# Patient Record
Sex: Female | Born: 1960
Health system: Southern US, Community
[De-identification: ages and names within clinical notes are randomized; demographics above are authoritative.]

## PROBLEM LIST (undated history)

## (undated) DIAGNOSIS — R0600 Dyspnea, unspecified: Secondary | ICD-10-CM

## (undated) DIAGNOSIS — E213 Hyperparathyroidism, unspecified: Secondary | ICD-10-CM

## (undated) DIAGNOSIS — T8859XA Other complications of anesthesia, initial encounter: Secondary | ICD-10-CM

## (undated) DIAGNOSIS — G8929 Other chronic pain: Secondary | ICD-10-CM

## (undated) DIAGNOSIS — N186 End stage renal disease: Secondary | ICD-10-CM

## (undated) DIAGNOSIS — K5900 Constipation, unspecified: Secondary | ICD-10-CM

## (undated) DIAGNOSIS — R600 Localized edema: Secondary | ICD-10-CM

## (undated) DIAGNOSIS — F419 Anxiety disorder, unspecified: Secondary | ICD-10-CM

## (undated) DIAGNOSIS — J309 Allergic rhinitis, unspecified: Secondary | ICD-10-CM

## (undated) DIAGNOSIS — N189 Chronic kidney disease, unspecified: Secondary | ICD-10-CM

## (undated) DIAGNOSIS — E1121 Type 2 diabetes mellitus with diabetic nephropathy: Secondary | ICD-10-CM

## (undated) DIAGNOSIS — N63 Unspecified lump in unspecified breast: Secondary | ICD-10-CM

## (undated) DIAGNOSIS — E119 Type 2 diabetes mellitus without complications: Secondary | ICD-10-CM

## (undated) DIAGNOSIS — E785 Hyperlipidemia, unspecified: Secondary | ICD-10-CM

## (undated) DIAGNOSIS — D649 Anemia, unspecified: Secondary | ICD-10-CM

## (undated) DIAGNOSIS — M549 Dorsalgia, unspecified: Secondary | ICD-10-CM

## (undated) DIAGNOSIS — R112 Nausea with vomiting, unspecified: Secondary | ICD-10-CM

## (undated) DIAGNOSIS — I10 Essential (primary) hypertension: Secondary | ICD-10-CM

## (undated) DIAGNOSIS — Z9889 Other specified postprocedural states: Secondary | ICD-10-CM

## (undated) DIAGNOSIS — M2550 Pain in unspecified joint: Secondary | ICD-10-CM

## (undated) DIAGNOSIS — S82831A Other fracture of upper and lower end of right fibula, initial encounter for closed fracture: Secondary | ICD-10-CM

## (undated) DIAGNOSIS — K219 Gastro-esophageal reflux disease without esophagitis: Secondary | ICD-10-CM

## (undated) DIAGNOSIS — Z89512 Acquired absence of left leg below knee: Secondary | ICD-10-CM

## (undated) DIAGNOSIS — I82409 Acute embolism and thrombosis of unspecified deep veins of unspecified lower extremity: Secondary | ICD-10-CM

## (undated) DIAGNOSIS — M1990 Unspecified osteoarthritis, unspecified site: Secondary | ICD-10-CM

## (undated) DIAGNOSIS — T7840XA Allergy, unspecified, initial encounter: Secondary | ICD-10-CM

## (undated) DIAGNOSIS — R011 Cardiac murmur, unspecified: Secondary | ICD-10-CM

## (undated) DIAGNOSIS — M14672 Charcot's joint, left ankle and foot: Secondary | ICD-10-CM

## (undated) DIAGNOSIS — I890 Lymphedema, not elsewhere classified: Secondary | ICD-10-CM

## (undated) DIAGNOSIS — Z8489 Family history of other specified conditions: Secondary | ICD-10-CM

## (undated) DIAGNOSIS — R001 Bradycardia, unspecified: Secondary | ICD-10-CM

## (undated) DIAGNOSIS — Z992 Dependence on renal dialysis: Secondary | ICD-10-CM

## (undated) DIAGNOSIS — H548 Legal blindness, as defined in USA: Secondary | ICD-10-CM

## (undated) DIAGNOSIS — N183 Chronic kidney disease, stage 3 unspecified: Secondary | ICD-10-CM

## (undated) DIAGNOSIS — E669 Obesity, unspecified: Secondary | ICD-10-CM

## (undated) DIAGNOSIS — I509 Heart failure, unspecified: Secondary | ICD-10-CM

## (undated) DIAGNOSIS — B351 Tinea unguium: Secondary | ICD-10-CM

## (undated) HISTORY — PX: VAGINAL HYSTERECTOMY: SHX2639

## (undated) HISTORY — DX: Hyperlipidemia, unspecified: E78.5

## (undated) HISTORY — PX: ABDOMINAL HYSTERECTOMY: SHX81

## (undated) HISTORY — DX: Type 2 diabetes mellitus with diabetic nephropathy: E11.21

## (undated) HISTORY — PX: CATARACT EXTRACTION: SUR2

## (undated) HISTORY — DX: Bradycardia, unspecified: R00.1

## (undated) HISTORY — DX: Allergy, unspecified, initial encounter: T78.40XA

## (undated) HISTORY — DX: Tinea unguium: B35.1

## (undated) HISTORY — DX: Dyspnea, unspecified: R06.00

## (undated) HISTORY — DX: Unspecified osteoarthritis, unspecified site: M19.90

## (undated) HISTORY — DX: Anxiety disorder, unspecified: F41.9

## (undated) HISTORY — DX: Localized edema: R60.0

## (undated) HISTORY — DX: Heart failure, unspecified: I50.9

## (undated) HISTORY — DX: Constipation, unspecified: K59.00

## (undated) HISTORY — DX: Dorsalgia, unspecified: M54.9

## (undated) HISTORY — DX: Hyperparathyroidism, unspecified: E21.3

## (undated) HISTORY — DX: Anemia, unspecified: D64.9

## (undated) HISTORY — PX: EYE SURGERY: SHX253

## (undated) HISTORY — DX: Chronic kidney disease, unspecified: N18.9

## (undated) HISTORY — DX: Lymphedema, not elsewhere classified: I89.0

## (undated) HISTORY — DX: Pain in unspecified joint: M25.50

## (undated) HISTORY — DX: Obesity, unspecified: E66.9

## (undated) HISTORY — DX: Allergic rhinitis, unspecified: J30.9

## (undated) HISTORY — DX: Gastro-esophageal reflux disease without esophagitis: K21.9

---

## 2002-12-05 ENCOUNTER — Emergency Department (HOSPITAL_COMMUNITY): Admission: EM | Admit: 2002-12-05 | Discharge: 2002-12-05 | Payer: Self-pay

## 2003-09-05 ENCOUNTER — Other Ambulatory Visit: Payer: Self-pay

## 2004-08-04 ENCOUNTER — Ambulatory Visit: Payer: Self-pay | Admitting: Family Medicine

## 2004-08-05 ENCOUNTER — Ambulatory Visit: Payer: Self-pay | Admitting: Family Medicine

## 2005-09-22 ENCOUNTER — Ambulatory Visit: Payer: Self-pay | Admitting: Internal Medicine

## 2005-09-22 ENCOUNTER — Inpatient Hospital Stay (HOSPITAL_COMMUNITY): Admission: EM | Admit: 2005-09-22 | Discharge: 2005-09-24 | Payer: Self-pay | Admitting: Emergency Medicine

## 2005-11-04 HISTORY — PX: EYE SURGERY: SHX253

## 2006-05-31 ENCOUNTER — Other Ambulatory Visit: Payer: Self-pay

## 2006-05-31 ENCOUNTER — Inpatient Hospital Stay: Payer: Self-pay | Admitting: Internal Medicine

## 2006-06-11 ENCOUNTER — Ambulatory Visit: Payer: Self-pay | Admitting: Ophthalmology

## 2006-06-18 ENCOUNTER — Ambulatory Visit: Payer: Self-pay | Admitting: Ophthalmology

## 2006-08-06 ENCOUNTER — Ambulatory Visit: Payer: Self-pay | Admitting: Ophthalmology

## 2006-08-23 ENCOUNTER — Emergency Department: Payer: Self-pay | Admitting: Emergency Medicine

## 2006-08-23 ENCOUNTER — Other Ambulatory Visit: Payer: Self-pay

## 2006-09-18 ENCOUNTER — Other Ambulatory Visit: Payer: Self-pay

## 2006-09-18 ENCOUNTER — Emergency Department: Payer: Self-pay

## 2007-03-11 ENCOUNTER — Ambulatory Visit: Payer: Self-pay | Admitting: Family Medicine

## 2007-03-19 ENCOUNTER — Ambulatory Visit: Payer: Self-pay | Admitting: Family Medicine

## 2007-04-03 ENCOUNTER — Ambulatory Visit: Payer: Self-pay | Admitting: Family Medicine

## 2007-05-28 ENCOUNTER — Ambulatory Visit: Payer: Self-pay | Admitting: Family Medicine

## 2007-07-09 DIAGNOSIS — I509 Heart failure, unspecified: Secondary | ICD-10-CM

## 2007-07-09 HISTORY — DX: Heart failure, unspecified: I50.9

## 2007-09-30 ENCOUNTER — Inpatient Hospital Stay: Payer: Self-pay | Admitting: *Deleted

## 2007-10-08 ENCOUNTER — Ambulatory Visit: Payer: Self-pay | Admitting: Gastroenterology

## 2007-12-11 ENCOUNTER — Ambulatory Visit: Payer: Self-pay | Admitting: Family Medicine

## 2008-02-02 ENCOUNTER — Ambulatory Visit: Payer: Self-pay | Admitting: Family Medicine

## 2008-05-17 ENCOUNTER — Observation Stay: Payer: Self-pay | Admitting: Internal Medicine

## 2008-05-17 ENCOUNTER — Other Ambulatory Visit: Payer: Self-pay

## 2009-02-19 ENCOUNTER — Emergency Department: Payer: Self-pay | Admitting: Emergency Medicine

## 2009-04-12 ENCOUNTER — Ambulatory Visit: Payer: Self-pay | Admitting: Family Medicine

## 2009-04-13 ENCOUNTER — Ambulatory Visit: Payer: Self-pay | Admitting: Family Medicine

## 2009-05-17 ENCOUNTER — Emergency Department: Payer: Self-pay | Admitting: Emergency Medicine

## 2010-03-08 ENCOUNTER — Inpatient Hospital Stay: Payer: Self-pay | Admitting: *Deleted

## 2010-06-11 ENCOUNTER — Ambulatory Visit: Payer: Self-pay | Admitting: Family Medicine

## 2011-09-12 ENCOUNTER — Emergency Department: Payer: Self-pay | Admitting: *Deleted

## 2011-10-21 ENCOUNTER — Emergency Department: Payer: Self-pay | Admitting: *Deleted

## 2012-04-22 ENCOUNTER — Ambulatory Visit: Payer: Self-pay | Admitting: Family Medicine

## 2012-08-05 ENCOUNTER — Ambulatory Visit: Payer: Self-pay | Admitting: Family Medicine

## 2012-08-21 ENCOUNTER — Emergency Department: Payer: Self-pay | Admitting: Emergency Medicine

## 2012-08-21 LAB — COMPREHENSIVE METABOLIC PANEL
Albumin: 3.2 g/dL — ABNORMAL LOW (ref 3.4–5.0)
Alkaline Phosphatase: 122 U/L (ref 50–136)
Anion Gap: 12 (ref 7–16)
BUN: 21 mg/dL — ABNORMAL HIGH (ref 7–18)
Bilirubin,Total: 0.4 mg/dL (ref 0.2–1.0)
Calcium, Total: 8.4 mg/dL — ABNORMAL LOW (ref 8.5–10.1)
Chloride: 107 mmol/L (ref 98–107)
Co2: 21 mmol/L (ref 21–32)
Creatinine: 1.5 mg/dL — ABNORMAL HIGH (ref 0.60–1.30)
EGFR (African American): 46 — ABNORMAL LOW
EGFR (Non-African Amer.): 40 — ABNORMAL LOW
Glucose: 189 mg/dL — ABNORMAL HIGH (ref 65–99)
Osmolality: 287 (ref 275–301)
Potassium: 4 mmol/L (ref 3.5–5.1)
SGOT(AST): 25 U/L (ref 15–37)
SGPT (ALT): 18 U/L (ref 12–78)
Sodium: 140 mmol/L (ref 136–145)
Total Protein: 7.7 g/dL (ref 6.4–8.2)

## 2012-08-21 LAB — CBC
HCT: 34 % — ABNORMAL LOW (ref 35.0–47.0)
HGB: 11.3 g/dL — ABNORMAL LOW (ref 12.0–16.0)
MCH: 28 pg (ref 26.0–34.0)
MCHC: 33.3 g/dL (ref 32.0–36.0)
MCV: 84 fL (ref 80–100)
Platelet: 282 10*3/uL (ref 150–440)
RBC: 4.04 10*6/uL (ref 3.80–5.20)
RDW: 14.4 % (ref 11.5–14.5)
WBC: 8.4 10*3/uL (ref 3.6–11.0)

## 2012-08-21 LAB — CK TOTAL AND CKMB (NOT AT ARMC)
CK, Total: 145 U/L (ref 21–215)
CK-MB: 1 ng/mL (ref 0.5–3.6)

## 2012-08-21 LAB — TROPONIN I: Troponin-I: <0.02 ng/mL

## 2012-08-21 LAB — PRO B NATRIURETIC PEPTIDE: B-Type Natriuretic Peptide: 535 pg/mL — ABNORMAL HIGH (ref 0–125)

## 2012-11-04 HISTORY — PX: BREAST BIOPSY: SHX20

## 2012-11-27 ENCOUNTER — Ambulatory Visit: Payer: Self-pay | Admitting: Nephrology

## 2012-12-04 ENCOUNTER — Ambulatory Visit: Payer: Self-pay | Admitting: Internal Medicine

## 2012-12-04 LAB — HEPATIC FUNCTION PANEL A (ARMC)
Albumin: 3 g/dL — ABNORMAL LOW (ref 3.4–5.0)
Alkaline Phosphatase: 90 U/L (ref 50–136)
Bilirubin, Direct: <0.05 mg/dL (ref 0.00–0.20)
Bilirubin,Total: 0.3 mg/dL (ref 0.2–1.0)
SGOT(AST): 19 U/L (ref 15–37)
SGPT (ALT): 18 U/L (ref 12–78)
Total Protein: 7.2 g/dL (ref 6.4–8.2)

## 2012-12-04 LAB — RETICULOCYTES
Absolute Retic Count: 0.0575 10*6/uL (ref 0.023–0.096)
Reticulocyte: 1.63 % (ref 0.5–2.2)

## 2012-12-04 LAB — IRON AND TIBC
Iron Bind.Cap.(Total): 229 ug/dL — ABNORMAL LOW (ref 250–450)
Iron Saturation: 17 %
Iron: 40 ug/dL — ABNORMAL LOW (ref 50–170)
Unbound Iron-Bind.Cap.: 189 ug/dL

## 2012-12-04 LAB — CBC CANCER CENTER
Basophil #: 0.1 x10 3/mm (ref 0.0–0.1)
Basophil %: 0.8 %
Eosinophil #: 0.1 x10 3/mm (ref 0.0–0.7)
Eosinophil %: 1.6 %
HCT: 28.9 % — ABNORMAL LOW (ref 35.0–47.0)
HGB: 9.4 g/dL — ABNORMAL LOW (ref 12.0–16.0)
Lymphocyte #: 2.4 x10 3/mm (ref 1.0–3.6)
Lymphocyte %: 33.8 %
MCH: 26.8 pg (ref 26.0–34.0)
MCHC: 32.6 g/dL (ref 32.0–36.0)
MCV: 82 fL (ref 80–100)
Monocyte #: 0.4 x10 3/mm (ref 0.2–0.9)
Monocyte %: 6.3 %
Neutrophil #: 4 x10 3/mm (ref 1.4–6.5)
Neutrophil %: 57.5 %
Platelet: 224 x10 3/mm (ref 150–440)
RBC: 3.52 10*6/uL — ABNORMAL LOW (ref 3.80–5.20)
RDW: 14.1 % (ref 11.5–14.5)
WBC: 7 x10 3/mm (ref 3.6–11.0)

## 2012-12-04 LAB — CREATININE, SERUM
Creatinine: 1.61 mg/dL — ABNORMAL HIGH (ref 0.60–1.30)
EGFR (African American): 42 — ABNORMAL LOW
EGFR (Non-African Amer.): 37 — ABNORMAL LOW

## 2012-12-04 LAB — FERRITIN: Ferritin (ARMC): 104 ng/mL (ref 8–388)

## 2012-12-04 LAB — LACTATE DEHYDROGENASE: LDH: 223 U/L (ref 81–246)

## 2012-12-05 ENCOUNTER — Ambulatory Visit: Payer: Self-pay | Admitting: Internal Medicine

## 2012-12-05 ENCOUNTER — Ambulatory Visit: Payer: Self-pay

## 2012-12-07 LAB — PROT IMMUNOELECTROPHORES(ARMC)

## 2012-12-30 ENCOUNTER — Ambulatory Visit: Payer: Self-pay

## 2013-01-02 ENCOUNTER — Ambulatory Visit: Payer: Self-pay | Admitting: Internal Medicine

## 2013-01-13 LAB — CBC CANCER CENTER
Basophil #: 0.1 x10 3/mm (ref 0.0–0.1)
Basophil %: 1.2 %
Eosinophil #: 0.1 x10 3/mm (ref 0.0–0.7)
Eosinophil %: 1.6 %
HCT: 30.4 % — ABNORMAL LOW (ref 35.0–47.0)
HGB: 10 g/dL — ABNORMAL LOW (ref 12.0–16.0)
Lymphocyte #: 2.6 x10 3/mm (ref 1.0–3.6)
Lymphocyte %: 36.2 %
MCH: 27.1 pg (ref 26.0–34.0)
MCHC: 32.7 g/dL (ref 32.0–36.0)
MCV: 83 fL (ref 80–100)
Monocyte #: 0.5 x10 3/mm (ref 0.2–0.9)
Monocyte %: 6.8 %
Neutrophil #: 3.9 x10 3/mm (ref 1.4–6.5)
Neutrophil %: 54.2 %
Platelet: 203 x10 3/mm (ref 150–440)
RBC: 3.67 10*6/uL — ABNORMAL LOW (ref 3.80–5.20)
RDW: 14.6 % — ABNORMAL HIGH (ref 11.5–14.5)
WBC: 7.2 x10 3/mm (ref 3.6–11.0)

## 2013-01-27 DIAGNOSIS — D638 Anemia in other chronic diseases classified elsewhere: Secondary | ICD-10-CM | POA: Insufficient documentation

## 2013-02-02 ENCOUNTER — Ambulatory Visit: Payer: Self-pay | Admitting: Internal Medicine

## 2013-03-02 ENCOUNTER — Emergency Department: Payer: Self-pay | Admitting: Emergency Medicine

## 2013-03-04 ENCOUNTER — Ambulatory Visit: Payer: Self-pay | Admitting: Internal Medicine

## 2013-03-30 ENCOUNTER — Inpatient Hospital Stay: Payer: Self-pay | Admitting: Student

## 2013-03-30 LAB — CK TOTAL AND CKMB (NOT AT ARMC)
CK, Total: 225 U/L — ABNORMAL HIGH (ref 21–215)
CK, Total: 159 U/L (ref 21–215)
CK, Total: 150 U/L (ref 21–215)
CK-MB: 1 ng/mL (ref 0.5–3.6)
CK-MB: 1 ng/mL (ref 0.5–3.6)
CK-MB: 0.9 ng/mL (ref 0.5–3.6)

## 2013-03-30 LAB — TROPONIN I
Troponin-I: <0.02 ng/mL
Troponin-I: <0.02 ng/mL
Troponin-I: <0.02 ng/mL

## 2013-03-30 LAB — BASIC METABOLIC PANEL
Anion Gap: 5 — ABNORMAL LOW (ref 7–16)
BUN: 26 mg/dL — ABNORMAL HIGH (ref 7–18)
Calcium, Total: 8.6 mg/dL (ref 8.5–10.1)
Chloride: 109 mmol/L — ABNORMAL HIGH (ref 98–107)
Co2: 26 mmol/L (ref 21–32)
Creatinine: 1.63 mg/dL — ABNORMAL HIGH (ref 0.60–1.30)
EGFR (African American): 42 — ABNORMAL LOW
EGFR (Non-African Amer.): 36 — ABNORMAL LOW
Glucose: 249 mg/dL — ABNORMAL HIGH (ref 65–99)
Osmolality: 293 (ref 275–301)
Potassium: 3.5 mmol/L (ref 3.5–5.1)
Sodium: 140 mmol/L (ref 136–145)

## 2013-03-30 LAB — CBC
HCT: 29.9 % — ABNORMAL LOW (ref 35.0–47.0)
HGB: 9.8 g/dL — ABNORMAL LOW (ref 12.0–16.0)
MCH: 27.1 pg (ref 26.0–34.0)
MCHC: 32.7 g/dL (ref 32.0–36.0)
MCV: 83 fL (ref 80–100)
Platelet: 219 10*3/uL (ref 150–440)
RBC: 3.61 10*6/uL — ABNORMAL LOW (ref 3.80–5.20)
RDW: 14.5 % (ref 11.5–14.5)
WBC: 8.7 10*3/uL (ref 3.6–11.0)

## 2013-03-30 LAB — HEMOGLOBIN A1C: Hemoglobin A1C: 9 % — ABNORMAL HIGH (ref 4.2–6.3)

## 2013-03-30 LAB — PRO B NATRIURETIC PEPTIDE: B-Type Natriuretic Peptide: 742 pg/mL — ABNORMAL HIGH (ref 0–125)

## 2013-03-31 LAB — CBC WITH DIFFERENTIAL/PLATELET
Basophil #: 0 10*3/uL (ref 0.0–0.1)
Basophil %: 0.8 %
Eosinophil #: 0.1 10*3/uL (ref 0.0–0.7)
Eosinophil %: 1.8 %
HCT: 25.1 % — ABNORMAL LOW (ref 35.0–47.0)
HGB: 8.3 g/dL — ABNORMAL LOW (ref 12.0–16.0)
Lymphocyte #: 2.2 10*3/uL (ref 1.0–3.6)
Lymphocyte %: 35.2 %
MCH: 27.4 pg (ref 26.0–34.0)
MCHC: 33.1 g/dL (ref 32.0–36.0)
MCV: 83 fL (ref 80–100)
Monocyte #: 0.6 x10 3/mm (ref 0.2–0.9)
Monocyte %: 8.7 %
Neutrophil #: 3.4 10*3/uL (ref 1.4–6.5)
Neutrophil %: 53.5 %
Platelet: 189 10*3/uL (ref 150–440)
RBC: 3.02 10*6/uL — ABNORMAL LOW (ref 3.80–5.20)
RDW: 14.6 % — ABNORMAL HIGH (ref 11.5–14.5)
WBC: 6.3 10*3/uL (ref 3.6–11.0)

## 2013-03-31 LAB — BASIC METABOLIC PANEL
Anion Gap: 5 — ABNORMAL LOW (ref 7–16)
BUN: 24 mg/dL — ABNORMAL HIGH (ref 7–18)
Calcium, Total: 8.1 mg/dL — ABNORMAL LOW (ref 8.5–10.1)
Chloride: 110 mmol/L — ABNORMAL HIGH (ref 98–107)
Co2: 27 mmol/L (ref 21–32)
Creatinine: 1.5 mg/dL — ABNORMAL HIGH (ref 0.60–1.30)
EGFR (African American): 46 — ABNORMAL LOW
EGFR (Non-African Amer.): 40 — ABNORMAL LOW
Glucose: 157 mg/dL — ABNORMAL HIGH (ref 65–99)
Osmolality: 290 (ref 275–301)
Potassium: 3.7 mmol/L (ref 3.5–5.1)
Sodium: 142 mmol/L (ref 136–145)

## 2013-04-01 LAB — BASIC METABOLIC PANEL
Anion Gap: 4 — ABNORMAL LOW (ref 7–16)
BUN: 19 mg/dL — ABNORMAL HIGH (ref 7–18)
Calcium, Total: 8.9 mg/dL (ref 8.5–10.1)
Chloride: 108 mmol/L — ABNORMAL HIGH (ref 98–107)
Co2: 29 mmol/L (ref 21–32)
Creatinine: 1.49 mg/dL — ABNORMAL HIGH (ref 0.60–1.30)
EGFR (African American): 47 — ABNORMAL LOW
EGFR (Non-African Amer.): 40 — ABNORMAL LOW
Glucose: 152 mg/dL — ABNORMAL HIGH (ref 65–99)
Osmolality: 286 (ref 275–301)
Potassium: 3.7 mmol/L (ref 3.5–5.1)
Sodium: 141 mmol/L (ref 136–145)

## 2013-04-02 LAB — BASIC METABOLIC PANEL
Anion Gap: 3 — ABNORMAL LOW (ref 7–16)
BUN: 19 mg/dL — ABNORMAL HIGH (ref 7–18)
Calcium, Total: 8.6 mg/dL (ref 8.5–10.1)
Chloride: 109 mmol/L — ABNORMAL HIGH (ref 98–107)
Co2: 29 mmol/L (ref 21–32)
Creatinine: 1.53 mg/dL — ABNORMAL HIGH (ref 0.60–1.30)
EGFR (African American): 45 — ABNORMAL LOW
EGFR (Non-African Amer.): 39 — ABNORMAL LOW
Glucose: 157 mg/dL — ABNORMAL HIGH (ref 65–99)
Osmolality: 287 (ref 275–301)
Potassium: 3.8 mmol/L (ref 3.5–5.1)
Sodium: 141 mmol/L (ref 136–145)

## 2013-04-05 DIAGNOSIS — K219 Gastro-esophageal reflux disease without esophagitis: Secondary | ICD-10-CM | POA: Insufficient documentation

## 2013-05-03 ENCOUNTER — Ambulatory Visit: Payer: Self-pay | Admitting: General Surgery

## 2013-05-05 ENCOUNTER — Ambulatory Visit: Payer: Self-pay | Admitting: General Surgery

## 2013-05-14 DIAGNOSIS — R001 Bradycardia, unspecified: Secondary | ICD-10-CM | POA: Insufficient documentation

## 2013-06-17 ENCOUNTER — Encounter: Payer: Self-pay | Admitting: *Deleted

## 2013-08-12 ENCOUNTER — Emergency Department: Payer: Self-pay | Admitting: Emergency Medicine

## 2013-10-04 ENCOUNTER — Ambulatory Visit: Payer: Self-pay | Admitting: Family Medicine

## 2013-10-21 ENCOUNTER — Observation Stay: Payer: Self-pay | Admitting: Internal Medicine

## 2013-10-21 LAB — CK TOTAL AND CKMB (NOT AT ARMC)
CK, Total: 224 U/L — ABNORMAL HIGH (ref 21–215)
CK-MB: 1.9 ng/mL (ref 0.5–3.6)

## 2013-10-21 LAB — CBC
HCT: 32.8 % — ABNORMAL LOW (ref 35.0–47.0)
HGB: 10.2 g/dL — ABNORMAL LOW (ref 12.0–16.0)
MCH: 26.3 pg (ref 26.0–34.0)
MCHC: 31 g/dL — ABNORMAL LOW (ref 32.0–36.0)
MCV: 85 fL (ref 80–100)
Platelet: 225 10*3/uL (ref 150–440)
RBC: 3.87 10*6/uL (ref 3.80–5.20)
RDW: 14.2 % (ref 11.5–14.5)
WBC: 6.4 10*3/uL (ref 3.6–11.0)

## 2013-10-21 LAB — BASIC METABOLIC PANEL
Anion Gap: 6 — ABNORMAL LOW (ref 7–16)
Anion Gap: 5 — ABNORMAL LOW (ref 7–16)
BUN: 37 mg/dL — ABNORMAL HIGH (ref 7–18)
BUN: 37 mg/dL — ABNORMAL HIGH (ref 7–18)
Calcium, Total: 9 mg/dL (ref 8.5–10.1)
Calcium, Total: 8.9 mg/dL (ref 8.5–10.1)
Chloride: 106 mmol/L (ref 98–107)
Chloride: 108 mmol/L — ABNORMAL HIGH (ref 98–107)
Co2: 23 mmol/L (ref 21–32)
Co2: 24 mmol/L (ref 21–32)
Creatinine: 2.03 mg/dL — ABNORMAL HIGH (ref 0.60–1.30)
Creatinine: 1.96 mg/dL — ABNORMAL HIGH (ref 0.60–1.30)
EGFR (African American): 32 — ABNORMAL LOW
EGFR (African American): 33 — ABNORMAL LOW
EGFR (Non-African Amer.): 27 — ABNORMAL LOW
EGFR (Non-African Amer.): 29 — ABNORMAL LOW
Glucose: 214 mg/dL — ABNORMAL HIGH (ref 65–99)
Glucose: 156 mg/dL — ABNORMAL HIGH (ref 65–99)
Osmolality: 285 (ref 275–301)
Osmolality: 286 (ref 275–301)
Potassium: 5.3 mmol/L — ABNORMAL HIGH (ref 3.5–5.1)
Potassium: 4.9 mmol/L (ref 3.5–5.1)
Sodium: 135 mmol/L — ABNORMAL LOW (ref 136–145)
Sodium: 137 mmol/L (ref 136–145)

## 2013-10-21 LAB — TROPONIN I
Troponin-I: <0.02 ng/mL
Troponin-I: <0.02 ng/mL

## 2013-10-22 LAB — TROPONIN I: Troponin-I: <0.02 ng/mL

## 2013-10-22 LAB — BASIC METABOLIC PANEL
Anion Gap: 3 — ABNORMAL LOW (ref 7–16)
BUN: 36 mg/dL — ABNORMAL HIGH (ref 7–18)
Calcium, Total: 8.3 mg/dL — ABNORMAL LOW (ref 8.5–10.1)
Chloride: 111 mmol/L — ABNORMAL HIGH (ref 98–107)
Co2: 26 mmol/L (ref 21–32)
Creatinine: 1.9 mg/dL — ABNORMAL HIGH (ref 0.60–1.30)
EGFR (African American): 35 — ABNORMAL LOW
EGFR (Non-African Amer.): 30 — ABNORMAL LOW
Glucose: 134 mg/dL — ABNORMAL HIGH (ref 65–99)
Osmolality: 290 (ref 275–301)
Potassium: 4.8 mmol/L (ref 3.5–5.1)
Sodium: 140 mmol/L (ref 136–145)

## 2013-10-22 LAB — LIPID PANEL
Cholesterol: 144 mg/dL (ref 0–200)
HDL Cholesterol: 41 mg/dL (ref 40–60)
Ldl Cholesterol, Calc: 87 mg/dL (ref 0–100)
Triglycerides: 81 mg/dL (ref 0–200)
VLDL Cholesterol, Calc: 16 mg/dL (ref 5–40)

## 2013-10-22 LAB — CK TOTAL AND CKMB (NOT AT ARMC)
CK, Total: 180 U/L (ref 21–215)
CK, Total: 197 U/L (ref 21–215)
CK-MB: 1.5 ng/mL (ref 0.5–3.6)
CK-MB: 1.7 ng/mL (ref 0.5–3.6)

## 2013-11-04 ENCOUNTER — Ambulatory Visit: Payer: Self-pay | Admitting: Family Medicine

## 2013-11-05 DIAGNOSIS — F419 Anxiety disorder, unspecified: Secondary | ICD-10-CM | POA: Insufficient documentation

## 2013-11-19 ENCOUNTER — Observation Stay: Payer: Self-pay | Admitting: Internal Medicine

## 2013-11-19 LAB — PRO B NATRIURETIC PEPTIDE: B-Type Natriuretic Peptide: 185 pg/mL — ABNORMAL HIGH (ref 0–125)

## 2013-11-19 LAB — HEPATIC FUNCTION PANEL A (ARMC)
Albumin: 3.2 g/dL — ABNORMAL LOW (ref 3.4–5.0)
Alkaline Phosphatase: 95 U/L
Bilirubin, Direct: <0.1 mg/dL (ref 0.00–0.20)
Bilirubin,Total: 0.3 mg/dL (ref 0.2–1.0)
SGOT(AST): 19 U/L (ref 15–37)
SGPT (ALT): 20 U/L (ref 12–78)
Total Protein: 7.9 g/dL (ref 6.4–8.2)

## 2013-11-19 LAB — CBC
HCT: 29 % — ABNORMAL LOW (ref 35.0–47.0)
HGB: 9.5 g/dL — ABNORMAL LOW (ref 12.0–16.0)
MCH: 27.4 pg (ref 26.0–34.0)
MCHC: 32.7 g/dL (ref 32.0–36.0)
MCV: 84 fL (ref 80–100)
Platelet: 239 10*3/uL (ref 150–440)
RBC: 3.46 10*6/uL — ABNORMAL LOW (ref 3.80–5.20)
RDW: 14.2 % (ref 11.5–14.5)
WBC: 7.4 10*3/uL (ref 3.6–11.0)

## 2013-11-19 LAB — LIPASE, BLOOD: Lipase: 103 U/L (ref 73–393)

## 2013-11-19 LAB — BASIC METABOLIC PANEL
Anion Gap: 6 — ABNORMAL LOW (ref 7–16)
BUN: 28 mg/dL — ABNORMAL HIGH (ref 7–18)
Calcium, Total: 8.7 mg/dL (ref 8.5–10.1)
Chloride: 106 mmol/L (ref 98–107)
Co2: 24 mmol/L (ref 21–32)
Creatinine: 2.17 mg/dL — ABNORMAL HIGH (ref 0.60–1.30)
EGFR (African American): 29 — ABNORMAL LOW
EGFR (Non-African Amer.): 25 — ABNORMAL LOW
Glucose: 135 mg/dL — ABNORMAL HIGH (ref 65–99)
Osmolality: 279 (ref 275–301)
Potassium: 4.8 mmol/L (ref 3.5–5.1)
Sodium: 136 mmol/L (ref 136–145)

## 2013-11-19 LAB — TROPONIN I
Troponin-I: <0.02 ng/mL
Troponin-I: <0.02 ng/mL
Troponin-I: <0.02 ng/mL

## 2013-11-20 LAB — TSH: Thyroid Stimulating Horm: 1.42 u[IU]/mL

## 2013-11-20 LAB — BASIC METABOLIC PANEL
Anion Gap: 4 — ABNORMAL LOW (ref 7–16)
BUN: 32 mg/dL — ABNORMAL HIGH (ref 7–18)
Calcium, Total: 8.3 mg/dL — ABNORMAL LOW (ref 8.5–10.1)
Chloride: 106 mmol/L (ref 98–107)
Co2: 25 mmol/L (ref 21–32)
Creatinine: 2.22 mg/dL — ABNORMAL HIGH (ref 0.60–1.30)
EGFR (African American): 29 — ABNORMAL LOW
EGFR (Non-African Amer.): 25 — ABNORMAL LOW
Glucose: 138 mg/dL — ABNORMAL HIGH (ref 65–99)
Osmolality: 279 (ref 275–301)
Potassium: 4.8 mmol/L (ref 3.5–5.1)
Sodium: 135 mmol/L — ABNORMAL LOW (ref 136–145)

## 2013-11-20 LAB — HEMOGLOBIN A1C: Hemoglobin A1C: 7.7 % — ABNORMAL HIGH (ref 4.2–6.3)

## 2013-11-20 LAB — LIPID PANEL
Cholesterol: 157 mg/dL (ref 0–200)
HDL Cholesterol: 38 mg/dL — ABNORMAL LOW (ref 40–60)
Ldl Cholesterol, Calc: 96 mg/dL (ref 0–100)
Triglycerides: 114 mg/dL (ref 0–200)
VLDL Cholesterol, Calc: 23 mg/dL (ref 5–40)

## 2013-11-20 LAB — MAGNESIUM: Magnesium: 2 mg/dL

## 2013-11-21 LAB — BASIC METABOLIC PANEL
Anion Gap: 6 — ABNORMAL LOW (ref 7–16)
BUN: 34 mg/dL — ABNORMAL HIGH (ref 7–18)
Calcium, Total: 8.4 mg/dL — ABNORMAL LOW (ref 8.5–10.1)
Chloride: 107 mmol/L (ref 98–107)
Co2: 24 mmol/L (ref 21–32)
Creatinine: 2.15 mg/dL — ABNORMAL HIGH (ref 0.60–1.30)
EGFR (African American): 30 — ABNORMAL LOW
EGFR (Non-African Amer.): 26 — ABNORMAL LOW
Glucose: 95 mg/dL (ref 65–99)
Osmolality: 281 (ref 275–301)
Potassium: 4.6 mmol/L (ref 3.5–5.1)
Sodium: 137 mmol/L (ref 136–145)

## 2014-02-10 ENCOUNTER — Ambulatory Visit: Payer: Self-pay | Admitting: Family Medicine

## 2014-02-22 DIAGNOSIS — J309 Allergic rhinitis, unspecified: Secondary | ICD-10-CM | POA: Insufficient documentation

## 2014-02-22 DIAGNOSIS — B351 Tinea unguium: Secondary | ICD-10-CM | POA: Insufficient documentation

## 2014-02-24 DIAGNOSIS — E213 Hyperparathyroidism, unspecified: Secondary | ICD-10-CM | POA: Insufficient documentation

## 2014-04-15 ENCOUNTER — Emergency Department: Payer: Self-pay | Admitting: Emergency Medicine

## 2014-06-26 DIAGNOSIS — N179 Acute kidney failure, unspecified: Secondary | ICD-10-CM | POA: Insufficient documentation

## 2014-06-26 DIAGNOSIS — N183 Chronic kidney disease, stage 3 unspecified: Secondary | ICD-10-CM | POA: Insufficient documentation

## 2014-07-21 ENCOUNTER — Ambulatory Visit: Payer: Self-pay | Admitting: Internal Medicine

## 2014-08-04 ENCOUNTER — Ambulatory Visit: Payer: Self-pay | Admitting: Internal Medicine

## 2014-08-04 LAB — CBC CANCER CENTER
Basophil #: 0.1 x10 3/mm (ref 0.0–0.1)
Basophil %: 1.2 %
Eosinophil #: 0.4 x10 3/mm (ref 0.0–0.7)
Eosinophil %: 4.4 %
HCT: 33 % — ABNORMAL LOW (ref 35.0–47.0)
HGB: 10.3 g/dL — ABNORMAL LOW (ref 12.0–16.0)
Lymphocyte #: 2.1 x10 3/mm (ref 1.0–3.6)
Lymphocyte %: 25.7 %
MCH: 26.8 pg (ref 26.0–34.0)
MCHC: 31.4 g/dL — ABNORMAL LOW (ref 32.0–36.0)
MCV: 85 fL (ref 80–100)
Monocyte #: 0.5 x10 3/mm (ref 0.2–0.9)
Monocyte %: 6.2 %
Neutrophil #: 5.2 x10 3/mm (ref 1.4–6.5)
Neutrophil %: 62.5 %
Platelet: 221 x10 3/mm (ref 150–440)
RBC: 3.87 10*6/uL (ref 3.80–5.20)
RDW: 15.1 % — ABNORMAL HIGH (ref 11.5–14.5)
WBC: 8.2 x10 3/mm (ref 3.6–11.0)

## 2014-08-04 LAB — IRON AND TIBC
Iron Bind.Cap.(Total): 215 ug/dL — ABNORMAL LOW (ref 250–450)
Iron Saturation: 20 %
Iron: 42 ug/dL — ABNORMAL LOW (ref 50–170)
Unbound Iron-Bind.Cap.: 173 ug/dL

## 2014-08-04 LAB — CREATININE, SERUM
Creatinine: 1.91 mg/dL — ABNORMAL HIGH (ref 0.60–1.30)
EGFR (African American): 35 — ABNORMAL LOW
EGFR (Non-African Amer.): 29 — ABNORMAL LOW

## 2014-08-04 LAB — FERRITIN: Ferritin (ARMC): 176 ng/mL (ref 8–388)

## 2014-08-14 DIAGNOSIS — K529 Noninfective gastroenteritis and colitis, unspecified: Secondary | ICD-10-CM | POA: Insufficient documentation

## 2014-08-14 DIAGNOSIS — I10 Essential (primary) hypertension: Secondary | ICD-10-CM | POA: Insufficient documentation

## 2014-08-14 DIAGNOSIS — N3000 Acute cystitis without hematuria: Secondary | ICD-10-CM | POA: Insufficient documentation

## 2014-09-04 ENCOUNTER — Ambulatory Visit: Payer: Self-pay | Admitting: Internal Medicine

## 2014-09-09 LAB — CREATININE, SERUM
Creatinine: 1.8 mg/dL — ABNORMAL HIGH (ref 0.60–1.30)
EGFR (African American): 38 — ABNORMAL LOW
EGFR (Non-African Amer.): 31 — ABNORMAL LOW

## 2014-09-09 LAB — PHOSPHORUS: Phosphorus: 4.5 mg/dL (ref 2.5–4.9)

## 2014-09-09 LAB — CALCIUM: Calcium, Total: 8.8 mg/dL (ref 8.5–10.1)

## 2014-09-09 LAB — CBC CANCER CENTER
Basophil #: 0.1 x10 3/mm (ref 0.0–0.1)
Basophil %: 1 %
Eosinophil #: 0.4 x10 3/mm (ref 0.0–0.7)
Eosinophil %: 5.8 %
HCT: 29.6 % — ABNORMAL LOW (ref 35.0–47.0)
HGB: 9.4 g/dL — ABNORMAL LOW (ref 12.0–16.0)
Lymphocyte #: 2 x10 3/mm (ref 1.0–3.6)
Lymphocyte %: 30.6 %
MCH: 27.7 pg (ref 26.0–34.0)
MCHC: 31.9 g/dL — ABNORMAL LOW (ref 32.0–36.0)
MCV: 87 fL (ref 80–100)
Monocyte #: 0.5 x10 3/mm (ref 0.2–0.9)
Monocyte %: 7.2 %
Neutrophil #: 3.6 x10 3/mm (ref 1.4–6.5)
Neutrophil %: 55.4 %
Platelet: 210 x10 3/mm (ref 150–440)
RBC: 3.41 10*6/uL — ABNORMAL LOW (ref 3.80–5.20)
RDW: 15.1 % — ABNORMAL HIGH (ref 11.5–14.5)
WBC: 6.4 x10 3/mm (ref 3.6–11.0)

## 2014-10-04 ENCOUNTER — Ambulatory Visit: Payer: Self-pay | Admitting: Internal Medicine

## 2014-10-07 LAB — IRON AND TIBC
Iron Bind.Cap.(Total): 202 ug/dL — ABNORMAL LOW (ref 250–450)
Iron Saturation: 19 %
Iron: 39 ug/dL — ABNORMAL LOW (ref 50–170)
Unbound Iron-Bind.Cap.: 163 ug/dL

## 2014-10-07 LAB — CANCER CENTER HEMOGLOBIN: HGB: 9.3 g/dL — ABNORMAL LOW (ref 12.0–16.0)

## 2014-10-10 LAB — PROT IMMUNOELECTROPHORES(ARMC)

## 2014-10-10 LAB — KAPPA/LAMBDA FREE LIGHT CHAINS (ARMC)

## 2014-11-04 ENCOUNTER — Ambulatory Visit: Payer: Self-pay | Admitting: Internal Medicine

## 2014-12-13 ENCOUNTER — Ambulatory Visit: Payer: Self-pay | Admitting: Internal Medicine

## 2015-02-24 NOTE — H&P (Signed)
PATIENT NAME:  Rachel Holmes, Rachel Holmes MR#:  [id number] DATE OF BIRTH:  November 05, 1960  DATE OF ADMISSION:  10/21/2013  REFERRING PHYSICIAN: Dr. Beather Arbour.   PRIMARY CARE PHYSICIAN: Dr. Salome Holmes.   CHIEF COMPLAINT: Chest pain.   HISTORY OF PRESENT ILLNESS: This is a 54 year old African American female with history of insulin-dependent diabetes poorly controlled with associated retinopathy and neuropathy, diastolic congestive heart failure, hyperlipidemia, hypertension, presenting with 1 day duration of chest pain. Said that she awoke around 9 a.m. and had "eye floaters." This was followed by burning chest pain rated 6 to 7 out of 10 in intensity, retrosternal, nonradiating, no worsening or relieving factors which was constant since around 9 a.m. She had associated palpitations and dyspnea on exertion. She states with all of the above symptoms, she eventually took her blood pressure and noticed it to be 220s/110s. She then presented to the Emergency Department for further workup and evaluation. In the Emergency Department, her blood pressure has improved after the addition of nitro paste and currently, she is asymptomatic, resting comfortably in bed without further complaints.   REVIEW OF SYSTEMS:   CONSTITUTIONAL: Denies fever, fatigue, weakness, pain.  EYES: Blurred vision, double vision, eye pain.  EARS, NOSE, THROAT: Denies tinnitus, ear pain, hearing loss.  RESPIRATORY: Denies cough, wheeze, shortness of breath.  CARDIOVASCULAR: Positive for chest pain as described above. Denies any orthopnea, edema. Positive for palpitations as described above.  GASTROINTESTINAL: Denies nausea, vomiting, diarrhea, abdominal pain.  GENITOURINARY: Denies dysuria, hematuria.  ENDOCRINE: Denies nocturia or polyuria.  HEMATOLOGIC AND LYMPHATIC: Denies easy bruising or bleeding.  SKIN: Denies rash or lesion.  MUSCULOSKELETAL: Denies pain in neck, back, shoulder, knees, hips or arthritic symptoms.  NEUROLOGIC: Positive for  numbness in hands and feet bilaterally which is chronic in nature. Denies any paralysis.  PSYCHIATRIC: Denies any anxiety or depressive symptoms.   Otherwise, full review of systems performed by me is negative.   PAST MEDICAL HISTORY: Insulin-dependent type 2 diabetes complicated by retinopathy and neuropathy, history of DVT approximately 20 years ago, diastolic congestive heart failure, hyperlipidemia, hypertension, chronic kidney disease.   SOCIAL HISTORY: Denies alcohol, tobacco or drug usage.   FAMILY HISTORY: Positive for congestive heart failure.   ALLERGIES: CODEINE, LOVASTATIN, OXYCONTIN, PENICILLIN, PERCOCET, QUINAPRIL AND VICODIN.   HOME MEDICATIONS: Include Norvasc 10 mg p.o. daily, aspirin 325 mg p.o. daily, clonidine 0.3 mg 1 tablet b.i.d., ferrous sulfate 325 mg b.i.d., Lasix 20 mg p.o. daily, gabapentin 600 mg p.o. at bedtime, glipizide 10 mg 2 tablets in the morning and 1 tablet in the evening, hydralazine 100 mg p.o. b.i.d., Imdur 60 mg extended release p.o. daily, Januvia 50 mg p.o. daily, Levemir 36 units subcutaneous injection at bedtime, losartan 100 mg p.o. daily, Prilosec 40 mg p.o. b.i.d.    PHYSICAL EXAMINATION:  VITAL SIGNS: Temperature 97.7, heart rate 91, respirations 18, blood pressure on arrival 226/86, trending down to 185/78, saturating 97% on room air. Weight 136.1 kg, BMI 45.6.  GENERAL: Obese, African American female, currently in no acute distress.  HEAD: Normocephalic, atraumatic.  EYES: Pupils equal, round and reactive to light. Extraocular muscles intact. No scleral icterus.  MOUTH: Moist mucosal membranes. Dentition intact. No abscess noted.  EARS, NOSE, THROAT: Throat clear without exudates. No external lesions.  NECK: Supple. No thyromegaly. No nodules. No JVD.  PULMONARY: Clear to auscultation bilaterally. No wheezes, rubs or rhonchi. No use of accessory muscles. Good respiratory effort.  CHEST: Nontender to palpation.  CARDIOVASCULAR: S1, S2,  regular rate  and rhythm with a 3/6 systolic ejection murmur. Trace pedal edema. Pedal pulses 2+ bilaterally.  GASTROINTESTINAL: Soft, nontender, nondistended. No masses. Positive bowel sounds. No hepatosplenomegaly. Obese.  MUSCULOSKELETAL: No swelling, clubbing. Positive for edema as described above. Range of motion full in all extremities.  NEUROLOGIC: Cranial nerves II through XII intact. No gross focal neurological deficits. Sensation intact. Reflexes intact.  SKIN: No ulcerations, lesions, rashes, cyanosis. Skin warm, dry. Turgor is intact.  PSYCHIATRIC: Mood and affect are within normal limits. Awake, alert, oriented x 3. Insight and judgment intact.   LABORATORY DATA: Sodium 137, potassium 4.9, chloride 108, bicarb 24, BUN 37, creatinine 1.96, glucose 156. Apparent baseline creatinine is around 1.5. Troponin less than 0.02. This is x 2. CK 224, CK-MB 1.9. WBC 6.4, hemoglobin 10.2, platelets of 225. EKG performed revealing normal sinus rhythm, though minimal voltage criteria for LVH. CT head performed revealing no acute intracranial process. Chest x-ray performed revealing no acute cardiopulmonary process.   ASSESSMENT AND PLAN: A 54 year old African American female with history of diabetes poorly controlled, diastolic congestive heart failure, hypertension, hyperlipidemia. Presenting with 1 day duration of chest pain. Found to be markedly hypertensive.  1. Hypertensive urgency: Improved in the Emergency Department after the addition of nitro paste. Will add p.r.n. hydralazine 10 mg intravenous q.4 hours as needed for a systolic blood pressure greater than 99991111 or diastolic blood pressure greater than 100. In the meantime, continue her home doses of p.o. Norvasc, clonidine, hydralazine, losartan and Imdur. If her blood pressure remains elevated and she requires multiple doses of p.r.n. hydralazine intravenous, she will need adjustments of her home medications.  2. Type 2 diabetes, insulin dependent,  poorly controlled, complicated by retinopathy and neuropathy: Continue Levemir. Hold p.o. agents and add insulin sliding scale with q.6 hour Accu-Cheks.  3. Chest pain: Admit to observation on telemetry. Trend cardiac enzymes. This has already been negative x 2. Chest pain seems unlikely to be cardiovascular in nature.  4. Gastroesophageal reflux disease: Continue with pantoprazole.  5. Neuropathy: Continue with gabapentin.  6. Venous thromboembolism prophylaxis with heparin subcutaneous.   The patient is FULL CODE.   TIME SPENT: 45 minutes.   ____________________________ Aaron Mose. Hower, MD dkh:gb D: 10/21/2013 22:15:42 ET T: 10/21/2013 22:33:22 ET JOB#: [id number]  cc: Aaron Mose. Hower, MD, <Dictator> DAVID Woodfin Ganja MD ELECTRONICALLY SIGNED 10/23/2013 2:45

## 2015-02-24 NOTE — Consult Note (Signed)
PATIENT NAME:  Rachel Holmes, Rachel Holmes MR#:  [id number] DATE OF BIRTH:  1961/09/14  DATE OF CONSULTATION:  03/30/2013  CONSULTING PHYSICIAN:  Corey Skains, MD  REQUESTING PHYSICIAN:  Dr. Tressia Miners   REASON FOR CONSULTATION: Unstable angina, congestive heart failure, diabetes, bradycardia and anemia.   CHIEF COMPLAINT: "I'm short of breath."   HISTORY OF PRESENT ILLNESS:  This is a 54 year old female with a history of diastolic dysfunction, congestive heart failure, with chronic kidney disease and significant anemia, who has had substernal chest pain and pressure over the last several days, increasing in nature, consistent with [place] class IV anginal equivalent, with also lower extremity edema and pulmonary edema consistent with [place] Heart Association class IV congestive heart failure. An EKG has shown normal sinus rhythm with nonspecific ST and T-wave changes, and troponin is normal, without evidence of myocardial infarction. The patient has had some bradycardia, likely secondary to metoprolol/clonidine combination. She does have anemia, with a hemoglobin of 9.8, chronic kidney disease, with a creatinine of 1.6, most consistent with causing exacerbation of diastolic dysfunction heart failure. Diabetes and hyperlipidemia have been well- controlled. The remainder of review of systems negative for syncope, dizziness, nausea, diaphoresis, frequent urination, urination at night, muscle weakness, numbness, anxiety, depression, skin lesions, skin rashes, nausea, vomiting, diarrhea.   PAST MEDICAL HISTORY: 1.  Diastolic dysfunction congestive heart failure.  2.  Diabetes.  3.  Hypertension. 4.  Hyperlipidemia.  5.  Chronic kidney disease.  6.  Bradycardia. 7.  Anemia.   FAMILY HISTORY:  Father and sister had early onset of cardiovascular disease.   SOCIAL HISTORY:  She denies alcohol or tobacco use.   ALLERGIES AND MEDICATIONS:  As listed.   PHYSICAL EXAMINATION: VITAL SIGNS:  Blood  pressure is 146/68 bilaterally, heart rate 72 upright, reclining and regular.  GENERAL:  She is a well-appearing, elderly female in no acute distress.  HEAD, EYES, EARS, NOSE, THROAT:  No icterus, thyromegaly, ulcers, hemorrhage or xanthelasma.  CARDIOVASCULAR:  Regular rate and rhythm. Normal S1, S2. A 2/6 apical murmur, consistent with mitral regurgitation. PMI is diffuse. Carotid upstroke normal, without bruit. Jugular venous pressure is normal.  LUNGS:  Have bibasilar crackles and decreased breath sounds.  ABDOMEN: Soft, nontender, without hepatosplenomegaly or masses. Abdominal aorta is normal size, without bruit.  EXTREMITIES:  Show 2+ radial, femoral, dorsal pedal pulses, with 2+ lower extremity edema. No cyanosis, clubbing, ulcers.  NEUROLOGIC:  She is oriented to time, place and person, with normal mood and affect.   ASSESSMENT: This is a 54 year old female with hypertension, hyperlipidemia, diabetes, anemia, bradycardia, chronic kidney disease, diastolic dysfunction, congestive heart failure, without current evidence of myocardial infarction, needing further medication management and treatment options.   RECOMMENDATIONS: 1.  Intravenous Lasix for acute on chronic diastolic dysfunction congestive heart failure.   2.  Further treatment of anemia and chronic kidney disease, likely exacerbating acute on chronic congestive heart failure.   3.  Continue serial ECG and enzymes to assess for possible myocardial infarction.   4.  Echocardiogram for LV systolic dysfunction valvular heart disease, and reassessment of extensive diastolic dysfunction.  5.  Decrease dose of metoprolol, which is likely causing bradycardia, and continue clonidine due to concerns of significant hypertension.   6.  ACE inhibitor if able for chronic kidney disease, watching closely for worsening chronic kidney disease and renal protection from diabetes.   7.  Ambulation, and follow for any other significant symptoms,  but no further intervention, including no need for inpatient  stress test or cardiac catheterization due to normal troponin and no evidence of myocardial infarction.    ____________________________ Corey Skains, MD bjk:mr D: 03/30/2013 18:36:00 ET T: 03/30/2013 20:03:16 ET JOB#: [id number]  cc: Corey Skains, MD, <Dictator> Corey Skains MD ELECTRONICALLY SIGNED 04/06/2013 7:50

## 2015-02-24 NOTE — H&P (Signed)
PATIENT NAME:  Rachel Holmes, Rachel Holmes MR#:  [id number] DATE OF BIRTH:  1961-08-25  DATE OF ADMISSION:  03/30/2013  REFERRING PHYSICIAN: Connye Burkitt. Lovena Le, MD  PRIMARY CARE PHYSICIAN: [facility].   CHIEF COMPLAINT: Cough, shortness of breath, chest heaviness.   HISTORY OF PRESENT ILLNESS: This is a 54 year old female with significant past medical history of diabetes, neuropathy, hypertension, hyperlipidemia, congestive heart failure, who presents with complaints of cough. She reported the cough started this evening, but denies any productive sputum, any nasal discharge. Her chest x-ray did not show an infiltrate. The patient was afebrile, did not have any leukocytosis. As well, reports chest heaviness that has been going on for the last few hours, reports waxes and wanes. No relieving and no provoking factors, nonradiating. As well, she has some shortness of breath, and she reported that she had some wheezing at home. Denies any fever, chills, sweating, runny nose, any headache, dizziness, palpitations or diaphoresis. The patient's first cardiac enzymes were negative. Her EKG does show lateral lead Q wave, which appears to be old. The patient, on presentation, had significantly elevated blood pressure at [id number] systolic blood pressure. The patient received nitro paste, where her blood pressure improved with that, as well her chest heaviness much improved with that as well. The patient denies any such previous episodes of chest pain in the past. Reports she did not have a stress test for a few years. As per her medical record, last cardiac cath she had in 2003, which did not show any significant coronary artery disease. She denies seeing any cardiologist. The patient had mild lower extremity edema, which she reports it is chronic, where she is taking Lasix for that. As well, she reports she has history of congestive heart failure, but I do not have any recent echo on record. It is unclear what kind of heart failure,  if it is systolic or diastolic, but the patient's proBNP was elevated at 742. The patient's last BNP was 189. The patient is known to have history of chronic kidney disease with creatinine of 1.63 today; her baseline is 1.6. The patient was given 324 mg of aspirin in the ED. Hospitalist service was requested to admit the patient for further management and workup of her chest heaviness.  PAST MEDICAL HISTORY:  1. Diabetes mellitus for the last 25 years with retinopathy and neuropathy.  2. Hypertension.  3. Gastroesophageal reflux disease.  4. Anemia.  5. Depression.  6. Chronic kidney disease, with baseline creatinine of 1.6.  7. Cardiac cath in 2003 without significant coronary artery disease.  8. Hysterectomy and bilateral oophorectomy.  9. Status post tubal litigation.   HOME MEDICATIONS:  1. Aspirin 81 mg daily.  2. Losartan 100 mg oral daily.  3. Clonidine 0.3 mg oral 2 times a day.  4. Isosorbide mononitrate 60 mg oral daily.  5. Gabapentin 600 mg oral daily.  6. Lasix 20 mg oral daily.  7. Norvasc 10 mg oral daily.  8. Metoprolol 50 mg oral p.o. every 12 hours.  9. Pravastatin 20 mg oral at bedtime.  10. Levemir FlexPen 30 units subcutaneous at bedtime.  11. Omeprazole 40 mg oral 2 times a day.  12. Hydralazine 50 mg oral 2 times a day.   SOCIAL HISTORY: She denies any smoking, alcohol or drug use. She is on disability secondary to blindness from her diabetes.   FAMILY HISTORY: Dad had prostate cancer. Brother had prostate cancer. Sister had congestive heart failure.   REVIEW OF SYSTEMS:  CONSTITUTIONAL: The patient denies fever, chills, weakness, fatigue, weight gain, weight loss.  EYES: Denies blurry vision, double vision, inflammation out of her baseline. She is legally blind secondary to her diabetic retinopathy.  ENT: Denies tinnitus, ear pain, hearing loss, epistaxis or discharge.  RESPIRATORY: Complains of cough, dyspnea. Denies any painful respirations. Reports  history of COPD secondary to secondhand smoking, had some wheezing.  CARDIOVASCULAR: Has chest heaviness, lower extremity edema, orthopnea. Denies any palpitation, arrhythmia or syncope.  GASTROINTESTINAL: Denies nausea, vomiting, diarrhea, abdominal pain, hematemesis, melena, rectal bleed.  GENITOURINARY: Denies dysuria, hematuria, renal colic.  ENDOCRINE: Denies polyuria, polydipsia, heat or cold intolerance.  HEMATOLOGY: Denies any easy bruising or bleeding diathesis. Reports history of anemia.  INTEGUMENTARY: Denies acne, rash or skin lesions.  MUSCULOSKELETAL: Denies any gout, cramps, knee pain or shoulder pain.  NEUROLOGIC: Denies numbness, dysarthria, epilepsy, tremors, vertigo, CVA, seizures or memory loss.  PSYCHIATRIC: Has history of depression. Denies any substance or alcohol abuse or schizophrenia.   PHYSICAL EXAMINATION:  VITAL SIGNS: Temperature 97.8, pulse 60, respiratory rate 18, blood pressure 187/71, saturating 98% on oxygen.  GENERAL: Morbidly obese female, looks comfortable in bed, in no apparent distress.  HEENT: Head atraumatic, normocephalic. Pupils equally sluggishly reactive. Extraocular muscles intact. Pink conjunctivae. Anicteric sclerae. Moist oral mucosa. Wearing dentures.  NECK: Supple. No thyromegaly. No JVD. No carotid bruits.  CHEST: The patient had good air entry bilaterally. No wheezing, rales or rhonchi. No use of accessory muscles.  CARDIOVASCULAR: S1, S2 heard. No rubs, murmurs or gallops. Has +1 edema bilaterally in the lower extremities.  ABDOMEN: Obese, soft, nontender, nondistended. Bowel sounds are present. No hepatosplenomegaly.  PSYCHIATRIC: Appropriate affect. Awake, alert x3. Intact judgment and insight.  SKIN: Normal skin turgor. Warm and dry.  NEUROLOGIC: Cranial nerves grossly intact except the patient is legally blind. No focal motor or sensory deficits.   PERTINENT LABORATORY DATA: Glucose 249. BNP 742. BUN 26, creatinine 1.63, sodium 140,  potassium 3.5, chloride 109, CO2 26. Troponin less than 0.02. White blood cells 8.7, hemoglobin 9.8, hematocrit 29.9, platelets 219. EKG showing normal sinus rhythm with Q waves in lead aVL and lead I.   ASSESSMENT AND PLAN:  1. Chest pain. The patient is currently chest pain-free. Her chest pain resolved after receiving the nitro paste. The patient already received 324 mg of aspirin in ED. Given the patient's multiple risk factors and her chest pain resolved with nitro, she will be admitted to tele. Will continue to cycle her cardiac enzymes and will follow the trend. Given her Q waves in the lateral leads and her risk factors, will consult cardiology for further evaluation, if there is any more workup that is indicated at this point. The patient is already on aspirin, statin, beta blockers and losartan. The patient's malignant hypertension most likely was contributing to her chest pain as well.  2. Congestive heart failure. The patient complaining of shortness of breath. As well, has lower extremity edema with elevated BNP. There is no recent echo, so will check echocardiogram to see if she has any systolic or diastolic dysfunction and will be started on low-dose IV Lasix, where she will be diuresed gently given her chronic kidney disease.  3. Malignant hypertension. This is most likely contributing to her chest pain. Will resume the patient back on her home medication. As well, will add her nitro paste and p.r.n. IV hydralazine.  4. Diabetes mellitus, uncontrolled. Will check hemoglobin A1c. Will continue her on Levemir and will add her on  insulin sliding scale.  5. Gastroesophageal reflux disease. Continue with PPI.  6. Anemia. The patient reports she has history of anemia, has been followed with hematology as an outpatient. Will continue her on iron supplements.  7. Depression. Continue with home meds.  8. History of chronic kidney disease, appears to be at baseline. Will monitor closely, especially  as she will be started on IV diuresis.  9. Deep vein thrombosis prophylaxis. Subcutaneous heparin. 10. Gastrointestinal prophylaxis. On PPI.   CODE STATUS: Full code.   TOTAL TIME SPENT ON ADMISSION AND PATIENT CARE: 60 minutes.   ____________________________ Albertine Patricia, MD dse:OSi D: 03/30/2013 07:19:18 ET T: 03/30/2013 07:51:08 ET JOB#: [id number]  cc: Albertine Patricia, MD, <Dictator> Trysten Bernard Graciela Husbands MD ELECTRONICALLY SIGNED 03/31/2013 2:22

## 2015-02-24 NOTE — Discharge Summary (Signed)
PATIENT NAME:  Rachel Holmes, Rachel Holmes MR#:  [id number] DATE OF BIRTH:  [birth year]  DATE OF ADMISSION:  03/30/2013  DATE OF DISCHARGE:  04/02/2013  CHIEF COMPLAINT:  Cough, shortness of breath, chest heaviness.   CONSULTANTS:  Dr. Nehemiah Massed from cardiology.   DISCHARGE DIAGNOSES: 1.  Chronic diastolic congestive heart failure.  2.  Accelerated malignant hypertension.  3.  Chronic kidney disease.  4.  Bradycardia, improved after stopping metoprolol, likely medication induced.  5.  Obesity.  6.  Gastroesophageal reflux disease.  7.  History of stroke.  8.  Chronic disease, anemia.  9.  Hyperlipidemia.   DISCHARGE MEDICATIONS:   1.  Losartan 100 mg daily.  2.  Clonidine 0.3 mg 2 times a day.  3.  Lasix 20 mg daily.  4.  Gabapentin 600 mg at bedtime.  5.  Pravastatin 20 mg daily.  6.  Aspirin 81 mg daily.  7.  Amlodipine 10 mg daily.  8.  Women's vitamin 1 tab daily.  9.  Isosorbide mononitrate 60 mg daily.  10.  Levemir 30 units daily.  11.  Omeprazole 40 mg 2 times a day.  12.  Ferrous sulfate 325 mg 2 times a day.  13.  Hydralazine 100 mg every 12 hours.   DIET:  Low sodium, ADA diet.   ACTIVITY:  As tolerated.   FOLLOWUP:  Please follow with PCP and cardiologist within 1 to 2 weeks. Discuss with your doctor about getting a renal duplex to evaluate for renal artery stenosis, per cardiology's recommendation. The patient is full code.   TOTAL TIME SPENT:  Thirty-five minutes.   HISTORY OF PRESENT ILLNESS AND HOSPITAL COURSE:  For full details of history and physical, please see the dictation on May 27 by Dr. Waldron Labs, but briefly this is a 54 year old female with multiple comorbidities who presented with the above chief complaint. The patient  started to have a cough without productive sputum or nasal discharge. X-ray did not show any significant infiltrate and the patient was afebrile. Was admitted to the hospitalist service. She had significant elevation of blood pressure, about  [id number] systolic. Was admitted to the hospitalist service with nitro paste and blood pressure was brought down slowly. The patient did have episodes of chest pain and was ruled out for acute coronary syndrome. Cardiology was consulted. She did have elevated BNP and signs and symptoms of CHF, likely in the setting of malignant accelerated hypertension. The patient was diuresed and the blood pressure medications were advanced. The patient did have an episode of bradycardia which was sinus, likely medication induced as after cessation of metoprolol it improved. Her PPI was continued. Her creatinine did remain stable. Her symptoms have improved. Her CKD is stage 3. At this point, she will be discharged with outpatient follow-up.   TOTAL TIME SPENT:  Thirty-five minutes.   ____________________________ Vivien Presto, MD sa:nts D: 04/02/2013 18:41:15 ET T: 04/03/2013 10:29:48 ET JOB#: [id number]  cc: Vivien Presto, MD, <Dictator> Vivien Presto MD ELECTRONICALLY SIGNED 04/30/2013 20:56

## 2015-02-25 NOTE — H&P (Signed)
PATIENT NAME:  Rachel Holmes, Rachel Holmes MR#:  [id number] DATE OF BIRTH:  07-18-1961  DATE OF ADMISSION:  11/19/2013  PRIMARY CARE PHYSICIAN: Dr. Iona Beard.  REFERRING PHYSICIAN: Dr. Joni Fears.  CHIEF COMPLAINT: Chest pain, shortness of breath for 4 weeks.   HISTORY OF PRESENT ILLNESS: A 54 year old year-old [place] American female with a history of hypertension, diabetes, DVT, CHF, hyperlipidemia who presented to the ED with above chief complaint. The patient is alert, awake, oriented, in no acute distress. The patient said she has had chest pain and shortness of breath for the past 4 weeks, then worsening for the past 2 weeks. The patient's chest pain is in substernal area, burning sensation, no radiation. The patient also complains of headache, dizziness, and weakness, but the patient denies any orthopnea, nocturnal dyspnea, or leg edema. No weight gain but has lost weight, 7 pounds. The patient was noted to have a high blood pressure at 228/86. The patient denies any other symptoms.   PAST MEDICAL HISTORY: Hypertension, diabetes, CHF, CKD, hyperlipidemia, DVT 20 years ago complicated with retinopathy and neuropathy, left eye blind.   SOCIAL HISTORY: No smoking or drinking or illicit drugs.   FAMILY HISTORY: Positive for hypertension, diabetes, heart attack and stroke and cancer.   PAST SURGICAL HISTORY: Tubal ligation, hysterectomy, eye surgery on both eyes.  ALLERGIES: CODEINE, LOVASTATIN, OXYCONTIN, PENICILLIN, PERCOCET, QUINAPRIL, VICODIN.   HOME MEDICATIONS:  1.  Omeprazole 40 mg p.o. b.i.d. 2.  Losartan 100 mg p.o. daily.  3.  Levemir 36 units once a day at bedtime.  4.  Januvia 50 mg p.o. daily.  5.  Imdur 60 mg p.o. daily.  6.  Hydralazine 100 mg p.o. q.12 hours. 7.  Glipizide 10 mg p.o. b.i.d.  8.  Gabapentin 600 mg p.o. at bedtime.  9.  Lasix 20 mg p.o. daily. 10.  Ferrous sulfate 325 mg p.o. b.i.d.  11.  Clonidine 0.3 mg p.o. b.i.d.  12.  Aspirin 325 mg p.o. daily.  13.  Norvasc 10  mg p.o. daily.   REVIEW OF SYSTEMS:    CONSTITUTIONAL: The patient denies any fever, chills, but has headache, dizziness, weight loss, and generalized weakness.  EYES: No double vision, blurred vision.  EARS, NOSE, THROAT: No postnasal drip, slurred speech or dysphagia.  CARDIOVASCULAR: Positive for chest pain. No palpitations, orthopnea or nocturnal dyspnea. No leg edema.  PULMONARY: No cough, sputum, but has shortness of breath. No hemoptysis.  GASTROINTESTINAL: No abdominal pain, nausea, vomiting, diarrhea. No melena or bloody stool.  GENITOURINARY: No dysuria, hematuria or incontinence.  SKIN: No rash or jaundice.  NEUROLOGIC: No syncope, loss of consciousness or seizure.  HEMATOLOGIC: No easy bruising or bleeding.  ENDOCRINE: No polyuria, polydipsia, heat or cold intolerance.   PHYSICAL EXAMINATION: VITAL SIGNS: Temperature is 97.7, blood pressure 228/86, pulse 65, respirations 20, oxygen saturation 99% on room air.  GENERAL: The patient is alert, awake, oriented, in no acute distress.  HEENT: Pupils round, equal, reactive to light and accommodation.  NECK: Supple. No JVD or carotid bruits. No lymphadenopathy. No thyromegaly.  CARDIOVASCULAR: S1, S2, regular rate and rhythm. No murmurs or gallops.  PULMONARY: Bilateral air entry. No wheezing or rales. No use of accessory muscles to breathe.  ABDOMEN: Obese, soft. Bowel sounds present. No organomegaly. No distention or tenderness.  EXTREMITIES: No edema, clubbing or cyanosis. Left calf tenderness. Bilateral pedal pulses present.  NEUROLOGIC: A and O x 3. No focal deficit. Power 5/5. Sensation intact.   LABORATORY, DIAGNOSTIC AND RADIOLOGICAL DATA: Ultrasound of  left leg negative for DVT. Chest x-ray: No acute cardiopulmonary disease.   Troponin less than 0.02. WBC 7.4, hemoglobin 9.5, platelets 239. Glucose 135, BUN 28, creatinine 2.17. Electrolytes normal. BNP 185. Lipase 103.   EKG showed a normal sinus rhythm at 63 BPM.    IMPRESSIONS: 1.  Chest pain, possibly due to hypertension malignancy, but need to rule out acute coronary syndrome and coronary artery disease.  2.  Hypertension malignancy.  3.  Diabetes.  4.  Chronic kidney disease.  5.  Obesity.  6.  Anemia.   PLAN OF TREATMENT: 1.  The patient will be admitted to telemetry floor. We will give hydralazine IV p.r.n. and continue the patient's hypertension medication to control the blood pressure.  2.  For chest pain, we will get follow up troponin level and get a stress test tomorrow morning. In addition, we will give aspirin and continue the patient's blood pressure medication.  3.  For diabetes, we will start sliding scale. Continue Levemir. Check hemoglobin A1c, lipid panel, TSH. 4.  I discussed the patient's condition and the plan of treatment with the patient and the patient's sister. The patient wants full code.   TIME SPENT: About 55 minutes.    ____________________________ Demetrios Loll, MD qc:jcm D: 11/19/2013 12:08:23 ET T: 11/19/2013 12:33:35 ET JOB#: [id number]  cc: Demetrios Loll, MD, <Dictator> Demetrios Loll MD ELECTRONICALLY SIGNED 11/19/2013 15:19

## 2015-02-25 NOTE — Discharge Summary (Signed)
PATIENT NAME:  Rachel Holmes, Rachel Holmes MR#:  [id number] DATE OF BIRTH:  09-Nov-1960  DATE OF ADMISSION:  10/21/2013 DATE OF DISCHARGE:  10/22/2013  DISCHARGE DIAGNOSIS: 1.  Malignant hypertension. 2.  Hyperlipidemia. 3.  Diabetes.  CONDITION ON DISCHARGE: Stable.   CODE STATUS: FULL code.   DISCHARGE MEDICATIONS: 1.  Losartan 100 mg oral tablet once a day for high blood pressure.  2.  Clonidine 0.3 mg oral tablet 2 times a day. 3.  Gabapentin 600 mg oral once a day. 4.  Amlodipine 10 mg oral tablet once a day.  5.  Isosorbide mononitrate 60 mg oral tablet extended-release once a day.  6.  Hydralazine 100 mg oral tablet every 12 hours. 7.  Levemir 36 units subcutaneous once a day.  8.  Aspirin 325 mg once a day.  9.  Omeprazole 40 mg delayed-release 2 times a day.  10.  Ferrous sulfate 325 mg oral 2 times a day.  11.  Januvia 50 mg oral tablet once a day. 12.  Glipizide 10 mg oral tablet 2 times a day.  13.  Alprazolam 0.25 mg 2 times a day.  14.  Furosemide 20 mg once a day.   DISCHARGE DIET: Low-sodium, carbonate-controlled ADA diet. Regular consistency.   TIMEFRAME TO FOLLOW-UP: Within 1 to 2 weeks with PMD and ophthalmologic clinic for blurry vision.  HISTORY OF PRESENT ILLNESS: A 54 year old African American female with history of insulin-dependent diabetes poorly controlled with associated retinopathy and neuropathy, diastolic congestive heart failure, hyperlipidemia, and hypertension presenting with 1 day duration of chest pain. She states that she awoke around 9 a.m. Had eye floaters followed by burning in the chest, 6 to 7/10, associated with palpitations and dyspnea on exertion. Blood pressure was noticed to be 220/110 and came to the Emergency Room for further work-up. Blood pressure improved after nitro paste.   HOSPITAL COURSE AND STAY: 1.  Malignant hypertension. Blood pressure was under control and we resumed home medication. We used Xanax for anxiety control and advised to  lose some weight.  2.  Blurry vision and history of retinal hemorrhage. I spoke to ophthalmologist, Dr. George Ina, and he suggested the patient needs to follow in the clinic with them. We spoke to the patient and advise her about this.  3.  Type 2 diabetes, insulin-dependent, poorly controlled, complicated by retinopathy and neuropathy. We continued Levemir. 4.  Chest pain. Telemetry was negative. Cardiac enzymes were negative.  5.  Neuropathy. Continued Gabapentin.   DISPOSITION: Discharged home.   IMPORTANT LABORATORY RESULTS IN THE HOSPITAL: Troponin was less than 0.02. White cell count 6.4, hemoglobin 10.2. Creatinine 2.03. Potassium was 5.3 on presentation. creatinine came to 1.96 and later on 1.9.  TOTAL TIME SPENT ON THIS DISCHARGE: 40 minutes.  ____________________________ Ceasar Lund Anselm Jungling, MD vgv:sb D: 10/25/2013 15:13:56 ET T: 10/25/2013 17:12:07 ET JOB#: [id number]  cc: Ceasar Lund. Anselm Jungling, MD, <Dictator> Vaughan Basta MD ELECTRONICALLY SIGNED 11/07/2013 21:56

## 2015-02-25 NOTE — Discharge Summary (Signed)
PATIENT NAME:  Rachel Holmes, SURRIDGE MR#:  [id number] DATE OF BIRTH:  23-Dec-1960  DATE OF ADMISSION:  11/19/2013 DATE OF DISCHARGE:  11/21/2013  ADMISSION DIAGNOSES:  1.  Chest pain.  2.  Morbid obesity.   DISCHARGE DIAGNOSES:  1.  Chest pain.  2.  Chronic kidney disease.  3.  Morbid obesity.   CONSULTATIONS: None.  IMAGING: The patient underwent a Myoview stress test, which essentially was negative for any evidence of ischemia. Discharge sodium 137, potassium 4.6, chloride 107, bicarbonate 24, BUN 34, creatinine 2.15, glucose is 169. Troponins were negative. A 2D echocardiogram showed normal ejection fraction with mild to moderate TR.   HOSPITAL COURSE: A 54 year old female, who presented with chest pain. For further details, please refer to the H and P.  1.  Chest pain. The patient's chest pain was atypical in nature. However, she did undergo rule out for acute coronary syndrome. She was admitted to telemetry. Telemetry showed no evidence of acute arrhythmias. Her troponins were negative. She underwent a stress test. Due to obesity, it did take 2 days. The stress test was essentially negative.  2.  Hypertension. The patient was continued on her outpatient medications. Initially, some of her blood pressures medications like Losartan and Lasix were on hold due to her acute kidney injury, but we realize that this is probably at baseline.  3.  Diabetes. The patient was continued on her outpatient medications.  4.  Morbid obesity. Encouraged weight loss as tolerated.  5.  Acute kidney injury. Initially with Losartan and Lasix, but she has underlying kidney disease and her baseline here has been around 2, which has been really stable. She does follow up with Dr. Juleen China as an outpatient.   DISCHARGE MEDICATIONS:  1.  Losartan 100 mg daily.  2.  Clonidine 0.3 mg b.i.d.  3.  Norvasc 10 mg daily.  4.  Imdur 60 mg daily.  5.  Hydralazine 100 mg q.12 hours.  6.  Levemir 36 units at bedtime.  7.   Aspirin 325 mg daily.  8.  Omeprazole 40 mg b.i.d. 9.  Ferrous sulfate 325 mg b.i.d.  10. Januvia 50 mg daily.  11. Glipizide 10 mg b.i.d.  12. Lasix 20 mg daily.  13. Aspirin 81 mg daily.   DISCHARGE DIET: ADA, low-sodium diet.   DISCHARGE ACTIVITY: As tolerated.   DISCHARGE FOLLOWUP: The patient will follow up with Dr. Juleen China in 1 week.  DISCHARGE CONDITION: The patient is medically stable for discharge.   TIME SPENT: Approximately 35 minutes on this discharge.  ____________________________ Elga Santy P. Benjie Karvonen, MD spm:aw D: 11/21/2013 13:13:40 ET T: 11/22/2013 07:00:19 ET JOB#: [id number]  cc: Levii Hairfield P. Benjie Karvonen, MD, <Dictator> Donell Beers Sheral Pfahler MD ELECTRONICALLY SIGNED 11/24/2013 13:13

## 2015-05-29 ENCOUNTER — Emergency Department: Payer: Medicare Other

## 2015-05-29 ENCOUNTER — Encounter: Payer: Self-pay | Admitting: Emergency Medicine

## 2015-05-29 ENCOUNTER — Emergency Department
Admission: EM | Admit: 2015-05-29 | Discharge: 2015-05-29 | Disposition: A | Discharge status: Home / Self Care | Payer: Medicare Other | Attending: Emergency Medicine | Admitting: Emergency Medicine

## 2015-05-29 DIAGNOSIS — Y9389 Activity, other specified: Secondary | ICD-10-CM | POA: Insufficient documentation

## 2015-05-29 DIAGNOSIS — W1839XA Other fall on same level, initial encounter: Secondary | ICD-10-CM | POA: Insufficient documentation

## 2015-05-29 DIAGNOSIS — E119 Type 2 diabetes mellitus without complications: Secondary | ICD-10-CM | POA: Diagnosis not present

## 2015-05-29 DIAGNOSIS — Y998 Other external cause status: Secondary | ICD-10-CM | POA: Insufficient documentation

## 2015-05-29 DIAGNOSIS — S8011XA Contusion of right lower leg, initial encounter: Secondary | ICD-10-CM

## 2015-05-29 DIAGNOSIS — Y9289 Other specified places as the place of occurrence of the external cause: Secondary | ICD-10-CM | POA: Insufficient documentation

## 2015-05-29 DIAGNOSIS — I10 Essential (primary) hypertension: Secondary | ICD-10-CM | POA: Diagnosis not present

## 2015-05-29 DIAGNOSIS — S4991XA Unspecified injury of right shoulder and upper arm, initial encounter: Secondary | ICD-10-CM | POA: Diagnosis present

## 2015-05-29 DIAGNOSIS — S2002XA Contusion of left breast, initial encounter: Secondary | ICD-10-CM | POA: Insufficient documentation

## 2015-05-29 HISTORY — DX: Essential (primary) hypertension: I10

## 2015-05-29 HISTORY — DX: Type 2 diabetes mellitus without complications: E11.9

## 2015-05-29 MED ORDER — IBUPROFEN 800 MG PO TABS: 800.0000 mg | ORAL_TABLET | Freq: Three times a day (TID) | ORAL | Status: DC | PRN

## 2015-05-29 MED ORDER — TRAMADOL HCL 50 MG PO TABS: 50.0000 mg | ORAL_TABLET | Freq: Four times a day (QID) | ORAL | Status: DC | PRN

## 2015-05-29 NOTE — ED Notes (Signed)
Pt presents with right lower leg pain after falling last Thursday. Pt ambulated without any difficulty to triage room.

## 2015-05-29 NOTE — ED Provider Notes (Signed)
[facility] Emergency Department Provider Note  ____________________________________________  Time seen: Approximately 2:57 PM  I have reviewed the triage vital signs and the nursing notes.   HISTORY  Chief Complaint Leg Pain    HPI Rachel Holmes is a 54 y.o. female patient complaining of continued right leg pain secondary to a fall 4 days ago. Patient stated there is pain and swelling to the inferior patella of the right leg. Patient states she did not see a doctor initial injury is now having trouble with ambulation. Plan to the room the patient is eating box meal and appears in no acute distress. Patient is rating her pain as a 5/10 describe it as dull resting shot with ambulation. Patient stated no palliative measures taken for this complaint.   Past Medical History  Diagnosis Date  . Hypertension   . Diabetes mellitus without complication     There are no active problems to display for this patient.   Past Surgical History  Procedure Laterality Date  . Abdominal hysterectomy    . Eye surgery      Current Outpatient Rx  Name  Route  Sig  Dispense  Refill  . ibuprofen (ADVIL,MOTRIN) 800 MG tablet   Oral   Take 1 tablet (800 mg total) by mouth every 8 (eight) hours as needed for moderate pain.   15 tablet   0   . traMADol (ULTRAM) 50 MG tablet   Oral   Take 1 tablet (50 mg total) by mouth every 6 (six) hours as needed for moderate pain.   12 tablet   0     Allergies Review of patient's allergies indicates no known allergies.  No family history on file.  Social History History  Substance Use Topics  . Smoking status: Never Smoker   . Smokeless tobacco: Not on file  . Alcohol Use: No    Review of Systems Constitutional: No fever/chills Eyes: No visual changes. ENT: No sore throat. Cardiovascular: Denies chest pain. Respiratory: Denies shortness of breath. Gastrointestinal: No abdominal pain.  No nausea, no vomiting.  No  diarrhea.  No constipation. Genitourinary: Negative for dysuria. Musculoskeletal: Right lower leg pain. Skin: Negative for rash. Neurological: Negative for headaches, focal weakness or numbness. Endocrine:Hypertension and diabetes. Hematological/Lymphatic: 10-point ROS otherwise negative.  ____________________________________________   PHYSICAL EXAM:  VITAL SIGNS: ED Triage Vitals  Enc Vitals Group     BP 05/29/15 1311 135/65 mmHg     Pulse Rate 05/29/15 1311 67     Resp 05/29/15 1311 20     Temp 05/29/15 1311 98.6 F (37 C)     Temp Source 05/29/15 1311 Oral     SpO2 05/29/15 1311 95 %     Weight 05/29/15 1311 290 lb (131.543 kg)     Height 05/29/15 1311 5\' 8"  (1.727 m)     Head Cir --      Peak Flow --      Pain Score 05/29/15 1312 5     Pain Loc --      Pain Edu? --      Excl. in [place]? --     Constitutional: Alert and oriented. Well appearing and in no acute distress. Eyes: Conjunctivae are normal. PERRL. EOMI. Head: Atraumatic. Nose: No congestion/rhinnorhea. Mouth/Throat: Mucous membranes are moist.  Oropharynx non-erythematous. Neck: No stridor. No cervical spine tenderness to palpation. Hematological/Lymphatic/Immunilogical: No cervical lymphadenopathy. Cardiovascular: Normal rate, regular rhythm. Grossly normal heart sounds.  Good peripheral circulation. Respiratory: Normal respiratory effort.  No retractions. Lungs CTAB. Gastrointestinal: Soft and nontender. No distention. No abdominal bruits. No CVA tenderness. Musculoskeletal: No lower extremity tenderness nor edema.  No joint effusions. Neurologic:  Normal speech and language. No gross focal neurologic deficits are appreciated. No gait instability. Skin:  Skin is warm, dry and intact. No rash noted. Psychiatric: Mood and affect are normal. Speech and behavior are normal.  ____________________________________________   LABS (all labs ordered are listed, but only abnormal results are displayed)  Labs  Reviewed - No data to display ____________________________________________  EKG   ____________________________________________  RADIOLOGY No acute findings of the right tib-fib  ____________________________________________   PROCEDURES  Procedure(s) performed: None  Critical Care performed: No  ____________________________________________   INITIAL IMPRESSION / ASSESSMENT AND PLAN / ED COURSE  Pertinent labs & imaging results that were available during my care of the patient were reviewed by me and considered in my medical decision making (see chart for details).  Right lower leg contusion. Upon reassessment is a patient brought my attention to ecchymosis to the left breast which she states sustain also secondary to his fall. Discussed x-ray findings and home care. Patient will be discharged with ibuprofen and tramadol. Patient advised to follow-up with family doctor return by ER for condition worsens. ____________________________________________   FINAL CLINICAL IMPRESSION(S) / ED DIAGNOSES  Final diagnoses:  Contusion of right leg, initial encounter  Contusion of left breast, initial encounter      Sable Feil, PA-C 05/29/15 1619  Lavonia Drafts, MD 05/30/15 [phone]

## 2015-08-23 DIAGNOSIS — S82001A Unspecified fracture of right patella, initial encounter for closed fracture: Secondary | ICD-10-CM | POA: Insufficient documentation

## 2015-10-23 ENCOUNTER — Encounter: Payer: Self-pay | Admitting: Urgent Care

## 2015-10-23 ENCOUNTER — Emergency Department
Admission: EM | Admit: 2015-10-23 | Discharge: 2015-10-23 | Disposition: A | Discharge status: Home / Self Care | Payer: Medicare Other | Attending: Emergency Medicine | Admitting: Emergency Medicine

## 2015-10-23 ENCOUNTER — Emergency Department: Payer: Medicare Other

## 2015-10-23 DIAGNOSIS — S8992XA Unspecified injury of left lower leg, initial encounter: Secondary | ICD-10-CM | POA: Insufficient documentation

## 2015-10-23 DIAGNOSIS — E119 Type 2 diabetes mellitus without complications: Secondary | ICD-10-CM | POA: Diagnosis not present

## 2015-10-23 DIAGNOSIS — I10 Essential (primary) hypertension: Secondary | ICD-10-CM | POA: Insufficient documentation

## 2015-10-23 DIAGNOSIS — S29001A Unspecified injury of muscle and tendon of front wall of thorax, initial encounter: Secondary | ICD-10-CM | POA: Diagnosis present

## 2015-10-23 DIAGNOSIS — M7918 Myalgia, other site: Secondary | ICD-10-CM

## 2015-10-23 DIAGNOSIS — S8991XA Unspecified injury of right lower leg, initial encounter: Secondary | ICD-10-CM | POA: Insufficient documentation

## 2015-10-23 DIAGNOSIS — Y92481 Parking lot as the place of occurrence of the external cause: Secondary | ICD-10-CM | POA: Diagnosis not present

## 2015-10-23 DIAGNOSIS — Y998 Other external cause status: Secondary | ICD-10-CM | POA: Insufficient documentation

## 2015-10-23 DIAGNOSIS — Z79899 Other long term (current) drug therapy: Secondary | ICD-10-CM | POA: Insufficient documentation

## 2015-10-23 DIAGNOSIS — Y9389 Activity, other specified: Secondary | ICD-10-CM | POA: Insufficient documentation

## 2015-10-23 DIAGNOSIS — S20212A Contusion of left front wall of thorax, initial encounter: Secondary | ICD-10-CM | POA: Insufficient documentation

## 2015-10-23 HISTORY — DX: Legal blindness, as defined in USA: H54.8

## 2015-10-23 MED ORDER — CYCLOBENZAPRINE HCL 10 MG PO TABS: 10.0000 mg | ORAL_TABLET | Freq: Three times a day (TID) | ORAL | Status: DC | PRN

## 2015-10-23 MED ORDER — CYCLOBENZAPRINE HCL 10 MG PO TABS
10.0000 mg | ORAL_TABLET | Freq: Once | ORAL | Status: AC
  Administered 2015-10-23: 10 mg via ORAL
  Filled 2015-10-23: qty 1

## 2015-10-23 MED ORDER — TRAMADOL HCL 50 MG PO TABS
100.0000 mg | ORAL_TABLET | Freq: Once | ORAL | Status: DC
  Filled 2015-10-23: qty 2

## 2015-10-23 MED ORDER — MORPHINE SULFATE (PF) 4 MG/ML IV SOLN: 4.0000 mg | Freq: Once | INTRAVENOUS | Status: DC

## 2015-10-23 NOTE — Discharge Instructions (Signed)
Blunt Chest Trauma °Blunt chest trauma is an injury caused by a blow to the chest. These chest injuries can be very painful. Blunt chest trauma often results in bruised or broken (fractured) ribs. Most cases of bruised and fractured ribs from blunt chest traumas get better after 1 to 3 weeks of rest and pain medicine. Often, the soft tissue in the chest wall is also injured, causing pain and bruising. Internal organs, such as the heart and lungs, may also be injured. Blunt chest trauma can lead to serious medical problems. This injury requires immediate medical care. °CAUSES  °· Motor vehicle collisions. °· Falls. °· Physical violence. °· Sports injuries. °SYMPTOMS  °· Chest pain. The pain may be worse when you move or breathe deeply. °· Shortness of breath. °· Lightheadedness. °· Bruising. °· Tenderness. °· Swelling. °DIAGNOSIS  °Your caregiver will do a physical exam. X-rays may be taken to look for fractures. However, minor rib fractures may not show up on X-rays until a few days after the injury. If a more serious injury is suspected, further imaging tests may be done. This may include ultrasounds, computed tomography (CT) scans, or magnetic resonance imaging (MRI). °TREATMENT  °Treatment depends on the severity of your injury. Your caregiver may prescribe pain medicines and deep breathing exercises. °HOME CARE INSTRUCTIONS °· Limit your activities until you can move around without much pain. °· Do not do any strenuous work until your injury is healed. °· Put ice on the injured area. °¨ Put ice in a plastic bag. °¨ Place a towel between your skin and the bag. °¨ Leave the ice on for 15-20 minutes, 03-04 times a day. °· You may wear a rib belt as directed by your caregiver to reduce pain. °· Practice deep breathing as directed by your caregiver to keep your lungs clear. °· Only take over-the-counter or prescription medicines for pain, fever, or discomfort as directed by your caregiver. °SEEK IMMEDIATE MEDICAL  CARE IF:  °· You have increasing pain or shortness of breath. °· You cough up blood. °· You have nausea, vomiting, or abdominal pain. °· You have a fever. °· You feel dizzy, weak, or you faint. °MAKE SURE YOU: °· Understand these instructions. °· Will watch your condition. °· Will get help right away if you are not doing well or get worse. °  °This information is not intended to replace advice given to you by your health care provider. Make sure you discuss any questions you have with your health care provider. °  °Document Released: 11/28/2004 Document Revised: 11/11/2014 Document Reviewed: 04/19/2015 °Elsevier Interactive Patient Education ©2016 [employer] ° °
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
## 2015-11-24 ENCOUNTER — Other Ambulatory Visit: Payer: Self-pay | Admitting: Family Medicine

## 2015-11-24 DIAGNOSIS — N63 Unspecified lump in unspecified breast: Secondary | ICD-10-CM

## 2015-11-24 DIAGNOSIS — N644 Mastodynia: Secondary | ICD-10-CM

## 2015-12-06 ENCOUNTER — Other Ambulatory Visit: Payer: Medicare Other

## 2015-12-06 ENCOUNTER — Ambulatory Visit: Payer: Medicare Other

## 2015-12-27 ENCOUNTER — Ambulatory Visit
Admission: RE | Admit: 2015-12-27 | Discharge: 2015-12-27 | Disposition: A | Discharge status: Home / Self Care | Payer: Medicare Other | Source: Ambulatory Visit | Attending: Family Medicine | Admitting: Family Medicine

## 2015-12-27 ENCOUNTER — Other Ambulatory Visit: Payer: Self-pay | Admitting: Family Medicine

## 2015-12-27 DIAGNOSIS — N63 Unspecified lump in unspecified breast: Secondary | ICD-10-CM

## 2015-12-27 DIAGNOSIS — N644 Mastodynia: Secondary | ICD-10-CM

## 2015-12-27 HISTORY — DX: Unspecified lump in unspecified breast: N63.0

## 2016-02-02 DIAGNOSIS — R112 Nausea with vomiting, unspecified: Secondary | ICD-10-CM | POA: Insufficient documentation

## 2016-02-02 DIAGNOSIS — E871 Hypo-osmolality and hyponatremia: Secondary | ICD-10-CM | POA: Insufficient documentation

## 2016-09-06 DIAGNOSIS — I890 Lymphedema, not elsewhere classified: Secondary | ICD-10-CM | POA: Insufficient documentation

## 2016-09-13 DIAGNOSIS — I350 Nonrheumatic aortic (valve) stenosis: Secondary | ICD-10-CM

## 2016-09-13 DIAGNOSIS — I272 Pulmonary hypertension, unspecified: Secondary | ICD-10-CM

## 2016-09-13 HISTORY — DX: Pulmonary hypertension, unspecified: I27.20

## 2016-09-13 HISTORY — DX: Nonrheumatic aortic (valve) stenosis: I35.0

## 2016-11-26 DIAGNOSIS — G5792 Unspecified mononeuropathy of left lower limb: Secondary | ICD-10-CM | POA: Diagnosis not present

## 2016-11-26 DIAGNOSIS — G8929 Other chronic pain: Secondary | ICD-10-CM | POA: Diagnosis not present

## 2016-11-26 DIAGNOSIS — M25562 Pain in left knee: Secondary | ICD-10-CM | POA: Diagnosis not present

## 2016-11-26 DIAGNOSIS — M25561 Pain in right knee: Secondary | ICD-10-CM | POA: Diagnosis not present

## 2016-11-26 DIAGNOSIS — Z6841 Body Mass Index (BMI) 40.0 and over, adult: Secondary | ICD-10-CM | POA: Diagnosis not present

## 2016-12-06 DIAGNOSIS — E1122 Type 2 diabetes mellitus with diabetic chronic kidney disease: Secondary | ICD-10-CM | POA: Diagnosis not present

## 2016-12-06 DIAGNOSIS — N183 Chronic kidney disease, stage 3 (moderate): Secondary | ICD-10-CM | POA: Diagnosis not present

## 2016-12-06 DIAGNOSIS — E1165 Type 2 diabetes mellitus with hyperglycemia: Secondary | ICD-10-CM | POA: Diagnosis not present

## 2016-12-06 DIAGNOSIS — Z794 Long term (current) use of insulin: Secondary | ICD-10-CM | POA: Diagnosis not present

## 2017-01-16 DIAGNOSIS — M25561 Pain in right knee: Secondary | ICD-10-CM | POA: Diagnosis not present

## 2017-01-16 DIAGNOSIS — I10 Essential (primary) hypertension: Secondary | ICD-10-CM | POA: Diagnosis not present

## 2017-01-16 DIAGNOSIS — G5792 Unspecified mononeuropathy of left lower limb: Secondary | ICD-10-CM | POA: Diagnosis not present

## 2017-01-16 DIAGNOSIS — M1711 Unilateral primary osteoarthritis, right knee: Secondary | ICD-10-CM | POA: Diagnosis not present

## 2017-01-16 DIAGNOSIS — G8929 Other chronic pain: Secondary | ICD-10-CM | POA: Diagnosis not present

## 2017-01-16 DIAGNOSIS — N2581 Secondary hyperparathyroidism of renal origin: Secondary | ICD-10-CM | POA: Diagnosis not present

## 2017-01-16 DIAGNOSIS — I509 Heart failure, unspecified: Secondary | ICD-10-CM | POA: Diagnosis not present

## 2017-02-03 ENCOUNTER — Encounter (HOSPITAL_COMMUNITY): Payer: Self-pay | Admitting: Emergency Medicine

## 2017-02-03 ENCOUNTER — Emergency Department (HOSPITAL_COMMUNITY)
Admission: EM | Admit: 2017-02-03 | Discharge: 2017-02-03 | Disposition: A | Discharge status: Home / Self Care | Payer: Medicare Other | Attending: Emergency Medicine | Admitting: Emergency Medicine

## 2017-02-03 ENCOUNTER — Emergency Department (HOSPITAL_COMMUNITY): Payer: Medicare Other

## 2017-02-03 DIAGNOSIS — E119 Type 2 diabetes mellitus without complications: Secondary | ICD-10-CM | POA: Diagnosis not present

## 2017-02-03 DIAGNOSIS — Z79899 Other long term (current) drug therapy: Secondary | ICD-10-CM | POA: Insufficient documentation

## 2017-02-03 DIAGNOSIS — I10 Essential (primary) hypertension: Secondary | ICD-10-CM | POA: Diagnosis not present

## 2017-02-03 DIAGNOSIS — Z7982 Long term (current) use of aspirin: Secondary | ICD-10-CM | POA: Insufficient documentation

## 2017-02-03 DIAGNOSIS — R9431 Abnormal electrocardiogram [ECG] [EKG]: Secondary | ICD-10-CM | POA: Diagnosis not present

## 2017-02-03 DIAGNOSIS — Z794 Long term (current) use of insulin: Secondary | ICD-10-CM | POA: Diagnosis not present

## 2017-02-03 DIAGNOSIS — R42 Dizziness and giddiness: Secondary | ICD-10-CM | POA: Insufficient documentation

## 2017-02-03 DIAGNOSIS — R110 Nausea: Secondary | ICD-10-CM | POA: Diagnosis not present

## 2017-02-03 DIAGNOSIS — R404 Transient alteration of awareness: Secondary | ICD-10-CM | POA: Diagnosis not present

## 2017-02-03 LAB — URINALYSIS, ROUTINE W REFLEX MICROSCOPIC
Bilirubin Urine: NEGATIVE
Glucose, UA: NEGATIVE mg/dL
Ketones, ur: NEGATIVE mg/dL
Leukocytes, UA: NEGATIVE
Nitrite: NEGATIVE
Protein, ur: 30 mg/dL — AB
Specific Gravity, Urine: 1.009 (ref 1.005–1.030)
pH: 5 (ref 5.0–8.0)

## 2017-02-03 LAB — CBC WITH DIFFERENTIAL/PLATELET
Basophils Absolute: 0 10*3/uL (ref 0.0–0.1)
Basophils Relative: 0 %
Eosinophils Absolute: 0.2 10*3/uL (ref 0.0–0.7)
Eosinophils Relative: 2 %
HCT: 37 % (ref 36.0–46.0)
Hemoglobin: 12 g/dL (ref 12.0–15.0)
Lymphocytes Relative: 35 %
Lymphs Abs: 2.7 10*3/uL (ref 0.7–4.0)
MCH: 28.2 pg (ref 26.0–34.0)
MCHC: 32.4 g/dL (ref 30.0–36.0)
MCV: 86.9 fL (ref 78.0–100.0)
Monocytes Absolute: 0.4 10*3/uL (ref 0.1–1.0)
Monocytes Relative: 6 %
Neutro Abs: 4.5 10*3/uL (ref 1.7–7.7)
Neutrophils Relative %: 57 %
Platelets: 230 10*3/uL (ref 150–400)
RBC: 4.26 MIL/uL (ref 3.87–5.11)
RDW: 13 % (ref 11.5–15.5)
WBC: 7.8 10*3/uL (ref 4.0–10.5)

## 2017-02-03 LAB — COMPREHENSIVE METABOLIC PANEL
ALT: 21 U/L (ref 14–54)
AST: 26 U/L (ref 15–41)
Albumin: 4 g/dL (ref 3.5–5.0)
Alkaline Phosphatase: 143 U/L — ABNORMAL HIGH (ref 38–126)
Anion gap: 9 (ref 5–15)
BUN: 29 mg/dL — ABNORMAL HIGH (ref 6–20)
CO2: 26 mmol/L (ref 22–32)
Calcium: 9.1 mg/dL (ref 8.9–10.3)
Chloride: 105 mmol/L (ref 101–111)
Creatinine, Ser: 1.69 mg/dL — ABNORMAL HIGH (ref 0.44–1.00)
GFR calc Af Amer: 38 mL/min — ABNORMAL LOW (ref >60)
GFR calc non Af Amer: 33 mL/min — ABNORMAL LOW (ref >60)
Glucose, Bld: 86 mg/dL (ref 65–99)
Potassium: 3.8 mmol/L (ref 3.5–5.1)
Sodium: 140 mmol/L (ref 135–145)
Total Bilirubin: 0.7 mg/dL (ref 0.3–1.2)
Total Protein: 8.5 g/dL — ABNORMAL HIGH (ref 6.5–8.1)

## 2017-02-03 LAB — I-STAT TROPONIN, ED: Troponin i, poc: 0.01 ng/mL (ref 0.00–0.08)

## 2017-02-03 MED ORDER — MECLIZINE HCL 25 MG PO TABS: 25.0000 mg | ORAL_TABLET | Freq: Three times a day (TID) | ORAL | 0 refills | Status: DC | PRN

## 2017-02-03 MED ORDER — ONDANSETRON HCL 4 MG/2ML IJ SOLN
4.0000 mg | Freq: Once | INTRAMUSCULAR | Status: AC
  Administered 2017-02-03: 4 mg via INTRAVENOUS
  Filled 2017-02-03: qty 2

## 2017-02-03 MED ORDER — MECLIZINE HCL 25 MG PO TABS
25.0000 mg | ORAL_TABLET | Freq: Once | ORAL | Status: AC
  Administered 2017-02-03: 25 mg via ORAL
  Filled 2017-02-03: qty 1

## 2017-02-03 MED ORDER — SODIUM CHLORIDE 0.9 % IV BOLUS (SEPSIS)
1000.0000 mL | Freq: Once | INTRAVENOUS | Status: AC
  Administered 2017-02-03: 1000 mL via INTRAVENOUS

## 2017-02-03 MED ORDER — ONDANSETRON 4 MG PO TBDP: ORAL_TABLET | ORAL | 0 refills | Status: DC

## 2017-02-03 NOTE — ED Notes (Signed)
Discharge instructions, follow up care, and rx x2 reviewed with patient. Patient verbalized understanding. 

## 2017-02-03 NOTE — ED Notes (Signed)
Patient transported to radiology

## 2017-02-03 NOTE — ED Notes (Addendum)
NT attempted blood draw x2 without success. Main hospital phlebotomist made aware. States they will come to attempt blood draw. Patient states she does not want MRI completed. EDP made aware.

## 2017-02-03 NOTE — ED Notes (Signed)
Pt is not being code stroke per [facility], Dr Roderic Palau at bedside.

## 2017-02-03 NOTE — ED Notes (Signed)
Attempted blood draw x2 unsuccessful. Another staff member to attempt blood draw.

## 2017-02-03 NOTE — Discharge Instructions (Signed)
Follow-up with your doctor next week for recheck. Return if getting worse

## 2017-02-03 NOTE — ED Triage Notes (Signed)
Per EMS pt woke up with vertigo, dizziness, right side weakness; then c/o right side mouth tingling, right side face numb,and right side of right vision blurred onset at 0600. Blind in left eye at baseline. Initial blood pressure 207/89. No confusion or changes in speech.

## 2017-02-03 NOTE — ED Provider Notes (Signed)
[facility] DEPT Provider Note   CSN: [id number] Arrival date & time: 02/03/17  0902     History   Chief Complaint Chief Complaint  Patient presents with  . Dizziness    HPI Rachel Holmes is a 56 y.o. female.  Patient complains of dizziness that started last night and some numbness to the right side of her face no other problems   The history is provided by the patient. No language interpreter was used.  Dizziness  Quality:  Lightheadedness Severity:  Moderate Onset quality:  Sudden Timing:  Constant Progression:  Waxing and waning Chronicity:  New Context: bending over   Associated symptoms: no chest pain, no diarrhea and no headaches     Past Medical History:  Diagnosis Date  . Breast mass    Patient can no longer palpate specific masses but showed tech general area of concern  . Diabetes mellitus without complication ([place])   . Hypertension   . Legally blind in left eye, as defined in [place]     There are no active problems to display for this patient.   Past Surgical History:  Procedure Laterality Date  . ABDOMINAL HYSTERECTOMY    . BREAST BIOPSY Left 2014   FNA 12:00 position - Negative  . EYE SURGERY      OB History    No data available       Home Medications    Prior to Admission medications   Medication Sig Start Date End Date Taking? Authorizing Provider  albuterol (PROVENTIL HFA;VENTOLIN HFA) 108 (90 Base) MCG/ACT inhaler Inhale 1-2 puffs into the lungs every 6 (six) hours as needed for wheezing or shortness of breath.   Yes Historical Provider, MD  amLODipine (NORVASC) 10 MG tablet Take 10 mg by mouth daily.   Yes Historical Provider, MD  aspirin EC 325 MG tablet Take 325 mg by mouth daily.   Yes Historical Provider, MD  cloNIDine (CATAPRES) 0.3 MG tablet Take 0.3 mg by mouth 2 (two) times daily.   Yes Historical Provider, MD  cyclobenzaprine (FLEXERIL) 10 MG tablet Take 1 tablet (10 mg total) by mouth 3 (three) times daily as needed for  muscle spasms. 10/23/15  Yes Cari B Triplett, FNP  diclofenac sodium (VOLTAREN) 1 % GEL Apply 2 g topically 4 (four) times daily as needed (for pain).   Yes Historical Provider, MD  Dulaglutide (TRULICITY) 1.5 FT/7.3UK SOPN Inject 1.5 mg into the skin every Sunday.   Yes Historical Provider, MD  ezetimibe (ZETIA) 10 MG tablet Take 10 mg by mouth at bedtime.   Yes Historical Provider, MD  ferrous sulfate 325 (65 FE) MG tablet Take 325 mg by mouth 3 (three) times daily with meals.   Yes Historical Provider, MD  fluticasone (FLONASE) 50 MCG/ACT nasal spray Place 1-2 sprays into both nostrils daily as needed for rhinitis.   Yes Historical Provider, MD  furosemide (LASIX) 20 MG tablet Take 20 mg by mouth daily.   Yes Historical Provider, MD  gabapentin (NEURONTIN) 600 MG tablet Take 600 mg by mouth at bedtime.   Yes Historical Provider, MD  hydrOXYzine (ATARAX/VISTARIL) 25 MG tablet Take 25 mg by mouth 3 (three) times daily as needed for itching.   Yes Historical Provider, MD  insulin aspart (NOVOLOG FLEXPEN) 100 UNIT/ML FlexPen Inject 4-12 Units into the skin 3 (three) times daily with meals as needed for high blood sugar. Pt uses as needed per sliding scale.   Yes Historical Provider, MD  Insulin Detemir (LEVEMIR FLEXTOUCH)  100 UNIT/ML Pen Inject 45 Units into the skin at bedtime.   Yes Historical Provider, MD  isosorbide mononitrate (IMDUR) 120 MG 24 hr tablet Take 120 mg by mouth daily.   Yes Historical Provider, MD  meloxicam (MOBIC) 15 MG tablet Take 15 mg by mouth at bedtime.   Yes Historical Provider, MD  Multiple Vitamin (MULTIVITAMIN WITH MINERALS) TABS tablet Take 1 tablet by mouth daily.   Yes Historical Provider, MD  omeprazole (PRILOSEC) 40 MG capsule Take 40 mg by mouth 2 (two) times daily.   Yes Historical Provider, MD  ranitidine (ZANTAC) 300 MG tablet Take 300 mg by mouth at bedtime.   Yes Historical Provider, MD  vitamin B-12 (CYANOCOBALAMIN) 1000 MCG tablet Take 1,000 mcg by mouth  daily.   Yes Historical Provider, MD  meclizine (ANTIVERT) 25 MG tablet Take 1 tablet (25 mg total) by mouth 3 (three) times daily as needed for dizziness. 02/03/17   Milton Ferguson, MD  ondansetron (ZOFRAN ODT) 4 MG disintegrating tablet 4mg  ODT q4 hours prn nausea/vomit 02/03/17   Milton Ferguson, MD    Family History Family History  Problem Relation Age of Onset  . Breast cancer Sister 34    Social History Social History  Substance Use Topics  . Smoking status: Never Smoker  . Smokeless tobacco: Not on file  . Alcohol use No     Allergies   Codeine; Ibuprofen; Penicillins; Percocet [oxycodone-acetaminophen]; Tramadol; and Vicodin [hydrocodone-acetaminophen]   Review of Systems Review of Systems  Constitutional: Negative for appetite change and fatigue.  HENT: Negative for congestion, ear discharge and sinus pressure.   Eyes: Negative for discharge.  Respiratory: Negative for cough.   Cardiovascular: Negative for chest pain.  Gastrointestinal: Negative for abdominal pain and diarrhea.  Genitourinary: Negative for frequency and hematuria.  Musculoskeletal: Negative for back pain.  Skin: Negative for rash.  Neurological: Positive for dizziness. Negative for seizures and headaches.  Psychiatric/Behavioral: Negative for hallucinations.     Physical Exam Updated Vital Signs BP (!) 184/77   Pulse (!) 56   Temp 97.6 F (36.4 C)   Resp 18   SpO2 97%   Physical Exam  Constitutional: She is oriented to person, place, and time. She appears well-developed.  HENT:  Head: Normocephalic.  Eyes: Conjunctivae and EOM are normal. No scleral icterus.  Neck: Neck supple. No thyromegaly present.  Cardiovascular: Normal rate and regular rhythm.  Exam reveals no gallop and no friction rub.   No murmur heard. Pulmonary/Chest: No stridor. She has no wheezes. She has no rales. She exhibits no tenderness.  Abdominal: She exhibits no distension. There is no tenderness. There is no rebound.    Musculoskeletal: Normal range of motion. She exhibits no edema.  Lymphadenopathy:    She has no cervical adenopathy.  Neurological: She is oriented to person, place, and time. She exhibits normal muscle tone. Coordination normal.  Skin: No rash noted. No erythema.  Psychiatric: She has a normal mood and affect. Her behavior is normal.     ED Treatments / Results  Labs (all labs ordered are listed, but only abnormal results are displayed) Labs Reviewed  COMPREHENSIVE METABOLIC PANEL - Abnormal; Notable for the following:       Result Value   BUN 29 (*)    Creatinine, Ser 1.69 (*)    Total Protein 8.5 (*)    Alkaline Phosphatase 143 (*)    GFR calc non Af Amer 33 (*)    GFR calc Af Amer 38 (*)  All other components within normal limits  URINALYSIS, ROUTINE W REFLEX MICROSCOPIC - Abnormal; Notable for the following:    Color, Urine STRAW (*)    Hgb urine dipstick SMALL (*)    Protein, ur 30 (*)    Bacteria, UA RARE (*)    Squamous Epithelial / LPF 0-5 (*)    All other components within normal limits  CBC WITH DIFFERENTIAL/PLATELET  I-STAT TROPOININ, ED    EKG  EKG Interpretation  Date/Time:  Monday February 03 2017 09:21:35 EDT Ventricular Rate:  59 PR Interval:    QRS Duration: 93 QT Interval:  456 QTC Calculation: 452 R Axis:   33 Text Interpretation:  Sinus rhythm Probable LVH with secondary repol abnrm Confirmed by Tylee Yum  MD, Sheralee Qazi [phone]) on 02/03/2017 2:18:58 PM       Radiology Dg Chest 2 View  Result Date: 02/03/2017 CLINICAL DATA:  Dizziness EXAM: CHEST  2 VIEW COMPARISON:  10/23/2015 FINDINGS: The heart size and mediastinal contours are within normal limits. Both lungs are clear. The visualized skeletal structures are unremarkable. IMPRESSION: No active cardiopulmonary disease. Electronically Signed   By: Franchot Gallo M.D.   On: 02/03/2017 10:28   Ct Head Wo Contrast  Result Date: 02/03/2017 CLINICAL DATA:  Patient woke up with vertigo and dizziness along  with RIGHT-sided weakness. Elevated blood pressure. History of hypertension and diabetes. EXAM: CT HEAD WITHOUT CONTRAST TECHNIQUE: Contiguous axial images were obtained from the base of the skull through the vertex without intravenous contrast. COMPARISON:  CT head 10/21/2013. FINDINGS: Brain: No evidence for acute infarction, hemorrhage, mass lesion, hydrocephalus, or extra-axial fluid. Normal for age cerebral volume. Patchy areas of hypoattenuation of white matter are noted, suggesting chronic microvascular ischemic change. Vascular: No intracranial hyperdense vessel. Moderately advanced calcifications of the BILATERAL internal carotid artery cavernous segments and BILATERAL distal vertebral artery segments. Skull: Normal. Negative for fracture or focal lesion. Sinuses/Orbits: No acute finding. Other: None. Compared with priors, slight progression of small vessel disease is suspected. IMPRESSION: Mild small vessel disease.  Calcific intracranial atherosclerosis. No acute intracranial findings are evident. If further investigation desired, and no contraindications, MRI of the brain recommended for further evaluation. Electronically Signed   By: Staci Righter M.D.   On: 02/03/2017 10:37    Procedures Procedures (including critical care time)  Medications Ordered in ED Medications  ondansetron (ZOFRAN) injection 4 mg (4 mg Intravenous Given 02/03/17 1056)  sodium chloride 0.9 % bolus 1,000 mL (1,000 mLs Intravenous New Bag/Given 02/03/17 1056)  meclizine (ANTIVERT) tablet 25 mg (25 mg Oral Given 02/03/17 1249)     Initial Impression / Assessment and Plan / ED Course  I have reviewed the triage vital signs and the nursing notes.  Pertinent labs & imaging results that were available during my care of the patient were reviewed by me and considered in my medical decision making (see chart for details).     Labs unremarkable. Patient improved some with Antivert. Patient refused MRI scan. Doubt this is a  small CVA. Suspect viral problem. Patient given Antivert and Zofran and will follow-up with her PCP next week  Final Clinical Impressions(s) / ED Diagnoses   Final diagnoses:  Dizziness    New Prescriptions New Prescriptions   MECLIZINE (ANTIVERT) 25 MG TABLET    Take 1 tablet (25 mg total) by mouth 3 (three) times daily as needed for dizziness.   ONDANSETRON (ZOFRAN ODT) 4 MG DISINTEGRATING TABLET    4mg  ODT q4 hours prn nausea/vomit  Milton Ferguson, MD 02/03/17 [phone]

## 2017-02-11 DIAGNOSIS — M2041 Other hammer toe(s) (acquired), right foot: Secondary | ICD-10-CM | POA: Diagnosis not present

## 2017-02-11 DIAGNOSIS — L84 Corns and callosities: Secondary | ICD-10-CM | POA: Diagnosis not present

## 2017-02-11 DIAGNOSIS — B351 Tinea unguium: Secondary | ICD-10-CM | POA: Diagnosis not present

## 2017-02-11 DIAGNOSIS — I739 Peripheral vascular disease, unspecified: Secondary | ICD-10-CM | POA: Diagnosis not present

## 2017-02-11 DIAGNOSIS — M2042 Other hammer toe(s) (acquired), left foot: Secondary | ICD-10-CM | POA: Diagnosis not present

## 2017-02-11 DIAGNOSIS — M79671 Pain in right foot: Secondary | ICD-10-CM | POA: Diagnosis not present

## 2017-02-11 DIAGNOSIS — M79672 Pain in left foot: Secondary | ICD-10-CM | POA: Diagnosis not present

## 2017-02-11 DIAGNOSIS — E1142 Type 2 diabetes mellitus with diabetic polyneuropathy: Secondary | ICD-10-CM | POA: Diagnosis not present

## 2017-03-07 DIAGNOSIS — M2142 Flat foot [pes planus] (acquired), left foot: Secondary | ICD-10-CM | POA: Diagnosis not present

## 2017-03-07 DIAGNOSIS — M722 Plantar fascial fibromatosis: Secondary | ICD-10-CM | POA: Diagnosis not present

## 2017-03-07 DIAGNOSIS — M79672 Pain in left foot: Secondary | ICD-10-CM | POA: Diagnosis not present

## 2017-03-07 DIAGNOSIS — N183 Chronic kidney disease, stage 3 (moderate): Secondary | ICD-10-CM | POA: Diagnosis not present

## 2017-03-07 DIAGNOSIS — G5792 Unspecified mononeuropathy of left lower limb: Secondary | ICD-10-CM | POA: Diagnosis not present

## 2017-03-07 DIAGNOSIS — E1122 Type 2 diabetes mellitus with diabetic chronic kidney disease: Secondary | ICD-10-CM | POA: Diagnosis not present

## 2017-03-07 DIAGNOSIS — E11610 Type 2 diabetes mellitus with diabetic neuropathic arthropathy: Secondary | ICD-10-CM | POA: Diagnosis not present

## 2017-03-07 DIAGNOSIS — Z794 Long term (current) use of insulin: Secondary | ICD-10-CM | POA: Diagnosis not present

## 2017-03-07 DIAGNOSIS — I890 Lymphedema, not elsewhere classified: Secondary | ICD-10-CM | POA: Diagnosis not present

## 2017-03-07 DIAGNOSIS — M2141 Flat foot [pes planus] (acquired), right foot: Secondary | ICD-10-CM | POA: Diagnosis not present

## 2017-03-21 DIAGNOSIS — T148XXA Other injury of unspecified body region, initial encounter: Secondary | ICD-10-CM | POA: Diagnosis not present

## 2017-03-26 DIAGNOSIS — M79672 Pain in left foot: Secondary | ICD-10-CM | POA: Diagnosis not present

## 2017-03-26 DIAGNOSIS — E11610 Type 2 diabetes mellitus with diabetic neuropathic arthropathy: Secondary | ICD-10-CM | POA: Diagnosis not present

## 2017-03-26 DIAGNOSIS — M79671 Pain in right foot: Secondary | ICD-10-CM | POA: Diagnosis not present

## 2017-04-08 DIAGNOSIS — M170 Bilateral primary osteoarthritis of knee: Secondary | ICD-10-CM | POA: Diagnosis not present

## 2017-04-17 DIAGNOSIS — R112 Nausea with vomiting, unspecified: Secondary | ICD-10-CM | POA: Diagnosis not present

## 2017-04-18 DIAGNOSIS — Z794 Long term (current) use of insulin: Secondary | ICD-10-CM | POA: Diagnosis not present

## 2017-04-18 DIAGNOSIS — N183 Chronic kidney disease, stage 3 (moderate): Secondary | ICD-10-CM | POA: Diagnosis not present

## 2017-04-18 DIAGNOSIS — E11610 Type 2 diabetes mellitus with diabetic neuropathic arthropathy: Secondary | ICD-10-CM | POA: Diagnosis not present

## 2017-04-18 DIAGNOSIS — E1122 Type 2 diabetes mellitus with diabetic chronic kidney disease: Secondary | ICD-10-CM | POA: Diagnosis not present

## 2017-05-08 DIAGNOSIS — L84 Corns and callosities: Secondary | ICD-10-CM | POA: Diagnosis not present

## 2017-05-08 DIAGNOSIS — I739 Peripheral vascular disease, unspecified: Secondary | ICD-10-CM | POA: Diagnosis not present

## 2017-05-08 DIAGNOSIS — B351 Tinea unguium: Secondary | ICD-10-CM | POA: Diagnosis not present

## 2017-05-08 DIAGNOSIS — E1142 Type 2 diabetes mellitus with diabetic polyneuropathy: Secondary | ICD-10-CM | POA: Diagnosis not present

## 2017-05-23 DIAGNOSIS — R609 Edema, unspecified: Secondary | ICD-10-CM | POA: Diagnosis not present

## 2017-05-23 DIAGNOSIS — E1165 Type 2 diabetes mellitus with hyperglycemia: Secondary | ICD-10-CM | POA: Diagnosis not present

## 2017-05-23 DIAGNOSIS — N183 Chronic kidney disease, stage 3 (moderate): Secondary | ICD-10-CM | POA: Diagnosis not present

## 2017-05-23 DIAGNOSIS — M14672 Charcot's joint, left ankle and foot: Secondary | ICD-10-CM | POA: Diagnosis not present

## 2017-05-23 DIAGNOSIS — E1122 Type 2 diabetes mellitus with diabetic chronic kidney disease: Secondary | ICD-10-CM | POA: Diagnosis not present

## 2017-05-23 DIAGNOSIS — I10 Essential (primary) hypertension: Secondary | ICD-10-CM | POA: Diagnosis not present

## 2017-05-23 DIAGNOSIS — Z794 Long term (current) use of insulin: Secondary | ICD-10-CM | POA: Diagnosis not present

## 2017-05-23 DIAGNOSIS — E1142 Type 2 diabetes mellitus with diabetic polyneuropathy: Secondary | ICD-10-CM | POA: Diagnosis not present

## 2017-05-27 DIAGNOSIS — G609 Hereditary and idiopathic neuropathy, unspecified: Secondary | ICD-10-CM | POA: Diagnosis not present

## 2017-05-27 DIAGNOSIS — R600 Localized edema: Secondary | ICD-10-CM | POA: Diagnosis not present

## 2017-06-03 DIAGNOSIS — M545 Low back pain: Secondary | ICD-10-CM | POA: Diagnosis not present

## 2017-06-03 DIAGNOSIS — N183 Chronic kidney disease, stage 3 (moderate): Secondary | ICD-10-CM | POA: Diagnosis not present

## 2017-06-03 DIAGNOSIS — R350 Frequency of micturition: Secondary | ICD-10-CM | POA: Diagnosis not present

## 2017-06-03 DIAGNOSIS — M62830 Muscle spasm of back: Secondary | ICD-10-CM | POA: Diagnosis not present

## 2017-06-03 DIAGNOSIS — E1122 Type 2 diabetes mellitus with diabetic chronic kidney disease: Secondary | ICD-10-CM | POA: Diagnosis not present

## 2017-06-03 DIAGNOSIS — Z794 Long term (current) use of insulin: Secondary | ICD-10-CM | POA: Diagnosis not present

## 2017-06-19 DIAGNOSIS — R52 Pain, unspecified: Secondary | ICD-10-CM | POA: Diagnosis not present

## 2017-07-11 DIAGNOSIS — I10 Essential (primary) hypertension: Secondary | ICD-10-CM | POA: Diagnosis not present

## 2017-08-27 ENCOUNTER — Encounter: Payer: Self-pay | Admitting: Endocrinology

## 2017-08-27 ENCOUNTER — Ambulatory Visit (INDEPENDENT_AMBULATORY_CARE_PROVIDER_SITE_OTHER): Payer: Medicare Other | Admitting: Endocrinology

## 2017-08-27 DIAGNOSIS — M14672 Charcot's joint, left ankle and foot: Secondary | ICD-10-CM

## 2017-08-27 DIAGNOSIS — Z23 Encounter for immunization: Secondary | ICD-10-CM | POA: Diagnosis not present

## 2017-08-27 MED ORDER — INSULIN ASPART 100 UNIT/ML FLEXPEN: 8.0000 [IU] | PEN_INJECTOR | Freq: Three times a day (TID) | SUBCUTANEOUS | 11 refills | Status: DC | PRN

## 2017-08-27 NOTE — Progress Notes (Signed)
Subjective:    Patient ID: Rachel Holmes, female    DOB: [birth year], 56 y.o.   MRN: [id number]  HPI pt is referred by Dr Iona Beard, for diabetes.  Pt states DM was dx'ed in 1988, during a pregnancy, but it persisted after; he has moderate neuropathy of the lower extremities; she has associated renal failure, leg ulcer, and PDR; she has been on insulin since soon after dx; pt says her diet and exercise are not good; she has never had pancreatitis, pancreatic surgery, or DKA.  She has had 2 episodes of severe hypoglycemia (both many years ago).  She takes levemir, 45 units QHS, trulicity, and prn novolog (average of approx 15 total per day).  She has mild hypoglycemia approx once per week.  This happens fasting.  She wants to d/c trulicity Past Medical History:  Diagnosis Date  . Breast mass    Patient can no longer palpate specific masses but showed tech general area of concern  . Diabetes mellitus without complication ([place])   . Hypertension   . Legally blind in left eye, as defined in [place]     Past Surgical History:  Procedure Laterality Date  . ABDOMINAL HYSTERECTOMY    . BREAST BIOPSY Left 2014   FNA 12:00 position - Negative  . EYE SURGERY      Social History   Social History  . Marital status: Divorced    Spouse name: N/A  . Number of children: N/A  . Years of education: N/A   Occupational History  . Not on file.   Social History Main Topics  . Smoking status: Never Smoker  . Smokeless tobacco: Never Used  . Alcohol use No  . Drug use: Unknown  . Sexual activity: Not on file   Other Topics Concern  . Not on file   Social History Narrative  . No narrative on file    Current Outpatient Prescriptions on File Prior to Visit  Medication Sig Dispense Refill  . albuterol (PROVENTIL HFA;VENTOLIN HFA) 108 (90 Base) MCG/ACT inhaler Inhale 1-2 puffs into the lungs every 6 (six) hours as needed for wheezing or shortness of breath.    [name] amLODipine (NORVASC) 10 MG tablet  Take 10 mg by mouth daily.    [name] aspirin EC 325 MG tablet Take 325 mg by mouth daily.    . cloNIDine (CATAPRES) 0.3 MG tablet Take 0.3 mg by mouth 2 (two) times daily.    . cyclobenzaprine (FLEXERIL) 10 MG tablet Take 1 tablet (10 mg total) by mouth 3 (three) times daily as needed for muscle spasms. 30 tablet 0  . diclofenac sodium (VOLTAREN) 1 % GEL Apply 2 g topically 4 (four) times daily as needed (for pain).    [name] ezetimibe (ZETIA) 10 MG tablet Take 10 mg by mouth at bedtime.    . ferrous sulfate 325 (65 FE) MG tablet Take 325 mg by mouth 3 (three) times daily with meals.    . fluticasone (FLONASE) 50 MCG/ACT nasal spray Place 1-2 sprays into both nostrils daily as needed for rhinitis.    . furosemide (LASIX) 20 MG tablet Take 20 mg by mouth daily.    [name] gabapentin (NEURONTIN) 600 MG tablet Take 600 mg by mouth at bedtime.    . hydrOXYzine (ATARAX/VISTARIL) 25 MG tablet Take 25 mg by mouth 3 (three) times daily as needed for itching.    . Insulin Detemir (LEVEMIR FLEXTOUCH) 100 UNIT/ML Pen Inject 45 Units into the skin at bedtime.    [name]  isosorbide mononitrate (IMDUR) 120 MG 24 hr tablet Take 120 mg by mouth daily.    . meclizine (ANTIVERT) 25 MG tablet Take 1 tablet (25 mg total) by mouth 3 (three) times daily as needed for dizziness. 15 tablet 0  . meloxicam (MOBIC) 15 MG tablet Take 15 mg by mouth at bedtime.    . Multiple Vitamin (MULTIVITAMIN WITH MINERALS) TABS tablet Take 1 tablet by mouth daily.    [name] omeprazole (PRILOSEC) 40 MG capsule Take 40 mg by mouth 2 (two) times daily.    . ondansetron (ZOFRAN ODT) 4 MG disintegrating tablet 4mg  ODT q4 hours prn nausea/vomit 10 tablet 0  . ranitidine (ZANTAC) 300 MG tablet Take 300 mg by mouth at bedtime.    . vitamin B-12 (CYANOCOBALAMIN) 1000 MCG tablet Take 1,000 mcg by mouth daily.     No current facility-administered medications on file prior to visit.     Allergies  Allergen Reactions  . Codeine Nausea Only  . Ibuprofen Other (See  Comments)    Reaction:  Raises pts BP  . Penicillins Hives and Other (See Comments)    Has patient had a PCN reaction causing immediate rash, facial/tongue/throat swelling, SOB or lightheadedness with hypotension: No Has patient had a PCN reaction causing severe rash involving mucus membranes or skin necrosis: No Has patient had a PCN reaction that required hospitalization No Has patient had a PCN reaction occurring within the last 10 years: No If all of the above answers are "NO", then may proceed with Cephalosporin use.  [name] Percocet [Oxycodone-Acetaminophen] Nausea Only  . Tramadol Nausea Only  . Vicodin [Hydrocodone-Acetaminophen] Nausea Only    Family History  Problem Relation Age of Onset  . Breast cancer Sister 6  . Diabetes Sister   . Diabetes Mother     BP 124/78   Pulse 81   Wt 298 lb (135.2 kg)   SpO2 95%   BMI 45.31 kg/m    Review of Systems denies weight loss, headache, chest pain, sob, muscle cramps, vomiting, memory loss, depression, cold intolerance, and rhinorrhea.  She has severe visual loss, easy bruising, dry skin, leg cramps, nausea, and frequent urination.      Objective:   Physical Exam VS: see vs page GEN: no distress HEAD: head: no deformity eyes: no periorbital swelling, no proptosis.   external nose and ears are normal mouth: no lesion seen NECK: supple, thyroid is not enlarged CHEST WALL: no deformity LUNGS: clear to auscultation CV: reg rate and rhythm, no murmur ABD: abdomen is soft, nontender.  no hepatosplenomegaly.  not distended.  no hernia MUSCULOSKELETAL: muscle bulk and strength are grossly normal.  no obvious joint swelling.  gait is normal and steady EXTEMITIES: no deformity.  no ulcer on the feet.  feet are of normal color and temp.  1+ left leg edema (trace on the right).  There is bilateral onychomycosis of the toenails PULSES: dorsalis pedis intact bilat.  no carotid bruit NEURO:  cn 2-12 grossly intact.   readily moves all 4's.   sensation is intact to touch on the feet, but decreased from normal SKIN:  Normal texture and temperature.  No rash or suspicious lesion is visible.   NODES:  None palpable at the neck PSYCH: alert, well-oriented.  Does not appear anxious nor depressed.   A1c=7.5%  I have reviewed outside records, and summarized: Pt was noted to have elevated a1c, and referred here.  He was noted to have left charcot ankle/foot  Lab Results  Component Value Date   CREATININE 1.69 (H) 02/03/2017   BUN 29 (H) 02/03/2017   NA 140 02/03/2017   K 3.8 02/03/2017   CL 105 02/03/2017   CO2 26 02/03/2017       Assessment & Plan:  Insulin-requiring type 2 DM: she needs increased rx, if it can be done with a regimen that avoids or minimizes hypoglycemia. Renal failure: she may not need basal insulin.    Patient Instructions  good diet and exercise significantly improve the control of your diabetes.  please let me know if you wish to be referred to a dietician.  high blood sugar is very risky to your health.  you should see an eye doctor and dentist every year.  It is very important to get all recommended vaccinations.  Controlling your blood pressure and cholesterol drastically reduces the damage diabetes does to your body.  Those who smoke should quit.  Please discuss these with your doctor.  check your blood sugar twice a day.  vary the time of day when you check, between before the 3 meals, and at bedtime.  also check if you have symptoms of your blood sugar being too high or too low.  please keep a record of the readings and bring it to your next appointment here (or you can bring the meter itself).  You can write it on any piece of paper.  please call us sooner if your blood sugar goes below 70, or if you have a lot of readings over 200. For now, please: Stop taking the trulicity, and: Increase the novolog to 8 units 3 times a day (just before each meal, no matter what your blood sugar is), and: continue  the same levemir. Please call or message [name] next week, to tell us how the blood sugar is doing.  Please come back for a follow-up appointment in 1 month.

## 2017-08-27 NOTE — Patient Instructions (Signed)
good diet and exercise significantly improve the control of your diabetes.  please let me know if you wish to be referred to a dietician.  high blood sugar is very risky to your health.  you should see an eye doctor and dentist every year.  It is very important to get all recommended vaccinations.  Controlling your blood pressure and cholesterol drastically reduces the damage diabetes does to your body.  Those who smoke should quit.  Please discuss these with your doctor.  check your blood sugar twice a day.  vary the time of day when you check, between before the 3 meals, and at bedtime.  also check if you have symptoms of your blood sugar being too high or too low.  please keep a record of the readings and bring it to your next appointment here (or you can bring the meter itself).  You can write it on any piece of paper.  please call us sooner if your blood sugar goes below 70, or if you have a lot of readings over 200. For now, please: Stop taking the trulicity, and: Increase the novolog to 8 units 3 times a day (just before each meal, no matter what your blood sugar is), and: continue the same levemir. Please call or message [name] next week, to tell us how the blood sugar is doing.  Please come back for a follow-up appointment in 1 month.

## 2017-08-29 DIAGNOSIS — M14672 Charcot's joint, left ankle and foot: Secondary | ICD-10-CM | POA: Insufficient documentation

## 2017-09-11 DIAGNOSIS — S92243D Displaced fracture of medial cuneiform of unspecified foot, subsequent encounter for fracture with routine healing: Secondary | ICD-10-CM | POA: Diagnosis not present

## 2017-09-11 DIAGNOSIS — S92253D Displaced fracture of navicular [scaphoid] of unspecified foot, subsequent encounter for fracture with routine healing: Secondary | ICD-10-CM | POA: Diagnosis not present

## 2017-09-16 ENCOUNTER — Telehealth: Payer: Self-pay

## 2017-09-16 ENCOUNTER — Telehealth: Payer: Self-pay | Admitting: *Deleted

## 2017-09-16 MED ORDER — GLUCOSE BLOOD VI STRP: ORAL_STRIP | 12 refills | Status: DC

## 2017-09-16 NOTE — Telephone Encounter (Signed)
I tried to call patient to find out if he strips were just the one touch ultra because I didn't see a slim. I didn't receive an answer. So, I have sent those in to her pharmacy.

## 2017-09-16 NOTE — Telephone Encounter (Signed)
Patient called and states she needs a refill of her OneTouch Ultra slim test strip. [facility] is [facility] [address]. Please Advise. Thank you

## 2017-09-17 NOTE — Telephone Encounter (Signed)
Follow up  Pt verbalized pharmacy is needing the diagnosis and the dosage amount resent.  Please f/u

## 2017-09-17 NOTE — Telephone Encounter (Signed)
Patient needs prescription for test strips sent to pharmacy-([facility] [address]) with a diagnostic code. Prior prescription did not have diagnostic code so insurance company would not cover it. Also along with diagnostic code pharmacy needs to know how many times per day to use test strips

## 2017-09-18 ENCOUNTER — Other Ambulatory Visit: Payer: Self-pay

## 2017-09-18 DIAGNOSIS — S92255D Nondisplaced fracture of navicular [scaphoid] of left foot, subsequent encounter for fracture with routine healing: Secondary | ICD-10-CM | POA: Diagnosis not present

## 2017-09-18 DIAGNOSIS — M1460 Charcot's joint, unspecified site: Secondary | ICD-10-CM | POA: Diagnosis not present

## 2017-09-18 MED ORDER — GLUCOSE BLOOD VI STRP: ORAL_STRIP | 12 refills | Status: DC

## 2017-09-19 ENCOUNTER — Other Ambulatory Visit: Payer: Self-pay

## 2017-09-19 NOTE — Telephone Encounter (Signed)
Done

## 2017-09-22 ENCOUNTER — Ambulatory Visit (INDEPENDENT_AMBULATORY_CARE_PROVIDER_SITE_OTHER): Payer: Medicare Other | Admitting: Podiatry

## 2017-09-22 ENCOUNTER — Encounter: Payer: Self-pay | Admitting: Podiatry

## 2017-09-22 VITALS — BP 174/71 | HR 74

## 2017-09-22 DIAGNOSIS — E1142 Type 2 diabetes mellitus with diabetic polyneuropathy: Secondary | ICD-10-CM | POA: Diagnosis not present

## 2017-09-22 DIAGNOSIS — M79674 Pain in right toe(s): Secondary | ICD-10-CM | POA: Diagnosis not present

## 2017-09-22 DIAGNOSIS — B351 Tinea unguium: Secondary | ICD-10-CM

## 2017-09-22 DIAGNOSIS — M79675 Pain in left toe(s): Secondary | ICD-10-CM

## 2017-09-22 NOTE — Patient Instructions (Signed)

## 2017-09-22 NOTE — Progress Notes (Signed)
   Subjective:    Patient ID: Rachel Holmes, female    DOB: Apr 09, 1961, 56 y.o.   MRN: [id number]  HPI  This patient presents today requesting debridement of toenails which are thick and elongated and are comfortable and she's unable to trim the nails herself. She describes podiatric care in [place] for this service the last service in July 2018. She also relates a history of Charcot's foot left treated in [facility] and 2018 and was discharged from treatment per patient approximately 1 month after treatment was initiated. In the last month or 2 patient said that her left foot became swollen again and she is visiting a local podiatrist Dr. time who is managing her care for left Charcot foot. HC she's currently wearing a boot immobilization and treatment of the left Charcot's foot and has follow-up appointment with podiatrist  Patient diabetic 30 years and denies ulceration, claudication or amputation Denies smoking history  Review of Systems  All other systems reviewed and are negative.      Objective:   Physical Exam  Patient estimates weighted 296 pounds 5 foot 8 inches  Orientated 3  DP pulse 1/4 bilaterally PT pulses 1/4 bilaterally Reflex within normal lives bilaterally Peripheral edema left greater than right  Neurological: Sensation to 10 g monofilament wire intact 5/8 bilaterally Vibratory sensation nonreactive bilaterally Ankle reflexes reactive bilaterally  Dermatological: No skin lesions bilaterally Atrophic skin with absent hair growth bilaterally Absent second left toenail Remaining toenails 9 or elongated, brittle, deformed, discolored to tender direct palpation 6-10 There is no warmth in the lower extremities  Musculoskeletal: Pes planus bilaterally There may be slight plantar prominence left compared to right but is very minimal No deformities noted left foot or ankle       Assessment & Plan:   Assessment: Diabetic peripheral  neuropathy History of Charcot's foot left, under active treatment by [facility]. [facility] Deformed mycotic toenails 9  Plan: Debridement toenails 9 mechanically electrically without a bleeding  Reappoint at patient's request sore three-month intervals

## 2017-10-02 ENCOUNTER — Encounter: Payer: Self-pay | Admitting: Endocrinology

## 2017-10-02 ENCOUNTER — Ambulatory Visit (INDEPENDENT_AMBULATORY_CARE_PROVIDER_SITE_OTHER): Payer: Medicare Other | Admitting: Endocrinology

## 2017-10-02 DIAGNOSIS — G8929 Other chronic pain: Secondary | ICD-10-CM | POA: Diagnosis not present

## 2017-10-02 DIAGNOSIS — M1460 Charcot's joint, unspecified site: Secondary | ICD-10-CM | POA: Diagnosis not present

## 2017-10-02 DIAGNOSIS — M25561 Pain in right knee: Secondary | ICD-10-CM | POA: Diagnosis not present

## 2017-10-02 DIAGNOSIS — M25569 Pain in unspecified knee: Secondary | ICD-10-CM

## 2017-10-02 DIAGNOSIS — N183 Chronic kidney disease, stage 3 unspecified: Secondary | ICD-10-CM

## 2017-10-02 DIAGNOSIS — E1122 Type 2 diabetes mellitus with diabetic chronic kidney disease: Secondary | ICD-10-CM | POA: Diagnosis not present

## 2017-10-02 DIAGNOSIS — Z794 Long term (current) use of insulin: Secondary | ICD-10-CM

## 2017-10-02 DIAGNOSIS — E669 Obesity, unspecified: Secondary | ICD-10-CM | POA: Insufficient documentation

## 2017-10-02 DIAGNOSIS — S92255D Nondisplaced fracture of navicular [scaphoid] of left foot, subsequent encounter for fracture with routine healing: Secondary | ICD-10-CM | POA: Diagnosis not present

## 2017-10-02 MED ORDER — INSULIN DETEMIR 100 UNIT/ML FLEXPEN: 10.0000 [IU] | PEN_INJECTOR | Freq: Every day | SUBCUTANEOUS | 11 refills | Status: DC

## 2017-10-02 MED ORDER — INSULIN ASPART 100 UNIT/ML FLEXPEN: 10.0000 [IU] | PEN_INJECTOR | Freq: Three times a day (TID) | SUBCUTANEOUS | 11 refills | Status: DC | PRN

## 2017-10-02 NOTE — Patient Instructions (Addendum)
check your blood sugar twice a day.  vary the time of day when you check, between before the 3 meals, and at bedtime.  also check if you have symptoms of your blood sugar being too high or too low.  please keep a record of the readings and bring it to your next appointment here (or you can bring the meter itself).  You can write it on any piece of paper.  please call us sooner if your blood sugar goes below 70, or if you have a lot of readings over 200. For now, please: Increase the novolog to 10 units 3 times a day (just before each meal, no matter what your blood sugar is), and: continue the same levemir to 10 units at bedtime.  Please see the 2 specialists we discussed.  you will receive a phone call, about days and times for appointments.  Please come back for a follow-up appointment in 2 months.

## 2017-10-02 NOTE — Progress Notes (Signed)
Subjective:    Patient ID: Rachel Holmes, female    DOB: [birth year], 56 y.o.   MRN: [id number]  HPI Pt returns for f/u of diabetes mellitus: DM type: Insulin-requiring type 2 Dx'ed: 1988, during a pregnancy, but it persisted after Complications: polyneuropathy, renal failure, leg ulcer, and PDR Therapy: insulin since soon after dx GDM: never DKA: never Severe hypoglycemia: 2 episodes (both many years ago) Pancreatitis: never Pancreatic imaging:  Other: she takes multiple daily injections; she requested to d/c trulicity Interval history: he takes levemir, 22 units qhs, and novolog 6 units 3 times a day (just before each meal).  no cbg record, but states cbg's are mildly low approx twice per week.  This happens fasting.  She says cbg's are as high as the 200's later in the day.   Past Medical History:  Diagnosis Date  . Breast mass    Patient can no longer palpate specific masses but showed tech general area of concern  . Diabetes mellitus without complication ([place])   . Hypertension   . Legally blind in left eye, as defined in [place]     Past Surgical History:  Procedure Laterality Date  . ABDOMINAL HYSTERECTOMY    . BREAST BIOPSY Left 2014   FNA 12:00 position - Negative  . EYE SURGERY      Social History   Socioeconomic History  . Marital status: Divorced    Spouse name: Not on file  . Number of children: Not on file  . Years of education: Not on file  . Highest education level: Not on file  Social Needs  . Financial resource strain: Not on file  . Food insecurity - worry: Not on file  . Food insecurity - inability: Not on file  . Transportation needs - medical: Not on file  . Transportation needs - non-medical: Not on file  Occupational History  . Not on file  Tobacco Use  . Smoking status: Never Smoker  . Smokeless tobacco: Never Used  Substance and Sexual Activity  . Alcohol use: No  . Drug use: Not on file  . Sexual activity: Not on file  Other Topics  Concern  . Not on file  Social History Narrative  . Not on file    Current Outpatient Medications on File Prior to Visit  Medication Sig Dispense Refill  . albuterol (PROVENTIL HFA;VENTOLIN HFA) 108 (90 Base) MCG/ACT inhaler Inhale 1-2 puffs into the lungs every 6 (six) hours as needed for wheezing or shortness of breath.    [name] amLODipine (NORVASC) 10 MG tablet Take 10 mg by mouth daily.    [name] aspirin EC 325 MG tablet Take 325 mg by mouth daily.    . cloNIDine (CATAPRES) 0.3 MG tablet Take 0.3 mg by mouth 2 (two) times daily.    . cyclobenzaprine (FLEXERIL) 10 MG tablet Take 1 tablet (10 mg total) by mouth 3 (three) times daily as needed for muscle spasms. 30 tablet 0  . diclofenac sodium (VOLTAREN) 1 % GEL Apply 2 g topically 4 (four) times daily as needed (for pain).    [name] ezetimibe (ZETIA) 10 MG tablet Take 10 mg by mouth at bedtime.    . ferrous sulfate 325 (65 FE) MG tablet Take 325 mg by mouth 3 (three) times daily with meals.    . fluticasone (FLONASE) 50 MCG/ACT nasal spray Place 1-2 sprays into both nostrils daily as needed for rhinitis.    . furosemide (LASIX) 20 MG tablet Take 20 mg by  mouth daily.    [name] gabapentin (NEURONTIN) 600 MG tablet Take 600 mg by mouth at bedtime.    [name] glucose blood (ONE TOUCH ULTRA TEST) test strip Use as instructed daily DX-E11.9 100 each 12  . hydrOXYzine (ATARAX/VISTARIL) 25 MG tablet Take 25 mg by mouth 3 (three) times daily as needed for itching.    . isosorbide mononitrate (IMDUR) 120 MG 24 hr tablet Take 120 mg by mouth daily.    . meclizine (ANTIVERT) 25 MG tablet Take 1 tablet (25 mg total) by mouth 3 (three) times daily as needed for dizziness. 15 tablet 0  . meloxicam (MOBIC) 15 MG tablet Take 15 mg by mouth at bedtime.    . Multiple Vitamin (MULTIVITAMIN WITH MINERALS) TABS tablet Take 1 tablet by mouth daily.    [name] omeprazole (PRILOSEC) 40 MG capsule Take 40 mg by mouth 2 (two) times daily.    . ondansetron (ZOFRAN ODT) 4 MG disintegrating  tablet 4mg  ODT q4 hours prn nausea/vomit 10 tablet 0  . ranitidine (ZANTAC) 300 MG tablet Take 300 mg by mouth at bedtime.    . vitamin B-12 (CYANOCOBALAMIN) 1000 MCG tablet Take 1,000 mcg by mouth daily.     No current facility-administered medications on file prior to visit.     Allergies  Allergen Reactions  . Codeine Nausea Only  . Ibuprofen Other (See Comments)    Reaction:  Raises pts BP  . Penicillins Hives and Other (See Comments)    Has patient had a PCN reaction causing immediate rash, facial/tongue/throat swelling, SOB or lightheadedness with hypotension: No Has patient had a PCN reaction causing severe rash involving mucus membranes or skin necrosis: No Has patient had a PCN reaction that required hospitalization No Has patient had a PCN reaction occurring within the last 10 years: No If all of the above answers are "NO", then may proceed with Cephalosporin use.  [name] Percocet [Oxycodone-Acetaminophen] Nausea Only  . Tramadol Nausea Only  . Vicodin [Hydrocodone-Acetaminophen] Nausea Only    Family History  Problem Relation Age of Onset  . Breast cancer Sister 2  . Diabetes Sister   . Diabetes Mother     BP (!) 177/80 (BP Location: Right Wrist, Patient Position: Sitting, Cuff Size: Normal)   Pulse 62   Wt (!) 304 lb (137.9 kg)   SpO2 (!) 62%   BMI 46.22 kg/m    Review of Systems Denies LOC.  She requests to see orthopedic dr in [place], for chronic right knee pain.  Denies falls.      Objective:   Physical Exam VITAL SIGNS:  See vs page GENERAL: no distress Gait: steady, but she favors LLE.     A1c=7.3%  Lab Results  Component Value Date   CREATININE 1.69 (H) 02/03/2017   BUN 29 (H) 02/03/2017   NA 140 02/03/2017   K 3.8 02/03/2017   CL 105 02/03/2017   CO2 26 02/03/2017      Assessment & Plan:  Insulin-requiring type 2 DM: she needs increased rx Hypoglycemia: this limits aggressiveness of glycemic control Renal failure: she may not need  basal insulin  Patient Instructions  check your blood sugar twice a day.  vary the time of day when you check, between before the 3 meals, and at bedtime.  also check if you have symptoms of your blood sugar being too high or too low.  please keep a record of the readings and bring it to your next appointment here (or you can bring the  meter itself).  You can write it on any piece of paper.  please call us sooner if your blood sugar goes below 70, or if you have a lot of readings over 200. For now, please: Increase the novolog to 10 units 3 times a day (just before each meal, no matter what your blood sugar is), and: continue the same levemir to 10 units at bedtime.  Please see the 2 specialists we discussed.  you will receive a phone call, about days and times for appointments.  Please come back for a follow-up appointment in 2 months.

## 2017-10-03 DIAGNOSIS — E119 Type 2 diabetes mellitus without complications: Secondary | ICD-10-CM | POA: Insufficient documentation

## 2017-10-03 DIAGNOSIS — E1165 Type 2 diabetes mellitus with hyperglycemia: Secondary | ICD-10-CM | POA: Insufficient documentation

## 2017-10-03 DIAGNOSIS — IMO0002 Reserved for concepts with insufficient information to code with codable children: Secondary | ICD-10-CM | POA: Insufficient documentation

## 2017-10-03 DIAGNOSIS — E1142 Type 2 diabetes mellitus with diabetic polyneuropathy: Secondary | ICD-10-CM | POA: Insufficient documentation

## 2017-10-06 LAB — POCT GLYCOSYLATED HEMOGLOBIN (HGB A1C): Hemoglobin A1C: 7.3

## 2017-10-06 NOTE — Addendum Note (Signed)
Addended by: Dorna Leitz on: 10/06/2017 10:42 AM   Modules accepted: Orders

## 2017-10-07 DIAGNOSIS — R42 Dizziness and giddiness: Secondary | ICD-10-CM | POA: Diagnosis not present

## 2017-10-07 DIAGNOSIS — R600 Localized edema: Secondary | ICD-10-CM | POA: Diagnosis not present

## 2017-10-07 DIAGNOSIS — I509 Heart failure, unspecified: Secondary | ICD-10-CM | POA: Diagnosis not present

## 2017-10-09 ENCOUNTER — Ambulatory Visit: Payer: Medicare Other | Admitting: Sports Medicine

## 2017-10-16 ENCOUNTER — Ambulatory Visit: Payer: Medicare Other | Admitting: Sports Medicine

## 2017-10-17 ENCOUNTER — Encounter: Payer: Self-pay | Admitting: *Deleted

## 2017-10-20 ENCOUNTER — Encounter: Payer: Self-pay | Admitting: Endocrinology

## 2017-10-21 ENCOUNTER — Ambulatory Visit: Payer: Self-pay | Admitting: Cardiology

## 2017-10-21 ENCOUNTER — Other Ambulatory Visit: Payer: Self-pay

## 2017-10-21 MED ORDER — GLUCOSE BLOOD VI STRP: ORAL_STRIP | 12 refills | Status: DC

## 2017-10-30 DIAGNOSIS — S92255D Nondisplaced fracture of navicular [scaphoid] of left foot, subsequent encounter for fracture with routine healing: Secondary | ICD-10-CM | POA: Diagnosis not present

## 2017-10-30 DIAGNOSIS — M1460 Charcot's joint, unspecified site: Secondary | ICD-10-CM | POA: Diagnosis not present

## 2017-11-06 ENCOUNTER — Ambulatory Visit (INDEPENDENT_AMBULATORY_CARE_PROVIDER_SITE_OTHER): Payer: Medicare Other | Admitting: Sports Medicine

## 2017-11-06 ENCOUNTER — Encounter: Payer: Self-pay | Admitting: Sports Medicine

## 2017-11-06 VITALS — BP 175/61 | Ht 68.0 in | Wt 296.0 lb

## 2017-11-06 DIAGNOSIS — M1711 Unilateral primary osteoarthritis, right knee: Secondary | ICD-10-CM | POA: Diagnosis not present

## 2017-11-06 DIAGNOSIS — M25531 Pain in right wrist: Secondary | ICD-10-CM

## 2017-11-06 MED ORDER — METHYLPREDNISOLONE ACETATE 40 MG/ML IJ SUSP
40.0000 mg | Freq: Once | INTRAMUSCULAR | Status: AC
  Administered 2017-11-06: 40 mg via INTRA_ARTICULAR

## 2017-11-06 NOTE — Progress Notes (Signed)
   Subjective:    Patient ID: Rachel Holmes, female    DOB: [birth year], 57 y.o.   MRN: [id number]  HPI chief complaint: Right knee and right wrist pain  57 year old female comes in today with a couple of different complaints. Main complaint is right knee pain. She has a history of right knee DJD. She was most recently treated in [facility] She has recently moved to [place]. She has had cortisone injections as well as Visco supplementation. Visco supplementation was completed in June 2017. It only provided a few weeks of symptom relief. Cortisone injections have helped in the past. She denies any recent trauma. No swelling. Pain is primarily along the medial knee. She is being treated for a Charcot foot on the left which causes her to put a lot of pressure on her right leg. She denies any locking or catching. No numbness or tingling. No prior knee surgeries. She uses Voltaren gel which is minimally helpful. Meloxicam is very helpful but she has chronic kidney disease which limits her ability to take this medicine. She is also complaining of ulnar-sided right wrist pain. Pain has been present for couple of months. No trauma. No swelling. No numbness or tingling. No imaging.  Past medical history reviewed Medications reviewed Allergies reviewed    Review of Systems    as above Objective:   Physical Exam Well-developed, well-nourished. No acute distress. Awake alert and oriented 3. Vital signs reviewed.  Right wrist: Full range of motion. No effusion. No soft tissue swelling. She is tender to palpation along the ulnar aspect of the wrist along the course of the ECU tendon. Reproducible pain with resisted ulnar deviation. Negative Tinel's. Good grip strength. Good pulses. No atrophy.  Right knee: Range of motion 0-120. No effusion. 1+ boggy synovitis. Slight tenderness to palpation along the medial joint line but a negative McMurray's. Slight tenderness along the lateral joint line as  well. Knee is stable to ligamentous exam. Neurovascularly intact distally. Good strength. Walking with a slight limp.       Assessment & Plan:   Right wrist pain likely secondary to ECU tendinitis Right knee pain secondary to DJD  Right knee is injected with cortisone today. Patient tolerated this without difficulty. An anterior medial approach was utilized. Patient was cautioned about transient hyperglycemia. She was given a wrist wrap for her right wrist and she will wear it with activity. If pain persists either in her knee or her wrist I would start with getting x-rays. Follow-up as needed.  Consent obtained and verified. Time-out conducted. Noted no overlying erythema, induration, or other signs of local infection. Skin prepped in a sterile fashion. Topical analgesic spray: Ethyl chloride. Joint: right knee Needle: 22g 1.5 inch Completed without difficulty. Meds: 3cc 1% xylocaine, 1cc (40mg ) depomedrol  Advised to call if fevers/chills, erythema, induration, drainage, or persistent bleeding.

## 2017-11-11 ENCOUNTER — Telehealth: Payer: Self-pay | Admitting: Endocrinology

## 2017-11-11 NOTE — Telephone Encounter (Signed)
Please call patient at ph# [phone] re: test strips

## 2017-11-11 NOTE — Telephone Encounter (Signed)
I tried to return patient's call but she has a VM box that hasn't been set up, so I was unable to leave a message.

## 2017-11-14 DIAGNOSIS — M1460 Charcot's joint, unspecified site: Secondary | ICD-10-CM | POA: Diagnosis not present

## 2017-11-14 DIAGNOSIS — S92253D Displaced fracture of navicular [scaphoid] of unspecified foot, subsequent encounter for fracture with routine healing: Secondary | ICD-10-CM | POA: Diagnosis not present

## 2017-11-17 ENCOUNTER — Other Ambulatory Visit: Payer: Self-pay

## 2017-11-17 ENCOUNTER — Telehealth: Payer: Self-pay | Admitting: Endocrinology

## 2017-11-17 NOTE — Telephone Encounter (Signed)
Ok to increase to qid

## 2017-11-17 NOTE — Telephone Encounter (Signed)
Patient is wanting to know how many times to take her b/s, please advise

## 2017-11-17 NOTE — Telephone Encounter (Signed)
Patient wanted to know if she could have new prescription sent for test strips because she is constantly running out early. She would be testing 3-4 times daily instead of two. Ok to send in?

## 2017-11-18 ENCOUNTER — Other Ambulatory Visit: Payer: Self-pay

## 2017-11-18 MED ORDER — GLUCOSE BLOOD VI STRP: ORAL_STRIP | 12 refills | Status: DC

## 2017-11-18 NOTE — Telephone Encounter (Signed)
I have sent prescription to pharmacy.

## 2017-11-20 DIAGNOSIS — M1460 Charcot's joint, unspecified site: Secondary | ICD-10-CM | POA: Diagnosis not present

## 2017-12-02 ENCOUNTER — Other Ambulatory Visit: Payer: Self-pay

## 2017-12-05 ENCOUNTER — Ambulatory Visit (INDEPENDENT_AMBULATORY_CARE_PROVIDER_SITE_OTHER): Payer: Medicare Other | Admitting: Endocrinology

## 2017-12-05 VITALS — BP 172/94 | HR 76 | Ht 68.0 in | Wt 306.6 lb

## 2017-12-05 DIAGNOSIS — N183 Chronic kidney disease, stage 3 unspecified: Secondary | ICD-10-CM

## 2017-12-05 DIAGNOSIS — E1122 Type 2 diabetes mellitus with diabetic chronic kidney disease: Secondary | ICD-10-CM

## 2017-12-05 DIAGNOSIS — Z794 Long term (current) use of insulin: Secondary | ICD-10-CM | POA: Diagnosis not present

## 2017-12-05 LAB — POCT GLYCOSYLATED HEMOGLOBIN (HGB A1C): Hemoglobin A1C: 7.2

## 2017-12-05 MED ORDER — INSULIN ASPART 100 UNIT/ML FLEXPEN: PEN_INJECTOR | SUBCUTANEOUS | 11 refills | Status: DC

## 2017-12-05 NOTE — Patient Instructions (Addendum)
check your blood sugar twice a day.  vary the time of day when you check, between before the 3 meals, and at bedtime.  also check if you have symptoms of your blood sugar being too high or too low.  please keep a record of the readings and bring it to your next appointment here (or you can bring the meter itself).  You can write it on any piece of paper.  please call us sooner if your blood sugar goes below 70, or if you have a lot of readings over 200. For now, please: change the novolog to 3 times a day, 12-10-8 units, and: continue the same levemir: 10 units at bedtime.  Please come back for a follow-up appointment in 2 months.

## 2017-12-05 NOTE — Progress Notes (Signed)
Subjective:    Patient ID: Rachel Holmes, female    DOB: 05-Mar-1961, 57 y.o.   MRN: [id number]  HPI Pt returns for f/u of diabetes mellitus: DM type: Insulin-requiring type 2 Dx'ed: 1988, during a pregnancy, but it persisted after Complications: polyneuropathy, renal failure, leg ulcer, and PDR Therapy: insulin since soon after dx GDM: never DKA: never Severe hypoglycemia: 2 episodes (both many years ago). Pancreatitis: never Pancreatic imaging: normal on 2008 [name] Other: she takes multiple daily injections; she requested to d/c trulicity.  Interval history: she has mild hypoglycemia approx twice a month.  This happens at HS.  It is highest at lunch.   Past Medical History:  Diagnosis Date  . Allergic rhinitis   . Anemia   . Anxiety   . Bradycardia   . Breast mass    Patient can no longer palpate specific masses but showed tech general area of concern  . CHF (congestive heart failure) ([place])   . CKD (chronic kidney disease)    STAGE 3  . Diabetes mellitus without complication ([place])   . GERD (gastroesophageal reflux disease)   . Hyperparathyroidism ([place])   . Hypertension   . Legally blind in left eye, as defined in [place]   . Lymphedema   . Onychomycosis     Past Surgical History:  Procedure Laterality Date  . ABDOMINAL HYSTERECTOMY    . BREAST BIOPSY Left 2014   FNA 12:00 position - Negative  . EYE SURGERY      Social History   Socioeconomic History  . Marital status: Unknown    Spouse name: Not on file  . Number of children: Not on file  . Years of education: Not on file  . Highest education level: Not on file  Social Needs  . Financial resource strain: Not on file  . Food insecurity - worry: Not on file  . Food insecurity - inability: Not on file  . Transportation needs - medical: Not on file  . Transportation needs - non-medical: Not on file  Occupational History  . Not on file  Tobacco Use  . Smoking status: Never Smoker  Substance and Sexual Activity   . Alcohol use: No  . Drug use: Not on file  . Sexual activity: Not on file  Other Topics Concern  . Not on file  Social History Narrative   ** Merged History Encounter **        Current Outpatient Medications on File Prior to Visit  Medication Sig Dispense Refill  . albuterol (PROVENTIL HFA;VENTOLIN HFA) 108 (90 Base) MCG/ACT inhaler Inhale 1-2 puffs into the lungs every 6 (six) hours as needed for wheezing or shortness of breath.    [name] amLODipine (NORVASC) 10 MG tablet Take 10 mg by mouth daily.    [name] aspirin EC 325 MG tablet Take 325 mg by mouth daily.    . cloNIDine (CATAPRES) 0.3 MG tablet Take 0.3 mg by mouth 2 (two) times daily.    . cyclobenzaprine (FLEXERIL) 10 MG tablet Take 1 tablet (10 mg total) by mouth 3 (three) times daily as needed for muscle spasms. 30 tablet 0  . diclofenac sodium (VOLTAREN) 1 % GEL Apply 2 g topically 4 (four) times daily as needed (for pain).    [name] ezetimibe (ZETIA) 10 MG tablet Take 10 mg by mouth at bedtime.    . ferrous sulfate 325 (65 FE) MG tablet Take 325 mg by mouth 3 (three) times daily with meals.    . fluticasone (  FLONASE) 50 MCG/ACT nasal spray Place 1-2 sprays into both nostrils daily as needed for rhinitis.    . furosemide (LASIX) 20 MG tablet Take 20 mg by mouth daily.    [name] gabapentin (NEURONTIN) 600 MG tablet Take 600 mg by mouth at bedtime.    [name] glucose blood (ONE TOUCH ULTRA TEST) test strip Use to test blood sugar three daily DX-E11.9 150 each 12  . hydrOXYzine (ATARAX/VISTARIL) 25 MG tablet Take 25 mg by mouth 3 (three) times daily as needed for itching.    . Insulin Detemir (LEVEMIR FLEXTOUCH) 100 UNIT/ML Pen Inject 10 Units into the skin at bedtime. 15 mL 11  . isosorbide mononitrate (IMDUR) 120 MG 24 hr tablet Take 120 mg by mouth daily.    . meclizine (ANTIVERT) 25 MG tablet Take 1 tablet (25 mg total) by mouth 3 (three) times daily as needed for dizziness. 15 tablet 0  . meloxicam (MOBIC) 15 MG tablet Take 15 mg by mouth at  bedtime.    . Multiple Vitamin (MULTIVITAMIN WITH MINERALS) TABS tablet Take 1 tablet by mouth daily.    [name] omeprazole (PRILOSEC) 40 MG capsule Take 40 mg by mouth 2 (two) times daily.    . ondansetron (ZOFRAN ODT) 4 MG disintegrating tablet 4mg  ODT q4 hours prn nausea/vomit 10 tablet 0  . ranitidine (ZANTAC) 300 MG tablet Take 300 mg by mouth at bedtime.    . vitamin B-12 (CYANOCOBALAMIN) 1000 MCG tablet Take 1,000 mcg by mouth daily.     No current facility-administered medications on file prior to visit.     Allergies  Allergen Reactions  . Codeine Nausea Only  . Ibuprofen Other (See Comments)    Reaction:  Raises pts BP  . Penicillins Hives and Other (See Comments)    Has patient had a PCN reaction causing immediate rash, facial/tongue/throat swelling, SOB or lightheadedness with hypotension: No Has patient had a PCN reaction causing severe rash involving mucus membranes or skin necrosis: No Has patient had a PCN reaction that required hospitalization No Has patient had a PCN reaction occurring within the last 10 years: No If all of the above answers are "NO", then may proceed with Cephalosporin use.  [name] Percocet [Oxycodone-Acetaminophen] Nausea Only  . Tramadol Nausea Only  . Vicodin [Hydrocodone-Acetaminophen] Nausea Only    Family History  Problem Relation Age of Onset  . Breast cancer Sister 54  . Diabetes Sister   . Diabetes Mother     BP (!) 172/94 (BP Location: Right Arm, Patient Position: Sitting, Cuff Size: Large)   Pulse 76   Ht 5\' 8"  (1.727 m)   Wt (!) 306 lb 9.6 oz (139.1 kg)   SpO2 98%   BMI 46.62 kg/m    Review of Systems Denies LOC    Objective:   Physical Exam VITAL SIGNS:  See vs page GENERAL: no distress Pulses: foot pulses are intact bilaterally.   MSK: chronic deformity left foot/ankle is noted.  CV: 2+ bilat edema of the legs. Skin:  no ulcer on the feet or ankles.  normal color and temp on the feet and ankles Neuro: sensation is intact to  touch on the feet and ankles.      Lab Results  Component Value Date   HGBA1C 7.2 12/05/2017       Assessment & Plan:  Insulin-requiring type 2 DM. Renal failure: this the likely reason why she needs less basal insulin than mealtime. Hypoglycemia: based on the pattern of her cbg's, she needs  some adjustment in her therapy   Patient Instructions  check your blood sugar twice a day.  vary the time of day when you check, between before the 3 meals, and at bedtime.  also check if you have symptoms of your blood sugar being too high or too low.  please keep a record of the readings and bring it to your next appointment here (or you can bring the meter itself).  You can write it on any piece of paper.  please call us sooner if your blood sugar goes below 70, or if you have a lot of readings over 200. For now, please: change the novolog to 3 times a day, 12-10-8 units, and: continue the same levemir: 10 units at bedtime.  Please come back for a follow-up appointment in 2 months.

## 2017-12-12 DIAGNOSIS — I5032 Chronic diastolic (congestive) heart failure: Secondary | ICD-10-CM | POA: Diagnosis not present

## 2017-12-12 DIAGNOSIS — Z794 Long term (current) use of insulin: Secondary | ICD-10-CM | POA: Diagnosis not present

## 2017-12-12 DIAGNOSIS — M14672 Charcot's joint, left ankle and foot: Secondary | ICD-10-CM | POA: Diagnosis not present

## 2017-12-12 DIAGNOSIS — E11610 Type 2 diabetes mellitus with diabetic neuropathic arthropathy: Secondary | ICD-10-CM | POA: Diagnosis not present

## 2017-12-12 DIAGNOSIS — E782 Mixed hyperlipidemia: Secondary | ICD-10-CM | POA: Diagnosis not present

## 2017-12-12 DIAGNOSIS — N2581 Secondary hyperparathyroidism of renal origin: Secondary | ICD-10-CM | POA: Diagnosis not present

## 2017-12-12 DIAGNOSIS — E213 Hyperparathyroidism, unspecified: Secondary | ICD-10-CM | POA: Diagnosis not present

## 2017-12-12 DIAGNOSIS — E1122 Type 2 diabetes mellitus with diabetic chronic kidney disease: Secondary | ICD-10-CM | POA: Diagnosis not present

## 2017-12-12 DIAGNOSIS — N183 Chronic kidney disease, stage 3 (moderate): Secondary | ICD-10-CM | POA: Diagnosis not present

## 2017-12-22 ENCOUNTER — Ambulatory Visit: Payer: Medicare Other | Admitting: Podiatry

## 2017-12-23 ENCOUNTER — Ambulatory Visit: Payer: Medicare Other | Admitting: Podiatry

## 2017-12-30 ENCOUNTER — Ambulatory Visit: Payer: Medicare Other | Admitting: Sports Medicine

## 2017-12-31 ENCOUNTER — Ambulatory Visit
Admission: RE | Admit: 2017-12-31 | Discharge: 2017-12-31 | Disposition: A | Discharge status: Home / Self Care | Payer: Medicare Other | Source: Ambulatory Visit | Attending: Family Medicine | Admitting: Family Medicine

## 2017-12-31 ENCOUNTER — Telehealth: Payer: Self-pay | Admitting: Family Medicine

## 2017-12-31 ENCOUNTER — Ambulatory Visit (INDEPENDENT_AMBULATORY_CARE_PROVIDER_SITE_OTHER): Payer: Medicare Other | Admitting: Family Medicine

## 2017-12-31 ENCOUNTER — Encounter: Payer: Self-pay | Admitting: Family Medicine

## 2017-12-31 VITALS — BP 183/73 | HR 53 | Ht 68.0 in | Wt 296.0 lb

## 2017-12-31 DIAGNOSIS — M1711 Unilateral primary osteoarthritis, right knee: Secondary | ICD-10-CM

## 2017-12-31 DIAGNOSIS — M25561 Pain in right knee: Secondary | ICD-10-CM | POA: Diagnosis not present

## 2017-12-31 NOTE — Telephone Encounter (Signed)
Called and spoke with Rachel Holmes this afternoon in regards to her XR of her right knee. Unfortunately she does have severe, tricompartmental OA with bone-on-bone changes seen in the medial compartment of the right knee. I discussed I will have her referred to [facility] for viscosupplementation. Ultimately, she will need a knee replacement at some point in the future, however, her BMI is at 45 and currently precludes her from being a safe candidate for this. I discussed weight loss and offered referral to bariatric physician. She will think about the bariatric referral, but is agreeable to referral to [place].  Mort Sawyers, MD Primary Care Sports Medicine Fellow [facility]

## 2017-12-31 NOTE — Progress Notes (Signed)
Chief complaint: Right knee pain x 1 month, had another fall  History of present illness: Rachel Holmes is a 57 year old female who presents to sports medicine office today with chief complaint of right knee pain.  She reports that symptoms have been present for approximately 1 month.  She reports that she had another fall about 4 weeks ago.  She reports that she was walking around her house during the night, reports that the lights were off.  She reports that she stumbled on her feet and fell forward on her knees.  Since that time, she reports having pain in the infrapatellar aspect of her right knee, as well as on both the medial and lateral joint line.  She reports of painful popping, but no locking, catching, or symptoms of giving way.  She does have known history of right knee DJD.  She was seen here by Dr. Micheline Chapman on 11/06/17 for similar history.  She did have a fall before her evaluation with Dr. Micheline Chapman for knee pain.  She did have a cortisone injection to her right knee at that appointment.  She reports that she had improvement in symptoms for a week, but then had the fall a week later.  She does not report of any warmth, erythema, ecchymosis, or effusion.  She does not report of any fevers, chills, night sweats, or any unintentional weight loss.  She does not report of any numbness, tingling, or burning paresthesias.  She reports flexing her knee, going up, going down stairs, as well as prolonged standing are aggravating factors.  She is using meloxicam 15 mg daily, is also using Voltaren topical gel, both of which are not providing much in the way of relief of symptoms.  She has had history of getting Visco supplementation for her right knee, which she reports has helped.  She reports that this was done in [place].  She reports that occasionally pain wakes her up from sleep at nighttime.  She has not tried any formal physical therapy.  Review of systems:  As stated above   Interval past medical history,  surgical history, family history, and social history obtained and unchanged.  Her past medical history is notable for hypertension, type 2 diabetes, CHF, CKD, obesity, GERD, and hyperparathyroidism, surgical history notable for left breast biopsy, eye surgery, and abdominal hysterectomy, does not report of any current tobacco use, family history notable for breast cancer and type 2 diabetes.  Allergies and medications reviewed, are reflected in EMR.  Physical exam: Vital signs are reviewed and are documented in the chart Gen.: Alert, oriented, appears stated age, in no apparent distress, is obese HEENT: Moist oral mucosa Respiratory: Normal respirations, able to speak in full sentences Cardiac: Regular rate, distal pulses 2+, bilateral 1+ pitting edema up one third of the lower leg Integumentary: No rashes on visible skin:  Neurologic: Strength 5/5 both knee flexion and knee extension, sensation 2+ bilateral lower extremities Psych: Normal affect, mood is described as good Musculoskeletal: Inspection of right knee reveals no obvious deformity or muscle atrophy, no warmth, erythema, ecchymosis, or notable effusion noted, due to body habitus unable to appreciate any notable effusion, she is tender to palpation along both the medial and lateral joint line of the right knee, no tenderness of the quadriceps tendon or patellar tendon, no signs of ligamentous instability as Lachman, anterior drawer, valgus, varus stress testing negative, McMurray positive for pain, negative for crepitus, range of motion today is from 0 degrees to 110 degrees, pain limits  her from going further, has a slight antalgic gait favoring the right side  Assessment and plan: 1.  Acute on chronic right knee pain, suspect aggravation of pre-existing osteoarthritis 2.  Morbid obesity 3.  Hypertension, with elevated blood pressure here in the office today 4.  History of type 2 diabetes  Plan: Ultimately, discussed with Rachel Holmes today  that I suspect that her symptoms are consistent with aggravation of pre-existing osteoarthritis of her right knee.  What is concerning is that this is her second fall in the last 3 months.  She does not report of any balance issues or coordination difficulties. I do not see any evidence on exam today to be concerned for cerebellar dysfunction. I did discuss option of  formal physical therapy for gait training and mobility to minimize her risk for falling.  She does decline on this today.  Discussed if she changes her mind to call the office and we will have this set up for her.  Discussed it is too soon to repeat a cortisone injection.  I do feel that x-rays are needed today.  Will order for 4 views of her right knee to include AP, lateral, sunrise, and Rosenberg.  Will call her after x-ray results.  I discussed option of repeat Visco supplementation to be done by one of our local orthopedic offices if she is interested in doing this.  She reports that she will think about it.  In regards to pain medication, discussed due to her history of hypertension and blood pressure being elevated today holding off on meloxicam.  Discussed that she can continue taking topical Voltaren gel.  I also discussed her using capsaicin cream and Aspercreme.  Will plan to have her follow-up on as-needed basis otherwise.   Mort Sawyers, M.D. [facility]

## 2018-01-02 ENCOUNTER — Other Ambulatory Visit: Payer: Self-pay

## 2018-01-02 NOTE — Progress Notes (Signed)
Referred pt to Dr. Tamala Julian for visco supplementation injections of her right knee. His office will call her to schedule appt. Pt is aware.

## 2018-01-05 NOTE — Progress Notes (Addendum)
[facility] [address] [place], [place] [address] Phone: [phone] Subjective:      CC: Knee pain  ZCH:[id number]  Rachel Holmes is a 57 y.o. female coming in with complaint of knee pain.  Seems to be mostly right-sided.  Been going on for quite some time but seem to be worsening over the last 4-6 weeks.  Reports that he may benefit follow-up.  Having pain mostly in the anterior aspect of the knee.  Patient was having increasing instability.  Positive swelling.  Has failed all conservative therapy including steroid injections and formal physical therapy.  Patient has been approved for Monovisc.  Patient is here now for further evaluation as well.   Patient did have right knee x-rays done on December 31, 2017.  Found to have severe nearly bone-on-bone Oster arthritic changes mostly of the medial compartment but tricompartmental.  Moderate joint effusion.  Past Medical History:  Diagnosis Date  . Allergic rhinitis   . Anemia   . Anxiety   . Bradycardia   . Breast mass    Patient can no longer palpate specific masses but showed tech general area of concern  . CHF (congestive heart failure) ([place])   . CKD (chronic kidney disease)    STAGE 3  . Diabetes mellitus without complication ([place])   . GERD (gastroesophageal reflux disease)   . Hyperparathyroidism ([place])   . Hypertension   . Legally blind in left eye, as defined in [place]   . Lymphedema   . Onychomycosis    Past Surgical History:  Procedure Laterality Date  . ABDOMINAL HYSTERECTOMY    . BREAST BIOPSY Left 2014   FNA 12:00 position - Negative  . EYE SURGERY     Social History   Socioeconomic History  . Marital status: Unknown    Spouse name: Not on file  . Number of children: Not on file  . Years of education: Not on file  . Highest education level: Not on file  Occupational History  . Not on file  Social Needs  . Financial resource strain: Not on file  . Food insecurity:    Worry:  Not on file    Inability: Not on file  . Transportation needs:    Medical: Not on file    Non-medical: Not on file  Tobacco Use  . Smoking status: Never Smoker  . Smokeless tobacco: Never Used  Substance and Sexual Activity  . Alcohol use: No  . Drug use: Never  . Sexual activity: Not on file  Lifestyle  . Physical activity:    Days per week: Not on file    Minutes per session: Not on file  . Stress: Not on file  Relationships  . Social connections:    Talks on phone: Not on file    Gets together: Not on file    Attends religious service: Not on file    Active member of club or organization: Not on file    Attends meetings of clubs or organizations: Not on file    Relationship status: Not on file  Other Topics Concern  . Not on file  Social History Narrative   ** Merged History Encounter **       Allergies  Allergen Reactions  . Codeine Nausea Only  . Ibuprofen Other (See Comments)    Reaction:  Raises pts BP  . Penicillins Hives and Other (See Comments)    Has patient had a PCN reaction causing immediate  rash, facial/tongue/throat swelling, SOB or lightheadedness with hypotension: No Has patient had a PCN reaction causing severe rash involving mucus membranes or skin necrosis: No Has patient had a PCN reaction that required hospitalization No Has patient had a PCN reaction occurring within the last 10 years: No If all of the above answers are "NO", then may proceed with Cephalosporin use.  [name] Percocet [Oxycodone-Acetaminophen] Nausea Only  . Tramadol Nausea Only  . Vicodin [Hydrocodone-Acetaminophen] Nausea Only   Family History  Problem Relation Age of Onset  . Breast cancer Sister 72  . Diabetes Sister   . Diabetes Mother      Past medical history, social, surgical and family history all reviewed in electronic medical record.  No pertanent information unless stated regarding to the chief complaint.   Review of Systems:Review of systems updated and as accurate   No headache, visual changes, nausea, vomiting, diarrhea, constipation, dizziness, abdominal pain, skin rash, fevers, chills, night sweats, weight loss, swollen lymph nodes, body aches, joint swelling, muscle aches, chest pain, shortness of breath, mood changes.   Objective  Blood pressure 130/78, pulse (!) 56, height 5\' 8"  (1.727 m), weight (!) 309 lb (140.2 kg), SpO2 97 %.   General: No apparent distress alert and oriented x3 mood and affect normal, dressed appropriately.  HEENT: Pupils equal, extraocular movements intact  Respiratory: Patient's speak in full sentences and does not appear short of breath  Cardiovascular: No lower extremity edema, non tender, no erythema  Skin: Warm dry intact with no signs of infection or rash on extremities or on axial skeleton.  Abdomen: Soft nontender  Neuro: Cranial nerves II through XII are intact, neurovascularly intact in all extremities with 2+ DTRs and 2+ pulses.  Lymph: No lymphadenopathy of posterior or anterior cervical chain or axillae bilaterally.  Gait antalgic gait.  MSK:  Non tender with full range of motion and good stability and symmetric strength and tone of shoulders, elbows, wrist, hip, and ankles bilaterally.  Knee: Right valgus deformity noted. Large thigh to calf ratio.  Tender to palpation over medial and PF joint line.  ROM full in flexion and extension and lower leg rotation. instability with valgus force.  painful patellar compression. Patellar glide with moderate crepitus. Patellar and quadriceps tendons unremarkable. Hamstring and quadriceps strength is normal. Contralateral knee shows moderate arthritic changes as well.  After informed written and verbal consent, patient was seated on exam table. Right knee was prepped with alcohol swab and utilizing anterolateral approach, patient's right knee space was injected with 22 mg/1 mL of monovisc (sodium hyaluronate) in a prefilled syringe was injected easily into the knee  through a 22-gauge needle..Patient tolerated the procedure well without immediate complications.    Impression and Recommendations:     This case required medical decision making of moderate complexity.      Note: This dictation was prepared with Dragon dictation along with smaller phrase technology. Any transcriptional errors that result from this process are unintentional.

## 2018-01-06 ENCOUNTER — Ambulatory Visit (INDEPENDENT_AMBULATORY_CARE_PROVIDER_SITE_OTHER): Payer: Medicare Other | Admitting: Family Medicine

## 2018-01-06 ENCOUNTER — Encounter: Payer: Self-pay | Admitting: Family Medicine

## 2018-01-06 DIAGNOSIS — M1711 Unilateral primary osteoarthritis, right knee: Secondary | ICD-10-CM

## 2018-01-06 NOTE — Assessment & Plan Note (Signed)
Moderate arthritic changes.  Discussed icing regimen and home exercises.  Discussed topical anti-inflammatories.  Encourage weight loss.  Can repeat every 6 months if needed.  Follow-up again in 4 weeks

## 2018-01-06 NOTE — Patient Instructions (Signed)
Good to see you  Ice 20 minutes 2 times daily. Usually after activity and before bed. pennsaid pinkie amount topically 2 times daily as needed.  We tried monovisc.  See me again in 4 weeks

## 2018-01-09 ENCOUNTER — Encounter: Payer: Self-pay | Admitting: Podiatry

## 2018-01-09 ENCOUNTER — Ambulatory Visit (INDEPENDENT_AMBULATORY_CARE_PROVIDER_SITE_OTHER): Payer: Medicare Other | Admitting: Podiatry

## 2018-01-09 DIAGNOSIS — M2142 Flat foot [pes planus] (acquired), left foot: Secondary | ICD-10-CM

## 2018-01-09 DIAGNOSIS — M79675 Pain in left toe(s): Secondary | ICD-10-CM | POA: Diagnosis not present

## 2018-01-09 DIAGNOSIS — E1142 Type 2 diabetes mellitus with diabetic polyneuropathy: Secondary | ICD-10-CM

## 2018-01-09 DIAGNOSIS — M1460 Charcot's joint, unspecified site: Secondary | ICD-10-CM | POA: Diagnosis not present

## 2018-01-09 DIAGNOSIS — M79674 Pain in right toe(s): Secondary | ICD-10-CM | POA: Diagnosis not present

## 2018-01-09 DIAGNOSIS — B351 Tinea unguium: Secondary | ICD-10-CM | POA: Diagnosis not present

## 2018-01-09 DIAGNOSIS — M2141 Flat foot [pes planus] (acquired), right foot: Secondary | ICD-10-CM

## 2018-01-09 NOTE — Progress Notes (Signed)
Complaint:  Visit Type: Patient returns to my office for continued preventative foot care services. Complaint: Patient states" my nails have grown long and thick and become painful to walk and wear shoes" Patient has been diagnosed with DM with no foot complications. The patient presents for preventative foot care services. No changes to ROS  Podiatric Exam: Vascular: dorsalis pedis  are palpable bilateral. Posterior tibial pulses are absent  B/L due to swelling. Capillary return is immediate. Temperature gradient is WNL. Skin turgor WNL  Sensorium: Normal Semmes Weinstein monofilament test. Normal tactile sensation bilaterally. Nail Exam: Pt has thick disfigured discolored nails with subungual debris noted bilateral entire nail hallux through fifth toenails Ulcer Exam: There is no evidence of ulcer or pre-ulcerative changes or infection. Orthopedic Exam: Muscle tone and strength are WNL. No limitations in general ROM. No crepitus or effusions noted. Foot type and digits show no abnormalities. Bony prominences are unremarkable. Skin: No Porokeratosis. No infection or ulcers  Diagnosis:  Onychomycosis, , Pain in right toe, pain in left toes  Treatment & Plan Procedures and Treatment: Consent by patient was obtained for treatment procedures.   Debridement of mycotic and hypertrophic toenails, 1 through 5 bilateral and clearing of subungual debris. No ulceration, no infection noted.  Return Visit-Office Procedure: Patient instructed to return to the office for a follow up visit 3 months for continued evaluation and treatment.    Gardiner Barefoot DPM

## 2018-01-12 ENCOUNTER — Other Ambulatory Visit: Payer: Self-pay

## 2018-01-12 ENCOUNTER — Telehealth: Payer: Self-pay | Admitting: Endocrinology

## 2018-01-12 ENCOUNTER — Telehealth: Payer: Self-pay | Admitting: Family Medicine

## 2018-01-12 ENCOUNTER — Telehealth: Payer: Self-pay

## 2018-01-12 MED ORDER — GLUCOSE BLOOD VI STRP: ORAL_STRIP | 12 refills | Status: DC

## 2018-01-12 MED ORDER — GLUCOSE BLOOD VI STRP: ORAL_STRIP | 5 refills | Status: DC

## 2018-01-12 NOTE — Telephone Encounter (Signed)
Copied from [place] [id number]. Topic: Quick Communication - See Telephone Encounter >> Jan 12, 2018  2:25 PM Ivar Drape wrote: CRM for notification. See Telephone encounter for:  01/12/18. Patient was in on 01/06/18 to see Dr. Tamala Julian and was given a sample of Pensaid. She stated it worked ok, and would like a prescription for it.

## 2018-01-12 NOTE — Telephone Encounter (Signed)
This has been resolved

## 2018-01-12 NOTE — Telephone Encounter (Signed)
Patient is needing a new meter, she states that she can not afford the test strips that come with the One touch Ultra.    Please advise  [facility] [address], [place] - [address]

## 2018-01-12 NOTE — Telephone Encounter (Signed)
I LVM stating that patient could come by the office to get an accu-chek guide & I could send a prescription in for test strips. I asked that she call back if she wanted me to put a meter up the front desk for her.

## 2018-01-12 NOTE — Telephone Encounter (Signed)
Pt made aware insurance does not cover pennsaid. Samples left at front desk for pt to pickup.

## 2018-01-13 ENCOUNTER — Telehealth: Payer: Self-pay | Admitting: Endocrinology

## 2018-01-13 ENCOUNTER — Other Ambulatory Visit: Payer: Self-pay

## 2018-01-13 MED ORDER — GLUCOSE BLOOD VI STRP: ORAL_STRIP | 5 refills | Status: DC

## 2018-01-13 NOTE — Telephone Encounter (Signed)
I have fixed DX code &resent.

## 2018-01-13 NOTE — Telephone Encounter (Signed)
Pharmacy told patient that the wrong diagnosis number is on script for Test Strips-Pharmacy is [facility] on [address].Pharmacy told pt they sent a fax to get right diagnosis number for script-patient needs her test strips asap

## 2018-01-15 ENCOUNTER — Encounter (INDEPENDENT_AMBULATORY_CARE_PROVIDER_SITE_OTHER): Payer: Medicare Other | Admitting: Ophthalmology

## 2018-01-21 NOTE — Progress Notes (Signed)
[facility] Note  01/22/2018     CHIEF COMPLAINT Patient presents for Diabetic Retinal eval   HISTORY OF PRESENT ILLNESS: Rachel Holmes is a 57 y.o. female who presents to the clinic today for:   HPI    Patient referred by Dr. Celestia Khat for diabetic retinal evaluation. CBG was 73 this am. A1C was 7.1 2 months ago. Patient lost vision in OS in 2007 due to diabetes.  Patient states vision is good OD as long as she is wearing her glasses. Denies any recent changes in vision.    Last edited by Bernarda Caffey, MD on 01/22/2018 11:21 AM. (History)    Pt states she used to see Dr. Jeni Salles; Pt states she used to go to [facility], states "they did my retinopathy surgery"; Pt states he "took the lens out of my left eye and put a lens in my right eye" in 2009 or 2010; Pt states diabetes is stable, states last A1C was 7.1  Referring physician: Celestia Khat, [place], [place] [address]  HISTORICAL INFORMATION:   Selected notes from the MEDICAL RECORD NUMBER Referred by Dr. Hinton Dyer for DM exam LEE- 03.05.19 ([facility]) [BCVA OD: 20/30 OS: 20/40] Ocular Hx- aphakic OS, cataract OD PMH- DM (last A1C - 7.2), CKD, anxiety, anemia, bradycardia, CHF, HTN, elevated cholesterol, colitis, reflux, hyperparathyroidism    CURRENT MEDICATIONS: No current outpatient medications on file. (Ophthalmic Drugs)   No current facility-administered medications for this visit.  (Ophthalmic Drugs)   Current Outpatient Medications (Other)  Medication Sig  . albuterol (PROVENTIL HFA;VENTOLIN HFA) 108 (90 Base) MCG/ACT inhaler Inhale 1-2 puffs into the lungs every 6 (six) hours as needed for wheezing or shortness of breath.  [name] amLODipine (NORVASC) 10 MG tablet Take 10 mg by mouth daily.  [name] aspirin EC 325 MG tablet Take 325 mg by mouth daily.  . cloNIDine (CATAPRES) 0.3 MG tablet Take 0.3 mg by mouth 2 (two) times daily.  . cyclobenzaprine (FLEXERIL) 10  MG tablet Take 1 tablet (10 mg total) by mouth 3 (three) times daily as needed for muscle spasms.  . diclofenac sodium (VOLTAREN) 1 % GEL Apply 2 g topically 4 (four) times daily as needed (for pain).  [name] ezetimibe (ZETIA) 10 MG tablet Take 10 mg by mouth at bedtime.  . ferrous sulfate 325 (65 FE) MG tablet Take 325 mg by mouth 3 (three) times daily with meals.  . fluticasone (FLONASE) 50 MCG/ACT nasal spray Place 1-2 sprays into both nostrils daily as needed for rhinitis.  . furosemide (LASIX) 20 MG tablet Take 20 mg by mouth daily.  [name] gabapentin (NEURONTIN) 600 MG tablet Take 600 mg by mouth at bedtime.  [name] glucose blood (ACCU-CHEK GUIDE) test strip Used to check blood sugars 3x daily. Dx code E11.9  . hydrOXYzine (ATARAX/VISTARIL) 25 MG tablet Take 25 mg by mouth 3 (three) times daily as needed for itching.  . insulin aspart (NOVOLOG FLEXPEN) 100 UNIT/ML FlexPen 3 times a day (just before each meal), 12-10-8 units, and pen needles 4/day  . Insulin Detemir (LEVEMIR FLEXTOUCH) 100 UNIT/ML Pen Inject 10 Units into the skin at bedtime.  . isosorbide mononitrate (IMDUR) 120 MG 24 hr tablet Take 120 mg by mouth daily.  . meclizine (ANTIVERT) 25 MG tablet Take 1 tablet (25 mg total) by mouth 3 (three) times daily as needed for dizziness.  . meloxicam (MOBIC) 15 MG tablet Take 15 mg by mouth at bedtime.  [name]  Multiple Vitamin (MULTIVITAMIN WITH MINERALS) TABS tablet Take 1 tablet by mouth daily.  [name] omeprazole (PRILOSEC) 40 MG capsule Take 40 mg by mouth 2 (two) times daily.  . ondansetron (ZOFRAN ODT) 4 MG disintegrating tablet 4mg  ODT q4 hours prn nausea/vomit  . ranitidine (ZANTAC) 300 MG tablet Take 300 mg by mouth at bedtime.  . vitamin B-12 (CYANOCOBALAMIN) 1000 MCG tablet Take 1,000 mcg by mouth daily.   No current facility-administered medications for this visit.  (Other)      REVIEW OF SYSTEMS: ROS    Positive for: Gastrointestinal, Genitourinary, Musculoskeletal, HENT, Endocrine, Eyes    Negative for: Constitutional, Neurological, Skin, Cardiovascular, Respiratory, Psychiatric, Allergic/Imm, Heme/Lymph   Last edited by Roselee Nova D on 01/22/2018  9:10 AM. (History)       ALLERGIES Allergies  Allergen Reactions  . Codeine Nausea Only  . Ibuprofen Other (See Comments)    Reaction:  Raises pts BP  . Penicillins Hives and Other (See Comments)    Has patient had a PCN reaction causing immediate rash, facial/tongue/throat swelling, SOB or lightheadedness with hypotension: No Has patient had a PCN reaction causing severe rash involving mucus membranes or skin necrosis: No Has patient had a PCN reaction that required hospitalization No Has patient had a PCN reaction occurring within the last 10 years: No If all of the above answers are "NO", then may proceed with Cephalosporin use.  [name] Percocet [Oxycodone-Acetaminophen] Nausea Only  . Tramadol Nausea Only  . Vicodin [Hydrocodone-Acetaminophen] Nausea Only    PAST MEDICAL HISTORY Past Medical History:  Diagnosis Date  . Allergic rhinitis   . Anemia   . Anxiety   . Bradycardia   . Breast mass    Patient can no longer palpate specific masses but showed tech general area of concern  . CHF (congestive heart failure) ([place])   . CKD (chronic kidney disease)    STAGE 3  . Diabetes mellitus without complication ([place])   . GERD (gastroesophageal reflux disease)   . Hyperparathyroidism ([place])   . Hypertension   . Legally blind in left eye, as defined in [place]   . Lymphedema   . Onychomycosis    Past Surgical History:  Procedure Laterality Date  . ABDOMINAL HYSTERECTOMY    . BREAST BIOPSY Left 2014   FNA 12:00 position - Negative  . EYE SURGERY      FAMILY HISTORY Family History  Problem Relation Age of Onset  . Breast cancer Sister 52  . Diabetes Sister   . Diabetes Mother     SOCIAL HISTORY Social History   Tobacco Use  . Smoking status: Never Smoker  . Smokeless tobacco: Never Used  Substance Use Topics   . Alcohol use: No  . Drug use: Never         OPHTHALMIC EXAM:  Base Eye Exam    Visual Acuity (Snellen - Linear)      Right Left   Dist cc 20/30 HM   Dist ph cc NI NI   Correction:  Glasses       Tonometry (Tonopen, 9:30 AM)      Right Left   Pressure 13 13       Pupils      Dark Light Shape React APD   Right 5 4 Round Slow None   Left 3 3 Round Minimal +1       Visual Fields (Counting fingers)      Left Right   Restrictions Total superior temporal, inferior temporal, superior  nasal, inferior nasal deficiencies Partial outer superior temporal deficiency  Patient can see parts of hand OS but cannot tell how many fingers are up.       Extraocular Movement      Right Left    Full, Ortho Full, Ortho       Neuro/Psych    Oriented x3:  Yes   Mood/Affect:  Normal       Dilation    Both eyes:  1.0% Mydriacyl, 2.5% Phenylephrine @ 9:30 AM        Slit Lamp and Fundus Exam    Slit Lamp Exam      Right Left   Lids/Lashes Dermatochalasis - upper lid Dermatochalasis - upper lid   Conjunctiva/Sclera Mild Melanosis Mild Melanosis   Cornea Mild Arcus, Inferior 1+ Punctate epithelial erosions Arcus   Anterior Chamber Deep and quiet Deep and quiet   Iris Round and dilated, No NVI Round and moderately dilated to 74mm, No NVI   Lens 3+ Cortical cataract, 2-3+ Nuclear sclerosis aphakic, capsular phimosis   Vitreous Vitreous syneresis Vitreous syneresis       Fundus Exam      Right Left   Disc 1+ Pallor 3+ Pallor   C/D Ratio 0.7 0.8   Macula Blunted foveal reflex, Epiretinal membrane, Microaneurysms, pigment clumping, Retinal pigment epithelial mottling,  pigmented scarring and islands of pre-retinal fibrosis, pre-retinal hemorrhages   Vessels Severe Vascular attenuation, Copper wiring sclerotic   Periphery Attached, pre-retinal fibrosis superior to arcades, 360 PRP scars, scattered DBH Attached, 360 PRP, circumferential band of fibrosis IN to disc        Refraction     Wearing Rx      Sphere Cylinder Axis Add   Right -1.50 +0.75 023 +2.50   Left -0.50   +2.50   Type:  Bifocal       Manifest Refraction (Over)      Sphere Cylinder Axis Dist VA   Right -1.00 +0.50 008 20/30   Left +11.25   20/200-3          IMAGING AND PROCEDURES  Imaging and Procedures for 01/23/18  OCT, Retina - OU - Both Eyes       Right Eye Quality was good. Central Foveal Thickness: 222. Progression has no prior data. Findings include abnormal foveal contour, epiretinal membrane, no IRF, no SRF, inner retinal atrophy, outer retinal atrophy, macular pucker.   Left Eye Quality was good. Central Foveal Thickness: 295. Progression has no prior data. Findings include abnormal foveal contour, epiretinal membrane, macular pucker, outer retinal atrophy, no IRF, no SRF, pigment epithelial detachment, subretinal hyper-reflective material.   Notes *Images captured and stored on drive  Diagnosis / Impression:  OD: ERM with pucker, diffuse retinal atrophy, No DME OS: significant ORA with overlying diffuse atrophy and irregular inner retinal surface  Clinical management:  See below  Abbreviations: NFP - Normal foveal profile. CME - cystoid macular edema. PED - pigment epithelial detachment. IRF - intraretinal fluid. SRF - subretinal fluid. EZ - ellipsoid zone. ERM - epiretinal membrane. ORA - outer retinal atrophy. ORT - outer retinal tubulation. SRHM - subretinal hyper-reflective material                  ASSESSMENT/PLAN:    ICD-10-CM   1. Stable proliferative diabetic retinopathy of both eyes associated with type 2 diabetes mellitus ([place]) D32.2025   2. Retinal edema H35.81 OCT, Retina - OU - Both Eyes  3. Combined forms of age-related cataract of right  eye H25.811   4. Aphakia, left H27.02     1,2. Proliferative diabetic retinopathy w/o DME, OU (OD > OS) - The incidence, risk factors for progression, natural history and treatment options for diabetic  retinopathy were discussed with patient.   - The need for close monitoring of blood glucose, blood pressure, and serum lipids, avoiding cigarette or any type of tobacco, and the need for long term follow up was also discussed with patient. - exam shows  OD -- good prp laser in place; scattered fibrosis and atrophy; no obvious active  OS -- s/p PPV/PPL; good prp laser in place; severe hypoperfusion and scattered islands of fibrosis without significant traction - OCT without clinically significant diabetic macular edema, both eyes -- scattered atrophy and ERM OU - discussed findings and prognosis -- appears stable - f/u in 2-3 months -- will get an FA at that visit for complete retinal vascular eval  3. Combined form age-related cataract OD-  - The symptoms of cataract, surgical options, and treatments and risks were discussed with patient. - discussed diagnosis and progression - visually significant - refer to Dr. Shirleen Schirmer for expert cataract evaluation and treatment  4. Aphakia OS -  - phimosed capsule - monitor   Ophthalmic Meds Ordered this visit:  No orders of the defined types were placed in this encounter.      Return in about 3 months (around 04/24/2018) for F/U PDR OU.  There are no Patient Instructions on file for this visit.   Explained the diagnoses, plan, and follow up with the patient and they expressed understanding.  Patient expressed understanding of the importance of proper follow up care.   This document serves as a record of services personally performed by Gardiner Sleeper, MD, PhD. It was created on their behalf by Catha Brow, [place], a certified ophthalmic assistant. The creation of this record is the provider's dictation and/or activities during the visit.  Electronically signed by: Catha Brow, [place]  01/23/18 12:34 AM   Gardiner Sleeper, M.D., Ph.D. Diseases & Surgery of [facility] 01/23/18  I have  reviewed the above documentation for accuracy and completeness, and I agree with the above. Gardiner Sleeper, M.D., Ph.D. 01/23/18 12:34 AM     Abbreviations: M myopia (nearsighted); A astigmatism; H hyperopia (farsighted); P presbyopia; Mrx spectacle prescription;  CTL contact lenses; OD right eye; OS left eye; OU both eyes  XT exotropia; ET esotropia; PEK punctate epithelial keratitis; PEE punctate epithelial erosions; DES dry eye syndrome; MGD meibomian gland dysfunction; ATs artificial tears; PFAT's preservative free artificial tears; [place] nuclear sclerotic cataract; PSC posterior subcapsular cataract; ERM epi-retinal membrane; PVD posterior vitreous detachment; RD retinal detachment; DM diabetes mellitus; DR diabetic retinopathy; NPDR non-proliferative diabetic retinopathy; PDR proliferative diabetic retinopathy; CSME clinically significant macular edema; DME diabetic macular edema; dbh dot blot hemorrhages; CWS cotton wool spot; POAG primary open angle glaucoma; C/D cup-to-disc ratio; HVF humphrey visual field; GVF goldmann visual field; OCT optical coherence tomography; IOP intraocular pressure; BRVO Branch retinal vein occlusion; CRVO central retinal vein occlusion; CRAO central retinal artery occlusion; BRAO branch retinal artery occlusion; RT retinal tear; SB scleral buckle; PPV pars plana vitrectomy; VH Vitreous hemorrhage; PRP panretinal laser photocoagulation; IVK intravitreal kenalog; VMT vitreomacular traction; MH Macular hole;  NVD neovascularization of the disc; NVE neovascularization elsewhere; AREDS age related eye disease study; ARMD age related macular degeneration; POAG primary open angle glaucoma; EBMD epithelial/anterior basement membrane dystrophy; ACIOL  anterior chamber intraocular lens; IOL intraocular lens; PCIOL posterior chamber intraocular lens; Phaco/IOL phacoemulsification with intraocular lens placement; [place] photorefractive keratectomy; LASIK laser assisted in situ  keratomileusis; HTN hypertension; DM diabetes mellitus; COPD chronic obstructive pulmonary disease

## 2018-01-22 ENCOUNTER — Ambulatory Visit (INDEPENDENT_AMBULATORY_CARE_PROVIDER_SITE_OTHER): Payer: Medicare Other | Admitting: Ophthalmology

## 2018-01-22 ENCOUNTER — Encounter (INDEPENDENT_AMBULATORY_CARE_PROVIDER_SITE_OTHER): Payer: Self-pay | Admitting: Ophthalmology

## 2018-01-22 DIAGNOSIS — H3581 Retinal edema: Secondary | ICD-10-CM | POA: Diagnosis not present

## 2018-01-22 DIAGNOSIS — H25811 Combined forms of age-related cataract, right eye: Secondary | ICD-10-CM

## 2018-01-22 DIAGNOSIS — E113553 Type 2 diabetes mellitus with stable proliferative diabetic retinopathy, bilateral: Secondary | ICD-10-CM

## 2018-01-22 DIAGNOSIS — H2702 Aphakia, left eye: Secondary | ICD-10-CM | POA: Diagnosis not present

## 2018-02-03 ENCOUNTER — Ambulatory Visit: Payer: Medicare Other | Admitting: Family Medicine

## 2018-02-03 DIAGNOSIS — Z6841 Body Mass Index (BMI) 40.0 and over, adult: Secondary | ICD-10-CM | POA: Diagnosis not present

## 2018-02-03 DIAGNOSIS — Z1231 Encounter for screening mammogram for malignant neoplasm of breast: Secondary | ICD-10-CM | POA: Diagnosis not present

## 2018-02-03 DIAGNOSIS — Z760 Encounter for issue of repeat prescription: Secondary | ICD-10-CM | POA: Diagnosis not present

## 2018-02-03 DIAGNOSIS — I10 Essential (primary) hypertension: Secondary | ICD-10-CM | POA: Diagnosis not present

## 2018-02-03 DIAGNOSIS — T148XXA Other injury of unspecified body region, initial encounter: Secondary | ICD-10-CM | POA: Diagnosis not present

## 2018-02-03 NOTE — Progress Notes (Signed)
[name] [address], [place] [address] Phone: [phone] Subjective:    I'm seeing this patient by the request  of:    CC: Knee pain follow-up  [id number]  Rachel Holmes is a 57 y.o. female coming in with complaint of knee pain.  Patient was seen previously.  Patient had severe arthritis.  Failed all conservative therapy.  Tried Visco supplementation 1 month ago.  Feels like it did not help at all.  Having worsening pain at the moment.  Wants to attempt to lose weight but is finding it difficult because of the pain.  Has not been going to the gym regularly.  Doing vitamins occasionally and only doing the exercises occasionally.     Past Medical History:  Diagnosis Date  . Allergic rhinitis   . Anemia   . Anxiety   . Bradycardia   . Breast mass    Patient can no longer palpate specific masses but showed tech general area of concern  . CHF (congestive heart failure) ([place])   . CKD (chronic kidney disease)    STAGE 3  . Diabetes mellitus without complication ([place])   . GERD (gastroesophageal reflux disease)   . Hyperparathyroidism ([place])   . Hypertension   . Legally blind in left eye, as defined in [place]   . Lymphedema   . Onychomycosis    Past Surgical History:  Procedure Laterality Date  . ABDOMINAL HYSTERECTOMY    . BREAST BIOPSY Left 2014   FNA 12:00 position - Negative  . EYE SURGERY     Social History   Socioeconomic History  . Marital status: Unknown    Spouse name: Not on file  . Number of children: Not on file  . Years of education: Not on file  . Highest education level: Not on file  Occupational History  . Not on file  Social Needs  . Financial resource strain: Not on file  . Food insecurity:    Worry: Not on file    Inability: Not on file  . Transportation needs:    Medical: Not on file    Non-medical: Not on file  Tobacco Use  . Smoking status: Never Smoker  . Smokeless tobacco: Never Used  Substance  and Sexual Activity  . Alcohol use: No  . Drug use: Never  . Sexual activity: Not on file  Lifestyle  . Physical activity:    Days per week: Not on file    Minutes per session: Not on file  . Stress: Not on file  Relationships  . Social connections:    Talks on phone: Not on file    Gets together: Not on file    Attends religious service: Not on file    Active member of club or organization: Not on file    Attends meetings of clubs or organizations: Not on file    Relationship status: Not on file  Other Topics Concern  . Not on file  Social History Narrative   ** Merged History Encounter **       Allergies  Allergen Reactions  . Codeine Nausea Only  . Ibuprofen Other (See Comments)    Reaction:  Raises pts BP  . Penicillins Hives and Other (See Comments)    Has patient had a PCN reaction causing immediate rash, facial/tongue/throat swelling, SOB or lightheadedness with hypotension: No Has patient had a PCN reaction causing severe rash involving mucus membranes or skin necrosis: No Has patient had  a PCN reaction that required hospitalization No Has patient had a PCN reaction occurring within the last 10 years: No If all of the above answers are "NO", then may proceed with Cephalosporin use.  [name] Percocet [Oxycodone-Acetaminophen] Nausea Only  . Tramadol Nausea Only  . Vicodin [Hydrocodone-Acetaminophen] Nausea Only   Family History  Problem Relation Age of Onset  . Breast cancer Sister 53  . Diabetes Sister   . Diabetes Mother      Past medical history, social, surgical and family history all reviewed in electronic medical record.  No pertanent information unless stated regarding to the chief complaint.   Review of Systems:Review of systems updated and as accurate as of 02/04/18  No headache, visual changes, nausea, vomiting, diarrhea, constipation, dizziness, abdominal pain, skin rash, fevers, chills, night sweats, weight loss, swollen lymph nodes, body aches, joint  swelling, chest pain, shortness of breath, mood changes.  Positive muscle aches  Objective  Blood pressure (!) 160/90, pulse 69, height 5\' 8"  (1.727 m), weight (!) 310 lb (140.6 kg), SpO2 98 %. Systems examined below as of 02/04/18   General: No apparent distress alert and oriented x3 mood and affect normal, dressed appropriately.  HEENT: Pupils equal, extraocular movements intact  Respiratory: Patient's speak in full sentences and does not appear short of breath  Cardiovascular: Trace lower extremity edema, non tender, no erythema  Skin: Warm dry intact with no signs of infection or rash on extremities or on axial skeleton.  Abdomen: Soft nontender  Neuro: Cranial nerves II through XII are intact, neurovascularly intact in all extremities with 2+ DTRs and 2+ pulses.  Lymph: No lymphadenopathy of posterior or anterior cervical chain or axillae bilaterally.  Gait antalgic gait MSK:  Non tender with full range of motion and good stability and symmetric strength and tone of shoulders, elbows, wrist, hip and ankles bilaterally.  Knee: Right valgus deformity noted.  Abnormal thigh to calf ratio.  Tender to palpation over medial and PF joint line.  ROM full in flexion and extension and lower leg rotation. instability with valgus force.  painful patellar compression. Patellar glide with moderate crepitus. Patellar and quadriceps tendons unremarkable. Hamstring and quadriceps strength is normal. Contralateral knee shows arthritic changes as well  After informed written and verbal consent, patient was seated on exam table. Right knee was prepped with alcohol swab and utilizing anterolateral approach, patient's right knee space was injected with 4:1  marcaine 0.5%: Kenalog 40mg /dL. Patient tolerated the procedure well without immediate complications.    Impression and Recommendations:     This case required medical decision making of moderate complexity.      Note: This dictation was  prepared with Dragon dictation along with smaller phrase technology. Any transcriptional errors that result from this process are unintentional.

## 2018-02-04 ENCOUNTER — Ambulatory Visit (INDEPENDENT_AMBULATORY_CARE_PROVIDER_SITE_OTHER): Payer: Medicare Other | Admitting: Family Medicine

## 2018-02-04 ENCOUNTER — Encounter: Payer: Self-pay | Admitting: Family Medicine

## 2018-02-04 ENCOUNTER — Telehealth: Payer: Self-pay | Admitting: Endocrinology

## 2018-02-04 DIAGNOSIS — M1711 Unilateral primary osteoarthritis, right knee: Secondary | ICD-10-CM | POA: Diagnosis not present

## 2018-02-04 NOTE — Patient Instructions (Signed)
Good to see you  I am sorry that injectio did not help much  Tried another injection to buy time.  Keep working on the weight  Continue the vitamins Send message in 2 weeks and if not better may need to discuss with surgeon Otherwise can repeat injection every 3 months

## 2018-02-04 NOTE — Telephone Encounter (Signed)
Patient states Dr Loanne Drilling was suppose to be sending over a referral to the nutritionist for  Weight. And she has not heard anything and was wanting to follow up on this.  Please advise

## 2018-02-04 NOTE — Assessment & Plan Note (Signed)
Degenerative right knee.  Discussed with patient about icing regimen and home exercises.  Given another injection today.  We will see how patient responds.  Discussed otherwise we have failed all conservative therapy and patient would need to think about surgical intervention.  Patient is in agreement with the plan.  Otherwise can repeat injections every 3 months

## 2018-02-04 NOTE — Telephone Encounter (Signed)
Patient would like to know if there is anything else she can do to check her BS instead of sticking them, she states that her fingers stay numb from sticking them to check her blood sugar   Please advise

## 2018-02-05 MED ORDER — FREESTYLE LIBRE 14 DAY SENSOR MISC: 1.0000 | 3 refills | Status: DC

## 2018-02-05 MED ORDER — FREESTYLE LIBRE 14 DAY READER DEVI: 1.0000 | Freq: Once | 0 refills | Status: DC

## 2018-02-05 NOTE — Telephone Encounter (Signed)
I have sent a prescription to your pharmacy, for freestyle [place]. Please ask [facility] about referral

## 2018-02-05 NOTE — Telephone Encounter (Signed)
Pt called stating the meter that was called in, [facility] is telling her   Her insurance will not pay for Best number [phone] [facility] [address]  Pt would like a call back

## 2018-02-05 NOTE — Telephone Encounter (Signed)
Patient is unsure on how to insert sensor. Should she be referred to [facility] or nurse visit for me to show her  How to use?

## 2018-02-05 NOTE — Telephone Encounter (Signed)
I called patient & stated that we would have to do PA. I stated that pharmacy should sent over paperwork.

## 2018-02-06 ENCOUNTER — Telehealth: Payer: Self-pay | Admitting: Endocrinology

## 2018-02-06 DIAGNOSIS — M1460 Charcot's joint, unspecified site: Secondary | ICD-10-CM | POA: Diagnosis not present

## 2018-02-06 MED ORDER — GLUCOSE BLOOD VI STRP: 1.0000 | ORAL_STRIP | Freq: Four times a day (QID) | 5 refills | Status: DC

## 2018-02-06 NOTE — Telephone Encounter (Signed)
please call patient: In order to qualify for the freestyle libre, you would need to check cbg qid, and bring the log here to next ov

## 2018-02-06 NOTE — Telephone Encounter (Signed)
Please move up ov to next available.  We'll address then.

## 2018-02-10 NOTE — Telephone Encounter (Signed)
Left message on machine for patient to return our call 

## 2018-02-11 NOTE — Telephone Encounter (Signed)
I spoke with patient & have rescheduled for Friday 4/12.

## 2018-02-13 ENCOUNTER — Ambulatory Visit (INDEPENDENT_AMBULATORY_CARE_PROVIDER_SITE_OTHER): Payer: Medicare Other | Admitting: Endocrinology

## 2018-02-13 ENCOUNTER — Encounter: Payer: Self-pay | Admitting: Endocrinology

## 2018-02-13 VITALS — BP 132/74 | HR 52 | Wt 303.6 lb

## 2018-02-13 DIAGNOSIS — N183 Chronic kidney disease, stage 3 unspecified: Secondary | ICD-10-CM

## 2018-02-13 DIAGNOSIS — Z794 Long term (current) use of insulin: Secondary | ICD-10-CM | POA: Diagnosis not present

## 2018-02-13 DIAGNOSIS — E1122 Type 2 diabetes mellitus with diabetic chronic kidney disease: Secondary | ICD-10-CM | POA: Diagnosis not present

## 2018-02-13 LAB — POCT GLYCOSYLATED HEMOGLOBIN (HGB A1C): Hemoglobin A1C: 7.2

## 2018-02-13 MED ORDER — INSULIN DETEMIR 100 UNIT/ML FLEXPEN: 8.0000 [IU] | PEN_INJECTOR | Freq: Every day | SUBCUTANEOUS | 11 refills | Status: DC

## 2018-02-13 MED ORDER — INSULIN ASPART 100 UNIT/ML FLEXPEN: PEN_INJECTOR | SUBCUTANEOUS | 11 refills | Status: DC

## 2018-02-13 NOTE — Progress Notes (Signed)
Subjective:    Patient ID: Rondel Jumbo, female    DOB: [birth year], 57 y.o.   MRN: [id number]  HPI Pt returns for f/u of diabetes mellitus: DM type: Insulin-requiring type 2 Dx'ed: 1988, during a pregnancy, but it persisted after Complications: polyneuropathy, renal failure, leg ulcer, and PDR Therapy: insulin since soon after dx GDM: never DKA: never Severe hypoglycemia: 2 episodes (both many years ago).   Pancreatitis: never Pancreatic imaging: normal on 2008 [name] Other: she takes multiple daily injections; she requested to d/c trulicity.   Interval history: she has mild hypoglycemia approx twice a month.  This happens fasting.  It is highest in the afternoon.  pt states she feels well in general. Past Medical History:  Diagnosis Date  . Allergic rhinitis   . Anemia   . Anxiety   . Bradycardia   . Breast mass    Patient can no longer palpate specific masses but showed tech general area of concern  . CHF (congestive heart failure) ([place])   . CKD (chronic kidney disease)    STAGE 3  . Diabetes mellitus without complication ([place])   . GERD (gastroesophageal reflux disease)   . Hyperparathyroidism ([place])   . Hypertension   . Legally blind in left eye, as defined in [place]   . Lymphedema   . Onychomycosis     Past Surgical History:  Procedure Laterality Date  . ABDOMINAL HYSTERECTOMY    . BREAST BIOPSY Left 2014   FNA 12:00 position - Negative  . EYE SURGERY      Social History   Socioeconomic History  . Marital status: Unknown    Spouse name: Not on file  . Number of children: Not on file  . Years of education: Not on file  . Highest education level: Not on file  Occupational History  . Not on file  Social Needs  . Financial resource strain: Not on file  . Food insecurity:    Worry: Not on file    Inability: Not on file  . Transportation needs:    Medical: Not on file    Non-medical: Not on file  Tobacco Use  . Smoking status: Never Smoker  . Smokeless  tobacco: Never Used  Substance and Sexual Activity  . Alcohol use: No  . Drug use: Never  . Sexual activity: Not on file  Lifestyle  . Physical activity:    Days per week: Not on file    Minutes per session: Not on file  . Stress: Not on file  Relationships  . Social connections:    Talks on phone: Not on file    Gets together: Not on file    Attends religious service: Not on file    Active member of club or organization: Not on file    Attends meetings of clubs or organizations: Not on file    Relationship status: Not on file  . Intimate partner violence:    Fear of current or ex partner: Not on file    Emotionally abused: Not on file    Physically abused: Not on file    Forced sexual activity: Not on file  Other Topics Concern  . Not on file  Social History Narrative   ** Merged History Encounter **        Current Outpatient Medications on File Prior to Visit  Medication Sig Dispense Refill  . albuterol (PROVENTIL HFA;VENTOLIN HFA) 108 (90 Base) MCG/ACT inhaler Inhale 1-2 puffs into the lungs every 6 (six)  hours as needed for wheezing or shortness of breath.    [name] amLODipine (NORVASC) 10 MG tablet Take 10 mg by mouth daily.    [name] aspirin EC 325 MG tablet Take 325 mg by mouth daily.    . cloNIDine (CATAPRES) 0.3 MG tablet Take 0.3 mg by mouth 2 (two) times daily.    . Continuous Blood Gluc Sensor (FREESTYLE LIBRE 14 DAY SENSOR) MISC 1 Device by Does not apply route every 14 (fourteen) days. 6 each 3  . cyclobenzaprine (FLEXERIL) 10 MG tablet Take 1 tablet (10 mg total) by mouth 3 (three) times daily as needed for muscle spasms. 30 tablet 0  . diclofenac sodium (VOLTAREN) 1 % GEL Apply 2 g topically 4 (four) times daily as needed (for pain).    [name] ezetimibe (ZETIA) 10 MG tablet Take 10 mg by mouth at bedtime.    . ferrous sulfate 325 (65 FE) MG tablet Take 325 mg by mouth 3 (three) times daily with meals.    . fluticasone (FLONASE) 50 MCG/ACT nasal spray Place 1-2 sprays into  both nostrils daily as needed for rhinitis.    . furosemide (LASIX) 20 MG tablet Take 20 mg by mouth daily.    [name] gabapentin (NEURONTIN) 600 MG tablet Take 600 mg by mouth at bedtime.    [name] glucose blood (ACCU-CHEK GUIDE) test strip 1 each by Other route 4 (four) times daily. And lancets 4/day 120 each 5  . hydrOXYzine (ATARAX/VISTARIL) 25 MG tablet Take 25 mg by mouth 3 (three) times daily as needed for itching.    . isosorbide mononitrate (IMDUR) 120 MG 24 hr tablet Take 120 mg by mouth daily.    . meclizine (ANTIVERT) 25 MG tablet Take 1 tablet (25 mg total) by mouth 3 (three) times daily as needed for dizziness. 15 tablet 0  . meloxicam (MOBIC) 15 MG tablet Take 15 mg by mouth at bedtime.    . Multiple Vitamin (MULTIVITAMIN WITH MINERALS) TABS tablet Take 1 tablet by mouth daily.    [name] omeprazole (PRILOSEC) 40 MG capsule Take 40 mg by mouth 2 (two) times daily.    . ondansetron (ZOFRAN ODT) 4 MG disintegrating tablet 4mg  ODT q4 hours prn nausea/vomit 10 tablet 0  . ranitidine (ZANTAC) 300 MG tablet Take 300 mg by mouth at bedtime.    . vitamin B-12 (CYANOCOBALAMIN) 1000 MCG tablet Take 1,000 mcg by mouth daily.     No current facility-administered medications on file prior to visit.     Allergies  Allergen Reactions  . Codeine Nausea Only  . Ibuprofen Other (See Comments)    Reaction:  Raises pts BP  . Penicillins Hives and Other (See Comments)    Has patient had a PCN reaction causing immediate rash, facial/tongue/throat swelling, SOB or lightheadedness with hypotension: No Has patient had a PCN reaction causing severe rash involving mucus membranes or skin necrosis: No Has patient had a PCN reaction that required hospitalization No Has patient had a PCN reaction occurring within the last 10 years: No If all of the above answers are "NO", then may proceed with Cephalosporin use.  [name] Percocet [Oxycodone-Acetaminophen] Nausea Only  . Tramadol Nausea Only  . Vicodin  [Hydrocodone-Acetaminophen] Nausea Only    Family History  Problem Relation Age of Onset  . Breast cancer Sister 48  . Diabetes Sister   . Diabetes Mother     BP 132/74 (BP Location: Right Wrist, Patient Position: Sitting, Cuff Size: Normal)   Pulse (!) 52  Wt (!) 303 lb 9.6 oz (137.7 kg)   SpO2 95%   BMI 46.16 kg/m    Review of Systems Denies LOC    Objective:   Physical Exam VITAL SIGNS:  See vs page GENERAL: no distress Pulses: foot pulses are intact bilaterally.   MSK: chronic deformity left foot/ankle is noted.  CV: 2+ edema of the left leg, and 1+ on the right Skin:  no ulcer on the feet or ankles.  normal color and temp on the feet and ankles Neuro: sensation is intact to touch on the feet and ankles, but decreased from normal.    Lab Results  Component Value Date   CREATININE 1.69 (H) 02/03/2017   BUN 29 (H) 02/03/2017   NA 140 02/03/2017   K 3.8 02/03/2017   CL 105 02/03/2017   CO2 26 02/03/2017       Assessment & Plan:  Insulin-requiring type 2 DM, with renal failure. Based on the pattern of her cbg's, she needs some adjustment in her therapy.    Patient Instructions  check your blood sugar twice a day.  vary the time of day when you check, between before the 3 meals, and at bedtime.  also check if you have symptoms of your blood sugar being too high or too low.  please keep a record of the readings and bring it to your next appointment here (or you can bring the meter itself).  You can write it on any piece of paper.  please call us sooner if your blood sugar goes below 70, or if you have a lot of readings over 200. For now, please: change the novolog to 3 times a day, 12-11-8 units, and:  reduce the levemir to 8 units at bedtime.  We'll call to request authorization for the freestyle libre.   Please come back for a follow-up appointment in 3 months.     Bariatric Surgery You have so much to gain by losing weight.  You may have already tried every  diet and exercise plan imaginable.  And, you may have sought advice from your family physician, too.   Sometimes, in spite of such diligent efforts, you may not be able to achieve long-term results by yourself.  In cases of severe obesity, bariatric or weight loss surgery is a proven method of achieving long-term weight control.  Our Services Our bariatric surgery programs offer our patients new hope and long-term weight-loss solution.  Since introducing our services in 2003, we have conducted more than 2,400 successful procedures.  Our program is designated as a Programmer, multimedia by the Metabolic and Bariatric Surgery Accreditation and Quality Improvement Program (MBSAQIP), a IT trainer that sets rigorous patient safety and outcome standards.  Our program is also designated as a Ecologist by [employer].   Our exceptional weight-loss surgery team specializes in diagnosis, treatment, follow-up care, and ongoing support for our patients with severe weight loss challenges.  We currently offer laparoscopic sleeve gastrectomy, gastric bypass, and adjustable gastric band (LAP-BAND).    Attend our [place] Choosing to undergo a bariatric procedure is a big decision, and one that should not be taken lightly.  You now have two options in how you learn about weight-loss surgery - in person or online.  Our objective is to ensure you have all of the information that you need to evaluate the advantages and obligations of this life changing procedure.  Please note that you are not alone in this process,  and our experienced team is ready to assist and answer all of your questions.  There are several ways to register for a seminar (either on-line or in person): 1)  Call [phone] 2) Go on-line to [facility] and register for either type of seminar.  [email or web address]

## 2018-02-13 NOTE — Patient Instructions (Addendum)
check your blood sugar twice a day.  vary the time of day when you check, between before the 3 meals, and at bedtime.  also check if you have symptoms of your blood sugar being too high or too low.  please keep a record of the readings and bring it to your next appointment here (or you can bring the meter itself).  You can write it on any piece of paper.  please call us sooner if your blood sugar goes below 70, or if you have a lot of readings over 200. For now, please: change the novolog to 3 times a day, 12-11-8 units, and:  reduce the levemir to 8 units at bedtime.  We'll call to request authorization for the freestyle libre.   Please come back for a follow-up appointment in 3 months.     Bariatric Surgery You have so much to gain by losing weight.  You may have already tried every diet and exercise plan imaginable.  And, you may have sought advice from your family physician, too.   Sometimes, in spite of such diligent efforts, you may not be able to achieve long-term results by yourself.  In cases of severe obesity, bariatric or weight loss surgery is a proven method of achieving long-term weight control.  Our Services Our bariatric surgery programs offer our patients new hope and long-term weight-loss solution.  Since introducing our services in 2003, we have conducted more than 2,400 successful procedures.  Our program is designated as a Programmer, multimedia by the Metabolic and Bariatric Surgery Accreditation and Quality Improvement Program (MBSAQIP), a IT trainer that sets rigorous patient safety and outcome standards.  Our program is also designated as a Ecologist by [employer].   Our exceptional weight-loss surgery team specializes in diagnosis, treatment, follow-up care, and ongoing support for our patients with severe weight loss challenges.  We currently offer laparoscopic sleeve gastrectomy, gastric bypass, and adjustable gastric band (LAP-BAND).     Attend our [place] Choosing to undergo a bariatric procedure is a big decision, and one that should not be taken lightly.  You now have two options in how you learn about weight-loss surgery - in person or online.  Our objective is to ensure you have all of the information that you need to evaluate the advantages and obligations of this life changing procedure.  Please note that you are not alone in this process, and our experienced team is ready to assist and answer all of your questions.  There are several ways to register for a seminar (either on-line or in person): 1)  Call [phone] 2) Go on-line to [facility] and register for either type of seminar.  [email or web address]

## 2018-02-18 ENCOUNTER — Telehealth: Payer: Self-pay

## 2018-02-18 NOTE — Telephone Encounter (Signed)
I called patient & asked her to call back to give me number for her [place] insurance, so I can start PA for freestyle [place].

## 2018-02-19 ENCOUNTER — Encounter (INDEPENDENT_AMBULATORY_CARE_PROVIDER_SITE_OTHER): Payer: Medicare Other

## 2018-02-23 ENCOUNTER — Other Ambulatory Visit: Payer: Self-pay

## 2018-02-23 MED ORDER — FREESTYLE LIBRE 14 DAY SENSOR MISC: 1.0000 | 99 refills | Status: DC

## 2018-02-24 ENCOUNTER — Other Ambulatory Visit: Payer: Self-pay | Admitting: *Deleted

## 2018-02-24 MED ORDER — FREESTYLE LIBRE 14 DAY SENSOR MISC: 1.0000 | 11 refills | Status: DC

## 2018-02-25 DIAGNOSIS — H2702 Aphakia, left eye: Secondary | ICD-10-CM | POA: Diagnosis not present

## 2018-02-25 DIAGNOSIS — H35033 Hypertensive retinopathy, bilateral: Secondary | ICD-10-CM | POA: Diagnosis not present

## 2018-02-25 DIAGNOSIS — E113553 Type 2 diabetes mellitus with stable proliferative diabetic retinopathy, bilateral: Secondary | ICD-10-CM | POA: Diagnosis not present

## 2018-02-25 DIAGNOSIS — H25811 Combined forms of age-related cataract, right eye: Secondary | ICD-10-CM | POA: Diagnosis not present

## 2018-02-26 ENCOUNTER — Ambulatory Visit (INDEPENDENT_AMBULATORY_CARE_PROVIDER_SITE_OTHER): Payer: Medicare Other

## 2018-03-02 ENCOUNTER — Ambulatory Visit (INDEPENDENT_AMBULATORY_CARE_PROVIDER_SITE_OTHER): Payer: Medicare Other | Admitting: Family Medicine

## 2018-03-03 ENCOUNTER — Ambulatory Visit (INDEPENDENT_AMBULATORY_CARE_PROVIDER_SITE_OTHER): Payer: Medicare Other | Admitting: Family Medicine

## 2018-03-03 ENCOUNTER — Encounter (INDEPENDENT_AMBULATORY_CARE_PROVIDER_SITE_OTHER): Payer: Self-pay

## 2018-03-04 ENCOUNTER — Ambulatory Visit: Payer: Medicare Other | Admitting: Endocrinology

## 2018-03-11 ENCOUNTER — Ambulatory Visit (INDEPENDENT_AMBULATORY_CARE_PROVIDER_SITE_OTHER): Payer: Medicare HMO | Admitting: Family Medicine

## 2018-03-11 ENCOUNTER — Encounter (INDEPENDENT_AMBULATORY_CARE_PROVIDER_SITE_OTHER): Payer: Self-pay | Admitting: Family Medicine

## 2018-03-11 VITALS — BP 173/79 | HR 85 | Temp 97.9°F | Ht 66.0 in | Wt 301.0 lb

## 2018-03-11 DIAGNOSIS — Z794 Long term (current) use of insulin: Secondary | ICD-10-CM | POA: Diagnosis not present

## 2018-03-11 DIAGNOSIS — I509 Heart failure, unspecified: Secondary | ICD-10-CM

## 2018-03-11 DIAGNOSIS — E1140 Type 2 diabetes mellitus with diabetic neuropathy, unspecified: Secondary | ICD-10-CM

## 2018-03-11 DIAGNOSIS — R0602 Shortness of breath: Secondary | ICD-10-CM

## 2018-03-11 DIAGNOSIS — R5383 Other fatigue: Secondary | ICD-10-CM | POA: Diagnosis not present

## 2018-03-11 DIAGNOSIS — E559 Vitamin D deficiency, unspecified: Secondary | ICD-10-CM

## 2018-03-11 DIAGNOSIS — Z1331 Encounter for screening for depression: Secondary | ICD-10-CM | POA: Diagnosis not present

## 2018-03-11 DIAGNOSIS — Z6841 Body Mass Index (BMI) 40.0 and over, adult: Secondary | ICD-10-CM

## 2018-03-11 DIAGNOSIS — Z0289 Encounter for other administrative examinations: Secondary | ICD-10-CM

## 2018-03-11 DIAGNOSIS — I10 Essential (primary) hypertension: Secondary | ICD-10-CM | POA: Insufficient documentation

## 2018-03-12 LAB — CBC WITH DIFFERENTIAL
Basophils Absolute: 0 10*3/uL (ref 0.0–0.2)
Basos: 0 %
EOS (ABSOLUTE): 0.1 10*3/uL (ref 0.0–0.4)
Eos: 2 %
Hematocrit: 30.6 % — ABNORMAL LOW (ref 34.0–46.6)
Hemoglobin: 10 g/dL — ABNORMAL LOW (ref 11.1–15.9)
Immature Grans (Abs): 0 10*3/uL (ref 0.0–0.1)
Immature Granulocytes: 0 %
Lymphocytes Absolute: 2 10*3/uL (ref 0.7–3.1)
Lymphs: 29 %
MCH: 28.8 pg (ref 26.6–33.0)
MCHC: 32.7 g/dL (ref 31.5–35.7)
MCV: 88 fL (ref 79–97)
Monocytes Absolute: 0.6 10*3/uL (ref 0.1–0.9)
Monocytes: 9 %
Neutrophils Absolute: 4.1 10*3/uL (ref 1.4–7.0)
Neutrophils: 60 %
RBC: 3.47 x10E6/uL — ABNORMAL LOW (ref 3.77–5.28)
RDW: 14.4 % (ref 12.3–15.4)
WBC: 6.9 10*3/uL (ref 3.4–10.8)

## 2018-03-12 LAB — COMPREHENSIVE METABOLIC PANEL
ALT: 14 IU/L (ref 0–32)
AST: 17 IU/L (ref 0–40)
Albumin/Globulin Ratio: 1.2 (ref 1.2–2.2)
Albumin: 3.7 g/dL (ref 3.5–5.5)
Alkaline Phosphatase: 143 IU/L — ABNORMAL HIGH (ref 39–117)
BUN/Creatinine Ratio: 14 (ref 9–23)
BUN: 30 mg/dL — ABNORMAL HIGH (ref 6–24)
Bilirubin Total: 0.3 mg/dL (ref 0.0–1.2)
CO2: 22 mmol/L (ref 20–29)
Calcium: 8.7 mg/dL (ref 8.7–10.2)
Chloride: 107 mmol/L — ABNORMAL HIGH (ref 96–106)
Creatinine, Ser: 2.19 mg/dL — ABNORMAL HIGH (ref 0.57–1.00)
GFR calc Af Amer: 28 mL/min/{1.73_m2} — ABNORMAL LOW (ref >59)
GFR calc non Af Amer: 24 mL/min/{1.73_m2} — ABNORMAL LOW (ref >59)
Globulin, Total: 3 g/dL (ref 1.5–4.5)
Glucose: 124 mg/dL — ABNORMAL HIGH (ref 65–99)
Potassium: 4.3 mmol/L (ref 3.5–5.2)
Sodium: 143 mmol/L (ref 134–144)
Total Protein: 6.7 g/dL (ref 6.0–8.5)

## 2018-03-12 LAB — LIPID PANEL WITH LDL/HDL RATIO
Cholesterol, Total: 209 mg/dL — ABNORMAL HIGH (ref 100–199)
HDL: 67 mg/dL (ref >39)
LDL Calculated: 121 mg/dL — ABNORMAL HIGH (ref 0–99)
LDl/HDL Ratio: 1.8 ratio (ref 0.0–3.2)
Triglycerides: 107 mg/dL (ref 0–149)
VLDL Cholesterol Cal: 21 mg/dL (ref 5–40)

## 2018-03-12 LAB — MICROALBUMIN / CREATININE URINE RATIO
Creatinine, Urine: 63.7 mg/dL
Microalb/Creat Ratio: 3492.2 mg/g creat — ABNORMAL HIGH (ref 0.0–30.0)
Microalbumin, Urine: 2224.5 ug/mL

## 2018-03-12 LAB — TSH: TSH: 1.17 u[IU]/mL (ref 0.450–4.500)

## 2018-03-12 LAB — VITAMIN D 25 HYDROXY (VIT D DEFICIENCY, FRACTURES): Vit D, 25-Hydroxy: 21.4 ng/mL — ABNORMAL LOW (ref 30.0–100.0)

## 2018-03-12 LAB — T3: T3, Total: 104 ng/dL (ref 71–180)

## 2018-03-12 LAB — T4, FREE: Free T4: 1.1 ng/dL (ref 0.82–1.77)

## 2018-03-17 NOTE — Progress Notes (Signed)
Office: [phone]  /  Fax: [phone]   Dear Dr. Loanne Drilling,   Thank you for referring Rachel Holmes to our clinic. The following note includes my evaluation and treatment recommendations.  HPI:   Chief Complaint: OBESITY    Rachel Holmes has been referred by Renato Shin, MD for consultation regarding her obesity and obesity related comorbidities.    Rachel Holmes (MR# [id number]) is a 57 y.o. female who presents on 03/11/2018 for obesity evaluation and treatment. Current BMI is Body mass index is 48.58 kg/m.[name] Rachel Holmes has been struggling with her weight for many years and has been unsuccessful in either losing weight, maintaining weight loss, or reaching her healthy weight goal.     Rachel Holmes attended our information session and states she is currently in the action stage of change and ready to dedicate time achieving and maintaining a healthier weight. Rachel Holmes is interested in becoming our patient and working on intensive lifestyle modifications including (but not limited to) diet, exercise and weight loss.    Rachel Holmes states her desired weight loss is 131 lbs she has been heavy most of  her life she started gaining weight after having babies her heaviest weight ever was 311 lbs she has significant food cravings issues  she snacks frequently in the evenings she skips meals frequently she frequently makes poor food choices she struggles with emotional eating    Fatigue Rachel Holmes feels her energy is lower than it should be. This has worsened with weight gain and has not worsened recently. Akili admits to daytime somnolence and  admits to waking up still tired. Patient is at risk for obstructive sleep apnea. Rachel Holmes has a history of symptoms of daytime fatigue. Patient generally gets 7 hours of sleep per night, and states they generally have nightime awakenings. Snoring is present. Apneic episodes are not present. Epworth Sleepiness Score is 3.  Dyspnea on exertion Rachel Holmes notes increasing  shortness of breath with exercising and seems to be worsening over time with weight gain. She notes getting out of breath sooner with activity than she used to. This has not gotten worse recently. Rachel Holmes denies orthopnea.  Diabetes II Rachel Holmes has a diagnosis of diabetes type II. Rachel Holmes has nephropathy and neuropathy. No glucose log today. Recent A1c of 7.2. She denies any hypoglycemic episodes. She has been working on intensive lifestyle modifications including diet, exercise, and weight loss to help control her blood glucose levels.  Hypertension Rachel Holmes is a 57 y.o. female with hypertension. Rachel Holmes's blood pressure is elevated today, she states normally better controlled on medications. She denies chest pain or shortness of breath. She is working weight loss to help control her blood pressure with the goal of decreasing her risk of heart attack and stroke. Azra's blood pressure is not currently controlled.  Vitamin D Deficiency Dioselina has a diagnosis of vitamin D deficiency. She is not on Vit D currently, she notes fatigue and denies nausea, vomiting or muscle weakness.  Congestive Heart Failure Rachel Holmes has a history of congestive heart failure unknown type, on Lasix daily but not K+. EKG shows left ventricular hypertrophy, shortness of breath with activity but not at rest.  Depression Screen Rachel Holmes Food and Mood (modified PHQ-9) score was  Depression screen PHQ 2/9 03/11/2018  Decreased Interest 3  Down, Depressed, Hopeless 2  PHQ - 2 Score 5  Altered sleeping 2  Tired, decreased energy 2  Change in appetite 2  Feeling bad or failure about yourself  1  Trouble concentrating  0  Moving slowly or fidgety/restless 2  Suicidal thoughts 0  PHQ-9 Score 14  Difficult doing work/chores Somewhat difficult    ALLERGIES: Allergies  Allergen Reactions  . Codeine Nausea Only  . Ibuprofen Other (See Comments)    Reaction:  Raises pts BP  . Penicillins Hives and Other (See Comments)    Has  patient had a PCN reaction causing immediate rash, facial/tongue/throat swelling, SOB or lightheadedness with hypotension: No Has patient had a PCN reaction causing severe rash involving mucus membranes or skin necrosis: No Has patient had a PCN reaction that required hospitalization No Has patient had a PCN reaction occurring within the last 10 years: No If all of the above answers are "NO", then may proceed with Cephalosporin use.  [name] Percocet [Oxycodone-Acetaminophen] Nausea Only  . Tramadol Nausea Only  . Vicodin [Hydrocodone-Acetaminophen] Nausea Only    MEDICATIONS: Current Outpatient Medications on File Prior to Visit  Medication Sig Dispense Refill  . albuterol (PROVENTIL HFA;VENTOLIN HFA) 108 (90 Base) MCG/ACT inhaler Inhale 1-2 puffs into the lungs every 6 (six) hours as needed for wheezing or shortness of breath.    [name] amLODipine (NORVASC) 10 MG tablet Take 10 mg by mouth daily.    [name] aspirin EC 325 MG tablet Take 325 mg by mouth daily.    . cloNIDine (CATAPRES) 0.3 MG tablet Take 0.3 mg by mouth 2 (two) times daily.    . Continuous Blood Gluc Sensor (FREESTYLE LIBRE 14 DAY SENSOR) MISC 1 Device by Does not apply route every 14 (fourteen) days. 6 each 11  . ferrous sulfate 325 (65 FE) MG tablet Take 325 mg by mouth 3 (three) times daily with meals.    . furosemide (LASIX) 20 MG tablet Take 20 mg by mouth daily.    [name] gabapentin (NEURONTIN) 600 MG tablet Take 600 mg by mouth at bedtime.    [name] glucose blood (ACCU-CHEK GUIDE) test strip 1 each by Other route 4 (four) times daily. And lancets 4/day 120 each 5  . Insulin Detemir (LEVEMIR FLEXTOUCH) 100 UNIT/ML Pen Inject 8 Units into the skin at bedtime. 15 mL 11  . isosorbide mononitrate (IMDUR) 120 MG 24 hr tablet Take 120 mg by mouth daily.    . meloxicam (MOBIC) 15 MG tablet Take 15 mg by mouth at bedtime.    . Multiple Vitamin (MULTIVITAMIN WITH MINERALS) TABS tablet Take 1 tablet by mouth daily.    [name] omeprazole (PRILOSEC) 40 MG  capsule Take 40 mg by mouth 2 (two) times daily.    . ranitidine (ZANTAC) 300 MG tablet Take 300 mg by mouth at bedtime.     No current facility-administered medications on file prior to visit.     PAST MEDICAL HISTORY: Past Medical History:  Diagnosis Date  . Allergic rhinitis   . Anemia   . Anxiety   . Back pain   . Bradycardia   . Breast mass    Patient can no longer palpate specific masses but showed tech general area of concern  . CHF (congestive heart failure) ([place])   . CKD (chronic kidney disease)    STAGE 3  . Constipation   . Diabetes mellitus without complication ([place])   . Dyspnea   . GERD (gastroesophageal reflux disease)   . HLD (hyperlipidemia)   . Hyperparathyroidism ([place])   . Hypertension   . Joint pain   . Leg edema   . Legally blind in left eye, as defined in [place]   . Lymphedema   .  Onychomycosis     PAST SURGICAL HISTORY: Past Surgical History:  Procedure Laterality Date  . ABDOMINAL HYSTERECTOMY    . BREAST BIOPSY Left 2014   FNA 12:00 position - Negative  . EYE SURGERY      SOCIAL HISTORY: Social History   Tobacco Use  . Smoking status: Never Smoker  . Smokeless tobacco: Never Used  Substance Use Topics  . Alcohol use: No  . Drug use: Never    FAMILY HISTORY: Family History  Problem Relation Age of Onset  . Breast cancer Sister 68  . Diabetes Sister   . Diabetes Mother   . Hypertension Mother   . Hyperlipidemia Mother   . Eating disorder Mother   . Obesity Mother     ROS: Review of Systems  Constitutional: Positive for malaise/fatigue. Negative for weight loss.       + Trouble sleeping  Eyes:       + Wear glasses  Respiratory: Positive for shortness of breath (with exertion) and wheezing.   Cardiovascular: Negative for chest pain and orthopnea.       + Leg cramping  Gastrointestinal: Positive for constipation.  Musculoskeletal:       Negative muscle weakness + Muscle stiffness + Red or swollen joints  Neurological:  Positive for headaches.    PHYSICAL EXAM: Blood pressure (!) 173/79, pulse 85, temperature 97.9 F (36.6 C), temperature source Oral, height 5\' 6"  (1.676 m), weight (!) 301 lb (136.5 kg), SpO2 97 %. Body mass index is 48.58 kg/m. Physical Exam  Constitutional: She is oriented to person, place, and time. She appears well-developed and well-nourished.  HENT:  Head: Normocephalic and atraumatic.  Nose: Nose normal.  Eyes: EOM are normal. No scleral icterus.  Neck: Normal range of motion. Neck supple. No thyromegaly present.  Cardiovascular: Normal rate and regular rhythm.  Murmur (3/6 holosystolic murmur) heard. Pulmonary/Chest: Effort normal. No respiratory distress.  Abdominal: Soft. There is no tenderness.  + Obesity  Musculoskeletal:  Range of Motion normal in all 4 extremities 2+ edema noted in bilateral lower extremities  Neurological: She is alert and oriented to person, place, and time. Coordination normal.  Skin: Skin is warm and dry.  Psychiatric: She has a normal mood and affect. Her behavior is normal.  Vitals reviewed.   RECENT LABS AND TESTS: BMET    Component Value Date/Time   NA 143 03/11/2018 1002   NA 137 11/21/2013 0627   K 4.3 03/11/2018 1002   K 4.6 11/21/2013 0627   CL 107 (H) 03/11/2018 1002   CL 107 11/21/2013 0627   CO2 22 03/11/2018 1002   CO2 24 11/21/2013 0627   GLUCOSE 124 (H) 03/11/2018 1002   GLUCOSE 86 02/03/2017 1119   GLUCOSE 95 11/21/2013 0627   BUN 30 (H) 03/11/2018 1002   BUN 34 (H) 11/21/2013 0627   CREATININE 2.19 (H) 03/11/2018 1002   CREATININE 1.80 (H) 09/09/2014 1220   CALCIUM 8.7 03/11/2018 1002   CALCIUM 8.8 09/09/2014 1220   GFRNONAA 24 (L) 03/11/2018 1002   GFRNONAA 31 (L) 09/09/2014 1220   GFRNONAA 26 (L) 11/21/2013 0627   GFRAA 28 (L) 03/11/2018 1002   GFRAA 38 (L) 09/09/2014 1220   GFRAA 30 (L) 11/21/2013 0627   Lab Results  Component Value Date   HGBA1C 7.2 02/13/2018   No results found for: INSULIN CBC      Component Value Date/Time   WBC 6.9 03/11/2018 1002   WBC 7.8 02/03/2017 1119   RBC 3.47 (  L) 03/11/2018 1002   RBC 4.26 02/03/2017 1119   HGB 10.0 (L) 03/11/2018 1002   HCT 30.6 (L) 03/11/2018 1002   PLT 230 02/03/2017 1119   PLT 210 09/09/2014 1220   MCV 88 03/11/2018 1002   MCV 87 09/09/2014 1220   MCH 28.8 03/11/2018 1002   MCH 28.2 02/03/2017 1119   MCHC 32.7 03/11/2018 1002   MCHC 32.4 02/03/2017 1119   RDW 14.4 03/11/2018 1002   RDW 15.1 (H) 09/09/2014 1220   LYMPHSABS 2.0 03/11/2018 1002   LYMPHSABS 2.0 09/09/2014 1220   MONOABS 0.4 02/03/2017 1119   MONOABS 0.5 09/09/2014 1220   EOSABS 0.1 03/11/2018 1002   EOSABS 0.4 09/09/2014 1220   BASOSABS 0.0 03/11/2018 1002   BASOSABS 0.1 09/09/2014 1220   Iron/TIBC/Ferritin/ %Sat    Component Value Date/Time   IRON 39 (L) 10/07/2014 1527   TIBC 202 (L) 10/07/2014 1527   FERRITIN 176 08/04/2014 1527   IRONPCTSAT 19 10/07/2014 1527   Lipid Panel     Component Value Date/Time   CHOL 209 (H) 03/11/2018 1002   CHOL 157 11/20/2013 0954   TRIG 107 03/11/2018 1002   TRIG 114 11/20/2013 0954   HDL 67 03/11/2018 1002   HDL 38 (L) 11/20/2013 0954   VLDL 23 11/20/2013 0954   LDLCALC 121 (H) 03/11/2018 1002   LDLCALC 96 11/20/2013 0954   Hepatic Function Panel     Component Value Date/Time   PROT 6.7 03/11/2018 1002   PROT 7.9 11/19/2013 0934   ALBUMIN 3.7 03/11/2018 1002   ALBUMIN 3.2 (L) 11/19/2013 0934   AST 17 03/11/2018 1002   AST 19 11/19/2013 0934   ALT 14 03/11/2018 1002   ALT 20 11/19/2013 0934   ALKPHOS 143 (H) 03/11/2018 1002   ALKPHOS 95 11/19/2013 0934   BILITOT 0.3 03/11/2018 1002   BILITOT 0.3 11/19/2013 0934      Component Value Date/Time   TSH 1.170 03/11/2018 1002   TSH 1.42 11/20/2013 0954    ECG  shows NSR with a rate of 76 BPM INDIRECT CALORIMETER done today shows a VO2 of 208 and a REE of 1445.  Her calculated basal metabolic rate is 9924 thus her basal metabolic rate is worse than  expected.    ASSESSMENT AND PLAN: Other fatigue - Plan: EKG 12-Lead, CBC With Differential, T3, T4, free, TSH  Shortness of breath on exertion - Plan: CBC With Differential  Type 2 diabetes mellitus with diabetic neuropathy, with long-term current use of insulin ([place]) - Plan: Comprehensive metabolic panel, Microalbumin / creatinine urine ratio  Essential hypertension - Plan: Lipid Panel With LDL/HDL Ratio  Vitamin D deficiency - Plan: VITAMIN D 25 Hydroxy (Vit-D Deficiency, Fractures)  Other congestive heart failure (H[facility])  Depression screening  Class 3 severe obesity with serious comorbidity and body mass index (BMI) of 45.0 to 49.9 in adult, unspecified obesity type ([place])  PLAN:  Fatigue Rachel Holmes was informed that her fatigue may be related to obesity, depression or many other causes. Labs will be ordered, and in the meanwhile Blossie has agreed to work on diet, exercise and weight loss to help with fatigue. Proper sleep hygiene was discussed including the need for 7-8 hours of quality sleep each night. A sleep study was not ordered based on symptoms and Epworth score.  Dyspnea on exertion Rachel Holmes's shortness of breath appears to be obesity related and exercise induced. She has agreed to work on weight loss and gradually increase exercise to treat her exercise  induced shortness of breath. If Rachel Holmes follows our instructions and loses weight without improvement of her shortness of breath, we will plan to refer to pulmonology. We will monitor this condition regularly. Rachel Holmes agrees to this plan.  Diabetes II Rachel Holmes has been given extensive diabetes education by myself today including ideal fasting and post-prandial blood glucose readings, individual ideal Hgb A1c goals and hypoglycemia prevention. We discussed the importance of good blood sugar control to decrease the likelihood of diabetic complications such as nephropathy, neuropathy, limb loss, blindness, coronary artery disease, and death. We  discussed the importance of intensive lifestyle modification including diet, exercise and weight loss as the first line treatment for diabetes. Savon agrees to continue stop Novolog and continue Levemir, and start diet prescription. Girl agrees to follow up with our clinic in 2 weeks.  Hypertension We discussed sodium restriction, working on healthy weight loss, and a regular exercise program as the means to achieve improved blood pressure control. Rachel Holmes agreed with this plan and agreed to follow up as directed. We will continue to monitor her blood pressure as well as her progress with the above lifestyle modifications. We will check labs, she will continue her medications, start diet prescription and will watch for signs of hypotension as she continues her lifestyle modifications. Anslee agrees to follow up with our clinic in 2 weeks and we will recheck blood pressure at that time.  Vitamin D Deficiency Elena was informed that low vitamin D levels contributes to fatigue and are associated with obesity, breast, and colon cancer. She will follow up for routine testing of vitamin D, at least 2-3 times per year. She was informed of the risk of over-replacement of vitamin D and agrees to not increase her dose unless she discusses this with [name] first. We will check labs and Angelo agrees to follow up with our clinic in 2 weeks.  Congestive Heart Failure Meshawn will start diet prescription and continue Lasix. We will check BMP and Krystyna agrees to follow up with our clinic in 2 weeks.  Depression Screen Margie had a moderately positive depression screening. Depression is commonly associated with obesity and often results in emotional eating behaviors. We will monitor this closely and work on CBT to help improve the non-hunger eating patterns. Referral to Psychology may be required if no improvement is seen as she continues in our clinic.  Obesity Deloria is currently in the action stage of change and her goal is  to continue with weight loss efforts. I recommend Chauntel begin the structured treatment plan as follows:  She has agreed to follow the Category 2 plan Carra has been instructed to eventually work up to a goal of 150 minutes of combined cardio and strengthening exercise per week for weight loss and overall health benefits. We discussed the following Behavioral Modification Strategies today: increasing lean protein intake, decreasing simple carbohydrates , decrease eating out and work on meal planning and easy cooking plans   She was informed of the importance of frequent follow up visits to maximize her success with intensive lifestyle modifications for her multiple health conditions. She was informed we would discuss her lab results at her next visit unless there is a critical issue that needs to be addressed sooner. Alayne agreed to keep her next visit at the agreed upon time to discuss these results.    OBESITY BEHAVIORAL INTERVENTION VISIT  Today's visit was # 1 out of 22.  Starting weight: 301 lbs Starting date: 03/11/18 Today's weight : 301 lbs  Today's date: 03/11/2018 Total lbs lost to date: 0 (Patients must lose 7 lbs in the first 6 months to continue with counseling)   ASK: We discussed the diagnosis of obesity with Rondel Jumbo today and Kylah agreed to give [name] permission to discuss obesity behavioral modification therapy today.  ASSESS: Renate has the diagnosis of obesity and her BMI today is 48.61 Tyrone is in the action stage of change   ADVISE: Racine was educated on the multiple health risks of obesity as well as the benefit of weight loss to improve her health. She was advised of the need for long term treatment and the importance of lifestyle modifications.  AGREE: Multiple dietary modification options and treatment options were discussed and  Christinamarie agreed to the above obesity treatment plan.   I, Trixie Dredge, am acting as transcriptionist for Dennard Nip,  MD   I have reviewed the above documentation for accuracy and completeness, and I agree with the above. -Dennard Nip, MD

## 2018-03-18 ENCOUNTER — Other Ambulatory Visit: Payer: Self-pay

## 2018-03-18 ENCOUNTER — Telehealth: Payer: Self-pay

## 2018-03-18 ENCOUNTER — Telehealth: Payer: Self-pay | Admitting: Endocrinology

## 2018-03-18 MED ORDER — GLUCOSE BLOOD VI STRP: ORAL_STRIP | 12 refills | Status: DC

## 2018-03-18 NOTE — Telephone Encounter (Signed)
Patient called to let us know what type of meter she is now using. She was using what she called a One Touch Ultra Slim? She asked if we had the meter because she only had the strips. Her insurance no longer covered Accu-Chek. I stated that I could send her in verio strips & she could come to our office to get a meter. We had no One Touch Ultra meters.

## 2018-03-18 NOTE — Telephone Encounter (Signed)
error 

## 2018-03-24 ENCOUNTER — Telehealth: Payer: Self-pay | Admitting: Endocrinology

## 2018-03-24 NOTE — Telephone Encounter (Signed)
pts insurance will no longer cover accucheck it will cover one touch ultra please call in for a new kit please call this rx into [facility] on [address] [phone]

## 2018-03-24 NOTE — Telephone Encounter (Signed)
The pt states we were also awaiting the insurance phone number it is # [phone]

## 2018-03-25 ENCOUNTER — Ambulatory Visit (INDEPENDENT_AMBULATORY_CARE_PROVIDER_SITE_OTHER): Payer: Medicare HMO | Admitting: Family Medicine

## 2018-03-25 VITALS — BP 180/73 | HR 54 | Temp 97.7°F | Ht 66.0 in | Wt 296.0 lb

## 2018-03-25 DIAGNOSIS — Z6841 Body Mass Index (BMI) 40.0 and over, adult: Secondary | ICD-10-CM

## 2018-03-25 DIAGNOSIS — E559 Vitamin D deficiency, unspecified: Secondary | ICD-10-CM

## 2018-03-25 DIAGNOSIS — N184 Chronic kidney disease, stage 4 (severe): Secondary | ICD-10-CM | POA: Diagnosis not present

## 2018-03-25 DIAGNOSIS — I10 Essential (primary) hypertension: Secondary | ICD-10-CM

## 2018-03-25 MED ORDER — VITAMIN D (ERGOCALCIFEROL) 1.25 MG (50000 UNIT) PO CAPS: 50000.0000 [IU] | ORAL_CAPSULE | ORAL | 0 refills | Status: DC

## 2018-03-26 ENCOUNTER — Other Ambulatory Visit: Payer: Self-pay

## 2018-03-26 MED ORDER — GLUCOSE BLOOD VI STRP: ORAL_STRIP | 12 refills | Status: DC

## 2018-03-26 MED ORDER — ONETOUCH ULTRASOFT LANCETS MISC: 12 refills | Status: AC

## 2018-03-26 MED ORDER — ONETOUCH ULTRA 2 W/DEVICE KIT: 1.0000 | PACK | Freq: Every day | 0 refills | Status: DC

## 2018-03-26 NOTE — Telephone Encounter (Signed)
I called LVM with patient that I was sending in prescription for one touch ultra meter, lancets & strips.

## 2018-03-26 NOTE — Progress Notes (Signed)
Office: [phone]  /  Fax: [phone]   HPI:   Chief Complaint: OBESITY Rachel Holmes is here to discuss her progress with her obesity treatment plan. She is on the Category 2 plan and is following her eating plan approximately 75 % of the time. She states she is walking 4,000-5,000 steps 5 days per week. Rachel Holmes did well with weight loss on Category 2 plan. She struggled to eat all her dinner. Trying to walk some for exercise.  Her weight is 296 lb (134.3 kg) today and has had a weight loss of 5 pounds over a period of 2 weeks since her last visit. She has lost 5 lbs since starting treatment with [name].  Hypertension Rachel Holmes is a 57 y.o. female with hypertension. Rachel Holmes blood pressure is uncontrolled, on multiple medications with no improvement. She denies history of sleep apnea but no testing done. She is working weight loss to help control her blood pressure with the goal of decreasing her risk of heart attack and stroke. Rachel Holmes's blood pressure is not currently controlled.  Chronic Renal Impairment, Stage 4  Rachel Holmes's GFR below 30 now, worsening renal failure. She denies Nephrology consult yet, now has anemia of chronic disease. She is not on angiotensin converting enzyme or angiotensin II receptor blockers for unknown reason. Diabetes mellitus uncontrolled.  Vitamin D Deficiency Rachel Holmes has a new diagnosis of vitamin D deficiency. She is not on Vit D, she notes fatigue and denies nausea, vomiting or muscle weakness.  ALLERGIES: Allergies  Allergen Reactions  . Statins Shortness Of Breath    Wheezing, short of breath  . Codeine Nausea Only  . Ibuprofen Other (See Comments)    Reaction:  Raises pts BP  . Penicillins Hives and Other (See Comments)    Has patient had a PCN reaction causing immediate rash, facial/tongue/throat swelling, SOB or lightheadedness with hypotension: No Has patient had a PCN reaction causing severe rash involving mucus membranes or skin necrosis: No Has patient  had a PCN reaction that required hospitalization No Has patient had a PCN reaction occurring within the last 10 years: No If all of the above answers are "NO", then may proceed with Cephalosporin use.  [name] Percocet [Oxycodone-Acetaminophen] Nausea Only  . Tramadol Nausea Only  . Vicodin [Hydrocodone-Acetaminophen] Nausea Only    MEDICATIONS: Current Outpatient Medications on File Prior to Visit  Medication Sig Dispense Refill  . albuterol (PROVENTIL HFA;VENTOLIN HFA) 108 (90 Base) MCG/ACT inhaler Inhale 1-2 puffs into the lungs every 6 (six) hours as needed for wheezing or shortness of breath.    [name] amLODipine (NORVASC) 10 MG tablet Take 10 mg by mouth daily.    [name] aspirin EC 325 MG tablet Take 325 mg by mouth daily.    . cloNIDine (CATAPRES) 0.3 MG tablet Take 0.3 mg by mouth 2 (two) times daily.    . Continuous Blood Gluc Sensor (FREESTYLE LIBRE 14 DAY SENSOR) MISC 1 Device by Does not apply route every 14 (fourteen) days. 6 each 11  . ferrous sulfate 325 (65 FE) MG tablet Take 325 mg by mouth 3 (three) times daily with meals.    . furosemide (LASIX) 20 MG tablet Take 20 mg by mouth daily.    [name] gabapentin (NEURONTIN) 600 MG tablet Take 600 mg by mouth at bedtime.    [name] glucose blood (ONETOUCH VERIO) test strip Used to check blood sugars 4 times daily. DX code E11.22. 100 each 12  . Insulin Detemir (LEVEMIR FLEXTOUCH) 100 UNIT/ML Pen Inject 8  Units into the skin at bedtime. 15 mL 11  . isosorbide mononitrate (IMDUR) 120 MG 24 hr tablet Take 120 mg by mouth daily.    . meloxicam (MOBIC) 15 MG tablet Take 15 mg by mouth at bedtime.    . Multiple Vitamin (MULTIVITAMIN WITH MINERALS) TABS tablet Take 1 tablet by mouth daily.    [name] omeprazole (PRILOSEC) 40 MG capsule Take 40 mg by mouth 2 (two) times daily.    . ranitidine (ZANTAC) 300 MG tablet Take 300 mg by mouth at bedtime.    [name] spironolactone (ALDACTONE) 50 MG tablet Take 50 mg by mouth daily.     No current facility-administered medications  on file prior to visit.     PAST MEDICAL HISTORY: Past Medical History:  Diagnosis Date  . Allergic rhinitis   . Anemia   . Anxiety   . Back pain   . Bradycardia   . Breast mass    Patient can no longer palpate specific masses but showed tech general area of concern  . CHF (congestive heart failure) ([place])   . CKD (chronic kidney disease)    STAGE 3  . Constipation   . Diabetes mellitus without complication ([place])   . Dyspnea   . GERD (gastroesophageal reflux disease)   . HLD (hyperlipidemia)   . Hyperparathyroidism ([place])   . Hypertension   . Joint pain   . Leg edema   . Legally blind in left eye, as defined in [place]   . Lymphedema   . Onychomycosis     PAST SURGICAL HISTORY: Past Surgical History:  Procedure Laterality Date  . ABDOMINAL HYSTERECTOMY    . BREAST BIOPSY Left 2014   FNA 12:00 position - Negative  . EYE SURGERY      SOCIAL HISTORY: Social History   Tobacco Use  . Smoking status: Never Smoker  . Smokeless tobacco: Never Used  Substance Use Topics  . Alcohol use: No  . Drug use: Never    FAMILY HISTORY: Family History  Problem Relation Age of Onset  . Breast cancer Sister 41  . Diabetes Sister   . Diabetes Mother   . Hypertension Mother   . Hyperlipidemia Mother   . Eating disorder Mother   . Obesity Mother     ROS: Review of Systems  Constitutional: Positive for malaise/fatigue and weight loss.  Gastrointestinal: Negative for nausea and vomiting.  Musculoskeletal:       Negative muscle weakness    PHYSICAL EXAM: Blood pressure (!) 180/73, pulse (!) 54, temperature 97.7 F (36.5 C), temperature source Oral, height 5\' 6"  (1.676 m), weight 296 lb (134.3 kg), SpO2 99 %. Body mass index is 47.78 kg/m. Physical Exam  Constitutional: She is oriented to person, place, and time. She appears well-developed and well-nourished.  Cardiovascular: Normal rate.  Pulmonary/Chest: Effort normal.  Musculoskeletal: Normal range of motion.    Neurological: She is oriented to person, place, and time.  Skin: Skin is warm and dry.  Psychiatric: She has a normal mood and affect. Her behavior is normal.  Vitals reviewed.   RECENT LABS AND TESTS: BMET    Component Value Date/Time   NA 143 03/11/2018 1002   NA 137 11/21/2013 0627   K 4.3 03/11/2018 1002   K 4.6 11/21/2013 0627   CL 107 (H) 03/11/2018 1002   CL 107 11/21/2013 0627   CO2 22 03/11/2018 1002   CO2 24 11/21/2013 0627   GLUCOSE 124 (H) 03/11/2018 1002   GLUCOSE 86 02/03/2017  1119   GLUCOSE 95 11/21/2013 0627   BUN 30 (H) 03/11/2018 1002   BUN 34 (H) 11/21/2013 0627   CREATININE 2.19 (H) 03/11/2018 1002   CREATININE 1.80 (H) 09/09/2014 1220   CALCIUM 8.7 03/11/2018 1002   CALCIUM 8.8 09/09/2014 1220   GFRNONAA 24 (L) 03/11/2018 1002   GFRNONAA 31 (L) 09/09/2014 1220   GFRNONAA 26 (L) 11/21/2013 0627   GFRAA 28 (L) 03/11/2018 1002   GFRAA 38 (L) 09/09/2014 1220   GFRAA 30 (L) 11/21/2013 0627   Lab Results  Component Value Date   HGBA1C 7.2 02/13/2018   HGBA1C 7.2 12/05/2017   HGBA1C 7.3 10/02/2017   HGBA1C 7.7 (H) 11/20/2013   HGBA1C 9.0 (H) 03/30/2013   No results found for: INSULIN CBC    Component Value Date/Time   WBC 6.9 03/11/2018 1002   WBC 7.8 02/03/2017 1119   RBC 3.47 (L) 03/11/2018 1002   RBC 4.26 02/03/2017 1119   HGB 10.0 (L) 03/11/2018 1002   HCT 30.6 (L) 03/11/2018 1002   PLT 230 02/03/2017 1119   PLT 210 09/09/2014 1220   MCV 88 03/11/2018 1002   MCV 87 09/09/2014 1220   MCH 28.8 03/11/2018 1002   MCH 28.2 02/03/2017 1119   MCHC 32.7 03/11/2018 1002   MCHC 32.4 02/03/2017 1119   RDW 14.4 03/11/2018 1002   RDW 15.1 (H) 09/09/2014 1220   LYMPHSABS 2.0 03/11/2018 1002   LYMPHSABS 2.0 09/09/2014 1220   MONOABS 0.4 02/03/2017 1119   MONOABS 0.5 09/09/2014 1220   EOSABS 0.1 03/11/2018 1002   EOSABS 0.4 09/09/2014 1220   BASOSABS 0.0 03/11/2018 1002   BASOSABS 0.1 09/09/2014 1220   Iron/TIBC/Ferritin/ %Sat    Component  Value Date/Time   IRON 39 (L) 10/07/2014 1527   TIBC 202 (L) 10/07/2014 1527   FERRITIN 176 08/04/2014 1527   IRONPCTSAT 19 10/07/2014 1527   Lipid Panel     Component Value Date/Time   CHOL 209 (H) 03/11/2018 1002   CHOL 157 11/20/2013 0954   TRIG 107 03/11/2018 1002   TRIG 114 11/20/2013 0954   HDL 67 03/11/2018 1002   HDL 38 (L) 11/20/2013 0954   VLDL 23 11/20/2013 0954   LDLCALC 121 (H) 03/11/2018 1002   LDLCALC 96 11/20/2013 0954   Hepatic Function Panel     Component Value Date/Time   PROT 6.7 03/11/2018 1002   PROT 7.9 11/19/2013 0934   ALBUMIN 3.7 03/11/2018 1002   ALBUMIN 3.2 (L) 11/19/2013 0934   AST 17 03/11/2018 1002   AST 19 11/19/2013 0934   ALT 14 03/11/2018 1002   ALT 20 11/19/2013 0934   ALKPHOS 143 (H) 03/11/2018 1002   ALKPHOS 95 11/19/2013 0934   BILITOT 0.3 03/11/2018 1002   BILITOT 0.3 11/19/2013 0934      Component Value Date/Time   TSH 1.170 03/11/2018 1002   TSH 1.42 11/20/2013 0954  Results for KAMILLE, TOOMEY (MRN [id number]) as of 03/26/2018 11:29  Ref. Range 03/11/2018 10:02  Vitamin D, 25-Hydroxy Latest Ref Range: 30.0 - 100.0 ng/mL 21.4 (L)    ASSESSMENT AND PLAN: Essential hypertension - Plan: Ambulatory referral to Sleep Studies  Chronic renal impairment, stage 4 (severe) ([place]) - Plan: Ambulatory referral to Nephrology  Vitamin D deficiency - Plan: Vitamin D, Ergocalciferol, (DRISDOL) 50000 units CAPS capsule  Class 3 severe obesity with serious comorbidity and body mass index (BMI) of 45.0 to 49.9 in adult, unspecified obesity type ([place])  PLAN:  Hypertension We discussed sodium  restriction, working on healthy weight loss, and a regular exercise program as the means to achieve improved blood pressure control. Isra agreed with this plan and agreed to follow up as directed. We will continue to monitor her blood pressure as well as her progress with the above lifestyle modifications. She will watch for signs of hypotension as she  continues her lifestyle modifications. We will send referral to Dr. Brett Fairy at [facility] for sleep study. Rachel Holmes agrees to follow up with our clinic in 3 weeks.  Chronic Renal Impairment, Stage 4 We will send a referral to Nephrology for evaluation and treatment. She will work on diet, weight loss, glucose, and blood pressure control in the meantime. Flavia agrees to follow up with our clinic in 3 weeks.  Vitamin D Deficiency Rachel Holmes was informed that low vitamin D levels contributes to fatigue and are associated with obesity, breast, and colon cancer. Rachel Holmes agrees to start prescription Vit D @50 ,000 IU every week #4 with no refills. She will follow up for routine testing of vitamin D, at least 2-3 times per year. She was informed of the risk of over-replacement of vitamin D and agrees to not increase her dose unless she discusses this with [name] first. We will recheck labs in 3 months and Rachel Holmes agrees to follow up with our clinic in 3 weeks.  Obesity Rachel Holmes is currently in the action stage of change. As such, her goal is to continue with weight loss efforts She has agreed to follow the Category 2 plan Rachel Holmes has been instructed to work up to a goal of 150 minutes of combined cardio and strengthening exercise per week for weight loss and overall health benefits. We discussed the following Behavioral Modification Strategies today: increasing lean protein intake, decreasing simple carbohydrates, and increase H20 intake    Rachel Holmes has agreed to follow up with our clinic in 3 weeks. She was informed of the importance of frequent follow up visits to maximize her success with intensive lifestyle modifications for her multiple health conditions.   OBESITY BEHAVIORAL INTERVENTION VISIT  Today's visit was # 2 out of 22.  Starting weight: 301 lbs Starting date: 03/11/18 Today's weight : 296 lbs  Today's date: 03/25/2018 Total lbs lost to date: 5 (Patients must lose 7 lbs in the first 6 months to continue with  counseling)   ASK: We discussed the diagnosis of obesity with Rachel Holmes today and Rachel Holmes agreed to give [name] permission to discuss obesity behavioral modification therapy today.  ASSESS: Rachel Holmes has the diagnosis of obesity and her BMI today is 75.8 Rachel Holmes is in the action stage of change   ADVISE: Rachel Holmes was educated on the multiple health risks of obesity as well as the benefit of weight loss to improve her health. She was advised of the need for long term treatment and the importance of lifestyle modifications.  AGREE: Multiple dietary modification options and treatment options were discussed and  Rachel Holmes agreed to the above obesity treatment plan.  I, Rachel Holmes, am acting as transcriptionist for Dennard Nip, MD  I have reviewed the above documentation for accuracy and completeness, and I agree with the above. -Dennard Nip, MD

## 2018-03-27 ENCOUNTER — Encounter (INDEPENDENT_AMBULATORY_CARE_PROVIDER_SITE_OTHER): Payer: Medicare Other | Admitting: Ophthalmology

## 2018-03-31 ENCOUNTER — Ambulatory Visit (INDEPENDENT_AMBULATORY_CARE_PROVIDER_SITE_OTHER): Payer: Medicare HMO | Admitting: Family Medicine

## 2018-04-10 ENCOUNTER — Ambulatory Visit: Payer: Medicare HMO | Admitting: Podiatry

## 2018-04-15 ENCOUNTER — Other Ambulatory Visit (INDEPENDENT_AMBULATORY_CARE_PROVIDER_SITE_OTHER): Payer: Self-pay | Admitting: Family Medicine

## 2018-04-15 DIAGNOSIS — E559 Vitamin D deficiency, unspecified: Secondary | ICD-10-CM

## 2018-04-20 ENCOUNTER — Ambulatory Visit (INDEPENDENT_AMBULATORY_CARE_PROVIDER_SITE_OTHER): Payer: Medicare HMO | Admitting: Family Medicine

## 2018-04-20 VITALS — BP 171/68 | HR 49 | Temp 98.1°F | Ht 66.0 in | Wt 293.0 lb

## 2018-04-20 DIAGNOSIS — E559 Vitamin D deficiency, unspecified: Secondary | ICD-10-CM

## 2018-04-20 DIAGNOSIS — Z6841 Body Mass Index (BMI) 40.0 and over, adult: Secondary | ICD-10-CM

## 2018-04-20 MED ORDER — VITAMIN D (ERGOCALCIFEROL) 1.25 MG (50000 UNIT) PO CAPS: 50000.0000 [IU] | ORAL_CAPSULE | ORAL | 0 refills | Status: DC

## 2018-04-21 NOTE — Progress Notes (Signed)
 Office: [phone]  /  Fax: [phone]   HPI:   Chief Complaint: OBESITY Rachel Holmes is here to discuss her progress with her obesity treatment plan. She is on the Category 2 plan and is following her eating plan approximately 70 % of the time. She states she is walking 3,000 steps per day 5 times per week. Rachel Holmes continues to lose weight, but she is struggling to follow her plan closely. She especially struggled with dinner meals. Her weight is 293 lb (132.9 kg) today and has had a weight loss of 3 pounds over a period of 3 to 4 weeks since her last visit. She has lost 8 lbs since starting treatment with us.  Vitamin D deficiency Rachel Holmes has a diagnosis of vitamin D deficiency. [name] is stable on vit D, but she is not yet at goal. Rachel Holmes denies nausea, vomiting or muscle weakness.  ALLERGIES: Allergies  Allergen Reactions  . Statins Shortness Of Breath    Wheezing, short of breath  . Codeine Nausea Only  . Ibuprofen Other (See Comments)    Reaction:  Raises pts BP  . Penicillins Hives and Other (See Comments)    Has patient had a PCN reaction causing immediate rash, facial/tongue/throat swelling, SOB or lightheadedness with hypotension: No Has patient had a PCN reaction causing severe rash involving mucus membranes or skin necrosis: No Has patient had a PCN reaction that required hospitalization No Has patient had a PCN reaction occurring within the last 10 years: No If all of the above answers are "NO", then may proceed with Cephalosporin use.  . Percocet [Oxycodone-Acetaminophen] Nausea Only  . Tramadol Nausea Only  . Vicodin [Hydrocodone-Acetaminophen] Nausea Only    MEDICATIONS: Current Outpatient Medications on File Prior to Visit  Medication Sig Dispense Refill  . albuterol (PROVENTIL HFA;VENTOLIN HFA) 108 (90 Base) MCG/ACT inhaler Inhale 1-2 puffs into the lungs every 6 (six) hours as needed for wheezing or shortness of breath.    . amLODipine (NORVASC) 10 MG tablet Take 10  mg by mouth daily.    . aspirin EC 325 MG tablet Take 325 mg by mouth daily.    . Blood Glucose Monitoring Suppl (ONE TOUCH ULTRA 2) w/Device KIT 1 Device by Does not apply route daily. 1 each 0  . cloNIDine (CATAPRES) 0.3 MG tablet Take 0.3 mg by mouth 2 (two) times daily.    . Continuous Blood Gluc Sensor (FREESTYLE LIBRE 14 DAY SENSOR) MISC 1 Device by Does not apply route every 14 (fourteen) days. 6 each 11  . ferrous sulfate 325 (65 FE) MG tablet Take 325 mg by mouth 3 (three) times daily with meals.    . furosemide (LASIX) 20 MG tablet Take 20 mg by mouth daily.    . gabapentin (NEURONTIN) 600 MG tablet Take 600 mg by mouth at bedtime.    . glucose blood (ONE TOUCH ULTRA TEST) test strip Used to check blood sugars four times daily. 150 each 12  . Insulin Detemir (LEVEMIR FLEXTOUCH) 100 UNIT/ML Pen Inject 8 Units into the skin at bedtime. 15 mL 11  . isosorbide mononitrate (IMDUR) 120 MG 24 hr tablet Take 120 mg by mouth daily.    . Lancets (ONETOUCH ULTRASOFT) lancets Used to check blood sugars four times daily. 200 each 12  . meloxicam (MOBIC) 15 MG tablet Take 15 mg by mouth at bedtime.    . Multiple Vitamin (MULTIVITAMIN WITH MINERALS) TABS tablet Take 1 tablet by mouth daily.    . omeprazole (PRILOSEC) 40   MG capsule Take 40 mg by mouth 2 (two) times daily.    . ranitidine (ZANTAC) 300 MG tablet Take 300 mg by mouth at bedtime.    [name] spironolactone (ALDACTONE) 50 MG tablet Take 50 mg by mouth daily.     No current facility-administered medications on file prior to visit.     PAST MEDICAL HISTORY: Past Medical History:  Diagnosis Date  . Allergic rhinitis   . Anemia   . Anxiety   . Back pain   . Bradycardia   . Breast mass    Patient can no longer palpate specific masses but showed tech general area of concern  . CHF (congestive heart failure) ([place])   . CKD (chronic kidney disease)    STAGE 3  . Constipation   . Diabetes mellitus without complication ([place])   . Dyspnea   .  GERD (gastroesophageal reflux disease)   . HLD (hyperlipidemia)   . Hyperparathyroidism ([place])   . Hypertension   . Joint pain   . Leg edema   . Legally blind in left eye, as defined in [place]   . Lymphedema   . Onychomycosis     PAST SURGICAL HISTORY: Past Surgical History:  Procedure Laterality Date  . ABDOMINAL HYSTERECTOMY    . BREAST BIOPSY Left 2014   FNA 12:00 position - Negative  . EYE SURGERY      SOCIAL HISTORY: Social History   Tobacco Use  . Smoking status: Never Smoker  . Smokeless tobacco: Never Used  Substance Use Topics  . Alcohol use: No  . Drug use: Never    FAMILY HISTORY: Family History  Problem Relation Age of Onset  . Breast cancer Sister 40  . Diabetes Sister   . Diabetes Mother   . Hypertension Mother   . Hyperlipidemia Mother   . Eating disorder Mother   . Obesity Mother     ROS: Review of Systems  Constitutional: Positive for weight loss.  Gastrointestinal: Negative for nausea and vomiting.  Musculoskeletal:       Negative for muscle weakness    PHYSICAL EXAM: Blood pressure (!) 171/68, pulse (!) 49, temperature 98.1 F (36.7 C), temperature source Oral, height 5' 6" (1.676 m), weight 293 lb (132.9 kg), SpO2 98 %. Body mass index is 47.29 kg/m. Physical Exam  Constitutional: She is oriented to person, place, and time. She appears well-developed and well-nourished.  Cardiovascular: Normal rate.  Pulmonary/Chest: Effort normal.  Musculoskeletal: Normal range of motion.  Neurological: She is oriented to person, place, and time.  Skin: Skin is warm and dry.  Psychiatric: She has a normal mood and affect. Her behavior is normal.  Vitals reviewed.   RECENT LABS AND TESTS: BMET    Component Value Date/Time   NA 143 03/11/2018 1002   NA 137 11/21/2013 0627   K 4.3 03/11/2018 1002   K 4.6 11/21/2013 0627   CL 107 (H) 03/11/2018 1002   CL 107 11/21/2013 0627   CO2 22 03/11/2018 1002   CO2 24 11/21/2013 0627   GLUCOSE 124 (H)  03/11/2018 1002   GLUCOSE 86 02/03/2017 1119   GLUCOSE 95 11/21/2013 0627   BUN 30 (H) 03/11/2018 1002   BUN 34 (H) 11/21/2013 0627   CREATININE 2.19 (H) 03/11/2018 1002   CREATININE 1.80 (H) 09/09/2014 1220   CALCIUM 8.7 03/11/2018 1002   CALCIUM 8.8 09/09/2014 1220   GFRNONAA 24 (L) 03/11/2018 1002   GFRNONAA 31 (L) 09/09/2014 1220   GFRNONAA 26 (L) 11/21/2013  0627   GFRAA 28 (L) 03/11/2018 1002   GFRAA 38 (L) 09/09/2014 1220   GFRAA 30 (L) 11/21/2013 0627   Lab Results  Component Value Date   HGBA1C 7.2 02/13/2018   HGBA1C 7.2 12/05/2017   HGBA1C 7.3 10/02/2017   HGBA1C 7.7 (H) 11/20/2013   HGBA1C 9.0 (H) 03/30/2013   No results found for: INSULIN CBC    Component Value Date/Time   WBC 6.9 03/11/2018 1002   WBC 7.8 02/03/2017 1119   RBC 3.47 (L) 03/11/2018 1002   RBC 4.26 02/03/2017 1119   HGB 10.0 (L) 03/11/2018 1002   HCT 30.6 (L) 03/11/2018 1002   PLT 230 02/03/2017 1119   PLT 210 09/09/2014 1220   MCV 88 03/11/2018 1002   MCV 87 09/09/2014 1220   MCH 28.8 03/11/2018 1002   MCH 28.2 02/03/2017 1119   MCHC 32.7 03/11/2018 1002   MCHC 32.4 02/03/2017 1119   RDW 14.4 03/11/2018 1002   RDW 15.1 (H) 09/09/2014 1220   LYMPHSABS 2.0 03/11/2018 1002   LYMPHSABS 2.0 09/09/2014 1220   MONOABS 0.4 02/03/2017 1119   MONOABS 0.5 09/09/2014 1220   EOSABS 0.1 03/11/2018 1002   EOSABS 0.4 09/09/2014 1220   BASOSABS 0.0 03/11/2018 1002   BASOSABS 0.1 09/09/2014 1220   Iron/TIBC/Ferritin/ %Sat    Component Value Date/Time   IRON 39 (L) 10/07/2014 1527   TIBC 202 (L) 10/07/2014 1527   FERRITIN 176 08/04/2014 1527   IRONPCTSAT 19 10/07/2014 1527   Lipid Panel     Component Value Date/Time   CHOL 209 (H) 03/11/2018 1002   CHOL 157 11/20/2013 0954   TRIG 107 03/11/2018 1002   TRIG 114 11/20/2013 0954   HDL 67 03/11/2018 1002   HDL 38 (L) 11/20/2013 0954   VLDL 23 11/20/2013 0954   LDLCALC 121 (H) 03/11/2018 1002   LDLCALC 96 11/20/2013 0954   Hepatic Function  Panel     Component Value Date/Time   PROT 6.7 03/11/2018 1002   PROT 7.9 11/19/2013 0934   ALBUMIN 3.7 03/11/2018 1002   ALBUMIN 3.2 (L) 11/19/2013 0934   AST 17 03/11/2018 1002   AST 19 11/19/2013 0934   ALT 14 03/11/2018 1002   ALT 20 11/19/2013 0934   ALKPHOS 143 (H) 03/11/2018 1002   ALKPHOS 95 11/19/2013 0934   BILITOT 0.3 03/11/2018 1002   BILITOT 0.3 11/19/2013 0934      Component Value Date/Time   TSH 1.170 03/11/2018 1002   TSH 1.42 11/20/2013 0954   Results for Desire, Annalea J (MRN [id number]) as of 04/21/2018 09:28  Ref. Range 03/11/2018 10:02  Vitamin D, 25-Hydroxy Latest Ref Range: 30.0 - 100.0 ng/mL 21.4 (L)   ASSESSMENT AND PLAN: Vitamin D deficiency - Plan: Vitamin D, Ergocalciferol, (DRISDOL) 50000 units CAPS capsule  Class 3 severe obesity with serious comorbidity and body mass index (BMI) of 45.0 to 49.9 in adult, unspecified obesity type ([facility])  PLAN:  Vitamin D Deficiency Suellyn was informed that low vitamin D levels contributes to fatigue and are associated with obesity, breast, and colon cancer. She agrees to continue to take prescription Vit D @50,000 IU every week #4 with no refills and will follow up for routine testing of vitamin D, at least 2-3 times per year. She was informed of the risk of over-replacement of vitamin D and agrees to not increase her dose unless she discusses this with us first. Jahmia agrees to follow up as directed.  Obesity Dejha is currently in the   action stage of change. As such, her goal is to continue with weight loss efforts She has agreed to keep a food journal with 350 to 500 calories and 35+ grams of protein at supper daily and follow the Category 2 plan Karlie has been instructed to work up to a goal of 150 minutes of combined cardio and strengthening exercise per week for weight loss and overall health benefits. We discussed the following Behavioral Modification Strategies today: no skipping meals, increase H2O intake,  increasing lean protein intake, decreasing simple carbohydrates  and work on meal planning and easy cooking plans  Lequisha has agreed to follow up with our clinic in 2 to 3 weeks. She was informed of the importance of frequent follow up visits to maximize her success with intensive lifestyle modifications for her multiple health conditions.   OBESITY BEHAVIORAL INTERVENTION VISIT  Today's visit was # 3 out of 22.  Starting weight: 301 lbs Starting date: 03/11/18 Today's weight : 293 lbs  Today's date: 04/20/2018 Total lbs lost to date: 8 (Patients must lose 7 lbs in the first 6 months to continue with counseling)   ASK: We discussed the diagnosis of obesity with Rondel Jumbo today and Arabel agreed to give [name] permission to discuss obesity behavioral modification therapy today.  ASSESS: Alcie has the diagnosis of obesity and her BMI today is 47.31 Monda is in the action stage of change   ADVISE: Tajanae was educated on the multiple health risks of obesity as well as the benefit of weight loss to improve her health. She was advised of the need for long term treatment and the importance of lifestyle modifications.  AGREE: Multiple dietary modification options and treatment options were discussed and  Carter agreed to the above obesity treatment plan.  I, Doreene Nest, am acting as transcriptionist for Dennard Nip, MD  I have reviewed the above documentation for accuracy and completeness, and I agree with the above. -Dennard Nip, MD

## 2018-05-14 ENCOUNTER — Ambulatory Visit (INDEPENDENT_AMBULATORY_CARE_PROVIDER_SITE_OTHER): Payer: Medicare HMO | Admitting: Family Medicine

## 2018-05-14 VITALS — BP 182/69 | HR 53 | Temp 97.6°F | Ht 66.0 in | Wt 288.0 lb

## 2018-05-14 DIAGNOSIS — E559 Vitamin D deficiency, unspecified: Secondary | ICD-10-CM | POA: Diagnosis not present

## 2018-05-14 DIAGNOSIS — I10 Essential (primary) hypertension: Secondary | ICD-10-CM

## 2018-05-14 DIAGNOSIS — Z6841 Body Mass Index (BMI) 40.0 and over, adult: Secondary | ICD-10-CM | POA: Diagnosis not present

## 2018-05-14 DIAGNOSIS — Z9189 Other specified personal risk factors, not elsewhere classified: Secondary | ICD-10-CM

## 2018-05-14 MED ORDER — VITAMIN D (ERGOCALCIFEROL) 1.25 MG (50000 UNIT) PO CAPS: 50000.0000 [IU] | ORAL_CAPSULE | ORAL | 0 refills | Status: DC

## 2018-05-18 NOTE — Progress Notes (Signed)
Office: [phone]  /  Fax: [phone]   HPI:   Chief Complaint: OBESITY Rachel Holmes is here to discuss her progress with her obesity treatment plan. She is on the  keep a food journal with 350 to 500 calories and 35+ grams of protein at supper daily and the Category 2 plan and is following her eating plan approximately 25 % of the time. She states she is exercising 0 minutes 0 times per week. Rachel Holmes continues to do well with weight loss, but she hasn't been eating all her food, and she increased snacking while planning for her son's weeding. Her vegetables and protein have decreased, but she is ready to get back on track. Her weight is 288 lb (130.6 kg) today and has had a weight loss of 5 pounds over a period of 3 weeks since her last visit. She has lost 13 lbs since starting treatment with [name].  Vitamin D deficiency Rachel Holmes has a diagnosis of vitamin D deficiency. Rachel Holmes is stable on vit D, but she is not yet at goal. She denies nausea, vomiting or muscle weakness.  Hypertension Rachel Holmes is a 57 y.o. female with hypertension. [place] states she was started on another blood pressure medication by her PCP, but her blood pressure is still very uncontrolled. She has a sleep consult scheduled for next month. Rachel Holmes denies chest pain or shortness of breath on exertion. She is working weight loss to help control her blood pressure with the goal of decreasing her risk of heart attack and stroke. Rachel Holmes blood pressure is currently controlled.  At risk for cardiovascular disease Rachel Holmes is at a higher than average risk for cardiovascular disease due to obesity and hypertension. She currently denies any chest pain.  ALLERGIES: Allergies  Allergen Reactions  . Statins Shortness Of Breath    Wheezing, short of breath  . Codeine Nausea Only  . Ibuprofen Other (See Comments)    Reaction:  Raises pts BP  . Penicillins Hives and Other (See Comments)    Has patient had a PCN reaction causing  immediate rash, facial/tongue/throat swelling, SOB or lightheadedness with hypotension: No Has patient had a PCN reaction causing severe rash involving mucus membranes or skin necrosis: No Has patient had a PCN reaction that required hospitalization No Has patient had a PCN reaction occurring within the last 10 years: No If all of the above answers are "NO", then may proceed with Cephalosporin use.  [name] Percocet [Oxycodone-Acetaminophen] Nausea Only  . Tramadol Nausea Only  . Vicodin [Hydrocodone-Acetaminophen] Nausea Only    MEDICATIONS: Current Outpatient Medications on File Prior to Visit  Medication Sig Dispense Refill  . albuterol (PROVENTIL HFA;VENTOLIN HFA) 108 (90 Base) MCG/ACT inhaler Inhale 1-2 puffs into the lungs every 6 (six) hours as needed for wheezing or shortness of breath.    [name] amLODipine (NORVASC) 10 MG tablet Take 10 mg by mouth daily.    [name] aspirin EC 325 MG tablet Take 325 mg by mouth daily.    . Blood Glucose Monitoring Suppl (ONE TOUCH ULTRA 2) w/Device KIT 1 Device by Does not apply route daily. 1 each 0  . cloNIDine (CATAPRES) 0.3 MG tablet Take 0.3 mg by mouth 2 (two) times daily.    . Continuous Blood Gluc Sensor (FREESTYLE LIBRE 14 DAY SENSOR) MISC 1 Device by Does not apply route every 14 (fourteen) days. 6 each 11  . ferrous sulfate 325 (65 FE) MG tablet Take 325 mg by mouth 3 (three) times daily with  meals.    . furosemide (LASIX) 20 MG tablet Take 20 mg by mouth daily.    [name] gabapentin (NEURONTIN) 600 MG tablet Take 600 mg by mouth at bedtime.    [name] glucose blood (ONE TOUCH ULTRA TEST) test strip Used to check blood sugars four times daily. 150 each 12  . Insulin Detemir (LEVEMIR FLEXTOUCH) 100 UNIT/ML Pen Inject 8 Units into the skin at bedtime. 15 mL 11  . isosorbide mononitrate (IMDUR) 120 MG 24 hr tablet Take 120 mg by mouth daily.    . Lancets (ONETOUCH ULTRASOFT) lancets Used to check blood sugars four times daily. 200 each 12  . meloxicam (MOBIC) 15 MG  tablet Take 15 mg by mouth at bedtime.    . Multiple Vitamin (MULTIVITAMIN WITH MINERALS) TABS tablet Take 1 tablet by mouth daily.    [name] omeprazole (PRILOSEC) 40 MG capsule Take 40 mg by mouth 2 (two) times daily.    . ranitidine (ZANTAC) 300 MG tablet Take 300 mg by mouth at bedtime.    [name] spironolactone (ALDACTONE) 50 MG tablet Take 50 mg by mouth daily.     No current facility-administered medications on file prior to visit.     PAST MEDICAL HISTORY: Past Medical History:  Diagnosis Date  . Allergic rhinitis   . Anemia   . Anxiety   . Back pain   . Bradycardia   . Breast mass    Patient can no longer palpate specific masses but showed tech general area of concern  . CHF (congestive heart failure) ([place])   . CKD (chronic kidney disease)    STAGE 3  . Constipation   . Diabetes mellitus without complication ([place])   . Dyspnea   . GERD (gastroesophageal reflux disease)   . HLD (hyperlipidemia)   . Hyperparathyroidism ([place])   . Hypertension   . Joint pain   . Leg edema   . Legally blind in left eye, as defined in [place]   . Lymphedema   . Onychomycosis     PAST SURGICAL HISTORY: Past Surgical History:  Procedure Laterality Date  . ABDOMINAL HYSTERECTOMY    . BREAST BIOPSY Left 2014   FNA 12:00 position - Negative  . EYE SURGERY      SOCIAL HISTORY: Social History   Tobacco Use  . Smoking status: Never Smoker  . Smokeless tobacco: Never Used  Substance Use Topics  . Alcohol use: No  . Drug use: Never    FAMILY HISTORY: Family History  Problem Relation Age of Onset  . Breast cancer Sister 56  . Diabetes Sister   . Diabetes Mother   . Hypertension Mother   . Hyperlipidemia Mother   . Eating disorder Mother   . Obesity Mother     ROS: Review of Systems  Constitutional: Positive for weight loss.  Respiratory: Negative for shortness of breath (on exertion).   Cardiovascular: Negative for chest pain.  Gastrointestinal: Negative for nausea and vomiting.    Musculoskeletal:       Negative for muscle weakness    PHYSICAL EXAM: Blood pressure (!) 182/69, pulse (!) 53, temperature 97.6 F (36.4 C), temperature source Oral, height _0  (1.676 m), weight 288 lb (130.6 kg), SpO2 99 %. Body mass index is 46.48 kg/m. Physical Exam  Constitutional: She is oriented to person, place, and time. She appears well-developed and well-nourished.  Cardiovascular: Normal rate.  Pulmonary/Chest: Effort normal.  Musculoskeletal: Normal range of motion.  Neurological: She is oriented to person, place, and  time.  Skin: Skin is warm and dry.  Psychiatric: She has a normal mood and affect. Her behavior is normal.  Vitals reviewed.   RECENT LABS AND TESTS: BMET    Component Value Date/Time   NA 143 03/11/2018 1002   NA 137 11/21/2013 0627   K 4.3 03/11/2018 1002   K 4.6 11/21/2013 0627   CL 107 (H) 03/11/2018 1002   CL 107 11/21/2013 0627   CO2 22 03/11/2018 1002   CO2 24 11/21/2013 0627   GLUCOSE 124 (H) 03/11/2018 1002   GLUCOSE 86 02/03/2017 1119   GLUCOSE 95 11/21/2013 0627   BUN 30 (H) 03/11/2018 1002   BUN 34 (H) 11/21/2013 0627   CREATININE 2.19 (H) 03/11/2018 1002   CREATININE 1.80 (H) 09/09/2014 1220   CALCIUM 8.7 03/11/2018 1002   CALCIUM 8.8 09/09/2014 1220   GFRNONAA 24 (L) 03/11/2018 1002   GFRNONAA 31 (L) 09/09/2014 1220   GFRNONAA 26 (L) 11/21/2013 0627   GFRAA 28 (L) 03/11/2018 1002   GFRAA 38 (L) 09/09/2014 1220   GFRAA 30 (L) 11/21/2013 0627   Lab Results  Component Value Date   HGBA1C 7.2 02/13/2018   HGBA1C 7.2 12/05/2017   HGBA1C 7.3 10/02/2017   HGBA1C 7.7 (H) 11/20/2013   HGBA1C 9.0 (H) 03/30/2013   No results found for: INSULIN CBC    Component Value Date/Time   WBC 6.9 03/11/2018 1002   WBC 7.8 02/03/2017 1119   RBC 3.47 (L) 03/11/2018 1002   RBC 4.26 02/03/2017 1119   HGB 10.0 (L) 03/11/2018 1002   HCT 30.6 (L) 03/11/2018 1002   PLT 230 02/03/2017 1119   PLT 210 09/09/2014 1220   MCV 88 03/11/2018  1002   MCV 87 09/09/2014 1220   MCH 28.8 03/11/2018 1002   MCH 28.2 02/03/2017 1119   MCHC 32.7 03/11/2018 1002   MCHC 32.4 02/03/2017 1119   RDW 14.4 03/11/2018 1002   RDW 15.1 (H) 09/09/2014 1220   LYMPHSABS 2.0 03/11/2018 1002   LYMPHSABS 2.0 09/09/2014 1220   MONOABS 0.4 02/03/2017 1119   MONOABS 0.5 09/09/2014 1220   EOSABS 0.1 03/11/2018 1002   EOSABS 0.4 09/09/2014 1220   BASOSABS 0.0 03/11/2018 1002   BASOSABS 0.1 09/09/2014 1220   Iron/TIBC/Ferritin/ %Sat    Component Value Date/Time   IRON 39 (L) 10/07/2014 1527   TIBC 202 (L) 10/07/2014 1527   FERRITIN 176 08/04/2014 1527   IRONPCTSAT 19 10/07/2014 1527   Lipid Panel     Component Value Date/Time   CHOL 209 (H) 03/11/2018 1002   CHOL 157 11/20/2013 0954   TRIG 107 03/11/2018 1002   TRIG 114 11/20/2013 0954   HDL 67 03/11/2018 1002   HDL 38 (L) 11/20/2013 0954   VLDL 23 11/20/2013 0954   LDLCALC 121 (H) 03/11/2018 1002   LDLCALC 96 11/20/2013 0954   Hepatic Function Panel     Component Value Date/Time   PROT 6.7 03/11/2018 1002   PROT 7.9 11/19/2013 0934   ALBUMIN 3.7 03/11/2018 1002   ALBUMIN 3.2 (L) 11/19/2013 0934   AST 17 03/11/2018 1002   AST 19 11/19/2013 0934   ALT 14 03/11/2018 1002   ALT 20 11/19/2013 0934   ALKPHOS 143 (H) 03/11/2018 1002   ALKPHOS 95 11/19/2013 0934   BILITOT 0.3 03/11/2018 1002   BILITOT 0.3 11/19/2013 0934      Component Value Date/Time   TSH 1.170 03/11/2018 1002   TSH 1.42 11/20/2013 0954   Results for Rachel Holmes,  Rachel Holmes (MRN [id number]) as of 05/18/2018 08:14  Ref. Range 03/11/2018 10:02  Vitamin D, 25-Hydroxy Latest Ref Range: 30.0 - 100.0 ng/mL 21.4 (L)   ASSESSMENT AND PLAN: Vitamin D deficiency - Plan: Vitamin D, Ergocalciferol, (DRISDOL) 50000 units CAPS capsule  Essential hypertension  At risk for heart disease  Class 3 severe obesity with serious comorbidity and body mass index (BMI) of 45.0 to 49.9 in adult, unspecified obesity type  ([place])  PLAN:  Vitamin D Deficiency Rachel Holmes was informed that low vitamin D levels contributes to fatigue and are associated with obesity, breast, and colon cancer. She agrees to continue to take prescription Vit D _0 ,000 IU every week #4 with no refills. We will recheck labs and she will follow up for routine testing of vitamin D, at least 2-3 times per year. She was informed of the risk of over-replacement of vitamin D and agrees to not increase her dose unless she discusses this with [name] first. Rachel Holmes agrees to follow up as directed.  Hypertension We discussed sodium restriction, working on healthy weight loss, and a regular exercise program as the means to achieve improved blood pressure control. She was strongly encouraged to keep her appointment for sleep study and was informed, that her uncontrolled hypertension put her at high risk of MI or CVA. Rachel Holmes agreed with this plan and agreed to follow up as directed. We will continue to monitor her blood pressure as well as her progress with the above lifestyle modifications. She will continue her medications as prescribed and will watch for signs of hypotension as she continues her lifestyle modifications.  Cardiovascular risk counseling Rachel Holmes was given extended (15 minutes) coronary artery disease prevention counseling today. She is 57 y.o. female and has risk factors for heart disease including obesity and hypertension. We discussed intensive lifestyle modifications today with an emphasis on specific weight loss instructions and strategies. Pt was also informed of the importance of increasing exercise and decreasing saturated fats to help prevent heart disease.  Obesity Rachel Holmes is currently in the action stage of change. As such, her goal is to continue with weight loss efforts She has agreed to keep a food journal with 350 to 500 calories and 35 grams of protein at supper daily and follow the Category 2 plan Rachel Holmes has been instructed to work up to a  goal of 150 minutes of combined cardio and strengthening exercise per week for weight loss and overall health benefits. Exercise bands given and patient shown how to do upper body exercise with them. We discussed the following Behavioral Modification Strategies today: keep a strict food journal, increasing lean protein intake, decreasing simple carbohydrates  and work on meal planning and easy cooking plans  Rachel Holmes has agreed to follow up with our clinic in 3 weeks. She was informed of the importance of frequent follow up visits to maximize her success with intensive lifestyle modifications for her multiple health conditions.   OBESITY BEHAVIORAL INTERVENTION VISIT  Today's visit was # 4 out of 22.  Starting weight: 301 lbs Starting date: 03/11/18 Today's weight : 288 lbs Today's date: 05/14/2018 Total lbs lost to date: 13 (Patients must lose 7 lbs in the first 6 months to continue with counseling)   ASK: We discussed the diagnosis of obesity with Rachel Holmes today and Rachel Holmes agreed to give [name] permission to discuss obesity behavioral modification therapy today.  ASSESS: Rachel Holmes has the diagnosis of obesity and her BMI today is 46.51 Rachel Holmes is in the action stage  of change   ADVISE: Elner was educated on the multiple health risks of obesity as well as the benefit of weight loss to improve her health. She was advised of the need for long term treatment and the importance of lifestyle modifications.  AGREE: Multiple dietary modification options and treatment options were discussed and  Rachel Holmes agreed to the above obesity treatment plan.  I, Doreene Nest, am acting as transcriptionist for Dennard Nip, MD-  I have reviewed the above documentation for accuracy and completeness, and I agree with the above. -Dennard Nip, MD

## 2018-05-19 ENCOUNTER — Ambulatory Visit (INDEPENDENT_AMBULATORY_CARE_PROVIDER_SITE_OTHER): Payer: Medicare HMO | Admitting: Endocrinology

## 2018-05-19 ENCOUNTER — Encounter: Payer: Self-pay | Admitting: Endocrinology

## 2018-05-19 VITALS — Ht 66.0 in

## 2018-05-19 DIAGNOSIS — N183 Chronic kidney disease, stage 3 unspecified: Secondary | ICD-10-CM

## 2018-05-19 DIAGNOSIS — E1122 Type 2 diabetes mellitus with diabetic chronic kidney disease: Secondary | ICD-10-CM

## 2018-05-19 DIAGNOSIS — Z794 Long term (current) use of insulin: Secondary | ICD-10-CM | POA: Diagnosis not present

## 2018-05-19 LAB — POCT GLYCOSYLATED HEMOGLOBIN (HGB A1C): Hemoglobin A1C: 8.5 % — AB (ref 4.0–5.6)

## 2018-05-19 MED ORDER — SITAGLIPTIN PHOSPHATE 50 MG PO TABS: 50.0000 mg | ORAL_TABLET | Freq: Every day | ORAL | 11 refills | Status: DC

## 2018-05-19 NOTE — Patient Instructions (Addendum)
check your blood sugar twice a day.  vary the time of day when you check, between before the 3 meals, and at bedtime.  also check if you have symptoms of your blood sugar being too high or too low.  please keep a record of the readings and bring it to your next appointment here (or you can bring the meter itself).  You can write it on any piece of paper.  please call us sooner if your blood sugar goes below 70, or if you have a lot of readings over 200. I have sent a prescription to your pharmacy, to change the insulin to [place].  There are other pills we can add if necessary Please come back for a follow-up appointment in 2 months.

## 2018-05-19 NOTE — Progress Notes (Signed)
Subjective:    Patient ID: Rachel Holmes, female    DOB: Dec 19, 1960, 57 y.o.   MRN: [id number]  HPI Pt returns for f/u of diabetes mellitus: DM type: Insulin-requiring type 2 Dx'ed: 1988, during a pregnancy, but it persisted after Complications: polyneuropathy, renal failure, leg ulcer, and PDR.   Therapy: insulin since soon after dx GDM: never DKA: never Severe hypoglycemia: 2 episodes (both many years ago).   Pancreatitis: never Pancreatic imaging: normal on 2008 [name] Other: she takes multiple daily injections; she requested to d/c trulicity.   Interval history: Weight loss clinic stopped multiple daily injections, and continues QHS levemir.  no cbg record, but states cbg's varies from 99-295.  It is in general higher as the day goes on.  pt states she feels well in general. Past Medical History:  Diagnosis Date  . Allergic rhinitis   . Anemia   . Anxiety   . Back pain   . Bradycardia   . Breast mass    Patient can no longer palpate specific masses but showed tech general area of concern  . CHF (congestive heart failure) ([place])   . CKD (chronic kidney disease)    STAGE 3  . Constipation   . Diabetes mellitus without complication ([place])   . Dyspnea   . GERD (gastroesophageal reflux disease)   . HLD (hyperlipidemia)   . Hyperparathyroidism ([place])   . Hypertension   . Joint pain   . Leg edema   . Legally blind in left eye, as defined in [place]   . Lymphedema   . Onychomycosis     Past Surgical History:  Procedure Laterality Date  . ABDOMINAL HYSTERECTOMY    . BREAST BIOPSY Left 2014   FNA 12:00 position - Negative  . EYE SURGERY      Social History   Socioeconomic History  . Marital status: Divorced    Spouse name: Not on file  . Number of children: Not on file  . Years of education: Not on file  . Highest education level: Not on file  Occupational History  . Occupation: Glass blower/designer  Social Needs  . Financial resource strain: Not on file  . Food  insecurity:    Worry: Not on file    Inability: Not on file  . Transportation needs:    Medical: Not on file    Non-medical: Not on file  Tobacco Use  . Smoking status: Never Smoker  . Smokeless tobacco: Never Used  Substance and Sexual Activity  . Alcohol use: No  . Drug use: Never  . Sexual activity: Not on file  Lifestyle  . Physical activity:    Days per week: Not on file    Minutes per session: Not on file  . Stress: Not on file  Relationships  . Social connections:    Talks on phone: Not on file    Gets together: Not on file    Attends religious service: Not on file    Active member of club or organization: Not on file    Attends meetings of clubs or organizations: Not on file    Relationship status: Not on file  . Intimate partner violence:    Fear of current or ex partner: Not on file    Emotionally abused: Not on file    Physically abused: Not on file    Forced sexual activity: Not on file  Other Topics Concern  . Not on file  Social History Narrative   ** Merged  History Encounter **        Current Outpatient Medications on File Prior to Visit  Medication Sig Dispense Refill  . albuterol (PROVENTIL HFA;VENTOLIN HFA) 108 (90 Base) MCG/ACT inhaler Inhale 1-2 puffs into the lungs every 6 (six) hours as needed for wheezing or shortness of breath.    [name] amLODipine (NORVASC) 10 MG tablet Take 10 mg by mouth daily.    [name] aspirin EC 325 MG tablet Take 325 mg by mouth daily.    . Blood Glucose Monitoring Suppl (ONE TOUCH ULTRA 2) w/Device KIT 1 Device by Does not apply route daily. 1 each 0  . cloNIDine (CATAPRES) 0.3 MG tablet Take 0.3 mg by mouth 2 (two) times daily.    . Continuous Blood Gluc Sensor (FREESTYLE LIBRE 14 DAY SENSOR) MISC 1 Device by Does not apply route every 14 (fourteen) days. 6 each 11  . ferrous sulfate 325 (65 FE) MG tablet Take 325 mg by mouth 3 (three) times daily with meals.    . furosemide (LASIX) 20 MG tablet Take 20 mg by mouth daily.    [name]  gabapentin (NEURONTIN) 600 MG tablet Take 600 mg by mouth at bedtime.    [name] glucose blood (ONE TOUCH ULTRA TEST) test strip Used to check blood sugars four times daily. 150 each 12  . isosorbide mononitrate (IMDUR) 120 MG 24 hr tablet Take 120 mg by mouth daily.    . Lancets (ONETOUCH ULTRASOFT) lancets Used to check blood sugars four times daily. 200 each 12  . meloxicam (MOBIC) 15 MG tablet Take 15 mg by mouth at bedtime.    . Multiple Vitamin (MULTIVITAMIN WITH MINERALS) TABS tablet Take 1 tablet by mouth daily.    [name] omeprazole (PRILOSEC) 40 MG capsule Take 40 mg by mouth 2 (two) times daily.    . ranitidine (ZANTAC) 300 MG tablet Take 300 mg by mouth at bedtime.    [name] spironolactone (ALDACTONE) 50 MG tablet Take 50 mg by mouth daily.    . Vitamin D, Ergocalciferol, (DRISDOL) 50000 units CAPS capsule Take 1 capsule (50,000 Units total) by mouth every 7 (seven) days. 4 capsule 0   No current facility-administered medications on file prior to visit.     Allergies  Allergen Reactions  . Statins Shortness Of Breath    Wheezing, short of breath  . Codeine Nausea Only  . Ibuprofen Other (See Comments)    Reaction:  Raises pts BP  . Penicillins Hives and Other (See Comments)    Has patient had a PCN reaction causing immediate rash, facial/tongue/throat swelling, SOB or lightheadedness with hypotension: No Has patient had a PCN reaction causing severe rash involving mucus membranes or skin necrosis: No Has patient had a PCN reaction that required hospitalization No Has patient had a PCN reaction occurring within the last 10 years: No If all of the above answers are "NO", then may proceed with Cephalosporin use.  [name] Percocet [Oxycodone-Acetaminophen] Nausea Only  . Tramadol Nausea Only  . Vicodin [Hydrocodone-Acetaminophen] Nausea Only    Family History  Problem Relation Age of Onset  . Breast cancer Sister 92  . Diabetes Sister   . Diabetes Mother   . Hypertension Mother   .  Hyperlipidemia Mother   . Eating disorder Mother   . Obesity Mother     Ht _0  (1.676 m)   BMI 46.48 kg/m    Review of Systems Denies LOC.  She has lost 9 lbs since last ov.  Objective:   Physical Exam VITAL SIGNS:  See vs page GENERAL: no distress Pulses: foot pulses are intact bilaterally.   MSK: chronic deformity left foot/ankle is noted.  CV: 2+ edema of the left leg, and 1+ on the right Skin:  no ulcer on the feet or ankles.  normal color and temp on the feet and ankles.   Neuro: sensation is intact to touch on the feet and ankles, but decreased from normal.    A1c=8.5%  Lab Results  Component Value Date   CREATININE 2.19 (H) 03/11/2018   BUN 30 (H) 03/11/2018   NA 143 03/11/2018   K 4.3 03/11/2018   CL 107 (H) 03/11/2018   CO2 22 03/11/2018      Assessment & Plan:  Type 2 DM: she can prob be managed off insulin, especially if she doe not regain weight.   Edema: this limits rx options Renal insuff: she needs reduced dosage of januvia.   Patient Instructions  check your blood sugar twice a day.  vary the time of day when you check, between before the 3 meals, and at bedtime.  also check if you have symptoms of your blood sugar being too high or too low.  please keep a record of the readings and bring it to your next appointment here (or you can bring the meter itself).  You can write it on any piece of paper.  please call us sooner if your blood sugar goes below 70, or if you have a lot of readings over 200. I have sent a prescription to your pharmacy, to change the insulin to [place].  There are other pills we can add if necessary Please come back for a follow-up appointment in 2 months.

## 2018-06-04 ENCOUNTER — Ambulatory Visit (INDEPENDENT_AMBULATORY_CARE_PROVIDER_SITE_OTHER): Payer: Medicare HMO | Admitting: Family Medicine

## 2018-06-04 VITALS — BP 195/66 | HR 65 | Temp 97.7°F | Ht 66.0 in | Wt 291.0 lb

## 2018-06-04 DIAGNOSIS — Z6841 Body Mass Index (BMI) 40.0 and over, adult: Secondary | ICD-10-CM

## 2018-06-04 DIAGNOSIS — E559 Vitamin D deficiency, unspecified: Secondary | ICD-10-CM

## 2018-06-04 DIAGNOSIS — N184 Chronic kidney disease, stage 4 (severe): Secondary | ICD-10-CM | POA: Diagnosis not present

## 2018-06-04 MED ORDER — VITAMIN D (ERGOCALCIFEROL) 1.25 MG (50000 UNIT) PO CAPS: 50000.0000 [IU] | ORAL_CAPSULE | ORAL | 0 refills | Status: DC

## 2018-06-05 ENCOUNTER — Encounter: Payer: Self-pay | Admitting: Podiatry

## 2018-06-05 ENCOUNTER — Ambulatory Visit: Payer: Medicare HMO | Admitting: Podiatry

## 2018-06-05 ENCOUNTER — Ambulatory Visit (INDEPENDENT_AMBULATORY_CARE_PROVIDER_SITE_OTHER): Payer: Medicare HMO | Admitting: Family Medicine

## 2018-06-05 ENCOUNTER — Other Ambulatory Visit: Payer: Medicare Other

## 2018-06-05 ENCOUNTER — Telehealth: Payer: Self-pay | Admitting: Endocrinology

## 2018-06-05 ENCOUNTER — Encounter: Payer: Self-pay | Admitting: Family Medicine

## 2018-06-05 DIAGNOSIS — M79675 Pain in left toe(s): Secondary | ICD-10-CM

## 2018-06-05 DIAGNOSIS — B351 Tinea unguium: Secondary | ICD-10-CM

## 2018-06-05 DIAGNOSIS — M2141 Flat foot [pes planus] (acquired), right foot: Secondary | ICD-10-CM

## 2018-06-05 DIAGNOSIS — M79674 Pain in right toe(s): Secondary | ICD-10-CM | POA: Diagnosis not present

## 2018-06-05 DIAGNOSIS — E1142 Type 2 diabetes mellitus with diabetic polyneuropathy: Secondary | ICD-10-CM

## 2018-06-05 DIAGNOSIS — M2142 Flat foot [pes planus] (acquired), left foot: Secondary | ICD-10-CM

## 2018-06-05 DIAGNOSIS — M1711 Unilateral primary osteoarthritis, right knee: Secondary | ICD-10-CM

## 2018-06-05 NOTE — Progress Notes (Addendum)
[facility] [address] [place], [place] [address] Phone: [phone] Subjective:        CC: Right knee  SAY:[id number]  Rachel Holmes is a 57 y.o. female coming in with complaint of right knee pain. States that the last injection helped. Has been losing weight which has helped her knee pain.  Patient last injection was 4 months ago.  Started having worsening pain again.  Started having increasing swelling.  Rates the severity pain is 7 out of 10     Past Medical History:  Diagnosis Date  . Allergic rhinitis   . Anemia   . Anxiety   . Back pain   . Bradycardia   . Breast mass    Patient can no longer palpate specific masses but showed tech general area of concern  . CHF (congestive heart failure) ([place])   . CKD (chronic kidney disease)    STAGE 3  . Constipation   . Diabetes mellitus without complication ([place])   . Dyspnea   . GERD (gastroesophageal reflux disease)   . HLD (hyperlipidemia)   . Hyperparathyroidism ([place])   . Hypertension   . Joint pain   . Leg edema   . Legally blind in left eye, as defined in [place]   . Lymphedema   . Onychomycosis    Past Surgical History:  Procedure Laterality Date  . ABDOMINAL HYSTERECTOMY    . BREAST BIOPSY Left 2014   FNA 12:00 position - Negative  . EYE SURGERY     Social History   Socioeconomic History  . Marital status: Divorced    Spouse name: Not on file  . Number of children: Not on file  . Years of education: Not on file  . Highest education level: Not on file  Occupational History  . Occupation: Glass blower/designer  Social Needs  . Financial resource strain: Not on file  . Food insecurity:    Worry: Not on file    Inability: Not on file  . Transportation needs:    Medical: Not on file    Non-medical: Not on file  Tobacco Use  . Smoking status: Never Smoker  . Smokeless tobacco: Never Used  Substance and Sexual Activity  . Alcohol use: No  . Drug use: Never  . Sexual  activity: Not on file  Lifestyle  . Physical activity:    Days per week: Not on file    Minutes per session: Not on file  . Stress: Not on file  Relationships  . Social connections:    Talks on phone: Not on file    Gets together: Not on file    Attends religious service: Not on file    Active member of club or organization: Not on file    Attends meetings of clubs or organizations: Not on file    Relationship status: Not on file  Other Topics Concern  . Not on file  Social History Narrative   ** Merged History Encounter **       Allergies  Allergen Reactions  . Statins Shortness Of Breath    Wheezing, short of breath  . Codeine Nausea Only  . Ibuprofen Other (See Comments)    Reaction:  Raises pts BP  . Penicillins Hives and Other (See Comments)    Has patient had a PCN reaction causing immediate rash, facial/tongue/throat swelling, SOB or lightheadedness with hypotension: No Has patient had a PCN reaction causing severe rash involving mucus membranes or skin  necrosis: No Has patient had a PCN reaction that required hospitalization No Has patient had a PCN reaction occurring within the last 10 years: No If all of the above answers are "NO", then may proceed with Cephalosporin use.  [name] Percocet [Oxycodone-Acetaminophen] Nausea Only  . Tramadol Nausea Only  . Vicodin [Hydrocodone-Acetaminophen] Nausea Only   Family History  Problem Relation Age of Onset  . Breast cancer Sister 42  . Diabetes Sister   . Diabetes Mother   . Hypertension Mother   . Hyperlipidemia Mother   . Eating disorder Mother   . Obesity Mother      Past medical history, social, surgical and family history all reviewed in electronic medical record.  No pertanent information unless stated regarding to the chief complaint.   Review of Systems:Review of systems updated and as accurate as of 06/05/18  No headache, visual changes, nausea, vomiting, diarrhea, constipation, dizziness, abdominal pain, skin  rash, fevers, chills, night sweats, weight loss, swollen lymph nodes, body aches, joint swelling, chest pain, shortness of breath, mood changes.  Positive muscle aches, joint swelling  Objective  Blood pressure 136/84, pulse 65, height 5\' 6"  (1.676 m), weight 291 lb (132 kg), SpO2 96 %. Systems examined below as of 06/05/18   General: No apparent distress alert and oriented x3 mood and affect normal, dressed appropriately.  HEENT: Pupils equal, extraocular movements intact  Respiratory: Patient's speak in full sentences and does not appear short of breath  Cardiovascular: No lower extremity edema, non tender, no erythema  Skin: Warm dry intact with no signs of infection or rash on extremities or on axial skeleton.  Abdomen: Soft nontender [place] obese Neuro: Cranial nerves II through XII are intact, neurovascularly intact in all extremities with 2+ DTRs and 2+ pulses.  Lymph: No lymphadenopathy of posterior or anterior cervical chain or axillae bilaterally.  Gait antalgic MSK:  Non tender with full range of motion and good stability and symmetric strength and tone of shoulders, elbows, wrist, hip, and ankles bilaterally.  Knee: Right valgus deformity noted. Large thigh to calf ratio.  Tender to palpation over medial and PF joint line.  ROM full in flexion and extension and lower leg rotation. instability with valgus force.  painful patellar compression. Patellar glide with moderate crepitus. Patellar and quadriceps tendons unremarkable. Hamstring and quadriceps strength is normal. Contralateral knee shows mild arthritic changes  After informed written and verbal consent, patient was seated on exam table. Right knee was prepped with alcohol swab and utilizing anterolateral approach, patient's right knee space was injected with 4:1  marcaine 0.5%: Kenalog 40mg /dL. Patient tolerated the procedure well without immediate complications.    Impression and Recommendations:     This case  required medical decision making of moderate complexity.      Note: This dictation was prepared with Dragon dictation along with smaller phrase technology. Any transcriptional errors that result from this process are unintentional.

## 2018-06-05 NOTE — Patient Instructions (Signed)
God to see you  Been 4 months which is good.  Can repeat the injection every 3 months if you would like.  If this does not work we can consider other injections as well  See me again in 4 weeks

## 2018-06-05 NOTE — Addendum Note (Signed)
Addended by: Aviva Signs M on: 06/05/2018 03:17 PM   Modules accepted: Orders

## 2018-06-05 NOTE — Telephone Encounter (Signed)
Please advise 

## 2018-06-05 NOTE — Telephone Encounter (Signed)
Pt is aware.  

## 2018-06-05 NOTE — Telephone Encounter (Signed)
Patient called re: Rachel Holmes is not working. When she wakes up in the morning her sugars are over 300. Please call patient at ph# [phone] to advise.

## 2018-06-05 NOTE — Progress Notes (Signed)
Complaint:  Visit Type: Patient returns to my office for continued preventative foot care services. Complaint: Patient states" my nails have grown long and thick and become painful to walk and wear shoes" Patient has been diagnosed with DM with no foot complications. The patient presents for preventative foot care services. No changes to ROS  Podiatric Exam: Vascular: dorsalis pedis  are palpable bilateral. Posterior tibial pulses are absent  B/L due to swelling. Capillary return is immediate. Temperature gradient is WNL. Skin turgor WNL  Sensorium: Diminished  Semmes Weinstein monofilament test. Normal tactile sensation bilaterally. Nail Exam: Pt has thick disfigured discolored nails with subungual debris noted bilateral entire nail hallux through fifth toenails Ulcer Exam: There is no evidence of ulcer or pre-ulcerative changes or infection. Orthopedic Exam: Muscle tone and strength are WNL. No limitations in general ROM. No crepitus or effusions noted. Foot type and digits show no abnormalities. Bony prominences are unremarkable. Skin: No Porokeratosis. No infection or ulcers  Diagnosis:  Onychomycosis, , Pain in right toe, pain in left toes  Treatment & Plan Procedures and Treatment: Consent by patient was obtained for treatment procedures.   Debridement of mycotic and hypertrophic toenails, 1 through 5 bilateral and clearing of subungual debris. No ulceration, no infection noted.  Return Visit-Office Procedure: Patient instructed to return to the office for a follow up visit 3 months for continued evaluation and treatment.    Gardiner Barefoot DPM

## 2018-06-05 NOTE — Telephone Encounter (Signed)
Please continue the same januvia Please resume novolog at 5 units 3 times a day (just before each meal). Please call or message [name] next week, to tell us how the blood sugar is doing I'll see you next time.

## 2018-06-05 NOTE — Assessment & Plan Note (Signed)
Patient given injection and tolerated the procedure well.  We discussed icing regimen and home exercises.  Discussed which activities of doing which wants to avoid.  Patient will follow-up with me in 10 weeks if continuing to want the steroid otherwise consider Visco supplementation

## 2018-06-06 ENCOUNTER — Other Ambulatory Visit: Payer: Self-pay | Admitting: Family Medicine

## 2018-06-06 MED ORDER — DOXYCYCLINE HYCLATE 100 MG PO TABS: 100.0000 mg | ORAL_TABLET | Freq: Two times a day (BID) | ORAL | 0 refills | Status: AC

## 2018-06-06 NOTE — Progress Notes (Signed)
Sent in doxy to cover for small elevation in WBC in knee aspiration

## 2018-06-08 ENCOUNTER — Telehealth: Payer: Self-pay | Admitting: Endocrinology

## 2018-06-08 LAB — SYNOVIAL CELL COUNT + DIFF, W/ CRYSTALS
Basophils, %: 0 %
Eosinophils-Synovial: 0 % (ref 0–2)
Lymphocytes-Synovial Fld: 25 % (ref 0–74)
Monocyte/Macrophage: 58 % (ref 0–69)
Neutrophil, Synovial: 1 % (ref 0–24)
Synoviocytes, %: 16 % — ABNORMAL HIGH (ref 0–15)
WBC, Synovial: 6140 cells/uL — ABNORMAL HIGH (ref <150)

## 2018-06-08 LAB — PROTEIN, SYNOVIAL FLUID: PROTEIN, TOTAL, SYNOVIAL FLUID: 3.1 g/dL — ABNORMAL HIGH (ref 1.0–3.0)

## 2018-06-08 NOTE — Progress Notes (Signed)
Office: [phone]  /  Fax: [phone]   HPI:   Chief Complaint: OBESITY Rachel Holmes is here to discuss her progress with her obesity treatment plan. She is on the keep a food journal with 350 to 500 calories and 35 grams of protein  and follow the Category 2 plan and is following her eating plan approximately 75 to 80 % of the time. She states she is walking and weights 30 minutes 2 to 3 times per week. Rachel Holmes hasn't been following her plan as closely over the last 2 to 3 weeks and has been struggling with meal planning and prepping. Her weight is 291 lb (132 kg) today and has had a weight gain of 3 pounds over a period of 3 weeks since her last visit. She has lost 10 lbs since starting treatment with [name].  Chronic Renal Insufficiency Rachel Holmes states her PCP referred her to nephrology, but she hasn't been scheduled yet.  Diabetes II Rachel Holmes has a diagnosis of diabetes type II. She saw her endocrinologist recently and he discontinued her insulin and kept her on Januvia. She states her glucose reading have increased to the 300's now. [name] denies any hypoglycemic episodes. Last A1c was at 8.5 She has been working on intensive lifestyle modifications including diet, exercise, and weight loss to help control her blood glucose levels.  Vitamin D deficiency Rachel Holmes has a diagnosis of vitamin D deficiency. Rachel Holmes is on prescription vit D and her last level was not at goal. She denies nausea, vomiting or muscle weakness.  ALLERGIES: Allergies  Allergen Reactions  . Statins Shortness Of Breath    Wheezing, short of breath  . Codeine Nausea Only  . Ibuprofen Other (See Comments)    Reaction:  Raises pts BP  . Penicillins Hives and Other (See Comments)    Has patient had a PCN reaction causing immediate rash, facial/tongue/throat swelling, SOB or lightheadedness with hypotension: No Has patient had a PCN reaction causing severe rash involving mucus membranes or skin necrosis: No Has patient had a PCN  reaction that required hospitalization No Has patient had a PCN reaction occurring within the last 10 years: No If all of the above answers are "NO", then may proceed with Cephalosporin use.  Rachel Holmes Percocet [Oxycodone-Acetaminophen] Nausea Only  . Tramadol Nausea Only  . Vicodin [Hydrocodone-Acetaminophen] Nausea Only    MEDICATIONS: Current Outpatient Medications on File Prior to Visit  Medication Sig Dispense Refill  . albuterol (PROVENTIL HFA;VENTOLIN HFA) 108 (90 Base) MCG/ACT inhaler Inhale 1-2 puffs into the lungs every 6 (six) hours as needed for wheezing or shortness of breath.    Rachel Holmes amLODipine (NORVASC) 10 MG tablet Take 10 mg by mouth daily.    Rachel Holmes aspirin EC 325 MG tablet Take 325 mg by mouth daily.    . Blood Glucose Monitoring Suppl (ONE TOUCH ULTRA 2) w/Device KIT 1 Device by Does not apply route daily. 1 each 0  . cloNIDine (CATAPRES) 0.3 MG tablet Take 0.3 mg by mouth 2 (two) times daily.    . Continuous Blood Gluc Sensor (FREESTYLE LIBRE 14 DAY SENSOR) MISC 1 Device by Does not apply route every 14 (fourteen) days. 6 each 11  . ferrous sulfate 325 (65 FE) MG tablet Take 325 mg by mouth 3 (three) times daily with meals.    . furosemide (LASIX) 20 MG tablet Take 20 mg by mouth daily.    Rachel Holmes gabapentin (NEURONTIN) 600 MG tablet Take 600 mg by mouth at bedtime.    Rachel Holmes glucose  blood (ONE TOUCH ULTRA TEST) test strip Used to check blood sugars four times daily. 150 each 12  . isosorbide mononitrate (IMDUR) 120 MG 24 hr tablet Take 120 mg by mouth daily.    . Lancets (ONETOUCH ULTRASOFT) lancets Used to check blood sugars four times daily. 200 each 12  . meloxicam (MOBIC) 15 MG tablet Take 15 mg by mouth at bedtime.    . Multiple Vitamin (MULTIVITAMIN WITH MINERALS) TABS tablet Take 1 tablet by mouth daily.    Rachel Holmes omeprazole (PRILOSEC) 40 MG capsule Take 40 mg by mouth 2 (two) times daily.    . ranitidine (ZANTAC) 300 MG tablet Take 300 mg by mouth at bedtime.    . sitaGLIPtin (JANUVIA) 50 MG  tablet Take 1 tablet (50 mg total) by mouth daily. 30 tablet 11  . spironolactone (ALDACTONE) 50 MG tablet Take 50 mg by mouth daily.     No current facility-administered medications on file prior to visit.     PAST MEDICAL HISTORY: Past Medical History:  Diagnosis Date  . Allergic rhinitis   . Anemia   . Anxiety   . Back pain   . Bradycardia   . Breast mass    Patient can no longer palpate specific masses but showed tech general area of concern  . CHF (congestive heart failure) ([place])   . CKD (chronic kidney disease)    STAGE 3  . Constipation   . Diabetes mellitus without complication ([place])   . Dyspnea   . GERD (gastroesophageal reflux disease)   . HLD (hyperlipidemia)   . Hyperparathyroidism ([place])   . Hypertension   . Joint pain   . Leg edema   . Legally blind in left eye, as defined in [place]   . Lymphedema   . Onychomycosis     PAST SURGICAL HISTORY: Past Surgical History:  Procedure Laterality Date  . ABDOMINAL HYSTERECTOMY    . BREAST BIOPSY Left 2014   FNA 12:00 position - Negative  . EYE SURGERY      SOCIAL HISTORY: Social History   Tobacco Use  . Smoking status: Never Smoker  . Smokeless tobacco: Never Used  Substance Use Topics  . Alcohol use: No  . Drug use: Never    FAMILY HISTORY: Family History  Problem Relation Age of Onset  . Breast cancer Sister 42  . Diabetes Sister   . Diabetes Mother   . Hypertension Mother   . Hyperlipidemia Mother   . Eating disorder Mother   . Obesity Mother     ROS: Review of Systems  Constitutional: Negative for weight loss.  Gastrointestinal: Negative for nausea and vomiting.  Musculoskeletal:       Negative for muscle weakness  Endo/Heme/Allergies:       Positive for hyperglycemia Negative for hypoglycemia    PHYSICAL EXAM: Blood pressure (!) 195/66, pulse 65, temperature 97.7 F (36.5 C), temperature source Oral, height 5' 6"  (1.676 m), weight 291 lb (132 kg), SpO2 99 %. Body mass index is 46.97  kg/m. Physical Exam  Constitutional: She is oriented to person, place, and time. She appears well-developed and well-nourished.  Cardiovascular: Normal rate.  Pulmonary/Chest: Effort normal.  Musculoskeletal: Normal range of motion.  Neurological: She is oriented to person, place, and time.  Skin: Skin is warm and dry.  Psychiatric: She has a normal mood and affect. Her behavior is normal.  Vitals reviewed.   RECENT LABS AND TESTS: BMET    Component Value Date/Time   NA 143 03/11/2018  1002   NA 137 11/21/2013 0627   K 4.3 03/11/2018 1002   K 4.6 11/21/2013 0627   CL 107 (H) 03/11/2018 1002   CL 107 11/21/2013 0627   CO2 22 03/11/2018 1002   CO2 24 11/21/2013 0627   GLUCOSE 124 (H) 03/11/2018 1002   GLUCOSE 86 02/03/2017 1119   GLUCOSE 95 11/21/2013 0627   BUN 30 (H) 03/11/2018 1002   BUN 34 (H) 11/21/2013 0627   CREATININE 2.19 (H) 03/11/2018 1002   CREATININE 1.80 (H) 09/09/2014 1220   CALCIUM 8.7 03/11/2018 1002   CALCIUM 8.8 09/09/2014 1220   GFRNONAA 24 (L) 03/11/2018 1002   GFRNONAA 31 (L) 09/09/2014 1220   GFRNONAA 26 (L) 11/21/2013 0627   GFRAA 28 (L) 03/11/2018 1002   GFRAA 38 (L) 09/09/2014 1220   GFRAA 30 (L) 11/21/2013 0627   Lab Results  Component Value Date   HGBA1C 8.5 (A) 05/19/2018   HGBA1C 7.2 02/13/2018   HGBA1C 7.2 12/05/2017   HGBA1C 7.3 10/02/2017   HGBA1C 7.7 (H) 11/20/2013   No results found for: INSULIN CBC    Component Value Date/Time   WBC 6.9 03/11/2018 1002   WBC 7.8 02/03/2017 1119   RBC 3.47 (L) 03/11/2018 1002   RBC 4.26 02/03/2017 1119   HGB 10.0 (L) 03/11/2018 1002   HCT 30.6 (L) 03/11/2018 1002   PLT 230 02/03/2017 1119   PLT 210 09/09/2014 1220   MCV 88 03/11/2018 1002   MCV 87 09/09/2014 1220   MCH 28.8 03/11/2018 1002   MCH 28.2 02/03/2017 1119   MCHC 32.7 03/11/2018 1002   MCHC 32.4 02/03/2017 1119   RDW 14.4 03/11/2018 1002   RDW 15.1 (H) 09/09/2014 1220   LYMPHSABS 2.0 03/11/2018 1002   LYMPHSABS 2.0  09/09/2014 1220   MONOABS 0.4 02/03/2017 1119   MONOABS 0.5 09/09/2014 1220   EOSABS 0.1 03/11/2018 1002   EOSABS 0.4 09/09/2014 1220   BASOSABS 0.0 03/11/2018 1002   BASOSABS 0.1 09/09/2014 1220   Iron/TIBC/Ferritin/ %Sat    Component Value Date/Time   IRON 39 (L) 10/07/2014 1527   TIBC 202 (L) 10/07/2014 1527   FERRITIN 176 08/04/2014 1527   IRONPCTSAT 19 10/07/2014 1527   Lipid Panel     Component Value Date/Time   CHOL 209 (H) 03/11/2018 1002   CHOL 157 11/20/2013 0954   TRIG 107 03/11/2018 1002   TRIG 114 11/20/2013 0954   HDL 67 03/11/2018 1002   HDL 38 (L) 11/20/2013 0954   VLDL 23 11/20/2013 0954   LDLCALC 121 (H) 03/11/2018 1002   LDLCALC 96 11/20/2013 0954   Hepatic Function Panel     Component Value Date/Time   PROT 6.7 03/11/2018 1002   PROT 7.9 11/19/2013 0934   ALBUMIN 3.7 03/11/2018 1002   ALBUMIN 3.2 (L) 11/19/2013 0934   AST 17 03/11/2018 1002   AST 19 11/19/2013 0934   ALT 14 03/11/2018 1002   ALT 20 11/19/2013 0934   ALKPHOS 143 (H) 03/11/2018 1002   ALKPHOS 95 11/19/2013 0934   BILITOT 0.3 03/11/2018 1002   BILITOT 0.3 11/19/2013 0934      Component Value Date/Time   TSH 1.170 03/11/2018 1002   TSH 1.42 11/20/2013 0954   Results for ORELLA, CUSHMAN (MRN [id number]) as of 06/08/2018 10:15  Ref. Range 03/11/2018 10:02  Vitamin D, 25-Hydroxy Latest Ref Range: 30.0 - 100.0 ng/mL 21.4 (L)   ASSESSMENT AND PLAN: Chronic renal impairment, stage 4 (severe) (HCC) - Plan: Ambulatory referral  to Nephrology  Vitamin D deficiency - Plan: Vitamin D, Ergocalciferol, (DRISDOL) 50000 units CAPS capsule  Class 3 severe obesity with serious comorbidity and body mass index (BMI) of 45.0 to 49.9 in adult, unspecified obesity type [facility])  PLAN:  Chronic Renal Insufficiency We will refer to nephrology and Latreece will follow up at the agreed upon time.  Diabetes II Anniah has been given extensive diabetes education by myself today including ideal fasting and  post-prandial blood glucose readings, individual ideal Hgb A1c goals and hypoglycemia prevention. We discussed the importance of good blood sugar control to decrease the likelihood of diabetic complications such as nephropathy, neuropathy, limb loss, blindness, coronary artery disease, and death. We discussed the importance of intensive lifestyle modification including diet, exercise and weight loss as the first line treatment for diabetes. Denica agrees to contact her endocrinologist tomorrow to see if he wants to add back insulin and she will work on diet in the meantime. Mafalda will follow up at the agreed upon time.  Vitamin D Deficiency Coralie was informed that low vitamin D levels contributes to fatigue and are associated with obesity, breast, and colon cancer. She agrees to continue to take prescription Vit D @50 ,000 IU every week #4 with no refills and will follow up for routine testing of vitamin D, at least 2-3 times per year. She was informed of the risk of over-replacement of vitamin D and agrees to not increase her dose unless she discusses this with [name] first. We will recheck labs in 3 weeks and Takita agrees to follow up at the agreed upon time.  Obesity Oliva is currently in the action stage of change. As such, her goal is to continue with weight loss efforts She has agreed to follow the Category 2 plan Trezure has been instructed to work up to a goal of 150 minutes of combined cardio and strengthening exercise per week for weight loss and overall health benefits. We discussed the following Behavioral Modification Strategies today: increase H2O intake, increasing vegetables and decreasing sodium intake  Cynara has agreed to follow up with our clinic in 3 weeks fasting. She was informed of the importance of frequent follow up visits to maximize her success with intensive lifestyle modifications for her multiple health conditions.   OBESITY BEHAVIORAL INTERVENTION VISIT  Today's visit was # 4  out of 22.  Starting weight: 301 lbs Starting date: 03/11/18 Today's weight : 291 lbs  Today's date: 06/04/2018 Total lbs lost to date: 10    ASK: We discussed the diagnosis of obesity with Rondel Jumbo today and Elizet agreed to give [name] permission to discuss obesity behavioral modification therapy today.  ASSESS: Novia has the diagnosis of obesity and her BMI today is 46.99 Akshara is in the action stage of change   ADVISE: Dannya was educated on the multiple health risks of obesity as well as the benefit of weight loss to improve her health. She was advised of the need for long term treatment and the importance of lifestyle modifications.  AGREE: Multiple dietary modification options and treatment options were discussed and  Elyssia agreed to the above obesity treatment plan.  I, Doreene Nest, am acting as transcriptionist for Dennard Nip, MD  I have reviewed the above documentation for accuracy and completeness, and I agree with the above. -Dennard Nip, MD

## 2018-06-08 NOTE — Telephone Encounter (Signed)
D/c januvia. Please continue the same insulin Please call or message [name] in a few days, to tell us how the blood sugar is doing

## 2018-06-08 NOTE — Telephone Encounter (Signed)
Patient stated that she would d/c januvia &let us know ina few days how CBG's are doing.

## 2018-06-08 NOTE — Telephone Encounter (Signed)
Patient cannot take Januvia and insulin together. Makes her very sick. Patient quit taking Januvia. Please call patient at ph# [phone] to advise.

## 2018-06-16 ENCOUNTER — Encounter: Payer: Self-pay | Admitting: Neurology

## 2018-06-16 ENCOUNTER — Ambulatory Visit: Payer: Medicare HMO | Admitting: Neurology

## 2018-06-16 VITALS — BP 178/72 | HR 59 | Ht 66.0 in | Wt 293.0 lb

## 2018-06-16 DIAGNOSIS — I129 Hypertensive chronic kidney disease with stage 1 through stage 4 chronic kidney disease, or unspecified chronic kidney disease: Secondary | ICD-10-CM

## 2018-06-16 DIAGNOSIS — E08319 Diabetes mellitus due to underlying condition with unspecified diabetic retinopathy without macular edema: Secondary | ICD-10-CM

## 2018-06-16 DIAGNOSIS — E662 Morbid (severe) obesity with alveolar hypoventilation: Secondary | ICD-10-CM

## 2018-06-16 DIAGNOSIS — Z6841 Body Mass Index (BMI) 40.0 and over, adult: Secondary | ICD-10-CM

## 2018-06-16 DIAGNOSIS — R0602 Shortness of breath: Secondary | ICD-10-CM | POA: Diagnosis not present

## 2018-06-16 DIAGNOSIS — IMO0002 Reserved for concepts with insufficient information to code with codable children: Secondary | ICD-10-CM

## 2018-06-16 DIAGNOSIS — E0865 Diabetes mellitus due to underlying condition with hyperglycemia: Secondary | ICD-10-CM

## 2018-06-16 DIAGNOSIS — G4719 Other hypersomnia: Secondary | ICD-10-CM

## 2018-06-16 DIAGNOSIS — N184 Chronic kidney disease, stage 4 (severe): Secondary | ICD-10-CM

## 2018-06-16 DIAGNOSIS — E66813 Obesity, class 3: Secondary | ICD-10-CM

## 2018-06-16 NOTE — Progress Notes (Signed)
SLEEP MEDICINE CLINIC   Provider:  Larey Seat, [place] D  Primary Care Physician:  Sharyne Peach, MD   Referring Provider: Dennard Nip, MD    Chief Complaint  Patient presents with  . New Patient (Initial Visit)    pt alone, rm 11 pt states Rachel BeasleyMD at the weight management center wanted the patient to be evaluate by sleep study due to the pt having elevated bp .denies waking up gasping for air and states unknown to her if she snores or has apneic spells.     HPI:  Rachel Holmes is a 57 y.o. afro-American , right handed  female patient , seen here as in a referral by Dr. Leafy Ro.   I have the pleasure of meeting Rachel Holmes today, a 57 year old female patient of Dr. Redgie Grayer at the medical weight management center.  The patient has followed Dr. Leafy Ro since March she has lost between 5 and 7 pounds since initially starting with the program.  She is following the category to plan.  She is advised to reduce carbohydrate intake, increase water intake and lean protein.  Her starting weight on Mar 11, 2018 was 301 pounds, and Dr. Leafy Ro has noted over the repeat visits with the patient that her blood pressure is frequently elevated.  This in addition to several other internal medicine conditions such as CHF, decreased renal clearance, the patient has reached stage of CKD 4.  She has uncontrolled hypertension and at this time is not on kidney protective HTN medicine.   She is also diagnosed with vitamin D deficiency, with iron deficiency anemia of chronic disease. She has low iron levels, normal ferritin levels, and her BMI is just under 50.   I reviewed the patient's labs I will level was 39 considered low total iron binding capacity was 202 also low ferritin was 176, her overall cholesterol was just mildly elevated at 209, her good cholesterol HDL was 67, albumin level 3.7, her hemoglobin was 10, hematocrit was 30.6, and red blood cell count 3.47 all these are low values for  a patient with chronic kidney disease these indicate chronic disease anemia.  Her BUN most recently was 13, her creatinine 2.19.  HbA1c has been 7.2 in April has been at this level since November 2018.  She is using insulin.  Interestingly she is still on Mobic meloxicam which may not be the best choice for her kidney function, neither for her hypertension.  She is taking Lasix, gabapentin, clonidine , Catapres, amlodipine, albuterol inhaler,  insulin Detimir  she also has a prescription for indoor and multivitamins with minerals probably meant to supplement iron.     Chief complaint according to patient : presents with  Very high BP-  She reports being sleepy all day, nocturia 3-4 times, but not in daytime. She takes lasix in AM. She is unsure if she snores or has apnea, wakes up frequently.   Sleep habits are as follows: She eats dinner before 7 Pm and mostly stays at home watching TV, sometimes sleeping while doing so. She lives alone. She goes to bed between 9 and 11 Pm, and usually falls right asleep, but stays only asleep for 2 hours before she needs to urinate, and this pattern continues for another 2-3 times.  The bedroom is neither quiet nor dark, she sleeps with her TV on, all night. She sleeps on multiple pillows, cannot breath on a flat surface. She sleeps on her side.  She dreams. She rises  at 5 AM- and averages 5 hours of sleep most nights. She feels unrested and unrestored, has cramping in both legs.  No headaches, no dizziness, no palpitations.    Sleep medical history and family sleep history: 1 of her sisters carries a diagnosis of sleep apnea and uses a CPAP machine.  The patient has never been evaluated for the presence of sleep apnea before. 2 sisters with renal disease, Due to her declining kidney function she supposed to be referred to [facility].    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  Sometimes she said sometimes she stands or has to walk, but most of her work is sedentary. Her sisters live near by. Her son lives in [place], age 72.  The patient is a non-smoker, she does not drink alcohol, she drinks caffeine in form of soda, not coffee.  Sodas 2 a day now, mountain dew.  She cut down to the current amount from her baseline but she drank 6 a day.    Review of Systems: Out of a complete 14 system review, the patient complains of only the following symptoms, and all other reviewed systems are negative.  Leg cramps in the morning,not before bedtime, high BP , nocturia.    Epworth score  11/ 24 -  , Fatigue severity score 24   , depression score n/a    Social History   Socioeconomic History  . Marital status: Divorced    Spouse name: Not on file  . Number of children: Not on file  . Years of education: Not on file  . Highest education level: Not on file  Occupational History  . Occupation: Glass blower/designer  Social Needs  . Financial resource strain: Not on file  . Food insecurity:    Worry: Not on file    Inability: Not on file  . Transportation needs:    Medical: Not on file    Non-medical: Not on file  Tobacco Use  . Smoking status: Never Smoker  . Smokeless tobacco: Never Used  Substance and Sexual Activity  . Alcohol use: No  . Drug use: Never  . Sexual activity: Not on file  Lifestyle  . Physical activity:    Days per week: Not on file    Minutes per session: Not on file  . Stress: Not on file  Relationships  . Social connections:    Talks on phone: Not on file    Gets together: Not on file    Attends religious service: Not on file    Active member of club or organization: Not on file    Attends meetings of clubs or organizations: Not on file    Relationship status: Not on file  . Intimate partner violence:    Fear of current or ex partner: Not on file    Emotionally abused: Not on file    Physically abused: Not on file    Forced  sexual activity: Not on file  Other Topics Concern  . Not on file  Social History Narrative   ** Merged History Encounter **        Family History  Problem Relation Age of Onset  . Breast cancer Sister 74  . Diabetes Sister   . Diabetes Mother   . Hypertension Mother   . Hyperlipidemia Mother   . Eating disorder Mother   . Obesity Mother     Past  Medical History:  Diagnosis Date  . Allergic rhinitis   . Anemia   . Anxiety   . Back pain   . Bradycardia   . Breast mass    Patient can no longer palpate specific masses but showed tech general area of concern  . CHF (congestive heart failure) ([place])   . CKD (chronic kidney disease)    STAGE 3  . Constipation   . Diabetes mellitus without complication ([place])   . Dyspnea   . GERD (gastroesophageal reflux disease)   . HLD (hyperlipidemia)   . Hyperparathyroidism ([place])   . Hypertension   . Joint pain   . Leg edema   . Legally blind in left eye, as defined in [place]   . Lymphedema   . Onychomycosis     Past Surgical History:  Procedure Laterality Date  . ABDOMINAL HYSTERECTOMY    . BREAST BIOPSY Left 2014   FNA 12:00 position - Negative  . EYE SURGERY      Current Outpatient Medications  Medication Sig Dispense Refill  . albuterol (PROVENTIL HFA;VENTOLIN HFA) 108 (90 Base) MCG/ACT inhaler Inhale 1-2 puffs into the lungs every 6 (six) hours as needed for wheezing or shortness of breath.    [name] amLODipine (NORVASC) 10 MG tablet Take 10 mg by mouth daily.    [name] aspirin EC 325 MG tablet Take 325 mg by mouth daily.    . Blood Glucose Monitoring Suppl (ONE TOUCH ULTRA 2) w/Device KIT 1 Device by Does not apply route daily. 1 each 0  . cloNIDine (CATAPRES) 0.3 MG tablet Take 0.3 mg by mouth 2 (two) times daily.    . Continuous Blood Gluc Sensor (FREESTYLE LIBRE 14 DAY SENSOR) MISC 1 Device by Does not apply route every 14 (fourteen) days. 6 each 11  . doxycycline (VIBRA-TABS) 100 MG tablet Take 1 tablet (100 mg total) by mouth 2  (two) times daily for 10 days. 20 tablet 0  . ferrous sulfate 325 (65 FE) MG tablet Take 325 mg by mouth 3 (three) times daily with meals.    . furosemide (LASIX) 20 MG tablet Take 20 mg by mouth daily.    [name] gabapentin (NEURONTIN) 600 MG tablet Take 600 mg by mouth at bedtime.    [name] glucose blood (ONE TOUCH ULTRA TEST) test strip Used to check blood sugars four times daily. 150 each 12  . Insulin Lispro (HUMALOG KWIKPEN [place]) Inject 5 Units into the skin 3 (three) times daily with meals.    . isosorbide mononitrate (IMDUR) 120 MG 24 hr tablet Take 120 mg by mouth daily.    . Lancets (ONETOUCH ULTRASOFT) lancets Used to check blood sugars four times daily. 200 each 12  . meloxicam (MOBIC) 15 MG tablet Take 15 mg by mouth at bedtime.    . Multiple Vitamin (MULTIVITAMIN WITH MINERALS) TABS tablet Take 1 tablet by mouth daily.    [name] omeprazole (PRILOSEC) 40 MG capsule Take 40 mg by mouth 2 (two) times daily.    . ranitidine (ZANTAC) 300 MG tablet Take 300 mg by mouth at bedtime.    [name] spironolactone (ALDACTONE) 50 MG tablet Take 50 mg by mouth daily.    . Vitamin D, Ergocalciferol, (DRISDOL) 50000 units CAPS capsule Take 1 capsule (50,000 Units total) by mouth every 7 (seven) days. 4 capsule 0   No current facility-administered medications for this visit.     Allergies as of 06/16/2018 - Review Complete 06/16/2018  Allergen Reaction Noted  . Statins Shortness  Of Breath 03/25/2018  . Codeine Nausea Only 02/03/2017  . Ibuprofen Other (See Comments) 05/29/2015  . Penicillins Hives and Other (See Comments) 02/03/2017  . Percocet [oxycodone-acetaminophen] Nausea Only 02/03/2017  . Tramadol Nausea Only 02/03/2017  . Vicodin [hydrocodone-acetaminophen] Nausea Only 02/03/2017    Vitals: BP (!) 178/72   Pulse (!) 59   Ht 5' 6"  (1.676 m)   Wt 293 lb (132.9 kg)   BMI 47.29 kg/m  Last Weight:  Wt Readings from Last 1 Encounters:  06/16/18 293 lb (132.9 kg)   ZOX:WRUE mass index is 47.29 kg/m.      Last Height:   Ht Readings from Last 1 Encounters:  06/16/18 5' 6"  (1.676 m)    Physical exam:  General: The patient is awake, alert and appears not in acute distress. The patient is well groomed. Head: Normocephalic, atraumatic. Neck is supple. Mallampati  4 ,  neck circumference:15 " . Nasal airflow patent , Retrognathia is seen. patient wears dentures upper and lower.  Cardiovascular:  Regular rate and rhythm, without  murmurs or carotid bruit, and without distended neck veins. Respiratory: Lungs are clear to auscultation. Skin:  Without evidence of edema, or rash Trunk: BMI is 48. The patient's posture is hunched.    Neurologic exam : The patient is awake and alert, oriented to place and time.   Speech is fluent, Mood and affect are appropriate.  Cranial nerves: Pupils are equal and briskly reactive to light. Funduscopic exam with retinopathy  In the left eye - detachment , laser scars.  evidence of pallor or edema.  Extraocular movements  in vertical and horizontal planes intact and without nystagmus. Visual fields by finger perimetry are intact. Hearing to finger rub intact.  Facial sensation intact to fine touch. Facial motor strength is symmetric and tongue and uvula move midline. Shoulder shrug was symmetrical.   Motor exam:   Normal tone, muscle bulk and symmetric strength in all extremities.  Sensory:  Fine touch, pinprick and vibration were decreased in both feet, numbness with pin and needles.Proprioception tested in the upper extremities was normal. Coordination: Rapid alternating movements in the fingers/hands was normal. Finger-to-nose maneuver  normal without evidence of ataxia, dysmetria or tremor. Gait and station: Patient walks without assistive device and is able unassisted to climb up to the exam table. Strength within normal limits.  Stance is stable and normal.  Deep tendon reflexes: in the upper and lower extremities are symmetrically attenuated -       Assessment:  After physical and neurologic examination, review of laboratory studies,  Personal review of imaging studies, reports of other /same  Imaging studies, results of polysomnography and / or neurophysiology testing and pre-existing records as far as provided in visit., my assessment is   1) Mrs. Vonruden presents with morbid obesity but has made efforts to comply with medical weight management, she only started the program this spring, her last visit had documented a total weight loss of 5 pounds.  Besides her body mass index between 48 and 49, she has other significant risk factors for obstructive sleep apnea that would explain why she feels unrested and unrestored in the morning.  These include anemia, in her case related to chronic kidney disease stage IV, her nephropathy also can leave her more fatigued, iron deficiency, there is a possibility of obesity hypoventilation being present.  She carries a diagnosis of congestive heart failure from age 74, 48 years ago.  Her diabetes is not well controlled on insulin, her  blood pressure is on multiple medications and get she presents today with a systolic blood pressure over 160 mmHg.  Plan We will evaluate the patient for the presence of obstructive sleep apnea which if left untreated can contribute to poor control of diabetes and hypertension, and may explain her fragmented sleep as well.  She reports frequent nocturia in spite of taking her diuretic medication in the morning. Obstructive sleep apnea may contribute to this as well.  She has to drink a lot of fluid in form of water, and may have to reduce her water intake for the last 2 to 3 hours before she goes to bed.  My goal is to obtain a split-night polysomnography at [facility] but my goal is also to see if the patient has hypercapnia or hypoxemia.  I will asked specifically for this patient not to be scheduled in a night for the capnography machine is not present or already used by a  patient.  She has orthopnea and sleeps better reclined or in a seated position.  The sleep lab may provide a wedge and pillows extra for her.  The patient was advised of the nature of the diagnosed disorder , the treatment options and the  risks for general health and wellness arising from not treating the condition.   I spent more than 55 minutes of face to face time with the patient.  Greater than 50% of time was spent in counseling and coordination of care. We have discussed the diagnosis and differential and I answered the patient's questions.    Plan:  Treatment plan and additional workup :   Follow up with MD after Sleep study.    Larey Seat, MD 5/36/1443, 1:54 PM  Certified in Neurology by [employer] Certified in [place] by [facility] [address], [place], [place] [address]

## 2018-06-25 ENCOUNTER — Ambulatory Visit (INDEPENDENT_AMBULATORY_CARE_PROVIDER_SITE_OTHER): Payer: Medicare HMO | Admitting: Physician Assistant

## 2018-06-25 VITALS — BP 166/81 | HR 64 | Temp 97.7°F | Ht 66.0 in | Wt 289.0 lb

## 2018-06-25 DIAGNOSIS — Z9189 Other specified personal risk factors, not elsewhere classified: Secondary | ICD-10-CM | POA: Diagnosis not present

## 2018-06-25 DIAGNOSIS — E559 Vitamin D deficiency, unspecified: Secondary | ICD-10-CM | POA: Diagnosis not present

## 2018-06-25 DIAGNOSIS — E7849 Other hyperlipidemia: Secondary | ICD-10-CM | POA: Diagnosis not present

## 2018-06-25 DIAGNOSIS — D6489 Other specified anemias: Secondary | ICD-10-CM | POA: Diagnosis not present

## 2018-06-25 DIAGNOSIS — Z6841 Body Mass Index (BMI) 40.0 and over, adult: Secondary | ICD-10-CM

## 2018-06-25 MED ORDER — VITAMIN D (ERGOCALCIFEROL) 1.25 MG (50000 UNIT) PO CAPS: 50000.0000 [IU] | ORAL_CAPSULE | ORAL | 0 refills | Status: DC

## 2018-06-25 NOTE — Progress Notes (Signed)
 Office: [phone]  /  Fax: [phone]   HPI:   Chief Complaint: OBESITY Jackson is here to discuss her progress with her obesity treatment plan. She is on the Category 2 plan and is following her eating plan approximately 80 % of the time. She states she is walking for 30 minutes 7 times per week. Makeila did well with weight loss. She reports boredom with Category 2 plan. She would like to start journaling.  Her weight is 289 lb (131.1 kg) today and has had a weight loss of 2 pounds over a period of 3 weeks since her last visit. She has lost 12 lbs since starting treatment with us.  Vitamin D Deficiency Janai has a diagnosis of vitamin D deficiency. She is on prescription Vit D and denies nausea, vomiting or muscle weakness.  At risk for osteopenia and osteoporosis Machell is at higher risk of osteopenia and osteoporosis due to vitamin D deficiency.   Anemia Chelcy has a diagnosis of anemia. Last hemoglobin not at goal. She is with fatigue and intermittent shortness of breath. She is on iron supplementation.   Hyperlipidemia Mirjana has hyperlipidemia and has been trying to improve her cholesterol levels with intensive lifestyle modification including a low saturated fat diet, exercise and weight loss. Last level not at goal. She reports that her primary care physician is supposed to prescribe medication and she is waiting to hear from them. She denies any chest pain, claudication or myalgias.  ALLERGIES: Allergies  Allergen Reactions  . Statins Shortness Of Breath    Wheezing, short of breath  . Codeine Nausea Only  . Ibuprofen Other (See Comments)    Reaction:  Raises pts BP  . Penicillins Hives and Other (See Comments)    Has patient had a PCN reaction causing immediate rash, facial/tongue/throat swelling, SOB or lightheadedness with hypotension: No Has patient had a PCN reaction causing severe rash involving mucus membranes or skin necrosis: No Has patient had a PCN reaction  that required hospitalization No Has patient had a PCN reaction occurring within the last 10 years: No If all of the above answers are "NO", then may proceed with Cephalosporin use.  . Percocet [Oxycodone-Acetaminophen] Nausea Only  . Tramadol Nausea Only  . Vicodin [Hydrocodone-Acetaminophen] Nausea Only    MEDICATIONS: Current Outpatient Medications on File Prior to Visit  Medication Sig Dispense Refill  . albuterol (PROVENTIL HFA;VENTOLIN HFA) 108 (90 Base) MCG/ACT inhaler Inhale 1-2 puffs into the lungs every 6 (six) hours as needed for wheezing or shortness of breath.    . amLODipine (NORVASC) 10 MG tablet Take 10 mg by mouth daily.    . aspirin EC 325 MG tablet Take 325 mg by mouth daily.    . Blood Glucose Monitoring Suppl (ONE TOUCH ULTRA 2) w/Device KIT 1 Device by Does not apply route daily. 1 each 0  . cloNIDine (CATAPRES) 0.3 MG tablet Take 0.3 mg by mouth 2 (two) times daily.    . Continuous Blood Gluc Sensor (FREESTYLE LIBRE 14 DAY SENSOR) MISC 1 Device by Does not apply route every 14 (fourteen) days. 6 each 11  . ferrous sulfate 325 (65 FE) MG tablet Take 325 mg by mouth 3 (three) times daily with meals.    . furosemide (LASIX) 20 MG tablet Take 20 mg by mouth daily.    . gabapentin (NEURONTIN) 600 MG tablet Take 600 mg by mouth at bedtime.    . glucose blood (ONE TOUCH ULTRA TEST) test strip Used to check   blood sugars four times daily. 150 each 12  . Insulin Lispro (HUMALOG KWIKPEN [place]) Inject 5 Units into the skin 3 (three) times daily with meals.    . isosorbide mononitrate (IMDUR) 120 MG 24 hr tablet Take 120 mg by mouth daily.    . Lancets (ONETOUCH ULTRASOFT) lancets Used to check blood sugars four times daily. 200 each 12  . meloxicam (MOBIC) 15 MG tablet Take 15 mg by mouth at bedtime.    . Multiple Vitamin (MULTIVITAMIN WITH MINERALS) TABS tablet Take 1 tablet by mouth daily.    [name] omeprazole (PRILOSEC) 40 MG capsule Take 40 mg by mouth 2 (two) times daily.    .  ranitidine (ZANTAC) 300 MG tablet Take 300 mg by mouth at bedtime.    [name] spironolactone (ALDACTONE) 50 MG tablet Take 50 mg by mouth daily.     No current facility-administered medications on file prior to visit.     PAST MEDICAL HISTORY: Past Medical History:  Diagnosis Date  . Allergic rhinitis   . Anemia   . Anxiety   . Back pain   . Bradycardia   . Breast mass    Patient can no longer palpate specific masses but showed tech general area of concern  . CHF (congestive heart failure) ([place])   . CKD (chronic kidney disease)    STAGE 3  . Constipation   . Diabetes mellitus without complication ([place])   . Dyspnea   . GERD (gastroesophageal reflux disease)   . HLD (hyperlipidemia)   . Hyperparathyroidism ([place])   . Hypertension   . Joint pain   . Leg edema   . Legally blind in left eye, as defined in [place]   . Lymphedema   . Onychomycosis     PAST SURGICAL HISTORY: Past Surgical History:  Procedure Laterality Date  . ABDOMINAL HYSTERECTOMY    . BREAST BIOPSY Left 2014   FNA 12:00 position - Negative  . EYE SURGERY      SOCIAL HISTORY: Social History   Tobacco Use  . Smoking status: Never Smoker  . Smokeless tobacco: Never Used  Substance Use Topics  . Alcohol use: No  . Drug use: Never    FAMILY HISTORY: Family History  Problem Relation Age of Onset  . Breast cancer Sister 75  . Diabetes Sister   . Diabetes Mother   . Hypertension Mother   . Hyperlipidemia Mother   . Eating disorder Mother   . Obesity Mother     ROS: Review of Systems  Constitutional: Positive for malaise/fatigue and weight loss.  Respiratory: Positive for shortness of breath.   Cardiovascular: Negative for chest pain and claudication.  Gastrointestinal: Negative for nausea and vomiting.  Musculoskeletal: Negative for myalgias.       Negative muscle weakness    PHYSICAL EXAM: Blood pressure (!) 166/81, pulse 64, temperature 97.7 F (36.5 C), temperature source Oral, height 5' 6"  (1.676 m), weight 289 lb (131.1 kg), SpO2 100 %. Body mass index is 46.65 kg/m. Physical Exam  Constitutional: She is oriented to person, place, and time. She appears well-developed and well-nourished.  Cardiovascular: Normal rate.  Pulmonary/Chest: Effort normal.  Musculoskeletal: Normal range of motion.  Neurological: She is oriented to person, place, and time.  Skin: Skin is warm and dry.  Psychiatric: She has a normal mood and affect. Her behavior is normal.  Vitals reviewed.   RECENT LABS AND TESTS: BMET    Component Value Date/Time   NA 143 03/11/2018 1002  NA 137 11/21/2013 0627   K 4.3 03/11/2018 1002   K 4.6 11/21/2013 0627   CL 107 (H) 03/11/2018 1002   CL 107 11/21/2013 0627   CO2 22 03/11/2018 1002   CO2 24 11/21/2013 0627   GLUCOSE 124 (H) 03/11/2018 1002   GLUCOSE 86 02/03/2017 1119   GLUCOSE 95 11/21/2013 0627   BUN 30 (H) 03/11/2018 1002   BUN 34 (H) 11/21/2013 0627   CREATININE 2.19 (H) 03/11/2018 1002   CREATININE 1.80 (H) 09/09/2014 1220   CALCIUM 8.7 03/11/2018 1002   CALCIUM 8.8 09/09/2014 1220   GFRNONAA 24 (L) 03/11/2018 1002   GFRNONAA 31 (L) 09/09/2014 1220   GFRNONAA 26 (L) 11/21/2013 0627   GFRAA 28 (L) 03/11/2018 1002   GFRAA 38 (L) 09/09/2014 1220   GFRAA 30 (L) 11/21/2013 0627   Lab Results  Component Value Date   HGBA1C 8.5 (A) 05/19/2018   HGBA1C 7.2 02/13/2018   HGBA1C 7.2 12/05/2017   HGBA1C 7.3 10/02/2017   HGBA1C 7.7 (H) 11/20/2013   No results found for: INSULIN CBC    Component Value Date/Time   WBC 6.9 03/11/2018 1002   WBC 7.8 02/03/2017 1119   RBC 3.47 (L) 03/11/2018 1002   RBC 4.26 02/03/2017 1119   HGB 10.0 (L) 03/11/2018 1002   HCT 30.6 (L) 03/11/2018 1002   PLT 230 02/03/2017 1119   PLT 210 09/09/2014 1220   MCV 88 03/11/2018 1002   MCV 87 09/09/2014 1220   MCH 28.8 03/11/2018 1002   MCH 28.2 02/03/2017 1119   MCHC 32.7 03/11/2018 1002   MCHC 32.4 02/03/2017 1119   RDW 14.4 03/11/2018 1002   RDW 15.1  (H) 09/09/2014 1220   LYMPHSABS 2.0 03/11/2018 1002   LYMPHSABS 2.0 09/09/2014 1220   MONOABS 0.4 02/03/2017 1119   MONOABS 0.5 09/09/2014 1220   EOSABS 0.1 03/11/2018 1002   EOSABS 0.4 09/09/2014 1220   BASOSABS 0.0 03/11/2018 1002   BASOSABS 0.1 09/09/2014 1220   Iron/TIBC/Ferritin/ %Sat    Component Value Date/Time   IRON 39 (L) 10/07/2014 1527   TIBC 202 (L) 10/07/2014 1527   FERRITIN 176 08/04/2014 1527   IRONPCTSAT 19 10/07/2014 1527   Lipid Panel     Component Value Date/Time   CHOL 209 (H) 03/11/2018 1002   CHOL 157 11/20/2013 0954   TRIG 107 03/11/2018 1002   TRIG 114 11/20/2013 0954   HDL 67 03/11/2018 1002   HDL 38 (L) 11/20/2013 0954   VLDL 23 11/20/2013 0954   LDLCALC 121 (H) 03/11/2018 1002   LDLCALC 96 11/20/2013 0954   Hepatic Function Panel     Component Value Date/Time   PROT 6.7 03/11/2018 1002   PROT 7.9 11/19/2013 0934   ALBUMIN 3.7 03/11/2018 1002   ALBUMIN 3.2 (L) 11/19/2013 0934   AST 17 03/11/2018 1002   AST 19 11/19/2013 0934   ALT 14 03/11/2018 1002   ALT 20 11/19/2013 0934   ALKPHOS 143 (H) 03/11/2018 1002   ALKPHOS 95 11/19/2013 0934   BILITOT 0.3 03/11/2018 1002   BILITOT 0.3 11/19/2013 0934      Component Value Date/Time   TSH 1.170 03/11/2018 1002   TSH 1.42 11/20/2013 0954  Results for Santillanes, Arieona J (MRN [id number]) as of 06/25/2018 11:37  Ref. Range 03/11/2018 10:02  Vitamin D, 25-Hydroxy Latest Ref Range: 30.0 - 100.0 ng/mL 21.4 (L)    ASSESSMENT AND PLAN: Vitamin D deficiency - Plan: Comprehensive metabolic panel, VITAMIN D 25 Hydroxy (Vit-D Deficiency,   Fractures), Vitamin D, Ergocalciferol, (DRISDOL) 50000 units CAPS capsule  Anemia due to other cause, not classified - Plan: CBC With Differential  Other hyperlipidemia - Plan: Lipid Panel With LDL/HDL Ratio  At risk for osteoporosis  Class 3 severe obesity with serious comorbidity and body mass index (BMI) of 45.0 to 49.9 in adult, unspecified obesity type  (HCC)  PLAN:  Vitamin D Deficiency Cierra was informed that low vitamin D levels contributes to fatigue and are associated with obesity, breast, and colon cancer. Laycie agrees to continue taking prescription Vit D @50,000 IU every week #4 and we will refill for 1 month. She will follow up for routine testing of vitamin D, at least 2-3 times per year. She was informed of the risk of over-replacement of vitamin D and agrees to not increase her dose unless she discusses this with us first. We will check labs and Baneen agrees to follow up with our clinic in 4 weeks.  At risk for osteopenia and osteoporosis Joyous is at risk for osteopenia and osteoporsis due to her vitamin D deficiency. She was encouraged to take her vitamin D and follow her higher calcium diet and increase strengthening exercise to help strengthen her bones and decrease her risk of osteopenia and osteoporosis.  Anemia The diagnosis of Iron deficiency anemia was discussed with [place] and was explained in detail. She was given suggestions of iron rich foods and and iron supplement was not prescribed. Aprill agrees to continue with ferrous sulfate and we will check labs. Shaniya agrees to follow up with our clinic in 4 weeks.  Hyperlipidemia Nishi was informed of the American Heart Association Guidelines emphasizing intensive lifestyle modifications as the first line treatment for hyperlipidemia. We discussed many lifestyle modifications today in depth, and Gaelle will continue to work on decreasing saturated fats such as fatty red meat, butter and many fried foods. She will also increase vegetables and lean protein in her diet and continue to work on diet, exercise, and weight loss efforts. We will check labs and Scarlettrose agrees to follow up with our clinic in 4 weeks.  Obesity Mckynleigh is currently in the action stage of change. As such, her goal is to continue with weight loss efforts She has agreed to change to keep a food journal with 1100-1200  calories and 80 grams of protein daily Rudean has been instructed to work up to a goal of 150 minutes of combined cardio and strengthening exercise per week for weight loss and overall health benefits. We discussed the following Behavioral Modification Strategies today: work on meal planning and easy cooking plans and ways to avoid boredom eating   Alanya has agreed to follow up with our clinic in 4 weeks. She was informed of the importance of frequent follow up visits to maximize her success with intensive lifestyle modifications for her multiple health conditions.   OBESITY BEHAVIORAL INTERVENTION VISIT  Today's visit was # 6.  Starting weight: 301 lbs Starting date: 03/11/18 Today's weight : 289 lbs  Today's date: 06/25/2018 Total lbs lost to date: 12    ASK: We discussed the diagnosis of obesity with Ivanell J Deluna today and Clancy agreed to give us permission to discuss obesity behavioral modification therapy today.  ASSESS: Kanani has the diagnosis of obesity and her BMI today is 46.67 Kamie is in the action stage of change   ADVISE: Annette was educated on the multiple health risks of obesity as well as the benefit of weight loss to improve her health.   She was advised of the need for long term treatment and the importance of lifestyle modifications.  AGREE: Multiple dietary modification options and treatment options were discussed and  Teneisha agreed to the above obesity treatment plan.  Wilhemena Durie, am acting as transcriptionist for Abby Potash, PA-C I, Abby Potash, PA-C have reviewed above note and agree with its content

## 2018-06-26 LAB — COMPREHENSIVE METABOLIC PANEL
ALT: 10 IU/L (ref 0–32)
AST: 13 IU/L (ref 0–40)
Albumin/Globulin Ratio: 1.2 (ref 1.2–2.2)
Albumin: 3.5 g/dL (ref 3.5–5.5)
Alkaline Phosphatase: 124 IU/L — ABNORMAL HIGH (ref 39–117)
BUN/Creatinine Ratio: 17 (ref 9–23)
BUN: 43 mg/dL — ABNORMAL HIGH (ref 6–24)
Bilirubin Total: 0.3 mg/dL (ref 0.0–1.2)
CO2: 21 mmol/L (ref 20–29)
Calcium: 9 mg/dL (ref 8.7–10.2)
Chloride: 103 mmol/L (ref 96–106)
Creatinine, Ser: 2.59 mg/dL — ABNORMAL HIGH (ref 0.57–1.00)
GFR calc Af Amer: 23 mL/min/{1.73_m2} — ABNORMAL LOW (ref >59)
GFR calc non Af Amer: 20 mL/min/{1.73_m2} — ABNORMAL LOW (ref >59)
Globulin, Total: 2.9 g/dL (ref 1.5–4.5)
Glucose: 248 mg/dL — ABNORMAL HIGH (ref 65–99)
Potassium: 5.6 mmol/L — ABNORMAL HIGH (ref 3.5–5.2)
Sodium: 139 mmol/L (ref 134–144)
Total Protein: 6.4 g/dL (ref 6.0–8.5)

## 2018-06-26 LAB — CBC WITH DIFFERENTIAL
Basophils Absolute: 0.1 10*3/uL (ref 0.0–0.2)
Basos: 1 %
EOS (ABSOLUTE): 0.2 10*3/uL (ref 0.0–0.4)
Eos: 3 %
Hematocrit: 30.3 % — ABNORMAL LOW (ref 34.0–46.6)
Hemoglobin: 9.7 g/dL — ABNORMAL LOW (ref 11.1–15.9)
Immature Grans (Abs): 0 10*3/uL (ref 0.0–0.1)
Immature Granulocytes: 0 %
Lymphocytes Absolute: 1.9 10*3/uL (ref 0.7–3.1)
Lymphs: 31 %
MCH: 28 pg (ref 26.6–33.0)
MCHC: 32 g/dL (ref 31.5–35.7)
MCV: 87 fL (ref 79–97)
Monocytes Absolute: 0.4 10*3/uL (ref 0.1–0.9)
Monocytes: 6 %
Neutrophils Absolute: 3.6 10*3/uL (ref 1.4–7.0)
Neutrophils: 59 %
RBC: 3.47 x10E6/uL — ABNORMAL LOW (ref 3.77–5.28)
RDW: 14 % (ref 12.3–15.4)
WBC: 6.1 10*3/uL (ref 3.4–10.8)

## 2018-06-26 LAB — LIPID PANEL WITH LDL/HDL RATIO
Cholesterol, Total: 191 mg/dL (ref 100–199)
HDL: 47 mg/dL (ref >39)
LDL Calculated: 123 mg/dL — ABNORMAL HIGH (ref 0–99)
LDl/HDL Ratio: 2.6 ratio (ref 0.0–3.2)
Triglycerides: 104 mg/dL (ref 0–149)
VLDL Cholesterol Cal: 21 mg/dL (ref 5–40)

## 2018-06-26 LAB — VITAMIN D 25 HYDROXY (VIT D DEFICIENCY, FRACTURES): Vit D, 25-Hydroxy: 28.5 ng/mL — ABNORMAL LOW (ref 30.0–100.0)

## 2018-07-01 ENCOUNTER — Ambulatory Visit (INDEPENDENT_AMBULATORY_CARE_PROVIDER_SITE_OTHER): Payer: Medicare HMO | Admitting: Podiatry

## 2018-07-01 ENCOUNTER — Ambulatory Visit (INDEPENDENT_AMBULATORY_CARE_PROVIDER_SITE_OTHER): Payer: Medicare HMO

## 2018-07-01 ENCOUNTER — Telehealth: Payer: Self-pay | Admitting: *Deleted

## 2018-07-01 ENCOUNTER — Encounter: Payer: Self-pay | Admitting: Podiatry

## 2018-07-01 ENCOUNTER — Other Ambulatory Visit: Payer: Self-pay | Admitting: Podiatry

## 2018-07-01 VITALS — Ht 66.0 in | Wt 291.0 lb

## 2018-07-01 DIAGNOSIS — M779 Enthesopathy, unspecified: Secondary | ICD-10-CM

## 2018-07-01 DIAGNOSIS — M79672 Pain in left foot: Secondary | ICD-10-CM

## 2018-07-01 DIAGNOSIS — M14672 Charcot's joint, left ankle and foot: Secondary | ICD-10-CM

## 2018-07-01 DIAGNOSIS — M7752 Other enthesopathy of left foot: Secondary | ICD-10-CM | POA: Diagnosis not present

## 2018-07-01 MED ORDER — TRIAMCINOLONE ACETONIDE 10 MG/ML IJ SUSP
10.0000 mg | Freq: Once | INTRAMUSCULAR | Status: AC
  Administered 2018-07-01: 10 mg

## 2018-07-01 NOTE — Telephone Encounter (Signed)
Copied from [place] [phone]. Topic: General - Other >> Jul 01, 2018  3:16 PM Rachel Holmes wrote:  Pt said her knee is hurting worse after getting the injection on 06/05/18 and she is asking if she need to come earlier than her appt she has scheduled in Nov 2019

## 2018-07-02 NOTE — Telephone Encounter (Signed)
likely flare.  See how she is feeling tomorrow and have her call again  Ice a lot

## 2018-07-02 NOTE — Telephone Encounter (Signed)
Spoke to pt, scheduled her 9.4.19.

## 2018-07-03 NOTE — Progress Notes (Signed)
Subjective:   Patient ID: Rachel Holmes, female   DOB: 57 y.o.   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  Patient's sugar is under recently good control with last A1c 8.5 and patient does not smoke and likes to be active and has had chest chronic pain with this   Review of Systems  All other systems reviewed and are negative.       Objective:  Physical Exam  Constitutional: She appears well-developed and well-nourished.  Cardiovascular: Intact distal pulses.  Pulmonary/Chest: Effort normal.  Musculoskeletal: Normal range of motion.  Neurological: She is alert.  Skin: Skin is warm.  Nursing note and vitals reviewed.   Neurovascular status was found to be adequate with patient found to have exquisite discomfort in the sinus tarsi left severe depression of the arch with pain in the medial ankle secondary to the collapse of the arch with patient noted to have diminished range of motion of the subtalar midtarsal joint left     Assessment:  Charcot structure left with inflammation and pain of the sinus tarsi and probable consolidation as the area is not that significantly warm current     Plan:  I reviewed this condition at great length and I do think the best chance this patient has currently is with some form of AFO bracing and I am referring her to ped orthotist for this.  I did inject the sinus tarsi 3 mg Kenalog 5 Milgram Xylocaine discussed this case with Dr. Carman Ching and we are in agreement that some form of medial column fusion eventually could be of benefit to her but to try bracing first  X-ray indicates that there is significant collapse of medial longitudinal arch left with multiple signs of Charcot foot structure with the right showing  adequate

## 2018-07-07 ENCOUNTER — Ambulatory Visit (INDEPENDENT_AMBULATORY_CARE_PROVIDER_SITE_OTHER): Payer: Medicare HMO | Admitting: Neurology

## 2018-07-07 DIAGNOSIS — Z6841 Body Mass Index (BMI) 40.0 and over, adult: Secondary | ICD-10-CM

## 2018-07-07 DIAGNOSIS — R0602 Shortness of breath: Secondary | ICD-10-CM

## 2018-07-07 DIAGNOSIS — G4710 Hypersomnia, unspecified: Secondary | ICD-10-CM | POA: Diagnosis not present

## 2018-07-07 DIAGNOSIS — G4719 Other hypersomnia: Secondary | ICD-10-CM

## 2018-07-07 DIAGNOSIS — E662 Morbid (severe) obesity with alveolar hypoventilation: Secondary | ICD-10-CM

## 2018-07-07 DIAGNOSIS — IMO0002 Reserved for concepts with insufficient information to code with codable children: Secondary | ICD-10-CM

## 2018-07-07 DIAGNOSIS — E0865 Diabetes mellitus due to underlying condition with hyperglycemia: Secondary | ICD-10-CM

## 2018-07-07 DIAGNOSIS — N184 Chronic kidney disease, stage 4 (severe): Secondary | ICD-10-CM

## 2018-07-07 DIAGNOSIS — I129 Hypertensive chronic kidney disease with stage 1 through stage 4 chronic kidney disease, or unspecified chronic kidney disease: Secondary | ICD-10-CM

## 2018-07-07 DIAGNOSIS — E08319 Diabetes mellitus due to underlying condition with unspecified diabetic retinopathy without macular edema: Secondary | ICD-10-CM

## 2018-07-08 ENCOUNTER — Ambulatory Visit: Payer: Medicare HMO | Admitting: Family Medicine

## 2018-07-08 ENCOUNTER — Encounter: Payer: Self-pay | Admitting: Family Medicine

## 2018-07-08 ENCOUNTER — Ambulatory Visit: Payer: Medicare HMO | Admitting: Orthotics

## 2018-07-08 DIAGNOSIS — M1711 Unilateral primary osteoarthritis, right knee: Secondary | ICD-10-CM

## 2018-07-08 DIAGNOSIS — M14672 Charcot's joint, left ankle and foot: Secondary | ICD-10-CM

## 2018-07-08 DIAGNOSIS — M79672 Pain in left foot: Secondary | ICD-10-CM

## 2018-07-08 NOTE — Progress Notes (Signed)
[name] [address], [place] [address] Phone: [phone] Subjective:   Fontaine No, am serving as a scribe for Dr. Hulan Saas.     CC: Right knee pain follow-up  SFK:[id number]  Rachel Holmes is a 57 y.o. female coming in with complaint of right knee pain. Pain over the patella and on medial aspect of knee. Denies falling. Painful when walking both dull and sharp sensations. Has been using icy hot.  Known arthritic changes.  Patient is having increasing discomfort and pain overall.  Increasing instability as well.  Feels like it can give out on her from time to time. Failed all other conservative therapy.    Past Medical History:  Diagnosis Date  . Allergic rhinitis   . Anemia   . Anxiety   . Back pain   . Bradycardia   . Breast mass    Patient can no longer palpate specific masses but showed tech general area of concern  . CHF (congestive heart failure) ([place])   . CKD (chronic kidney disease)    STAGE 3  . Constipation   . Diabetes mellitus without complication ([place])   . Dyspnea   . GERD (gastroesophageal reflux disease)   . HLD (hyperlipidemia)   . Hyperparathyroidism ([place])   . Hypertension   . Joint pain   . Leg edema   . Legally blind in left eye, as defined in [place]   . Lymphedema   . Onychomycosis    Past Surgical History:  Procedure Laterality Date  . ABDOMINAL HYSTERECTOMY    . BREAST BIOPSY Left 2014   FNA 12:00 position - Negative  . EYE SURGERY     Social History   Socioeconomic History  . Marital status: Divorced    Spouse name: Not on file  . Number of children: Not on file  . Years of education: Not on file  . Highest education level: Not on file  Occupational History  . Occupation: Glass blower/designer  Social Needs  . Financial resource strain: Not on file  . Food insecurity:    Worry: Not on file    Inability: Not on file  . Transportation needs:    Medical: Not on file    Non-medical: Not  on file  Tobacco Use  . Smoking status: Never Smoker  . Smokeless tobacco: Never Used  Substance and Sexual Activity  . Alcohol use: No  . Drug use: Never  . Sexual activity: Not on file  Lifestyle  . Physical activity:    Days per week: Not on file    Minutes per session: Not on file  . Stress: Not on file  Relationships  . Social connections:    Talks on phone: Not on file    Gets together: Not on file    Attends religious service: Not on file    Active member of club or organization: Not on file    Attends meetings of clubs or organizations: Not on file    Relationship status: Not on file  Other Topics Concern  . Not on file  Social History Narrative   ** Merged History Encounter **       Allergies  Allergen Reactions  . Statins Shortness Of Breath    Wheezing, short of breath  . Codeine Nausea Only  . Ibuprofen Other (See Comments)    Reaction:  Raises pts BP  . Penicillins Hives and Other (See Comments)    Has patient had  a PCN reaction causing immediate rash, facial/tongue/throat swelling, SOB or lightheadedness with hypotension: No Has patient had a PCN reaction causing severe rash involving mucus membranes or skin necrosis: No Has patient had a PCN reaction that required hospitalization No Has patient had a PCN reaction occurring within the last 10 years: No If all of the above answers are "NO", then may proceed with Cephalosporin use.  [name] Percocet [Oxycodone-Acetaminophen] Nausea Only  . Tramadol Nausea Only  . Vicodin [Hydrocodone-Acetaminophen] Nausea Only   Family History  Problem Relation Age of Onset  . Breast cancer Sister 69  . Diabetes Sister   . Diabetes Mother   . Hypertension Mother   . Hyperlipidemia Mother   . Eating disorder Mother   . Obesity Mother     Current Outpatient Medications (Endocrine & Metabolic):  [name]  Insulin Lispro (HUMALOG KWIKPEN [place]), Inject 5 Units into the skin 3 (three) times daily with meals.  Current Outpatient  Medications (Cardiovascular):  .  amLODipine (NORVASC) 10 MG tablet, Take 10 mg by mouth daily. .  cloNIDine (CATAPRES) 0.3 MG tablet, Take 0.3 mg by mouth 2 (two) times daily. .  furosemide (LASIX) 20 MG tablet, Take 20 mg by mouth daily. .  isosorbide mononitrate (IMDUR) 120 MG 24 hr tablet, Take 120 mg by mouth daily. [name]  spironolactone (ALDACTONE) 50 MG tablet, Take 50 mg by mouth daily.  Current Outpatient Medications (Respiratory):  .  albuterol (PROVENTIL HFA;VENTOLIN HFA) 108 (90 Base) MCG/ACT inhaler, Inhale 1-2 puffs into the lungs every 6 (six) hours as needed for wheezing or shortness of breath.  Current Outpatient Medications (Analgesics):  .  aspirin EC 325 MG tablet, Take 325 mg by mouth daily. .  meloxicam (MOBIC) 15 MG tablet, Take 15 mg by mouth at bedtime.  Current Outpatient Medications (Hematological):  .  ferrous sulfate 325 (65 FE) MG tablet, Take 325 mg by mouth 3 (three) times daily with meals.  Current Outpatient Medications (Other):  .  Blood Glucose Monitoring Suppl (ONE TOUCH ULTRA 2) w/Device KIT, 1 Device by Does not apply route daily. .  Continuous Blood Gluc Sensor (FREESTYLE LIBRE 14 DAY SENSOR) MISC, 1 Device by Does not apply route every 14 (fourteen) days. [name]  gabapentin (NEURONTIN) 600 MG tablet, Take 600 mg by mouth at bedtime. [name]  glucose blood (ONE TOUCH ULTRA TEST) test strip, Used to check blood sugars four times daily. .  Lancets (ONETOUCH ULTRASOFT) lancets, Used to check blood sugars four times daily. .  Multiple Vitamin (MULTIVITAMIN WITH MINERALS) TABS tablet, Take 1 tablet by mouth daily. [name]  omeprazole (PRILOSEC) 40 MG capsule, Take 40 mg by mouth 2 (two) times daily. .  ranitidine (ZANTAC) 300 MG tablet, Take 300 mg by mouth at bedtime. .  Vitamin D, Ergocalciferol, (DRISDOL) 50000 units CAPS capsule, Take 1 capsule (50,000 Units total) by mouth every 7 (seven) days.    Past medical history, social, surgical and family history all reviewed  in electronic medical record.  No pertanent information unless stated regarding to the chief complaint.   Review of Systems:  No headache, visual changes, nausea, vomiting, diarrhea, constipation, dizziness, abdominal pain, skin rash, fevers, chills, night sweats, weight loss, swollen lymph nodes, body aches, joint swelling,chest pain, shortness of breath, mood changes.  Positive muscle aches  Objective  Blood pressure 118/72, pulse 70, height 5' 6" (1.676 m), weight 298 lb (135.2 kg), SpO2 98 %.    General: No apparent distress alert and oriented x3 mood and affect  normal, dressed appropriately.  HEENT: Pupils equal, extraocular movements intact  Respiratory: Patient's speak in full sentences and does not appear short of breath  Cardiovascular: No lower extremity edema, non tender, no erythema  Skin: Warm dry intact with no signs of infection or rash on extremities or on axial skeleton.  Abdomen: Soft nontender  Neuro: Cranial nerves II through XII are intact, neurovascularly intact in all extremities with 2+ DTRs and 2+ pulses.  Lymph: No lymphadenopathy of posterior or anterior cervical chain or axillae bilaterally.  Gait antalgic gait MSK:  tender with full range of motion and good stability and symmetric strength and tone of shoulders, elbows, wrist, hip, and ankles bilaterally.  Knee: Right valgus deformity noted. Large thigh to calf ratio.  Tender to palpation over medial and PF joint line.  ROM full in flexion and extension and lower leg rotation. instability with valgus force.  painful patellar compression. Patellar glide with moderate crepitus. Patellar and quadriceps tendons unremarkable. Hamstring and quadriceps strength is normal. Contralateral knee shows degenerative changes with minimal discomfort and mild instability but not as severe as the right side   After informed written and verbal consent, patient was seated on exam table. Right knee was prepped with alcohol swab  and utilizing anterolateral approach, patient's right knee space was injected with 22 mg/mL  of Norvasc (sodium hyaluronate) in a prefilled syringe was injected easily into the knee through a 22-gauge needle..Patient tolerated the procedure well without immediate complications. Impression and Recommendations:     This case required medical decision making of moderate complexity. The above documentation has been reviewed and is accurate and complete Lyndal Pulley, DO       Note: This dictation was prepared with Dragon dictation along with smaller phrase technology. Any transcriptional errors that result from this process are unintentional.

## 2018-07-08 NOTE — Assessment & Plan Note (Signed)
Monovisc given today.  Tolerated the procedure well.  Did have prior approval from insurance company.  Discussed icing regimen and home exercises.  Discussed the possibility of repeating injections again.  Follow-up again with me 4 to 6 weeks

## 2018-07-08 NOTE — Patient Instructions (Signed)
Good to see you  Did the monovisc today  Ice is your friend Will take about a month for the knee injection to work well  They will call you on the brace See me again in 4-6 weeks

## 2018-07-15 DIAGNOSIS — G4719 Other hypersomnia: Secondary | ICD-10-CM | POA: Insufficient documentation

## 2018-07-15 DIAGNOSIS — N185 Chronic kidney disease, stage 5: Secondary | ICD-10-CM | POA: Insufficient documentation

## 2018-07-15 DIAGNOSIS — I129 Hypertensive chronic kidney disease with stage 1 through stage 4 chronic kidney disease, or unspecified chronic kidney disease: Secondary | ICD-10-CM | POA: Insufficient documentation

## 2018-07-15 DIAGNOSIS — N184 Chronic kidney disease, stage 4 (severe): Secondary | ICD-10-CM | POA: Insufficient documentation

## 2018-07-15 DIAGNOSIS — N186 End stage renal disease: Secondary | ICD-10-CM | POA: Insufficient documentation

## 2018-07-15 DIAGNOSIS — Z6841 Body Mass Index (BMI) 40.0 and over, adult: Secondary | ICD-10-CM

## 2018-07-15 NOTE — Procedures (Signed)
PATIENT'S NAME:  Rachel Holmes, Rachel Holmes DOB:      30-Aug-1961      MR#:    [id number]     DATE OF RECORDING: 07/07/2018 REFERRING M.D.:  Dennard Nip, MD Study Performed:   Baseline Polysomnogram HISTORY:  I have the pleasure of meeting Rachel Holmes today, a 57 year old female patient of Dr. [facility]gie [facility]. She has been referred to evaluate her for possible OSA as this could be a hindrance is achieving weight loss and HTN control. This in addition to several other conditions such as a History of CHF, CKD 4.  She is also diagnosed with vitamin D deficiency, with iron deficiency anemia of chronic disease, and described orthopnea and frequent nocturia.   The patient endorsed the Epworth Sleepiness Scale at 11/24 points.   The patient's weight 293 pounds with a height of 66 (inches), resulting in a BMI of 47.1 kg/m2. The patient's neck circumference measured 15 inches.  CURRENT MEDICATIONS: Proventil, Norvasc, Aspirin, Catapres, Vibra-tabs, Ferrous sulfate, Lasix, Neurontin, Humalog, ImDur, Mobic, Multivitamin, Prilosec, Zantac, Aldactone, Drisdol.   PROCEDURE:  This is a multichannel digital polysomnogram utilizing the Somnostar 11.2 system.  Electrodes and sensors were applied and monitored per AASM Specifications.   EEG, EOG, Chin and Limb EMG, were sampled at 200 Hz.  ECG, Snore and Nasal Pressure, Thermal Airflow, Respiratory Effort, CPAP Flow and Pressure, Oximetry was sampled at 50 Hz. Digital video and audio were recorded.      BASELINE STUDY: Lights Out was at 22:20 and Lights On at 05:01.  Total recording time (TRT) was 401.5 minutes, with a total sleep time (TST) of 368 minutes. The patient's sleep latency was 0 minutes.  REM latency was 137.5 minutes.  The sleep efficiency was 91.7 %.     SLEEP ARCHITECTURE: WASO (Wake after sleep onset) was 33 minutes.  There were 6 minutes in Stage N1, 176.5 minutes Stage N2, 141 minutes Stage N3 and 44.5 minutes in Stage  REM.  The percentage of Stage N1 was 1.6%, Stage N2 was 48.%, Stage N3 was 38.3% and Stage R (REM sleep) was 12.1%.  RESPIRATORY ANALYSIS:  There were a total of 26 respiratory events:  6 obstructive apneas, 0 central apneas and 0 mixed apneas with a total of 6 apneas and an apnea index (AI) of 1. /hour. There were 20 hypopneas with a hypopnea index of 3.3 /hour. The patient also had 0 respiratory event related arousals (RERAs).The total APNEA/HYPOPNEA INDEX (AHI) was 4.2 /hour and the total RESPIRATORY DISTURBANCE INDEX was 4.2 /hour. 18 events occurred in REM sleep and 10 events in NREM. The REM AHI was 24.3 /hour, versus a non-REM AHI of 1.5. The patient spent 63 minutes of total sleep time in the supine position and 305 minutes in non-supine. The supine AHI was 0.0/h versus a non-supine AHI of 5.1.  OXYGEN SATURATION & C02:  The Wake baseline 02 saturation was 0%, with the lowest being 84%. Time spent below 89% saturation equaled 11 minutes.  Average End Tidal CO2 during sleep was 45 torr- Patient's peak CO2 retention level in NREM sleep was 57 torr and peak C02 in REM sleep was 53 torr. Time above 50 torr was 2 minutes.   AROUSALS/ PERIODIC LIMB MOVEMENTS:   The patient had a total of 83 Periodic Limb Movements.  The Periodic Limb Movement (PLM) index was 13.5 and the PLM Arousal index was 0.7/hour. The arousals were noted as: 23 were spontaneous, 4 were associated  with PLMs, and 1 was associated with respiratory events. Audio and video analysis did not show any abnormal or unusual movements, behaviors, phonations or vocalizations. The patient had nocturia twice.  She snored. PSG EEG was normal.  EKG was in keeping with normal sinus rhythm (NSR).  *Post-study, the patient indicated that sleep was the same as usual.    IMPRESSION:  1. Clinically irrelevant degree of Obstructive Sleep Apnea (OSA) with AHI below 5/h, but accentuated in REM sleep -REM AHI was 24/h. 2. Brief periods of hypoxemia  and hypercapnia associated with REM sleep. 3. Frequent Periodic Limb Movements (PLM) without significant arousals. 4. Primary Snoring  RECOMMENDATIONS:  1. This mild degree of sleep apnea would not qualify the patient for CPAP use, but intermittent hypoxemia and hypercapnia in REM sleep are indicators for hypoventilation, and will most likely improve with further weight loss. 2. Sleeping with the head of bed elevated or while using a wedge will ease breathing function. 3. Snoring can be treated by a dental device should the patient find this symptom especially bothersome.  4. If PLMs do correlate with restless legs symptoms, will need to check on iron, TIBC and ferritin levels.   I certify that I have reviewed the entire raw data recording prior to the issuance of this report in accordance with the Standards of Accreditation of the American Academy of Sleep Medicine (AASM)    Larey Seat, MD      September 11th 2019   Diplomat, American Board of Psychiatry and Neurology  Diplomat, Tax adviser of Sleep Medicine Market researcher, [employer] [facility] at [employer]

## 2018-07-16 ENCOUNTER — Telehealth: Payer: Self-pay | Admitting: Neurology

## 2018-07-16 NOTE — Telephone Encounter (Signed)
-----   Message from Larey Seat, MD sent at 07/15/2018  8:37 AM EDT ----- Patient's study  was not "split" into diagnostic and CPAP therapeutic part as AHI was too low in this study. See above :IMPRESSION:  1. Clinically irrelevant degree of Obstructive Sleep Apnea (OSA)  with AHI below 5/h, but accentuated in REM sleep -REM AHI was  24/h. 2. Brief periods of hypoxemia and hypercapnia associated with REM  sleep. 3. Frequent Periodic Limb Movements (PLM) without significant  arousals. 4. Primary Snoring  RECOMMENDATIONS:  1. This mild degree of sleep apnea would not qualify the patient  for CPAP use, but intermittent hypoxemia and hypercapnia in REM  sleep are indicators for hypoventilation, and will most likely  improve with further weight loss. 2. Sleeping with the head of bed elevated or while using a wedge  will ease breathing function. 3. Snoring can be treated by a dental device should the patient  find this symptom especially bothersome.  4. If PLMs do correlate with restless legs symptoms, will need to  check on iron, TIBC and ferritin levels.   Please make a follow up appointment with [facility]- either MD or NP.

## 2018-07-16 NOTE — Telephone Encounter (Signed)
Patient returned call and was able to review the sleep study with her. Informed her that the sleep study didn't show any significant sleep apnea. Instructed the patient that we noticed restlessness in her extremities. I questioned if this was bothersome for her and the patient stated that not at this time. Advised that if she would like for [name] to complete a work up for that and it becomes bothersome we could. Pt verbalized understanding. At this time she declined and states she would call if needed anything. Pt had no questions at this time but was encouraged to call back if questions arise.

## 2018-07-16 NOTE — Telephone Encounter (Signed)
Called patient to discuss sleep study results. No answer at this time. LVM for the patient to call back.   

## 2018-07-21 ENCOUNTER — Encounter: Payer: Self-pay | Admitting: Endocrinology

## 2018-07-21 ENCOUNTER — Ambulatory Visit (INDEPENDENT_AMBULATORY_CARE_PROVIDER_SITE_OTHER): Payer: Medicare HMO | Admitting: Endocrinology

## 2018-07-21 VITALS — BP 138/72 | HR 70 | Ht 66.0 in | Wt 292.2 lb

## 2018-07-21 DIAGNOSIS — N183 Chronic kidney disease, stage 3 unspecified: Secondary | ICD-10-CM

## 2018-07-21 DIAGNOSIS — Z794 Long term (current) use of insulin: Secondary | ICD-10-CM | POA: Diagnosis not present

## 2018-07-21 DIAGNOSIS — E1122 Type 2 diabetes mellitus with diabetic chronic kidney disease: Secondary | ICD-10-CM

## 2018-07-21 LAB — POCT GLYCOSYLATED HEMOGLOBIN (HGB A1C): Hemoglobin A1C: 10.1 % — AB (ref 4.0–5.6)

## 2018-07-21 MED ORDER — INSULIN LISPRO 100 UNIT/ML (KWIKPEN): 15.0000 [IU] | PEN_INJECTOR | Freq: Three times a day (TID) | SUBCUTANEOUS | 11 refills | Status: DC

## 2018-07-21 NOTE — Patient Instructions (Addendum)
check your blood sugar twice a day.  vary the time of day when you check, between before the 3 meals, and at bedtime.  also check if you have symptoms of your blood sugar being too high or too low.  please keep a record of the readings and bring it to your next appointment here (or you can bring the meter itself).  You can write it on any piece of paper.  please call us sooner if your blood sugar goes below 70, or if you have a lot of readings over 200. I have sent a prescription to your pharmacy, to increase the humalog to 15 units 3 times a day (just before each meal) Please come back for a follow-up appointment in 1 month.

## 2018-07-21 NOTE — Progress Notes (Signed)
Subjective:    Patient ID: Rachel Holmes, female    DOB: 1960-11-26, 57 y.o.   MRN: [id number]  HPI Pt returns for f/u of diabetes mellitus: DM type: Insulin-requiring type 2 Dx'ed: 1988, during a pregnancy, but it persisted after Complications: polyneuropathy, renal failure, leg ulcer, and PDR.   Therapy: insulin since soon after dx GDM: never DKA: never Severe hypoglycemia: 2 episodes (both many years ago).   Pancreatitis: never Pancreatic imaging: normal on 2008 [name] Other: she takes multiple daily injections; she requested to d/c trulicity.   Interval history: She stopped [place], due to nausea.  no cbg record, but states cbg's are in the 200's.  There is no trend throughout the day.   Past Medical History:  Diagnosis Date  . Allergic rhinitis   . Anemia   . Anxiety   . Back pain   . Bradycardia   . Breast mass    Patient can no longer palpate specific masses but showed tech general area of concern  . CHF (congestive heart failure) ([place])   . CKD (chronic kidney disease)    STAGE 3  . Constipation   . Diabetes mellitus without complication ([place])   . Dyspnea   . GERD (gastroesophageal reflux disease)   . HLD (hyperlipidemia)   . Hyperparathyroidism ([place])   . Hypertension   . Joint pain   . Leg edema   . Legally blind in left eye, as defined in [place]   . Lymphedema   . Onychomycosis     Past Surgical History:  Procedure Laterality Date  . ABDOMINAL HYSTERECTOMY    . BREAST BIOPSY Left 2014   FNA 12:00 position - Negative  . EYE SURGERY      Social History   Socioeconomic History  . Marital status: Divorced    Spouse name: Not on file  . Number of children: Not on file  . Years of education: Not on file  . Highest education level: Not on file  Occupational History  . Occupation: Glass blower/designer  Social Needs  . Financial resource strain: Not on file  . Food insecurity:    Worry: Not on file    Inability: Not on file  . Transportation needs:   Medical: Not on file    Non-medical: Not on file  Tobacco Use  . Smoking status: Never Smoker  . Smokeless tobacco: Never Used  Substance and Sexual Activity  . Alcohol use: No  . Drug use: Never  . Sexual activity: Not on file  Lifestyle  . Physical activity:    Days per week: Not on file    Minutes per session: Not on file  . Stress: Not on file  Relationships  . Social connections:    Talks on phone: Not on file    Gets together: Not on file    Attends religious service: Not on file    Active member of club or organization: Not on file    Attends meetings of clubs or organizations: Not on file    Relationship status: Not on file  . Intimate partner violence:    Fear of current or ex partner: Not on file    Emotionally abused: Not on file    Physically abused: Not on file    Forced sexual activity: Not on file  Other Topics Concern  . Not on file  Social History Narrative   ** Merged History Encounter **        Current Outpatient Medications on File  Prior to Visit  Medication Sig Dispense Refill  . albuterol (PROVENTIL HFA;VENTOLIN HFA) 108 (90 Base) MCG/ACT inhaler Inhale 1-2 puffs into the lungs every 6 (six) hours as needed for wheezing or shortness of breath.    [name] amLODipine (NORVASC) 10 MG tablet Take 10 mg by mouth daily.    [name] aspirin EC 325 MG tablet Take 325 mg by mouth daily.    . Blood Glucose Monitoring Suppl (ONE TOUCH ULTRA 2) w/Device KIT 1 Device by Does not apply route daily. 1 each 0  . cloNIDine (CATAPRES) 0.3 MG tablet Take 0.3 mg by mouth 2 (two) times daily.    . Continuous Blood Gluc Sensor (FREESTYLE LIBRE 14 DAY SENSOR) MISC 1 Device by Does not apply route every 14 (fourteen) days. 6 each 11  . ferrous sulfate 325 (65 FE) MG tablet Take 325 mg by mouth 3 (three) times daily with meals.    . furosemide (LASIX) 20 MG tablet Take 20 mg by mouth daily.    [name] gabapentin (NEURONTIN) 600 MG tablet Take 600 mg by mouth at bedtime.    [name] glucose blood (ONE  TOUCH ULTRA TEST) test strip Used to check blood sugars four times daily. 150 each 12  . isosorbide mononitrate (IMDUR) 120 MG 24 hr tablet Take 120 mg by mouth daily.    . Lancets (ONETOUCH ULTRASOFT) lancets Used to check blood sugars four times daily. 200 each 12  . meloxicam (MOBIC) 15 MG tablet Take 15 mg by mouth at bedtime.    . Multiple Vitamin (MULTIVITAMIN WITH MINERALS) TABS tablet Take 1 tablet by mouth daily.    [name] omeprazole (PRILOSEC) 40 MG capsule Take 40 mg by mouth 2 (two) times daily.    . ranitidine (ZANTAC) 300 MG tablet Take 300 mg by mouth at bedtime.    [name] spironolactone (ALDACTONE) 50 MG tablet Take 50 mg by mouth daily.     No current facility-administered medications on file prior to visit.     Allergies  Allergen Reactions  . Statins Shortness Of Breath    Wheezing, short of breath  . Codeine Nausea Only  . Ibuprofen Other (See Comments)    Reaction:  Raises pts BP  . Penicillins Hives and Other (See Comments)    Has patient had a PCN reaction causing immediate rash, facial/tongue/throat swelling, SOB or lightheadedness with hypotension: No Has patient had a PCN reaction causing severe rash involving mucus membranes or skin necrosis: No Has patient had a PCN reaction that required hospitalization No Has patient had a PCN reaction occurring within the last 10 years: No If all of the above answers are "NO", then may proceed with Cephalosporin use.  [name] Percocet [Oxycodone-Acetaminophen] Nausea Only  . Tramadol Nausea Only  . Vicodin [Hydrocodone-Acetaminophen] Nausea Only    Family History  Problem Relation Age of Onset  . Breast cancer Sister 51  . Diabetes Sister   . Diabetes Mother   . Hypertension Mother   . Hyperlipidemia Mother   . Eating disorder Mother   . Obesity Mother     BP 138/72 (BP Location: Left Arm, Patient Position: Sitting)   Pulse 70   Ht 5' 6"  (1.676 m)   Wt 292 lb 3.2 oz (132.5 kg)   SpO2 96%   BMI 47.16 kg/m   Review of  Systems She denies hypoglycemia.  She has regained weight.      Objective:   Physical Exam VITAL SIGNS:  See vs page GENERAL: no distress Pulses:  foot pulses are intact bilaterally.   MSK: chronic deformity left foot/ankle is noted.  CV: 2+ edema of the left leg, and 1+ on the right Skin:  no ulcer on the feet or ankles.  normal color and temp on the feet and ankles.   Neuro: sensation is intact to touch on the feet and ankles, but decreased from normal.    Lab Results  Component Value Date   CREATININE 2.59 (H) 06/25/2018   BUN 43 (H) 06/25/2018   NA 139 06/25/2018   K 5.6 (H) 06/25/2018   CL 103 06/25/2018   CO2 21 06/25/2018   A1c=10.1%    Assessment & Plan:  Insulin-requiring type 2 DM, with PDR: worse.   Obesity: worse Renal failure: in this setting, he does not need basal insulin.    Patient Instructions  check your blood sugar twice a day.  vary the time of day when you check, between before the 3 meals, and at bedtime.  also check if you have symptoms of your blood sugar being too high or too low.  please keep a record of the readings and bring it to your next appointment here (or you can bring the meter itself).  You can write it on any piece of paper.  please call us sooner if your blood sugar goes below 70, or if you have a lot of readings over 200. I have sent a prescription to your pharmacy, to increase the humalog to 15 units 3 times a day (just before each meal) Please come back for a follow-up appointment in 1 month.

## 2018-07-22 ENCOUNTER — Other Ambulatory Visit: Payer: Self-pay

## 2018-07-22 MED ORDER — INSULIN ASPART 100 UNIT/ML FLEXPEN: 15.0000 [IU] | PEN_INJECTOR | Freq: Three times a day (TID) | SUBCUTANEOUS | 11 refills | Status: DC

## 2018-07-23 ENCOUNTER — Ambulatory Visit (INDEPENDENT_AMBULATORY_CARE_PROVIDER_SITE_OTHER): Payer: Medicare HMO | Admitting: Family Medicine

## 2018-07-23 VITALS — BP 170/95 | HR 61 | Temp 98.0°F | Ht 66.0 in | Wt 286.0 lb

## 2018-07-23 DIAGNOSIS — Z794 Long term (current) use of insulin: Secondary | ICD-10-CM

## 2018-07-23 DIAGNOSIS — E119 Type 2 diabetes mellitus without complications: Secondary | ICD-10-CM | POA: Diagnosis not present

## 2018-07-23 DIAGNOSIS — E66813 Obesity, class 3: Secondary | ICD-10-CM

## 2018-07-23 DIAGNOSIS — Z6841 Body Mass Index (BMI) 40.0 and over, adult: Secondary | ICD-10-CM | POA: Diagnosis not present

## 2018-07-23 DIAGNOSIS — E559 Vitamin D deficiency, unspecified: Secondary | ICD-10-CM

## 2018-07-23 MED ORDER — VITAMIN D (ERGOCALCIFEROL) 1.25 MG (50000 UNIT) PO CAPS: 50000.0000 [IU] | ORAL_CAPSULE | ORAL | 0 refills | Status: DC

## 2018-07-25 ENCOUNTER — Encounter (HOSPITAL_COMMUNITY): Payer: Self-pay

## 2018-07-25 ENCOUNTER — Other Ambulatory Visit: Payer: Self-pay

## 2018-07-25 ENCOUNTER — Emergency Department (HOSPITAL_BASED_OUTPATIENT_CLINIC_OR_DEPARTMENT_OTHER): Payer: Medicare HMO

## 2018-07-25 ENCOUNTER — Emergency Department (HOSPITAL_COMMUNITY)
Admission: EM | Admit: 2018-07-25 | Discharge: 2018-07-25 | Disposition: A | Discharge status: Home / Self Care | Payer: Medicare HMO | Attending: Emergency Medicine | Admitting: Emergency Medicine

## 2018-07-25 DIAGNOSIS — N183 Chronic kidney disease, stage 3 (moderate): Secondary | ICD-10-CM | POA: Diagnosis not present

## 2018-07-25 DIAGNOSIS — Z79899 Other long term (current) drug therapy: Secondary | ICD-10-CM | POA: Insufficient documentation

## 2018-07-25 DIAGNOSIS — I130 Hypertensive heart and chronic kidney disease with heart failure and stage 1 through stage 4 chronic kidney disease, or unspecified chronic kidney disease: Secondary | ICD-10-CM | POA: Diagnosis not present

## 2018-07-25 DIAGNOSIS — R600 Localized edema: Secondary | ICD-10-CM | POA: Diagnosis not present

## 2018-07-25 DIAGNOSIS — E785 Hyperlipidemia, unspecified: Secondary | ICD-10-CM | POA: Diagnosis not present

## 2018-07-25 DIAGNOSIS — I509 Heart failure, unspecified: Secondary | ICD-10-CM | POA: Insufficient documentation

## 2018-07-25 DIAGNOSIS — R609 Edema, unspecified: Secondary | ICD-10-CM

## 2018-07-25 DIAGNOSIS — Z794 Long term (current) use of insulin: Secondary | ICD-10-CM | POA: Diagnosis not present

## 2018-07-25 DIAGNOSIS — R2242 Localized swelling, mass and lump, left lower limb: Secondary | ICD-10-CM | POA: Diagnosis present

## 2018-07-25 DIAGNOSIS — Z7982 Long term (current) use of aspirin: Secondary | ICD-10-CM | POA: Insufficient documentation

## 2018-07-25 DIAGNOSIS — E1122 Type 2 diabetes mellitus with diabetic chronic kidney disease: Secondary | ICD-10-CM | POA: Diagnosis not present

## 2018-07-25 MED ORDER — HYDROCODONE-ACETAMINOPHEN 5-325 MG PO TABS
1.0000 | ORAL_TABLET | Freq: Once | ORAL | Status: AC
  Administered 2018-07-25: 1 via ORAL
  Filled 2018-07-25: qty 1

## 2018-07-25 MED ORDER — ONDANSETRON 4 MG PO TBDP
4.0000 mg | ORAL_TABLET | Freq: Once | ORAL | Status: AC
  Administered 2018-07-25: 4 mg via ORAL
  Filled 2018-07-25: qty 1

## 2018-07-25 NOTE — ED Triage Notes (Signed)
Pt reports L leg swelling that started today. She reports that she has had the swelling before, but this time is very painful. She denies N/V/D. Endorses a hx of diabetes. A&Ox4.

## 2018-07-25 NOTE — Discharge Instructions (Addendum)
Call your doctor and advised them of this issue and see if they want to do any changes with your medications.  Return here as needed.

## 2018-07-25 NOTE — ED Provider Notes (Signed)
[facility] DEPT Provider Note   CSN: [id number] Arrival date & time: 07/25/18  1537     History   Chief Complaint Chief Complaint  Patient presents with  . Leg Swelling    L    HPI Rachel Holmes is a 57 y.o. female.  HPI Patient presents to the emergency department with left leg swelling that has been ongoing for quite a while but she states seems worse today.  Patient states that she has had swelling in this leg for quite a while.  She states that nothing seems to make the condition better but certain movements palpation make the pain worse.  Patient states she will get a tingling sensation and also pain at other times.  Patient states that does not have any other symptoms at this time.  The patient denies chest pain, shortness of breath, headache,blurred vision, neck pain, fever, cough, weakness, numbness, dizziness, anorexia, edema, abdominal pain, nausea, vomiting, diarrhea, rash, back pain, dysuria, hematemesis, bloody stool, near syncope, or syncope. Past Medical History:  Diagnosis Date  . Allergic rhinitis   . Anemia   . Anxiety   . Back pain   . Bradycardia   . Breast mass    Patient can no longer palpate specific masses but showed tech general area of concern  . CHF (congestive heart failure) ([place])   . CKD (chronic kidney disease)    STAGE 3  . Constipation   . Diabetes mellitus without complication ([place])   . Dyspnea   . GERD (gastroesophageal reflux disease)   . HLD (hyperlipidemia)   . Hyperparathyroidism ([place])   . Hypertension   . Joint pain   . Leg edema   . Legally blind in left eye, as defined in [place]   . Lymphedema   . Onychomycosis     Patient Active Problem List   Diagnosis Date Noted  . Malignant hypertension (arteriolar nephrosclerosis), stage 1-4 or unspecified chronic kidney disease 07/15/2018  . CKD (chronic kidney disease) stage 4, GFR 15-29 ml/min (HCC) 07/15/2018  . Morbid obesity with BMI of 45.0-49.9,  adult ([place]) 07/15/2018  . Excessive daytime sleepiness 07/15/2018  . Other fatigue 03/11/2018  . Shortness of breath on exertion 03/11/2018  . Essential hypertension 03/11/2018  . Vitamin D deficiency 03/11/2018  . Congestive heart failure ([place]) 03/11/2018  . Degenerative arthritis of right knee 01/06/2018  . Diabetes ([place]) 10/03/2017  . Knee pain 10/02/2017  . Obesity 10/02/2017  . Charcot ankle, left 08/29/2017    Past Surgical History:  Procedure Laterality Date  . ABDOMINAL HYSTERECTOMY    . BREAST BIOPSY Left 2014   FNA 12:00 position - Negative  . EYE SURGERY       OB History    Gravida  2   Para      Term      Preterm      AB      Living  2     SAB      TAB      Ectopic      Multiple      Live Births               Home Medications    Prior to Admission medications   Medication Sig Start Date End Date Taking? Authorizing Provider  albuterol (PROVENTIL HFA;VENTOLIN HFA) 108 (90 Base) MCG/ACT inhaler Inhale 1-2 puffs into the lungs every 6 (six) hours as needed for wheezing or shortness of breath.    [provider]  amLODipine (NORVASC) 10 MG tablet Take 10 mg by mouth daily.    [provider]  aspirin EC 325 MG tablet Take 325 mg by mouth daily.    [provider]  Blood Glucose Monitoring Suppl (ONE TOUCH ULTRA 2) w/Device KIT 1 Device by Does not apply route daily. 03/26/18   Renato Shin, MD  cloNIDine (CATAPRES) 0.3 MG tablet Take 0.3 mg by mouth 2 (two) times daily.    [provider]  Continuous Blood Gluc Sensor (FREESTYLE LIBRE 14 DAY SENSOR) MISC 1 Device by Does not apply route every 14 (fourteen) days. 02/24/18   Renato Shin, MD  ferrous sulfate 325 (65 FE) MG tablet Take 325 mg by mouth 3 (three) times daily with meals.    [provider]  furosemide (LASIX) 20 MG tablet Take 20 mg by mouth daily.    [provider]  gabapentin (NEURONTIN) 600 MG tablet Take 600 mg by mouth at  bedtime.    [provider]  glucose blood (ONE TOUCH ULTRA TEST) test strip Used to check blood sugars four times daily. 03/26/18   Renato Shin, MD  insulin aspart (NOVOLOG FLEXPEN) 100 UNIT/ML FlexPen Inject 15 Units into the skin 3 (three) times daily with meals. 07/22/18   Renato Shin, MD  isosorbide mononitrate (IMDUR) 120 MG 24 hr tablet Take 120 mg by mouth daily.    [provider]  Lancets [facility] ULTRASOFT) lancets Used to check blood sugars four times daily. 03/26/18   Renato Shin, MD  meloxicam (MOBIC) 15 MG tablet Take 15 mg by mouth at bedtime.    [provider]  Multiple Vitamin (MULTIVITAMIN WITH MINERALS) TABS tablet Take 1 tablet by mouth daily.    [provider]  omeprazole (PRILOSEC) 40 MG capsule Take 40 mg by mouth 2 (two) times daily.    [provider]  ranitidine (ZANTAC) 300 MG tablet Take 300 mg by mouth at bedtime.    [provider]  spironolactone (ALDACTONE) 50 MG tablet Take 50 mg by mouth daily.    [provider]  Vitamin D, Ergocalciferol, (DRISDOL) 50000 units CAPS capsule Take 1 capsule (50,000 Units total) by mouth every 7 (seven) days. 07/23/18   Abby Potash, PA-C    Family History Family History  Problem Relation Age of Onset  . Breast cancer Sister 28  . Diabetes Sister   . Diabetes Mother   . Hypertension Mother   . Hyperlipidemia Mother   . Eating disorder Mother   . Obesity Mother     Social History Social History   Tobacco Use  . Smoking status: Never Smoker  . Smokeless tobacco: Never Used  Substance Use Topics  . Alcohol use: No  . Drug use: Never     Allergies   Statins; Codeine; Ibuprofen; Penicillins; Percocet [oxycodone-acetaminophen]; Tramadol; and Vicodin [hydrocodone-acetaminophen]   Review of Systems Review of Systems All other systems negative except as documented in the HPI. All pertinent positives and negatives as reviewed in the  HPI.  Physical Exam Updated Vital Signs BP (!) 158/62 (BP Location: Right Arm)   Pulse 77   Temp 98 F (36.7 C) (Oral)   Resp 16   SpO2 98%   Physical Exam  Constitutional: She is oriented to person, place, and time. She appears well-developed and well-nourished. No distress.  HENT:  Head: Normocephalic and atraumatic.  Eyes: Pupils are equal, round, and reactive to light.  Cardiovascular: Normal rate and regular rhythm. Exam  reveals no gallop and no friction rub.  No murmur heard. Pulmonary/Chest: Effort normal and breath sounds normal. No respiratory distress.  Musculoskeletal: She exhibits edema.       Legs: Neurological: She is alert and oriented to person, place, and time. She exhibits normal muscle tone. Coordination normal.  Skin: Skin is warm and dry.  Psychiatric: She has a normal mood and affect.  Nursing note and vitals reviewed.    ED Treatments / Results  Labs (all labs ordered are listed, but only abnormal results are displayed) Labs Reviewed - No data to display  EKG None  Radiology No results found.  Procedures Procedures (including critical care time)  Medications Ordered in ED Medications - No data to display   Initial Impression / Assessment and Plan / ED Course  I have reviewed the triage vital signs and the nursing notes.  Pertinent labs & imaging results that were available during my care of the patient were reviewed by me and considered in my medical decision making (see chart for details).     Patient will be evaluated with a DVT study.  The patient is otherwise stable and does not have any abnormalities noted on exam her vital signs.  Patient is advised of the plan and all questions were answered.  The patient does not have a DVT noted on her vascular study.  Patient will be given pain control for home and I will have her follow-up with her doctor along with elevating the extremity as much as possible.  Patient does not have any other  symptoms at this time therefore do not feel she needs any further work-up.  Patient is not short of breath and in no acute distress on examination.  Patient agrees the plan and all questions were answered.  Final Clinical Impressions(s) / ED Diagnoses   Final diagnoses:  None    ED Discharge Orders    None       Dalia Heading, PA-C 07/25/18 1808    Fredia Sorrow, MD 07/25/18 [phone]

## 2018-07-25 NOTE — Progress Notes (Signed)
Left lower extremity venous duplex has been completed. Negative for DVT. Results were given to Dr. Rogene Houston.  07/25/18 4:59 PM Carlos Levering RVT

## 2018-07-27 ENCOUNTER — Telehealth: Payer: Self-pay | Admitting: Endocrinology

## 2018-07-27 MED ORDER — FREESTYLE LIBRE 14 DAY SENSOR MISC: 1.0000 | 3 refills | Status: DC

## 2018-07-27 MED ORDER — FREESTYLE LIBRE 14 DAY READER DEVI: 1.0000 | Freq: Once | 0 refills | Status: DC

## 2018-07-27 NOTE — Telephone Encounter (Signed)
Ok, I have sent a prescription to your pharmacy 

## 2018-07-27 NOTE — Telephone Encounter (Signed)
Please advise on below  

## 2018-07-27 NOTE — Progress Notes (Signed)
Office: [phone]  /  Fax: [phone]   HPI:   Chief Complaint: OBESITY Rachel Holmes is here to discuss her progress with her obesity treatment plan. She was advised to keep a food journal of 1100 to 1200 calories and 80 grams of protein. She struggled with this plan and has been "using lean cuisine meals for lunch and dinner" approximately 50 % of the time. She states she is exercising 0 minutes 0 times per week. Brendan was advised to journal at her last visit, but she struggled with her app and internet service.  Her weight is 286 lb (129.7 kg) today and has lost 3 lbs in 4 weeks since her last visit. She has lost 15 lbs since starting treatment with [name].  Diabetes II Rachel Holmes has a diagnosis of diabetes type II. Rachel Holmes states that she does not check sugars at home. Her A1c was increased at 10.1 after stopping all of her medicines, but she is now back on meds.   Vitamin D deficiency Rachel Holmes has a diagnosis of vitamin D deficiency. Rachel Holmes's vitamin D level is not yet at goal on the vitamin D prescription. She still notes feeling fatigue.  ALLERGIES: Allergies  Allergen Reactions  . Statins Shortness Of Breath    Wheezing, short of breath  . Codeine Nausea Only  . Ibuprofen Other (See Comments)    Reaction:  Raises pts BP  . Penicillins Hives and Other (See Comments)    Has patient had a PCN reaction causing immediate rash, facial/tongue/throat swelling, SOB or lightheadedness with hypotension: No Has patient had a PCN reaction causing severe rash involving mucus membranes or skin necrosis: No Has patient had a PCN reaction that required hospitalization No Has patient had a PCN reaction occurring within the last 10 years: No If all of the above answers are "NO", then may proceed with Cephalosporin use.  Rachel Holmes Kitchen Percocet [Oxycodone-Acetaminophen] Nausea Only  . Tramadol Nausea Only  . Vicodin [Hydrocodone-Acetaminophen] Nausea Only    MEDICATIONS: Current Outpatient Medications on File Prior to  Visit  Medication Sig Dispense Refill  . albuterol (PROVENTIL HFA;VENTOLIN HFA) 108 (90 Base) MCG/ACT inhaler Inhale 1-2 puffs into the lungs every 6 (six) hours as needed for wheezing or shortness of breath.    Rachel Holmes Kitchen amLODipine (NORVASC) 10 MG tablet Take 10 mg by mouth daily.    Rachel Holmes Kitchen aspirin EC 325 MG tablet Take 325 mg by mouth daily.    . Blood Glucose Monitoring Suppl (ONE TOUCH ULTRA 2) w/Device KIT 1 Device by Does not apply route daily. 1 each 0  . cloNIDine (CATAPRES) 0.3 MG tablet Take 0.3 mg by mouth 2 (two) times daily.    . Continuous Blood Gluc Sensor (FREESTYLE LIBRE 14 DAY SENSOR) MISC 1 Device by Does not apply route every 14 (fourteen) days. 6 each 11  . ferrous sulfate 325 (65 FE) MG tablet Take 325 mg by mouth 3 (three) times daily with meals.    . furosemide (LASIX) 20 MG tablet Take 20 mg by mouth daily.    Rachel Holmes Kitchen gabapentin (NEURONTIN) 600 MG tablet Take 600 mg by mouth at bedtime.    Rachel Holmes Kitchen glucose blood (ONE TOUCH ULTRA TEST) test strip Used to check blood sugars four times daily. 150 each 12  . insulin aspart (NOVOLOG FLEXPEN) 100 UNIT/ML FlexPen Inject 15 Units into the skin 3 (three) times daily with meals. 15 mL 11  . isosorbide mononitrate (IMDUR) 120 MG 24 hr tablet Take 120 mg by mouth daily.    Rachel Holmes Kitchen  Lancets (ONETOUCH ULTRASOFT) lancets Used to check blood sugars four times daily. 200 each 12  . meloxicam (MOBIC) 15 MG tablet Take 15 mg by mouth at bedtime.    . Multiple Vitamin (MULTIVITAMIN WITH MINERALS) TABS tablet Take 1 tablet by mouth daily.    Rachel Holmes Kitchen omeprazole (PRILOSEC) 40 MG capsule Take 40 mg by mouth 2 (two) times daily.    . ranitidine (ZANTAC) 300 MG tablet Take 300 mg by mouth at bedtime.    Rachel Holmes Kitchen spironolactone (ALDACTONE) 50 MG tablet Take 50 mg by mouth daily.     No current facility-administered medications on file prior to visit.     PAST MEDICAL HISTORY: Past Medical History:  Diagnosis Date  . Allergic rhinitis   . Anemia   . Anxiety   . Back pain   .  Bradycardia   . Breast mass    Patient can no longer palpate specific masses but showed tech general area of concern  . CHF (congestive heart failure) ([place])   . CKD (chronic kidney disease)    STAGE 3  . Constipation   . Diabetes mellitus without complication ([place])   . Dyspnea   . GERD (gastroesophageal reflux disease)   . HLD (hyperlipidemia)   . Hyperparathyroidism ([place])   . Hypertension   . Joint pain   . Leg edema   . Legally blind in left eye, as defined in [place]   . Lymphedema   . Onychomycosis     PAST SURGICAL HISTORY: Past Surgical History:  Procedure Laterality Date  . ABDOMINAL HYSTERECTOMY    . BREAST BIOPSY Left 2014   FNA 12:00 position - Negative  . EYE SURGERY      SOCIAL HISTORY: Social History   Tobacco Use  . Smoking status: Never Smoker  . Smokeless tobacco: Never Used  Substance Use Topics  . Alcohol use: No  . Drug use: Never    FAMILY HISTORY: Family History  Problem Relation Age of Onset  . Breast cancer Sister 23  . Diabetes Sister   . Diabetes Mother   . Hypertension Mother   . Hyperlipidemia Mother   . Eating disorder Mother   . Obesity Mother     ROS: Review of Systems  Constitutional: Positive for malaise/fatigue and weight loss.    PHYSICAL EXAM: Blood pressure (!) 170/95, pulse 61, temperature 98 F (36.7 C), temperature source Oral, height 5' 6"  (1.676 m), weight 286 lb (129.7 kg), SpO2 98 %. Body mass index is 46.16 kg/m. Physical Exam  Constitutional: She is oriented to person, place, and time. She appears well-developed and well-nourished.  Cardiovascular: Normal rate.  Pulmonary/Chest: Effort normal.  Musculoskeletal: Normal range of motion.  Neurological: She is oriented to person, place, and time.  Skin: Skin is warm and dry.  Psychiatric: She has a normal mood and affect. Her behavior is normal.  Vitals reviewed.   RECENT LABS AND TESTS: BMET    Component Value Date/Time   NA 139 06/25/2018 1050   NA  137 11/21/2013 0627   K 5.6 (H) 06/25/2018 1050   K 4.6 11/21/2013 0627   CL 103 06/25/2018 1050   CL 107 11/21/2013 0627   CO2 21 06/25/2018 1050   CO2 24 11/21/2013 0627   GLUCOSE 248 (H) 06/25/2018 1050   GLUCOSE 86 02/03/2017 1119   GLUCOSE 95 11/21/2013 0627   BUN 43 (H) 06/25/2018 1050   BUN 34 (H) 11/21/2013 0627   CREATININE 2.59 (H) 06/25/2018 1050   CREATININE 1.80 (  H) 09/09/2014 1220   CALCIUM 9.0 06/25/2018 1050   CALCIUM 8.8 09/09/2014 1220   GFRNONAA 20 (L) 06/25/2018 1050   GFRNONAA 31 (L) 09/09/2014 1220   GFRNONAA 26 (L) 11/21/2013 0627   GFRAA 23 (L) 06/25/2018 1050   GFRAA 38 (L) 09/09/2014 1220   GFRAA 30 (L) 11/21/2013 0627   Lab Results  Component Value Date   HGBA1C 10.1 (A) 07/21/2018   HGBA1C 8.5 (A) 05/19/2018   HGBA1C 7.2 02/13/2018   HGBA1C 7.2 12/05/2017   HGBA1C 7.3 10/02/2017   No results found for: INSULIN CBC    Component Value Date/Time   WBC 6.1 06/25/2018 1050   WBC 7.8 02/03/2017 1119   RBC 3.47 (L) 06/25/2018 1050   RBC 4.26 02/03/2017 1119   HGB 9.7 (L) 06/25/2018 1050   HCT 30.3 (L) 06/25/2018 1050   PLT 230 02/03/2017 1119   PLT 210 09/09/2014 1220   MCV 87 06/25/2018 1050   MCV 87 09/09/2014 1220   MCH 28.0 06/25/2018 1050   MCH 28.2 02/03/2017 1119   MCHC 32.0 06/25/2018 1050   MCHC 32.4 02/03/2017 1119   RDW 14.0 06/25/2018 1050   RDW 15.1 (H) 09/09/2014 1220   LYMPHSABS 1.9 06/25/2018 1050   LYMPHSABS 2.0 09/09/2014 1220   MONOABS 0.4 02/03/2017 1119   MONOABS 0.5 09/09/2014 1220   EOSABS 0.2 06/25/2018 1050   EOSABS 0.4 09/09/2014 1220   BASOSABS 0.1 06/25/2018 1050   BASOSABS 0.1 09/09/2014 1220   Iron/TIBC/Ferritin/ %Sat    Component Value Date/Time   IRON 39 (L) 10/07/2014 1527   TIBC 202 (L) 10/07/2014 1527   FERRITIN 176 08/04/2014 1527   IRONPCTSAT 19 10/07/2014 1527   Lipid Panel     Component Value Date/Time   CHOL 191 06/25/2018 1050   CHOL 157 11/20/2013 0954   TRIG 104 06/25/2018 1050    TRIG 114 11/20/2013 0954   HDL 47 06/25/2018 1050   HDL 38 (L) 11/20/2013 0954   VLDL 23 11/20/2013 0954   LDLCALC 123 (H) 06/25/2018 1050   LDLCALC 96 11/20/2013 0954   Hepatic Function Panel     Component Value Date/Time   PROT 6.4 06/25/2018 1050   PROT 7.9 11/19/2013 0934   ALBUMIN 3.5 06/25/2018 1050   ALBUMIN 3.2 (L) 11/19/2013 0934   AST 13 06/25/2018 1050   AST 19 11/19/2013 0934   ALT 10 06/25/2018 1050   ALT 20 11/19/2013 0934   ALKPHOS 124 (H) 06/25/2018 1050   ALKPHOS 95 11/19/2013 0934   BILITOT 0.3 06/25/2018 1050   BILITOT 0.3 11/19/2013 0934      Component Value Date/Time   TSH 1.170 03/11/2018 1002   TSH 1.42 11/20/2013 0954   Results for MEGHNA, HAGMANN (MRN [id number]) as of 07/27/2018 15:37  Ref. Range 06/25/2018 10:50  Vitamin D, 25-Hydroxy Latest Ref Range: 30.0 - 100.0 ng/mL 28.5 (L)     ASSESSMENT AND PLAN: Type 2 diabetes mellitus without complication, with long-term current use of insulin (HCC)  Vitamin D deficiency - Plan: Vitamin D, Ergocalciferol, (DRISDOL) 50000 units CAPS capsule  Class 3 severe obesity with serious comorbidity and body mass index (BMI) of 45.0 to 49.9 in adult, unspecified obesity type (Rachel Holmes)  PLAN:  Diabetes II Rachel Holmes has been given extensive diabetes education by myself today including ideal fasting and post-prandial blood glucose readings, individual ideal Hgb A1c goals and hypoglycemia prevention. We discussed the importance of good blood sugar control to decrease the likelihood of diabetic complications such  as nephropathy, neuropathy, limb loss, blindness, coronary artery disease, and death. We discussed the importance of intensive lifestyle modification including diet, exercise and weight loss as the first line treatment for diabetes. Rachel Holmes agrees to check her blood sugars bid and check with her endocrinologist to restart continuous glucose monitoring. She agrees to continue her diabetes medications and diet and will  follow up at the agreed upon time in 4 weeks.  Vitamin D Deficiency Rachel Holmes was informed that low vitamin D levels contributes to fatigue and are associated with obesity, breast, and colon cancer. She agrees to continue to take prescription Vit D @50 ,000 IU every week #4 with no refills and will follow up for routine testing of vitamin D, at least 2-3 times per year. She was informed of the risk of over-replacement of vitamin D and agrees to not increase her dose unless she discusses this with [name] first. We will recheck labs in 2 months and Rachel Holmes agrees to follow up in 2 to 3 weeks.  Obesity Rachel Holmes is currently in the action stage of change. As such, her goal is to continue with weight loss efforts. She has agreed to keep a food journal with 1100 to 1200 calories and 75+ grams of protein. She was shown how to journal on paper and she has agreed to try this plan. Rachel Holmes has been instructed to work up to a goal of 150 minutes of combined cardio and strengthening exercise per week for weight loss and overall health benefits. We discussed the following Behavioral Modification Strategies today: increasing lean protein intake and decreasing simple carbohydrates.   Rachel Holmes has agreed to follow up with our clinic in 2 to 3 weeks. She was informed of the importance of frequent follow up visits to maximize her success with intensive lifestyle modifications for her multiple health conditions.   OBESITY BEHAVIORAL INTERVENTION VISIT  Today's visit was # 7  Starting weight: 301 lbs Starting date: 03/11/18 Today's weight : Weight: 286 lb (129.7 kg)  Today's date: 07/23/2018 Total lbs lost to date: 15 At least 15 minutes were spent on discussing the following behavioral intervention visit.   ASK: We discussed the diagnosis of obesity with Rachel Holmes today and Rachel Holmes agreed to give [name] permission to discuss obesity behavioral modification therapy today.  ASSESS: Rachel Holmes has the diagnosis of obesity and her  BMI today is 46.18. Rachel Holmes is in the action stage of change.   ADVISE: Rachel Holmes was educated on the multiple health risks of obesity as well as the benefit of weight loss to improve her health. She was advised of the need for long term treatment and the importance of lifestyle modifications to improve her current health and to decrease her risk of future health problems.  AGREE: Multiple dietary modification options and treatment options were discussed and Rachel Holmes agreed to follow the recommendations documented in the above note.  ARRANGE: Rachel Holmes was educated on the importance of frequent visits to treat obesity as outlined per CMS and USPSTF guidelines and agreed to schedule her next follow up appointment today.  I, Marcille Blanco, am acting as transcriptionist for Starlyn Skeans, MD  I have reviewed the above documentation for accuracy and completeness, and I agree with the above. -Dennard Nip, MD

## 2018-07-27 NOTE — Telephone Encounter (Signed)
Ok, do you want to change to a qd insulin?

## 2018-07-27 NOTE — Telephone Encounter (Signed)
Please advise on this part of the message :  Pt states when she takes insulin aspart (NOVOLOG FLEXPEN) 100 UNIT/ML FlexPen it makes her body cramp legs,hands and both side of neck. It was raised from 5 to 15 units.

## 2018-07-27 NOTE — Telephone Encounter (Signed)
Pt states when she takes insulin aspart (NOVOLOG FLEXPEN) 100 UNIT/ML FlexPen it makes her body cramp legs,hands and both side of neck. It was raised from 5 to 15 units. Pt would like the devise that goes in arm to check sugar? Instead os sticking her fingers which have been getting numb.    Pharmacy is [facility] [address], [place] - [address]  Call pt @ [phone]. Thank you!

## 2018-07-28 NOTE — Telephone Encounter (Signed)
OK, how about if you resume the trulicity, and take humalog 10 units 3 times a day (just before each meal)?  OK with you?

## 2018-07-28 NOTE — Telephone Encounter (Signed)
Pt stated that she wanted to go to a once a week injection like the trulicity she was on and an equalivant to [employer]

## 2018-07-30 NOTE — Telephone Encounter (Signed)
Pt stated that this is fine but wanted to know whether the rx for a Elenor Legato had been sent

## 2018-07-30 NOTE — Telephone Encounter (Signed)
Please disregard last message. 

## 2018-08-03 ENCOUNTER — Encounter: Payer: Self-pay | Admitting: Podiatry

## 2018-08-03 ENCOUNTER — Telehealth: Payer: Self-pay | Admitting: Podiatry

## 2018-08-03 ENCOUNTER — Ambulatory Visit: Payer: Medicare HMO | Admitting: Podiatry

## 2018-08-03 DIAGNOSIS — M7752 Other enthesopathy of left foot: Secondary | ICD-10-CM

## 2018-08-03 DIAGNOSIS — M14672 Charcot's joint, left ankle and foot: Secondary | ICD-10-CM

## 2018-08-03 DIAGNOSIS — M779 Enthesopathy, unspecified: Secondary | ICD-10-CM

## 2018-08-03 MED ORDER — TRIAMCINOLONE ACETONIDE 10 MG/ML IJ SUSP
10.0000 mg | Freq: Once | INTRAMUSCULAR | Status: AC
  Administered 2018-08-03: 10 mg

## 2018-08-03 NOTE — Telephone Encounter (Signed)
I asked pt if this was a call before she was seen and she said yes she saw Dr. Paulla Dolly at 4:00pm and he gave her a shot and a sock and she feels much better.

## 2018-08-03 NOTE — Progress Notes (Signed)
Subjective:   Patient ID: Rachel Holmes, female   DOB: 57 y.o.   MRN: [id number]   HPI Patient states she started developed a lot of pain in the outside of the left ankle again was doing pretty well prior and states her foot is also been swelling with the ankle swelling   ROS      Objective:  Physical Exam  Neurovascular status intact with significant discomfort occurring in the sinus tarsi left into the lateral ankle gutter with patient any severe arthritis of the joint and probable Charcot foot along with edema with negative Homans sign noted     Assessment:  Sinus tarsitis left with with tendinitis of the lateral ankle gutter and edema in the ankle     Plan:  Injected the sinus tarsi into the lateral ankle gutter 4 mg Xylocaine Marcaine Kenalog dexamethasone mix and dispensed a ankle compression stocking to help with swelling.  Reappoint to recheck and she is waiting to get her brace and understand eventually she is going to need extensive reconstructive surgery

## 2018-08-03 NOTE — Telephone Encounter (Signed)
I was calling to see if Dr. Paulla Dolly could give me something for my foot/legs. My foot is worse and I don't know if I have an infection in it or what. Please call me back at [phone]. Thank you.

## 2018-08-05 ENCOUNTER — Telehealth: Payer: Self-pay

## 2018-08-05 NOTE — Telephone Encounter (Signed)
Patient called today requesting a new prescription for trulicity be sent to [facility] on [address] because she was told in last visit to take it but was not given a prescription- patient also stated that her pharmacy sent over a PA request for freestyle libre and have not heard form our office

## 2018-08-06 ENCOUNTER — Other Ambulatory Visit: Payer: Self-pay

## 2018-08-06 MED ORDER — DULAGLUTIDE 0.75 MG/0.5ML ~~LOC~~ SOAJ: 1.0000 "pen " | SUBCUTANEOUS | 1 refills | Status: DC

## 2018-08-06 NOTE — Telephone Encounter (Signed)
Please advise on this prior authorization below

## 2018-08-10 ENCOUNTER — Ambulatory Visit (INDEPENDENT_AMBULATORY_CARE_PROVIDER_SITE_OTHER): Payer: Medicare HMO | Admitting: Family Medicine

## 2018-08-10 VITALS — BP 125/73 | HR 70 | Temp 98.3°F | Ht 66.0 in | Wt 292.0 lb

## 2018-08-10 DIAGNOSIS — E559 Vitamin D deficiency, unspecified: Secondary | ICD-10-CM

## 2018-08-10 DIAGNOSIS — Z6841 Body Mass Index (BMI) 40.0 and over, adult: Secondary | ICD-10-CM | POA: Diagnosis not present

## 2018-08-10 DIAGNOSIS — R809 Proteinuria, unspecified: Secondary | ICD-10-CM

## 2018-08-10 DIAGNOSIS — E1129 Type 2 diabetes mellitus with other diabetic kidney complication: Secondary | ICD-10-CM | POA: Diagnosis not present

## 2018-08-10 MED ORDER — VITAMIN D (ERGOCALCIFEROL) 1.25 MG (50000 UNIT) PO CAPS: 50000.0000 [IU] | ORAL_CAPSULE | ORAL | 0 refills | Status: DC

## 2018-08-11 NOTE — Progress Notes (Signed)
.dmii Office: [phone]  /  Fax: [phone]   HPI:   Chief Complaint: OBESITY Rachel Holmes is here to discuss her progress with her obesity treatment plan. She is on the keep a food journal with 1100-1200 calories and 75+ grams of protein daily and is following her eating plan approximately 0 % of the time. She states she is exercising 0 minutes 0 times per week. Rachel Holmes has not been journaling. She is eating sweets and keeping sweets in the house.  Her weight is 292 lb (132.5 kg) today and has gained 6 pounds since her last visit. She has lost 9 lbs since starting treatment with [name].  Diabetes II Rachel Holmes has a diagnosis of diabetes type II. Vonetta states fasting BGs range between 69 and 190, and 2 hour post prandial range between 123 and 210. She is on Trulicity and insulin, and her blood sugars have improved since starting Trulicity. She sees her Endocrinologist on 08/21/18. She denies any hypoglycemic episodes. Last A1c was 10.1 on 07/21/18. She has been working on intensive lifestyle modifications including diet, exercise, and weight loss to help control her blood glucose levels.  Vitamin D Deficiency Rachel Holmes has a diagnosis of vitamin D deficiency. She is currently taking prescription Vit D and denies nausea, vomiting or muscle weakness.  ALLERGIES: Allergies  Allergen Reactions  . Statins Shortness Of Breath    Wheezing, short of breath  . Codeine Nausea Only  . Ibuprofen Other (See Comments)    Reaction:  Raises pts BP  . Penicillins Hives and Other (See Comments)    Has patient had a PCN reaction causing immediate rash, facial/tongue/throat swelling, SOB or lightheadedness with hypotension: No Has patient had a PCN reaction causing severe rash involving mucus membranes or skin necrosis: No Has patient had a PCN reaction that required hospitalization No Has patient had a PCN reaction occurring within the last 10 years: No If all of the above answers are "NO", then may proceed with  Cephalosporin use.  [name] Percocet [Oxycodone-Acetaminophen] Nausea Only  . Tramadol Nausea Only  . Vicodin [Hydrocodone-Acetaminophen] Nausea Only    MEDICATIONS: Current Outpatient Medications on File Prior to Visit  Medication Sig Dispense Refill  . albuterol (PROVENTIL HFA;VENTOLIN HFA) 108 (90 Base) MCG/ACT inhaler Inhale 1-2 puffs into the lungs every 6 (six) hours as needed for wheezing or shortness of breath.    [name] amLODipine (NORVASC) 10 MG tablet Take 10 mg by mouth daily.    [name] aspirin EC 325 MG tablet Take 325 mg by mouth daily.    . Blood Glucose Monitoring Suppl (ONE TOUCH ULTRA 2) w/Device KIT 1 Device by Does not apply route daily. 1 each 0  . cloNIDine (CATAPRES) 0.3 MG tablet Take 0.3 mg by mouth 2 (two) times daily.    . Continuous Blood Gluc Sensor (FREESTYLE LIBRE 14 DAY SENSOR) MISC 1 Device by Does not apply route every 14 (fourteen) days. 6 each 3  . Dulaglutide (TRULICITY) 5.36 UY/4.0HK SOPN Inject 1 pen into the skin once a week. 4 pen 1  . ferrous sulfate 325 (65 FE) MG tablet Take 325 mg by mouth 3 (three) times daily with meals.    . furosemide (LASIX) 20 MG tablet Take 20 mg by mouth daily.    [name] gabapentin (NEURONTIN) 600 MG tablet Take 600 mg by mouth at bedtime.    [name] glucose blood (ONE TOUCH ULTRA TEST) test strip Used to check blood sugars four times daily. 150 each 12  . insulin aspart (NOVOLOG  FLEXPEN) 100 UNIT/ML FlexPen Inject 15 Units into the skin 3 (three) times daily with meals. 15 mL 11  . isosorbide mononitrate (IMDUR) 120 MG 24 hr tablet Take 120 mg by mouth daily.    . Lancets (ONETOUCH ULTRASOFT) lancets Used to check blood sugars four times daily. 200 each 12  . meloxicam (MOBIC) 15 MG tablet Take 15 mg by mouth at bedtime.    . Multiple Vitamin (MULTIVITAMIN WITH MINERALS) TABS tablet Take 1 tablet by mouth daily.    [name] omeprazole (PRILOSEC) 40 MG capsule Take 40 mg by mouth 2 (two) times daily.    . ranitidine (ZANTAC) 300 MG tablet Take 300 mg by  mouth at bedtime.    [name] spironolactone (ALDACTONE) 50 MG tablet Take 50 mg by mouth daily.     No current facility-administered medications on file prior to visit.     PAST MEDICAL HISTORY: Past Medical History:  Diagnosis Date  . Allergic rhinitis   . Anemia   . Anxiety   . Back pain   . Bradycardia   . Breast mass    Patient can no longer palpate specific masses but showed tech general area of concern  . CHF (congestive heart failure) ([place])   . CKD (chronic kidney disease)    STAGE 3  . Constipation   . Diabetes mellitus without complication ([place])   . Dyspnea   . GERD (gastroesophageal reflux disease)   . HLD (hyperlipidemia)   . Hyperparathyroidism ([place])   . Hypertension   . Joint pain   . Leg edema   . Legally blind in left eye, as defined in [place]   . Lymphedema   . Onychomycosis     PAST SURGICAL HISTORY: Past Surgical History:  Procedure Laterality Date  . ABDOMINAL HYSTERECTOMY    . BREAST BIOPSY Left 2014   FNA 12:00 position - Negative  . EYE SURGERY      SOCIAL HISTORY: Social History   Tobacco Use  . Smoking status: Never Smoker  . Smokeless tobacco: Never Used  Substance Use Topics  . Alcohol use: No  . Drug use: Never    FAMILY HISTORY: Family History  Problem Relation Age of Onset  . Breast cancer Sister 29  . Diabetes Sister   . Diabetes Mother   . Hypertension Mother   . Hyperlipidemia Mother   . Eating disorder Mother   . Obesity Mother     ROS: Review of Systems  Constitutional: Negative for weight loss.  Gastrointestinal: Negative for nausea and vomiting.  Musculoskeletal:       Negative muscle weakness  Endo/Heme/Allergies:       Negative hypoglycemia    PHYSICAL EXAM: Blood pressure 125/73, pulse 70, temperature 98.3 F (36.8 C), temperature source Oral, height 5' 6"  (1.676 m), weight 292 lb (132.5 kg), SpO2 99 %. Body mass index is 47.13 kg/m. Physical Exam  Constitutional: She is oriented to person, place, and time.  She appears well-developed and well-nourished.  Cardiovascular: Normal rate.  Pulmonary/Chest: Effort normal.  Musculoskeletal: Normal range of motion.  Neurological: She is oriented to person, place, and time.  Skin: Skin is warm and dry.  Psychiatric: She has a normal mood and affect. Her behavior is normal.  Vitals reviewed.   RECENT LABS AND TESTS: BMET    Component Value Date/Time   NA 139 06/25/2018 1050   NA 137 11/21/2013 0627   K 5.6 (H) 06/25/2018 1050   K 4.6 11/21/2013 0627   CL 103  06/25/2018 1050   CL 107 11/21/2013 0627   CO2 21 06/25/2018 1050   CO2 24 11/21/2013 0627   GLUCOSE 248 (H) 06/25/2018 1050   GLUCOSE 86 02/03/2017 1119   GLUCOSE 95 11/21/2013 0627   BUN 43 (H) 06/25/2018 1050   BUN 34 (H) 11/21/2013 0627   CREATININE 2.59 (H) 06/25/2018 1050   CREATININE 1.80 (H) 09/09/2014 1220   CALCIUM 9.0 06/25/2018 1050   CALCIUM 8.8 09/09/2014 1220   GFRNONAA 20 (L) 06/25/2018 1050   GFRNONAA 31 (L) 09/09/2014 1220   GFRNONAA 26 (L) 11/21/2013 0627   GFRAA 23 (L) 06/25/2018 1050   GFRAA 38 (L) 09/09/2014 1220   GFRAA 30 (L) 11/21/2013 0627   Lab Results  Component Value Date   HGBA1C 10.1 (A) 07/21/2018   HGBA1C 8.5 (A) 05/19/2018   HGBA1C 7.2 02/13/2018   HGBA1C 7.2 12/05/2017   HGBA1C 7.3 10/02/2017   No results found for: INSULIN CBC    Component Value Date/Time   WBC 6.1 06/25/2018 1050   WBC 7.8 02/03/2017 1119   RBC 3.47 (L) 06/25/2018 1050   RBC 4.26 02/03/2017 1119   HGB 9.7 (L) 06/25/2018 1050   HCT 30.3 (L) 06/25/2018 1050   PLT 230 02/03/2017 1119   PLT 210 09/09/2014 1220   MCV 87 06/25/2018 1050   MCV 87 09/09/2014 1220   MCH 28.0 06/25/2018 1050   MCH 28.2 02/03/2017 1119   MCHC 32.0 06/25/2018 1050   MCHC 32.4 02/03/2017 1119   RDW 14.0 06/25/2018 1050   RDW 15.1 (H) 09/09/2014 1220   LYMPHSABS 1.9 06/25/2018 1050   LYMPHSABS 2.0 09/09/2014 1220   MONOABS 0.4 02/03/2017 1119   MONOABS 0.5 09/09/2014 1220   EOSABS 0.2  06/25/2018 1050   EOSABS 0.4 09/09/2014 1220   BASOSABS 0.1 06/25/2018 1050   BASOSABS 0.1 09/09/2014 1220   Iron/TIBC/Ferritin/ %Sat    Component Value Date/Time   IRON 39 (L) 10/07/2014 1527   TIBC 202 (L) 10/07/2014 1527   FERRITIN 176 08/04/2014 1527   IRONPCTSAT 19 10/07/2014 1527   Lipid Panel     Component Value Date/Time   CHOL 191 06/25/2018 1050   CHOL 157 11/20/2013 0954   TRIG 104 06/25/2018 1050   TRIG 114 11/20/2013 0954   HDL 47 06/25/2018 1050   HDL 38 (L) 11/20/2013 0954   VLDL 23 11/20/2013 0954   LDLCALC 123 (H) 06/25/2018 1050   LDLCALC 96 11/20/2013 0954   Hepatic Function Panel     Component Value Date/Time   PROT 6.4 06/25/2018 1050   PROT 7.9 11/19/2013 0934   ALBUMIN 3.5 06/25/2018 1050   ALBUMIN 3.2 (L) 11/19/2013 0934   AST 13 06/25/2018 1050   AST 19 11/19/2013 0934   ALT 10 06/25/2018 1050   ALT 20 11/19/2013 0934   ALKPHOS 124 (H) 06/25/2018 1050   ALKPHOS 95 11/19/2013 0934   BILITOT 0.3 06/25/2018 1050   BILITOT 0.3 11/19/2013 0934      Component Value Date/Time   TSH 1.170 03/11/2018 1002   TSH 1.42 11/20/2013 0954  Results for MEGGAN, DHALIWAL (MRN [id number]) as of 08/11/2018 17:36  Ref. Range 06/25/2018 10:50  Vitamin D, 25-Hydroxy Latest Ref Range: 30.0 - 100.0 ng/mL 28.5 (L)    ASSESSMENT AND PLAN: Type 2 diabetes mellitus with microalbuminuria, without long-term current use of insulin (HCC)  Vitamin D deficiency - Plan: Vitamin D, Ergocalciferol, (DRISDOL) 50000 units CAPS capsule  Class 3 severe obesity with serious comorbidity and  body mass index (BMI) of 45.0 to 49.9 in adult, unspecified obesity type ([place])  PLAN:  Diabetes II Antanasia has been given extensive diabetes education by myself today including ideal fasting and post-prandial blood glucose readings, individual ideal Hgb A1c goals and hypoglycemia prevention. We discussed the importance of good blood sugar control to decrease the likelihood of diabetic  complications such as nephropathy, neuropathy, limb loss, blindness, coronary artery disease, and death. We discussed the importance of intensive lifestyle modification including diet, exercise and weight loss as the first line treatment for diabetes. Annalina agrees to continue Trulicity and insulin, and continue diet and she is to follow up with her Endocrinologist. She was encourage to eat 1 protein bedtime snack to prevent hypoglycemia. Yittel agrees to follo up with our clinic in 3 weeks.  Vitamin D Deficiency Neisha was informed that low vitamin D levels contributes to fatigue and are associated with obesity, breast, and colon cancer. Camary agrees to continue taking prescription Vit D @50 ,000 IU every week #4 and we will refill for 1 month. She will follow up for routine testing of vitamin D, at least 2-3 times per year. She was informed of the risk of over-replacement of vitamin D and agrees to not increase her dose unless she discusses this with [name] first. Zehava agrees to follow up with our clinic in 3 weeks.  Obesity Chlora is currently in the action stage of change. As such, her goal is to continue with weight loss efforts She has agreed to keep a food journal with 1200 calories and 80 grams of protein daily Ellicia has been instructed to work up to a goal of 150 minutes of combined cardio and strengthening exercise per week for weight loss and overall health benefits. We discussed the following Behavioral Modification Strategies today: increasing lean protein intake, decreasing simple carbohydrates, increase H20 intake, no skipping meals, keeping healthy foods in the home, better snacking choices, avoiding temptations, planning for success, and keep a strict food journal I helped her put the MyFitnessPal app on her phone and demonstrated how to use it. We discussed eliminating junk food from the house.  Aalijah has agreed to follow up with our clinic in 3 weeks. She was informed of the importance of  frequent follow up visits to maximize her success with intensive lifestyle modifications for her multiple health conditions.   OBESITY BEHAVIORAL INTERVENTION VISIT  Today's visit was # 8   Starting weight: 301 lbs Starting date: 03/11/18 Today's weight : 292 lbs  Today's date: 08/10/2018 Total lbs lost to date: 9 At least 15 minutes were spent on discussing the following behavioral intervention visit.   ASK: We discussed the diagnosis of obesity with Rondel Jumbo today and Aamina agreed to give [name] permission to discuss obesity behavioral modification therapy today.  ASSESS: Floetta has the diagnosis of obesity and her BMI today is 47.15 Adella is in the action stage of change   ADVISE: Aubrii was educated on the multiple health risks of obesity as well as the benefit of weight loss to improve her health. She was advised of the need for long term treatment and the importance of lifestyle modifications to improve her current health and to decrease her risk of future health problems.  AGREE: Multiple dietary modification options and treatment options were discussed and  Adie agreed to follow the recommendations documented in the above note.  ARRANGE: Asusena was educated on the importance of frequent visits to treat obesity as outlined per CMS and  USPSTF guidelines and agreed to schedule her next follow up appointment today.  I, Trixie Dredge, am acting as transcriptionist for Dennard Nip, MD  I have reviewed the above documentation for accuracy and completeness, and I agree with the above. -Dennard Nip, MD

## 2018-08-18 ENCOUNTER — Encounter (INDEPENDENT_AMBULATORY_CARE_PROVIDER_SITE_OTHER): Payer: Self-pay

## 2018-08-21 ENCOUNTER — Encounter: Payer: Self-pay | Admitting: Endocrinology

## 2018-08-21 ENCOUNTER — Ambulatory Visit: Payer: Medicare HMO | Admitting: Endocrinology

## 2018-08-21 ENCOUNTER — Ambulatory Visit (INDEPENDENT_AMBULATORY_CARE_PROVIDER_SITE_OTHER): Payer: Medicare HMO | Admitting: Endocrinology

## 2018-08-21 VITALS — BP 132/70 | HR 81 | Ht 66.0 in | Wt 299.8 lb

## 2018-08-21 DIAGNOSIS — Z23 Encounter for immunization: Secondary | ICD-10-CM

## 2018-08-21 DIAGNOSIS — E1129 Type 2 diabetes mellitus with other diabetic kidney complication: Secondary | ICD-10-CM | POA: Diagnosis not present

## 2018-08-21 DIAGNOSIS — R809 Proteinuria, unspecified: Secondary | ICD-10-CM | POA: Diagnosis not present

## 2018-08-21 MED ORDER — INSULIN ASPART 100 UNIT/ML FLEXPEN: 5.0000 [IU] | PEN_INJECTOR | Freq: Three times a day (TID) | SUBCUTANEOUS | 11 refills | Status: DC

## 2018-08-21 MED ORDER — DULAGLUTIDE 1.5 MG/0.5ML ~~LOC~~ SOAJ: 1.5000 mg | SUBCUTANEOUS | 11 refills | Status: DC

## 2018-08-21 NOTE — Patient Instructions (Addendum)
check your blood sugar twice a day.  vary the time of day when you check, between before the 3 meals, and at bedtime.  also check if you have symptoms of your blood sugar being too high or too low.  please keep a record of the readings and bring it to your next appointment here (or you can bring the meter itself).  You can write it on any piece of paper.  please call us sooner if your blood sugar goes below 70, or if you have a lot of readings over 200. I have sent a prescription to your pharmacy, to double the trulicity, and: decrease the humalog to 5 units 3 times a day (just before each meal).   Please stop taking the meloxican, as it is not good for your kidneys.   Please come back for a follow-up appointment in 2-3 months.

## 2018-08-21 NOTE — Progress Notes (Signed)
Subjective:    Patient ID: Rachel Holmes, female    DOB: 1960/12/12, 57 y.o.   MRN: [id number]  HPI Pt returns for f/u of diabetes mellitus: DM type: Insulin-requiring type 2 Dx'ed: 1988, during a pregnancy, but it persisted after Complications: polyneuropathy, renal failure, leg ulcer, and PDR.   Therapy: insulin since soon after dx, and trulicity GDM: never DKA: never Severe hypoglycemia: 2 episodes (both many years ago).   Pancreatitis: never Pancreatic imaging: normal on 2008 [name].   Other: she takes multiple daily injections. Interval history: no cbg record, but states cbg's vary from 59-180.  It is lowest after breakfast.  She takes humalog, 10 units 3 times a day (just before each meal), and trulicity.   Past Medical History:  Diagnosis Date  . Allergic rhinitis   . Anemia   . Anxiety   . Back pain   . Bradycardia   . Breast mass    Patient can no longer palpate specific masses but showed tech general area of concern  . CHF (congestive heart failure) ([place])   . CKD (chronic kidney disease)    STAGE 3  . Constipation   . Diabetes mellitus without complication ([place])   . Dyspnea   . GERD (gastroesophageal reflux disease)   . HLD (hyperlipidemia)   . Hyperparathyroidism ([place])   . Hypertension   . Joint pain   . Leg edema   . Legally blind in left eye, as defined in [place]   . Lymphedema   . Onychomycosis     Past Surgical History:  Procedure Laterality Date  . ABDOMINAL HYSTERECTOMY    . BREAST BIOPSY Left 2014   FNA 12:00 position - Negative  . EYE SURGERY      Social History   Socioeconomic History  . Marital status: Divorced    Spouse name: Not on file  . Number of children: Not on file  . Years of education: Not on file  . Highest education level: Not on file  Occupational History  . Occupation: Glass blower/designer  Social Needs  . Financial resource strain: Not on file  . Food insecurity:    Worry: Not on file    Inability: Not on file  .  Transportation needs:    Medical: Not on file    Non-medical: Not on file  Tobacco Use  . Smoking status: Never Smoker  . Smokeless tobacco: Never Used  Substance and Sexual Activity  . Alcohol use: No  . Drug use: Never  . Sexual activity: Not on file  Lifestyle  . Physical activity:    Days per week: Not on file    Minutes per session: Not on file  . Stress: Not on file  Relationships  . Social connections:    Talks on phone: Not on file    Gets together: Not on file    Attends religious service: Not on file    Active member of club or organization: Not on file    Attends meetings of clubs or organizations: Not on file    Relationship status: Not on file  . Intimate partner violence:    Fear of current or ex partner: Not on file    Emotionally abused: Not on file    Physically abused: Not on file    Forced sexual activity: Not on file  Other Topics Concern  . Not on file  Social History Narrative   ** Merged History Encounter **  Current Outpatient Medications on File Prior to Visit  Medication Sig Dispense Refill  . albuterol (PROVENTIL HFA;VENTOLIN HFA) 108 (90 Base) MCG/ACT inhaler Inhale 1-2 puffs into the lungs every 6 (six) hours as needed for wheezing or shortness of breath.    [name] amLODipine (NORVASC) 10 MG tablet Take 10 mg by mouth daily.    [name] aspirin EC 325 MG tablet Take 325 mg by mouth daily.    . Blood Glucose Monitoring Suppl (ONE TOUCH ULTRA 2) w/Device KIT 1 Device by Does not apply route daily. 1 each 0  . cloNIDine (CATAPRES) 0.3 MG tablet Take 0.3 mg by mouth 2 (two) times daily.    . Continuous Blood Gluc Sensor (FREESTYLE LIBRE 14 DAY SENSOR) MISC 1 Device by Does not apply route every 14 (fourteen) days. 6 each 3  . ferrous sulfate 325 (65 FE) MG tablet Take 325 mg by mouth 3 (three) times daily with meals.    . furosemide (LASIX) 20 MG tablet Take 20 mg by mouth daily.    [name] gabapentin (NEURONTIN) 600 MG tablet Take 600 mg by mouth at bedtime.     [name] glucose blood (ONE TOUCH ULTRA TEST) test strip Used to check blood sugars four times daily. 150 each 12  . isosorbide mononitrate (IMDUR) 120 MG 24 hr tablet Take 120 mg by mouth daily.    . Lancets (ONETOUCH ULTRASOFT) lancets Used to check blood sugars four times daily. 200 each 12  . Multiple Vitamin (MULTIVITAMIN WITH MINERALS) TABS tablet Take 1 tablet by mouth daily.    [name] omeprazole (PRILOSEC) 40 MG capsule Take 40 mg by mouth 2 (two) times daily.    . ranitidine (ZANTAC) 300 MG tablet Take 300 mg by mouth at bedtime.    [name] spironolactone (ALDACTONE) 50 MG tablet Take 50 mg by mouth daily.    . Vitamin D, Ergocalciferol, (DRISDOL) 50000 units CAPS capsule Take 1 capsule (50,000 Units total) by mouth every 7 (seven) days. 4 capsule 0   No current facility-administered medications on file prior to visit.     Allergies  Allergen Reactions  . Statins Shortness Of Breath    Wheezing, short of breath  . Codeine Nausea Only  . Ibuprofen Other (See Comments)    Reaction:  Raises pts BP  . Penicillins Hives and Other (See Comments)    Has patient had a PCN reaction causing immediate rash, facial/tongue/throat swelling, SOB or lightheadedness with hypotension: No Has patient had a PCN reaction causing severe rash involving mucus membranes or skin necrosis: No Has patient had a PCN reaction that required hospitalization No Has patient had a PCN reaction occurring within the last 10 years: No If all of the above answers are "NO", then may proceed with Cephalosporin use.  [name] Percocet [Oxycodone-Acetaminophen] Nausea Only  . Tramadol Nausea Only  . Vicodin [Hydrocodone-Acetaminophen] Nausea Only    Family History  Problem Relation Age of Onset  . Breast cancer Sister 50  . Diabetes Sister   . Diabetes Mother   . Hypertension Mother   . Hyperlipidemia Mother   . Eating disorder Mother   . Obesity Mother     BP 132/70 (BP Location: Left Arm, Patient Position: Sitting, Cuff Size:  Large)   Pulse 81   Ht 5' 6"  (1.676 m)   Wt 299 lb 12.8 oz (136 kg) Comment: with walking boot. Pt requested not to remove  SpO2 95%   BMI 48.39 kg/m    Review of Systems Denies LOC.  Objective:   Physical Exam VITAL SIGNS:  See vs page GENERAL: no distress Pulses: right foot pulse is intact.   MSK: no deformity of the right foot CV: 1+ on the right leg Skin:  no ulcer on the foot and ankle.  normal color and temp on the right foot and ankle.   Neuro: sensation is intact to touch on the right foot and ankle, but decreased from normal.  Ext: Left foot is in a boot.    Lab Results  Component Value Date   CREATININE 2.59 (H) 06/25/2018   BUN 43 (H) 06/25/2018   NA 139 06/25/2018   K 5.6 (H) 06/25/2018   CL 103 06/25/2018   CO2 21 06/25/2018      Assessment & Plan:  Insulin-requiring type 2 DM, with PDR: glycemic control is limited by variable cbg's Hypoglycemia: she needs to decrease insulin.  Renal failure: she should avoid NSAID.   Patient Instructions  check your blood sugar twice a day.  vary the time of day when you check, between before the 3 meals, and at bedtime.  also check if you have symptoms of your blood sugar being too high or too low.  please keep a record of the readings and bring it to your next appointment here (or you can bring the meter itself).  You can write it on any piece of paper.  please call us sooner if your blood sugar goes below 70, or if you have a lot of readings over 200. I have sent a prescription to your pharmacy, to double the trulicity, and: decrease the humalog to 5 units 3 times a day (just before each meal).   Please stop taking the meloxican, as it is not good for your kidneys.   Please come back for a follow-up appointment in 2-3 months.

## 2018-08-26 NOTE — Progress Notes (Signed)
[facility] [place], [name] [address] Phone: [phone] Subjective:   Fontaine No, am serving as a scribe for Dr. Hulan Saas.  I'm seeing this patient by the request  of:    CC: Right knee follow-up  AXK:[id number]  Rachel Holmes is a 57 y.o. female coming in with complaint of right knee pain. Patient had monovisc injection last visit. Increasing pain for past 2 weeks.  Patient does have severe arthritic changes.  Visco supplementation given 2 months ago.  Pain is severe again.  Increasing instability.  Considering the possibility of replacement in the near future.    Had to discontinue anti-inflammatory secondary to kidney disease and peripheral edema  Past Medical History:  Diagnosis Date  . Allergic rhinitis   . Anemia   . Anxiety   . Back pain   . Bradycardia   . Breast mass    Patient can no longer palpate specific masses but showed tech general area of concern  . CHF (congestive heart failure) ([place])   . CKD (chronic kidney disease)    STAGE 3  . Constipation   . Diabetes mellitus without complication ([name])   . Dyspnea   . GERD (gastroesophageal reflux disease)   . HLD (hyperlipidemia)   . Hyperparathyroidism ([place])   . Hypertension   . Joint pain   . Leg edema   . Legally blind in left eye, as defined in [place]   . Lymphedema   . Onychomycosis    Past Surgical History:  Procedure Laterality Date  . ABDOMINAL HYSTERECTOMY    . BREAST BIOPSY Left 2014   FNA 12:00 position - Negative  . EYE SURGERY     Social History   Socioeconomic History  . Marital status: Divorced    Spouse name: Not on file  . Number of children: Not on file  . Years of education: Not on file  . Highest education level: Not on file  Occupational History  . Occupation: Glass blower/designer  Social Needs  . Financial resource strain: Not on file  . Food insecurity:    Worry: Not on file    Inability: Not on file  . Transportation  needs:    Medical: Not on file    Non-medical: Not on file  Tobacco Use  . Smoking status: Never Smoker  . Smokeless tobacco: Never Used  Substance and Sexual Activity  . Alcohol use: No  . Drug use: Never  . Sexual activity: Not on file  Lifestyle  . Physical activity:    Days per week: Not on file    Minutes per session: Not on file  . Stress: Not on file  Relationships  . Social connections:    Talks on phone: Not on file    Gets together: Not on file    Attends religious service: Not on file    Active member of club or organization: Not on file    Attends meetings of clubs or organizations: Not on file    Relationship status: Not on file  Other Topics Concern  . Not on file  Social History Narrative   ** Merged History Encounter **       Allergies  Allergen Reactions  . Statins Shortness Of Breath    Wheezing, short of breath  . Codeine Nausea Only  . Ibuprofen Other (See Comments)    Reaction:  Raises pts BP  . Penicillins Hives and Other (See Comments)  Has patient had a PCN reaction causing immediate rash, facial/tongue/throat swelling, SOB or lightheadedness with hypotension: No Has patient had a PCN reaction causing severe rash involving mucus membranes or skin necrosis: No Has patient had a PCN reaction that required hospitalization No Has patient had a PCN reaction occurring within the last 10 years: No If all of the above answers are "NO", then may proceed with Cephalosporin use.  [name] Percocet [Oxycodone-Acetaminophen] Nausea Only  . Tramadol Nausea Only  . Vicodin [Hydrocodone-Acetaminophen] Nausea Only   Family History  Problem Relation Age of Onset  . Breast cancer Sister 38  . Diabetes Sister   . Diabetes Mother   . Hypertension Mother   . Hyperlipidemia Mother   . Eating disorder Mother   . Obesity Mother     Current Outpatient Medications (Endocrine & Metabolic):  [name]  Dulaglutide (TRULICITY) 1.5 WU/9.8JX SOPN, Inject 1.5 mg into the skin once  a week. .  insulin aspart (NOVOLOG FLEXPEN) 100 UNIT/ML FlexPen, Inject 5 Units into the skin 3 (three) times daily with meals.  Current Outpatient Medications (Cardiovascular):  .  amLODipine (NORVASC) 10 MG tablet, Take 10 mg by mouth daily. .  cloNIDine (CATAPRES) 0.3 MG tablet, Take 0.3 mg by mouth 2 (two) times daily. .  furosemide (LASIX) 20 MG tablet, Take 20 mg by mouth daily. .  isosorbide mononitrate (IMDUR) 120 MG 24 hr tablet, Take 120 mg by mouth daily. [name]  spironolactone (ALDACTONE) 50 MG tablet, Take 50 mg by mouth daily.  Current Outpatient Medications (Respiratory):  .  albuterol (PROVENTIL HFA;VENTOLIN HFA) 108 (90 Base) MCG/ACT inhaler, Inhale 1-2 puffs into the lungs every 6 (six) hours as needed for wheezing or shortness of breath.  Current Outpatient Medications (Analgesics):  .  aspirin EC 325 MG tablet, Take 325 mg by mouth daily.  Current Outpatient Medications (Hematological):  .  ferrous sulfate 325 (65 FE) MG tablet, Take 325 mg by mouth 3 (three) times daily with meals.  Current Outpatient Medications (Other):  .  Blood Glucose Monitoring Suppl (ONE TOUCH ULTRA 2) w/Device KIT, 1 Device by Does not apply route daily. .  Continuous Blood Gluc Sensor (FREESTYLE LIBRE 14 DAY SENSOR) MISC, 1 Device by Does not apply route every 14 (fourteen) days. [name]  gabapentin (NEURONTIN) 600 MG tablet, Take 600 mg by mouth at bedtime. [name]  glucose blood (ONE TOUCH ULTRA TEST) test strip, Used to check blood sugars four times daily. .  Lancets (ONETOUCH ULTRASOFT) lancets, Used to check blood sugars four times daily. .  Multiple Vitamin (MULTIVITAMIN WITH MINERALS) TABS tablet, Take 1 tablet by mouth daily. [name]  omeprazole (PRILOSEC) 40 MG capsule, Take 40 mg by mouth 2 (two) times daily. .  ranitidine (ZANTAC) 300 MG tablet, Take 300 mg by mouth at bedtime. .  Vitamin D, Ergocalciferol, (DRISDOL) 50000 units CAPS capsule, Take 1 capsule (50,000 Units total) by mouth every 7 (seven)  days.    Past medical history, social, surgical and family history all reviewed in electronic medical record.  No pertanent information unless stated regarding to the chief complaint.   Review of Systems:  No headache, visual changes, nausea, vomiting, diarrhea, constipation, dizziness, abdominal pain, skin rash, fevers, chills, night sweats, weight loss, swollen lymph nodes,   chest pain, shortness of breath, mood changes.  Positive muscle aches, joint swelling, body aches  Objective  Blood pressure 138/88, pulse 77, height '5\' 6"'$  (1.676 m), weight 298 lb (135.2 kg), SpO2 98 %.    General:  No apparent distress alert and oriented x3 mood and affect normal, dressed appropriately.  HEENT: Pupils equal, extraocular movements intact  Respiratory: Patient's speak in full sentences and does not appear short of breath  Cardiovascular: 2+ lower extremity edema, non tender, no erythema  Skin: Warm dry intact with no signs of infection or rash on extremities or on axial skeleton.  Abdomen: Soft nontender morbidly obese Neuro: Cranial nerves II through XII are intact, neurovascularly intact in all extremities with 2+ DTRs and 2+ pulses.  Lymph: No lymphadenopathy of posterior or anterior cervical chain or axillae bilaterally.  Gait antalgic MSK:  tender with full range of motion and good stability and symmetric strength and tone of shoulders, elbows, wrist, hip, and ankles bilaterally.  Knee: Right valgus deformity noted. Large thigh to calf ratio.  Tender to palpation over medial and PF joint line.  ROM full in flexion and extension and lower leg rotation. instability with valgus force.  painful patellar compression. Patellar glide with moderate crepitus. Patellar and quadriceps tendons unremarkable. Hamstring and quadriceps strength is normal. Contralateral knee shows arthritic changes as well but tender and minimal instability  After informed written and verbal consent, patient was seated on  exam table. Right knee was prepped with alcohol swab and utilizing anterolateral approach, patient's right knee space was injected with 4:1  marcaine 0.5%: Kenalog 38m/dL. Patient tolerated the procedure well without immediate complications.      Impression and Recommendations:     The above documentation has been reviewed and is accurate and complete ZLyndal Pulley DO       Note: This dictation was prepared with Dragon dictation along with smaller phrase technology. Any transcriptional errors that result from this process are unintentional.

## 2018-08-27 ENCOUNTER — Encounter: Payer: Self-pay | Admitting: Family Medicine

## 2018-08-27 ENCOUNTER — Encounter: Payer: Self-pay | Admitting: Orthotics

## 2018-08-27 ENCOUNTER — Ambulatory Visit (INDEPENDENT_AMBULATORY_CARE_PROVIDER_SITE_OTHER): Payer: Medicare HMO | Admitting: Family Medicine

## 2018-08-27 ENCOUNTER — Ambulatory Visit (INDEPENDENT_AMBULATORY_CARE_PROVIDER_SITE_OTHER): Payer: Medicare HMO | Admitting: Orthotics

## 2018-08-27 DIAGNOSIS — M14672 Charcot's joint, left ankle and foot: Secondary | ICD-10-CM | POA: Diagnosis not present

## 2018-08-27 DIAGNOSIS — M1711 Unilateral primary osteoarthritis, right knee: Secondary | ICD-10-CM

## 2018-08-27 DIAGNOSIS — M2141 Flat foot [pes planus] (acquired), right foot: Secondary | ICD-10-CM

## 2018-08-27 DIAGNOSIS — M779 Enthesopathy, unspecified: Secondary | ICD-10-CM

## 2018-08-27 DIAGNOSIS — M2142 Flat foot [pes planus] (acquired), left foot: Secondary | ICD-10-CM

## 2018-08-27 DIAGNOSIS — E1142 Type 2 diabetes mellitus with diabetic polyneuropathy: Secondary | ICD-10-CM

## 2018-08-27 NOTE — Assessment & Plan Note (Signed)
Patient given injection today.  Tolerated the procedure well.  We discussed with patient at this time I do feel that surgical intervention may be necessary.  With patient's body habitus he will be a high risk patient.  Patient encouraged to potentially lose weight and we did discuss possible referral for health management.  Patient will increase activity slowly otherwise.  Follow-up again 12 weeks

## 2018-08-27 NOTE — Patient Instructions (Signed)
Good to see you  Ice is your friend pennsaid pinkie amount topically 2 times daily as needed.  Injected the knee again today  Dr. Rush Farmer at [facility] would do a good knee for you  See me again in 2 months

## 2018-08-28 NOTE — Progress Notes (Signed)
Patient came in today to pick up standard Afo brace.  Patient was evaluated for fit and function.   The brace fit very well and there were any complaints of the way it felt once donned.  The brace offered ankle stability in both saggital and coroneal planes.  Patient advised to always wear proper fitting shoes with brace. 

## 2018-08-31 ENCOUNTER — Ambulatory Visit (INDEPENDENT_AMBULATORY_CARE_PROVIDER_SITE_OTHER): Payer: Medicare HMO | Admitting: Family Medicine

## 2018-08-31 VITALS — BP 153/76 | HR 85 | Temp 98.1°F | Ht 66.0 in | Wt 281.0 lb

## 2018-08-31 DIAGNOSIS — E1129 Type 2 diabetes mellitus with other diabetic kidney complication: Secondary | ICD-10-CM

## 2018-08-31 DIAGNOSIS — N184 Chronic kidney disease, stage 4 (severe): Secondary | ICD-10-CM

## 2018-08-31 DIAGNOSIS — Z794 Long term (current) use of insulin: Secondary | ICD-10-CM

## 2018-08-31 DIAGNOSIS — I10 Essential (primary) hypertension: Secondary | ICD-10-CM

## 2018-08-31 DIAGNOSIS — Z6841 Body Mass Index (BMI) 40.0 and over, adult: Secondary | ICD-10-CM

## 2018-09-02 ENCOUNTER — Telehealth: Payer: Self-pay | Admitting: Orthotics

## 2018-09-02 NOTE — Progress Notes (Signed)
Office: [phone]  /  Fax: [phone]   HPI:   Chief Complaint: OBESITY Rachel Holmes is here to discuss her progress with her obesity treatment plan. She is just cutting back and watching what she eats at this time. She states she is exercising 0 minutes 0 times per week.Rachel Holmes was advised to keep a food journal with calories and protein goals but she states she just can't do that. Instead she tried to [facility] and has done well with weight loss.   Her weight is 281 lb (127.5 kg) today and has had a weight loss of 11 pounds over a period of 3 weeks since her last visit. She has lost 20 lbs since starting treatment with [name].  Hypertension Rachel Holmes is a 57 y.o. female with hypertension.  Rachel Holmes denies chest pain or shortness of breath on exertion. She is working weight loss to help control her blood pressure with the goal of decreasing her risk of heart attack and stroke. Rachel Holmes blood pressure is not currently controlled on Amlodipine, Clonidine, Lasix and Spironolactone and Isosorbide Dinitrate. She states her foot pain is worse and this is contributing to her elevated blood pressure.   Diabetes II Rachel Holmes has a diagnosis of diabetes type II. Rachel Holmes states BGs range between 130 and 150 and denies any hypoglycemic episodes. Her post prandial range from 200-250. Her Trulicity was increased to 1.0 last visit.  She is not takingvarious herbal and vitamin products,  Last A1c was Hemoglobin A1C Latest Ref Rng & Units 07/21/2018 05/19/2018 02/13/2018 12/05/2017 10/02/2017  HGBA1C 4.0 - 5.6 % 10.1(A) 8.5(A) 7.2 7.2 7.3  Some recent data might be hidden    She has been working on intensive lifestyle modifications including diet, exercise, and weight loss to help control her blood glucose levels.  CRI (Stage 4) Patient was referred to Nephro for GFR @ 20 and is working on Diabetes Mellitus control. She states she was not contacted and has not seen them yet. She is still urinating regularly.     ALLERGIES: Allergies  Allergen Reactions  . Statins Shortness Of Breath    Wheezing, short of breath  . Codeine Nausea Only  . Ibuprofen Other (See Comments)    Reaction:  Raises pts BP  . Penicillins Hives and Other (See Comments)    Has patient had a PCN reaction causing immediate rash, facial/tongue/throat swelling, SOB or lightheadedness with hypotension: No Has patient had a PCN reaction causing severe rash involving mucus membranes or skin necrosis: No Has patient had a PCN reaction that required hospitalization No Has patient had a PCN reaction occurring within the last 10 years: No If all of the above answers are "NO", then may proceed with Cephalosporin use.  [name] Percocet [Oxycodone-Acetaminophen] Nausea Only  . Tramadol Nausea Only  . Vicodin [Hydrocodone-Acetaminophen] Nausea Only    MEDICATIONS: Current Outpatient Medications on File Prior to Visit  Medication Sig Dispense Refill  . albuterol (PROVENTIL HFA;VENTOLIN HFA) 108 (90 Base) MCG/ACT inhaler Inhale 1-2 puffs into the lungs every 6 (six) hours as needed for wheezing or shortness of breath.    [name] amLODipine (NORVASC) 10 MG tablet Take 10 mg by mouth daily.    [name] aspirin EC 325 MG tablet Take 325 mg by mouth daily.    . Blood Glucose Monitoring Suppl (ONE TOUCH ULTRA 2) w/Device KIT 1 Device by Does not apply route daily. 1 each 0  . cloNIDine (CATAPRES) 0.3 MG tablet Take 0.3 mg by mouth 2 (two)  times daily.    . Continuous Blood Gluc Sensor (FREESTYLE LIBRE 14 DAY SENSOR) MISC 1 Device by Does not apply route every 14 (fourteen) days. 6 each 3  . Dulaglutide (TRULICITY) 1.5 ZS/0.1UX SOPN Inject 1.5 mg into the skin once a week. 4 pen 11  . ferrous sulfate 325 (65 FE) MG tablet Take 325 mg by mouth 3 (three) times daily with meals.    . furosemide (LASIX) 20 MG tablet Take 20 mg by mouth daily.    [name] gabapentin (NEURONTIN) 600 MG tablet Take 600 mg by mouth at bedtime.    [name] glucose blood (ONE TOUCH ULTRA TEST) test  strip Used to check blood sugars four times daily. 150 each 12  . insulin aspart (NOVOLOG FLEXPEN) 100 UNIT/ML FlexPen Inject 5 Units into the skin 3 (three) times daily with meals. 5 pen 11  . isosorbide mononitrate (IMDUR) 120 MG 24 hr tablet Take 120 mg by mouth daily.    . Lancets (ONETOUCH ULTRASOFT) lancets Used to check blood sugars four times daily. 200 each 12  . Multiple Vitamin (MULTIVITAMIN WITH MINERALS) TABS tablet Take 1 tablet by mouth daily.    [name] omeprazole (PRILOSEC) 40 MG capsule Take 40 mg by mouth 2 (two) times daily.    . ranitidine (ZANTAC) 300 MG tablet Take 300 mg by mouth at bedtime.    [name] spironolactone (ALDACTONE) 50 MG tablet Take 50 mg by mouth daily.    . Vitamin D, Ergocalciferol, (DRISDOL) 50000 units CAPS capsule Take 1 capsule (50,000 Units total) by mouth every 7 (seven) days. 4 capsule 0   No current facility-administered medications on file prior to visit.     PAST MEDICAL HISTORY: Past Medical History:  Diagnosis Date  . Allergic rhinitis   . Anemia   . Anxiety   . Back pain   . Bradycardia   . Breast mass    Patient can no longer palpate specific masses but showed tech general area of concern  . CHF (congestive heart failure) ([place])   . CKD (chronic kidney disease)    STAGE 3  . Constipation   . Diabetes mellitus without complication ([place])   . Dyspnea   . GERD (gastroesophageal reflux disease)   . HLD (hyperlipidemia)   . Hyperparathyroidism ([place])   . Hypertension   . Joint pain   . Leg edema   . Legally blind in left eye, as defined in [place]   . Lymphedema   . Onychomycosis     PAST SURGICAL HISTORY: Past Surgical History:  Procedure Laterality Date  . ABDOMINAL HYSTERECTOMY    . BREAST BIOPSY Left 2014   FNA 12:00 position - Negative  . EYE SURGERY      SOCIAL HISTORY: Social History   Tobacco Use  . Smoking status: Never Smoker  . Smokeless tobacco: Never Used  Substance Use Topics  . Alcohol use: No  . Drug use: Never     FAMILY HISTORY: Family History  Problem Relation Age of Onset  . Breast cancer Sister 13  . Diabetes Sister   . Diabetes Mother   . Hypertension Mother   . Hyperlipidemia Mother   . Eating disorder Mother   . Obesity Mother     ROS: Review of Systems  Constitutional: Positive for weight loss.  All other systems reviewed and are negative.   PHYSICAL EXAM: Blood pressure (!) 153/76, pulse 85, temperature 98.1 F (36.7 C), temperature source Oral, height _0  (1.676 m), weight 281 lb (  127.5 kg), SpO2 100 %. Body mass index is 45.35 kg/m. Physical Exam  Constitutional: She is oriented to person, place, and time. She appears well-developed and well-nourished.  HENT:  Head: Normocephalic.  Eyes: Pupils are equal, round, and reactive to light.  Neck: Normal range of motion.  Pulmonary/Chest: Effort normal.  Neurological: She is alert and oriented to person, place, and time.  Skin: Skin is warm and dry.  Psychiatric: She has a normal mood and affect. Her behavior is normal.  Vitals reviewed.   RECENT LABS AND TESTS: BMET    Component Value Date/Time   NA 139 06/25/2018 1050   NA 137 11/21/2013 0627   K 5.6 (H) 06/25/2018 1050   K 4.6 11/21/2013 0627   CL 103 06/25/2018 1050   CL 107 11/21/2013 0627   CO2 21 06/25/2018 1050   CO2 24 11/21/2013 0627   GLUCOSE 248 (H) 06/25/2018 1050   GLUCOSE 86 02/03/2017 1119   GLUCOSE 95 11/21/2013 0627   BUN 43 (H) 06/25/2018 1050   BUN 34 (H) 11/21/2013 0627   CREATININE 2.59 (H) 06/25/2018 1050   CREATININE 1.80 (H) 09/09/2014 1220   CALCIUM 9.0 06/25/2018 1050   CALCIUM 8.8 09/09/2014 1220   GFRNONAA 20 (L) 06/25/2018 1050   GFRNONAA 31 (L) 09/09/2014 1220   GFRNONAA 26 (L) 11/21/2013 0627   GFRAA 23 (L) 06/25/2018 1050   GFRAA 38 (L) 09/09/2014 1220   GFRAA 30 (L) 11/21/2013 0627   Lab Results  Component Value Date   HGBA1C 10.1 (A) 07/21/2018   HGBA1C 8.5 (A) 05/19/2018   HGBA1C 7.2 02/13/2018   HGBA1C 7.2  12/05/2017   HGBA1C 7.3 10/02/2017   No results found for: INSULIN CBC    Component Value Date/Time   WBC 6.1 06/25/2018 1050   WBC 7.8 02/03/2017 1119   RBC 3.47 (L) 06/25/2018 1050   RBC 4.26 02/03/2017 1119   HGB 9.7 (L) 06/25/2018 1050   HCT 30.3 (L) 06/25/2018 1050   PLT 230 02/03/2017 1119   PLT 210 09/09/2014 1220   MCV 87 06/25/2018 1050   MCV 87 09/09/2014 1220   MCH 28.0 06/25/2018 1050   MCH 28.2 02/03/2017 1119   MCHC 32.0 06/25/2018 1050   MCHC 32.4 02/03/2017 1119   RDW 14.0 06/25/2018 1050   RDW 15.1 (H) 09/09/2014 1220   LYMPHSABS 1.9 06/25/2018 1050   LYMPHSABS 2.0 09/09/2014 1220   MONOABS 0.4 02/03/2017 1119   MONOABS 0.5 09/09/2014 1220   EOSABS 0.2 06/25/2018 1050   EOSABS 0.4 09/09/2014 1220   BASOSABS 0.1 06/25/2018 1050   BASOSABS 0.1 09/09/2014 1220   Iron/TIBC/Ferritin/ %Sat    Component Value Date/Time   IRON 39 (L) 10/07/2014 1527   TIBC 202 (L) 10/07/2014 1527   FERRITIN 176 08/04/2014 1527   IRONPCTSAT 19 10/07/2014 1527   Lipid Panel     Component Value Date/Time   CHOL 191 06/25/2018 1050   CHOL 157 11/20/2013 0954   TRIG 104 06/25/2018 1050   TRIG 114 11/20/2013 0954   HDL 47 06/25/2018 1050   HDL 38 (L) 11/20/2013 0954   VLDL 23 11/20/2013 0954   LDLCALC 123 (H) 06/25/2018 1050   LDLCALC 96 11/20/2013 0954   Hepatic Function Panel     Component Value Date/Time   PROT 6.4 06/25/2018 1050   PROT 7.9 11/19/2013 0934   ALBUMIN 3.5 06/25/2018 1050   ALBUMIN 3.2 (L) 11/19/2013 0934   AST 13 06/25/2018 1050   AST 19 11/19/2013  0934   ALT 10 06/25/2018 1050   ALT 20 11/19/2013 0934   ALKPHOS 124 (H) 06/25/2018 1050   ALKPHOS 95 11/19/2013 0934   BILITOT 0.3 06/25/2018 1050   BILITOT 0.3 11/19/2013 0934      Component Value Date/Time   TSH 1.170 03/11/2018 1002   TSH 1.42 11/20/2013 0954    ASSESSMENT AND PLAN: Essential hypertension  Type 2 diabetes mellitus with other diabetic kidney complication, with long-term  current use of insulin (HCC)  Chronic renal impairment, stage 4 (severe) (HCC)  Class 3 severe obesity with serious comorbidity and body mass index (BMI) of 45.0 to 49.9 in adult, unspecified obesity type ([place])  PLAN: Hypertension We discussed sodium restriction, working on healthy weight loss, and a regular exercise program as the means to achieve improved blood pressure control. Rachel Holmes agreed with this plan and agreed to follow up as directed. We will continue to monitor her blood pressure as well as her progress with the above lifestyle modifications. She will continue her medications as prescribed and will watch for signs of hypotension as she continues her lifestyle modifications. Patient will control her blood pressure with weight loss effort and will recheck it in three weeks.   Diabetes II Rachel Holmes has been given extensive diabetes education by myself today including ideal fasting and post-prandial blood glucose readings, individual ideal HgA1c goals  and hypoglycemia prevention. We discussed the importance of good blood sugar control to decrease the likelihood of diabetic complications such as nephropathy, neuropathy, limb loss, blindness, coronary artery disease, and death. We discussed the importance of intensive lifestyle modification including diet, exercise and weight loss as the first line treatment for diabetes. Rachel Holmes agrees to continue her diabetes medications and will follow up at the agreed upon time. Patient asked again to bring in her blood sugar log, continue her medications and decrease simple carbohydrates.    CRI (stage 4) Will re-refer to Nerphology and continue to work on weight loss as well as blood pressure control.   Obesity Rachel Holmes is currently in the action stage of change. As such, her goal is to continue with weight loss efforts She has agreed to follow the [facility] plan.  Rachel Holmes has been instructed to work up to a goal of 150 minutes of combined cardio and strengthening  exercise per week for weight loss and overall health benefits. We discussed the following Behavioral Modification Stratagies today: increasing lean protein intake, increasing vegetables and decreasing sodium intake, and no skipping meals.    Rachel Holmes has agreed to follow up with our clinic in 3 weeks. She was informed of the importance of frequent follow up visits to maximize her success with intensive lifestyle modifications for her multiple health conditions.  I spent > than 50% of the 25 minute visit on counseling as documented in the note.   OBESITY BEHAVIORAL INTERVENTION VISIT  Today's visit was # 9   Starting weight: 301 lbs Starting date: 03/11/18 Today's weight : Weight: 281 lb (127.5 kg)  Today's date: 08/31/18 Total lbs lost to date: 20    ASK: We discussed the diagnosis of obesity with Rachel Holmes today and Rachel Holmes agreed to give [name] permission to discuss obesity behavioral modification therapy today.  ASSESS: Rachel Holmes has the diagnosis of obesity and her BMI today is 45.4 Rachel Holmes is in the action stage of change   ADVISE: Rachel Holmes was educated on the multiple health risks of obesity as well as the benefit of weight loss to improve her health. She was  advised of the need for long term treatment and the importance of lifestyle modifications to improve her current health and to decrease her risk of future health problems.  AGREE: Multiple dietary modification options and treatment options were discussed and  Rachel Holmes agreed to follow the recommendations documented in the above note.  ARRANGE: Rachel Holmes was educated on the importance of frequent visits to treat obesity as outlined per CMS and USPSTF guidelines and agreed to schedule her next follow up appointment today.  I, April Moore, am acting as Location manager for Dr Dennard Nip.   I have reviewed the above documentation for accuracy and completeness, and I agree with the above. -Dennard Nip, MD

## 2018-09-02 NOTE — Telephone Encounter (Signed)
Patient called and I returned call concerning her brace.   She said she had significant swelling since getting brace and it did not fit well.  She was wondering if wearing compression hose would help.  Told her I thought that should help control the swelling, but if she wanted to come in and I evaluate I would be more than willing to do so.  She said she would make an appointmenr.

## 2018-09-04 ENCOUNTER — Ambulatory Visit: Payer: Medicare HMO | Admitting: Family Medicine

## 2018-09-04 ENCOUNTER — Encounter: Payer: Self-pay | Admitting: Podiatry

## 2018-09-04 ENCOUNTER — Ambulatory Visit (INDEPENDENT_AMBULATORY_CARE_PROVIDER_SITE_OTHER): Payer: Medicare HMO | Admitting: Podiatry

## 2018-09-04 DIAGNOSIS — B351 Tinea unguium: Secondary | ICD-10-CM

## 2018-09-04 DIAGNOSIS — E1142 Type 2 diabetes mellitus with diabetic polyneuropathy: Secondary | ICD-10-CM

## 2018-09-04 DIAGNOSIS — M79674 Pain in right toe(s): Secondary | ICD-10-CM

## 2018-09-04 DIAGNOSIS — M79675 Pain in left toe(s): Secondary | ICD-10-CM | POA: Diagnosis not present

## 2018-09-04 NOTE — Progress Notes (Signed)
Complaint:  Visit Type: Patient returns to my office for continued preventative foot care services. Complaint: Patient states" my nails have grown long and thick and become painful to walk and wear shoes" Patient has been diagnosed with DM with no foot complications. The patient presents for preventative foot care services. No changes to ROS  Podiatric Exam: Vascular: dorsalis pedis  are palpable bilateral. Posterior tibial pulses are absent  B/L due to swelling. Capillary return is immediate. Temperature gradient is WNL. Skin turgor WNL  Sensorium: Diminished  Semmes Weinstein monofilament test. Normal tactile sensation bilaterally. Nail Exam: Pt has thick disfigured discolored nails with subungual debris noted bilateral entire nail hallux through fifth toenails Ulcer Exam: There is no evidence of ulcer or pre-ulcerative changes or infection. Orthopedic Exam: Muscle tone and strength are WNL. No limitations in general ROM. No crepitus or effusions noted. Foot type and digits show no abnormalities. Bony prominences are unremarkable. Skin: No Porokeratosis. No infection or ulcers  Diagnosis:  Onychomycosis, , Pain in right toe, pain in left toes  Treatment & Plan Procedures and Treatment: Consent by patient was obtained for treatment procedures.   Debridement of mycotic and hypertrophic toenails, 1 through 5 bilateral and clearing of subungual debris. No ulceration, no infection noted. Patient has two distinct healing hematomas on her lesser toes  B/L.  Patient to see Liliane Channel on 11/11. Return Visit-Office Procedure: Patient instructed to return to the office for a follow up visit 3 months for continued evaluation and treatment.    Gardiner Barefoot DPM

## 2018-09-07 ENCOUNTER — Telehealth: Payer: Self-pay | Admitting: Podiatry

## 2018-09-07 ENCOUNTER — Encounter: Payer: Self-pay | Admitting: *Deleted

## 2018-09-07 NOTE — Telephone Encounter (Signed)
Pt states her foot and ankle are swollen and painful and she can not walk or work, and can't go to work today. I offered pt an appt to discuss this with Dr. Paulla Dolly and she states she has an appt on Wednesday. Dr. Paulla Dolly states pt may be out of work until reevaluated at Wednesday's appt.

## 2018-09-07 NOTE — Telephone Encounter (Signed)
I'm having a hard time walking on my foot. Also, I need to talk to the nurse about a note about being out of work. My number is [phone].

## 2018-09-09 ENCOUNTER — Encounter: Payer: Self-pay | Admitting: Podiatry

## 2018-09-09 ENCOUNTER — Ambulatory Visit: Payer: Medicare HMO | Admitting: Podiatry

## 2018-09-09 ENCOUNTER — Other Ambulatory Visit: Payer: Self-pay | Admitting: Podiatry

## 2018-09-09 ENCOUNTER — Ambulatory Visit (INDEPENDENT_AMBULATORY_CARE_PROVIDER_SITE_OTHER): Payer: Medicare HMO

## 2018-09-09 DIAGNOSIS — M79672 Pain in left foot: Secondary | ICD-10-CM

## 2018-09-09 DIAGNOSIS — M14672 Charcot's joint, left ankle and foot: Secondary | ICD-10-CM

## 2018-09-11 NOTE — Progress Notes (Signed)
Subjective:   Patient ID: Rondel Jumbo, female   DOB: 57 y.o.   MRN: [id number]   HPI Patient presents with severe foot deformity left with active Charcot that was doing well with Milinda Cave walker but the patient unfortunately had an injury when she walked without it and felt a pop in her foot   ROS      Objective:  Physical Exam  Neurovascular status intact with significant swelling of the left foot that appears to be related to the stress that is on her foot secondary to Charcot foot structure.  There is no breakage of skin or indications of cellulitic event and no increased redness     Assessment:  Charcot foot that is very active left with probable further trauma secondary to not wearing her crow walker     Plan:  H&P condition reviewed and at this point reviewed x-ray and did discuss this is can require surgical intervention and we will refer her to [facility] for this.  I am ordering CT scan to better understand the deformity and that decision will be made at that time.  She will wear her boot and will reduce her activity and will be seen back when we get results of CT scan  X-ray indicates that there is severe midfoot breakdown left with what appears to be a talus that has fractured

## 2018-09-14 ENCOUNTER — Ambulatory Visit: Payer: Medicare HMO | Admitting: Orthotics

## 2018-09-14 DIAGNOSIS — M79674 Pain in right toe(s): Secondary | ICD-10-CM

## 2018-09-14 DIAGNOSIS — M79672 Pain in left foot: Secondary | ICD-10-CM

## 2018-09-14 DIAGNOSIS — B351 Tinea unguium: Secondary | ICD-10-CM

## 2018-09-14 DIAGNOSIS — M79675 Pain in left toe(s): Secondary | ICD-10-CM

## 2018-09-14 DIAGNOSIS — E1142 Type 2 diabetes mellitus with diabetic polyneuropathy: Secondary | ICD-10-CM

## 2018-09-14 NOTE — Progress Notes (Signed)
Patient has a lateral blister/ulcer  inferior to lateral malleous due to a lateral ankle laxity/instability.  Dr. Paulla Dolly is referring there to [facility] for evaluation/possible surgery.   adapations where made to az brace to offer support and offload area of concern.  Also a lateral wedge was added.

## 2018-09-16 ENCOUNTER — Ambulatory Visit
Admission: RE | Admit: 2018-09-16 | Discharge: 2018-09-16 | Disposition: A | Discharge status: Home / Self Care | Payer: Medicare HMO | Source: Ambulatory Visit | Attending: Podiatry | Admitting: Podiatry

## 2018-09-16 DIAGNOSIS — M79672 Pain in left foot: Secondary | ICD-10-CM

## 2018-09-16 DIAGNOSIS — M14672 Charcot's joint, left ankle and foot: Secondary | ICD-10-CM

## 2018-09-17 NOTE — Progress Notes (Signed)
This patient needs to see Dr. Karolee Ohs friend at [facility] for surgical consult. I sent him a note to help facilitate

## 2018-09-18 NOTE — Progress Notes (Signed)
Patient presents today for evaluation/casting for AFO brace (L).   Patient has hx of the following conditions: Gait instability,  Ankle instabilty,  Gait analysis done and patient displays abnormality of gait in both sagittial and frontal planes, and could benefit in aggressive ankle support.  Patient chose [place] brace w/ lace/speed laces.  

## 2018-09-21 ENCOUNTER — Ambulatory Visit (INDEPENDENT_AMBULATORY_CARE_PROVIDER_SITE_OTHER): Payer: Medicare HMO | Admitting: Family Medicine

## 2018-09-21 VITALS — BP 169/72 | HR 74 | Temp 97.6°F | Ht 66.0 in | Wt 280.0 lb

## 2018-09-21 DIAGNOSIS — Z794 Long term (current) use of insulin: Secondary | ICD-10-CM | POA: Diagnosis not present

## 2018-09-21 DIAGNOSIS — E119 Type 2 diabetes mellitus without complications: Secondary | ICD-10-CM

## 2018-09-21 DIAGNOSIS — E559 Vitamin D deficiency, unspecified: Secondary | ICD-10-CM | POA: Diagnosis not present

## 2018-09-21 DIAGNOSIS — Z6841 Body Mass Index (BMI) 40.0 and over, adult: Secondary | ICD-10-CM

## 2018-09-21 MED ORDER — VITAMIN D (ERGOCALCIFEROL) 1.25 MG (50000 UNIT) PO CAPS: 50000.0000 [IU] | ORAL_CAPSULE | ORAL | 0 refills | Status: DC

## 2018-09-23 ENCOUNTER — Telehealth: Payer: Self-pay | Admitting: *Deleted

## 2018-09-23 ENCOUNTER — Telehealth: Payer: Self-pay | Admitting: Podiatry

## 2018-09-23 DIAGNOSIS — M79672 Pain in left foot: Secondary | ICD-10-CM

## 2018-09-23 DIAGNOSIS — M14672 Charcot's joint, left ankle and foot: Secondary | ICD-10-CM

## 2018-09-23 DIAGNOSIS — E1142 Type 2 diabetes mellitus with diabetic polyneuropathy: Secondary | ICD-10-CM

## 2018-09-23 NOTE — Telephone Encounter (Signed)
Faxed referral, clinicals and demographics to [facility] - Dr. Mali Blazek.

## 2018-09-23 NOTE — Telephone Encounter (Signed)
Referral to [facility] made in Patient Calls message.

## 2018-09-23 NOTE — Telephone Encounter (Signed)
I informed pt of Dr. Mellody Drown review of results and orders to Dr. Loletta Specter at [facility]. I told pt is would send the referral to [facility] agency and that if she did not have a skilled nursing diagnosis/order her insurance may not cover the [facility] aide. Pt states understanding.

## 2018-09-23 NOTE — Telephone Encounter (Signed)
-----   Message from Evelina Bucy, DPM sent at 09/22/2018  2:38 PM EST ----- Can you refer to Dr. Loletta Specter at [facility]?  ----- Message ----- From: Andres Ege, RN Sent: 09/22/2018   1:51 PM EST To: Evelina Bucy, DPM  Dr. March Rummage, please advise. Marcy Siren ----- Message ----- From: Wallene Huh, DPM Sent: 09/17/2018   9:34 AM EST To: Andres Ege, RN  This patient needs to see Dr. Karolee Ohs friend at [facility] for surgical consult. I sent him a note to help facilitate

## 2018-09-23 NOTE — Telephone Encounter (Signed)
-----   Message from Evelina Bucy, DPM sent at 09/22/2018  2:38 PM EST ----- Can you refer to Dr. Loletta Specter at [facility]?  ----- Message ----- From: Andres Ege, RN Sent: 09/22/2018   1:51 PM EST To: Evelina Bucy, DPM  Dr. March Rummage, please advise. Marcy Siren ----- Message ----- From: Wallene Huh, DPM Sent: 09/17/2018   9:34 AM EST To: Andres Ege, RN  This patient needs to see Dr. Karolee Ohs friend at [facility] for surgical consult. I sent him a note to help facilitate

## 2018-09-23 NOTE — Progress Notes (Signed)
Office: [phone]  /  Fax: [phone]   HPI:   Chief Complaint: OBESITY Rachel Holmes is here to discuss her progress with her obesity treatment plan. Rachel Holmes is on the portion control better and make smarter food choices, such as increase vegetables and decrease simple carbohydrates and is following her eating plan approximately 80 % of the time. Rachel Holmes states Rachel Holmes is exercising 0 minutes 0 times per week. Rachel Holmes has mostly been portion controlling and making smarter food choices. Rachel Holmes is not journaling but Rachel Holmes is trying ro be mindful and eat healthier. Rachel Holmes is unable to ambulate well due to charcot joint and multiple foot fractures, for which Rachel Holmes is seeing orthopedic. Her weight is 280 lb (127 kg) today and has had a weight loss of 1 pound over a period of 3 weeks since her last visit. Rachel Holmes has lost 21 lbs since starting treatment with [name].  Vitamin D Deficiency Rachel Holmes has a diagnosis of vitamin D deficiency. Rachel Holmes is stable on prescription Vit D, but level is not yet at goal. Rachel Holmes denies nausea, vomiting or muscle weakness.  Diabetes II Rachel Holmes has a diagnosis of diabetes type II. Rachel Holmes did not bring BGs log today. Rachel Holmes states BGs ranges between 69 and 310, but mostly under 200's fasting. Last A1c was 10.1. Rachel Holmes has been working on intensive lifestyle modifications including diet, exercise, and weight loss to help control her blood glucose levels.  ALLERGIES: Allergies  Allergen Reactions  . Statins Shortness Of Breath    Wheezing, short of breath  . Codeine Nausea Only  . Ibuprofen Other (See Comments)    Reaction:  Raises pts BP  . Penicillins Hives and Other (See Comments)    Has patient had a PCN reaction causing immediate rash, facial/tongue/throat swelling, SOB or lightheadedness with hypotension: No Has patient had a PCN reaction causing severe rash involving mucus membranes or skin necrosis: No Has patient had a PCN reaction that required hospitalization No Has patient had a PCN reaction occurring  within the last 10 years: No If all of the above answers are "NO", then may proceed with Cephalosporin use.  [name] Percocet [Oxycodone-Acetaminophen] Nausea Only  . Tramadol Nausea Only  . Vicodin [Hydrocodone-Acetaminophen] Nausea Only    MEDICATIONS: Current Outpatient Medications on File Prior to Visit  Medication Sig Dispense Refill  . albuterol (PROVENTIL HFA;VENTOLIN HFA) 108 (90 Base) MCG/ACT inhaler Inhale 1-2 puffs into the lungs every 6 (six) hours as needed for wheezing or shortness of breath.    [name] amLODipine (NORVASC) 10 MG tablet Take 10 mg by mouth daily.    [name] aspirin EC 325 MG tablet Take 325 mg by mouth daily.    . Blood Glucose Monitoring Suppl (ONE TOUCH ULTRA 2) w/Device KIT 1 Device by Does not apply route daily. 1 each 0  . cloNIDine (CATAPRES) 0.3 MG tablet Take 0.3 mg by mouth 2 (two) times daily.    . Continuous Blood Gluc Sensor (FREESTYLE LIBRE 14 DAY SENSOR) MISC 1 Device by Does not apply route every 14 (fourteen) days. 6 each 3  . Dulaglutide (TRULICITY) 1.5 XI/3.3AS SOPN Inject 1.5 mg into the skin once a week. 4 pen 11  . DULoxetine (CYMBALTA) 30 MG capsule     . ferrous sulfate 325 (65 FE) MG tablet Take 325 mg by mouth 3 (three) times daily with meals.    . furosemide (LASIX) 20 MG tablet Take 20 mg by mouth daily.    [name] gabapentin (NEURONTIN) 600 MG tablet Take 600 mg  by mouth at bedtime.    [name] glucose blood (ONE TOUCH ULTRA TEST) test strip Used to check blood sugars four times daily. 150 each 12  . insulin aspart (NOVOLOG FLEXPEN) 100 UNIT/ML FlexPen Inject 5 Units into the skin 3 (three) times daily with meals. 5 pen 11  . isosorbide mononitrate (IMDUR) 120 MG 24 hr tablet Take 120 mg by mouth daily.    . Lancets (ONETOUCH ULTRASOFT) lancets Used to check blood sugars four times daily. 200 each 12  . Multiple Vitamin (MULTIVITAMIN WITH MINERALS) TABS tablet Take 1 tablet by mouth daily.    [name] omeprazole (PRILOSEC) 40 MG capsule Take 40 mg by mouth 2 (two) times  daily.    . ranitidine (ZANTAC) 300 MG tablet Take 300 mg by mouth at bedtime.    [name] spironolactone (ALDACTONE) 50 MG tablet Take 50 mg by mouth daily.     No current facility-administered medications on file prior to visit.     PAST MEDICAL HISTORY: Past Medical History:  Diagnosis Date  . Allergic rhinitis   . Anemia   . Anxiety   . Back pain   . Bradycardia   . Breast mass    Patient can no longer palpate specific masses but showed tech general area of concern  . CHF (congestive heart failure) ([place])   . CKD (chronic kidney disease)    STAGE 3  . Constipation   . Diabetes mellitus without complication ([place])   . Dyspnea   . GERD (gastroesophageal reflux disease)   . HLD (hyperlipidemia)   . Hyperparathyroidism ([place])   . Hypertension   . Joint pain   . Leg edema   . Legally blind in left eye, as defined in [place]   . Lymphedema   . Onychomycosis     PAST SURGICAL HISTORY: Past Surgical History:  Procedure Laterality Date  . ABDOMINAL HYSTERECTOMY    . BREAST BIOPSY Left 2014   FNA 12:00 position - Negative  . EYE SURGERY      SOCIAL HISTORY: Social History   Tobacco Use  . Smoking status: Never Smoker  . Smokeless tobacco: Never Used  Substance Use Topics  . Alcohol use: No  . Drug use: Never    FAMILY HISTORY: Family History  Problem Relation Age of Onset  . Breast cancer Sister 83  . Diabetes Sister   . Diabetes Mother   . Hypertension Mother   . Hyperlipidemia Mother   . Eating disorder Mother   . Obesity Mother     ROS: Review of Systems  Constitutional: Positive for weight loss.  Gastrointestinal: Negative for nausea and vomiting.  Musculoskeletal:       Negative muscle weakness    PHYSICAL EXAM: Blood pressure (!) 169/72, pulse 74, temperature 97.6 F (36.4 C), temperature source Oral, height 5' 6"  (1.676 m), weight 280 lb (127 kg), SpO2 100 %. Body mass index is 45.19 kg/m. Physical Exam  Constitutional: Rachel Holmes is oriented to person,  place, and time. Rachel Holmes appears well-developed and well-nourished.  Cardiovascular: Normal rate.  Pulmonary/Chest: Effort normal.  Musculoskeletal: Normal range of motion.  Neurological: Rachel Holmes is oriented to person, place, and time.  Skin: Skin is warm and dry.  Psychiatric: Rachel Holmes has a normal mood and affect. Her behavior is normal.  Vitals reviewed.   RECENT LABS AND TESTS: BMET    Component Value Date/Time   NA 139 06/25/2018 1050   NA 137 11/21/2013 0627   K 5.6 (H) 06/25/2018 1050   K  4.6 11/21/2013 0627   CL 103 06/25/2018 1050   CL 107 11/21/2013 0627   CO2 21 06/25/2018 1050   CO2 24 11/21/2013 0627   GLUCOSE 248 (H) 06/25/2018 1050   GLUCOSE 86 02/03/2017 1119   GLUCOSE 95 11/21/2013 0627   BUN 43 (H) 06/25/2018 1050   BUN 34 (H) 11/21/2013 0627   CREATININE 2.59 (H) 06/25/2018 1050   CREATININE 1.80 (H) 09/09/2014 1220   CALCIUM 9.0 06/25/2018 1050   CALCIUM 8.8 09/09/2014 1220   GFRNONAA 20 (L) 06/25/2018 1050   GFRNONAA 31 (L) 09/09/2014 1220   GFRNONAA 26 (L) 11/21/2013 0627   GFRAA 23 (L) 06/25/2018 1050   GFRAA 38 (L) 09/09/2014 1220   GFRAA 30 (L) 11/21/2013 0627   Lab Results  Component Value Date   HGBA1C 10.1 (A) 07/21/2018   HGBA1C 8.5 (A) 05/19/2018   HGBA1C 7.2 02/13/2018   HGBA1C 7.2 12/05/2017   HGBA1C 7.3 10/02/2017   No results found for: INSULIN CBC    Component Value Date/Time   WBC 6.1 06/25/2018 1050   WBC 7.8 02/03/2017 1119   RBC 3.47 (L) 06/25/2018 1050   RBC 4.26 02/03/2017 1119   HGB 9.7 (L) 06/25/2018 1050   HCT 30.3 (L) 06/25/2018 1050   PLT 230 02/03/2017 1119   PLT 210 09/09/2014 1220   MCV 87 06/25/2018 1050   MCV 87 09/09/2014 1220   MCH 28.0 06/25/2018 1050   MCH 28.2 02/03/2017 1119   MCHC 32.0 06/25/2018 1050   MCHC 32.4 02/03/2017 1119   RDW 14.0 06/25/2018 1050   RDW 15.1 (H) 09/09/2014 1220   LYMPHSABS 1.9 06/25/2018 1050   LYMPHSABS 2.0 09/09/2014 1220   MONOABS 0.4 02/03/2017 1119   MONOABS 0.5 09/09/2014  1220   EOSABS 0.2 06/25/2018 1050   EOSABS 0.4 09/09/2014 1220   BASOSABS 0.1 06/25/2018 1050   BASOSABS 0.1 09/09/2014 1220   Iron/TIBC/Ferritin/ %Sat    Component Value Date/Time   IRON 39 (L) 10/07/2014 1527   TIBC 202 (L) 10/07/2014 1527   FERRITIN 176 08/04/2014 1527   IRONPCTSAT 19 10/07/2014 1527   Lipid Panel     Component Value Date/Time   CHOL 191 06/25/2018 1050   CHOL 157 11/20/2013 0954   TRIG 104 06/25/2018 1050   TRIG 114 11/20/2013 0954   HDL 47 06/25/2018 1050   HDL 38 (L) 11/20/2013 0954   VLDL 23 11/20/2013 0954   LDLCALC 123 (H) 06/25/2018 1050   LDLCALC 96 11/20/2013 0954   Hepatic Function Panel     Component Value Date/Time   PROT 6.4 06/25/2018 1050   PROT 7.9 11/19/2013 0934   ALBUMIN 3.5 06/25/2018 1050   ALBUMIN 3.2 (L) 11/19/2013 0934   AST 13 06/25/2018 1050   AST 19 11/19/2013 0934   ALT 10 06/25/2018 1050   ALT 20 11/19/2013 0934   ALKPHOS 124 (H) 06/25/2018 1050   ALKPHOS 95 11/19/2013 0934   BILITOT 0.3 06/25/2018 1050   BILITOT 0.3 11/19/2013 0934      Component Value Date/Time   TSH 1.170 03/11/2018 1002   TSH 1.42 11/20/2013 0954  Results for REIGHLYN, ELMES (MRN [id number]) as of 09/23/2018 12:18  Ref. Range 06/25/2018 10:50  Vitamin D, 25-Hydroxy Latest Ref Range: 30.0 - 100.0 ng/mL 28.5 (L)    ASSESSMENT AND PLAN: Vitamin D deficiency - Plan: Vitamin D, Ergocalciferol, (DRISDOL) 1.25 MG (50000 UT) CAPS capsule  Type 2 diabetes mellitus without complication, with long-term current use of insulin (HCC)  Class 3 severe obesity with serious comorbidity and body mass index (BMI) of 45.0 to 49.9 in adult, unspecified obesity type ([place])  PLAN:  Vitamin D Deficiency Samarrah was informed that low vitamin D levels contributes to fatigue and are associated with obesity, breast, and colon cancer. Rachel Holmes agrees to continue taking prescription Vit D @50 ,000 IU every week #4 and we will refill for 1 month. Rachel Holmes will follow up for  routine testing of vitamin D, at least 2-3 times per year. Rachel Holmes was informed of the risk of over-replacement of vitamin D and agrees to not increase her dose unless Rachel Holmes discusses this with [name] first. Rachel Holmes agrees to follow up with our clinic in 3 to 4 weeks.  Diabetes II Rachel Holmes has been given extensive diabetes education by myself today including ideal fasting and post-prandial blood glucose readings, individual ideal Hgb A1c goals and hypoglycemia prevention. We discussed the importance of good blood sugar control to decrease the likelihood of diabetic complications such as nephropathy, neuropathy, limb loss, blindness, coronary artery disease, and death. We discussed the importance of intensive lifestyle modification including diet, exercise and weight loss as the first line treatment for diabetes. Rachel Holmes agrees to continue her diabetes medications, diet, and weight loss, and Rachel Holmes is to have A1c rechecked next month. Rachel Holmes agrees to follow up with our clinic in 3 to 4 weeks.  Obesity Rachel Holmes is currently in the action stage of change. As such, her goal is to continue with weight loss efforts Rachel Holmes has agreed to portion control better and make smarter food choices, such as increase vegetables and decrease simple carbohydrates  Rachel Holmes has been instructed to work up to a goal of 150 minutes of combined cardio and strengthening exercise per week for weight loss and overall health benefits. We discussed the following Behavioral Modification Strategies today: increasing lean protein intake, decreasing simple carbohydrates, work on meal planning and easy cooking plans, keeping healthy foods in the home, and holiday eating strategies    Rachel Holmes has agreed to follow up with our clinic in 3 to 4 weeks. Rachel Holmes was informed of the importance of frequent follow up visits to maximize her success with intensive lifestyle modifications for her multiple health conditions.   OBESITY BEHAVIORAL INTERVENTION VISIT  Today's visit  was # 10   Starting weight: 301 lbs Starting date: 03/11/18 Today's weight : 280 lbs Today's date: 09/21/2018 Total lbs lost to date: 21 At least 15 minutes were spent on discussing the following behavioral intervention visit.   ASK: We discussed the diagnosis of obesity with Rachel Holmes today and Rachel Holmes agreed to give [name] permission to discuss obesity behavioral modification therapy today.  ASSESS: Rachel Holmes has the diagnosis of obesity and her BMI today is 45.21 Rachel Holmes is in the action stage of change   ADVISE: Rachel Holmes was educated on the multiple health risks of obesity as well as the benefit of weight loss to improve her health. Rachel Holmes was advised of the need for long term treatment and the importance of lifestyle modifications to improve her current health and to decrease her risk of future health problems.  AGREE: Multiple dietary modification options and treatment options were discussed and  Rachel Holmes agreed to follow the recommendations documented in the above note.  ARRANGE: Rachel Holmes was educated on the importance of frequent visits to treat obesity as outlined per CMS and USPSTF guidelines and agreed to schedule her next follow up appointment today.  Wilhemena Durie, am acting as transcriptionist for Dennard Nip, MD  I have reviewed the above documentation for accuracy and completeness, and I agree with the above. -Dennard Nip, MD

## 2018-09-23 NOTE — Telephone Encounter (Signed)
Pt called stating she is experiencing swelling on left  foot and is supposed to stay off of her foot per Dr. Paulla Dolly. Pt is requesting an order be sent in for home care to help her. Also, Pt went for CT scan of ankle and is still waiting on results. Please give her a call

## 2018-09-23 NOTE — Telephone Encounter (Signed)
Required form, clinicals and demographics with orders for Skilled Nursing Diabetic teaching weekly and HHC aide 3 x week for assistance with ADL faxed to [facility]. Okayed by Dr. Paulla Dolly.

## 2018-09-23 NOTE — Telephone Encounter (Signed)
Patient said she needs a order put in for a personal aid, she is blind in one eye and cant get around with that foot. "I spoke with my Caseworker, yesterday and she said, all you need to do Is put in for a personal aide."

## 2018-09-23 NOTE — Telephone Encounter (Signed)
Results called to pt through Result Note message.

## 2018-09-24 ENCOUNTER — Telehealth: Payer: Self-pay | Admitting: Podiatry

## 2018-09-24 NOTE — Telephone Encounter (Signed)
Rhys Martini- [facility] home [facility] calling to inform Marcy Siren. They received our referral for pt, unfortunately pt has [facility] and that is out of network for them. [facility] is going to reach out to another company to see if they will except her insurance, will call back later today with update.

## 2018-09-25 ENCOUNTER — Ambulatory Visit (INDEPENDENT_AMBULATORY_CARE_PROVIDER_SITE_OTHER): Payer: Medicare HMO | Admitting: Endocrinology

## 2018-09-25 ENCOUNTER — Encounter: Payer: Self-pay | Admitting: Endocrinology

## 2018-09-25 VITALS — BP 120/64 | HR 74 | Ht 66.0 in | Wt 298.0 lb

## 2018-09-25 DIAGNOSIS — R809 Proteinuria, unspecified: Secondary | ICD-10-CM

## 2018-09-25 DIAGNOSIS — E1129 Type 2 diabetes mellitus with other diabetic kidney complication: Secondary | ICD-10-CM

## 2018-09-25 LAB — POCT GLYCOSYLATED HEMOGLOBIN (HGB A1C): Hemoglobin A1C: 8.5 % — AB (ref 4.0–5.6)

## 2018-09-25 MED ORDER — DAPAGLIFLOZIN PROPANEDIOL 5 MG PO TABS: 5.0000 mg | ORAL_TABLET | Freq: Every day | ORAL | 11 refills | Status: DC

## 2018-09-25 NOTE — Patient Instructions (Addendum)
check your blood sugar twice a day.  vary the time of day when you check, between before the 3 meals, and at bedtime.  also check if you have symptoms of your blood sugar being too high or too low.  please keep a record of the readings and bring it to your next appointment here (or you can bring the meter itself).  You can write it on any piece of paper.  please call us sooner if your blood sugar goes below 70, or if you have a lot of readings over 200. I have sent a prescription to your pharmacy, to add "[place]."   Please stop taking the furosemide. Please continue the same other diabetes medications. Please call if the leg swelling gets worse.   Please come back for a follow-up appointment in 2 months.

## 2018-09-25 NOTE — Progress Notes (Signed)
Subjective:    Patient ID: Rachel Holmes, female    DOB: 1961/05/05, 57 y.o.   MRN: [id number]  HPI Pt returns for f/u of diabetes mellitus: DM type: Insulin-requiring type 2 Dx'ed: 1988, during a pregnancy, but it persisted after Complications: polyneuropathy, renal failure, leg ulcer, Charcot foot, and PDR.   Therapy: insulin since soon after dx, and trulicity GDM: never DKA: never Severe hypoglycemia: 2 episodes (both many years ago).   Pancreatitis: never Pancreatic imaging: normal on 2008 [name].   Other: she takes multiple daily injections. Interval history: no cbg record, but states cbg's vary from 59-180.  It is lowest after breakfast.  She takes humalog, 10 units 3 times a day (just before each meal), and trulicity.   Past Medical History:  Diagnosis Date  . Allergic rhinitis   . Anemia   . Anxiety   . Back pain   . Bradycardia   . Breast mass    Patient can no longer palpate specific masses but showed tech general area of concern  . CHF (congestive heart failure) ([place])   . CKD (chronic kidney disease)    STAGE 3  . Constipation   . Diabetes mellitus without complication ([place])   . Dyspnea   . GERD (gastroesophageal reflux disease)   . HLD (hyperlipidemia)   . Hyperparathyroidism ([place])   . Hypertension   . Joint pain   . Leg edema   . Legally blind in left eye, as defined in [place]   . Lymphedema   . Onychomycosis     Past Surgical History:  Procedure Laterality Date  . ABDOMINAL HYSTERECTOMY    . BREAST BIOPSY Left 2014   FNA 12:00 position - Negative  . EYE SURGERY      Social History   Socioeconomic History  . Marital status: Divorced    Spouse name: Not on file  . Number of children: Not on file  . Years of education: Not on file  . Highest education level: Not on file  Occupational History  . Occupation: Glass blower/designer  Social Needs  . Financial resource strain: Not on file  . Food insecurity:    Worry: Not on file    Inability: Not on  file  . Transportation needs:    Medical: Not on file    Non-medical: Not on file  Tobacco Use  . Smoking status: Never Smoker  . Smokeless tobacco: Never Used  Substance and Sexual Activity  . Alcohol use: No  . Drug use: Never  . Sexual activity: Not on file  Lifestyle  . Physical activity:    Days per week: Not on file    Minutes per session: Not on file  . Stress: Not on file  Relationships  . Social connections:    Talks on phone: Not on file    Gets together: Not on file    Attends religious service: Not on file    Active member of club or organization: Not on file    Attends meetings of clubs or organizations: Not on file    Relationship status: Not on file  . Intimate partner violence:    Fear of current or ex partner: Not on file    Emotionally abused: Not on file    Physically abused: Not on file    Forced sexual activity: Not on file  Other Topics Concern  . Not on file  Social History Narrative   ** Merged History Encounter **  Current Outpatient Medications on File Prior to Visit  Medication Sig Dispense Refill  . albuterol (PROVENTIL HFA;VENTOLIN HFA) 108 (90 Base) MCG/ACT inhaler Inhale 1-2 puffs into the lungs every 6 (six) hours as needed for wheezing or shortness of breath.    [name] amLODipine (NORVASC) 10 MG tablet Take 10 mg by mouth daily.    [name] aspirin EC 325 MG tablet Take 325 mg by mouth daily.    . Blood Glucose Monitoring Suppl (ONE TOUCH ULTRA 2) w/Device KIT 1 Device by Does not apply route daily. 1 each 0  . cloNIDine (CATAPRES) 0.3 MG tablet Take 0.3 mg by mouth 2 (two) times daily.    . Continuous Blood Gluc Sensor (FREESTYLE LIBRE 14 DAY SENSOR) MISC 1 Device by Does not apply route every 14 (fourteen) days. 6 each 3  . Dulaglutide (TRULICITY) 1.5 TM/1.9QQ SOPN Inject 1.5 mg into the skin once a week. 4 pen 11  . DULoxetine (CYMBALTA) 30 MG capsule     . ferrous sulfate 325 (65 FE) MG tablet Take 325 mg by mouth 3 (three) times daily with  meals.    . gabapentin (NEURONTIN) 600 MG tablet Take 600 mg by mouth at bedtime.    [name] glucose blood (ONE TOUCH ULTRA TEST) test strip Used to check blood sugars four times daily. 150 each 12  . insulin aspart (NOVOLOG FLEXPEN) 100 UNIT/ML FlexPen Inject 5 Units into the skin 3 (three) times daily with meals. 5 pen 11  . isosorbide mononitrate (IMDUR) 120 MG 24 hr tablet Take 120 mg by mouth daily.    . Lancets (ONETOUCH ULTRASOFT) lancets Used to check blood sugars four times daily. 200 each 12  . Multiple Vitamin (MULTIVITAMIN WITH MINERALS) TABS tablet Take 1 tablet by mouth daily.    [name] omeprazole (PRILOSEC) 40 MG capsule Take 40 mg by mouth 2 (two) times daily.    . ranitidine (ZANTAC) 300 MG tablet Take 300 mg by mouth at bedtime.    [name] spironolactone (ALDACTONE) 50 MG tablet Take 50 mg by mouth daily.    . Vitamin D, Ergocalciferol, (DRISDOL) 1.25 MG (50000 UT) CAPS capsule Take 1 capsule (50,000 Units total) by mouth every 7 (seven) days. 4 capsule 0   No current facility-administered medications on file prior to visit.     Allergies  Allergen Reactions  . Statins Shortness Of Breath    Wheezing, short of breath  . Codeine Nausea Only  . Ibuprofen Other (See Comments)    Reaction:  Raises pts BP  . Penicillins Hives and Other (See Comments)    Has patient had a PCN reaction causing immediate rash, facial/tongue/throat swelling, SOB or lightheadedness with hypotension: No Has patient had a PCN reaction causing severe rash involving mucus membranes or skin necrosis: No Has patient had a PCN reaction that required hospitalization No Has patient had a PCN reaction occurring within the last 10 years: No If all of the above answers are "NO", then may proceed with Cephalosporin use.  [name] Percocet [Oxycodone-Acetaminophen] Nausea Only  . Tramadol Nausea Only  . Vicodin [Hydrocodone-Acetaminophen] Nausea Only    Family History  Problem Relation Age of Onset  . Breast cancer Sister 3    . Diabetes Sister   . Diabetes Mother   . Hypertension Mother   . Hyperlipidemia Mother   . Eating disorder Mother   . Obesity Mother     BP 120/64 (BP Location: Right Arm, Patient Position: Sitting, Cuff Size: Large)   Pulse 74  Ht 5' 6"  (1.676 m)   Wt 298 lb (135.2 kg)   SpO2 98%   BMI 48.10 kg/m    Review of Systems Denies LOC    Objective:   Physical Exam VITAL SIGNS:  See vs page GENERAL: no distress Pulses: right foot pulse is intact.   MSK: no deformity of the right foot CV: 1+ edema on the right leg Skin:  no ulcer on the right foot and ankle.  normal color and temp on the right foot and ankle.   Neuro: sensation is intact to touch on the right foot and ankle, but decreased from normal.  Ext: Left foot is in a boot.  There is onychomycosis of the right foot toenails.     Lab Results  Component Value Date   HGBA1C 8.5 (A) 09/25/2018   Lab Results  Component Value Date   CREATININE 2.59 (H) 06/25/2018   BUN 43 (H) 06/25/2018   NA 139 06/25/2018   K 5.6 (H) 06/25/2018   CL 103 06/25/2018   CO2 21 06/25/2018      Assessment & Plan:  Insulin-requiring type 2 DM, with renal failure: she needs increased rx.  Edema: with the addition of farxiga, she'll need to d/c lasix. Hypoglycemia: pt is advised to follow this, especially with the addition of another oral med   Patient Instructions  check your blood sugar twice a day.  vary the time of day when you check, between before the 3 meals, and at bedtime.  also check if you have symptoms of your blood sugar being too high or too low.  please keep a record of the readings and bring it to your next appointment here (or you can bring the meter itself).  You can write it on any piece of paper.  please call us sooner if your blood sugar goes below 70, or if you have a lot of readings over 200. I have sent a prescription to your pharmacy, to add "Wilder [place]ade."   Please stop taking the furosemide. Please continue the same  other diabetes medications. Please call if the leg swelling gets worse.   Please come back for a follow-up appointment in 2 months.

## 2018-10-06 ENCOUNTER — Telehealth: Payer: Self-pay | Admitting: Podiatry

## 2018-10-06 NOTE — Telephone Encounter (Signed)
Pt had CT scan at [facility], calling to follow up to see if we have received results.

## 2018-10-07 ENCOUNTER — Telehealth: Payer: Self-pay | Admitting: Podiatry

## 2018-10-07 ENCOUNTER — Other Ambulatory Visit (HOSPITAL_COMMUNITY): Payer: Self-pay

## 2018-10-07 NOTE — Telephone Encounter (Signed)
Referred to [facility] to have CT reviewed. [facility] states they have not received the referral from [name]. Requested we fax over the referral per patient.  Fax#  [phone]

## 2018-10-07 NOTE — Telephone Encounter (Signed)
I informed pt of Dr. Mellody Drown statement pt was referred to [facility]. I informed pt [facility] - Dr. Kathie Dike [phone].

## 2018-10-07 NOTE — Telephone Encounter (Signed)
I've not seen the results. She would have to forward to [name]. I'm assuming she has appointment at [facility]

## 2018-10-07 NOTE — Telephone Encounter (Signed)
refaxed copy of referral made 09/23/2018 with copy of CT and note emphasizing the referral of 09/23/2018.

## 2018-10-08 ENCOUNTER — Ambulatory Visit (HOSPITAL_COMMUNITY)
Admission: RE | Admit: 2018-10-08 | Discharge: 2018-10-08 | Disposition: A | Discharge status: Home / Self Care | Payer: Medicare HMO | Source: Ambulatory Visit | Attending: Nephrology | Admitting: Nephrology

## 2018-10-08 DIAGNOSIS — N189 Chronic kidney disease, unspecified: Secondary | ICD-10-CM | POA: Diagnosis not present

## 2018-10-08 DIAGNOSIS — D631 Anemia in chronic kidney disease: Secondary | ICD-10-CM | POA: Insufficient documentation

## 2018-10-08 MED ORDER — SODIUM CHLORIDE 0.9 % IV SOLN
510.0000 mg | INTRAVENOUS | Status: DC
  Administered 2018-10-08: 510 mg via INTRAVENOUS
  Filled 2018-10-08: qty 510

## 2018-10-08 NOTE — Telephone Encounter (Signed)
I reviewed Referral paperwork from 09/23/2018 and have 2 confirmed successful faxes to [phone]. I refaxed 3rd time to [phone].

## 2018-10-13 ENCOUNTER — Ambulatory Visit (INDEPENDENT_AMBULATORY_CARE_PROVIDER_SITE_OTHER): Payer: Medicare HMO | Admitting: Family Medicine

## 2018-10-13 ENCOUNTER — Encounter (INDEPENDENT_AMBULATORY_CARE_PROVIDER_SITE_OTHER): Payer: Self-pay

## 2018-10-13 ENCOUNTER — Telehealth: Payer: Self-pay | Admitting: Podiatry

## 2018-10-13 NOTE — Telephone Encounter (Signed)
Pt states she needs to know who is taking care of her wound care. Pt states she saw Dr. Kathie Dike yesterday and he is having her come back in Monday for a wound cast and he wrote her for a wheelchair. I told pt to get the name of the [facility] that has been coming out and give that to Dr. Izora Ribas office, we had referred to him and he would be taking care of the wound and orthopedics.

## 2018-10-13 NOTE — Telephone Encounter (Signed)
Pt called wanting to speak with Nurse regarding home health care. Pt is curious what they are supposed to be doing for wound care. Please give pt a call back.

## 2018-10-15 ENCOUNTER — Ambulatory Visit (HOSPITAL_COMMUNITY)
Admission: RE | Admit: 2018-10-15 | Discharge: 2018-10-15 | Disposition: A | Discharge status: Home / Self Care | Payer: Medicare HMO | Source: Ambulatory Visit | Attending: Nephrology | Admitting: Nephrology

## 2018-10-15 DIAGNOSIS — N189 Chronic kidney disease, unspecified: Secondary | ICD-10-CM | POA: Insufficient documentation

## 2018-10-15 DIAGNOSIS — D631 Anemia in chronic kidney disease: Secondary | ICD-10-CM | POA: Insufficient documentation

## 2018-10-15 MED ORDER — SODIUM CHLORIDE 0.9 % IV SOLN
510.0000 mg | INTRAVENOUS | Status: DC
  Administered 2018-10-15: 510 mg via INTRAVENOUS
  Filled 2018-10-15: qty 17

## 2018-11-04 NOTE — Progress Notes (Signed)
[facility] [address], [place] [address] Phone: [phone] Subjective:   Fontaine No, am serving as a scribe for Dr. Hulan Saas.     CC: Right knee pain  BJY:[id number]  [name] is a 58 y.o. female coming in with complaint of right knee pain. Patient did have injection last visit that helped alleviate her pain. Has pain with walking. Is using a walker and boot today.  Known severe arthritic changes in the knee.    Past Medical History:  Diagnosis Date  . Allergic rhinitis   . Anemia   . Anxiety   . Back pain   . Bradycardia   . Breast mass    Patient can no longer palpate specific masses but showed tech general area of concern  . CHF (congestive heart failure) ([place])   . CKD (chronic kidney disease)    STAGE 3  . Constipation   . Diabetes mellitus without complication ([place])   . Dyspnea   . GERD (gastroesophageal reflux disease)   . HLD (hyperlipidemia)   . Hyperparathyroidism ([place])   . Hypertension   . Joint pain   . Leg edema   . Legally blind in left eye, as defined in [place]   . Lymphedema   . Onychomycosis    Past Surgical History:  Procedure Laterality Date  . ABDOMINAL HYSTERECTOMY    . BREAST BIOPSY Left 2014   FNA 12:00 position - Negative  . EYE SURGERY     Social History   Socioeconomic History  . Marital status: Divorced    Spouse name: Not on file  . Number of children: Not on file  . Years of education: Not on file  . Highest education level: Not on file  Occupational History  . Occupation: Glass blower/designer  Social Needs  . Financial resource strain: Not on file  . Food insecurity:    Worry: Not on file    Inability: Not on file  . Transportation needs:    Medical: Not on file    Non-medical: Not on file  Tobacco Use  . Smoking status: Never Smoker  . Smokeless tobacco: Never Used  Substance and Sexual Activity  . Alcohol use: No  . Drug use: Never  . Sexual activity: Not on file   Lifestyle  . Physical activity:    Days per week: Not on file    Minutes per session: Not on file  . Stress: Not on file  Relationships  . Social connections:    Talks on phone: Not on file    Gets together: Not on file    Attends religious service: Not on file    Active member of club or organization: Not on file    Attends meetings of clubs or organizations: Not on file    Relationship status: Not on file  Other Topics Concern  . Not on file  Social History Narrative   ** Merged History Encounter **       Allergies  Allergen Reactions  . Statins Shortness Of Breath    Wheezing, short of breath  . Codeine Nausea Only  . Ibuprofen Other (See Comments)    Reaction:  Raises pts BP  . Penicillins Hives and Other (See Comments)    Has patient had a PCN reaction causing immediate rash, facial/tongue/throat swelling, SOB or lightheadedness with hypotension: No Has patient had a PCN reaction causing severe rash involving mucus membranes or skin necrosis: No Has patient  had a PCN reaction that required hospitalization No Has patient had a PCN reaction occurring within the last 10 years: No If all of the above answers are "NO", then may proceed with Cephalosporin use.  [name] Percocet [Oxycodone-Acetaminophen] Nausea Only  . Tramadol Nausea Only  . Vicodin [Hydrocodone-Acetaminophen] Nausea Only   Family History  Problem Relation Age of Onset  . Breast cancer Sister 57  . Diabetes Sister   . Diabetes Mother   . Hypertension Mother   . Hyperlipidemia Mother   . Eating disorder Mother   . Obesity Mother     Current Outpatient Medications (Endocrine & Metabolic):  .  dapagliflozin propanediol (FARXIGA) 5 MG TABS tablet, Take 5 mg by mouth daily. .  Dulaglutide (TRULICITY) 1.5 LD/3.5TS SOPN, Inject 1.5 mg into the skin once a week. .  insulin aspart (NOVOLOG FLEXPEN) 100 UNIT/ML FlexPen, Inject 5 Units into the skin 3 (three) times daily with meals.  Current Outpatient Medications  (Cardiovascular):  .  amLODipine (NORVASC) 10 MG tablet, Take 10 mg by mouth daily. .  cloNIDine (CATAPRES) 0.3 MG tablet, Take 0.3 mg by mouth 2 (two) times daily. .  isosorbide mononitrate (IMDUR) 120 MG 24 hr tablet, Take 120 mg by mouth daily. [name]  spironolactone (ALDACTONE) 50 MG tablet, Take 50 mg by mouth daily.  Current Outpatient Medications (Respiratory):  .  albuterol (PROVENTIL HFA;VENTOLIN HFA) 108 (90 Base) MCG/ACT inhaler, Inhale 1-2 puffs into the lungs every 6 (six) hours as needed for wheezing or shortness of breath.  Current Outpatient Medications (Analgesics):  .  aspirin EC 325 MG tablet, Take 325 mg by mouth daily.  Current Outpatient Medications (Hematological):  .  ferrous sulfate 325 (65 FE) MG tablet, Take 325 mg by mouth 3 (three) times daily with meals.  Current Outpatient Medications (Other):  .  Blood Glucose Monitoring Suppl (ONE TOUCH ULTRA 2) w/Device KIT, 1 Device by Does not apply route daily. .  Continuous Blood Gluc Sensor (FREESTYLE LIBRE 14 DAY SENSOR) MISC, 1 Device by Does not apply route every 14 (fourteen) days. .  DULoxetine (CYMBALTA) 30 MG capsule,  .  gabapentin (NEURONTIN) 600 MG tablet, Take 600 mg by mouth at bedtime. [name]  glucose blood (ONE TOUCH ULTRA TEST) test strip, Used to check blood sugars four times daily. .  Lancets (ONETOUCH ULTRASOFT) lancets, Used to check blood sugars four times daily. .  Multiple Vitamin (MULTIVITAMIN WITH MINERALS) TABS tablet, Take 1 tablet by mouth daily. [name]  omeprazole (PRILOSEC) 40 MG capsule, Take 40 mg by mouth 2 (two) times daily. .  ranitidine (ZANTAC) 300 MG tablet, Take 300 mg by mouth at bedtime. .  Vitamin D, Ergocalciferol, (DRISDOL) 1.25 MG (50000 UT) CAPS capsule, Take 1 capsule (50,000 Units total) by mouth every 7 (seven) days.    Past medical history, social, surgical and family history all reviewed in electronic medical record.  No pertanent information unless stated regarding to the chief  complaint.   Review of Systems:  No headache, visual changes, nausea, vomiting, diarrhea, constipation, dizziness, abdominal pain, skin rash, fevers, chills, night sweats, weight loss, swollen lymph nodes, b chest pain, shortness of breath, mood changes.  Other muscle aches, body aches, joint swelling  Objective  Blood pressure 134/70, pulse 82, height 5' 6"  (1.676 m), SpO2 96 %.    General: No apparent distress alert and oriented x3 mood and affect normal, dressed appropriately.  Antalgic gait walking with the aid of a walker Trace swelling of the lower extremity  on the right side.  Left side is in a cast at the moment.  With a cam walker on.  Knee: Right valgus deformity noted. Large thigh to calf ratio.  Tender to palpation over medial and PF joint line.  ROM full in flexion and extension and lower leg rotation. instability with valgus force.  painful patellar compression. Patellar glide with moderate crepitus. Patellar and quadriceps tendons unremarkable. Hamstring and quadriceps strength is normal. Contralateral knee shows arthritic changes but unable to do full evaluation with patient being once again in the cam walker  After informed written and verbal consent, patient was seated on exam table. Right knee was prepped with alcohol swab and utilizing anterolateral approach, patient's right knee space was injected with 4:1  marcaine 0.5%: Kenalog 67m/dL. Patient tolerated the procedure well without immediate complications.    Impression and Recommendations:     The above documentation has been reviewed and is accurate and complete ZLyndal Pulley DO       Note: This dictation was prepared with Dragon dictation along with smaller phrase technology. Any transcriptional errors that result from this process are unintentional.

## 2018-11-05 ENCOUNTER — Encounter: Payer: Self-pay | Admitting: Family Medicine

## 2018-11-05 ENCOUNTER — Ambulatory Visit: Payer: Medicare HMO | Admitting: Family Medicine

## 2018-11-05 DIAGNOSIS — M1711 Unilateral primary osteoarthritis, right knee: Secondary | ICD-10-CM | POA: Diagnosis not present

## 2018-11-05 NOTE — Assessment & Plan Note (Signed)
Repeat injection given today.  Tolerated the procedure well.  Discussed icing regimen and home exercise.  Discussed which activities to do which wants to avoid.  Increase activity slowly over the course the next several days.  Discussed with patient the other comorbidities are contributing to it.  Patient is likely not a surgical candidate until patient's wound heals on the contralateral side.  Patient will be difficult to have a replacement.  Has failed all conservative therapy and can repeat steroid injections every 10 weeks at this moment.

## 2018-11-05 NOTE — Patient Instructions (Addendum)
Good to see you  Rachel Holmes is your friend Stay active when you can  Injected the knee again  Can only do it every 10 weeks Otherwise surgery may be better Otherwise see me again in 10 weeks

## 2018-11-09 DIAGNOSIS — E08622 Diabetes mellitus due to underlying condition with other skin ulcer: Secondary | ICD-10-CM | POA: Insufficient documentation

## 2018-11-24 ENCOUNTER — Ambulatory Visit: Payer: Medicare HMO | Admitting: Family Medicine

## 2018-11-25 ENCOUNTER — Encounter: Payer: Self-pay | Admitting: Endocrinology

## 2018-11-25 ENCOUNTER — Ambulatory Visit (INDEPENDENT_AMBULATORY_CARE_PROVIDER_SITE_OTHER): Payer: Medicare HMO | Admitting: Endocrinology

## 2018-11-25 VITALS — BP 152/80 | HR 81 | Ht 66.0 in | Wt 305.2 lb

## 2018-11-25 DIAGNOSIS — E1129 Type 2 diabetes mellitus with other diabetic kidney complication: Secondary | ICD-10-CM

## 2018-11-25 DIAGNOSIS — R809 Proteinuria, unspecified: Secondary | ICD-10-CM

## 2018-11-25 LAB — POCT GLYCOSYLATED HEMOGLOBIN (HGB A1C): Hemoglobin A1C: 7.6 % — AB (ref 4.0–5.6)

## 2018-11-25 MED ORDER — GLUCOSE BLOOD VI STRP: 1.0000 | ORAL_STRIP | Freq: Two times a day (BID) | 5 refills | Status: DC

## 2018-11-25 MED ORDER — REPAGLINIDE 0.5 MG PO TABS: 0.5000 mg | ORAL_TABLET | Freq: Three times a day (TID) | ORAL | 11 refills | Status: DC

## 2018-11-25 NOTE — Progress Notes (Signed)
Subjective:    Patient ID: Rachel Holmes, female    DOB: Aug 24, 1961, 58 y.o.   MRN: [id number]  HPI Pt returns for f/u of diabetes mellitus: DM type: Insulin-requiring type 2 Dx'ed: 1988, during a pregnancy, but it persisted after Complications: polyneuropathy, renal failure, leg ulcer, Charcot foot, and PDR.   Therapy: insulin since soon after dx, farxiga, and trulicity DKA: never Severe hypoglycemia: 2 episodes (both many years ago).   Pancreatitis: never Pancreatic imaging: normal on 2008 [name].   Other: she takes multiple daily injections; she may be manageable off insulin.   Interval history: no cbg record, but states cbg's vary from 69-353.  It is lowest after breakfast.  She takes humalog, 10 units 3 times a day (just before each meal), and trulicity.   Past Medical History:  Diagnosis Date  . Allergic rhinitis   . Anemia   . Anxiety   . Back pain   . Bradycardia   . Breast mass    Patient can no longer palpate specific masses but showed tech general area of concern  . CHF (congestive heart failure) ([place])   . CKD (chronic kidney disease)    STAGE 3  . Constipation   . Diabetes mellitus without complication ([place])   . Dyspnea   . GERD (gastroesophageal reflux disease)   . HLD (hyperlipidemia)   . Hyperparathyroidism ([place])   . Hypertension   . Joint pain   . Leg edema   . Legally blind in left eye, as defined in [place]   . Lymphedema   . Onychomycosis     Past Surgical History:  Procedure Laterality Date  . ABDOMINAL HYSTERECTOMY    . BREAST BIOPSY Left 2014   FNA 12:00 position - Negative  . EYE SURGERY      Social History   Socioeconomic History  . Marital status: Divorced    Spouse name: Not on file  . Number of children: Not on file  . Years of education: Not on file  . Highest education level: Not on file  Occupational History  . Occupation: Glass blower/designer  Social Needs  . Financial resource strain: Not on file  . Food insecurity:    Worry: Not  on file    Inability: Not on file  . Transportation needs:    Medical: Not on file    Non-medical: Not on file  Tobacco Use  . Smoking status: Never Smoker  . Smokeless tobacco: Never Used  Substance and Sexual Activity  . Alcohol use: No  . Drug use: Never  . Sexual activity: Not on file  Lifestyle  . Physical activity:    Days per week: Not on file    Minutes per session: Not on file  . Stress: Not on file  Relationships  . Social connections:    Talks on phone: Not on file    Gets together: Not on file    Attends religious service: Not on file    Active member of club or organization: Not on file    Attends meetings of clubs or organizations: Not on file    Relationship status: Not on file  . Intimate partner violence:    Fear of current or ex partner: Not on file    Emotionally abused: Not on file    Physically abused: Not on file    Forced sexual activity: Not on file  Other Topics Concern  . Not on file  Social History Narrative   ** Merged History  Encounter **        Current Outpatient Medications on File Prior to Visit  Medication Sig Dispense Refill  . albuterol (PROVENTIL HFA;VENTOLIN HFA) 108 (90 Base) MCG/ACT inhaler Inhale 1-2 puffs into the lungs every 6 (six) hours as needed for wheezing or shortness of breath.    [name] amLODipine (NORVASC) 10 MG tablet Take 10 mg by mouth daily.    [name] aspirin EC 325 MG tablet Take 325 mg by mouth daily.    . Blood Glucose Monitoring Suppl (ONE TOUCH ULTRA 2) w/Device KIT 1 Device by Does not apply route daily. 1 each 0  . cloNIDine (CATAPRES) 0.3 MG tablet Take 0.3 mg by mouth 2 (two) times daily.    . dapagliflozin propanediol (FARXIGA) 5 MG TABS tablet Take 5 mg by mouth daily. 30 tablet 11  . Dulaglutide (TRULICITY) 1.5 PP/5.0DT SOPN Inject 1.5 mg into the skin once a week. 4 pen 11  . DULoxetine (CYMBALTA) 30 MG capsule     . ferrous sulfate 325 (65 FE) MG tablet Take 325 mg by mouth 3 (three) times daily with meals.      . gabapentin (NEURONTIN) 600 MG tablet Take 600 mg by mouth at bedtime.    . isosorbide mononitrate (IMDUR) 120 MG 24 hr tablet Take 120 mg by mouth daily.    . Lancets (ONETOUCH ULTRASOFT) lancets Used to check blood sugars four times daily. 200 each 12  . Multiple Vitamin (MULTIVITAMIN WITH MINERALS) TABS tablet Take 1 tablet by mouth daily.    [name] omeprazole (PRILOSEC) 40 MG capsule Take 40 mg by mouth 2 (two) times daily.    . ranitidine (ZANTAC) 300 MG tablet Take 300 mg by mouth at bedtime.    [name] spironolactone (ALDACTONE) 50 MG tablet Take 50 mg by mouth daily.    . Vitamin D, Ergocalciferol, (DRISDOL) 1.25 MG (50000 UT) CAPS capsule Take 1 capsule (50,000 Units total) by mouth every 7 (seven) days. 4 capsule 0   No current facility-administered medications on file prior to visit.     Allergies  Allergen Reactions  . Statins Shortness Of Breath    Wheezing, short of breath  . Codeine Nausea Only  . Ibuprofen Other (See Comments)    Reaction:  Raises pts BP  . Penicillins Hives and Other (See Comments)    Has patient had a PCN reaction causing immediate rash, facial/tongue/throat swelling, SOB or lightheadedness with hypotension: No Has patient had a PCN reaction causing severe rash involving mucus membranes or skin necrosis: No Has patient had a PCN reaction that required hospitalization No Has patient had a PCN reaction occurring within the last 10 years: No If all of the above answers are "NO", then may proceed with Cephalosporin use.  [name] Percocet [Oxycodone-Acetaminophen] Nausea Only  . Tramadol Nausea Only  . Vicodin [Hydrocodone-Acetaminophen] Nausea Only    Family History  Problem Relation Age of Onset  . Breast cancer Sister 25  . Diabetes Sister   . Diabetes Mother   . Hypertension Mother   . Hyperlipidemia Mother   . Eating disorder Mother   . Obesity Mother     BP (!) 152/80 (BP Location: Left Arm, Patient Position: Sitting, Cuff Size: Large)   Pulse 81   Ht  _0  (1.676 m)   Wt (!) 305 lb 3.2 oz (138.4 kg) Comment: wearing walking boot with bandages  SpO2 98%   BMI 49.26 kg/m    Review of Systems She denies LOC  Objective:   Physical Exam VITAL SIGNS:  See vs page GENERAL: no distress Pulses: right dorsalis pedis is intact  MSK: no deformity of the right foot CV: trace right leg edema Skin:  no ulcer on the feet.  normal color and temp on the right foot. Neuro: sensation is intact to touch on the right foot Ext: left foot is bandaged   Lab Results  Component Value Date   HGBA1C 7.6 (A) 11/25/2018   Lab Results  Component Value Date   CREATININE 2.59 (H) 06/25/2018   BUN 43 (H) 06/25/2018   NA 139 06/25/2018   K 5.6 (H) 06/25/2018   CL 103 06/25/2018   CO2 21 06/25/2018       Assessment & Plan:  Type 2 DM, with PDR: she can d/c insulin Renal failure: this limits rx options HTN: is noted today Noncompliance with cbg's  Patient Instructions  Your blood pressure is high today.  Please see your primary care provider soon, to have it rechecked. Here is a new meter.  I have sent a prescription to your pharmacy, for strips. I have sent a prescription to your pharmacy, to change the insulin to "repaglinide." Please continue the same other diabetes medications check your blood sugar twice a day.  vary the time of day when you check, between before the 3 meals, and at bedtime.  also check if you have symptoms of your blood sugar being too high or too low.  please keep a record of the readings and bring it to your next appointment here (or you can bring the meter itself).  You can write it on any piece of paper.  please call us sooner if your blood sugar goes below 70, or if you have a lot of readings over 200.  Please come back for a follow-up appointment in 2 months.

## 2018-11-25 NOTE — Patient Instructions (Addendum)
Your blood pressure is high today.  Please see your primary care provider soon, to have it rechecked. Here is a new meter.  I have sent a prescription to your pharmacy, for strips. I have sent a prescription to your pharmacy, to change the insulin to "repaglinide." Please continue the same other diabetes medications check your blood sugar twice a day.  vary the time of day when you check, between before the 3 meals, and at bedtime.  also check if you have symptoms of your blood sugar being too high or too low.  please keep a record of the readings and bring it to your next appointment here (or you can bring the meter itself).  You can write it on any piece of paper.  please call us sooner if your blood sugar goes below 70, or if you have a lot of readings over 200.  Please come back for a follow-up appointment in 2 months.

## 2018-11-30 ENCOUNTER — Telehealth: Payer: Self-pay | Admitting: Endocrinology

## 2018-11-30 MED ORDER — REPAGLINIDE 1 MG PO TABS: 1.0000 mg | ORAL_TABLET | Freq: Three times a day (TID) | ORAL | 11 refills | Status: DC

## 2018-11-30 NOTE — Telephone Encounter (Signed)
1. Are you taking any type of steroids? No  2. Are you sick or becoming sick? No  3. Is this the first elevated blood sugar reading? No  4. Have you tried to correct it with insulin? Yes, only once  5. Have you been taking/taken today all of the prescribed medications? Yes, taking Farxiga 1 tablet in the AM, Pioglitazone 1 tablet before each meal, no longer taking 5 units Novolog   Date Time Reading Notes       11/30/18 AM 229 NO INSULIN ADJUSTMENT  11/29/18 PM 258 NO INSULIN ADJUSTMENT  11/29/18 NOON 249 NO INSULIN ADJUSTMENT  11/29/18 AM 198 NO INSULIN ADJUSTMENT  UNABLE TO RECALL 11/28/18                      Please advise

## 2018-11-30 NOTE — Telephone Encounter (Signed)
OK, I have sent a prescription to your pharmacy, to double the repaglinide. I'll see you next time.

## 2018-11-30 NOTE — Telephone Encounter (Signed)
Called pt and made her aware. Verbalized acceptance and understanding. 

## 2018-11-30 NOTE — Telephone Encounter (Signed)
Patient stated she needing to give an update on her blood sugars,

## 2018-12-02 ENCOUNTER — Ambulatory Visit: Payer: Medicare HMO | Admitting: Family Medicine

## 2018-12-02 ENCOUNTER — Ambulatory Visit: Payer: Self-pay

## 2018-12-02 ENCOUNTER — Encounter: Payer: Self-pay | Admitting: Family Medicine

## 2018-12-02 ENCOUNTER — Ambulatory Visit: Payer: Medicare HMO | Admitting: Podiatry

## 2018-12-02 ENCOUNTER — Ambulatory Visit (INDEPENDENT_AMBULATORY_CARE_PROVIDER_SITE_OTHER)
Admission: RE | Admit: 2018-12-02 | Discharge: 2018-12-02 | Disposition: A | Discharge status: Home / Self Care | Payer: Medicare HMO | Source: Ambulatory Visit | Attending: Family Medicine | Admitting: Family Medicine

## 2018-12-02 VITALS — BP 132/80 | HR 83 | Ht 66.0 in | Wt 306.0 lb

## 2018-12-02 DIAGNOSIS — M25561 Pain in right knee: Secondary | ICD-10-CM

## 2018-12-02 DIAGNOSIS — S82001A Unspecified fracture of right patella, initial encounter for closed fracture: Secondary | ICD-10-CM | POA: Diagnosis not present

## 2018-12-02 DIAGNOSIS — M1711 Unilateral primary osteoarthritis, right knee: Secondary | ICD-10-CM | POA: Diagnosis not present

## 2018-12-02 NOTE — Assessment & Plan Note (Signed)
Worsening pain after fall.  Discussed with patient again about the possibility of viscosupplementation.  Had responded decently to [place] previously.  We will see if we get prior approval.  We discussed x-rays suggested an acute fall but patient did not have any significant changes this is a more pain over the patella itself that is likely a contusion.  Discussed over-the-counter medications.  Follow-up again in 4 to 6 weeks

## 2018-12-02 NOTE — Patient Instructions (Signed)
Good to see you  Xray downstairs Ice still every 4 hours for 20 minutesd Injected knee today only because we do not have many choices See me again in 4 weeks

## 2018-12-02 NOTE — Assessment & Plan Note (Signed)
History of fracture around 4 years ago.  X-ray is pending

## 2018-12-02 NOTE — Progress Notes (Signed)
[facility] [address], [place] [address] Phone: [phone] Subjective:   Rachel Holmes, am serving as a scribe for Dr. Hulan Saas.  CC: Right knee pain  QPR:[id number]  Rachel Holmes is a 58 y.o. female coming in with complaint of right knee pain. She fell 2 weeks ago on her knee. Was having an improvement in pain following her pain. Is now having pain from fall over patella. Pain with walking. Has tried icing a few times.      Past Medical History:  Diagnosis Date  . Allergic rhinitis   . Anemia   . Anxiety   . Back pain   . Bradycardia   . Breast mass    Patient can Holmes longer palpate specific masses but showed tech general area of concern  . CHF (congestive heart failure) ([place])   . CKD (chronic kidney disease)    STAGE 3  . Constipation   . Diabetes mellitus without complication ([place])   . Dyspnea   . GERD (gastroesophageal reflux disease)   . HLD (hyperlipidemia)   . Hyperparathyroidism ([place])   . Hypertension   . Joint pain   . Leg edema   . Legally blind in left eye, as defined in [place]   . Lymphedema   . Onychomycosis    Past Surgical History:  Procedure Laterality Date  . ABDOMINAL HYSTERECTOMY    . BREAST BIOPSY Left 2014   FNA 12:00 position - Negative  . EYE SURGERY     Social History   Socioeconomic History  . Marital status: Divorced    Spouse name: Not on file  . Number of children: Not on file  . Years of education: Not on file  . Highest education level: Not on file  Occupational History  . Occupation: Glass blower/designer  Social Needs  . Financial resource strain: Not on file  . Food insecurity:    Worry: Not on file    Inability: Not on file  . Transportation needs:    Medical: Not on file    Non-medical: Not on file  Tobacco Use  . Smoking status: Never Smoker  . Smokeless tobacco: Never Used  Substance and Sexual Activity  . Alcohol use: Holmes  . Drug use: Never  . Sexual activity: Not on  file  Lifestyle  . Physical activity:    Days per week: Not on file    Minutes per session: Not on file  . Stress: Not on file  Relationships  . Social connections:    Talks on phone: Not on file    Gets together: Not on file    Attends religious service: Not on file    Active member of club or organization: Not on file    Attends meetings of clubs or organizations: Not on file    Relationship status: Not on file  Other Topics Concern  . Not on file  Social History Narrative   ** Merged History Encounter **       Allergies  Allergen Reactions  . Statins Shortness Of Breath    Wheezing, short of breath  . Codeine Nausea Only  . Ibuprofen Other (See Comments)    Reaction:  Raises pts BP  . Penicillins Hives and Other (See Comments)    Has patient had a PCN reaction causing immediate rash, facial/tongue/throat swelling, SOB or lightheadedness with hypotension: Holmes Has patient had a PCN reaction causing severe rash involving mucus membranes or skin necrosis:  Holmes Has patient had a PCN reaction that required hospitalization Holmes Has patient had a PCN reaction occurring within the last 10 years: Holmes If all of the above answers are "Holmes", then may proceed with Cephalosporin use.  [name] Percocet [Oxycodone-Acetaminophen] Nausea Only  . Tramadol Nausea Only  . Vicodin [Hydrocodone-Acetaminophen] Nausea Only   Family History  Problem Relation Age of Onset  . Breast cancer Sister 47  . Diabetes Sister   . Diabetes Mother   . Hypertension Mother   . Hyperlipidemia Mother   . Eating disorder Mother   . Obesity Mother     Current Outpatient Medications (Endocrine & Metabolic):  .  dapagliflozin propanediol (FARXIGA) 5 MG TABS tablet, Take 5 mg by mouth daily. .  Dulaglutide (TRULICITY) 1.5 PF/7.9KW SOPN, Inject 1.5 mg into the skin once a week. .  repaglinide (PRANDIN) 1 MG tablet, Take 1 tablet (1 mg total) by mouth 3 (three) times daily before meals.  Current Outpatient Medications  (Cardiovascular):  .  amLODipine (NORVASC) 10 MG tablet, Take 10 mg by mouth daily. .  cloNIDine (CATAPRES) 0.3 MG tablet, Take 0.3 mg by mouth 2 (two) times daily. .  isosorbide mononitrate (IMDUR) 120 MG 24 hr tablet, Take 120 mg by mouth daily. [name]  spironolactone (ALDACTONE) 50 MG tablet, Take 50 mg by mouth daily.  Current Outpatient Medications (Respiratory):  .  albuterol (PROVENTIL HFA;VENTOLIN HFA) 108 (90 Base) MCG/ACT inhaler, Inhale 1-2 puffs into the lungs every 6 (six) hours as needed for wheezing or shortness of breath.  Current Outpatient Medications (Analgesics):  .  aspirin EC 325 MG tablet, Take 325 mg by mouth daily.  Current Outpatient Medications (Hematological):  .  ferrous sulfate 325 (65 FE) MG tablet, Take 325 mg by mouth 3 (three) times daily with meals.  Current Outpatient Medications (Other):  .  Blood Glucose Monitoring Suppl (ONE TOUCH ULTRA 2) w/Device KIT, 1 Device by Does not apply route daily. .  DULoxetine (CYMBALTA) 30 MG capsule,  .  gabapentin (NEURONTIN) 600 MG tablet, Take 600 mg by mouth at bedtime. [name]  glucose blood (ONETOUCH VERIO) test strip, 1 each by Other route 2 (two) times daily. And lancets 1/day .  Lancets (ONETOUCH ULTRASOFT) lancets, Used to check blood sugars four times daily. .  Multiple Vitamin (MULTIVITAMIN WITH MINERALS) TABS tablet, Take 1 tablet by mouth daily. [name]  omeprazole (PRILOSEC) 40 MG capsule, Take 40 mg by mouth 2 (two) times daily. .  ranitidine (ZANTAC) 300 MG tablet, Take 300 mg by mouth at bedtime. .  Vitamin D, Ergocalciferol, (DRISDOL) 1.25 MG (50000 UT) CAPS capsule, Take 1 capsule (50,000 Units total) by mouth every 7 (seven) days.    Past medical history, social, surgical and family history all reviewed in electronic medical record.  Holmes pertanent information unless stated regarding to the chief complaint.   Review of Systems:  Holmes headache, visual changes, nausea, vomiting, diarrhea, constipation, dizziness,  abdominal pain, skin rash, fevers, chills, night sweats, weight loss, swollen lymph nodes, chest pain, shortness of breath, mood changes.  Positive muscle aches, body aches, joint swelling  Objective  Blood pressure 132/80, pulse 83, height 5' 6"  (1.676 m), weight (!) 306 lb (138.8 kg), SpO2 96 %.    General: Holmes apparent distress alert and oriented x3 mood and affect normal, dressed appropriately.  HEENT: Pupils equal, extraocular movements intact  Respiratory: Patient's speak in full sentences and does not appear short of breath  Cardiovascular: Significant 3+ pitting edema of the  lower extremities bilaterally, non tender, Holmes erythema  Abdomen: Soft nontender  Neuro: Cranial nerves II through XII are intact, neurovascularly intact in all extremities with 2+ DTRs and 2+ pulses.  Lymph: Holmes lymphadenopathy of posterior or anterior cervical chain or axillae bilaterally.  Gait antalgic MSK:  tender with limited range of motion and good stability and symmetric strength and tone of shoulders, elbows, wrist, hip, and ankles bilaterally.  Knee: Right valgus deformity noted. Large thigh to calf ratio.  Tender to palpation over medial and PF joint line.  ROM full in flexion and extension and lower leg rotation. instability with valgus force.  painful patellar compression.  More pain than usual Patellar glide with moderate crepitus. Patellar and quadriceps tendons unremarkable. Hamstring and quadriceps strength is normal. Contralateral knee shows ptotic changes as well.  After informed written and verbal consent, patient was seated on exam table. Right knee was prepped with alcohol swab and utilizing anterolateral approach, patient's right knee space was injected with 4:1  marcaine 0.5%: Kenalog 56m/dL. Patient tolerated the procedure well without immediate complications.   Impression and Recommendations:     The above documentation has been reviewed and is accurate and complete Rachel Pulley  DO       Note: This dictation was prepared with Dragon dictation along with smaller phrase technology. Any transcriptional errors that result from this process are unintentional.

## 2018-12-05 DIAGNOSIS — A419 Sepsis, unspecified organism: Secondary | ICD-10-CM

## 2018-12-05 HISTORY — DX: Sepsis, unspecified organism: A41.9

## 2018-12-23 ENCOUNTER — Emergency Department (HOSPITAL_COMMUNITY)
Admission: EM | Admit: 2018-12-23 | Discharge: 2018-12-23 | Disposition: A | Discharge status: Home / Self Care | Payer: Medicare HMO | Source: Home / Self Care | Attending: Emergency Medicine | Admitting: Emergency Medicine

## 2018-12-23 ENCOUNTER — Other Ambulatory Visit: Payer: Self-pay

## 2018-12-23 ENCOUNTER — Encounter (HOSPITAL_COMMUNITY): Payer: Self-pay | Admitting: Emergency Medicine

## 2018-12-23 ENCOUNTER — Emergency Department (HOSPITAL_COMMUNITY): Payer: Medicare HMO

## 2018-12-23 DIAGNOSIS — Z79899 Other long term (current) drug therapy: Secondary | ICD-10-CM

## 2018-12-23 DIAGNOSIS — J111 Influenza due to unidentified influenza virus with other respiratory manifestations: Secondary | ICD-10-CM | POA: Insufficient documentation

## 2018-12-23 DIAGNOSIS — E1122 Type 2 diabetes mellitus with diabetic chronic kidney disease: Secondary | ICD-10-CM | POA: Insufficient documentation

## 2018-12-23 DIAGNOSIS — I130 Hypertensive heart and chronic kidney disease with heart failure and stage 1 through stage 4 chronic kidney disease, or unspecified chronic kidney disease: Secondary | ICD-10-CM | POA: Insufficient documentation

## 2018-12-23 DIAGNOSIS — R7881 Bacteremia: Secondary | ICD-10-CM | POA: Diagnosis not present

## 2018-12-23 DIAGNOSIS — N184 Chronic kidney disease, stage 4 (severe): Secondary | ICD-10-CM | POA: Insufficient documentation

## 2018-12-23 DIAGNOSIS — A408 Other streptococcal sepsis: Secondary | ICD-10-CM | POA: Diagnosis not present

## 2018-12-23 DIAGNOSIS — Z7982 Long term (current) use of aspirin: Secondary | ICD-10-CM

## 2018-12-23 DIAGNOSIS — I509 Heart failure, unspecified: Secondary | ICD-10-CM

## 2018-12-23 DIAGNOSIS — R69 Illness, unspecified: Principal | ICD-10-CM

## 2018-12-23 LAB — COMPREHENSIVE METABOLIC PANEL
ALT: 39 U/L (ref 0–44)
AST: 69 U/L — ABNORMAL HIGH (ref 15–41)
Albumin: 3.9 g/dL (ref 3.5–5.0)
Alkaline Phosphatase: 116 U/L (ref 38–126)
Anion gap: 11 (ref 5–15)
BUN: 36 mg/dL — ABNORMAL HIGH (ref 6–20)
CO2: 21 mmol/L — ABNORMAL LOW (ref 22–32)
Calcium: 8.9 mg/dL (ref 8.9–10.3)
Chloride: 106 mmol/L (ref 98–111)
Creatinine, Ser: 2.75 mg/dL — ABNORMAL HIGH (ref 0.44–1.00)
GFR calc Af Amer: 21 mL/min — ABNORMAL LOW (ref >60)
GFR calc non Af Amer: 18 mL/min — ABNORMAL LOW (ref >60)
Glucose, Bld: 159 mg/dL — ABNORMAL HIGH (ref 70–99)
Potassium: 5 mmol/L (ref 3.5–5.1)
Sodium: 138 mmol/L (ref 135–145)
Total Bilirubin: 0.8 mg/dL (ref 0.3–1.2)
Total Protein: 8 g/dL (ref 6.5–8.1)

## 2018-12-23 LAB — CBC WITH DIFFERENTIAL/PLATELET
Abs Immature Granulocytes: 0.05 10*3/uL (ref 0.00–0.07)
Basophils Absolute: 0 10*3/uL (ref 0.0–0.1)
Basophils Relative: 0 %
Eosinophils Absolute: 0.1 10*3/uL (ref 0.0–0.5)
Eosinophils Relative: 1 %
HCT: 35.8 % — ABNORMAL LOW (ref 36.0–46.0)
Hemoglobin: 11.1 g/dL — ABNORMAL LOW (ref 12.0–15.0)
Immature Granulocytes: 1 %
Lymphocytes Relative: 7 %
Lymphs Abs: 0.8 10*3/uL (ref 0.7–4.0)
MCH: 29.1 pg (ref 26.0–34.0)
MCHC: 31 g/dL (ref 30.0–36.0)
MCV: 93.7 fL (ref 80.0–100.0)
Monocytes Absolute: 0.2 10*3/uL (ref 0.1–1.0)
Monocytes Relative: 2 %
Neutro Abs: 9.7 10*3/uL — ABNORMAL HIGH (ref 1.7–7.7)
Neutrophils Relative %: 89 %
Platelets: 223 10*3/uL (ref 150–400)
RBC: 3.82 MIL/uL — ABNORMAL LOW (ref 3.87–5.11)
RDW: 13.1 % (ref 11.5–15.5)
WBC: 10.8 10*3/uL — ABNORMAL HIGH (ref 4.0–10.5)
nRBC: 0 % (ref 0.0–0.2)

## 2018-12-23 LAB — URINALYSIS, ROUTINE W REFLEX MICROSCOPIC
Bilirubin Urine: NEGATIVE
Glucose, UA: NEGATIVE mg/dL
Hgb urine dipstick: NEGATIVE
Ketones, ur: NEGATIVE mg/dL
Nitrite: NEGATIVE
Protein, ur: 100 mg/dL — AB
Specific Gravity, Urine: 1.012 (ref 1.005–1.030)
pH: 6 (ref 5.0–8.0)

## 2018-12-23 LAB — INFLUENZA PANEL BY PCR (TYPE A & B)
Influenza A By PCR: NEGATIVE
Influenza B By PCR: NEGATIVE

## 2018-12-23 LAB — I-STAT BETA HCG BLOOD, ED (MC, WL, AP ONLY): I-stat hCG, quantitative: 5 m[IU]/mL — ABNORMAL HIGH (ref <5)

## 2018-12-23 LAB — GROUP A STREP BY PCR: Group A Strep by PCR: NOT DETECTED

## 2018-12-23 LAB — LACTIC ACID, PLASMA: Lactic Acid, Venous: 2.1 mmol/L (ref 0.5–1.9)

## 2018-12-23 MED ORDER — SODIUM CHLORIDE 0.9% FLUSH: 3.0000 mL | Freq: Once | INTRAVENOUS | Status: DC

## 2018-12-23 MED ORDER — ACETAMINOPHEN 325 MG PO TABS
650.0000 mg | ORAL_TABLET | Freq: Once | ORAL | Status: AC
  Administered 2018-12-23: 650 mg via ORAL
  Filled 2018-12-23: qty 2

## 2018-12-23 NOTE — ED Notes (Signed)
Patient verbalizes understanding of discharge instructions. Opportunity for questioning and answers were provided. Armband removed by staff, pt discharged from ED. Wheeled out to lobby  

## 2018-12-23 NOTE — ED Triage Notes (Signed)
Pt with fever, body aches and chills since yesterday. She reports that she sees the wound clinic for her left foot. Pt a/o at triage. NAD. Labs with set of cultures completed in triage.

## 2018-12-23 NOTE — ED Notes (Signed)
Pt states "I can have tylenol just not tramadol."

## 2018-12-23 NOTE — ED Provider Notes (Signed)
[facility] EMERGENCY DEPARTMENT Provider Note   CSN: [id number] Arrival date & time: 12/23/18  1941    History   Chief Complaint Chief Complaint  Patient presents with  . Fever  . Generalized Body Aches    HPI Rachel Holmes is a 58 y.o. female.     Patient states that has had fever, chills and body aches since yesterday. Tmax at home 102F. She has no SOB, congestion, rhinorrhea. She has had sick contacts. She does endorse some back pain but no chest pain or palpitations. She was at wound care yesterday for her chronic L ankle ulcer which she was told that was healing well. She has noticed that her urine is darker and stronger smell but no dysuria or urgency/frequency. Got seasonal influenza vaccination this year.      Past Medical History:  Diagnosis Date  . Allergic rhinitis   . Anemia   . Anxiety   . Back pain   . Bradycardia   . Breast mass    Patient can no longer palpate specific masses but showed tech general area of concern  . CHF (congestive heart failure) ([place])   . CKD (chronic kidney disease)    STAGE 3  . Constipation   . Diabetes mellitus without complication ([place])   . Dyspnea   . GERD (gastroesophageal reflux disease)   . HLD (hyperlipidemia)   . Hyperparathyroidism ([place])   . Hypertension   . Joint pain   . Leg edema   . Legally blind in left eye, as defined in [place]   . Lymphedema   . Onychomycosis     Patient Active Problem List   Diagnosis Date Noted  . Malignant hypertension (arteriolar nephrosclerosis), stage 1-4 or unspecified chronic kidney disease 07/15/2018  . CKD (chronic kidney disease) stage 4, GFR 15-29 ml/min (HCC) 07/15/2018  . Morbid obesity with BMI of 45.0-49.9, adult ([place]) 07/15/2018  . Excessive daytime sleepiness 07/15/2018  . Other fatigue 03/11/2018  . Shortness of breath on exertion 03/11/2018  . Essential hypertension 03/11/2018  . Vitamin D deficiency 03/11/2018  . Congestive heart failure ([place])  03/11/2018  . Degenerative arthritis of right knee 01/06/2018  . Diabetes ([place]) 10/03/2017  . Knee pain 10/02/2017  . Obesity 10/02/2017  . Charcot ankle, left 08/29/2017  . Lymphedema 09/06/2016  . Hyponatremia with extracellular fluid depletion 02/02/2016  . Nausea with vomiting, unspecified 02/02/2016  . Closed nondisplaced fracture of right patella 08/23/2015  . Acute cystitis without hematuria 08/14/2014  . Colitis 08/14/2014  . HTN (hypertension), malignant 08/14/2014  . Acute renal failure superimposed on stage 3 chronic kidney disease ([place]) 06/26/2014  . Hyperparathyroidism, unspecified ([place]) 02/24/2014  . Allergic rhinitis 02/22/2014  . Onychomycosis 02/22/2014  . Anxiety 11/05/2013  . Bradycardia 05/14/2013  . Gastro-esophageal reflux disease without esophagitis 04/05/2013  . Anemia in other chronic diseases classified elsewhere 01/27/2013    Past Surgical History:  Procedure Laterality Date  . ABDOMINAL HYSTERECTOMY    . BREAST BIOPSY Left 2014   FNA 12:00 position - Negative  . EYE SURGERY       OB History    Gravida  2   Para      Term      Preterm      AB      Living  2     SAB      TAB      Ectopic      Multiple      Live Births  Home Medications    Prior to Admission medications   Medication Sig Start Date End Date Taking? Authorizing Provider  albuterol (PROVENTIL HFA;VENTOLIN HFA) 108 (90 Base) MCG/ACT inhaler Inhale 1-2 puffs into the lungs every 6 (six) hours as needed for wheezing or shortness of breath.    [provider]  amLODipine (NORVASC) 10 MG tablet Take 10 mg by mouth daily.    [provider]  aspirin EC 325 MG tablet Take 325 mg by mouth daily.    [provider]  Blood Glucose Monitoring Suppl (ONE TOUCH ULTRA 2) w/Device KIT 1 Device by Does not apply route daily. 03/26/18   Renato Shin, MD  cloNIDine (CATAPRES) 0.3 MG tablet Take 0.3 mg by mouth 2 (two) times daily.     [provider]  dapagliflozin propanediol (FARXIGA) 5 MG TABS tablet Take 5 mg by mouth daily. 09/25/18   Renato Shin, MD  Dulaglutide (TRULICITY) 1.5 TD/3.2KG SOPN Inject 1.5 mg into the skin once a week. 08/21/18   Renato Shin, MD  DULoxetine (CYMBALTA) 30 MG capsule  08/28/18   [provider]  ferrous sulfate 325 (65 FE) MG tablet Take 325 mg by mouth 3 (three) times daily with meals.    [provider]  gabapentin (NEURONTIN) 600 MG tablet Take 600 mg by mouth at bedtime.    [provider]  glucose blood (ONETOUCH VERIO) test strip 1 each by Other route 2 (two) times daily. And lancets 1/day 11/25/18   Renato Shin, MD  isosorbide mononitrate (IMDUR) 120 MG 24 hr tablet Take 120 mg by mouth daily.    [provider]  Lancets [facility] ULTRASOFT) lancets Used to check blood sugars four times daily. 03/26/18   Renato Shin, MD  Multiple Vitamin (MULTIVITAMIN WITH MINERALS) TABS tablet Take 1 tablet by mouth daily.    [provider]  omeprazole (PRILOSEC) 40 MG capsule Take 40 mg by mouth 2 (two) times daily.    [provider]  ranitidine (ZANTAC) 300 MG tablet Take 300 mg by mouth at bedtime.    [provider]  repaglinide (PRANDIN) 1 MG tablet Take 1 tablet (1 mg total) by mouth 3 (three) times daily before meals. 11/30/18   Renato Shin, MD  spironolactone (ALDACTONE) 50 MG tablet Take 50 mg by mouth daily.    [provider]  Vitamin D, Ergocalciferol, (DRISDOL) 1.25 MG (50000 UT) CAPS capsule Take 1 capsule (50,000 Units total) by mouth every 7 (seven) days. 09/21/18   Starlyn Skeans, MD    Family History Family History  Problem Relation Age of Onset  . Breast cancer Sister 45  . Diabetes Sister   . Diabetes Mother   . Hypertension Mother   . Hyperlipidemia Mother   . Eating disorder Mother   . Obesity Mother     Social History Social History   Tobacco Use  . Smoking status: Never  Smoker  . Smokeless tobacco: Never Used  Substance Use Topics  . Alcohol use: No  . Drug use: Never     Allergies   Statins; Codeine; Ibuprofen; Penicillins; Percocet [oxycodone-acetaminophen]; Tramadol; and Vicodin [hydrocodone-acetaminophen]   Review of Systems Review of Systems  Constitutional: Positive for chills and fever.  HENT: Negative for congestion, rhinorrhea and sore throat.   Respiratory: Negative for shortness of breath.   Cardiovascular: Negative for chest pain.  Gastrointestinal: Negative for abdominal pain, diarrhea, nausea and vomiting.  Genitourinary: Negative for dysuria, frequency, hematuria and urgency.  Musculoskeletal: Positive  for arthralgias.  Skin: Positive for wound (chronic ). Negative for rash.     Physical Exam Updated Vital Signs BP (!) 152/61   Pulse 95   Temp (!) 102.1 F (38.9 C) (Oral)   Resp 18   SpO2 100%   Physical Exam Constitutional:      General: She is not in acute distress.    Appearance: Normal appearance. She is not toxic-appearing or diaphoretic.  HENT:     Head: Normocephalic and atraumatic.     Right Ear: Tympanic membrane, ear canal and external ear normal.     Left Ear: Tympanic membrane, ear canal and external ear normal.     Nose: Nose normal. No congestion or rhinorrhea.     Mouth/Throat:     Mouth: Mucous membranes are moist.     Pharynx: Oropharynx is clear. No oropharyngeal exudate or posterior oropharyngeal erythema.  Eyes:     Conjunctiva/sclera: Conjunctivae normal.  Neck:     Musculoskeletal: Normal range of motion and neck supple. No neck rigidity.  Cardiovascular:     Rate and Rhythm: Normal rate and regular rhythm.     Heart sounds: No murmur.  Pulmonary:     Effort: Pulmonary effort is normal.     Breath sounds: Normal breath sounds. No wheezing or rhonchi.  Abdominal:     General: Abdomen is flat. Bowel sounds are normal.     Tenderness: There is no abdominal tenderness. There is no guarding or  rebound.  Musculoskeletal:     Comments: L foot with cast in place. No erythema above cast. 1+ pitting edema bilaterally  Lymphadenopathy:     Cervical: No cervical adenopathy.  Skin:    General: Skin is warm and dry.  Neurological:     General: No focal deficit present.     Mental Status: She is alert and oriented to person, place, and time.       ED Treatments / Results  Labs (all labs ordered are listed, but only abnormal results are displayed) Labs Reviewed  LACTIC ACID, PLASMA - Abnormal; Notable for the following components:      Result Value   Lactic Acid, Venous 2.1 (*)    All other components within normal limits  COMPREHENSIVE METABOLIC PANEL - Abnormal; Notable for the following components:   CO2 21 (*)    Glucose, Bld 159 (*)    BUN 36 (*)    Creatinine, Ser 2.75 (*)    AST 69 (*)    GFR calc non Af Amer 18 (*)    GFR calc Af Amer 21 (*)    All other components within normal limits  CBC WITH DIFFERENTIAL/PLATELET - Abnormal; Notable for the following components:   WBC 10.8 (*)    RBC 3.82 (*)    Hemoglobin 11.1 (*)    HCT 35.8 (*)    Neutro Abs 9.7 (*)    All other components within normal limits  URINALYSIS, ROUTINE W REFLEX MICROSCOPIC - Abnormal; Notable for the following components:   Protein, ur 100 (*)    Leukocytes,Ua TRACE (*)    Bacteria, UA RARE (*)    All other components within normal limits  I-STAT BETA HCG BLOOD, ED (MC, WL, AP ONLY) - Abnormal; Notable for the following components:   I-stat hCG, quantitative 5.0 (*)    All other components within normal limits  GROUP A STREP BY PCR  CULTURE, BLOOD (SINGLE)  LACTIC ACID, PLASMA  INFLUENZA PANEL BY PCR (TYPE A & B)  EKG None  Radiology Dg Chest 2 View  Result Date: 12/23/2018 CLINICAL DATA:  Fever and chills EXAM: CHEST - 2 VIEW COMPARISON:  February 03, 2017 FINDINGS: The lungs are clear. The heart size and pulmonary vascularity are normal. No adenopathy. There is degenerative change  in the lower thoracic spine. IMPRESSION: No edema or consolidation. Electronically Signed   By: Lowella Grip III M.D.   On: 12/23/2018 10:14    Procedures Procedures (including critical care time)  Medications Ordered in ED Medications  sodium chloride flush (NS) 0.9 % injection 3 mL (has no administration in time range)  acetaminophen (TYLENOL) tablet 650 mg (650 mg Oral Given 12/23/18 0935)     Initial Impression / Assessment and Plan / ED Course  I have reviewed the triage vital signs and the nursing notes.  Pertinent labs & imaging results that were available during my care of the patient were reviewed by me and considered in my medical decision making (see chart for details).     Patient here with fever, chills and body aches for the past 2 days most c/w flu-like illness. Flu swab is still pending for confirmation. CXR clear. Able to take po well. UA without nitrites or bacteria and patient does not have dysuria. Offered tamiflu but patient declined so will not await flu result. Recommended supportive care at home with rest, good po hydration, OTC tylenol prn. Given return precautions.  Final Clinical Impressions(s) / ED Diagnoses   Final diagnoses:  Influenza-like illness    ED Discharge Orders    None       Bufford Lope, DO 12/23/18 [place], DO 12/23/18 1224

## 2018-12-23 NOTE — Discharge Instructions (Signed)
Get plenty of rest, lots of warm liquids

## 2018-12-23 NOTE — ED Notes (Signed)
Patient transported to X-ray 

## 2018-12-24 ENCOUNTER — Telehealth (HOSPITAL_BASED_OUTPATIENT_CLINIC_OR_DEPARTMENT_OTHER): Payer: Self-pay | Admitting: Emergency Medicine

## 2018-12-24 ENCOUNTER — Inpatient Hospital Stay (HOSPITAL_COMMUNITY)
Admission: EM | Admit: 2018-12-24 | Discharge: 2018-12-31 | DRG: 871 | Disposition: A | Discharge status: Home Health | Payer: Medicare HMO | Attending: Internal Medicine | Admitting: Internal Medicine

## 2018-12-24 ENCOUNTER — Inpatient Hospital Stay (HOSPITAL_COMMUNITY): Payer: Medicare HMO

## 2018-12-24 ENCOUNTER — Encounter (HOSPITAL_COMMUNITY): Payer: Self-pay | Admitting: Emergency Medicine

## 2018-12-24 DIAGNOSIS — E11621 Type 2 diabetes mellitus with foot ulcer: Secondary | ICD-10-CM | POA: Diagnosis present

## 2018-12-24 DIAGNOSIS — E1122 Type 2 diabetes mellitus with diabetic chronic kidney disease: Secondary | ICD-10-CM | POA: Diagnosis present

## 2018-12-24 DIAGNOSIS — E785 Hyperlipidemia, unspecified: Secondary | ICD-10-CM | POA: Diagnosis present

## 2018-12-24 DIAGNOSIS — A408 Other streptococcal sepsis: Principal | ICD-10-CM | POA: Diagnosis present

## 2018-12-24 DIAGNOSIS — J9601 Acute respiratory failure with hypoxia: Secondary | ICD-10-CM | POA: Diagnosis not present

## 2018-12-24 DIAGNOSIS — R6520 Severe sepsis without septic shock: Secondary | ICD-10-CM | POA: Diagnosis not present

## 2018-12-24 DIAGNOSIS — IMO0002 Reserved for concepts with insufficient information to code with codable children: Secondary | ICD-10-CM

## 2018-12-24 DIAGNOSIS — Z888 Allergy status to other drugs, medicaments and biological substances status: Secondary | ICD-10-CM

## 2018-12-24 DIAGNOSIS — F419 Anxiety disorder, unspecified: Secondary | ICD-10-CM | POA: Diagnosis present

## 2018-12-24 DIAGNOSIS — L03116 Cellulitis of left lower limb: Secondary | ICD-10-CM

## 2018-12-24 DIAGNOSIS — Z6841 Body Mass Index (BMI) 40.0 and over, adult: Secondary | ICD-10-CM

## 2018-12-24 DIAGNOSIS — I130 Hypertensive heart and chronic kidney disease with heart failure and stage 1 through stage 4 chronic kidney disease, or unspecified chronic kidney disease: Secondary | ICD-10-CM | POA: Diagnosis present

## 2018-12-24 DIAGNOSIS — N183 Chronic kidney disease, stage 3 (moderate): Secondary | ICD-10-CM | POA: Diagnosis not present

## 2018-12-24 DIAGNOSIS — I5033 Acute on chronic diastolic (congestive) heart failure: Secondary | ICD-10-CM | POA: Diagnosis present

## 2018-12-24 DIAGNOSIS — L97329 Non-pressure chronic ulcer of left ankle with unspecified severity: Secondary | ICD-10-CM | POA: Diagnosis not present

## 2018-12-24 DIAGNOSIS — E11628 Type 2 diabetes mellitus with other skin complications: Secondary | ICD-10-CM | POA: Diagnosis not present

## 2018-12-24 DIAGNOSIS — E11622 Type 2 diabetes mellitus with other skin ulcer: Secondary | ICD-10-CM | POA: Diagnosis not present

## 2018-12-24 DIAGNOSIS — I339 Acute and subacute endocarditis, unspecified: Secondary | ICD-10-CM | POA: Diagnosis not present

## 2018-12-24 DIAGNOSIS — B955 Unspecified streptococcus as the cause of diseases classified elsewhere: Secondary | ICD-10-CM | POA: Diagnosis not present

## 2018-12-24 DIAGNOSIS — Z833 Family history of diabetes mellitus: Secondary | ICD-10-CM

## 2018-12-24 DIAGNOSIS — E11610 Type 2 diabetes mellitus with diabetic neuropathic arthropathy: Secondary | ICD-10-CM | POA: Diagnosis present

## 2018-12-24 DIAGNOSIS — H548 Legal blindness, as defined in USA: Secondary | ICD-10-CM | POA: Diagnosis present

## 2018-12-24 DIAGNOSIS — E1142 Type 2 diabetes mellitus with diabetic polyneuropathy: Secondary | ICD-10-CM | POA: Diagnosis present

## 2018-12-24 DIAGNOSIS — I350 Nonrheumatic aortic (valve) stenosis: Secondary | ICD-10-CM | POA: Diagnosis not present

## 2018-12-24 DIAGNOSIS — L97229 Non-pressure chronic ulcer of left calf with unspecified severity: Secondary | ICD-10-CM | POA: Diagnosis present

## 2018-12-24 DIAGNOSIS — I509 Heart failure, unspecified: Secondary | ICD-10-CM | POA: Diagnosis not present

## 2018-12-24 DIAGNOSIS — M722 Plantar fascial fibromatosis: Secondary | ICD-10-CM | POA: Diagnosis present

## 2018-12-24 DIAGNOSIS — J690 Pneumonitis due to inhalation of food and vomit: Secondary | ICD-10-CM | POA: Diagnosis not present

## 2018-12-24 DIAGNOSIS — M14672 Charcot's joint, left ankle and foot: Secondary | ICD-10-CM | POA: Diagnosis not present

## 2018-12-24 DIAGNOSIS — E1169 Type 2 diabetes mellitus with other specified complication: Secondary | ICD-10-CM | POA: Diagnosis present

## 2018-12-24 DIAGNOSIS — N179 Acute kidney failure, unspecified: Secondary | ICD-10-CM | POA: Diagnosis present

## 2018-12-24 DIAGNOSIS — E213 Hyperparathyroidism, unspecified: Secondary | ICD-10-CM | POA: Diagnosis present

## 2018-12-24 DIAGNOSIS — E1165 Type 2 diabetes mellitus with hyperglycemia: Secondary | ICD-10-CM | POA: Diagnosis present

## 2018-12-24 DIAGNOSIS — N186 End stage renal disease: Secondary | ICD-10-CM | POA: Diagnosis present

## 2018-12-24 DIAGNOSIS — N184 Chronic kidney disease, stage 4 (severe): Secondary | ICD-10-CM | POA: Diagnosis present

## 2018-12-24 DIAGNOSIS — R0602 Shortness of breath: Secondary | ICD-10-CM

## 2018-12-24 DIAGNOSIS — K219 Gastro-esophageal reflux disease without esophagitis: Secondary | ICD-10-CM | POA: Diagnosis present

## 2018-12-24 DIAGNOSIS — Z8249 Family history of ischemic heart disease and other diseases of the circulatory system: Secondary | ICD-10-CM

## 2018-12-24 DIAGNOSIS — R7881 Bacteremia: Secondary | ICD-10-CM

## 2018-12-24 DIAGNOSIS — L03119 Cellulitis of unspecified part of limb: Secondary | ICD-10-CM | POA: Diagnosis not present

## 2018-12-24 DIAGNOSIS — Z886 Allergy status to analgesic agent status: Secondary | ICD-10-CM | POA: Diagnosis not present

## 2018-12-24 DIAGNOSIS — M7989 Other specified soft tissue disorders: Secondary | ICD-10-CM | POA: Diagnosis not present

## 2018-12-24 DIAGNOSIS — B954 Other streptococcus as the cause of diseases classified elsewhere: Secondary | ICD-10-CM | POA: Diagnosis not present

## 2018-12-24 DIAGNOSIS — A491 Streptococcal infection, unspecified site: Secondary | ICD-10-CM | POA: Diagnosis present

## 2018-12-24 DIAGNOSIS — N185 Chronic kidney disease, stage 5: Secondary | ICD-10-CM | POA: Diagnosis present

## 2018-12-24 DIAGNOSIS — Z7982 Long term (current) use of aspirin: Secondary | ICD-10-CM

## 2018-12-24 DIAGNOSIS — I10 Essential (primary) hypertension: Secondary | ICD-10-CM | POA: Diagnosis not present

## 2018-12-24 DIAGNOSIS — I358 Other nonrheumatic aortic valve disorders: Secondary | ICD-10-CM

## 2018-12-24 DIAGNOSIS — I38 Endocarditis, valve unspecified: Secondary | ICD-10-CM

## 2018-12-24 DIAGNOSIS — Z794 Long term (current) use of insulin: Secondary | ICD-10-CM

## 2018-12-24 DIAGNOSIS — I340 Nonrheumatic mitral (valve) insufficiency: Secondary | ICD-10-CM | POA: Diagnosis not present

## 2018-12-24 DIAGNOSIS — E119 Type 2 diabetes mellitus without complications: Secondary | ICD-10-CM

## 2018-12-24 DIAGNOSIS — Z880 Allergy status to penicillin: Secondary | ICD-10-CM | POA: Diagnosis not present

## 2018-12-24 DIAGNOSIS — Z79899 Other long term (current) drug therapy: Secondary | ICD-10-CM

## 2018-12-24 DIAGNOSIS — Z885 Allergy status to narcotic agent status: Secondary | ICD-10-CM

## 2018-12-24 LAB — COMPREHENSIVE METABOLIC PANEL
ALT: 133 U/L — ABNORMAL HIGH (ref 0–44)
AST: 145 U/L — ABNORMAL HIGH (ref 15–41)
Albumin: 2.6 g/dL — ABNORMAL LOW (ref 3.5–5.0)
Alkaline Phosphatase: 99 U/L (ref 38–126)
Anion gap: 12 (ref 5–15)
BUN: 43 mg/dL — ABNORMAL HIGH (ref 6–20)
CO2: 18 mmol/L — ABNORMAL LOW (ref 22–32)
Calcium: 7.8 mg/dL — ABNORMAL LOW (ref 8.9–10.3)
Chloride: 101 mmol/L (ref 98–111)
Creatinine, Ser: 3.43 mg/dL — ABNORMAL HIGH (ref 0.44–1.00)
GFR calc Af Amer: 16 mL/min — ABNORMAL LOW (ref >60)
GFR calc non Af Amer: 14 mL/min — ABNORMAL LOW (ref >60)
Glucose, Bld: 314 mg/dL — ABNORMAL HIGH (ref 70–99)
Potassium: 5.7 mmol/L — ABNORMAL HIGH (ref 3.5–5.1)
Sodium: 131 mmol/L — ABNORMAL LOW (ref 135–145)
Total Bilirubin: 0.8 mg/dL (ref 0.3–1.2)
Total Protein: 6.1 g/dL — ABNORMAL LOW (ref 6.5–8.1)

## 2018-12-24 LAB — CBC
HCT: 30.9 % — ABNORMAL LOW (ref 36.0–46.0)
Hemoglobin: 9.3 g/dL — ABNORMAL LOW (ref 12.0–15.0)
MCH: 28.5 pg (ref 26.0–34.0)
MCHC: 30.1 g/dL (ref 30.0–36.0)
MCV: 94.8 fL (ref 80.0–100.0)
Platelets: 153 10*3/uL (ref 150–400)
RBC: 3.26 MIL/uL — ABNORMAL LOW (ref 3.87–5.11)
RDW: 13.2 % (ref 11.5–15.5)
WBC: 18 10*3/uL — ABNORMAL HIGH (ref 4.0–10.5)
nRBC: 0 % (ref 0.0–0.2)

## 2018-12-24 LAB — BLOOD CULTURE ID PANEL (REFLEXED)

## 2018-12-24 LAB — GLUCOSE, CAPILLARY
Glucose-Capillary: 368 mg/dL — ABNORMAL HIGH (ref 70–99)
Glucose-Capillary: 211 mg/dL — ABNORMAL HIGH (ref 70–99)
Glucose-Capillary: 222 mg/dL — ABNORMAL HIGH (ref 70–99)

## 2018-12-24 LAB — SEDIMENTATION RATE: Sed Rate: 65 mm/hr — ABNORMAL HIGH (ref 0–22)

## 2018-12-24 LAB — C-REACTIVE PROTEIN: CRP: 20.6 mg/dL — ABNORMAL HIGH (ref <1.0)

## 2018-12-24 MED ORDER — SODIUM CHLORIDE 0.9 % IV SOLN
2.0000 g | INTRAVENOUS | Status: DC
  Administered 2018-12-24 – 2018-12-26 (×3): 2 g via INTRAVENOUS
  Filled 2018-12-24 (×5): qty 20

## 2018-12-24 MED ORDER — ACETAMINOPHEN 325 MG PO TABS
650.0000 mg | ORAL_TABLET | ORAL | Status: AC | PRN
  Administered 2018-12-25 (×3): 650 mg via ORAL
  Filled 2018-12-24 (×3): qty 2

## 2018-12-24 MED ORDER — SODIUM CHLORIDE 0.9 % IV BOLUS
500.0000 mL | Freq: Once | INTRAVENOUS | Status: AC
  Administered 2018-12-25: 500 mL via INTRAVENOUS

## 2018-12-24 MED ORDER — ACETAMINOPHEN 325 MG PO TABS
650.0000 mg | ORAL_TABLET | Freq: Four times a day (QID) | ORAL | Status: DC | PRN
  Administered 2018-12-24: 650 mg via ORAL
  Filled 2018-12-24: qty 2

## 2018-12-24 MED ORDER — ASPIRIN EC 325 MG PO TBEC
325.0000 mg | DELAYED_RELEASE_TABLET | Freq: Every day | ORAL | Status: DC
  Administered 2018-12-25 – 2018-12-31 (×7): 325 mg via ORAL
  Filled 2018-12-24 (×7): qty 1

## 2018-12-24 MED ORDER — GABAPENTIN 600 MG PO TABS
600.0000 mg | ORAL_TABLET | Freq: Every day | ORAL | Status: DC
  Administered 2018-12-24 – 2018-12-30 (×7): 600 mg via ORAL
  Filled 2018-12-24 (×7): qty 1

## 2018-12-24 MED ORDER — INSULIN ASPART 100 UNIT/ML ~~LOC~~ SOLN
0.0000 [IU] | Freq: Every day | SUBCUTANEOUS | Status: DC
  Administered 2018-12-24: 2 [IU] via SUBCUTANEOUS

## 2018-12-24 MED ORDER — HEPARIN SODIUM (PORCINE) 5000 UNIT/ML IJ SOLN
5000.0000 [IU] | Freq: Three times a day (TID) | INTRAMUSCULAR | Status: DC
  Administered 2018-12-24 – 2018-12-29 (×14): 5000 [IU] via SUBCUTANEOUS
  Filled 2018-12-24 (×14): qty 1

## 2018-12-24 MED ORDER — SODIUM CHLORIDE 0.9 % IV SOLN: 1.0000 g | INTRAVENOUS | Status: DC

## 2018-12-24 MED ORDER — REPAGLINIDE 1 MG PO TABS
1.0000 mg | ORAL_TABLET | Freq: Three times a day (TID) | ORAL | Status: DC
  Administered 2018-12-25 – 2018-12-31 (×17): 1 mg via ORAL
  Filled 2018-12-24 (×21): qty 1

## 2018-12-24 MED ORDER — PANTOPRAZOLE SODIUM 40 MG PO TBEC
40.0000 mg | DELAYED_RELEASE_TABLET | Freq: Every day | ORAL | Status: DC
  Administered 2018-12-24 – 2018-12-31 (×8): 40 mg via ORAL
  Filled 2018-12-24 (×8): qty 1

## 2018-12-24 MED ORDER — DULAGLUTIDE 1.5 MG/0.5ML ~~LOC~~ SOAJ: 1.5000 mg | SUBCUTANEOUS | Status: DC

## 2018-12-24 MED ORDER — DOCUSATE SODIUM 100 MG PO CAPS
200.0000 mg | ORAL_CAPSULE | Freq: Once | ORAL | Status: AC
  Administered 2018-12-24: 200 mg via ORAL
  Filled 2018-12-24: qty 2

## 2018-12-24 MED ORDER — SODIUM CHLORIDE 0.9 % IV SOLN
INTRAVENOUS | Status: DC
  Administered 2018-12-25 – 2018-12-26 (×2): via INTRAVENOUS

## 2018-12-24 MED ORDER — AMLODIPINE BESYLATE 10 MG PO TABS
10.0000 mg | ORAL_TABLET | Freq: Every day | ORAL | Status: DC
  Administered 2018-12-25 – 2018-12-31 (×7): 10 mg via ORAL
  Filled 2018-12-24 (×7): qty 1

## 2018-12-24 MED ORDER — ISOSORBIDE MONONITRATE ER 60 MG PO TB24
120.0000 mg | ORAL_TABLET | Freq: Every day | ORAL | Status: DC
  Administered 2018-12-25 – 2018-12-31 (×7): 120 mg via ORAL
  Filled 2018-12-24 (×7): qty 2

## 2018-12-24 MED ORDER — ALBUTEROL SULFATE (2.5 MG/3ML) 0.083% IN NEBU
2.5000 mg | INHALATION_SOLUTION | Freq: Four times a day (QID) | RESPIRATORY_TRACT | Status: DC | PRN
  Administered 2018-12-25 – 2018-12-28 (×3): 2.5 mg via RESPIRATORY_TRACT
  Filled 2018-12-24 (×2): qty 3

## 2018-12-24 MED ORDER — DOCUSATE SODIUM 100 MG PO CAPS
100.0000 mg | ORAL_CAPSULE | Freq: Two times a day (BID) | ORAL | Status: DC
  Administered 2018-12-25 – 2018-12-31 (×13): 100 mg via ORAL
  Filled 2018-12-24 (×13): qty 1

## 2018-12-24 MED ORDER — CLONIDINE HCL 0.2 MG PO TABS
0.3000 mg | ORAL_TABLET | Freq: Two times a day (BID) | ORAL | Status: DC
  Administered 2018-12-24 – 2018-12-31 (×14): 0.3 mg via ORAL
  Filled 2018-12-24 (×14): qty 1

## 2018-12-24 MED ORDER — TRAMADOL HCL 50 MG PO TABS
50.0000 mg | ORAL_TABLET | Freq: Four times a day (QID) | ORAL | Status: DC | PRN
  Administered 2018-12-24 – 2018-12-25 (×2): 50 mg via ORAL
  Filled 2018-12-24 (×3): qty 1

## 2018-12-24 MED ORDER — INSULIN ASPART 100 UNIT/ML ~~LOC~~ SOLN
0.0000 [IU] | Freq: Three times a day (TID) | SUBCUTANEOUS | Status: DC
  Administered 2018-12-24: 9 [IU] via SUBCUTANEOUS

## 2018-12-24 MED ORDER — ALBUTEROL SULFATE HFA 108 (90 BASE) MCG/ACT IN AERS: 1.0000 | INHALATION_SPRAY | Freq: Four times a day (QID) | RESPIRATORY_TRACT | Status: DC | PRN

## 2018-12-24 MED ORDER — SODIUM CHLORIDE 0.9 % IV SOLN
1.0000 g | Freq: Once | INTRAVENOUS | Status: AC
  Administered 2018-12-24: 1 g via INTRAVENOUS
  Filled 2018-12-24: qty 1

## 2018-12-24 MED ORDER — DULOXETINE HCL 30 MG PO CPEP
30.0000 mg | ORAL_CAPSULE | Freq: Every day | ORAL | Status: DC
  Administered 2018-12-25 – 2018-12-31 (×7): 30 mg via ORAL
  Filled 2018-12-24 (×7): qty 1

## 2018-12-24 MED ORDER — VANCOMYCIN HCL 10 G IV SOLR
2500.0000 mg | Freq: Once | INTRAVENOUS | Status: AC
  Administered 2018-12-24: 2500 mg via INTRAVENOUS
  Filled 2018-12-24: qty 2500

## 2018-12-24 NOTE — Progress Notes (Signed)
Orthopedic Tech Progress Note Patient Details:  Rachel Holmes December 13, 1960 [id number]  Casting Type of Cast: Short leg cast Cast Intervention: Removal  Post Interventions Patient Tolerated: Well Instructions Provided: Care of device     Karolee Stamps 12/24/2018, 1:58 PM

## 2018-12-24 NOTE — H&P (Signed)
History and Physical    Rachel Holmes GXQ:[id number] DOB: May 16, 1961 DOA: 12/24/2018  PCP: Sharyne Peach, MD  Patient coming from: home   I have personally briefly reviewed patient's old medical records available.   Chief Complaint: High fever and leg swelling.  Flulike symptoms.  HPI: Rachel Holmes is a 58 y.o. female with medical history significant of Type 2 diabetes on Trulicity, hypertension, peripheral neuropathy, Charcot joint and chronic left lateral malleoli pressure ulcer presented to the hospital with flulike symptoms and fever for 1 day.  According to the patient, she had left lateral malleoli wound for last 3 months and follows up with a podiatrist at [facility] and she has been using a walking cast.  She developed the wound with a boot and is following up at wound care center.  Yesterday morning, she started having flulike symptoms, generalized body ache, lethargy and headache.  She had temperature of 102.  So she came to the emergency room.  She had no shortness of breath or rhinorrhea.  No sick contacts.  She was evaluated in the ER, negative flu test.  Blood cultures were drawn.  Her leukocyte count was normal.  Patient was recommended supportive care and sent home.  She has still been feeling slightly weaker and had some body ache but since then she has felt better.  Her blood cultures grew Streptococcus, final identification pending and she was called back to the ER.  On examination of her leg wound there is obvious cellulitis and inflammation. ED Course: Hemodynamically stable.  WBC count 18,000.  Creatinine 3.43, slightly above the baseline.  Given vancomycin and cefepime in the ER.  Review of Systems: As per HPI otherwise 10 point review of systems negative.  Her urine and bowel habits are normal.  No focal weakness.  Past Medical History:  Diagnosis Date  . Allergic rhinitis   . Anemia   . Anxiety   . Back pain   . Bradycardia   . Breast mass     Patient can no longer palpate specific masses but showed tech general area of concern  . CHF (congestive heart failure) ([place])   . CKD (chronic kidney disease)    STAGE 3  . Constipation   . Diabetes mellitus without complication ([place])   . Dyspnea   . GERD (gastroesophageal reflux disease)   . HLD (hyperlipidemia)   . Hyperparathyroidism ([place])   . Hypertension   . Joint pain   . Leg edema   . Legally blind in left eye, as defined in [place]   . Lymphedema   . Onychomycosis     Past Surgical History:  Procedure Laterality Date  . ABDOMINAL HYSTERECTOMY    . BREAST BIOPSY Left 2014   FNA 12:00 position - Negative  . EYE SURGERY       reports that she has never smoked. She has never used smokeless tobacco. She reports that she does not drink alcohol or use drugs.  Allergies  Allergen Reactions  . Statins Shortness Of Breath    Wheezing, short of breath  . Codeine Nausea Only  . Ibuprofen Other (See Comments)    Reaction:  Raises pts BP  . Penicillins Hives and Other (See Comments)    Has patient had a PCN reaction causing immediate rash, facial/tongue/throat swelling, SOB or lightheadedness with hypotension: No Has patient had a PCN reaction causing severe rash involving mucus membranes or skin necrosis: No Has patient had a PCN reaction that required  hospitalization No Has patient had a PCN reaction occurring within the last 10 years: No If all of the above answers are "NO", then may proceed with Cephalosporin use.  [name] Percocet [Oxycodone-Acetaminophen] Nausea Only  . Tramadol Nausea Only    Can take if she has eaten  . Vicodin [Hydrocodone-Acetaminophen] Nausea Only    Family History  Problem Relation Age of Onset  . Breast cancer Sister 25  . Diabetes Sister   . Diabetes Mother   . Hypertension Mother   . Hyperlipidemia Mother   . Eating disorder Mother   . Obesity Mother      Prior to Admission medications   Medication Sig Start Date End Date Taking? Authorizing  Provider  albuterol (PROVENTIL HFA;VENTOLIN HFA) 108 (90 Base) MCG/ACT inhaler Inhale 1-2 puffs into the lungs every 6 (six) hours as needed for wheezing or shortness of breath.    [provider]  amLODipine (NORVASC) 10 MG tablet Take 10 mg by mouth daily.    [provider]  aspirin EC 325 MG tablet Take 325 mg by mouth daily.    [provider]  Blood Glucose Monitoring Suppl (ONE TOUCH ULTRA 2) w/Device KIT 1 Device by Does not apply route daily. 03/26/18   Renato Shin, MD  cloNIDine (CATAPRES) 0.3 MG tablet Take 0.3 mg by mouth 2 (two) times daily.    [provider]  dapagliflozin propanediol (FARXIGA) 5 MG TABS tablet Take 5 mg by mouth daily. Patient not taking: Reported on 12/23/2018 09/25/18   Renato Shin, MD  Dulaglutide (TRULICITY) 1.5 AV/6.9VX SOPN Inject 1.5 mg into the skin once a week. Patient taking differently: Inject 1.5 mg into the skin once a week. sunday 08/21/18   Renato Shin, MD  DULoxetine (CYMBALTA) 30 MG capsule Take 30 mg by mouth daily.  08/28/18   [provider]  gabapentin (NEURONTIN) 600 MG tablet Take 600 mg by mouth at bedtime.    [provider]  glucose blood (ONETOUCH VERIO) test strip 1 each by Other route 2 (two) times daily. And lancets 1/day 11/25/18   Renato Shin, MD  isosorbide mononitrate (IMDUR) 120 MG 24 hr tablet Take 120 mg by mouth daily.    [provider]  Lancets [facility] ULTRASOFT) lancets Used to check blood sugars four times daily. 03/26/18   Renato Shin, MD  Multiple Vitamin (MULTIVITAMIN WITH MINERALS) TABS tablet Take 1 tablet by mouth daily.    [provider]  omeprazole (PRILOSEC) 40 MG capsule Take 40 mg by mouth 2 (two) times daily.    [provider]  repaglinide (PRANDIN) 1 MG tablet Take 1 tablet (1 mg total) by mouth 3 (three) times daily before meals. 11/30/18   Renato Shin, MD  spironolactone (ALDACTONE) 50 MG tablet Take 50 mg by mouth  daily.    [provider]  traMADol (ULTRAM) 50 MG tablet Take 50 mg by mouth every 6 (six) hours as needed for moderate pain or severe pain (Pain). Take with food    [provider]  Vitamin D, Ergocalciferol, (DRISDOL) 1.25 MG (50000 UT) CAPS capsule Take 1 capsule (50,000 Units total) by mouth every 7 (seven) days. Patient not taking: Reported on 12/23/2018 09/21/18   Dennard Nip D, MD    Physical Exam: Vitals:   12/24/18 1159 12/24/18 1500  BP: 129/71   Pulse: 90   Resp: 18   Temp: 98.7 F (37.1 C)   SpO2: 98%   Weight:  (!) 138.8 kg  Height:  5' 6"  (1.676 m)    Constitutional: NAD, calm, comfortable Vitals:   12/24/18 1159 12/24/18 1500  BP: 129/71   Pulse: 90   Resp: 18   Temp: 98.7 F (37.1 C)   SpO2: 98%   Weight:  (!) 138.8 kg  Height:  5' 6"  (1.676 m)   Eyes: PERRL, lids and conjunctivae normal ENMT: Mucous membranes are moist. Posterior pharynx clear of any exudate or lesions.Normal dentition.  Neck: normal, supple, no masses, no thyromegaly Respiratory: clear to auscultation bilaterally, no wheezing, no crackles. Normal respiratory effort. No accessory muscle use.  Cardiovascular: Regular rate and rhythm, no murmurs / rubs / gallops. No extremity edema. 2+ pedal pulses. No carotid bruits.  Abdomen: no tenderness, no masses palpated. No hepatosplenomegaly. Bowel sounds positive.  Obese. Musculoskeletal: no clubbing / cyanosis. No joint deformity upper and lower extremities. Good ROM, no contractures. Normal muscle tone.  Skin: no rashes, lesions, ulcers. No induration Neurologic: CN 2-12 grossly intact. Sensation intact, DTR normal. Strength 5/5 in all 4.  Psychiatric: Normal judgment and insight. Alert and oriented x 3. Normal mood.  Pictures in the chart.      Labs on Admission: I have personally reviewed following labs and imaging studies  CBC: Recent Labs  Lab 12/23/18 0943 12/24/18 1253  WBC 10.8* 18.0*  NEUTROABS 9.7*  --    HGB 11.1* 9.3*  HCT 35.8* 30.9*  MCV 93.7 94.8  PLT 223 174   Basic Metabolic Panel: Recent Labs  Lab 12/23/18 0943 12/24/18 1253  NA 138 131*  K 5.0 5.7*  CL 106 101  CO2 21* 18*  GLUCOSE 159* 314*  BUN 36* 43*  CREATININE 2.75* 3.43*  CALCIUM 8.9 7.8*   GFR: Estimated Creatinine Clearance: 26 mL/min (A) (by C-G formula based on SCr of 3.43 mg/dL (H)). Liver Function Tests: Recent Labs  Lab 12/23/18 0943 12/24/18 1253  AST 69* 145*  ALT 39 133*  ALKPHOS 116 99  BILITOT 0.8 0.8  PROT 8.0 6.1*  ALBUMIN 3.9 2.6*   No results for input(s): LIPASE, AMYLASE in the last 168 hours. No results for input(s): AMMONIA in the last 168 hours. Coagulation Profile: No results for input(s): INR, PROTIME in the last 168 hours. Cardiac Enzymes: No results for input(s): CKTOTAL, CKMB, CKMBINDEX, TROPONINI in the last 168 hours. BNP (last 3 results) No results for input(s): PROBNP in the last 8760 hours. HbA1C: No results for input(s): HGBA1C in the last 72 hours. CBG: No results for input(s): GLUCAP in the last 168 hours. Lipid Profile: No results for input(s): CHOL, HDL, LDLCALC, TRIG, CHOLHDL, LDLDIRECT in the last 72 hours. Thyroid Function Tests: No results for input(s): TSH, T4TOTAL, FREET4, T3FREE, THYROIDAB in the last 72 hours. Anemia Panel: No results for input(s): VITAMINB12, FOLATE, FERRITIN, TIBC, IRON, RETICCTPCT in the last 72 hours. Urine analysis:    Component Value Date/Time   COLORURINE YELLOW 12/23/2018 1145   APPEARANCEUR CLEAR 12/23/2018 1145   LABSPEC 1.012 12/23/2018 1145   PHURINE 6.0 12/23/2018 1145   GLUCOSEU NEGATIVE 12/23/2018 1145   HGBUR NEGATIVE 12/23/2018 1145   BILIRUBINUR NEGATIVE 12/23/2018 1145   KETONESUR NEGATIVE 12/23/2018 1145   PROTEINUR 100 (A) 12/23/2018 1145   NITRITE NEGATIVE 12/23/2018 1145   LEUKOCYTESUR TRACE (A) 12/23/2018 1145    Radiological Exams on Admission: Dg Chest 2 View  Result Date: 12/23/2018 CLINICAL  DATA:  Fever and chills EXAM: CHEST - 2 VIEW COMPARISON:  February 03, 2017 FINDINGS: The lungs are clear. The heart  size and pulmonary vascularity are normal. No adenopathy. There is degenerative change in the lower thoracic spine. IMPRESSION: No edema or consolidation. Electronically Signed   By: Lowella Grip III M.D.   On: 12/23/2018 10:14    Assessment/Plan Principal Problem:   Bacteremia due to Streptococcus Active Problems:   Charcot ankle, left   Diabetes (HCC)   Essential hypertension   CKD (chronic kidney disease) stage 4, GFR 15-29 ml/min (HCC)   Acute renal failure superimposed on stage 3 chronic kidney disease (HCC)   Cellulitis in diabetic foot (HCC)   Streptococcus infection     1.  Streptococcus bacteremia/leukocytosis/spreading inflammation and cellulitis of the left leg: Agree with admission given severity of symptoms.  Patient was given vancomycin and cefepime in the ER.  Will discontinue vancomycin.  Continue cefepime pending final recommendations. Streptococcus species was identified as negative for enterococcus. Repeat blood cultures today. 2D echocardiogram. We will consult  with infectious disease for further antibiotic management.  2.  Left leg pressure injury with cellulitis: No evidence of localized abscess.  This time not needing any surgical drainage.  Will consult wound care, padding and use of walking boot.  3.  Type 2 diabetes on insulin: Patient is on Trulicity.  She will continue this.  Will keep on sliding scale insulin.  4.  Acute kidney injury on chronic kidney disease stage IV: Aggravated due to above.  Will hydrate and monitor levels.  5.  Essential hypertension: Resume home medications.  Fairly controlled.    DVT prophylaxis: Subcu heparin. Code Status: Full code. Family Communication: Husband at bedside. Disposition Plan: Home with home infusion therapy. Consults called: Infectious disease. Admission status: Inpatient.   Barb Merino  MD [facility] Pager [phone]  If 7PM-7AM, please contact night-coverage [email or web address] Password [email or web address]  12/24/2018, 3:30 PM

## 2018-12-24 NOTE — Telephone Encounter (Signed)
Received call from micro lab with positive blood culture results. Consulted with Dr. Florina Ou. Advised to follow up with pt and if she isn't feeling better to follow up in ED. If pt is feeling better follow up with PMD. Will have day shift charge nurse call pt in the morning.

## 2018-12-24 NOTE — ED Triage Notes (Signed)
Pt returns to ER because she was called by RN to get medications re-evaluated based on labs. No new symptoms today.   RN Note: Received call from micro lab with positive blood culture results. Consulted with Dr. Florina Ou. Advised to follow up with pt and if she isn't feeling better to follow up in ED

## 2018-12-24 NOTE — ED Provider Notes (Signed)
[facility] EMERGENCY DEPARTMENT Provider Note   CSN: [id number] Arrival date & time: 12/24/18  1149    History   Chief Complaint Chief Complaint  Patient presents with  . Medication Refill    Re-eval     HPI KRISY DIX is a 58 y.o. female.     HPI Patient is a 58 year old female who presents the emergency department after being called by the emergency department secondary to a positive blood culture.  She is growing strep out of her blood based on a blood culture obtained 2 days ago.  2 days ago she was in the ER with a temp of 102 without significant complaints.  She was flu negative at that time was complaining of fever.  No clear etiology was found and she was discharged home.  She does have a chronic wound of her left lower extremity which is under a cast.  Cast has been removed here in the emergency department and she has new redness of her left lower extremity consistent with cellulitis.  Patient reports it is painful to the touch and red hot.  Continuing to have fever and feeling poor at home.  Symptoms are mild to moderate in severity  Past Medical History:  Diagnosis Date  . Allergic rhinitis   . Anemia   . Anxiety   . Back pain   . Bradycardia   . Breast mass    Patient can no longer palpate specific masses but showed tech general area of concern  . CHF (congestive heart failure) ([place])   . CKD (chronic kidney disease)    STAGE 3  . Constipation   . Diabetes mellitus without complication ([place])   . Dyspnea   . GERD (gastroesophageal reflux disease)   . HLD (hyperlipidemia)   . Hyperparathyroidism ([place])   . Hypertension   . Joint pain   . Leg edema   . Legally blind in left eye, as defined in [place]   . Lymphedema   . Onychomycosis     Patient Active Problem List   Diagnosis Date Noted  . Malignant hypertension (arteriolar nephrosclerosis), stage 1-4 or unspecified chronic kidney disease 07/15/2018  . CKD (chronic kidney disease) stage  4, GFR 15-29 ml/min (HCC) 07/15/2018  . Morbid obesity with BMI of 45.0-49.9, adult ([place]) 07/15/2018  . Excessive daytime sleepiness 07/15/2018  . Other fatigue 03/11/2018  . Shortness of breath on exertion 03/11/2018  . Essential hypertension 03/11/2018  . Vitamin D deficiency 03/11/2018  . Congestive heart failure ([place]) 03/11/2018  . Degenerative arthritis of right knee 01/06/2018  . Diabetes ([place]) 10/03/2017  . Knee pain 10/02/2017  . Obesity 10/02/2017  . Charcot ankle, left 08/29/2017  . Lymphedema 09/06/2016  . Hyponatremia with extracellular fluid depletion 02/02/2016  . Nausea with vomiting, unspecified 02/02/2016  . Closed nondisplaced fracture of right patella 08/23/2015  . Acute cystitis without hematuria 08/14/2014  . Colitis 08/14/2014  . HTN (hypertension), malignant 08/14/2014  . Acute renal failure superimposed on stage 3 chronic kidney disease ([place]) 06/26/2014  . Hyperparathyroidism, unspecified ([place]) 02/24/2014  . Allergic rhinitis 02/22/2014  . Onychomycosis 02/22/2014  . Anxiety 11/05/2013  . Bradycardia 05/14/2013  . Gastro-esophageal reflux disease without esophagitis 04/05/2013  . Anemia in other chronic diseases classified elsewhere 01/27/2013    Past Surgical History:  Procedure Laterality Date  . ABDOMINAL HYSTERECTOMY    . BREAST BIOPSY Left 2014   FNA 12:00 position - Negative  . EYE SURGERY  OB History    Gravida  2   Para      Term      Preterm      AB      Living  2     SAB      TAB      Ectopic      Multiple      Live Births               Home Medications    Prior to Admission medications   Medication Sig Start Date End Date Taking? Authorizing Provider  albuterol (PROVENTIL HFA;VENTOLIN HFA) 108 (90 Base) MCG/ACT inhaler Inhale 1-2 puffs into the lungs every 6 (six) hours as needed for wheezing or shortness of breath.    [provider]  amLODipine (NORVASC) 10 MG tablet Take 10 mg by mouth daily.     [provider]  aspirin EC 325 MG tablet Take 325 mg by mouth daily.    [provider]  Blood Glucose Monitoring Suppl (ONE TOUCH ULTRA 2) w/Device KIT 1 Device by Does not apply route daily. 03/26/18   Renato Shin, MD  cloNIDine (CATAPRES) 0.3 MG tablet Take 0.3 mg by mouth 2 (two) times daily.    [provider]  dapagliflozin propanediol (FARXIGA) 5 MG TABS tablet Take 5 mg by mouth daily. Patient not taking: Reported on 12/23/2018 09/25/18   Renato Shin, MD  Dulaglutide (TRULICITY) 1.5 DE/0.8XK SOPN Inject 1.5 mg into the skin once a week. Patient taking differently: Inject 1.5 mg into the skin once a week. sunday 08/21/18   Renato Shin, MD  DULoxetine (CYMBALTA) 30 MG capsule Take 30 mg by mouth daily.  08/28/18   [provider]  gabapentin (NEURONTIN) 600 MG tablet Take 600 mg by mouth at bedtime.    [provider]  glucose blood (ONETOUCH VERIO) test strip 1 each by Other route 2 (two) times daily. And lancets 1/day 11/25/18   Renato Shin, MD  isosorbide mononitrate (IMDUR) 120 MG 24 hr tablet Take 120 mg by mouth daily.    [provider]  Lancets [facility] ULTRASOFT) lancets Used to check blood sugars four times daily. 03/26/18   Renato Shin, MD  Multiple Vitamin (MULTIVITAMIN WITH MINERALS) TABS tablet Take 1 tablet by mouth daily.    [provider]  omeprazole (PRILOSEC) 40 MG capsule Take 40 mg by mouth 2 (two) times daily.    [provider]  repaglinide (PRANDIN) 1 MG tablet Take 1 tablet (1 mg total) by mouth 3 (three) times daily before meals. 11/30/18   Renato Shin, MD  spironolactone (ALDACTONE) 50 MG tablet Take 50 mg by mouth daily.    [provider]  traMADol (ULTRAM) 50 MG tablet Take 50 mg by mouth every 6 (six) hours as needed for moderate pain or severe pain (Pain). Take with food    [provider]  Vitamin D, Ergocalciferol, (DRISDOL) 1.25 MG (50000 UT) CAPS capsule  Take 1 capsule (50,000 Units total) by mouth every 7 (seven) days. Patient not taking: Reported on 12/23/2018 09/21/18   Starlyn Skeans, MD    Family History Family History  Problem Relation Age of Onset  . Breast cancer Sister 55  . Diabetes Sister   . Diabetes Mother   . Hypertension Mother   . Hyperlipidemia Mother   . Eating disorder Mother   . Obesity Mother     Social History Social History   Tobacco Use  . Smoking  status: Never Smoker  . Smokeless tobacco: Never Used  Substance Use Topics  . Alcohol use: No  . Drug use: Never     Allergies   Statins; Codeine; Ibuprofen; Penicillins; Percocet [oxycodone-acetaminophen]; Tramadol; and Vicodin [hydrocodone-acetaminophen]   Review of Systems Review of Systems  All other systems reviewed and are negative.    Physical Exam Updated Vital Signs BP 129/71   Pulse 90   Temp 98.7 F (37.1 C)   Resp 18   SpO2 98%   Physical Exam Vitals signs and nursing note reviewed.  Constitutional:      General: She is not in acute distress.    Appearance: She is well-developed.  HENT:     Head: Normocephalic and atraumatic.  Neck:     Musculoskeletal: Normal range of motion.  Cardiovascular:     Rate and Rhythm: Normal rate and regular rhythm.     Heart sounds: Normal heart sounds.  Pulmonary:     Effort: Pulmonary effort is normal.     Breath sounds: Normal breath sounds.  Abdominal:     General: There is no distension.     Palpations: Abdomen is soft.     Tenderness: There is no abdominal tenderness.  Musculoskeletal: Normal range of motion.     Comments: Cellulitis left lower extremity from the left foot to the left mid tibia with associated warmth and erythema.  Healing wound over left lateral malleolus without purulent drainage  Skin:    General: Skin is warm and dry.  Neurological:     Mental Status: She is alert and oriented to person, place, and time.  Psychiatric:        Judgment: Judgment normal.       ED Treatments / Results  Labs (all labs ordered are listed, but only abnormal results are displayed) Labs Reviewed  CBC - Abnormal; Notable for the following components:      Result Value   WBC 18.0 (*)    RBC 3.26 (*)    Hemoglobin 9.3 (*)    HCT 30.9 (*)    All other components within normal limits  CULTURE, BLOOD (ROUTINE X 2)  CULTURE, BLOOD (ROUTINE X 2)  URINE CULTURE  COMPREHENSIVE METABOLIC PANEL  SEDIMENTATION RATE  C-REACTIVE PROTEIN  URINALYSIS, ROUTINE W REFLEX MICROSCOPIC    EKG None  Radiology Dg Chest 2 View  Result Date: 12/23/2018 CLINICAL DATA:  Fever and chills EXAM: CHEST - 2 VIEW COMPARISON:  February 03, 2017 FINDINGS: The lungs are clear. The heart size and pulmonary vascularity are normal. No adenopathy. There is degenerative change in the lower thoracic spine. IMPRESSION: No edema or consolidation. Electronically Signed   By: Lowella Grip III M.D.   On: 12/23/2018 10:14    Procedures Procedures (including critical care time)  Medications Ordered in ED Medications  ceFEPIme (MAXIPIME) 1 g in sodium chloride 0.9 % 100 mL IVPB (1 g Intravenous New Bag/Given 12/24/18 1358)  vancomycin (VANCOCIN) 2,500 mg in sodium chloride 0.9 % 500 mL IVPB (has no administration in time range)     Initial Impression / Assessment and Plan / ED Course  I have reviewed the triage vital signs and the nursing notes.  Pertinent labs & imaging results that were available during my care of the patient were reviewed by me and considered in my medical decision making (see chart for details).        Bacteremia and cellulitis of the left lower extremity.  Fever to 102 2 days ago.  Vanco and cefepime given here in the emergency department.  Repeat blood cultures.  Stable vital signs.  Admit to the hospital  Final Clinical Impressions(s) / ED Diagnoses   Final diagnoses:  Bacteremia    ED Discharge Orders    None       Jola Schmidt, MD 12/24/18 1423

## 2018-12-24 NOTE — Progress Notes (Signed)
Pharmacy Antibiotic Note  Rachel Holmes is a 58 y.o. female admitted on 12/24/2018 with bacteremia. Pharmacy has been consulted for cefepime dosing. Pt with Tmax 102.1 and WBC is elevated at 18. Scr is increasing to 3.43.   Plan: Cefepime 1g IV Q24H F/u renal fxn, C&S, clinical status   Height: 5\' 6"  (167.6 cm) Weight: (!) 306 lb (138.8 kg) IBW/kg (Calculated) : 59.3  Temp (24hrs), Avg:98.7 F (37.1 C), Min:98.7 F (37.1 C), Max:98.7 F (37.1 C)  Recent Labs  Lab 12/23/18 0943 12/24/18 1253  WBC 10.8* 18.0*  CREATININE 2.75* 3.43*  LATICACIDVEN 2.1*  --     Estimated Creatinine Clearance: 26 mL/min (A) (by C-G formula based on SCr of 3.43 mg/dL (H)).    Allergies  Allergen Reactions  . Statins Shortness Of Breath    Wheezing, short of breath  . Codeine Nausea Only  . Ibuprofen Other (See Comments)    Reaction:  Raises pts BP  . Penicillins Hives and Other (See Comments)    Has patient had a PCN reaction causing immediate rash, facial/tongue/throat swelling, SOB or lightheadedness with hypotension: No Has patient had a PCN reaction causing severe rash involving mucus membranes or skin necrosis: No Has patient had a PCN reaction that required hospitalization No Has patient had a PCN reaction occurring within the last 10 years: No If all of the above answers are "NO", then may proceed with Cephalosporin use.  [name] Percocet [Oxycodone-Acetaminophen] Nausea Only  . Tramadol Nausea Only    Can take if she has eaten  . Vicodin [Hydrocodone-Acetaminophen] Nausea Only    Antimicrobials this admission: Vanc 2/20>> Cefepime 2/20>>  Dose adjustments this admission: N/A  Microbiology results: BCID 2/19 - strep  Thank you for allowing pharmacy to be a part of this patient's care.  Erynn Vaca, Rande Lawman 12/24/2018 3:28 PM

## 2018-12-24 NOTE — Progress Notes (Signed)
Pharmacy Antibiotic Note  Rachel Holmes is a 58 y.o. female admitted on 12/24/2018, called to return to ED with postive blood cultures. SCr 2.75 yesterday. Cefepime x1 in ED.   Plan: -Vancomycin 2500 mg IV x1 -F/u SCr or VR + order maintenance doses as appropriate     Temp (24hrs), Avg:98.7 F (37.1 C), Min:98.7 F (37.1 C), Max:98.7 F (37.1 C)  Recent Labs  Lab 12/23/18 0943  WBC 10.8*  CREATININE 2.75*  LATICACIDVEN 2.1*    CrCl cannot be calculated (Unknown ideal weight.).    Allergies  Allergen Reactions  . Statins Shortness Of Breath    Wheezing, short of breath  . Codeine Nausea Only  . Ibuprofen Other (See Comments)    Reaction:  Raises pts BP  . Penicillins Hives and Other (See Comments)    Has patient had a PCN reaction causing immediate rash, facial/tongue/throat swelling, SOB or lightheadedness with hypotension: No Has patient had a PCN reaction causing severe rash involving mucus membranes or skin necrosis: No Has patient had a PCN reaction that required hospitalization No Has patient had a PCN reaction occurring within the last 10 years: No If all of the above answers are "NO", then may proceed with Cephalosporin use.  [name] Percocet [Oxycodone-Acetaminophen] Nausea Only  . Tramadol Nausea Only    Can take if she has eaten  . Vicodin [Hydrocodone-Acetaminophen] Nausea Only     Harvel Quale 12/24/2018 1:08 PM

## 2018-12-25 ENCOUNTER — Inpatient Hospital Stay (HOSPITAL_COMMUNITY): Payer: Medicare HMO

## 2018-12-25 ENCOUNTER — Other Ambulatory Visit (HOSPITAL_COMMUNITY): Payer: Medicare HMO

## 2018-12-25 DIAGNOSIS — M7989 Other specified soft tissue disorders: Secondary | ICD-10-CM

## 2018-12-25 DIAGNOSIS — Z885 Allergy status to narcotic agent status: Secondary | ICD-10-CM

## 2018-12-25 DIAGNOSIS — L03116 Cellulitis of left lower limb: Secondary | ICD-10-CM

## 2018-12-25 DIAGNOSIS — N179 Acute kidney failure, unspecified: Secondary | ICD-10-CM

## 2018-12-25 DIAGNOSIS — E1122 Type 2 diabetes mellitus with diabetic chronic kidney disease: Secondary | ICD-10-CM

## 2018-12-25 DIAGNOSIS — I509 Heart failure, unspecified: Secondary | ICD-10-CM

## 2018-12-25 DIAGNOSIS — R6520 Severe sepsis without septic shock: Secondary | ICD-10-CM

## 2018-12-25 DIAGNOSIS — E11610 Type 2 diabetes mellitus with diabetic neuropathic arthropathy: Secondary | ICD-10-CM

## 2018-12-25 DIAGNOSIS — N183 Chronic kidney disease, stage 3 (moderate): Secondary | ICD-10-CM

## 2018-12-25 DIAGNOSIS — I358 Other nonrheumatic aortic valve disorders: Secondary | ICD-10-CM

## 2018-12-25 DIAGNOSIS — R7881 Bacteremia: Secondary | ICD-10-CM

## 2018-12-25 DIAGNOSIS — A408 Other streptococcal sepsis: Principal | ICD-10-CM

## 2018-12-25 DIAGNOSIS — Z888 Allergy status to other drugs, medicaments and biological substances status: Secondary | ICD-10-CM

## 2018-12-25 DIAGNOSIS — N184 Chronic kidney disease, stage 4 (severe): Secondary | ICD-10-CM

## 2018-12-25 DIAGNOSIS — L97329 Non-pressure chronic ulcer of left ankle with unspecified severity: Secondary | ICD-10-CM

## 2018-12-25 DIAGNOSIS — B955 Unspecified streptococcus as the cause of diseases classified elsewhere: Secondary | ICD-10-CM

## 2018-12-25 DIAGNOSIS — B954 Other streptococcus as the cause of diseases classified elsewhere: Secondary | ICD-10-CM

## 2018-12-25 DIAGNOSIS — E11622 Type 2 diabetes mellitus with other skin ulcer: Secondary | ICD-10-CM

## 2018-12-25 DIAGNOSIS — Z880 Allergy status to penicillin: Secondary | ICD-10-CM

## 2018-12-25 DIAGNOSIS — Z886 Allergy status to analgesic agent status: Secondary | ICD-10-CM

## 2018-12-25 HISTORY — DX: Other nonrheumatic aortic valve disorders: I35.8

## 2018-12-25 LAB — BASIC METABOLIC PANEL
Anion gap: 10 (ref 5–15)
BUN: 41 mg/dL — ABNORMAL HIGH (ref 6–20)
CO2: 19 mmol/L — ABNORMAL LOW (ref 22–32)
Calcium: 7.5 mg/dL — ABNORMAL LOW (ref 8.9–10.3)
Chloride: 103 mmol/L (ref 98–111)
Creatinine, Ser: 3.28 mg/dL — ABNORMAL HIGH (ref 0.44–1.00)
GFR calc Af Amer: 17 mL/min — ABNORMAL LOW (ref >60)
GFR calc non Af Amer: 15 mL/min — ABNORMAL LOW (ref >60)
Glucose, Bld: 146 mg/dL — ABNORMAL HIGH (ref 70–99)
Potassium: 4.5 mmol/L (ref 3.5–5.1)
Sodium: 132 mmol/L — ABNORMAL LOW (ref 135–145)

## 2018-12-25 LAB — CBC
HCT: 27 % — ABNORMAL LOW (ref 36.0–46.0)
Hemoglobin: 8.3 g/dL — ABNORMAL LOW (ref 12.0–15.0)
MCH: 28.4 pg (ref 26.0–34.0)
MCHC: 30.7 g/dL (ref 30.0–36.0)
MCV: 92.5 fL (ref 80.0–100.0)
Platelets: 146 10*3/uL — ABNORMAL LOW (ref 150–400)
RBC: 2.92 MIL/uL — ABNORMAL LOW (ref 3.87–5.11)
RDW: 13.2 % (ref 11.5–15.5)
WBC: 15.5 10*3/uL — ABNORMAL HIGH (ref 4.0–10.5)
nRBC: 0 % (ref 0.0–0.2)

## 2018-12-25 LAB — GLUCOSE, CAPILLARY
Glucose-Capillary: 110 mg/dL — ABNORMAL HIGH (ref 70–99)
Glucose-Capillary: 126 mg/dL — ABNORMAL HIGH (ref 70–99)
Glucose-Capillary: 142 mg/dL — ABNORMAL HIGH (ref 70–99)
Glucose-Capillary: 122 mg/dL — ABNORMAL HIGH (ref 70–99)

## 2018-12-25 LAB — ECHOCARDIOGRAM COMPLETE
Height: 66 in
Weight: 4896 oz

## 2018-12-25 LAB — HIV ANTIBODY (ROUTINE TESTING W REFLEX): HIV Screen 4th Generation wRfx: NONREACTIVE

## 2018-12-25 MED ORDER — INSULIN ASPART 100 UNIT/ML ~~LOC~~ SOLN
0.0000 [IU] | Freq: Three times a day (TID) | SUBCUTANEOUS | Status: DC
  Administered 2018-12-25 – 2018-12-26 (×3): 2 [IU] via SUBCUTANEOUS
  Administered 2018-12-26 – 2018-12-27 (×5): 3 [IU] via SUBCUTANEOUS
  Administered 2018-12-28: 5 [IU] via SUBCUTANEOUS
  Administered 2018-12-28: 3 [IU] via SUBCUTANEOUS
  Administered 2018-12-29: 5 [IU] via SUBCUTANEOUS
  Administered 2018-12-29: 3 [IU] via SUBCUTANEOUS
  Administered 2018-12-30: 1 [IU] via SUBCUTANEOUS
  Administered 2018-12-30: 2 [IU] via SUBCUTANEOUS
  Administered 2018-12-30: 5 [IU] via SUBCUTANEOUS
  Administered 2018-12-31: 3 [IU] via SUBCUTANEOUS

## 2018-12-25 MED ORDER — INSULIN ASPART 100 UNIT/ML ~~LOC~~ SOLN
0.0000 [IU] | Freq: Every day | SUBCUTANEOUS | Status: DC
  Administered 2018-12-26: 2 [IU] via SUBCUTANEOUS

## 2018-12-25 MED ORDER — DULAGLUTIDE 1.5 MG/0.5ML ~~LOC~~ SOAJ
1.5000 mg | SUBCUTANEOUS | Status: DC
  Administered 2018-12-27: 1.5 mg via SUBCUTANEOUS
  Filled 2018-12-25: qty 1.5

## 2018-12-25 MED ORDER — ACETAMINOPHEN 325 MG PO TABS
325.0000 mg | ORAL_TABLET | Freq: Once | ORAL | Status: AC
  Administered 2018-12-25: 325 mg via ORAL
  Filled 2018-12-25: qty 1

## 2018-12-25 NOTE — Progress Notes (Signed)
    [facility] has been requested to perform a transesophageal echocardiogram on Rachel Holmes for bacteremia.  After careful review of history and examination, the risks and benefits of transesophageal echocardiogram have been explained to the patient and her husband including risks of esophageal damage, perforation (1:10,000 risk), bleeding, pharyngeal hematoma as well as other potential complications associated with conscious sedation including aspiration, arrhythmia, respiratory failure and death. Alternatives to treatment were discussed, questions were answered. Patient is willing to proceed.   Procedure is scheduled for 12/28/2018 at 12:15 with Dr. Golden Hurter.  Daune Perch, NP  12/25/2018 3:04 PM

## 2018-12-25 NOTE — Consult Note (Addendum)
[name] Nurse wound consult note Reason for Consult: Consult requested for left outer ankle.  Pt has a chronic full thickness wound and states she is treated as an outpatient by a podiatrist and they have been using Collagen dressings weekly and a contact cast prior to admission.  Left ankle and leg developed increased edema and erythremia this week; left calf is warm to the touch and swollen from ankle to below knee.  MRI results indicate: "Severe Charcot arthropathy of the hindfoot and midfoot, with dislocation of the talus; destruction of much of the talus; resorption/destruction of the middle and lateral cuneiforms; extensive erosion of the anterior calcaneus and portions of the cuboid; extensive erosions along the bases of the metatarsals; interval fragmentation of the medial malleolus; and concomitant ligamentous disruptions. 2. Diffuse subcutaneous edema in the ankle may be reactive or due to cellulitis. 3. Flexor and extensor tendinopathy is noted above. 4. Achilles tendinopathy distally. 5. Plantar fasciitis.   Please refer to ortho service for further plan of care for these complex medical conditions which are beyond the scope of practice for [facility] nurses.  Wound type: Right outer ankle with full thickness chronic wound; 3X3cm location surounded by dry darker-colored callous, raised above skin level.  Inner wound in the center is approx .3X.3X.2cm, moist and dark red, small amt tan drainage, no odor or fluctuance.  Dressing procedure/placement/frequency: Discussed with patient that [facility] does not carry Collagen dressings and we will substitute Aquacel and change weekly, as was the previous plan of care.  Contact cast is contraindicated while pt has cellulitis to the left leg.  She can resume follow-up with her podiatrist after discharge. Pt verbalized understanding. Please re-consult if further assistance is needed.  Thank-you,  Rachel Girt MSN, [place], [place], [place],  [place]

## 2018-12-25 NOTE — Progress Notes (Signed)
PROGRESS NOTE        PATIENT DETAILS Name: Rachel Holmes Age: 58 y.o. Sex: female Date of Birth: July 28, 1961 Admit Date: 12/24/2018 Admitting Physician Barb Merino, MD [id number], Rubbie Battiest, MD  Brief Narrative: Patient is a 58 y.o. female-2, hypertension, peripheral neuropathy, Charcot joint involving left  foot presenting with 3-4-day history of fever, myalgias, left leg swelling-evaluated in the ED on 2/19, blood cultures drawn and subsequently sent home.  Brought back to the ED after blood cultures were positive for Streptococcus.  See below for further details.  Subjective: Left leg continues to be tender, erythematous.  No chest pain or shortness of breath.  Assessment/Plan: Sepsis secondary to group G streptococcal bacteremia and left leg soft tissue infection: Sepsis pathophysiology slowly improving-patient still persistently febrile. Leukocytosis slowly downtrending as well.  Repeat blood cultures on 2/20- so far.  Since continues to have significant swelling of the left lower extremity (although compartments soft-no crepitus)-ED has ordered a MRI to evaluate soft tissues better.  Spoke with Dr. Lucianne Lei Dam-ID-recommendations are to continue Rocephin-have consulted cardiology to see if we can obtain a TEE next week.  Although  swelling of the left leg is likely secondary to infection-given prior history of VTE-we will obtain a Doppler study as well  AKI on CKD stage IV: AKI likely hemodynamic mediated in setting of sepsis, creatinine slowly downtrending-follow periodically.  Chronic diastolic heart failure: Volume status is stable-watch closely while patient is on IV fluids.  Hypertension: Blood pressure stable-continue amlodipine, clonidine and Imdur.  DM-2: CBGs on the higher side-change SSI to moderate scale, cautiously continue with Prandin and Trulicity.  We will continue to adjust over the next few days.  Left leg ulceration: Present prior to  admission-continue wound care per wound care team.  History of Charcot arthropathy  DVT Prophylaxis: Prophylactic Heparin   Code Status: Full code   Family Communication: Spouse at bedside  Disposition Plan: Remain inpatient  Antimicrobial agents: Anti-infectives (From admission, onward)   Start     Dose/Rate Route Frequency Ordered Stop   12/25/18 1400  ceFEPIme (MAXIPIME) 1 g in sodium chloride 0.9 % 100 mL IVPB  Status:  Discontinued     1 g 200 mL/hr over 30 Minutes Intravenous Every 24 hours 12/24/18 1529 12/24/18 1551   12/24/18 1600  cefTRIAXone (ROCEPHIN) 2 g in sodium chloride 0.9 % 100 mL IVPB     2 g 200 mL/hr over 30 Minutes Intravenous Every 24 hours 12/24/18 1551     12/24/18 1400  ceFEPIme (MAXIPIME) 1 g in sodium chloride 0.9 % 100 mL IVPB     1 g 200 mL/hr over 30 Minutes Intravenous  Once 12/24/18 1254 12/24/18 1613   12/24/18 1400  vancomycin (VANCOCIN) 2,500 mg in sodium chloride 0.9 % 500 mL IVPB     2,500 mg 250 mL/hr over 120 Minutes Intravenous  Once 12/24/18 1311 12/24/18 2007      Procedures: None  CONSULTS:  ID  Time spent: 25- minutes-Greater than 50% of this time was spent in counseling, explanation of diagnosis, planning of further management, and coordination of care.  MEDICATIONS: Scheduled Meds: . amLODipine  10 mg Oral Daily  . aspirin EC  325 mg Oral Daily  . cloNIDine  0.3 mg Oral BID  . docusate sodium  100 mg Oral BID  . Dulaglutide  1.5 mg Subcutaneous  Weekly  . DULoxetine  30 mg Oral Daily  . gabapentin  600 mg Oral QHS  . heparin  5,000 Units Subcutaneous Q8H  . insulin aspart  0-5 Units Subcutaneous QHS  . insulin aspart  0-9 Units Subcutaneous TID WC  . isosorbide mononitrate  120 mg Oral Daily  . pantoprazole  40 mg Oral Daily  . repaglinide  1 mg Oral TID AC   Continuous Infusions: . sodium chloride 125 mL/hr at 12/24/18 1700  . cefTRIAXone (ROCEPHIN)  IV 2 g (12/24/18 2208)   PRN Meds:.acetaminophen,  albuterol, traMADol   PHYSICAL EXAM: Vital signs: Vitals:   12/24/18 2145 12/25/18 0038 12/25/18 0232 12/25/18 0520  BP: (!) 182/74 (!) 191/77 (!) 149/58 (!) 164/75  Pulse: 99 (!) 102 90 89  Resp:   20 17  Temp: (!) 102.9 F (39.4 C) (!) 102.7 F (39.3 C) 99.6 F (37.6 C) 100.2 F (37.9 C)  TempSrc: Oral Oral Oral Oral  SpO2: 100%  96% 97%  Weight:      Height:       Filed Weights   12/24/18 1500  Weight: (!) 138.8 kg   Body mass index is 49.39 kg/m.   General appearance :Awake, alert, not in any distress. Speech Clear. Not toxic Looking HEENT: Atraumatic and Normocephalic Neck: supple, no JVD. No cervical lymphadenopathy. No thyromegaly Resp:Good air entry bilaterally, no added sounds  CVS: S1 S2 regular, no murmurs.  GI: Bowel sounds present, Non tender and not distended with no gaurding, rigidity or rebound.No organomegaly Extremities: B/L Lower Ext shows no edema, both legs are warm to touch Neurology:  speech clear,Non focal, sensation is grossly intact. Psychiatric: Normal judgment and insight. Alert and oriented x 3. Normal mood. Musculoskeletal:No digital cyanosis Skin:No Rash, warm and dry Wounds:N/A  I have personally reviewed following labs and imaging studies  LABORATORY DATA: CBC: Recent Labs  Lab 12/23/18 0943 12/24/18 1253 12/25/18 0342  WBC 10.8* 18.0* 15.5*  NEUTROABS 9.7*  --   --   HGB 11.1* 9.3* 8.3*  HCT 35.8* 30.9* 27.0*  MCV 93.7 94.8 92.5  PLT 223 153 146*    Basic Metabolic Panel: Recent Labs  Lab 12/23/18 0943 12/24/18 1253 12/25/18 0342  NA 138 131* 132*  K 5.0 5.7* 4.5  CL 106 101 103  CO2 21* 18* 19*  GLUCOSE 159* 314* 146*  BUN 36* 43* 41*  CREATININE 2.75* 3.43* 3.28*  CALCIUM 8.9 7.8* 7.5*    GFR: Estimated Creatinine Clearance: 27.2 mL/min (A) (by C-G formula based on SCr of 3.28 mg/dL (H)).  Liver Function Tests: Recent Labs  Lab 12/23/18 0943 12/24/18 1253  AST 69* 145*  ALT 39 133*  ALKPHOS 116 99   BILITOT 0.8 0.8  PROT 8.0 6.1*  ALBUMIN 3.9 2.6*   No results for input(s): LIPASE, AMYLASE in the last 168 hours. No results for input(s): AMMONIA in the last 168 hours.  Coagulation Profile: No results for input(s): INR, PROTIME in the last 168 hours.  Cardiac Enzymes: No results for input(s): CKTOTAL, CKMB, CKMBINDEX, TROPONINI in the last 168 hours.  BNP (last 3 results) No results for input(s): PROBNP in the last 8760 hours.  HbA1C: No results for input(s): HGBA1C in the last 72 hours.  CBG: Recent Labs  Lab 12/24/18 1658 12/24/18 2105 12/24/18 2144 12/25/18 0733  GLUCAP 368* 211* 222* 110*    Lipid Profile: No results for input(s): CHOL, HDL, LDLCALC, TRIG, CHOLHDL, LDLDIRECT in the last 72 hours.  Thyroid Function  Tests: No results for input(s): TSH, T4TOTAL, FREET4, T3FREE, THYROIDAB in the last 72 hours.  Anemia Panel: No results for input(s): VITAMINB12, FOLATE, FERRITIN, TIBC, IRON, RETICCTPCT in the last 72 hours.  Urine analysis:    Component Value Date/Time   COLORURINE YELLOW 12/23/2018 1145   APPEARANCEUR CLEAR 12/23/2018 1145   LABSPEC 1.012 12/23/2018 1145   PHURINE 6.0 12/23/2018 1145   GLUCOSEU NEGATIVE 12/23/2018 1145   HGBUR NEGATIVE 12/23/2018 1145   BILIRUBINUR NEGATIVE 12/23/2018 1145   KETONESUR NEGATIVE 12/23/2018 1145   PROTEINUR 100 (A) 12/23/2018 1145   NITRITE NEGATIVE 12/23/2018 1145   LEUKOCYTESUR TRACE (A) 12/23/2018 1145    Sepsis Labs: Lactic Acid, Venous    Component Value Date/Time   LATICACIDVEN 2.1 ([place]) 12/23/2018 0943    MICROBIOLOGY: Recent Results (from the past 240 hour(s))  Group A Strep by PCR     Status: None   Collection Time: 12/23/18  9:38 AM  Result Value Ref Range Status   Group A Strep by PCR NOT DETECTED NOT DETECTED Final    Comment: Performed at [facility], [place] [address], [place], [place] [address]  Culture, blood (single)     Status: Abnormal (Preliminary result)   Collection Time:  12/23/18  9:40 AM  Result Value Ref Range Status   Specimen Description BLOOD LEFT HAND  Final   Special Requests   Final    BOTTLES DRAWN AEROBIC AND ANAEROBIC Blood Culture adequate volume   Culture  Setup Time   Final    AEROBIC BOTTLE ONLY GRAM POSITIVE COCCI Organism ID to follow CRITICAL RESULT CALLED TO, READ BACK BY AND VERIFIED WITH: K.NEIL,RN AT 4403 ON 12/24/18 BY G.[email or web address] Performed at [facility], [place] [address] [place], [place] [address]    Culture STREPTOCOCCUS GROUP G (A)  Final   Report Status PENDING  Incomplete  Blood Culture ID Panel (Reflexed)     Status: Abnormal   Collection Time: 12/23/18  9:40 AM  Result Value Ref Range Status   Enterococcus species NOT DETECTED NOT DETECTED Final   Listeria monocytogenes NOT DETECTED NOT DETECTED Final   Staphylococcus species NOT DETECTED NOT DETECTED Final   Staphylococcus aureus (BCID) NOT DETECTED NOT DETECTED Final   Streptococcus species DETECTED (A) NOT DETECTED Final    Comment: Not Enterococcus species, Streptococcus agalactiae, Streptococcus pyogenes, or Streptococcus pneumoniae. CRITICAL RESULT CALLED TO, READ BACK BY AND VERIFIED WITH: K.NEIL,RN AT 0247 ON 12/24/18 BY G.MCADOO    Streptococcus agalactiae NOT DETECTED NOT DETECTED Final   Streptococcus pneumoniae NOT DETECTED NOT DETECTED Final   Streptococcus pyogenes NOT DETECTED NOT DETECTED Final   Acinetobacter baumannii NOT DETECTED NOT DETECTED Final   Enterobacteriaceae species NOT DETECTED NOT DETECTED Final   Enterobacter cloacae complex NOT DETECTED NOT DETECTED Final   Escherichia coli NOT DETECTED NOT DETECTED Final   Klebsiella oxytoca NOT DETECTED NOT DETECTED Final   Klebsiella pneumoniae NOT DETECTED NOT DETECTED Final   Proteus species NOT DETECTED NOT DETECTED Final   Serratia marcescens NOT DETECTED NOT DETECTED Final   Haemophilus influenzae NOT DETECTED NOT DETECTED Final   Neisseria meningitidis NOT DETECTED NOT DETECTED Final    Pseudomonas aeruginosa NOT DETECTED NOT DETECTED Final   Candida albicans NOT DETECTED NOT DETECTED Final   Candida glabrata NOT DETECTED NOT DETECTED Final   Candida krusei NOT DETECTED NOT DETECTED Final   Candida parapsilosis NOT DETECTED NOT DETECTED Final   Candida tropicalis NOT DETECTED NOT DETECTED Final  Comment: Performed at [facility], [place] [address] [place], [place] [address]  Blood culture (routine x 2)     Status: None (Preliminary result)   Collection Time: 12/24/18 12:53 PM  Result Value Ref Range Status   Specimen Description BLOOD RIGHT HAND  Final   Special Requests   Final    BOTTLES DRAWN AEROBIC AND ANAEROBIC Blood Culture results may not be optimal due to an inadequate volume of blood received in culture bottles Performed at [facility] [address], [place], [place] [address]    Culture NO GROWTH < 24 HOURS  Final   Report Status PENDING  Incomplete  Blood culture (routine x 2)     Status: None (Preliminary result)   Collection Time: 12/24/18 12:58 PM  Result Value Ref Range Status   Specimen Description BLOOD LEFT HAND  Final   Special Requests   Final    BOTTLES DRAWN AEROBIC AND ANAEROBIC Blood Culture adequate volume Performed at [facility], [place] [address], [place], [place] [address]    Culture NO GROWTH < 24 HOURS  Final   Report Status PENDING  Incomplete    RADIOLOGY STUDIES/RESULTS: Dg Chest 2 View  Result Date: 12/23/2018 CLINICAL DATA:  Fever and chills EXAM: CHEST - 2 VIEW COMPARISON:  February 03, 2017 FINDINGS: The lungs are clear. The heart size and pulmonary vascularity are normal. No adenopathy. There is degenerative change in the lower thoracic spine. IMPRESSION: No edema or consolidation. Electronically Signed   By: Lowella Grip III M.D.   On: 12/23/2018 10:14   Dg Knee 1-2 Views Right  Result Date: 12/02/2018 CLINICAL DATA:  RIGHT knee pain and swelling for 2 weeks post fall EXAM: RIGHT KNEE - 1-2 VIEW  COMPARISON:  12/31/2017 FINDINGS: Osseous demineralization. Tricompartmental osteoarthritic changes with joint space narrowing and spur formation. Mild subchondral cyst formation at medial compartment, which demonstrates the greatest degree of narrowing. No acute fracture, dislocation, or bone destruction. No knee joint effusion. Scattered atherosclerotic calcifications. IMPRESSION: Tricompartmental osteoarthritic changes of the RIGHT knee greatest at medial compartment. Electronically Signed   By: Lavonia Dana M.D.   On: 12/02/2018 16:18   Mr Ankle Left Wo Contrast  Result Date: 12/25/2018 CLINICAL DATA:  Cellulitis with swelling bacteremia of the foot. EXAM: MRI OF THE LEFT ANKLE WITHOUT CONTRAST TECHNIQUE: Multiplanar, multisequence MR imaging of the ankle was performed. No intravenous contrast was administered. COMPARISON:  CT scan 09/16/2018 FINDINGS: TENDONS Peroneal: Thickened common peroneus tendon sheath posterior to the lateral malleolus. Moderate peroneus longus tendinopathy. Posteromedial: Prominent distal tibialis posterior tendinopathy. Ill definition the flexor digitorum longus in the vicinity of the knot of Henry suggesting partial tearing. Anterior: Tendinopathy of the tibialis anterior and extensor hallucis longus. Achilles: Fusiform thickening and accentuated signal in the distal Achilles tendon compatible with mild tendinopathy. Plantar Fascia: Abnormal thickening of the medial band of the plantar fascia with adjacent edema with plantar fasciitis. LIGAMENTS Lateral: The talus is dislocated, and thus the talofibular ligaments are torn. The inferior tibiofibular ligaments are indistinct. The calcaneofibular ligament is indistinct. There is some marrow edema in the calcaneus near the expected attachment site of the calcaneofibular ligament. Medial: The medial malleolus is thought to have fragmented. Amorphous soft tissue in the expected location of the tibiospring and tibionavicular portions of  the deltoid ligament. Obviously the deep tibiotalar portion of the deltoid ligament is completely torn. CARTILAGE Ankle Joint and subtalar joint: Part of the talus, possibly constituting a portion  of the talar dome, is dislocated about 4.5 cm anteriorly from its expected location, and demonstrates regions of low T2 signal possibly from osteonecrosis. Most of the talus S collapsed or necrosis, and in the expected location of the talus there is a 4.3 by 3.0 by 3.3 cm debris-filled cavity. The tibia partially pseudo articulates with the calcaneus, and there is resorption of much of the upper calcaneus as well. Bones: Continuing the list of osseous abnormalities in this case, there is scalloping and thinning of the anterior calcaneus full long within eroded cuboid bone, as well as fragmentation and collapse of the middle and lateral cuneiform spur with severe irregularity, spurring, and erosions along the Lisfranc joint at the second through fifth digits. A bony fragment from the middle cuneiform may have fused in with the medial cuneiform. I favor that most of the findings seen in this case are neuropathic due to Charcot hindfoot and midfoot. If the patient has an occult draining sinus tract in the may be an indicator of a component of infection. Other: Extensive subcutaneous edema along the foot which may be reactive or due to cellulitis. IMPRESSION: 1. Severe Charcot arthropathy of the hindfoot and midfoot, with dislocation of the talus; destruction of much of the talus; resorption/destruction of the middle and lateral cuneiforms; extensive erosion of the anterior calcaneus and portions of the cuboid; extensive erosions along the bases of the metatarsals; interval fragmentation of the medial malleolus; and concomitant ligamentous disruptions. 2. Diffuse subcutaneous edema in the ankle may be reactive or due to cellulitis. 3. Flexor and extensor tendinopathy is noted above. 4. Achilles tendinopathy distally. 5.  Plantar fasciitis. Electronically Signed   By: Van Clines M.D.   On: 12/25/2018 08:27   [name] Limited Joint Space Structures Low Right  Result Date: 12/18/2018 No images saved     LOS: 1 day   Oren Binet, MD  [facility]  If 7PM-7AM, please contact night-coverage  Please page via [email or web address]  Go to [email or web address] and use [place]'s universal password to access. If you do not have the password, please contact the hospital operator.  Locate the [facility] provider you are looking for under [facility] and page to a number that you can be directly reached. If you still have difficulty reaching the provider, please page the [facility] (Director on Call) for the Hospitalists listed on amion for assistance.  12/25/2018, 10:26 AM

## 2018-12-25 NOTE — Consult Note (Signed)
Date of Admission:  12/24/2018          Reason for Consult: Group G streptococcal bacteremia and painful left leg    Referring Provider: Dr. Sloan Leiter   Assessment:  1. Group G streptococcal bacteremia likely from  2. Ulcer overlying medial aspect of her left ankle where she has severe Charcot arthropathy with 3. Severe pain and tenderness throughout her left calf worrisome for pyomyositis 4. Diabetes mellitus 5. Tonic kidney disease 6. CHF  Plan:  1. Continue ceftriaxone for now pending Sensis on her group A strep 2. Obtain MRI tib/fibula 3. Would obtain 2 D echo, organism can cause endocarditis, will consider need for TEE 4. Screen for HCV   Principal Problem:   Bacteremia due to Streptococcus Active Problems:   Charcot ankle, left   Diabetes (HCC)   Essential hypertension   CKD (chronic kidney disease) stage 4, GFR 15-29 ml/min (HCC)   Acute renal failure superimposed on stage 3 chronic kidney disease (HCC)   Cellulitis in diabetic foot (HCC)   Streptococcus infection   Scheduled Meds: . amLODipine  10 mg Oral Daily  . aspirin EC  325 mg Oral Daily  . cloNIDine  0.3 mg Oral BID  . docusate sodium  100 mg Oral BID  . Dulaglutide  1.5 mg Subcutaneous Weekly  . DULoxetine  30 mg Oral Daily  . gabapentin  600 mg Oral QHS  . heparin  5,000 Units Subcutaneous Q8H  . insulin aspart  0-5 Units Subcutaneous QHS  . insulin aspart  0-9 Units Subcutaneous TID WC  . isosorbide mononitrate  120 mg Oral Daily  . pantoprazole  40 mg Oral Daily  . repaglinide  1 mg Oral TID AC   Continuous Infusions: . sodium chloride 125 mL/hr at 12/24/18 1700  . cefTRIAXone (ROCEPHIN)  IV 2 g (12/24/18 2208)   PRN Meds:.acetaminophen, albuterol, traMADol  HPI: Rachel Holmes is a 58 y.o. female kidney disease, congestive heart failure left Charcot arthropathy who goes to dietary wrist at [facility] who is been applying a cast.  Currently she developed a wound over her lateral  malleolus on the left side and began seeing wound care as well.  She was then using a walking cast.  The day prior to admission she developed fevers myalgias body aches and headache and lethargy and came to the ER where influenza PCR was negative other labs were done including a blood culture which has subsequently turned positive for group G Streptococcus.  She was brought back to the ER and in the ER found to have swelling and exquisite tenderness of her left calf.  She has been started on vancomycin and cefepime in the ER.  This is subsequently narrowed to ceftriaxone.  Her MRI of her ankle is unrevealing for osteomyelitis but her left calf is exquisitely tender to palpation on exam.  I am concerned that she may have pyomyositis in this leg.  We need an MRI done today and she very well may need the help of an orthopedic surgeon.  Obtain a 2D echocardiogram and consider transesophageal echocardiogram early in the week given organism is associated with infectious endocarditis at fairly high rate.  Dr. Johnnye Sima will check in on her tomorrow.   Review of Systems: Review of Systems  Constitutional: Positive for chills, diaphoresis, fever and weight loss.  HENT: Negative for congestion and sore throat.   Eyes: Negative for blurred vision and photophobia.  Respiratory: Negative for cough, shortness of breath  and wheezing.   Cardiovascular: Negative for chest pain, palpitations and leg swelling.  Gastrointestinal: Positive for abdominal pain. Negative for blood in stool, constipation, diarrhea, heartburn, melena, nausea and vomiting.  Genitourinary: Negative for dysuria, flank pain and hematuria.  Musculoskeletal: Positive for joint pain and myalgias. Negative for back pain and falls.  Skin: Negative for itching and rash.  Neurological: Positive for dizziness and headaches. Negative for focal weakness, loss of consciousness and weakness.  Endo/Heme/Allergies: Does not bruise/bleed easily.   Psychiatric/Behavioral: Negative for depression and suicidal ideas. The patient does not have insomnia.     Past Medical History:  Diagnosis Date  . Allergic rhinitis   . Anemia   . Anxiety   . Back pain   . Bradycardia   . Breast mass    Patient can no longer palpate specific masses but showed tech general area of concern  . CHF (congestive heart failure) ([place])   . CKD (chronic kidney disease)    STAGE 3  . Constipation   . Diabetes mellitus without complication ([place])   . Dyspnea   . GERD (gastroesophageal reflux disease)   . HLD (hyperlipidemia)   . Hyperparathyroidism ([place])   . Hypertension   . Joint pain   . Leg edema   . Legally blind in left eye, as defined in [place]   . Lymphedema   . Onychomycosis     Social History   Tobacco Use  . Smoking status: Never Smoker  . Smokeless tobacco: Never Used  Substance Use Topics  . Alcohol use: No  . Drug use: Never    Family History  Problem Relation Age of Onset  . Breast cancer Sister 81  . Diabetes Sister   . Diabetes Mother   . Hypertension Mother   . Hyperlipidemia Mother   . Eating disorder Mother   . Obesity Mother    Allergies  Allergen Reactions  . Statins Shortness Of Breath    Wheezing, short of breath  . Codeine Nausea Only  . Ibuprofen Other (See Comments)    Reaction:  Raises pts BP  . Penicillins Hives and Other (See Comments)    Has patient had a PCN reaction causing immediate rash, facial/tongue/throat swelling, SOB or lightheadedness with hypotension: No Has patient had a PCN reaction causing severe rash involving mucus membranes or skin necrosis: No Has patient had a PCN reaction that required hospitalization No Has patient had a PCN reaction occurring within the last 10 years: No If all of the above answers are "NO", then may proceed with Cephalosporin use.  [name] Percocet [Oxycodone-Acetaminophen] Nausea Only  . Tramadol Nausea Only    Can take if she has eaten  . Vicodin  [Hydrocodone-Acetaminophen] Nausea Only    OBJECTIVE: Blood pressure (!) 164/75, pulse 89, temperature 100.2 F (37.9 C), temperature source Oral, resp. rate 17, height 5\' 6"  (1.676 m), weight (!) 138.8 kg, SpO2 97 %.  Physical Exam Constitutional:      General: She is not in acute distress.    Appearance: Normal appearance. She is well-developed. She is not ill-appearing or diaphoretic.  HENT:     Head: Normocephalic and atraumatic.     Right Ear: Hearing and external ear normal.     Left Ear: Hearing and external ear normal.     Nose: No nasal deformity or rhinorrhea.     Mouth/Throat:     Mouth: Mucous membranes are moist.     Pharynx: No oropharyngeal exudate or posterior oropharyngeal erythema.  Eyes:  General: No scleral icterus.    Conjunctiva/sclera: Conjunctivae normal.     Right eye: Right conjunctiva is not injected.     Left eye: Left conjunctiva is not injected.  Neck:     Musculoskeletal: Normal range of motion and neck supple.     Vascular: No JVD.  Cardiovascular:     Rate and Rhythm: Normal rate and regular rhythm.     Heart sounds: Normal heart sounds, S1 normal and S2 normal. No murmur. No friction rub. No gallop.   Pulmonary:     Effort: Pulmonary effort is normal. No respiratory distress.     Breath sounds: Normal breath sounds. No wheezing or rhonchi.  Abdominal:     General: Abdomen is flat. Bowel sounds are normal. There is no distension.     Palpations: Abdomen is soft. There is no mass.     Tenderness: There is no abdominal tenderness.     Hernia: No hernia is present.  Musculoskeletal: Normal range of motion.        General: Swelling present.     Right shoulder: Normal.     Left shoulder: Normal.     Right hip: Normal.     Left hip: Normal.     Right knee: Normal.     Left knee: Normal.  Lymphadenopathy:     Head:     Right side of head: No submandibular, preauricular or posterior auricular adenopathy.     Left side of head: No  submandibular, preauricular or posterior auricular adenopathy.     Cervical: No cervical adenopathy.     Right cervical: No superficial or deep cervical adenopathy.    Left cervical: No superficial or deep cervical adenopathy.  Skin:    General: Skin is warm and dry.     Coloration: Skin is not pale.     Findings: No abrasion, bruising, ecchymosis, erythema, lesion or rash.     Nails: There is no clubbing.   Neurological:     General: No focal deficit present.     Mental Status: She is alert and oriented to person, place, and time.     Sensory: No sensory deficit.     Coordination: Coordination normal.  Psychiatric:        Attention and Perception: She is attentive.        Mood and Affect: Mood normal.        Speech: Speech normal.        Behavior: Behavior normal. Behavior is cooperative.        Thought Content: Thought content normal.        Judgment: Judgment normal.    Left leg is grossly tender to palpation around the calf pictured 12/25/2018:      Wound about lateral malleolus 12/25/2018:    Lab Results Lab Results  Component Value Date   WBC 15.5 (H) 12/25/2018   HGB 8.3 (L) 12/25/2018   HCT 27.0 (L) 12/25/2018   MCV 92.5 12/25/2018   PLT 146 (L) 12/25/2018    Lab Results  Component Value Date   CREATININE 3.28 (H) 12/25/2018   BUN 41 (H) 12/25/2018   NA 132 (L) 12/25/2018   K 4.5 12/25/2018   CL 103 12/25/2018   CO2 19 (L) 12/25/2018    Lab Results  Component Value Date   ALT 133 (H) 12/24/2018   AST 145 (H) 12/24/2018   ALKPHOS 99 12/24/2018   BILITOT 0.8 12/24/2018     Microbiology: Recent Results (from the past 240 hour(s))  Group A Strep by PCR     Status: None   Collection Time: 12/23/18  9:38 AM  Result Value Ref Range Status   Group A Strep by PCR NOT DETECTED NOT DETECTED Final    Comment: Performed at [facility], [address], [place], [place] [address]  Culture, blood (single)     Status: Abnormal (Preliminary result)    Collection Time: 12/23/18  9:40 AM  Result Value Ref Range Status   Specimen Description BLOOD LEFT HAND  Final   Special Requests   Final    BOTTLES DRAWN AEROBIC AND ANAEROBIC Blood Culture adequate volume   Culture  Setup Time   Final    AEROBIC BOTTLE ONLY GRAM POSITIVE COCCI Organism ID to follow CRITICAL RESULT CALLED TO, READ BACK BY AND VERIFIED WITH: K.NEIL,RN AT 6387 ON 12/24/18 BY [email or web address] Performed at [facility], [place] [address], [place], [place] [address]    Culture STREPTOCOCCUS GROUP G (A)  Final   Report Status PENDING  Incomplete  Blood Culture ID Panel (Reflexed)     Status: Abnormal   Collection Time: 12/23/18  9:40 AM  Result Value Ref Range Status   Enterococcus species NOT DETECTED NOT DETECTED Final   Listeria monocytogenes NOT DETECTED NOT DETECTED Final   Staphylococcus species NOT DETECTED NOT DETECTED Final   Staphylococcus aureus (BCID) NOT DETECTED NOT DETECTED Final   Streptococcus species DETECTED (A) NOT DETECTED Final    Comment: Not Enterococcus species, Streptococcus agalactiae, Streptococcus pyogenes, or Streptococcus pneumoniae. CRITICAL RESULT CALLED TO, READ BACK BY AND VERIFIED WITH: K.NEIL,RN AT 0247 ON 12/24/18 BY [email or web address]    Streptococcus agalactiae NOT DETECTED NOT DETECTED Final   Streptococcus pneumoniae NOT DETECTED NOT DETECTED Final   Streptococcus pyogenes NOT DETECTED NOT DETECTED Final   Acinetobacter baumannii NOT DETECTED NOT DETECTED Final   Enterobacteriaceae species NOT DETECTED NOT DETECTED Final   Enterobacter cloacae complex NOT DETECTED NOT DETECTED Final   Escherichia coli NOT DETECTED NOT DETECTED Final   Klebsiella oxytoca NOT DETECTED NOT DETECTED Final   Klebsiella pneumoniae NOT DETECTED NOT DETECTED Final   Proteus species NOT DETECTED NOT DETECTED Final   Serratia marcescens NOT DETECTED NOT DETECTED Final   Haemophilus influenzae NOT DETECTED NOT DETECTED Final   Neisseria meningitidis NOT DETECTED NOT  DETECTED Final   Pseudomonas aeruginosa NOT DETECTED NOT DETECTED Final   Candida albicans NOT DETECTED NOT DETECTED Final   Candida glabrata NOT DETECTED NOT DETECTED Final   Candida krusei NOT DETECTED NOT DETECTED Final   Candida parapsilosis NOT DETECTED NOT DETECTED Final   Candida tropicalis NOT DETECTED NOT DETECTED Final    Comment: Performed at [facility], [place]. [address], [place], [place] [address]  Blood culture (routine x 2)     Status: None (Preliminary result)   Collection Time: 12/24/18 12:53 PM  Result Value Ref Range Status   Specimen Description BLOOD RIGHT HAND  Final   Special Requests   Final    BOTTLES DRAWN AEROBIC AND ANAEROBIC Blood Culture results may not be optimal due to an inadequate volume of blood received in culture bottles Performed at [place] [address] [place], [place] [address]    Culture NO GROWTH < 24 HOURS  Final   Report Status PENDING  Incomplete  Blood culture (routine x 2)     Status: None (Preliminary result)   Collection Time: 12/24/18 12:58 PM  Result Value Ref Range Status  Specimen Description BLOOD LEFT HAND  Final   Special Requests   Final    BOTTLES DRAWN AEROBIC AND ANAEROBIC Blood Culture adequate volume Performed at [facility], [place] [address], [place], [place] [address]    Culture NO GROWTH < 24 HOURS  Final   Report Status PENDING  Incomplete    Alcide Evener, [facility] [phone] pager  12/25/2018, 10:22 AM

## 2018-12-25 NOTE — Progress Notes (Signed)
2D Echocardiogram has been performed.  Rachel Holmes 12/25/2018, 2:16 PM

## 2018-12-26 ENCOUNTER — Inpatient Hospital Stay (HOSPITAL_COMMUNITY): Payer: Medicare HMO

## 2018-12-26 DIAGNOSIS — I10 Essential (primary) hypertension: Secondary | ICD-10-CM

## 2018-12-26 LAB — CBC
HCT: 28.3 % — ABNORMAL LOW (ref 36.0–46.0)
Hemoglobin: 8.8 g/dL — ABNORMAL LOW (ref 12.0–15.0)
MCH: 28.9 pg (ref 26.0–34.0)
MCHC: 31.1 g/dL (ref 30.0–36.0)
MCV: 93.1 fL (ref 80.0–100.0)
Platelets: 149 10*3/uL — ABNORMAL LOW (ref 150–400)
RBC: 3.04 MIL/uL — ABNORMAL LOW (ref 3.87–5.11)
RDW: 13.4 % (ref 11.5–15.5)
WBC: 11 10*3/uL — ABNORMAL HIGH (ref 4.0–10.5)
nRBC: 0.2 % (ref 0.0–0.2)

## 2018-12-26 LAB — GLUCOSE, CAPILLARY
Glucose-Capillary: 131 mg/dL — ABNORMAL HIGH (ref 70–99)
Glucose-Capillary: 135 mg/dL — ABNORMAL HIGH (ref 70–99)
Glucose-Capillary: 171 mg/dL — ABNORMAL HIGH (ref 70–99)
Glucose-Capillary: 182 mg/dL — ABNORMAL HIGH (ref 70–99)
Glucose-Capillary: 205 mg/dL — ABNORMAL HIGH (ref 70–99)

## 2018-12-26 LAB — BASIC METABOLIC PANEL
Anion gap: 11 (ref 5–15)
BUN: 37 mg/dL — ABNORMAL HIGH (ref 6–20)
CO2: 20 mmol/L — ABNORMAL LOW (ref 22–32)
Calcium: 8.2 mg/dL — ABNORMAL LOW (ref 8.9–10.3)
Chloride: 104 mmol/L (ref 98–111)
Creatinine, Ser: 3.24 mg/dL — ABNORMAL HIGH (ref 0.44–1.00)
GFR calc Af Amer: 17 mL/min — ABNORMAL LOW (ref >60)
GFR calc non Af Amer: 15 mL/min — ABNORMAL LOW (ref >60)
Glucose, Bld: 137 mg/dL — ABNORMAL HIGH (ref 70–99)
Potassium: 4.6 mmol/L (ref 3.5–5.1)
Sodium: 135 mmol/L (ref 135–145)

## 2018-12-26 LAB — URINE CULTURE: Culture: NO GROWTH

## 2018-12-26 LAB — SEDIMENTATION RATE: Sed Rate: 102 mm/hr — ABNORMAL HIGH (ref 0–22)

## 2018-12-26 LAB — C-REACTIVE PROTEIN: CRP: 29.7 mg/dL — ABNORMAL HIGH (ref <1.0)

## 2018-12-26 MED ORDER — ACETAMINOPHEN 325 MG PO TABS
650.0000 mg | ORAL_TABLET | Freq: Four times a day (QID) | ORAL | Status: DC | PRN
  Administered 2018-12-26 – 2018-12-30 (×7): 650 mg via ORAL
  Filled 2018-12-26 (×7): qty 2

## 2018-12-26 NOTE — Progress Notes (Addendum)
PROGRESS NOTE        PATIENT DETAILS Name: Rachel Holmes Age: 58 y.o. Sex: female Date of Birth: 12-14-1960 Admit Date: 12/24/2018 Admitting Physician Barb Merino, MD JAS:[id number], Rubbie Battiest, MD  Brief Narrative: Patient is a 58 y.o. female-2, hypertension, peripheral neuropathy, Charcot joint involving left  foot presenting with 3-4-day history of fever, myalgias, left leg swelling-evaluated in the ED on 2/19, blood cultures drawn and subsequently sent home.  Brought back to the ED after blood cultures were positive for Streptococcus.  See below for further details.  Subjective: Fever curve better but still with low-grade fever this morning.  Left leg swelling erythema has improved.  Complains of feeling feverish this morning when I was in the room.  Assessment/Plan: Sepsis secondary to group G streptococcal bacteremia and left leg soft tissue infection: Sepsis physiology is slowly improving, fever curve better but still with low-grade fever this morning.  Left leg with decreased swelling and erythema today, compartments remain soft.  MRI of the left leg without any obvious abscess.  Leukocytosis slowly improving.  Repeat blood cultures on 2/20- so far.  TTE results pending, scheduled for TEE on 2/24.  Remains on IV Rocephin-we will await further recommendations from ID.  AKI on CKD stage IV: Dmitriy mediated-slowly downtrending-continue to avoid nephrotoxic agents.  Continue to follow electrolytes closely.  Check renal ultrasound to make sure no hydronephrosis.  Chronic diastolic heart failure: Volume status is stable-watch closely while patient is on IV fluids.    Hypertension: Blood pressure to be stable, continue manidipine, clonidine and Imdur.    DM-2: CBG stable-continue moderate scale SSI, Prandin and Trulicity.   Left leg ulceration: Present prior to admission-continue wound care per wound care team.  History of Charcot arthropathy  DVT  Prophylaxis: Prophylactic Heparin   Code Status: Full code   Family Communication: None at bedside  Disposition Plan: Remain inpatient-we will remain inpatient until TEE can be completed so we can determine duration of IV antimicrobial therapy.  Antimicrobial agents: Anti-infectives (From admission, onward)   Start     Dose/Rate Route Frequency Ordered Stop   12/25/18 1400  ceFEPIme (MAXIPIME) 1 g in sodium chloride 0.9 % 100 mL IVPB  Status:  Discontinued     1 g 200 mL/hr over 30 Minutes Intravenous Every 24 hours 12/24/18 1529 12/24/18 1551   12/24/18 1600  cefTRIAXone (ROCEPHIN) 2 g in sodium chloride 0.9 % 100 mL IVPB     2 g 200 mL/hr over 30 Minutes Intravenous Every 24 hours 12/24/18 1551     12/24/18 1400  ceFEPIme (MAXIPIME) 1 g in sodium chloride 0.9 % 100 mL IVPB     1 g 200 mL/hr over 30 Minutes Intravenous  Once 12/24/18 1254 12/24/18 1613   12/24/18 1400  vancomycin (VANCOCIN) 2,500 mg in sodium chloride 0.9 % 500 mL IVPB     2,500 mg 250 mL/hr over 120 Minutes Intravenous  Once 12/24/18 1311 12/24/18 2007      Procedures: None  CONSULTS:  ID  Time spent: 25- minutes-Greater than 50% of this time was spent in counseling, explanation of diagnosis, planning of further management, and coordination of care.  MEDICATIONS: Scheduled Meds: . amLODipine  10 mg Oral Daily  . aspirin EC  325 mg Oral Daily  . cloNIDine  0.3 mg Oral BID  . docusate sodium  100  mg Oral BID  . [START ON 12/27/2018] Dulaglutide  1.5 mg Subcutaneous Q Sun  . DULoxetine  30 mg Oral Daily  . gabapentin  600 mg Oral QHS  . heparin  5,000 Units Subcutaneous Q8H  . insulin aspart  0-15 Units Subcutaneous TID WC  . insulin aspart  0-5 Units Subcutaneous QHS  . isosorbide mononitrate  120 mg Oral Daily  . pantoprazole  40 mg Oral Daily  . repaglinide  1 mg Oral TID AC   Continuous Infusions: . sodium chloride 75 mL/hr at 12/26/18 0604  . cefTRIAXone (ROCEPHIN)  IV 2 g (12/25/18 2143)    PRN Meds:.acetaminophen, albuterol, traMADol   PHYSICAL EXAM: Vital signs: Vitals:   12/25/18 1138 12/25/18 2135 12/26/18 0533 12/26/18 0537  BP: (!) 143/68 (!) 158/68 (!) 167/73 (!) 148/57  Pulse: 81 82 98 97  Resp: 18 18 15    Temp: 98.3 F (36.8 C) 98.4 F (36.9 C) (!) 100.5 F (38.1 C)   TempSrc: Oral Oral    SpO2: 97% 98% 95% 95%  Weight:      Height:       Filed Weights   12/24/18 1500  Weight: (!) 138.8 kg   Body mass index is 49.39 kg/m.   General appearance:Awake, alert, not in any distress.  Eyes:no scleral icterus. HEENT: Atraumatic and Normocephalic Neck: supple, no JVD. Resp:Good air entry bilaterally,no rales or rhonchi CVS: S1 S2 regular, no murmurs.  GI: Bowel sounds present, Non tender and not distended with no gaurding, rigidity or rebound. Extremities: Left leg with diffuse swelling but compartments are soft-erythema and swelling have reduced compared to yesterday. Neurology:  Non focal Psychiatric: Normal judgment and insight. Normal mood. Musculoskeletal:No digital cyanosis Skin:No Rash, warm and dry Wounds:N/A  I have personally reviewed following labs and imaging studies  LABORATORY DATA: CBC: Recent Labs  Lab 12/23/18 0943 12/24/18 1253 12/25/18 0342 12/26/18 0446  WBC 10.8* 18.0* 15.5* 11.0*  NEUTROABS 9.7*  --   --   --   HGB 11.1* 9.3* 8.3* 8.8*  HCT 35.8* 30.9* 27.0* 28.3*  MCV 93.7 94.8 92.5 93.1  PLT 223 153 146* 149*    Basic Metabolic Panel: Recent Labs  Lab 12/23/18 0943 12/24/18 1253 12/25/18 0342 12/26/18 0446  NA 138 131* 132* 135  K 5.0 5.7* 4.5 4.6  CL 106 101 103 104  CO2 21* 18* 19* 20*  GLUCOSE 159* 314* 146* 137*  BUN 36* 43* 41* 37*  CREATININE 2.75* 3.43* 3.28* 3.24*  CALCIUM 8.9 7.8* 7.5* 8.2*    GFR: Estimated Creatinine Clearance: 27.6 mL/min (A) (by C-G formula based on SCr of 3.24 mg/dL (H)).  Liver Function Tests: Recent Labs  Lab 12/23/18 0943 12/24/18 1253  AST 69* 145*  ALT 39  133*  ALKPHOS 116 99  BILITOT 0.8 0.8  PROT 8.0 6.1*  ALBUMIN 3.9 2.6*   No results for input(s): LIPASE, AMYLASE in the last 168 hours. No results for input(s): AMMONIA in the last 168 hours.  Coagulation Profile: No results for input(s): INR, PROTIME in the last 168 hours.  Cardiac Enzymes: No results for input(s): CKTOTAL, CKMB, CKMBINDEX, TROPONINI in the last 168 hours.  BNP (last 3 results) No results for input(s): PROBNP in the last 8760 hours.  HbA1C: No results for input(s): HGBA1C in the last 72 hours.  CBG: Recent Labs  Lab 12/25/18 1138 12/25/18 1706 12/25/18 2122 12/26/18 0714 12/26/18 0757  GLUCAP 126* 142* 122* 131* 135*    Lipid Profile: No  results for input(s): CHOL, HDL, LDLCALC, TRIG, CHOLHDL, LDLDIRECT in the last 72 hours.  Thyroid Function Tests: No results for input(s): TSH, T4TOTAL, FREET4, T3FREE, THYROIDAB in the last 72 hours.  Anemia Panel: No results for input(s): VITAMINB12, FOLATE, FERRITIN, TIBC, IRON, RETICCTPCT in the last 72 hours.  Urine analysis:    Component Value Date/Time   COLORURINE YELLOW 12/23/2018 1145   APPEARANCEUR CLEAR 12/23/2018 1145   LABSPEC 1.012 12/23/2018 1145   PHURINE 6.0 12/23/2018 1145   GLUCOSEU NEGATIVE 12/23/2018 1145   HGBUR NEGATIVE 12/23/2018 1145   BILIRUBINUR NEGATIVE 12/23/2018 1145   KETONESUR NEGATIVE 12/23/2018 1145   PROTEINUR 100 (A) 12/23/2018 1145   NITRITE NEGATIVE 12/23/2018 1145   LEUKOCYTESUR TRACE (A) 12/23/2018 1145    Sepsis Labs: Lactic Acid, Venous    Component Value Date/Time   LATICACIDVEN 2.1 ([place]) 12/23/2018 0943    MICROBIOLOGY: Recent Results (from the past 240 hour(s))  Group A Strep by PCR     Status: None   Collection Time: 12/23/18  9:38 AM  Result Value Ref Range Status   Group A Strep by PCR NOT DETECTED NOT DETECTED Final    Comment: Performed at [facility], [place] [address], [place], [place] [address]  Culture, blood (single)     Status:  Abnormal (Preliminary result)   Collection Time: 12/23/18  9:40 AM  Result Value Ref Range Status   Specimen Description BLOOD LEFT HAND  Final   Special Requests   Final    BOTTLES DRAWN AEROBIC AND ANAEROBIC Blood Culture adequate volume   Culture  Setup Time   Final    AEROBIC BOTTLE ONLY GRAM POSITIVE COCCI Organism ID to follow CRITICAL RESULT CALLED TO, READ BACK BY AND VERIFIED WITH: K.NEIL,RN AT 0102 ON 12/24/18 BY G.[email or web address] Performed at [facility], [place] [address], [place], [place] [address]    Culture STREPTOCOCCUS GROUP G REPEATING SENSITIVITY  (A)  Final   Report Status PENDING  Incomplete  Blood Culture ID Panel (Reflexed)     Status: Abnormal   Collection Time: 12/23/18  9:40 AM  Result Value Ref Range Status   Enterococcus species NOT DETECTED NOT DETECTED Final   Listeria monocytogenes NOT DETECTED NOT DETECTED Final   Staphylococcus species NOT DETECTED NOT DETECTED Final   Staphylococcus aureus (BCID) NOT DETECTED NOT DETECTED Final   Streptococcus species DETECTED (A) NOT DETECTED Final    Comment: Not Enterococcus species, Streptococcus agalactiae, Streptococcus pyogenes, or Streptococcus pneumoniae. CRITICAL RESULT CALLED TO, READ BACK BY AND VERIFIED WITH: K.NEIL,RN AT 0247 ON 12/24/18 BY G.MCADOO    Streptococcus agalactiae NOT DETECTED NOT DETECTED Final   Streptococcus pneumoniae NOT DETECTED NOT DETECTED Final   Streptococcus pyogenes NOT DETECTED NOT DETECTED Final   Acinetobacter baumannii NOT DETECTED NOT DETECTED Final   Enterobacteriaceae species NOT DETECTED NOT DETECTED Final   Enterobacter cloacae complex NOT DETECTED NOT DETECTED Final   Escherichia coli NOT DETECTED NOT DETECTED Final   Klebsiella oxytoca NOT DETECTED NOT DETECTED Final   Klebsiella pneumoniae NOT DETECTED NOT DETECTED Final   Proteus species NOT DETECTED NOT DETECTED Final   Serratia marcescens NOT DETECTED NOT DETECTED Final   Haemophilus influenzae NOT DETECTED NOT  DETECTED Final   Neisseria meningitidis NOT DETECTED NOT DETECTED Final   Pseudomonas aeruginosa NOT DETECTED NOT DETECTED Final   Candida albicans NOT DETECTED NOT DETECTED Final   Candida glabrata NOT DETECTED NOT DETECTED Final   Candida krusei NOT DETECTED NOT DETECTED Final  Candida parapsilosis NOT DETECTED NOT DETECTED Final   Candida tropicalis NOT DETECTED NOT DETECTED Final    Comment: Performed at [facility], [place] [address], [place], [place] [address]  Blood culture (routine x 2)     Status: None (Preliminary result)   Collection Time: 12/24/18 12:53 PM  Result Value Ref Range Status   Specimen Description BLOOD RIGHT HAND  Final   Special Requests   Final    BOTTLES DRAWN AEROBIC AND ANAEROBIC Blood Culture results may not be optimal due to an inadequate volume of blood received in culture bottles Performed at [facility] [address] [place], [place] [address]    Culture NO GROWTH 1 DAY  Final   Report Status PENDING  Incomplete  Blood culture (routine x 2)     Status: None (Preliminary result)   Collection Time: 12/24/18 12:58 PM  Result Value Ref Range Status   Specimen Description BLOOD LEFT HAND  Final   Special Requests   Final    BOTTLES DRAWN AEROBIC AND ANAEROBIC Blood Culture adequate volume Performed at [facility], [place] [address], [place], [place] [address]    Culture NO GROWTH 1 DAY  Final   Report Status PENDING  Incomplete    RADIOLOGY STUDIES/RESULTS: Dg Chest 2 View  Result Date: 12/23/2018 CLINICAL DATA:  Fever and chills EXAM: CHEST - 2 VIEW COMPARISON:  February 03, 2017 FINDINGS: The lungs are clear. The heart size and pulmonary vascularity are normal. No adenopathy. There is degenerative change in the lower thoracic spine. IMPRESSION: No edema or consolidation. Electronically Signed   By: Lowella Grip III M.D.   On: 12/23/2018 10:14   Dg Knee 1-2 Views Right  Result Date: 12/02/2018 CLINICAL DATA:  RIGHT knee pain and  swelling for 2 weeks post fall EXAM: RIGHT KNEE - 1-2 VIEW COMPARISON:  12/31/2017 FINDINGS: Osseous demineralization. Tricompartmental osteoarthritic changes with joint space narrowing and spur formation. Mild subchondral cyst formation at medial compartment, which demonstrates the greatest degree of narrowing. No acute fracture, dislocation, or bone destruction. No knee joint effusion. Scattered atherosclerotic calcifications. IMPRESSION: Tricompartmental osteoarthritic changes of the RIGHT knee greatest at medial compartment. Electronically Signed   By: Lavonia Dana M.D.   On: 12/02/2018 16:18   Mr Tibia Fibula Left Wo Contrast  Result Date: 12/25/2018 CLINICAL DATA:  Diabetic patient with left lower leg pain and swelling. Chronic wound over the lateral malleolus of the left ankle. EXAM: MRI OF LOWER LEFT EXTREMITY WITHOUT CONTRAST TECHNIQUE: Multiplanar, multisequence MR imaging of the left lower leg was performed. No intravenous contrast was administered. COMPARISON:  None. FINDINGS: Bones/Joint/Cartilage Marrow edema in the distal 5 cm of the tibia and fibula is identified as seen on the patient's ankle MRI yesterday. Marrow signal is otherwise normal. Ligaments Negative. Muscles and Tendons There is mild fatty atrophy of intrinsic musculature of the lower leg. Intermediate increased T2 signal in all lower leg musculature is compatible with diabetic myopathy/denervation. A small amount of fluid is seen interposed between the soleus and medial gastrocnemius. No intramuscular fluid collection. No muscle tear or strain. Soft tissues Intense subcutaneous edema about the lower legs bilaterally appears worse on the left. IMPRESSION: Marrow edema in the distal 5 cm of the tibia and fibula as seen on MRI yesterday could be secondary to stress change or osteomyelitis. Marrow signal is otherwise normal. Intense subcutaneous edema in the lower legs bilaterally is worse on the left and compatible with dependent change  and/or cellulitis. Small volume of fluid interposed between the medial head of gastrocnemius and soleus could be due to dependent change or infection. Intermediate increased T2 signal in all lower leg musculature is most consistent with diabetic myopathy and denervation. No intramuscular fluid collection. Electronically Signed   By: Inge Rise M.D.   On: 12/25/2018 12:03   Mr Ankle Left Wo Contrast  Result Date: 12/25/2018 CLINICAL DATA:  Cellulitis with swelling bacteremia of the foot. EXAM: MRI OF THE LEFT ANKLE WITHOUT CONTRAST TECHNIQUE: Multiplanar, multisequence MR imaging of the ankle was performed. No intravenous contrast was administered. COMPARISON:  CT scan 09/16/2018 FINDINGS: TENDONS Peroneal: Thickened common peroneus tendon sheath posterior to the lateral malleolus. Moderate peroneus longus tendinopathy. Posteromedial: Prominent distal tibialis posterior tendinopathy. Ill definition the flexor digitorum longus in the vicinity of the knot of Henry suggesting partial tearing. Anterior: Tendinopathy of the tibialis anterior and extensor hallucis longus. Achilles: Fusiform thickening and accentuated signal in the distal Achilles tendon compatible with mild tendinopathy. Plantar Fascia: Abnormal thickening of the medial band of the plantar fascia with adjacent edema with plantar fasciitis. LIGAMENTS Lateral: The talus is dislocated, and thus the talofibular ligaments are torn. The inferior tibiofibular ligaments are indistinct. The calcaneofibular ligament is indistinct. There is some marrow edema in the calcaneus near the expected attachment site of the calcaneofibular ligament. Medial: The medial malleolus is thought to have fragmented. Amorphous soft tissue in the expected location of the tibiospring and tibionavicular portions of the deltoid ligament. Obviously the deep tibiotalar portion of the deltoid ligament is completely torn. CARTILAGE Ankle Joint and subtalar joint: Part of the talus,  possibly constituting a portion of the talar dome, is dislocated about 4.5 cm anteriorly from its expected location, and demonstrates regions of low T2 signal possibly from osteonecrosis. Most of the talus S collapsed or necrosis, and in the expected location of the talus there is a 4.3 by 3.0 by 3.3 cm debris-filled cavity. The tibia partially pseudo articulates with the calcaneus, and there is resorption of much of the upper calcaneus as well. Bones: Continuing the list of osseous abnormalities in this case, there is scalloping and thinning of the anterior calcaneus full long within eroded cuboid bone, as well as fragmentation and collapse of the middle and lateral cuneiform spur with severe irregularity, spurring, and erosions along the Lisfranc joint at the second through fifth digits. A bony fragment from the middle cuneiform may have fused in with the medial cuneiform. I favor that most of the findings seen in this case are neuropathic due to Charcot hindfoot and midfoot. If the patient has an occult draining sinus tract in the may be an indicator of a component of infection. Other: Extensive subcutaneous edema along the foot which may be reactive or due to cellulitis. IMPRESSION: 1. Severe Charcot arthropathy of the hindfoot and midfoot, with dislocation of the talus; destruction of much of the talus; resorption/destruction of the middle and lateral cuneiforms; extensive erosion of the anterior calcaneus and portions of the cuboid; extensive erosions along the bases of the metatarsals; interval fragmentation of the medial malleolus; and concomitant ligamentous disruptions. 2. Diffuse subcutaneous edema in the ankle may be reactive or due to cellulitis. 3. Flexor and extensor tendinopathy is noted above. 4. Achilles tendinopathy distally. 5. Plantar fasciitis. Electronically Signed   By: Van Clines M.D.   On: 12/25/2018 08:27   [name] Limited Joint Space Structures Low Right  Result Date: 12/18/2018 No  images saved   Isabel [name] Lower  Extremity Venous (dvt)  Result Date: 12/25/2018  Lower Venous Study Indications: Swelling.  Limitations: Body habitus. Performing Technologist: Antonieta Pert RDMS, RVT  Examination Guidelines: A complete evaluation includes B-mode imaging, spectral Doppler, color Doppler, and power Doppler as needed of all accessible portions of each vessel. Bilateral testing is considered an integral part of a complete examination. Limited examinations for reoccurring indications may be performed as noted.  Right Venous Findings: +---+---------------+---------+-----------+----------+-------+    CompressibilityPhasicitySpontaneityPropertiesSummary +---+---------------+---------+-----------+----------+-------+ CFVFull           Yes      Yes                          +---+---------------+---------+-----------+----------+-------+  Left Venous Findings: +---------+---------------+---------+-----------+----------+--------------+          CompressibilityPhasicitySpontaneityPropertiesSummary        +---------+---------------+---------+-----------+----------+--------------+ CFV      Full           Yes      Yes                                 +---------+---------------+---------+-----------+----------+--------------+ SFJ      Full                                                        +---------+---------------+---------+-----------+----------+--------------+ FV Prox  Full                                                        +---------+---------------+---------+-----------+----------+--------------+ FV Mid   Full                                                        +---------+---------------+---------+-----------+----------+--------------+ FV DistalFull                                                        +---------+---------------+---------+-----------+----------+--------------+ PFV      Full                                                         +---------+---------------+---------+-----------+----------+--------------+ POP      Full           Yes      Yes                                 +---------+---------------+---------+-----------+----------+--------------+ PTV  Not visualized +---------+---------------+---------+-----------+----------+--------------+ PERO                                                  Not visualized +---------+---------------+---------+-----------+----------+--------------+ GSV      Full                                                        +---------+---------------+---------+-----------+----------+--------------+    Summary: Right: No evidence of common femoral vein obstruction. Left: There is no evidence of deep vein thrombosis in the lower extremity. However, portions of this examination were limited- see technologist comments above. No cystic structure found in the popliteal fossa. Prominent lymph nodes noted, largest measuring 3.2cm  *See table(s) above for measurements and observations. Electronically signed by Deitra Mayo MD on 12/25/2018 at 2:35:28 PM.    Final      LOS: 2 days   Oren Binet, MD  [facility]  If 7PM-7AM, please contact night-coverage  Please page via [email or web address]  Go to [email or web address] and use [facility] universal password to access. If you do not have the password, please contact the hospital operator.  Locate the [facility] provider you are looking for under [facility] and page to a number that you can be directly reached. If you still have difficulty reaching the provider, please page the [facility] (Director on Call) for the Hospitalists listed on amion for assistance.  12/26/2018, 11:04 AM

## 2018-12-26 NOTE — Progress Notes (Signed)
INFECTIOUS DISEASE PROGRESS NOTE  ID: Rachel Holmes is a 58 y.o. female with  Principal Problem:   Bacteremia due to Streptococcus Active Problems:   Charcot ankle, left   Diabetes (HCC)   Essential hypertension   CKD (chronic kidney disease) stage 4, GFR 15-29 ml/min (HCC)   Acute renal failure superimposed on stage 3 chronic kidney disease (HCC)   Cellulitis in diabetic foot (HCC)   Streptococcus infection  Subjective: No complaints  Abtx:  Anti-infectives (From admission, onward)   Start     Dose/Rate Route Frequency Ordered Stop   12/25/18 1400  ceFEPIme (MAXIPIME) 1 g in sodium chloride 0.9 % 100 mL IVPB  Status:  Discontinued     1 g 200 mL/hr over 30 Minutes Intravenous Every 24 hours 12/24/18 1529 12/24/18 1551   12/24/18 1600  cefTRIAXone (ROCEPHIN) 2 g in sodium chloride 0.9 % 100 mL IVPB     2 g 200 mL/hr over 30 Minutes Intravenous Every 24 hours 12/24/18 1551     12/24/18 1400  ceFEPIme (MAXIPIME) 1 g in sodium chloride 0.9 % 100 mL IVPB     1 g 200 mL/hr over 30 Minutes Intravenous  Once 12/24/18 1254 12/24/18 1613   12/24/18 1400  vancomycin (VANCOCIN) 2,500 mg in sodium chloride 0.9 % 500 mL IVPB     2,500 mg 250 mL/hr over 120 Minutes Intravenous  Once 12/24/18 1311 12/24/18 2007      Medications:  Scheduled: . amLODipine  10 mg Oral Daily  . aspirin EC  325 mg Oral Daily  . cloNIDine  0.3 mg Oral BID  . docusate sodium  100 mg Oral BID  . [START ON 12/27/2018] Dulaglutide  1.5 mg Subcutaneous Q Sun  . DULoxetine  30 mg Oral Daily  . gabapentin  600 mg Oral QHS  . heparin  5,000 Units Subcutaneous Q8H  . insulin aspart  0-15 Units Subcutaneous TID WC  . insulin aspart  0-5 Units Subcutaneous QHS  . isosorbide mononitrate  120 mg Oral Daily  . pantoprazole  40 mg Oral Daily  . repaglinide  1 mg Oral TID AC    Objective: Vital signs in last 24 hours: Temp:  [98.4 F (36.9 C)-100.5 F (38.1 C)] 100.5 F (38.1 C) (02/22 0533) Pulse Rate:   [82-98] 97 (02/22 0537) Resp:  [15-18] 15 (02/22 0533) BP: (148-167)/(57-73) 148/57 (02/22 0537) SpO2:  [95 %-98 %] 95 % (02/22 0537)   General appearance: alert, cooperative and no distress Resp: clear to auscultation bilaterally Cardio: regular rate and rhythm GI: normal findings: bowel sounds normal and soft, non-tender Extremities: extremities normal, atraumatic, no cyanosis or edema and LLE wrapped, swollen, erythematous, mild tenderness  Lab Results Recent Labs    12/25/18 0342 12/26/18 0446  WBC 15.5* 11.0*  HGB 8.3* 8.8*  HCT 27.0* 28.3*  NA 132* 135  K 4.5 4.6  CL 103 104  CO2 19* 20*  BUN 41* 37*  CREATININE 3.28* 3.24*   Liver Panel Recent Labs    12/24/18 1253  PROT 6.1*  ALBUMIN 2.6*  AST 145*  ALT 133*  ALKPHOS 99  BILITOT 0.8   Sedimentation Rate Recent Labs    12/26/18 0446  ESRSEDRATE 102*   C-Reactive Protein Recent Labs    12/24/18 1253 12/26/18 0446  CRP 20.6* 29.7*    Microbiology: Recent Results (from the past 240 hour(s))  Group A Strep by PCR     Status: None   Collection Time: 12/23/18  9:38 AM  Result Value Ref Range Status   Group A Strep by PCR NOT DETECTED NOT DETECTED Final    Comment: Performed at [facility], [place] [address], [place], [place] [address]  Culture, blood (single)     Status: Abnormal (Preliminary result)   Collection Time: 12/23/18  9:40 AM  Result Value Ref Range Status   Specimen Description BLOOD LEFT HAND  Final   Special Requests   Final    BOTTLES DRAWN AEROBIC AND ANAEROBIC Blood Culture adequate volume   Culture  Setup Time   Final    AEROBIC BOTTLE ONLY GRAM POSITIVE COCCI Organism ID to follow CRITICAL RESULT CALLED TO, READ BACK BY AND VERIFIED WITH: K.NEIL,RN AT 6503 ON 12/24/18 BY G.[email or web address] Performed at [facility], [place] [address], [place], [place] [address]    Culture STREPTOCOCCUS GROUP G REPEATING SENSITIVITY  (A)  Final   Report Status PENDING  Incomplete  Blood Culture  ID Panel (Reflexed)     Status: Abnormal   Collection Time: 12/23/18  9:40 AM  Result Value Ref Range Status   Enterococcus species NOT DETECTED NOT DETECTED Final   Listeria monocytogenes NOT DETECTED NOT DETECTED Final   Staphylococcus species NOT DETECTED NOT DETECTED Final   Staphylococcus aureus (BCID) NOT DETECTED NOT DETECTED Final   Streptococcus species DETECTED (A) NOT DETECTED Final    Comment: Not Enterococcus species, Streptococcus agalactiae, Streptococcus pyogenes, or Streptococcus pneumoniae. CRITICAL RESULT CALLED TO, READ BACK BY AND VERIFIED WITH: K.NEIL,RN AT 0247 ON 12/24/18 BY G.MCADOO    Streptococcus agalactiae NOT DETECTED NOT DETECTED Final   Streptococcus pneumoniae NOT DETECTED NOT DETECTED Final   Streptococcus pyogenes NOT DETECTED NOT DETECTED Final   Acinetobacter baumannii NOT DETECTED NOT DETECTED Final   Enterobacteriaceae species NOT DETECTED NOT DETECTED Final   Enterobacter cloacae complex NOT DETECTED NOT DETECTED Final   Escherichia coli NOT DETECTED NOT DETECTED Final   Klebsiella oxytoca NOT DETECTED NOT DETECTED Final   Klebsiella pneumoniae NOT DETECTED NOT DETECTED Final   Proteus species NOT DETECTED NOT DETECTED Final   Serratia marcescens NOT DETECTED NOT DETECTED Final   Haemophilus influenzae NOT DETECTED NOT DETECTED Final   Neisseria meningitidis NOT DETECTED NOT DETECTED Final   Pseudomonas aeruginosa NOT DETECTED NOT DETECTED Final   Candida albicans NOT DETECTED NOT DETECTED Final   Candida glabrata NOT DETECTED NOT DETECTED Final   Candida krusei NOT DETECTED NOT DETECTED Final   Candida parapsilosis NOT DETECTED NOT DETECTED Final   Candida tropicalis NOT DETECTED NOT DETECTED Final    Comment: Performed at [facility], [place]. [address], [place], [place] [address]  Blood culture (routine x 2)     Status: None (Preliminary result)   Collection Time: 12/24/18 12:53 PM  Result Value Ref Range Status   Specimen Description  BLOOD RIGHT HAND  Final   Special Requests   Final    BOTTLES DRAWN AEROBIC AND ANAEROBIC Blood Culture results may not be optimal due to an inadequate volume of blood received in culture bottles Performed at [place] [address] [place] [address], [place], [place] [address]    Culture NO GROWTH 1 DAY  Final   Report Status PENDING  Incomplete  Blood culture (routine x 2)     Status: None (Preliminary result)   Collection Time: 12/24/18 12:58 PM  Result Value Ref Range Status   Specimen Description BLOOD LEFT HAND  Final   Special Requests   Final  BOTTLES DRAWN AEROBIC AND ANAEROBIC Blood Culture adequate volume Performed at [facility], [place] [address] [place], [place] [address]    Culture NO GROWTH 1 DAY  Final   Report Status PENDING  Incomplete  Urine Culture     Status: None   Collection Time: 12/25/18 11:57 AM  Result Value Ref Range Status   Specimen Description URINE, RANDOM  Final   Special Requests NONE  Final   Culture   Final    NO GROWTH Performed at [facility], [place] [address] [place], [place] [address]    Report Status 12/26/2018 FINAL  Final    Studies/Results: Mr Tibia Fibula Left Wo Contrast  Result Date: 12/25/2018 CLINICAL DATA:  Diabetic patient with left lower leg pain and swelling. Chronic wound over the lateral malleolus of the left ankle. EXAM: MRI OF LOWER LEFT EXTREMITY WITHOUT CONTRAST TECHNIQUE: Multiplanar, multisequence MR imaging of the left lower leg was performed. No intravenous contrast was administered. COMPARISON:  None. FINDINGS: Bones/Joint/Cartilage Marrow edema in the distal 5 cm of the tibia and fibula is identified as seen on the patient's ankle MRI yesterday. Marrow signal is otherwise normal. Ligaments Negative. Muscles and Tendons There is mild fatty atrophy of intrinsic musculature of the lower leg. Intermediate increased T2 signal in all lower leg musculature is compatible with diabetic myopathy/denervation. A small amount  of fluid is seen interposed between the soleus and medial gastrocnemius. No intramuscular fluid collection. No muscle tear or strain. Soft tissues Intense subcutaneous edema about the lower legs bilaterally appears worse on the left. IMPRESSION: Marrow edema in the distal 5 cm of the tibia and fibula as seen on MRI yesterday could be secondary to stress change or osteomyelitis. Marrow signal is otherwise normal. Intense subcutaneous edema in the lower legs bilaterally is worse on the left and compatible with dependent change and/or cellulitis. Small volume of fluid interposed between the medial head of gastrocnemius and soleus could be due to dependent change or infection. Intermediate increased T2 signal in all lower leg musculature is most consistent with diabetic myopathy and denervation. No intramuscular fluid collection. Electronically Signed   By: Inge Rise M.D.   On: 12/25/2018 12:03   Mr Ankle Left Wo Contrast  Result Date: 12/25/2018 CLINICAL DATA:  Cellulitis with swelling bacteremia of the foot. EXAM: MRI OF THE LEFT ANKLE WITHOUT CONTRAST TECHNIQUE: Multiplanar, multisequence MR imaging of the ankle was performed. No intravenous contrast was administered. COMPARISON:  CT scan 09/16/2018 FINDINGS: TENDONS Peroneal: Thickened common peroneus tendon sheath posterior to the lateral malleolus. Moderate peroneus longus tendinopathy. Posteromedial: Prominent distal tibialis posterior tendinopathy. Ill definition the flexor digitorum longus in the vicinity of the knot of Henry suggesting partial tearing. Anterior: Tendinopathy of the tibialis anterior and extensor hallucis longus. Achilles: Fusiform thickening and accentuated signal in the distal Achilles tendon compatible with mild tendinopathy. Plantar Fascia: Abnormal thickening of the medial band of the plantar fascia with adjacent edema with plantar fasciitis. LIGAMENTS Lateral: The talus is dislocated, and thus the talofibular ligaments are  torn. The inferior tibiofibular ligaments are indistinct. The calcaneofibular ligament is indistinct. There is some marrow edema in the calcaneus near the expected attachment site of the calcaneofibular ligament. Medial: The medial malleolus is thought to have fragmented. Amorphous soft tissue in the expected location of the tibiospring and tibionavicular portions of the deltoid ligament. Obviously the deep tibiotalar portion of the deltoid ligament is completely torn. CARTILAGE Ankle Joint and subtalar joint: Part of the  talus, possibly constituting a portion of the talar dome, is dislocated about 4.5 cm anteriorly from its expected location, and demonstrates regions of low T2 signal possibly from osteonecrosis. Most of the talus S collapsed or necrosis, and in the expected location of the talus there is a 4.3 by 3.0 by 3.3 cm debris-filled cavity. The tibia partially pseudo articulates with the calcaneus, and there is resorption of much of the upper calcaneus as well. Bones: Continuing the list of osseous abnormalities in this case, there is scalloping and thinning of the anterior calcaneus full long within eroded cuboid bone, as well as fragmentation and collapse of the middle and lateral cuneiform spur with severe irregularity, spurring, and erosions along the Lisfranc joint at the second through fifth digits. A bony fragment from the middle cuneiform may have fused in with the medial cuneiform. I favor that most of the findings seen in this case are neuropathic due to Charcot hindfoot and midfoot. If the patient has an occult draining sinus tract in the may be an indicator of a component of infection. Other: Extensive subcutaneous edema along the foot which may be reactive or due to cellulitis. IMPRESSION: 1. Severe Charcot arthropathy of the hindfoot and midfoot, with dislocation of the talus; destruction of much of the talus; resorption/destruction of the middle and lateral cuneiforms; extensive erosion of  the anterior calcaneus and portions of the cuboid; extensive erosions along the bases of the metatarsals; interval fragmentation of the medial malleolus; and concomitant ligamentous disruptions. 2. Diffuse subcutaneous edema in the ankle may be reactive or due to cellulitis. 3. Flexor and extensor tendinopathy is noted above. 4. Achilles tendinopathy distally. 5. Plantar fasciitis. Electronically Signed   By: Van Clines M.D.   On: 12/25/2018 08:27   Vas [name] Lower Extremity Venous (dvt)  Result Date: 12/25/2018  Lower Venous Study Indications: Swelling.  Limitations: Body habitus. Performing Technologist: Antonieta Pert RDMS, RVT  Examination Guidelines: A complete evaluation includes B-mode imaging, spectral Doppler, color Doppler, and power Doppler as needed of all accessible portions of each vessel. Bilateral testing is considered an integral part of a complete examination. Limited examinations for reoccurring indications may be performed as noted.  Right Venous Findings: +---+---------------+---------+-----------+----------+-------+    CompressibilityPhasicitySpontaneityPropertiesSummary +---+---------------+---------+-----------+----------+-------+ CFVFull           Yes      Yes                          +---+---------------+---------+-----------+----------+-------+  Left Venous Findings: +---------+---------------+---------+-----------+----------+--------------+          CompressibilityPhasicitySpontaneityPropertiesSummary        +---------+---------------+---------+-----------+----------+--------------+ CFV      Full           Yes      Yes                                 +---------+---------------+---------+-----------+----------+--------------+ SFJ      Full                                                        +---------+---------------+---------+-----------+----------+--------------+ FV Prox  Full                                                         +---------+---------------+---------+-----------+----------+--------------+  FV Mid   Full                                                        +---------+---------------+---------+-----------+----------+--------------+ FV DistalFull                                                        +---------+---------------+---------+-----------+----------+--------------+ PFV      Full                                                        +---------+---------------+---------+-----------+----------+--------------+ POP      Full           Yes      Yes                                 +---------+---------------+---------+-----------+----------+--------------+ PTV                                                   Not visualized +---------+---------------+---------+-----------+----------+--------------+ PERO                                                  Not visualized +---------+---------------+---------+-----------+----------+--------------+ GSV      Full                                                        +---------+---------------+---------+-----------+----------+--------------+    Summary: Right: No evidence of common femoral vein obstruction. Left: There is no evidence of deep vein thrombosis in the lower extremity. However, portions of this examination were limited- see technologist comments above. No cystic structure found in the popliteal fossa. Prominent lymph nodes noted, largest measuring 3.2cm  *See table(s) above for measurements and observations. Electronically signed by Deitra Mayo MD on 12/25/2018 at 2:35:28 PM.    Final      Assessment/Plan: Group G strep bacteremia L ankle ulcer DM2 CKD4 CHF Osteomyelitis vs Stress L tib-fib MRI  Total days of antibiotics: 2 ceftriaxone  Repeat BCx 2-20 are ngtd sensi on 2-19 are pending.  This strep is nearly uniformly sensitive to PEN, continue ceftriaxone Await TTE read , TEE pending  (2-24) WOC  Await TEE read prior to making definitive rec         Bobby Rumpf MD, FACP Infectious Diseases (pager) [phone] [email or web address] 12/26/2018, 1:52 PM  LOS: 2 days

## 2018-12-27 LAB — CBC
HCT: 26.7 % — ABNORMAL LOW (ref 36.0–46.0)
Hemoglobin: 8.2 g/dL — ABNORMAL LOW (ref 12.0–15.0)
MCH: 28.6 pg (ref 26.0–34.0)
MCHC: 30.7 g/dL (ref 30.0–36.0)
MCV: 93 fL (ref 80.0–100.0)
Platelets: 153 10*3/uL (ref 150–400)
RBC: 2.87 MIL/uL — ABNORMAL LOW (ref 3.87–5.11)
RDW: 13.4 % (ref 11.5–15.5)
WBC: 11.4 10*3/uL — ABNORMAL HIGH (ref 4.0–10.5)
nRBC: 0 % (ref 0.0–0.2)

## 2018-12-27 LAB — BASIC METABOLIC PANEL
Anion gap: 12 (ref 5–15)
BUN: 37 mg/dL — ABNORMAL HIGH (ref 6–20)
CO2: 17 mmol/L — ABNORMAL LOW (ref 22–32)
Calcium: 8.1 mg/dL — ABNORMAL LOW (ref 8.9–10.3)
Chloride: 104 mmol/L (ref 98–111)
Creatinine, Ser: 3.03 mg/dL — ABNORMAL HIGH (ref 0.44–1.00)
GFR calc Af Amer: 19 mL/min — ABNORMAL LOW (ref >60)
GFR calc non Af Amer: 16 mL/min — ABNORMAL LOW (ref >60)
Glucose, Bld: 213 mg/dL — ABNORMAL HIGH (ref 70–99)
Potassium: 4.6 mmol/L (ref 3.5–5.1)
Sodium: 133 mmol/L — ABNORMAL LOW (ref 135–145)

## 2018-12-27 LAB — CULTURE, BLOOD (SINGLE): Special Requests: ADEQUATE

## 2018-12-27 LAB — GLUCOSE, CAPILLARY
Glucose-Capillary: 160 mg/dL — ABNORMAL HIGH (ref 70–99)
Glucose-Capillary: 177 mg/dL — ABNORMAL HIGH (ref 70–99)
Glucose-Capillary: 181 mg/dL — ABNORMAL HIGH (ref 70–99)
Glucose-Capillary: 199 mg/dL — ABNORMAL HIGH (ref 70–99)

## 2018-12-27 MED ORDER — SODIUM BICARBONATE 650 MG PO TABS
650.0000 mg | ORAL_TABLET | Freq: Two times a day (BID) | ORAL | Status: DC
  Administered 2018-12-27 – 2018-12-31 (×8): 650 mg via ORAL
  Filled 2018-12-27 (×9): qty 1

## 2018-12-27 NOTE — Progress Notes (Signed)
PROGRESS NOTE        PATIENT DETAILS Name: Rachel Holmes Age: 58 y.o. Sex: female Date of Birth: Aug 17, 1961 Admit Date: 12/24/2018 Admitting Physician Barb Merino, MD [id number], Rubbie Battiest, MD  Brief Narrative: Patient is a 59 y.o. female-2, hypertension, peripheral neuropathy, Charcot joint involving left  foot presenting with 3-4-day history of fever, myalgias, left leg swelling-evaluated in the ED on 2/19, blood cultures drawn and subsequently sent home.  Brought back to the ED after blood cultures were positive for Streptococcus.  See below for further details.  Subjective: No chest pain or SOB. Left leg with mild erythema-still mildly tender and swollen  Assessment/Plan: Sepsis secondary to group G streptococcal bacteremia with possible aortic valve endocarditis and left leg soft tissue infection: Sepsis physiology has improved, leukocytosis has almost resolved-mild low-grade fever this morning.  Left leg still appears to be inflamed-erythematous and swollen-mildly tender-but compartments remain soft.  Lower extremity Doppler negative for DVT (limited study), MRI without any obvious abscess.  Continue IV Rocephin, tentatively scheduled for TEE on 2/24-but possible small aortic valve vegetation seen on transthoracic echocardiogram.  Repeat blood cultures on 2/20 are negative so far.  AKI on CKD stage IV: AKI hemodynamically mediated-creatinine slowly downtrending-still not yet at baseline.  Volume status is stable-off all IV fluids at this point.  Continue to follow electrolytes.  Chronic diastolic heart failure: Volume status is stable-follow weights/intake/output-we will asked nursing staff to see if we can get weights daily.  Hypertension: Blood pressure on the higher side-fluctuating-for now continue with amlodipine, clonidine, Imdur-and follow   DM-2: CBGs stable-continue moderate scale SSI, Prandin and Trulicity.  Follow and optimize.    Left leg  ulceration: Present prior to admission-continue wound care per wound care team.  History of Charcot arthropathy  DVT Prophylaxis: Prophylactic Heparin   Code Status: Full code   Family Communication: None at bedside  Disposition Plan: Remain inpatient  Antimicrobial agents: Anti-infectives (From admission, onward)   Start     Dose/Rate Route Frequency Ordered Stop   12/25/18 1400  ceFEPIme (MAXIPIME) 1 g in sodium chloride 0.9 % 100 mL IVPB  Status:  Discontinued     1 g 200 mL/hr over 30 Minutes Intravenous Every 24 hours 12/24/18 1529 12/24/18 1551   12/24/18 1600  cefTRIAXone (ROCEPHIN) 2 g in sodium chloride 0.9 % 100 mL IVPB     2 g 200 mL/hr over 30 Minutes Intravenous Every 24 hours 12/24/18 1551     12/24/18 1400  ceFEPIme (MAXIPIME) 1 g in sodium chloride 0.9 % 100 mL IVPB     1 g 200 mL/hr over 30 Minutes Intravenous  Once 12/24/18 1254 12/24/18 1613   12/24/18 1400  vancomycin (VANCOCIN) 2,500 mg in sodium chloride 0.9 % 500 mL IVPB     2,500 mg 250 mL/hr over 120 Minutes Intravenous  Once 12/24/18 1311 12/24/18 2007      Procedures: None  CONSULTS:  ID  Time spent: 25- minutes-Greater than 50% of this time was spent in counseling, explanation of diagnosis, planning of further management, and coordination of care.  MEDICATIONS: Scheduled Meds: . amLODipine  10 mg Oral Daily  . aspirin EC  325 mg Oral Daily  . cloNIDine  0.3 mg Oral BID  . docusate sodium  100 mg Oral BID  . Dulaglutide  1.5 mg Subcutaneous Q Sun  .  DULoxetine  30 mg Oral Daily  . gabapentin  600 mg Oral QHS  . heparin  5,000 Units Subcutaneous Q8H  . insulin aspart  0-15 Units Subcutaneous TID WC  . insulin aspart  0-5 Units Subcutaneous QHS  . isosorbide mononitrate  120 mg Oral Daily  . pantoprazole  40 mg Oral Daily  . repaglinide  1 mg Oral TID AC  . sodium bicarbonate  650 mg Oral BID   Continuous Infusions: . cefTRIAXone (ROCEPHIN)  IV 2 g (12/26/18 2141)   PRN  Meds:.acetaminophen, albuterol, traMADol   PHYSICAL EXAM: Vital signs: Vitals:   12/26/18 1404 12/26/18 1752 12/26/18 2148 12/27/18 0451  BP: (!) 167/82 (!) 157/74 (!) 186/78 (!) 152/75  Pulse: 88 99 (!) 117 79  Resp: (!) 22  19 18   Temp: 98.5 F (36.9 C)  (!) 100.4 F (38 C) 98.7 F (37.1 C)  TempSrc: Oral  Oral Oral  SpO2: 94% 95% 93% 90%  Weight:      Height:       Filed Weights   12/24/18 1500  Weight: (!) 138.8 kg   Body mass index is 49.39 kg/m.   General appearance:Awake, alert, not in any distress.  Eyes:no scleral icterus. HEENT: Atraumatic and Normocephalic Neck: supple, no JVD. Resp:Good air entry bilaterally,no rales or rhonchi CVS: S1 S2 regular GI: Bowel sounds present, Non tender and not distended with no gaurding, rigidity or rebound. Extremities: Left leg continues to be swollen, erythematous and mildly tender.  Compartments are soft. Neurology:  Non focal Musculoskeletal:No digital cyanosis Skin:No Rash, warm and dry Wounds:N/A  I have personally reviewed following labs and imaging studies  LABORATORY DATA: CBC: Recent Labs  Lab 12/23/18 0943 12/24/18 1253 12/25/18 0342 12/26/18 0446 12/27/18 0512  WBC 10.8* 18.0* 15.5* 11.0* 11.4*  NEUTROABS 9.7*  --   --   --   --   HGB 11.1* 9.3* 8.3* 8.8* 8.2*  HCT 35.8* 30.9* 27.0* 28.3* 26.7*  MCV 93.7 94.8 92.5 93.1 93.0  PLT 223 153 146* 149* 836    Basic Metabolic Panel: Recent Labs  Lab 12/23/18 0943 12/24/18 1253 12/25/18 0342 12/26/18 0446 12/27/18 0512  NA 138 131* 132* 135 133*  K 5.0 5.7* 4.5 4.6 4.6  CL 106 101 103 104 104  CO2 21* 18* 19* 20* 17*  GLUCOSE 159* 314* 146* 137* 213*  BUN 36* 43* 41* 37* 37*  CREATININE 2.75* 3.43* 3.28* 3.24* 3.03*  CALCIUM 8.9 7.8* 7.5* 8.2* 8.1*    GFR: Estimated Creatinine Clearance: 29.5 mL/min (A) (by C-G formula based on SCr of 3.03 mg/dL (H)).  Liver Function Tests: Recent Labs  Lab 12/23/18 0943 12/24/18 1253  AST 69* 145*   ALT 39 133*  ALKPHOS 116 99  BILITOT 0.8 0.8  PROT 8.0 6.1*  ALBUMIN 3.9 2.6*   No results for input(s): LIPASE, AMYLASE in the last 168 hours. No results for input(s): AMMONIA in the last 168 hours.  Coagulation Profile: No results for input(s): INR, PROTIME in the last 168 hours.  Cardiac Enzymes: No results for input(s): CKTOTAL, CKMB, CKMBINDEX, TROPONINI in the last 168 hours.  BNP (last 3 results) No results for input(s): PROBNP in the last 8760 hours.  HbA1C: No results for input(s): HGBA1C in the last 72 hours.  CBG: Recent Labs  Lab 12/26/18 0757 12/26/18 1156 12/26/18 1712 12/26/18 2144 12/27/18 0819  GLUCAP 135* 171* 182* 205* 160*    Lipid Profile: No results for input(s): CHOL, HDL, LDLCALC, TRIG,  CHOLHDL, LDLDIRECT in the last 72 hours.  Thyroid Function Tests: No results for input(s): TSH, T4TOTAL, FREET4, T3FREE, THYROIDAB in the last 72 hours.  Anemia Panel: No results for input(s): VITAMINB12, FOLATE, FERRITIN, TIBC, IRON, RETICCTPCT in the last 72 hours.  Urine analysis:    Component Value Date/Time   COLORURINE YELLOW 12/23/2018 1145   APPEARANCEUR CLEAR 12/23/2018 1145   LABSPEC 1.012 12/23/2018 1145   PHURINE 6.0 12/23/2018 1145   GLUCOSEU NEGATIVE 12/23/2018 1145   HGBUR NEGATIVE 12/23/2018 1145   BILIRUBINUR NEGATIVE 12/23/2018 1145   KETONESUR NEGATIVE 12/23/2018 1145   PROTEINUR 100 (A) 12/23/2018 1145   NITRITE NEGATIVE 12/23/2018 1145   LEUKOCYTESUR TRACE (A) 12/23/2018 1145    Sepsis Labs: Lactic Acid, Venous    Component Value Date/Time   LATICACIDVEN 2.1 ([place]) 12/23/2018 0943    MICROBIOLOGY: Recent Results (from the past 240 hour(s))  Group A Strep by PCR     Status: None   Collection Time: 12/23/18  9:38 AM  Result Value Ref Range Status   Group A Strep by PCR NOT DETECTED NOT DETECTED Final    Comment: Performed at [facility], [place] [address], [place], [place] [address]  Culture, blood (single)      Status: Abnormal   Collection Time: 12/23/18  9:40 AM  Result Value Ref Range Status   Specimen Description BLOOD LEFT HAND  Final   Special Requests   Final    BOTTLES DRAWN AEROBIC AND ANAEROBIC Blood Culture adequate volume   Culture  Setup Time   Final    AEROBIC BOTTLE ONLY GRAM POSITIVE COCCI Organism ID to follow CRITICAL RESULT CALLED TO, READ BACK BY AND VERIFIED WITH: K.NEIL,RN AT 2426 ON 12/24/18 BY G.MCADOO Performed at [facility], [place] [address], [place], [place] [address]    Culture STREPTOCOCCUS GROUP G (A)  Final   Report Status 12/27/2018 FINAL  Final   Organism ID, Bacteria STREPTOCOCCUS GROUP G  Final      Susceptibility   Streptococcus group g - MIC*    CLINDAMYCIN <=0.25 SENSITIVE Sensitive     AMPICILLIN <=0.25 SENSITIVE Sensitive     ERYTHROMYCIN <=0.12 SENSITIVE Sensitive     VANCOMYCIN 0.5 SENSITIVE Sensitive     CEFTRIAXONE <=0.12 SENSITIVE Sensitive     LEVOFLOXACIN 0.5 SENSITIVE Sensitive     * STREPTOCOCCUS GROUP G  Blood Culture ID Panel (Reflexed)     Status: Abnormal   Collection Time: 12/23/18  9:40 AM  Result Value Ref Range Status   Enterococcus species NOT DETECTED NOT DETECTED Final   Listeria monocytogenes NOT DETECTED NOT DETECTED Final   Staphylococcus species NOT DETECTED NOT DETECTED Final   Staphylococcus aureus (BCID) NOT DETECTED NOT DETECTED Final   Streptococcus species DETECTED (A) NOT DETECTED Final    Comment: Not Enterococcus species, Streptococcus agalactiae, Streptococcus pyogenes, or Streptococcus pneumoniae. CRITICAL RESULT CALLED TO, READ BACK BY AND VERIFIED WITH: K.NEIL,RN AT 0247 ON 12/24/18 BY G.MCADOO    Streptococcus agalactiae NOT DETECTED NOT DETECTED Final   Streptococcus pneumoniae NOT DETECTED NOT DETECTED Final   Streptococcus pyogenes NOT DETECTED NOT DETECTED Final   Acinetobacter baumannii NOT DETECTED NOT DETECTED Final   Enterobacteriaceae species NOT DETECTED NOT DETECTED Final   Enterobacter  cloacae complex NOT DETECTED NOT DETECTED Final   Escherichia coli NOT DETECTED NOT DETECTED Final   Klebsiella oxytoca NOT DETECTED NOT DETECTED Final   Klebsiella pneumoniae NOT DETECTED NOT DETECTED Final   Proteus species NOT  DETECTED NOT DETECTED Final   Serratia marcescens NOT DETECTED NOT DETECTED Final   Haemophilus influenzae NOT DETECTED NOT DETECTED Final   Neisseria meningitidis NOT DETECTED NOT DETECTED Final   Pseudomonas aeruginosa NOT DETECTED NOT DETECTED Final   Candida albicans NOT DETECTED NOT DETECTED Final   Candida glabrata NOT DETECTED NOT DETECTED Final   Candida krusei NOT DETECTED NOT DETECTED Final   Candida parapsilosis NOT DETECTED NOT DETECTED Final   Candida tropicalis NOT DETECTED NOT DETECTED Final    Comment: Performed at [facility], [place] [address] [place], [place] [address]  Blood culture (routine x 2)     Status: None (Preliminary result)   Collection Time: 12/24/18 12:53 PM  Result Value Ref Range Status   Specimen Description BLOOD RIGHT HAND  Final   Special Requests   Final    BOTTLES DRAWN AEROBIC AND ANAEROBIC Blood Culture results may not be optimal due to an inadequate volume of blood received in culture bottles Performed at [facility] [address] [place], [place] [address]    Culture NO GROWTH 3 DAYS  Final   Report Status PENDING  Incomplete  Blood culture (routine x 2)     Status: None (Preliminary result)   Collection Time: 12/24/18 12:58 PM  Result Value Ref Range Status   Specimen Description BLOOD LEFT HAND  Final   Special Requests   Final    BOTTLES DRAWN AEROBIC AND ANAEROBIC Blood Culture adequate volume Performed at [facility], [place] [address] [place], [place] [address]    Culture NO GROWTH 3 DAYS  Final   Report Status PENDING  Incomplete  Urine Culture     Status: None   Collection Time: 12/25/18 11:57 AM  Result Value Ref Range Status   Specimen Description URINE, RANDOM  Final   Special  Requests NONE  Final   Culture   Final    NO GROWTH Performed at [facility], [place] [address], [place],  [address]    Report Status 12/26/2018 FINAL  Final    RADIOLOGY STUDIES/RESULTS: Dg Chest 2 View  Result Date: 12/23/2018 CLINICAL DATA:  Fever and chills EXAM: CHEST - 2 VIEW COMPARISON:  February 03, 2017 FINDINGS: The lungs are clear. The heart size and pulmonary vascularity are normal. No adenopathy. There is degenerative change in the lower thoracic spine. IMPRESSION: No edema or consolidation. Electronically Signed   By: Lowella Grip III M.D.   On: 12/23/2018 10:14   Dg Knee 1-2 Views Right  Result Date: 12/02/2018 CLINICAL DATA:  RIGHT knee pain and swelling for 2 weeks post fall EXAM: RIGHT KNEE - 1-2 VIEW COMPARISON:  12/31/2017 FINDINGS: Osseous demineralization. Tricompartmental osteoarthritic changes with joint space narrowing and spur formation. Mild subchondral cyst formation at medial compartment, which demonstrates the greatest degree of narrowing. No acute fracture, dislocation, or bone destruction. No knee joint effusion. Scattered atherosclerotic calcifications. IMPRESSION: Tricompartmental osteoarthritic changes of the RIGHT knee greatest at medial compartment. Electronically Signed   By: Lavonia Dana M.D.   On: 12/02/2018 16:18   US Renal  Result Date: 12/26/2018 CLINICAL DATA:  Acute kidney injury. EXAM: RENAL / URINARY TRACT ULTRASOUND COMPLETE COMPARISON:  None. FINDINGS: Right Kidney: Renal measurements: 10.1 x 4.0 x 5.6 cm = volume: 120 mL. Increased echogenicity in the right kidney without hydronephrosis. There are at least 2 small hypoechoic cysts in the right kidney, largest measuring 1.4 cm. No suspicious renal lesion. Cortical thinning. Left  Kidney: Renal measurements: 11.1 x 6.5 x 5.4 cm = volume: 203 mL. Normal echogenicity. Negative for hydronephrosis. Hypoechoic cyst in the lower pole measuring up to 2.2 cm. Cortical thinning. Bladder: Appears  normal for degree of bladder distention. IMPRESSION: 1. Negative for hydronephrosis. 2. Cortical thinning in both kidneys. Increased echogenicity in the right kidney. Findings may represent chronic medical renal disease. 3. Bilateral renal cysts. Electronically Signed   By: Markus Daft M.D.   On: 12/26/2018 14:36   Mr Tibia Fibula Left Wo Contrast  Result Date: 12/25/2018 CLINICAL DATA:  Diabetic patient with left lower leg pain and swelling. Chronic wound over the lateral malleolus of the left ankle. EXAM: MRI OF LOWER LEFT EXTREMITY WITHOUT CONTRAST TECHNIQUE: Multiplanar, multisequence MR imaging of the left lower leg was performed. No intravenous contrast was administered. COMPARISON:  None. FINDINGS: Bones/Joint/Cartilage Marrow edema in the distal 5 cm of the tibia and fibula is identified as seen on the patient's ankle MRI yesterday. Marrow signal is otherwise normal. Ligaments Negative. Muscles and Tendons There is mild fatty atrophy of intrinsic musculature of the lower leg. Intermediate increased T2 signal in all lower leg musculature is compatible with diabetic myopathy/denervation. A small amount of fluid is seen interposed between the soleus and medial gastrocnemius. No intramuscular fluid collection. No muscle tear or strain. Soft tissues Intense subcutaneous edema about the lower legs bilaterally appears worse on the left. IMPRESSION: Marrow edema in the distal 5 cm of the tibia and fibula as seen on MRI yesterday could be secondary to stress change or osteomyelitis. Marrow signal is otherwise normal. Intense subcutaneous edema in the lower legs bilaterally is worse on the left and compatible with dependent change and/or cellulitis. Small volume of fluid interposed between the medial head of gastrocnemius and soleus could be due to dependent change or infection. Intermediate increased T2 signal in all lower leg musculature is most consistent with diabetic myopathy and denervation. No intramuscular  fluid collection. Electronically Signed   By: Inge Rise M.D.   On: 12/25/2018 12:03   Mr Ankle Left Wo Contrast  Result Date: 12/25/2018 CLINICAL DATA:  Cellulitis with swelling bacteremia of the foot. EXAM: MRI OF THE LEFT ANKLE WITHOUT CONTRAST TECHNIQUE: Multiplanar, multisequence MR imaging of the ankle was performed. No intravenous contrast was administered. COMPARISON:  CT scan 09/16/2018 FINDINGS: TENDONS Peroneal: Thickened common peroneus tendon sheath posterior to the lateral malleolus. Moderate peroneus longus tendinopathy. Posteromedial: Prominent distal tibialis posterior tendinopathy. Ill definition the flexor digitorum longus in the vicinity of the knot of Henry suggesting partial tearing. Anterior: Tendinopathy of the tibialis anterior and extensor hallucis longus. Achilles: Fusiform thickening and accentuated signal in the distal Achilles tendon compatible with mild tendinopathy. Plantar Fascia: Abnormal thickening of the medial band of the plantar fascia with adjacent edema with plantar fasciitis. LIGAMENTS Lateral: The talus is dislocated, and thus the talofibular ligaments are torn. The inferior tibiofibular ligaments are indistinct. The calcaneofibular ligament is indistinct. There is some marrow edema in the calcaneus near the expected attachment site of the calcaneofibular ligament. Medial: The medial malleolus is thought to have fragmented. Amorphous soft tissue in the expected location of the tibiospring and tibionavicular portions of the deltoid ligament. Obviously the deep tibiotalar portion of the deltoid ligament is completely torn. CARTILAGE Ankle Joint and subtalar joint: Part of the talus, possibly constituting a portion of the talar dome, is dislocated about 4.5 cm anteriorly from its expected location, and demonstrates regions of low T2 signal possibly from osteonecrosis. Most  of the talus S collapsed or necrosis, and in the expected location of the talus there is a 4.3  by 3.0 by 3.3 cm debris-filled cavity. The tibia partially pseudo articulates with the calcaneus, and there is resorption of much of the upper calcaneus as well. Bones: Continuing the list of osseous abnormalities in this case, there is scalloping and thinning of the anterior calcaneus full long within eroded cuboid bone, as well as fragmentation and collapse of the middle and lateral cuneiform spur with severe irregularity, spurring, and erosions along the Lisfranc joint at the second through fifth digits. A bony fragment from the middle cuneiform may have fused in with the medial cuneiform. I favor that most of the findings seen in this case are neuropathic due to Charcot hindfoot and midfoot. If the patient has an occult draining sinus tract in the may be an indicator of a component of infection. Other: Extensive subcutaneous edema along the foot which may be reactive or due to cellulitis. IMPRESSION: 1. Severe Charcot arthropathy of the hindfoot and midfoot, with dislocation of the talus; destruction of much of the talus; resorption/destruction of the middle and lateral cuneiforms; extensive erosion of the anterior calcaneus and portions of the cuboid; extensive erosions along the bases of the metatarsals; interval fragmentation of the medial malleolus; and concomitant ligamentous disruptions. 2. Diffuse subcutaneous edema in the ankle may be reactive or due to cellulitis. 3. Flexor and extensor tendinopathy is noted above. 4. Achilles tendinopathy distally. 5. Plantar fasciitis. Electronically Signed   By: Van Clines M.D.   On: 12/25/2018 08:27   [name] Limited Joint Space Structures Low Right  Result Date: 12/18/2018 No images saved   Vas [name] Lower Extremity Venous (dvt)  Result Date: 12/25/2018  Lower Venous Study Indications: Swelling.  Limitations: Body habitus. Performing Technologist: Antonieta Pert RDMS, RVT  Examination Guidelines: A complete evaluation includes B-mode imaging, spectral  Doppler, color Doppler, and power Doppler as needed of all accessible portions of each vessel. Bilateral testing is considered an integral part of a complete examination. Limited examinations for reoccurring indications may be performed as noted.  Right Venous Findings: +---+---------------+---------+-----------+----------+-------+    CompressibilityPhasicitySpontaneityPropertiesSummary +---+---------------+---------+-----------+----------+-------+ CFVFull           Yes      Yes                          +---+---------------+---------+-----------+----------+-------+  Left Venous Findings: +---------+---------------+---------+-----------+----------+--------------+          CompressibilityPhasicitySpontaneityPropertiesSummary        +---------+---------------+---------+-----------+----------+--------------+ CFV      Full           Yes      Yes                                 +---------+---------------+---------+-----------+----------+--------------+ SFJ      Full                                                        +---------+---------------+---------+-----------+----------+--------------+ FV Prox  Full                                                        +---------+---------------+---------+-----------+----------+--------------+  FV Mid   Full                                                        +---------+---------------+---------+-----------+----------+--------------+ FV DistalFull                                                        +---------+---------------+---------+-----------+----------+--------------+ PFV      Full                                                        +---------+---------------+---------+-----------+----------+--------------+ POP      Full           Yes      Yes                                 +---------+---------------+---------+-----------+----------+--------------+ PTV                                                    Not visualized +---------+---------------+---------+-----------+----------+--------------+ PERO                                                  Not visualized +---------+---------------+---------+-----------+----------+--------------+ GSV      Full                                                        +---------+---------------+---------+-----------+----------+--------------+    Summary: Right: No evidence of common femoral vein obstruction. Left: There is no evidence of deep vein thrombosis in the lower extremity. However, portions of this examination were limited- see technologist comments above. No cystic structure found in the popliteal fossa. Prominent lymph nodes noted, largest measuring 3.2cm  *See table(s) above for measurements and observations. Electronically signed by Deitra Mayo MD on 12/25/2018 at 2:35:28 PM.    Final      LOS: 3 days   Oren Binet, MD  [facility]  If 7PM-7AM, please contact night-coverage  Please page via [email or web address]  Go to [email or web address] and use [facility]'s universal password to access. If you do not have the password, please contact the hospital operator.  Locate the [facility] provider you are looking for under [facility] and page to a number that you can be directly reached. If you still have difficulty reaching the provider, please page the [facility] (Director on Call) for the Hospitalists listed on amion for assistance.  12/27/2018, 12:24 PM

## 2018-12-27 NOTE — Evaluation (Signed)
Physical Therapy Evaluation Patient Details Name: Rachel Holmes MRN: [id number] DOB: 1960/11/28 Today's Date: 12/27/2018   History of Present Illness    58 y.o. female-2, hypertension, peripheral neuropathy, Charcot joint involving left  foot presenting with 3-4-day history of fever, myalgias, left leg swelling-evaluated in the ED on 2/19, blood cultures drawn and subsequently sent home.  Brought back to the ED after blood cultures were positive for Streptococcus.  MRI Findings: 1. Severe Charcot arthropathy of the hindfoot and midfoot, with dislocation of the talus; destruction of much of the talus; resorption/destruction of the middle and lateral cuneiforms; extensive erosion of the anterior calcaneus and portions of the cuboid; extensive erosions along the bases of the metatarsals; interval fragmentation of the medial malleolus; and concomitant ligamentous disruptions. 2. Diffuse subcutaneous edema in the ankle may be reactive or due to cellulitis. 3. Flexor and extensor tendinopathy is noted above. 4. Achilles tendinopathy distally. 5. Plantar fasciitis.    Clinical Impression  Limited evaluation completed due to instability of left foot/ankle.  Pt reports she is independent with transfers, uses WC in home at baseline, and when LLE is casted she is able to fully weight bear.  However, until cast can be replaced, pt will need to remain strictly NWB due to Charcot foot and severity of condition; pt understands and is able to independently direct assistance as such.  PT will focus efforts in acute setting to assure mobility with LLE NWB until medical course is determined.  Recommend nursing assist with OOB keeping LLE NWB and consult RNCM for HHPT as d/c nears.      Follow Up Recommendations Home health PT    Equipment Recommendations  None recommended by PT    Recommendations for Other Services       Precautions / Restrictions Precautions Precautions: Fall Precaution Comments: up with  assist, use RW for transfers Required Braces or Orthoses: (TBD after medical c/s completed) Restrictions Weight Bearing Restrictions: Yes LLE Weight Bearing: Non weight bearing Other Position/Activity Restrictions: KEEP LEFT LE NWB until foot recasted. Per pt, able to WBAT with cast in place, but cannot recast until wound addressed and/or plan for medical intervention determined.  UNTIL THEN, KEEP LEFT LE NWB       Mobility  Bed Mobility Overal bed mobility: Modified Independent             General bed mobility comments: able to transition to EOB unassisted but cannot leave left limb in dependent position so did not remain  Transfers                 General transfer comment: pt declined OOB this am, waiting on bath. discussed extensively risks of WB on non-casted limb, pt able to independently direct care and demonstrated use of RW, which pt is familiar with ; agreed to OOB after bath  Ambulation/Gait             General Gait Details: pt generally nonambulatory at baseline, uses WC for mobility, but is able to [facility] for transfers when LLE is casted to protrect foot/ankle  Stairs            Wheelchair Mobility    Modified Rankin (Stroke Patients Only)       Balance  Pertinent Vitals/Pain Pain Assessment: No/denies pain    Home Living Family/patient expects to be discharged to:: Private residence Living Arrangements: Spouse/significant other(ex-husband) Available Help at Discharge: Family;Available 24 hours/day Type of Home: House Home Access: Level entry     Home Layout: One level Home Equipment: Walker - 2 wheels;Wheelchair - manual Additional Comments: pt reports no limitations at home, accesses all areas with WC, and ex-spouse there to assist    Prior Function Level of Independence: Independent with assistive device(s)         Comments: tranfers to w/c independently, able to  fully WB LLE when casted (foot rolls if uncasted, CANNOT WB UNCASTED); has RW, rollator ordered.      Hand Dominance        Extremity/Trunk Assessment   Upper Extremity Assessment Upper Extremity Assessment: Defer to OT evaluation    Lower Extremity Assessment Lower Extremity Assessment: LLE deficits/detail(RLE reports OA in right knee) LLE Deficits / Details: Charcot foot, see MRI results for extent of degeneration; requires cast for support to WB and ambulate/locomote;       Communication   Communication: No difficulties  Cognition Arousal/Alertness: Awake/alert Behavior During Therapy: WFL for tasks assessed/performed Overall Cognitive Status: Within Functional Limits for tasks assessed                                        General Comments General comments (skin integrity, edema, etc.): noted edema/redness in LLE which is improved based on chart and pt report.  wound not visualized due to dressing in place    Exercises     Assessment/Plan    PT Assessment Patient needs continued PT services  PT Problem List Obesity;Decreased mobility;Decreased strength;Decreased range of motion;Decreased knowledge of use of DME       PT Treatment Interventions DME instruction;Gait training;Functional mobility training;Therapeutic activities;Therapeutic exercise;Neuromuscular re-education;Patient/family education    PT Goals (Current goals can be found in the Care Plan section)  Acute Rehab PT Goals Patient Stated Goal: eventually to replace right knee due to OA PT Goal Formulation: With patient Time For Goal Achievement: 01/10/19 Potential to Achieve Goals: Good    Frequency Min 3X/week   Barriers to discharge        Co-evaluation               AM-PAC PT "6 Clicks" Mobility  Outcome Measure Help needed turning from your back to your side while in a flat bed without using bedrails?: None Help needed moving from lying on your back to sitting on the  side of a flat bed without using bedrails?: None Help needed moving to and from a bed to a chair (including a wheelchair)?: A Little Help needed standing up from a chair using your arms (e.g., wheelchair or bedside chair)?: A Little Help needed to walk in hospital room?: A Little Help needed climbing 3-5 steps with a railing? : A Lot 6 Click Score: 19    End of Session   Activity Tolerance: Patient tolerated treatment well Patient left: in bed;with call bell/phone within reach Nurse Communication: Mobility status;Weight bearing status PT Visit Diagnosis: Difficulty in walking, not elsewhere classified (R26.2)    Time: 1010-1025 PT Time Calculation (min) (ACUTE ONLY): 15 min   Charges:   PT Evaluation $PT Eval Low Complexity: 1 Low          Kearney Hard, PT, DPT, MS Board Certified Geriatric Clinical  Specialist  Kearney Hard [facility] 12/27/2018, 10:35 AM

## 2018-12-28 ENCOUNTER — Encounter (HOSPITAL_COMMUNITY): Payer: Self-pay

## 2018-12-28 ENCOUNTER — Inpatient Hospital Stay (HOSPITAL_COMMUNITY): Payer: Medicare HMO | Admitting: Anesthesiology

## 2018-12-28 ENCOUNTER — Inpatient Hospital Stay (HOSPITAL_COMMUNITY): Payer: Medicare HMO

## 2018-12-28 ENCOUNTER — Telehealth: Payer: Self-pay | Admitting: Emergency Medicine

## 2018-12-28 ENCOUNTER — Encounter (HOSPITAL_COMMUNITY)
Admission: EM | Disposition: A | Discharge status: Home Health | Payer: Self-pay | Source: Home / Self Care | Attending: Internal Medicine

## 2018-12-28 DIAGNOSIS — R7881 Bacteremia: Secondary | ICD-10-CM

## 2018-12-28 DIAGNOSIS — I358 Other nonrheumatic aortic valve disorders: Secondary | ICD-10-CM

## 2018-12-28 DIAGNOSIS — I340 Nonrheumatic mitral (valve) insufficiency: Secondary | ICD-10-CM

## 2018-12-28 HISTORY — PX: TEE WITHOUT CARDIOVERSION: SHX5443

## 2018-12-28 LAB — HEPATITIS B SURFACE ANTIGEN: Hepatitis B Surface Ag: NEGATIVE

## 2018-12-28 LAB — COMPREHENSIVE METABOLIC PANEL
ALT: 56 U/L — ABNORMAL HIGH (ref 0–44)
AST: 42 U/L — ABNORMAL HIGH (ref 15–41)
Albumin: 2.3 g/dL — ABNORMAL LOW (ref 3.5–5.0)
Alkaline Phosphatase: 176 U/L — ABNORMAL HIGH (ref 38–126)
Anion gap: 11 (ref 5–15)
BUN: 36 mg/dL — ABNORMAL HIGH (ref 6–20)
CO2: 18 mmol/L — ABNORMAL LOW (ref 22–32)
Calcium: 8.5 mg/dL — ABNORMAL LOW (ref 8.9–10.3)
Chloride: 103 mmol/L (ref 98–111)
Creatinine, Ser: 3.08 mg/dL — ABNORMAL HIGH (ref 0.44–1.00)
GFR calc Af Amer: 19 mL/min — ABNORMAL LOW (ref >60)
GFR calc non Af Amer: 16 mL/min — ABNORMAL LOW (ref >60)
Glucose, Bld: 220 mg/dL — ABNORMAL HIGH (ref 70–99)
Potassium: 4.9 mmol/L (ref 3.5–5.1)
Sodium: 132 mmol/L — ABNORMAL LOW (ref 135–145)
Total Bilirubin: 1.1 mg/dL (ref 0.3–1.2)
Total Protein: 6.7 g/dL (ref 6.5–8.1)

## 2018-12-28 LAB — GLUCOSE, CAPILLARY
Glucose-Capillary: 189 mg/dL — ABNORMAL HIGH (ref 70–99)
Glucose-Capillary: 175 mg/dL — ABNORMAL HIGH (ref 70–99)
Glucose-Capillary: 239 mg/dL — ABNORMAL HIGH (ref 70–99)
Glucose-Capillary: 166 mg/dL — ABNORMAL HIGH (ref 70–99)

## 2018-12-28 LAB — HEPATITIS C ANTIBODY (REFLEX): HCV Ab: 0.2 s/co ratio (ref 0.0–0.9)

## 2018-12-28 LAB — BRAIN NATRIURETIC PEPTIDE: B Natriuretic Peptide: 601.9 pg/mL — ABNORMAL HIGH (ref 0.0–100.0)

## 2018-12-28 LAB — HCV COMMENT:

## 2018-12-28 SURGERY — ECHOCARDIOGRAM, TRANSESOPHAGEAL
Anesthesia: General

## 2018-12-28 MED ORDER — SODIUM CHLORIDE 0.9 % IV SOLN
INTRAVENOUS | Status: DC
  Administered 2018-12-28: 10:00:00 via INTRAVENOUS

## 2018-12-28 MED ORDER — FUROSEMIDE 10 MG/ML IJ SOLN
INTRAMUSCULAR | Status: AC
  Filled 2018-12-28: qty 8

## 2018-12-28 MED ORDER — CEFAZOLIN SODIUM-DEXTROSE 2-4 GM/100ML-% IV SOLN
2.0000 g | Freq: Two times a day (BID) | INTRAVENOUS | Status: DC
  Administered 2018-12-28 – 2018-12-31 (×7): 2 g via INTRAVENOUS
  Filled 2018-12-28 (×7): qty 100

## 2018-12-28 MED ORDER — BUTAMBEN-TETRACAINE-BENZOCAINE 2-2-14 % EX AERO
INHALATION_SPRAY | CUTANEOUS | Status: DC | PRN
  Administered 2018-12-28: 2 via TOPICAL

## 2018-12-28 MED ORDER — SUCCINYLCHOLINE CHLORIDE 20 MG/ML IJ SOLN
INTRAMUSCULAR | Status: DC | PRN
  Administered 2018-12-28: 80 mg via INTRAVENOUS

## 2018-12-28 MED ORDER — ALBUTEROL SULFATE (2.5 MG/3ML) 0.083% IN NEBU
INHALATION_SOLUTION | RESPIRATORY_TRACT | Status: AC
  Filled 2018-12-28: qty 3

## 2018-12-28 MED ORDER — FUROSEMIDE 10 MG/ML IJ SOLN
80.0000 mg | Freq: Once | INTRAMUSCULAR | Status: AC
  Administered 2018-12-28: 80 mg via INTRAVENOUS

## 2018-12-28 MED ORDER — PROPOFOL 500 MG/50ML IV EMUL
INTRAVENOUS | Status: DC | PRN
  Administered 2018-12-28: 100 ug/kg/min via INTRAVENOUS

## 2018-12-28 MED ORDER — SODIUM CHLORIDE 0.9 % IV SOLN
INTRAVENOUS | Status: DC
  Administered 2018-12-28: 09:00:00 via INTRAVENOUS

## 2018-12-28 MED ORDER — ONDANSETRON HCL 4 MG/2ML IJ SOLN
INTRAMUSCULAR | Status: DC | PRN
  Administered 2018-12-28: 4 mg via INTRAVENOUS

## 2018-12-28 MED ORDER — PROPOFOL 10 MG/ML IV BOLUS
INTRAVENOUS | Status: DC | PRN
  Administered 2018-12-28: 60 mg via INTRAVENOUS

## 2018-12-28 MED ORDER — FUROSEMIDE 10 MG/ML IJ SOLN
60.0000 mg | Freq: Two times a day (BID) | INTRAMUSCULAR | Status: DC
  Administered 2018-12-28 – 2018-12-31 (×6): 60 mg via INTRAVENOUS
  Filled 2018-12-28 (×6): qty 6

## 2018-12-28 MED ORDER — LIDOCAINE HCL (CARDIAC) PF 100 MG/5ML IV SOSY
PREFILLED_SYRINGE | INTRAVENOUS | Status: DC | PRN
  Administered 2018-12-28: 60 mg via INTRAVENOUS

## 2018-12-28 NOTE — Progress Notes (Signed)
[place] arrival to recovery room, pt oxygen level in 70's. CRNA at bedside and Rachel Postin MD called and made aware. Pt. Sat up in bed and placed on non-rebreather mask with O2 levels coming up to 90's. Moser advised to give albuterol neb. Pt sitting up A&O receiving albuterol treatment at this time.

## 2018-12-28 NOTE — Anesthesia Preprocedure Evaluation (Addendum)
Anesthesia Evaluation  Patient identified by MRN, date of birth, ID band Patient awake    Reviewed: Allergy & Precautions, NPO status , Patient's Chart, lab work & pertinent test results  History of Anesthesia Complications Negative for: history of anesthetic complications  Airway Mallampati: II  TM Distance: >3 FB Neck ROM: Full    Dental  (+) Dental Advisory Given   Pulmonary shortness of breath,    breath sounds clear to auscultation       Cardiovascular hypertension, +CHF   Rhythm:Regular + Systolic murmurs    Neuro/Psych PSYCHIATRIC DISORDERS Anxiety    GI/Hepatic GERD  Medicated and Controlled,  Endo/Other  diabetesMorbid obesity  Renal/GU CRFRenal disease     Musculoskeletal  (+) Arthritis ,   Abdominal   Peds  Hematology  (+) anemia ,   Anesthesia Other Findings   Reproductive/Obstetrics                            Anesthesia Physical Anesthesia Plan  ASA: III  Anesthesia Plan: MAC   Post-op Pain Management:    Induction: Intravenous  PONV Risk Score and Plan: 2 and Treatment may vary due to age or medical condition and Propofol infusion  Airway Management Planned: Nasal Cannula  Additional Equipment: None  Intra-op Plan:   Post-operative Plan:   Informed Consent: I have reviewed the patients History and Physical, chart, labs and discussed the procedure including the risks, benefits and alternatives for the proposed anesthesia with the patient or authorized representative who has indicated his/her understanding and acceptance.     Dental advisory given  Plan Discussed with: CRNA and Surgeon  Anesthesia Plan Comments:         Anesthesia Quick Evaluation

## 2018-12-28 NOTE — Progress Notes (Signed)
[name] will follow pt with ID team to support home infusion pharmacy needs at DC if home IV ABX are needed.  If patient discharges after hours, please call [phone].   Rachel Holmes 12/28/2018, 6:58 AM

## 2018-12-28 NOTE — Progress Notes (Signed)
After receiving breathing treatment, pt O2 levels at 84% on [place]. Pt placed on simple mask with O2 levels coming up to low 90s.[name]

## 2018-12-28 NOTE — Progress Notes (Addendum)
PT Cancellation Note  Patient Details Name: Rachel Holmes MRN: [id number] DOB: 1961/08/30   Cancelled Treatment:    Reason Eval/Treat Not Completed: (P) Medical issues which prohibited therapy(Pt post TEE where she required intubation and extubation, now on BIPAP.  Not appropriate for PT today.  Will f/u per POC.  )   [name] Eli Hose 12/28/2018, 4:43 PM Shakim Faith Stann Mainland, PTA Acute Rehabilitation Services Pager [phone] Office [phone]

## 2018-12-28 NOTE — Anesthesia Postprocedure Evaluation (Signed)
Anesthesia Post Note  Patient: Rachel Holmes  Procedure(s) Performed: TRANSESOPHAGEAL ECHOCARDIOGRAM (TEE) (N/A ) BUBBLE STUDY     Patient location during evaluation: Endoscopy Anesthesia Type: General Level of consciousness: awake and alert Pain management: pain level controlled Vital Signs Assessment: post-procedure vital signs reviewed and stable Respiratory status: spontaneous breathing and patient connected to face mask oxygen Cardiovascular status: stable Postop Assessment: no apparent nausea or vomiting Anesthetic complications: no    Last Vitals:  Vitals:   12/28/18 1437 12/28/18 1743  BP: (!) 151/75 139/62  Pulse:    Resp:  20  Temp:  36.8 C  SpO2:  93%    Last Pain:  Vitals:   12/28/18 1743  TempSrc: Oral  PainSc:                  Akaash Vandewater

## 2018-12-28 NOTE — Interval H&P Note (Signed)
History and Physical Interval Note:  12/28/2018 9:21 AM  Rachel Holmes  has presented today for surgery, with the diagnosis of BACTEREMIA  The various methods of treatment have been discussed with the patient and family. After consideration of risks, benefits and other options for treatment, the patient has consented to  Procedure(s): TRANSESOPHAGEAL ECHOCARDIOGRAM (TEE) (N/A) as a surgical intervention .  The patient's history has been reviewed, patient examined, no change in status, stable for surgery.  I have reviewed the patient's chart and labs.  Questions were answered to the patient's satisfaction.     Fransico Him

## 2018-12-28 NOTE — CV Procedure (Signed)
    PROCEDURE NOTE:  Procedure:  Transesophageal echocardiogram Operator:  Fransico Him, MD Indications:  bacteremia Complications: None  During this procedure the patient is administered a total of Propofol 120 mg to achieve and 60mg  Lidocaine.  Patient was intubated per anesthesia due to hypoxemia,   The patient's heart rate, blood pressure, and oxygen saturation are monitored continuously during the procedure by anesthesia.   Results: Normal LV size and function Normal RV size and function Normal RA Normal LA and LA appendage with no evidence of thrombus Normal TV with mild TR Normal PV with trivial PR Normal MV with mild MR Trileaflet AV with a mobile shaggy density that appear to eminate off the Aortic valve annulus in the vicinity of the Right coronary cusp on the aortic side consistent with vegetation.There is trivial AR.  Normal interatrial septum with no evidence of shunt by colorflow dopper and agitated saline contrast Normal thoracic and ascending aorta.  Impression: Shaggy mobile density on the lateral aortic side of the right coronary cusp at the aortic annulus consistent with vegetation.  There is trivial AR.   The patient tolerated the procedure well and was transferred back to their room in stable condition.  Signed: Fransico Him, MD [facility]

## 2018-12-28 NOTE — Anesthesia Procedure Notes (Signed)
Procedure Name: Intubation Date/Time: 12/28/2018 10:45 AM Performed by: Kyung Rudd, CRNA Pre-anesthesia Checklist: Patient identified, Emergency Drugs available, Suction available, Patient being monitored and Timeout performed Patient Re-evaluated:Patient Re-evaluated prior to induction Oxygen Delivery Method: Circle system utilized Preoxygenation: Pre-oxygenation with 100% oxygen Induction Type: IV induction and Cricoid Pressure applied Laryngoscope Size: Mac and 4 Grade View: Grade I Tube type: Oral Tube size: 7.0 mm Number of attempts: 1 Airway Equipment and Method: Stylet Placement Confirmation: ETT inserted through vocal cords under direct vision,  positive ETCO2 and breath sounds checked- equal and bilateral Secured at: 21 cm Tube secured with: Tape Dental Injury: Teeth and Oropharynx as per pre-operative assessment

## 2018-12-28 NOTE — Progress Notes (Signed)
Late entry  Upon extubation in the PACU post TEE-patient developed hypoxic resp failure.  Suspicion for CHF-placed on BiPAP-given IV Lasix and transferred back to the floor  O/e-comfortable on Bipap Has bibasilar rales  Imp: Probable decompensated diastolic CHF ?Aspiration  Plan: Continue Bipap-wean when able If worsens-then change Abx to cover Aspiration

## 2018-12-28 NOTE — Progress Notes (Addendum)
  Echocardiogram Echocardiogram Transesophageal has been performed.  Rachel Holmes 12/28/2018, 11:21 AM

## 2018-12-28 NOTE — Progress Notes (Signed)
[facility] for Infectious Disease  Date of Admission:  12/24/2018     Total days of antibiotics 4 Ceftriaxone          Patient ID: Rachel Holmes is a 58 y.o. female with  Principal Problem:   Bacteremia due to Streptococcus Active Problems:   Charcot ankle, left   Diabetes ([place])   Essential hypertension   CKD (chronic kidney disease) stage 4, GFR 15-29 ml/min (HCC)   Acute renal failure superimposed on stage 3 chronic kidney disease (HCC)   Cellulitis in diabetic foot (HCC)   Streptococcus infection   Aortic valve endocarditis   . amLODipine  10 mg Oral Daily  . aspirin EC  325 mg Oral Daily  . cloNIDine  0.3 mg Oral BID  . docusate sodium  100 mg Oral BID  . Dulaglutide  1.5 mg Subcutaneous Q Sun  . DULoxetine  30 mg Oral Daily  . gabapentin  600 mg Oral QHS  . heparin  5,000 Units Subcutaneous Q8H  . insulin aspart  0-15 Units Subcutaneous TID WC  . insulin aspart  0-5 Units Subcutaneous QHS  . isosorbide mononitrate  120 mg Oral Daily  . pantoprazole  40 mg Oral Daily  . repaglinide  1 mg Oral TID AC  . sodium bicarbonate  650 mg Oral BID    SUBJECTIVE: Just back from endoscopy from TEE. Having some trouble with wheezing/dyspnea and shortness of breath. Her husband is in the room - aware of positive TEE results. Report improvement to the erythema/swelling of the LLE/ankle.   Review of Systems: Review of Systems  All other systems reviewed and are negative.   Allergies  Allergen Reactions  . Statins Shortness Of Breath    Wheezing, short of breath  . Codeine Nausea Only  . Ibuprofen Other (See Comments)    Reaction:  Raises pts BP  . Penicillins Hives and Other (See Comments)    Has patient had a PCN reaction causing immediate rash, facial/tongue/throat swelling, SOB or lightheadedness with hypotension: No Has patient had a PCN reaction causing severe rash involving mucus membranes or skin necrosis: No Has patient had a PCN reaction that  required hospitalization No Has patient had a PCN reaction occurring within the last 10 years: No If all of the above answers are "NO", then may proceed with Cephalosporin use.  [name] Percocet [Oxycodone-Acetaminophen] Nausea Only  . Tramadol Nausea Only    Can take if she has eaten  . Vicodin [Hydrocodone-Acetaminophen] Nausea Only    OBJECTIVE: Vitals:   12/28/18 1140 12/28/18 1155 12/28/18 1206 12/28/18 1235  BP: (!) 183/63 (!) 154/59 (!) 154/63   Pulse: (!) 102     Resp: (!) 29  (!) 25 (!) 22  Temp:      TempSrc:      SpO2: 93%  92% 95%  Weight:      Height:       Body mass index is 50.87 kg/m.  Physical Exam Constitutional:      Appearance: She is obese. She is not diaphoretic.  Cardiovascular:     Rate and Rhythm: Regular rhythm. Tachycardia present.     Heart sounds: No murmur.  Pulmonary:     Breath sounds: Wheezing and rhonchi present.     Comments: Sitting upright in bed with FM for oxygen. Able to converse without difficulty.  R < L breath sounds.  Musculoskeletal:        General: Swelling present.  Comments: RLE with pitting edema still; erythema extending above ankle dressings with warmth.   Skin:    General: Skin is warm and dry.     Capillary Refill: Capillary refill takes less than 2 seconds.  Neurological:     General: No focal deficit present.     Mental Status: She is alert.  Psychiatric:        Mood and Affect: Mood normal.     Lab Results Lab Results  Component Value Date   WBC 11.4 (H) 12/27/2018   HGB 8.2 (L) 12/27/2018   HCT 26.7 (L) 12/27/2018   MCV 93.0 12/27/2018   PLT 153 12/27/2018    Lab Results  Component Value Date   CREATININE 3.08 (H) 12/28/2018   BUN 36 (H) 12/28/2018   NA 132 (L) 12/28/2018   K 4.9 12/28/2018   CL 103 12/28/2018   CO2 18 (L) 12/28/2018    Lab Results  Component Value Date   ALT 56 (H) 12/28/2018   AST 42 (H) 12/28/2018   ALKPHOS 176 (H) 12/28/2018   BILITOT 1.1 12/28/2018      Microbiology: Recent Results (from the past 240 hour(s))  Group A Strep by PCR     Status: None   Collection Time: 12/23/18  9:38 AM  Result Value Ref Range Status   Group A Strep by PCR NOT DETECTED NOT DETECTED Final    Comment: Performed at [facility], [address] [place], [place] [address]  Culture, blood (single)     Status: Abnormal   Collection Time: 12/23/18  9:40 AM  Result Value Ref Range Status   Specimen Description BLOOD LEFT HAND  Final   Special Requests   Final    BOTTLES DRAWN AEROBIC AND ANAEROBIC Blood Culture adequate volume   Culture  Setup Time   Final    AEROBIC BOTTLE ONLY GRAM POSITIVE COCCI Organism ID to follow CRITICAL RESULT CALLED TO, READ BACK BY AND VERIFIED WITH: K.NEIL,RN AT 1025 ON 12/24/18 BY G.[email or web address] Performed at [facility], [place] [address], [place], [place] [address]    Culture STREPTOCOCCUS GROUP G (A)  Final   Report Status 12/27/2018 FINAL  Final   Organism ID, Bacteria STREPTOCOCCUS GROUP G  Final      Susceptibility   Streptococcus group g - MIC*    CLINDAMYCIN <=0.25 SENSITIVE Sensitive     AMPICILLIN <=0.25 SENSITIVE Sensitive     ERYTHROMYCIN <=0.12 SENSITIVE Sensitive     VANCOMYCIN 0.5 SENSITIVE Sensitive     CEFTRIAXONE <=0.12 SENSITIVE Sensitive     LEVOFLOXACIN 0.5 SENSITIVE Sensitive     * STREPTOCOCCUS GROUP G  Blood Culture ID Panel (Reflexed)     Status: Abnormal   Collection Time: 12/23/18  9:40 AM  Result Value Ref Range Status   Enterococcus species NOT DETECTED NOT DETECTED Final   Listeria monocytogenes NOT DETECTED NOT DETECTED Final   Staphylococcus species NOT DETECTED NOT DETECTED Final   Staphylococcus aureus (BCID) NOT DETECTED NOT DETECTED Final   Streptococcus species DETECTED (A) NOT DETECTED Final    Comment: Not Enterococcus species, Streptococcus agalactiae, Streptococcus pyogenes, or Streptococcus pneumoniae. CRITICAL RESULT CALLED TO, READ BACK BY AND VERIFIED WITH: K.NEIL,RN AT  0247 ON 12/24/18 BY G.MCADOO    Streptococcus agalactiae NOT DETECTED NOT DETECTED Final   Streptococcus pneumoniae NOT DETECTED NOT DETECTED Final   Streptococcus pyogenes NOT DETECTED NOT DETECTED Final   Acinetobacter baumannii NOT DETECTED NOT DETECTED Final   Enterobacteriaceae species  NOT DETECTED NOT DETECTED Final   Enterobacter cloacae complex NOT DETECTED NOT DETECTED Final   Escherichia coli NOT DETECTED NOT DETECTED Final   Klebsiella oxytoca NOT DETECTED NOT DETECTED Final   Klebsiella pneumoniae NOT DETECTED NOT DETECTED Final   Proteus species NOT DETECTED NOT DETECTED Final   Serratia marcescens NOT DETECTED NOT DETECTED Final   Haemophilus influenzae NOT DETECTED NOT DETECTED Final   Neisseria meningitidis NOT DETECTED NOT DETECTED Final   Pseudomonas aeruginosa NOT DETECTED NOT DETECTED Final   Candida albicans NOT DETECTED NOT DETECTED Final   Candida glabrata NOT DETECTED NOT DETECTED Final   Candida krusei NOT DETECTED NOT DETECTED Final   Candida parapsilosis NOT DETECTED NOT DETECTED Final   Candida tropicalis NOT DETECTED NOT DETECTED Final    Comment: Performed at [facility], [place] [address] [place], [place] [address]  Blood culture (routine x 2)     Status: None (Preliminary result)   Collection Time: 12/24/18 12:53 PM  Result Value Ref Range Status   Specimen Description BLOOD RIGHT HAND  Final   Special Requests   Final    BOTTLES DRAWN AEROBIC AND ANAEROBIC Blood Culture results may not be optimal due to an inadequate volume of blood received in culture bottles Performed at [facility] [address], [place], [place] [address]    Culture NO GROWTH 3 DAYS  Final   Report Status PENDING  Incomplete  Blood culture (routine x 2)     Status: None (Preliminary result)   Collection Time: 12/24/18 12:58 PM  Result Value Ref Range Status   Specimen Description BLOOD LEFT HAND  Final   Special Requests   Final    BOTTLES DRAWN AEROBIC AND  ANAEROBIC Blood Culture adequate volume Performed at [facility], [place] [address], [place], [place] [address]    Culture NO GROWTH 3 DAYS  Final   Report Status PENDING  Incomplete  Urine Culture     Status: None   Collection Time: 12/25/18 11:57 AM  Result Value Ref Range Status   Specimen Description URINE, RANDOM  Final   Special Requests NONE  Final   Culture   Final    NO GROWTH Performed at [facility], [place] [address], [place], [place] [address]    Report Status 12/26/2018 FINAL  Final   ASSESSMENT: LAPORSCHA LINEHAN is a 58 y.o. diabetic female with chronic foot ulcer of the left lateral ankle, Charcot arthropathy admitted with Group G streptococcal bacteremia. She has now been found to have likely endocarditis involving the aortic valve - would recommend formal TCTS evaluation to ensure no need for valve surgery.   Diabetes, A1C >9%  PLAN: 1. Group G streptococcal bacteremia = TEE c/w small AoV vegetation, likely primary source of infection was chronic ulcer on the left leg. Repeated blood cultures negative 2/20. OK to place long-term vascular access now - likely needs tunneled catheter with CrCl < 40 mL/min. Will change ceftriaxone to PCN infusion.   2. AV Endocarditis, native valve = Would recommend TCTS evaluation; will need prolonged 6 week course of therapy.   3. SOB/Wheezing = she has had trouble with hypoxia/sob since her TEE; lasix, bipap and cxr per primary managing team.   4. Medication monitoring = creatinine stable. Monitor CBC while on PCN therapy   Janene Madeira, MSN, NP-C [facility] [place] Pager: [phone]  12/28/2018  1:59 PM

## 2018-12-28 NOTE — Transfer of Care (Signed)
Immediate Anesthesia Transfer of Care Note  Patient: Rachel Holmes  Procedure(s) Performed: TRANSESOPHAGEAL ECHOCARDIOGRAM (TEE) (N/A ) BUBBLE STUDY  Patient Location: Endoscopy Unit  Anesthesia Type:General  Level of Consciousness: awake, alert  and oriented  Airway & Oxygen Therapy: Patient Spontanous Breathing and Patient connected to face mask oxygen  Post-op Assessment: Report given to RN  Post vital signs: Reviewed and stable  Last Vitals:  Vitals Value Taken Time  BP 176/101 12/28/2018 11:11 AM  Temp    Pulse 105 12/28/2018 11:15 AM  Resp 27 12/28/2018 11:15 AM  SpO2 99 % 12/28/2018 11:15 AM  Vitals shown include unvalidated device data.  Last Pain:  Vitals:   12/28/18 1111  TempSrc:   PainSc: 0-No pain      Patients Stated Pain Goal: 2 (54/62/70 3500)  Complications: No apparent anesthesia complications

## 2018-12-28 NOTE — Telephone Encounter (Signed)
Post ED Visit - Positive Culture Follow-up: Successful Patient Follow-Up  Culture assessed and recommendations reviewed by:  []  Elenor Quinones, Pharm.D. []  Heide Guile, Pharm.D., BCPS AQ-ID []  Parks Neptune, Pharm.D., BCPS []  Alycia Rossetti, Pharm.D., BCPS []  [place], [place].D., BCPS, AAHIVP []  Legrand Como, Pharm.D., BCPS, AAHIVP []  Salome Arnt, PharmD, BCPS []  Johnnette Gourd, PharmD, BCPS []  Hughes Better, PharmD, BCPS []  Leeroy Cha, PharmD  Elicia Lamp PharmD Positive blood culture  []  Patient discharged without antimicrobial prescription and treatment is now indicated []  Organism is resistant to prescribed ED discharge antimicrobial [x]  Patient with positive blood cultures  Currently inpatient @ [facility]  Hazle Nordmann 12/28/2018, 10:11 AM

## 2018-12-28 NOTE — Progress Notes (Signed)
Call received from Dr. Sloan Leiter, advised of patient respiratory status. Orders placed by Dr. Sloan Leiter for Lasix 80mg  IV now, STAT CXR, and patient may be placed on bipap on her return to the floor. Will give lasix and obtain Chest xray in endo recovery and then transport patient back to [facility]22. Dr. Doretha Imus will see patient when she returns to 5[facility]. Will call for him to see her sooner if any change. Call placed to Dr. Ermalene Postin, advised of plan he is agreeable.

## 2018-12-28 NOTE — Progress Notes (Signed)
PROGRESS NOTE        PATIENT DETAILS Name: Rachel Holmes Age: 58 y.o. Sex: female Date of Birth: [birth year] Admit Date: 12/24/2018 Admitting Physician Barb Merino, MD [id number], Rubbie Battiest, MD  Brief Narrative: Patient is a 57 y.o. female-2, hypertension, peripheral neuropathy, Charcot joint involving left  foot presenting with 3-4-day history of fever, myalgias, left leg swelling-evaluated in the ED on 2/19, blood cultures drawn and subsequently sent home.  Brought back to the ED after blood cultures were positive for Streptococcus.  See below for further details.  Subjective: Lying comfortably in bed-complains that she thinks she may have gained some fluid.  Left leg swelling/erythema has improved-claims that her left leg is always more chronically swollen than her right-she thinks that her left leg is very close to usual baseline this morning.  Assessment/Plan: Sepsis secondary to group G streptococcal bacteremia with possible aortic valve endocarditis and left leg soft tissue infection: Sepsis physiology has improved, leukocytosis has almost resolved, continues to have intermittent low-grade fever.  Left leg has markedly improved-Per patient is now close to usual baseline-very minimal tenderness and erythema this morning.  Lower extremity Doppler was negative, MRI without any obvious abscess.  TTE with possible aortic valve vegetation.  Awaiting TEE today.  Thankfully repeat blood cultures on 2/20 are negative.  Will await further recommendations from infectious disease.   AKI on CKD stage IV: AKI hemodynamically mediated-creatinine slowly downtrending-but seems to have plateaued around the 3.0 range.  Appears to be slightly volume overloaded today-we will give 1 dose of IV Lasix.  Follow electrolytes.    Acute on chronic diastolic heart failure: Some mild edema in her lower extremities, few bibasilar rales-we will give 1 dose of IV Lasix today.  No longer on IV  fluids.  Hypertension: Blood pressure fluctuating-on the higher side-currently out of the unit for TEE-follow for now-continue amlodipine, clonidine and Imdur.  If blood pressure continues to be on the higher side-we will start adjusting medications-we will get 1 dose of IV Lasix today.  Follow.  DM-2: CBGs stable-continue moderate scale SSI, Prandin and Trulicity.  Follow and optimize.    Left leg ulceration: Present prior to admission-continue wound care per wound care team.  History of Charcot arthropathy  DVT Prophylaxis: Prophylactic Heparin   Code Status: Full code   Family Communication: None at bedside  Disposition Plan: Remain inpatient  Antimicrobial agents: Anti-infectives (From admission, onward)   Start     Dose/Rate Route Frequency Ordered Stop   12/25/18 1400  ceFEPIme (MAXIPIME) 1 g in sodium chloride 0.9 % 100 mL IVPB  Status:  Discontinued     1 g 200 mL/hr over 30 Minutes Intravenous Every 24 hours 12/24/18 1529 12/24/18 1551   12/24/18 1600  [MAR Hold]  cefTRIAXone (ROCEPHIN) 2 g in sodium chloride 0.9 % 100 mL IVPB     (MAR Hold since Mon 12/28/2018 at 0900. Reason: Transfer to a Procedural area.)   2 g 200 mL/hr over 30 Minutes Intravenous Every 24 hours 12/24/18 1551     12/24/18 1400  ceFEPIme (MAXIPIME) 1 g in sodium chloride 0.9 % 100 mL IVPB     1 g 200 mL/hr over 30 Minutes Intravenous  Once 12/24/18 1254 12/24/18 1613   12/24/18 1400  vancomycin (VANCOCIN) 2,500 mg in sodium chloride 0.9 % 500 mL IVPB  2,500 mg 250 mL/hr over 120 Minutes Intravenous  Once 12/24/18 1311 12/24/18 2007      Procedures: None  CONSULTS:  ID  Time spent: 25- minutes-Greater than 50% of this time was spent in counseling, explanation of diagnosis, planning of further management, and coordination of care.  MEDICATIONS: Scheduled Meds: . [MAR Hold] amLODipine  10 mg Oral Daily  . [MAR Hold] aspirin EC  325 mg Oral Daily  . [MAR Hold] cloNIDine  0.3 mg Oral BID   . [MAR Hold] docusate sodium  100 mg Oral BID  . [MAR Hold] Dulaglutide  1.5 mg Subcutaneous Q Sun  . [MAR Hold] DULoxetine  30 mg Oral Daily  . [MAR Hold] gabapentin  600 mg Oral QHS  . [MAR Hold] heparin  5,000 Units Subcutaneous Q8H  . [MAR Hold] insulin aspart  0-15 Units Subcutaneous TID WC  . [MAR Hold] insulin aspart  0-5 Units Subcutaneous QHS  . [MAR Hold] isosorbide mononitrate  120 mg Oral Daily  . [MAR Hold] pantoprazole  40 mg Oral Daily  . [MAR Hold] repaglinide  1 mg Oral TID AC  . [MAR Hold] sodium bicarbonate  650 mg Oral BID   Continuous Infusions: . [START ON 12/29/2018] sodium chloride    . sodium chloride 20 mL/hr at 12/28/18 0928  . [MAR Hold] cefTRIAXone (ROCEPHIN)  IV 2 g (12/26/18 2141)   PRN Meds:.[MAR Hold] acetaminophen, [MAR Hold] albuterol, [MAR Hold] traMADol   PHYSICAL EXAM: Vital signs: Vitals:   12/27/18 2043 12/28/18 0517 12/28/18 0630 12/28/18 0909  BP: (!) 168/53 (!) 153/57  (!) 182/67  Pulse: 95 (!) 101  92  Resp:  16  16  Temp: 99.4 F (37.4 C) (!) 100.8 F (38.2 C) 99.8 F (37.7 C) 99.1 F (37.3 C)  TempSrc: Oral Axillary Oral Oral  SpO2:    93%  Weight:      Height:       Filed Weights   12/24/18 1500 12/27/18 1920  Weight: (!) 138.8 kg (!) 143 kg   Body mass index is 50.87 kg/m.   General appearance:Awake, alert, not in any distress.  Eyes:no scleral icterus. HEENT: Atraumatic and Normocephalic Neck: supple, no JVD. Resp:Good air entry bilaterally, bibasilar rales CVS: S1 S2 regular GI: Bowel sounds present, Non tender and not distended with no gaurding, rigidity or rebound. Extremities: Left leg with mild erythema-very minimal tenderness-compartments still are very soft.  Left leg is still swollen compared to the right. Neurology:  Non focal Psychiatric: Normal judgment and insight. Normal mood. Musculoskeletal:No digital cyanosis Skin:No Rash, warm and dry Wounds:N/A  I have personally reviewed following labs and  imaging studies  LABORATORY DATA: CBC: Recent Labs  Lab 12/23/18 0943 12/24/18 1253 12/25/18 0342 12/26/18 0446 12/27/18 0512  WBC 10.8* 18.0* 15.5* 11.0* 11.4*  NEUTROABS 9.7*  --   --   --   --   HGB 11.1* 9.3* 8.3* 8.8* 8.2*  HCT 35.8* 30.9* 27.0* 28.3* 26.7*  MCV 93.7 94.8 92.5 93.1 93.0  PLT 223 153 146* 149* 790    Basic Metabolic Panel: Recent Labs  Lab 12/24/18 1253 12/25/18 0342 12/26/18 0446 12/27/18 0512 12/28/18 0512  NA 131* 132* 135 133* 132*  K 5.7* 4.5 4.6 4.6 4.9  CL 101 103 104 104 103  CO2 18* 19* 20* 17* 18*  GLUCOSE 314* 146* 137* 213* 220*  BUN 43* 41* 37* 37* 36*  CREATININE 3.43* 3.28* 3.24* 3.03* 3.08*  CALCIUM 7.8* 7.5* 8.2* 8.1* 8.5*  GFR: Estimated Creatinine Clearance: 29.5 mL/min (A) (by C-G formula based on SCr of 3.08 mg/dL (H)).  Liver Function Tests: Recent Labs  Lab 12/23/18 0943 12/24/18 1253 12/28/18 0512  AST 69* 145* 42*  ALT 39 133* 56*  ALKPHOS 116 99 176*  BILITOT 0.8 0.8 1.1  PROT 8.0 6.1* 6.7  ALBUMIN 3.9 2.6* 2.3*   No results for input(s): LIPASE, AMYLASE in the last 168 hours. No results for input(s): AMMONIA in the last 168 hours.  Coagulation Profile: No results for input(s): INR, PROTIME in the last 168 hours.  Cardiac Enzymes: No results for input(s): CKTOTAL, CKMB, CKMBINDEX, TROPONINI in the last 168 hours.  BNP (last 3 results) No results for input(s): PROBNP in the last 8760 hours.  HbA1C: No results for input(s): HGBA1C in the last 72 hours.  CBG: Recent Labs  Lab 12/27/18 0819 12/27/18 1240 12/27/18 1659 12/27/18 2040 12/28/18 0749  GLUCAP 160* 177* 181* 199* 189*    Lipid Profile: No results for input(s): CHOL, HDL, LDLCALC, TRIG, CHOLHDL, LDLDIRECT in the last 72 hours.  Thyroid Function Tests: No results for input(s): TSH, T4TOTAL, FREET4, T3FREE, THYROIDAB in the last 72 hours.  Anemia Panel: No results for input(s): VITAMINB12, FOLATE, FERRITIN, TIBC, IRON, RETICCTPCT  in the last 72 hours.  Urine analysis:    Component Value Date/Time   COLORURINE YELLOW 12/23/2018 1145   APPEARANCEUR CLEAR 12/23/2018 1145   LABSPEC 1.012 12/23/2018 1145   PHURINE 6.0 12/23/2018 1145   GLUCOSEU NEGATIVE 12/23/2018 1145   HGBUR NEGATIVE 12/23/2018 1145   BILIRUBINUR NEGATIVE 12/23/2018 1145   KETONESUR NEGATIVE 12/23/2018 1145   PROTEINUR 100 (A) 12/23/2018 1145   NITRITE NEGATIVE 12/23/2018 1145   LEUKOCYTESUR TRACE (A) 12/23/2018 1145    Sepsis Labs: Lactic Acid, Venous    Component Value Date/Time   LATICACIDVEN 2.1 ([place]) 12/23/2018 0943    MICROBIOLOGY: Recent Results (from the past 240 hour(s))  Group A Strep by PCR     Status: None   Collection Time: 12/23/18  9:38 AM  Result Value Ref Range Status   Group A Strep by PCR NOT DETECTED NOT DETECTED Final    Comment: Performed at [facility], [place] [address], [place], [place] [address]  Culture, blood (single)     Status: Abnormal   Collection Time: 12/23/18  9:40 AM  Result Value Ref Range Status   Specimen Description BLOOD LEFT HAND  Final   Special Requests   Final    BOTTLES DRAWN AEROBIC AND ANAEROBIC Blood Culture adequate volume   Culture  Setup Time   Final    AEROBIC BOTTLE ONLY GRAM POSITIVE COCCI Organism ID to follow CRITICAL RESULT CALLED TO, READ BACK BY AND VERIFIED WITH: K.NEIL,RN AT 1884 ON 12/24/18 BY G.MCADOO Performed at [facility], [place] [address] [place], [place] [address]    Culture STREPTOCOCCUS GROUP G (A)  Final   Report Status 12/27/2018 FINAL  Final   Organism ID, Bacteria STREPTOCOCCUS GROUP G  Final      Susceptibility   Streptococcus group g - MIC*    CLINDAMYCIN <=0.25 SENSITIVE Sensitive     AMPICILLIN <=0.25 SENSITIVE Sensitive     ERYTHROMYCIN <=0.12 SENSITIVE Sensitive     VANCOMYCIN 0.5 SENSITIVE Sensitive     CEFTRIAXONE <=0.12 SENSITIVE Sensitive     LEVOFLOXACIN 0.5 SENSITIVE Sensitive     * STREPTOCOCCUS GROUP G  Blood Culture ID Panel  (Reflexed)     Status: Abnormal  Collection Time: 12/23/18  9:40 AM  Result Value Ref Range Status   Enterococcus species NOT DETECTED NOT DETECTED Final   Listeria monocytogenes NOT DETECTED NOT DETECTED Final   Staphylococcus species NOT DETECTED NOT DETECTED Final   Staphylococcus aureus (BCID) NOT DETECTED NOT DETECTED Final   Streptococcus species DETECTED (A) NOT DETECTED Final    Comment: Not Enterococcus species, Streptococcus agalactiae, Streptococcus pyogenes, or Streptococcus pneumoniae. CRITICAL RESULT CALLED TO, READ BACK BY AND VERIFIED WITH: K.NEIL,RN AT 0247 ON 12/24/18 BY G.MCADOO    Streptococcus agalactiae NOT DETECTED NOT DETECTED Final   Streptococcus pneumoniae NOT DETECTED NOT DETECTED Final   Streptococcus pyogenes NOT DETECTED NOT DETECTED Final   Acinetobacter baumannii NOT DETECTED NOT DETECTED Final   Enterobacteriaceae species NOT DETECTED NOT DETECTED Final   Enterobacter cloacae complex NOT DETECTED NOT DETECTED Final   Escherichia coli NOT DETECTED NOT DETECTED Final   Klebsiella oxytoca NOT DETECTED NOT DETECTED Final   Klebsiella pneumoniae NOT DETECTED NOT DETECTED Final   Proteus species NOT DETECTED NOT DETECTED Final   Serratia marcescens NOT DETECTED NOT DETECTED Final   Haemophilus influenzae NOT DETECTED NOT DETECTED Final   Neisseria meningitidis NOT DETECTED NOT DETECTED Final   Pseudomonas aeruginosa NOT DETECTED NOT DETECTED Final   Candida albicans NOT DETECTED NOT DETECTED Final   Candida glabrata NOT DETECTED NOT DETECTED Final   Candida krusei NOT DETECTED NOT DETECTED Final   Candida parapsilosis NOT DETECTED NOT DETECTED Final   Candida tropicalis NOT DETECTED NOT DETECTED Final    Comment: Performed at [facility], [place]. [address], [place], [place] [address]  Blood culture (routine x 2)     Status: None (Preliminary result)   Collection Time: 12/24/18 12:53 PM  Result Value Ref Range Status   Specimen Description BLOOD RIGHT  HAND  Final   Special Requests   Final    BOTTLES DRAWN AEROBIC AND ANAEROBIC Blood Culture results may not be optimal due to an inadequate volume of blood received in culture bottles Performed at [place] [address], [place], [place] [address]    Culture NO GROWTH 3 DAYS  Final   Report Status PENDING  Incomplete  Blood culture (routine x 2)     Status: None (Preliminary result)   Collection Time: 12/24/18 12:58 PM  Result Value Ref Range Status   Specimen Description BLOOD LEFT HAND  Final   Special Requests   Final    BOTTLES DRAWN AEROBIC AND ANAEROBIC Blood Culture adequate volume Performed at [facility], [place] [address], [place], [place] [address]    Culture NO GROWTH 3 DAYS  Final   Report Status PENDING  Incomplete  Urine Culture     Status: None   Collection Time: 12/25/18 11:57 AM  Result Value Ref Range Status   Specimen Description URINE, RANDOM  Final   Special Requests NONE  Final   Culture   Final    NO GROWTH Performed at [facility], [place] [address], [place], [place] [address]    Report Status 12/26/2018 FINAL  Final    RADIOLOGY STUDIES/RESULTS: Dg Chest 2 View  Result Date: 12/23/2018 CLINICAL DATA:  Fever and chills EXAM: CHEST - 2 VIEW COMPARISON:  February 03, 2017 FINDINGS: The lungs are clear. The heart size and pulmonary vascularity are normal. No adenopathy. There is degenerative change in the lower thoracic spine. IMPRESSION: No edema or consolidation. Electronically Signed   By: Lowella Grip  III M.D.   On: 12/23/2018 10:14   Dg Knee 1-2 Views Right  Result Date: 12/02/2018 CLINICAL DATA:  RIGHT knee pain and swelling for 2 weeks post fall EXAM: RIGHT KNEE - 1-2 VIEW COMPARISON:  12/31/2017 FINDINGS: Osseous demineralization. Tricompartmental osteoarthritic changes with joint space narrowing and spur formation. Mild subchondral cyst formation at medial compartment, which demonstrates the greatest degree of narrowing. No acute  fracture, dislocation, or bone destruction. No knee joint effusion. Scattered atherosclerotic calcifications. IMPRESSION: Tricompartmental osteoarthritic changes of the RIGHT knee greatest at medial compartment. Electronically Signed   By: Lavonia Dana M.D.   On: 12/02/2018 16:18   US Renal  Result Date: 12/26/2018 CLINICAL DATA:  Acute kidney injury. EXAM: RENAL / URINARY TRACT ULTRASOUND COMPLETE COMPARISON:  None. FINDINGS: Right Kidney: Renal measurements: 10.1 x 4.0 x 5.6 cm = volume: 120 mL. Increased echogenicity in the right kidney without hydronephrosis. There are at least 2 small hypoechoic cysts in the right kidney, largest measuring 1.4 cm. No suspicious renal lesion. Cortical thinning. Left Kidney: Renal measurements: 11.1 x 6.5 x 5.4 cm = volume: 203 mL. Normal echogenicity. Negative for hydronephrosis. Hypoechoic cyst in the lower pole measuring up to 2.2 cm. Cortical thinning. Bladder: Appears normal for degree of bladder distention. IMPRESSION: 1. Negative for hydronephrosis. 2. Cortical thinning in both kidneys. Increased echogenicity in the right kidney. Findings may represent chronic medical renal disease. 3. Bilateral renal cysts. Electronically Signed   By: Markus Daft M.D.   On: 12/26/2018 14:36   Mr Tibia Fibula Left Wo Contrast  Result Date: 12/25/2018 CLINICAL DATA:  Diabetic patient with left lower leg pain and swelling. Chronic wound over the lateral malleolus of the left ankle. EXAM: MRI OF LOWER LEFT EXTREMITY WITHOUT CONTRAST TECHNIQUE: Multiplanar, multisequence MR imaging of the left lower leg was performed. No intravenous contrast was administered. COMPARISON:  None. FINDINGS: Bones/Joint/Cartilage Marrow edema in the distal 5 cm of the tibia and fibula is identified as seen on the patient's ankle MRI yesterday. Marrow signal is otherwise normal. Ligaments Negative. Muscles and Tendons There is mild fatty atrophy of intrinsic musculature of the lower leg. Intermediate  increased T2 signal in all lower leg musculature is compatible with diabetic myopathy/denervation. A small amount of fluid is seen interposed between the soleus and medial gastrocnemius. No intramuscular fluid collection. No muscle tear or strain. Soft tissues Intense subcutaneous edema about the lower legs bilaterally appears worse on the left. IMPRESSION: Marrow edema in the distal 5 cm of the tibia and fibula as seen on MRI yesterday could be secondary to stress change or osteomyelitis. Marrow signal is otherwise normal. Intense subcutaneous edema in the lower legs bilaterally is worse on the left and compatible with dependent change and/or cellulitis. Small volume of fluid interposed between the medial head of gastrocnemius and soleus could be due to dependent change or infection. Intermediate increased T2 signal in all lower leg musculature is most consistent with diabetic myopathy and denervation. No intramuscular fluid collection. Electronically Signed   By: Inge Rise M.D.   On: 12/25/2018 12:03   Mr Ankle Left Wo Contrast  Result Date: 12/25/2018 CLINICAL DATA:  Cellulitis with swelling bacteremia of the foot. EXAM: MRI OF THE LEFT ANKLE WITHOUT CONTRAST TECHNIQUE: Multiplanar, multisequence MR imaging of the ankle was performed. No intravenous contrast was administered. COMPARISON:  CT scan 09/16/2018 FINDINGS: TENDONS Peroneal: Thickened common peroneus tendon sheath posterior to the lateral malleolus. Moderate peroneus longus tendinopathy. Posteromedial: Prominent distal tibialis posterior tendinopathy. Ill  definition the flexor digitorum longus in the vicinity of the knot of Henry suggesting partial tearing. Anterior: Tendinopathy of the tibialis anterior and extensor hallucis longus. Achilles: Fusiform thickening and accentuated signal in the distal Achilles tendon compatible with mild tendinopathy. Plantar Fascia: Abnormal thickening of the medial band of the plantar fascia with adjacent  edema with plantar fasciitis. LIGAMENTS Lateral: The talus is dislocated, and thus the talofibular ligaments are torn. The inferior tibiofibular ligaments are indistinct. The calcaneofibular ligament is indistinct. There is some marrow edema in the calcaneus near the expected attachment site of the calcaneofibular ligament. Medial: The medial malleolus is thought to have fragmented. Amorphous soft tissue in the expected location of the tibiospring and tibionavicular portions of the deltoid ligament. Obviously the deep tibiotalar portion of the deltoid ligament is completely torn. CARTILAGE Ankle Joint and subtalar joint: Part of the talus, possibly constituting a portion of the talar dome, is dislocated about 4.5 cm anteriorly from its expected location, and demonstrates regions of low T2 signal possibly from osteonecrosis. Most of the talus S collapsed or necrosis, and in the expected location of the talus there is a 4.3 by 3.0 by 3.3 cm debris-filled cavity. The tibia partially pseudo articulates with the calcaneus, and there is resorption of much of the upper calcaneus as well. Bones: Continuing the list of osseous abnormalities in this case, there is scalloping and thinning of the anterior calcaneus full long within eroded cuboid bone, as well as fragmentation and collapse of the middle and lateral cuneiform spur with severe irregularity, spurring, and erosions along the Lisfranc joint at the second through fifth digits. A bony fragment from the middle cuneiform may have fused in with the medial cuneiform. I favor that most of the findings seen in this case are neuropathic due to Charcot hindfoot and midfoot. If the patient has an occult draining sinus tract in the may be an indicator of a component of infection. Other: Extensive subcutaneous edema along the foot which may be reactive or due to cellulitis. IMPRESSION: 1. Severe Charcot arthropathy of the hindfoot and midfoot, with dislocation of the talus;  destruction of much of the talus; resorption/destruction of the middle and lateral cuneiforms; extensive erosion of the anterior calcaneus and portions of the cuboid; extensive erosions along the bases of the metatarsals; interval fragmentation of the medial malleolus; and concomitant ligamentous disruptions. 2. Diffuse subcutaneous edema in the ankle may be reactive or due to cellulitis. 3. Flexor and extensor tendinopathy is noted above. 4. Achilles tendinopathy distally. 5. Plantar fasciitis. Electronically Signed   By: Van Clines M.D.   On: 12/25/2018 08:27   [name] Limited Joint Space Structures Low Right  Result Date: 12/18/2018 No images saved   Vas [name] Lower Extremity Venous (dvt)  Result Date: 12/25/2018  Lower Venous Study Indications: Swelling.  Limitations: Body habitus. Performing Technologist: Antonieta Pert RDMS, RVT  Examination Guidelines: A complete evaluation includes B-mode imaging, spectral Doppler, color Doppler, and power Doppler as needed of all accessible portions of each vessel. Bilateral testing is considered an integral part of a complete examination. Limited examinations for reoccurring indications may be performed as noted.  Right Venous Findings: +---+---------------+---------+-----------+----------+-------+    CompressibilityPhasicitySpontaneityPropertiesSummary +---+---------------+---------+-----------+----------+-------+ CFVFull           Yes      Yes                          +---+---------------+---------+-----------+----------+-------+  Left Venous Findings: +---------+---------------+---------+-----------+----------+--------------+  CompressibilityPhasicitySpontaneityPropertiesSummary        +---------+---------------+---------+-----------+----------+--------------+ CFV      Full           Yes      Yes                                 +---------+---------------+---------+-----------+----------+--------------+ SFJ      Full                                                         +---------+---------------+---------+-----------+----------+--------------+ FV Prox  Full                                                        +---------+---------------+---------+-----------+----------+--------------+ FV Mid   Full                                                        +---------+---------------+---------+-----------+----------+--------------+ FV DistalFull                                                        +---------+---------------+---------+-----------+----------+--------------+ PFV      Full                                                        +---------+---------------+---------+-----------+----------+--------------+ POP      Full           Yes      Yes                                 +---------+---------------+---------+-----------+----------+--------------+ PTV                                                   Not visualized +---------+---------------+---------+-----------+----------+--------------+ PERO                                                  Not visualized +---------+---------------+---------+-----------+----------+--------------+ GSV      Full                                                        +---------+---------------+---------+-----------+----------+--------------+  Summary: Right: No evidence of common femoral vein obstruction. Left: There is no evidence of deep vein thrombosis in the lower extremity. However, portions of this examination were limited- see technologist comments above. No cystic structure found in the popliteal fossa. Prominent lymph nodes noted, largest measuring 3.2cm  *See table(s) above for measurements and observations. Electronically signed by Deitra Mayo MD on 12/25/2018 at 2:35:28 PM.    Final      LOS: 4 days   Oren Binet, MD  [facility]  If 7PM-7AM, please contact night-coverage  Please page via  [email or web address]  Go to [email or web address] and use 's universal password to access. If you do not have the password, please contact the hospital operator.  Locate the [facility] provider you are looking for under [facility] and page to a number that you can be directly reached. If you still have difficulty reaching the provider, please page the [facility] (Director on Call) for the Hospitalists listed on amion for assistance.  12/28/2018, 11:05 AM

## 2018-12-28 NOTE — Progress Notes (Signed)
Call placed to Dr. Ermalene Postin, advised unable to transition to nasal cannula, advised lung sounds unchanged after albuterol treatment. Per Dr. Ermalene Postin may need to consider bipap and transfer to stepdown. Paged Ghimire from hospital team to discuss. Awaiting call back from Dr. Sloan Leiter at this time. Sats 93% on simple mask 10L, RR 32.

## 2018-12-29 ENCOUNTER — Inpatient Hospital Stay (HOSPITAL_COMMUNITY): Payer: Medicare HMO

## 2018-12-29 ENCOUNTER — Encounter (HOSPITAL_COMMUNITY): Payer: Self-pay | Admitting: Interventional Radiology

## 2018-12-29 DIAGNOSIS — I339 Acute and subacute endocarditis, unspecified: Secondary | ICD-10-CM

## 2018-12-29 DIAGNOSIS — A491 Streptococcal infection, unspecified site: Secondary | ICD-10-CM

## 2018-12-29 DIAGNOSIS — E11628 Type 2 diabetes mellitus with other skin complications: Secondary | ICD-10-CM

## 2018-12-29 DIAGNOSIS — I350 Nonrheumatic aortic (valve) stenosis: Secondary | ICD-10-CM

## 2018-12-29 DIAGNOSIS — N184 Chronic kidney disease, stage 4 (severe): Secondary | ICD-10-CM

## 2018-12-29 DIAGNOSIS — L03119 Cellulitis of unspecified part of limb: Secondary | ICD-10-CM

## 2018-12-29 HISTORY — PX: IR FLUORO GUIDE CV LINE RIGHT: IMG2283

## 2018-12-29 HISTORY — PX: IR US GUIDE VASC ACCESS RIGHT: IMG2390

## 2018-12-29 LAB — BASIC METABOLIC PANEL
Anion gap: 13 (ref 5–15)
BUN: 41 mg/dL — ABNORMAL HIGH (ref 6–20)
CO2: 20 mmol/L — ABNORMAL LOW (ref 22–32)
Calcium: 8.5 mg/dL — ABNORMAL LOW (ref 8.9–10.3)
Chloride: 100 mmol/L (ref 98–111)
Creatinine, Ser: 3.06 mg/dL — ABNORMAL HIGH (ref 0.44–1.00)
GFR calc Af Amer: 19 mL/min — ABNORMAL LOW (ref >60)
GFR calc non Af Amer: 16 mL/min — ABNORMAL LOW (ref >60)
Glucose, Bld: 186 mg/dL — ABNORMAL HIGH (ref 70–99)
Potassium: 4.4 mmol/L (ref 3.5–5.1)
Sodium: 133 mmol/L — ABNORMAL LOW (ref 135–145)

## 2018-12-29 LAB — CULTURE, BLOOD (ROUTINE X 2)
Culture: NO GROWTH
Culture: NO GROWTH
Special Requests: ADEQUATE

## 2018-12-29 LAB — GLUCOSE, CAPILLARY
Glucose-Capillary: 185 mg/dL — ABNORMAL HIGH (ref 70–99)
Glucose-Capillary: 202 mg/dL — ABNORMAL HIGH (ref 70–99)
Glucose-Capillary: 115 mg/dL — ABNORMAL HIGH (ref 70–99)
Glucose-Capillary: 197 mg/dL — ABNORMAL HIGH (ref 70–99)

## 2018-12-29 LAB — CBC
HCT: 24.7 % — ABNORMAL LOW (ref 36.0–46.0)
Hemoglobin: 7.8 g/dL — ABNORMAL LOW (ref 12.0–15.0)
MCH: 29.7 pg (ref 26.0–34.0)
MCHC: 31.6 g/dL (ref 30.0–36.0)
MCV: 93.9 fL (ref 80.0–100.0)
Platelets: 190 10*3/uL (ref 150–400)
RBC: 2.63 MIL/uL — ABNORMAL LOW (ref 3.87–5.11)
RDW: 13.5 % (ref 11.5–15.5)
WBC: 10.1 10*3/uL (ref 4.0–10.5)
nRBC: 0 % (ref 0.0–0.2)

## 2018-12-29 MED ORDER — METRONIDAZOLE IN NACL 5-0.79 MG/ML-% IV SOLN: 500.0000 mg | Freq: Three times a day (TID) | INTRAVENOUS | Status: DC

## 2018-12-29 MED ORDER — LIDOCAINE HCL 1 % IJ SOLN
INTRAMUSCULAR | Status: DC | PRN
  Administered 2018-12-29: 10 mL

## 2018-12-29 MED ORDER — METRONIDAZOLE 500 MG PO TABS
500.0000 mg | ORAL_TABLET | Freq: Three times a day (TID) | ORAL | Status: DC
  Administered 2018-12-29 – 2018-12-31 (×5): 500 mg via ORAL
  Filled 2018-12-29 (×6): qty 1

## 2018-12-29 MED ORDER — LIDOCAINE HCL 1 % IJ SOLN
INTRAMUSCULAR | Status: AC
  Filled 2018-12-29: qty 20

## 2018-12-29 MED ORDER — HEPARIN SODIUM (PORCINE) 5000 UNIT/ML IJ SOLN
5000.0000 [IU] | Freq: Three times a day (TID) | INTRAMUSCULAR | Status: DC
  Administered 2018-12-30 – 2018-12-31 (×4): 5000 [IU] via SUBCUTANEOUS
  Filled 2018-12-29 (×3): qty 1

## 2018-12-29 NOTE — Consult Note (Addendum)
[address]       [place] [address]             [phone]        Shakisha J Fulwider [place] Medical Record #[id number] Date of Birth: Aug 22, 1961  Referring: No ref. provider found Primary Care: Sharyne Peach, MD Primary Cardiologist:No primary care provider on file.  Chief Complaint:   Aortic valve endocarditis, group G streptococcal bacteremia   History of Present Illness:    The patient is a 58 year old female with a history of a Charcot joint and chronic lateral malleolus pressure ulcer who presented to the hospital with flulike symptoms and fever for approximately 1 day.  She is known to have type 2 diabetes as well as hypertension and peripheral neuropathy.  She is been treated at [facility] by podiatry for approximately 3 months for this wound.  On 2/19 2020 she developed generalized body aches with associated lethargy and headache.  She also noted a temperature of 102 degrees.  She presented to the emergency room for further evaluation and treatment.  Her flu test was negative.  Her white blood cell count was in the normal range.  She was recommended supportive care and sent home.  Her symptoms did worsen over time with increasing weakness and body aches and blood cultures were done found to have grown Streptococcus.  She was called back to the emergency room for further evaluation including admission.  She was found to have cellulitis associated with her leg wound at that time.  Repeat white blood cell count was elevated at 18,000 and creatinine was also noted to be elevated at 3.43.  Is slightly above her baseline reportedly.  She was started on vancomycin and cefepime in the ER.  Infectious disease consultation has been obtained and antibiotics were changed to ceftriaxone pending sensitivities on her group A strep.  The also ordered an MRI of the tibia and fibula as well as 2D echocardiogram.  Echocardiogram has revealed a finding on the aortic valve  concerning for possible endocarditis.  Are asked to see the patient in cardiothoracic surgical consultation.  Patient did have worsening respiratory failure post TEE on 225 requiring BiPAP support and this was felt to be consistent with decompensated diastolic heart failure and aspiration pneumonia.  This was also associated with volume overload but she is responding to IV Lasix and is overall improving and her respiratory failure.    Current Activity/ Functional Status: Significant debilitation due to left ankle Charcot joint as well as morbid obesity   Zubrod Score: At the time of surgery this patient's most appropriate activity status/level should be described as: _0     0    Normal activity, no symptoms _1     1    Restricted in physical strenuous activity but ambulatory, able to do out light work _2     2    Ambulatory and capable of self care, unable to do work activities, up and about                 more than 50%  Of the time                            _3     3    Only limited self care, in bed greater than 50% of waking hours _4     4    Completely disabled, no self care, confined to  bed or chair _0     5    Moribund  Past Medical History:  Diagnosis Date  . Allergic rhinitis   . Anemia   . Anxiety   . Back pain   . Bradycardia   . Breast mass    Patient can no longer palpate specific masses but showed tech general area of concern  . CHF (congestive heart failure) ([place])   . CKD (chronic kidney disease)    STAGE 3  . Constipation   . Diabetes mellitus without complication ([place])   . Dyspnea   . GERD (gastroesophageal reflux disease)   . HLD (hyperlipidemia)   . Hyperparathyroidism ([place])   . Hypertension   . Joint pain   . Leg edema   . Legally blind in left eye, as defined in [place]   . Lymphedema   . Onychomycosis     Past Surgical History:  Procedure Laterality Date  . ABDOMINAL HYSTERECTOMY    . BREAST BIOPSY Left 2014   FNA 12:00 position - Negative  . EYE SURGERY     . TEE WITHOUT CARDIOVERSION N/A 12/28/2018   Procedure: TRANSESOPHAGEAL ECHOCARDIOGRAM (TEE);  Surgeon: Sueanne Margarita, MD;  Location: [facility]COPY;  Service: Cardiovascular;  Laterality: N/A;    Social History   Tobacco Use  Smoking Status Never Smoker  Smokeless Tobacco Never Used    Social History   Substance and Sexual Activity  Alcohol Use No     Allergies  Allergen Reactions  . Statins Shortness Of Breath    Wheezing, short of breath  . Codeine Nausea Only  . Ibuprofen Other (See Comments)    Reaction:  Raises pts BP  . Penicillins Hives and Other (See Comments)    Has patient had a PCN reaction causing immediate rash, facial/tongue/throat swelling, SOB or lightheadedness with hypotension: No Has patient had a PCN reaction causing severe rash involving mucus membranes or skin necrosis: No Has patient had a PCN reaction that required hospitalization No Has patient had a PCN reaction occurring within the last 10 years: No If all of the above answers are "NO", then may proceed with Cephalosporin use.  [name] Percocet [Oxycodone-Acetaminophen] Nausea Only  . Tramadol Nausea Only    Can take if she has eaten  . Vicodin [Hydrocodone-Acetaminophen] Nausea Only    Current Facility-Administered Medications  Medication Dose Route Frequency Provider Last Rate Last Dose  . acetaminophen (TYLENOL) tablet 650 mg  650 mg Oral Q6H PRN Lovey Newcomer T, NP   650 mg at 12/28/18 0527  . albuterol (PROVENTIL) (2.5 MG/3ML) 0.083% nebulizer solution 2.5 mg  2.5 mg Nebulization Q6H PRN Barb Merino, MD   2.5 mg at 12/28/18 1124  . amLODipine (NORVASC) tablet 10 mg  10 mg Oral Daily Barb Merino, MD   10 mg at 12/29/18 0917  . aspirin EC tablet 325 mg  325 mg Oral Daily Barb Merino, MD   325 mg at 12/29/18 0917  . ceFAZolin (ANCEF) IVPB 2g/100 mL premix  2 g Intravenous Q12H Carlyle Basques, MD 200 mL/hr at 12/29/18 0931 2 g at 12/29/18 0931  . cloNIDine (CATAPRES) tablet 0.3 mg  0.3 mg  Oral BID Barb Merino, MD   0.3 mg at 12/29/18 0916  . docusate sodium (COLACE) capsule 100 mg  100 mg Oral BID Schorr, Rhetta Mura, NP   100 mg at 12/29/18 1607  . Dulaglutide SOPN 1.5 mg  1.5 mg Subcutaneous Q Sun Ghimire, Henreitta Leber, MD   1.5 mg at 12/27/18  1503  . DULoxetine (CYMBALTA) DR capsule 30 mg  30 mg Oral Daily Barb Merino, MD   30 mg at 12/29/18 0919  . furosemide (LASIX) injection 60 mg  60 mg Intravenous BID Jonetta Osgood, MD   60 mg at 12/29/18 0803  . gabapentin (NEURONTIN) tablet 600 mg  600 mg Oral QHS Barb Merino, MD   600 mg at 12/28/18 2231  . [START ON 12/30/2018] heparin injection 5,000 Units  5,000 Units Subcutaneous Q8H Monia Sabal, PA-C      . insulin aspart (novoLOG) injection 0-15 Units  0-15 Units Subcutaneous TID WC Jonetta Osgood, MD   5 Units at 12/29/18 1212  . insulin aspart (novoLOG) injection 0-5 Units  0-5 Units Subcutaneous QHS Jonetta Osgood, MD   2 Units at 12/26/18 2149  . isosorbide mononitrate (IMDUR) 24 hr tablet 120 mg  120 mg Oral Daily Barb Merino, MD   120 mg at 12/29/18 0918  . metroNIDAZOLE (FLAGYL) tablet 500 mg  500 mg Oral Q8H Susa Raring, [place]      . pantoprazole (PROTONIX) EC tablet 40 mg  40 mg Oral Daily Barb Merino, MD   40 mg at 12/29/18 0917  . repaglinide (PRANDIN) tablet 1 mg  1 mg Oral TID Pilar Grammes, MD   1 mg at 12/29/18 1212  . sodium bicarbonate tablet 650 mg  650 mg Oral BID Jonetta Osgood, MD   650 mg at 12/29/18 1610  . traMADol (ULTRAM) tablet 50 mg  50 mg Oral Q6H PRN Barb Merino, MD   50 mg at 12/25/18 2150    Medications Prior to Admission  Medication Sig Dispense Refill Last Dose  . albuterol (PROVENTIL HFA;VENTOLIN HFA) 108 (90 Base) MCG/ACT inhaler Inhale 1-2 puffs into the lungs every 6 (six) hours as needed for wheezing or shortness of breath.   12/21/2018 at prn  . amLODipine (NORVASC) 10 MG tablet Take 10 mg by mouth daily.   12/23/2018 at Unknown time  . aspirin EC 325  MG tablet Take 325 mg by mouth daily.   12/23/2018 at Unknown time  . Blood Glucose Monitoring Suppl (ONE TOUCH ULTRA 2) w/Device KIT 1 Device by Does not apply route daily. 1 each 0 Taking  . cloNIDine (CATAPRES) 0.3 MG tablet Take 0.3 mg by mouth 2 (two) times daily.   12/23/2018 at Unknown time  . dapagliflozin propanediol (FARXIGA) 5 MG TABS tablet Take 5 mg by mouth daily. (Patient not taking: Reported on 12/23/2018) 30 tablet 11 Not Taking at Unknown time  . Dulaglutide (TRULICITY) 1.5 RU/0.4VW SOPN Inject 1.5 mg into the skin once a week. (Patient taking differently: Inject 1.5 mg into the skin once a week. sunday) 4 pen 11 12/20/2018  . DULoxetine (CYMBALTA) 30 MG capsule Take 30 mg by mouth daily.    12/23/2018 at Unknown time  . gabapentin (NEURONTIN) 600 MG tablet Take 600 mg by mouth at bedtime.   12/22/2018 at Unknown time  . glucose blood (ONETOUCH VERIO) test strip 1 each by Other route 2 (two) times daily. And lancets 1/day 100 each 5 Taking  . isosorbide mononitrate (IMDUR) 120 MG 24 hr tablet Take 120 mg by mouth daily.   12/23/2018 at Unknown time  . Lancets (ONETOUCH ULTRASOFT) lancets Used to check blood sugars four times daily. 200 each 12 Taking  . Multiple Vitamin (MULTIVITAMIN WITH MINERALS) TABS tablet Take 1 tablet by mouth daily.   12/23/2018 at Unknown time  . omeprazole (PRILOSEC)  40 MG capsule Take 40 mg by mouth 2 (two) times daily.   12/23/2018 at Unknown time  . repaglinide (PRANDIN) 1 MG tablet Take 1 tablet (1 mg total) by mouth 3 (three) times daily before meals. 90 tablet 11 12/23/2018 at Unknown time  . spironolactone (ALDACTONE) 50 MG tablet Take 50 mg by mouth daily.   Past Month at Unknown time  . traMADol (ULTRAM) 50 MG tablet Take 50 mg by mouth every 6 (six) hours as needed for moderate pain or severe pain (Pain). Take with food   12/23/2018 at Unknown time  . Vitamin D, Ergocalciferol, (DRISDOL) 1.25 MG (50000 UT) CAPS capsule Take 1 capsule (50,000 Units total) by  mouth every 7 (seven) days. (Patient not taking: Reported on 12/23/2018) 4 capsule 0 Not Taking at Unknown time    Family History  Problem Relation Age of Onset  . Breast cancer Sister 86  . Diabetes Sister   . Diabetes Mother   . Hypertension Mother   . Hyperlipidemia Mother   . Eating disorder Mother   . Obesity Mother      Review of Systems:   Review of Systems  Constitutional: Positive for chills, diaphoresis, fever and malaise/fatigue. Negative for weight loss.  HENT: Positive for ear pain. Negative for congestion, ear discharge, hearing loss, nosebleeds, sinus pain, sore throat and tinnitus.   Eyes: Positive for pain. Negative for blurred vision, double vision, photophobia, discharge and redness.       Left eye blindness  Respiratory: Positive for hemoptysis, shortness of breath and wheezing. Negative for cough, sputum production and stridor.        Some hemoptysis after TEE  Cardiovascular: Positive for chest pain, palpitations, orthopnea and leg swelling. Negative for claudication and PND.  Gastrointestinal: Positive for abdominal pain, blood in stool, constipation, heartburn, nausea and vomiting. Negative for diarrhea and melena.  Genitourinary: Positive for frequency and urgency. Negative for dysuria, flank pain and hematuria.  Musculoskeletal: Positive for joint pain and myalgias. Negative for back pain, falls and neck pain.  Skin: Negative for itching and rash.       Left ankle cellulitis  Neurological: Positive for dizziness, tingling, sensory change and headaches. Negative for tremors, speech change, focal weakness, seizures, loss of consciousness and weakness.  Endo/Heme/Allergies: Negative for environmental allergies. Bruises/bleeds easily.  Psychiatric/Behavioral: Positive for memory loss. Negative for depression, hallucinations, substance abuse and suicidal ideas. The patient has insomnia. The patient is not nervous/anxious.       Physical Exam: BP (!) 137/96 (BP  Location: Left Arm)   Pulse 92   Temp 98.4 F (36.9 C) (Oral)   Resp 20   Ht _0  (1.676 m)   Wt (!) 136.9 kg   SpO2 96%   BMI 48.71 kg/m    Physical Exam  Constitutional: She is oriented to person, place, and time. She appears well-developed and well-nourished. No distress.  Morbidly obese   HENT:  Head: Normocephalic and atraumatic.  Mouth/Throat: Oropharynx is clear and moist. No oropharyngeal exudate.  Full dentures   Eyes: Conjunctivae and EOM are normal. Right eye exhibits no discharge. Left eye exhibits no discharge. No scleral icterus.  Pupils poorly responsive to loght + left eye blindness  Neck: No JVD present. No tracheal deviation present. No thyromegaly present.  Cardiovascular: Normal rate and regular rhythm. Exam reveals no gallop and no friction rub.  Murmur heard. 2/6 systolic murmur Absent pedal pulses   Pulmonary/Chest: Effort normal and breath sounds normal. No stridor. No  respiratory distress. She has no wheezes. She has no rales. She exhibits no tenderness.  Abdominal: She exhibits no distension and no mass. There is no abdominal tenderness. There is no rebound and no guarding.  Obese   Genitourinary:    Genitourinary Comments: Not examined   Musculoskeletal:        General: Edema present.     Comments: left ankle charcot , dressing in place  Lymphadenopathy:    She has no cervical adenopathy.  Neurological: She is alert and oriented to person, place, and time.  Gait not tested  Skin: Skin is warm and dry. No rash noted. She is not diaphoretic. No erythema. No pallor.  Psychiatric: She has a normal mood and affect. Her behavior is normal. Judgment and thought content normal.    Diagnostic Studies & Laboratory data:     Recent Radiology Findings:   Dg Chest Port 1 View  Result Date: 12/29/2018 CLINICAL DATA:  Shortness of breath. EXAM: PORTABLE CHEST 1 VIEW COMPARISON:  12/28/2018 and 12/23/2018 FINDINGS: Again noted are bilateral airspace  densities. Most confluent airspace disease is located in the mid and lower right lung. Heart size appears to be enlarged and stable. Negative for a pneumothorax. Mild blunting at the costophrenic angles and difficult to exclude small pleural effusions. IMPRESSION: Minimal change in the bilateral airspace disease, right side greater than left. Differential diagnosis includes pulmonary edema and/or pneumonia. Question tiny pleural effusions. Electronically Signed   By: Markus Daft M.D.   On: 12/29/2018 09:24   Dg Chest Port 1v Same Day  Result Date: 12/28/2018 CLINICAL DATA:  Shortness of breath after transesophageal echo EXAM: PORTABLE CHEST 1 VIEW COMPARISON:  December 23, 2018 FINDINGS: There is new infiltrate in the right base. There is mild new opacity in left perihilar region and left base. No pneumothorax. No change in the cardiomediastinal silhouette. No other acute abnormalities. IMPRESSION: Bilateral pulmonary infiltrates worrisome for aspiration or pneumonia. Recommend clinical correlation and follow-up to resolution. Electronically Signed   By: Dorise Bullion III M.D   On: 12/28/2018 11:57     I have independently reviewed the above radiologic studies and discussed with the patient   Recent Lab Findings: Lab Results  Component Value Date   WBC 10.1 12/29/2018   HGB 7.8 (L) 12/29/2018   HCT 24.7 (L) 12/29/2018   PLT 190 12/29/2018   GLUCOSE 186 (H) 12/29/2018   CHOL 191 06/25/2018   TRIG 104 06/25/2018   HDL 47 06/25/2018   LDLCALC 123 (H) 06/25/2018   ALT 56 (H) 12/28/2018   AST 42 (H) 12/28/2018   NA 133 (L) 12/29/2018   K 4.4 12/29/2018   CL 100 12/29/2018   CREATININE 3.06 (H) 12/29/2018   BUN 41 (H) 12/29/2018   CO2 20 (L) 12/29/2018   TSH 1.170 03/11/2018   HGBA1C 7.6 (A) 11/25/2018   Recent Results (from the past 720 hour(s))  Group A Strep by PCR     Status: None   Collection Time: 12/23/18  9:38 AM  Result Value Ref Range Status   Group A Strep by PCR NOT  DETECTED NOT DETECTED Final    Comment: Performed at [facility], [address], [place], [place] [address]  Culture, blood (single)     Status: Abnormal   Collection Time: 12/23/18  9:40 AM  Result Value Ref Range Status   Specimen Description BLOOD LEFT HAND  Final   Special Requests   Final    BOTTLES DRAWN AEROBIC  AND ANAEROBIC Blood Culture adequate volume   Culture  Setup Time   Final    AEROBIC BOTTLE ONLY GRAM POSITIVE COCCI Organism ID to follow CRITICAL RESULT CALLED TO, READ BACK BY AND VERIFIED WITH: K.NEIL,RN AT 8937 ON 12/24/18 BY G.[email or web address] Performed at [facility], [place] [address], [place], [place] [address]    Culture STREPTOCOCCUS GROUP G (A)  Final   Report Status 12/27/2018 FINAL  Final   Organism ID, Bacteria STREPTOCOCCUS GROUP G  Final      Susceptibility   Streptococcus group g - MIC*    CLINDAMYCIN <=0.25 SENSITIVE Sensitive     AMPICILLIN <=0.25 SENSITIVE Sensitive     ERYTHROMYCIN <=0.12 SENSITIVE Sensitive     VANCOMYCIN 0.5 SENSITIVE Sensitive     CEFTRIAXONE <=0.12 SENSITIVE Sensitive     LEVOFLOXACIN 0.5 SENSITIVE Sensitive     * STREPTOCOCCUS GROUP G  Blood Culture ID Panel (Reflexed)     Status: Abnormal   Collection Time: 12/23/18  9:40 AM  Result Value Ref Range Status   Enterococcus species NOT DETECTED NOT DETECTED Final   Listeria monocytogenes NOT DETECTED NOT DETECTED Final   Staphylococcus species NOT DETECTED NOT DETECTED Final   Staphylococcus aureus (BCID) NOT DETECTED NOT DETECTED Final   Streptococcus species DETECTED (A) NOT DETECTED Final    Comment: Not Enterococcus species, Streptococcus agalactiae, Streptococcus pyogenes, or Streptococcus pneumoniae. CRITICAL RESULT CALLED TO, READ BACK BY AND VERIFIED WITH: K.NEIL,RN AT 0247 ON 12/24/18 BY G.MCADOO    Streptococcus agalactiae NOT DETECTED NOT DETECTED Final   Streptococcus pneumoniae NOT DETECTED NOT DETECTED Final   Streptococcus pyogenes NOT DETECTED NOT DETECTED  Final   Acinetobacter baumannii NOT DETECTED NOT DETECTED Final   Enterobacteriaceae species NOT DETECTED NOT DETECTED Final   Enterobacter cloacae complex NOT DETECTED NOT DETECTED Final   Escherichia coli NOT DETECTED NOT DETECTED Final   Klebsiella oxytoca NOT DETECTED NOT DETECTED Final   Klebsiella pneumoniae NOT DETECTED NOT DETECTED Final   Proteus species NOT DETECTED NOT DETECTED Final   Serratia marcescens NOT DETECTED NOT DETECTED Final   Haemophilus influenzae NOT DETECTED NOT DETECTED Final   Neisseria meningitidis NOT DETECTED NOT DETECTED Final   Pseudomonas aeruginosa NOT DETECTED NOT DETECTED Final   Candida albicans NOT DETECTED NOT DETECTED Final   Candida glabrata NOT DETECTED NOT DETECTED Final   Candida krusei NOT DETECTED NOT DETECTED Final   Candida parapsilosis NOT DETECTED NOT DETECTED Final   Candida tropicalis NOT DETECTED NOT DETECTED Final    Comment: Performed at [facility], [place]. [address] [place], [place] [address]  Blood culture (routine x 2)     Status: None (Preliminary result)   Collection Time: 12/24/18 12:53 PM  Result Value Ref Range Status   Specimen Description BLOOD RIGHT HAND  Final   Special Requests   Final    BOTTLES DRAWN AEROBIC AND ANAEROBIC Blood Culture results may not be optimal due to an inadequate volume of blood received in culture bottles   Culture   Final    NO GROWTH 4 DAYS Performed at [facility], [place] [address] [place], [place] [address]    Report Status PENDING  Incomplete  Blood culture (routine x 2)     Status: None (Preliminary result)   Collection Time: 12/24/18 12:58 PM  Result Value Ref Range Status   Specimen Description BLOOD LEFT HAND  Final   Special Requests   Final    BOTTLES DRAWN AEROBIC  AND ANAEROBIC Blood Culture adequate volume   Culture   Final    NO GROWTH 4 DAYS Performed at [place] [address], [place], [place] [address]    Report Status PENDING  Incomplete  Urine  Culture     Status: None   Collection Time: 12/25/18 11:57 AM  Result Value Ref Range Status   Specimen Description URINE, RANDOM  Final   Special Requests NONE  Final   Culture   Final    NO GROWTH Performed at [facility], [place] [address], [place], [place] [address]    Report Status 12/26/2018 FINAL  Final   Results: TEE Normal LV size and function Normal RV size and function Normal RA Normal LA and LA appendage with no evidence of thrombus Normal TV with mild TR Normal PV with trivial PR Normal MV with mild MR Trileaflet AV with a mobile shaggy density that appear to eminate off the Aortic valve annulus in the vicinity of the Right coronary cusp on the aortic side consistent with vegetation.There is trivial AR.  Normal interatrial septum with no evidence of shunt by colorflow dopper and agitated saline contrast Normal thoracic and ascending aorta.  Impression: Shaggy mobile density on the lateral aortic side of the right coronary cusp at the aortic annulus consistent with vegetation.  There is trivial AR.   The patient tolerated the procedure well and was transferred back to their room in stable condition.  Signed: Fransico Him, MD [facility]  Assessment / Plan: Aortic valve endocarditis Sepsis from group G streptococcal bacteremia Acute on chronic kidney disease stage IV Acute on chronic diastolic failure Hypertension Type 2 diabetes mellitus with severe insulin resistance, metabolic syndrome Left ankle ulceration/Charcot joint      Due to her risk factors it is also highly likely that she has coronary artery disease which would probably require further evaluation prior to proceeding with surgery.  She certainly would be considered high risk for any potential intervention.    Generally accepted indications for surgical treatment of endocarditis:  Valve abnormalities or regurgitation resulting in congestive heart failure Microorganisms that are not  controlled by antimicrobial therapy (fungal) Endocarditis leading to valve dehiscence, perforation, rupture or fistula or large perivalvular abscess, recurrent emboli Persistent vegetation or fever/bacteremia despite optimal treatment vegetations  that are mobile and larger then>10 mm in diameter on the mitral valve vegetations that are increasing in size despite antimicrobial therapy Mitral "kissing" vegetation  I  spent 55 minutes counseling the patient face to face.   John Giovanni, PA-C 12/29/2018 12:26 PM  Patient history reviewed , patient seen and examined. No indication for cardiac surgery I have seen and examined Rondel Jumbo and agree with the above assessment  and plan.  Grace Isaac MD [place] [phone] Office [phone] 12/29/2018 6:49 PM

## 2018-12-29 NOTE — Progress Notes (Signed)
   Patient Status: [facility] - In-pt  Assessment and Plan: Patient in need of venous access.   Tunneled central catheter placement 6 week antibiotics    ______________________________________________________________________   History of Present Illness: Rachel Holmes is a 58 y.o. female   Left leg;foot infection +BC; endocarditis Need 6 week antibiotic  Allergies and medications reviewed.   Review of Systems: A 12 point ROS discussed and pertinent positives are indicated in the HPI above.  All other systems are negative.   Vital Signs: BP (!) 137/96 (BP Location: Left Arm)   Pulse 92   Temp 98.4 F (36.9 C) (Oral)   Resp 20   Ht 5\' 6"  (1.676 m)   Wt (!) 301 lb 13 oz (136.9 kg)   SpO2 96%   BMI 48.71 kg/m   Physical Exam Vitals signs reviewed.  Cardiovascular:     Rate and Rhythm: Normal rate and regular rhythm.  Pulmonary:     Effort: Pulmonary effort is normal.     Breath sounds: Normal breath sounds.  Skin:    General: Skin is warm.  Neurological:     Mental Status: She is alert and oriented to person, place, and time.  Psychiatric:        Mood and Affect: Mood normal.        Behavior: Behavior normal.        Thought Content: Thought content normal.        Judgment: Judgment normal.      Imaging reviewed.   Labs:  COAGS: No results for input(s): INR, APTT in the last 8760 hours.  BMP: Recent Labs    12/26/18 0446 12/27/18 0512 12/28/18 0512 12/29/18 0514  NA 135 133* 132* 133*  K 4.6 4.6 4.9 4.4  CL 104 104 103 100  CO2 20* 17* 18* 20*  GLUCOSE 137* 213* 220* 186*  BUN 37* 37* 36* 41*  CALCIUM 8.2* 8.1* 8.5* 8.5*  CREATININE 3.24* 3.03* 3.08* 3.06*  GFRNONAA 15* 16* 16* 16*  GFRAA 17* 19* 19* 19*    Left leg infection; foot infection; surgery +BC Endocarditis Cr 3.06; CKD Needs 6 weeks antibiotics Scheduled for tunneled central catheter placement Pt is aware of procedure benefits and risks including but not limited  to Infection; bleeding; vessel damage Agreeable to proceed Consent signed in chart   Electronically Signed: Lavonia Drafts, PA-C 12/29/2018, 11:20 AM   I spent a total of 15 minutes in face to face in clinical consultation, greater than 50% of which was counseling/coordinating care for venous access.

## 2018-12-29 NOTE — Progress Notes (Addendum)
[facility]  Date of Admission:  12/24/2018     Total days of antibiotics 5 Ceftriaxone          Patient ID: Rachel Holmes is a 58 y.o. female with  Principal Problem:   Bacteremia due to Streptococcus Active Problems:   Charcot ankle, left   Diabetes ([place])   Essential hypertension   CKD (chronic kidney disease) stage 4, GFR 15-29 ml/min (HCC)   Acute renal failure superimposed on stage 3 chronic kidney disease (HCC)   Cellulitis in diabetic foot (HCC)   Streptococcus infection   Aortic valve endocarditis   . amLODipine  10 mg Oral Daily  . aspirin EC  325 mg Oral Daily  . cloNIDine  0.3 mg Oral BID  . docusate sodium  100 mg Oral BID  . Dulaglutide  1.5 mg Subcutaneous Q Sun  . DULoxetine  30 mg Oral Daily  . furosemide  60 mg Intravenous BID  . gabapentin  600 mg Oral QHS  . [START ON 12/30/2018] heparin  5,000 Units Subcutaneous Q8H  . insulin aspart  0-15 Units Subcutaneous TID WC  . insulin aspart  0-5 Units Subcutaneous QHS  . isosorbide mononitrate  120 mg Oral Daily  . lidocaine      . metroNIDAZOLE  500 mg Oral Q8H  . pantoprazole  40 mg Oral Daily  . repaglinide  1 mg Oral TID AC  . sodium bicarbonate  650 mg Oral BID    SUBJECTIVE: Up in chair today. Breathing better after diuretics and now on 4LPM [place]. She reports continued improvement of the LE with regards to pain/swelling and erythema.   Review of Systems: Review of Systems  All other systems reviewed and are negative.   Allergies  Allergen Reactions  . Statins Shortness Of Breath    Wheezing, short of breath  . Codeine Nausea Only  . Ibuprofen Other (See Comments)    Reaction:  Raises pts BP  . Penicillins Hives and Other (See Comments)    Has patient had a PCN reaction causing immediate rash, facial/tongue/throat swelling, SOB or lightheadedness with hypotension: No Has patient had a PCN reaction causing severe rash involving mucus membranes or skin  necrosis: No Has patient had a PCN reaction that required hospitalization No Has patient had a PCN reaction occurring within the last 10 years: No If all of the above answers are "NO", then may proceed with Cephalosporin use.  [name] Percocet [Oxycodone-Acetaminophen] Nausea Only  . Tramadol Nausea Only    Can take if she has eaten  . Vicodin [Hydrocodone-Acetaminophen] Nausea Only    OBJECTIVE: Vitals:   12/29/18 0007 12/29/18 0503 12/29/18 0713 12/29/18 0759  BP:  (!) 168/81  (!) 137/96  Pulse: 92 91  92  Resp: 18 (!) 21  20  Temp:  99.6 F (37.6 C)  98.4 F (36.9 C)  TempSrc:  Oral  Oral  SpO2: 92% 93%  96%  Weight:   (!) 136.9 kg   Height:       Body mass index is 48.71 kg/m.  Physical Exam Constitutional:      Appearance: She is obese. She is not diaphoretic.     Comments: Seated comfortably in recliner.    Cardiovascular:     Rate and Rhythm: Normal rate and regular rhythm.     Heart sounds: Murmur (2-6/3 systolic) present.  Pulmonary:     Breath sounds: No wheezing or rhonchi.  Comments: R < L breath sounds.  Musculoskeletal:        General: Swelling present.     Comments: RLE with pitting edema still; erythema extending above ankle dressings with warmth.   Skin:    General: Skin is warm and dry.     Capillary Refill: Capillary refill takes less than 2 seconds.  Neurological:     General: No focal deficit present.     Mental Status: She is alert.  Psychiatric:        Mood and Affect: Mood normal.     Lab Results Lab Results  Component Value Date   WBC 10.1 12/29/2018   HGB 7.8 (L) 12/29/2018   HCT 24.7 (L) 12/29/2018   MCV 93.9 12/29/2018   PLT 190 12/29/2018    Lab Results  Component Value Date   CREATININE 3.06 (H) 12/29/2018   BUN 41 (H) 12/29/2018   NA 133 (L) 12/29/2018   K 4.4 12/29/2018   CL 100 12/29/2018   CO2 20 (L) 12/29/2018    Lab Results  Component Value Date   ALT 56 (H) 12/28/2018   AST 42 (H) 12/28/2018   ALKPHOS 176 (H)  12/28/2018   BILITOT 1.1 12/28/2018     Microbiology: Recent Results (from the past 240 hour(s))  Group A Strep by PCR     Status: None   Collection Time: 12/23/18  9:38 AM  Result Value Ref Range Status   Group A Strep by PCR NOT DETECTED NOT DETECTED Final    Comment: Performed at [facility], [address] [place], [place] [address]  Culture, blood (single)     Status: Abnormal   Collection Time: 12/23/18  9:40 AM  Result Value Ref Range Status   Specimen Description BLOOD LEFT HAND  Final   Special Requests   Final    BOTTLES DRAWN AEROBIC AND ANAEROBIC Blood Culture adequate volume   Culture  Setup Time   Final    AEROBIC BOTTLE ONLY GRAM POSITIVE COCCI Organism ID to follow CRITICAL RESULT CALLED TO, READ BACK BY AND VERIFIED WITH: K.NEIL,RN AT 7096 ON 12/24/18 BY G.[email or web address] Performed at [facility], [place] [address], [place], [place] [address]    Culture STREPTOCOCCUS GROUP G (A)  Final   Report Status 12/27/2018 FINAL  Final   Organism ID, Bacteria STREPTOCOCCUS GROUP G  Final      Susceptibility   Streptococcus group g - MIC*    CLINDAMYCIN <=0.25 SENSITIVE Sensitive     AMPICILLIN <=0.25 SENSITIVE Sensitive     ERYTHROMYCIN <=0.12 SENSITIVE Sensitive     VANCOMYCIN 0.5 SENSITIVE Sensitive     CEFTRIAXONE <=0.12 SENSITIVE Sensitive     LEVOFLOXACIN 0.5 SENSITIVE Sensitive     * STREPTOCOCCUS GROUP G  Blood Culture ID Panel (Reflexed)     Status: Abnormal   Collection Time: 12/23/18  9:40 AM  Result Value Ref Range Status   Enterococcus species NOT DETECTED NOT DETECTED Final   Listeria monocytogenes NOT DETECTED NOT DETECTED Final   Staphylococcus species NOT DETECTED NOT DETECTED Final   Staphylococcus aureus (BCID) NOT DETECTED NOT DETECTED Final   Streptococcus species DETECTED (A) NOT DETECTED Final    Comment: Not Enterococcus species, Streptococcus agalactiae, Streptococcus pyogenes, or Streptococcus pneumoniae. CRITICAL RESULT CALLED TO, READ  BACK BY AND VERIFIED WITH: K.NEIL,RN AT 0247 ON 12/24/18 BY G.MCADOO    Streptococcus agalactiae NOT DETECTED NOT DETECTED Final   Streptococcus pneumoniae NOT DETECTED NOT DETECTED Final  Streptococcus pyogenes NOT DETECTED NOT DETECTED Final   Acinetobacter baumannii NOT DETECTED NOT DETECTED Final   Enterobacteriaceae species NOT DETECTED NOT DETECTED Final   Enterobacter cloacae complex NOT DETECTED NOT DETECTED Final   Escherichia coli NOT DETECTED NOT DETECTED Final   Klebsiella oxytoca NOT DETECTED NOT DETECTED Final   Klebsiella pneumoniae NOT DETECTED NOT DETECTED Final   Proteus species NOT DETECTED NOT DETECTED Final   Serratia marcescens NOT DETECTED NOT DETECTED Final   Haemophilus influenzae NOT DETECTED NOT DETECTED Final   Neisseria meningitidis NOT DETECTED NOT DETECTED Final   Pseudomonas aeruginosa NOT DETECTED NOT DETECTED Final   Candida albicans NOT DETECTED NOT DETECTED Final   Candida glabrata NOT DETECTED NOT DETECTED Final   Candida krusei NOT DETECTED NOT DETECTED Final   Candida parapsilosis NOT DETECTED NOT DETECTED Final   Candida tropicalis NOT DETECTED NOT DETECTED Final    Comment: Performed at [facility], [place] [address], [place], [place] [address]  Blood culture (routine x 2)     Status: None   Collection Time: 12/24/18 12:53 PM  Result Value Ref Range Status   Specimen Description BLOOD RIGHT HAND  Final   Special Requests   Final    BOTTLES DRAWN AEROBIC AND ANAEROBIC Blood Culture results may not be optimal due to an inadequate volume of blood received in culture bottles   Culture   Final    NO GROWTH 5 DAYS Performed at [facility], [place] [address] [place], [place] [address]    Report Status 12/29/2018 FINAL  Final  Blood culture (routine x 2)     Status: None   Collection Time: 12/24/18 12:58 PM  Result Value Ref Range Status   Specimen Description BLOOD LEFT HAND  Final   Special Requests   Final    BOTTLES DRAWN AEROBIC  AND ANAEROBIC Blood Culture adequate volume   Culture   Final    NO GROWTH 5 DAYS Performed at [facility], [place] [address], [place],  [address]    Report Status 12/29/2018 FINAL  Final  Urine Culture     Status: None   Collection Time: 12/25/18 11:57 AM  Result Value Ref Range Status   Specimen Description URINE, RANDOM  Final   Special Requests NONE  Final   Culture   Final    NO GROWTH Performed at [facility], [place] [address], [place],  [address]    Report Status 12/26/2018 FINAL  Final   ASSESSMENT: HENSLEE LOTTMAN is a 58 y.o. diabetic female with chronic foot ulcer of the left lateral ankle, Charcot arthropathy admitted with Group G streptococcal bacteremia. She has now been found to have likely endocarditis involving the aortic valve - would recommend formal TCTS evaluation to ensure no need for valve surgery.   Diabetes, A1C >9%  PLAN: 1. Group G streptococcal bacteremia = TEE c/w small AoV vegetation, likely primary source of infection was chronic ulcer on the left leg. Repeated blood cultures negative 2/20. OK to place long-term vascular access now - likely needs tunneled catheter with CrCl < 40 mL/min. Continue Cefazolin.   2. AV Endocarditis, native valve = she has a distinct systolic murmur on exam today that was not as evident yesterday with previous lung noise. CT surgery has seen her and recommend continuing with antibiotics. Will plan to re-eval with TTE at the end of therapy and ensure she follows with cardiology. 4-6 weeks of cefazolin.   3.  SOB/Wheezing = acute [facility] following TEE +/- PNA contributing. Improved today following 3L diuresis with lasix, bipap however persistent opacities concerning for pneumonia/pneumonitis. Follow clinical improvement.   4. Medication monitoring = creatinine stable. OPAT as described below.   OPAT ORDERS:  Diagnosis: Group G strep endocarditis  Culture Result: Group G  Strep  Allergies  Allergen Reactions  . Statins Shortness Of Breath    Wheezing, short of breath  . Codeine Nausea Only  . Ibuprofen Other (See Comments)    Reaction:  Raises pts BP  . Penicillins Hives and Other (See Comments)    Has patient had a PCN reaction causing immediate rash, facial/tongue/throat swelling, SOB or lightheadedness with hypotension: No Has patient had a PCN reaction causing severe rash involving mucus membranes or skin necrosis: No Has patient had a PCN reaction that required hospitalization No Has patient had a PCN reaction occurring within the last 10 years: No If all of the above answers are "NO", then may proceed with Cephalosporin use.  [name] Percocet [Oxycodone-Acetaminophen] Nausea Only  . Tramadol Nausea Only    Can take if she has eaten  . Vicodin [Hydrocodone-Acetaminophen] Nausea Only    Discharge antibiotics: Cefazolin 2 gm IV q8h  Duration: 4-6 weeks  End Date: 02/11/2019  [facility] Care and Maintenance Per Protocol _x_ Please pull PIC at completion of IV antibiotics __ Please leave PIC in place until doctor has seen patient or been notified  Labs weekly while on IV antibiotics: _x_ CBC with differential _x_ BMP __ BMP TWICE WEEKLY** __ CMP __ CRP __ ESR __ Vancomycin trough  Fax weekly labs to [phone]  Clinic Follow Up Appt: 3 weeks - March 16th @ 8:45 am with Dr. Glori Bickers, MSN, NP-C [facility] [place] Pager: [phone]  12/29/2018  2:30 PM

## 2018-12-29 NOTE — Progress Notes (Signed)
Endocarditis Evaluation   Infective endocarditis (IE) is associated with a high rate of complications and mortality.  Specific aspects of clinical management are critical to optimizing the outcome of patients with IE. Therefore, the Pharmacy Residency Program at [facility] has initiated an evidence-based consult aimed at improving the management of IE at [facility].     Comments  Consult to ID Outpatient follow-up appointment to be made prior to discharge.  Utilization of ID recommended antibiotic therapy   Surgery Evaluation Cardiothoracic surgery consult if the following indications are present:  - Heart failure associated to valve dysfunction - Endocarditis complicated by heart block or aortic abscess - Left-sided endocarditis caused by S. aureus, fungal, or multi-drug resistant organisms - Left-sided endocarditis with severe valve dysfunction - Large vegetation (>1 cm) on aortic or mitral valve - Persistence of infection 5-7 days after initiation of appropriate antibiotic therapy - Recurrent emboli and persistent vegetations despite appropriate antibiotic therapy - Any history with left-sided embolization with persistent vegetations on TEE  Consult electrophysiologist if presence of implanted cardiac device (pacemaker, ICD)   Consult to cardiology if left ventricular ejection fraction is <40% Evaluation for appropriate heart failure medications upon discharge: - ACE/ARB - Diuretic - Beta blocker  Outpatient follow-up appointment to be made prior to discharge.   Based on the pharmacist's evaluation of this patient, the following are recommended to complete a thorough evaluation and care plan to reflect guideline-recommended therapy.  1. ID follow-up appointment to be made prior to discharge  Vertis Kelch, PharmD PGY1 Pharmacy Resident Phone [phone] 12/29/2018       9:10 AM

## 2018-12-29 NOTE — Progress Notes (Signed)
PROGRESS NOTE        PATIENT DETAILS Name: Rachel Holmes Age: 58 y.o. Sex: female Date of Birth: Jul 12, 1961 Admit Date: 12/24/2018 Admitting Physician Barb Merino, MD [facility]:[id number], Rubbie Battiest, MD  Brief Narrative: Patient is a 58 y.o. female-2, hypertension, peripheral neuropathy, Charcot joint involving left  foot presenting with 3-4-day history of fever, myalgias, left leg swelling-evaluated in the ED on 2/19, blood cultures drawn and subsequently sent home.  Brought back to the ED after blood cultures were positive for group G Streptococcus.  Further evaluation revealed aortic valve endocarditis.  Hospital course complicated by worsening respiratory failure post TEE on 2/25 requiring BiPAP support-felt to have a combination of decompensated diastolic heart failure and aspiration pneumonia.  See below for further details.  Subjective: Liberated off the BiPAP on 2/24-still requiring approximately 7 L of oxygen via high flow O2 to maintain O2 saturations above 90%.  Continues to cough-chest x-ray continues to show bibasilar infiltrates.  Assessment/Plan: Sepsis secondary to group G streptococcal bacteremia withaortic valve endocarditis and left leg soft tissue infection: Sepsis pathophysiology has improved, no further leukocytosis.  Afebrile overnight.  Left leg has markedly improved-very minimal swelling present (some swelling present at baseline per patient), erythema has resolved.  Lower extremity Doppler negative for DVT, MRI without any obvious abscess.  Both TTE and TEE confirmed aortic valve endocarditis.  Will consult IR for midline catheter (history of CKD)-ID recommending 6 weeks of cefazolin.  Will consult CT surgery as recommended by ID.  Acute hypoxic respiratory failure likely secondary to aspiration pneumonia and decompensated diastolic heart failure: Developed worsening shortness of breath post extubated after TEE on 2/24-has bibasilar  infiltrates-initially thought to have decompensated diastolic heart failure, and started on IV diuretics.  She is more than 3.8 L negative overnight-but continues to require significant amount of oxygen to maintain O2 saturation-suspect that respiratory failure is a combination of both decompensated heart failure that was probably provoked by aspiration pneumonia.  She is already on Ancef-we will add Flagyl.  We will ask nursing staff to slowly titrate FiO2 when able-repeat pediatric chest x-ray.  AKI on CKD stage IV: AKI hemodynamically mediated-creatinine slowly downtrending-but seems to have plateaued around the 3.0 range.  Due to hypervolemia-has been started on IV Lasix.  Continue to follow electrolytes closely.    Acute on chronic diastolic heart failure: Some improvement in volume status,-6 L so far.  Continues to have lower extremity edema and bibasilar rales-continue IV Lasix for another day-follow electrolytes, daily weights, intake/output.  Hypertension: Better controlled this morning-continue Lasix, amlodipine, clonidine and Imdur.    DM-2: CBGs stable-continue moderate scale SSI, Prandin and Trulicity.  Follow and optimize.    Left leg ulceration: Present prior to admission-continue wound care per wound care team.  History of Charcot arthropathy: Patient interested in establishing care in [place] used to previously followed at [facility] consulted Dr. Diana Eves will evaluate tomorrow.  DVT Prophylaxis: Prophylactic Heparin   Code Status: Full code   Family Communication: None at bedside-had discussed with spouse yesterday  Disposition Plan: Remain inpatient-require several days of hospitalization  Antimicrobial agents: Anti-infectives (From admission, onward)   Start     Dose/Rate Route Frequency Ordered Stop   12/29/18 1030  metroNIDAZOLE (FLAGYL) IVPB 500 mg     500 mg 100 mL/hr over 60 Minutes Intravenous Every 8 hours 12/29/18 1021  12/28/18 1300   ceFAZolin (ANCEF) IVPB 2g/100 mL premix     2 g 200 mL/hr over 30 Minutes Intravenous Every 12 hours 12/28/18 1225     12/25/18 1400  ceFEPIme (MAXIPIME) 1 g in sodium chloride 0.9 % 100 mL IVPB  Status:  Discontinued     1 g 200 mL/hr over 30 Minutes Intravenous Every 24 hours 12/24/18 1529 12/24/18 1551   12/24/18 1600  cefTRIAXone (ROCEPHIN) 2 g in sodium chloride 0.9 % 100 mL IVPB  Status:  Discontinued     2 g 200 mL/hr over 30 Minutes Intravenous Every 24 hours 12/24/18 1551 12/28/18 1225   12/24/18 1400  ceFEPIme (MAXIPIME) 1 g in sodium chloride 0.9 % 100 mL IVPB     1 g 200 mL/hr over 30 Minutes Intravenous  Once 12/24/18 1254 12/24/18 1613   12/24/18 1400  vancomycin (VANCOCIN) 2,500 mg in sodium chloride 0.9 % 500 mL IVPB     2,500 mg 250 mL/hr over 120 Minutes Intravenous  Once 12/24/18 1311 12/24/18 2007      Procedures: None  CONSULTS:  ID  Time spent: 35 minutes-Greater than 50% of this time was spent in counseling, explanation of diagnosis, planning of further management, and coordination of care.  MEDICATIONS: Scheduled Meds: . amLODipine  10 mg Oral Daily  . aspirin EC  325 mg Oral Daily  . cloNIDine  0.3 mg Oral BID  . docusate sodium  100 mg Oral BID  . Dulaglutide  1.5 mg Subcutaneous Q Sun  . DULoxetine  30 mg Oral Daily  . furosemide  60 mg Intravenous BID  . gabapentin  600 mg Oral QHS  . heparin  5,000 Units Subcutaneous Q8H  . insulin aspart  0-15 Units Subcutaneous TID WC  . insulin aspart  0-5 Units Subcutaneous QHS  . isosorbide mononitrate  120 mg Oral Daily  . pantoprazole  40 mg Oral Daily  . repaglinide  1 mg Oral TID AC  . sodium bicarbonate  650 mg Oral BID   Continuous Infusions: .  ceFAZolin (ANCEF) IV 2 g (12/29/18 0931)  . metronidazole     PRN Meds:.acetaminophen, albuterol, traMADol   PHYSICAL EXAM: Vital signs: Vitals:   12/29/18 0007 12/29/18 0503 12/29/18 0713 12/29/18 0759  BP:  (!) 168/81  (!) 137/96  Pulse: 92 91   92  Resp: 18 (!) 21  20  Temp:  99.6 F (37.6 C)  98.4 F (36.9 C)  TempSrc:  Oral  Oral  SpO2: 92% 93%  96%  Weight:   (!) 136.9 kg   Height:       Filed Weights   12/24/18 1500 12/27/18 1920 12/29/18 0713  Weight: (!) 138.8 kg (!) 143 kg (!) 136.9 kg   Body mass index is 48.71 kg/m.   General appearance:Awake, alert, not in any distress.  Eyes:no scleral icterus. HEENT: Atraumatic and Normocephalic Neck: supple, no JVD. Resp:Good air entry bilaterally, bibasilar rales CVS: S1 S2 regular, no murmurs.  GI: Bowel sounds present, Non tender and not distended with no gaurding, rigidity or rebound. Extremities: B/L Lower Ext shows + edema, both legs are warm to touch Neurology:  Non focal Psychiatric: Normal judgment and insight. Normal mood. Musculoskeletal:No digital cyanosis Skin:No Rash, warm and dry Wounds:N/A  I have personally reviewed following labs and imaging studies  LABORATORY DATA: CBC: Recent Labs  Lab 12/23/18 0943 12/24/18 1253 12/25/18 0342 12/26/18 0446 12/27/18 0512 12/29/18 0514  WBC 10.8* 18.0* 15.5* 11.0* 11.4* 10.1  NEUTROABS 9.7*  --   --   --   --   --   HGB 11.1* 9.3* 8.3* 8.8* 8.2* 7.8*  HCT 35.8* 30.9* 27.0* 28.3* 26.7* 24.7*  MCV 93.7 94.8 92.5 93.1 93.0 93.9  PLT 223 153 146* 149* 153 956    Basic Metabolic Panel: Recent Labs  Lab 12/25/18 0342 12/26/18 0446 12/27/18 0512 12/28/18 0512 12/29/18 0514  NA 132* 135 133* 132* 133*  K 4.5 4.6 4.6 4.9 4.4  CL 103 104 104 103 100  CO2 19* 20* 17* 18* 20*  GLUCOSE 146* 137* 213* 220* 186*  BUN 41* 37* 37* 36* 41*  CREATININE 3.28* 3.24* 3.03* 3.08* 3.06*  CALCIUM 7.5* 8.2* 8.1* 8.5* 8.5*    GFR: Estimated Creatinine Clearance: 28.9 mL/min (A) (by C-G formula based on SCr of 3.06 mg/dL (H)).  Liver Function Tests: Recent Labs  Lab 12/23/18 0943 12/24/18 1253 12/28/18 0512  AST 69* 145* 42*  ALT 39 133* 56*  ALKPHOS 116 99 176*  BILITOT 0.8 0.8 1.1  PROT 8.0 6.1* 6.7   ALBUMIN 3.9 2.6* 2.3*   No results for input(s): LIPASE, AMYLASE in the last 168 hours. No results for input(s): AMMONIA in the last 168 hours.  Coagulation Profile: No results for input(s): INR, PROTIME in the last 168 hours.  Cardiac Enzymes: No results for input(s): CKTOTAL, CKMB, CKMBINDEX, TROPONINI in the last 168 hours.  BNP (last 3 results) No results for input(s): PROBNP in the last 8760 hours.  HbA1C: No results for input(s): HGBA1C in the last 72 hours.  CBG: Recent Labs  Lab 12/28/18 0749 12/28/18 1221 12/28/18 1737 12/28/18 2223 12/29/18 0746  GLUCAP 189* 175* 239* 166* 185*    Lipid Profile: No results for input(s): CHOL, HDL, LDLCALC, TRIG, CHOLHDL, LDLDIRECT in the last 72 hours.  Thyroid Function Tests: No results for input(s): TSH, T4TOTAL, FREET4, T3FREE, THYROIDAB in the last 72 hours.  Anemia Panel: No results for input(s): VITAMINB12, FOLATE, FERRITIN, TIBC, IRON, RETICCTPCT in the last 72 hours.  Urine analysis:    Component Value Date/Time   COLORURINE YELLOW 12/23/2018 1145   APPEARANCEUR CLEAR 12/23/2018 1145   LABSPEC 1.012 12/23/2018 1145   PHURINE 6.0 12/23/2018 1145   GLUCOSEU NEGATIVE 12/23/2018 1145   HGBUR NEGATIVE 12/23/2018 1145   BILIRUBINUR NEGATIVE 12/23/2018 1145   KETONESUR NEGATIVE 12/23/2018 1145   PROTEINUR 100 (A) 12/23/2018 1145   NITRITE NEGATIVE 12/23/2018 1145   LEUKOCYTESUR TRACE (A) 12/23/2018 1145    Sepsis Labs: Lactic Acid, Venous    Component Value Date/Time   LATICACIDVEN 2.1 ([place]) 12/23/2018 0943    MICROBIOLOGY: Recent Results (from the past 240 hour(s))  Group A Strep by PCR     Status: None   Collection Time: 12/23/18  9:38 AM  Result Value Ref Range Status   Group A Strep by PCR NOT DETECTED NOT DETECTED Final    Comment: Performed at [facility], [place] [address] [place], [place] [address]  Culture, blood (single)     Status: Abnormal   Collection Time: 12/23/18  9:40 AM  Result  Value Ref Range Status   Specimen Description BLOOD LEFT HAND  Final   Special Requests   Final    BOTTLES DRAWN AEROBIC AND ANAEROBIC Blood Culture adequate volume   Culture  Setup Time   Final    AEROBIC BOTTLE ONLY GRAM POSITIVE COCCI Organism ID to follow CRITICAL RESULT CALLED TO, READ BACK BY AND VERIFIED WITH: K.NEIL,RN AT  0247 ON 12/24/18 BY G.MCADOO Performed at [facility], [place] [address], [place], [place] [address]    Culture STREPTOCOCCUS GROUP G (A)  Final   Report Status 12/27/2018 FINAL  Final   Organism ID, Bacteria STREPTOCOCCUS GROUP G  Final      Susceptibility   Streptococcus group g - MIC*    CLINDAMYCIN <=0.25 SENSITIVE Sensitive     AMPICILLIN <=0.25 SENSITIVE Sensitive     ERYTHROMYCIN <=0.12 SENSITIVE Sensitive     VANCOMYCIN 0.5 SENSITIVE Sensitive     CEFTRIAXONE <=0.12 SENSITIVE Sensitive     LEVOFLOXACIN 0.5 SENSITIVE Sensitive     * STREPTOCOCCUS GROUP G  Blood Culture ID Panel (Reflexed)     Status: Abnormal   Collection Time: 12/23/18  9:40 AM  Result Value Ref Range Status   Enterococcus species NOT DETECTED NOT DETECTED Final   Listeria monocytogenes NOT DETECTED NOT DETECTED Final   Staphylococcus species NOT DETECTED NOT DETECTED Final   Staphylococcus aureus (BCID) NOT DETECTED NOT DETECTED Final   Streptococcus species DETECTED (A) NOT DETECTED Final    Comment: Not Enterococcus species, Streptococcus agalactiae, Streptococcus pyogenes, or Streptococcus pneumoniae. CRITICAL RESULT CALLED TO, READ BACK BY AND VERIFIED WITH: K.NEIL,RN AT 0247 ON 12/24/18 BY G.MCADOO    Streptococcus agalactiae NOT DETECTED NOT DETECTED Final   Streptococcus pneumoniae NOT DETECTED NOT DETECTED Final   Streptococcus pyogenes NOT DETECTED NOT DETECTED Final   Acinetobacter baumannii NOT DETECTED NOT DETECTED Final   Enterobacteriaceae species NOT DETECTED NOT DETECTED Final   Enterobacter cloacae complex NOT DETECTED NOT DETECTED Final   Escherichia coli  NOT DETECTED NOT DETECTED Final   Klebsiella oxytoca NOT DETECTED NOT DETECTED Final   Klebsiella pneumoniae NOT DETECTED NOT DETECTED Final   Proteus species NOT DETECTED NOT DETECTED Final   Serratia marcescens NOT DETECTED NOT DETECTED Final   Haemophilus influenzae NOT DETECTED NOT DETECTED Final   Neisseria meningitidis NOT DETECTED NOT DETECTED Final   Pseudomonas aeruginosa NOT DETECTED NOT DETECTED Final   Candida albicans NOT DETECTED NOT DETECTED Final   Candida glabrata NOT DETECTED NOT DETECTED Final   Candida krusei NOT DETECTED NOT DETECTED Final   Candida parapsilosis NOT DETECTED NOT DETECTED Final   Candida tropicalis NOT DETECTED NOT DETECTED Final    Comment: Performed at [facility], [place]. [address] [place], [address] [address]  Blood culture (routine x 2)     Status: None (Preliminary result)   Collection Time: 12/24/18 12:53 PM  Result Value Ref Range Status   Specimen Description BLOOD RIGHT HAND  Final   Special Requests   Final    BOTTLES DRAWN AEROBIC AND ANAEROBIC Blood Culture results may not be optimal due to an inadequate volume of blood received in culture bottles   Culture   Final    NO GROWTH 4 DAYS Performed at [facility], [place] [address] [place], [place] [address]    Report Status PENDING  Incomplete  Blood culture (routine x 2)     Status: None (Preliminary result)   Collection Time: 12/24/18 12:58 PM  Result Value Ref Range Status   Specimen Description BLOOD LEFT HAND  Final   Special Requests   Final    BOTTLES DRAWN AEROBIC AND ANAEROBIC Blood Culture adequate volume   Culture   Final    NO GROWTH 4 DAYS Performed at [facility], [place] [address] [place], [place] [address]    Report Status PENDING  Incomplete  Urine Culture     Status: None   Collection Time: 12/25/18 11:57 AM  Result Value Ref Range Status   Specimen Description URINE, RANDOM  Final   Special Requests NONE  Final   Culture   Final    NO  GROWTH Performed at [facility], [address], [place], [place] [address]    Report Status 12/26/2018 FINAL  Final    RADIOLOGY STUDIES/RESULTS: Dg Chest 2 View  Result Date: 12/23/2018 CLINICAL DATA:  Fever and chills EXAM: CHEST - 2 VIEW COMPARISON:  February 03, 2017 FINDINGS: The lungs are clear. The heart size and pulmonary vascularity are normal. No adenopathy. There is degenerative change in the lower thoracic spine. IMPRESSION: No edema or consolidation. Electronically Signed   By: Lowella Grip III M.D.   On: 12/23/2018 10:14   Dg Knee 1-2 Views Right  Result Date: 12/02/2018 CLINICAL DATA:  RIGHT knee pain and swelling for 2 weeks post fall EXAM: RIGHT KNEE - 1-2 VIEW COMPARISON:  12/31/2017 FINDINGS: Osseous demineralization. Tricompartmental osteoarthritic changes with joint space narrowing and spur formation. Mild subchondral cyst formation at medial compartment, which demonstrates the greatest degree of narrowing. No acute fracture, dislocation, or bone destruction. No knee joint effusion. Scattered atherosclerotic calcifications. IMPRESSION: Tricompartmental osteoarthritic changes of the RIGHT knee greatest at medial compartment. Electronically Signed   By: Lavonia Dana M.D.   On: 12/02/2018 16:18   US Renal  Result Date: 12/26/2018 CLINICAL DATA:  Acute kidney injury. EXAM: RENAL / URINARY TRACT ULTRASOUND COMPLETE COMPARISON:  None. FINDINGS: Right Kidney: Renal measurements: 10.1 x 4.0 x 5.6 cm = volume: 120 mL. Increased echogenicity in the right kidney without hydronephrosis. There are at least 2 small hypoechoic cysts in the right kidney, largest measuring 1.4 cm. No suspicious renal lesion. Cortical thinning. Left Kidney: Renal measurements: 11.1 x 6.5 x 5.4 cm = volume: 203 mL. Normal echogenicity. Negative for hydronephrosis. Hypoechoic cyst in the lower pole measuring up to 2.2 cm. Cortical thinning. Bladder: Appears normal for degree of bladder distention.  IMPRESSION: 1. Negative for hydronephrosis. 2. Cortical thinning in both kidneys. Increased echogenicity in the right kidney. Findings may represent chronic medical renal disease. 3. Bilateral renal cysts. Electronically Signed   By: Markus Daft M.D.   On: 12/26/2018 14:36   Mr Tibia Fibula Left Wo Contrast  Result Date: 12/25/2018 CLINICAL DATA:  Diabetic patient with left lower leg pain and swelling. Chronic wound over the lateral malleolus of the left ankle. EXAM: MRI OF LOWER LEFT EXTREMITY WITHOUT CONTRAST TECHNIQUE: Multiplanar, multisequence MR imaging of the left lower leg was performed. No intravenous contrast was administered. COMPARISON:  None. FINDINGS: Bones/Joint/Cartilage Marrow edema in the distal 5 cm of the tibia and fibula is identified as seen on the patient's ankle MRI yesterday. Marrow signal is otherwise normal. Ligaments Negative. Muscles and Tendons There is mild fatty atrophy of intrinsic musculature of the lower leg. Intermediate increased T2 signal in all lower leg musculature is compatible with diabetic myopathy/denervation. A small amount of fluid is seen interposed between the soleus and medial gastrocnemius. No intramuscular fluid collection. No muscle tear or strain. Soft tissues Intense subcutaneous edema about the lower legs bilaterally appears worse on the left. IMPRESSION: Marrow edema in the distal 5 cm of the tibia and fibula as seen on MRI yesterday could be secondary to stress change or osteomyelitis. Marrow signal is otherwise normal. Intense subcutaneous edema in the lower legs bilaterally is worse on the left and  compatible with dependent change and/or cellulitis. Small volume of fluid interposed between the medial head of gastrocnemius and soleus could be due to dependent change or infection. Intermediate increased T2 signal in all lower leg musculature is most consistent with diabetic myopathy and denervation. No intramuscular fluid collection. Electronically Signed    By: Inge Rise M.D.   On: 12/25/2018 12:03   Mr Ankle Left Wo Contrast  Result Date: 12/25/2018 CLINICAL DATA:  Cellulitis with swelling bacteremia of the foot. EXAM: MRI OF THE LEFT ANKLE WITHOUT CONTRAST TECHNIQUE: Multiplanar, multisequence MR imaging of the ankle was performed. No intravenous contrast was administered. COMPARISON:  CT scan 09/16/2018 FINDINGS: TENDONS Peroneal: Thickened common peroneus tendon sheath posterior to the lateral malleolus. Moderate peroneus longus tendinopathy. Posteromedial: Prominent distal tibialis posterior tendinopathy. Ill definition the flexor digitorum longus in the vicinity of the knot of Henry suggesting partial tearing. Anterior: Tendinopathy of the tibialis anterior and extensor hallucis longus. Achilles: Fusiform thickening and accentuated signal in the distal Achilles tendon compatible with mild tendinopathy. Plantar Fascia: Abnormal thickening of the medial band of the plantar fascia with adjacent edema with plantar fasciitis. LIGAMENTS Lateral: The talus is dislocated, and thus the talofibular ligaments are torn. The inferior tibiofibular ligaments are indistinct. The calcaneofibular ligament is indistinct. There is some marrow edema in the calcaneus near the expected attachment site of the calcaneofibular ligament. Medial: The medial malleolus is thought to have fragmented. Amorphous soft tissue in the expected location of the tibiospring and tibionavicular portions of the deltoid ligament. Obviously the deep tibiotalar portion of the deltoid ligament is completely torn. CARTILAGE Ankle Joint and subtalar joint: Part of the talus, possibly constituting a portion of the talar dome, is dislocated about 4.5 cm anteriorly from its expected location, and demonstrates regions of low T2 signal possibly from osteonecrosis. Most of the talus S collapsed or necrosis, and in the expected location of the talus there is a 4.3 by 3.0 by 3.3 cm debris-filled cavity. The  tibia partially pseudo articulates with the calcaneus, and there is resorption of much of the upper calcaneus as well. Bones: Continuing the list of osseous abnormalities in this case, there is scalloping and thinning of the anterior calcaneus full long within eroded cuboid bone, as well as fragmentation and collapse of the middle and lateral cuneiform spur with severe irregularity, spurring, and erosions along the Lisfranc joint at the second through fifth digits. A bony fragment from the middle cuneiform may have fused in with the medial cuneiform. I favor that most of the findings seen in this case are neuropathic due to Charcot hindfoot and midfoot. If the patient has an occult draining sinus tract in the may be an indicator of a component of infection. Other: Extensive subcutaneous edema along the foot which may be reactive or due to cellulitis. IMPRESSION: 1. Severe Charcot arthropathy of the hindfoot and midfoot, with dislocation of the talus; destruction of much of the talus; resorption/destruction of the middle and lateral cuneiforms; extensive erosion of the anterior calcaneus and portions of the cuboid; extensive erosions along the bases of the metatarsals; interval fragmentation of the medial malleolus; and concomitant ligamentous disruptions. 2. Diffuse subcutaneous edema in the ankle may be reactive or due to cellulitis. 3. Flexor and extensor tendinopathy is noted above. 4. Achilles tendinopathy distally. 5. Plantar fasciitis. Electronically Signed   By: Van Clines M.D.   On: 12/25/2018 08:27   [name] Limited Joint Space Structures Low Right  Result Date: 12/18/2018 No images saved  Dg Chest Port 1 View  Result Date: 12/29/2018 CLINICAL DATA:  Shortness of breath. EXAM: PORTABLE CHEST 1 VIEW COMPARISON:  12/28/2018 and 12/23/2018 FINDINGS: Again noted are bilateral airspace densities. Most confluent airspace disease is located in the mid and lower right lung. Heart size appears to be  enlarged and stable. Negative for a pneumothorax. Mild blunting at the costophrenic angles and difficult to exclude small pleural effusions. IMPRESSION: Minimal change in the bilateral airspace disease, right side greater than left. Differential diagnosis includes pulmonary edema and/or pneumonia. Question tiny pleural effusions. Electronically Signed   By: Markus Daft M.D.   On: 12/29/2018 09:24   Dg Chest Port 1v Same Day  Result Date: 12/28/2018 CLINICAL DATA:  Shortness of breath after transesophageal echo EXAM: PORTABLE CHEST 1 VIEW COMPARISON:  December 23, 2018 FINDINGS: There is new infiltrate in the right base. There is mild new opacity in left perihilar region and left base. No pneumothorax. No change in the cardiomediastinal silhouette. No other acute abnormalities. IMPRESSION: Bilateral pulmonary infiltrates worrisome for aspiration or pneumonia. Recommend clinical correlation and follow-up to resolution. Electronically Signed   By: Dorise Bullion III M.D   On: 12/28/2018 11:57   Vas [name] Lower Extremity Venous (dvt)  Result Date: 12/25/2018  Lower Venous Study Indications: Swelling.  Limitations: Body habitus. Performing Technologist: Antonieta Pert RDMS, RVT  Examination Guidelines: A complete evaluation includes B-mode imaging, spectral Doppler, color Doppler, and power Doppler as needed of all accessible portions of each vessel. Bilateral testing is considered an integral part of a complete examination. Limited examinations for reoccurring indications may be performed as noted.  Right Venous Findings: +---+---------------+---------+-----------+----------+-------+    CompressibilityPhasicitySpontaneityPropertiesSummary +---+---------------+---------+-----------+----------+-------+ CFVFull           Yes      Yes                          +---+---------------+---------+-----------+----------+-------+  Left Venous Findings:  +---------+---------------+---------+-----------+----------+--------------+          CompressibilityPhasicitySpontaneityPropertiesSummary        +---------+---------------+---------+-----------+----------+--------------+ CFV      Full           Yes      Yes                                 +---------+---------------+---------+-----------+----------+--------------+ SFJ      Full                                                        +---------+---------------+---------+-----------+----------+--------------+ FV Prox  Full                                                        +---------+---------------+---------+-----------+----------+--------------+ FV Mid   Full                                                        +---------+---------------+---------+-----------+----------+--------------+ FV DistalFull                                                        +---------+---------------+---------+-----------+----------+--------------+  PFV      Full                                                        +---------+---------------+---------+-----------+----------+--------------+ POP      Full           Yes      Yes                                 +---------+---------------+---------+-----------+----------+--------------+ PTV                                                   Not visualized +---------+---------------+---------+-----------+----------+--------------+ PERO                                                  Not visualized +---------+---------------+---------+-----------+----------+--------------+ GSV      Full                                                        +---------+---------------+---------+-----------+----------+--------------+    Summary: Right: No evidence of common femoral vein obstruction. Left: There is no evidence of deep vein thrombosis in the lower extremity. However, portions of this examination were limited- see  technologist comments above. No cystic structure found in the popliteal fossa. Prominent lymph nodes noted, largest measuring 3.2cm  *See table(s) above for measurements and observations. Electronically signed by Deitra Mayo MD on 12/25/2018 at 2:35:28 PM.    Final      LOS: 5 days   Oren Binet, MD  [facility]  If 7PM-7AM, please contact night-coverage  Please page via [email or web address]  Go to [email or web address] and use [place]'s universal password to access. If you do not have the password, please contact the hospital operator.  Locate the [facility] provider you are looking for under [facility] and page to a number that you can be directly reached. If you still have difficulty reaching the provider, please page the [facility] (Director on Call) for the Hospitalists listed on amion for assistance.  12/29/2018, 10:27 AM

## 2018-12-29 NOTE — Progress Notes (Signed)
Patient does not wish to wear NIV at this time, states she would like to stay with cannula. No distress noted.

## 2018-12-29 NOTE — Progress Notes (Signed)
Physical Therapy Treatment Patient Details Name: Rachel Holmes MRN: [id number] DOB: 08-07-1961 Today's Date: 12/29/2018    History of Present Illness Patient is a 58 y.o. female-2, hypertension, peripheral neuropathy, Charcot joint involving left  foot presenting with 3-4-day history of fever, myalgias, left leg swelling-evaluated in the ED on 2/19, blood cultures drawn and positive for strep. Pt underwent TEE on 2/24 and went into respiratory failure when extubated, placed on Bipap. Pt with possible aspiration PNA.     PT Comments    Pt worked on sitting balance EOB and then transferred bed to chair with RW and min A keeping wt off LLE. Pt reports that she will need to be able to put some wt on LLE in a safe way to d/c home and be independent. Dr Sharol Given to see pt tomorrow and will hopefully be able to recommend what type of support will be best for LLE. Pt unable to ambulate and maintain LLE NWB due to inability to hop. PT will continue to follow.    Follow Up Recommendations  Home health PT     Equipment Recommendations  None recommended by PT    Recommendations for Other Services       Precautions / Restrictions Precautions Precautions: Fall Precaution Comments: up with assist, use RW for transfers Required Braces or Orthoses: (TBD after medical c/s completed) Restrictions Weight Bearing Restrictions: Yes LLE Weight Bearing: Non weight bearing Other Position/Activity Restrictions: per Dr Sloan Leiter, pt will be able to bear wt in CAM boot with wound dressed but Sharol Given to see 2/26 so can get clarification then as well    Mobility  Bed Mobility Overal bed mobility: Modified Independent             General bed mobility comments: able to transition to EOB unassisted  Transfers Overall transfer level: Needs assistance Equipment used: Rolling walker (2 wheeled) Transfers: Sit to/from [facility] Sit to Stand: Min assist Stand pivot transfers: Min assist        General transfer comment: min A for power up to RW, min A to steady with pivot to chair. Pt unable to hop but could lift heel and slide R foot to recliner. L foot sitting on floor but no significant wt.   Ambulation/Gait             General Gait Details: unable   Stairs             Wheelchair Mobility    Modified Rankin (Stroke Patients Only)       Balance Overall balance assessment: Needs assistance Sitting-balance support: No upper extremity supported Sitting balance-Leahy Scale: Normal Sitting balance - Comments: able to sit and bathe self EOB and lean to elbow right and left with no LOB   Standing balance support: Bilateral upper extremity supported Standing balance-Leahy Scale: Poor Standing balance comment: needs B UE support to maintain standing                            Cognition Arousal/Alertness: Awake/alert Behavior During Therapy: WFL for tasks assessed/performed Overall Cognitive Status: Within Functional Limits for tasks assessed                                        Exercises      General Comments General comments (skin integrity, edema, etc.): Pt bathed with O2 off  for a few minutes and felt sleepy, when O2 checked sats were 67%. 7L O2 replaced and sats returned to 90's with pt also feeling better      Pertinent Vitals/Pain Pain Assessment: No/denies pain    Home Living                      Prior Function            PT Goals (current goals can now be found in the care plan section) Acute Rehab PT Goals Patient Stated Goal: eventually to replace right knee due to OA PT Goal Formulation: With patient Time For Goal Achievement: 01/10/19 Potential to Achieve Goals: Good Progress towards PT goals: Progressing toward goals    Frequency    Min 3X/week      PT Plan Current plan remains appropriate    Co-evaluation              AM-PAC PT "6 Clicks" Mobility   Outcome Measure   Help needed turning from your back to your side while in a flat bed without using bedrails?: None Help needed moving from lying on your back to sitting on the side of a flat bed without using bedrails?: None Help needed moving to and from a bed to a chair (including a wheelchair)?: A Little Help needed standing up from a chair using your arms (e.g., wheelchair or bedside chair)?: A Little Help needed to walk in hospital room?: Total Help needed climbing 3-5 steps with a railing? : Total 6 Click Score: 16    End of Session Equipment Utilized During Treatment: Gait belt Activity Tolerance: Patient tolerated treatment well Patient left: with call bell/phone within reach;in chair Nurse Communication: Mobility status(SpO2 on RA) PT Visit Diagnosis: Difficulty in walking, not elsewhere classified (R26.2)     Time: 5035-4656 PT Time Calculation (min) (ACUTE ONLY): 33 min  Charges:  $Therapeutic Activity: 8-22 mins $Neuromuscular Re-education: 8-22 mins                     Rachel Holmes, [place]  Pager [phone] Office [place] 12/29/2018, 11:53 AM

## 2018-12-29 NOTE — Progress Notes (Deleted)
[name] [place], [place] [address] Phone: [phone] Subjective:    I'm seeing this patient by the request  of:    CC: Right knee pain follow-up  [id number]  Rachel Holmes is a 58 y.o. female coming in with complaint of ***  Onset-  Location Duration-  Character- Aggravating factors- Reliving factors-  Therapies tried-  Severity-   Since we have seen patient patient did have the flu and then bacteremia.  Past Medical History:  Diagnosis Date  . Allergic rhinitis   . Anemia   . Anxiety   . Back pain   . Bradycardia   . Breast mass    Patient can no longer palpate specific masses but showed tech general area of concern  . CHF (congestive heart failure) ([place])   . CKD (chronic kidney disease)    STAGE 3  . Constipation   . Diabetes mellitus without complication ([place])   . Dyspnea   . GERD (gastroesophageal reflux disease)   . HLD (hyperlipidemia)   . Hyperparathyroidism ([place])   . Hypertension   . Joint pain   . Leg edema   . Legally blind in left eye, as defined in [place]   . Lymphedema   . Onychomycosis    Past Surgical History:  Procedure Laterality Date  . ABDOMINAL HYSTERECTOMY    . BREAST BIOPSY Left 2014   FNA 12:00 position - Negative  . EYE SURGERY    . IR FLUORO GUIDE CV LINE RIGHT  12/29/2018  . IR US GUIDE VASC ACCESS RIGHT  12/29/2018  . TEE WITHOUT CARDIOVERSION N/A 12/28/2018   Procedure: TRANSESOPHAGEAL ECHOCARDIOGRAM (TEE);  Surgeon: Sueanne Margarita, MD;  Location[facility] ENDOSCOPY;  Service: Cardiovascular;  Laterality: N/A;   Social History   Socioeconomic History  . Marital status: Divorced    Spouse name: Not on file  . Number of children: Not on file  . Years of education: Not on file  . Highest education level: Not on file  Occupational History  . Occupation: Glass blower/designer  Social Needs  . Financial resource strain: Not on file  . Food insecurity:    Worry: Not on file   Inability: Not on file  . Transportation needs:    Medical: Not on file    Non-medical: Not on file  Tobacco Use  . Smoking status: Never Smoker  . Smokeless tobacco: Never Used  Substance and Sexual Activity  . Alcohol use: No  . Drug use: Never  . Sexual activity: Not on file  Lifestyle  . Physical activity:    Days per week: Not on file    Minutes per session: Not on file  . Stress: Not on file  Relationships  . Social connections:    Talks on phone: Not on file    Gets together: Not on file    Attends religious service: Not on file    Active member of club or organization: Not on file    Attends meetings of clubs or organizations: Not on file    Relationship status: Not on file  Other Topics Concern  . Not on file  Social History Narrative   ** Merged History Encounter **       Allergies  Allergen Reactions  . Statins Shortness Of Breath    Wheezing, short of breath  . Codeine Nausea Only  . Ibuprofen Other (See Comments)    Reaction:  Raises pts BP  . Penicillins Hives  and Other (See Comments)    Has patient had a PCN reaction causing immediate rash, facial/tongue/throat swelling, SOB or lightheadedness with hypotension: No Has patient had a PCN reaction causing severe rash involving mucus membranes or skin necrosis: No Has patient had a PCN reaction that required hospitalization No Has patient had a PCN reaction occurring within the last 10 years: No If all of the above answers are "NO", then may proceed with Cephalosporin use.  [name] Percocet [Oxycodone-Acetaminophen] Nausea Only  . Tramadol Nausea Only    Can take if she has eaten  . Vicodin [Hydrocodone-Acetaminophen] Nausea Only   Family History  Problem Relation Age of Onset  . Breast cancer Sister 50  . Diabetes Sister   . Diabetes Mother   . Hypertension Mother   . Hyperlipidemia Mother   . Eating disorder Mother   . Obesity Mother       Facility-Administered Medications Ordered in Other Visits  (Endocrine & Metabolic):  [name]  Dulaglutide SOPN 1.5 mg .  insulin aspart (novoLOG) injection 0-15 Units .  insulin aspart (novoLOG) injection 0-5 Units .  repaglinide (PRANDIN) tablet 1 mg    Facility-Administered Medications Ordered in Other Visits (Cardiovascular):  .  amLODipine (NORVASC) tablet 10 mg .  cloNIDine (CATAPRES) tablet 0.3 mg .  furosemide (LASIX) injection 60 mg .  isosorbide mononitrate (IMDUR) 24 hr tablet 120 mg    Facility-Administered Medications Ordered in Other Visits (Respiratory):  .  albuterol (PROVENTIL) (2.5 MG/3ML) 0.083% nebulizer solution 2.5 mg    Facility-Administered Medications Ordered in Other Visits (Analgesics):  .  acetaminophen (TYLENOL) tablet 650 mg .  aspirin EC tablet 325 mg .  lidocaine (XYLOCAINE) 1 % (with pres) injection .  lidocaine (XYLOCAINE) 1 % (with pres) injection .  lidocaine (XYLOCAINE) 1 % (with pres) injection .  traMADol (ULTRAM) tablet 50 mg    Facility-Administered Medications Ordered in Other Visits (Hematological):  [name]  [START ON 12/30/2018] heparin injection 5,000 Units    Facility-Administered Medications Ordered in Other Visits (Other):  [name]  ceFAZolin (ANCEF) IVPB 2g/100 mL premix .  docusate sodium (COLACE) capsule 100 mg .  DULoxetine (CYMBALTA) DR capsule 30 mg .  gabapentin (NEURONTIN) tablet 600 mg .  metroNIDAZOLE (FLAGYL) tablet 500 mg .  pantoprazole (PROTONIX) EC tablet 40 mg .  sodium bicarbonate tablet 650 mg No current facility-administered medications for this visit.  No current outpatient medications on file.    Past medical history, social, surgical and family history all reviewed in electronic medical record.  No pertanent information unless stated regarding to the chief complaint.   Review of Systems:  No headache, visual changes, nausea, vomiting, diarrhea, constipation, dizziness, abdominal pain, skin rash, fevers, chills, night sweats, weight loss, swollen lymph nodes, body aches,  joint swelling, muscle aches, chest pain, shortness of breath, mood changes.   Objective  There were no vitals taken for this visit. Systems examined below as of    General: No apparent distress alert and oriented x3 mood and affect normal, dressed appropriately.  HEENT: Pupils equal, extraocular movements intact  Respiratory: Patient's speak in full sentences and does not appear short of breath  Cardiovascular: No lower extremity edema, non tender, no erythema  Skin: Warm dry intact with no signs of infection or rash on extremities or on axial skeleton.  Abdomen: Soft nontender  Neuro: Cranial nerves II through XII are intact, neurovascularly intact in all extremities with 2+ DTRs and 2+ pulses.  Lymph: No lymphadenopathy of posterior or  anterior cervical chain or axillae bilaterally.  Gait normal with good balance and coordination.  MSK:  Non tender with full range of motion and good stability and symmetric strength and tone of shoulders, elbows, wrist, hip, knee and ankles bilaterally.     Impression and Recommendations:     This case required medical decision making of moderate complexity. The above documentation has been reviewed and is accurate and complete Lyndal Pulley, DO       Note: This dictation was prepared with Dragon dictation along with smaller phrase technology. Any transcriptional errors that result from this process are unintentional.

## 2018-12-30 ENCOUNTER — Inpatient Hospital Stay (HOSPITAL_COMMUNITY): Payer: Medicare HMO

## 2018-12-30 ENCOUNTER — Ambulatory Visit: Payer: Medicare HMO | Admitting: Family Medicine

## 2018-12-30 DIAGNOSIS — E1165 Type 2 diabetes mellitus with hyperglycemia: Secondary | ICD-10-CM

## 2018-12-30 DIAGNOSIS — E1142 Type 2 diabetes mellitus with diabetic polyneuropathy: Secondary | ICD-10-CM

## 2018-12-30 LAB — CBC
HCT: 25.8 % — ABNORMAL LOW (ref 36.0–46.0)
Hemoglobin: 7.8 g/dL — ABNORMAL LOW (ref 12.0–15.0)
MCH: 28.1 pg (ref 26.0–34.0)
MCHC: 30.2 g/dL (ref 30.0–36.0)
MCV: 92.8 fL (ref 80.0–100.0)
Platelets: 248 10*3/uL (ref 150–400)
RBC: 2.78 MIL/uL — ABNORMAL LOW (ref 3.87–5.11)
RDW: 13.5 % (ref 11.5–15.5)
WBC: 9.9 10*3/uL (ref 4.0–10.5)
nRBC: 0 % (ref 0.0–0.2)

## 2018-12-30 LAB — GLUCOSE, CAPILLARY
Glucose-Capillary: 146 mg/dL — ABNORMAL HIGH (ref 70–99)
Glucose-Capillary: 220 mg/dL — ABNORMAL HIGH (ref 70–99)
Glucose-Capillary: 121 mg/dL — ABNORMAL HIGH (ref 70–99)
Glucose-Capillary: 177 mg/dL — ABNORMAL HIGH (ref 70–99)

## 2018-12-30 LAB — BASIC METABOLIC PANEL
Anion gap: 12 (ref 5–15)
BUN: 42 mg/dL — ABNORMAL HIGH (ref 6–20)
CO2: 23 mmol/L (ref 22–32)
Calcium: 8.5 mg/dL — ABNORMAL LOW (ref 8.9–10.3)
Chloride: 99 mmol/L (ref 98–111)
Creatinine, Ser: 2.9 mg/dL — ABNORMAL HIGH (ref 0.44–1.00)
GFR calc Af Amer: 20 mL/min — ABNORMAL LOW (ref >60)
GFR calc non Af Amer: 17 mL/min — ABNORMAL LOW (ref >60)
Glucose, Bld: 173 mg/dL — ABNORMAL HIGH (ref 70–99)
Potassium: 3.8 mmol/L (ref 3.5–5.1)
Sodium: 134 mmol/L — ABNORMAL LOW (ref 135–145)

## 2018-12-30 NOTE — Progress Notes (Signed)
PHARMACY CONSULT NOTE FOR:  OUTPATIENT  PARENTERAL ANTIBIOTIC THERAPY (OPAT)  Indication: Endocarditis Regimen: Cefazolin 2g IV q12 x 6wks End date: 02/11/19  IV antibiotic discharge orders are pended. To discharging provider:  please sign these orders via discharge navigator,  Select New Orders & click on the button choice - Manage This Unsigned Work.    Onnie Boer, PharmD, BCIDP, AAHIVP, CPP Infectious Disease Pharmacist 12/30/2018 10:19 AM

## 2018-12-30 NOTE — Progress Notes (Signed)
Pt not wearing NIV wanting to remain on nasal cannula

## 2018-12-30 NOTE — Progress Notes (Signed)
PROGRESS NOTE        PATIENT DETAILS Name: Rachel Holmes Age: 58 y.o. Sex: female Date of Birth: 1960-12-02 Admit Date: 12/24/2018 Admitting Physician Barb Merino, MD JSH:[id number], Rubbie Battiest, MD  Brief Narrative: Patient is a 58 y.o. female-2, hypertension, peripheral neuropathy, Charcot joint involving left  foot presenting with 3-4-day history of fever, myalgias, left leg swelling-evaluated in the ED on 2/19, blood cultures drawn and subsequently sent home.  Brought back to the ED after blood cultures were positive for group G Streptococcus.  Further evaluation revealed aortic valve endocarditis.  Hospital course complicated by worsening respiratory failure post TEE on 2/25 requiring BiPAP support-felt to have a combination of decompensated diastolic heart failure and aspiration pneumonia.  See below for further details.  Subjective: Awake alert feels much better.  FiO2 decreased to 2 L via nasal cannula this morning by me-O2 saturation stable.  Assessment/Plan: Sepsis secondary to group G streptococcal bacteremia withaortic valve endocarditis and left leg soft tissue infection: Sepsis pathophysiology has resolved, left leg has markedly improved-very minimal swelling (present at baseline).  She is now afebrile.  Lower extremity Dopplers negative for DVT, MRI without any obvious abscess.    Both TTE and TEE confirmed aortic valve endocarditis.  Will consult IR for midline catheter (history of CKD)-ID recommending 6 weeks of cefazolin and stop date of 02/11/2019.  CT surgery consulted-no indication for surgery.  Acute hypoxic respiratory failure likely secondary to aspiration pneumonia and decompensated diastolic heart failure: Improved with IV Lasix and Ancef/Flagyl.  Was on 5 L of FiO2 this morning-has been decreased to 2 L.  Have asked nursing staff to see if patient can be titrated to room air.  Volume status is markedly improved-renal function also improving, -8.7 L  so far.  Plans are to continue IV Lasix, IV Flagyl/Ancef-and reassess tomorrow.  Follow weights, intake/output and electrolytes.  Hypertension: Treating-remains on the higher side of things-continue Lasix, amlodipine, clonidine and Imdur.  Follow and optimize  DM-2: CBGs stable-continue moderate scale SSI, Prandin and Trulicity.  Follow and optimize   Left leg ulceration: Present prior to admission-continue wound care per wound care team.  History of Charcot arthropathy: Patient interested in establishing care in [place] used to previously followed at [facility] consulted Dr. Eber Hong formal evaluation.  DVT Prophylaxis: Prophylactic Heparin   Code Status: Full code   Family Communication: None at bedside  Disposition Plan: Remain inpatient  Antimicrobial agents: Anti-infectives (From admission, onward)   Start     Dose/Rate Route Frequency Ordered Stop   12/29/18 1400  metroNIDAZOLE (FLAGYL) tablet 500 mg     500 mg Oral Every 8 hours 12/29/18 1141     12/29/18 1100  metroNIDAZOLE (FLAGYL) IVPB 500 mg  Status:  Discontinued     500 mg 100 mL/hr over 60 Minutes Intravenous Every 8 hours 12/29/18 1021 12/29/18 1141   12/28/18 1300  ceFAZolin (ANCEF) IVPB 2g/100 mL premix     2 g 200 mL/hr over 30 Minutes Intravenous Every 12 hours 12/28/18 1225     12/25/18 1400  ceFEPIme (MAXIPIME) 1 g in sodium chloride 0.9 % 100 mL IVPB  Status:  Discontinued     1 g 200 mL/hr over 30 Minutes Intravenous Every 24 hours 12/24/18 1529 12/24/18 1551   12/24/18 1600  cefTRIAXone (ROCEPHIN) 2 g in sodium chloride 0.9 % 100 mL IVPB  Status:  Discontinued     2 g 200 mL/hr over 30 Minutes Intravenous Every 24 hours 12/24/18 1551 12/28/18 1225   12/24/18 1400  ceFEPIme (MAXIPIME) 1 g in sodium chloride 0.9 % 100 mL IVPB     1 g 200 mL/hr over 30 Minutes Intravenous  Once 12/24/18 1254 12/24/18 1613   12/24/18 1400  vancomycin (VANCOCIN) 2,500 mg in sodium chloride 0.9 % 500 mL  IVPB     2,500 mg 250 mL/hr over 120 Minutes Intravenous  Once 12/24/18 1311 12/24/18 2007      Procedures: None  CONSULTS:  ID  Time spent: 25 minutes-Greater than 50% of this time was spent in counseling, explanation of diagnosis, planning of further management, and coordination of care.  MEDICATIONS: Scheduled Meds: . amLODipine  10 mg Oral Daily  . aspirin EC  325 mg Oral Daily  . cloNIDine  0.3 mg Oral BID  . docusate sodium  100 mg Oral BID  . Dulaglutide  1.5 mg Subcutaneous Q Sun  . DULoxetine  30 mg Oral Daily  . furosemide  60 mg Intravenous BID  . gabapentin  600 mg Oral QHS  . heparin  5,000 Units Subcutaneous Q8H  . insulin aspart  0-15 Units Subcutaneous TID WC  . insulin aspart  0-5 Units Subcutaneous QHS  . isosorbide mononitrate  120 mg Oral Daily  . metroNIDAZOLE  500 mg Oral Q8H  . pantoprazole  40 mg Oral Daily  . repaglinide  1 mg Oral TID AC  . sodium bicarbonate  650 mg Oral BID   Continuous Infusions: .  ceFAZolin (ANCEF) IV 2 g (12/29/18 2226)   PRN Meds:.acetaminophen, albuterol, lidocaine, lidocaine, traMADol   PHYSICAL EXAM: Vital signs: Vitals:   12/29/18 1505 12/29/18 2133 12/30/18 0500 12/30/18 0557  BP: (!) 152/70 (!) 166/69  (!) 179/73  Pulse: 76 86  75  Resp: 19 18  18   Temp: 97.9 F (36.6 C) 98.1 F (36.7 C)  (!) 97.5 F (36.4 C)  TempSrc:  Oral    SpO2: 94% 95%  100%  Weight:   (!) 137.2 kg   Height:       Filed Weights   12/27/18 1920 12/29/18 0713 12/30/18 0500  Weight: (!) 143 kg (!) 136.9 kg (!) 137.2 kg   Body mass index is 48.82 kg/m.   General appearance:Awake, alert, not in any distress.  Eyes:no scleral icterus. HEENT: Atraumatic and Normocephalic Neck: supple, no JVD. Resp:Good air entry bilaterally, few rales CVS: S1 S2 regular GI: Bowel sounds present, Non tender and not distended with no gaurding, rigidity or rebound. Extremities: B/L Lower Ext shows no edema, both legs are warm to touch Neurology:   Non focal Musculoskeletal:No digital cyanosis Skin:No Rash, warm and dry Wounds:N/A  I have personally reviewed following labs and imaging studies  LABORATORY DATA: CBC: Recent Labs  Lab 12/23/18 0943  12/25/18 0342 12/26/18 0446 12/27/18 0512 12/29/18 0514 12/30/18 0351  WBC 10.8*   < > 15.5* 11.0* 11.4* 10.1 9.9  NEUTROABS 9.7*  --   --   --   --   --   --   HGB 11.1*   < > 8.3* 8.8* 8.2* 7.8* 7.8*  HCT 35.8*   < > 27.0* 28.3* 26.7* 24.7* 25.8*  MCV 93.7   < > 92.5 93.1 93.0 93.9 92.8  PLT 223   < > 146* 149* 153 190 248   < > = values in this interval not displayed.  Basic Metabolic Panel: Recent Labs  Lab 12/26/18 0446 12/27/18 0512 12/28/18 0512 12/29/18 0514 12/30/18 0351  NA 135 133* 132* 133* 134*  K 4.6 4.6 4.9 4.4 3.8  CL 104 104 103 100 99  CO2 20* 17* 18* 20* 23  GLUCOSE 137* 213* 220* 186* 173*  BUN 37* 37* 36* 41* 42*  CREATININE 3.24* 3.03* 3.08* 3.06* 2.90*  CALCIUM 8.2* 8.1* 8.5* 8.5* 8.5*    GFR: Estimated Creatinine Clearance: 30.6 mL/min (A) (by C-G formula based on SCr of 2.9 mg/dL (H)).  Liver Function Tests: Recent Labs  Lab 12/23/18 0943 12/24/18 1253 12/28/18 0512  AST 69* 145* 42*  ALT 39 133* 56*  ALKPHOS 116 99 176*  BILITOT 0.8 0.8 1.1  PROT 8.0 6.1* 6.7  ALBUMIN 3.9 2.6* 2.3*   No results for input(s): LIPASE, AMYLASE in the last 168 hours. No results for input(s): AMMONIA in the last 168 hours.  Coagulation Profile: No results for input(s): INR, PROTIME in the last 168 hours.  Cardiac Enzymes: No results for input(s): CKTOTAL, CKMB, CKMBINDEX, TROPONINI in the last 168 hours.  BNP (last 3 results) No results for input(s): PROBNP in the last 8760 hours.  HbA1C: No results for input(s): HGBA1C in the last 72 hours.  CBG: Recent Labs  Lab 12/29/18 0746 12/29/18 1201 12/29/18 1557 12/29/18 2154 12/30/18 0757  GLUCAP 185* 202* 115* 197* 146*    Lipid Profile: No results for input(s): CHOL, HDL,  LDLCALC, TRIG, CHOLHDL, LDLDIRECT in the last 72 hours.  Thyroid Function Tests: No results for input(s): TSH, T4TOTAL, FREET4, T3FREE, THYROIDAB in the last 72 hours.  Anemia Panel: No results for input(s): VITAMINB12, FOLATE, FERRITIN, TIBC, IRON, RETICCTPCT in the last 72 hours.  Urine analysis:    Component Value Date/Time   COLORURINE YELLOW 12/23/2018 1145   APPEARANCEUR CLEAR 12/23/2018 1145   LABSPEC 1.012 12/23/2018 1145   PHURINE 6.0 12/23/2018 1145   GLUCOSEU NEGATIVE 12/23/2018 1145   HGBUR NEGATIVE 12/23/2018 1145   BILIRUBINUR NEGATIVE 12/23/2018 1145   KETONESUR NEGATIVE 12/23/2018 1145   PROTEINUR 100 (A) 12/23/2018 1145   NITRITE NEGATIVE 12/23/2018 1145   LEUKOCYTESUR TRACE (A) 12/23/2018 1145    Sepsis Labs: Lactic Acid, Venous    Component Value Date/Time   LATICACIDVEN 2.1 ([place]) 12/23/2018 0943    MICROBIOLOGY: Recent Results (from the past 240 hour(s))  Group A Strep by PCR     Status: None   Collection Time: 12/23/18  9:38 AM  Result Value Ref Range Status   Group A Strep by PCR NOT DETECTED NOT DETECTED Final    Comment: Performed at [facility], [place] [address], [place], [place] [address]  Culture, blood (single)     Status: Abnormal   Collection Time: 12/23/18  9:40 AM  Result Value Ref Range Status   Specimen Description BLOOD LEFT HAND  Final   Special Requests   Final    BOTTLES DRAWN AEROBIC AND ANAEROBIC Blood Culture adequate volume   Culture  Setup Time   Final    AEROBIC BOTTLE ONLY GRAM POSITIVE COCCI Organism ID to follow CRITICAL RESULT CALLED TO, READ BACK BY AND VERIFIED WITH: K.NEIL,RN AT 2774 ON 12/24/18 BY G.[email or web address] Performed at [facility], [place] [address], [place], [place] [address]    Culture STREPTOCOCCUS GROUP G (A)  Final   Report Status 12/27/2018 FINAL  Final   Organism ID, Bacteria STREPTOCOCCUS GROUP G  Final      Susceptibility  Streptococcus group g - MIC*    CLINDAMYCIN <=0.25 SENSITIVE Sensitive      AMPICILLIN <=0.25 SENSITIVE Sensitive     ERYTHROMYCIN <=0.12 SENSITIVE Sensitive     VANCOMYCIN 0.5 SENSITIVE Sensitive     CEFTRIAXONE <=0.12 SENSITIVE Sensitive     LEVOFLOXACIN 0.5 SENSITIVE Sensitive     * STREPTOCOCCUS GROUP G  Blood Culture ID Panel (Reflexed)     Status: Abnormal   Collection Time: 12/23/18  9:40 AM  Result Value Ref Range Status   Enterococcus species NOT DETECTED NOT DETECTED Final   Listeria monocytogenes NOT DETECTED NOT DETECTED Final   Staphylococcus species NOT DETECTED NOT DETECTED Final   Staphylococcus aureus (BCID) NOT DETECTED NOT DETECTED Final   Streptococcus species DETECTED (A) NOT DETECTED Final    Comment: Not Enterococcus species, Streptococcus agalactiae, Streptococcus pyogenes, or Streptococcus pneumoniae. CRITICAL RESULT CALLED TO, READ BACK BY AND VERIFIED WITH: K.NEIL,RN AT 0247 ON 12/24/18 BY G.MCADOO    Streptococcus agalactiae NOT DETECTED NOT DETECTED Final   Streptococcus pneumoniae NOT DETECTED NOT DETECTED Final   Streptococcus pyogenes NOT DETECTED NOT DETECTED Final   Acinetobacter baumannii NOT DETECTED NOT DETECTED Final   Enterobacteriaceae species NOT DETECTED NOT DETECTED Final   Enterobacter cloacae complex NOT DETECTED NOT DETECTED Final   Escherichia coli NOT DETECTED NOT DETECTED Final   Klebsiella oxytoca NOT DETECTED NOT DETECTED Final   Klebsiella pneumoniae NOT DETECTED NOT DETECTED Final   Proteus species NOT DETECTED NOT DETECTED Final   Serratia marcescens NOT DETECTED NOT DETECTED Final   Haemophilus influenzae NOT DETECTED NOT DETECTED Final   Neisseria meningitidis NOT DETECTED NOT DETECTED Final   Pseudomonas aeruginosa NOT DETECTED NOT DETECTED Final   Candida albicans NOT DETECTED NOT DETECTED Final   Candida glabrata NOT DETECTED NOT DETECTED Final   Candida krusei NOT DETECTED NOT DETECTED Final   Candida parapsilosis NOT DETECTED NOT DETECTED Final   Candida tropicalis NOT DETECTED NOT DETECTED  Final    Comment: Performed at [facility], [place]. [address] [place], [place] [address]  Blood culture (routine x 2)     Status: None   Collection Time: 12/24/18 12:53 PM  Result Value Ref Range Status   Specimen Description BLOOD RIGHT HAND  Final   Special Requests   Final    BOTTLES DRAWN AEROBIC AND ANAEROBIC Blood Culture results may not be optimal due to an inadequate volume of blood received in culture bottles   Culture   Final    NO GROWTH 5 DAYS Performed at [facility], [place] [address] [place], [place] [address]    Report Status 12/29/2018 FINAL  Final  Blood culture (routine x 2)     Status: None   Collection Time: 12/24/18 12:58 PM  Result Value Ref Range Status   Specimen Description BLOOD LEFT HAND  Final   Special Requests   Final    BOTTLES DRAWN AEROBIC AND ANAEROBIC Blood Culture adequate volume   Culture   Final    NO GROWTH 5 DAYS Performed at [facility], [place] [address], [place], [place] [address]    Report Status 12/29/2018 FINAL  Final  Urine Culture     Status: None   Collection Time: 12/25/18 11:57 AM  Result Value Ref Range Status   Specimen Description URINE, RANDOM  Final   Special Requests NONE  Final   Culture   Final    NO GROWTH Performed at [facility], [address] [place], [place] [address]    Report Status 12/26/2018 FINAL  Final    RADIOLOGY STUDIES/RESULTS: Dg Chest 2 View  Result Date: 12/23/2018 CLINICAL DATA:  Fever and chills EXAM: CHEST - 2 VIEW COMPARISON:  February 03, 2017 FINDINGS: The lungs are clear. The heart size and pulmonary vascularity are normal. No adenopathy. There is degenerative change in the lower thoracic spine. IMPRESSION: No edema or consolidation. Electronically Signed   By: Lowella Grip III M.D.   On: 12/23/2018 10:14   Dg Knee 1-2 Views Right  Result Date: 12/02/2018 CLINICAL DATA:  RIGHT knee pain and swelling for 2 weeks post fall EXAM: RIGHT KNEE - 1-2 VIEW COMPARISON:   12/31/2017 FINDINGS: Osseous demineralization. Tricompartmental osteoarthritic changes with joint space narrowing and spur formation. Mild subchondral cyst formation at medial compartment, which demonstrates the greatest degree of narrowing. No acute fracture, dislocation, or bone destruction. No knee joint effusion. Scattered atherosclerotic calcifications. IMPRESSION: Tricompartmental osteoarthritic changes of the RIGHT knee greatest at medial compartment. Electronically Signed   By: Lavonia Dana M.D.   On: 12/02/2018 16:18   US Renal  Result Date: 12/26/2018 CLINICAL DATA:  Acute kidney injury. EXAM: RENAL / URINARY TRACT ULTRASOUND COMPLETE COMPARISON:  None. FINDINGS: Right Kidney: Renal measurements: 10.1 x 4.0 x 5.6 cm = volume: 120 mL. Increased echogenicity in the right kidney without hydronephrosis. There are at least 2 small hypoechoic cysts in the right kidney, largest measuring 1.4 cm. No suspicious renal lesion. Cortical thinning. Left Kidney: Renal measurements: 11.1 x 6.5 x 5.4 cm = volume: 203 mL. Normal echogenicity. Negative for hydronephrosis. Hypoechoic cyst in the lower pole measuring up to 2.2 cm. Cortical thinning. Bladder: Appears normal for degree of bladder distention. IMPRESSION: 1. Negative for hydronephrosis. 2. Cortical thinning in both kidneys. Increased echogenicity in the right kidney. Findings may represent chronic medical renal disease. 3. Bilateral renal cysts. Electronically Signed   By: Markus Daft M.D.   On: 12/26/2018 14:36   Mr Tibia Fibula Left Wo Contrast  Result Date: 12/25/2018 CLINICAL DATA:  Diabetic patient with left lower leg pain and swelling. Chronic wound over the lateral malleolus of the left ankle. EXAM: MRI OF LOWER LEFT EXTREMITY WITHOUT CONTRAST TECHNIQUE: Multiplanar, multisequence MR imaging of the left lower leg was performed. No intravenous contrast was administered. COMPARISON:  None. FINDINGS: Bones/Joint/Cartilage Marrow edema in the distal 5 cm  of the tibia and fibula is identified as seen on the patient's ankle MRI yesterday. Marrow signal is otherwise normal. Ligaments Negative. Muscles and Tendons There is mild fatty atrophy of intrinsic musculature of the lower leg. Intermediate increased T2 signal in all lower leg musculature is compatible with diabetic myopathy/denervation. A small amount of fluid is seen interposed between the soleus and medial gastrocnemius. No intramuscular fluid collection. No muscle tear or strain. Soft tissues Intense subcutaneous edema about the lower legs bilaterally appears worse on the left. IMPRESSION: Marrow edema in the distal 5 cm of the tibia and fibula as seen on MRI yesterday could be secondary to stress change or osteomyelitis. Marrow signal is otherwise normal. Intense subcutaneous edema in the lower legs bilaterally is worse on the left and compatible with dependent change and/or cellulitis. Small volume of fluid interposed between the medial head of gastrocnemius and soleus could be due to dependent change or infection. Intermediate increased T2 signal in all lower leg musculature is most consistent with diabetic myopathy and denervation. No intramuscular fluid collection. Electronically Signed   By: Marcello Moores  Dalessio M.D.   On: 12/25/2018 12:03   Mr Ankle Left Wo Contrast  Result Date: 12/25/2018 CLINICAL DATA:  Cellulitis with swelling bacteremia of the foot. EXAM: MRI OF THE LEFT ANKLE WITHOUT CONTRAST TECHNIQUE: Multiplanar, multisequence MR imaging of the ankle was performed. No intravenous contrast was administered. COMPARISON:  CT scan 09/16/2018 FINDINGS: TENDONS Peroneal: Thickened common peroneus tendon sheath posterior to the lateral malleolus. Moderate peroneus longus tendinopathy. Posteromedial: Prominent distal tibialis posterior tendinopathy. Ill definition the flexor digitorum longus in the vicinity of the knot of Henry suggesting partial tearing. Anterior: Tendinopathy of the tibialis anterior  and extensor hallucis longus. Achilles: Fusiform thickening and accentuated signal in the distal Achilles tendon compatible with mild tendinopathy. Plantar Fascia: Abnormal thickening of the medial band of the plantar fascia with adjacent edema with plantar fasciitis. LIGAMENTS Lateral: The talus is dislocated, and thus the talofibular ligaments are torn. The inferior tibiofibular ligaments are indistinct. The calcaneofibular ligament is indistinct. There is some marrow edema in the calcaneus near the expected attachment site of the calcaneofibular ligament. Medial: The medial malleolus is thought to have fragmented. Amorphous soft tissue in the expected location of the tibiospring and tibionavicular portions of the deltoid ligament. Obviously the deep tibiotalar portion of the deltoid ligament is completely torn. CARTILAGE Ankle Joint and subtalar joint: Part of the talus, possibly constituting a portion of the talar dome, is dislocated about 4.5 cm anteriorly from its expected location, and demonstrates regions of low T2 signal possibly from osteonecrosis. Most of the talus S collapsed or necrosis, and in the expected location of the talus there is a 4.3 by 3.0 by 3.3 cm debris-filled cavity. The tibia partially pseudo articulates with the calcaneus, and there is resorption of much of the upper calcaneus as well. Bones: Continuing the list of osseous abnormalities in this case, there is scalloping and thinning of the anterior calcaneus full long within eroded cuboid bone, as well as fragmentation and collapse of the middle and lateral cuneiform spur with severe irregularity, spurring, and erosions along the Lisfranc joint at the second through fifth digits. A bony fragment from the middle cuneiform may have fused in with the medial cuneiform. I favor that most of the findings seen in this case are neuropathic due to Charcot hindfoot and midfoot. If the patient has an occult draining sinus tract in the may be an  indicator of a component of infection. Other: Extensive subcutaneous edema along the foot which may be reactive or due to cellulitis. IMPRESSION: 1. Severe Charcot arthropathy of the hindfoot and midfoot, with dislocation of the talus; destruction of much of the talus; resorption/destruction of the middle and lateral cuneiforms; extensive erosion of the anterior calcaneus and portions of the cuboid; extensive erosions along the bases of the metatarsals; interval fragmentation of the medial malleolus; and concomitant ligamentous disruptions. 2. Diffuse subcutaneous edema in the ankle may be reactive or due to cellulitis. 3. Flexor and extensor tendinopathy is noted above. 4. Achilles tendinopathy distally. 5. Plantar fasciitis. Electronically Signed   By: Van Clines M.D.   On: 12/25/2018 08:27   [name] Limited Joint Space Structures Low Right  Result Date: 12/18/2018 No images saved   Ir Fluoro Guide Cv Line Right  Result Date: 12/29/2018 CLINICAL DATA:  Left foot infection, endocarditis and need for long-term IV antibiotic therapy. Tunneled jugular central line placement requested due to chronic kidney disease and relative contraindication to placement of a PICC line in the arms. EXAM: TUNNELED CENTRAL VENOUS CATHETER PLACEMENT WITH  ULTRASOUND AND FLUOROSCOPIC GUIDANCE ANESTHESIA/SEDATION: None MEDICATIONS: None FLUOROSCOPY TIME:  12 seconds.  2.8 mGy. PROCEDURE: The procedure, risks, benefits, and alternatives were explained to the patient. Questions regarding the procedure were encouraged and answered. The patient understands and consents to the procedure. A timeout was performed prior to initiating the procedure. Ultrasound was used to confirm patency of the right internal jugular vein. The right neck and chest were prepped with chlorhexidine in a sterile fashion, and a sterile drape was applied covering the operative field. Maximum barrier sterile technique with sterile gowns and gloves were used for  the procedure. Local anesthesia was provided with 1% lidocaine. After creating a small venotomy incision, a 21 gauge needle was advanced into the right internal jugular vein under direct, real-time ultrasound guidance. Ultrasound image documentation was performed. After securing guidewire access, a 6 French peel-away sheath was placed. A wire was kinked to measure appropriate catheter length. A 6 French dual-lumen tunneled power line was chosen for placement. This was tunneled in a retrograde fashion from the chest wall to the venotomy incision. The catheter was cut to 20 cm based on guidewire measurement. The catheter was then placed through the peel-away sheath and the sheath removed. Final catheter positioning was confirmed and documented with a fluoroscopic spot image. The catheter was aspirated and flushed with saline. The venotomy incision was closed with subcutaneous 4-0 Monocryl. Dermabond was applied to the incision. The catheter exit site was secured with 0-Prolene retention sutures. COMPLICATIONS: None.  No pneumothorax. FINDINGS: After catheter placement, the tip lies at the SVC/RA junction. The catheter aspirates normally and is ready for immediate use. IMPRESSION: Placement of tunneled central venous catheter via the right internal jugular vein. The catheter tip lies at the SVC/RA junction. The catheter is ready for immediate use. Electronically Signed   By: Aletta Edouard M.D.   On: 12/29/2018 15:08   Ir US Guide Vasc Access Right  Result Date: 12/29/2018 CLINICAL DATA:  Left foot infection, endocarditis and need for long-term IV antibiotic therapy. Tunneled jugular central line placement requested due to chronic kidney disease and relative contraindication to placement of a PICC line in the arms. EXAM: TUNNELED CENTRAL VENOUS CATHETER PLACEMENT WITH ULTRASOUND AND FLUOROSCOPIC GUIDANCE ANESTHESIA/SEDATION: None MEDICATIONS: None FLUOROSCOPY TIME:  12 seconds.  2.8 mGy. PROCEDURE: The procedure,  risks, benefits, and alternatives were explained to the patient. Questions regarding the procedure were encouraged and answered. The patient understands and consents to the procedure. A timeout was performed prior to initiating the procedure. Ultrasound was used to confirm patency of the right internal jugular vein. The right neck and chest were prepped with chlorhexidine in a sterile fashion, and a sterile drape was applied covering the operative field. Maximum barrier sterile technique with sterile gowns and gloves were used for the procedure. Local anesthesia was provided with 1% lidocaine. After creating a small venotomy incision, a 21 gauge needle was advanced into the right internal jugular vein under direct, real-time ultrasound guidance. Ultrasound image documentation was performed. After securing guidewire access, a 6 French peel-away sheath was placed. A wire was kinked to measure appropriate catheter length. A 6 French dual-lumen tunneled power line was chosen for placement. This was tunneled in a retrograde fashion from the chest wall to the venotomy incision. The catheter was cut to 20 cm based on guidewire measurement. The catheter was then placed through the peel-away sheath and the sheath removed. Final catheter positioning was confirmed and documented with a fluoroscopic spot image. The catheter was  aspirated and flushed with saline. The venotomy incision was closed with subcutaneous 4-0 Monocryl. Dermabond was applied to the incision. The catheter exit site was secured with 0-Prolene retention sutures. COMPLICATIONS: None.  No pneumothorax. FINDINGS: After catheter placement, the tip lies at the SVC/RA junction. The catheter aspirates normally and is ready for immediate use. IMPRESSION: Placement of tunneled central venous catheter via the right internal jugular vein. The catheter tip lies at the SVC/RA junction. The catheter is ready for immediate use. Electronically Signed   By: Aletta Edouard  M.D.   On: 12/29/2018 15:08   Dg Chest Port 1 View  Result Date: 12/29/2018 CLINICAL DATA:  Shortness of breath. EXAM: PORTABLE CHEST 1 VIEW COMPARISON:  12/28/2018 and 12/23/2018 FINDINGS: Again noted are bilateral airspace densities. Most confluent airspace disease is located in the mid and lower right lung. Heart size appears to be enlarged and stable. Negative for a pneumothorax. Mild blunting at the costophrenic angles and difficult to exclude small pleural effusions. IMPRESSION: Minimal change in the bilateral airspace disease, right side greater than left. Differential diagnosis includes pulmonary edema and/or pneumonia. Question tiny pleural effusions. Electronically Signed   By: Markus Daft M.D.   On: 12/29/2018 09:24   Dg Chest Port 1v Same Day  Result Date: 12/28/2018 CLINICAL DATA:  Shortness of breath after transesophageal echo EXAM: PORTABLE CHEST 1 VIEW COMPARISON:  December 23, 2018 FINDINGS: There is new infiltrate in the right base. There is mild new opacity in left perihilar region and left base. No pneumothorax. No change in the cardiomediastinal silhouette. No other acute abnormalities. IMPRESSION: Bilateral pulmonary infiltrates worrisome for aspiration or pneumonia. Recommend clinical correlation and follow-up to resolution. Electronically Signed   By: Dorise Bullion III M.D   On: 12/28/2018 11:57   Vas [name] Lower Extremity Venous (dvt)  Result Date: 12/25/2018  Lower Venous Study Indications: Swelling.  Limitations: Body habitus. Performing Technologist: Antonieta Pert RDMS, RVT  Examination Guidelines: A complete evaluation includes B-mode imaging, spectral Doppler, color Doppler, and power Doppler as needed of all accessible portions of each vessel. Bilateral testing is considered an integral part of a complete examination. Limited examinations for reoccurring indications may be performed as noted.  Right Venous Findings:  +---+---------------+---------+-----------+----------+-------+    CompressibilityPhasicitySpontaneityPropertiesSummary +---+---------------+---------+-----------+----------+-------+ CFVFull           Yes      Yes                          +---+---------------+---------+-----------+----------+-------+  Left Venous Findings: +---------+---------------+---------+-----------+----------+--------------+          CompressibilityPhasicitySpontaneityPropertiesSummary        +---------+---------------+---------+-----------+----------+--------------+ CFV      Full           Yes      Yes                                 +---------+---------------+---------+-----------+----------+--------------+ SFJ      Full                                                        +---------+---------------+---------+-----------+----------+--------------+ FV Prox  Full                                                        +---------+---------------+---------+-----------+----------+--------------+  FV Mid   Full                                                        +---------+---------------+---------+-----------+----------+--------------+ FV DistalFull                                                        +---------+---------------+---------+-----------+----------+--------------+ PFV      Full                                                        +---------+---------------+---------+-----------+----------+--------------+ POP      Full           Yes      Yes                                 +---------+---------------+---------+-----------+----------+--------------+ PTV                                                   Not visualized +---------+---------------+---------+-----------+----------+--------------+ PERO                                                  Not visualized +---------+---------------+---------+-----------+----------+--------------+ GSV      Full                                                         +---------+---------------+---------+-----------+----------+--------------+    Summary: Right: No evidence of common femoral vein obstruction. Left: There is no evidence of deep vein thrombosis in the lower extremity. However, portions of this examination were limited- see technologist comments above. No cystic structure found in the popliteal fossa. Prominent lymph nodes noted, largest measuring 3.2cm  *See table(s) above for measurements and observations. Electronically signed by Deitra Mayo MD on 12/25/2018 at 2:35:28 PM.    Final      LOS: 6 days   Oren Binet, MD  [facility]  If 7PM-7AM, please contact night-coverage  Please page via [email or web address]  Go to [email or web address] and use [facility]'s universal password to access. If you do not have the password, please contact the hospital operator.  Locate the [facility] provider you are looking for under [facility] and page to a number that you can be directly reached. If you still have difficulty reaching the provider, please page the [facility] (Director on Call) for the Hospitalists listed on amion for assistance.  12/30/2018, 9:24 AM

## 2018-12-30 NOTE — Plan of Care (Signed)
Patient is making progress toward discharge, no acute distress noted.

## 2018-12-30 NOTE — Consult Note (Signed)
ORTHOPAEDIC CONSULTATION  REQUESTING PHYSICIAN: Jonetta Osgood, MD  Chief Complaint: Ulceration left fibula with Charcot collapse.  HPI: Rachel Holmes is a 58 y.o. female who presents with diabetic insensate neuropathy and Charcot collapse with complete destruction of the talus.  Patient has complete supination and varus of the foot and has an ulcer over the fibula with a central weightbearing on the fibula.  Patient states she was initially seen by podiatry and has been undergoing wound care dressing changes for the fibular ulcer.  She states that she has been getting dressing changes and she has a boot for ambulation.  Past Medical History:  Diagnosis Date  . Allergic rhinitis   . Anemia   . Anxiety   . Back pain   . Bradycardia   . Breast mass    Patient can no longer palpate specific masses but showed tech general area of concern  . CHF (congestive heart failure) ([place])   . CKD (chronic kidney disease)    STAGE 3  . Constipation   . Diabetes mellitus without complication ([place])   . Dyspnea   . GERD (gastroesophageal reflux disease)   . HLD (hyperlipidemia)   . Hyperparathyroidism ([place])   . Hypertension   . Joint pain   . Leg edema   . Legally blind in left eye, as defined in [place]   . Lymphedema   . Onychomycosis    Past Surgical History:  Procedure Laterality Date  . ABDOMINAL HYSTERECTOMY    . BREAST BIOPSY Left 2014   FNA 12:00 position - Negative  . EYE SURGERY    . IR FLUORO GUIDE CV LINE RIGHT  12/29/2018  . IR US GUIDE VASC ACCESS RIGHT  12/29/2018  . TEE WITHOUT CARDIOVERSION N/A 12/28/2018   Procedure: TRANSESOPHAGEAL ECHOCARDIOGRAM (TEE);  Surgeon: Sueanne Margarita, MD;  [facility] ENDOSCOPY;  Service: Cardiovascular;  Laterality: N/A;   Social History   Socioeconomic History  . Marital status: Divorced    Spouse name: Not on file  . Number of children: Not on file  . Years of education: Not on file  . Highest education level: Not on file   Occupational History  . Occupation: Glass blower/designer  Social Needs  . Financial resource strain: Not on file  . Food insecurity:    Worry: Not on file    Inability: Not on file  . Transportation needs:    Medical: Not on file    Non-medical: Not on file  Tobacco Use  . Smoking status: Never Smoker  . Smokeless tobacco: Never Used  Substance and Sexual Activity  . Alcohol use: No  . Drug use: Never  . Sexual activity: Not on file  Lifestyle  . Physical activity:    Days per week: Not on file    Minutes per session: Not on file  . Stress: Not on file  Relationships  . Social connections:    Talks on phone: Not on file    Gets together: Not on file    Attends religious service: Not on file    Active member of club or organization: Not on file    Attends meetings of clubs or organizations: Not on file    Relationship status: Not on file  Other Topics Concern  . Not on file  Social History Narrative   ** Merged History Encounter **       Family History  Problem Relation Age of Onset  . Breast cancer Sister 81  .  Diabetes Sister   . Diabetes Mother   . Hypertension Mother   . Hyperlipidemia Mother   . Eating disorder Mother   . Obesity Mother    - negative except otherwise stated in the family history section Allergies  Allergen Reactions  . Statins Shortness Of Breath    Wheezing, short of breath  . Codeine Nausea Only  . Ibuprofen Other (See Comments)    Reaction:  Raises pts BP  . Penicillins Hives and Other (See Comments)    Has patient had a PCN reaction causing immediate rash, facial/tongue/throat swelling, SOB or lightheadedness with hypotension: No Has patient had a PCN reaction causing severe rash involving mucus membranes or skin necrosis: No Has patient had a PCN reaction that required hospitalization No Has patient had a PCN reaction occurring within the last 10 years: No If all of the above answers are "NO", then may proceed with Cephalosporin use.   [name] Percocet [Oxycodone-Acetaminophen] Nausea Only  . Tramadol Nausea Only    Can take if she has eaten  . Vicodin [Hydrocodone-Acetaminophen] Nausea Only   Prior to Admission medications   Medication Sig Start Date End Date Taking? Authorizing Provider  albuterol (PROVENTIL HFA;VENTOLIN HFA) 108 (90 Base) MCG/ACT inhaler Inhale 1-2 puffs into the lungs every 6 (six) hours as needed for wheezing or shortness of breath.    [provider]  amLODipine (NORVASC) 10 MG tablet Take 10 mg by mouth daily.    [provider]  aspirin EC 325 MG tablet Take 325 mg by mouth daily.    [provider]  Blood Glucose Monitoring Suppl (ONE TOUCH ULTRA 2) w/Device KIT 1 Device by Does not apply route daily. 03/26/18   Renato Shin, MD  cloNIDine (CATAPRES) 0.3 MG tablet Take 0.3 mg by mouth 2 (two) times daily.    [provider]  dapagliflozin propanediol (FARXIGA) 5 MG TABS tablet Take 5 mg by mouth daily. Patient not taking: Reported on 12/23/2018 09/25/18   Renato Shin, MD  Dulaglutide (TRULICITY) 1.5 RF/7.5OI SOPN Inject 1.5 mg into the skin once a week. Patient taking differently: Inject 1.5 mg into the skin once a week. sunday 08/21/18   Renato Shin, MD  DULoxetine (CYMBALTA) 30 MG capsule Take 30 mg by mouth daily.  08/28/18   [provider]  gabapentin (NEURONTIN) 600 MG tablet Take 600 mg by mouth at bedtime.    [provider]  glucose blood (ONETOUCH VERIO) test strip 1 each by Other route 2 (two) times daily. And lancets 1/day 11/25/18   Renato Shin, MD  isosorbide mononitrate (IMDUR) 120 MG 24 hr tablet Take 120 mg by mouth daily.    [provider]  Lancets [facility] ULTRASOFT) lancets Used to check blood sugars four times daily. 03/26/18   Renato Shin, MD  Multiple Vitamin (MULTIVITAMIN WITH MINERALS) TABS tablet Take 1 tablet by mouth daily.    [provider]  omeprazole (PRILOSEC) 40 MG capsule Take 40 mg by mouth  2 (two) times daily.    [provider]  repaglinide (PRANDIN) 1 MG tablet Take 1 tablet (1 mg total) by mouth 3 (three) times daily before meals. 11/30/18   Renato Shin, MD  spironolactone (ALDACTONE) 50 MG tablet Take 50 mg by mouth daily.    [provider]  traMADol (ULTRAM) 50 MG tablet Take 50 mg by mouth every 6 (six) hours as needed for moderate pain or severe pain (Pain). Take with food    [provider]  Vitamin D, Ergocalciferol, (DRISDOL) 1.25 MG (50000 UT) CAPS capsule Take 1 capsule (50,000 Units total) by mouth every 7 (seven) days. Patient not taking: Reported on 12/23/2018 09/21/18   Starlyn Skeans, MD   Ir Cyndy Freeze Guide Cv Line Right  Result Date: 12/29/2018 CLINICAL DATA:  Left foot infection, endocarditis and need for long-term IV antibiotic therapy. Tunneled jugular central line placement requested due to chronic kidney disease and relative contraindication to placement of a PICC line in the arms. EXAM: TUNNELED CENTRAL VENOUS CATHETER PLACEMENT WITH ULTRASOUND AND FLUOROSCOPIC GUIDANCE ANESTHESIA/SEDATION: None MEDICATIONS: None FLUOROSCOPY TIME:  12 seconds.  2.8 mGy. PROCEDURE: The procedure, risks, benefits, and alternatives were explained to the patient. Questions regarding the procedure were encouraged and answered. The patient understands and consents to the procedure. A timeout was performed prior to initiating the procedure. Ultrasound was used to confirm patency of the right internal jugular vein. The right neck and chest were prepped with chlorhexidine in a sterile fashion, and a sterile drape was applied covering the operative field. Maximum barrier sterile technique with sterile gowns and gloves were used for the procedure. Local anesthesia was provided with 1% lidocaine. After creating a small venotomy incision, a 21 gauge needle was advanced into the right internal jugular vein under direct, real-time ultrasound guidance. Ultrasound image  documentation was performed. After securing guidewire access, a 6 French peel-away sheath was placed. A wire was kinked to measure appropriate catheter length. A 6 French dual-lumen tunneled power line was chosen for placement. This was tunneled in a retrograde fashion from the chest wall to the venotomy incision. The catheter was cut to 20 cm based on guidewire measurement. The catheter was then placed through the peel-away sheath and the sheath removed. Final catheter positioning was confirmed and documented with a fluoroscopic spot image. The catheter was aspirated and flushed with saline. The venotomy incision was closed with subcutaneous 4-0 Monocryl. Dermabond was applied to the incision. The catheter exit site was secured with 0-Prolene retention sutures. COMPLICATIONS: None.  No pneumothorax. FINDINGS: After catheter placement, the tip lies at the SVC/RA junction. The catheter aspirates normally and is ready for immediate use. IMPRESSION: Placement of tunneled central venous catheter via the right internal jugular vein. The catheter tip lies at the SVC/RA junction. The catheter is ready for immediate use. Electronically Signed   By: Aletta Edouard M.D.   On: 12/29/2018 15:08   Ir US Guide Vasc Access Right  Result Date: 12/29/2018 CLINICAL DATA:  Left foot infection, endocarditis and need for long-term IV antibiotic therapy. Tunneled jugular central line placement requested due to chronic kidney disease and relative contraindication to placement of a PICC line in the arms. EXAM: TUNNELED CENTRAL VENOUS CATHETER PLACEMENT WITH ULTRASOUND AND FLUOROSCOPIC GUIDANCE ANESTHESIA/SEDATION: None MEDICATIONS: None FLUOROSCOPY TIME:  12 seconds.  2.8 mGy. PROCEDURE: The procedure, risks, benefits, and alternatives were explained to the patient. Questions regarding the procedure were encouraged and answered. The patient understands and consents to the procedure. A timeout was performed prior to initiating the  procedure. Ultrasound was used to confirm patency of the right internal jugular vein. The right neck and chest were prepped with chlorhexidine in a sterile fashion, and a sterile drape was applied covering the operative field. Maximum barrier sterile technique with sterile gowns and gloves were used for the procedure. Local anesthesia was provided with 1% lidocaine. After creating a small venotomy incision, a 21 gauge needle was advanced into the right internal jugular vein under direct, real-time ultrasound  guidance. Ultrasound image documentation was performed. After securing guidewire access, a 6 French peel-away sheath was placed. A wire was kinked to measure appropriate catheter length. A 6 French dual-lumen tunneled power line was chosen for placement. This was tunneled in a retrograde fashion from the chest wall to the venotomy incision. The catheter was cut to 20 cm based on guidewire measurement. The catheter was then placed through the peel-away sheath and the sheath removed. Final catheter positioning was confirmed and documented with a fluoroscopic spot image. The catheter was aspirated and flushed with saline. The venotomy incision was closed with subcutaneous 4-0 Monocryl. Dermabond was applied to the incision. The catheter exit site was secured with 0-Prolene retention sutures. COMPLICATIONS: None.  No pneumothorax. FINDINGS: After catheter placement, the tip lies at the SVC/RA junction. The catheter aspirates normally and is ready for immediate use. IMPRESSION: Placement of tunneled central venous catheter via the right internal jugular vein. The catheter tip lies at the SVC/RA junction. The catheter is ready for immediate use. Electronically Signed   By: Aletta Edouard M.D.   On: 12/29/2018 15:08   Dg Chest Port 1 View  Result Date: 12/30/2018 CLINICAL DATA:  Shortness of breath and bacteremia EXAM: PORTABLE CHEST 1 VIEW COMPARISON:  12/29/2018 FINDINGS: Lordotic technique is demonstrated.  Right IJ central venous catheter is present with tip over the SVC. Lungs are hypoinflated with patchy airspace opacification over the mid to lower lungs right worse than left suggesting multifocal pneumonia. No effusion. Mild stable cardiomegaly. Remainder of the exam is unchanged. IMPRESSION: Stable patchy bilateral multifocal airspace process right worse than left likely multifocal pneumonia. Mild stable cardiomegaly. Electronically Signed   By: Marin Olp M.D.   On: 12/30/2018 11:22   Dg Chest Port 1 View  Result Date: 12/29/2018 CLINICAL DATA:  Shortness of breath. EXAM: PORTABLE CHEST 1 VIEW COMPARISON:  12/28/2018 and 12/23/2018 FINDINGS: Again noted are bilateral airspace densities. Most confluent airspace disease is located in the mid and lower right lung. Heart size appears to be enlarged and stable. Negative for a pneumothorax. Mild blunting at the costophrenic angles and difficult to exclude small pleural effusions. IMPRESSION: Minimal change in the bilateral airspace disease, right side greater than left. Differential diagnosis includes pulmonary edema and/or pneumonia. Question tiny pleural effusions. Electronically Signed   By: Markus Daft M.D.   On: 12/29/2018 09:24   - pertinent xrays, CT, MRI studies were reviewed and independently interpreted  Positive ROS: All other systems have been reviewed and were otherwise negative with the exception of those mentioned in the HPI and as above.  Physical Exam: General: Alert, no acute distress Psychiatric: Patient is competent for consent with normal mood and affect Lymphatic: No axillary or cervical lymphadenopathy Cardiovascular: No pedal edema Respiratory: No cyanosis, no use of accessory musculature GI: No organomegaly, abdomen is soft and non-tender    Images:  @ENCIMAGES @  Labs:  Lab Results  Component Value Date   HGBA1C 7.6 (A) 11/25/2018   HGBA1C 8.5 (A) 09/25/2018   HGBA1C 10.1 (A) 07/21/2018   ESRSEDRATE 102 (H)  12/26/2018   ESRSEDRATE 65 (H) 12/24/2018   CRP 29.7 (H) 12/26/2018   CRP 20.6 (H) 12/24/2018   REPTSTATUS 12/26/2018 FINAL 12/25/2018   CULT  12/25/2018    NO GROWTH Performed at [facility], [place] [address] [place],  [address]    LABORGA STREPTOCOCCUS GROUP G 12/23/2018    Lab Results  Component Value Date   ALBUMIN 2.3 (L) 12/28/2018  ALBUMIN 2.6 (L) 12/24/2018   ALBUMIN 3.9 12/23/2018    Neurologic: Patient does not have protective sensation bilateral lower extremities.   MUSCULOSKELETAL:   Skin: Examination there is no redness no cellulitis there is superficial epithelialization over the ulcer over the tip of the fibula.  I cannot palpate a dorsalis pedis pulse due to the swelling but patient does have  good warmth in the left foot.  The ankle is completely unstable with the ankle in supination and varus with essentially weightbearing on the fibula.  Review of the MRI scan shows complete destruction of the talus with fluid within the ankle joint most consistent with the Charcot arthropathy.  Patient has severe protein caloric malnutrition and improving hemoglobin A1c which has ranged from 10 to currently 7.6.  Assessment: Assessment: Diabetic insensate neuropathy with severe protein caloric malnutrition with Charcot collapse of the left ankle essentially weightbearing on the fibula.  Plan: Plan: Discussed patient should wear the boot and minimize her weightbearing on the left foot.  Discussed that with weightbearing she will completely eroded through the skin to develop osteomyelitis of the fibula and will not have any foot salvage options available.  Discussed that she has 2 options 1 to proceed with open reduction internal fixation for fusion of the tibia to the calcaneus versus a transtibial amputation.  Patient states she would like to try to proceed with foot salvage.  I will reevaluate the patient when she has been discharged from the hospital and evaluate  for surgical stabilization and foot salvage.  Patient will need to improve her nutritional status and glucose control status in order to proceed with surgery.  Thank you for the consult and the opportunity to see Rachel Holmes, Rachel Holmes [phone] 5:03 PM

## 2018-12-30 NOTE — Care Management Important Message (Signed)
Important Message  Patient Details  Name: Rachel Holmes MRN: [id number] Date of Birth: Dec 27, 1960   Medicare Important Message Given:  Yes    [name] 12/30/2018, 3:58 PM

## 2018-12-30 NOTE — Care Management Note (Addendum)
Case Management Note  Patient Details  Name: Rachel Holmes MRN: [id number] Date of Birth: 1961-07-06  Subjective/Objective:    Sepsis secondary to group G streptococcal bacteremia with aortic valve endocarditis and left leg soft tissue infection.  Hx of hypertension, peripheral neuropathy. From home with husband. [name] (Sister) [name] (Sister)        [phone]               [name] (Spouse)        [phone]       PCP:Dr. Salome Holmes  Action/Plan: Transition to home with home health [facility]. Pt requiring LT IVABX therapy.  Pt has transportation to home. Expected Discharge Date:   12/31/2018            Expected Discharge Plan:  [place]  In-House Referral:  NA  Discharge planning Services  CM Consult  Post Acute Care Choice:    Choice offered to:  Patient  DME Arranged:  IV pump/equipment(IV ABX THERAPY) DME Agency:  [place]:  RN,PT [place] Agency:  [place]  Status of Service:  COMPLETED  If discussed at [place] of Stay Meetings, dates discussed:    Additional Comments:  Sharin Mons, RN 12/30/2018, 1:43 PM

## 2018-12-31 ENCOUNTER — Encounter (HOSPITAL_COMMUNITY): Payer: Self-pay | Admitting: General Practice

## 2018-12-31 DIAGNOSIS — M14672 Charcot's joint, left ankle and foot: Secondary | ICD-10-CM

## 2018-12-31 DIAGNOSIS — I358 Other nonrheumatic aortic valve disorders: Secondary | ICD-10-CM

## 2018-12-31 LAB — BASIC METABOLIC PANEL
Anion gap: 13 (ref 5–15)
BUN: 38 mg/dL — ABNORMAL HIGH (ref 6–20)
CO2: 27 mmol/L (ref 22–32)
Calcium: 8.6 mg/dL — ABNORMAL LOW (ref 8.9–10.3)
Chloride: 95 mmol/L — ABNORMAL LOW (ref 98–111)
Creatinine, Ser: 2.69 mg/dL — ABNORMAL HIGH (ref 0.44–1.00)
GFR calc Af Amer: 22 mL/min — ABNORMAL LOW (ref >60)
GFR calc non Af Amer: 19 mL/min — ABNORMAL LOW (ref >60)
Glucose, Bld: 186 mg/dL — ABNORMAL HIGH (ref 70–99)
Potassium: 3.6 mmol/L (ref 3.5–5.1)
Sodium: 135 mmol/L (ref 135–145)

## 2018-12-31 LAB — CBC
HCT: 26.2 % — ABNORMAL LOW (ref 36.0–46.0)
Hemoglobin: 8.3 g/dL — ABNORMAL LOW (ref 12.0–15.0)
MCH: 28.8 pg (ref 26.0–34.0)
MCHC: 31.7 g/dL (ref 30.0–36.0)
MCV: 91 fL (ref 80.0–100.0)
Platelets: 306 10*3/uL (ref 150–400)
RBC: 2.88 MIL/uL — ABNORMAL LOW (ref 3.87–5.11)
RDW: 12.9 % (ref 11.5–15.5)
WBC: 10.6 10*3/uL — ABNORMAL HIGH (ref 4.0–10.5)
nRBC: 0 % (ref 0.0–0.2)

## 2018-12-31 LAB — GLUCOSE, CAPILLARY: Glucose-Capillary: 155 mg/dL — ABNORMAL HIGH (ref 70–99)

## 2018-12-31 MED ORDER — METRONIDAZOLE 500 MG PO TABS: 500.0000 mg | ORAL_TABLET | Freq: Three times a day (TID) | ORAL | 0 refills | Status: DC

## 2018-12-31 MED ORDER — TORSEMIDE 20 MG PO TABS: 40.0000 mg | ORAL_TABLET | Freq: Every day | ORAL | 0 refills | Status: DC

## 2018-12-31 MED ORDER — CEFAZOLIN IV (FOR PTA / DISCHARGE USE ONLY): 2.0000 g | Freq: Two times a day (BID) | INTRAVENOUS | 0 refills | Status: DC

## 2018-12-31 MED ORDER — SODIUM BICARBONATE 650 MG PO TABS: 650.0000 mg | ORAL_TABLET | Freq: Two times a day (BID) | ORAL | 0 refills | Status: DC

## 2018-12-31 NOTE — Progress Notes (Signed)
Pt BIPAP is PRN visited patients room patient is in no distress at this time.  Spoke with RN if any changes throughout the night we will reassess.

## 2018-12-31 NOTE — Progress Notes (Signed)
Nsg Discharge Note  Admit Date:  12/24/2018 Discharge date: 12/31/2018   Rachel Holmes to be D/C'd Home per MD order.  AVS completed.  Copy for chart, and copy for patient signed, and dated. Patient/caregiver able to verbalize understanding.  Discharge Medication: Allergies as of 12/31/2018      Reactions   Statins Shortness Of Breath   Wheezing, short of breath   Codeine Nausea Only   Ibuprofen Other (See Comments)   Reaction:  Raises pts BP   Penicillins Hives, Other (See Comments)   Has patient had a PCN reaction causing immediate rash, facial/tongue/throat swelling, SOB or lightheadedness with hypotension: No Has patient had a PCN reaction causing severe rash involving mucus membranes or skin necrosis: No Has patient had a PCN reaction that required hospitalization No Has patient had a PCN reaction occurring within the last 10 years: No If all of the above answers are "NO", then may proceed with Cephalosporin use.   Percocet [oxycodone-acetaminophen] Nausea Only   Tramadol Nausea Only   Can take if she has eaten   Vicodin [hydrocodone-acetaminophen] Nausea Only      Medication List    STOP taking these medications   spironolactone 50 MG tablet Commonly known as:  ALDACTONE     TAKE these medications   albuterol 108 (90 Base) MCG/ACT inhaler Commonly known as:  PROVENTIL HFA;VENTOLIN HFA Inhale 1-2 puffs into the lungs every 6 (six) hours as needed for wheezing or shortness of breath.   amLODipine 10 MG tablet Commonly known as:  NORVASC Take 10 mg by mouth daily.   aspirin EC 325 MG tablet Take 325 mg by mouth daily.   ceFAZolin  IVPB Commonly known as:  ANCEF Inject 2 g into the vein every 12 (twelve) hours. Indication:  Endocarditis Last Day of Therapy:  02/11/19 Labs - Once weekly:  CBC/D and BMP Labs - Every other week:  ESR and CRP   cloNIDine 0.3 MG tablet Commonly known as:  CATAPRES Take 0.3 mg by mouth 2 (two) times daily.   dapagliflozin  propanediol 5 MG Tabs tablet Commonly known as:  FARXIGA Take 5 mg by mouth daily.   Dulaglutide 1.5 MG/0.5ML Sopn Commonly known as:  TRULICITY Inject 1.5 mg into the skin once a week. What changed:  additional instructions   DULoxetine 30 MG capsule Commonly known as:  CYMBALTA Take 30 mg by mouth daily.   gabapentin 600 MG tablet Commonly known as:  NEURONTIN Take 600 mg by mouth at bedtime.   glucose blood test strip Commonly known as:  ONETOUCH VERIO 1 each by Other route 2 (two) times daily. And lancets 1/day   isosorbide mononitrate 120 MG 24 hr tablet Commonly known as:  IMDUR Take 120 mg by mouth daily.   metroNIDAZOLE 500 MG tablet Commonly known as:  FLAGYL Take 1 tablet (500 mg total) by mouth every 8 (eight) hours.   multivitamin with minerals Tabs tablet Take 1 tablet by mouth daily.   omeprazole 40 MG capsule Commonly known as:  PRILOSEC Take 40 mg by mouth 2 (two) times daily.   ONE TOUCH ULTRA 2 w/Device Kit 1 Device by Does not apply route daily.   onetouch ultrasoft lancets Used to check blood sugars four times daily.   repaglinide 1 MG tablet Commonly known as:  PRANDIN Take 1 tablet (1 mg total) by mouth 3 (three) times daily before meals.   sodium bicarbonate 650 MG tablet Take 1 tablet (650 mg total) by mouth 2 (  two) times daily.   torsemide 20 MG tablet Commonly known as:  DEMADEX Take 2 tablets (40 mg total) by mouth daily.   traMADol 50 MG tablet Commonly known as:  ULTRAM Take 50 mg by mouth every 6 (six) hours as needed for moderate pain or severe pain (Pain). Take with food   Vitamin D (Ergocalciferol) 1.25 MG (50000 UT) Caps capsule Commonly known as:  DRISDOL Take 1 capsule (50,000 Units total) by mouth every 7 (seven) days.            Home Infusion Instuctions  (From admission, onward)         Start     Ordered   12/31/18 0000  Home infusion instructions [facility] May follow [place] Dosing Protocol;  May administer Cathflo as needed to maintain patency of vascular access device.; Flushing of vascular access device: per [facility] Protocol: 0.9% NaCl pre/post medica...    Question Answer Comment  Instructions May follow [place] Dosing Protocol   Instructions May administer Cathflo as needed to maintain patency of vascular access device.   Instructions Flushing of vascular access device: per [facility] Protocol: 0.9% NaCl pre/post medication administration and prn patency; Heparin 100 u/ml, 62m for implanted ports and Heparin 10u/ml, 516mfor all other central venous catheters.   Instructions May follow [facility] Anaphylaxis Protocol for First Dose Administration in the home: 0.9% NaCl at 25-50 ml/hr to maintain IV access for protocol meds. Epinephrine 0.3 ml IV/IM PRN and Benadryl 25-50 IV/IM PRN s/s of anaphylaxis.   Instructions [facility] Infusion Coordinator (RN) to assist per patient IV care needs in the home PRN.      12/31/18 0940          Discharge Assessment:  Skin clean, dry and intact without evidence of skin break down, no evidence of skin tears noted. IV catheter discontinued intact. Site without signs and symptoms of complications - no redness or edema noted at insertion site, patient denies c/o pain - only slight tenderness at site.  Dressing with slight pressure applied.  D/c Instructions-Education: Discharge instructions given to patient/family with verbalized understanding. D/c education completed with patient/family including follow up instructions, medication list, d/c activities limitations if indicated, with other d/c instructions as indicated by MD - patient able to verbalize understanding, all questions fully answered. Patient instructed to return to ED, call 911, or call MD for any changes in condition.  Patient escorted via [facility] D/[facility] home via private auto. Patient given number for [facility] to call with questions about home med administration.   MaTresa Endo RN 12/31/2018 11:31 AM

## 2018-12-31 NOTE — Progress Notes (Signed)
[facility] Infusion Coordinator has provided in hospital teaching with husband regarding IV ABX administration for independence at home. He did very well with teaching. [facility] prepared for DC today if pt is ready after her 10 AM IV ABX dose.  [facility] will provide [facility] services for pt.   If patient discharges after hours, please call [phone].   Larry Sierras 12/31/2018, 7:49 AM

## 2018-12-31 NOTE — Progress Notes (Signed)
Physical Therapy Treatment Patient Details Name: Rachel Holmes MRN: [id number] DOB: 22-Mar-1961 Today's Date: 12/31/2018    History of Present Illness Patient is a 58 y.o. female-2, hypertension, peripheral neuropathy, Charcot joint involving left  foot presenting with 3-4-day history of fever, myalgias, left leg swelling-evaluated in the ED on 2/19, blood cultures drawn and positive for strep. Pt underwent TEE on 2/24 and went into respiratory failure when extubated, placed on Bipap. Pt with possible aspiration PNA.     PT Comments    Pt presents in recliner chair with family member present. Pt initially declined therapy, but when the benefits were explained she agreed to seated therex before discharge. Pt Cam Boot was poorly fit to her LLE, and upon reapplying the pt stated it felt much better. Pt was educated on NWB precautions. Pt tolerated therex well and was educated on benefit of therex for edema control in BLE. Pt plan to discharge to HHPT is appropriate at this time. Pt/family confirm she has a wheelchair to use for navigating the home. Pt follow up needs deferred to [facility]. Upon exit pt in transfer wheelchair and with nurse going over discharge paperwork to leave the hospital.    Follow Up Recommendations  Home health PT     Equipment Recommendations  None recommended by PT    Recommendations for Other Services       Precautions / Restrictions Precautions Precautions: Fall Precaution Comments: up with assist, use RW for transfers Restrictions Weight Bearing Restrictions: Yes LLE Weight Bearing: Non weight bearing Other Position/Activity Restrictions: per Dr Sloan Leiter, pt will be able to bear wt in CAM boot with wound dressed but Sharol Given to see 2/26 so can get clarification then as well    Mobility  Bed Mobility Overal bed mobility: Modified Independent             General bed mobility comments: able to transition to EOB unassisted  Transfers Overall transfer  level: Needs assistance Equipment used: Rolling walker (2 wheeled) Transfers: Sit to/from [facility] Sit to Stand: Min guard Stand pivot transfers: Min guard       General transfer comment: Pt transfered to transport chair for discharge. Pt unable to hop but able to put minimal weight through LLE for purpose of the transfer.  Ambulation/Gait             General Gait Details: unable   Stairs             Wheelchair Mobility    Modified Rankin (Stroke Patients Only)       Balance Overall balance assessment: Needs assistance Sitting-balance support: No upper extremity supported Sitting balance-Leahy Scale: Normal     Standing balance support: Bilateral upper extremity supported Standing balance-Leahy Scale: Poor Standing balance comment: needs B UE support to maintain standing                            Cognition Arousal/Alertness: Awake/alert Behavior During Therapy: WFL for tasks assessed/performed Overall Cognitive Status: Within Functional Limits for tasks assessed                                        Exercises General Exercises - Lower Extremity Quad Sets: AROM;10 reps;Supine;Both Long Arc Quad: AROM;10 reps;Seated;Both Heel Slides: AROM;10 reps;Supine;Both Hip ABduction/ADduction: AROM;10 reps;Supine;Seated;Both Straight Leg Raises: AROM;Supine;Both;10 reps    General Comments  Pertinent Vitals/Pain Pain Assessment: No/denies pain    Home Living                      Prior Function            PT Goals (current goals can now be found in the care plan section) Acute Rehab PT Goals Patient Stated Goal: to go home PT Goal Formulation: With patient/family Potential to Achieve Goals: Good    Frequency    Min 3X/week      PT Plan Current plan remains appropriate    Co-evaluation              AM-PAC PT "6 Clicks" Mobility   Outcome Measure  Help needed turning  from your back to your side while in a flat bed without using bedrails?: None Help needed moving from lying on your back to sitting on the side of a flat bed without using bedrails?: None Help needed moving to and from a bed to a chair (including a wheelchair)?: A Little Help needed standing up from a chair using your arms (e.g., wheelchair or bedside chair)?: A Little Help needed to walk in hospital room?: Total Help needed climbing 3-5 steps with a railing? : Total 6 Click Score: 16    End of Session   Activity Tolerance: Patient tolerated treatment well Patient left: with family/visitor present(in transport chair with nurse going over discharge paperwork) Nurse Communication: Mobility status PT Visit Diagnosis: Difficulty in walking, not elsewhere classified (R26.2)     Time: 1771-1657 PT Time Calculation (min) (ACUTE ONLY): 22 min  Charges:  $Therapeutic Activity: 8-22 mins                     Maryelizabeth Kaufmann, SPTA   Maryelizabeth Kaufmann 12/31/2018, 12:18 PM

## 2018-12-31 NOTE — Discharge Summary (Addendum)
PATIENT DETAILS Name: Rachel Holmes Age: 58 y.o. Sex: female Date of Birth: 1961-04-27 MRN: [id number]. Admitting Physician: Barb Merino, MD IFO:YDXAJO, Rubbie Battiest, MD  Admit Date: 12/24/2018 Discharge date: 12/31/2018  Recommendations for Outpatient Follow-up:  1. Follow up with PCP in 1-2 weeks 2. Please obtain BMP/CBC in one week 3. ID recommending 6 weeks of cefazolin and stop date of 02/11/2019 4. Please ensure follow up orthopedics, nephrology  Admitted From:  Home  Disposition: Home with home health services   Home Health: Yes  Equipment/Devices: 2/25>>TUNNELED [place]  Discharge Condition: Stable  CODE STATUS: FULL CODE  Diet recommendation:  Heart Healthy / Carb Modified   Brief Summary: See H&P, Labs, Consult and Test reports for all details in brief,Patient is a 58 y.o. female-2, hypertension, peripheral neuropathy, Charcot joint involving left  foot presenting with 3-4-day history of fever, myalgias, left leg swelling-evaluated in the ED on 2/19, blood cultures drawn and subsequently sent home.  Brought back to the ED after blood cultures were positive for group G Streptococcus.  Further evaluation revealed aortic valve endocarditis.  Hospital course complicated by worsening respiratory failure post TEE on 2/25 requiring BiPAP support-felt to have a combination of decompensated diastolic heart failure and aspiration pneumonia.  See below for further details.  Brief Hospital Course: Sepsis secondary to group G streptococcal bacteremia withaortic valve endocarditis and left leg soft tissue infection: Sepsis pathophysiology has resolved, left leg has markedly improved-very minimal swelling (present at baseline).  She is now afebrile.  Lower extremity Dopplers negative for DVT, MRI without any obvious abscess.    Both TTE and TEE confirmed aortic valve endocarditis.  Will consult IR for midline  catheter (history of CKD)-ID recommending 6 weeks of cefazolin and stop date of 02/11/2019.  CT surgery consulted-no indication for surgery.  Acute hypoxic respiratory failure likely secondary to aspiration pneumonia and decompensated diastolic heart failure: Occurred after extubation post TEE on  2/24, required BiPAP briefly.  Started on IV Lasix, Flagyl added-continued on Ancef.  Slowly titrated off oxygen by the day of discharge.  Volume status is markedly improved, she is back to her usual dry weight.  Will transition to [facility] on discharge.  She will be continued on Flagyl for a few more days.   Hypertension:  Fluctuating-we will transition to [facility] on discharge, continue amlodipine, clonidine and Imdur.  Follow and optimize in the outpatient setting.   DM-2: CBGs stable-continue  Prandin and Trulicity.  Follow and optimize in the outpatient setting  Left leg ulceration: Present prior to admission-continue Aquacel dressing and change weekly  History of Charcot arthropathy: Patient interested in establishing care in [place] used to previously followed at [facility] consulted Dr. Eber Hong formal evaluation.  Procedures/Studies: 2/25>>TUNNELED CENTRAL VENOUS CATHETER PLACEMENT  2/24>>TEE  Discharge Diagnoses:  Principal Problem:   Bacteremia due to Streptococcus Active Problems:   Charcot ankle, left   Uncontrolled type 2 diabetes mellitus with polyneuropathy (HCC)   Essential hypertension   CKD (chronic kidney disease) stage 4, GFR 15-29 ml/min (HCC)   Acute renal failure superimposed on stage 3 chronic kidney disease (HCC)   Cellulitis in diabetic foot (HCC)   Streptococcus infection   Aortic valve endocarditis   Discharge Instructions:  Activity:  As tolerated with Full fall precautions use walker/cane & assistance as needed-wear the boot and minimize her weightbearing on the left foot   Discharge Instructions    Diet - low sodium heart healthy  Complete by:  As directed    Discharge instructions   Complete by:  As directed    Follow with Primary MD  Sharyne Peach, MD in 1 week  Follow at ID clinic in next 1-2 weeks  Follow with your nephrologist in next 1-2 weeks  Please get a complete blood count and chemistry panel checked by your Primary MD at your next visit, and again as instructed by your Primary MD.  Get Medicines reviewed and adjusted: Please take all your medications with you for your next visit with your Primary MD  Laboratory/radiological data: Please request your Primary MD to go over all hospital tests and procedure/radiological results at the follow up, please ask your Primary MD to get all Hospital records sent to his/her office.  In some cases, they will be blood work, cultures and biopsy results pending at the time of your discharge. Please request that your primary care M.D. follows up on these results.  Also Note the following: If you experience worsening of your admission symptoms, develop shortness of breath, life threatening emergency, suicidal or homicidal thoughts you must seek medical attention immediately by calling 911 or calling your MD immediately  if symptoms less severe.  You must read complete instructions/literature along with all the possible adverse reactions/side effects for all the Medicines you take and that have been prescribed to you. Take any new Medicines after you have completely understood and accpet all the possible adverse reactions/side effects.   Do not drive when taking Pain medications or sleeping medications (Benzodaizepines)  Do not take more than prescribed Pain, Sleep and Anxiety Medications. It is not advisable to combine anxiety,sleep and pain medications without talking with your primary care practitioner  Special Instructions: If you have smoked or chewed Tobacco  in the last 2 yrs please stop smoking, stop any regular Alcohol  and or any Recreational drug  use.  Wear Seat belts while driving.  Please note: You were cared for by a hospitalist during your hospital stay. Once you are discharged, your primary care physician will handle any further medical issues. Please note that NO REFILLS for any discharge medications will be authorized once you are discharged, as it is imperative that you return to your primary care physician (or establish a relationship with a primary care physician if you do not have one) for your post hospital discharge needs so that they can reassess your need for medications and monitor your lab values.   Home infusion instructions [facility] May follow [place] Dosing Protocol; May administer Cathflo as needed to maintain patency of vascular access device.; Flushing of vascular access device: per [facility] Protocol: 0.9% NaCl pre/post medica...   Complete by:  As directed    Instructions:  May follow [place] Dosing Protocol   Instructions:  May administer Cathflo as needed to maintain patency of vascular access device.   Instructions:  Flushing of vascular access device: per [facility] Protocol: 0.9% NaCl pre/post medication administration and prn patency; Heparin 100 u/ml, 24m for implanted ports and Heparin 10u/ml, 550mfor all other central venous catheters.   Instructions:  May follow [facility] Anaphylaxis Protocol for First Dose Administration in the home: 0.9% NaCl at 25-50 ml/hr to maintain IV access for protocol meds. Epinephrine 0.3 ml IV/IM PRN and Benadryl 25-50 IV/IM PRN s/s of anaphylaxis.   Instructions:  [facility] Coordinator (RN) to assist per patient IV care needs in the home PRN.   Increase activity slowly  Complete by:  As directed    wear the boot and minimize her weightbearing on the left foot     Allergies as of 12/31/2018      Reactions   Statins Shortness Of Breath   Wheezing, short of breath   Codeine Nausea Only   Ibuprofen Other (See Comments)   Reaction:  Raises pts BP    Penicillins Hives, Other (See Comments)   Has patient had a PCN reaction causing immediate rash, facial/tongue/throat swelling, SOB or lightheadedness with hypotension: No Has patient had a PCN reaction causing severe rash involving mucus membranes or skin necrosis: No Has patient had a PCN reaction that required hospitalization No Has patient had a PCN reaction occurring within the last 10 years: No If all of the above answers are "NO", then may proceed with Cephalosporin use.   Percocet [oxycodone-acetaminophen] Nausea Only   Tramadol Nausea Only   Can take if she has eaten   Vicodin [hydrocodone-acetaminophen] Nausea Only      Medication List    STOP taking these medications   spironolactone 50 MG tablet Commonly known as:  ALDACTONE     TAKE these medications   albuterol 108 (90 Base) MCG/ACT inhaler Commonly known as:  PROVENTIL HFA;VENTOLIN HFA Inhale 1-2 puffs into the lungs every 6 (six) hours as needed for wheezing or shortness of breath.   amLODipine 10 MG tablet Commonly known as:  NORVASC Take 10 mg by mouth daily.   aspirin EC 325 MG tablet Take 325 mg by mouth daily.   ceFAZolin  IVPB Commonly known as:  ANCEF Inject 2 g into the vein every 12 (twelve) hours. Indication:  Endocarditis Last Day of Therapy:  02/11/19 Labs - Once weekly:  CBC/D and BMP Labs - Every other week:  ESR and CRP   cloNIDine 0.3 MG tablet Commonly known as:  CATAPRES Take 0.3 mg by mouth 2 (two) times daily.   dapagliflozin propanediol 5 MG Tabs tablet Commonly known as:  FARXIGA Take 5 mg by mouth daily.   Dulaglutide 1.5 MG/0.5ML Sopn Commonly known as:  TRULICITY Inject 1.5 mg into the skin once a week. What changed:  additional instructions   DULoxetine 30 MG capsule Commonly known as:  CYMBALTA Take 30 mg by mouth daily.   gabapentin 600 MG tablet Commonly known as:  NEURONTIN Take 600 mg by mouth at bedtime.   glucose blood test strip Commonly known as:  ONETOUCH  VERIO 1 each by Other route 2 (two) times daily. And lancets 1/day   isosorbide mononitrate 120 MG 24 hr tablet Commonly known as:  IMDUR Take 120 mg by mouth daily.   metroNIDAZOLE 500 MG tablet Commonly known as:  FLAGYL Take 1 tablet (500 mg total) by mouth every 8 (eight) hours.   multivitamin with minerals Tabs tablet Take 1 tablet by mouth daily.   omeprazole 40 MG capsule Commonly known as:  PRILOSEC Take 40 mg by mouth 2 (two) times daily.   ONE TOUCH ULTRA 2 w/Device Kit 1 Device by Does not apply route daily.   onetouch ultrasoft lancets Used to check blood sugars four times daily.   repaglinide 1 MG tablet Commonly known as:  PRANDIN Take 1 tablet (1 mg total) by mouth 3 (three) times daily before meals.   sodium bicarbonate 650 MG tablet Take 1 tablet (650 mg total) by mouth 2 (two) times daily.   torsemide 20 MG tablet Commonly known as:  DEMADEX Take 2 tablets (40 mg total) by  mouth daily.   traMADol 50 MG tablet Commonly known as:  ULTRAM Take 50 mg by mouth every 6 (six) hours as needed for moderate pain or severe pain (Pain). Take with food   Vitamin D (Ergocalciferol) 1.25 MG (50000 UT) Caps capsule Commonly known as:  DRISDOL Take 1 capsule (50,000 Units total) by mouth every 7 (seven) days.            Home Infusion Instuctions  (From admission, onward)         Start     Ordered   12/31/18 0000  Home infusion instructions [facility] May follow [place] Dosing Protocol; May administer Cathflo as needed to maintain patency of vascular access device.; Flushing of vascular access device: per [facility] Protocol: 0.9% NaCl pre/post medica...    Question Answer Comment  Instructions May follow [place] Dosing Protocol   Instructions May administer Cathflo as needed to maintain patency of vascular access device.   Instructions Flushing of vascular access device: per [facility] Protocol: 0.9% NaCl pre/post medication administration and prn  patency; Heparin 100 u/ml, 72m for implanted ports and Heparin 10u/ml, 581mfor all other central venous catheters.   Instructions May follow [facility] Anaphylaxis Protocol for First Dose Administration in the home: 0.9% NaCl at 25-50 ml/hr to maintain IV access for protocol meds. Epinephrine 0.3 ml IV/IM PRN and Benadryl 25-50 IV/IM PRN s/s of anaphylaxis.   Instructions [facility] Infusion Coordinator (RN) to assist per patient IV care needs in the home PRN.      12/31/18 0940         Follow-up Information    GeSharyne PeachMD. Schedule an appointment as soon as possible for a visit today.   Specialty:  Family Medicine Contact information: [phone]      DuNewt MinionMD Follow up in 1 week(s).   Specialty:  Orthopedic Surgery Contact information: [phone]        SnCarlyle BasquesMD Follow up on 01/18/2019.   Specialty:  Infectious Diseases Why:  appt at 8:45 am Contact information: [address] [place] [phone]        GoCorliss ParishMD. Schedule an appointment as soon as possible for a visit in 1 week(s).   Specialty:  Nephrology Contact information: [phone]        Allergies  Allergen Reactions  . Statins Shortness Of Breath    Wheezing, short of breath  . Codeine Nausea Only  . Ibuprofen Other (See Comments)    Reaction:  Raises pts BP  . Penicillins Hives and Other (See Comments)    Has patient had a PCN reaction causing immediate rash, facial/tongue/throat swelling, SOB or lightheadedness with hypotension: No Has patient had a PCN reaction causing severe rash involving mucus membranes or skin necrosis: No Has patient had a PCN reaction that required hospitalization No Has patient had a PCN reaction occurring within the last 10 years: No If all of the above answers are  "NO", then may proceed with Cephalosporin use.  . Marland Kitchenercocet [Oxycodone-Acetaminophen] Nausea Only  . Tramadol Nausea Only    Can take if she has eaten  . Vicodin [Hydrocodone-Acetaminophen] Nausea Only    Consultations:   ID   Other Procedures/Studies: Dg Chest 2 View  Result Date: 12/23/2018 CLINICAL DATA:  Fever and chills EXAM: CHEST -  2 VIEW COMPARISON:  February 03, 2017 FINDINGS: The lungs are clear. The heart size and pulmonary vascularity are normal. No adenopathy. There is degenerative change in the lower thoracic spine. IMPRESSION: No edema or consolidation. Electronically Signed   By: Lowella Grip III M.D.   On: 12/23/2018 10:14   Dg Knee 1-2 Views Right  Result Date: 12/02/2018 CLINICAL DATA:  RIGHT knee pain and swelling for 2 weeks post fall EXAM: RIGHT KNEE - 1-2 VIEW COMPARISON:  12/31/2017 FINDINGS: Osseous demineralization. Tricompartmental osteoarthritic changes with joint space narrowing and spur formation. Mild subchondral cyst formation at medial compartment, which demonstrates the greatest degree of narrowing. No acute fracture, dislocation, or bone destruction. No knee joint effusion. Scattered atherosclerotic calcifications. IMPRESSION: Tricompartmental osteoarthritic changes of the RIGHT knee greatest at medial compartment. Electronically Signed   By: Lavonia Dana M.D.   On: 12/02/2018 16:18   US Renal  Result Date: 12/26/2018 CLINICAL DATA:  Acute kidney injury. EXAM: RENAL / URINARY TRACT ULTRASOUND COMPLETE COMPARISON:  None. FINDINGS: Right Kidney: Renal measurements: 10.1 x 4.0 x 5.6 cm = volume: 120 mL. Increased echogenicity in the right kidney without hydronephrosis. There are at least 2 small hypoechoic cysts in the right kidney, largest measuring 1.4 cm. No suspicious renal lesion. Cortical thinning. Left Kidney: Renal measurements: 11.1 x 6.5 x 5.4 cm = volume: 203 mL. Normal echogenicity. Negative for hydronephrosis. Hypoechoic cyst in the lower pole  measuring up to 2.2 cm. Cortical thinning. Bladder: Appears normal for degree of bladder distention. IMPRESSION: 1. Negative for hydronephrosis. 2. Cortical thinning in both kidneys. Increased echogenicity in the right kidney. Findings may represent chronic medical renal disease. 3. Bilateral renal cysts. Electronically Signed   By: Markus Daft M.D.   On: 12/26/2018 14:36   Mr Tibia Fibula Left Wo Contrast  Result Date: 12/25/2018 CLINICAL DATA:  Diabetic patient with left lower leg pain and swelling. Chronic wound over the lateral malleolus of the left ankle. EXAM: MRI OF LOWER LEFT EXTREMITY WITHOUT CONTRAST TECHNIQUE: Multiplanar, multisequence MR imaging of the left lower leg was performed. No intravenous contrast was administered. COMPARISON:  None. FINDINGS: Bones/Joint/Cartilage Marrow edema in the distal 5 cm of the tibia and fibula is identified as seen on the patient's ankle MRI yesterday. Marrow signal is otherwise normal. Ligaments Negative. Muscles and Tendons There is mild fatty atrophy of intrinsic musculature of the lower leg. Intermediate increased T2 signal in all lower leg musculature is compatible with diabetic myopathy/denervation. A small amount of fluid is seen interposed between the soleus and medial gastrocnemius. No intramuscular fluid collection. No muscle tear or strain. Soft tissues Intense subcutaneous edema about the lower legs bilaterally appears worse on the left. IMPRESSION: Marrow edema in the distal 5 cm of the tibia and fibula as seen on MRI yesterday could be secondary to stress change or osteomyelitis. Marrow signal is otherwise normal. Intense subcutaneous edema in the lower legs bilaterally is worse on the left and compatible with dependent change and/or cellulitis. Small volume of fluid interposed between the medial head of gastrocnemius and soleus could be due to dependent change or infection. Intermediate increased T2 signal in all lower leg musculature is most  consistent with diabetic myopathy and denervation. No intramuscular fluid collection. Electronically Signed   By: Inge Rise M.D.   On: 12/25/2018 12:03   Mr Ankle Left Wo Contrast  Result Date: 12/25/2018 CLINICAL DATA:  Cellulitis with swelling bacteremia of the foot. EXAM: MRI OF THE LEFT ANKLE WITHOUT CONTRAST  TECHNIQUE: Multiplanar, multisequence MR imaging of the ankle was performed. No intravenous contrast was administered. COMPARISON:  CT scan 09/16/2018 FINDINGS: TENDONS Peroneal: Thickened common peroneus tendon sheath posterior to the lateral malleolus. Moderate peroneus longus tendinopathy. Posteromedial: Prominent distal tibialis posterior tendinopathy. Ill definition the flexor digitorum longus in the vicinity of the knot of Henry suggesting partial tearing. Anterior: Tendinopathy of the tibialis anterior and extensor hallucis longus. Achilles: Fusiform thickening and accentuated signal in the distal Achilles tendon compatible with mild tendinopathy. Plantar Fascia: Abnormal thickening of the medial band of the plantar fascia with adjacent edema with plantar fasciitis. LIGAMENTS Lateral: The talus is dislocated, and thus the talofibular ligaments are torn. The inferior tibiofibular ligaments are indistinct. The calcaneofibular ligament is indistinct. There is some marrow edema in the calcaneus near the expected attachment site of the calcaneofibular ligament. Medial: The medial malleolus is thought to have fragmented. Amorphous soft tissue in the expected location of the tibiospring and tibionavicular portions of the deltoid ligament. Obviously the deep tibiotalar portion of the deltoid ligament is completely torn. CARTILAGE Ankle Joint and subtalar joint: Part of the talus, possibly constituting a portion of the talar dome, is dislocated about 4.5 cm anteriorly from its expected location, and demonstrates regions of low T2 signal possibly from osteonecrosis. Most of the talus S collapsed or  necrosis, and in the expected location of the talus there is a 4.3 by 3.0 by 3.3 cm debris-filled cavity. The tibia partially pseudo articulates with the calcaneus, and there is resorption of much of the upper calcaneus as well. Bones: Continuing the list of osseous abnormalities in this case, there is scalloping and thinning of the anterior calcaneus full long within eroded cuboid bone, as well as fragmentation and collapse of the middle and lateral cuneiform spur with severe irregularity, spurring, and erosions along the Lisfranc joint at the second through fifth digits. A bony fragment from the middle cuneiform may have fused in with the medial cuneiform. I favor that most of the findings seen in this case are neuropathic due to Charcot hindfoot and midfoot. If the patient has an occult draining sinus tract in the may be an indicator of a component of infection. Other: Extensive subcutaneous edema along the foot which may be reactive or due to cellulitis. IMPRESSION: 1. Severe Charcot arthropathy of the hindfoot and midfoot, with dislocation of the talus; destruction of much of the talus; resorption/destruction of the middle and lateral cuneiforms; extensive erosion of the anterior calcaneus and portions of the cuboid; extensive erosions along the bases of the metatarsals; interval fragmentation of the medial malleolus; and concomitant ligamentous disruptions. 2. Diffuse subcutaneous edema in the ankle may be reactive or due to cellulitis. 3. Flexor and extensor tendinopathy is noted above. 4. Achilles tendinopathy distally. 5. Plantar fasciitis. Electronically Signed   By: Van Clines M.D.   On: 12/25/2018 08:27   [name] Limited Joint Space Structures Low Right  Result Date: 12/18/2018 No images saved   Ir Fluoro Guide Cv Line Right  Result Date: 12/29/2018 CLINICAL DATA:  Left foot infection, endocarditis and need for long-term IV antibiotic therapy. Tunneled jugular central line placement requested  due to chronic kidney disease and relative contraindication to placement of a PICC line in the arms. EXAM: TUNNELED CENTRAL VENOUS CATHETER PLACEMENT WITH ULTRASOUND AND FLUOROSCOPIC GUIDANCE ANESTHESIA/SEDATION: None MEDICATIONS: None FLUOROSCOPY TIME:  12 seconds.  2.8 mGy. PROCEDURE: The procedure, risks, benefits, and alternatives were explained to the patient. Questions regarding the procedure were encouraged and answered.  The patient understands and consents to the procedure. A timeout was performed prior to initiating the procedure. Ultrasound was used to confirm patency of the right internal jugular vein. The right neck and chest were prepped with chlorhexidine in a sterile fashion, and a sterile drape was applied covering the operative field. Maximum barrier sterile technique with sterile gowns and gloves were used for the procedure. Local anesthesia was provided with 1% lidocaine. After creating a small venotomy incision, a 21 gauge needle was advanced into the right internal jugular vein under direct, real-time ultrasound guidance. Ultrasound image documentation was performed. After securing guidewire access, a 6 French peel-away sheath was placed. A wire was kinked to measure appropriate catheter length. A 6 French dual-lumen tunneled power line was chosen for placement. This was tunneled in a retrograde fashion from the chest wall to the venotomy incision. The catheter was cut to 20 cm based on guidewire measurement. The catheter was then placed through the peel-away sheath and the sheath removed. Final catheter positioning was confirmed and documented with a fluoroscopic spot image. The catheter was aspirated and flushed with saline. The venotomy incision was closed with subcutaneous 4-0 Monocryl. Dermabond was applied to the incision. The catheter exit site was secured with 0-Prolene retention sutures. COMPLICATIONS: None.  No pneumothorax. FINDINGS: After catheter placement, the tip lies at the  SVC/RA junction. The catheter aspirates normally and is ready for immediate use. IMPRESSION: Placement of tunneled central venous catheter via the right internal jugular vein. The catheter tip lies at the SVC/RA junction. The catheter is ready for immediate use. Electronically Signed   By: Aletta Edouard M.D.   On: 12/29/2018 15:08   Ir US Guide Vasc Access Right  Result Date: 12/29/2018 CLINICAL DATA:  Left foot infection, endocarditis and need for long-term IV antibiotic therapy. Tunneled jugular central line placement requested due to chronic kidney disease and relative contraindication to placement of a PICC line in the arms. EXAM: TUNNELED CENTRAL VENOUS CATHETER PLACEMENT WITH ULTRASOUND AND FLUOROSCOPIC GUIDANCE ANESTHESIA/SEDATION: None MEDICATIONS: None FLUOROSCOPY TIME:  12 seconds.  2.8 mGy. PROCEDURE: The procedure, risks, benefits, and alternatives were explained to the patient. Questions regarding the procedure were encouraged and answered. The patient understands and consents to the procedure. A timeout was performed prior to initiating the procedure. Ultrasound was used to confirm patency of the right internal jugular vein. The right neck and chest were prepped with chlorhexidine in a sterile fashion, and a sterile drape was applied covering the operative field. Maximum barrier sterile technique with sterile gowns and gloves were used for the procedure. Local anesthesia was provided with 1% lidocaine. After creating a small venotomy incision, a 21 gauge needle was advanced into the right internal jugular vein under direct, real-time ultrasound guidance. Ultrasound image documentation was performed. After securing guidewire access, a 6 French peel-away sheath was placed. A wire was kinked to measure appropriate catheter length. A 6 French dual-lumen tunneled power line was chosen for placement. This was tunneled in a retrograde fashion from the chest wall to the venotomy incision. The catheter  was cut to 20 cm based on guidewire measurement. The catheter was then placed through the peel-away sheath and the sheath removed. Final catheter positioning was confirmed and documented with a fluoroscopic spot image. The catheter was aspirated and flushed with saline. The venotomy incision was closed with subcutaneous 4-0 Monocryl. Dermabond was applied to the incision. The catheter exit site was secured with 0-Prolene retention sutures. COMPLICATIONS: None.  No pneumothorax. FINDINGS:  After catheter placement, the tip lies at the SVC/RA junction. The catheter aspirates normally and is ready for immediate use. IMPRESSION: Placement of tunneled central venous catheter via the right internal jugular vein. The catheter tip lies at the SVC/RA junction. The catheter is ready for immediate use. Electronically Signed   By: Aletta Edouard M.D.   On: 12/29/2018 15:08   Dg Chest Port 1 View  Result Date: 12/30/2018 CLINICAL DATA:  Shortness of breath and bacteremia EXAM: PORTABLE CHEST 1 VIEW COMPARISON:  12/29/2018 FINDINGS: Lordotic technique is demonstrated. Right IJ central venous catheter is present with tip over the SVC. Lungs are hypoinflated with patchy airspace opacification over the mid to lower lungs right worse than left suggesting multifocal pneumonia. No effusion. Mild stable cardiomegaly. Remainder of the exam is unchanged. IMPRESSION: Stable patchy bilateral multifocal airspace process right worse than left likely multifocal pneumonia. Mild stable cardiomegaly. Electronically Signed   By: Marin Olp M.D.   On: 12/30/2018 11:22   Dg Chest Port 1 View  Result Date: 12/29/2018 CLINICAL DATA:  Shortness of breath. EXAM: PORTABLE CHEST 1 VIEW COMPARISON:  12/28/2018 and 12/23/2018 FINDINGS: Again noted are bilateral airspace densities. Most confluent airspace disease is located in the mid and lower right lung. Heart size appears to be enlarged and stable. Negative for a pneumothorax. Mild blunting at  the costophrenic angles and difficult to exclude small pleural effusions. IMPRESSION: Minimal change in the bilateral airspace disease, right side greater than left. Differential diagnosis includes pulmonary edema and/or pneumonia. Question tiny pleural effusions. Electronically Signed   By: Markus Daft M.D.   On: 12/29/2018 09:24   Dg Chest Port 1v Same Day  Result Date: 12/28/2018 CLINICAL DATA:  Shortness of breath after transesophageal echo EXAM: PORTABLE CHEST 1 VIEW COMPARISON:  December 23, 2018 FINDINGS: There is new infiltrate in the right base. There is mild new opacity in left perihilar region and left base. No pneumothorax. No change in the cardiomediastinal silhouette. No other acute abnormalities. IMPRESSION: Bilateral pulmonary infiltrates worrisome for aspiration or pneumonia. Recommend clinical correlation and follow-up to resolution. Electronically Signed   By: Dorise Bullion III M.D   On: 12/28/2018 11:57   Vas [name] Lower Extremity Venous (dvt)  Result Date: 12/25/2018  Lower Venous Study Indications: Swelling.  Limitations: Body habitus. Performing Technologist: Antonieta Pert RDMS, RVT  Examination Guidelines: A complete evaluation includes B-mode imaging, spectral Doppler, color Doppler, and power Doppler as needed of all accessible portions of each vessel. Bilateral testing is considered an integral part of a complete examination. Limited examinations for reoccurring indications may be performed as noted.  Right Venous Findings: +---+---------------+---------+-----------+----------+-------+    CompressibilityPhasicitySpontaneityPropertiesSummary +---+---------------+---------+-----------+----------+-------+ CFVFull           Yes      Yes                          +---+---------------+---------+-----------+----------+-------+  Left Venous Findings: +---------+---------------+---------+-----------+----------+--------------+           CompressibilityPhasicitySpontaneityPropertiesSummary        +---------+---------------+---------+-----------+----------+--------------+ CFV      Full           Yes      Yes                                 +---------+---------------+---------+-----------+----------+--------------+ SFJ      Full                                                        +---------+---------------+---------+-----------+----------+--------------+  FV Prox  Full                                                        +---------+---------------+---------+-----------+----------+--------------+ FV Mid   Full                                                        +---------+---------------+---------+-----------+----------+--------------+ FV DistalFull                                                        +---------+---------------+---------+-----------+----------+--------------+ PFV      Full                                                        +---------+---------------+---------+-----------+----------+--------------+ POP      Full           Yes      Yes                                 +---------+---------------+---------+-----------+----------+--------------+ PTV                                                   Not visualized +---------+---------------+---------+-----------+----------+--------------+ PERO                                                  Not visualized +---------+---------------+---------+-----------+----------+--------------+ GSV      Full                                                        +---------+---------------+---------+-----------+----------+--------------+    Summary: Right: No evidence of common femoral vein obstruction. Left: There is no evidence of deep vein thrombosis in the lower extremity. However, portions of this examination were limited- see technologist comments above. No cystic structure found in the popliteal fossa. Prominent  lymph nodes noted, largest measuring 3.2cm  *See table(s) above for measurements and observations. Electronically signed by Deitra Mayo MD on 12/25/2018 at 2:35:28 PM.    Final      TODAY-DAY OF DISCHARGE:  Subjective:   Rachel Holmes today has no headache,no chest abdominal pain,no new weakness tingling or numbness, feels much better wants to go home today.   Objective:   Blood pressure (!) 168/69, pulse 84, temperature 99.3 F (37.4 C), temperature source Oral, resp. rate 18,  height _0  (1.676 m), weight 132.5 kg, SpO2 94 %.  Intake/Output Summary (Last 24 hours) at 12/31/2018 1043 Last data filed at 12/30/2018 2249 Gross per 24 hour  Intake -  Output 1551 ml  Net -1551 ml   Filed Weights   12/30/18 0500 12/30/18 2232 12/31/18 0707  Weight: (!) 137.2 kg 132.3 kg 132.5 kg    Exam: Awake Alert, Oriented *3, No new F.N deficits, Normal affect [place].AT,PERRAL Supple Neck,No JVD, No cervical lymphadenopathy appriciated.  Symmetrical Chest wall movement, Good air movement bilaterally, CTAB RRR,No Gallops,Rubs or new Murmurs, No Parasternal Heave +ve B.Sounds, Abd Soft, Non tender, No organomegaly appriciated, No rebound -guarding or rigidity. No Cyanosis, Clubbing or edema, No new Rash or bruise   PERTINENT RADIOLOGIC STUDIES: Dg Chest 2 View  Result Date: 12/23/2018 CLINICAL DATA:  Fever and chills EXAM: CHEST - 2 VIEW COMPARISON:  February 03, 2017 FINDINGS: The lungs are clear. The heart size and pulmonary vascularity are normal. No adenopathy. There is degenerative change in the lower thoracic spine. IMPRESSION: No edema or consolidation. Electronically Signed   By: Lowella Grip III M.D.   On: 12/23/2018 10:14   Dg Knee 1-2 Views Right  Result Date: 12/02/2018 CLINICAL DATA:  RIGHT knee pain and swelling for 2 weeks post fall EXAM: RIGHT KNEE - 1-2 VIEW COMPARISON:  12/31/2017 FINDINGS: Osseous demineralization. Tricompartmental osteoarthritic changes with joint  space narrowing and spur formation. Mild subchondral cyst formation at medial compartment, which demonstrates the greatest degree of narrowing. No acute fracture, dislocation, or bone destruction. No knee joint effusion. Scattered atherosclerotic calcifications. IMPRESSION: Tricompartmental osteoarthritic changes of the RIGHT knee greatest at medial compartment. Electronically Signed   By: Lavonia Dana M.D.   On: 12/02/2018 16:18   US Renal  Result Date: 12/26/2018 CLINICAL DATA:  Acute kidney injury. EXAM: RENAL / URINARY TRACT ULTRASOUND COMPLETE COMPARISON:  None. FINDINGS: Right Kidney: Renal measurements: 10.1 x 4.0 x 5.6 cm = volume: 120 mL. Increased echogenicity in the right kidney without hydronephrosis. There are at least 2 small hypoechoic cysts in the right kidney, largest measuring 1.4 cm. No suspicious renal lesion. Cortical thinning. Left Kidney: Renal measurements: 11.1 x 6.5 x 5.4 cm = volume: 203 mL. Normal echogenicity. Negative for hydronephrosis. Hypoechoic cyst in the lower pole measuring up to 2.2 cm. Cortical thinning. Bladder: Appears normal for degree of bladder distention. IMPRESSION: 1. Negative for hydronephrosis. 2. Cortical thinning in both kidneys. Increased echogenicity in the right kidney. Findings may represent chronic medical renal disease. 3. Bilateral renal cysts. Electronically Signed   By: Markus Daft M.D.   On: 12/26/2018 14:36   Mr Tibia Fibula Left Wo Contrast  Result Date: 12/25/2018 CLINICAL DATA:  Diabetic patient with left lower leg pain and swelling. Chronic wound over the lateral malleolus of the left ankle. EXAM: MRI OF LOWER LEFT EXTREMITY WITHOUT CONTRAST TECHNIQUE: Multiplanar, multisequence MR imaging of the left lower leg was performed. No intravenous contrast was administered. COMPARISON:  None. FINDINGS: Bones/Joint/Cartilage Marrow edema in the distal 5 cm of the tibia and fibula is identified as seen on the patient's ankle MRI yesterday. Marrow signal  is otherwise normal. Ligaments Negative. Muscles and Tendons There is mild fatty atrophy of intrinsic musculature of the lower leg. Intermediate increased T2 signal in all lower leg musculature is compatible with diabetic myopathy/denervation. A small amount of fluid is seen interposed between the soleus and medial gastrocnemius. No intramuscular fluid collection. No muscle tear or strain. Soft tissues Intense  subcutaneous edema about the lower legs bilaterally appears worse on the left. IMPRESSION: Marrow edema in the distal 5 cm of the tibia and fibula as seen on MRI yesterday could be secondary to stress change or osteomyelitis. Marrow signal is otherwise normal. Intense subcutaneous edema in the lower legs bilaterally is worse on the left and compatible with dependent change and/or cellulitis. Small volume of fluid interposed between the medial head of gastrocnemius and soleus could be due to dependent change or infection. Intermediate increased T2 signal in all lower leg musculature is most consistent with diabetic myopathy and denervation. No intramuscular fluid collection. Electronically Signed   By: Inge Rise M.D.   On: 12/25/2018 12:03   Mr Ankle Left Wo Contrast  Result Date: 12/25/2018 CLINICAL DATA:  Cellulitis with swelling bacteremia of the foot. EXAM: MRI OF THE LEFT ANKLE WITHOUT CONTRAST TECHNIQUE: Multiplanar, multisequence MR imaging of the ankle was performed. No intravenous contrast was administered. COMPARISON:  CT scan 09/16/2018 FINDINGS: TENDONS Peroneal: Thickened common peroneus tendon sheath posterior to the lateral malleolus. Moderate peroneus longus tendinopathy. Posteromedial: Prominent distal tibialis posterior tendinopathy. Ill definition the flexor digitorum longus in the vicinity of the knot of Henry suggesting partial tearing. Anterior: Tendinopathy of the tibialis anterior and extensor hallucis longus. Achilles: Fusiform thickening and accentuated signal in the distal  Achilles tendon compatible with mild tendinopathy. Plantar Fascia: Abnormal thickening of the medial band of the plantar fascia with adjacent edema with plantar fasciitis. LIGAMENTS Lateral: The talus is dislocated, and thus the talofibular ligaments are torn. The inferior tibiofibular ligaments are indistinct. The calcaneofibular ligament is indistinct. There is some marrow edema in the calcaneus near the expected attachment site of the calcaneofibular ligament. Medial: The medial malleolus is thought to have fragmented. Amorphous soft tissue in the expected location of the tibiospring and tibionavicular portions of the deltoid ligament. Obviously the deep tibiotalar portion of the deltoid ligament is completely torn. CARTILAGE Ankle Joint and subtalar joint: Part of the talus, possibly constituting a portion of the talar dome, is dislocated about 4.5 cm anteriorly from its expected location, and demonstrates regions of low T2 signal possibly from osteonecrosis. Most of the talus S collapsed or necrosis, and in the expected location of the talus there is a 4.3 by 3.0 by 3.3 cm debris-filled cavity. The tibia partially pseudo articulates with the calcaneus, and there is resorption of much of the upper calcaneus as well. Bones: Continuing the list of osseous abnormalities in this case, there is scalloping and thinning of the anterior calcaneus full long within eroded cuboid bone, as well as fragmentation and collapse of the middle and lateral cuneiform spur with severe irregularity, spurring, and erosions along the Lisfranc joint at the second through fifth digits. A bony fragment from the middle cuneiform may have fused in with the medial cuneiform. I favor that most of the findings seen in this case are neuropathic due to Charcot hindfoot and midfoot. If the patient has an occult draining sinus tract in the may be an indicator of a component of infection. Other: Extensive subcutaneous edema along the foot which may  be reactive or due to cellulitis. IMPRESSION: 1. Severe Charcot arthropathy of the hindfoot and midfoot, with dislocation of the talus; destruction of much of the talus; resorption/destruction of the middle and lateral cuneiforms; extensive erosion of the anterior calcaneus and portions of the cuboid; extensive erosions along the bases of the metatarsals; interval fragmentation of the medial malleolus; and concomitant ligamentous disruptions. 2. Diffuse subcutaneous  edema in the ankle may be reactive or due to cellulitis. 3. Flexor and extensor tendinopathy is noted above. 4. Achilles tendinopathy distally. 5. Plantar fasciitis. Electronically Signed   By: Van Clines M.D.   On: 12/25/2018 08:27   [name] Limited Joint Space Structures Low Right  Result Date: 12/18/2018 No images saved   Ir Fluoro Guide Cv Line Right  Result Date: 12/29/2018 CLINICAL DATA:  Left foot infection, endocarditis and need for long-term IV antibiotic therapy. Tunneled jugular central line placement requested due to chronic kidney disease and relative contraindication to placement of a PICC line in the arms. EXAM: TUNNELED CENTRAL VENOUS CATHETER PLACEMENT WITH ULTRASOUND AND FLUOROSCOPIC GUIDANCE ANESTHESIA/SEDATION: None MEDICATIONS: None FLUOROSCOPY TIME:  12 seconds.  2.8 mGy. PROCEDURE: The procedure, risks, benefits, and alternatives were explained to the patient. Questions regarding the procedure were encouraged and answered. The patient understands and consents to the procedure. A timeout was performed prior to initiating the procedure. Ultrasound was used to confirm patency of the right internal jugular vein. The right neck and chest were prepped with chlorhexidine in a sterile fashion, and a sterile drape was applied covering the operative field. Maximum barrier sterile technique with sterile gowns and gloves were used for the procedure. Local anesthesia was provided with 1% lidocaine. After creating a small venotomy  incision, a 21 gauge needle was advanced into the right internal jugular vein under direct, real-time ultrasound guidance. Ultrasound image documentation was performed. After securing guidewire access, a 6 French peel-away sheath was placed. A wire was kinked to measure appropriate catheter length. A 6 French dual-lumen tunneled power line was chosen for placement. This was tunneled in a retrograde fashion from the chest wall to the venotomy incision. The catheter was cut to 20 cm based on guidewire measurement. The catheter was then placed through the peel-away sheath and the sheath removed. Final catheter positioning was confirmed and documented with a fluoroscopic spot image. The catheter was aspirated and flushed with saline. The venotomy incision was closed with subcutaneous 4-0 Monocryl. Dermabond was applied to the incision. The catheter exit site was secured with 0-Prolene retention sutures. COMPLICATIONS: None.  No pneumothorax. FINDINGS: After catheter placement, the tip lies at the SVC/RA junction. The catheter aspirates normally and is ready for immediate use. IMPRESSION: Placement of tunneled central venous catheter via the right internal jugular vein. The catheter tip lies at the SVC/RA junction. The catheter is ready for immediate use. Electronically Signed   By: Aletta Edouard M.D.   On: 12/29/2018 15:08   Ir US Guide Vasc Access Right  Result Date: 12/29/2018 CLINICAL DATA:  Left foot infection, endocarditis and need for long-term IV antibiotic therapy. Tunneled jugular central line placement requested due to chronic kidney disease and relative contraindication to placement of a PICC line in the arms. EXAM: TUNNELED CENTRAL VENOUS CATHETER PLACEMENT WITH ULTRASOUND AND FLUOROSCOPIC GUIDANCE ANESTHESIA/SEDATION: None MEDICATIONS: None FLUOROSCOPY TIME:  12 seconds.  2.8 mGy. PROCEDURE: The procedure, risks, benefits, and alternatives were explained to the patient. Questions regarding the  procedure were encouraged and answered. The patient understands and consents to the procedure. A timeout was performed prior to initiating the procedure. Ultrasound was used to confirm patency of the right internal jugular vein. The right neck and chest were prepped with chlorhexidine in a sterile fashion, and a sterile drape was applied covering the operative field. Maximum barrier sterile technique with sterile gowns and gloves were used for the procedure. Local anesthesia was provided with 1% lidocaine. After  creating a small venotomy incision, a 21 gauge needle was advanced into the right internal jugular vein under direct, real-time ultrasound guidance. Ultrasound image documentation was performed. After securing guidewire access, a 6 French peel-away sheath was placed. A wire was kinked to measure appropriate catheter length. A 6 French dual-lumen tunneled power line was chosen for placement. This was tunneled in a retrograde fashion from the chest wall to the venotomy incision. The catheter was cut to 20 cm based on guidewire measurement. The catheter was then placed through the peel-away sheath and the sheath removed. Final catheter positioning was confirmed and documented with a fluoroscopic spot image. The catheter was aspirated and flushed with saline. The venotomy incision was closed with subcutaneous 4-0 Monocryl. Dermabond was applied to the incision. The catheter exit site was secured with 0-Prolene retention sutures. COMPLICATIONS: None.  No pneumothorax. FINDINGS: After catheter placement, the tip lies at the SVC/RA junction. The catheter aspirates normally and is ready for immediate use. IMPRESSION: Placement of tunneled central venous catheter via the right internal jugular vein. The catheter tip lies at the SVC/RA junction. The catheter is ready for immediate use. Electronically Signed   By: Aletta Edouard M.D.   On: 12/29/2018 15:08   Dg Chest Port 1 View  Result Date: 12/30/2018 CLINICAL  DATA:  Shortness of breath and bacteremia EXAM: PORTABLE CHEST 1 VIEW COMPARISON:  12/29/2018 FINDINGS: Lordotic technique is demonstrated. Right IJ central venous catheter is present with tip over the SVC. Lungs are hypoinflated with patchy airspace opacification over the mid to lower lungs right worse than left suggesting multifocal pneumonia. No effusion. Mild stable cardiomegaly. Remainder of the exam is unchanged. IMPRESSION: Stable patchy bilateral multifocal airspace process right worse than left likely multifocal pneumonia. Mild stable cardiomegaly. Electronically Signed   By: Marin Olp M.D.   On: 12/30/2018 11:22   Dg Chest Port 1 View  Result Date: 12/29/2018 CLINICAL DATA:  Shortness of breath. EXAM: PORTABLE CHEST 1 VIEW COMPARISON:  12/28/2018 and 12/23/2018 FINDINGS: Again noted are bilateral airspace densities. Most confluent airspace disease is located in the mid and lower right lung. Heart size appears to be enlarged and stable. Negative for a pneumothorax. Mild blunting at the costophrenic angles and difficult to exclude small pleural effusions. IMPRESSION: Minimal change in the bilateral airspace disease, right side greater than left. Differential diagnosis includes pulmonary edema and/or pneumonia. Question tiny pleural effusions. Electronically Signed   By: Markus Daft M.D.   On: 12/29/2018 09:24   Dg Chest Port 1v Same Day  Result Date: 12/28/2018 CLINICAL DATA:  Shortness of breath after transesophageal echo EXAM: PORTABLE CHEST 1 VIEW COMPARISON:  December 23, 2018 FINDINGS: There is new infiltrate in the right base. There is mild new opacity in left perihilar region and left base. No pneumothorax. No change in the cardiomediastinal silhouette. No other acute abnormalities. IMPRESSION: Bilateral pulmonary infiltrates worrisome for aspiration or pneumonia. Recommend clinical correlation and follow-up to resolution. Electronically Signed   By: Dorise Bullion III M.D   On: 12/28/2018  11:57   Vas [name] Lower Extremity Venous (dvt)  Result Date: 12/25/2018  Lower Venous Study Indications: Swelling.  Limitations: Body habitus. Performing Technologist: Antonieta Pert RDMS, RVT  Examination Guidelines: A complete evaluation includes B-mode imaging, spectral Doppler, color Doppler, and power Doppler as needed of all accessible portions of each vessel. Bilateral testing is considered an integral part of a complete examination. Limited examinations for reoccurring indications may be performed as noted.  Right Venous  Findings: +---+---------------+---------+-----------+----------+-------+    CompressibilityPhasicitySpontaneityPropertiesSummary +---+---------------+---------+-----------+----------+-------+ CFVFull           Yes      Yes                          +---+---------------+---------+-----------+----------+-------+  Left Venous Findings: +---------+---------------+---------+-----------+----------+--------------+          CompressibilityPhasicitySpontaneityPropertiesSummary        +---------+---------------+---------+-----------+----------+--------------+ CFV      Full           Yes      Yes                                 +---------+---------------+---------+-----------+----------+--------------+ SFJ      Full                                                        +---------+---------------+---------+-----------+----------+--------------+ FV Prox  Full                                                        +---------+---------------+---------+-----------+----------+--------------+ FV Mid   Full                                                        +---------+---------------+---------+-----------+----------+--------------+ FV DistalFull                                                        +---------+---------------+---------+-----------+----------+--------------+ PFV      Full                                                         +---------+---------------+---------+-----------+----------+--------------+ POP      Full           Yes      Yes                                 +---------+---------------+---------+-----------+----------+--------------+ PTV                                                   Not visualized +---------+---------------+---------+-----------+----------+--------------+ PERO                                                  Not visualized +---------+---------------+---------+-----------+----------+--------------+  GSV      Full                                                        +---------+---------------+---------+-----------+----------+--------------+    Summary: Right: No evidence of common femoral vein obstruction. Left: There is no evidence of deep vein thrombosis in the lower extremity. However, portions of this examination were limited- see technologist comments above. No cystic structure found in the popliteal fossa. Prominent lymph nodes noted, largest measuring 3.2cm  *See table(s) above for measurements and observations. Electronically signed by Deitra Mayo MD on 12/25/2018 at 2:35:28 PM.    Final      PERTINENT LAB RESULTS: CBC: Recent Labs    12/30/18 0351 12/31/18 0532  WBC 9.9 10.6*  HGB 7.8* 8.3*  HCT 25.8* 26.2*  PLT 248 306   CMET CMP     Component Value Date/Time   NA 135 12/31/2018 0532   NA 139 06/25/2018 1050   NA 137 11/21/2013 0627   K 3.6 12/31/2018 0532   K 4.6 11/21/2013 0627   CL 95 (L) 12/31/2018 0532   CL 107 11/21/2013 0627   CO2 27 12/31/2018 0532   CO2 24 11/21/2013 0627   GLUCOSE 186 (H) 12/31/2018 0532   GLUCOSE 95 11/21/2013 0627   BUN 38 (H) 12/31/2018 0532   BUN 43 (H) 06/25/2018 1050   BUN 34 (H) 11/21/2013 0627   CREATININE 2.69 (H) 12/31/2018 0532   CREATININE 1.80 (H) 09/09/2014 1220   CALCIUM 8.6 (L) 12/31/2018 0532   CALCIUM 8.8 09/09/2014 1220   PROT 6.7 12/28/2018 0512   PROT 6.4 06/25/2018 1050   PROT  7.9 11/19/2013 0934   ALBUMIN 2.3 (L) 12/28/2018 0512   ALBUMIN 3.5 06/25/2018 1050   ALBUMIN 3.2 (L) 11/19/2013 0934   AST 42 (H) 12/28/2018 0512   AST 19 11/19/2013 0934   ALT 56 (H) 12/28/2018 0512   ALT 20 11/19/2013 0934   ALKPHOS 176 (H) 12/28/2018 0512   ALKPHOS 95 11/19/2013 0934   BILITOT 1.1 12/28/2018 0512   BILITOT 0.3 06/25/2018 1050   BILITOT 0.3 11/19/2013 0934   GFRNONAA 19 (L) 12/31/2018 0532   GFRNONAA 31 (L) 09/09/2014 1220   GFRNONAA 26 (L) 11/21/2013 0627   GFRAA 22 (L) 12/31/2018 0532   GFRAA 38 (L) 09/09/2014 1220   GFRAA 30 (L) 11/21/2013 0627    GFR Estimated Creatinine Clearance: 32.3 mL/min (A) (by C-G formula based on SCr of 2.69 mg/dL (H)). No results for input(s): LIPASE, AMYLASE in the last 72 hours. No results for input(s): CKTOTAL, CKMB, CKMBINDEX, TROPONINI in the last 72 hours. Invalid input(s): POCBNP No results for input(s): DDIMER in the last 72 hours. No results for input(s): HGBA1C in the last 72 hours. No results for input(s): CHOL, HDL, LDLCALC, TRIG, CHOLHDL, LDLDIRECT in the last 72 hours. No results for input(s): TSH, T4TOTAL, T3FREE, THYROIDAB in the last 72 hours.  Invalid input(s): FREET3 No results for input(s): VITAMINB12, FOLATE, FERRITIN, TIBC, IRON, RETICCTPCT in the last 72 hours. Coags: No results for input(s): INR in the last 72 hours.  Invalid input(s): PT Microbiology: Recent Results (from the past 240 hour(s))  Group A Strep by PCR     Status: None   Collection Time: 12/23/18  9:38 AM  Result Value Ref Range Status  Group A Strep by PCR NOT DETECTED NOT DETECTED Final    Comment: Performed at [facility], [place] [address], [place], [place] [address]  Culture, blood (single)     Status: Abnormal   Collection Time: 12/23/18  9:40 AM  Result Value Ref Range Status   Specimen Description BLOOD LEFT HAND  Final   Special Requests   Final    BOTTLES DRAWN AEROBIC AND ANAEROBIC Blood Culture adequate volume    Culture  Setup Time   Final    AEROBIC BOTTLE ONLY GRAM POSITIVE COCCI Organism ID to follow CRITICAL RESULT CALLED TO, READ BACK BY AND VERIFIED WITH: K.NEIL,RN AT 6270 ON 12/24/18 BY G.[email or web address] Performed at [facility], [place] [address] [place], [place] [address]    Culture STREPTOCOCCUS GROUP G (A)  Final   Report Status 12/27/2018 FINAL  Final   Organism ID, Bacteria STREPTOCOCCUS GROUP G  Final      Susceptibility   Streptococcus group g - MIC*    CLINDAMYCIN <=0.25 SENSITIVE Sensitive     AMPICILLIN <=0.25 SENSITIVE Sensitive     ERYTHROMYCIN <=0.12 SENSITIVE Sensitive     VANCOMYCIN 0.5 SENSITIVE Sensitive     CEFTRIAXONE <=0.12 SENSITIVE Sensitive     LEVOFLOXACIN 0.5 SENSITIVE Sensitive     * STREPTOCOCCUS GROUP G  Blood Culture ID Panel (Reflexed)     Status: Abnormal   Collection Time: 12/23/18  9:40 AM  Result Value Ref Range Status   Enterococcus species NOT DETECTED NOT DETECTED Final   Listeria monocytogenes NOT DETECTED NOT DETECTED Final   Staphylococcus species NOT DETECTED NOT DETECTED Final   Staphylococcus aureus (BCID) NOT DETECTED NOT DETECTED Final   Streptococcus species DETECTED (A) NOT DETECTED Final    Comment: Not Enterococcus species, Streptococcus agalactiae, Streptococcus pyogenes, or Streptococcus pneumoniae. CRITICAL RESULT CALLED TO, READ BACK BY AND VERIFIED WITH: K.NEIL,RN AT 0247 ON 12/24/18 BY G.MCADOO    Streptococcus agalactiae NOT DETECTED NOT DETECTED Final   Streptococcus pneumoniae NOT DETECTED NOT DETECTED Final   Streptococcus pyogenes NOT DETECTED NOT DETECTED Final   Acinetobacter baumannii NOT DETECTED NOT DETECTED Final   Enterobacteriaceae species NOT DETECTED NOT DETECTED Final   Enterobacter cloacae complex NOT DETECTED NOT DETECTED Final   Escherichia coli NOT DETECTED NOT DETECTED Final   Klebsiella oxytoca NOT DETECTED NOT DETECTED Final   Klebsiella pneumoniae NOT DETECTED NOT DETECTED Final   Proteus species NOT  DETECTED NOT DETECTED Final   Serratia marcescens NOT DETECTED NOT DETECTED Final   Haemophilus influenzae NOT DETECTED NOT DETECTED Final   Neisseria meningitidis NOT DETECTED NOT DETECTED Final   Pseudomonas aeruginosa NOT DETECTED NOT DETECTED Final   Candida albicans NOT DETECTED NOT DETECTED Final   Candida glabrata NOT DETECTED NOT DETECTED Final   Candida krusei NOT DETECTED NOT DETECTED Final   Candida parapsilosis NOT DETECTED NOT DETECTED Final   Candida tropicalis NOT DETECTED NOT DETECTED Final    Comment: Performed at [facility], [place]. [address], [place], [place] [address]  Blood culture (routine x 2)     Status: None   Collection Time: 12/24/18 12:53 PM  Result Value Ref Range Status   Specimen Description BLOOD RIGHT HAND  Final   Special Requests   Final    BOTTLES DRAWN AEROBIC AND ANAEROBIC Blood Culture results may not be optimal due to an inadequate volume of blood received in culture bottles   Culture   Final    NO GROWTH  5 DAYS Performed at [facility], [place] [address] [place], [place] [address]    Report Status 12/29/2018 FINAL  Final  Blood culture (routine x 2)     Status: None   Collection Time: 12/24/18 12:58 PM  Result Value Ref Range Status   Specimen Description BLOOD LEFT HAND  Final   Special Requests   Final    BOTTLES DRAWN AEROBIC AND ANAEROBIC Blood Culture adequate volume   Culture   Final    NO GROWTH 5 DAYS Performed at [facility], [place] [address] [place], [place] [address]    Report Status 12/29/2018 FINAL  Final  Urine Culture     Status: None   Collection Time: 12/25/18 11:57 AM  Result Value Ref Range Status   Specimen Description URINE, RANDOM  Final   Special Requests NONE  Final   Culture   Final    NO GROWTH Performed at [facility], [place] [address], [place], [place] [address]    Report Status 12/26/2018 FINAL  Final    FURTHER DISCHARGE INSTRUCTIONS:  Get Medicines reviewed and adjusted: Please  take all your medications with you for your next visit with your Primary MD  Laboratory/radiological data: Please request your Primary MD to go over all hospital tests and procedure/radiological results at the follow up, please ask your Primary MD to get all Hospital records sent to his/her office.  In some cases, they will be blood work, cultures and biopsy results pending at the time of your discharge. Please request that your primary care M.D. goes through all the records of your hospital data and follows up on these results.  Also Note the following: If you experience worsening of your admission symptoms, develop shortness of breath, life threatening emergency, suicidal or homicidal thoughts you must seek medical attention immediately by calling 911 or calling your MD immediately  if symptoms less severe.  You must read complete instructions/literature along with all the possible adverse reactions/side effects for all the Medicines you take and that have been prescribed to you. Take any new Medicines after you have completely understood and accpet all the possible adverse reactions/side effects.   Do not drive when taking Pain medications or sleeping medications (Benzodaizepines)  Do not take more than prescribed Pain, Sleep and Anxiety Medications. It is not advisable to combine anxiety,sleep and pain medications without talking with your primary care practitioner  Special Instructions: If you have smoked or chewed Tobacco  in the last 2 yrs please stop smoking, stop any regular Alcohol  and or any Recreational drug use.  Wear Seat belts while driving.  Please note: You were cared for by a hospitalist during your hospital stay. Once you are discharged, your primary care physician will handle any further medical issues. Please note that NO REFILLS for any discharge medications will be authorized once you are discharged, as it is imperative that you return to your primary care physician (or  establish a relationship with a primary care physician if you do not have one) for your post hospital discharge needs so that they can reassess your need for medications and monitor your lab values.  Total Time spent coordinating discharge including counseling, education and face to face time equals 35 minutes.  SignedOren Binet 12/31/2018 10:43 AM

## 2019-01-13 NOTE — Progress Notes (Signed)
[name] [address] [address], [place] [address] Phone: [phone] Subjective:   I Rachel Holmes am serving as a Education administrator for Dr. Hulan Saas.   CC: Right knee pain  BMW:[id number]    01/14/2019 Rachel Holmes is a 58 y.o. female coming in with complaint of right knee pain. Knee has been giving out causing her to depend on her left side.  Patient is being treated for a wound on the left leg.  Patient is in a wheelchair at the moment.  States that the knee on the right knee gives her more Trouble.  Having to put more weight when she is transferring.  Has noticed some mild increase in swelling.  Patient is a diabetic but takes insulin and blood sugars have been somewhat controlled.  Recently hospitalized and was found to have a endocarditis PICC line in place.  On antibiotics.      Past Medical History:  Diagnosis Date  . Allergic rhinitis   . Anemia   . Anxiety   . Back pain   . Bradycardia   . Breast mass    Patient can no longer palpate specific masses but showed tech general area of concern  . CHF (congestive heart failure) ([place])   . CKD (chronic kidney disease)    STAGE 3  . Constipation   . Diabetes mellitus without complication ([place])   . Dyspnea   . GERD (gastroesophageal reflux disease)   . HLD (hyperlipidemia)   . Hyperparathyroidism ([place])   . Hypertension   . Joint pain   . Leg edema   . Legally blind in left eye, as defined in [place]   . Lymphedema   . Onychomycosis    Past Surgical History:  Procedure Laterality Date  . ABDOMINAL HYSTERECTOMY    . BREAST BIOPSY Left 2014   FNA 12:00 position - Negative  . EYE SURGERY    . IR FLUORO GUIDE CV LINE RIGHT  12/29/2018  . IR US GUIDE VASC ACCESS RIGHT  12/29/2018  . TEE WITHOUT CARDIOVERSION N/A 12/28/2018   Procedure: TRANSESOPHAGEAL ECHOCARDIOGRAM (TEE);  Surgeon: Sueanne Margarita, MD;  Location: [facility] ENDOSCOPY;  Service: Cardiovascular;  Laterality: N/A;   Social History    Socioeconomic History  . Marital status: Divorced    Spouse name: Not on file  . Number of children: Not on file  . Years of education: Not on file  . Highest education level: Not on file  Occupational History  . Occupation: Glass blower/designer  Social Needs  . Financial resource strain: Not on file  . Food insecurity:    Worry: Not on file    Inability: Not on file  . Transportation needs:    Medical: Not on file    Non-medical: Not on file  Tobacco Use  . Smoking status: Never Smoker  . Smokeless tobacco: Never Used  Substance and Sexual Activity  . Alcohol use: No  . Drug use: Never  . Sexual activity: Not on file  Lifestyle  . Physical activity:    Days per week: Not on file    Minutes per session: Not on file  . Stress: Not on file  Relationships  . Social connections:    Talks on phone: Not on file    Gets together: Not on file    Attends religious service: Not on file    Active member of club or organization: Not on file    Attends meetings of clubs or organizations:  Not on file    Relationship status: Not on file  Other Topics Concern  . Not on file  Social History Narrative   ** Merged History Encounter **       Allergies  Allergen Reactions  . Statins Shortness Of Breath    Wheezing, short of breath  . Codeine Nausea Only  . Ibuprofen Other (See Comments)    Reaction:  Raises pts BP  . Penicillins Hives and Other (See Comments)    Has patient had a PCN reaction causing immediate rash, facial/tongue/throat swelling, SOB or lightheadedness with hypotension: No Has patient had a PCN reaction causing severe rash involving mucus membranes or skin necrosis: No Has patient had a PCN reaction that required hospitalization No Has patient had a PCN reaction occurring within the last 10 years: No If all of the above answers are "NO", then may proceed with Cephalosporin use.  [name] Percocet [Oxycodone-Acetaminophen] Nausea Only  . Tramadol Nausea Only    Can take if  she has eaten  . Vicodin [Hydrocodone-Acetaminophen] Nausea Only   Family History  Problem Relation Age of Onset  . Breast cancer Sister 43  . Diabetes Sister   . Diabetes Mother   . Hypertension Mother   . Hyperlipidemia Mother   . Eating disorder Mother   . Obesity Mother     Current Outpatient Medications (Endocrine & Metabolic):  .  dapagliflozin propanediol (FARXIGA) 5 MG TABS tablet, Take 5 mg by mouth daily. .  Dulaglutide (TRULICITY) 1.5 CB/7.6EG SOPN, Inject 1.5 mg into the skin once a week. (Patient taking differently: Inject 1.5 mg into the skin once a week. sunday) .  repaglinide (PRANDIN) 1 MG tablet, Take 1 tablet (1 mg total) by mouth 3 (three) times daily before meals.  Current Outpatient Medications (Cardiovascular):  .  amLODipine (NORVASC) 10 MG tablet, Take 10 mg by mouth daily. .  cloNIDine (CATAPRES) 0.3 MG tablet, Take 0.3 mg by mouth 2 (two) times daily. .  isosorbide mononitrate (IMDUR) 120 MG 24 hr tablet, Take 120 mg by mouth daily. [name]  torsemide (DEMADEX) 20 MG tablet, Take 2 tablets (40 mg total) by mouth daily.  Current Outpatient Medications (Respiratory):  .  albuterol (PROVENTIL HFA;VENTOLIN HFA) 108 (90 Base) MCG/ACT inhaler, Inhale 1-2 puffs into the lungs every 6 (six) hours as needed for wheezing or shortness of breath.  Current Outpatient Medications (Analgesics):  .  aspirin EC 325 MG tablet, Take 325 mg by mouth daily. .  traMADol (ULTRAM) 50 MG tablet, Take 50 mg by mouth every 6 (six) hours as needed for moderate pain or severe pain (Pain). Take with food   Current Outpatient Medications (Other):  .  Blood Glucose Monitoring Suppl (ONE TOUCH ULTRA 2) w/Device KIT, 1 Device by Does not apply route daily. [name]  ceFAZolin (ANCEF) IVPB, Inject 2 g into the vein every 12 (twelve) hours. Indication:  Endocarditis Last Day of Therapy:  02/11/19 Labs - Once weekly:  CBC/D and BMP Labs - Every other week:  ESR and CRP .  DULoxetine (CYMBALTA) 30 MG  capsule, Take 30 mg by mouth daily.  [name]  gabapentin (NEURONTIN) 600 MG tablet, Take 600 mg by mouth at bedtime. [name]  glucose blood (ONETOUCH VERIO) test strip, 1 each by Other route 2 (two) times daily. And lancets 1/day .  Lancets (ONETOUCH ULTRASOFT) lancets, Used to check blood sugars four times daily. .  metroNIDAZOLE (FLAGYL) 500 MG tablet, Take 1 tablet (500 mg total) by mouth every 8 (  eight) hours. .  Multiple Vitamin (MULTIVITAMIN WITH MINERALS) TABS tablet, Take 1 tablet by mouth daily. [name]  omeprazole (PRILOSEC) 40 MG capsule, Take 40 mg by mouth 2 (two) times daily. .  sodium bicarbonate 650 MG tablet, Take 1 tablet (650 mg total) by mouth 2 (two) times daily. .  Vitamin D, Ergocalciferol, (DRISDOL) 1.25 MG (50000 UT) CAPS capsule, Take 1 capsule (50,000 Units total) by mouth every 7 (seven) days.    Past medical history, social, surgical and family history all reviewed in electronic medical record.  No pertanent information unless stated regarding to the chief complaint.   Review of Systems:  No headache, visual changes, nausea, vomiting, diarrhea, constipation, dizziness, abdominal pain, skin rash, fevers, chills, night sweats, weight loss, swollen lymph nodes, body aches, joint swelling, muscle aches, chest pain, shortness of breath, mood changes.   Objective  Blood pressure 110/80, pulse 73, height _0  (1.676 m), weight 296 lb (134.3 kg), SpO2 95 %.  General: No apparent distress alert and oriented x3 mood and affect normal, dressed appropriately.   MSK: Right knee Knee: valgus deformity noted. Large thigh to calf ratio.  Tender to palpation over medial and PF joint line.  ROM full in flexion and extension and lower leg rotation. instability with valgus force.  painful patellar compression. Patellar glide with moderate crepitus. Patellar and quadriceps tendons unremarkable. Hamstring and quadriceps strength is normal.  After informed written and verbal consent, patient was  seated on exam table. Right knee was prepped with alcohol swab and utilizing anterolateral approach, patient's right knee space was injected with 4:1  marcaine 0.5%: Kenalog 55m/dL. Patient tolerated the procedure well without immediate complications.   Impression and Recommendations:    . The above documentation has been reviewed and is accurate and complete ZLyndal Pulley DO       Note: This dictation was prepared with Dragon dictation along with smaller phrase technology. Any transcriptional errors that result from this process are unintentional.

## 2019-01-14 ENCOUNTER — Ambulatory Visit (INDEPENDENT_AMBULATORY_CARE_PROVIDER_SITE_OTHER): Payer: Medicare HMO | Admitting: Family Medicine

## 2019-01-14 ENCOUNTER — Other Ambulatory Visit: Payer: Self-pay

## 2019-01-14 ENCOUNTER — Encounter: Payer: Self-pay | Admitting: Family Medicine

## 2019-01-14 DIAGNOSIS — M1711 Unilateral primary osteoarthritis, right knee: Secondary | ICD-10-CM

## 2019-01-14 NOTE — Patient Instructions (Signed)
Good to see you  Ice is your friend You know the drill  See me again in 10 weeks!

## 2019-01-14 NOTE — Assessment & Plan Note (Signed)
Discussed with patient in great length about icing regimen and home exercise, which activities to do which wants to avoid.  We discussed with patient's knee symptoms are illnesses and we will do the injection the patient needs to monitor the blood sugars.

## 2019-01-18 ENCOUNTER — Inpatient Hospital Stay: Payer: Medicare HMO | Admitting: Infectious Diseases

## 2019-01-20 ENCOUNTER — Ambulatory Visit: Payer: Medicare HMO | Admitting: Family

## 2019-01-20 ENCOUNTER — Other Ambulatory Visit: Payer: Self-pay

## 2019-01-20 ENCOUNTER — Encounter: Payer: Self-pay | Admitting: Family

## 2019-01-20 ENCOUNTER — Inpatient Hospital Stay: Payer: Medicare HMO | Admitting: Family

## 2019-01-20 VITALS — BP 160/78 | HR 66 | Temp 98.1°F

## 2019-01-20 DIAGNOSIS — A491 Streptococcal infection, unspecified site: Secondary | ICD-10-CM

## 2019-01-20 DIAGNOSIS — I358 Other nonrheumatic aortic valve disorders: Secondary | ICD-10-CM

## 2019-01-20 NOTE — Assessment & Plan Note (Signed)
Rachel Holmes continues to receive treatment for group B streptococcus bacteremia with likely source being her open wound of the left lower leg.  She reports the wound is now closed and continues to be seen by wound care.  Continue current dose of cefazolin.  Discussed importance of controlling her blood sugars/diabetes to reduce risk of progression of vascular disease as well as reduce risk of complicated healing.  Continue to monitor.

## 2019-01-20 NOTE — Patient Instructions (Signed)
Nice to see you!  We will continue your Ancef through 02/11/19 as planned.  Plan for follow up with Janene Madeira in 3 weeks at the end of treatment.

## 2019-01-20 NOTE — Assessment & Plan Note (Signed)
Rachel Holmes continues to be treated for group G streptococcus aortic valve endocarditis with cefazolin.  She is approximately halfway through her treatment and doing well with the medications.  Inflammatory markers appear improved.  Continue current dose of cefazolin with end date of 02/11/2019 with plan to repeat TTE and possible referral to cardiovascular thoracic surgery for further evaluation and treatment as indicated.  Follow-up in 3 weeks or sooner if needed.

## 2019-01-20 NOTE — Progress Notes (Signed)
Subjective:    Patient ID: Rachel Holmes, female    DOB: 25-Apr-1961, 58 y.o.   MRN: [id number]  Chief Complaint  Patient presents with  . Endocarditis  . Bacteremia    HPI:  Rachel Holmes is a 58 y.o. female who presents today for initial office visit follow hospitalization for endocarditis and bacteremia.   [name] was recently admitted to [facility] with a wound which started out as a blister approximately 2 to 3 months prior to presentation on her left lower ankle.  Previously being treated by wound care as well.  Prior to admission developed fevers, myalgias, body aches, and headache with lethargy.  Blood cultures were found to be positive for group G streptococcus.  Initially started on broad-spectrum vancomycin and cefepime and narrowed to ceftriaxone.  MRI of the ankle with no evidence of osteomyelitis with concern for pyomyositis.  Repeat blood cultures were negative.  Transesophageal echocardiogram consistent with aortic valve vegetation with a likely source of infection being the ulcer on her left leg.  A long-term vascular access line was placed as her creatinine clearance was low secondary to CKD stage IV.  Cardiovascular thoracic surgery recommended treatment with antibiotics and follow-up after completion of therapy.  She was placed on 6 weeks of cefazolin with end date of 02/11/2019.  All hospital records, labs, and imaging reviewed in detail.  [name] has been receiving her cefazolin with no adverse side effects or missed doses.  Her central line dressing is clean and dry and without evidence of infection.  Denies fevers, chills, or sweats.  Wound on lower left leg has closed per patient.  She continues to be seen by wound care.   Allergies  Allergen Reactions  . Statins Shortness Of Breath    Wheezing, short of breath  . Codeine Nausea Only  . Ibuprofen Other (See Comments)    Reaction:  Raises pts BP  . Penicillins Hives and Other (See Comments)    Has patient had a PCN reaction causing immediate rash, facial/tongue/throat swelling, SOB or lightheadedness with hypotension: No Has patient had a PCN reaction causing severe rash involving mucus membranes or skin necrosis: No Has patient had a PCN reaction that required hospitalization No Has patient had a PCN reaction occurring within the last 10 years: No If all of the above answers are "NO", then may proceed with Cephalosporin use.  [name] Percocet [Oxycodone-Acetaminophen] Nausea Only  . Tramadol Nausea Only    Can take if she has eaten  . Vicodin [Hydrocodone-Acetaminophen] Nausea Only      Outpatient Medications Prior to Visit  Medication Sig Dispense Refill  . albuterol (PROVENTIL HFA;VENTOLIN HFA) 108 (90 Base) MCG/ACT inhaler Inhale 1-2 puffs into the lungs every 6 (six) hours as needed for wheezing or shortness of breath.    [name] amLODipine (NORVASC) 10 MG tablet Take 10 mg by mouth daily.    [name] aspirin EC 325 MG tablet Take 325 mg by mouth daily.    . Blood Glucose Monitoring Suppl (ONE TOUCH ULTRA 2) w/Device KIT 1 Device by Does not apply route daily. 1 each 0  . ceFAZolin (ANCEF) IVPB Inject 2 g into the vein every 12 (twelve) hours. Indication:  Endocarditis Last Day of Therapy:  02/11/19 Labs - Once weekly:  CBC/D and BMP Labs - Every other week:  ESR and CRP 84 Units 0  . cloNIDine (CATAPRES) 0.3 MG tablet Take 0.3 mg by mouth 2 (two) times daily.    [name]  dapagliflozin propanediol (FARXIGA) 5 MG TABS tablet Take 5 mg by mouth daily. 30 tablet 11  . Dulaglutide (TRULICITY) 1.5 TF/5.7DU SOPN Inject 1.5 mg into the skin once a week. (Patient taking differently: Inject 1.5 mg into the skin once a week. sunday) 4 pen 11  . DULoxetine (CYMBALTA) 30 MG capsule Take 30 mg by mouth daily.     [name] gabapentin (NEURONTIN) 600 MG tablet Take 600 mg by mouth at bedtime.    [name] glucose blood (ONETOUCH VERIO) test strip 1 each by Other route 2 (two) times daily. And lancets 1/day 100 each 5  .  isosorbide mononitrate (IMDUR) 120 MG 24 hr tablet Take 120 mg by mouth daily.    . Lancets (ONETOUCH ULTRASOFT) lancets Used to check blood sugars four times daily. 200 each 12  . metroNIDAZOLE (FLAGYL) 500 MG tablet Take 1 tablet (500 mg total) by mouth every 8 (eight) hours. 8 tablet 0  . Multiple Vitamin (MULTIVITAMIN WITH MINERALS) TABS tablet Take 1 tablet by mouth daily.    [name] omeprazole (PRILOSEC) 40 MG capsule Take 40 mg by mouth 2 (two) times daily.    . repaglinide (PRANDIN) 1 MG tablet Take 1 tablet (1 mg total) by mouth 3 (three) times daily before meals. 90 tablet 11  . sodium bicarbonate 650 MG tablet Take 1 tablet (650 mg total) by mouth 2 (two) times daily. 60 tablet 0  . torsemide (DEMADEX) 20 MG tablet Take 2 tablets (40 mg total) by mouth daily. 60 tablet 0  . traMADol (ULTRAM) 50 MG tablet Take 50 mg by mouth every 6 (six) hours as needed for moderate pain or severe pain (Pain). Take with food    . Vitamin D, Ergocalciferol, (DRISDOL) 1.25 MG (50000 UT) CAPS capsule Take 1 capsule (50,000 Units total) by mouth every 7 (seven) days. 4 capsule 0   No facility-administered medications prior to visit.      Past Medical History:  Diagnosis Date  . Allergic rhinitis   . Anemia   . Anxiety   . Back pain   . Bradycardia   . Breast mass    Patient can no longer palpate specific masses but showed tech general area of concern  . CHF (congestive heart failure) ([place])   . CKD (chronic kidney disease)    STAGE 3  . Constipation   . Diabetes mellitus without complication ([place])   . Dyspnea   . GERD (gastroesophageal reflux disease)   . HLD (hyperlipidemia)   . Hyperparathyroidism ([place])   . Hypertension   . Joint pain   . Leg edema   . Legally blind in left eye, as defined in [place]   . Lymphedema   . Onychomycosis       Past Surgical History:  Procedure Laterality Date  . ABDOMINAL HYSTERECTOMY    . BREAST BIOPSY Left 2014   FNA 12:00 position - Negative  . EYE SURGERY     . IR FLUORO GUIDE CV LINE RIGHT  12/29/2018  . IR US GUIDE VASC ACCESS RIGHT  12/29/2018  . TEE WITHOUT CARDIOVERSION N/A 12/28/2018   Procedure: TRANSESOPHAGEAL ECHOCARDIOGRAM (TEE);  Surgeon: Sueanne Margarita, MD;  [facility]: [facility] ENDOSCOPY;  Service: Cardiovascular;  Laterality: N/A;      Family History  Problem Relation Age of Onset  . Breast cancer Sister 26  . Diabetes Sister   . Diabetes Mother   . Hypertension Mother   . Hyperlipidemia Mother   . Eating disorder Mother   .  Obesity Mother       Social History   Socioeconomic History  . Marital status: Divorced    Spouse name: Not on file  . Number of children: Not on file  . Years of education: Not on file  . Highest education level: Not on file  Occupational History  . Occupation: Glass blower/designer  Social Needs  . Financial resource strain: Not on file  . Food insecurity:    Worry: Not on file    Inability: Not on file  . Transportation needs:    Medical: Not on file    Non-medical: Not on file  Tobacco Use  . Smoking status: Never Smoker  . Smokeless tobacco: Never Used  Substance and Sexual Activity  . Alcohol use: No  . Drug use: Never  . Sexual activity: Not on file  Lifestyle  . Physical activity:    Days per week: Not on file    Minutes per session: Not on file  . Stress: Not on file  Relationships  . Social connections:    Talks on phone: Not on file    Gets together: Not on file    Attends religious service: Not on file    Active member of club or organization: Not on file    Attends meetings of clubs or organizations: Not on file    Relationship status: Not on file  . Intimate partner violence:    Fear of current or ex partner: Not on file    Emotionally abused: Not on file    Physically abused: Not on file    Forced sexual activity: Not on file  Other Topics Concern  . Not on file  Social History Narrative   ** Merged History Encounter **          Review of Systems   Constitutional: Negative for chills, fatigue and fever.  Eyes:       Denies changes in vision  Respiratory: Negative for chest tightness and shortness of breath.   Cardiovascular: Negative for chest pain, palpitations and leg swelling.  Endocrine: Negative for polydipsia, polyphagia and polyuria.  Neurological: Negative for numbness.       Objective:    BP (!) 160/78   Pulse 66   Temp 98.1 F (36.7 C)  Nursing note and vital signs reviewed.  Physical Exam Constitutional:      General: She is not in acute distress.    Appearance: She is well-developed.     Comments: Seated in the wheelchair; pleasant.  Cardiovascular:     Rate and Rhythm: Normal rate and regular rhythm.     Heart sounds: Murmur present.     Comments: Central line on right chest with dressing that is clean and dry and site appears free of infection. Pulmonary:     Effort: Pulmonary effort is normal.     Breath sounds: Normal breath sounds.  Musculoskeletal:     Comments: Left foot in a cast.  Skin:    General: Skin is warm and dry.  Neurological:     Mental Status: She is alert and oriented to person, place, and time.         Assessment & Plan:   Problem List Items Addressed This Visit      Cardiovascular and Mediastinum   Aortic valve endocarditis    Ms. Rocchio continues to be treated for group G streptococcus aortic valve endocarditis with cefazolin.  She is approximately halfway through her treatment and doing well with the medications.  Inflammatory markers appear improved.  Continue current dose of cefazolin with end date of 02/11/2019 with plan to repeat TTE and possible referral to cardiovascular thoracic surgery for further evaluation and treatment as indicated.  Follow-up in 3 weeks or sooner if needed.        Other   Streptococcus infection - Primary    Ms. Milius continues to receive treatment for group B streptococcus bacteremia with likely source being her open wound of the left  lower leg.  She reports the wound is now closed and continues to be seen by wound care.  Continue current dose of cefazolin.  Discussed importance of controlling her blood sugars/diabetes to reduce risk of progression of vascular disease as well as reduce risk of complicated healing.  Continue to monitor.          I am having Rachel Holmes maintain her amLODipine, aspirin EC, cloNIDine, gabapentin, multivitamin with minerals, isosorbide mononitrate, omeprazole, albuterol, ONE TOUCH ULTRA 2, onetouch ultrasoft, Dulaglutide, DULoxetine, Vitamin D (Ergocalciferol), dapagliflozin propanediol, glucose blood, repaglinide, traMADol, ceFAZolin, metroNIDAZOLE, sodium bicarbonate, and torsemide.   Follow-up: Return in about 3 weeks (around 02/10/2019), or if symptoms worsen or fail to improve.    Terri Piedra, MSN, FNP-C Nurse Practitioner [facility] Office phone: [phone] Pager: [place] number: [phone]

## 2019-01-21 DIAGNOSIS — E1142 Type 2 diabetes mellitus with diabetic polyneuropathy: Secondary | ICD-10-CM | POA: Insufficient documentation

## 2019-01-21 DIAGNOSIS — M21962 Unspecified acquired deformity of left lower leg: Secondary | ICD-10-CM | POA: Insufficient documentation

## 2019-01-22 LAB — HEMOGLOBIN A1C: Hemoglobin A1C: 8.4

## 2019-01-26 ENCOUNTER — Other Ambulatory Visit: Payer: Self-pay

## 2019-01-27 ENCOUNTER — Encounter: Payer: Self-pay | Admitting: Endocrinology

## 2019-01-27 ENCOUNTER — Ambulatory Visit (INDEPENDENT_AMBULATORY_CARE_PROVIDER_SITE_OTHER): Payer: Medicare HMO | Admitting: Endocrinology

## 2019-01-27 ENCOUNTER — Other Ambulatory Visit: Payer: Self-pay

## 2019-01-27 VITALS — BP 122/78 | HR 66 | Temp 97.6°F

## 2019-01-27 DIAGNOSIS — E1165 Type 2 diabetes mellitus with hyperglycemia: Secondary | ICD-10-CM

## 2019-01-27 DIAGNOSIS — E1142 Type 2 diabetes mellitus with diabetic polyneuropathy: Secondary | ICD-10-CM

## 2019-01-27 DIAGNOSIS — IMO0002 Reserved for concepts with insufficient information to code with codable children: Secondary | ICD-10-CM

## 2019-01-27 MED ORDER — INSULIN ASPART 100 UNIT/ML FLEXPEN: 12.0000 [IU] | PEN_INJECTOR | Freq: Three times a day (TID) | SUBCUTANEOUS | 11 refills | Status: DC

## 2019-01-27 NOTE — Patient Instructions (Addendum)
I have sent a prescription to your pharmacy, to change the repaglinide back to Novolog, 12 units 3 times a day (just before each meal), and:  Please continue the same Trulicity.   check your blood sugar twice a day.  vary the time of day when you check, between before the 3 meals, and at bedtime.  also check if you have symptoms of your blood sugar being too high or too low.  please keep a record of the readings and bring it to your next appointment here (or you can bring the meter itself).  You can write it on any piece of paper.  please call us sooner if your blood sugar goes below 70, or if you have a lot of readings over 200.  Please call or message [name] next week, to tell us how the blood sugar is doing.   Please come back for a follow-up appointment in 3 weeks.

## 2019-01-27 NOTE — Progress Notes (Signed)
Subjective:    Patient ID: Rachel Holmes, female    DOB: 1961/01/23, 58 y.o.   MRN: [id number]  HPI Pt returns for f/u of diabetes mellitus: DM type: Insulin-requiring type 2 Dx'ed: 1988, during a pregnancy, but it persisted after Complications: polyneuropathy, renal failure, leg ulcer, Charcot foot, and PDR.   Therapy:  trulicity and 2 oral meds DKA: never Severe hypoglycemia: 2 episodes (both many years ago).   Pancreatitis: never Pancreatic imaging: normal on 2008 [name].   Other: she took insulin 2008-2020; renal failure limits rx options;  Interval history: nephrol d/c'ed farxiga and aldactone.  She was started on iv-abx, for ankle ulcer and endocarditis.  She had a steroid injection into the right knee, 2 weeks ago.  She still takes trulicity and repaglinide.  no cbg record, but states cbg's vary from 96-300.  There is no trend throughout the day.  Past Medical History:  Diagnosis Date  . Allergic rhinitis   . Anemia   . Anxiety   . Back pain   . Bradycardia   . Breast mass    Patient can no longer palpate specific masses but showed tech general area of concern  . CHF (congestive heart failure) ([place])   . CKD (chronic kidney disease)    STAGE 3  . Constipation   . Diabetes mellitus without complication ([place])   . Dyspnea   . GERD (gastroesophageal reflux disease)   . HLD (hyperlipidemia)   . Hyperparathyroidism ([place])   . Hypertension   . Joint pain   . Leg edema   . Legally blind in left eye, as defined in [place]   . Lymphedema   . Onychomycosis     Past Surgical History:  Procedure Laterality Date  . ABDOMINAL HYSTERECTOMY    . BREAST BIOPSY Left 2014   FNA 12:00 position - Negative  . EYE SURGERY    . IR FLUORO GUIDE CV LINE RIGHT  12/29/2018  . IR US GUIDE VASC ACCESS RIGHT  12/29/2018  . TEE WITHOUT CARDIOVERSION N/A 12/28/2018   Procedure: TRANSESOPHAGEAL ECHOCARDIOGRAM (TEE);  Surgeon: Sueanne Margarita, MD;  Location: [facility] ENDOSCOPY;  Service: Cardiovascular;   Laterality: N/A;    Social History   Socioeconomic History  . Marital status: Divorced    Spouse name: Not on file  . Number of children: Not on file  . Years of education: Not on file  . Highest education level: Not on file  Occupational History  . Occupation: Glass blower/designer  Social Needs  . Financial resource strain: Not on file  . Food insecurity:    Worry: Not on file    Inability: Not on file  . Transportation needs:    Medical: Not on file    Non-medical: Not on file  Tobacco Use  . Smoking status: Never Smoker  . Smokeless tobacco: Never Used  Substance and Sexual Activity  . Alcohol use: No  . Drug use: Never  . Sexual activity: Not on file  Lifestyle  . Physical activity:    Days per week: Not on file    Minutes per session: Not on file  . Stress: Not on file  Relationships  . Social connections:    Talks on phone: Not on file    Gets together: Not on file    Attends religious service: Not on file    Active member of club or organization: Not on file    Attends meetings of clubs or organizations: Not on file  Relationship status: Not on file  . Intimate partner violence:    Fear of current or ex partner: Not on file    Emotionally abused: Not on file    Physically abused: Not on file    Forced sexual activity: Not on file  Other Topics Concern  . Not on file  Social History Narrative   ** Merged History Encounter **        Current Outpatient Medications on File Prior to Visit  Medication Sig Dispense Refill  . albuterol (PROVENTIL HFA;VENTOLIN HFA) 108 (90 Base) MCG/ACT inhaler Inhale 1-2 puffs into the lungs every 6 (six) hours as needed for wheezing or shortness of breath.    [name] amLODipine (NORVASC) 10 MG tablet Take 10 mg by mouth daily.    [name] aspirin EC 325 MG tablet Take 325 mg by mouth daily.    . Blood Glucose Monitoring Suppl (ONE TOUCH ULTRA 2) w/Device KIT 1 Device by Does not apply route daily. 1 each 0  . ceFAZolin (ANCEF) IVPB Inject  2 g into the vein every 12 (twelve) hours. Indication:  Endocarditis Last Day of Therapy:  02/11/19 Labs - Once weekly:  CBC/D and BMP Labs - Every other week:  ESR and CRP 84 Units 0  . cloNIDine (CATAPRES) 0.3 MG tablet Take 0.3 mg by mouth 2 (two) times daily.    . Dulaglutide (TRULICITY) 1.5 LK/5.6YB SOPN Inject 1.5 mg into the skin once a week. (Patient taking differently: Inject 1.5 mg into the skin once a week. sunday) 4 pen 11  . DULoxetine (CYMBALTA) 30 MG capsule Take 30 mg by mouth daily.     [name] gabapentin (NEURONTIN) 600 MG tablet Take 600 mg by mouth at bedtime.    [name] glucose blood (ONETOUCH VERIO) test strip 1 each by Other route 2 (two) times daily. And lancets 1/day 100 each 5  . isosorbide mononitrate (IMDUR) 120 MG 24 hr tablet Take 120 mg by mouth daily.    . Lancets (ONETOUCH ULTRASOFT) lancets Used to check blood sugars four times daily. 200 each 12  . Multiple Vitamin (MULTIVITAMIN WITH MINERALS) TABS tablet Take 1 tablet by mouth daily.    [name] omeprazole (PRILOSEC) 40 MG capsule Take 40 mg by mouth 2 (two) times daily.    . sodium bicarbonate 650 MG tablet Take 1 tablet (650 mg total) by mouth 2 (two) times daily. 60 tablet 0  . torsemide (DEMADEX) 20 MG tablet Take 2 tablets (40 mg total) by mouth daily. 60 tablet 0  . traMADol (ULTRAM) 50 MG tablet Take 50 mg by mouth every 6 (six) hours as needed for moderate pain or severe pain (Pain). Take with food    . Vitamin D, Ergocalciferol, (DRISDOL) 1.25 MG (50000 UT) CAPS capsule Take 1 capsule (50,000 Units total) by mouth every 7 (seven) days. 4 capsule 0   No current facility-administered medications on file prior to visit.     Allergies  Allergen Reactions  . Statins Shortness Of Breath    Wheezing, short of breath  . Codeine Nausea Only  . Ibuprofen Other (See Comments)    Reaction:  Raises pts BP  . Penicillins Hives and Other (See Comments)    Has patient had a PCN reaction causing immediate rash,  facial/tongue/throat swelling, SOB or lightheadedness with hypotension: No Has patient had a PCN reaction causing severe rash involving mucus membranes or skin necrosis: No Has patient had a PCN reaction that required hospitalization No Has patient had a PCN  reaction occurring within the last 10 years: No If all of the above answers are "NO", then may proceed with Cephalosporin use.  [name] Percocet [Oxycodone-Acetaminophen] Nausea Only  . Tramadol Nausea Only    Can take if she has eaten  . Vicodin [Hydrocodone-Acetaminophen] Nausea Only    Family History  Problem Relation Age of Onset  . Breast cancer Sister 62  . Diabetes Sister   . Diabetes Mother   . Hypertension Mother   . Hyperlipidemia Mother   . Eating disorder Mother   . Obesity Mother     BP 122/78   Pulse 66   Temp 97.6 F (36.4 C)   SpO2 98%   Review of Systems She denies hypoglycemia.      Objective:   Physical Exam VITAL SIGNS:  See vs page GENERAL: no distress.  In wheelchair Pulses: right dorsalis pedis is intact  MSK: no deformity of the right foot CV: 2+ right leg edema Skin:  no ulcer on the feet.  normal color and temp on the right foot. Neuro: sensation is intact to touch on the right foot.   Ext: left foot is bandaged.     Lab Results  Component Value Date   CREATININE 2.69 (H) 12/31/2018   BUN 38 (H) 12/31/2018   NA 135 12/31/2018   K 3.6 12/31/2018   CL 95 (L) 12/31/2018   CO2 27 12/31/2018   Lab Results  Component Value Date   HGBA1C 8.4 01/22/2019       Assessment & Plan:  Insulin-requiring type 2 DM, with PDR: worse Endocarditis: he may be manageable off insulin, but the first priority is to clear infection Renal failure: in this context, he prob does not need basal insulin Knee pain: steroids are affecting glycemic control  Patient Instructions  I have sent a prescription to your pharmacy, to change the repaglinide back to Novolog, 12 units 3 times a day (just before each meal),  and:  Please continue the same Trulicity.   check your blood sugar twice a day.  vary the time of day when you check, between before the 3 meals, and at bedtime.  also check if you have symptoms of your blood sugar being too high or too low.  please keep a record of the readings and bring it to your next appointment here (or you can bring the meter itself).  You can write it on any piece of paper.  please call us sooner if your blood sugar goes below 70, or if you have a lot of readings over 200.  Please call or message [name] next week, to tell us how the blood sugar is doing.   Please come back for a follow-up appointment in 3 weeks.

## 2019-01-29 ENCOUNTER — Telehealth: Payer: Self-pay | Admitting: *Deleted

## 2019-01-29 NOTE — Telephone Encounter (Signed)
Marya Amsler notified Jeani Hawking at [facility] of the change.

## 2019-01-29 NOTE — Telephone Encounter (Signed)
Rachel Holmes from [facility] called to report that the patient labs from 01/18/19 were elevated  01-18-19 Labs Creat 3.41  BUN 56   Patient is taking Cefazolin 2 grams q12h and her end date is 02/11/19.  Advised will let the provider know and we will give a call back once he responds.

## 2019-01-29 NOTE — Telephone Encounter (Signed)
After consulting with ID pharmacy team recommend decreasing dose of Ancef to 1 gram q 12 to accommodate for change in renal function. Thanks.

## 2019-02-01 ENCOUNTER — Encounter: Payer: Self-pay | Admitting: Infectious Diseases

## 2019-02-08 ENCOUNTER — Encounter: Payer: Self-pay | Admitting: Infectious Diseases

## 2019-02-09 ENCOUNTER — Telehealth: Payer: Self-pay | Admitting: Infectious Diseases

## 2019-02-09 NOTE — Telephone Encounter (Signed)
COVID-19 Pre-Screening Questions: ° °Do you currently have a fever (>100 °F), chills or unexplained body aches? No  ° °Are you currently experiencing new cough, shortness of breath, sore throat, runny nose? No  °•  °Have you recently travelled outside the state of [place] in the last 14 days? No  °•  °Have you been in contact with someone that is currently pending confirmation of Covid19 testing or has been confirmed to have the Covid19 virus?  No  °

## 2019-02-10 ENCOUNTER — Encounter: Payer: Self-pay | Admitting: Infectious Diseases

## 2019-02-10 ENCOUNTER — Ambulatory Visit: Payer: Medicare HMO | Admitting: Infectious Diseases

## 2019-02-10 VITALS — BP 132/83 | HR 80 | Temp 98.0°F

## 2019-02-10 DIAGNOSIS — A491 Streptococcal infection, unspecified site: Secondary | ICD-10-CM | POA: Diagnosis not present

## 2019-02-10 DIAGNOSIS — Z95828 Presence of other vascular implants and grafts: Secondary | ICD-10-CM

## 2019-02-10 DIAGNOSIS — R7881 Bacteremia: Secondary | ICD-10-CM

## 2019-02-10 DIAGNOSIS — M14672 Charcot's joint, left ankle and foot: Secondary | ICD-10-CM

## 2019-02-10 DIAGNOSIS — N184 Chronic kidney disease, stage 4 (severe): Secondary | ICD-10-CM | POA: Diagnosis not present

## 2019-02-10 DIAGNOSIS — B955 Unspecified streptococcus as the cause of diseases classified elsewhere: Secondary | ICD-10-CM

## 2019-02-10 DIAGNOSIS — I358 Other nonrheumatic aortic valve disorders: Secondary | ICD-10-CM | POA: Diagnosis not present

## 2019-02-10 NOTE — Progress Notes (Signed)
LPN called IR and spoke with Anderson Malta to schedule tunneled picc removal. Anderson Malta states they are holding off elective picc removal due to covid-19 pandemic. She provided a number for Dr. Jarvis Newcomer at [phone] so S. Dixon, NP could speak to him.  Eugenia Mcalpine, LPN

## 2019-02-10 NOTE — Assessment & Plan Note (Signed)
We will discuss with IR about the possibility of removing her vascular access device.  I am hopeful that we can get this out as soon as possible considering we have completed her treatment.  With her advanced kidney disease, recurrent cellulitis and ulcers, endocarditis I would really prefer to get this line out of her as soon as possible.

## 2019-02-10 NOTE — Assessment & Plan Note (Signed)
She does not have any signs of recurrent or relapsing bacteremia presently.

## 2019-02-10 NOTE — Patient Instructions (Signed)
Your foot looks excellent.  We will complete your antibiotics tomorrow on the ninth and arrange PICC line removal with the radiology team.  We will be in touch with this appointment.  Have placed referral for you to follow-up with cardiology and nephrology.  After we stop antibiotics and like to arrange a telephone visit in about 3 weeks so we can make certain that you continue to feel well.

## 2019-02-10 NOTE — Progress Notes (Signed)
Subjective:    Patient ID: Rachel Holmes, female    DOB: 08/26/1961, 58 y.o.   MRN: [id number]  Patient Active Problem List   Diagnosis Date Noted  . S/P PICC central line placement 02/10/2019  . Aortic valve endocarditis   . Bacteremia due to Streptococcus 12/24/2018  . Malignant hypertension (arteriolar nephrosclerosis), stage 1-4 or unspecified chronic kidney disease 07/15/2018  . CKD (chronic kidney disease) stage 4, GFR 15-29 ml/min (HCC) 07/15/2018  . Morbid obesity with BMI of 45.0-49.9, adult ([place]) 07/15/2018  . Excessive daytime sleepiness 07/15/2018  . Other fatigue 03/11/2018  . Shortness of breath on exertion 03/11/2018  . Essential hypertension 03/11/2018  . Vitamin D deficiency 03/11/2018  . Congestive heart failure ([place]) 03/11/2018  . Degenerative arthritis of right knee 01/06/2018  . Uncontrolled type 2 diabetes mellitus with polyneuropathy ([place]) 10/03/2017  . Knee pain 10/02/2017  . Obesity 10/02/2017  . Charcot ankle, left 08/29/2017  . Lymphedema 09/06/2016  . Hyponatremia with extracellular fluid depletion 02/02/2016  . Nausea with vomiting, unspecified 02/02/2016  . Closed nondisplaced fracture of right patella 08/23/2015  . Acute cystitis without hematuria 08/14/2014  . Colitis 08/14/2014  . HTN (hypertension), malignant 08/14/2014  . Acute renal failure superimposed on stage 3 chronic kidney disease ([place]) 06/26/2014  . Hyperparathyroidism, unspecified ([place]) 02/24/2014  . Allergic rhinitis 02/22/2014  . Onychomycosis 02/22/2014  . Anxiety 11/05/2013  . Bradycardia 05/14/2013  . Gastro-esophageal reflux disease without esophagitis 04/05/2013  . Anemia in other chronic diseases classified elsewhere 01/27/2013   CC:  Follow-up Streptococcus endocarditis.  No concerns today.  Reports that her left ankle ulcer has healed over.  She is ready to get her PICC line out.   HPI:  Rachel Holmes is a 58 y.o. female with multiple medical problems.   Rachel Holmes was admitted to [facility] 2 months ago and found to be bacteremic with group G Streptococcus.  MRI of the ankle revealed no evidence of osteomyelitis however there was concern for pyomyositis.  TEE was obtained also with mention of aortic valve vegetation.  She has had chronic left lower extremity ulceration present which is likely the source of her original infection.  She has had well-established CKD 3, however prior to discharge from the hospital her creatinine baseline has elevated to > 3.  She had a tunneled vascular access line placed to right chest discharged on 6 weeks of cefazolin with an end date February 11, 2019  She has not missed any doses of her antibiotic.  She tells me that her ankle wound has healed up completely.  She is very excited to see what it looks like today.  Her swelling is markedly improved.  She tells me that her kidneys are still causing trouble and asked me if I had the urine results from Dr. Iona Beard.  She has had no trouble with her antibiotics and no side effects.  She denies any chest pain, palpitations, orthopnea.  PICC line is without pain, drainage or erythema and is well maintained by [facility] Team. No swelling or altered sensation in affected distal extremity.   ROS 12 point review of systems reviewed and otherwise negative   Allergies  Allergen Reactions  . Statins Shortness Of Breath    Wheezing, short of breath  . Codeine Nausea Only  . Ibuprofen Other (See Comments)    Reaction:  Raises pts BP  . Penicillins Hives and Other (See Comments)    Has patient had a  PCN reaction causing immediate rash, facial/tongue/throat swelling, SOB or lightheadedness with hypotension: No Has patient had a PCN reaction causing severe rash involving mucus membranes or skin necrosis: No Has patient had a PCN reaction that required hospitalization No Has patient had a PCN reaction occurring within the last 10 years: No If all of the above answers are  "NO", then may proceed with Cephalosporin use.  [name] Percocet [Oxycodone-Acetaminophen] Nausea Only  . Tramadol Nausea Only    Can take if she has eaten  . Vicodin [Hydrocodone-Acetaminophen] Nausea Only      Outpatient Medications Prior to Visit  Medication Sig Dispense Refill  . albuterol (PROVENTIL HFA;VENTOLIN HFA) 108 (90 Base) MCG/ACT inhaler Inhale 1-2 puffs into the lungs every 6 (six) hours as needed for wheezing or shortness of breath.    [name] amLODipine (NORVASC) 10 MG tablet Take 10 mg by mouth daily.    [name] aspirin EC 325 MG tablet Take 325 mg by mouth daily.    . Blood Glucose Monitoring Suppl (ONE TOUCH ULTRA 2) w/Device KIT 1 Device by Does not apply route daily. 1 each 0  . ceFAZolin (ANCEF) IVPB Inject 2 g into the vein every 12 (twelve) hours. Indication:  Endocarditis Last Day of Therapy:  02/11/19 Labs - Once weekly:  CBC/D and BMP Labs - Every other week:  ESR and CRP 84 Units 0  . cloNIDine (CATAPRES) 0.3 MG tablet Take 0.3 mg by mouth 2 (two) times daily.    . Dulaglutide (TRULICITY) 1.5 JA/2.5KN SOPN Inject 1.5 mg into the skin once a week. (Patient taking differently: Inject 1.5 mg into the skin once a week. sunday) 4 pen 11  . DULoxetine (CYMBALTA) 30 MG capsule Take 30 mg by mouth daily.     [name] gabapentin (NEURONTIN) 600 MG tablet Take 600 mg by mouth at bedtime.    [name] glucose blood (ONETOUCH VERIO) test strip 1 each by Other route 2 (two) times daily. And lancets 1/day 100 each 5  . insulin aspart (NOVOLOG FLEXPEN) 100 UNIT/ML FlexPen Inject 12 Units into the skin 3 (three) times daily with meals. And pen needles 3/day 15 mL 11  . isosorbide mononitrate (IMDUR) 120 MG 24 hr tablet Take 120 mg by mouth daily.    . Lancets (ONETOUCH ULTRASOFT) lancets Used to check blood sugars four times daily. 200 each 12  . Multiple Vitamin (MULTIVITAMIN WITH MINERALS) TABS tablet Take 1 tablet by mouth daily.    [name] omeprazole (PRILOSEC) 40 MG capsule Take 40 mg by mouth 2 (two) times  daily.    . sodium bicarbonate 650 MG tablet Take 1 tablet (650 mg total) by mouth 2 (two) times daily. 60 tablet 0  . torsemide (DEMADEX) 20 MG tablet Take 2 tablets (40 mg total) by mouth daily. 60 tablet 0  . traMADol (ULTRAM) 50 MG tablet Take 50 mg by mouth every 6 (six) hours as needed for moderate pain or severe pain (Pain). Take with food    . Vitamin D, Ergocalciferol, (DRISDOL) 1.25 MG (50000 UT) CAPS capsule Take 1 capsule (50,000 Units total) by mouth every 7 (seven) days. 4 capsule 0   No facility-administered medications prior to visit.      Past Medical History:  Diagnosis Date  . Allergic rhinitis   . Anemia   . Anxiety   . Back pain   . Bradycardia   . Breast mass    Patient can no longer palpate specific masses but showed tech general area of concern  .  CHF (congestive heart failure) ([place])   . CKD (chronic kidney disease)    STAGE 3  . Constipation   . Diabetes mellitus without complication ([place])   . Dyspnea   . GERD (gastroesophageal reflux disease)   . HLD (hyperlipidemia)   . Hyperparathyroidism ([place])   . Hypertension   . Joint pain   . Leg edema   . Legally blind in left eye, as defined in [place]   . Lymphedema   . Onychomycosis       Past Surgical History:  Procedure Laterality Date  . ABDOMINAL HYSTERECTOMY    . BREAST BIOPSY Left 2014   FNA 12:00 position - Negative  . EYE SURGERY    . IR FLUORO GUIDE CV LINE RIGHT  12/29/2018  . IR US GUIDE VASC ACCESS RIGHT  12/29/2018  . TEE WITHOUT CARDIOVERSION N/A 12/28/2018   Procedure: TRANSESOPHAGEAL ECHOCARDIOGRAM (TEE);  Surgeon: Sueanne Margarita, MD;  Location: [facility] ENDOSCOPY;  Service: Cardiovascular;  Laterality: N/A;      Family History  Problem Relation Age of Onset  . Breast cancer Sister 61  . Diabetes Sister   . Diabetes Mother   . Hypertension Mother   . Hyperlipidemia Mother   . Eating disorder Mother   . Obesity Mother       Social History   Socioeconomic History  . Marital  status: Divorced    Spouse name: Not on file  . Number of children: Not on file  . Years of education: Not on file  . Highest education level: Not on file  Occupational History  . Occupation: Glass blower/designer  Social Needs  . Financial resource strain: Not on file  . Food insecurity:    Worry: Not on file    Inability: Not on file  . Transportation needs:    Medical: Not on file    Non-medical: Not on file  Tobacco Use  . Smoking status: Never Smoker  . Smokeless tobacco: Never Used  Substance and Sexual Activity  . Alcohol use: No  . Drug use: Never  . Sexual activity: Not on file  Lifestyle  . Physical activity:    Days per week: Not on file    Minutes per session: Not on file  . Stress: Not on file  Relationships  . Social connections:    Talks on phone: Not on file    Gets together: Not on file    Attends religious service: Not on file    Active member of club or organization: Not on file    Attends meetings of clubs or organizations: Not on file    Relationship status: Not on file  . Intimate partner violence:    Fear of current or ex partner: Not on file    Emotionally abused: Not on file    Physically abused: Not on file    Forced sexual activity: Not on file  Other Topics Concern  . Not on file  Social History Narrative   ** Merged History Encounter **        Objective:    BP 132/83   Pulse 80   Temp 98 F (36.7 C)  Nursing note and vital signs reviewed.  Physical Exam Constitutional:      General: She is not in acute distress.    Appearance: She is well-developed.     Comments: She is seated comfortably in her wheelchair today.  No distress.  Well-appearing.  Cardiovascular:     Rate and Rhythm: Normal rate  and regular rhythm.     Heart sounds: No murmur.     Comments: Central line on right chest with dressing that is clean and dry and site appears free of infection. Pulmonary:     Effort: Pulmonary effort is normal.     Breath sounds: Normal  breath sounds.  Abdominal:     General: Bowel sounds are normal.     Palpations: Abdomen is soft.  Musculoskeletal:     Comments: We removed her left foot orthopedic boot today.  The left lateral malleolus ulcer has completely epithelialized.  Her legs are significantly improved.  No dry skin or breakdown noted.  She does have Charcot arthropathy of the left ankle.  Skin:    General: Skin is warm and dry.  Neurological:     Mental Status: She is alert and oriented to person, place, and time.          Assessment & Plan:   Problem List Items Addressed This Visit      Unprioritized   Charcot ankle, left    Her chronic ulcer has completely epithelialized and healed over.  There is no tenderness or periwound erythema/fluctuance or signs of infection in the left lower extremity.      Bacteremia due to Streptococcus    She does not have any signs of recurrent or relapsing bacteremia presently.      Aortic valve endocarditis    She has completed 6 weeks as of tomorrow with cefazolin for streptococcal native valve endocarditis involving the aortic valve.  She has done very well with her treatment.  I do not appreciate much of a murmur on exam today.  Likely unable to get follow-up TTE as planned due to current disruption of scheduled procedures from pandemic.  I will refer her back to cardiology to assist with further management and monitoring. We will arrange a follow-up telephone visit in 3 weeks to see how she is doing off of antibiotics.      Relevant Orders   Ambulatory referral to Cardiology   S/P PICC central line placement    We will discuss with IR about the possibility of removing her vascular access device.  I am hopeful that we can get this out as soon as possible considering we have completed her treatment.  With her advanced kidney disease, recurrent cellulitis and ulcers, endocarditis I would really prefer to get this line out of her as soon as possible.      RESOLVED:  Streptococcus infection - Primary    Other Visit Diagnoses    Stage 4 chronic kidney disease ([place])       Relevant Orders   Ambulatory referral to Nephrology     Return in about 3 weeks (around 03/03/2019) for follow up . Phone visit  Janene Madeira, MSN, NP-C [facility] for Infectious Disease [email or web address] Pager: [phone] Office: [phone] [place]: [phone]

## 2019-02-10 NOTE — Assessment & Plan Note (Addendum)
Her baseline creatinines over the last several weeks have remained > 3 up to 3.9.  Her inflammatory markers have been variable.  Her ESR has shown signs of improvement but CRP has elevated.  I wonder if this is in part due to recent systemic streptococcal infection.  Will refer to nephrology in [place] area for management per her request.  

## 2019-02-10 NOTE — Assessment & Plan Note (Signed)
She has completed 6 weeks as of tomorrow with cefazolin for streptococcal native valve endocarditis involving the aortic valve.  She has done very well with her treatment.  I do not appreciate much of a murmur on exam today.  Likely unable to get follow-up TTE as planned due to current disruption of scheduled procedures from pandemic.  I will refer her back to cardiology to assist with further management and monitoring. We will arrange a follow-up telephone visit in 3 weeks to see how she is doing off of antibiotics.

## 2019-02-10 NOTE — Assessment & Plan Note (Signed)
Her chronic ulcer has completely epithelialized and healed over.  There is no tenderness or periwound erythema/fluctuance or signs of infection in the left lower extremity.

## 2019-02-15 ENCOUNTER — Other Ambulatory Visit: Payer: Self-pay | Admitting: Infectious Diseases

## 2019-02-15 ENCOUNTER — Telehealth: Payer: Self-pay | Admitting: Behavioral Health

## 2019-02-15 DIAGNOSIS — Z95828 Presence of other vascular implants and grafts: Secondary | ICD-10-CM

## 2019-02-15 NOTE — Telephone Encounter (Signed)
I haven't been able to connect with IR. Multiple calls to the number listed and no answer. Will continue to try. Please have home health continue to maintain the line until we can get this resolved.

## 2019-02-15 NOTE — Telephone Encounter (Signed)
Spoke with IR team - will you please fax my last note to [facility] @ [phone] with request to have doctor review to remove line.   As far as what to tell Rachel Holmes we are looking at a possible 6 more weeks given the current COVID-19 climate before we can have IR remove. I am hopeful that their team will re-consider and get this out earlier.   Thank you.

## 2019-02-15 NOTE — Telephone Encounter (Signed)
Stephanie's Last office note faxed to [facility] in IR for IR team to review.  Patient notified as well and verbalized understanding.  Patient states she continues to to flush the line as well. Pricilla Riffle RN

## 2019-02-15 NOTE — Telephone Encounter (Signed)
Patient called to inquire about tunneled PIC removal.  Per notes LPN called to try to schedule and they stated Dr. On call had to be called to see about scheduling.  Per Note Colletta Maryland to call Dr. On Call in IR.  Will call patient back once Colletta Maryland gives an update. Pricilla Riffle RN

## 2019-02-17 ENCOUNTER — Ambulatory Visit (INDEPENDENT_AMBULATORY_CARE_PROVIDER_SITE_OTHER): Payer: Medicare HMO | Admitting: Family Medicine

## 2019-02-17 ENCOUNTER — Encounter: Payer: Self-pay | Admitting: Family Medicine

## 2019-02-17 DIAGNOSIS — M1711 Unilateral primary osteoarthritis, right knee: Secondary | ICD-10-CM | POA: Diagnosis not present

## 2019-02-17 NOTE — Assessment & Plan Note (Signed)
Discussed with patient that this is difficult.  Patient unfortunately is not a surgical candidate secondary to other comorbidities but would likely need a knee replacement.  We did discuss how custom brace secondary to patient's thigh to calf ratio and instability of the knee would be the most beneficial.  Patient though declined this and wants to know what we have in the office.  We discussed that patient has had viscosupplementation previously and will consider this as well and see if patient can have prior approval.  If we do get this done in the near future will consider having patient in the office otherwise with the current outbreak I would like to delay 2 to 3 weeks.  We discussed with patient about other things she can do at home such as taking Tylenol on a more regular basis.  Patient will follow-up with [name] depending on approval and how patient is doing and healing from her ulcer as well.

## 2019-02-17 NOTE — Progress Notes (Signed)
[name] [address] [place], [place] [address] Phone: [phone] Subjective:    Virtual Visit via Video Note  I connected with Rondel Jumbo on 02/17/19 at  1:00 PM EDT by a video enabled telemedicine application and verified that I am speaking with the correct person using two identifiers.   I discussed the limitations of evaluation and management by telemedicine and the availability of in person appointments. The patient expressed understanding and agreed to proceed.  Patient was in her residence and that was in the office setting.  Only 2 people on the call.  Difficulty with the visual platform did most of it area    I discussed the assessment and treatment plan with the patient. The patient was provided an opportunity to ask questions and all were answered. The patient agreed with the plan and demonstrated an understanding of the instructions.   The patient was advised to call back or seek an in-person evaluation if the symptoms worsen or if the condition fails to improve as anticipated.  I provided 60mnutes of -face-to-face time during this encounter.   ZLyndal Pulley DO    CC:  Knee pain   HQMG:[id number] CJAZIRA MALONEYis a 58y.o. female coming in with complaint of knee pain.  Patient has been seen previously with severe arthritic changes of the knee.  Patient does have instability and intermittent swelling.  Last injections were greater than a month ago.  Patient does have uncontrolled diabetes and has had difficulty with a nonhealing foot ulcer.  Patient is currently on a PICC line secondary to a strep infection that did cause an endocarditis.  Patient states is having increasing instability of the knee.  Steroid injection did help with the pain initially but now pain is about a 6 out of 10.  Not as severe as when she was seen a month ago but continues to have discomfort but is more concerned with the instability.  Patient had been  offered a custom brace previously which she declined.      Past Medical History:  Diagnosis Date  . Allergic rhinitis   . Anemia   . Anxiety   . Back pain   . Bradycardia   . Breast mass    Patient can no longer palpate specific masses but showed tech general area of concern  . CHF (congestive heart failure) ([place]   . CKD (chronic kidney disease)    STAGE 3  . Constipation   . Diabetes mellitus without complication ([place]   . Dyspnea   . GERD (gastroesophageal reflux disease)   . HLD (hyperlipidemia)   . Hyperparathyroidism ([place]   . Hypertension   . Joint pain   . Leg edema   . Legally [place] in left [place], [place]  . Lymphedema   . Onychomycosis    Past Surgical History:  Procedure Laterality Date  . ABDOMINAL HYSTERECTOMY    . BREAST BIOPSY Left 2014   FNA 12:00 position - Negative  . EYE SURGERY    . IR FLUORO GUIDE CV LINE RIGHT  12/29/2018  . IR [place] VASC ACCESS RIGHT  12/29/2018  . TEE WITHOUT CARDIOVERSION N/A 12/28/2018   Procedure: TRANSESOPHAGEAL ECHOCARDIOGRAM (TEE);  Surgeon: TSueanne Margarita MD;  [facility];  Service: Cardiovascular;  Laterality: N/A;   Social History   Socioeconomic History  . Marital status: Divorced    Spouse name: Not on file  . Number  of children: Not on file  . Years of education: Not on file  . Highest education level: Not on file  Occupational History  . Occupation: Glass blower/designer  Social Needs  . Financial resource strain: Not on file  . Food insecurity:    Worry: Not on file    Inability: Not on file  . Transportation needs:    Medical: Not on file    Non-medical: Not on file  Tobacco Use  . Smoking status: Never Smoker  . Smokeless tobacco: Never Used  Substance and Sexual Activity  . Alcohol use: No  . Drug use: Never  . Sexual activity: Not on file  Lifestyle  . Physical activity:    Days per week: Not on file    Minutes per session: Not on file  . Stress: Not on file  Relationships   . Social connections:    Talks on phone: Not on file    Gets together: Not on file    Attends religious service: Not on file    Active member of club or organization: Not on file    Attends meetings of clubs or organizations: Not on file    Relationship status: Not on file  Other Topics Concern  . Not on file  Social History Narrative   ** Merged History Encounter **       Allergies  Allergen Reactions  . Statins Shortness Of Breath    Wheezing, short of breath  . Codeine Nausea Only  . Ibuprofen Other (See Comments)    Reaction:  Raises pts BP  . Penicillins Hives and Other (See Comments)    Has patient had a PCN reaction causing immediate rash, facial/tongue/throat swelling, SOB or lightheadedness with hypotension: No Has patient had a PCN reaction causing severe rash involving mucus membranes or skin necrosis: No Has patient had a PCN reaction that required hospitalization No Has patient had a PCN reaction occurring within the last 10 years: No If all of the above answers are "NO", then may proceed with Cephalosporin use.  [name] Percocet [Oxycodone-Acetaminophen] Nausea Only  . Tramadol Nausea Only    Can take if she has eaten  . Vicodin [Hydrocodone-Acetaminophen] Nausea Only   Family History  Problem Relation Age of Onset  . Breast cancer Sister 76  . Diabetes Sister   . Diabetes Mother   . Hypertension Mother   . Hyperlipidemia Mother   . Eating disorder Mother   . Obesity Mother     Current Outpatient Medications (Endocrine & Metabolic):  [name]  Dulaglutide (TRULICITY) 1.5 ZT/2.4PY SOPN, Inject 1.5 mg into the skin once a week. (Patient taking differently: Inject 1.5 mg into the skin once a week. sunday) .  insulin aspart (NOVOLOG FLEXPEN) 100 UNIT/ML FlexPen, Inject 12 Units into the skin 3 (three) times daily with meals. And pen needles 3/day  Current Outpatient Medications (Cardiovascular):  .  amLODipine (NORVASC) 10 MG tablet, Take 10 mg by mouth daily. .   cloNIDine (CATAPRES) 0.3 MG tablet, Take 0.3 mg by mouth 2 (two) times daily. .  isosorbide mononitrate (IMDUR) 120 MG 24 hr tablet, Take 120 mg by mouth daily. [name]  torsemide (DEMADEX) 20 MG tablet, Take 2 tablets (40 mg total) by mouth daily.  Current Outpatient Medications (Respiratory):  .  albuterol (PROVENTIL HFA;VENTOLIN HFA) 108 (90 Base) MCG/ACT inhaler, Inhale 1-2 puffs into the lungs every 6 (six) hours as needed for wheezing or shortness of breath.  Current Outpatient Medications (Analgesics):  .  aspirin  EC 325 MG tablet, Take 325 mg by mouth daily. .  traMADol (ULTRAM) 50 MG tablet, Take 50 mg by mouth every 6 (six) hours as needed for moderate pain or severe pain (Pain). Take with food   Current Outpatient Medications (Other):  .  Blood Glucose Monitoring Suppl (ONE TOUCH ULTRA 2) w/Device KIT, 1 Device by Does not apply route daily. [name]  ceFAZolin (ANCEF) IVPB, Inject 2 g into the vein every 12 (twelve) hours. Indication:  Endocarditis Last Day of Therapy:  02/11/19 Labs - Once weekly:  CBC/D and BMP Labs - Every other week:  ESR and CRP .  DULoxetine (CYMBALTA) 30 MG capsule, Take 30 mg by mouth daily.  [name]  gabapentin (NEURONTIN) 600 MG tablet, Take 600 mg by mouth at bedtime. [name]  glucose blood (ONETOUCH VERIO) test strip, 1 each by Other route 2 (two) times daily. And lancets 1/day .  Lancets (ONETOUCH ULTRASOFT) lancets, Used to check blood sugars four times daily. .  Multiple Vitamin (MULTIVITAMIN WITH MINERALS) TABS tablet, Take 1 tablet by mouth daily. [name]  omeprazole (PRILOSEC) 40 MG capsule, Take 40 mg by mouth 2 (two) times daily. .  sodium bicarbonate 650 MG tablet, Take 1 tablet (650 mg total) by mouth 2 (two) times daily. .  Vitamin D, Ergocalciferol, (DRISDOL) 1.25 MG (50000 UT) CAPS capsule, Take 1 capsule (50,000 Units total) by mouth every 7 (seven) days.    Past medical history, social, surgical and family history all reviewed in electronic medical record.  No  pertanent information unless stated regarding to the chief complaint.   Review of Systems:  No headache, visual changes, nausea, vomiting, diarrhea, constipation, dizziness, abdominal pain, skin rash, fevers, chills, night sweats, weight loss, swollen lymph nodes,  chest pain, shortness of breath, mood changes.  Positive joint swelling, muscle aches, lower extremity swelling, body aches  Objective     General: No apparent distress alert and oriented x3 mood and affect normal, dressed appropriately.      Impression and Recommendations:     This case required medical decision making of moderate complexity. The above documentation has been reviewed and is accurate and complete Lyndal Pulley, DO       Note: This dictation was prepared with Dragon dictation along with smaller phrase technology. Any transcriptional errors that result from this process are unintentional.

## 2019-02-18 ENCOUNTER — Encounter: Payer: Self-pay | Admitting: Endocrinology

## 2019-02-18 ENCOUNTER — Other Ambulatory Visit: Payer: Self-pay | Admitting: Radiology

## 2019-02-18 ENCOUNTER — Ambulatory Visit: Payer: Medicare HMO | Admitting: Sports Medicine

## 2019-02-19 ENCOUNTER — Other Ambulatory Visit: Payer: Self-pay

## 2019-02-19 ENCOUNTER — Ambulatory Visit (HOSPITAL_COMMUNITY)
Admission: RE | Admit: 2019-02-19 | Discharge: 2019-02-19 | Disposition: A | Discharge status: Home / Self Care | Payer: Medicare HMO | Source: Ambulatory Visit | Attending: Infectious Diseases | Admitting: Infectious Diseases

## 2019-02-19 ENCOUNTER — Other Ambulatory Visit: Payer: Self-pay | Admitting: Infectious Diseases

## 2019-02-19 ENCOUNTER — Ambulatory Visit (INDEPENDENT_AMBULATORY_CARE_PROVIDER_SITE_OTHER): Payer: Medicare HMO | Admitting: Endocrinology

## 2019-02-19 ENCOUNTER — Encounter (HOSPITAL_COMMUNITY): Payer: Self-pay | Admitting: Student

## 2019-02-19 DIAGNOSIS — Z95828 Presence of other vascular implants and grafts: Secondary | ICD-10-CM

## 2019-02-19 DIAGNOSIS — E1165 Type 2 diabetes mellitus with hyperglycemia: Secondary | ICD-10-CM

## 2019-02-19 DIAGNOSIS — E1142 Type 2 diabetes mellitus with diabetic polyneuropathy: Secondary | ICD-10-CM

## 2019-02-19 DIAGNOSIS — Z452 Encounter for adjustment and management of vascular access device: Secondary | ICD-10-CM | POA: Insufficient documentation

## 2019-02-19 DIAGNOSIS — IMO0002 Reserved for concepts with insufficient information to code with codable children: Secondary | ICD-10-CM

## 2019-02-19 HISTORY — PX: IR REMOVAL TUN CV CATH W/O FL: IMG2289

## 2019-02-19 MED ORDER — INSULIN ASPART 100 UNIT/ML FLEXPEN: PEN_INJECTOR | SUBCUTANEOUS | 11 refills | Status: DC

## 2019-02-19 NOTE — Patient Instructions (Addendum)
I have sent a prescription to your pharmacy, to change the Novolog to 3 times a day (just before each meal), 08-18-11 units, and:  Please continue the same Trulicity. These instructions are confirmed by readback.     check your blood sugar twice a day.  vary the time of day when you check, between before the 3 meals, and at bedtime.  also check if you have symptoms of your blood sugar being too high or too low.  please keep a record of the readings and bring it to your next appointment here (or you can bring the meter itself).  You can write it on any piece of paper.  please call us sooner if your blood sugar goes below 70, or if you have a lot of readings over 200.  Please call or message [name] next week, to tell us how the blood sugar is doing.   Please come back for a follow-up appointment in 4-6 weeks.

## 2019-02-19 NOTE — Progress Notes (Addendum)
Subjective:    Patient ID: Rachel Holmes, female    DOB: 1961-05-23, 58 y.o.   MRN: [id number]  HPI  telehealth visit today via doxy video visit.  Alternatives to telehealth are presented to this patient, and the patient agrees to the telehealth visit. Pt is advised of the cost of the visit, and agrees to this, also.   Patient is at home, and I am at the office.   Pt returns for f/u of diabetes mellitus: DM type: Insulin-requiring type 2 Dx'ed: 1988, during a pregnancy, but it persisted after Complications: polyneuropathy, renal failure, leg ulcer, Charcot foot, and PDR.   Therapy: insulin since 0932, and trulicity DKA: never Severe hypoglycemia: 2 episodes (both many years ago).   Pancreatitis: never Pancreatic imaging: normal on 2008 [name].   Other: a trial off insulin in erly 2020 was unsuccessful; renal failure limits rx options; she does not need basal insulin.   Interval history: pt states cbg's vary from 59-320.  It is lowest after breakfast, and highest in the afternoon.  She says cbg averages 168.   Past Medical History:  Diagnosis Date  . Allergic rhinitis   . Anemia   . Anxiety   . Back pain   . Bradycardia   . Breast mass    Patient can no longer palpate specific masses but showed tech general area of concern  . CHF (congestive heart failure) ([place])   . CKD (chronic kidney disease)    STAGE 3  . Constipation   . Diabetes mellitus without complication ([place])   . Dyspnea   . GERD (gastroesophageal reflux disease)   . HLD (hyperlipidemia)   . Hyperparathyroidism ([place])   . Hypertension   . Joint pain   . Leg edema   . Legally blind in left eye, as defined in [place]   . Lymphedema   . Onychomycosis     Past Surgical History:  Procedure Laterality Date  . ABDOMINAL HYSTERECTOMY    . BREAST BIOPSY Left 2014   FNA 12:00 position - Negative  . EYE SURGERY    . IR FLUORO GUIDE CV LINE RIGHT  12/29/2018  . IR REMOVAL TUN CV CATH W/O FL  02/19/2019  . IR US GUIDE VASC  ACCESS RIGHT  12/29/2018  . TEE WITHOUT CARDIOVERSION N/A 12/28/2018   Procedure: TRANSESOPHAGEAL ECHOCARDIOGRAM (TEE);  Surgeon: Sueanne Margarita, MD;  Location: [facility];  Service: Cardiovascular;  Laterality: N/A;    Social History   Socioeconomic History  . Marital status: Divorced    Spouse name: Not on file  . Number of children: Not on file  . Years of education: Not on file  . Highest education level: Not on file  Occupational History  . Occupation: Glass blower/designer  Social Needs  . Financial resource strain: Not on file  . Food insecurity:    Worry: Not on file    Inability: Not on file  . Transportation needs:    Medical: Not on file    Non-medical: Not on file  Tobacco Use  . Smoking status: Never Smoker  . Smokeless tobacco: Never Used  Substance and Sexual Activity  . Alcohol use: No  . Drug use: Never  . Sexual activity: Not on file  Lifestyle  . Physical activity:    Days per week: Not on file    Minutes per session: Not on file  . Stress: Not on file  Relationships  . Social connections:    Talks on phone: Not  on file    Gets together: Not on file    Attends religious service: Not on file    Active member of club or organization: Not on file    Attends meetings of clubs or organizations: Not on file    Relationship status: Not on file  . Intimate partner violence:    Fear of current or ex partner: Not on file    Emotionally abused: Not on file    Physically abused: Not on file    Forced sexual activity: Not on file  Other Topics Concern  . Not on file  Social History Narrative   ** Merged History Encounter **        Current Outpatient Medications on File Prior to Visit  Medication Sig Dispense Refill  . albuterol (PROVENTIL HFA;VENTOLIN HFA) 108 (90 Base) MCG/ACT inhaler Inhale 1-2 puffs into the lungs every 6 (six) hours as needed for wheezing or shortness of breath.    [name] amLODipine (NORVASC) 10 MG tablet Take 10 mg by mouth daily.    [name]  aspirin EC 325 MG tablet Take 325 mg by mouth daily.    . Blood Glucose Monitoring Suppl (ONE TOUCH ULTRA 2) w/Device KIT 1 Device by Does not apply route daily. 1 each 0  . ceFAZolin (ANCEF) IVPB Inject 2 g into the vein every 12 (twelve) hours. Indication:  Endocarditis Last Day of Therapy:  02/11/19 Labs - Once weekly:  CBC/D and BMP Labs - Every other week:  ESR and CRP 84 Units 0  . cloNIDine (CATAPRES) 0.3 MG tablet Take 0.3 mg by mouth 2 (two) times daily.    . Dulaglutide (TRULICITY) 1.5 [place]/7.0WC SOPN Inject 1.5 mg into the skin once a week. (Patient taking differently: Inject 1.5 mg into the skin once a week. sunday) 4 pen 11  . DULoxetine (CYMBALTA) 30 MG capsule Take 30 mg by mouth daily.     [name] gabapentin (NEURONTIN) 600 MG tablet Take 600 mg by mouth at bedtime.    [name] glucose blood (ONETOUCH VERIO) test strip 1 each by Other route 2 (two) times daily. And lancets 1/day 100 each 5  . isosorbide mononitrate (IMDUR) 120 MG 24 hr tablet Take 120 mg by mouth daily.    . Lancets (ONETOUCH ULTRASOFT) lancets Used to check blood sugars four times daily. 200 each 12  . Multiple Vitamin (MULTIVITAMIN WITH MINERALS) TABS tablet Take 1 tablet by mouth daily.    [name] omeprazole (PRILOSEC) 40 MG capsule Take 40 mg by mouth 2 (two) times daily.    . sodium bicarbonate 650 MG tablet Take 1 tablet (650 mg total) by mouth 2 (two) times daily. 60 tablet 0  . torsemide (DEMADEX) 20 MG tablet Take 2 tablets (40 mg total) by mouth daily. 60 tablet 0  . traMADol (ULTRAM) 50 MG tablet Take 50 mg by mouth every 6 (six) hours as needed for moderate pain or severe pain (Pain). Take with food    . Vitamin D, Ergocalciferol, (DRISDOL) 1.25 MG (50000 UT) CAPS capsule Take 1 capsule (50,000 Units total) by mouth every 7 (seven) days. 4 capsule 0   No current facility-administered medications on file prior to visit.     Allergies  Allergen Reactions  . Statins Shortness Of Breath    Wheezing, short of breath  .  Codeine Nausea Only  . Ibuprofen Other (See Comments)    Reaction:  Raises pts BP  . Penicillins Hives and Other (See Comments)    Has patient  had a PCN reaction causing immediate rash, facial/tongue/throat swelling, SOB or lightheadedness with hypotension: No Has patient had a PCN reaction causing severe rash involving mucus membranes or skin necrosis: No Has patient had a PCN reaction that required hospitalization No Has patient had a PCN reaction occurring within the last 10 years: No If all of the above answers are "NO", then may proceed with Cephalosporin use.  [name] Percocet [Oxycodone-Acetaminophen] Nausea Only  . Tramadol Nausea Only    Can take if she has eaten  . Vicodin [Hydrocodone-Acetaminophen] Nausea Only    Family History  Problem Relation Age of Onset  . Breast cancer Sister 61  . Diabetes Sister   . Diabetes Mother   . Hypertension Mother   . Hyperlipidemia Mother   . Eating disorder Mother   . Obesity Mother     There were no vitals taken for this visit.   Review of Systems She denies LOC.     Objective:   Physical Exam    Lab Results  Component Value Date   HGBA1C 8.4 01/22/2019       Assessment & Plan:  Insulin-requiring type 2 DM, with PDR: Based on the pattern of her cbg's, she needs some adjustment in her therapy Renal failure: in this setting, she does not need basal insulin Hypoglycemia: This limits aggressiveness of glycemic control   Patient Instructions  I have sent a prescription to your pharmacy, to change the Novolog to 3 times a day (just before each meal), 08-18-11 units, and:  Please continue the same Trulicity. These instructions are confirmed by readback.     check your blood sugar twice a day.  vary the time of day when you check, between before the 3 meals, and at bedtime.  also check if you have symptoms of your blood sugar being too high or too low.  please keep a record of the readings and bring it to your next appointment  here (or you can bring the meter itself).  You can write it on any piece of paper.  please call us sooner if your blood sugar goes below 70, or if you have a lot of readings over 200.  Please call or message [name] next week, to tell us how the blood sugar is doing.   Please come back for a follow-up appointment in 4-6 weeks.

## 2019-03-01 ENCOUNTER — Telehealth: Payer: Self-pay | Admitting: Cardiovascular Disease

## 2019-03-01 NOTE — Telephone Encounter (Signed)
Virtual Visit Pre-Appointment Phone Call  "(Name), I am calling you today to discuss your upcoming appointment. We are currently trying to limit exposure to the virus that causes COVID-19 by seeing patients at home rather than in the office."  1. "What is the BEST phone number to call the day of the visit?" - include this in appointment notes  2. Do you have or have access to (through a family member/friend) a smartphone with video capability that we can use for your visit?" a. If yes - list this number in appt notes as cell (if different from BEST phone #) and list the appointment type as a VIDEO visit in appointment notes b. If no - list the appointment type as a PHONE visit in appointment notes  3. Confirm consent - "In the setting of the current Covid19 crisis, you are scheduled for a (phone or video) visit with your provider on (date) at (time).  Just as we do with many in-office visits, in order for you to participate in this visit, we must obtain consent.  If you'd like, I can send this to your mychart (if signed up) or email for you to review.  Otherwise, I can obtain your verbal consent now.  All virtual visits are billed to your insurance company just like a normal visit would be.  By agreeing to a virtual visit, we'd like you to understand that the technology does not allow for your provider to perform an examination, and thus may limit your provider's ability to fully assess your condition. If your provider identifies any concerns that need to be evaluated in person, we will make arrangements to do so.  Finally, though the technology is pretty good, we cannot assure that it will always work on either your or our end, and in the setting of a video visit, we may have to convert it to a phone-only visit.  In either situation, we cannot ensure that we have a secure connection.  Are you willing to proceed?" STAFF: Did the patient verbally acknowledge consent to telehealth visit? Document  YES/NO here: yes  4. Advise patient to be prepared - "Two hours prior to your appointment, go ahead and check your blood pressure, pulse, oxygen saturation, and your weight (if you have the equipment to check those) and write them all down. When your visit starts, your provider will ask you for this information. If you have an Apple Watch or Kardia device, please plan to have heart rate information ready on the day of your appointment. Please have a pen and paper handy nearby the day of the visit as well."  5. Give patient instructions for MyChart download to smartphone OR Doximity/Doxy.me as below if video visit (depending on what platform provider is using)  6. Inform patient they will receive a phone call 15 minutes prior to their appointment time (may be from unknown caller ID) so they should be prepared to answer    TELEPHONE CALL NOTE  Rachel Holmes has been deemed a candidate for a follow-up tele-health visit to limit community exposure during the Covid-19 pandemic. I spoke with the patient via phone to ensure availability of phone/video source, confirm preferred email & phone number, and discuss instructions and expectations.  I reminded Rachel Holmes to be prepared with any vital sign and/or heart rhythm information that could potentially be obtained via home monitoring, at the time of her visit. I reminded Rachel Holmes to expect a phone call prior to  her visit.  Clarisse Gouge 03/01/2019 2:47 PM   INSTRUCTIONS FOR DOWNLOADING THE MYCHART APP TO SMARTPHONE  - The patient must first make sure to have activated MyChart and know their login information - If Apple, go to [employer] and type in MyChart in the search bar and download the app. If Android, ask patient to go to [employer] and type in [place] in the search bar and download the app. The app is free but as with any other app downloads, their phone may require them to verify saved payment information or  Apple/Android password.  - The patient will need to then log into the app with their MyChart username and password, and select [facility] as their healthcare provider to link the account. When it is time for your visit, go to the MyChart app, find appointments, and click Begin Video Visit. Be sure to Select Allow for your device to access the Microphone and Camera for your visit. You will then be connected, and your provider will be with you shortly.  **If they have any issues connecting, or need assistance please contact MyChart service desk [phone])**  **If using a computer, in order to ensure the best quality for their visit they will need to use either of the following Internet Browsers: [employer], or Google Chrome**  IF USING DOXIMITY or DOXY.ME - The patient will receive a link just prior to their visit by text.     FULL LENGTH CONSENT FOR TELE-HEALTH VISIT   I hereby voluntarily request, consent and authorize [facility] [facility] and its employed or contracted physicians, physician assistants, nurse practitioners or other licensed health care professionals (the Practitioner), to provide me with telemedicine health care services (the Services") as deemed necessary by the treating Practitioner. I acknowledge and consent to receive the Services by the Practitioner via telemedicine. I understand that the telemedicine visit will involve communicating with the Practitioner through live audiovisual communication technology and the disclosure of certain medical information by electronic transmission. I acknowledge that I have been given the opportunity to request an in-person assessment or other available alternative prior to the telemedicine visit and am voluntarily participating in the telemedicine visit.  I understand that I have the right to withhold or withdraw my consent to the use of telemedicine in the course of my care at any time, without affecting my right to future care  or treatment, and that the Practitioner or I may terminate the telemedicine visit at any time. I understand that I have the right to inspect all information obtained and/or recorded in the course of the telemedicine visit and may receive copies of available information for a reasonable fee.  I understand that some of the potential risks of receiving the Services via telemedicine include:   Delay or interruption in medical evaluation due to technological equipment failure or disruption;  Information transmitted may not be sufficient (e.g. poor resolution of images) to allow for appropriate medical decision making by the Practitioner; and/or   In rare instances, security protocols could fail, causing a breach of personal health information.  Furthermore, I acknowledge that it is my responsibility to provide information about my medical history, conditions and care that is complete and accurate to the best of my ability. I acknowledge that Practitioner's advice, recommendations, and/or decision may be based on factors not within their control, such as incomplete or inaccurate data provided by me or distortions of diagnostic images or specimens that may result from electronic transmissions. I  understand that the practice of medicine is not an exact science and that Practitioner makes no warranties or guarantees regarding treatment outcomes. I acknowledge that I will receive a copy of this consent concurrently upon execution via email to the email address I last provided but may also request a printed copy by calling the office of [facility].    I understand that my insurance will be billed for this visit.   I have read or had this consent read to me.  I understand the contents of this consent, which adequately explains the benefits and risks of the Services being provided via telemedicine.   I have been provided ample opportunity to ask questions regarding this consent and the Services and have had  my questions answered to my satisfaction.  I give my informed consent for the services to be provided through the use of telemedicine in my medical care  By participating in this telemedicine visit I agree to the above.

## 2019-03-03 NOTE — Progress Notes (Signed)
Virtual Visit via Video Note   This visit type was conducted due to national recommendations for restrictions regarding the COVID-19 Pandemic (e.g. social distancing) in an effort to limit this patient's exposure and mitigate transmission in our community.  Due to her co-morbid illnesses, this patient is at least at moderate risk for complications without adequate follow up.  This format is felt to be most appropriate for this patient at this time.  All issues noted in this document were discussed and addressed.  A limited physical exam was performed with this format.  Please refer to the patient's chart for her consent to telehealth for [facility].   I connected with  Rachel Holmes on 03/04/19 by a video enabled telemedicine application and verified that I am speaking with the correct person using two identifiers. I discussed the limitations of evaluation and management by telemedicine. The patient expressed understanding and agreed to proceed.   Evaluation Performed:  Follow-up visit  Date:  03/04/2019   ID:  Rachel, Holmes [id number], MRN [id number]  Patient Location:  [address] [place] [address]   Provider location:   [facility], [place] office  PCP:  Sharyne Peach, MD  Cardiologist:  Arvid Right [facility]   Chief Complaint:  SOB, endocarditis  New Patient  History of Present Illness:    Rachel Holmes is a 58 y.o. female who presents via audio/video conferencing for a telehealth visit today.   The patient does not symptoms concerning for COVID-19 infection (fever, chills, cough, or new SHORTNESS OF BREATH).   Patient has a past medical history of Hypertension;  Anemia of chronic disease;  Obesity, unspecified;  CHF (congestive heart failure) (CMS-HCC);  GERD (gastroesophageal reflux disease);  Bradycardia;  Anxiety,  Hyperparathyroidism (CMS-HCC); Increased PTH level;  CKD (chronic kidney disease) stage 3, GFR 30-59 ml/min;    Type 2 diabetes mellitus with stage 3 chronic kidney disease (CMS-HCC);   Lymphedema   Lives with exhusband  Hx of edema and chest pain. Evaluated by cardiiology at [facility] 2017  diagnosed with congestive heart failure about 12 years ago at [facility].  In hospital 12/2018 Sepsis secondary to group G streptococcal bacteremia withaortic valve endocarditis and left leg soft tissue infection: Hospital records reviewed with the patient in detail Both TTE and TEE confirmed aortic valve endocarditis. Acute hypoxic respiratory failure likely secondary to aspiration pneumonia and decompensated diastolic heart failure: Occurred after extubation post TEE on 2/24, required BiPAP briefly. --done with ABX  CR 3.41 in 01/25/2019 On torsemide 40 daily No SOB, no leg swelling Can't walk, has charcot foot. Has a boot  Followed by nephrology Prior to recent hospitalization was only taking Lasix once a day, was discharged on torsemide 40 daily Reports that she does not drink very much, does not make much urine  Hemoglobin A1c 8.4   [facility] records reviewed from 2017 as detailed below stress test done Prior cardiac catheterization.  echo in our system  2008 which showed normal LV systolic function.    long-standing hypertension, has been on diuretics since her diagnosis of heart failure.   chronic edema in both lower extremities, left usually worse than right.   some swelling in her hands.  furosemide  20-40 mg daily    chronic class II exertional dyspnea   episode of cht discomfort . She was sitting in her bed and had an episode of right sided upper chest discomfort which  lasted only a few seconds.    hemoglobin A1c of 10.2%. LDL was 111, HDL 39, triglycerides 124.  She has been intolerant of a number of medications    Prior CV studies:   The following studies were reviewed today:  Echo 2017 NORMAL LEFT VENTRICULAR SYSTOLIC FUNCTION  WITH MILD LVH NORMAL  RIGHT VENTRICULAR SYSTOLIC FUNCTION MILD VALVULAR REGURGITATION (See above) NO PERICARDIAL EFFUSION GRADE 2 DIASTOLIC DYSFUNCTION MILD AORTIC STENOSIS MILDLY DILATED LEFT ATRIUM MILD PULMONARY HTN WITH ESTIMATED RVSP = 45 MMHG   Echo 12/2018 . Small aortic valve mass, cannot exclude endocarditis vegetation. Measures approximately 0.75 x 1 cm. Demonstrates features of independent motion and is primarily seen on the ventricular aspect of the aortic valve. Best visualized in parasternal long  axis and short axis views. No significant aortic valve stenosis or regurgitation.  2. The left ventricle has normal systolic function, with an ejection fraction of 55-60%. The cavity size was normal. Left ventricular diastolic Doppler parameters are consistent with impaired relaxation.  3. The right ventricle has normal systolic function. The cavity was normal. There is no increase in right ventricular wall thickness. Right ventricular systolic pressure is mildly elevated with an estimated pressure of 35.5 mmHg.  4. The mitral valve is normal in structure.  5. The tricuspid valve is normal in structure.  6. The pulmonic valve was normal in structure.  7. The aortic valve is normal in structure.  8. Cannot exclude small PFO with left to right shunt by color flow Doppler.  9. The inferior vena cava was dilated in size with <50% respiratory variability.   Past Medical History:  Diagnosis Date  . Allergic rhinitis   . Anemia   . Anxiety   . Back pain   . Bradycardia   . Breast mass    Patient can no longer palpate specific masses but showed tech general area of concern  . CHF (congestive heart failure) ([place])   . CKD (chronic kidney disease)    STAGE 3  . Constipation   . Diabetes mellitus without complication ([place])   . Dyspnea   . GERD (gastroesophageal reflux disease)   . HLD (hyperlipidemia)   . Hyperparathyroidism ([place])   . Hypertension   . Joint pain   . Leg edema   . Legally blind in left  eye, as defined in [place]   . Lymphedema   . Onychomycosis    Past Surgical History:  Procedure Laterality Date  . ABDOMINAL HYSTERECTOMY    . BREAST BIOPSY Left 2014   FNA 12:00 position - Negative  . EYE SURGERY    . IR FLUORO GUIDE CV LINE RIGHT  12/29/2018  . IR REMOVAL TUN CV CATH W/O FL  02/19/2019  . IR US GUIDE VASC ACCESS RIGHT  12/29/2018  . TEE WITHOUT CARDIOVERSION N/A 12/28/2018   Procedure: TRANSESOPHAGEAL ECHOCARDIOGRAM (TEE);  Surgeon: Sueanne Margarita, MD;  Location: [facility] ENDOSCOPY;  Service: Cardiovascular;  Laterality: N/A;     Current Meds  Medication Sig  . albuterol (PROVENTIL HFA;VENTOLIN HFA) 108 (90 Base) MCG/ACT inhaler Inhale 1-2 puffs into the lungs every 6 (six) hours as needed for wheezing or shortness of breath.  [name] amLODipine (NORVASC) 10 MG tablet Take 10 mg by mouth daily.  [name] aspirin EC 325 MG tablet Take 325 mg by mouth daily.  . Blood Glucose Monitoring Suppl (ONE TOUCH ULTRA 2) w/Device KIT 1 Device by Does not apply route daily.  [name] ceFAZolin (ANCEF) IVPB Inject 2 g into the  vein every 12 (twelve) hours. Indication:  Endocarditis Last Day of Therapy:  02/11/19 Labs - Once weekly:  CBC/D and BMP Labs - Every other week:  ESR and CRP  . cloNIDine (CATAPRES) 0.3 MG tablet Take 0.3 mg by mouth 2 (two) times daily.  . Dulaglutide (TRULICITY) 1.5 BT/5.9RC SOPN Inject 1.5 mg into the skin once a week. (Patient taking differently: Inject 1.5 mg into the skin once a week. sunday)  . DULoxetine (CYMBALTA) 30 MG capsule Take 30 mg by mouth daily.   [name] gabapentin (NEURONTIN) 600 MG tablet Take 600 mg by mouth at bedtime.  [name] glucose blood (ONETOUCH VERIO) test strip 1 each by Other route 2 (two) times daily. And lancets 1/day  . insulin aspart (NOVOLOG FLEXPEN) 100 UNIT/ML FlexPen 3 times a day (just before each meal) 08-18-11 units, and pen needles 3/day  . isosorbide mononitrate (IMDUR) 120 MG 24 hr tablet Take 120 mg by mouth daily.  . Lancets (ONETOUCH ULTRASOFT)  lancets Used to check blood sugars four times daily.  . Multiple Vitamin (MULTIVITAMIN WITH MINERALS) TABS tablet Take 1 tablet by mouth daily.  [name] omeprazole (PRILOSEC) 40 MG capsule Take 40 mg by mouth 2 (two) times daily.  . sodium bicarbonate 650 MG tablet Take 1 tablet (650 mg total) by mouth 2 (two) times daily.  [name] torsemide (DEMADEX) 20 MG tablet Take 2 tablets (40 mg total) by mouth daily.  . traMADol (ULTRAM) 50 MG tablet Take 50 mg by mouth every 6 (six) hours as needed for moderate pain or severe pain (Pain). Take with food  . Vitamin D, Ergocalciferol, (DRISDOL) 1.25 MG (50000 UT) CAPS capsule Take 1 capsule (50,000 Units total) by mouth every 7 (seven) days.     Allergies:   Statins; Codeine; Ibuprofen; Penicillins; Percocet [oxycodone-acetaminophen]; Tramadol; and Vicodin [hydrocodone-acetaminophen]   Social History   Tobacco Use  . Smoking status: Never Smoker  . Smokeless tobacco: Never Used  Substance Use Topics  . Alcohol use: No  . Drug use: Never     Current Outpatient Medications on File Prior to Visit  Medication Sig Dispense Refill  . albuterol (PROVENTIL HFA;VENTOLIN HFA) 108 (90 Base) MCG/ACT inhaler Inhale 1-2 puffs into the lungs every 6 (six) hours as needed for wheezing or shortness of breath.    [name] amLODipine (NORVASC) 10 MG tablet Take 10 mg by mouth daily.    [name] aspirin EC 325 MG tablet Take 325 mg by mouth daily.    . Blood Glucose Monitoring Suppl (ONE TOUCH ULTRA 2) w/Device KIT 1 Device by Does not apply route daily. 1 each 0  . ceFAZolin (ANCEF) IVPB Inject 2 g into the vein every 12 (twelve) hours. Indication:  Endocarditis Last Day of Therapy:  02/11/19 Labs - Once weekly:  CBC/D and BMP Labs - Every other week:  ESR and CRP 84 Units 0  . cloNIDine (CATAPRES) 0.3 MG tablet Take 0.3 mg by mouth 2 (two) times daily.    . Dulaglutide (TRULICITY) 1.5 BU/3.8GT SOPN Inject 1.5 mg into the skin once a week. (Patient taking differently: Inject 1.5 mg into the  skin once a week. sunday) 4 pen 11  . DULoxetine (CYMBALTA) 30 MG capsule Take 30 mg by mouth daily.     [name] gabapentin (NEURONTIN) 600 MG tablet Take 600 mg by mouth at bedtime.    [name] glucose blood (ONETOUCH VERIO) test strip 1 each by Other route 2 (two) times daily. And lancets 1/day 100 each 5  .  insulin aspart (NOVOLOG FLEXPEN) 100 UNIT/ML FlexPen 3 times a day (just before each meal) 08-18-11 units, and pen needles 3/day 15 mL 11  . isosorbide mononitrate (IMDUR) 120 MG 24 hr tablet Take 120 mg by mouth daily.    . Lancets (ONETOUCH ULTRASOFT) lancets Used to check blood sugars four times daily. 200 each 12  . Multiple Vitamin (MULTIVITAMIN WITH MINERALS) TABS tablet Take 1 tablet by mouth daily.    [name] omeprazole (PRILOSEC) 40 MG capsule Take 40 mg by mouth 2 (two) times daily.    . sodium bicarbonate 650 MG tablet Take 1 tablet (650 mg total) by mouth 2 (two) times daily. 60 tablet 0  . torsemide (DEMADEX) 20 MG tablet Take 2 tablets (40 mg total) by mouth daily. 60 tablet 0  . traMADol (ULTRAM) 50 MG tablet Take 50 mg by mouth every 6 (six) hours as needed for moderate pain or severe pain (Pain). Take with food    . Vitamin D, Ergocalciferol, (DRISDOL) 1.25 MG (50000 UT) CAPS capsule Take 1 capsule (50,000 Units total) by mouth every 7 (seven) days. 4 capsule 0   No current facility-administered medications on file prior to visit.      Family Hx: The patient's family history includes Breast cancer (age of onset: 98) in her sister; Diabetes in her mother and sister; Eating disorder in her mother; Hyperlipidemia in her mother; Hypertension in her mother; Obesity in her mother.  ROS:   Please see the history of present illness.    Review of Systems  Constitutional: Negative.   Respiratory: Negative.   Cardiovascular: Negative.   Gastrointestinal: Negative.   Musculoskeletal: Positive for joint pain.  Neurological: Negative.   Psychiatric/Behavioral: Negative.   All other systems  reviewed and are negative.     Labs/Other Tests and Data Reviewed:    Recent Labs: 03/11/2018: TSH 1.170 12/28/2018: ALT 56; B Natriuretic Peptide 601.9 12/31/2018: BUN 38; Creatinine, Ser 2.69; Hemoglobin 8.3; Platelets 306; Potassium 3.6; Sodium 135   Recent Lipid Panel Lab Results  Component Value Date/Time   CHOL 191 06/25/2018 10:50 AM   CHOL 157 11/20/2013 09:54 AM   TRIG 104 06/25/2018 10:50 AM   TRIG 114 11/20/2013 09:54 AM   HDL 47 06/25/2018 10:50 AM   HDL 38 (L) 11/20/2013 09:54 AM   LDLCALC 123 (H) 06/25/2018 10:50 AM   LDLCALC 96 11/20/2013 09:54 AM    Wt Readings from Last 3 Encounters:  01/14/19 296 lb (134.3 kg)  12/31/18 292 lb 1.8 oz (132.5 kg)  12/02/18 (!) 306 lb (138.8 kg)     Exam:    Vital Signs: Vital signs may also be detailed in the HPI There were no vitals taken for this visit.  Wt Readings from Last 3 Encounters:  01/14/19 296 lb (134.3 kg)  12/31/18 292 lb 1.8 oz (132.5 kg)  12/02/18 (!) 306 lb (138.8 kg)   Temp Readings from Last 3 Encounters:  02/10/19 98 F (36.7 C)  01/27/19 97.6 F (36.4 C)  01/20/19 98.1 F (36.7 C)   BP Readings from Last 3 Encounters:  02/10/19 132/83  01/27/19 122/78  01/20/19 (!) 160/78   Pulse Readings from Last 3 Encounters:  02/10/19 80  01/27/19 66  01/20/19 66    BP 120/80 Pulse : 60 Resp: 63  Well nourished, well developed female in no acute distress. Constitutional:  oriented to person, place, and time. No distress.  Head: Normocephalic and atraumatic.  Eyes:  no discharge. No scleral icterus.  Neck: Normal range of motion. Neck supple.  Pulmonary/Chest: No audible wheezing, no distress, appears comfortable Musculoskeletal: Normal range of motion.  no  tenderness or deformity.  Neurological:   Coordination normal. Full exam not performed Skin:  No rash Psychiatric:  normal mood and affect. behavior is normal. Thought content normal.    ASSESSMENT & PLAN:    Chronic diastolic CHF  (congestive heart failure) (HCC) Appears euvolemic if not prerenal Elevated BUN and creatinine Was started on torsemide 40 following recent hospitalization prior to that was only on Lasix 20-40 daily Does not make much urine, denies ankle swelling fluid retention abdominal swelling shortness of breath PND orthopnea Suggested she could try alternating torsemide 20 with torsemide 40 Might be able to get down to torsemide 20 daily with extra torsemide for any ankle swelling as she does have low fluid intake daily Unable to stand for daily weights  Essential hypertension Blood pressure is well controlled on today's visit. No changes made to the medications.  Leg edema Denies any ankle swelling Details as above  Chest pain of uncertain etiology No recent chest pain symptoms, no further ischemic work-up  Endocarditis Details above, long hospitalization, slow recovery since February 2020 Completed 6 weeks antibiotics Denies chills fever Reiger's   COVID-19 Education: The signs and symptoms of COVID-19 were discussed with the patient and how to seek care for testing (follow up with PCP or arrange E-visit).  The importance of social distancing was discussed today.  Patient Risk:   After full review of this patients clinical status, I feel that they are at least moderate risk at this time.  Time:   Today, I have spent 60 minutes with the patient with telehealth technology discussing the cardiac and medical problems/diagnoses detailed above   10 min spent reviewing the chart prior to patient visit today Long discussion today concerning endocarditis, treatment, monitoring, discussed renal failure, management Discussed management of heart failure, fluid, salt intake, fluid restriction, daily weights, management of diuretics   Medication Adjustments/Labs and Tests Ordered: Current medicines are reviewed at length with the patient today.  Concerns regarding medicines are outlined above.    Tests Ordered: No tests ordered   Medication Changes: No changes made   Disposition: Follow-up in 6 months   Signed, Ida Rogue, MD  03/04/2019 9:49 AM    [place] Office [address], [place], [place] [address]

## 2019-03-04 ENCOUNTER — Encounter: Payer: Self-pay | Admitting: Infectious Diseases

## 2019-03-04 ENCOUNTER — Ambulatory Visit (INDEPENDENT_AMBULATORY_CARE_PROVIDER_SITE_OTHER): Payer: Medicare HMO | Admitting: Infectious Diseases

## 2019-03-04 ENCOUNTER — Telehealth (INDEPENDENT_AMBULATORY_CARE_PROVIDER_SITE_OTHER): Payer: Medicare HMO | Admitting: Cardiovascular Disease

## 2019-03-04 ENCOUNTER — Other Ambulatory Visit: Payer: Self-pay

## 2019-03-04 DIAGNOSIS — R0789 Other chest pain: Secondary | ICD-10-CM

## 2019-03-04 DIAGNOSIS — N184 Chronic kidney disease, stage 4 (severe): Secondary | ICD-10-CM

## 2019-03-04 DIAGNOSIS — I5032 Chronic diastolic (congestive) heart failure: Secondary | ICD-10-CM

## 2019-03-04 DIAGNOSIS — I330 Acute and subacute infective endocarditis: Secondary | ICD-10-CM | POA: Diagnosis not present

## 2019-03-04 DIAGNOSIS — B955 Unspecified streptococcus as the cause of diseases classified elsewhere: Secondary | ICD-10-CM | POA: Diagnosis not present

## 2019-03-04 DIAGNOSIS — R600 Localized edema: Secondary | ICD-10-CM

## 2019-03-04 DIAGNOSIS — I10 Essential (primary) hypertension: Secondary | ICD-10-CM

## 2019-03-04 DIAGNOSIS — I358 Other nonrheumatic aortic valve disorders: Secondary | ICD-10-CM

## 2019-03-04 DIAGNOSIS — R7881 Bacteremia: Secondary | ICD-10-CM | POA: Diagnosis not present

## 2019-03-04 DIAGNOSIS — R079 Chest pain, unspecified: Secondary | ICD-10-CM

## 2019-03-04 MED ORDER — TORSEMIDE 20 MG PO TABS: 20.0000 mg | ORAL_TABLET | Freq: Every day | ORAL | Status: DC

## 2019-03-04 NOTE — Patient Instructions (Signed)
It was very nice to talk to you on the phone today.  I am glad to hear that you are staying well and keeping healthy.  I am confident that we have cured you from your infection based on her discussion today and the trend from your lab work while you are on antibiotics.  We treated you for streptococcal endocarditis (heart valve infection).  Things to look out for would be fevers, chills, ongoing fatigue otherwise unexplained, weight loss.  While these can be indicative of other things early on since stopping your antibiotics I would worry about looking for this infection again.   Please continue to follow with your urology and nephrology team as well as your primary care provider.  No further antibiotics are indicated at this time.  Follow-up as needed with infectious disease team. Please continue to be well, stay away from folks that are sick, wash her hands frequently, do not touch her face or mouth and continue to take your medications.

## 2019-03-04 NOTE — Patient Instructions (Addendum)
Medication Instructions:  Please decrease the torsemide down to 20 mg once a day with extra torsemide 20 mg in the morning as needed for ankle, SOB  If you need a refill on your cardiac medications before your next appointment, please call your pharmacy.    Lab work: No new labs needed   If you have labs (blood work) drawn today and your tests are completely normal, you will receive your results only by: [name] MyChart Message (if you have MyChart) OR . A paper copy in the mail If you have any lab test that is abnormal or we need to change your treatment, we will call you to review the results.   Testing/Procedures: No new testing needed   Follow-Up: At [facility], you and your health needs are our priority.  As part of our continuing mission to provide you with exceptional heart care, we have created designated Provider Care Teams.  These Care Teams include your primary Cardiologist (physician) and Advanced Practice Providers (APPs -  Physician Assistants and Nurse Practitioners) who all work together to provide you with the care you need, when you need it.  . You will need a follow up appointment in 6 months .   Please call our office 2 months in advance to schedule this appointment.    . Providers on your designated Care Team:   . Murray Hodgkins, NP . Christell Faith, PA-C . Marrianne Mood, PA-C  Any Other Special Instructions Will Be Listed Below (If Applicable).  For educational health videos Log in to : [email or web address] Or : [email or web address], password : [email or web address]

## 2019-03-04 NOTE — Assessment & Plan Note (Signed)
She has been off antibiotics for over a month now.  She sounds to be doing very well since stopping and has had no signs of recurrent infection since completion of her 6-week course of ceftriaxone.  Line has been removed.  She has follow-up with cardiology.  I believe her infection has been eradicated with adequate treatment.  Would benefit from repeat echo in the next 3 months to reevaluate aortic valve lesion and function.  She can follow-up with infectious disease as needed.

## 2019-03-04 NOTE — Assessment & Plan Note (Signed)
Discussion with Rachel Holmes reveals no concerning signs for relapsing bacteremia.  This issue has been resolved treating her underlying cellulitis and aortic valve endocarditis for prolonged period of time.

## 2019-03-04 NOTE — Progress Notes (Signed)
Name: Rachel Holmes  YQM:[id number]   DOB: 11/06/1960   PCP: Sharyne Peach, MD   Virtual Visit via Telephone Note  I connected with Rondel Jumbo on 03/04/19 at  2:45 PM EDT by telephone and verified that I am speaking with the correct person using two identifiers.   I discussed the limitations, risks, security and privacy concerns of performing an evaluation and management service by telephone and the availability of in person appointments. I also discussed with the patient that there may be a patient responsible charge related to this service. The patient expressed understanding and agreed to proceed.   Chief Complaint  Patient presents with  . Follow-up    endocarditis. no complaints.      History of Present Illness: Rachel Holmes is a 58 y.o.   Had an appointment with her nephrologist and her kidneys are getting worse. Mentioned that she will likely need dialysis in the future.  Feels that this recent streptococcal infection caused this to acutely worsen.  Had an appointment with her heart doctor - changed her Lasix to torsemide to help get the diuresis she needs.  She denies any shortness of breath.  Does have intermittent leg swelling that comes and goes.  She reports normal amount of urine production, maybe a little bit more since switching to torsemide  She is in the wheelchair at home most of the time as she is afraid of falling and hitting her foot. She reports this continues to be closed up and well healed. She is awaiting an orthopedic/fracture boot to help protect.   Has noticed that she has had some decreased appetite.  Clothes are fitting a little bit looser.  She does not formally weigh herself as she is still in the wheelchair at home and cannot stand for long periods of time.  Medical/surgical/social/family history have been updated during today's visit.     Observations/Objective: Ailah sounds to be in good spirits over the phone. Frustrated going  back and forth between doctors.  I don't appreciate any shortness of breath.    Assessment and Plan: Problem List Items Addressed This Visit      Unprioritized   Aortic valve endocarditis    She has been off antibiotics for over a month now.  She sounds to be doing very well since stopping and has had no signs of recurrent infection since completion of her 6-week course of ceftriaxone.  Line has been removed.  She has follow-up with cardiology.  I believe her infection has been eradicated with adequate treatment.  Would benefit from repeat echo in the next 3 months to reevaluate aortic valve lesion and function.  She can follow-up with infectious disease as needed.      Bacteremia due to Streptococcus    Discussion with Ms. Stall reveals no concerning signs for relapsing bacteremia.  This issue has been resolved treating her underlying cellulitis and aortic valve endocarditis for prolonged period of time.      Stage 4 chronic kidney disease ([place])    I do think that recent streptococcal bacteremia, burden of infection and underlying pre-existing kidney disease has all taken its toll on her kidneys.  It sounds like unfortunately 1 day she will require dialysis.          Follow Up Instructions: Continue to follow with cardiology, nephrology, podiatry, primary care team. She can follow-up with infectious disease as needed for now.   I discussed the assessment and treatment plan with the  patient. The patient was provided an opportunity to ask questions and all were answered. The patient agreed with the plan and demonstrated an understanding of the instructions.   The patient was advised to call back or seek an in-person evaluation if the symptoms worsen or if the condition fails to improve as anticipated.  I provided 12 minutes of non-face-to-face time during this encounter.   Janene Madeira, MSN, NP-C [facility] [email or web address] Pager: [phone] Office: [place]: [phone]

## 2019-03-04 NOTE — Assessment & Plan Note (Signed)
I do think that recent streptococcal bacteremia, burden of infection and underlying pre-existing kidney disease has all taken its toll on her kidneys.  It sounds like unfortunately 1 day she will require dialysis.

## 2019-03-09 ENCOUNTER — Telehealth: Payer: Self-pay | Admitting: Emergency Medicine

## 2019-03-09 NOTE — Telephone Encounter (Signed)
Copied from [place] [phone]. Topic: Quick Communication - See Telephone Encounter >> Mar 09, 2019 12:29 PM Vernona Rieger wrote: CRM for notification. See Telephone encounter for: 03/09/19.  Patient said Dr Tamala Julian was going to follow up with her about a gel shot for her knee. Please Advise [phone]

## 2019-03-15 ENCOUNTER — Other Ambulatory Visit: Payer: Self-pay

## 2019-03-15 ENCOUNTER — Telehealth: Payer: Self-pay | Admitting: Endocrinology

## 2019-03-15 MED ORDER — INSULIN PEN NEEDLE 32G X 6 MM MISC: 1.0000 | Freq: Three times a day (TID) | 3 refills | Status: DC

## 2019-03-15 MED ORDER — INSULIN ASPART 100 UNIT/ML FLEXPEN: PEN_INJECTOR | SUBCUTANEOUS | 11 refills | Status: DC

## 2019-03-15 NOTE — Telephone Encounter (Signed)
E-Prescribing Status: Receipt confirmed by pharmacy (03/15/2019 10:49 AM EDT)

## 2019-03-15 NOTE — Telephone Encounter (Signed)
MEDICATION: insulin aspart (NOVOLOG FLEXPEN) 100 UNIT/ML FlexPen  PHARMACY:  [facility] ([facility] [place] - [address]  IS THIS A 90 DAY SUPPLY :   IS PATIENT OUT OF MEDICATION:   IF NOT; HOW MUCH IS LEFT:   LAST APPOINTMENT DATE: @4 /17/2020  NEXT APPOINTMENT DATE:@5 /22/2020  DO WE HAVE YOUR PERMISSION TO LEAVE A DETAILED MESSAGE:  OTHER COMMENTS:    **Let patient know to contact pharmacy at the end of the day to make sure medication is ready. **  ** Please notify patient to allow 48-72 hours to process**  **Encourage patient to contact the pharmacy for refills or they can request refills through [facility]**

## 2019-03-15 NOTE — Telephone Encounter (Signed)
Patient is calling back. Please advise  

## 2019-03-15 NOTE — Telephone Encounter (Signed)
Spoke to pt. Insurance approved monovisc injection. Pt scheduled 03/17/19 w/ Dr. Tamala Julian.

## 2019-03-17 ENCOUNTER — Other Ambulatory Visit: Payer: Self-pay

## 2019-03-17 ENCOUNTER — Ambulatory Visit (INDEPENDENT_AMBULATORY_CARE_PROVIDER_SITE_OTHER): Payer: Medicare HMO | Admitting: Family Medicine

## 2019-03-17 ENCOUNTER — Encounter: Payer: Self-pay | Admitting: Family Medicine

## 2019-03-17 ENCOUNTER — Other Ambulatory Visit: Payer: Self-pay | Admitting: Endocrinology

## 2019-03-17 DIAGNOSIS — M1711 Unilateral primary osteoarthritis, right knee: Secondary | ICD-10-CM | POA: Diagnosis not present

## 2019-03-17 NOTE — Patient Instructions (Signed)
1 

## 2019-03-17 NOTE — Assessment & Plan Note (Addendum)
Viscosupplementation given again today.  At this point I am concerned that patient would not be a candidate for any type of replacement.  Encourage patient to get a custom brace.  I think she will need help with the stability.  Patient is likely looking at a amputation below the knee on the contralateral side.  Understands how this can increasing discomfort.  Patient can have viscosupplementation every 6 months.  Had lengthy consider the possibility of him limiting the amount of steroid injections.  Spent  25 minutes with patient face-to-face and had greater than 50% of counseling including as described above in assessment and plan.  We discussed that unfortunately surgical intervention could be a possibility but due to patient's young age as well as other comorbidities this would be something we would want to avoid.  Patient is going to see how the viscosupplementation helps.  We also discussed again at great length of different home exercises, importance of weight loss, and medications.

## 2019-03-17 NOTE — Progress Notes (Signed)
[name] [address] [place], [place] [address] Phone: [phone] Subjective:   Fontaine No, am serving as a scribe for Dr. Hulan Saas.   CC: Right knee pain  OVA:[id number]  Rachel Holmes is a 58 y.o. female coming in with complaint of right knee pain.  Severe arthritic changes of the knee noted.  Contralateral side patient is to get an amputation for a chronic infection.  Patient will need to get her blood sugar under control.  We were discussing the possibility of a knee replacement but this becomes much more difficult.  Failed all conservative therapy.  Has been approved for Visco supplementation.       Past Medical History:  Diagnosis Date  . Allergic rhinitis   . Anemia   . Anxiety   . Back pain   . Bradycardia   . Breast mass    Patient can no longer palpate specific masses but showed tech general area of concern  . CHF (congestive heart failure) ([place])   . CKD (chronic kidney disease)    STAGE 3  . Constipation   . Diabetes mellitus without complication ([place])   . Dyspnea   . GERD (gastroesophageal reflux disease)   . HLD (hyperlipidemia)   . Hyperparathyroidism ([place])   . Hypertension   . Joint pain   . Leg edema   . Legally blind in left eye, as defined in [place]   . Lymphedema   . Onychomycosis    Past Surgical History:  Procedure Laterality Date  . ABDOMINAL HYSTERECTOMY    . BREAST BIOPSY Left 2014   FNA 12:00 position - Negative  . EYE SURGERY    . IR FLUORO GUIDE CV LINE RIGHT  12/29/2018  . IR REMOVAL TUN CV CATH W/O FL  02/19/2019  . IR US GUIDE VASC ACCESS RIGHT  12/29/2018  . TEE WITHOUT CARDIOVERSION N/A 12/28/2018   Procedure: TRANSESOPHAGEAL ECHOCARDIOGRAM (TEE);  Surgeon: Sueanne Margarita, MD;  Location: [facility] ENDOSCOPY;  Service: Cardiovascular;  Laterality: N/A;   Social History   Socioeconomic History  . Marital status: Divorced    Spouse name: Not on file  . Number of children: Not on file  . Years  of education: Not on file  . Highest education level: Not on file  Occupational History  . Occupation: Glass blower/designer  Social Needs  . Financial resource strain: Not on file  . Food insecurity:    Worry: Not on file    Inability: Not on file  . Transportation needs:    Medical: Not on file    Non-medical: Not on file  Tobacco Use  . Smoking status: Never Smoker  . Smokeless tobacco: Never Used  Substance and Sexual Activity  . Alcohol use: No  . Drug use: Never  . Sexual activity: Not on file  Lifestyle  . Physical activity:    Days per week: Not on file    Minutes per session: Not on file  . Stress: Not on file  Relationships  . Social connections:    Talks on phone: Not on file    Gets together: Not on file    Attends religious service: Not on file    Active member of club or organization: Not on file    Attends meetings of clubs or organizations: Not on file    Relationship status: Not on file  Other Topics Concern  . Not on file  Social History Narrative   ** Merged  History Encounter **       Allergies  Allergen Reactions  . Statins Shortness Of Breath    Wheezing, short of breath  . Codeine Nausea Only  . Ibuprofen Other (See Comments)    Reaction:  Raises pts BP  . Penicillins Hives and Other (See Comments)    Has patient had a PCN reaction causing immediate rash, facial/tongue/throat swelling, SOB or lightheadedness with hypotension: No Has patient had a PCN reaction causing severe rash involving mucus membranes or skin necrosis: No Has patient had a PCN reaction that required hospitalization No Has patient had a PCN reaction occurring within the last 10 years: No If all of the above answers are "NO", then may proceed with Cephalosporin use.  [name] Percocet [Oxycodone-Acetaminophen] Nausea Only  . Tramadol Nausea Only    Can take if she has eaten  . Vicodin [Hydrocodone-Acetaminophen] Nausea Only   Family History  Problem Relation Age of Onset  . Breast  cancer Sister 66  . Diabetes Sister   . Diabetes Mother   . Hypertension Mother   . Hyperlipidemia Mother   . Eating disorder Mother   . Obesity Mother     Current Outpatient Medications (Endocrine & Metabolic):  [name]  Dulaglutide (TRULICITY) 1.5 LK/5.6YB SOPN, Inject 1.5 mg into the skin once a week. (Patient taking differently: Inject 1.5 mg into the skin once a week. sunday) .  insulin aspart (NOVOLOG FLEXPEN) 100 UNIT/ML FlexPen, 3 times a day (just before each meal) 08-18-11 units.  Current Outpatient Medications (Cardiovascular):  .  amLODipine (NORVASC) 10 MG tablet, Take 10 mg by mouth daily. .  cloNIDine (CATAPRES) 0.3 MG tablet, Take 0.3 mg by mouth 2 (two) times daily. .  isosorbide mononitrate (IMDUR) 120 MG 24 hr tablet, Take 120 mg by mouth daily. [name]  torsemide (DEMADEX) 20 MG tablet, Take 1 tablet (20 mg total) by mouth daily. Take an extra 88m tablet daily prn in the morning for swelling or sob.  Current Outpatient Medications (Respiratory):  .  albuterol (PROVENTIL HFA;VENTOLIN HFA) 108 (90 Base) MCG/ACT inhaler, Inhale 1-2 puffs into the lungs every 6 (six) hours as needed for wheezing or shortness of breath.  Current Outpatient Medications (Analgesics):  .  aspirin EC 325 MG tablet, Take 325 mg by mouth daily. .  traMADol (ULTRAM) 50 MG tablet, Take 50 mg by mouth every 6 (six) hours as needed for moderate pain or severe pain (Pain). Take with food   Current Outpatient Medications (Other):  .  Blood Glucose Monitoring Suppl (ONE TOUCH ULTRA 2) w/Device KIT, 1 Device by Does not apply route daily. .[name] ceFAZolin (ANCEF) IVPB, Inject 2 g into the vein every 12 (twelve) hours. Indication:  Endocarditis Last Day of Therapy:  02/11/19 Labs - Once weekly:  CBC/D and BMP Labs - Every other week:  ESR and CRP .  DULoxetine (CYMBALTA) 30 MG capsule, Take 30 mg by mouth daily.  .[name] gabapentin (NEURONTIN) 600 MG tablet, Take 600 mg by mouth at bedtime. .  Insulin Pen Needle (BD PEN  NEEDLE MICRO U/F) 32G X 6 MM MISC, 1 each by Does not apply route 3 (three) times daily. .  Lancets (ONETOUCH ULTRASOFT) lancets, Used to check blood sugars four times daily. .  Multiple Vitamin (MULTIVITAMIN WITH MINERALS) TABS tablet, Take 1 tablet by mouth daily. .[name] omeprazole (PRILOSEC) 40 MG capsule, Take 40 mg by mouth 2 (two) times daily. .  ONE TOUCH ULTRA TEST test strip, USE 1 STRIP  TO CHECK GLUCOSE THREE TIMES DAILY .  sodium bicarbonate 650 MG tablet, Take 1 tablet (650 mg total) by mouth 2 (two) times daily. .  Vitamin D, Ergocalciferol, (DRISDOL) 1.25 MG (50000 UT) CAPS capsule, Take 1 capsule (50,000 Units total) by mouth every 7 (seven) days.    Past medical history, social, surgical and family history all reviewed in electronic medical record.  No pertanent information unless stated regarding to the chief complaint.   Review of Systems:  No headache, visual changes, nausea, vomiting, diarrhea, constipation, dizziness, abdominal pain, skin rash, fevers, chills, night sweats, weight loss, swollen lymph nodes,  chest pain, shortness of breath, mood changes.  Positive muscle aches, joint swelling, body aches, lower extremity swelling  Objective  Blood pressure 136/84, pulse 65, height _0  (1.676 m), weight 296 lb (134.3 kg), SpO2 98 %.     General: No apparent distress alert and oriented x3 mood and affect normal, dressed appropriately.  HEENT: Pupils equal, extraocular movements intact  Respiratory: Patient's speak in full sentences and does not appear short of breath wearing a mask  Skin: Warm dry intact with no signs of infection or rash on extremities or on axial skeleton.  Abdomen: Soft nontender  Neuro: Cranial nerves II through XII are intact, neurovascularly intact in all extremities with 2+ DTRs and 2+ pulses.  Lymph: No lymphadenopathy of posterior or anterior cervical chain or axillae bilaterally.  Gait sitting in a wheelchair MSK:  Knee: Right valgus  deformity noted. Large thigh to calf ratio.  Tender to palpation over medial and PF joint line.  ROM full in flexion and extension and lower leg rotation. instability with valgus force.  painful patellar compression. Patellar glide with moderate crepitus. Patellar and quadriceps tendons unremarkable. Hamstring and quadriceps strength is normal. Contralateral knee shows arthritic changes but patient is in a long cam walker and did not do further evaluation  After informed written and verbal consent, patient was seated on exam table. Right knee was prepped with alcohol swab and utilizing anterolateral approach, patient's right knee space was injected with22 mg/1 mL of Monovisc (sodium hyaluronate) in a prefilled syringe was injected easily into the knee through a 22-gauge needle..Patient tolerated the procedure well without immediate complications.    Impression and Recommendations:     This case required medical decision making of moderate complexity. The above documentation has been reviewed and is accurate and complete Lyndal Pulley, DO       Note: This dictation was prepared with Dragon dictation along with smaller phrase technology. Any transcriptional errors that result from this process are unintentional.

## 2019-03-22 ENCOUNTER — Telehealth: Payer: Self-pay | Admitting: Endocrinology

## 2019-03-22 ENCOUNTER — Other Ambulatory Visit: Payer: Self-pay

## 2019-03-22 DIAGNOSIS — E1165 Type 2 diabetes mellitus with hyperglycemia: Secondary | ICD-10-CM

## 2019-03-22 DIAGNOSIS — IMO0002 Reserved for concepts with insufficient information to code with codable children: Secondary | ICD-10-CM

## 2019-03-22 DIAGNOSIS — E1142 Type 2 diabetes mellitus with diabetic polyneuropathy: Secondary | ICD-10-CM

## 2019-03-22 MED ORDER — GLUCOSE BLOOD VI STRP: 1.0000 | ORAL_STRIP | Freq: Two times a day (BID) | 3 refills | Status: DC

## 2019-03-22 NOTE — Telephone Encounter (Signed)
glucose blood (ONETOUCH VERIO) test strip 100 each 3 03/22/2019    Sig - Route: 1 each by Other route 2 (two) times a day. Use to monitor glucose levels BID; E11.42 - Other   Sent to pharmacy as: glucose blood (ONETOUCH VERIO) test strip   E-Prescribing Status: Receipt confirmed by pharmacy (03/22/2019 11:11 AM EDT)

## 2019-03-22 NOTE — Telephone Encounter (Signed)
[facility] ph# [phone] called re: requests new RX for OneTouch Verio Test Strips

## 2019-03-25 ENCOUNTER — Ambulatory Visit: Payer: Medicare HMO | Admitting: Family Medicine

## 2019-03-25 ENCOUNTER — Encounter: Payer: Self-pay | Admitting: Endocrinology

## 2019-03-26 ENCOUNTER — Ambulatory Visit (INDEPENDENT_AMBULATORY_CARE_PROVIDER_SITE_OTHER): Payer: Medicare HMO | Admitting: Endocrinology

## 2019-03-26 ENCOUNTER — Other Ambulatory Visit: Payer: Self-pay

## 2019-03-26 DIAGNOSIS — E1142 Type 2 diabetes mellitus with diabetic polyneuropathy: Secondary | ICD-10-CM

## 2019-03-26 DIAGNOSIS — E1165 Type 2 diabetes mellitus with hyperglycemia: Secondary | ICD-10-CM | POA: Diagnosis not present

## 2019-03-26 DIAGNOSIS — IMO0002 Reserved for concepts with insufficient information to code with codable children: Secondary | ICD-10-CM

## 2019-03-26 MED ORDER — INSULIN ASPART 100 UNIT/ML FLEXPEN: PEN_INJECTOR | SUBCUTANEOUS | 11 refills | Status: DC

## 2019-03-26 NOTE — Progress Notes (Signed)
Subjective:    Patient ID: Rachel Holmes, female    DOB: Mar 24, 1961, 58 y.o.   MRN: [id number]  HPI telehealth visit today via phone x 7 minutes Alternatives to telehealth are presented to this patient, and the patient agrees to the telehealth visit. Pt is advised of the cost of the visit, and agrees to this, also.   Patient is at home, and I am at the office.   Pt returns for f/u of diabetes mellitus: DM type: Insulin-requiring type 2 Dx'ed: 1988, during a pregnancy, but it persisted after Complications: polyneuropathy, renal failure, leg ulcer, Charcot foot, and PDR.   Therapy: insulin since 1093, and trulicity DKA: never Severe hypoglycemia: 2 episodes (both many years ago).   Pancreatitis: never Pancreatic imaging: normal on 2008 [name].   Other: a trial off insulin in early 2020 was unsuccessful; renal failure limits rx options; she does not need basal insulin.   Interval history: pt states cbg's vary from 70-304.  It is lowest after breakfast, and highest in the afternoon.  She says she never misses the insulin.    Past Medical History:  Diagnosis Date  . Allergic rhinitis   . Anemia   . Anxiety   . Back pain   . Bradycardia   . Breast mass    Patient can no longer palpate specific masses but showed tech general area of concern  . CHF (congestive heart failure) ([place])   . CKD (chronic kidney disease)    STAGE 3  . Constipation   . Diabetes mellitus [place] complication ([place])   . Dyspnea   . GERD (gastroesophageal reflux disease)   . HLD (hyperlipidemia)   . Hyperparathyroidism ([place])   . Hypertension   . Joint pain   . Leg edema   . Legally blind in left eye, as defined in [place]   . Lymphedema   . Onychomycosis     Past Surgical History:  Procedure Laterality Date  . ABDOMINAL HYSTERECTOMY    . BREAST BIOPSY Left 2014   FNA 12:00 position - Negative  . EYE SURGERY    . IR FLUORO GUIDE CV LINE RIGHT  12/29/2018  . IR REMOVAL TUN CV CATH W/O FL  02/19/2019  . IR  US GUIDE VASC ACCESS RIGHT  12/29/2018  . TEE WITHOUT CARDIOVERSION N/A 12/28/2018   Procedure: TRANSESOPHAGEAL ECHOCARDIOGRAM (TEE);  Surgeon: Sueanne Margarita, MD;  Location: [facility] ENDOSCOPY;  Service: Cardiovascular;  Laterality: N/A;    Social History   Socioeconomic History  . Marital status: Divorced    Spouse name: Not on file  . Number of children: Not on file  . Years of education: Not on file  . Highest education level: Not on file  Occupational History  . Occupation: Glass blower/designer  Social Needs  . Financial resource strain: Not on file  . Food insecurity:    Worry: Not on file    Inability: Not on file  . Transportation needs:    Medical: Not on file    Non-medical: Not on file  Tobacco Use  . Smoking status: Never Smoker  . Smokeless tobacco: Never Used  Substance and Sexual Activity  . Alcohol use: No  . Drug use: Never  . Sexual activity: Not on file  Lifestyle  . Physical activity:    Days per week: Not on file    Minutes per session: Not on file  . Stress: Not on file  Relationships  . Social connections:    Talks on  phone: Not on file    Gets together: Not on file    Attends religious service: Not on file    Active member of club or organization: Not on file    Attends meetings of clubs or organizations: Not on file    Relationship status: Not on file  . Intimate partner violence:    Fear of current or ex partner: Not on file    Emotionally abused: Not on file    Physically abused: Not on file    Forced sexual activity: Not on file  Other Topics Concern  . Not on file  Social History Narrative   ** Merged History Encounter **        Current Outpatient Medications on File Prior to Visit  Medication Sig Dispense Refill  . albuterol (PROVENTIL HFA;VENTOLIN HFA) 108 (90 Base) MCG/ACT inhaler Inhale 1-2 puffs into the lungs every 6 (six) hours as needed for wheezing or shortness of breath.    [name] amLODipine (NORVASC) 10 MG tablet Take 10 mg by mouth  daily.    [name] aspirin EC 325 MG tablet Take 325 mg by mouth daily.    . Blood Glucose Monitoring Suppl (ONE TOUCH ULTRA 2) w/Device KIT 1 Device by Does not apply route daily. 1 each 0  . ceFAZolin (ANCEF) IVPB Inject 2 g into the vein every 12 (twelve) hours. Indication:  Endocarditis Last Day of Therapy:  02/11/19 Labs - Once weekly:  CBC/D and BMP Labs - Every other week:  ESR and CRP 84 Units 0  . cloNIDine (CATAPRES) 0.3 MG tablet Take 0.3 mg by mouth 2 (two) times daily.    . Dulaglutide (TRULICITY) 1.5 BH/4.1PF SOPN Inject 1.5 mg into the skin once a week. (Patient taking differently: Inject 1.5 mg into the skin once a week. sunday) 4 pen 11  . DULoxetine (CYMBALTA) 30 MG capsule Take 30 mg by mouth daily.     [name] gabapentin (NEURONTIN) 600 MG tablet Take 600 mg by mouth at bedtime.    [name] glucose blood (ONETOUCH VERIO) test strip 1 each by Other route 2 (two) times a day. Use to monitor glucose levels BID; E11.42 100 each 3  . Insulin Pen Needle (BD PEN NEEDLE MICRO U/F) 32G X 6 MM MISC 1 each by Does not apply route 3 (three) times daily. 100 each 3  . isosorbide mononitrate (IMDUR) 120 MG 24 hr tablet Take 120 mg by mouth daily.    . Lancets (ONETOUCH ULTRASOFT) lancets Used to check blood sugars four times daily. 200 each 12  . Multiple Vitamin (MULTIVITAMIN WITH MINERALS) TABS tablet Take 1 tablet by mouth daily.    [name] omeprazole (PRILOSEC) 40 MG capsule Take 40 mg by mouth 2 (two) times daily.    . sodium bicarbonate 650 MG tablet Take 1 tablet (650 mg total) by mouth 2 (two) times daily. 60 tablet 0  . torsemide (DEMADEX) 20 MG tablet Take 1 tablet (20 mg total) by mouth daily. Take an extra 25m tablet daily prn in the morning for swelling or sob.    . traMADol (ULTRAM) 50 MG tablet Take 50 mg by mouth every 6 (six) hours as needed for moderate pain or severe pain (Pain). Take with food    . Vitamin D, Ergocalciferol, (DRISDOL) 1.25 MG (50000 UT) CAPS capsule Take 1 capsule (50,000 Units  total) by mouth every 7 (seven) days. 4 capsule 0   No current facility-administered medications on file prior to visit.  Allergies  Allergen Reactions  . Statins Shortness Of Breath    Wheezing, short of breath  . Codeine Nausea Only  . Ibuprofen Other (See Comments)    Reaction:  Raises pts BP  . Penicillins Hives and Other (See Comments)    Has patient had a PCN reaction causing immediate rash, facial/tongue/throat swelling, SOB or lightheadedness with hypotension: No Has patient had a PCN reaction causing severe rash involving mucus membranes or skin necrosis: No Has patient had a PCN reaction that required hospitalization No Has patient had a PCN reaction occurring within the last 10 years: No If all of the above answers are "NO", then may proceed with Cephalosporin use.  [name] Percocet [Oxycodone-Acetaminophen] Nausea Only  . Tramadol Nausea Only    Can take if she has eaten  . Vicodin [Hydrocodone-Acetaminophen] Nausea Only    Family History  Problem Relation Age of Onset  . Breast cancer Sister 50  . Diabetes Sister   . Diabetes Mother   . Hypertension Mother   . Hyperlipidemia Mother   . Eating disorder Mother   . Obesity Mother      Review of Systems She denies hypoglycemia    Objective:   Physical Exam      Assessment & Plan:  Insulin-requiring type 2 DM, with renal failure: we discussed.  she declines a1c now.  Based on the pattern of her cbg's, she needs some adjustment in her therapy.    Patient Instructions  Please change the Novolog to 3 times a day (just before each meal), 06-20-11 units, and:  Please continue the same Trulicity. These instructions are confirmed by readback.     check your blood sugar twice a day.  vary the time of day when you check, between before the 3 meals, and at bedtime.  also check if you have symptoms of your blood sugar being too high or too low.  please keep a record of the readings and bring it to your next appointment  here (or you can bring the meter itself).  You can write it on any piece of paper.  please call us sooner if your blood sugar goes below 70, or if you have a lot of readings over 200.  Please call or message [name] next week, to tell us how the blood sugar is doing.   Please come back for a follow-up appointment in 4-6 weeks.

## 2019-03-26 NOTE — Patient Instructions (Signed)
Please change the Novolog to 3 times a day (just before each meal), 06-20-11 units, and:  Please continue the same Trulicity. These instructions are confirmed by readback.     check your blood sugar twice a day.  vary the time of day when you check, between before the 3 meals, and at bedtime.  also check if you have symptoms of your blood sugar being too high or too low.  please keep a record of the readings and bring it to your next appointment here (or you can bring the meter itself).  You can write it on any piece of paper.  please call us sooner if your blood sugar goes below 70, or if you have a lot of readings over 200.  Please call or message [name] next week, to tell us how the blood sugar is doing.   Please come back for a follow-up appointment in 4-6 weeks.

## 2019-04-01 ENCOUNTER — Other Ambulatory Visit: Payer: Self-pay | Admitting: Family Medicine

## 2019-04-01 DIAGNOSIS — Z1231 Encounter for screening mammogram for malignant neoplasm of breast: Secondary | ICD-10-CM

## 2019-04-12 ENCOUNTER — Ambulatory Visit (INDEPENDENT_AMBULATORY_CARE_PROVIDER_SITE_OTHER): Payer: Medicare HMO | Admitting: Vascular Surgery

## 2019-04-12 ENCOUNTER — Encounter (INDEPENDENT_AMBULATORY_CARE_PROVIDER_SITE_OTHER): Payer: Self-pay | Admitting: Vascular Surgery

## 2019-04-12 ENCOUNTER — Other Ambulatory Visit: Payer: Self-pay

## 2019-04-12 VITALS — BP 145/83 | HR 64 | Resp 14 | Ht 68.0 in | Wt 290.0 lb

## 2019-04-12 DIAGNOSIS — K219 Gastro-esophageal reflux disease without esophagitis: Secondary | ICD-10-CM

## 2019-04-12 DIAGNOSIS — I509 Heart failure, unspecified: Secondary | ICD-10-CM | POA: Diagnosis not present

## 2019-04-12 DIAGNOSIS — M14672 Charcot's joint, left ankle and foot: Secondary | ICD-10-CM

## 2019-04-12 DIAGNOSIS — L97324 Non-pressure chronic ulcer of left ankle with necrosis of bone: Secondary | ICD-10-CM

## 2019-04-12 DIAGNOSIS — Z79899 Other long term (current) drug therapy: Secondary | ICD-10-CM

## 2019-04-12 DIAGNOSIS — I10 Essential (primary) hypertension: Secondary | ICD-10-CM | POA: Diagnosis not present

## 2019-04-13 ENCOUNTER — Encounter (INDEPENDENT_AMBULATORY_CARE_PROVIDER_SITE_OTHER): Payer: Self-pay | Admitting: Nurse Practitioner

## 2019-04-13 ENCOUNTER — Ambulatory Visit (INDEPENDENT_AMBULATORY_CARE_PROVIDER_SITE_OTHER): Payer: Medicare HMO | Admitting: Nurse Practitioner

## 2019-04-13 ENCOUNTER — Other Ambulatory Visit (INDEPENDENT_AMBULATORY_CARE_PROVIDER_SITE_OTHER): Payer: Self-pay | Admitting: Vascular Surgery

## 2019-04-13 ENCOUNTER — Other Ambulatory Visit: Payer: Self-pay

## 2019-04-13 ENCOUNTER — Ambulatory Visit (INDEPENDENT_AMBULATORY_CARE_PROVIDER_SITE_OTHER): Payer: Medicare HMO

## 2019-04-13 VITALS — BP 157/72 | HR 65 | Resp 12 | Ht 67.0 in | Wt 290.0 lb

## 2019-04-13 DIAGNOSIS — L97324 Non-pressure chronic ulcer of left ankle with necrosis of bone: Secondary | ICD-10-CM

## 2019-04-13 DIAGNOSIS — M1711 Unilateral primary osteoarthritis, right knee: Secondary | ICD-10-CM

## 2019-04-13 DIAGNOSIS — I739 Peripheral vascular disease, unspecified: Secondary | ICD-10-CM

## 2019-04-13 DIAGNOSIS — M79606 Pain in leg, unspecified: Secondary | ICD-10-CM | POA: Diagnosis not present

## 2019-04-13 DIAGNOSIS — I10 Essential (primary) hypertension: Secondary | ICD-10-CM | POA: Diagnosis not present

## 2019-04-13 DIAGNOSIS — Z79899 Other long term (current) drug therapy: Secondary | ICD-10-CM

## 2019-04-13 DIAGNOSIS — K219 Gastro-esophageal reflux disease without esophagitis: Secondary | ICD-10-CM

## 2019-04-13 DIAGNOSIS — Z791 Long term (current) use of non-steroidal anti-inflammatories (NSAID): Secondary | ICD-10-CM

## 2019-04-14 ENCOUNTER — Encounter (INDEPENDENT_AMBULATORY_CARE_PROVIDER_SITE_OTHER): Payer: Self-pay | Admitting: Vascular Surgery

## 2019-04-14 DIAGNOSIS — L97329 Non-pressure chronic ulcer of left ankle with unspecified severity: Secondary | ICD-10-CM | POA: Insufficient documentation

## 2019-04-14 NOTE — Progress Notes (Signed)
MRN : [id number]  Rachel Holmes is a 58 y.o. (1960/11/30) female who presents with chief complaint of  Chief Complaint  Patient presents with  . New Patient (Initial Visit)  .  History of Present Illness:   Patient is seen for evaluation of leg pain and swelling associated with new onset ulceration. The patient first noticed the swelling remotely. The swelling is associated with pain and discoloration. The pain and swelling worsens with prolonged dependency and improves with elevation. The pain is unrelated to activity.  The patient notes that in the morning the legs are better but the leg symptoms worsened throughout the course of the day. The patient has also noted a progressive worsening of the discoloration in the ankle and shin area.   The patient notes that an ulcer has developed acutely without specific trauma and since it occurred it has been very slow to heal.  There is a moderate amount of drainage associated with the open area.  The wound is also very painful.  The patient denies claudication symptoms or rest pain symptoms.  The patient denies DJD and LS spine disease.  The patient has not had any past angiography, interventions or vascular surgery.  Elevation makes the leg symptoms better, dependency makes them much worse. The patient denies any recent changes in medications.  The patient has not been wearing graduated compression.  The patient denies a history of DVT or PE. There is no prior history of phlebitis. There is no history of primary lymphedema.  No history of malignancies. No history of trauma or groin or pelvic surgery. There is no history of radiation treatment to the groin or pelvis       Current Meds  Medication Sig  . albuterol (PROVENTIL HFA;VENTOLIN HFA) 108 (90 Base) MCG/ACT inhaler Inhale 1-2 puffs into the lungs every 6 (six) hours as needed for wheezing or shortness of breath.  [name] amLODipine (NORVASC) 10 MG tablet Take 10 mg by mouth daily.   [name] aspirin EC 325 MG tablet Take 325 mg by mouth daily.  [name] azelastine (ASTELIN) 0.1 % nasal spray Place into both nostrils 2 (two) times daily. Use in each nostril as directed  . clobetasol cream (TEMOVATE) 5.46 % Apply 1 application topically 2 (two) times daily.  . cloNIDine (CATAPRES) 0.3 MG tablet Take 0.3 mg by mouth 2 (two) times daily.  . diclofenac sodium (VOLTAREN) 1 % GEL Apply topically 4 (four) times daily.  . Dulaglutide (TRULICITY) 1.5 EV/0.3JK SOPN Inject 1.5 mg into the skin once a week. (Patient taking differently: Inject 1.5 mg into the skin once a week. sunday)  . DULoxetine (CYMBALTA) 30 MG capsule Take 30 mg by mouth daily.   . famotidine (PEPCID) 40 MG tablet Take 40 mg by mouth 2 (two) times daily.  . ferrous sulfate 325 (65 FE) MG tablet Take 325 mg by mouth 3 (three) times daily with meals.  . fluticasone (FLONASE) 50 MCG/ACT nasal spray Place into both nostrils daily.  [name] gabapentin (NEURONTIN) 600 MG tablet Take 600 mg by mouth at bedtime.  . insulin aspart (NOVOLOG FLEXPEN) 100 UNIT/ML FlexPen 3 times a day (just before each meal) 06-20-11 units.  . isosorbide mononitrate (IMDUR) 120 MG 24 hr tablet Take 120 mg by mouth daily.  . Multiple Vitamin (MULTIVITAMIN WITH MINERALS) TABS tablet Take 1 tablet by mouth daily.  [name] omeprazole (PRILOSEC) 40 MG capsule Take 40 mg by mouth 2 (two) times daily.  . ondansetron (ZOFRAN) 4 MG tablet  Take 4 mg by mouth every 8 (eight) hours as needed for nausea or vomiting.  . sodium bicarbonate 650 MG tablet Take 1 tablet (650 mg total) by mouth 2 (two) times daily.  [name] torsemide (DEMADEX) 20 MG tablet Take 1 tablet (20 mg total) by mouth daily. Take an extra 20mg  tablet daily prn in the morning for swelling or sob.  . traMADol (ULTRAM) 50 MG tablet Take 50 mg by mouth every 6 (six) hours as needed for moderate pain or severe pain (Pain). Take with food  . vitamin B-12 (CYANOCOBALAMIN) 1000 MCG tablet Take 1,000 mcg by mouth daily.    Past  Medical History:  Diagnosis Date  . Allergic rhinitis   . Anemia   . Anxiety   . Arthritis   . Back pain   . Bradycardia   . Breast mass    Patient can no longer palpate specific masses but showed tech general area of concern  . CHF (congestive heart failure) ([place])   . CKD (chronic kidney disease)    STAGE 3  . Constipation   . Diabetes mellitus without complication ([place])   . Diabetic nephropathy ([place])   . Dyspnea   . GERD (gastroesophageal reflux disease)   . HLD (hyperlipidemia)   . Hyperparathyroidism ([place])   . Hypertension   . Joint pain   . Leg edema   . Legally blind in left eye, as defined in [place]   . Lymphedema   . Obesity   . Onychomycosis     Past Surgical History:  Procedure Laterality Date  . ABDOMINAL HYSTERECTOMY    . BREAST BIOPSY Left 2014   FNA 12:00 position - Negative  . EYE SURGERY    . IR FLUORO GUIDE CV LINE RIGHT  12/29/2018  . IR REMOVAL TUN CV CATH W/O FL  02/19/2019  . IR US GUIDE VASC ACCESS RIGHT  12/29/2018  . TEE WITHOUT CARDIOVERSION N/A 12/28/2018   Procedure: TRANSESOPHAGEAL ECHOCARDIOGRAM (TEE);  Surgeon: Sueanne Margarita, MD;  Location[facility] ENDOSCOPY;  Service: Cardiovascular;  Laterality: N/A;  . VAGINAL HYSTERECTOMY      Social History Social History   Tobacco Use  . Smoking status: Never Smoker  . Smokeless tobacco: Never Used  Substance Use Topics  . Alcohol use: No  . Drug use: Never    Family History Family History  Problem Relation Age of Onset  . Breast cancer Sister 40  . Diabetes Sister   . Diabetes Mother   . Hypertension Mother   . Hyperlipidemia Mother   . Eating disorder Mother   . Obesity Mother   No family history of bleeding/clotting disorders, porphyria or autoimmune disease   Allergies  Allergen Reactions  . Statins Shortness Of Breath    Wheezing, short of breath  . Codeine Nausea Only  . Ibuprofen Other (See Comments)    Reaction:  Raises pts BP  . Penicillins Hives and Other (See Comments)     Has patient had a PCN reaction causing immediate rash, facial/tongue/throat swelling, SOB or lightheadedness with hypotension: No Has patient had a PCN reaction causing severe rash involving mucus membranes or skin necrosis: No Has patient had a PCN reaction that required hospitalization No Has patient had a PCN reaction occurring within the last 10 years: No If all of the above answers are "NO", then may proceed with Cephalosporin use.  [name] Percocet [Oxycodone-Acetaminophen] Nausea Only  . Tramadol Nausea Only    Can take if she has eaten  .  Vicodin [Hydrocodone-Acetaminophen] Nausea Only     REVIEW OF SYSTEMS (Negative unless checked)  Constitutional: [] Weight loss  [] Fever  [] Chills Cardiac: [] Chest pain   [] Chest pressure   [] Palpitations   [] Shortness of breath when laying flat   [] Shortness of breath with exertion. Vascular:  [] Pain in legs with walking   [] Pain in legs at rest  [] History of DVT   [] Phlebitis   [x] Swelling in legs   [] Varicose veins   [x] Non-healing ulcers Pulmonary:   [] Uses home oxygen   [] Productive cough   [] Hemoptysis   [] Wheeze  [] COPD   [] Asthma Neurologic:  [] Dizziness   [] Seizures   [] History of stroke   [] History of TIA  [] Aphasia   [] Vissual changes   [] Weakness or numbness in arm   [] Weakness or numbness in leg Musculoskeletal:   [x] Joint swelling   [x] Joint pain   [] Low back pain Hematologic:  [] Easy bruising  [] Easy bleeding   [] Hypercoagulable state   [] Anemic Gastrointestinal:  [] Diarrhea   [] Vomiting  [x] Gastroesophageal reflux/heartburn   [] Difficulty swallowing. Genitourinary:  [] Chronic kidney disease   [] Difficult urination  [] Frequent urination   [] Blood in urine Skin:  [] Rashes   [x] Ulcers  Psychological:  [] History of anxiety   []  History of major depression.  Physical Examination  Vitals:   04/12/19 1420  BP: (!) 145/83  Pulse: 64  Resp: 14  Weight: 290 lb (131.5 kg)  Height: 5\' 8"  (1.727 m)   Body mass index is 44.09 kg/m. Gen:  WD/WN, NAD Head: [place]/AT, No temporalis wasting.  Ear/Nose/Throat: Hearing grossly intact, nares w/o erythema or drainage, poor dentition Eyes: PER, EOMI, sclera nonicteric.  Neck: Supple, no masses.  No bruit or JVD.  Pulmonary:  Good air movement, clear to auscultation bilaterally, no use of accessory muscles.  Cardiac: RRR, normal S1, S2, no Murmurs. Vascular: Charcot foot deformity bilateral ankles much more severe on the left.  Over the area of the lateral malleolus there is a 2 to 3 cm in diameter circular wound with bone palpable.  There is no odor there is no drainage does not appear infected at this time. Vessel Right Left  Radial Palpable Palpable  PT Not Palpable Not Palpable  DP Not Palpable Not Palpable  Gastrointestinal: soft, non-distended. No guarding/no peritoneal signs.  Musculoskeletal: M/S 5/5 throughout.  No deformity or atrophy.  Neurologic: CN 2-12 intact. Pain and light touch intact in extremities.  Symmetrical.  Speech is fluent. Motor exam as listed above. Psychiatric: Judgment intact, Mood & affect appropriate for pt's clinical situation. Dermatologic: venous rashes left leg ankle ulcer noted.  No changes consistent with cellulitis. Lymph : No Cervical lymphadenopathy, no lichenification or skin changes of chronic lymphedema.  CBC Lab Results  Component Value Date   WBC 10.6 (H) 12/31/2018   HGB 8.3 (L) 12/31/2018   HCT 26.2 (L) 12/31/2018   MCV 91.0 12/31/2018   PLT 306 12/31/2018    BMET    Component Value Date/Time   NA 135 12/31/2018 0532   NA 139 06/25/2018 1050   NA 137 11/21/2013 0627   K 3.6 12/31/2018 0532   K 4.6 11/21/2013 0627   CL 95 (L) 12/31/2018 0532   CL 107 11/21/2013 0627   CO2 27 12/31/2018 0532   CO2 24 11/21/2013 0627   GLUCOSE 186 (H) 12/31/2018 0532   GLUCOSE 95 11/21/2013 0627   BUN 38 (H) 12/31/2018 0532   BUN 43 (H) 06/25/2018 1050   BUN 34 (H) 11/21/2013 0627   CREATININE 2.69 (  H) 12/31/2018 0532   CREATININE 1.80 (H)  09/09/2014 1220   CALCIUM 8.6 (L) 12/31/2018 0532   CALCIUM 8.8 09/09/2014 1220   GFRNONAA 19 (L) 12/31/2018 0532   GFRNONAA 31 (L) 09/09/2014 1220   GFRNONAA 26 (L) 11/21/2013 0627   GFRAA 22 (L) 12/31/2018 0532   GFRAA 38 (L) 09/09/2014 1220   GFRAA 30 (L) 11/21/2013 0627   CrCl cannot be calculated (Patient's most recent lab result is older than the maximum 21 days allowed.).  COAG No results found for: INR, PROTIME  Radiology No results found.   Assessment/Plan 1. Charcot's joint of left foot The patient has a situation which is not likely to heal properly in spite of surgical fixation.  Because of her profound Charcot foot deformity in association with this large ulcer I concur with Dr. Elvina Mattes and have offered her left below-knee amputation.  Because I cannot feel pedal pulses prior to scheduling the surgery I will check an ABI.  Clinically her foot is warm and I suspect the inability to palpate pulses secondary to the deformity and not to peripheral arterial disease.  I we have also asked permission to contact Mr. Staci Righter who is the prosthetists that I work with and he will make contact with her regarding her future prosthetic.   A total of 65 minutes was spent with this patient and greater than 50% was spent in counseling and coordination of care with the patient.  Discussion included the treatment options for vascular disease including indications for surgery and intervention.  Also discussed is the appropriate timing of treatment.  In addition medical therapy was discussed.  - VAS [name] ABI WITH/WO TBI; Future  2. Skin ulcer of left ankle with necrosis of bone ([place]) See #1 - VAS [name] ABI WITH/WO TBI; Future  3. Essential hypertension Continue antihypertensive medications as already ordered, these medications have been reviewed and there are no changes at this time.   4. Other congestive heart failure (HCC) Continue cardiac and antihypertensive medications as already ordered  and reviewed, no changes at this time.  Continue statin as ordered and reviewed, no changes at this time  Nitrates PRN for chest pain   5. Gastro-esophageal reflux disease without esophagitis Continue PPI as already ordered, this medication has been reviewed and there are no changes at this time.  Avoidence of caffeine and alcohol  Moderate elevation of the head of the bed     [name], MD  04/14/2019 11:33 AM

## 2019-04-16 ENCOUNTER — Telehealth: Payer: Self-pay | Admitting: Cardiovascular Disease

## 2019-04-16 ENCOUNTER — Encounter (INDEPENDENT_AMBULATORY_CARE_PROVIDER_SITE_OTHER): Payer: Self-pay | Admitting: Nurse Practitioner

## 2019-04-16 NOTE — Telephone Encounter (Signed)
Called and spoke with patient about needing appointment here in our office for cardiac clearance and scheduled her to come in on Monday. Confirmed date, time, location, and importance of coming early so that she can get to [name] from [facility]. She verbalized understanding of our conversation, agreement with plan, and had no further questions at this time.       COVID-19 Pre-Screening Questions:  . In the past 7 to 10 days have you had a cough,  shortness of breath, headache, congestion, fever (100 or greater) body aches, chills, sore throat, or sudden loss of taste or sense of smell? No . Have you been around anyone with known Covid 19. No . Have you been around anyone who is awaiting Covid 19 test results in the past 7 to 10 days? No . Have you been around anyone who has been exposed to Covid 19, or has mentioned symptoms of Covid 19 within the past 7 to 10 days? No

## 2019-04-16 NOTE — Progress Notes (Signed)
SUBJECTIVE:  Patient ID: Rachel Holmes, female    DOB: 1960/11/19, 58 y.o.   MRN: [id number] Chief Complaint  Patient presents with  . Follow-up    HPI  Rachel Holmes is a 58 y.o. female presents today for noninvasive studies to evaluate possibility of peripheral artery disease prior to amputation.  The patient has a history of Charcot foot which is now resulted in osteomyelitis of her left lower extremity.  She currently has exposed bone on her left lower extremity.  Currently, the plan is to proceed with left below-knee amputation, pending the results of these noninvasive studies.  Currently the patient denies any fever, chills, nausea, vomiting or diarrhea.  Today the patient has an ABI of 0.53 on the right and 0.96 on the left.  The patient has biphasic tibial artery waveforms on her left lower extremity with monophasic waveforms on her right.  Past Medical History:  Diagnosis Date  . Allergic rhinitis   . Anemia   . Anxiety   . Arthritis   . Back pain   . Bradycardia   . Breast mass    Patient can no longer palpate specific masses but showed tech general area of concern  . CHF (congestive heart failure) ([place])   . CKD (chronic kidney disease)    STAGE 3  . Constipation   . Diabetes mellitus without complication ([place])   . Diabetic nephropathy ([place])   . Dyspnea   . GERD (gastroesophageal reflux disease)   . HLD (hyperlipidemia)   . Hyperparathyroidism ([place])   . Hypertension   . Joint pain   . Leg edema   . Legally blind in left eye, as defined in [place]   . Lymphedema   . Obesity   . Onychomycosis     Past Surgical History:  Procedure Laterality Date  . ABDOMINAL HYSTERECTOMY    . BREAST BIOPSY Left 2014   FNA 12:00 position - Negative  . EYE SURGERY    . IR FLUORO GUIDE CV LINE RIGHT  12/29/2018  . IR REMOVAL TUN CV CATH W/O FL  02/19/2019  . IR US GUIDE VASC ACCESS RIGHT  12/29/2018  . TEE WITHOUT CARDIOVERSION N/A 12/28/2018   Procedure: TRANSESOPHAGEAL  ECHOCARDIOGRAM (TEE);  Surgeon: Sueanne Margarita, MD;  Location[facility] ENDOSCOPY;  Service: Cardiovascular;  Laterality: N/A;  . VAGINAL HYSTERECTOMY      Social History   Socioeconomic History  . Marital status: Divorced    Spouse name: Not on file  . Number of children: Not on file  . Years of education: Not on file  . Highest education level: Not on file  Occupational History  . Occupation: Glass blower/designer  Social Needs  . Financial resource strain: Not on file  . Food insecurity    Worry: Not on file    Inability: Not on file  . Transportation needs    Medical: Not on file    Non-medical: Not on file  Tobacco Use  . Smoking status: Never Smoker  . Smokeless tobacco: Never Used  Substance and Sexual Activity  . Alcohol use: No  . Drug use: Never  . Sexual activity: Not on file  Lifestyle  . Physical activity    Days per week: Not on file    Minutes per session: Not on file  . Stress: Not on file  Relationships  . Social Herbalist on phone: Not on file    Gets together: Not on file  Attends religious service: Not on file    Active member of club or organization: Not on file    Attends meetings of clubs or organizations: Not on file    Relationship status: Not on file  . Intimate partner violence    Fear of current or ex partner: Not on file    Emotionally abused: Not on file    Physically abused: Not on file    Forced sexual activity: Not on file  Other Topics Concern  . Not on file  Social History Narrative   ** Merged History Encounter **        Family History  Problem Relation Age of Onset  . Breast cancer Sister 87  . Diabetes Sister   . Diabetes Mother   . Hypertension Mother   . Hyperlipidemia Mother   . Eating disorder Mother   . Obesity Mother     Allergies  Allergen Reactions  . Statins Shortness Of Breath    Wheezing, short of breath  . Codeine Nausea Only  . Ibuprofen Other (See Comments)    Reaction:  Raises pts BP  .  Penicillins Hives and Other (See Comments)    Has patient had a PCN reaction causing immediate rash, facial/tongue/throat swelling, SOB or lightheadedness with hypotension: No Has patient had a PCN reaction causing severe rash involving mucus membranes or skin necrosis: No Has patient had a PCN reaction that required hospitalization No Has patient had a PCN reaction occurring within the last 10 years: No If all of the above answers are "NO", then may proceed with Cephalosporin use.  [name] Percocet [Oxycodone-Acetaminophen] Nausea Only  . Tramadol Nausea Only    Can take if she has eaten  . Vicodin [Hydrocodone-Acetaminophen] Nausea Only     Review of Systems   Review of Systems: Negative Unless Checked Constitutional: [] Weight loss  [] Fever  [] Chills Cardiac: [] Chest pain   []  Atrial Fibrillation  [] Palpitations   [] Shortness of breath when laying flat   [] Shortness of breath with exertion. [] Shortness of breath at rest Vascular:  [] Pain in legs with walking   [] Pain in legs with standing [] Pain in legs when laying flat   [x] Claudication    [] Pain in feet when laying flat    [] History of DVT   [] Phlebitis   [x] Swelling in legs   [] Varicose veins   [] Non-healing ulcers Pulmonary:   [] Uses home oxygen   [] Productive cough   [] Hemoptysis   [] Wheeze  [] COPD   [] Asthma Neurologic:  [] Dizziness   [] Seizures  [] Blackouts [] History of stroke   [] History of TIA  [] Aphasia   [] Temporary Blindness   [] Weakness or numbness in arm   [] Weakness or numbness in leg Musculoskeletal:   [] Joint swelling   [] Joint pain   [] Low back pain  []  History of Knee Replacement [] Arthritis [] back Surgeries  []  Spinal Stenosis    Hematologic:  [] Easy bruising  [] Easy bleeding   [] Hypercoagulable state   [] Anemic Gastrointestinal:  [] Diarrhea   [] Vomiting  [x] Gastroesophageal reflux/heartburn   [] Difficulty swallowing. [] Abdominal pain Genitourinary:  [x] Chronic kidney disease   [] Difficult urination  [] Anuric   [] Blood in  urine [] Frequent urination  [] Burning with urination   [] Hematuria Skin:  [] Rashes   [x] Ulcers [x] Wounds Psychological:  [] History of anxiety   []  History of major depression  []  Memory Difficulties      OBJECTIVE:   Physical Exam  BP (!) 157/72 (BP Location: Right Arm, Patient Position: Sitting, Cuff Size: Large)   Pulse 65  Resp 12   Ht 5' 7"  (1.702 m)   Wt 290 lb (131.5 kg)   BMI 45.42 kg/m   Gen: WD/WN, NAD Head: [place]/AT, No temporalis wasting.  Ear/Nose/Throat: Hearing grossly intact, nares w/o erythema or drainage Eyes: PER, EOMI, sclera nonicteric.  Neck: Supple, no masses.  No JVD.  Pulmonary:  Good air movement, no use of accessory muscles.  Cardiac: RRR Vascular:  Ulceration of left ankle with protruding bone Vessel Right Left  Radial Palpable Palpable  Dorsalis Pedis Not Palpable Not Palpable  Posterior Tibial Not Palpable Not Palpable   Gastrointestinal: soft, non-distended. No guarding/no peritoneal signs.  Musculoskeletal: M/S 5/5 throughout.  No deformity or atrophy.  Neurologic: Pain and light touch intact in extremities.  Symmetrical.  Speech is fluent. Motor exam as listed above. Psychiatric: Judgment intact, Mood & affect appropriate for pt's clinical situation. Dermatologic:  No changes consistent with cellulitis. Lymph : No Cervical lymphadenopathy, no lichenification or skin changes of chronic lymphedema.       ASSESSMENT AND PLAN:  1. Skin ulcer of left ankle with necrosis of bone ([place]) Based upon the patient's noninvasive studies, she has adequate blood flow in order to handle a left below-knee amputation due to Charcot's foot and osteomyelitis of her ankle.  The patient has an extensive cardiac history as well as with the additional finding of peripheral artery disease we will obtain cardiac clearance prior to proceeding with amputation.  The patient has recently seen Dr. Rockey Situ.  Risk and benefits were reviewed the patient.  Indications for the  procedure were reviewed.  All questions were answered, the patient agrees to proceed.    2. Essential hypertension Continue antihypertensive medications as already ordered, these medications have been reviewed and there are no changes at this time.   3. PAD (peripheral artery disease) ([place]) Recommend:  The patient has experienced increased symptoms and is now describing lifestyle limiting claudication and mild rest pain.   Given the severity of the patient's right lower extremity symptoms the patient should undergo angiography and intervention.  Risk and benefits were reviewed the patient.  Indications for the procedure were reviewed.  All questions were answered, the patient agrees to proceed.   The patient should continue walking and begin a more formal exercise program.  The patient should continue antiplatelet therapy and aggressive treatment of the lipid abnormalities  The patient will follow up with me after the angiogram.   Due to the more pressing nature of the osteomyelitis of her left lower extremity, we will proceed with amputation first and plan for angiogram at a different date and time.  4. Primary osteoarthritis of right knee Continue NSAID medications as already ordered, these medications have been reviewed and there are no changes at this time.  Continued activity and therapy was stressed.   5. Gastro-esophageal reflux disease without esophagitis Continue PPI as already ordered, this medication has been reviewed and there are no changes at this time.  Avoidence of caffeine and alcohol  Moderate elevation of the head of the bed    Current Outpatient Medications on File Prior to Visit  Medication Sig Dispense Refill  . albuterol (PROVENTIL HFA;VENTOLIN HFA) 108 (90 Base) MCG/ACT inhaler Inhale 1-2 puffs into the lungs every 6 (six) hours as needed for wheezing or shortness of breath.    [name] amLODipine (NORVASC) 10 MG tablet Take 10 mg by mouth daily.    [name] aspirin  EC 325 MG tablet Take 325 mg by mouth daily.    [name]  azelastine (ASTELIN) 0.1 % nasal spray Place into both nostrils 2 (two) times daily. Use in each nostril as directed    . Blood Glucose Monitoring Suppl (ONE TOUCH ULTRA 2) w/Device KIT 1 Device by Does not apply route daily. 1 each 0  . clobetasol cream (TEMOVATE) 5.63 % Apply 1 application topically 2 (two) times daily.    . cloNIDine (CATAPRES) 0.3 MG tablet Take 0.3 mg by mouth 2 (two) times daily.    . diclofenac sodium (VOLTAREN) 1 % GEL Apply topically 4 (four) times daily.    . Dulaglutide (TRULICITY) 1.5 SL/3.7DS SOPN Inject 1.5 mg into the skin once a week. (Patient taking differently: Inject 1.5 mg into the skin once a week. sunday) 4 pen 11  . DULoxetine (CYMBALTA) 30 MG capsule Take 30 mg by mouth daily.     . famotidine (PEPCID) 40 MG tablet Take 40 mg by mouth 2 (two) times daily.    . ferrous sulfate 325 (65 FE) MG tablet Take 325 mg by mouth 3 (three) times daily with meals.    . fluticasone (FLONASE) 50 MCG/ACT nasal spray Place into both nostrils daily.    [name] gabapentin (NEURONTIN) 600 MG tablet Take 600 mg by mouth at bedtime.    [name] glucose blood (ONETOUCH VERIO) test strip 1 each by Other route 2 (two) times a day. Use to monitor glucose levels BID; E11.42 100 each 3  . insulin aspart (NOVOLOG FLEXPEN) 100 UNIT/ML FlexPen 3 times a day (just before each meal) 06-20-11 units. 15 mL 11  . Insulin Pen Needle (BD PEN NEEDLE MICRO U/F) 32G X 6 MM MISC 1 each by Does not apply route 3 (three) times daily. 100 each 3  . isosorbide mononitrate (IMDUR) 120 MG 24 hr tablet Take 120 mg by mouth daily.    . Lancets (ONETOUCH ULTRASOFT) lancets Used to check blood sugars four times daily. 200 each 12  . Multiple Vitamin (MULTIVITAMIN WITH MINERALS) TABS tablet Take 1 tablet by mouth daily.    [name] omeprazole (PRILOSEC) 40 MG capsule Take 40 mg by mouth 2 (two) times daily.    . ondansetron (ZOFRAN) 4 MG tablet Take 4 mg by mouth every 8 (eight)  hours as needed for nausea or vomiting.    . sodium bicarbonate 650 MG tablet Take 1 tablet (650 mg total) by mouth 2 (two) times daily. 60 tablet 0  . torsemide (DEMADEX) 20 MG tablet Take 1 tablet (20 mg total) by mouth daily. Take an extra 61m tablet daily prn in the morning for swelling or sob.    . traMADol (ULTRAM) 50 MG tablet Take 50 mg by mouth every 6 (six) hours as needed for moderate pain or severe pain (Pain). Take with food    . vitamin B-12 (CYANOCOBALAMIN) 1000 MCG tablet Take 1,000 mcg by mouth daily.    . Vitamin D, Ergocalciferol, (DRISDOL) 1.25 MG (50000 UT) CAPS capsule Take 1 capsule (50,000 Units total) by mouth every 7 (seven) days. 4 capsule 0  . ceFAZolin (ANCEF) IVPB Inject 2 g into the vein every 12 (twelve) hours. Indication:  Endocarditis Last Day of Therapy:  02/11/19 Labs - Once weekly:  CBC/D and BMP Labs - Every other week:  ESR and CRP (Patient not taking: Reported on 04/13/2019) 84 Units 0   No current facility-administered medications on file prior to visit.     There are no Patient Instructions on file for this visit. No follow-ups on file.   FKris Hartmann  NP  This note was completed with Sales executive.  Any errors are purely unintentional.

## 2019-04-18 NOTE — Progress Notes (Signed)
Virtual Visit via Video Note   This visit type was conducted due to national recommendations for restrictions regarding the COVID-19 Pandemic (e.g. social distancing) in an effort to limit this patient's exposure and mitigate transmission in our community.  Due to her co-morbid illnesses, this patient is at least at moderate risk for complications without adequate follow up.  This format is felt to be most appropriate for this patient at this time.  All issues noted in this document were discussed and addressed.  A limited physical exam was performed with this format.  Please refer to the patient's chart for her consent to telehealth for [facility].   I connected with  KRISTYANA NOTTE on 04/18/19 by a video enabled telemedicine application and verified that I am speaking with the correct person using two identifiers. I discussed the limitations of evaluation and management by telemedicine. The patient expressed understanding and agreed to proceed.   Evaluation Performed:  Follow-up visit  Date:  04/18/2019   ID:  KENNETTE CUTHRELL, DOB 02-10-1961, MRN [id number]  Patient Location:  [address] [place] [address]   Provider location:   [facility], [place] office  PCP:  Sharyne Peach, MD  Cardiologist:  [facility]   Chief Complaint:  SOB, endocarditis   History of Present Illness:    EMERIE VANDERKOLK is a 58 y.o. female who presents via audio/video conferencing for a telehealth visit today.   The patient does not symptoms concerning for COVID-19 infection (fever, chills, cough, or new SHORTNESS OF BREATH).   Patient has a past medical history of Hypertension;  Anemia of chronic disease;  Obesity, unspecified;  CHF (congestive heart failure) (CMS-HCC);  GERD (gastroesophageal reflux disease);  Bradycardia;  Anxiety,  Hyperparathyroidism (CMS-HCC); Increased PTH level;  CKD (chronic kidney disease) stage 3, GFR 30-59 ml/min;   Type 2  diabetes mellitus with stage 3 chronic kidney disease (CMS-HCC);   Lymphedema   Charcot's joint of left foot  profound Charcot foot deformity in association with this large ulcer Skin ulcer of left ankle with necrosis of bone ([place]) Who presents for preoperative evaluation before vascular procedure, below the knee amputation on the left  In follow-up today we had a long discussion concerning her pending surgery Chronic lower extremity edema Denies shortness of breath or chest pain on torsemide 20 BID Followed by nephrology in [place]  On the schedule for left below-knee amputation.   She is not wanting a prolonged recovery from rods being placed in her leg for reconstructive surgery Has a friend who had similar situation and surgery and eventually required amputation Friend had severe pain in the leg and recovery from rods and she would like to avoid this discomfort She is currently walking with a boot in place  Lives with exhusband  Hx of edema and chest pain. Evaluated by cardiology at [facility] 2017 She has had previous stress testing several years ago, remote catheterization that by report showed no significant coronary disease  diagnosed with congestive heart failure about 12 years ago at [facility].  In hospital 12/2018 Sepsis secondary to group G streptococcal bacteremia with aortic valve endocarditis and left leg soft tissue infection: Hospital records reviewed with the patient in detail Both TTE and TEE confirmed aortic valve endocarditis. Acute hypoxic respiratory failure likely secondary to aspiration pneumonia and decompensated diastolic heart failure: Occurred after extubation post TEE on 2/24, required BiPAP briefly. --done with ABX  CR  3.41 in 01/25/2019  Followed by nephrology Prior to recent hospitalization was only taking Lasix once a day, was discharged on torsemide 40 daily Reports that she does not drink very much, does not make much urine   Hemoglobin A1c 8.4  [facility] records reviewed from 2017 as detailed below stress test done Prior cardiac catheterization.    long-standing hypertension, has been on diuretics since her diagnosis of heart failure.   chronic edema in both lower extremities, left usually worse than right.   Prior CV studies:   The following studies were reviewed today:  Echo 2017 NORMAL LEFT VENTRICULAR SYSTOLIC FUNCTION  WITH MILD LVH NORMAL RIGHT VENTRICULAR SYSTOLIC FUNCTION MILD VALVULAR REGURGITATION (See above) NO PERICARDIAL EFFUSION GRADE 2 DIASTOLIC DYSFUNCTION MILD AORTIC STENOSIS MILDLY DILATED LEFT ATRIUM MILD PULMONARY HTN WITH ESTIMATED RVSP = 45 MMHG   Echo 12/2018 . Small aortic valve mass, cannot exclude endocarditis vegetation. Measures approximately 0.75 x 1 cm. Demonstrates features of independent motion and is primarily seen on the ventricular aspect of the aortic valve. Best visualized in parasternal long  axis and short axis views. No significant aortic valve stenosis or regurgitation.  2. The left ventricle has normal systolic function, with an ejection fraction of 55-60%. The cavity size was normal. Left ventricular diastolic Doppler parameters are consistent with impaired relaxation.  3. The right ventricle has normal systolic function. The cavity was normal. There is no increase in right ventricular wall thickness. Right ventricular systolic pressure is mildly elevated with an estimated pressure of 35.5 mmHg.  4. The mitral valve is normal in structure.  5. The tricuspid valve is normal in structure.  6. The pulmonic valve was normal in structure.  7. The aortic valve is normal in structure.  8. Cannot exclude small PFO with left to right shunt by color flow Doppler.  9. The inferior vena cava was dilated in size with <50% respiratory variability.   Past Medical History:  Diagnosis Date  . Allergic rhinitis   . Anemia   . Anxiety   . Arthritis   . Back pain   .  Bradycardia   . Breast mass    Patient can no longer palpate specific masses but showed tech general area of concern  . CHF (congestive heart failure) ([place])   . CKD (chronic kidney disease)    STAGE 3  . Constipation   . Diabetes mellitus without complication ([place])   . Diabetic nephropathy ([place])   . Dyspnea   . GERD (gastroesophageal reflux disease)   . HLD (hyperlipidemia)   . Hyperparathyroidism ([place])   . Hypertension   . Joint pain   . Leg edema   . Legally blind in left eye, as defined in [place]   . Lymphedema   . Obesity   . Onychomycosis    Past Surgical History:  Procedure Laterality Date  . ABDOMINAL HYSTERECTOMY    . BREAST BIOPSY Left 2014   FNA 12:00 position - Negative  . EYE SURGERY    . IR FLUORO GUIDE CV LINE RIGHT  12/29/2018  . IR REMOVAL TUN CV CATH W/O FL  02/19/2019  . IR US GUIDE VASC ACCESS RIGHT  12/29/2018  . TEE WITHOUT CARDIOVERSION N/A 12/28/2018   Procedure: TRANSESOPHAGEAL ECHOCARDIOGRAM (TEE);  Surgeon: Sueanne Margarita, MD;  Location: [facility] ENDOSCOPY;  Service: Cardiovascular;  Laterality: N/A;  . VAGINAL HYSTERECTOMY       No outpatient medications have been marked as taking for the 04/19/19 encounter (Appointment) with Rockey Situ,  Kathlene November, MD.     Allergies:   Statins, Codeine, Ibuprofen, Penicillins, Percocet [oxycodone-acetaminophen], Tramadol, and Vicodin [hydrocodone-acetaminophen]   Social History   Tobacco Use  . Smoking status: Never Smoker  . Smokeless tobacco: Never Used  Substance Use Topics  . Alcohol use: No  . Drug use: Never     Current Outpatient Medications on File Prior to Visit  Medication Sig Dispense Refill  . albuterol (PROVENTIL HFA;VENTOLIN HFA) 108 (90 Base) MCG/ACT inhaler Inhale 1-2 puffs into the lungs every 6 (six) hours as needed for wheezing or shortness of breath.    [name] amLODipine (NORVASC) 10 MG tablet Take 10 mg by mouth daily.    [name] aspirin EC 325 MG tablet Take 325 mg by mouth daily.    [name] azelastine (ASTELIN)  0.1 % nasal spray Place into both nostrils 2 (two) times daily. Use in each nostril as directed    . Blood Glucose Monitoring Suppl (ONE TOUCH ULTRA 2) w/Device KIT 1 Device by Does not apply route daily. 1 each 0  . ceFAZolin (ANCEF) IVPB Inject 2 g into the vein every 12 (twelve) hours. Indication:  Endocarditis Last Day of Therapy:  02/11/19 Labs - Once weekly:  CBC/D and BMP Labs - Every other week:  ESR and CRP (Patient not taking: Reported on 04/13/2019) 84 Units 0  . clobetasol cream (TEMOVATE) 3.43 % Apply 1 application topically 2 (two) times daily.    . cloNIDine (CATAPRES) 0.3 MG tablet Take 0.3 mg by mouth 2 (two) times daily.    . diclofenac sodium (VOLTAREN) 1 % GEL Apply topically 4 (four) times daily.    . Dulaglutide (TRULICITY) 1.5 HW/8.6HU SOPN Inject 1.5 mg into the skin once a week. (Patient taking differently: Inject 1.5 mg into the skin once a week. sunday) 4 pen 11  . DULoxetine (CYMBALTA) 30 MG capsule Take 30 mg by mouth daily.     . famotidine (PEPCID) 40 MG tablet Take 40 mg by mouth 2 (two) times daily.    . ferrous sulfate 325 (65 FE) MG tablet Take 325 mg by mouth 3 (three) times daily with meals.    . fluticasone (FLONASE) 50 MCG/ACT nasal spray Place into both nostrils daily.    [name] gabapentin (NEURONTIN) 600 MG tablet Take 600 mg by mouth at bedtime.    [name] glucose blood (ONETOUCH VERIO) test strip 1 each by Other route 2 (two) times a day. Use to monitor glucose levels BID; E11.42 100 each 3  . insulin aspart (NOVOLOG FLEXPEN) 100 UNIT/ML FlexPen 3 times a day (just before each meal) 06-20-11 units. 15 mL 11  . Insulin Pen Needle (BD PEN NEEDLE MICRO U/F) 32G X 6 MM MISC 1 each by Does not apply route 3 (three) times daily. 100 each 3  . isosorbide mononitrate (IMDUR) 120 MG 24 hr tablet Take 120 mg by mouth daily.    . Lancets (ONETOUCH ULTRASOFT) lancets Used to check blood sugars four times daily. 200 each 12  . Multiple Vitamin (MULTIVITAMIN WITH MINERALS) TABS  tablet Take 1 tablet by mouth daily.    [name] omeprazole (PRILOSEC) 40 MG capsule Take 40 mg by mouth 2 (two) times daily.    . ondansetron (ZOFRAN) 4 MG tablet Take 4 mg by mouth every 8 (eight) hours as needed for nausea or vomiting.    . sodium bicarbonate 650 MG tablet Take 1 tablet (650 mg total) by mouth 2 (two) times daily. 60 tablet 0  . torsemide (DEMADEX)  20 MG tablet Take 1 tablet (20 mg total) by mouth daily. Take an extra 52m tablet daily prn in the morning for swelling or sob.    . traMADol (ULTRAM) 50 MG tablet Take 50 mg by mouth every 6 (six) hours as needed for moderate pain or severe pain (Pain). Take with food    . vitamin B-12 (CYANOCOBALAMIN) 1000 MCG tablet Take 1,000 mcg by mouth daily.    . Vitamin D, Ergocalciferol, (DRISDOL) 1.25 MG (50000 UT) CAPS capsule Take 1 capsule (50,000 Units total) by mouth every 7 (seven) days. 4 capsule 0   No current facility-administered medications on file prior to visit.      Family Hx: The patient's family history includes Breast cancer (age of onset: 547 in her sister; Diabetes in her mother and sister; Eating disorder in her mother; Hyperlipidemia in her mother; Hypertension in her mother; Obesity in her mother.  ROS:   Please see the history of present illness.    Review of Systems  Constitutional: Negative.   Respiratory: Negative.   Cardiovascular: Positive for leg swelling.  Gastrointestinal: Negative.   Musculoskeletal: Positive for joint pain.  Neurological: Negative.   Psychiatric/Behavioral: Negative.   All other systems reviewed and are negative.     Labs/Other Tests and Data Reviewed:    Recent Labs: 12/28/2018: ALT 56; B Natriuretic Peptide 601.9 12/31/2018: BUN 38; Creatinine, Ser 2.69; Hemoglobin 8.3; Platelets 306; Potassium 3.6; Sodium 135   Recent Lipid Panel Lab Results  Component Value Date/Time   CHOL 191 06/25/2018 10:50 AM   CHOL 157 11/20/2013 09:54 AM   TRIG 104 06/25/2018 10:50 AM   TRIG 114  11/20/2013 09:54 AM   HDL 47 06/25/2018 10:50 AM   HDL 38 (L) 11/20/2013 09:54 AM   LDLCALC 123 (H) 06/25/2018 10:50 AM   LDLCALC 96 11/20/2013 09:54 AM    Wt Readings from Last 3 Encounters:  04/13/19 290 lb (131.5 kg)  04/12/19 290 lb (131.5 kg)  03/17/19 296 lb (134.3 kg)     Exam:    Vital Signs: Vital signs may also be detailed in the HPI There were no vitals taken for this visit.  Wt Readings from Last 3 Encounters:  04/13/19 290 lb (131.5 kg)  04/12/19 290 lb (131.5 kg)  03/17/19 296 lb (134.3 kg)   Temp Readings from Last 3 Encounters:  02/10/19 98 F (36.7 C)  01/27/19 97.6 F (36.4 C)  01/20/19 98.1 F (36.7 C)   BP Readings from Last 3 Encounters:  04/13/19 (!) 157/72  04/12/19 (!) 145/83  03/17/19 136/84   Pulse Readings from Last 3 Encounters:  04/13/19 65  04/12/19 64  03/17/19 65    BP 120/80 Pulse : 60 Resp: 18 Well nourished, well developed female in no acute distress. Constitutional:  oriented to person, place, and time. No distress.  Head: Normocephalic and atraumatic.  Eyes:  no discharge. No scleral icterus.  Neck: Normal range of motion. Neck supple.  Pulmonary/Chest: No audible wheezing, no distress, appears comfortable Musculoskeletal: Normal range of motion.  no  tenderness or deformity.  Neurological:   Coordination normal. Full exam not performed Skin:  No rash Psychiatric:  normal mood and affect. behavior is normal. Thought content normal.    ASSESSMENT & PLAN:    Preop cardiovascular evaluation Acceptable risk for surgery, no further testing needed at this time EKG will be scanned in the system Long discussion concerning the surgery, she feels she is making the right decision for  her  Chronic diastolic CHF (congestive heart failure) (HCC) Appears to be euvolemic on torsemide 40 daily For her surgery recommended we try to minimize IV fluids  Essential hypertension Blood pressure is well controlled on today's visit. No  changes made to the medications.  Leg edema Chronic lymphedema  Chest pain of uncertain etiology No recent chest pain symptoms, no further ischemic work-up  Endocarditis February 2020 Completed 6 weeks antibiotics Denies chills fever rigors   COVID-19 Education: The signs and symptoms of COVID-19 were discussed with the patient and how to seek care for testing (follow up with PCP or arrange E-visit).  The importance of social distancing was discussed today.  Patient Risk:   After full review of this patients clinical status, I feel that they are at least moderate risk at this time.  Time:    Total encounter time more than 25 minutes  Greater than 50% was spent in counseling and coordination of care with the patient    Medication Adjustments/Labs and Tests Ordered: Current medicines are reviewed at length with the patient today.  Concerns regarding medicines are outlined above.   Tests Ordered: No tests ordered   Medication Changes: No changes made   Disposition: Follow-up in 6 months   Signed, Ida Rogue, MD  04/18/2019 2:14 PM    [place] Office [address] [place] [address], [place], [place] [address]

## 2019-04-19 ENCOUNTER — Ambulatory Visit (INDEPENDENT_AMBULATORY_CARE_PROVIDER_SITE_OTHER): Payer: Medicare HMO | Admitting: Cardiovascular Disease

## 2019-04-19 ENCOUNTER — Other Ambulatory Visit: Payer: Self-pay

## 2019-04-19 ENCOUNTER — Ambulatory Visit: Payer: Medicare HMO | Admitting: Podiatry

## 2019-04-19 VITALS — BP 132/64 | HR 60 | Ht 68.0 in | Wt 290.0 lb

## 2019-04-19 DIAGNOSIS — R079 Chest pain, unspecified: Secondary | ICD-10-CM | POA: Insufficient documentation

## 2019-04-19 DIAGNOSIS — R600 Localized edema: Secondary | ICD-10-CM | POA: Diagnosis not present

## 2019-04-19 DIAGNOSIS — I10 Essential (primary) hypertension: Secondary | ICD-10-CM

## 2019-04-19 DIAGNOSIS — I5032 Chronic diastolic (congestive) heart failure: Secondary | ICD-10-CM

## 2019-04-19 DIAGNOSIS — R0789 Other chest pain: Secondary | ICD-10-CM

## 2019-04-19 DIAGNOSIS — I330 Acute and subacute infective endocarditis: Secondary | ICD-10-CM

## 2019-04-19 MED ORDER — ISOSORBIDE MONONITRATE ER 60 MG PO TB24: 60.0000 mg | ORAL_TABLET | Freq: Two times a day (BID) | ORAL | 3 refills | Status: DC

## 2019-04-19 MED ORDER — ISOSORBIDE MONONITRATE ER 30 MG PO TB24: 30.0000 mg | ORAL_TABLET | Freq: Every day | ORAL | 3 refills | Status: DC

## 2019-04-19 NOTE — Patient Instructions (Addendum)
Can we call [facility] to get imdur dosing?   Medication Instructions:  On torsemide 20 BID Changed Isosorbide mononitrate to 60 mg twice a day and 30 mg twice a day for total of 90 mg twice a day  If you need a refill on your cardiac medications before your next appointment, please call your pharmacy.    Lab work: No new labs needed   If you have labs (blood work) drawn today and your tests are completely normal, you will receive your results only by: [name] MyChart Message (if you have MyChart) OR . A paper copy in the mail If you have any lab test that is abnormal or we need to change your treatment, we will call you to review the results.   Testing/Procedures: No new testing needed   Follow-Up: At [facility], you and your health needs are our priority.  As part of our continuing mission to provide you with exceptional heart care, we have created designated Provider Care Teams.  These Care Teams include your primary Cardiologist (physician) and Advanced Practice Providers (APPs -  Physician Assistants and Nurse Practitioners) who all work together to provide you with the care you need, when you need it.  . You will need a follow up appointment 6 months .   Please call our office 2 months in advance to schedule this appointment.    . Providers on your designated Care Team:   . Murray Hodgkins, NP . Christell Faith, PA-C . Marrianne Mood, PA-C  Any Other Special Instructions Will Be Listed Below (If Applicable).  For educational health videos Log in to : [email or web address] Or : [email or web address], password : [email or web address]

## 2019-04-20 ENCOUNTER — Ambulatory Visit: Payer: Medicare HMO | Admitting: Cardiovascular Disease

## 2019-04-20 ENCOUNTER — Telehealth: Payer: Self-pay | Admitting: Cardiovascular Disease

## 2019-04-20 MED ORDER — ISOSORBIDE MONONITRATE ER 30 MG PO TB24: 30.0000 mg | ORAL_TABLET | Freq: Two times a day (BID) | ORAL | 3 refills | Status: DC

## 2019-04-20 NOTE — Telephone Encounter (Signed)
Please call to verify mg for Isosorbide.

## 2019-04-20 NOTE — Telephone Encounter (Signed)
Patient had office visit with Dr Rockey Situ yesterday: "Medication Instructions:  On torsemide 20 BID Changed Isosorbide mononitrate to 60 mg twice a day and 30 mg twice a day for total of 90 mg twice a day"  Pharmacy wanted to clarify isosorbide mononitrate 30 mg prescription. Rx was sent yesterday day as once a day with 180 tablets. It should be 30 mg two times a day. She took verbal to update prescription at [facility] for two times a day. Med list in Epic updated as well.

## 2019-04-21 ENCOUNTER — Ambulatory Visit: Payer: Medicare HMO | Admitting: Podiatry

## 2019-04-23 ENCOUNTER — Encounter (INDEPENDENT_AMBULATORY_CARE_PROVIDER_SITE_OTHER): Payer: Self-pay

## 2019-04-26 ENCOUNTER — Other Ambulatory Visit (INDEPENDENT_AMBULATORY_CARE_PROVIDER_SITE_OTHER): Payer: Self-pay | Admitting: Nurse Practitioner

## 2019-04-30 ENCOUNTER — Other Ambulatory Visit: Payer: Self-pay

## 2019-04-30 ENCOUNTER — Encounter
Admission: RE | Admit: 2019-04-30 | Discharge: 2019-04-30 | Disposition: A | Discharge status: Home / Self Care | Payer: Medicare HMO | Source: Ambulatory Visit | Attending: Vascular Surgery | Admitting: Vascular Surgery

## 2019-04-30 DIAGNOSIS — Z1159 Encounter for screening for other viral diseases: Secondary | ICD-10-CM | POA: Diagnosis not present

## 2019-04-30 DIAGNOSIS — Z01818 Encounter for other preprocedural examination: Secondary | ICD-10-CM | POA: Diagnosis not present

## 2019-04-30 HISTORY — DX: Cardiac murmur, unspecified: R01.1

## 2019-04-30 LAB — CBC WITH DIFFERENTIAL/PLATELET
Abs Immature Granulocytes: 0.04 10*3/uL (ref 0.00–0.07)
Basophils Absolute: 0.1 10*3/uL (ref 0.0–0.1)
Basophils Relative: 1 %
Eosinophils Absolute: 0.3 10*3/uL (ref 0.0–0.5)
Eosinophils Relative: 4 %
HCT: 30.9 % — ABNORMAL LOW (ref 36.0–46.0)
Hemoglobin: 9.8 g/dL — ABNORMAL LOW (ref 12.0–15.0)
Immature Granulocytes: 1 %
Lymphocytes Relative: 35 %
Lymphs Abs: 2.7 10*3/uL (ref 0.7–4.0)
MCH: 28.6 pg (ref 26.0–34.0)
MCHC: 31.7 g/dL (ref 30.0–36.0)
MCV: 90.1 fL (ref 80.0–100.0)
Monocytes Absolute: 0.6 10*3/uL (ref 0.1–1.0)
Monocytes Relative: 7 %
Neutro Abs: 3.9 10*3/uL (ref 1.7–7.7)
Neutrophils Relative %: 52 %
Platelets: 206 10*3/uL (ref 150–400)
RBC: 3.43 MIL/uL — ABNORMAL LOW (ref 3.87–5.11)
RDW: 12.6 % (ref 11.5–15.5)
WBC: 7.5 10*3/uL (ref 4.0–10.5)
nRBC: 0 % (ref 0.0–0.2)

## 2019-04-30 LAB — BASIC METABOLIC PANEL
Anion gap: 11 (ref 5–15)
BUN: 44 mg/dL — ABNORMAL HIGH (ref 6–20)
CO2: 26 mmol/L (ref 22–32)
Calcium: 8.7 mg/dL — ABNORMAL LOW (ref 8.9–10.3)
Chloride: 101 mmol/L (ref 98–111)
Creatinine, Ser: 2.95 mg/dL — ABNORMAL HIGH (ref 0.44–1.00)
GFR calc Af Amer: 19 mL/min — ABNORMAL LOW (ref >60)
GFR calc non Af Amer: 17 mL/min — ABNORMAL LOW (ref >60)
Glucose, Bld: 176 mg/dL — ABNORMAL HIGH (ref 70–99)
Potassium: 4.6 mmol/L (ref 3.5–5.1)
Sodium: 138 mmol/L (ref 135–145)

## 2019-04-30 LAB — APTT: aPTT: 31 seconds (ref 24–36)

## 2019-04-30 LAB — PROTIME-INR
INR: 1.1 (ref 0.8–1.2)
Prothrombin Time: 14.4 seconds (ref 11.4–15.2)

## 2019-04-30 NOTE — Pre-Procedure Instructions (Signed)
Patient very sleepy during interview.  States she took pain medication this morning.

## 2019-04-30 NOTE — Patient Instructions (Signed)
INSTRUCTIONS FOR SURGERY     Your surgery is scheduled for: Wednesday, May 05, 2019       To find out your arrival time for the day of surgery,          please call [phone] between 1 pm and 3 pm on :  Tuesday, May 04, 2019     When you arrive for surgery, report to the [facility].       Do NOT stop on the first floor to register.    REMEMBER: Instructions that are not followed completely may result in serious medical risk,  up to and including death, or upon the discretion of your surgeon and anesthesiologist,            your surgery may need to be rescheduled.  __X__ 1. Do not eat food after midnight the night before your procedure.                    No gum, candy, lozenger, tic tacs, tums or hard candies.                  ABSOLUTELY NOTHING SOLID IN YOUR MOUTH AFTER MIDNIGHT                    You may drink unlimited clear liquids up to 2 hours before you are scheduled to arrive for surgery.                   Do not drink anything within those 2 hours unless you need to take medicine, then take the                   smallest amount you need.  Clear liquids include:  water, apple juice without pulp,                   any flavor Gatorade, Black coffee, black tea.  Sugar may be added but no dairy/ honey /lemon.                        Broth and jello is not considered a clear liquid.  __x__  2. On the morning of surgery, please brush your teeth with toothpaste and water. You may rinse with                  mouthwash if you wish but DO NOT SWALLOW TOOTHPASTE OR MOUTHWASH  __X___3. NO alcohol for 24 hours before or after surgery.  __x___ 4.  Do NOT smoke or use e-cigarettes for 24 HOURS PRIOR TO SURGERY.                      DO NOT Use any chewable tobacco products for at least 6 hours prior to surgery.  __x___ 5. If you start any new medication after this appointment and prior to surgery, please            Bring it with you on the day of surgery.  ___x__ 6. Notify your doctor if there is any change in your medical condition, such as fever,  infection, vomitting,                   Diarrhea or any open sores.  __x___ 7.  USE the CHG WIPES as instructed, the night before surgery and the day of surgery.                   Once you have washed with this soap, do NOT use any of the following: Powders, perfumes                    or lotions. Please do not wear make up, hairpins, clips or nail polish. You may wear deodorant.                   Men may shave their face and neck.  Women need to shave 48 hours prior to surgery.                   DO NOT wear ANY jewelry on the day of surgery. If there are rings that are too tight to                    remove easily, please address this prior to the surgery day. Piercings need to be removed.                                                                     NO METAL ON YOUR BODY.                    Do NOT bring any valuables.  If you came to Pre-Admit testing then you will not need license,                     insurance card or credit card.  If you will be staying overnight, please either leave your things in                     the car or have your family be responsible for these items.                     [facility] IS NOT RESPONSIBLE FOR BELONGINGS OR VALUABLES.  ___X__ 8. DO NOT wear contact lenses on surgery day.  You may not have dentures,                     Hearing aides, contacts or glasses in the operating room. These items can be                    Placed in the Recovery Room to receive immediately after surgery.  __x___ 9. IF YOU ARE SCHEDULED TO GO HOME ON THE SAME DAY, YOU MUST                   Have someone to drive you home and to stay with you  for the first 24 hours.                    Have an arrangement prior to arriving on surgery day.  ___x__ 10. Take the following medications on the morning of surgery with a  sip of water:                               1.PRILOSEC                     2.AMLODIPINE                     3.CLONIDINE                     4.CYMBALTA                     5.FLONASE                     6.IMDUR                     7.ALBUTEROL INHALER  _____ 11.  Follow any instructions provided to you by your surgeon.                        Such as enema, clear liquid bowel prep  __x___ 13. STOP Anti-inflammatories as of: TODAY                      This includes IBUPROFEN / MOTRIN / ADVIL / ALEVE/ NAPROXYN                    YOU MAY TAKE TYLENOL ANY TIME PRIOR TO SURGERY.  __X___ 14.  Stop supplements until after surgery.                     This includes: GLUCOSAMINE                 You may continue taking Vitamin B12 / Vitamin D3 but do not take on the morning of surgery.  _____ 15. Bring your CPAP machine into preop with you on the morning of surgery.  ___X___17.  Continue to take the following medications but do not take on the morning of surgery:                     TORSEMIDE, CLOBETASOL CREAM, VITAMIN B12 , MULTIVITS, GABAPENTIN , ASPIRIN  ___X___18. If staying overnight, please have appropriate shoes to wear to be able to walk around the unit.                   Wear clean and comfortable clothing to the hospital.  __X____19.Take half of evening dose of insulin on the night before surgery.  DO NOT TAKE ANY INSULIN                   ON THE MORNING OF SURGERY. You may take Trulicity on Sunday.  BRING PHONE [place] DR. SCHNIER TO CONTACT. [place] PHONE [place]. DO CONTINUE TO TAKE ASPIRIN BUT NOT ON MORNING OF SURGERY

## 2019-05-01 LAB — NOVEL CORONAVIRUS, NAA (HOSP ORDER, SEND-OUT TO REF LAB; TAT 18-24 HRS): SARS-CoV-2, NAA: NOT DETECTED

## 2019-05-04 MED ORDER — CLINDAMYCIN PHOSPHATE 300 MG/50ML IV SOLN
300.0000 mg | INTRAVENOUS | Status: AC
  Administered 2019-05-05: 300 mg via INTRAVENOUS

## 2019-05-05 ENCOUNTER — Inpatient Hospital Stay
Admission: RE | Admit: 2019-05-05 | Discharge: 2019-05-12 | DRG: 617 | Disposition: A | Discharge status: Skilled Nursing Facility | Payer: Medicare HMO | Attending: Vascular Surgery | Admitting: Vascular Surgery

## 2019-05-05 ENCOUNTER — Inpatient Hospital Stay: Payer: Medicare HMO | Admitting: Anesthesiology

## 2019-05-05 ENCOUNTER — Encounter
Admission: RE | Disposition: A | Discharge status: Skilled Nursing Facility | Payer: Self-pay | Source: Home / Self Care | Attending: Vascular Surgery

## 2019-05-05 ENCOUNTER — Encounter: Payer: Self-pay | Admitting: Anesthesiology

## 2019-05-05 ENCOUNTER — Other Ambulatory Visit: Payer: Self-pay

## 2019-05-05 DIAGNOSIS — I5032 Chronic diastolic (congestive) heart failure: Secondary | ICD-10-CM | POA: Diagnosis not present

## 2019-05-05 DIAGNOSIS — Z885 Allergy status to narcotic agent status: Secondary | ICD-10-CM | POA: Diagnosis not present

## 2019-05-05 DIAGNOSIS — I130 Hypertensive heart and chronic kidney disease with heart failure and stage 1 through stage 4 chronic kidney disease, or unspecified chronic kidney disease: Secondary | ICD-10-CM | POA: Diagnosis not present

## 2019-05-05 DIAGNOSIS — E669 Obesity, unspecified: Secondary | ICD-10-CM | POA: Diagnosis present

## 2019-05-05 DIAGNOSIS — Z880 Allergy status to penicillin: Secondary | ICD-10-CM

## 2019-05-05 DIAGNOSIS — F419 Anxiety disorder, unspecified: Secondary | ICD-10-CM | POA: Diagnosis not present

## 2019-05-05 DIAGNOSIS — Z1159 Encounter for screening for other viral diseases: Secondary | ICD-10-CM

## 2019-05-05 DIAGNOSIS — Z4781 Encounter for orthopedic aftercare following surgical amputation: Secondary | ICD-10-CM | POA: Diagnosis not present

## 2019-05-05 DIAGNOSIS — D62 Acute posthemorrhagic anemia: Secondary | ICD-10-CM | POA: Diagnosis not present

## 2019-05-05 DIAGNOSIS — E11610 Type 2 diabetes mellitus with diabetic neuropathic arthropathy: Secondary | ICD-10-CM | POA: Diagnosis present

## 2019-05-05 DIAGNOSIS — Z90710 Acquired absence of both cervix and uterus: Secondary | ICD-10-CM

## 2019-05-05 DIAGNOSIS — E213 Hyperparathyroidism, unspecified: Secondary | ICD-10-CM | POA: Diagnosis present

## 2019-05-05 DIAGNOSIS — L97324 Non-pressure chronic ulcer of left ankle with necrosis of bone: Secondary | ICD-10-CM | POA: Diagnosis present

## 2019-05-05 DIAGNOSIS — E1152 Type 2 diabetes mellitus with diabetic peripheral angiopathy with gangrene: Secondary | ICD-10-CM | POA: Diagnosis present

## 2019-05-05 DIAGNOSIS — E559 Vitamin D deficiency, unspecified: Secondary | ICD-10-CM | POA: Diagnosis not present

## 2019-05-05 DIAGNOSIS — M869 Osteomyelitis, unspecified: Secondary | ICD-10-CM | POA: Diagnosis present

## 2019-05-05 DIAGNOSIS — H548 Legal blindness, as defined in USA: Secondary | ICD-10-CM | POA: Diagnosis present

## 2019-05-05 DIAGNOSIS — E785 Hyperlipidemia, unspecified: Secondary | ICD-10-CM | POA: Diagnosis not present

## 2019-05-05 DIAGNOSIS — Z23 Encounter for immunization: Secondary | ICD-10-CM | POA: Diagnosis present

## 2019-05-05 DIAGNOSIS — I96 Gangrene, not elsewhere classified: Secondary | ICD-10-CM

## 2019-05-05 DIAGNOSIS — K219 Gastro-esophageal reflux disease without esophagitis: Secondary | ICD-10-CM | POA: Diagnosis not present

## 2019-05-05 DIAGNOSIS — N183 Chronic kidney disease, stage 3 (moderate): Secondary | ICD-10-CM | POA: Diagnosis not present

## 2019-05-05 DIAGNOSIS — Z89512 Acquired absence of left leg below knee: Secondary | ICD-10-CM | POA: Diagnosis not present

## 2019-05-05 DIAGNOSIS — N189 Chronic kidney disease, unspecified: Secondary | ICD-10-CM | POA: Diagnosis present

## 2019-05-05 DIAGNOSIS — Z888 Allergy status to other drugs, medicaments and biological substances status: Secondary | ICD-10-CM

## 2019-05-05 DIAGNOSIS — E1122 Type 2 diabetes mellitus with diabetic chronic kidney disease: Secondary | ICD-10-CM | POA: Diagnosis not present

## 2019-05-05 DIAGNOSIS — Z803 Family history of malignant neoplasm of breast: Secondary | ICD-10-CM

## 2019-05-05 DIAGNOSIS — Z8249 Family history of ischemic heart disease and other diseases of the circulatory system: Secondary | ICD-10-CM

## 2019-05-05 DIAGNOSIS — I509 Heart failure, unspecified: Secondary | ICD-10-CM | POA: Diagnosis present

## 2019-05-05 DIAGNOSIS — M86672 Other chronic osteomyelitis, left ankle and foot: Secondary | ICD-10-CM | POA: Diagnosis present

## 2019-05-05 DIAGNOSIS — E11622 Type 2 diabetes mellitus with other skin ulcer: Secondary | ICD-10-CM | POA: Diagnosis present

## 2019-05-05 DIAGNOSIS — J309 Allergic rhinitis, unspecified: Secondary | ICD-10-CM | POA: Diagnosis not present

## 2019-05-05 DIAGNOSIS — E1169 Type 2 diabetes mellitus with other specified complication: Secondary | ICD-10-CM | POA: Diagnosis present

## 2019-05-05 DIAGNOSIS — E1121 Type 2 diabetes mellitus with diabetic nephropathy: Secondary | ICD-10-CM | POA: Diagnosis not present

## 2019-05-05 DIAGNOSIS — I739 Peripheral vascular disease, unspecified: Secondary | ICD-10-CM | POA: Diagnosis not present

## 2019-05-05 DIAGNOSIS — D638 Anemia in other chronic diseases classified elsewhere: Secondary | ICD-10-CM | POA: Diagnosis not present

## 2019-05-05 DIAGNOSIS — Z833 Family history of diabetes mellitus: Secondary | ICD-10-CM

## 2019-05-05 DIAGNOSIS — E1142 Type 2 diabetes mellitus with diabetic polyneuropathy: Secondary | ICD-10-CM | POA: Diagnosis not present

## 2019-05-05 DIAGNOSIS — M1711 Unilateral primary osteoarthritis, right knee: Secondary | ICD-10-CM | POA: Diagnosis not present

## 2019-05-05 HISTORY — PX: AMPUTATION: SHX166

## 2019-05-05 LAB — CBC
HCT: 31.6 % — ABNORMAL LOW (ref 36.0–46.0)
Hemoglobin: 10.2 g/dL — ABNORMAL LOW (ref 12.0–15.0)
MCH: 29.1 pg (ref 26.0–34.0)
MCHC: 32.3 g/dL (ref 30.0–36.0)
MCV: 90.3 fL (ref 80.0–100.0)
Platelets: 186 10*3/uL (ref 150–400)
RBC: 3.5 MIL/uL — ABNORMAL LOW (ref 3.87–5.11)
RDW: 12.8 % (ref 11.5–15.5)
WBC: 10.2 10*3/uL (ref 4.0–10.5)
nRBC: 0 % (ref 0.0–0.2)

## 2019-05-05 LAB — GLUCOSE, CAPILLARY
Glucose-Capillary: 190 mg/dL — ABNORMAL HIGH (ref 70–99)
Glucose-Capillary: 196 mg/dL — ABNORMAL HIGH (ref 70–99)
Glucose-Capillary: 348 mg/dL — ABNORMAL HIGH (ref 70–99)
Glucose-Capillary: 274 mg/dL — ABNORMAL HIGH (ref 70–99)

## 2019-05-05 LAB — ABO/RH: ABO/RH(D): A POS

## 2019-05-05 LAB — CREATININE, SERUM
Creatinine, Ser: 3.08 mg/dL — ABNORMAL HIGH (ref 0.44–1.00)
GFR calc Af Amer: 18 mL/min — ABNORMAL LOW (ref >60)
GFR calc non Af Amer: 16 mL/min — ABNORMAL LOW (ref >60)

## 2019-05-05 LAB — PREPARE RBC (CROSSMATCH)

## 2019-05-05 SURGERY — AMPUTATION BELOW KNEE
Anesthesia: General | Laterality: Left

## 2019-05-05 MED ORDER — ENOXAPARIN SODIUM 40 MG/0.4ML ~~LOC~~ SOLN: 40.0000 mg | SUBCUTANEOUS | Status: DC

## 2019-05-05 MED ORDER — FENTANYL CITRATE (PF) 100 MCG/2ML IJ SOLN
50.0000 ug | Freq: Once | INTRAMUSCULAR | Status: AC
  Administered 2019-05-05: 50 ug via INTRAVENOUS

## 2019-05-05 MED ORDER — PNEUMOCOCCAL VAC POLYVALENT 25 MCG/0.5ML IJ INJ
0.5000 mL | INJECTION | INTRAMUSCULAR | Status: AC
  Administered 2019-05-06: 0.5 mL via INTRAMUSCULAR
  Filled 2019-05-05: qty 0.5

## 2019-05-05 MED ORDER — ACETAMINOPHEN 650 MG RE SUPP: 650.0000 mg | Freq: Four times a day (QID) | RECTAL | Status: DC | PRN

## 2019-05-05 MED ORDER — ACETAMINOPHEN 325 MG PO TABS
650.0000 mg | ORAL_TABLET | Freq: Four times a day (QID) | ORAL | Status: DC | PRN
  Administered 2019-05-05 – 2019-05-12 (×5): 650 mg via ORAL
  Filled 2019-05-05 (×4): qty 2

## 2019-05-05 MED ORDER — AMLODIPINE BESYLATE 10 MG PO TABS
10.0000 mg | ORAL_TABLET | Freq: Every day | ORAL | Status: DC
  Administered 2019-05-06 – 2019-05-12 (×7): 10 mg via ORAL
  Filled 2019-05-05 (×7): qty 1

## 2019-05-05 MED ORDER — ALBUTEROL SULFATE (2.5 MG/3ML) 0.083% IN NEBU
2.5000 mg | INHALATION_SOLUTION | Freq: Four times a day (QID) | RESPIRATORY_TRACT | Status: DC | PRN
  Administered 2019-05-08: 2.5 mg via RESPIRATORY_TRACT
  Filled 2019-05-05: qty 3

## 2019-05-05 MED ORDER — MIDAZOLAM HCL 2 MG/2ML IJ SOLN
INTRAMUSCULAR | Status: DC | PRN
  Administered 2019-05-05: 2 mg via INTRAVENOUS

## 2019-05-05 MED ORDER — OXYCODONE HCL 5 MG/5ML PO SOLN: 5.0000 mg | Freq: Once | ORAL | Status: DC | PRN

## 2019-05-05 MED ORDER — ROCURONIUM BROMIDE 100 MG/10ML IV SOLN
INTRAVENOUS | Status: DC | PRN
  Administered 2019-05-05: 20 mg via INTRAVENOUS
  Administered 2019-05-05: 50 mg via INTRAVENOUS

## 2019-05-05 MED ORDER — SUGAMMADEX SODIUM 200 MG/2ML IV SOLN
INTRAVENOUS | Status: DC | PRN
  Administered 2019-05-05: 200 mg via INTRAVENOUS

## 2019-05-05 MED ORDER — FENTANYL CITRATE (PF) 100 MCG/2ML IJ SOLN
INTRAMUSCULAR | Status: AC
  Administered 2019-05-05: 25 ug via INTRAVENOUS
  Filled 2019-05-05: qty 2

## 2019-05-05 MED ORDER — ALBUTEROL SULFATE HFA 108 (90 BASE) MCG/ACT IN AERS: 1.0000 | INHALATION_SPRAY | Freq: Four times a day (QID) | RESPIRATORY_TRACT | Status: DC | PRN

## 2019-05-05 MED ORDER — GLUCOSAMINE-CHONDROITIN 500-400 MG PO TABS: 1.0000 | ORAL_TABLET | Freq: Two times a day (BID) | ORAL | Status: DC

## 2019-05-05 MED ORDER — PROPOFOL 10 MG/ML IV BOLUS
INTRAVENOUS | Status: DC | PRN
  Administered 2019-05-05: 170 mg via INTRAVENOUS

## 2019-05-05 MED ORDER — FLUTICASONE PROPIONATE 50 MCG/ACT NA SUSP
1.0000 | Freq: Every day | NASAL | Status: DC
  Administered 2019-05-08 – 2019-05-12 (×4): 1 via NASAL
  Filled 2019-05-05: qty 16

## 2019-05-05 MED ORDER — PHENYLEPHRINE HCL (PRESSORS) 10 MG/ML IV SOLN
INTRAVENOUS | Status: DC | PRN
  Administered 2019-05-05: 200 ug via INTRAVENOUS
  Administered 2019-05-05: 300 ug via INTRAVENOUS
  Administered 2019-05-05 (×2): 200 ug via INTRAVENOUS
  Administered 2019-05-05: 100 ug via INTRAVENOUS
  Administered 2019-05-05: 200 ug via INTRAVENOUS
  Administered 2019-05-05: 100 ug via INTRAVENOUS
  Administered 2019-05-05: 200 ug via INTRAVENOUS

## 2019-05-05 MED ORDER — BUPIVACAINE LIPOSOME 1.3 % IJ SUSP
INTRAMUSCULAR | Status: AC
  Filled 2019-05-05: qty 20

## 2019-05-05 MED ORDER — ADULT MULTIVITAMIN W/MINERALS CH
1.0000 | ORAL_TABLET | Freq: Every day | ORAL | Status: DC
  Administered 2019-05-06 – 2019-05-12 (×7): 1 via ORAL
  Filled 2019-05-05 (×7): qty 1

## 2019-05-05 MED ORDER — LIDOCAINE HCL (CARDIAC) PF 100 MG/5ML IV SOSY
PREFILLED_SYRINGE | INTRAVENOUS | Status: DC | PRN
  Administered 2019-05-05: 100 mg via INTRAVENOUS

## 2019-05-05 MED ORDER — BUPIVACAINE LIPOSOME 1.3 % IJ SUSP
INTRAMUSCULAR | Status: DC | PRN
  Administered 2019-05-05: 50 mL

## 2019-05-05 MED ORDER — SCOPOLAMINE 1 MG/3DAYS TD PT72
MEDICATED_PATCH | TRANSDERMAL | Status: AC
  Filled 2019-05-05: qty 1

## 2019-05-05 MED ORDER — TORSEMIDE 20 MG PO TABS
20.0000 mg | ORAL_TABLET | Freq: Two times a day (BID) | ORAL | Status: DC
  Administered 2019-05-05 – 2019-05-12 (×14): 20 mg via ORAL
  Filled 2019-05-05 (×15): qty 1

## 2019-05-05 MED ORDER — ACETAMINOPHEN 10 MG/ML IV SOLN
INTRAVENOUS | Status: AC
  Filled 2019-05-05: qty 100

## 2019-05-05 MED ORDER — ASPIRIN EC 81 MG PO TBEC
81.0000 mg | DELAYED_RELEASE_TABLET | Freq: Every day | ORAL | Status: DC
  Administered 2019-05-05 – 2019-05-12 (×8): 81 mg via ORAL
  Filled 2019-05-05 (×8): qty 1

## 2019-05-05 MED ORDER — ONDANSETRON HCL 4 MG/2ML IJ SOLN
INTRAMUSCULAR | Status: DC | PRN
  Administered 2019-05-05: 4 mg via INTRAVENOUS

## 2019-05-05 MED ORDER — FENTANYL CITRATE (PF) 100 MCG/2ML IJ SOLN
25.0000 ug | INTRAMUSCULAR | Status: AC | PRN
  Administered 2019-05-05 (×6): 25 ug via INTRAVENOUS

## 2019-05-05 MED ORDER — OXYCODONE HCL 5 MG PO TABS: 5.0000 mg | ORAL_TABLET | Freq: Once | ORAL | Status: DC | PRN

## 2019-05-05 MED ORDER — CLONIDINE HCL 0.1 MG PO TABS
0.3000 mg | ORAL_TABLET | Freq: Two times a day (BID) | ORAL | Status: DC
  Administered 2019-05-05 – 2019-05-12 (×13): 0.3 mg via ORAL
  Filled 2019-05-05 (×13): qty 3

## 2019-05-05 MED ORDER — SCOPOLAMINE 1 MG/3DAYS TD PT72
MEDICATED_PATCH | TRANSDERMAL | Status: DC | PRN
  Administered 2019-05-05: 1 via TRANSDERMAL

## 2019-05-05 MED ORDER — FENTANYL CITRATE (PF) 250 MCG/5ML IJ SOLN
INTRAMUSCULAR | Status: AC
  Filled 2019-05-05: qty 5

## 2019-05-05 MED ORDER — HYDROMORPHONE HCL 1 MG/ML IJ SOLN
1.0000 mg | INTRAMUSCULAR | Status: DC | PRN
  Administered 2019-05-06: 1 mg via INTRAVENOUS
  Filled 2019-05-05: qty 1

## 2019-05-05 MED ORDER — DULOXETINE HCL 30 MG PO CPEP
30.0000 mg | ORAL_CAPSULE | Freq: Every day | ORAL | Status: DC
  Administered 2019-05-06 – 2019-05-12 (×7): 30 mg via ORAL
  Filled 2019-05-05 (×7): qty 1

## 2019-05-05 MED ORDER — ONDANSETRON HCL 4 MG PO TABS: 4.0000 mg | ORAL_TABLET | Freq: Three times a day (TID) | ORAL | Status: DC | PRN

## 2019-05-05 MED ORDER — POLYETHYLENE GLYCOL 3350 17 G PO PACK
17.0000 g | PACK | Freq: Every day | ORAL | Status: DC | PRN
  Administered 2019-05-08 – 2019-05-10 (×2): 17 g via ORAL
  Filled 2019-05-05 (×2): qty 1

## 2019-05-05 MED ORDER — INSULIN ASPART 100 UNIT/ML ~~LOC~~ SOLN
0.0000 [IU] | Freq: Three times a day (TID) | SUBCUTANEOUS | Status: DC
  Administered 2019-05-05: 11 [IU] via SUBCUTANEOUS
  Administered 2019-05-06: 8 [IU] via SUBCUTANEOUS
  Filled 2019-05-05 (×2): qty 1

## 2019-05-05 MED ORDER — LACTATED RINGERS IV SOLN
INTRAVENOUS | Status: DC | PRN
  Administered 2019-05-05: 09:00:00 via INTRAVENOUS

## 2019-05-05 MED ORDER — CLINDAMYCIN PHOSPHATE 300 MG/50ML IV SOLN
INTRAVENOUS | Status: AC
  Filled 2019-05-05: qty 50

## 2019-05-05 MED ORDER — SODIUM CHLORIDE 0.9 % IV SOLN
INTRAVENOUS | Status: DC
  Administered 2019-05-05: 08:00:00 via INTRAVENOUS

## 2019-05-05 MED ORDER — HYDROCODONE-ACETAMINOPHEN 5-325 MG PO TABS
1.0000 | ORAL_TABLET | ORAL | Status: DC | PRN
  Administered 2019-05-05: 1 via ORAL
  Administered 2019-05-05: 2 via ORAL
  Administered 2019-05-05 – 2019-05-06 (×3): 1 via ORAL
  Administered 2019-05-06 – 2019-05-07 (×5): 2 via ORAL
  Filled 2019-05-05: qty 1
  Filled 2019-05-05 (×3): qty 2
  Filled 2019-05-05: qty 1
  Filled 2019-05-05: qty 2
  Filled 2019-05-05: qty 1
  Filled 2019-05-05 (×4): qty 2

## 2019-05-05 MED ORDER — ONDANSETRON HCL 4 MG PO TABS: 4.0000 mg | ORAL_TABLET | Freq: Four times a day (QID) | ORAL | Status: DC | PRN

## 2019-05-05 MED ORDER — FENTANYL CITRATE (PF) 100 MCG/2ML IJ SOLN
INTRAMUSCULAR | Status: DC | PRN
  Administered 2019-05-05: 100 ug via INTRAVENOUS
  Administered 2019-05-05: 50 ug via INTRAVENOUS

## 2019-05-05 MED ORDER — SODIUM BICARBONATE 650 MG PO TABS
650.0000 mg | ORAL_TABLET | Freq: Two times a day (BID) | ORAL | Status: DC
  Administered 2019-05-05 – 2019-05-12 (×14): 650 mg via ORAL
  Filled 2019-05-05 (×14): qty 1

## 2019-05-05 MED ORDER — DOCUSATE SODIUM 100 MG PO CAPS
100.0000 mg | ORAL_CAPSULE | Freq: Two times a day (BID) | ORAL | Status: DC
  Administered 2019-05-05 – 2019-05-12 (×14): 100 mg via ORAL
  Filled 2019-05-05 (×14): qty 1

## 2019-05-05 MED ORDER — DEXAMETHASONE SODIUM PHOSPHATE 10 MG/ML IJ SOLN
INTRAMUSCULAR | Status: DC | PRN
  Administered 2019-05-05: 5 mg via INTRAVENOUS

## 2019-05-05 MED ORDER — ONDANSETRON HCL 4 MG/2ML IJ SOLN: 4.0000 mg | Freq: Four times a day (QID) | INTRAMUSCULAR | Status: DC | PRN

## 2019-05-05 MED ORDER — CHLORHEXIDINE GLUCONATE CLOTH 2 % EX PADS: 6.0000 | MEDICATED_PAD | Freq: Once | CUTANEOUS | Status: DC

## 2019-05-05 MED ORDER — MIDAZOLAM HCL 2 MG/2ML IJ SOLN
INTRAMUSCULAR | Status: AC
  Filled 2019-05-05: qty 2

## 2019-05-05 MED ORDER — BUPIVACAINE HCL (PF) 0.5 % IJ SOLN
INTRAMUSCULAR | Status: AC
  Filled 2019-05-05: qty 30

## 2019-05-05 MED ORDER — GABAPENTIN 600 MG PO TABS
600.0000 mg | ORAL_TABLET | Freq: Every day | ORAL | Status: DC
  Administered 2019-05-05 – 2019-05-11 (×7): 600 mg via ORAL
  Filled 2019-05-05 (×7): qty 1

## 2019-05-05 MED ORDER — SODIUM CHLORIDE 0.9 % IV SOLN: INTRAVENOUS | Status: DC

## 2019-05-05 MED ORDER — PANTOPRAZOLE SODIUM 40 MG PO TBEC
40.0000 mg | DELAYED_RELEASE_TABLET | Freq: Every day | ORAL | Status: DC
  Administered 2019-05-05 – 2019-05-12 (×8): 40 mg via ORAL
  Filled 2019-05-05 (×8): qty 1

## 2019-05-05 MED ORDER — ACETAMINOPHEN 10 MG/ML IV SOLN
INTRAVENOUS | Status: DC | PRN
  Administered 2019-05-05: 1000 mg via INTRAVENOUS

## 2019-05-05 MED ORDER — EPHEDRINE SULFATE 50 MG/ML IJ SOLN
INTRAMUSCULAR | Status: DC | PRN
  Administered 2019-05-05: 10 mg via INTRAVENOUS

## 2019-05-05 MED ORDER — ISOSORBIDE MONONITRATE ER 30 MG PO TB24
30.0000 mg | ORAL_TABLET | Freq: Two times a day (BID) | ORAL | Status: DC
  Administered 2019-05-05 – 2019-05-12 (×14): 30 mg via ORAL
  Filled 2019-05-05 (×14): qty 1

## 2019-05-05 MED ORDER — CLINDAMYCIN PHOSPHATE 300 MG/50ML IV SOLN
300.0000 mg | Freq: Three times a day (TID) | INTRAVENOUS | Status: AC
  Administered 2019-05-05 – 2019-05-06 (×3): 300 mg via INTRAVENOUS
  Filled 2019-05-05 (×4): qty 50

## 2019-05-05 MED ORDER — ISOSORBIDE MONONITRATE ER 60 MG PO TB24
60.0000 mg | ORAL_TABLET | Freq: Two times a day (BID) | ORAL | Status: DC
  Administered 2019-05-05 – 2019-05-12 (×14): 60 mg via ORAL
  Filled 2019-05-05 (×16): qty 1

## 2019-05-05 MED ORDER — VITAMIN B-12 1000 MCG PO TABS
1000.0000 ug | ORAL_TABLET | Freq: Every day | ORAL | Status: DC
  Administered 2019-05-05 – 2019-05-12 (×8): 1000 ug via ORAL
  Filled 2019-05-05 (×8): qty 1

## 2019-05-05 SURGICAL SUPPLY — 55 items
APL PRP STRL LF DISP 70% ISPRP (MISCELLANEOUS) ×1
BAG COUNTER SPONGE EZ (MISCELLANEOUS) ×1 IMPLANT
BANDAGE ELASTIC 4 LF NS (GAUZE/BANDAGES/DRESSINGS) ×2 IMPLANT
BLADE SAGITTAL WIDE XTHICK NO (BLADE) ×2 IMPLANT
BLADE SURG SZ10 CARB STEEL (BLADE) ×2 IMPLANT
BNDG COHESIVE 4X5 TAN STRL (GAUZE/BANDAGES/DRESSINGS) ×2 IMPLANT
BNDG GAUZE 4.5X4.1 6PLY STRL (MISCELLANEOUS) ×2 IMPLANT
BRUSH SCRUB EZ  4% CHG (MISCELLANEOUS) ×1
BRUSH SCRUB EZ 4% CHG (MISCELLANEOUS) ×1 IMPLANT
CANISTER SUCT 1200ML W/VALVE (MISCELLANEOUS) ×2 IMPLANT
CANISTER WOUND CARE 500ML ATS (WOUND CARE) IMPLANT
CHLORAPREP W/TINT 26 (MISCELLANEOUS) ×2 IMPLANT
COVER WAND RF STERILE (DRAPES) ×2 IMPLANT
DERMABOND ADVANCED (GAUZE/BANDAGES/DRESSINGS)
DERMABOND ADVANCED .7 DNX12 (GAUZE/BANDAGES/DRESSINGS) ×2 IMPLANT
DRAPE INCISE IOBAN 66X45 STRL (DRAPES) ×2 IMPLANT
DRSG GAUZE FLUFF 36X18 (GAUZE/BANDAGES/DRESSINGS) ×1 IMPLANT
DRSG TEGADERM 8X12 (GAUZE/BANDAGES/DRESSINGS) ×1 IMPLANT
DRSG VAC ATS MED SENSATRAC (GAUZE/BANDAGES/DRESSINGS) IMPLANT
ELECT CAUTERY BLADE 6.4 (BLADE) ×2 IMPLANT
ELECT REM PT RETURN 9FT ADLT (ELECTROSURGICAL) ×2
ELECTRODE REM PT RTRN 9FT ADLT (ELECTROSURGICAL) ×1 IMPLANT
GAUZE XEROFORM 1X8 LF (GAUZE/BANDAGES/DRESSINGS) ×2 IMPLANT
GLOVE BIO SURGEON STRL SZ7 (GLOVE) ×2 IMPLANT
GLOVE INDICATOR 7.5 STRL GRN (GLOVE) ×2 IMPLANT
GLOVE SURG SYN 8.0 (GLOVE) ×2 IMPLANT
GLOVE SURG SYN 8.0 PF PI (GLOVE) ×1 IMPLANT
GOWN STRL REUS W/ TWL LRG LVL3 (GOWN DISPOSABLE) ×2 IMPLANT
GOWN STRL REUS W/ TWL XL LVL3 (GOWN DISPOSABLE) ×1 IMPLANT
GOWN STRL REUS W/TWL LRG LVL3 (GOWN DISPOSABLE) ×2
GOWN STRL REUS W/TWL XL LVL3 (GOWN DISPOSABLE) ×1
HANDLE YANKAUER SUCT BULB TIP (MISCELLANEOUS) ×2 IMPLANT
KIT TURNOVER KIT A (KITS) ×2 IMPLANT
LABEL OR SOLS (LABEL) ×2 IMPLANT
NS IRRIG 500ML POUR BTL (IV SOLUTION) ×2 IMPLANT
PACK EXTREMITY ARMC (MISCELLANEOUS) ×2 IMPLANT
PAD ABD DERMACEA PRESS 5X9 (GAUZE/BANDAGES/DRESSINGS) ×2 IMPLANT
PAD PREP 24X41 OB/GYN DISP (PERSONAL CARE ITEMS) ×1 IMPLANT
SPONGE LAP 18X18 RF (DISPOSABLE) ×4 IMPLANT
STAPLER SKIN PROX 35W (STAPLE) IMPLANT
STOCKINETTE M/LG 89821 (MISCELLANEOUS) ×2 IMPLANT
SUT MNCRL 4-0 (SUTURE) ×2
SUT MNCRL 4-0 27XMFL (SUTURE) ×1
SUT SILK 2 0 (SUTURE) ×1
SUT SILK 2-0 18XBRD TIE 12 (SUTURE) ×1 IMPLANT
SUT SILK 3 0 (SUTURE) ×1
SUT SILK 3-0 18XBRD TIE 12 (SUTURE) ×1 IMPLANT
SUT VIC AB 0 CT1 36 (SUTURE) ×12 IMPLANT
SUT VIC AB 3-0 SH 27 (SUTURE) ×6
SUT VIC AB 3-0 SH 27X BRD (SUTURE) ×2 IMPLANT
SUT VICRYL PLUS ABS 0 54 (SUTURE) ×2 IMPLANT
SUTURE MNCRL 4-0 27XMF (SUTURE) ×1 IMPLANT
SYR 20CC LL (SYRINGE) ×4 IMPLANT
TAPE UMBIL 1/8X18 RADIOPA (MISCELLANEOUS) ×2 IMPLANT
TOWEL OR 17X26 4PK STRL BLUE (TOWEL DISPOSABLE) ×3 IMPLANT

## 2019-05-05 NOTE — Anesthesia Procedure Notes (Signed)
Procedure Name: Intubation Date/Time: 05/05/2019 8:42 AM Performed by: Justus Memory, CRNA Pre-anesthesia Checklist: Patient identified, Patient being monitored, Timeout performed, Emergency Drugs available and Suction available Patient Re-evaluated:Patient Re-evaluated prior to induction Oxygen Delivery Method: Circle system utilized Preoxygenation: Pre-oxygenation with 100% oxygen Induction Type: IV induction Ventilation: Mask ventilation without difficulty Laryngoscope Size: Mac and 3 Grade View: Grade I Tube type: Oral Tube size: 7.0 mm Number of attempts: 1 Airway Equipment and Method: Stylet Placement Confirmation: ETT inserted through vocal cords under direct vision,  positive ETCO2 and breath sounds checked- equal and bilateral Secured at: 21 cm Tube secured with: Tape Dental Injury: Teeth and Oropharynx as per pre-operative assessment  Difficulty Due To: Difficulty was anticipated and Difficult Airway- due to large tongue Future Recommendations: Recommend- induction with short-acting agent, and alternative techniques readily available

## 2019-05-05 NOTE — Anesthesia Postprocedure Evaluation (Signed)
Anesthesia Post Note  Patient: Rachel Holmes  Procedure(s) Performed: AMPUTATION BELOW KNEE (Left )  Patient location during evaluation: PACU Anesthesia Type: General Level of consciousness: awake and alert Pain management: pain level controlled Vital Signs Assessment: post-procedure vital signs reviewed and stable Respiratory status: spontaneous breathing, nonlabored ventilation and respiratory function stable Cardiovascular status: blood pressure returned to baseline and stable Postop Assessment: no apparent nausea or vomiting Anesthetic complications: no     Last Vitals:  Vitals:   05/05/19 1252 05/05/19 1322  BP: (!) 149/71 (!) 155/70  Pulse: 75 73  Resp: 16 16  Temp: 36.7 C 36.5 C  SpO2: 100% 96%    Last Pain:  Vitals:   05/05/19 1322  TempSrc: Oral  PainSc:                  Durenda Hurt

## 2019-05-05 NOTE — H&P (Signed)
[facility] History & Physical Update  The patient was interviewed and re-examined.  The patient's previous History and Physical has been reviewed and is unchanged.  There is no change in the plan of care. We plan to proceed with the scheduled procedure.  [name], MD  05/05/2019, 7:18 AM

## 2019-05-05 NOTE — Anesthesia Post-op Follow-up Note (Signed)
Anesthesia QCDR form completed.        

## 2019-05-05 NOTE — Op Note (Signed)
OPERATIVE NOTE   PROCEDURE: Left below-the-knee amputation  PRE-OPERATIVE DIAGNOSIS: Left foot gangrene  POST-OPERATIVE DIAGNOSIS: same as above  SURGEON: Katha Cabal, MD  ASSISTANT(S): Algernon Huxley, MD  ANESTHESIA: general  ESTIMATED BLOOD LOSS: 600 cc  FINDING(S): Muscle bellies pink and reactive  SPECIMEN(S):  Left below-the-knee amputation  INDICATIONS:   Rachel Holmes is a 58 y.o. female who presents with left leg gangrene.  The patient is scheduled for a left below-the-knee amputation.  I discussed in depth with the patient the risks, benefits, and alternatives to this procedure.  The patient is aware that the risk of this operation included but are not limited to:  bleeding, infection, myocardial infarction, stroke, death, failure to heal amputation wound, and possible need for more proximal amputation.  The patient is aware of the risks and agrees proceed forward with the procedure.  DESCRIPTION:  After full informed written consent was obtained from the patient, the patient was brought back to the operating room, and placed supine upon the operating table.  Prior to induction, the patient received IV antibiotics.  The patient was then prepped and draped in the standard fashion for a below-the-knee amputation.  After obtaining adequate anesthesia, the patient was prepped and draped in the standard fashion for a left below-the-knee amputation.    A first assistant is required in order to allow for a safe and more efficient operation.  Duties include retraction of tissues to allow for optimal exposure, assisting with suture ligation of vessels as well as maintaining a clear field of view with suction as needed.  Further duties include assisting with patient positioning during the surgery as well as wound closure.  I believe that this procedure requires a first assistant in order for it to be performed at a level in keeping with the high standards of this  institution.  I marked out the anterior incision two finger breadths below the tibial tuberosity and then the marked out a posterior flap that was one third of the circumference of the calf in length.   I made the incisions for these flaps, and then dissected through the subcutaneous tissue, fascia, and muscle anteriorly.  I elevated  the periosteal tissue superiorly so that the tibia was about 3-4 cm shorter than the anterior skin flap.  I then transected the tibia with a power saw and then took a wedge off the tibia anteriorly with the power saw.  Then I smoothed out the rough edges.  In a similar fashion, I cut back the fibula about two centimeters higher than the level of the tibia with a bone cutter.  I put a bone hook into the distal tibia and then used a large amputation knife to sharply develop a tissue plane through the muscle along the fibula.  In such fashion, the posterior flap was developed.  At this point, the specimen was passed off the field as the below-the-knee amputation.  At this point, I clamped all visibly bleeding arteries and veins using a combination of suture ligation with Silk suture and electrocautery.  Bleeding continued to be controlled with electrocautery and suture ligature.  The stump was washed off with sterile normal saline and no further active bleeding was noted.  I reapproximated the anterior and posterior fascia  with interrupted stitches of 0 Vicryl.  This was completed along the entire length of anterior and posterior fascia until there were no more loose space in the fascial line. I then placed a layer of 2-0  Vicryl sutures in the subcutaneous tissue. The skin was then  reapproximated with 4-0 Monocryl subcuticular.  The stump was washed off and dried.  The incision was dressed with Xeroform and  then fluffs were applied.  Kerlix was wrapped around the leg and then gently an ACE wrap was applied.    COMPLICATIONS: none  CONDITION: stable   Rachel Holmes  05/05/2019, 11:04 AM    This note was created with Dragon Medical transcription system. Any errors in dictation are purely unintentional.

## 2019-05-05 NOTE — Transfer of Care (Signed)
Immediate Anesthesia Transfer of Care Note  Patient: Rachel Holmes  Procedure(s) Performed: AMPUTATION BELOW KNEE (Left )  Patient Location: PACU  Anesthesia Type:General  Level of Consciousness: sedated  Airway & Oxygen Therapy: Patient Spontanous Breathing  Post-op Assessment: Report given to RN and Post -op Vital signs reviewed and stable  Post vital signs: Reviewed and stable  Last Vitals:  Vitals Value Taken Time  BP 126/55 05/05/19 1058  Temp 36.4 C 05/05/19 1058  Pulse 72 05/05/19 1102  Resp 19 05/05/19 1102  SpO2 99 % 05/05/19 1102  Vitals shown include unvalidated device data.  Last Pain:  Vitals:   05/05/19 0718  TempSrc: Temporal  PainSc: 0-No pain         Complications: No apparent anesthesia complications

## 2019-05-05 NOTE — Anesthesia Preprocedure Evaluation (Addendum)
Anesthesia Evaluation  Patient identified by MRN, date of birth, ID band Patient awake    Reviewed: Allergy & Precautions, H&P , NPO status , Patient's Chart, lab work & pertinent test results  Airway Mallampati: II  TM Distance: >3 FB     Dental  (+) Edentulous Upper, Edentulous Lower   Pulmonary shortness of breath,           Cardiovascular hypertension, +CHF (preserved EF on TEE Feb 2020)       Neuro/Psych PSYCHIATRIC DISORDERS Anxiety polyneuropathy    GI/Hepatic Neg liver ROS, GERD  ,  Endo/Other  diabetesMorbid obesity (BMI 47)  Renal/GU CRFRenal disease     Musculoskeletal   Abdominal   Peds  Hematology  (+) Blood dyscrasia, anemia ,   Anesthesia Other Findings Past Medical History: No date: Allergic rhinitis No date: Anemia No date: Anxiety No date: Arthritis No date: Back pain No date: Bradycardia No date: Breast mass     Comment:  Patient can no longer palpate specific masses but showed              tech general area of concern No date: CHF (congestive heart failure) (HCC) No date: CKD (chronic kidney disease)     Comment:  STAGE 3 No date: Constipation No date: Diabetes mellitus without complication (HCC) No date: Diabetic nephropathy (HCC) No date: Dyspnea No date: GERD (gastroesophageal reflux disease) No date: Heart murmur No date: HLD (hyperlipidemia) No date: Hyperparathyroidism ([place]) No date: Hypertension No date: Joint pain No date: Leg edema No date: Legally blind in left eye, as defined in [place] No date: Lymphedema No date: Obesity No date: Onychomycosis  Past Surgical History: No date: ABDOMINAL HYSTERECTOMY 2014: BREAST BIOPSY; Left     Comment:  FNA 12:00 position - Negative 2007: EYE SURGERY; Left     Comment:  removed a lens, no lens implanted 12/29/2018: IR FLUORO GUIDE CV LINE RIGHT 02/19/2019: IR REMOVAL TUN CV CATH W/O FL 12/29/2018: IR US GUIDE VASC ACCESS  RIGHT 12/28/2018: TEE WITHOUT CARDIOVERSION; N/A     Comment:  Procedure: TRANSESOPHAGEAL ECHOCARDIOGRAM (TEE);                Surgeon: Sueanne Margarita, MD;  Location: [facility] ENDOSCOPY;                Service: Cardiovascular;  Laterality: N/A; No date: VAGINAL HYSTERECTOMY     Comment:  abdominal hyst, not a vaginal hyst     Reproductive/Obstetrics negative OB ROS                           Anesthesia Physical Anesthesia Plan  ASA: III  Anesthesia Plan: General ETT   Post-op Pain Management:    Induction:   PONV Risk Score and Plan: Ondansetron, Dexamethasone, Midazolam and Treatment may vary due to age or medical condition  Airway Management Planned:   Additional Equipment:   Intra-op Plan:   Post-operative Plan:   Informed Consent: I have reviewed the patients History and Physical, chart, labs and discussed the procedure including the risks, benefits and alternatives for the proposed anesthesia with the patient or authorized representative who has indicated his/her understanding and acceptance.     Dental Advisory Given  Plan Discussed with: Anesthesiologist and CRNA  Anesthesia Plan Comments:         Anesthesia Quick Evaluation

## 2019-05-06 ENCOUNTER — Ambulatory Visit: Payer: Medicare HMO | Admitting: Endocrinology

## 2019-05-06 DIAGNOSIS — Z89512 Acquired absence of left leg below knee: Secondary | ICD-10-CM

## 2019-05-06 LAB — MAGNESIUM: Magnesium: 1.9 mg/dL (ref 1.7–2.4)

## 2019-05-06 LAB — CBC
HCT: 24.7 % — ABNORMAL LOW (ref 36.0–46.0)
Hemoglobin: 8.1 g/dL — ABNORMAL LOW (ref 12.0–15.0)
MCH: 29.6 pg (ref 26.0–34.0)
MCHC: 32.8 g/dL (ref 30.0–36.0)
MCV: 90.1 fL (ref 80.0–100.0)
Platelets: 174 10*3/uL (ref 150–400)
RBC: 2.74 MIL/uL — ABNORMAL LOW (ref 3.87–5.11)
RDW: 13 % (ref 11.5–15.5)
WBC: 11 10*3/uL — ABNORMAL HIGH (ref 4.0–10.5)
nRBC: 0 % (ref 0.0–0.2)

## 2019-05-06 LAB — BASIC METABOLIC PANEL
Anion gap: 10 (ref 5–15)
BUN: 61 mg/dL — ABNORMAL HIGH (ref 6–20)
CO2: 23 mmol/L (ref 22–32)
Calcium: 7.8 mg/dL — ABNORMAL LOW (ref 8.9–10.3)
Chloride: 103 mmol/L (ref 98–111)
Creatinine, Ser: 4.22 mg/dL — ABNORMAL HIGH (ref 0.44–1.00)
GFR calc Af Amer: 13 mL/min — ABNORMAL LOW (ref >60)
GFR calc non Af Amer: 11 mL/min — ABNORMAL LOW (ref >60)
Glucose, Bld: 238 mg/dL — ABNORMAL HIGH (ref 70–99)
Potassium: 5 mmol/L (ref 3.5–5.1)
Sodium: 136 mmol/L (ref 135–145)

## 2019-05-06 LAB — GLUCOSE, CAPILLARY
Glucose-Capillary: 263 mg/dL — ABNORMAL HIGH (ref 70–99)
Glucose-Capillary: 269 mg/dL — ABNORMAL HIGH (ref 70–99)
Glucose-Capillary: 208 mg/dL — ABNORMAL HIGH (ref 70–99)
Glucose-Capillary: 155 mg/dL — ABNORMAL HIGH (ref 70–99)

## 2019-05-06 LAB — HEMOGLOBIN A1C
Hgb A1c MFr Bld: 7.3 % — ABNORMAL HIGH (ref 4.8–5.6)
Mean Plasma Glucose: 162.81 mg/dL

## 2019-05-06 MED ORDER — INSULIN DETEMIR 100 UNIT/ML ~~LOC~~ SOLN
9.0000 [IU] | Freq: Every day | SUBCUTANEOUS | Status: DC
  Administered 2019-05-06 – 2019-05-10 (×3): 9 [IU] via SUBCUTANEOUS
  Filled 2019-05-06 (×6): qty 0.09

## 2019-05-06 MED ORDER — SODIUM CHLORIDE 0.9 % IV SOLN
INTRAVENOUS | Status: AC
  Administered 2019-05-06: 11:00:00 via INTRAVENOUS

## 2019-05-06 MED ORDER — INSULIN ASPART 100 UNIT/ML ~~LOC~~ SOLN: 10.0000 [IU] | SUBCUTANEOUS | Status: DC

## 2019-05-06 MED ORDER — INSULIN ASPART 100 UNIT/ML ~~LOC~~ SOLN
0.0000 [IU] | Freq: Three times a day (TID) | SUBCUTANEOUS | Status: DC
  Administered 2019-05-06: 11 [IU] via SUBCUTANEOUS
  Filled 2019-05-06: qty 1

## 2019-05-06 MED ORDER — ENSURE MAX PROTEIN PO LIQD
11.0000 [oz_av] | Freq: Two times a day (BID) | ORAL | Status: DC
  Administered 2019-05-06 – 2019-05-11 (×11): 11 [oz_av] via ORAL
  Filled 2019-05-06: qty 330

## 2019-05-06 MED ORDER — INSULIN ASPART 100 UNIT/ML ~~LOC~~ SOLN
0.0000 [IU] | Freq: Every day | SUBCUTANEOUS | Status: DC
  Administered 2019-05-07 – 2019-05-08 (×2): 2 [IU] via SUBCUTANEOUS
  Administered 2019-05-09: 3 [IU] via SUBCUTANEOUS
  Filled 2019-05-06 (×3): qty 1

## 2019-05-06 MED ORDER — VITAMIN C 500 MG PO TABS
250.0000 mg | ORAL_TABLET | Freq: Two times a day (BID) | ORAL | Status: DC
  Administered 2019-05-06 – 2019-05-09 (×7): 250 mg via ORAL
  Administered 2019-05-10: 500 mg via ORAL
  Administered 2019-05-10 – 2019-05-12 (×4): 250 mg via ORAL
  Filled 2019-05-06 (×12): qty 1

## 2019-05-06 MED ORDER — INSULIN ASPART 100 UNIT/ML ~~LOC~~ SOLN
3.0000 [IU] | Freq: Three times a day (TID) | SUBCUTANEOUS | Status: DC
  Administered 2019-05-06 – 2019-05-11 (×15): 3 [IU] via SUBCUTANEOUS
  Filled 2019-05-06 (×14): qty 1

## 2019-05-06 MED ORDER — INSULIN ASPART 100 UNIT/ML ~~LOC~~ SOLN
0.0000 [IU] | Freq: Three times a day (TID) | SUBCUTANEOUS | Status: DC
  Administered 2019-05-06 – 2019-05-07 (×3): 3 [IU] via SUBCUTANEOUS
  Administered 2019-05-07: 5 [IU] via SUBCUTANEOUS
  Administered 2019-05-08 – 2019-05-09 (×5): 3 [IU] via SUBCUTANEOUS
  Administered 2019-05-09 – 2019-05-10 (×2): 2 [IU] via SUBCUTANEOUS
  Administered 2019-05-10: 1 [IU] via SUBCUTANEOUS
  Administered 2019-05-10 – 2019-05-11 (×2): 3 [IU] via SUBCUTANEOUS
  Administered 2019-05-11: 2 [IU] via SUBCUTANEOUS
  Administered 2019-05-11: 1 [IU] via SUBCUTANEOUS
  Administered 2019-05-12: 3 [IU] via SUBCUTANEOUS
  Administered 2019-05-12: 1 [IU] via SUBCUTANEOUS
  Filled 2019-05-06 (×18): qty 1

## 2019-05-06 MED ORDER — INSULIN ASPART 100 UNIT/ML ~~LOC~~ SOLN: 0.0000 [IU] | Freq: Every day | SUBCUTANEOUS | Status: DC

## 2019-05-06 MED ORDER — DULAGLUTIDE 1.5 MG/0.5ML ~~LOC~~ SOAJ
1.5000 mg | SUBCUTANEOUS | Status: DC
  Administered 2019-05-09: 1.5 mg via SUBCUTANEOUS
  Filled 2019-05-06: qty 1

## 2019-05-06 NOTE — TOC Initial Note (Addendum)
Transition of Care [facility]) - Initial/Assessment Note    Patient Details  Name: Rachel Holmes MRN: [id number] Date of Birth: January 14, 1961  Transition of Care [facility]) CM/SW Contact:    Beverly Sessions, RN Phone Number: 05/06/2019, 3:00 PM  Clinical Narrative:                 POD 1 BKA  Patient states that she lives at home with her ex husband.  Ex husband is primary support system and provides transportation to all appointments.  Patient states that her son, niece, sister live locally for support.   Patient has been open with [facility] in the past for home IV antibitoics.   PCP [facility].  Patient denies issues obtaining medication.  Patient states that she has a manual WC, cane, and BSC in the home.   PT has assessed patient and recommending CIR.   Patient states "I don't want to go there, I want to go to [facility]".  RNCM explained the differences and benefits of considering CIR in her situation.  Patient still declines CIR at this time and wishes to purse SNF (specifically [facility])  PASSR initiated.  Bed searh initiated.  Magda Paganini at [facility] that patient is requesting their facility.   Expected Discharge Plan: Skilled Nursing Facility Barriers to Discharge: Continued Medical Work up   Patient Goals and CMS Choice        Expected Discharge Plan and Services Expected Discharge Plan: [place]   Discharge Planning Services: CM Consult   Living arrangements for the past 2 months: [place]                                      Prior Living Arrangements/Services Living arrangements for the past 2 months: [place] Lives with:: Other (Comment)(ex husband)   Do you feel safe going back to the place where you live?: Yes      Need for Family Participation in Patient Care: Yes (Comment)     Criminal Activity/Legal Involvement Pertinent to Current  Situation/Hospitalization: No - Comment as needed  Activities of Daily Living Home Assistive Devices/Equipment: Cane (specify quad or straight), Blood pressure cuff, CBG Meter, Dentures (specify type), Bedside commode/3-in-1, Wheelchair ADL Screening (condition at time of admission) Patient's cognitive ability adequate to safely complete daily activities?: Yes Is the patient deaf or have difficulty hearing?: No Does the patient have difficulty seeing, even when wearing glasses/contacts?: Yes Does the patient have difficulty concentrating, remembering, or making decisions?: No Patient able to express need for assistance with ADLs?: Yes Does the patient have difficulty dressing or bathing?: No Independently performs ADLs?: Yes (appropriate for developmental age) Does the patient have difficulty walking or climbing stairs?: Yes Weakness of Legs: Both Weakness of Arms/Hands: Both  Permission Sought/Granted                  Emotional Assessment Appearance:: Appears stated age Attitude/Demeanor/Rapport: Gracious Affect (typically observed): Accepting Orientation: : Oriented to Self, Oriented to Place, Oriented to  Time, Oriented to Situation   Psych Involvement: No (comment)  Admission diagnosis:  CHARCOT OF ANKLE OSTEOMYELITIS Patient Active Problem List   Diagnosis Date Noted  . Chronic osteomyelitis of ankle and foot, left ([place]) 05/05/2019  . Chronic diastolic CHF (congestive heart failure) ([place]) 04/19/2019  . Leg edema 04/19/2019  . Acute bacterial endocarditis 04/19/2019  .  Chest pain of uncertain etiology 66/44/0347  . Ulcer of left ankle ([place]) 04/14/2019  . Aortic valve endocarditis   . Bacteremia due to Streptococcus 12/24/2018  . Malignant hypertension (arteriolar nephrosclerosis), stage 1-4 or unspecified chronic kidney disease 07/15/2018  . Stage 4 chronic kidney disease ([place]) 07/15/2018  . Morbid obesity with BMI of 45.0-49.9, adult ([place]) 07/15/2018  . Excessive  daytime sleepiness 07/15/2018  . Other fatigue 03/11/2018  . Essential hypertension 03/11/2018  . Vitamin D deficiency 03/11/2018  . Congestive heart failure ([place]) 03/11/2018  . Degenerative arthritis of right knee 01/06/2018  . Uncontrolled type 2 diabetes mellitus with polyneuropathy ([place]) 10/03/2017  . Knee pain 10/02/2017  . Obesity 10/02/2017  . Charcot's joint of left foot 08/29/2017  . Lymphedema 09/06/2016  . Hyponatremia with extracellular fluid depletion 02/02/2016  . Nausea with vomiting, unspecified 02/02/2016  . Closed nondisplaced fracture of right patella 08/23/2015  . Acute cystitis without hematuria 08/14/2014  . Colitis 08/14/2014  . HTN (hypertension), malignant 08/14/2014  . Acute renal failure superimposed on stage 3 chronic kidney disease ([place]) 06/26/2014  . Hyperparathyroidism, unspecified ([place]) 02/24/2014  . Allergic rhinitis 02/22/2014  . Onychomycosis 02/22/2014  . Anxiety 11/05/2013  . Bradycardia 05/14/2013  . Gastro-esophageal reflux disease without esophagitis 04/05/2013  . Anemia in other chronic diseases classified elsewhere 01/27/2013   PCP:  Sharyne Peach, MD Pharmacy:   [facility] [place] [facility] [address] [place])  [address] Phone: [phone] Fax: [phone]     Social Determinants of Health (SDOH) Interventions    Readmission Risk Interventions Readmission Risk Prevention Plan 05/06/2019  Transportation Screening Complete  Medication Review (RN Care Manager) Complete  Some recent data might be hidden

## 2019-05-06 NOTE — Progress Notes (Signed)
Initial Nutrition Assessment  DOCUMENTATION CODES:   Morbid obesity  INTERVENTION:   Ensure Max protein supplement BID, each supplement provides 150kcal and 30g of protein.  MVI daily   Vitamin C 250mg  po BID   Diabetes diet education   NUTRITION DIAGNOSIS:   Increased nutrient needs related to wound healing as evidenced by increased estimated needs.  GOAL:   Patient will meet greater than or equal to 90% of their needs  MONITOR:   PO intake, Supplement acceptance, Labs, Weight trends, Skin, I & O's  REASON FOR ASSESSMENT:   Consult Assessment of nutrition requirement/status  ASSESSMENT:   58 y/o female with h/o DM, CHF, CKD IV admitted with L foot osteomyelitis now s/p L BKA  RD working remotely.  Pt with increased estimated needs r/t wound healing. Spoke with pt via phone. Pt reports good appetite and oral intake pta and today. Pt eating 100% of meals in hospital. RD will add supplements and vitamins to support wound healing. Pt would like vanilla flavors. Per chart, pt is weight stable pta. RD discussed diabetic diet education with pt and how this relates to wound healing.   RD provided "Nutrition and Type II Diabetes" handout from the Academy of [employer] and [employer]etics via mail. Discussed different food groups and their effects on blood sugar, emphasizing carbohydrate-containing foods. Provided list of carbohydrates and recommended serving sizes of common foods.  Discussed importance of controlled and consistent carbohydrate intake throughout the day. Provided examples of ways to balance meals/snacks and encouraged intake of high-fiber, whole grain complex carbohydrates. Teach back method used.  Medications reviewed and include: aspirin, colace, lovenox, insulin, MVI, protonix, Na bicarb, torsemide, B12  Labs reviewed: K 5.0 wnl, Mg 1.9 wnl Wbc- 11.0(H), Hgb 8.1(L), Hct 24.7(L) cbgs- 190, 196, 348, 274, 263 x 24 hrs AIC 8.4- 3/20  Unable to complete  Nutrition-Focused physical exam at this time.   Diet Order:   Diet Order            Diet Carb Modified Fluid consistency: Thin; Room service appropriate? Yes  Diet effective now             EDUCATION NEEDS:   Education needs have been addressed  Skin:  Skin Assessment: Reviewed RN Assessment(Incision L leg)  Last BM:  6/29  Height:   Ht Readings from Last 1 Encounters:  05/05/19 5\' 8"  (1.727 m)    Weight:   Wt Readings from Last 1 Encounters:  05/06/19 (!) 136.2 kg    Ideal Body Weight:  63.6 kg  BMI:  Body mass index is 45.65 kg/m.  Estimated Nutritional Needs:   Kcal:  2300-2600kcal/day  Protein:  115-130g/day  Fluid:  >1.9L/day  Koleen Distance MS, RD, LDN Pager #- [phone] Office#- [phone] After Hours Pager: [phone]

## 2019-05-06 NOTE — Evaluation (Addendum)
Physical Therapy Evaluation Patient Details Name: Rachel Holmes MRN: [id number] DOB: [birth year] Today's Date: 05/06/2019   History of Present Illness  Pt admitted for chronic osteomyelitis of L foot resulting in L BKA on 05/05/19.  Clinical Impression  Pt is a pleasant 58 year old female who was admitted for L BKA. Pt performs bed mobility with min/mod assist and unable to further perform mobility due to fatigue/weakness. Pt drowsy, however arousable. Pt demonstrates deficits with strength/mobility. Pt also demonstrates significant strength deficits in R LE from over use. Currently not at baseline level. Discussed keeping L knee extended, pt hopeful for prothesis. Also discussed desensitization techniques. Would benefit from skilled PT to address above deficits and promote optimal return to PLOF. Recommend transition to CIR if pt able to tolerate intensity.    Follow Up Recommendations CIR    Equipment Recommendations  None recommended by PT    Recommendations for Other Services       Precautions / Restrictions Precautions Precautions: Fall Restrictions Weight Bearing Restrictions: Yes LLE Weight Bearing: Non weight bearing      Mobility  Bed Mobility Overal bed mobility: Needs Assistance Bed Mobility: Supine to Sit     Supine to sit: Min assist;Mod assist     General bed mobility comments: able to intiate mobility. Prefers to transfer towards L side. Once seated, able to sit with upright posture, fatigues quickly only able to tolerate sitting a few minutes. Request to return transitioning back to supine  Transfers                 General transfer comment: not performed due to fatigue  Ambulation/Gait                Stairs            Wheelchair Mobility    Modified Rankin (Stroke Patients Only)       Balance Overall balance assessment: Needs assistance Sitting-balance support: Feet supported;Bilateral upper extremity supported Sitting  balance-Leahy Scale: Fair                                       Pertinent Vitals/Pain Pain Assessment: Faces Faces Pain Scale: Hurts a little bit Pain Location: L residual limb Pain Descriptors / Indicators: Operative site guarding Pain Intervention(s): Limited activity within patient's tolerance;Premedicated before session;Repositioned    Home Living Family/patient expects to be discharged to:: Private residence Living Arrangements: Spouse/significant other Available Help at Discharge: Family;Available 24 hours/day Type of Home: House Home Access: Stairs to enter Entrance Stairs-Rails: None Entrance Stairs-Number of Steps: 1 Home Layout: One level Home Equipment: Walker - 2 wheels;Wheelchair - manual;Bedside commode      Prior Function Level of Independence: Independent with assistive device(s)         Comments: has been independent with WC level. Able to ambulate about 53' at one time. Reports no falls     Hand Dominance        Extremity/Trunk Assessment   Upper Extremity Assessment Upper Extremity Assessment: Generalized weakness(B UE grossly 4/5)    Lower Extremity Assessment Lower Extremity Assessment: Generalized weakness(R LE grossly 3+/5; L LE grossly 3/5)       Communication   Communication: No difficulties  Cognition Arousal/Alertness: Awake/alert Behavior During Therapy: WFL for tasks assessed/performed Overall Cognitive Status: Within Functional Limits for tasks assessed  General Comments General comments (skin integrity, edema, etc.): educated on positioning of residual limb in bed to promote extension.    Exercises Other Exercises Other Exercises: Supine ther-ex performed on B LE including SLRs, quad sets, and hip abd/add x 10 reps. Min assist required.    Assessment/Plan    PT Assessment Patient needs continued PT services  PT Problem List Decreased strength;Decreased  activity tolerance;Decreased balance;Decreased mobility;Pain;Obesity       PT Treatment Interventions DME instruction;Gait training;Therapeutic activities;Therapeutic exercise;Balance training;Wheelchair mobility training    PT Goals (Current goals can be found in the Care Plan section)  Acute Rehab PT Goals Patient Stated Goal: to get stronger PT Goal Formulation: With patient Time For Goal Achievement: 05/20/19 Potential to Achieve Goals: Good    Frequency 7X/week   Barriers to discharge        Co-evaluation               AM-PAC PT "6 Clicks" Mobility  Outcome Measure Help needed turning from your back to your side while in a flat bed without using bedrails?: A Little Help needed moving from lying on your back to sitting on the side of a flat bed without using bedrails?: A Lot Help needed moving to and from a bed to a chair (including a wheelchair)?: Total Help needed standing up from a chair using your arms (e.g., wheelchair or bedside chair)?: Total Help needed to walk in hospital room?: Total Help needed climbing 3-5 steps with a railing? : Total 6 Click Score: 9    End of Session   Activity Tolerance: Patient tolerated treatment well Patient left: in bed;with bed alarm set;with SCD's reapplied Nurse Communication: Mobility status PT Visit Diagnosis: Muscle weakness (generalized) (M62.81);Difficulty in walking, not elsewhere classified (R26.2);Pain Pain - Right/Left: Left Pain - part of body: Leg    Time: 9450-3888 PT Time Calculation (min) (ACUTE ONLY): 20 min   Charges:   PT Evaluation $PT Eval Low Complexity: 1 Low PT Treatments $Therapeutic Exercise: 8-22 mins        Greggory Stallion, PT, DPT [phone]   Karleigh Bunte 05/06/2019, 1:37 PM

## 2019-05-06 NOTE — Evaluation (Signed)
Occupational Therapy Evaluation Patient Details Name: Rachel Holmes MRN: [id number] DOB: [birth year] Today's Date: 05/06/2019    History of Present Illness Pt admitted for chronic osteomyelitis of L foot resulting in L BKA on 05/05/19.   Clinical Impression   Pt seen for OT evaluation this date. Pt eager to participate but fatigued. Pt apologized and attempted to participate as best she could but ultimately fatigued and will likely improve with performance with time to rest. Pt lives with her ex-spouse in a 1st floor apartment with tub/shower. Pt indep at w/c level at baseline, using RW for brief household distances, and requires assist for tub transfers. Pt reports having a "bench/stool" for the shower that she uses. Pt presents with residual limb and phantom pain in LLE, impaired strength in general, and impaired balance, requiring min-mod assist for seated LB ADL tasks. Pt educated in desensitization strategies, phantom pain mgt, and positioning of residual limb. Pt verbalized understanding. Pt will benefit from skilled, high-intensity OT services to maximize safety and return to PLOF with improved independence. Recommend CIR, pending pt's ability to tolerate.     Follow Up Recommendations  CIR    Equipment Recommendations  3 in 1 bedside commode    Recommendations for Other Services       Precautions / Restrictions Precautions Precautions: Fall Restrictions Weight Bearing Restrictions: Yes LLE Weight Bearing: Non weight bearing      Mobility Bed Mobility Overal bed mobility: Needs Assistance Bed Mobility: Supine to Sit     Supine to sit: Min assist;Mod assist     General bed mobility comments: able to intiate mobility. Prefers to transfer towards L side. Once seated, able to sit with upright posture, fatigues quickly only able to tolerate sitting a few minutes. Request to return transitioning back to supine  Transfers                 General transfer comment: not  performed due to fatigue    Balance Overall balance assessment: Needs assistance Sitting-balance support: Feet supported;Bilateral upper extremity supported Sitting balance-Leahy Scale: Fair                                     ADL either performed or assessed with clinical judgement   ADL Overall ADL's : Needs assistance/impaired Eating/Feeding: Independent;Sitting   Grooming: Sitting;Independent   Upper Body Bathing: Sitting;Set up;Supervision/ safety   Lower Body Bathing: Sitting/lateral leans;Minimal assistance;Moderate assistance   Upper Body Dressing : Sitting;Set up;Supervision/safety   Lower Body Dressing: Sitting/lateral leans;Minimal assistance;Moderate assistance                       Vision Baseline Vision/History: Wears glasses Wears Glasses: Reading only Patient Visual Report: No change from baseline       Perception     Praxis      Pertinent Vitals/Pain Pain Assessment: 0-10 Pain Score: 3  Faces Pain Scale: Hurts a little bit Pain Location: L residual limb Pain Descriptors / Indicators: Operative site guarding Pain Intervention(s): Limited activity within patient's tolerance;Monitored during session;Repositioned     Hand Dominance Right   Extremity/Trunk Assessment Upper Extremity Assessment Upper Extremity Assessment: Generalized weakness(grossly 4/5)   Lower Extremity Assessment Lower Extremity Assessment: Generalized weakness(R LE grossly 3+/5; L LE grossly 3/5)       Communication Communication Communication: No difficulties   Cognition Arousal/Alertness: Lethargic Behavior During Therapy: WFL for tasks assessed/performed Overall  Cognitive Status: Within Functional Limits for tasks assessed                                 General Comments: pt very drowsy, difficulty keeping eyes open   General Comments  educated on positioning of residual limb in bed to promote extension.    Exercises  Exercises: Other exercises Other Exercises Other Exercises: pt instructed in desensitization strategies to support optimal pain control as well as prepare for prosthesis use in the future Other Exercises: pt educated in strategies to support phantom pain   Shoulder Instructions      Home Living Family/patient expects to be discharged to:: Private residence Living Arrangements: Spouse/significant other(ex-spouse, per pt report) Available Help at Discharge: Family;Available PRN/intermittently Type of Home: Apartment Home Access: Stairs to enter Entrance Stairs-Number of Steps: 1 Entrance Stairs-Rails: None Home Layout: One level     Bathroom Shower/Tub: Teacher, early years/pre: Standard     Home Equipment: Environmental consultant - 2 wheels;Wheelchair - [employer];Tub bench   Additional Comments: pt reports her mobility is limited by the size of her apartment      Prior Functioning/Environment Level of Independence: Independent with assistive device(s)        Comments: has been independent with WC level. Able to ambulate about 46' at one time. Reports no falls. Pt reports assist for tub transfers but able to bathe and dress herself. 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        OT Problem List: Decreased strength;Pain;Impaired balance (sitting and/or standing);Obesity      OT Treatment/Interventions: Self-care/ADL training;Therapeutic exercise;Therapeutic activities;DME and/or AE instruction;Patient/family education;Balance training    OT Goals(Current goals can be found in the care plan section) Acute Rehab OT Goals Patient Stated Goal: to get stronger OT Goal Formulation: With patient Time For Goal Achievement: 05/20/19 Potential to Achieve Goals: Good ADL Goals Pt Will Perform Lower Body Dressing: with mod assist;sitting/lateral leans Pt Will Transfer to Toilet: stand pivot transfer;bedside commode;with min assist;with mod assist Additional ADL Goal #1: Pt will  independently demonstrate learned desensitization strategies for residual limb in preparation for future prosthesis wear.  OT Frequency: Min 3X/week   Barriers to D/C:            Co-evaluation              AM-PAC OT "6 Clicks" Daily Activity     Outcome Measure Help from another person eating meals?: None Help from another person taking care of personal grooming?: None Help from another person toileting, which includes using toliet, bedpan, or urinal?: A Lot Help from another person bathing (including washing, rinsing, drying)?: A Lot Help from another person to put on and taking off regular upper body clothing?: A Little Help from another person to put on and taking off regular lower body clothing?: A Lot 6 Click Score: 17   End of Session    Activity Tolerance: Patient limited by fatigue Patient left: in bed;with call bell/phone within reach;with bed alarm set;Other (comment)(LLE positioned on pillow to promote extension)  OT Visit Diagnosis: Other abnormalities of gait and mobility (R26.89);Muscle weakness (generalized) (M62.81);Pain Pain - Right/Left: Left Pain - part of body: Leg                Time: 8413-2440 OT Time Calculation (min): 11 min Charges:  OT General Charges $OT Visit: 1 Visit OT Evaluation $OT Eval Low Complexity: 1 Low  Jeni Salles, MPH, MS, OTR/L ascom [phone] 05/06/19, 3:35 PM

## 2019-05-06 NOTE — Progress Notes (Signed)
Inpatient Diabetes Program Recommendations  AACE/ADA: New Consensus Statement on Inpatient Glycemic Control (2015)  Target Ranges:  Prepandial:   less than 140 mg/dL      Peak postprandial:   less than 180 mg/dL (1-2 hours)      Critically ill patients:  140 - 180 mg/dL   Lab Results  Component Value Date   GLUCAP 263 (H) 05/06/2019   HGBA1C 8.4 01/22/2019    Review of Glycemic Control Results for Rachel Holmes, Rachel Holmes (MRN [id number]) as of 05/06/2019 10:39  Ref. Range 05/05/2019 07:16 05/05/2019 11:09 05/05/2019 16:35 05/05/2019 21:53 05/06/2019 07:51  Glucose-Capillary Latest Ref Range: 70 - 99 mg/dL 190 (H) 196 (H) 348 (H) 274 (H) 263 (H)   Diabetes history: DM2 Outpatient Diabetes medications: Trulicity 1.5 mg weekly + Novolog 08-18-11 units tid meal coverage Current orders for Inpatient glycemic control: Novolog resistant correction tid + hs 0-5 units + ?Truilicity weekly  Inpatient Diabetes Program Recommendations:   Noted elevated creatine & BUN. -Decrease Novolog correction to sensitive tid -Add Levemir 9 units daily (0.1 units/kg x 136.2 kg = 9) -Novolog 3 units tid meal coverage when eating 50% meals  Patient sees Dr. Loanne Drilling as her endocrinologist and last office visit was 02/19/19.  Thank you, Nani Gasser. Shreshta Medley, RN, MSN, CDE  Diabetes Coordinator Inpatient Glycemic Control Team Team Pager [phone] (8am-5pm) 05/06/2019 10:56 AM

## 2019-05-06 NOTE — NC FL2 (Signed)
[place] LEVEL OF CARE SCREENING TOOL     IDENTIFICATION  Patient Name: Rachel Holmes Birthdate: 03/22/1961 Sex: female Admission Date (Current Location): 05/05/2019  [place] and [place] Number:  Engineering geologist and Address:  [facility], [address], [place], [place] [address]      Provider Number: [id number]  Attending Physician Name and Address:  Katha Cabal, MD  Relative Name and Phone Number:       Current Level of Care: Hospital Recommended Level of Care: [place] Prior Approval Number:    Date Approved/Denied:   PASRR Number: Pending  Discharge Plan: SNF    Current Diagnoses: Patient Active Problem List   Diagnosis Date Noted  . Chronic osteomyelitis of ankle and foot, left ([place]) 05/05/2019  . Chronic diastolic CHF (congestive heart failure) ([place]) 04/19/2019  . Leg edema 04/19/2019  . Acute bacterial endocarditis 04/19/2019  . Chest pain of uncertain etiology 68/09/5725  . Ulcer of left ankle ([place]) 04/14/2019  . Aortic valve endocarditis   . Bacteremia due to Streptococcus 12/24/2018  . Malignant hypertension (arteriolar nephrosclerosis), stage 1-4 or unspecified chronic kidney disease 07/15/2018  . Stage 4 chronic kidney disease ([place]) 07/15/2018  . Morbid obesity with BMI of 45.0-49.9, adult ([place]) 07/15/2018  . Excessive daytime sleepiness 07/15/2018  . Other fatigue 03/11/2018  . Essential hypertension 03/11/2018  . Vitamin D deficiency 03/11/2018  . Congestive heart failure ([place]) 03/11/2018  . Degenerative arthritis of right knee 01/06/2018  . Uncontrolled type 2 diabetes mellitus with polyneuropathy ([place]) 10/03/2017  . Knee pain 10/02/2017  . Obesity 10/02/2017  . Charcot's joint of left foot 08/29/2017  . Lymphedema 09/06/2016  . Hyponatremia with extracellular fluid depletion 02/02/2016  . Nausea with vomiting, unspecified 02/02/2016  . Closed nondisplaced fracture of right  patella 08/23/2015  . Acute cystitis without hematuria 08/14/2014  . Colitis 08/14/2014  . HTN (hypertension), malignant 08/14/2014  . Acute renal failure superimposed on stage 3 chronic kidney disease ([place]) 06/26/2014  . Hyperparathyroidism, unspecified ([place]) 02/24/2014  . Allergic rhinitis 02/22/2014  . Onychomycosis 02/22/2014  . Anxiety 11/05/2013  . Bradycardia 05/14/2013  . Gastro-esophageal reflux disease without esophagitis 04/05/2013  . Anemia in other chronic diseases classified elsewhere 01/27/2013    Orientation RESPIRATION BLADDER Height & Weight     Self, Time, Situation, Place  Normal Continent Weight: (!) 136.2 kg Height:  5\' 8"  (172.7 cm)  BEHAVIORAL SYMPTOMS/MOOD NEUROLOGICAL BOWEL NUTRITION STATUS      Continent Diet(Carb modified)  AMBULATORY STATUS COMMUNICATION OF NEEDS Skin   Extensive Assist Verbally Surgical wounds                       Personal Care Assistance Level of Assistance  Bathing, Feeding, Dressing Bathing Assistance: Limited assistance Feeding assistance: Independent Dressing Assistance: Limited assistance     Functional Limitations Info  Sight Sight Info: Impaired        SPECIAL CARE FACTORS FREQUENCY  PT (By licensed PT), OT (By licensed OT)                    Contractures Contractures Info: Not present    Additional Factors Info  Code Status, Allergies Code Status Info: Full Allergies Info: Penicillin, codeine, ibuprofen, percocet, tramadol, vicodin           Current Medications (05/06/2019):  This is the current hospital active medication list Current Facility-Administered Medications  Medication Dose Route Frequency Provider Last  Rate Last Dose  . 0.9 %  sodium chloride infusion   Intravenous Continuous Stegmayer, Kimberly A, PA-C 75 mL/hr at 05/06/19 1125    . acetaminophen (TYLENOL) tablet 650 mg  650 mg Oral Q6H PRN Schnier, Dolores Lory, MD   650 mg at 05/05/19 1321   Or  . acetaminophen (TYLENOL)  suppository 650 mg  650 mg Rectal Q6H PRN Schnier, Dolores Lory, MD      . albuterol (PROVENTIL) (2.5 MG/3ML) 0.083% nebulizer solution 2.5 mg  2.5 mg Nebulization Q6H PRN Nazari, Walid A, RPH      . amLODipine (NORVASC) tablet 10 mg  10 mg Oral Daily Schnier, Dolores Lory, MD   10 mg at 05/06/19 0835  . aspirin EC tablet 81 mg  81 mg Oral Daily Schnier, Dolores Lory, MD   81 mg at 05/06/19 0834  . cloNIDine (CATAPRES) tablet 0.3 mg  0.3 mg Oral BID Schnier, Dolores Lory, MD   0.3 mg at 05/06/19 0834  . docusate sodium (COLACE) capsule 100 mg  100 mg Oral BID Delana Meyer Dolores Lory, MD   100 mg at 05/06/19 0834  . [START ON 05/09/2019] Dulaglutide SOPN 1.5 mg  1.5 mg Subcutaneous Q Sun Stegmayer, Janalyn Harder, [place]      . DULoxetine (CYMBALTA) DR capsule 30 mg  30 mg Oral Daily Schnier, Dolores Lory, MD   30 mg at 05/06/19 0835  . enoxaparin (LOVENOX) injection 40 mg  40 mg Subcutaneous Q24H Schnier, Dolores Lory, MD      . fluticasone [facility]) 50 MCG/ACT nasal spray 1 spray  1 spray Each Nare Daily Schnier, Dolores Lory, MD      . gabapentin (NEURONTIN) tablet 600 mg  600 mg Oral QHS Schnier, Dolores Lory, MD   600 mg at 05/05/19 2155  . HYDROcodone-acetaminophen (NORCO/VICODIN) 5-325 MG per tablet 1-2 tablet  1-2 tablet Oral Q4H PRN Schnier, Dolores Lory, MD   2 tablet at 05/06/19 1440  . HYDROmorphone (DILAUDID) injection 1 mg  1 mg Intravenous Q2H PRN Schnier, Dolores Lory, MD      . insulin aspart (novoLOG) injection 0-5 Units  0-5 Units Subcutaneous QHS Stegmayer, Kimberly A, PA-C      . insulin aspart (novoLOG) injection 0-9 Units  0-9 Units Subcutaneous TID WC Stegmayer, Kimberly A, PA-C      . insulin aspart (novoLOG) injection 3 Units  3 Units Subcutaneous TID WC Stegmayer, Kimberly A, PA-C      . insulin detemir (LEVEMIR) injection 9 Units  9 Units Subcutaneous QHS Stegmayer, Kimberly A, PA-C      . isosorbide mononitrate (IMDUR) 24 hr tablet 30 mg  30 mg Oral BID Schnier, Dolores Lory, MD   30 mg at 05/06/19 0835  .  isosorbide mononitrate (IMDUR) 24 hr tablet 60 mg  60 mg Oral BID Delana Meyer Dolores Lory, MD   60 mg at 05/06/19 2585  . multivitamin with minerals tablet 1 tablet  1 tablet Oral Daily Schnier, Dolores Lory, MD   1 tablet at 05/06/19 [phone]  . ondansetron (ZOFRAN) tablet 4 mg  4 mg Oral Q6H PRN Schnier, Dolores Lory, MD       Or  . ondansetron [facility]) injection 4 mg  4 mg Intravenous Q6H PRN Schnier, Dolores Lory, MD      . pantoprazole (PROTONIX) EC tablet 40 mg  40 mg Oral Daily Schnier, Dolores Lory, MD   40 mg at 05/06/19 0835  . polyethylene glycol (MIRALAX / GLYCOLAX) packet 17 g  17 g  Oral Daily PRN Schnier, Dolores Lory, MD      . protein supplement (ENSURE MAX) liquid  11 oz Oral BID Schnier, Dolores Lory, MD   11 oz at 05/06/19 1126  . sodium bicarbonate tablet 650 mg  650 mg Oral BID Delana Meyer Dolores Lory, MD   650 mg at 05/06/19 0834  . torsemide (DEMADEX) tablet 20 mg  20 mg Oral BID Schnier, Dolores Lory, MD   20 mg at 05/06/19 [phone]  . vitamin B-12 (CYANOCOBALAMIN) tablet 1,000 mcg  1,000 mcg Oral Daily Schnier, Dolores Lory, MD   1,000 mcg at 05/06/19 [phone]  . vitamin C (ASCORBIC ACID) tablet 250 mg  250 mg Oral BID Schnier, Dolores Lory, MD         Discharge Medications: Please see discharge summary for a list of discharge medications.  Relevant Imaging Results:  Relevant Lab Results:   Additional Information ss [id number]  Beverly Sessions, RN

## 2019-05-06 NOTE — Progress Notes (Signed)
Inpatient Rehabilitation Admissions Coordinator  Noted pt requests [place] SNF rather than inpt rehab at [facility]. I contacted RN CM, Colletta [place], to discuss. If would like to be reconsidered, please contact me.  Danne Baxter, RN, MSN Rehab Admissions Coordinator [phone] 05/06/2019 3:51 PM

## 2019-05-06 NOTE — Progress Notes (Signed)
Lovenox held per Dr. Ronalee Belts d/t hemoglobin trending down. Lupita Leash

## 2019-05-06 NOTE — Progress Notes (Signed)
OT Cancellation Note  Patient Details Name: Rachel Holmes MRN: [id number] DOB: [birth year]   Cancelled Treatment:    Reason Eval/Treat Not Completed: Fatigue/lethargy limiting ability to participate. Order received, chart reviewed. Pt sleeping soundly upon entry, does not wake to cues. Will re-attempt OT evaluation at later date/time as pt is available and medically appropriate.   Jeni Salles, MPH, MS, OTR/L ascom [phone] 05/06/19, 10:44 AM

## 2019-05-06 NOTE — Care Management Important Message (Signed)
Important Message  Patient Details  Name: Rachel Holmes MRN: [id number] Date of Birth: 1961-08-21   Medicare Important Message Given:  Yes  Initial Medicare IM given by Patient Access Associate on 05/05/2019 at 1:34pm. Still valid.   Dannette Barbara 05/06/2019, 2:52 PM

## 2019-05-06 NOTE — Progress Notes (Signed)
[facility] Daily Progress Note   Subjective: 1 Day Post-Op: Left below-the-knee amputation  Drowsy but easily arousable.  Night of surgery unremarkable.  Patient without complaint this morning.   Objective: Vitals:   05/05/19 2145 05/06/19 0500 05/06/19 0507 05/06/19 0832  BP: (!) 136/56  (!) 151/63 (!) 133/53  Pulse: 73  79 71  Resp: 20  16 18   Temp: 98.1 F (36.7 C)  98.4 F (36.9 C) 98.5 F (36.9 C)  TempSrc: Oral  Oral Oral  SpO2: 97%  94% 95%  Weight:  (!) 136.2 kg    Height:        Intake/Output Summary (Last 24 hours) at 05/06/2019 1042 Last data filed at 05/06/2019 0519 Gross per 24 hour  Intake 590 ml  Output 1000 ml  Net -410 ml   Physical Exam: A&Ox3, NAD CV: RRR Pulmonary: CTA Bilaterally Abdomen: Soft, Nontender, Nondistended Vascular:  Left Lower Extremity: Thigh soft. OR dressing - clean, dry, and intact.   Laboratory: CBC    Component Value Date/Time   WBC 11.0 (H) 05/06/2019 0330   HGB 8.1 (L) 05/06/2019 0330   HGB 9.7 (L) 06/25/2018 1050   HCT 24.7 (L) 05/06/2019 0330   HCT 30.3 (L) 06/25/2018 1050   PLT 174 05/06/2019 0330   PLT 210 09/09/2014 1220   BMET    Component Value Date/Time   NA 136 05/06/2019 0330   NA 139 06/25/2018 1050   NA 137 11/21/2013 0627   K 5.0 05/06/2019 0330   K 4.6 11/21/2013 0627   CL 103 05/06/2019 0330   CL 107 11/21/2013 0627   CO2 23 05/06/2019 0330   CO2 24 11/21/2013 0627   GLUCOSE 238 (H) 05/06/2019 0330   GLUCOSE 95 11/21/2013 0627   BUN 61 (H) 05/06/2019 0330   BUN 43 (H) 06/25/2018 1050   BUN 34 (H) 11/21/2013 0627   CREATININE 4.22 (H) 05/06/2019 0330   CREATININE 1.80 (H) 09/09/2014 1220   CALCIUM 7.8 (L) 05/06/2019 0330   CALCIUM 8.8 09/09/2014 1220   GFRNONAA 11 (L) 05/06/2019 0330   GFRNONAA 31 (L) 09/09/2014 1220   GFRNONAA 26 (L) 11/21/2013 0627   GFRAA 13 (L) 05/06/2019 0330   GFRAA 38 (L) 09/09/2014 1220   GFRAA 30 (L) 11/21/2013 0354    Assessment/Planning: The patient is a 58 year old female status post a left below the knee amputation postop day #1 1) plan to remove OR dressing postop day 4-5 2) PT / OT consult ordered 3) Diabetes / Nutrition consult ordered 4) Social work consult ordered as the patient will need to be discharged to SNF most likely however pending physical therapy recommendation 5) 2 g drop in hemoglobin this a.m. however the patient seems to be asymptomatic.  CBC in a.m. 6) Patient with chronic kidney disease. Slight increase in creatinine running normal saline at 75 cc x 12 hours  Discussed with Dr. Eber Hong Stegmayer PA-C 05/06/2019 10:42 AM

## 2019-05-06 NOTE — TOC Progression Note (Signed)
Transition of Care [facility]) - Progression Note    Patient Details  Name: Rachel Holmes MRN: [id number] Date of Birth: 12/20/1960  Transition of Care [facility]) CM/SW Contact  Beverly Sessions, RN Phone Number: 05/06/2019, 5:00 PM  Clinical Narrative:    [facility] accepted in [place].  Magda Paganini Notified.  She is to start auth 7/3   Expected Discharge Plan: [place] Barriers to Discharge: Continued Medical Work up  Expected Discharge Plan and Services Expected Discharge Plan: [place]   Discharge Planning Services: CM Consult   Living arrangements for the past 2 months: [place]                                       Social Determinants of Health (SDOH) Interventions    Readmission Risk Interventions Readmission Risk Prevention Plan 05/06/2019  Transportation Screening Complete  Medication Review Press photographer) Complete  Some recent data might be hidden

## 2019-05-06 NOTE — Progress Notes (Signed)
Rehab Admissions Coordinator Note:  Per PT recommendation, patient was screened by Michel Santee for appropriateness for an Inpatient Acute Rehab Consult.  At this time, we are recommending Inpatient Rehab consult.  Please place IP Rehab MD consult order.   Michel Santee 05/06/2019, 2:34 PM  I can be reached at [phone].

## 2019-05-07 LAB — TYPE AND SCREEN
ABO/RH(D): A POS
Antibody Screen: NEGATIVE
Unit division: 0
Unit division: 0

## 2019-05-07 LAB — BPAM RBC
Blood Product Expiration Date: 202007182359
Blood Product Expiration Date: 202007262359
ISSUE DATE / TIME: 202007010940
ISSUE DATE / TIME: 202007010940
Unit Type and Rh: 6200
Unit Type and Rh: 6200

## 2019-05-07 LAB — BASIC METABOLIC PANEL
Anion gap: 10 (ref 5–15)
BUN: 68 mg/dL — ABNORMAL HIGH (ref 6–20)
CO2: 23 mmol/L (ref 22–32)
Calcium: 8.1 mg/dL — ABNORMAL LOW (ref 8.9–10.3)
Chloride: 102 mmol/L (ref 98–111)
Creatinine, Ser: 4.32 mg/dL — ABNORMAL HIGH (ref 0.44–1.00)
GFR calc Af Amer: 12 mL/min — ABNORMAL LOW (ref >60)
GFR calc non Af Amer: 11 mL/min — ABNORMAL LOW (ref >60)
Glucose, Bld: 244 mg/dL — ABNORMAL HIGH (ref 70–99)
Potassium: 4.8 mmol/L (ref 3.5–5.1)
Sodium: 135 mmol/L (ref 135–145)

## 2019-05-07 LAB — GLUCOSE, CAPILLARY
Glucose-Capillary: 270 mg/dL — ABNORMAL HIGH (ref 70–99)
Glucose-Capillary: 228 mg/dL — ABNORMAL HIGH (ref 70–99)
Glucose-Capillary: 207 mg/dL — ABNORMAL HIGH (ref 70–99)
Glucose-Capillary: 248 mg/dL — ABNORMAL HIGH (ref 70–99)
Glucose-Capillary: 231 mg/dL — ABNORMAL HIGH (ref 70–99)

## 2019-05-07 LAB — CBC
HCT: 25.5 % — ABNORMAL LOW (ref 36.0–46.0)
Hemoglobin: 8.1 g/dL — ABNORMAL LOW (ref 12.0–15.0)
MCH: 29.3 pg (ref 26.0–34.0)
MCHC: 31.8 g/dL (ref 30.0–36.0)
MCV: 92.4 fL (ref 80.0–100.0)
Platelets: 161 10*3/uL (ref 150–400)
RBC: 2.76 MIL/uL — ABNORMAL LOW (ref 3.87–5.11)
RDW: 13.2 % (ref 11.5–15.5)
WBC: 10.3 10*3/uL (ref 4.0–10.5)
nRBC: 0 % (ref 0.0–0.2)

## 2019-05-07 LAB — MAGNESIUM: Magnesium: 2.1 mg/dL (ref 1.7–2.4)

## 2019-05-07 MED ORDER — DIPHENHYDRAMINE HCL 25 MG PO CAPS
25.0000 mg | ORAL_CAPSULE | Freq: Four times a day (QID) | ORAL | Status: DC | PRN
  Administered 2019-05-07 – 2019-05-11 (×9): 25 mg via ORAL
  Filled 2019-05-07 (×9): qty 1

## 2019-05-07 MED ORDER — HYDROCORTISONE 1 % EX CREA
TOPICAL_CREAM | Freq: Three times a day (TID) | CUTANEOUS | Status: DC
  Administered 2019-05-07 – 2019-05-09 (×6): via TOPICAL
  Administered 2019-05-09: 1 via TOPICAL
  Administered 2019-05-10 – 2019-05-12 (×4): via TOPICAL
  Filled 2019-05-07: qty 28

## 2019-05-07 MED ORDER — ENOXAPARIN SODIUM 30 MG/0.3ML ~~LOC~~ SOLN
30.0000 mg | SUBCUTANEOUS | Status: DC
  Administered 2019-05-08 – 2019-05-12 (×5): 30 mg via SUBCUTANEOUS
  Filled 2019-05-07 (×5): qty 0.3

## 2019-05-07 NOTE — Progress Notes (Signed)
Inpatient Diabetes Program Recommendations  AACE/ADA: New Consensus Statement on Inpatient Glycemic Control (2015)  Target Ranges:  Prepandial:   less than 140 mg/dL      Peak postprandial:   less than 180 mg/dL (1-2 hours)      Critically ill patients:  140 - 180 mg/dL   Lab Results  Component Value Date   GLUCAP 270 (H) 05/07/2019   HGBA1C 7.3 (H) 05/06/2019    Review of Glycemic Control  Diabetes history: DM2 Outpatient Diabetes medications: Trulicity 1.5 mg weekly + Novolog 08-18-11 units tid meal coverage Current orders for Inpatient glycemic control: Levemir 9 units + Novolog 3 units tid meal coverage +Novolog sensitive correction tid + hs 0-5 units + Truilicity weekly  Inpatient Diabetes Program Recommendations:   -Increase Levemir to 14 units daily -Increase Novolog to 5 units tid meal coverage if eats 50%  Thank you, Bethena Roys E. Delita Chiquito, RN, MSN, CDE  Diabetes Coordinator Inpatient Glycemic Control Team Team Pager [phone] (8am-5pm) 05/07/2019 10:44 AM

## 2019-05-07 NOTE — Care Management Important Message (Signed)
Important Message  Patient Details  Name: Rachel Holmes MRN: [id number] Date of Birth: 03/15/1961   Medicare Important Message Given:  Yes     [name] 05/07/2019, 12:13 PM

## 2019-05-07 NOTE — NC FL2 (Signed)
[place] LEVEL OF CARE SCREENING TOOL     IDENTIFICATION  Patient Name: Rachel Holmes Birthdate: 1961-08-25 Sex: female Admission Date (Current Location): 05/05/2019  [place] and [place] Number:  Engineering geologist and Address:  [facility], [address] [place], [place] [address]      Provider Number: [id number]  Attending Physician Name and Address:  Katha Cabal, MD  Relative Name and Phone Number:       Current Level of Care: Hospital Recommended Level of Care: [place] Prior Approval Number:    Date Approved/Denied:   PASRR Number: [id number]  Discharge Plan: SNF    Current Diagnoses: Patient Active Problem List   Diagnosis Date Noted  . Chronic osteomyelitis of ankle and foot, left ([place]) 05/05/2019  . Chronic diastolic CHF (congestive heart failure) ([place]) 04/19/2019  . Leg edema 04/19/2019  . Acute bacterial endocarditis 04/19/2019  . Chest pain of uncertain etiology 60/08/9322  . Ulcer of left ankle ([place]) 04/14/2019  . Aortic valve endocarditis   . Bacteremia due to Streptococcus 12/24/2018  . Malignant hypertension (arteriolar nephrosclerosis), stage 1-4 or unspecified chronic kidney disease 07/15/2018  . Stage 4 chronic kidney disease ([place]) 07/15/2018  . Morbid obesity with BMI of 45.0-49.9, adult ([place]) 07/15/2018  . Excessive daytime sleepiness 07/15/2018  . Other fatigue 03/11/2018  . Essential hypertension 03/11/2018  . Vitamin D deficiency 03/11/2018  . Congestive heart failure ([place]) 03/11/2018  . Degenerative arthritis of right knee 01/06/2018  . Uncontrolled type 2 diabetes mellitus with polyneuropathy ([place]) 10/03/2017  . Knee pain 10/02/2017  . Obesity 10/02/2017  . Charcot's joint of left foot 08/29/2017  . Lymphedema 09/06/2016  . Hyponatremia with extracellular fluid depletion 02/02/2016  . Nausea with vomiting, unspecified 02/02/2016  . Closed nondisplaced fracture of right  patella 08/23/2015  . Acute cystitis without hematuria 08/14/2014  . Colitis 08/14/2014  . HTN (hypertension), malignant 08/14/2014  . Acute renal failure superimposed on stage 3 chronic kidney disease ([place]) 06/26/2014  . Hyperparathyroidism, unspecified ([place]) 02/24/2014  . Allergic rhinitis 02/22/2014  . Onychomycosis 02/22/2014  . Anxiety 11/05/2013  . Bradycardia 05/14/2013  . Gastro-esophageal reflux disease without esophagitis 04/05/2013  . Anemia in other chronic diseases classified elsewhere 01/27/2013    Orientation RESPIRATION BLADDER Height & Weight     Self, Time, Situation, Place  Normal Continent Weight: (!) 137.2 kg Height:  5\' 8"  (172.7 cm)  BEHAVIORAL SYMPTOMS/MOOD NEUROLOGICAL BOWEL NUTRITION STATUS      Continent Diet(Carb modified)  AMBULATORY STATUS COMMUNICATION OF NEEDS Skin   Extensive Assist Verbally Surgical wounds                       Personal Care Assistance Level of Assistance  Bathing, Feeding, Dressing Bathing Assistance: Limited assistance Feeding assistance: Independent Dressing Assistance: Limited assistance     Functional Limitations Info  Sight Sight Info: Impaired        SPECIAL CARE FACTORS FREQUENCY  PT (By licensed PT), OT (By licensed OT)                    Contractures Contractures Info: Not present    Additional Factors Info  Code Status, Allergies Code Status Info: Full Allergies Info: Penicillin, codeine, ibuprofen, percocet, tramadol, vicodin           Current Medications (05/07/2019):  This is the current hospital active medication list Current Facility-Administered Medications  Medication Dose Route Frequency Provider Last  Rate Last Dose  . acetaminophen (TYLENOL) tablet 650 mg  650 mg Oral Q6H PRN Schnier, Dolores Lory, MD   650 mg at 05/05/19 1321   Or  . acetaminophen (TYLENOL) suppository 650 mg  650 mg Rectal Q6H PRN Schnier, Dolores Lory, MD      . albuterol (PROVENTIL) (2.5 MG/3ML) 0.083% nebulizer  solution 2.5 mg  2.5 mg Nebulization Q6H PRN Nazari, Walid A, RPH      . amLODipine (NORVASC) tablet 10 mg  10 mg Oral Daily Schnier, Dolores Lory, MD   10 mg at 05/07/19 0845  . aspirin EC tablet 81 mg  81 mg Oral Daily Schnier, Dolores Lory, MD   81 mg at 05/07/19 0845  . cloNIDine (CATAPRES) tablet 0.3 mg  0.3 mg Oral BID Schnier, Dolores Lory, MD   0.3 mg at 05/07/19 0845  . docusate sodium (COLACE) capsule 100 mg  100 mg Oral BID Delana Meyer Dolores Lory, MD   100 mg at 05/07/19 0846  . [START ON 05/09/2019] Dulaglutide SOPN 1.5 mg  1.5 mg Subcutaneous Q Sun Stegmayer, Janalyn Harder, [place]      . DULoxetine (CYMBALTA) DR capsule 30 mg  30 mg Oral Daily Schnier, Dolores Lory, MD   30 mg at 05/07/19 0845  . [START ON 05/08/2019] enoxaparin (LOVENOX) injection 30 mg  30 mg Subcutaneous Q24H Dallie Piles, RPH      . fluticasone (FLONASE) 50 MCG/ACT nasal spray 1 spray  1 spray Each Nare Daily Schnier, Dolores Lory, MD      . gabapentin (NEURONTIN) tablet 600 mg  600 mg Oral QHS Schnier, Dolores Lory, MD   600 mg at 05/06/19 2248  . HYDROcodone-acetaminophen (NORCO/VICODIN) 5-325 MG per tablet 1-2 tablet  1-2 tablet Oral Q4H PRN Schnier, Dolores Lory, MD   2 tablet at 05/07/19 1357  . hydrocortisone cream 1 %   Topical TID Schnier, Dolores Lory, MD      . HYDROmorphone (DILAUDID) injection 1 mg  1 mg Intravenous Q2H PRN Schnier, Dolores Lory, MD   1 mg at 05/06/19 2249  . insulin aspart (novoLOG) injection 0-5 Units  0-5 Units Subcutaneous QHS Stegmayer, Kimberly A, PA-C      . insulin aspart (novoLOG) injection 0-9 Units  0-9 Units Subcutaneous TID WC Stegmayer, Kimberly A, PA-C   3 Units at 05/07/19 1352  . insulin aspart (novoLOG) injection 3 Units  3 Units Subcutaneous TID WC Stegmayer, Kimberly A, PA-C   3 Units at 05/07/19 1353  . insulin detemir (LEVEMIR) injection 9 Units  9 Units Subcutaneous QHS Stegmayer, Kimberly A, PA-C   9 Units at 05/06/19 2249  . isosorbide mononitrate (IMDUR) 24 hr tablet 30 mg  30 mg Oral BID Schnier,  Dolores Lory, MD   30 mg at 05/07/19 0846  . isosorbide mononitrate (IMDUR) 24 hr tablet 60 mg  60 mg Oral BID Delana Meyer Dolores Lory, MD   60 mg at 05/07/19 0844  . multivitamin with minerals tablet 1 tablet  1 tablet Oral Daily Schnier, Dolores Lory, MD   1 tablet at 05/07/19 0845  . ondansetron (ZOFRAN) tablet 4 mg  4 mg Oral Q6H PRN Schnier, Dolores Lory, MD       Or  . ondansetron [facility]) injection 4 mg  4 mg Intravenous Q6H PRN Schnier, Dolores Lory, MD      . pantoprazole (PROTONIX) EC tablet 40 mg  40 mg Oral Daily Schnier, Dolores Lory, MD   40 mg at 05/07/19 0846  . polyethylene  glycol (MIRALAX / GLYCOLAX) packet 17 g  17 g Oral Daily PRN Schnier, Dolores Lory, MD      . protein supplement (ENSURE MAX) liquid  11 oz Oral BID Schnier, Dolores Lory, MD   11 oz at 05/07/19 0846  . sodium bicarbonate tablet 650 mg  650 mg Oral BID Delana Meyer Dolores Lory, MD   650 mg at 05/07/19 0845  . torsemide (DEMADEX) tablet 20 mg  20 mg Oral BID Schnier, Dolores Lory, MD   20 mg at 05/07/19 0844  . vitamin B-12 (CYANOCOBALAMIN) tablet 1,000 mcg  1,000 mcg Oral Daily Schnier, Dolores Lory, MD   1,000 mcg at 05/07/19 0846  . vitamin C (ASCORBIC ACID) tablet 250 mg  250 mg Oral BID Schnier, Dolores Lory, MD   250 mg at 05/07/19 9741     Discharge Medications: Please see discharge summary for a list of discharge medications.  Relevant Imaging Results:  Relevant Lab Results:   Additional Information ss [id number]  Katrina Stack, RN

## 2019-05-07 NOTE — TOC Progression Note (Signed)
Transition of Care [facility]) - Progression Note    Patient Details  Name: VIRTIE BUNGERT MRN: [id number] Date of Birth: [birth year]  Transition of Care [facility]) CM/SW Contact  Katrina Stack, RN Phone Number: 05/07/2019, 3:31 PM  Clinical Narrative:   Received pasrr. Left voicemail for Magda Paganini with [facility] to follow up on auth    Expected Discharge Plan: [place] Barriers to Discharge: Continued Medical Work up  Expected Discharge Plan and Services Expected Discharge Plan: [place]   Discharge Planning Services: CM Consult   Living arrangements for the past 2 months: [place]                                       Social Determinants of Health (SDOH) Interventions    Readmission Risk Interventions Readmission Risk Prevention Plan 05/06/2019  Transportation Screening Complete  Medication Review Press photographer) Complete  Some recent data might be hidden

## 2019-05-07 NOTE — Progress Notes (Signed)
Physical Therapy Treatment Patient Details Name: Rachel Holmes MRN: [id number] DOB: 03/26/1961 Today's Date: 05/07/2019    History of Present Illness Pt admitted for chronic osteomyelitis of L foot resulting in L BKA on 05/05/19.    PT Comments    To edge of bed with rail and good effort.  No physical assist needed.  Once sitting, steady.  She is able to lateral scoot to drop down recliner at bedside with min guard.  Pt initially stated she could stand but then stated "I'm going to need a knee brace to stand up."  Pt asked if she had one and stated she did not.  Stated her RLE has become weak over the past few months.  Participated in exercises as described below.   Follow Up Recommendations  CIR;SNF  - CIR remains appropriate but pt is refusing and requesting [facility].  Educated on differences of programs but she remains firm in her request.     Equipment Recommendations       Recommendations for Other Services       Precautions / Restrictions Precautions Precautions: Fall Restrictions Weight Bearing Restrictions: Yes LLE Weight Bearing: Non weight bearing    Mobility  Bed Mobility Overal bed mobility: Needs Assistance Bed Mobility: Supine to Sit     Supine to sit: Min assist;Mod assist     General bed mobility comments: Pt puts good effort into mobility.  Uses rails without assist.  Transfers Overall transfer level: Needs assistance Equipment used: None Transfers: Lateral/Scoot Transfers           General transfer comment: Lateral scoot with drop down recliner.  No assist needed  Ambulation/Gait             General Gait Details: deferred   Stairs             Wheelchair Mobility    Modified Rankin (Stroke Patients Only)       Balance Overall balance assessment: Needs assistance Sitting-balance support: Feet supported;Bilateral upper extremity supported Sitting balance-Leahy Scale: Good                                       Cognition Arousal/Alertness: Awake/alert Behavior During Therapy: WFL for tasks assessed/performed Overall Cognitive Status: Within Functional Limits for tasks assessed                                        Exercises Other Exercises Other Exercises: Supine ther-ex performed on B LE including SLRs, quad sets, leg presses and hip abd/add x 10 reps. Min self  assist required.    General Comments        Pertinent Vitals/Pain Pain Assessment: Faces Faces Pain Scale: Hurts little more Pain Location: L residual limb Pain Descriptors / Indicators: Operative site guarding Pain Intervention(s): Limited activity within patient's tolerance;Monitored during session    Home Living                      Prior Function            PT Goals (current goals can now be found in the care plan section) Progress towards PT goals: Progressing toward goals    Frequency    7X/week      PT Plan Current plan remains appropriate    Co-evaluation  AM-PAC PT "6 Clicks" Mobility   Outcome Measure  Help needed turning from your back to your side while in a flat bed without using bedrails?: A Little Help needed moving from lying on your back to sitting on the side of a flat bed without using bedrails?: A Little Help needed moving to and from a bed to a chair (including a wheelchair)?: A Little Help needed standing up from a chair using your arms (e.g., wheelchair or bedside chair)?: Total Help needed to walk in hospital room?: Total Help needed climbing 3-5 steps with a railing? : Total 6 Click Score: 12    End of Session Equipment Utilized During Treatment: Gait belt Activity Tolerance: Patient tolerated treatment well Patient left: in chair;with call bell/phone within reach;with chair alarm set Nurse Communication: Mobility status Pain - Right/Left: Left Pain - part of body: Leg     Time: 1143-1200 PT Time Calculation (min) (ACUTE  ONLY): 17 min  Charges:  $Therapeutic Exercise: 8-22 mins                     Chesley Noon, PTA 05/07/19, 12:57 PM

## 2019-05-07 NOTE — Progress Notes (Signed)
    Subjective  - POD #2  C/o itching Pain comntrolled   Physical Exam:  Dressing dry Knee is straight       Assessment/Plan:  POD #2  PT/OT OOB and mobilize D/c foley  [facility] Jamarkis Branam 05/07/2019 6:54 PM --  Vitals:   05/07/19 0642 05/07/19 1156  BP: (!) 166/65 (!) 133/50  Pulse: 78 75  Resp:  19  Temp:  98.4 F (36.9 C)  SpO2:  98%    Intake/Output Summary (Last 24 hours) at 05/07/2019 1854 Last data filed at 05/07/2019 1700 Gross per 24 hour  Intake 480 ml  Output 2325 ml  Net -1845 ml     Laboratory CBC    Component Value Date/Time   WBC 10.3 05/07/2019 0506   HGB 8.1 (L) 05/07/2019 0506   HGB 9.7 (L) 06/25/2018 1050   HCT 25.5 (L) 05/07/2019 0506   HCT 30.3 (L) 06/25/2018 1050   PLT 161 05/07/2019 0506   PLT 210 09/09/2014 1220    BMET    Component Value Date/Time   NA 135 05/07/2019 0506   NA 139 06/25/2018 1050   NA 137 11/21/2013 0627   K 4.8 05/07/2019 0506   K 4.6 11/21/2013 0627   CL 102 05/07/2019 0506   CL 107 11/21/2013 0627   CO2 23 05/07/2019 0506   CO2 24 11/21/2013 0627   GLUCOSE 244 (H) 05/07/2019 0506   GLUCOSE 95 11/21/2013 0627   BUN 68 (H) 05/07/2019 0506   BUN 43 (H) 06/25/2018 1050   BUN 34 (H) 11/21/2013 0627   CREATININE 4.32 (H) 05/07/2019 0506   CREATININE 1.80 (H) 09/09/2014 1220   CALCIUM 8.1 (L) 05/07/2019 0506   CALCIUM 8.8 09/09/2014 1220   GFRNONAA 11 (L) 05/07/2019 0506   GFRNONAA 31 (L) 09/09/2014 1220   GFRNONAA 26 (L) 11/21/2013 0627   GFRAA 12 (L) 05/07/2019 0506   GFRAA 38 (L) 09/09/2014 1220   GFRAA 30 (L) 11/21/2013 0627    COAG Lab Results  Component Value Date   INR 1.1 04/30/2019   No results found for: PTT  Antibiotics Anti-infectives (From admission, onward)   Start     Dose/Rate Route Frequency Ordered Stop   05/05/19 1400  clindamycin (CLEOCIN) IVPB 300 mg     300 mg 100 mL/hr over 30 Minutes Intravenous Every 8 hours 05/05/19 1103 05/06/19 0549   05/05/19 0739  clindamycin  (CLEOCIN) 300 MG/50ML IVPB    Note to Pharmacy: Hallaji, Violet   : cabinet override      05/05/19 0739 05/05/19 0828   05/05/19 0600  clindamycin (CLEOCIN) IVPB 300 mg     300 mg 100 mL/hr over 30 Minutes Intravenous On call to O.R. 05/04/19 2258 05/05/19 0086       V. Leia Alf, M.D., [facility] [place] Office: [phone] Pager:  [phone]

## 2019-05-07 NOTE — NC FL2 (Signed)
[place] LEVEL OF CARE SCREENING TOOL     IDENTIFICATION  Patient Name: Rachel Holmes Birthdate: [birth year] Sex: female Admission Date (Current Location): 05/05/2019  [place] and [place] Number:  Engineering geologist and Address:  [facility], [address] [place], [place] [address]      Provider Number: [id number]  Attending Physician Name and Address:  Katha Cabal, MD  Relative Name and Phone Number:       Current Level of Care: Hospital Recommended Level of Care: [place] Prior Approval Number:    Date Approved/Denied:   PASRR Number: [id number]  Discharge Plan: SNF    Current Diagnoses: Patient Active Problem List   Diagnosis Date Noted  . Chronic osteomyelitis of ankle and foot, left ([place]) 05/05/2019  . Chronic diastolic CHF (congestive heart failure) ([place]) 04/19/2019  . Leg edema 04/19/2019  . Acute bacterial endocarditis 04/19/2019  . Chest pain of uncertain etiology 53/97/6734  . Ulcer of left ankle ([place]) 04/14/2019  . Aortic valve endocarditis   . Bacteremia due to Streptococcus 12/24/2018  . Malignant hypertension (arteriolar nephrosclerosis), stage 1-4 or unspecified chronic kidney disease 07/15/2018  . Stage 4 chronic kidney disease ([place]) 07/15/2018  . Morbid obesity with BMI of 45.0-49.9, adult ([place]) 07/15/2018  . Excessive daytime sleepiness 07/15/2018  . Other fatigue 03/11/2018  . Essential hypertension 03/11/2018  . Vitamin D deficiency 03/11/2018  . Congestive heart failure ([place]) 03/11/2018  . Degenerative arthritis of right knee 01/06/2018  . Uncontrolled type 2 diabetes mellitus with polyneuropathy ([place]) 10/03/2017  . Knee pain 10/02/2017  . Obesity 10/02/2017  . Charcot's joint of left foot 08/29/2017  . Lymphedema 09/06/2016  . Hyponatremia with extracellular fluid depletion 02/02/2016  . Nausea with vomiting, unspecified 02/02/2016  . Closed nondisplaced fracture  of right patella 08/23/2015  . Acute cystitis without hematuria 08/14/2014  . Colitis 08/14/2014  . HTN (hypertension), malignant 08/14/2014  . Acute renal failure superimposed on stage 3 chronic kidney disease ([place]) 06/26/2014  . Hyperparathyroidism, unspecified ([place]) 02/24/2014  . Allergic rhinitis 02/22/2014  . Onychomycosis 02/22/2014  . Anxiety 11/05/2013  . Bradycardia 05/14/2013  . Gastro-esophageal reflux disease without esophagitis 04/05/2013  . Anemia in other chronic diseases classified elsewhere 01/27/2013    Orientation RESPIRATION BLADDER Height & Weight     Self, Time, Situation, Place  Normal Continent Weight: (!) 137.2 kg Height:  5\' 8"  (172.7 cm)  BEHAVIORAL SYMPTOMS/MOOD NEUROLOGICAL BOWEL NUTRITION STATUS      Continent Diet(Carb modified)  AMBULATORY STATUS COMMUNICATION OF NEEDS Skin   Extensive Assist Verbally Surgical wounds                       Personal Care Assistance Level of Assistance  Bathing, Feeding, Dressing Bathing Assistance: Limited assistance Feeding assistance: Independent Dressing Assistance: Limited assistance     Functional Limitations Info  Sight Sight Info: Impaired        SPECIAL CARE FACTORS FREQUENCY  PT (By licensed PT), OT (By licensed OT)                    Contractures Contractures Info: Not present    Additional Factors Info  Code Status, Allergies Code Status Info: Full Allergies Info: Penicillin, codeine, ibuprofen, percocet, tramadol, vicodin           Current Medications (05/07/2019):  This is the current hospital active medication list Current Facility-Administered Medications  Medication Dose Route Frequency Provider Last  Rate Last Dose  . acetaminophen (TYLENOL) tablet 650 mg  650 mg Oral Q6H PRN Schnier, Dolores Lory, MD   650 mg at 05/05/19 1321   Or  . acetaminophen (TYLENOL) suppository 650 mg  650 mg Rectal Q6H PRN Schnier, Dolores Lory, MD      . albuterol (PROVENTIL) (2.5 MG/3ML) 0.083%  nebulizer solution 2.5 mg  2.5 mg Nebulization Q6H PRN Nazari, Walid A, RPH      . amLODipine (NORVASC) tablet 10 mg  10 mg Oral Daily Schnier, Dolores Lory, MD   10 mg at 05/07/19 0845  . aspirin EC tablet 81 mg  81 mg Oral Daily Schnier, Dolores Lory, MD   81 mg at 05/07/19 0845  . cloNIDine (CATAPRES) tablet 0.3 mg  0.3 mg Oral BID Schnier, Dolores Lory, MD   0.3 mg at 05/07/19 0845  . docusate sodium (COLACE) capsule 100 mg  100 mg Oral BID Delana Meyer Dolores Lory, MD   100 mg at 05/07/19 0846  . [START ON 05/09/2019] Dulaglutide SOPN 1.5 mg  1.5 mg Subcutaneous Q Sun Stegmayer, Janalyn Harder, [place]      . DULoxetine (CYMBALTA) DR capsule 30 mg  30 mg Oral Daily Schnier, Dolores Lory, MD   30 mg at 05/07/19 0845  . [START ON 05/08/2019] enoxaparin (LOVENOX) injection 30 mg  30 mg Subcutaneous Q24H Dallie Piles, RPH      . fluticasone (FLONASE) 50 MCG/ACT nasal spray 1 spray  1 spray Each Nare Daily Schnier, Dolores Lory, MD      . gabapentin (NEURONTIN) tablet 600 mg  600 mg Oral QHS Schnier, Dolores Lory, MD   600 mg at 05/06/19 2248  . HYDROcodone-acetaminophen (NORCO/VICODIN) 5-325 MG per tablet 1-2 tablet  1-2 tablet Oral Q4H PRN Schnier, Dolores Lory, MD   2 tablet at 05/07/19 1357  . hydrocortisone cream 1 %   Topical TID Schnier, Dolores Lory, MD      . HYDROmorphone (DILAUDID) injection 1 mg  1 mg Intravenous Q2H PRN Schnier, Dolores Lory, MD   1 mg at 05/06/19 2249  . insulin aspart (novoLOG) injection 0-5 Units  0-5 Units Subcutaneous QHS Stegmayer, Kimberly A, PA-C      . insulin aspart (novoLOG) injection 0-9 Units  0-9 Units Subcutaneous TID WC Stegmayer, Kimberly A, PA-C   3 Units at 05/07/19 1352  . insulin aspart (novoLOG) injection 3 Units  3 Units Subcutaneous TID WC Stegmayer, Kimberly A, PA-C   3 Units at 05/07/19 1353  . insulin detemir (LEVEMIR) injection 9 Units  9 Units Subcutaneous QHS Stegmayer, Kimberly A, PA-C   9 Units at 05/06/19 2249  . isosorbide mononitrate (IMDUR) 24 hr tablet 30 mg  30 mg Oral BID  Schnier, Dolores Lory, MD   30 mg at 05/07/19 0846  . isosorbide mononitrate (IMDUR) 24 hr tablet 60 mg  60 mg Oral BID Delana Meyer Dolores Lory, MD   60 mg at 05/07/19 0844  . multivitamin with minerals tablet 1 tablet  1 tablet Oral Daily Schnier, Dolores Lory, MD   1 tablet at 05/07/19 0845  . ondansetron (ZOFRAN) tablet 4 mg  4 mg Oral Q6H PRN Schnier, Dolores Lory, MD       Or  . ondansetron [facility]) injection 4 mg  4 mg Intravenous Q6H PRN Schnier, Dolores Lory, MD      . pantoprazole (PROTONIX) EC tablet 40 mg  40 mg Oral Daily Schnier, Dolores Lory, MD   40 mg at 05/07/19 0846  . polyethylene  glycol (MIRALAX / GLYCOLAX) packet 17 g  17 g Oral Daily PRN Schnier, Dolores Lory, MD      . protein supplement (ENSURE MAX) liquid  11 oz Oral BID Schnier, Dolores Lory, MD   11 oz at 05/07/19 0846  . sodium bicarbonate tablet 650 mg  650 mg Oral BID Delana Meyer Dolores Lory, MD   650 mg at 05/07/19 0845  . torsemide (DEMADEX) tablet 20 mg  20 mg Oral BID Schnier, Dolores Lory, MD   20 mg at 05/07/19 0844  . vitamin B-12 (CYANOCOBALAMIN) tablet 1,000 mcg  1,000 mcg Oral Daily Schnier, Dolores Lory, MD   1,000 mcg at 05/07/19 0846  . vitamin C (ASCORBIC ACID) tablet 250 mg  250 mg Oral BID Schnier, Dolores Lory, MD   250 mg at 05/07/19 6754     Discharge Medications: Please see discharge summary for a list of discharge medications.  Relevant Imaging Results:  Relevant Lab Results:   Additional Information ss [id number]  Katrina Stack, RN

## 2019-05-07 NOTE — Progress Notes (Signed)
Patient noted to have foley catheter in place.  Spoke with Dr. Trula Slade via phone and received verbal order to DC.

## 2019-05-08 LAB — CBC
HCT: 25 % — ABNORMAL LOW (ref 36.0–46.0)
Hemoglobin: 8 g/dL — ABNORMAL LOW (ref 12.0–15.0)
MCH: 29.4 pg (ref 26.0–34.0)
MCHC: 32 g/dL (ref 30.0–36.0)
MCV: 91.9 fL (ref 80.0–100.0)
Platelets: 187 10*3/uL (ref 150–400)
RBC: 2.72 MIL/uL — ABNORMAL LOW (ref 3.87–5.11)
RDW: 12.8 % (ref 11.5–15.5)
WBC: 9.3 10*3/uL (ref 4.0–10.5)
nRBC: 0 % (ref 0.0–0.2)

## 2019-05-08 LAB — GLUCOSE, CAPILLARY
Glucose-Capillary: 237 mg/dL — ABNORMAL HIGH (ref 70–99)
Glucose-Capillary: 243 mg/dL — ABNORMAL HIGH (ref 70–99)
Glucose-Capillary: 205 mg/dL — ABNORMAL HIGH (ref 70–99)
Glucose-Capillary: 213 mg/dL — ABNORMAL HIGH (ref 70–99)

## 2019-05-08 LAB — BASIC METABOLIC PANEL
Anion gap: 11 (ref 5–15)
BUN: 69 mg/dL — ABNORMAL HIGH (ref 6–20)
CO2: 24 mmol/L (ref 22–32)
Calcium: 8.7 mg/dL — ABNORMAL LOW (ref 8.9–10.3)
Chloride: 100 mmol/L (ref 98–111)
Creatinine, Ser: 3.48 mg/dL — ABNORMAL HIGH (ref 0.44–1.00)
GFR calc Af Amer: 16 mL/min — ABNORMAL LOW (ref >60)
GFR calc non Af Amer: 14 mL/min — ABNORMAL LOW (ref >60)
Glucose, Bld: 205 mg/dL — ABNORMAL HIGH (ref 70–99)
Potassium: 4.6 mmol/L (ref 3.5–5.1)
Sodium: 135 mmol/L (ref 135–145)

## 2019-05-08 LAB — MAGNESIUM: Magnesium: 2.1 mg/dL (ref 1.7–2.4)

## 2019-05-08 MED ORDER — TRAMADOL HCL 50 MG PO TABS
100.0000 mg | ORAL_TABLET | Freq: Four times a day (QID) | ORAL | Status: DC | PRN
  Administered 2019-05-08 – 2019-05-10 (×4): 100 mg via ORAL
  Filled 2019-05-08 (×4): qty 2

## 2019-05-08 NOTE — Progress Notes (Signed)
OT Cancellation Note  Patient Details Name: Rachel Holmes MRN: [id number] DOB: 26-Aug-1961   Cancelled Treatment:    Reason Eval/Treat Not Completed: Pain limiting ability to participate. Pt sleeping, wakes easily to therapist's voice. Upon waking, pt reports 8/10 headache and requesting pain medicine. RN notified. Will re-attempt OT tx at later time to allow for better pain control.   Jeni Salles, MPH, MS, OTR/L ascom [phone] 05/08/19, 1:56 PM

## 2019-05-08 NOTE — Progress Notes (Signed)
Physical Therapy Treatment Patient Details Name: Rachel Holmes MRN: [id number] DOB: December 03, 1960 Today's Date: 05/08/2019    History of Present Illness 58 y/o female admitted for chronic osteomyelitis of L foot resulting in L BKA on 05/05/19.    PT Comments    Overlapping/co-treat with OT.  Pt eager and motivated to do as much mobility and activity as able but was very limited with signficant R knee OA issues.  She reports not having exceptional pain but with most acts (especially in [place]) there was audible grinding and general lack of ability to maintain knee extension/WBing.  Adjusted walker to lower height at facilitate UE WBing/comfort with standing attempts, but ultimately she was unable to tolerate only minimal time standing before needing to sit down with minimal control.  Pt repeatedly asking about a knee brace, per this PT it does not seem likely that a brace of any kind would be both practical and beneficial with WBing given her inability to use L LE for assist in standing. Pt showed great effort with exercises after mobility tasks.    Follow Up Recommendations  SNF     Equipment Recommendations  Wheelchair (measurements PT)(TBD at next venue of care)    Recommendations for Other Services       Precautions / Restrictions Precautions Precautions: Fall Restrictions Weight Bearing Restrictions: Yes LLE Weight Bearing: Non weight bearing    Mobility  Bed Mobility Overal bed mobility: Needs Assistance Bed Mobility: Supine to Sit     Supine to sit: Min guard     General bed mobility comments: sitting EOB on arrival, end of session in recliner  Transfers Overall transfer level: Needs assistance Equipment used: Rolling walker (2 wheeled) Transfers: Sit to/from Stand Sit to Stand: +2 physical assistance;Mod assist         General transfer comment: 3X STS with varying ability to initiate and maintain, +2 with OT, cues for hand/foot placement, locking out BUE.  Pt  unable to maintain standing for more than ~30 seconds each time - on final attempt loud OA grinding sound from R knee with hop attempt. Pt able to perform sit-pivot/ slide transfer with minimal cuing and only CGA.  Ambulation/Gait             General Gait Details: unable/unsafe to attempt hopping/ambulation at this time   Stairs             Wheelchair Mobility    Modified Rankin (Stroke Patients Only)       Balance Overall balance assessment: Needs assistance Sitting-balance support: Feet supported;Feet unsupported;No upper extremity supported Sitting balance-Leahy Scale: Good     Standing balance support: Bilateral upper extremity supported Standing balance-Leahy Scale: Poor Standing balance comment: pt relies heavily on BUE support on RW, R knee does not tolerate WBing well                            Cognition Arousal/Alertness: Awake/alert Behavior During Therapy: WFL for tasks assessed/performed Overall Cognitive Status: Within Functional Limits for tasks assessed                                        Exercises Other Exercises Other Exercises: Supine ther-ex performed on B LE including resisted QS, AAROM SLRs, resisted leg presses/heel slides, seated resisted marching and hip abd/add x 10 reps. Min self  assist required. Other  Exercises: pt rinstructed in desensitization strategies to support optimal pain control as well as prepare for prosthesis use in the future; pt able to return demo technique Other Exercises: pt educated in strategies to support phantom pain    General Comments        Pertinent Vitals/Pain Pain Assessment: Faces(pt reports headache is "better") Pain Score: (c/o L LE residual pain, surprisingly minimal R knee pain) Faces Pain Scale: Hurts little more Pain Location: L residual limb, R knee, headache Pain Descriptors / Indicators: Operative site guarding;Aching;Grimacing;Guarding;Headache Pain  Intervention(s): Limited activity within patient's tolerance;Monitored during session;Premedicated before session;Repositioned    Home Living                      Prior Function            PT Goals (current goals can now be found in the care plan section) Acute Rehab PT Goals Patient Stated Goal: to get stronger Progress towards PT goals: Progressing toward goals    Frequency    7X/week      PT Plan Current plan remains appropriate    Co-evaluation PT/OT/SLP Co-Evaluation/Treatment: Yes Reason for Co-Treatment: For patient/therapist safety;Complexity of the patient's impairments (multi-system involvement) PT goals addressed during session: Mobility/safety with mobility;Balance;Proper use of DME;Strengthening/ROM OT goals addressed during session: Strengthening/ROM;Proper use of Adaptive equipment and DME;ADL's and self-care      AM-PAC PT "6 Clicks" Mobility   Outcome Measure  Help needed turning from your back to your side while in a flat bed without using bedrails?: A Little Help needed moving from lying on your back to sitting on the side of a flat bed without using bedrails?: A Little Help needed moving to and from a bed to a chair (including a wheelchair)?: A Little Help needed standing up from a chair using your arms (e.g., wheelchair or bedside chair)?: Total Help needed to walk in hospital room?: Total Help needed climbing 3-5 steps with a railing? : Total 6 Click Score: 12    End of Session Equipment Utilized During Treatment: Gait belt Activity Tolerance: Patient tolerated treatment well Patient left: with chair alarm set;with call bell/phone within reach Nurse Communication: Mobility status PT Visit Diagnosis: Muscle weakness (generalized) (M62.81);Difficulty in walking, not elsewhere classified (R26.2);Pain Pain - Right/Left: Left Pain - part of body: Leg     Time: 1441-1520 PT Time Calculation (min) (ACUTE ONLY): 39 min  Charges:   $Therapeutic Exercise: 8-22 mins $Therapeutic Activity: 8-22 mins                     Kreg Shropshire, DPT 05/08/2019, 4:30 PM

## 2019-05-08 NOTE — Progress Notes (Signed)
Occupational Therapy Treatment Patient Details Name: Rachel Holmes MRN: [id number] DOB: 02-25-1961 Today's Date: 05/08/2019    History of present illness Pt admitted for chronic osteomyelitis of L foot resulting in L BKA on 05/05/19.   OT comments  Pt seen for OT co-tx this date. Pt agreeable after getting pain medicine earlier for headache. Pt reports continued phantom pain and itching/pain in residual limb. Pt instructed in desensitization strategies and phantom pain strategies. Pt able to return demo strategies. Pt demonstrating improved bed mobility this date requiring no physical assist from therapist to perform supine>sit EOB. PT entered session to work on functional mobility training. With cues for R foot placement and B hand placement with RW, pt able to perform STS on 1st attempt requiring significant +2 assist and unable to fully lock out RLE or BUE on RW and requiring to sit promptly 2/2 R knee pain. With CGA +2, pt able to perform lateral scoots from EOB to drop arm recliner. Additional STS transfers performed with improved performance during final stand attempt with RW lowered to improve ability for pt to fully extend/lock out BUE on RW. Ultimately, pt limited with functional mobility 2/2 R knee pain from "bone on bone." Pt notes eventual plan for R knee replacement after L BKA has healed. Pt continues to benefit from skilled OT services. Continue to recommend CIR, however, pt adamantly declines, requesting SNF at this time. Discharge plan and frequency updated to reflect pt's progress and ability to participate. Will continue to progress.    Follow Up Recommendations  SNF;CIR    Equipment Recommendations  3 in 1 bedside commode    Recommendations for Other Services      Precautions / Restrictions Precautions Precautions: Fall Restrictions Weight Bearing Restrictions: Yes LLE Weight Bearing: Non weight bearing       Mobility Bed Mobility Overal bed mobility: Needs  Assistance Bed Mobility: Supine to Sit     Supine to sit: Min guard        Transfers Overall transfer level: Needs assistance Equipment used: Rolling walker (2 wheeled) Transfers: Sit to/from Stand Sit to Stand: +2 physical assistance;Mod assist         General transfer comment: cues for hand/foot placement, locking out BUE and RLE with 3 STS attempts    Balance Overall balance assessment: Needs assistance Sitting-balance support: Feet supported;Feet unsupported;No upper extremity supported Sitting balance-Leahy Scale: Good     Standing balance support: Bilateral upper extremity supported Standing balance-Leahy Scale: Poor Standing balance comment: pt relies heavily on BUE support on RW                           ADL either performed or assessed with clinical judgement   ADL Overall ADL's : Needs assistance/impaired                                       General ADL Comments: Continues to require Min-Mod A for LB ADL using a lateral lean; able to don R sock at bed level without assist     Vision Baseline Vision/History: Wears glasses Wears Glasses: Reading only Patient Visual Report: No change from baseline     Perception     Praxis      Cognition Arousal/Alertness: Awake/alert Behavior During Therapy: WFL for tasks assessed/performed Overall Cognitive Status: Within Functional Limits for tasks assessed  Exercises Other Exercises Other Exercises: pt rinstructed in desensitization strategies to support optimal pain control as well as prepare for prosthesis use in the future; pt able to return demo technique Other Exercises: pt educated in strategies to support phantom pain   Shoulder Instructions       General Comments      Pertinent Vitals/ Pain       Pain Assessment: Faces(pt reports headache is "better") Faces Pain Scale: Hurts little more Pain Location: L residual  limb, R knee, headache Pain Descriptors / Indicators: Operative site guarding;Aching;Grimacing;Guarding;Headache Pain Intervention(s): Limited activity within patient's tolerance;Monitored during session;Premedicated before session;Repositioned  Home Living                                          Prior Functioning/Environment              Frequency  Min 2X/week        Progress Toward Goals  OT Goals(current goals can now be found in the care plan section)  Progress towards OT goals: Progressing toward goals  Acute Rehab OT Goals Patient Stated Goal: to get stronger OT Goal Formulation: With patient Time For Goal Achievement: 05/20/19 Potential to Achieve Goals: Good  Plan Discharge plan needs to be updated;Frequency needs to be updated    Co-evaluation    PT/OT/SLP Co-Evaluation/Treatment: Yes Reason for Co-Treatment: For patient/therapist safety;To address functional/ADL transfers PT goals addressed during session: Mobility/safety with mobility;Balance;Proper use of DME;Strengthening/ROM OT goals addressed during session: Strengthening/ROM;Proper use of Adaptive equipment and DME;ADL's and self-care      AM-PAC OT "6 Clicks" Daily Activity     Outcome Measure   Help from another person eating meals?: None Help from another person taking care of personal grooming?: None Help from another person toileting, which includes using toliet, bedpan, or urinal?: A Lot Help from another person bathing (including washing, rinsing, drying)?: A Lot Help from another person to put on and taking off regular upper body clothing?: None Help from another person to put on and taking off regular lower body clothing?: A Lot 6 Click Score: 18    End of Session Equipment Utilized During Treatment: Gait belt;Rolling walker  OT Visit Diagnosis: Other abnormalities of gait and mobility (R26.89);Muscle weakness (generalized) (M62.81);Pain Pain - Right/Left: Left Pain  - part of body: Leg   Activity Tolerance Patient tolerated treatment well   Patient Left in chair;Other (comment)(seated in recliner with PT for further treatment)   Nurse Communication          Time: [phone] OT Time Calculation (min): 30 min  Charges: OT General Charges $OT Visit: 1 Visit OT Treatments $Therapeutic Activity: 8-22 mins  Jeni Salles, MPH, MS, OTR/L ascom [phone] 05/08/19, 3:21 PM

## 2019-05-08 NOTE — Progress Notes (Signed)
    Subjective  - POD #3  Doing well   Physical Exam:  Dressing removed.  Incision healing nicely       Assessment/Plan:  POD #3  doing well s/p left BKA Chronic renal disease:  Creatinine fluctuating but remains near baseline levels Acute blood loss anemia:  Hct stable at 25 Diabetes:  Glc remains elevated:  Adjust SSI Rehab / SNF early next week  Wells Jayd Forrey 05/08/2019 12:41 PM --  Vitals:   05/08/19 0518 05/08/19 1145  BP: (!) 168/74 (!) 172/62  Pulse: 77 82  Resp: 20 16  Temp: 98.1 F (36.7 C) 98 F (36.7 C)  SpO2: 96% 97%    Intake/Output Summary (Last 24 hours) at 05/08/2019 1241 Last data filed at 05/08/2019 0900 Gross per 24 hour  Intake 720 ml  Output 1375 ml  Net -655 ml     Laboratory CBC    Component Value Date/Time   WBC 9.3 05/08/2019 0301   HGB 8.0 (L) 05/08/2019 0301   HGB 9.7 (L) 06/25/2018 1050   HCT 25.0 (L) 05/08/2019 0301   HCT 30.3 (L) 06/25/2018 1050   PLT 187 05/08/2019 0301   PLT 210 09/09/2014 1220    BMET    Component Value Date/Time   NA 135 05/08/2019 0301   NA 139 06/25/2018 1050   NA 137 11/21/2013 0627   K 4.6 05/08/2019 0301   K 4.6 11/21/2013 0627   CL 100 05/08/2019 0301   CL 107 11/21/2013 0627   CO2 24 05/08/2019 0301   CO2 24 11/21/2013 0627   GLUCOSE 205 (H) 05/08/2019 0301   GLUCOSE 95 11/21/2013 0627   BUN 69 (H) 05/08/2019 0301   BUN 43 (H) 06/25/2018 1050   BUN 34 (H) 11/21/2013 0627   CREATININE 3.48 (H) 05/08/2019 0301   CREATININE 1.80 (H) 09/09/2014 1220   CALCIUM 8.7 (L) 05/08/2019 0301   CALCIUM 8.8 09/09/2014 1220   GFRNONAA 14 (L) 05/08/2019 0301   GFRNONAA 31 (L) 09/09/2014 1220   GFRNONAA 26 (L) 11/21/2013 0627   GFRAA 16 (L) 05/08/2019 0301   GFRAA 38 (L) 09/09/2014 1220   GFRAA 30 (L) 11/21/2013 0627    COAG Lab Results  Component Value Date   INR 1.1 04/30/2019   No results found for: PTT  Antibiotics Anti-infectives (From admission, onward)   Start     Dose/Rate  Route Frequency Ordered Stop   05/05/19 1400  clindamycin (CLEOCIN) IVPB 300 mg     300 mg 100 mL/hr over 30 Minutes Intravenous Every 8 hours 05/05/19 1103 05/06/19 0549   05/05/19 0739  clindamycin (CLEOCIN) 300 MG/50ML IVPB    Note to Pharmacy: Hallaji, Violet   : cabinet override      05/05/19 0739 05/05/19 0828   05/05/19 0600  clindamycin (CLEOCIN) IVPB 300 mg     300 mg 100 mL/hr over 30 Minutes Intravenous On call to O.R. 05/04/19 2258 05/05/19 1962       V. Leia Alf, M.D., [facility] [place] Office: [phone] Pager:  [phone]

## 2019-05-09 LAB — BASIC METABOLIC PANEL
Anion gap: 8 (ref 5–15)
BUN: 70 mg/dL — ABNORMAL HIGH (ref 6–20)
CO2: 26 mmol/L (ref 22–32)
Calcium: 8.9 mg/dL (ref 8.9–10.3)
Chloride: 101 mmol/L (ref 98–111)
Creatinine, Ser: 3.05 mg/dL — ABNORMAL HIGH (ref 0.44–1.00)
GFR calc Af Amer: 19 mL/min — ABNORMAL LOW (ref >60)
GFR calc non Af Amer: 16 mL/min — ABNORMAL LOW (ref >60)
Glucose, Bld: 235 mg/dL — ABNORMAL HIGH (ref 70–99)
Potassium: 4.8 mmol/L (ref 3.5–5.1)
Sodium: 135 mmol/L (ref 135–145)

## 2019-05-09 LAB — CBC
HCT: 23.2 % — ABNORMAL LOW (ref 36.0–46.0)
Hemoglobin: 7.4 g/dL — ABNORMAL LOW (ref 12.0–15.0)
MCH: 29.1 pg (ref 26.0–34.0)
MCHC: 31.9 g/dL (ref 30.0–36.0)
MCV: 91.3 fL (ref 80.0–100.0)
Platelets: 215 10*3/uL (ref 150–400)
RBC: 2.54 MIL/uL — ABNORMAL LOW (ref 3.87–5.11)
RDW: 12.9 % (ref 11.5–15.5)
WBC: 9 10*3/uL (ref 4.0–10.5)
nRBC: 0 % (ref 0.0–0.2)

## 2019-05-09 LAB — GLUCOSE, CAPILLARY
Glucose-Capillary: 214 mg/dL — ABNORMAL HIGH (ref 70–99)
Glucose-Capillary: 164 mg/dL — ABNORMAL HIGH (ref 70–99)
Glucose-Capillary: 288 mg/dL — ABNORMAL HIGH (ref 70–99)

## 2019-05-09 LAB — MAGNESIUM: Magnesium: 2.2 mg/dL (ref 1.7–2.4)

## 2019-05-09 NOTE — Progress Notes (Addendum)
PT Cancellation Note  Patient Details Name: Rachel Holmes MRN: [id number] DOB: [birth year]   Cancelled Treatment:    Reason Eval/Treat Not Completed: Other (comment)   Offered session x 3 today.  Declined each attempt stating she is too tired and wants to sleep.  Each attempt she asked me to return "later."  Will continue as appropriate.    Chesley Noon 05/09/2019, 1:13 PM

## 2019-05-09 NOTE — Progress Notes (Signed)
    Subjective  - POD #4  No complaints tody No phantom pain  Physical Exam:  Dressing dry       Assessment/Plan:  POD #4  Doing very well, s/p left BKA Hopefully d/c soon / tomorrow  Rachel Holmes 05/09/2019 2:44 PM --  Vitals:   05/08/19 2117 05/09/19 0623  BP: (!) 175/67 (!) 151/64  Pulse: 81 73  Resp: 20 20  Temp: 98.3 F (36.8 C) 98.2 F (36.8 C)  SpO2: 97% 100%    Intake/Output Summary (Last 24 hours) at 05/09/2019 1444 Last data filed at 05/09/2019 1205 Gross per 24 hour  Intake -  Output 1900 ml  Net -1900 ml     Laboratory CBC    Component Value Date/Time   WBC 9.0 05/09/2019 0443   HGB 7.4 (L) 05/09/2019 0443   HGB 9.7 (L) 06/25/2018 1050   HCT 23.2 (L) 05/09/2019 0443   HCT 30.3 (L) 06/25/2018 1050   PLT 215 05/09/2019 0443   PLT 210 09/09/2014 1220    BMET    Component Value Date/Time   NA 135 05/09/2019 0443   NA 139 06/25/2018 1050   NA 137 11/21/2013 0627   K 4.8 05/09/2019 0443   K 4.6 11/21/2013 0627   CL 101 05/09/2019 0443   CL 107 11/21/2013 0627   CO2 26 05/09/2019 0443   CO2 24 11/21/2013 0627   GLUCOSE 235 (H) 05/09/2019 0443   GLUCOSE 95 11/21/2013 0627   BUN 70 (H) 05/09/2019 0443   BUN 43 (H) 06/25/2018 1050   BUN 34 (H) 11/21/2013 0627   CREATININE 3.05 (H) 05/09/2019 0443   CREATININE 1.80 (H) 09/09/2014 1220   CALCIUM 8.9 05/09/2019 0443   CALCIUM 8.8 09/09/2014 1220   GFRNONAA 16 (L) 05/09/2019 0443   GFRNONAA 31 (L) 09/09/2014 1220   GFRNONAA 26 (L) 11/21/2013 0627   GFRAA 19 (L) 05/09/2019 0443   GFRAA 38 (L) 09/09/2014 1220   GFRAA 30 (L) 11/21/2013 0627    COAG Lab Results  Component Value Date   INR 1.1 04/30/2019   No results found for: PTT  Antibiotics Anti-infectives (From admission, onward)   Start     Dose/Rate Route Frequency Ordered Stop   05/05/19 1400  clindamycin (CLEOCIN) IVPB 300 mg     300 mg 100 mL/hr over 30 Minutes Intravenous Every 8 hours 05/05/19 1103 05/06/19 0549   05/05/19 0739  clindamycin (CLEOCIN) 300 MG/50ML IVPB    Note to Pharmacy: Hallaji, Violet   : cabinet override      05/05/19 0739 05/05/19 0828   05/05/19 0600  clindamycin (CLEOCIN) IVPB 300 mg     300 mg 100 mL/hr over 30 Minutes Intravenous On call to O.R. 05/04/19 2258 05/05/19 0932       V. Leia Alf, M.D., [facility] [place] Office: [phone] Pager:  [phone]

## 2019-05-10 DIAGNOSIS — M869 Osteomyelitis, unspecified: Secondary | ICD-10-CM

## 2019-05-10 DIAGNOSIS — I739 Peripheral vascular disease, unspecified: Secondary | ICD-10-CM

## 2019-05-10 DIAGNOSIS — I96 Gangrene, not elsewhere classified: Secondary | ICD-10-CM

## 2019-05-10 DIAGNOSIS — A5216 Charcot's arthropathy (tabetic): Secondary | ICD-10-CM

## 2019-05-10 LAB — GLUCOSE, CAPILLARY
Glucose-Capillary: 231 mg/dL — ABNORMAL HIGH (ref 70–99)
Glucose-Capillary: 200 mg/dL — ABNORMAL HIGH (ref 70–99)
Glucose-Capillary: 203 mg/dL — ABNORMAL HIGH (ref 70–99)
Glucose-Capillary: 149 mg/dL — ABNORMAL HIGH (ref 70–99)
Glucose-Capillary: 170 mg/dL — ABNORMAL HIGH (ref 70–99)

## 2019-05-10 LAB — SURGICAL PATHOLOGY

## 2019-05-10 LAB — MAGNESIUM: Magnesium: 2.2 mg/dL (ref 1.7–2.4)

## 2019-05-10 LAB — CBC
HCT: 22.8 % — ABNORMAL LOW (ref 36.0–46.0)
Hemoglobin: 7.2 g/dL — ABNORMAL LOW (ref 12.0–15.0)
MCH: 28.7 pg (ref 26.0–34.0)
MCHC: 31.6 g/dL (ref 30.0–36.0)
MCV: 90.8 fL (ref 80.0–100.0)
Platelets: 227 10*3/uL (ref 150–400)
RBC: 2.51 MIL/uL — ABNORMAL LOW (ref 3.87–5.11)
RDW: 12.9 % (ref 11.5–15.5)
WBC: 8.5 10*3/uL (ref 4.0–10.5)
nRBC: 0 % (ref 0.0–0.2)

## 2019-05-10 LAB — BASIC METABOLIC PANEL
Anion gap: 12 (ref 5–15)
BUN: 83 mg/dL — ABNORMAL HIGH (ref 6–20)
CO2: 26 mmol/L (ref 22–32)
Calcium: 8.6 mg/dL — ABNORMAL LOW (ref 8.9–10.3)
Chloride: 98 mmol/L (ref 98–111)
Creatinine, Ser: 3.47 mg/dL — ABNORMAL HIGH (ref 0.44–1.00)
GFR calc Af Amer: 16 mL/min — ABNORMAL LOW (ref >60)
GFR calc non Af Amer: 14 mL/min — ABNORMAL LOW (ref >60)
Glucose, Bld: 224 mg/dL — ABNORMAL HIGH (ref 70–99)
Potassium: 4.7 mmol/L (ref 3.5–5.1)
Sodium: 136 mmol/L (ref 135–145)

## 2019-05-10 MED ORDER — INSULIN DETEMIR 100 UNIT/ML ~~LOC~~ SOLN: 9.0000 [IU] | Freq: Every day | SUBCUTANEOUS | 11 refills | Status: DC

## 2019-05-10 MED ORDER — TRAMADOL HCL 50 MG PO TABS: ORAL_TABLET | ORAL | 0 refills | Status: DC

## 2019-05-10 MED ORDER — INSULIN ASPART 100 UNIT/ML ~~LOC~~ SOLN: 0.0000 [IU] | Freq: Every day | SUBCUTANEOUS | 11 refills | Status: DC

## 2019-05-10 MED ORDER — DOCUSATE SODIUM 100 MG PO CAPS: 100.0000 mg | ORAL_CAPSULE | Freq: Two times a day (BID) | ORAL | 0 refills | Status: AC

## 2019-05-10 MED ORDER — ENSURE MAX PROTEIN PO LIQD: 11.0000 [oz_av] | Freq: Two times a day (BID) | ORAL | Status: DC

## 2019-05-10 MED ORDER — TRAMADOL HCL 50 MG PO TABS: 50.0000 mg | ORAL_TABLET | Freq: Four times a day (QID) | ORAL | Status: DC | PRN

## 2019-05-10 MED ORDER — ASCORBIC ACID 250 MG PO TABS: 250.0000 mg | ORAL_TABLET | Freq: Two times a day (BID) | ORAL | Status: DC

## 2019-05-10 MED ORDER — INSULIN ASPART 100 UNIT/ML ~~LOC~~ SOLN: 3.0000 [IU] | Freq: Three times a day (TID) | SUBCUTANEOUS | 11 refills | Status: DC

## 2019-05-10 MED ORDER — ASPIRIN 81 MG PO TBEC: 81.0000 mg | DELAYED_RELEASE_TABLET | Freq: Every day | ORAL | Status: DC

## 2019-05-10 MED ORDER — INSULIN ASPART 100 UNIT/ML ~~LOC~~ SOLN: 0.0000 [IU] | Freq: Three times a day (TID) | SUBCUTANEOUS | 11 refills | Status: DC

## 2019-05-10 NOTE — Discharge Summary (Addendum)
[facility]    Discharge Summary  Patient ID:  Rachel Holmes MRN: [id number] DOB/AGE: [birth year] 58 y.o.  Admit date: 05/05/2019 Discharge date: 05/12/2019 Date of Surgery: 05/05/2019 Surgeon: Surgeon(s): Schnier, Dolores Lory, MD Lucky Cowboy Erskine Squibb, MD  Admission Diagnosis: CHARCOT OF ANKLE OSTEOMYELITIS  Discharge Diagnoses:  CHARCOT OF ANKLE OSTEOMYELITIS  Secondary Diagnoses: Past Medical History:  Diagnosis Date  . Allergic rhinitis   . Anemia   . Anxiety   . Arthritis   . Back pain   . Bradycardia   . Breast mass    Patient can no longer palpate specific masses but showed tech general area of concern  . CHF (congestive heart failure) ([place])   . CKD (chronic kidney disease)    STAGE 3  . Constipation   . Diabetes mellitus without complication ([place])   . Diabetic nephropathy ([place])   . Dyspnea   . GERD (gastroesophageal reflux disease)   . Heart murmur   . HLD (hyperlipidemia)   . Hyperparathyroidism ([place])   . Hypertension   . Joint pain   . Leg edema   . Legally blind in left eye, as defined in [place]   . Lymphedema   . Obesity   . Onychomycosis    Procedure(s): AMPUTATION BELOW KNEE (LEFT)  Discharged Condition: Stable  HPI / Hospital Course:  The patient is a 58 year old female with multiple medical issues including severe peripheral artery disease of the left lower extremity.  The patient presented with left leg gangrene and on May 05, 2019, the patient underwent a left below the knee amputation.  The patient tolerated the procedure well was transferred to the surgical floor without complication.  The patient's night of surgery was unremarkable.  During the patient's inpatient stay she received physical therapy and Occupational Therapy.  Recommendation was discharge to acute rehab however the patient refused this option preferring to be discharged to a skilled nursing facility particularly [facility].  Patient was transfused one unit  PRBC on 05/11/19 for a Hbg of 6.9. Repeat CBC next morning with a Hbg of 8.2.   On discharge, the patient was afebrile with stable vital signs.  The patient was urinating independently, her pain was controlled with use of p.o. pain medications, she was tolerating regular diet and ambulating with assistance.  Physical exam:  A&Ox3, NAD CV: RRR Pulm: Clear to auscultation bilaterally Abdomen: Soft, nontender, nondistended, (+) bowel sounds Left lower extremity: Stump is healing well.  Sutures intact clean and dry.  Stump is warm without any breakdown of skin or ecchymosis.   Document Information Photos    05/10/2019 11:12  Attached To:  Hospital Encounter on 05/05/19  Source Information Earnesteen Birnie, Janalyn Harder, PA-C  [facility]   Labs as below  Complications: None  Consults:  Treatment Team:  Nicholes Mango, MD Physical Therapy Occupational Therapy  Significant Diagnostic Studies: CBC Lab Results  Component Value Date   WBC 7.4 05/12/2019   HGB 8.2 (L) 05/12/2019   HCT 25.3 (L) 05/12/2019   MCV 89.7 05/12/2019   PLT 260 05/12/2019   BMET    Component Value Date/Time   NA 139 05/12/2019 0529   NA 139 06/25/2018 1050   NA 137 11/21/2013 0627   K 4.8 05/12/2019 0529   K 4.6 11/21/2013 0627   CL 101 05/12/2019 0529   CL 107 11/21/2013 0627   CO2 32 05/12/2019 0529   CO2 24 11/21/2013 0627   GLUCOSE 219 (H) 05/12/2019 0529  GLUCOSE 95 11/21/2013 0627   BUN 72 (H) 05/12/2019 0529   BUN 43 (H) 06/25/2018 1050   BUN 34 (H) 11/21/2013 0627   CREATININE 2.99 (H) 05/12/2019 0529   CREATININE 1.80 (H) 09/09/2014 1220   CALCIUM 8.8 (L) 05/12/2019 0529   CALCIUM 8.8 09/09/2014 1220   GFRNONAA 17 (L) 05/12/2019 0529   GFRNONAA 31 (L) 09/09/2014 1220   GFRNONAA 26 (L) 11/21/2013 0627   GFRAA 19 (L) 05/12/2019 0529   GFRAA 38 (L) 09/09/2014 1220   GFRAA 30 (L) 11/21/2013 0627   COAG Lab Results  Component Value Date   INR 1.1 04/30/2019   Disposition:   Discharge to :Skilled nursing facility  Allergies as of 05/12/2019      Reactions   Statins Shortness Of Breath, Other (See Comments)   Wheezing, short of breath   Penicillins Hives, Other (See Comments)   Has patient had a PCN reaction causing immediate rash, facial/tongue/throat swelling, SOB or lightheadedness with hypotension: No Has patient had a PCN reaction causing severe rash involving mucus membranes or skin necrosis: No Has patient had a PCN reaction that required hospitalization No Has patient had a PCN reaction occurring within the last 10 years: No If all of the above answers are "NO", then may proceed with Cephalosporin use.   Codeine Nausea Only   Ibuprofen Other (See Comments)   Reaction:  Raises pts BP   Percocet [oxycodone-acetaminophen] Nausea Only   Tramadol Nausea Only, Other (See Comments)   Can take if she has eaten. Currently takes with good results   Vicodin [hydrocodone-acetaminophen] Itching, Nausea Only      Medication List    STOP taking these medications   insulin aspart 100 UNIT/ML FlexPen Commonly known as: NovoLOG FlexPen Replaced by: insulin aspart 100 UNIT/ML injection     TAKE these medications   albuterol 108 (90 Base) MCG/ACT inhaler Commonly known as: VENTOLIN HFA Inhale 1-2 puffs into the lungs every 6 (six) hours as needed for wheezing or shortness of breath.   amLODipine 10 MG tablet Commonly known as: NORVASC Take 10 mg by mouth daily.   ascorbic acid 250 MG tablet Commonly known as: VITAMIN C Take 1 tablet (250 mg total) by mouth 2 (two) times daily.   aspirin 81 MG EC tablet Take 1 tablet (81 mg total) by mouth daily. What changed:   medication strength  how much to take   clobetasol cream 0.05 % Commonly known as: TEMOVATE Apply 1 application topically 2 (two) times daily.   cloNIDine 0.3 MG tablet Commonly known as: CATAPRES Take 0.3 mg by mouth 2 (two) times daily.   Demadex 20 MG tablet Generic drug:  torsemide Take 1 tablet (20 mg total) by mouth 2 (two) times daily. Take an extra 45m tablet daily prn in the morning for swelling or sob.   docusate sodium 100 MG capsule Commonly known as: COLACE Take 1 capsule (100 mg total) by mouth 2 (two) times daily.   Dulaglutide 1.5 MG/0.5ML Sopn Commonly known as: Trulicity Inject 1.5 mg into the skin once a week. What changed: when to take this   DULoxetine 30 MG capsule Commonly known as: CYMBALTA Take 30 mg by mouth daily.   Ensure Max Protein Liqd Take 330 mLs (11 oz total) by mouth 2 (two) times daily.   fluticasone 50 MCG/ACT nasal spray Commonly known as: FLONASE Place 1 spray into both nostrils daily.   gabapentin 600 MG tablet Commonly known as: NEURONTIN Take 600 mg by  mouth at bedtime.   glucosamine-chondroitin 500-400 MG tablet Take 1 tablet by mouth 2 (two) times daily.   glucose blood test strip Commonly known as: OneTouch Verio 1 each by Other route 2 (two) times a day. Use to monitor glucose levels BID; E11.42   insulin aspart 100 UNIT/ML injection Commonly known as: novoLOG Inject 0-9 Units into the skin 3 (three) times daily with meals. Replaces: insulin aspart 100 UNIT/ML FlexPen   insulin aspart 100 UNIT/ML injection Commonly known as: novoLOG Inject 0-5 Units into the skin at bedtime.   insulin aspart 100 UNIT/ML injection Commonly known as: novoLOG Inject 3 Units into the skin 3 (three) times daily with meals.   insulin detemir 100 UNIT/ML injection Commonly known as: LEVEMIR Inject 0.09 mLs (9 Units total) into the skin at bedtime.   Insulin Pen Needle 32G X 6 MM Misc Commonly known as: BD Pen Needle Micro U/F 1 each by Does not apply route 3 (three) times daily.   isosorbide mononitrate 60 MG 24 hr tablet Commonly known as: IMDUR Take 1 tablet (60 mg total) by mouth 2 (two) times a day.   isosorbide mononitrate 30 MG 24 hr tablet Commonly known as: IMDUR Take 1 tablet (30 mg total) by  mouth 2 (two) times daily.   multivitamin with minerals Tabs tablet Take 1 tablet by mouth daily.   omeprazole 40 MG capsule Commonly known as: PRILOSEC Take 40 mg by mouth 2 (two) times daily.   ondansetron 4 MG tablet Commonly known as: ZOFRAN Take 4 mg by mouth every 8 (eight) hours as needed for nausea or vomiting.   ONE TOUCH ULTRA 2 w/Device Kit 1 Device by Does not apply route daily.   onetouch ultrasoft lancets Used to check blood sugars four times daily.   sodium bicarbonate 650 MG tablet Take 1 tablet (650 mg total) by mouth 2 (two) times daily.   traMADol 50 MG tablet Commonly known as: ULTRAM One to Two Tabs Every Six Hours As Needed For Pain What changed:   how much to take  how to take this  when to take this  reasons to take this  additional instructions   vitamin B-12 1000 MCG tablet Commonly known as: CYANOCOBALAMIN Take 1,000 mcg by mouth daily.            Durable Medical Equipment  (From admission, onward)         Start     Ordered   05/10/19 0954  For home use only DME standard manual wheelchair with seat cushion  Once    Comments: Patient suffers from a recent below the knee which impairs their ability to perform daily activities like ambulating independently in the home.  A walker / cane will not resolve issue with performing activities of daily living. A wheelchair will allow patient to safely perform daily activities. Patient can safely propel the wheelchair in the home or has a caregiver who can provide assistance. Length of need approximately three months.  Accessories: elevating leg rests (ELRs), wheel locks, extensions and anti-tippers.   05/10/19 0955   05/10/19 0953  For home use only DME 3 n 1  Once     05/10/19 0955         Verbal and written Discharge instructions given to the patient. Wound care per Discharge AVS  Contact information for follow-up providers    Schnier, Dolores Lory, MD. Go on 05/24/2019.   Specialties:  Vascular Surgery, Cardiology, Radiology, Vascular Surgery Why: _0 :30PM  Can see midlevel. First post-op amputation check.  Contact information: [place] [address] [phone]            Contact information for after-discharge care    [facility] .   Service: Skilled Nursing Contact information: [place] [phone]                 Signed: Sela Hua, PA-C  05/12/2019, 9:15 AM

## 2019-05-10 NOTE — TOC Progression Note (Addendum)
Transition of Care [facility]) - Progression Note    Patient Details  Name: Rachel Holmes MRN: [id number] Date of Birth: 1961-04-24  Transition of [facility]) CM/SW Contact  Beverly Sessions, RN Phone Number: 05/10/2019, 9:52 AM  Clinical Narrative:    Sent Updated PT notes to Timoteo Ace at [place]  Update:  Notified MD and PA that [place] needs to be signed.   SNF insurance [place] pending   Expected Discharge Plan: [place] Barriers to Discharge: Continued Medical Work up  Expected Discharge Plan and Services Expected Discharge Plan: [place]   Discharge Planning Services: CM Consult   Living arrangements for the past 2 months: [place]                                       Social Determinants of Health (SDOH) Interventions    Readmission Risk Interventions Readmission Risk Prevention Plan 05/06/2019  Transportation Screening Complete  Medication Review Press photographer) Complete  Some recent data might be hidden

## 2019-05-10 NOTE — Progress Notes (Signed)
Occupational Therapy Treatment Patient Details Name: Rachel Holmes MRN: [id number] DOB: 05/27/1961 Today's Date: 05/10/2019    History of present illness (P) 58 y/o female admitted for chronic osteomyelitis of L foot resulting in L BKA on 05/05/19.(Simultaneous filing. User may not have seen previous data.)   OT comments  Pt seen for OT tx, co-tx with PT. Pt denies residual limb pain, received seated EOB preparing for transfer training upon OT's arrival. Pt instructed in transfer technique with reminder for elbow extension to help "lock out" her arms on the RW to support her in coming to full upright posture. Max +2 to perform with verbal cues for anterior weight shift on RLE to improve balance. Pt sits without warning requiring cues for safety and on final stand>sit did better in slowly lowering herself down. Pt progressing towards goals. Continues to be limited by R knee instability and weakness. Pt denies pain/discomfort but audible creaking/cracking noted in R knee during standing. Continue to recommend STR.   Follow Up Recommendations  SNF    Equipment Recommendations  3 in 1 bedside commode    Recommendations for Other Services      Precautions / Restrictions Precautions Precautions: Fall Restrictions Weight Bearing Restrictions: Yes LLE Weight Bearing: Non weight bearing       Mobility Bed Mobility Overal bed mobility: Needs Assistance Bed Mobility: Sit to Supine     Supine to sit: (P) Modified independent (Device/Increase time) Sit to supine: Min assist      Transfers Overall transfer level: Needs assistance Equipment used: Rolling walker (2 wheeled) Transfers: Sit to/from Stand Sit to Stand: +2 physical assistance;Max assist         General transfer comment: +2 assist for STS trials EOB with cues for anterior weight shift and elbow extension to push through RW to improve stability    Balance Overall balance assessment: Needs assistance Sitting-balance  support: Feet supported;Feet unsupported;No upper extremity supported Sitting balance-Leahy Scale: Good     Standing balance support: Bilateral upper extremity supported Standing balance-Leahy Scale: Poor Standing balance comment: pt relies heavily on BUE support on RW, R knee does not tolerate WBing well                           ADL either performed or assessed with clinical judgement   ADL Overall ADL's : Needs assistance/impaired                                       General ADL Comments: Continues to require Min-Mod A for LB ADL using a lateral lean     Vision Baseline Vision/History: Wears glasses Wears Glasses: Reading only Patient Visual Report: No change from baseline     Perception     Praxis      Cognition Arousal/Alertness: Awake/alert Behavior During Therapy: WFL for tasks assessed/performed Overall Cognitive Status: Within Functional Limits for tasks assessed                                          Exercises     Shoulder Instructions       General Comments      Pertinent Vitals/ Pain       Pain Assessment: No/denies pain  Home Living  Prior Functioning/Environment              Frequency  Min 2X/week        Progress Toward Goals  OT Goals(current goals can now be found in the care plan section)  Progress towards OT goals: Progressing toward goals  Acute Rehab OT Goals Patient Stated Goal: to get stronger OT Goal Formulation: With patient Time For Goal Achievement: 05/20/19 Potential to Achieve Goals: Good  Plan Discharge plan remains appropriate;Frequency remains appropriate    Co-evaluation    PT/OT/SLP Co-Evaluation/Treatment: Yes Reason for Co-Treatment: For patient/therapist safety;To address functional/ADL transfers PT goals addressed during session: Mobility/safety with mobility;Balance;Proper use of  DME;Strengthening/ROM OT goals addressed during session: Proper use of Adaptive equipment and DME;ADL's and self-care      AM-PAC OT "6 Clicks" Daily Activity     Outcome Measure   Help from another person eating meals?: None Help from another person taking care of personal grooming?: None Help from another person toileting, which includes using toliet, bedpan, or urinal?: A Lot Help from another person bathing (including washing, rinsing, drying)?: A Lot Help from another person to put on and taking off regular upper body clothing?: None Help from another person to put on and taking off regular lower body clothing?: A Lot 6 Click Score: 18    End of Session Equipment Utilized During Treatment: Gait belt;Rolling walker  OT Visit Diagnosis: Other abnormalities of gait and mobility (R26.89);Muscle weakness (generalized) (M62.81)   Activity Tolerance Patient tolerated treatment well   Patient Left in bed;with call bell/phone within reach;with bed alarm set;with nursing/sitter in room   Nurse Communication          Time: 2122-4825 OT Time Calculation (min): 10 min  Charges: OT General Charges $OT Visit: 1 Visit OT Treatments $Therapeutic Activity: 8-22 mins  Jeni Salles, MPH, MS, OTR/L ascom [phone] 05/10/19, 3:58 PM

## 2019-05-10 NOTE — Progress Notes (Signed)
Nutrition Follow Up Note   DOCUMENTATION CODES:   Morbid obesity  INTERVENTION:   Ensure Max protein supplement BID, each supplement provides 150kcal and 30g of protein.  MVI daily   Vitamin C 250mg  po BID   NUTRITION DIAGNOSIS:   Increased nutrient needs related to wound healing as evidenced by increased estimated needs.  GOAL:   Patient will meet greater than or equal to 90% of their needs  -progressing   MONITOR:   PO intake, Supplement acceptance, Labs, Weight trends, Skin, I & O's  ASSESSMENT:   58 y/o female with h/o DM, CHF, CKD IV admitted with L foot osteomyelitis now s/p L BKA  RD working remotely.  Pt continues to do well; pt eating 100% of meals and drinking Ensure supplements. Recommend continue supplements and vitamins until post-op healing is complete. Per chart, pt is weight stable since admit.   Medications reviewed and include: aspirin, colace, lovenox, insulin, MVI, protonix, Na bicarb, torsemide, B12, vitamin C  Labs reviewed: K 4.7 wnl, Mg 2.2 wnl Wbc- Hgb 7.2(L), Hct 22.8(L) cbgs- 214, 164, 231, 288, 200 x 24 hrs AIC 8.4- 3/20  Diet Order:   Diet Order            Diet Carb Modified Fluid consistency: Thin; Room service appropriate? Yes  Diet effective now             EDUCATION NEEDS:   Education needs have been addressed  Skin:  Skin Assessment: Reviewed RN Assessment(Incision L leg)  Last BM:  7/6  Height:   Ht Readings from Last 1 Encounters:  05/05/19 5\' 8"  (1.727 m)    Weight:   Wt Readings from Last 1 Encounters:  05/09/19 (!) 138.7 kg    Ideal Body Weight:  63.6 kg  BMI:  Body mass index is 46.48 kg/m.  Estimated Nutritional Needs:   Kcal:  2300-2600kcal/day  Protein:  115-130g/day  Fluid:  >1.9L/day  Koleen Distance MS, RD, LDN Pager #- [phone] Office#- [phone] After Hours Pager: [phone]

## 2019-05-10 NOTE — Discharge Instructions (Signed)
Vascular Surgery Discharge Instructions 1) Please apply dry Kerlix to AKA stump daily or sooner if drainage is noted.

## 2019-05-10 NOTE — Care Management Important Message (Signed)
Important Message  Patient Details  Name: Rachel Holmes MRN: [id number] Date of Birth: 07-21-1961   Medicare Important Message Given:  Yes     [name] 05/10/2019, 11:09 AM

## 2019-05-10 NOTE — TOC Progression Note (Deleted)
Transition of Care [facility]) - Progression Note    Patient Details  Name: NAUTICA HOTZ MRN: [id number] Date of Birth: 04-12-1961  Transition of Care [facility]) CM/SW Contact  Beverly Sessions, RN Phone Number: 05/10/2019, 3:34 PM  Clinical Narrative:    Plan for discharge to [facility].  Insurance auth pending    Expected Discharge Plan: Skilled Nursing Facility Barriers to Discharge: Barriers Resolved  Expected Discharge Plan and Services Expected Discharge Plan: [place]   Discharge Planning Services: CM Consult   Living arrangements for the past 2 months: [place] Expected Discharge Date: 05/10/19                                     Social Determinants of Health (SDOH) Interventions    Readmission Risk Interventions Readmission Risk Prevention Plan 05/10/2019 05/06/2019  Transportation Screening Complete Complete  PCP or Specialist Appt within 3-5 Days (No Data) -  HRI or Home Care Consult Complete -  Palliative Care Screening Not Applicable -  Medication Review (RN Care Manager) Complete Complete  Some recent data might be hidden

## 2019-05-10 NOTE — Progress Notes (Signed)
Physical Therapy Treatment Patient Details Name: Rachel Holmes MRN: [id number] DOB: 22-Dec-1960 Today's Date: 05/10/2019    History of Present Illness 58 y/o female admitted for chronic osteomyelitis of L foot resulting in L BKA on 05/05/19.    PT Comments    Pt sleeping soundly upon arrival; awakens to voice and touch. Pt agreeable to PT; denies pain. Pt participates with supine BLE exercises with assist as needed. Encouraged performing exercises multiple times a day. Pt demonstrates Mod I supine to sit with good sitting balance edge of bed. Co treatment with OT for STS transfers. Max A of 2 from elevated surface with heavy encouragement for QS/GS to attain stand. Pt limited by arthritic R knee with audible crepitus. Pt has difficulty balancing with posterior lean despite cues for anterior weight shift and weight distribution through [place]. Initial sit with uncontrolled descent; improved post education on second stand. Pt stands for approximately 30 seconds per stand. Overall Mod A for return to bed and repositioning upward; pt does demonstrate good effort and assist for return to bed mobility. Continue PT to progress strength, endurance and balance to improve all functional mobility.    Follow Up Recommendations  SNF     Equipment Recommendations  Wheelchair (measurements PT)    Recommendations for Other Services       Precautions / Restrictions Precautions Precautions: Fall Restrictions Weight Bearing Restrictions: Yes LLE Weight Bearing: Non weight bearing    Mobility  Bed Mobility Overal bed mobility: Needs Assistance Bed Mobility: Sit to Supine     Supine to sit: Modified independent (Device/Increase time) Sit to supine: Min assist   General bed mobility comments: Pt gives good effort to return to bed with Min A for BLEs; end up low in bed. Pt uses BUEs and RLE to assist with good effort; ultimately requires Mod of 2 to reposition upward in bed. Does demonstrate good bridge  to center hips in bed  Transfers Overall transfer level: Needs assistance Equipment used: Rolling walker (2 wheeled) Transfers: Sit to/from Stand Sit to Stand: +2 physical assistance;Max assist         General transfer comment: +2 assist for STS trials EOB with cues for anterior weight shift and elbow extension to push through RW to improve stability  Ambulation/Gait             General Gait Details: unable   Stairs             Wheelchair Mobility    Modified Rankin (Stroke Patients Only)       Balance Overall balance assessment: Needs assistance Sitting-balance support: Feet supported;Feet unsupported;No upper extremity supported Sitting balance-Leahy Scale: Good     Standing balance support: Bilateral upper extremity supported Standing balance-Leahy Scale: Poor Standing balance comment: pt relies heavily on BUE support on RW, R knee does not tolerate WBing well                            Cognition Arousal/Alertness: Awake/alert Behavior During Therapy: WFL for tasks assessed/performed Overall Cognitive Status: Within Functional Limits for tasks assessed                                        Exercises General Exercises - Lower Extremity Ankle Circles/Pumps: AROM;Right;15 reps Quad Sets: Strengthening;Both;15 reps Gluteal Sets: Strengthening;Both;15 reps Short Arc Quad: AROM;Both;15 reps Heel Slides: AROM;Both;15  reps Hip ABduction/ADduction: AROM;Both;15 reps Straight Leg Raises: AAROM;Both;10 reps    General Comments        Pertinent Vitals/Pain Pain Assessment: No/denies pain    Home Living                      Prior Function            PT Goals (current goals can now be found in the care plan section) Acute Rehab PT Goals Patient Stated Goal: to get stronger Progress towards PT goals: Progressing toward goals(slowly)    Frequency    7X/week      PT Plan Current plan remains  appropriate    Co-evaluation   Reason for Co-Treatment: For patient/therapist safety;To address functional/ADL transfers PT goals addressed during session: Mobility/safety with mobility;Balance;Proper use of DME;Strengthening/ROM OT goals addressed during session: Proper use of Adaptive equipment and DME;ADL's and self-care      AM-PAC PT "6 Clicks" Mobility   Outcome Measure  Help needed turning from your back to your side while in a flat bed without using bedrails?: A Little Help needed moving from lying on your back to sitting on the side of a flat bed without using bedrails?: A Little Help needed moving to and from a bed to a chair (including a wheelchair)?: A Lot Help needed standing up from a chair using your arms (e.g., wheelchair or bedside chair)?: A Lot Help needed to walk in hospital room?: Total Help needed climbing 3-5 steps with a railing? : Total 6 Click Score: 12    End of Session Equipment Utilized During Treatment: Gait belt Activity Tolerance: Patient tolerated treatment well;Patient limited by fatigue;Other (comment)(Arthritic R knee) Patient left: in bed;with call bell/phone within reach;with bed alarm set   PT Visit Diagnosis: Muscle weakness (generalized) (M62.81);Difficulty in walking, not elsewhere classified (R26.2);Pain Pain - Right/Left: Left Pain - part of body: Leg     Time: 0076-2263 PT Time Calculation (min) (ACUTE ONLY): 27 min Co treat There Act with OT  Charges:  $Therapeutic Exercise: 8-22 mins $Therapeutic Activity: 8-22 mins                      Larae Grooms, PTA 05/10/2019, 4:06 PM

## 2019-05-10 NOTE — NC FL2 (Signed)
[place] LEVEL OF CARE SCREENING TOOL     IDENTIFICATION  Patient Name: Rachel Holmes Birthdate: 12/20/1960 Sex: female Admission Date (Current Location): 05/05/2019  [place] and [place] Number:  Engineering geologist and Address:  [facility], [address], [place], [place] [address]      Provider Number: [id number]  Attending Physician Name and Address:  Katha Cabal, MD  Relative Name and Phone Number:       Current Level of Care: Hospital Recommended Level of Care: [place] Prior Approval Number:    Date Approved/Denied:   PASRR Number: Pending  Discharge Plan: SNF    Current Diagnoses: Patient Active Problem List   Diagnosis Date Noted  . Chronic osteomyelitis of ankle and foot, left ([place]) 05/05/2019  . Chronic diastolic CHF (congestive heart failure) ([place]) 04/19/2019  . Leg edema 04/19/2019  . Acute bacterial endocarditis 04/19/2019  . Chest pain of uncertain etiology 89/21/1941  . Ulcer of left ankle ([place]) 04/14/2019  . Aortic valve endocarditis   . Bacteremia due to Streptococcus 12/24/2018  . Malignant hypertension (arteriolar nephrosclerosis), stage 1-4 or unspecified chronic kidney disease 07/15/2018  . Stage 4 chronic kidney disease ([place]) 07/15/2018  . Morbid obesity with BMI of 45.0-49.9, adult ([place]) 07/15/2018  . Excessive daytime sleepiness 07/15/2018  . Other fatigue 03/11/2018  . Essential hypertension 03/11/2018  . Vitamin D deficiency 03/11/2018  . Congestive heart failure ([place]) 03/11/2018  . Degenerative arthritis of right knee 01/06/2018  . Uncontrolled type 2 diabetes mellitus with polyneuropathy ([place]) 10/03/2017  . Knee pain 10/02/2017  . Obesity 10/02/2017  . Charcot's joint of left foot 08/29/2017  . Lymphedema 09/06/2016  . Hyponatremia with extracellular fluid depletion 02/02/2016  . Nausea with vomiting, unspecified 02/02/2016  . Closed nondisplaced fracture of right  patella 08/23/2015  . Acute cystitis without hematuria 08/14/2014  . Colitis 08/14/2014  . HTN (hypertension), malignant 08/14/2014  . Acute renal failure superimposed on stage 3 chronic kidney disease ([place]) 06/26/2014  . Hyperparathyroidism, unspecified ([place]) 02/24/2014  . Allergic rhinitis 02/22/2014  . Onychomycosis 02/22/2014  . Anxiety 11/05/2013  . Bradycardia 05/14/2013  . Gastro-esophageal reflux disease without esophagitis 04/05/2013  . Anemia in other chronic diseases classified elsewhere 01/27/2013    Orientation RESPIRATION BLADDER Height & Weight     Self, Time, Situation, Place  Normal Continent Weight: (!) 138.7 kg Height:  5\' 8"  (172.7 cm)  BEHAVIORAL SYMPTOMS/MOOD NEUROLOGICAL BOWEL NUTRITION STATUS      Continent Diet(Carb modified)  AMBULATORY STATUS COMMUNICATION OF NEEDS Skin   Extensive Assist Verbally Surgical wounds                       Personal Care Assistance Level of Assistance  Bathing, Feeding, Dressing Bathing Assistance: Limited assistance Feeding assistance: Independent Dressing Assistance: Limited assistance     Functional Limitations Info  Sight Sight Info: Impaired        SPECIAL CARE FACTORS FREQUENCY  PT (By licensed PT), OT (By licensed OT)                    Contractures Contractures Info: Not present    Additional Factors Info  Code Status, Allergies Code Status Info: Full Allergies Info: Penicillin, codeine, ibuprofen, percocet, tramadol, vicodin           Current Medications (05/10/2019):  This is the current hospital active medication list Current Facility-Administered Medications  Medication Dose Route Frequency Provider Last  Rate Last Dose  . acetaminophen (TYLENOL) tablet 650 mg  650 mg Oral Q6H PRN Schnier, Dolores Lory, MD   650 mg at 05/08/19 1359   Or  . acetaminophen (TYLENOL) suppository 650 mg  650 mg Rectal Q6H PRN Schnier, Dolores Lory, MD      . albuterol (PROVENTIL) (2.5 MG/3ML) 0.083% nebulizer  solution 2.5 mg  2.5 mg Nebulization Q6H PRN Rito Ehrlich A, RPH   2.5 mg at 05/08/19 1123  . amLODipine (NORVASC) tablet 10 mg  10 mg Oral Daily Schnier, Dolores Lory, MD   10 mg at 05/10/19 0919  . aspirin EC tablet 81 mg  81 mg Oral Daily Schnier, Dolores Lory, MD   81 mg at 05/10/19 0925  . cloNIDine (CATAPRES) tablet 0.3 mg  0.3 mg Oral BID Schnier, Dolores Lory, MD   0.3 mg at 05/10/19 0914  . diphenhydrAMINE (BENADRYL) capsule 25 mg  25 mg Oral Q6H PRN Serafina Mitchell, MD   25 mg at 05/10/19 0437  . docusate sodium (COLACE) capsule 100 mg  100 mg Oral BID Delana Meyer Dolores Lory, MD   100 mg at 05/10/19 0912  . Dulaglutide SOPN 1.5 mg  1.5 mg Subcutaneous Q Sun Stegmayer, Kimberly A, PA-C   1.5 mg at 05/09/19 1604  . DULoxetine (CYMBALTA) DR capsule 30 mg  30 mg Oral Daily Schnier, Dolores Lory, MD   30 mg at 05/10/19 0918  . enoxaparin (LOVENOX) injection 30 mg  30 mg Subcutaneous Q24H Dallie Piles, RPH   30 mg at 05/10/19 4270  . fluticasone (FLONASE) 50 MCG/ACT nasal spray 1 spray  1 spray Each Nare Daily Schnier, Dolores Lory, MD   1 spray at 05/09/19 [phone]  . gabapentin (NEURONTIN) tablet 600 mg  600 mg Oral QHS Schnier, Dolores Lory, MD   600 mg at 05/09/19 2124  . hydrocortisone cream 1 %   Topical TID Schnier, Dolores Lory, MD      . HYDROmorphone (DILAUDID) injection 1 mg  1 mg Intravenous Q2H PRN Schnier, Dolores Lory, MD   1 mg at 05/06/19 2249  . insulin aspart (novoLOG) injection 0-5 Units  0-5 Units Subcutaneous QHS Stegmayer, Kimberly A, PA-C   3 Units at 05/09/19 2136  . insulin aspart (novoLOG) injection 0-9 Units  0-9 Units Subcutaneous TID WC Stegmayer, Kimberly A, PA-C   2 Units at 05/10/19 0913  . insulin aspart (novoLOG) injection 3 Units  3 Units Subcutaneous TID WC Stegmayer, Kimberly A, PA-C   3 Units at 05/09/19 1817  . insulin detemir (LEVEMIR) injection 9 Units  9 Units Subcutaneous QHS Stegmayer, Kimberly A, PA-C   9 Units at 05/07/19 2106  . isosorbide mononitrate (IMDUR) 24 hr tablet 30  mg  30 mg Oral BID Schnier, Dolores Lory, MD   30 mg at 05/10/19 0916  . isosorbide mononitrate (IMDUR) 24 hr tablet 60 mg  60 mg Oral BID Delana Meyer Dolores Lory, MD   60 mg at 05/10/19 0920  . multivitamin with minerals tablet 1 tablet  1 tablet Oral Daily Schnier, Dolores Lory, MD   1 tablet at 05/10/19 0915  . ondansetron (ZOFRAN) tablet 4 mg  4 mg Oral Q6H PRN Schnier, Dolores Lory, MD       Or  . ondansetron [facility]) injection 4 mg  4 mg Intravenous Q6H PRN Schnier, Dolores Lory, MD      . pantoprazole (PROTONIX) EC tablet 40 mg  40 mg Oral Daily Schnier, Dolores Lory, MD   40 mg  at 05/10/19 0919  . polyethylene glycol (MIRALAX / GLYCOLAX) packet 17 g  17 g Oral Daily PRN Schnier, Dolores Lory, MD   17 g at 05/10/19 1049  . protein supplement (ENSURE MAX) liquid  11 oz Oral BID Schnier, Dolores Lory, MD   11 oz at 05/10/19 1049  . sodium bicarbonate tablet 650 mg  650 mg Oral BID Delana Meyer Dolores Lory, MD   650 mg at 05/10/19 0920  . torsemide (DEMADEX) tablet 20 mg  20 mg Oral BID Schnier, Dolores Lory, MD   20 mg at 05/10/19 4604  . traMADol (ULTRAM) tablet 100 mg  100 mg Oral Q6H PRN Serafina Mitchell, MD   100 mg at 05/10/19 0252  . traMADol (ULTRAM) tablet 50 mg  50 mg Oral Q6H PRN Schnier, Dolores Lory, MD      . vitamin B-12 (CYANOCOBALAMIN) tablet 1,000 mcg  1,000 mcg Oral Daily Schnier, Dolores Lory, MD   1,000 mcg at 05/10/19 [phone]  . vitamin C (ASCORBIC ACID) tablet 250 mg  250 mg Oral BID Schnier, Dolores Lory, MD   500 mg at 05/10/19 7215     Discharge Medications: Please see discharge summary for a list of discharge medications.  Relevant Imaging Results:  Relevant Lab Results:   Additional Information ss [id number]  Beverly Sessions, RN

## 2019-05-11 LAB — BASIC METABOLIC PANEL
Anion gap: 9 (ref 5–15)
BUN: 82 mg/dL — ABNORMAL HIGH (ref 6–20)
CO2: 27 mmol/L (ref 22–32)
Calcium: 8.4 mg/dL — ABNORMAL LOW (ref 8.9–10.3)
Chloride: 101 mmol/L (ref 98–111)
Creatinine, Ser: 3.31 mg/dL — ABNORMAL HIGH (ref 0.44–1.00)
GFR calc Af Amer: 17 mL/min — ABNORMAL LOW (ref >60)
GFR calc non Af Amer: 15 mL/min — ABNORMAL LOW (ref >60)
Glucose, Bld: 204 mg/dL — ABNORMAL HIGH (ref 70–99)
Potassium: 4.6 mmol/L (ref 3.5–5.1)
Sodium: 137 mmol/L (ref 135–145)

## 2019-05-11 LAB — GLUCOSE, CAPILLARY
Glucose-Capillary: 154 mg/dL — ABNORMAL HIGH (ref 70–99)
Glucose-Capillary: 232 mg/dL — ABNORMAL HIGH (ref 70–99)
Glucose-Capillary: 131 mg/dL — ABNORMAL HIGH (ref 70–99)
Glucose-Capillary: 156 mg/dL — ABNORMAL HIGH (ref 70–99)

## 2019-05-11 LAB — CBC
HCT: 21.6 % — ABNORMAL LOW (ref 36.0–46.0)
Hemoglobin: 6.9 g/dL — ABNORMAL LOW (ref 12.0–15.0)
MCH: 29 pg (ref 26.0–34.0)
MCHC: 31.9 g/dL (ref 30.0–36.0)
MCV: 90.8 fL (ref 80.0–100.0)
Platelets: 231 10*3/uL (ref 150–400)
RBC: 2.38 MIL/uL — ABNORMAL LOW (ref 3.87–5.11)
RDW: 13 % (ref 11.5–15.5)
WBC: 7.6 10*3/uL (ref 4.0–10.5)
nRBC: 0 % (ref 0.0–0.2)

## 2019-05-11 LAB — PREPARE RBC (CROSSMATCH)

## 2019-05-11 LAB — HEMOGLOBIN AND HEMATOCRIT, BLOOD
HCT: 25.7 % — ABNORMAL LOW (ref 36.0–46.0)
Hemoglobin: 8.4 g/dL — ABNORMAL LOW (ref 12.0–15.0)

## 2019-05-11 LAB — MAGNESIUM: Magnesium: 2.3 mg/dL (ref 1.7–2.4)

## 2019-05-11 LAB — SARS CORONAVIRUS 2 BY RT PCR (HOSPITAL ORDER, PERFORMED IN ~~LOC~~ HOSPITAL LAB): SARS Coronavirus 2: NEGATIVE

## 2019-05-11 MED ORDER — INSULIN DETEMIR 100 UNIT/ML ~~LOC~~ SOLN
12.0000 [IU] | Freq: Every day | SUBCUTANEOUS | Status: DC
  Administered 2019-05-11: 12 [IU] via SUBCUTANEOUS
  Filled 2019-05-11 (×2): qty 0.12

## 2019-05-11 MED ORDER — INSULIN ASPART 100 UNIT/ML ~~LOC~~ SOLN
5.0000 [IU] | Freq: Three times a day (TID) | SUBCUTANEOUS | Status: DC
  Administered 2019-05-11 – 2019-05-12 (×3): 5 [IU] via SUBCUTANEOUS
  Filled 2019-05-11 (×3): qty 1

## 2019-05-11 MED ORDER — SODIUM CHLORIDE 0.9% IV SOLUTION
Freq: Once | INTRAVENOUS | Status: AC
  Administered 2019-05-11: 16:00:00 via INTRAVENOUS

## 2019-05-11 MED ORDER — DIPHENHYDRAMINE HCL 50 MG/ML IJ SOLN
25.0000 mg | Freq: Once | INTRAMUSCULAR | Status: AC
  Administered 2019-05-11: 25 mg via INTRAVENOUS
  Filled 2019-05-11: qty 1

## 2019-05-11 MED ORDER — ACETAMINOPHEN 325 MG PO TABS
650.0000 mg | ORAL_TABLET | Freq: Once | ORAL | Status: AC
  Administered 2019-05-11: 650 mg via ORAL
  Filled 2019-05-11: qty 2

## 2019-05-11 MED ORDER — HYDROMORPHONE HCL 1 MG/ML IJ SOLN: 1.0000 mg | INTRAMUSCULAR | Status: DC | PRN

## 2019-05-11 NOTE — Progress Notes (Signed)
OT Cancellation Note  Patient Details Name: ALISSIA LORY MRN: [id number] DOB: 11/15/1960   Cancelled Treatment:    Reason Eval/Treat Not Completed: Medical issues which prohibited therapy. Chart reviewed.  HgB 6.9   HCT 21.6. Currently receiving blood transfusion.  Will hold per therapy parameters and continue as appropriate.  Jeni Salles, MPH, MS, OTR/L ascom [phone] 05/11/19, 4:45 PM

## 2019-05-11 NOTE — Progress Notes (Signed)
Inpatient Diabetes Program Recommendations  AACE/ADA: New Consensus Statement on Inpatient Glycemic Control (2015)  Target Ranges:  Prepandial:   less than 140 mg/dL      Peak postprandial:   less than 180 mg/dL (1-2 hours)      Critically ill patients:  140 - 180 mg/dL   Lab Results  Component Value Date   GLUCAP 232 (H) 05/11/2019   HGBA1C 7.3 (H) 05/06/2019    Review of Glycemic Control Results for Rachel Holmes, Rachel Holmes (MRN [id number]) as of 05/11/2019 14:29  Ref. Range 05/09/2019 17:05 05/09/2019 21:21 05/10/2019 08:11 05/10/2019 12:24 05/10/2019 16:59 05/10/2019 21:49 05/11/2019 07:23 05/11/2019 12:03  Glucose-Capillary Latest Ref Range: 70 - 99 mg/dL 231 (H) 288 (H) 200 (H) 203 (H) 149 (H) 170 (H) 154 (H) 232 (H)   Diabetes history: DM 2 Outpatient Diabetes medications:  Trulicity 1.5 weekly, Novolog 10 units with breakfast, 14 units with lunch, and 12 units with supper Current orders for Inpatient glycemic control:  Novolog sensitive tid with meals and HS Novolog 3 units tid with meals  Trulicity 1.5 mg weekly Levemir 9 units q HS Inpatient Diabetes Program Recommendations:    Please consider increasing Levemir to 12 units q HS.  Also consider increasing Novolog meal coverage to 5 units tid with meals (hold if patient eats less than 50%).   Thanks,  Adah Perl, RN, BC-ADM Inpatient Diabetes Coordinator Pager [phone] (8a-5p)

## 2019-05-11 NOTE — TOC Progression Note (Signed)
Transition of Care [facility]) - Progression Note    Patient Details  Name: Rachel Holmes MRN: [id number] Date of Birth: 02-12-1961  Transition of Care [facility]) CM/SW Contact  Beverly Sessions, RN Phone Number: 05/11/2019, 10:16 AM  Clinical Narrative:    Plan for discharge to [facility]. Insurance auth pending    Expected Discharge Plan: Skilled Nursing Facility Barriers to Discharge: Barriers Resolved  Expected Discharge Plan and Services Expected Discharge Plan: [place]   Discharge Planning Services: CM Consult   Living arrangements for the past 2 months: Single Family Home Expected Discharge Date: 05/10/19                                     Social Determinants of Health (SDOH) Interventions    Readmission Risk Interventions Readmission Risk Prevention Plan 05/10/2019 05/06/2019  Transportation Screening Complete Complete  PCP or Specialist Appt within 3-5 Days (No Data) -  HRI or Home Care Consult Complete -  Palliative Care Screening Not Applicable -  Medication Review (RN Care Manager) Complete Complete  Some recent data might be hidden

## 2019-05-11 NOTE — Progress Notes (Addendum)
[facility] Daily Progress Note   Subjective: 6 Days Post-Op: Left below-the-knee amputation  Patient without complaint. Sleeping but easily arousable.   Objective: Vitals:   05/10/19 1520 05/10/19 1948 05/11/19 0500 05/11/19 0522  BP:  (!) 160/68  (!) 161/71  Pulse: 72 77  63  Resp:  20  16  Temp:  98.3 F (36.8 C)  98.7 F (37.1 C)  TempSrc:  Oral    SpO2: 98% 100%  98%  Weight:   (!) 137 kg   Height:        Intake/Output Summary (Last 24 hours) at 05/11/2019 1059 Last data filed at 05/11/2019 0900 Gross per 24 hour  Intake 1200 ml  Output 850 ml  Net 350 ml   Physical Exam: A&Ox3, NAD CV: RRR Pulmonary: CTA Bilaterally Abdomen: Soft, Nontender, Nondistended Vascular:  Left Lower Extremity: Thigh soft. Knee flexible at the joint. Incision is clean, dry and intact. Stump healthy. Dressing is dry.    Laboratory: CBC    Component Value Date/Time   WBC 7.6 05/11/2019 0455   HGB 6.9 (L) 05/11/2019 0455   HGB 9.7 (L) 06/25/2018 1050   HCT 21.6 (L) 05/11/2019 0455   HCT 30.3 (L) 06/25/2018 1050   PLT 231 05/11/2019 0455   PLT 210 09/09/2014 1220   BMET    Component Value Date/Time   NA 137 05/11/2019 0455   NA 139 06/25/2018 1050   NA 137 11/21/2013 0627   K 4.6 05/11/2019 0455   K 4.6 11/21/2013 0627   CL 101 05/11/2019 0455   CL 107 11/21/2013 0627   CO2 27 05/11/2019 0455   CO2 24 11/21/2013 0627   GLUCOSE 204 (H) 05/11/2019 0455   GLUCOSE 95 11/21/2013 0627   BUN 82 (H) 05/11/2019 0455   BUN 43 (H) 06/25/2018 1050   BUN 34 (H) 11/21/2013 0627   CREATININE 3.31 (H) 05/11/2019 0455   CREATININE 1.80 (H) 09/09/2014 1220   CALCIUM 8.4 (L) 05/11/2019 0455   CALCIUM 8.8 09/09/2014 1220   GFRNONAA 15 (L) 05/11/2019 0455   GFRNONAA 31 (L) 09/09/2014 1220   GFRNONAA 26 (L) 11/21/2013 0627   GFRAA 17 (L) 05/11/2019 0455   GFRAA 38 (L) 09/09/2014 1220   GFRAA 30 (L) 11/21/2013 3810   Assessment/Planning: The patient is a 58 year old  female status post a left below the knee amputation postop day #6 1) Still awaiting SNF bed approval and placement 2) Hbg: 6.9 today, asymptomatic however will transfuse one unit to keep Hbg above 7 3) Kidney function stable 4) Continue PT / OT  5) Appreciate nutrition / diabetes following / recommendations 6) Will order new COVID test for SNF placement (last completed 6/26)  Discussed with Tamera Stands PA-C 05/11/2019 10:59 AM

## 2019-05-11 NOTE — TOC Progression Note (Addendum)
Transition of Care [facility]) - Progression Note    Patient Details  Name: Rachel Holmes MRN: [id number] Date of Birth: 03-11-1961  [facility]) CM/SW Contact  Beverly Sessions, RN Phone Number: 05/11/2019, 3:06 PM  Clinical Narrative:    Updated clinical sent to [facility] at [facility] [phone] [id number]   Expected Discharge Plan: [place] Barriers to Discharge: Barriers Resolved  Expected Discharge Plan and Services Expected Discharge Plan: [place]   Discharge Planning Services: CM Consult   Living arrangements for the past 2 months: Single Family Home Expected Discharge Date: 05/10/19                                     Social Determinants of Health (SDOH) Interventions    Readmission Risk Interventions Readmission Risk Prevention Plan 05/10/2019 05/06/2019  Transportation Screening Complete Complete  PCP or Specialist Appt within 3-5 Days (No Data) -  HRI or Home Care Consult Complete -  Palliative Care Screening Not Applicable -  Medication Review (RN Care Manager) Complete Complete  Some recent data might be hidden

## 2019-05-11 NOTE — TOC Progression Note (Signed)
Transition of Care [facility] [place] [facility]) - Progression Note    Patient Details  Name: Rachel Holmes MRN: [id number] Date of Birth: November 18, 1960  Transition of Care [facility]) CM/SW Contact  Beverly Sessions, RN Phone Number: 05/11/2019, 10:17 AM  Clinical Narrative:    Plan for discharge to [facility].  Insurance auth pending    Expected Discharge Plan: Skilled Nursing Facility Barriers to Discharge: Barriers Resolved  Expected Discharge Plan and Services Expected Discharge Plan: [place]   Discharge Planning Services: CM Consult   Living arrangements for the past 2 months: Single Family Home Expected Discharge Date: 05/10/19                                     Social Determinants of Health (SDOH) Interventions    Readmission Risk Interventions Readmission Risk Prevention Plan 05/10/2019 05/06/2019  Transportation Screening Complete Complete  PCP or Specialist Appt within 3-5 Days (No Data) -  HRI or Home Care Consult Complete -  Palliative Care Screening Not Applicable -  Medication Review (RN Care Manager) Complete Complete  Some recent data might be hidden

## 2019-05-11 NOTE — Progress Notes (Signed)
PT Cancellation Note  Patient Details Name: Rachel Holmes MRN: [id number] DOB: [birth year]   Cancelled Treatment:    Reason Eval/Treat Not Completed: Medical issues which prohibited therapy   Chart reviewed.  HgB 6.9   HCT 21.6  Plan for transfusion today.  Will hold per therapy parameters and continue as appropriate.    Chesley Noon 05/11/2019, 1:20 PM

## 2019-05-11 NOTE — Progress Notes (Addendum)
PT Cancellation Note  Patient Details Name: Rachel Holmes MRN: [id number] DOB: 06-28-1961   Cancelled Treatment:    Reason Eval/Treat Not Completed: Medical issues which prohibited therapy   Chart reviewed.  HgB 6.9 HCT 21.6  Will hold at this time per therapy protocols and continue as appropriate.    Chesley Noon 05/11/2019, 8:45 AM

## 2019-05-12 DIAGNOSIS — I739 Peripheral vascular disease, unspecified: Secondary | ICD-10-CM | POA: Diagnosis not present

## 2019-05-12 DIAGNOSIS — J309 Allergic rhinitis, unspecified: Secondary | ICD-10-CM | POA: Diagnosis not present

## 2019-05-12 DIAGNOSIS — I5032 Chronic diastolic (congestive) heart failure: Secondary | ICD-10-CM | POA: Diagnosis not present

## 2019-05-12 DIAGNOSIS — E1122 Type 2 diabetes mellitus with diabetic chronic kidney disease: Secondary | ICD-10-CM | POA: Diagnosis not present

## 2019-05-12 DIAGNOSIS — E1121 Type 2 diabetes mellitus with diabetic nephropathy: Secondary | ICD-10-CM | POA: Diagnosis not present

## 2019-05-12 DIAGNOSIS — I130 Hypertensive heart and chronic kidney disease with heart failure and stage 1 through stage 4 chronic kidney disease, or unspecified chronic kidney disease: Secondary | ICD-10-CM | POA: Diagnosis not present

## 2019-05-12 DIAGNOSIS — N183 Chronic kidney disease, stage 3 (moderate): Secondary | ICD-10-CM | POA: Diagnosis not present

## 2019-05-12 DIAGNOSIS — M1711 Unilateral primary osteoarthritis, right knee: Secondary | ICD-10-CM | POA: Diagnosis not present

## 2019-05-12 DIAGNOSIS — E559 Vitamin D deficiency, unspecified: Secondary | ICD-10-CM | POA: Diagnosis not present

## 2019-05-12 DIAGNOSIS — Z4781 Encounter for orthopedic aftercare following surgical amputation: Secondary | ICD-10-CM | POA: Diagnosis not present

## 2019-05-12 DIAGNOSIS — F419 Anxiety disorder, unspecified: Secondary | ICD-10-CM | POA: Diagnosis not present

## 2019-05-12 DIAGNOSIS — K219 Gastro-esophageal reflux disease without esophagitis: Secondary | ICD-10-CM | POA: Diagnosis not present

## 2019-05-12 DIAGNOSIS — Z23 Encounter for immunization: Secondary | ICD-10-CM | POA: Diagnosis present

## 2019-05-12 DIAGNOSIS — D638 Anemia in other chronic diseases classified elsewhere: Secondary | ICD-10-CM | POA: Diagnosis not present

## 2019-05-12 DIAGNOSIS — E1142 Type 2 diabetes mellitus with diabetic polyneuropathy: Secondary | ICD-10-CM | POA: Diagnosis not present

## 2019-05-12 DIAGNOSIS — E213 Hyperparathyroidism, unspecified: Secondary | ICD-10-CM | POA: Diagnosis not present

## 2019-05-12 DIAGNOSIS — E785 Hyperlipidemia, unspecified: Secondary | ICD-10-CM | POA: Diagnosis not present

## 2019-05-12 DIAGNOSIS — Z89512 Acquired absence of left leg below knee: Secondary | ICD-10-CM | POA: Diagnosis not present

## 2019-05-12 LAB — BPAM RBC
Blood Product Expiration Date: 202008062359
ISSUE DATE / TIME: 202007071609
Unit Type and Rh: 6200

## 2019-05-12 LAB — BASIC METABOLIC PANEL
Anion gap: 6 (ref 5–15)
BUN: 72 mg/dL — ABNORMAL HIGH (ref 6–20)
CO2: 32 mmol/L (ref 22–32)
Calcium: 8.8 mg/dL — ABNORMAL LOW (ref 8.9–10.3)
Chloride: 101 mmol/L (ref 98–111)
Creatinine, Ser: 2.99 mg/dL — ABNORMAL HIGH (ref 0.44–1.00)
GFR calc Af Amer: 19 mL/min — ABNORMAL LOW (ref >60)
GFR calc non Af Amer: 17 mL/min — ABNORMAL LOW (ref >60)
Glucose, Bld: 219 mg/dL — ABNORMAL HIGH (ref 70–99)
Potassium: 4.8 mmol/L (ref 3.5–5.1)
Sodium: 139 mmol/L (ref 135–145)

## 2019-05-12 LAB — TYPE AND SCREEN
ABO/RH(D): A POS
Antibody Screen: NEGATIVE
Unit division: 0

## 2019-05-12 LAB — CBC
HCT: 25.3 % — ABNORMAL LOW (ref 36.0–46.0)
Hemoglobin: 8.2 g/dL — ABNORMAL LOW (ref 12.0–15.0)
MCH: 29.1 pg (ref 26.0–34.0)
MCHC: 32.4 g/dL (ref 30.0–36.0)
MCV: 89.7 fL (ref 80.0–100.0)
Platelets: 260 10*3/uL (ref 150–400)
RBC: 2.82 MIL/uL — ABNORMAL LOW (ref 3.87–5.11)
RDW: 12.7 % (ref 11.5–15.5)
WBC: 7.4 10*3/uL (ref 4.0–10.5)
nRBC: 0 % (ref 0.0–0.2)

## 2019-05-12 LAB — MAGNESIUM: Magnesium: 2.4 mg/dL (ref 1.7–2.4)

## 2019-05-12 LAB — GLUCOSE, CAPILLARY
Glucose-Capillary: 130 mg/dL — ABNORMAL HIGH (ref 70–99)
Glucose-Capillary: 206 mg/dL — ABNORMAL HIGH (ref 70–99)
Glucose-Capillary: 160 mg/dL — ABNORMAL HIGH (ref 70–99)

## 2019-05-12 LAB — SARS CORONAVIRUS 2 BY RT PCR (HOSPITAL ORDER, PERFORMED IN ~~LOC~~ HOSPITAL LAB): SARS Coronavirus 2: NEGATIVE

## 2019-05-12 MED ORDER — HYDRALAZINE HCL 10 MG PO TABS
10.0000 mg | ORAL_TABLET | Freq: Once | ORAL | Status: AC
  Administered 2019-05-12: 10 mg via ORAL
  Filled 2019-05-12: qty 1

## 2019-05-12 NOTE — TOC Transition Note (Signed)
Transition of Care [facility]) - CM/SW Discharge Note   Patient Details  Name: BOSTON CATARINO MRN: [id number] Date of Birth: 08-Mar-1961  [facility]) CM/SW Contact:  Beverly Sessions, RN Phone Number: 05/12/2019, 1:58 PM   Clinical Narrative:     Patient to discharge today Insurance auth received from [place].   Notified by Magda Paganini at [facility] that the facility is now requiring negative covid test on day of discharge.   MD notified.  Bedside RN to re test.  Patient to transport by EMS.  Packet on chart. Bedside RN to notify family  Final next level of care: Skilled Nursing Facility Barriers to Discharge: Barriers Resolved   Patient Goals and CMS Choice        Discharge Placement              Patient chooses bed at: [facility] Patient to be transferred to facility by: EMS Name of family member notified: Bedside RN to notify family    Discharge Plan and Services   Discharge Planning Services: CM Consult                                 Social Determinants of Health (SDOH) Interventions     Readmission Risk Interventions Readmission Risk Prevention Plan 05/10/2019 05/06/2019  Transportation Screening Complete Complete  PCP or Specialist Appt within 3-5 Days (No Data) -  HRI or Home Care Consult Complete -  Palliative Care Screening Not Applicable -  Medication Review (RN Care Manager) Complete Complete  Some recent data might be hidden

## 2019-05-13 DIAGNOSIS — I739 Peripheral vascular disease, unspecified: Secondary | ICD-10-CM | POA: Insufficient documentation

## 2019-05-13 DIAGNOSIS — S88112A Complete traumatic amputation at level between knee and ankle, left lower leg, initial encounter: Secondary | ICD-10-CM | POA: Insufficient documentation

## 2019-05-24 ENCOUNTER — Other Ambulatory Visit: Payer: Self-pay

## 2019-05-24 ENCOUNTER — Encounter (INDEPENDENT_AMBULATORY_CARE_PROVIDER_SITE_OTHER): Payer: Self-pay | Admitting: Vascular Surgery

## 2019-05-24 ENCOUNTER — Ambulatory Visit (INDEPENDENT_AMBULATORY_CARE_PROVIDER_SITE_OTHER): Payer: Medicare HMO | Admitting: Vascular Surgery

## 2019-05-24 VITALS — BP 145/66 | HR 63 | Resp 16 | Ht 68.0 in | Wt 290.0 lb

## 2019-05-24 DIAGNOSIS — Z89512 Acquired absence of left leg below knee: Secondary | ICD-10-CM | POA: Insufficient documentation

## 2019-05-24 DIAGNOSIS — Z8739 Personal history of other diseases of the musculoskeletal system and connective tissue: Secondary | ICD-10-CM

## 2019-05-24 DIAGNOSIS — M86672 Other chronic osteomyelitis, left ankle and foot: Secondary | ICD-10-CM

## 2019-05-24 NOTE — Progress Notes (Signed)
Patient ID: Rachel Holmes, female   DOB: January 15, 1961, 58 y.o.   MRN: [id number]  No chief complaint on file.   HPI Rachel Holmes is a 58 y.o. female.    The patient returns to the office s/p left BKA on 05/05/2019.  Patient states her postoperative pain is under good control.  She is not having huge problems with phantom pains.  She does note that her leg feels very tight and swollen.  No fevers no chills.  No drainage from the incision.   Past Medical History:  Diagnosis Date  . Allergic rhinitis   . Anemia   . Anxiety   . Arthritis   . Back pain   . Bradycardia   . Breast mass    Patient can no longer palpate specific masses but showed tech general area of concern  . CHF (congestive heart failure) ([place])   . CKD (chronic kidney disease)    STAGE 3  . Constipation   . Diabetes mellitus without complication ([place])   . Diabetic nephropathy ([place])   . Dyspnea   . GERD (gastroesophageal reflux disease)   . Heart murmur   . HLD (hyperlipidemia)   . Hyperparathyroidism ([place])   . Hypertension   . Joint pain   . Leg edema   . Legally blind in left eye, as defined in [place]   . Lymphedema   . Obesity   . Onychomycosis     Past Surgical History:  Procedure Laterality Date  . ABDOMINAL HYSTERECTOMY    . AMPUTATION Left 05/05/2019   Procedure: AMPUTATION BELOW KNEE;  Surgeon: Katha Cabal, MD;  Location: [facility] ORS;  Service: Vascular;  Laterality: Left;  . BREAST BIOPSY Left 2014   FNA 12:00 position - Negative  . EYE SURGERY Left 2007   removed a lens, no lens implanted  . IR FLUORO GUIDE CV LINE RIGHT  12/29/2018  . IR REMOVAL TUN CV CATH W/O FL  02/19/2019  . IR US GUIDE VASC ACCESS RIGHT  12/29/2018  . TEE WITHOUT CARDIOVERSION N/A 12/28/2018   Procedure: TRANSESOPHAGEAL ECHOCARDIOGRAM (TEE);  Surgeon: Sueanne Margarita, MD;  Location[facility] END[facility];  Service: Cardiovascular;  Laterality: N/A;  . VAGINAL HYSTERECTOMY     abdominal hyst, not a vaginal hyst       Allergies  Allergen Reactions  . Statins Shortness Of Breath and Other (See Comments)    Wheezing, short of breath  . Penicillins Hives and Other (See Comments)    Has patient had a PCN reaction causing immediate rash, facial/tongue/throat swelling, SOB or lightheadedness with hypotension: No Has patient had a PCN reaction causing severe rash involving mucus membranes or skin necrosis: No Has patient had a PCN reaction that required hospitalization No Has patient had a PCN reaction occurring within the last 10 years: No If all of the above answers are "NO", then may proceed with Cephalosporin use.  . Codeine Nausea Only  . Ibuprofen Other (See Comments)    Reaction:  Raises pts BP  . Percocet [Oxycodone-Acetaminophen] Nausea Only  . Tramadol Nausea Only and Other (See Comments)    Can take if she has eaten. Currently takes with good results   . Vicodin [Hydrocodone-Acetaminophen] Itching and Nausea Only    Current Outpatient Medications  Medication Sig Dispense Refill  . albuterol (PROVENTIL HFA;VENTOLIN HFA) 108 (90 Base) MCG/ACT inhaler Inhale 1-2 puffs into the lungs every 6 (six) hours as needed for wheezing or shortness of breath.    [name]  amLODipine (NORVASC) 10 MG tablet Take 10 mg by mouth daily.    [name] aspirin EC 81 MG EC tablet Take 1 tablet (81 mg total) by mouth daily.    . Blood Glucose Monitoring Suppl (ONE TOUCH ULTRA 2) w/Device KIT 1 Device by Does not apply route daily. (Patient not taking: Reported on 04/27/2019) 1 each 0  . clobetasol cream (TEMOVATE) 0.93 % Apply 1 application topically 2 (two) times daily.    . cloNIDine (CATAPRES) 0.3 MG tablet Take 0.3 mg by mouth 2 (two) times daily.    [name] docusate sodium (COLACE) 100 MG capsule Take 1 capsule (100 mg total) by mouth 2 (two) times daily. 10 capsule 0  . Dulaglutide (TRULICITY) 1.5 GH/8.2XH SOPN Inject 1.5 mg into the skin once a week. (Patient taking differently: Inject 1.5 mg into the skin every Sunday. ) 4 pen 11  .  DULoxetine (CYMBALTA) 30 MG capsule Take 30 mg by mouth daily.     . Ensure Max Protein (ENSURE MAX PROTEIN) LIQD Take 330 mLs (11 oz total) by mouth 2 (two) times daily.    . fluticasone (FLONASE) 50 MCG/ACT nasal spray Place 1 spray into both nostrils daily.     [name] gabapentin (NEURONTIN) 600 MG tablet Take 600 mg by mouth at bedtime.    [name] glucosamine-chondroitin 500-400 MG tablet Take 1 tablet by mouth 2 (two) times daily.    [name] glucose blood (ONETOUCH VERIO) test strip 1 each by Other route 2 (two) times a day. Use to monitor glucose levels BID; E11.42 (Patient not taking: Reported on 04/27/2019) 100 each 3  . insulin aspart (NOVOLOG) 100 UNIT/ML injection Inject 0-9 Units into the skin 3 (three) times daily with meals. 10 mL 11  . insulin aspart (NOVOLOG) 100 UNIT/ML injection Inject 0-5 Units into the skin at bedtime. 10 mL 11  . insulin aspart (NOVOLOG) 100 UNIT/ML injection Inject 3 Units into the skin 3 (three) times daily with meals. 10 mL 11  . insulin detemir (LEVEMIR) 100 UNIT/ML injection Inject 0.09 mLs (9 Units total) into the skin at bedtime. 10 mL 11  . Insulin Pen Needle (BD PEN NEEDLE MICRO U/F) 32G X 6 MM MISC 1 each by Does not apply route 3 (three) times daily. (Patient not taking: Reported on 04/27/2019) 100 each 3  . isosorbide mononitrate (IMDUR) 30 MG 24 hr tablet Take 1 tablet (30 mg total) by mouth 2 (two) times daily. 180 tablet 3  . isosorbide mononitrate (IMDUR) 60 MG 24 hr tablet Take 1 tablet (60 mg total) by mouth 2 (two) times a day. 180 tablet 3  . Lancets (ONETOUCH ULTRASOFT) lancets Used to check blood sugars four times daily. (Patient not taking: Reported on 04/27/2019) 200 each 12  . Multiple Vitamin (MULTIVITAMIN WITH MINERALS) TABS tablet Take 1 tablet by mouth daily.    [name] omeprazole (PRILOSEC) 40 MG capsule Take 40 mg by mouth 2 (two) times daily.    . ondansetron (ZOFRAN) 4 MG tablet Take 4 mg by mouth every 8 (eight) hours as needed for nausea or vomiting.     . sodium bicarbonate 650 MG tablet Take 1 tablet (650 mg total) by mouth 2 (two) times daily. 60 tablet 0  . torsemide (DEMADEX) 20 MG tablet Take 1 tablet (20 mg total) by mouth 2 (two) times daily. Take an extra 24m tablet daily prn in the morning for swelling or sob.    . traMADol (ULTRAM) 50 MG tablet One to Two Tabs Every  Six Hours As Needed For Pain 50 tablet 0  . vitamin B-12 (CYANOCOBALAMIN) 1000 MCG tablet Take 1,000 mcg by mouth daily.    . vitamin C (VITAMIN C) 250 MG tablet Take 1 tablet (250 mg total) by mouth 2 (two) times daily.     No current facility-administered medications for this visit.         Physical Exam There were no vitals taken for this visit. Gen:  WD/WN, NAD Skin: Left BKA incision C/D/I     Assessment/Plan: 1. Chronic osteomyelitis of ankle and foot, left ([place]) Patient is status post successful below-knee amputation it is healing well.  We can move forward with a stump shrinker.  I have also contacted [employer] and they will plan for fitting of the prosthesis in approximately 4 weeks.  She will follow-up with me in 6 weeks with no studies.  2. Hx of BKA, left ([place]) See #1      [name] 05/24/2019, 1:13 PM   This note was created with Dragon medical transcription system.  Any errors from dictation are unintentional.

## 2019-05-28 ENCOUNTER — Observation Stay
Admission: EM | Admit: 2019-05-28 | Discharge: 2019-05-29 | Disposition: A | Discharge status: Home / Self Care | Payer: Medicare HMO | Attending: Internal Medicine | Admitting: Internal Medicine

## 2019-05-28 ENCOUNTER — Other Ambulatory Visit: Payer: Self-pay

## 2019-05-28 DIAGNOSIS — I130 Hypertensive heart and chronic kidney disease with heart failure and stage 1 through stage 4 chronic kidney disease, or unspecified chronic kidney disease: Secondary | ICD-10-CM | POA: Insufficient documentation

## 2019-05-28 DIAGNOSIS — Z833 Family history of diabetes mellitus: Secondary | ICD-10-CM | POA: Diagnosis not present

## 2019-05-28 DIAGNOSIS — Z79899 Other long term (current) drug therapy: Secondary | ICD-10-CM | POA: Insufficient documentation

## 2019-05-28 DIAGNOSIS — T8781 Dehiscence of amputation stump: Principal | ICD-10-CM | POA: Insufficient documentation

## 2019-05-28 DIAGNOSIS — N184 Chronic kidney disease, stage 4 (severe): Secondary | ICD-10-CM | POA: Diagnosis not present

## 2019-05-28 DIAGNOSIS — W050XXA Fall from non-moving wheelchair, initial encounter: Secondary | ICD-10-CM | POA: Insufficient documentation

## 2019-05-28 DIAGNOSIS — E1142 Type 2 diabetes mellitus with diabetic polyneuropathy: Secondary | ICD-10-CM | POA: Diagnosis not present

## 2019-05-28 DIAGNOSIS — E785 Hyperlipidemia, unspecified: Secondary | ICD-10-CM | POA: Insufficient documentation

## 2019-05-28 DIAGNOSIS — Z886 Allergy status to analgesic agent status: Secondary | ICD-10-CM | POA: Diagnosis not present

## 2019-05-28 DIAGNOSIS — Z888 Allergy status to other drugs, medicaments and biological substances status: Secondary | ICD-10-CM | POA: Insufficient documentation

## 2019-05-28 DIAGNOSIS — E1122 Type 2 diabetes mellitus with diabetic chronic kidney disease: Secondary | ICD-10-CM | POA: Insufficient documentation

## 2019-05-28 DIAGNOSIS — Z8249 Family history of ischemic heart disease and other diseases of the circulatory system: Secondary | ICD-10-CM | POA: Insufficient documentation

## 2019-05-28 DIAGNOSIS — Z885 Allergy status to narcotic agent status: Secondary | ICD-10-CM | POA: Insufficient documentation

## 2019-05-28 DIAGNOSIS — I5032 Chronic diastolic (congestive) heart failure: Secondary | ICD-10-CM | POA: Diagnosis not present

## 2019-05-28 DIAGNOSIS — Z89512 Acquired absence of left leg below knee: Secondary | ICD-10-CM | POA: Diagnosis not present

## 2019-05-28 DIAGNOSIS — Z794 Long term (current) use of insulin: Secondary | ICD-10-CM | POA: Diagnosis not present

## 2019-05-28 DIAGNOSIS — Z1159 Encounter for screening for other viral diseases: Secondary | ICD-10-CM | POA: Diagnosis not present

## 2019-05-28 DIAGNOSIS — T8131XA Disruption of external operation (surgical) wound, not elsewhere classified, initial encounter: Secondary | ICD-10-CM

## 2019-05-28 DIAGNOSIS — Z880 Allergy status to penicillin: Secondary | ICD-10-CM | POA: Insufficient documentation

## 2019-05-28 DIAGNOSIS — Z7982 Long term (current) use of aspirin: Secondary | ICD-10-CM | POA: Diagnosis not present

## 2019-05-28 DIAGNOSIS — E213 Hyperparathyroidism, unspecified: Secondary | ICD-10-CM | POA: Diagnosis not present

## 2019-05-28 DIAGNOSIS — K219 Gastro-esophageal reflux disease without esophagitis: Secondary | ICD-10-CM | POA: Diagnosis not present

## 2019-05-28 DIAGNOSIS — T8130XA Disruption of wound, unspecified, initial encounter: Secondary | ICD-10-CM | POA: Diagnosis present

## 2019-05-28 NOTE — ED Triage Notes (Signed)
Pt arrived via [facility] from home with a fall. L BKA on May 05, 2019, ran into the bed with her wheelchair and fell out the wheelchair. Pt landed on her incision and it bust open. Vitals WNL.

## 2019-05-28 NOTE — ED Provider Notes (Signed)
[facility] Emergency Department Provider Note  ____________________________________________  Time seen: Approximately 11:52 PM  I have reviewed the triage vital signs and the nursing notes.   HISTORY  Chief Complaint Fall   HPI Rachel Holmes is a 58 y.o. female with history as listed below, POD 24 from a left BKA who presents for evaluation of wound dehiscence.  Patient reports that she was moving from the wheelchair when she fell and hit her stump onto the floor.  She noted dehiscence of the wound.  She is complaining of moderate constant sharp pain.  She denies head trauma or LOC.  She denies any other injuries.  Past Medical History:  Diagnosis Date  . Allergic rhinitis   . Anemia   . Anxiety   . Arthritis   . Back pain   . Bradycardia   . Breast mass    Patient can no longer palpate specific masses but showed tech general area of concern  . CHF (congestive heart failure) ([place])   . CKD (chronic kidney disease)    STAGE 3  . Constipation   . Diabetes mellitus without complication ([place])   . Diabetic nephropathy ([place])   . Dyspnea   . GERD (gastroesophageal reflux disease)   . Heart murmur   . HLD (hyperlipidemia)   . Hyperparathyroidism ([place])   . Hypertension   . Joint pain   . Leg edema   . Legally blind in left eye, as defined in [place]   . Lymphedema   . Obesity   . Onychomycosis     Patient Active Problem List   Diagnosis Date Noted  . Wound dehiscence 05/29/2019  . Hx of BKA, left ([place]) 05/24/2019  . PVD (peripheral vascular disease) ([place]) 05/13/2019  . Chronic osteomyelitis of ankle and foot, left ([place]) 05/05/2019  . Chronic diastolic CHF (congestive heart failure) ([place]) 04/19/2019  . Leg edema 04/19/2019  . Acute bacterial endocarditis 04/19/2019  . Chest pain of uncertain etiology 76/54/6503  . Ulcer of left ankle ([place]) 04/14/2019  . Ankle deformity, left 01/21/2019  . Diabetic polyneuropathy associated with type 2  diabetes mellitus ([place]) 01/21/2019  . Aortic valve endocarditis   . Bacteremia due to Streptococcus 12/24/2018  . Diabetic ulcer of left ankle associated with diabetes mellitus due to underlying condition, with fat layer exposed ([place]) 11/09/2018  . Malignant hypertension (arteriolar nephrosclerosis), stage 1-4 or unspecified chronic kidney disease 07/15/2018  . Stage 4 chronic kidney disease ([place]) 07/15/2018  . Morbid obesity with BMI of 45.0-49.9, adult ([place]) 07/15/2018  . Excessive daytime sleepiness 07/15/2018  . Other fatigue 03/11/2018  . Essential hypertension 03/11/2018  . Vitamin D deficiency 03/11/2018  . Congestive heart failure ([place]) 03/11/2018  . Degenerative arthritis of right knee 01/06/2018  . Uncontrolled type 2 diabetes mellitus with polyneuropathy ([place]) 10/03/2017  . Knee pain 10/02/2017  . Obesity 10/02/2017  . Charcot's joint of left foot 08/29/2017  . Lymphedema 09/06/2016  . Hyponatremia with extracellular fluid depletion 02/02/2016  . Nausea with vomiting, unspecified 02/02/2016  . Closed nondisplaced fracture of right patella 08/23/2015  . Acute cystitis without hematuria 08/14/2014  . Colitis 08/14/2014  . HTN (hypertension), malignant 08/14/2014  . Acute renal failure superimposed on stage 3 chronic kidney disease ([place]) 06/26/2014  . Hyperparathyroidism, unspecified ([place]) 02/24/2014  . Allergic rhinitis 02/22/2014  . Onychomycosis 02/22/2014  . Anxiety 11/05/2013  . Bradycardia 05/14/2013  . Gastro-esophageal reflux disease without esophagitis 04/05/2013  . Anemia in other chronic diseases classified  elsewhere 01/27/2013    Past Surgical History:  Procedure Laterality Date  . ABDOMINAL HYSTERECTOMY    . AMPUTATION Left 05/05/2019   Procedure: AMPUTATION BELOW KNEE;  Surgeon: Katha Cabal, MD;  Location: [facility] ORS;  Service: Vascular;  Laterality: Left;  . BREAST BIOPSY Left 2014   FNA 12:00 position - Negative  . EYE SURGERY Left 2007   removed a  lens, no lens implanted  . IR FLUORO GUIDE CV LINE RIGHT  12/29/2018  . IR REMOVAL TUN CV CATH W/O FL  02/19/2019  . IR US GUIDE VASC ACCESS RIGHT  12/29/2018  . TEE WITHOUT CARDIOVERSION N/A 12/28/2018   Procedure: TRANSESOPHAGEAL ECHOCARDIOGRAM (TEE);  Surgeon: Sueanne Margarita, MD;  Location: [facility] ENDOSCOPY;  Service: Cardiovascular;  Laterality: N/A;  . VAGINAL HYSTERECTOMY     abdominal hyst, not a vaginal hyst    Prior to Admission medications   Medication Sig Start Date End Date Taking? Authorizing Provider  albuterol (PROVENTIL HFA;VENTOLIN HFA) 108 (90 Base) MCG/ACT inhaler Inhale 1-2 puffs into the lungs every 6 (six) hours as needed for wheezing or shortness of breath.   Yes [provider]  amLODipine (NORVASC) 10 MG tablet Take 10 mg by mouth daily.   Yes [provider]  aspirin EC 81 MG EC tablet Take 1 tablet (81 mg total) by mouth daily. 05/11/19  Yes Stegmayer, Janalyn Harder, PA-C  Blood Glucose Monitoring Suppl (ONE TOUCH ULTRA 2) w/Device KIT 1 Device by Does not apply route daily. 03/26/18  Yes Renato Shin, MD  clobetasol cream (TEMOVATE) 6.37 % Apply 1 application topically 2 (two) times daily.   Yes [provider]  cloNIDine (CATAPRES) 0.3 MG tablet Take 0.3 mg by mouth 2 (two) times daily.   Yes [provider]  docusate sodium (COLACE) 100 MG capsule Take 1 capsule (100 mg total) by mouth 2 (two) times daily. 05/10/19  Yes Stegmayer, Janalyn Harder, PA-C  Dulaglutide (TRULICITY) 1.5 CH/8.8FO SOPN Inject 1.5 mg into the skin once a week. Patient taking differently: Inject 1.5 mg into the skin once a week.  08/21/18  Yes Renato Shin, MD  DULoxetine (CYMBALTA) 30 MG capsule Take 30 mg by mouth daily.  08/28/18  Yes [provider]  Ensure Max Protein (ENSURE MAX PROTEIN) LIQD Take 330 mLs (11 oz total) by mouth 2 (two) times daily. 05/10/19  Yes Stegmayer, Joelene Millin A, PA-C  fluticasone (FLONASE) 50 MCG/ACT nasal spray Place 1 spray into both  nostrils daily.    Yes [provider]  gabapentin (NEURONTIN) 600 MG tablet Take 600 mg by mouth at bedtime.   Yes [provider]  glucosamine-chondroitin 500-400 MG tablet Take 1 tablet by mouth 2 (two) times daily.   Yes [provider]  glucose blood (ONETOUCH VERIO) test strip 1 each by Other route 2 (two) times a day. Use to monitor glucose levels BID; E11.42 03/22/19  Yes Renato Shin, MD  insulin aspart (NOVOLOG) 100 UNIT/ML injection Inject 3 Units into the skin 3 (three) times daily with meals. 05/10/19  Yes Stegmayer, Joelene Millin A, PA-C  insulin detemir (LEVEMIR) 100 UNIT/ML injection Inject 0.09 mLs (9 Units total) into the skin at bedtime. 05/10/19  Yes Stegmayer, Janalyn Harder, PA-C  isosorbide mononitrate (IMDUR) 30 MG 24 hr tablet Take 1 tablet (30 mg total) by mouth 2 (two) times daily. 04/20/19  Yes Minna Merritts, MD  isosorbide mononitrate (IMDUR) 60 MG 24 hr tablet Take 1 tablet (60 mg total) by mouth 2 (  two) times a day. 04/19/19  Yes Gollan, Kathlene November, MD  Lancets [facility] ULTRASOFT) lancets Used to check blood sugars four times daily. 03/26/18  Yes Renato Shin, MD  Multiple Vitamin (MULTIVITAMIN WITH MINERALS) TABS tablet Take 1 tablet by mouth daily.   Yes [provider]  omeprazole (PRILOSEC) 40 MG capsule Take 40 mg by mouth 2 (two) times daily.   Yes [provider]  sodium bicarbonate 650 MG tablet Take 1 tablet (650 mg total) by mouth 2 (two) times daily. 12/31/18  Yes Ghimire, Henreitta Leber, MD  torsemide (DEMADEX) 20 MG tablet Take 1 tablet (20 mg total) by mouth 2 (two) times daily. Take an extra 72m tablet daily prn in the morning for swelling or sob. 04/19/19  Yes Gollan, TKathlene November MD  vitamin B-12 (CYANOCOBALAMIN) 1000 MCG tablet Take 1,000 mcg by mouth daily.   Yes [provider]  vitamin C (VITAMIN C) 250 MG tablet Take 1 tablet (250 mg total) by mouth 2 (two) times daily. 05/10/19  Yes Stegmayer, KJoelene MillinA, PA-C   Insulin Pen Needle (BD PEN NEEDLE MICRO U/F) 32G X 6 MM MISC 1 each by Does not apply route 3 (three) times daily. Patient not taking: Reported on 05/29/2019 03/15/19   ERenato Shin MD  ondansetron (ZOFRAN) 4 MG tablet Take 4 mg by mouth every 8 (eight) hours as needed for nausea or vomiting.    [provider]  traMADol (Veatrice [place] 50 MG tablet One to Two Tabs Every Six Hours As Needed For Pain 05/10/19   Stegmayer, KJoelene MillinA, PA-C    Allergies Statins, Penicillins, Codeine, Ibuprofen, Percocet [oxycodone-acetaminophen], Tramadol, and Vicodin [hydrocodone-acetaminophen]  Family History  Problem Relation Age of Onset  . Breast cancer Sister 545 . Diabetes Sister   . Diabetes Mother   . Hypertension Mother   . Hyperlipidemia Mother   . Eating disorder Mother   . Obesity Mother     Social History Social History   Tobacco Use  . Smoking status: Never Smoker  . Smokeless tobacco: Never Used  Substance Use Topics  . Alcohol use: No  . Drug use: Never    Review of Systems  Constitutional: Negative for fever. Eyes: Negative for visual changes. ENT: Negative for sore throat. Neck: No neck pain  Cardiovascular: Negative for chest pain. Respiratory: Negative for shortness of breath. Gastrointestinal: Negative for abdominal pain, vomiting or diarrhea. Genitourinary: Negative for dysuria. Musculoskeletal: Negative for back pain. Skin: Negative for rash. + wound dehiscense Neurological: Negative for headaches, weakness or numbness. Psych: No SI or HI  ____________________________________________   PHYSICAL EXAM:  VITAL SIGNS: ED Triage Vitals  Enc Vitals Group     BP 05/28/19 2252 (!) 194/70     Pulse Rate 05/28/19 2252 74     Resp 05/28/19 2252 16     Temp 05/28/19 2252 98.9 F (37.2 C)     Temp Source 05/28/19 2252 Oral     SpO2 05/28/19 2252 100 %     Weight 05/28/19 2250 290 lb (131.5 kg)     Height 05/28/19 2250 5' 8"  (1.727 m)     Head Circumference --       Peak Flow --      Pain Score 05/28/19 2250 0     Pain Loc --      Pain Edu? --      Excl. in [place] --     Constitutional: Alert and oriented. Well appearing and in no apparent distress. HEENT:  Head: Normocephalic and atraumatic.         Eyes: Conjunctivae are normal. Sclera is non-icteric.       Mouth/Throat: Mucous membranes are moist.       Neck: Supple with no signs of meningismus.  No C-spine tenderness Cardiovascular: Regular rate and rhythm. No murmurs, gallops, or rubs. 2+ symmetrical distal pulses are present in all extremities. No JVD. Respiratory: Normal respiratory effort. Lungs are clear to auscultation bilaterally. No wheezes, crackles, or rhonchi.  Gastrointestinal: Soft, non tender, and non distended with positive bowel sounds. No rebound or guarding. Musculoskeletal: Left BKA with a dehisced wound as seen on picture below.  Full painless range of motion of all other extremities, no T and L-spine tenderness Neurologic: Normal speech and language. Face is symmetric. Moving all extremities. No gross focal neurologic deficits are appreciated. Skin: Skin is warm, dry and intact. No rash noted. Psychiatric: Mood and affect are normal. Speech and behavior are normal.      ____________________________________________   LABS (all labs ordered are listed, but only abnormal results are displayed)  Labs Reviewed  COMPREHENSIVE METABOLIC PANEL - Abnormal; Notable for the following components:      Result Value   CO2 20 (*)    BUN 66 (*)    Creatinine, Ser 3.25 (*)    Calcium 8.8 (*)    Total Protein 8.2 (*)    Alkaline Phosphatase 144 (*)    GFR calc non Af Amer 15 (*)    GFR calc Af Amer 17 (*)    All other components within normal limits  CBC - Abnormal; Notable for the following components:   RBC 3.21 (*)    Hemoglobin 9.4 (*)    HCT 29.2 (*)    All other components within normal limits  PROTIME-INR - Abnormal; Notable for the following components:    Prothrombin Time 11.3 (*)    All other components within normal limits  SARS CORONAVIRUS 2 (HOSPITAL ORDER, [facility])  MRSA PCR SCREENING  TSH   ____________________________________________  EKG  none  ____________________________________________  RADIOLOGY  none  ____________________________________________   PROCEDURES  Procedure(s) performed: None Procedures Critical Care performed:  None ____________________________________________   INITIAL IMPRESSION / ASSESSMENT AND PLAN / ED COURSE  58 y.o. female with history as listed below, POD 24 from a left BKA who presents for evaluation of wound dehiscence after trauma before arrival. No longer bleeding. See pic above. Discussed with Dr. Lorenso Courier on call for vascular surgery who recommended admission to the hospitalist for wound revision.       As part of my medical decision making, I reviewed the following data within the [place] notes reviewed and incorporated, Old chart reviewed, Discussed with admitting physician , A consult was requested and obtained from this/these consultant(s) Vascular surgery, Notes from prior ED visits and [place] Controlled Substance Database   Patient was evaluated in Emergency Department today for the symptoms described in the history of present illness. Patient was evaluated in the context of the global COVID-19 pandemic, which necessitated consideration that the patient might be at risk for infection with the SARS-CoV-2 virus that causes COVID-19. Institutional protocols and algorithms that pertain to the evaluation of patients at risk for COVID-19 are in a state of rapid change based on information released by regulatory bodies including the CDC and federal and state organizations. These policies and algorithms were followed during the patient's care in the ED.   ____________________________________________  FINAL CLINICAL IMPRESSION(S) / ED  DIAGNOSES   Final diagnoses:  Disruption of external surgical wound, initial encounter      NEW MEDICATIONS STARTED DURING THIS VISIT:  ED Discharge Orders    None       Note:  This document was prepared using Dragon voice recognition software and may include unintentional dictation errors.    Alfred Levins, [place], MD 05/29/19 [phone]

## 2019-05-29 DIAGNOSIS — T8130XA Disruption of wound, unspecified, initial encounter: Secondary | ICD-10-CM | POA: Diagnosis present

## 2019-05-29 LAB — MRSA PCR SCREENING: MRSA by PCR: NEGATIVE

## 2019-05-29 LAB — CBC
HCT: 29.2 % — ABNORMAL LOW (ref 36.0–46.0)
Hemoglobin: 9.4 g/dL — ABNORMAL LOW (ref 12.0–15.0)
MCH: 29.3 pg (ref 26.0–34.0)
MCHC: 32.2 g/dL (ref 30.0–36.0)
MCV: 91 fL (ref 80.0–100.0)
Platelets: 243 10*3/uL (ref 150–400)
RBC: 3.21 MIL/uL — ABNORMAL LOW (ref 3.87–5.11)
RDW: 12.7 % (ref 11.5–15.5)
WBC: 9.1 10*3/uL (ref 4.0–10.5)
nRBC: 0 % (ref 0.0–0.2)

## 2019-05-29 LAB — COMPREHENSIVE METABOLIC PANEL
ALT: 23 U/L (ref 0–44)
AST: 27 U/L (ref 15–41)
Albumin: 4 g/dL (ref 3.5–5.0)
Alkaline Phosphatase: 144 U/L — ABNORMAL HIGH (ref 38–126)
Anion gap: 13 (ref 5–15)
BUN: 66 mg/dL — ABNORMAL HIGH (ref 6–20)
CO2: 20 mmol/L — ABNORMAL LOW (ref 22–32)
Calcium: 8.8 mg/dL — ABNORMAL LOW (ref 8.9–10.3)
Chloride: 103 mmol/L (ref 98–111)
Creatinine, Ser: 3.25 mg/dL — ABNORMAL HIGH (ref 0.44–1.00)
GFR calc Af Amer: 17 mL/min — ABNORMAL LOW (ref >60)
GFR calc non Af Amer: 15 mL/min — ABNORMAL LOW (ref >60)
Glucose, Bld: 90 mg/dL (ref 70–99)
Potassium: 4.3 mmol/L (ref 3.5–5.1)
Sodium: 136 mmol/L (ref 135–145)
Total Bilirubin: 0.4 mg/dL (ref 0.3–1.2)
Total Protein: 8.2 g/dL — ABNORMAL HIGH (ref 6.5–8.1)

## 2019-05-29 LAB — SARS CORONAVIRUS 2 BY RT PCR (HOSPITAL ORDER, PERFORMED IN ~~LOC~~ HOSPITAL LAB): SARS Coronavirus 2: NEGATIVE

## 2019-05-29 LAB — GLUCOSE, CAPILLARY
Glucose-Capillary: 202 mg/dL — ABNORMAL HIGH (ref 70–99)
Glucose-Capillary: 260 mg/dL — ABNORMAL HIGH (ref 70–99)

## 2019-05-29 LAB — TSH: TSH: 1.701 u[IU]/mL (ref 0.350–4.500)

## 2019-05-29 LAB — PROTIME-INR
INR: 0.8 (ref 0.8–1.2)
Prothrombin Time: 11.3 seconds — ABNORMAL LOW (ref 11.4–15.2)

## 2019-05-29 MED ORDER — CLONIDINE HCL 0.1 MG PO TABS
0.3000 mg | ORAL_TABLET | Freq: Two times a day (BID) | ORAL | Status: DC
  Administered 2019-05-29: 0.3 mg via ORAL
  Filled 2019-05-29: qty 3

## 2019-05-29 MED ORDER — TRAMADOL HCL 50 MG PO TABS
100.0000 mg | ORAL_TABLET | Freq: Once | ORAL | Status: AC
  Administered 2019-05-29: 100 mg via ORAL
  Filled 2019-05-29: qty 2

## 2019-05-29 MED ORDER — ONDANSETRON HCL 4 MG PO TABS: 4.0000 mg | ORAL_TABLET | Freq: Four times a day (QID) | ORAL | Status: DC | PRN

## 2019-05-29 MED ORDER — ACETAMINOPHEN 650 MG RE SUPP: 650.0000 mg | Freq: Four times a day (QID) | RECTAL | Status: DC | PRN

## 2019-05-29 MED ORDER — ONDANSETRON HCL 4 MG/2ML IJ SOLN: 4.0000 mg | Freq: Four times a day (QID) | INTRAMUSCULAR | Status: DC | PRN

## 2019-05-29 MED ORDER — MORPHINE SULFATE (PF) 2 MG/ML IV SOLN: 2.0000 mg | INTRAVENOUS | Status: DC | PRN

## 2019-05-29 MED ORDER — LABETALOL HCL 5 MG/ML IV SOLN
5.0000 mg | INTRAVENOUS | Status: DC | PRN
  Administered 2019-05-29: 10 mg via INTRAVENOUS
  Filled 2019-05-29: qty 4

## 2019-05-29 MED ORDER — PANTOPRAZOLE SODIUM 40 MG PO TBEC
80.0000 mg | DELAYED_RELEASE_TABLET | Freq: Every day | ORAL | Status: DC
  Administered 2019-05-29: 80 mg via ORAL
  Filled 2019-05-29: qty 2

## 2019-05-29 MED ORDER — ACETAMINOPHEN 325 MG PO TABS: 650.0000 mg | ORAL_TABLET | Freq: Four times a day (QID) | ORAL | Status: DC | PRN

## 2019-05-29 MED ORDER — INSULIN ASPART 100 UNIT/ML ~~LOC~~ SOLN
0.0000 [IU] | Freq: Three times a day (TID) | SUBCUTANEOUS | Status: DC
  Administered 2019-05-29: 8 [IU] via SUBCUTANEOUS
  Administered 2019-05-29: 5 [IU] via SUBCUTANEOUS
  Filled 2019-05-29 (×2): qty 1

## 2019-05-29 MED ORDER — INSULIN DETEMIR 100 UNIT/ML ~~LOC~~ SOLN
6.0000 [IU] | Freq: Every day | SUBCUTANEOUS | Status: DC
  Filled 2019-05-29: qty 0.06

## 2019-05-29 MED ORDER — TRAMADOL HCL 50 MG PO TABS
50.0000 mg | ORAL_TABLET | Freq: Four times a day (QID) | ORAL | Status: DC | PRN
  Administered 2019-05-29: 50 mg via ORAL
  Filled 2019-05-29: qty 1

## 2019-05-29 MED ORDER — VITAMIN B-12 1000 MCG PO TABS
1000.0000 ug | ORAL_TABLET | Freq: Every day | ORAL | Status: DC
  Administered 2019-05-29: 1000 ug via ORAL
  Filled 2019-05-29: qty 1

## 2019-05-29 MED ORDER — TORSEMIDE 20 MG PO TABS
20.0000 mg | ORAL_TABLET | Freq: Two times a day (BID) | ORAL | Status: DC
  Administered 2019-05-29: 20 mg via ORAL
  Filled 2019-05-29: qty 1

## 2019-05-29 MED ORDER — HYDRALAZINE HCL 20 MG/ML IJ SOLN: 10.0000 mg | Freq: Four times a day (QID) | INTRAMUSCULAR | Status: DC | PRN

## 2019-05-29 MED ORDER — DOCUSATE SODIUM 100 MG PO CAPS
100.0000 mg | ORAL_CAPSULE | Freq: Two times a day (BID) | ORAL | Status: DC
  Administered 2019-05-29: 100 mg via ORAL
  Filled 2019-05-29: qty 1

## 2019-05-29 MED ORDER — AMLODIPINE BESYLATE 10 MG PO TABS
10.0000 mg | ORAL_TABLET | Freq: Every day | ORAL | Status: DC
  Administered 2019-05-29: 10 mg via ORAL
  Filled 2019-05-29: qty 1

## 2019-05-29 NOTE — Progress Notes (Signed)
Dr Lorenso Courier at bedside, Orders received to advance diet to carb. Order placed.

## 2019-05-29 NOTE — Discharge Instructions (Signed)
wound care on Monday. Recommend wet- dry dressings daily or wound VAC.

## 2019-05-29 NOTE — Discharge Summary (Signed)
[facility] NAME: Rachel Holmes    MR#:  [id number]  DATE OF BIRTH:  Mar 29, 1961  DATE OF ADMISSION:  05/28/2019   ADMITTING PHYSICIAN: Harrie Foreman, MD  DATE OF DISCHARGE: 05/29/2019 PRIMARY CARE PHYSICIAN: Sharyne Peach, MD   ADMISSION DIAGNOSIS:  Disruption of external surgical wound, initial encounter [T81.31XA] DISCHARGE DIAGNOSIS:  Active Problems:   Wound dehiscence  SECONDARY DIAGNOSIS:   Past Medical History:  Diagnosis Date  . Allergic rhinitis   . Anemia   . Anxiety   . Arthritis   . Back pain   . Bradycardia   . Breast mass    Patient can no longer palpate specific masses but showed tech general area of concern  . CHF (congestive heart failure) ([place])   . CKD (chronic kidney disease)    STAGE 3  . Constipation   . Diabetes mellitus without complication ([place])   . Diabetic nephropathy ([place])   . Dyspnea   . GERD (gastroesophageal reflux disease)   . Heart murmur   . HLD (hyperlipidemia)   . Hyperparathyroidism ([place])   . Hypertension   . Joint pain   . Leg edema   . Legally blind in left eye, as defined in [place]   . Lymphedema   . Obesity   . Onychomycosis    HOSPITAL COURSE:  This is a 58 year old female admitted for wound dehiscence. 1.  Wound dehiscence:  D/C home with wound care on Monday. Recommend wet- dry dressings daily or wound VAC per Dr. Lorenso Courier. 2.  Hypertension, accelearted: Resumed amlodipine and clonidine per home regimen.  Labetalol as needed. Better controlled. 3.  CKD: Stage IV; avoid nephrotoxic agents.  Hydrated with intravenous fluid.  Continue torsemide per home regimen 4.  Diabetes mellitus type 2: Continue basal insulin therapy.  Sliding scale insulin while hospitalized.  Gabapentin for neuropathy Discussed with Dr. Lorenso Courier. DISCHARGE CONDITIONS:  Stable, discharge to home today. CONSULTS OBTAINED:   DRUG ALLERGIES:   Allergies  Allergen Reactions  . Statins Shortness  Of Breath and Other (See Comments)    Wheezing, short of breath  . Penicillins Hives and Other (See Comments)    Has patient had a PCN reaction causing immediate rash, facial/tongue/throat swelling, SOB or lightheadedness with hypotension: No Has patient had a PCN reaction causing severe rash involving mucus membranes or skin necrosis: No Has patient had a PCN reaction that required hospitalization No Has patient had a PCN reaction occurring within the last 10 years: No If all of the above answers are "NO", then may proceed with Cephalosporin use.  . Codeine Nausea Only  . Ibuprofen Other (See Comments)    Reaction:  Raises pts BP  . Percocet [Oxycodone-Acetaminophen] Nausea Only  . Tramadol Nausea Only and Other (See Comments)    Can take if she has eaten. Currently takes with good results   . Vicodin [Hydrocodone-Acetaminophen] Itching and Nausea Only   DISCHARGE MEDICATIONS:   Allergies as of 05/29/2019      Reactions   Statins Shortness Of Breath, Other (See Comments)   Wheezing, short of breath   Penicillins Hives, Other (See Comments)   Has patient had a PCN reaction causing immediate rash, facial/tongue/throat swelling, SOB or lightheadedness with hypotension: No Has patient had a PCN reaction causing severe rash involving mucus membranes or skin necrosis: No Has patient had a PCN reaction that required hospitalization No Has patient had a PCN reaction occurring within the last  10 years: No If all of the above answers are "NO", then may proceed with Cephalosporin use.   Codeine Nausea Only   Ibuprofen Other (See Comments)   Reaction:  Raises pts BP   Percocet [oxycodone-acetaminophen] Nausea Only   Tramadol Nausea Only, Other (See Comments)   Can take if she has eaten. Currently takes with good results   Vicodin [hydrocodone-acetaminophen] Itching, Nausea Only      Medication List    TAKE these medications   albuterol 108 (90 Base) MCG/ACT inhaler Commonly known as:  VENTOLIN HFA Inhale 1-2 puffs into the lungs every 6 (six) hours as needed for wheezing or shortness of breath.   amLODipine 10 MG tablet Commonly known as: NORVASC Take 10 mg by mouth daily.   ascorbic acid 250 MG tablet Commonly known as: VITAMIN C Take 1 tablet (250 mg total) by mouth 2 (two) times daily.   aspirin 81 MG EC tablet Take 1 tablet (81 mg total) by mouth daily.   clobetasol cream 0.05 % Commonly known as: TEMOVATE Apply 1 application topically 2 (two) times daily.   cloNIDine 0.3 MG tablet Commonly known as: CATAPRES Take 0.3 mg by mouth 2 (two) times daily.   Demadex 20 MG tablet Generic drug: torsemide Take 1 tablet (20 mg total) by mouth 2 (two) times daily. Take an extra 48m tablet daily prn in the morning for swelling or sob.   docusate sodium 100 MG capsule Commonly known as: COLACE Take 1 capsule (100 mg total) by mouth 2 (two) times daily.   Dulaglutide 1.5 MG/0.5ML Sopn Commonly known as: Trulicity Inject 1.5 mg into the skin once a week.   DULoxetine 30 MG capsule Commonly known as: CYMBALTA Take 30 mg by mouth daily.   Ensure Max Protein Liqd Take 330 mLs (11 oz total) by mouth 2 (two) times daily.   fluticasone 50 MCG/ACT nasal spray Commonly known as: FLONASE Place 1 spray into both nostrils daily.   gabapentin 600 MG tablet Commonly known as: NEURONTIN Take 600 mg by mouth at bedtime.   glucosamine-chondroitin 500-400 MG tablet Take 1 tablet by mouth 2 (two) times daily.   glucose blood test strip Commonly known as: OneTouch Verio 1 each by Other route 2 (two) times a day. Use to monitor glucose levels BID; E11.42   insulin aspart 100 UNIT/ML injection Commonly known as: novoLOG Inject 3 Units into the skin 3 (three) times daily with meals.   insulin detemir 100 UNIT/ML injection Commonly known as: LEVEMIR Inject 0.09 mLs (9 Units total) into the skin at bedtime.   Insulin Pen Needle 32G X 6 MM Misc Commonly known as: BD  Pen Needle Micro U/F 1 each by Does not apply route 3 (three) times daily.   isosorbide mononitrate 30 MG 24 hr tablet Commonly known as: IMDUR Take 1 tablet (30 mg total) by mouth 2 (two) times daily. What changed: Another medication with the same name was removed. Continue taking this medication, and follow the directions you see here.   multivitamin with minerals Tabs tablet Take 1 tablet by mouth daily.   omeprazole 40 MG capsule Commonly known as: PRILOSEC Take 40 mg by mouth 2 (two) times daily.   ondansetron 4 MG tablet Commonly known as: ZOFRAN Take 4 mg by mouth every 8 (eight) hours as needed for nausea or vomiting.   ONE TOUCH ULTRA 2 w/Device Kit 1 Device by Does not apply route daily.   onetouch ultrasoft lancets Used to check blood sugars  four times daily.   sodium bicarbonate 650 MG tablet Take 1 tablet (650 mg total) by mouth 2 (two) times daily.   traMADol 50 MG tablet Commonly known as: ULTRAM One to Two Tabs Every Six Hours As Needed For Pain   vitamin B-12 1000 MCG tablet Commonly known as: CYANOCOBALAMIN Take 1,000 mcg by mouth daily.        DISCHARGE INSTRUCTIONS:  See AVS.  If you experience worsening of your admission symptoms, develop shortness of breath, life threatening emergency, suicidal or homicidal thoughts you must seek medical attention immediately by calling 911 or calling your MD immediately  if symptoms less severe.  You Must read complete instructions/literature along with all the possible adverse reactions/side effects for all the Medicines you take and that have been prescribed to you. Take any new Medicines after you have completely understood and accpet all the possible adverse reactions/side effects.   Please note  You were cared for by a hospitalist during your hospital stay. If you have any questions about your discharge medications or the care you received while you were in the hospital after you are discharged, you can  call the unit and asked to speak with the hospitalist on call if the hospitalist that took care of you is not available. Once you are discharged, your primary care physician will handle any further medical issues. Please note that NO REFILLS for any discharge medications will be authorized once you are discharged, as it is imperative that you return to your primary care physician (or establish a relationship with a primary care physician if you do not have one) for your aftercare needs so that they can reassess your need for medications and monitor your lab values.    On the day of Discharge:  VITAL SIGNS:  Blood pressure (!) 159/55, pulse 79, temperature 98.3 F (36.8 C), resp. rate 18, height _0  (1.727 m), weight 131.5 kg, SpO2 100 %. PHYSICAL EXAMINATION:  GENERAL:  58 y.o.-year-old patient lying in the bed with no acute distress.  EYES: Pupils equal, round, reactive to light and accommodation. No scleral icterus. Extraocular muscles intact.  HEENT: Head atraumatic, normocephalic. Oropharynx and nasopharynx clear.  NECK:  Supple, no jugular venous distention. No thyroid enlargement, no tenderness.  LUNGS: Normal breath sounds bilaterally, no wheezing, rales,rhonchi or crepitation. No use of accessory muscles of respiration.  CARDIOVASCULAR: S1, S2 normal. No murmurs, rubs, or gallops.  ABDOMEN: Soft, non-tender, non-distended. Bowel sounds present. No organomegaly or mass.  EXTREMITIES: No pedal edema, cyanosis, or clubbing.  NEUROLOGIC: Cranial nerves II through XII are intact. Muscle strength 5/5 in all extremities. Sensation intact. Gait not checked.  PSYCHIATRIC: The patient is alert and oriented x 3.  SKIN: No obvious rash, lesion, or ulcer.  DATA REVIEW:   CBC Recent Labs  Lab 05/28/19 2252  WBC 9.1  HGB 9.4*  HCT 29.2*  PLT 243    Chemistries  Recent Labs  Lab 05/28/19 2252  NA 136  K 4.3  CL 103  CO2 20*  GLUCOSE 90  BUN 66*  CREATININE 3.25*  CALCIUM 8.8*   AST 27  ALT 23  ALKPHOS 144*  BILITOT 0.4     Microbiology Results  Results for orders placed or performed during the hospital encounter of 05/28/19  SARS Coronavirus 2 (CEPHEID - Performed in [facility] hospital lab), Hosp Order     Status: None   Collection Time: 05/29/19 12:15 AM   Specimen: Nasopharyngeal Swab  Result Value Ref  Range Status   SARS Coronavirus 2 NEGATIVE NEGATIVE Final    Comment: (NOTE) If result is NEGATIVE SARS-CoV-2 target nucleic acids are NOT DETECTED. The SARS-CoV-2 RNA is generally detectable in upper and lower  respiratory specimens during the acute phase of infection. The lowest  concentration of SARS-CoV-2 viral copies this assay can detect is 250  copies / mL. A negative result does not preclude SARS-CoV-2 infection  and should not be used as the sole basis for treatment or other  patient management decisions.  A negative result may occur with  improper specimen collection / handling, submission of specimen other  than nasopharyngeal swab, presence of viral mutation(s) within the  areas targeted by this assay, and inadequate number of viral copies  (<250 copies / mL). A negative result must be combined with clinical  observations, patient history, and epidemiological information. If result is POSITIVE SARS-CoV-2 target nucleic acids are DETECTED. The SARS-CoV-2 RNA is generally detectable in upper and lower  respiratory specimens dur ing the acute phase of infection.  Positive  results are indicative of active infection with SARS-CoV-2.  Clinical  correlation with patient history and other diagnostic information is  necessary to determine patient infection status.  Positive results do  not rule out bacterial infection or co-infection with other viruses. If result is PRESUMPTIVE POSTIVE SARS-CoV-2 nucleic acids MAY BE PRESENT.   A presumptive positive result was obtained on the submitted specimen  and confirmed on repeat testing.  While 2019  novel coronavirus  (SARS-CoV-2) nucleic acids may be present in the submitted sample  additional confirmatory testing may be necessary for epidemiological  and / or clinical management purposes  to differentiate between  SARS-CoV-2 and other Sarbecovirus currently known to infect humans.  If clinically indicated additional testing with an alternate test  methodology [phone]) is advised. The SARS-CoV-2 RNA is generally  detectable in upper and lower respiratory sp ecimens during the acute  phase of infection. The expected result is Negative. Fact Sheet for Patients:  StrictlyIdeas.no Fact Sheet for Healthcare Providers: BankingDealers.co.za This test is not yet approved or cleared by the [place] FDA and has been authorized for detection and/or diagnosis of SARS-CoV-2 by FDA under an Emergency Use Authorization (EUA).  This EUA will remain in effect (meaning this test can be used) for the duration of the COVID-19 declaration under Section 564(b)(1) of the Act, 21 U.S.C. section 360bbb-3(b)(1), unless the authorization is terminated or revoked sooner. Performed at [facility], [address] [place], [place] [address]   MRSA PCR Screening     Status: None   Collection Time: 05/29/19  4:44 AM   Specimen: Nasal Mucosa; Nasopharyngeal  Result Value Ref Range Status   MRSA by PCR NEGATIVE NEGATIVE Final    Comment:        The GeneXpert MRSA Assay (FDA approved for NASAL specimens only), is one component of a comprehensive MRSA colonization surveillance program. It is not intended to diagnose MRSA infection nor to guide or monitor treatment for MRSA infections. Performed at [facility], [address], [place], [place] [address]     RADIOLOGY:  No results found.   Management plans discussed with the patient, family and they are in agreement.  CODE STATUS: Full Code   TOTAL TIME TAKING CARE OF THIS  PATIENT: 28 minutes.    Demetrios Loll M.D on 05/29/2019 at 11:17 AM  Between 7am to 6pm - Pager - [phone]  After 6pm go to [email or web address] - password  [email or web address]  [facility]  Office  [phone]  CC: Primary care physician; Sharyne Peach, MD   Note: This dictation was prepared with Dragon dictation along with smaller phrase technology. Any transcriptional errors that result from this process are unintentional.

## 2019-05-29 NOTE — H&P (Signed)
Rachel Holmes is an 58 y.o. female.   Chief Complaint: Fall HPI: The patient with past medical history of diabetes, hypertension, CHF and chronic kidney disease presents to the emergency department after suffering a fall.  The patient was recently discharged from the hospital after undergoing left BKA.  While at home her wheelchair Snagged on the corner of her bed frame which caused her to fall as she was transferring.  She immediately felt the wound closure of her left leg open.  The patient reports seeing a lot of blood but denies losing consciousness.  Once stabilized in the emergency department the hospital service was called for medical management until the surgery service could repair the surgical incision.  Past Medical History:  Diagnosis Date  . Allergic rhinitis   . Anemia   . Anxiety   . Arthritis   . Back pain   . Bradycardia   . Breast mass    Patient can no longer palpate specific masses but showed tech general area of concern  . CHF (congestive heart failure) ([place])   . CKD (chronic kidney disease)    STAGE 3  . Constipation   . Diabetes mellitus without complication ([place])   . Diabetic nephropathy ([place])   . Dyspnea   . GERD (gastroesophageal reflux disease)   . Heart murmur   . HLD (hyperlipidemia)   . Hyperparathyroidism ([place])   . Hypertension   . Joint pain   . Leg edema   . Legally blind in left eye, as defined in [place]   . Lymphedema   . Obesity   . Onychomycosis     Past Surgical History:  Procedure Laterality Date  . ABDOMINAL HYSTERECTOMY    . AMPUTATION Left 05/05/2019   Procedure: AMPUTATION BELOW KNEE;  Surgeon: Katha Cabal, MD;  Location: [facility] ORS;  Service: Vascular;  Laterality: Left;  . BREAST BIOPSY Left 2014   FNA 12:00 position - Negative  . EYE SURGERY Left 2007   removed a lens, no lens implanted  . IR FLUORO GUIDE CV LINE RIGHT  12/29/2018  . IR REMOVAL TUN CV CATH W/O FL  02/19/2019  . IR US GUIDE VASC ACCESS RIGHT  12/29/2018  . TEE  WITHOUT CARDIOVERSION N/A 12/28/2018   Procedure: TRANSESOPHAGEAL ECHOCARDIOGRAM (TEE);  Surgeon: Sueanne Margarita, MD;  Location: [facility] ENDOSCOPY;  Service: Cardiovascular;  Laterality: N/A;  . VAGINAL HYSTERECTOMY     abdominal hyst, not a vaginal hyst    Family History  Problem Relation Age of Onset  . Breast cancer Sister 22  . Diabetes Sister   . Diabetes Mother   . Hypertension Mother   . Hyperlipidemia Mother   . Eating disorder Mother   . Obesity Mother    Social History:  reports that she has never smoked. She has never used smokeless tobacco. She reports that she does not drink alcohol or use drugs.  Allergies:  Allergies  Allergen Reactions  . Statins Shortness Of Breath and Other (See Comments)    Wheezing, short of breath  . Penicillins Hives and Other (See Comments)    Has patient had a PCN reaction causing immediate rash, facial/tongue/throat swelling, SOB or lightheadedness with hypotension: No Has patient had a PCN reaction causing severe rash involving mucus membranes or skin necrosis: No Has patient had a PCN reaction that required hospitalization No Has patient had a PCN reaction occurring within the last 10 years: No If all of the above answers are "NO", then may proceed  with Cephalosporin use.  . Codeine Nausea Only  . Ibuprofen Other (See Comments)    Reaction:  Raises pts BP  . Percocet [Oxycodone-Acetaminophen] Nausea Only  . Tramadol Nausea Only and Other (See Comments)    Can take if she has eaten. Currently takes with good results   . Vicodin [Hydrocodone-Acetaminophen] Itching and Nausea Only    Medications Prior to Admission  Medication Sig Dispense Refill  . albuterol (PROVENTIL HFA;VENTOLIN HFA) 108 (90 Base) MCG/ACT inhaler Inhale 1-2 puffs into the lungs every 6 (six) hours as needed for wheezing or shortness of breath.    [name] amLODipine (NORVASC) 10 MG tablet Take 10 mg by mouth daily.    [name] aspirin EC 81 MG EC tablet Take 1 tablet (81 mg total)  by mouth daily.    . Blood Glucose Monitoring Suppl (ONE TOUCH ULTRA 2) w/Device KIT 1 Device by Does not apply route daily. 1 each 0  . clobetasol cream (TEMOVATE) 3.49 % Apply 1 application topically 2 (two) times daily.    . cloNIDine (CATAPRES) 0.3 MG tablet Take 0.3 mg by mouth 2 (two) times daily.    [name] docusate sodium (COLACE) 100 MG capsule Take 1 capsule (100 mg total) by mouth 2 (two) times daily. 10 capsule 0  . Dulaglutide (TRULICITY) 1.5 ZP/9.1TA SOPN Inject 1.5 mg into the skin once a week. (Patient taking differently: Inject 1.5 mg into the skin once a week. ) 4 pen 11  . DULoxetine (CYMBALTA) 30 MG capsule Take 30 mg by mouth daily.     . Ensure Max Protein (ENSURE MAX PROTEIN) LIQD Take 330 mLs (11 oz total) by mouth 2 (two) times daily.    . fluticasone (FLONASE) 50 MCG/ACT nasal spray Place 1 spray into both nostrils daily.     [name] gabapentin (NEURONTIN) 600 MG tablet Take 600 mg by mouth at bedtime.    [name] glucosamine-chondroitin 500-400 MG tablet Take 1 tablet by mouth 2 (two) times daily.    [name] glucose blood (ONETOUCH VERIO) test strip 1 each by Other route 2 (two) times a day. Use to monitor glucose levels BID; E11.42 100 each 3  . insulin aspart (NOVOLOG) 100 UNIT/ML injection Inject 3 Units into the skin 3 (three) times daily with meals. 10 mL 11  . insulin detemir (LEVEMIR) 100 UNIT/ML injection Inject 0.09 mLs (9 Units total) into the skin at bedtime. 10 mL 11  . isosorbide mononitrate (IMDUR) 30 MG 24 hr tablet Take 1 tablet (30 mg total) by mouth 2 (two) times daily. 180 tablet 3  . isosorbide mononitrate (IMDUR) 60 MG 24 hr tablet Take 1 tablet (60 mg total) by mouth 2 (two) times a day. 180 tablet 3  . Lancets (ONETOUCH ULTRASOFT) lancets Used to check blood sugars four times daily. 200 each 12  . Multiple Vitamin (MULTIVITAMIN WITH MINERALS) TABS tablet Take 1 tablet by mouth daily.    [name] omeprazole (PRILOSEC) 40 MG capsule Take 40 mg by mouth 2 (two) times daily.    .  sodium bicarbonate 650 MG tablet Take 1 tablet (650 mg total) by mouth 2 (two) times daily. 60 tablet 0  . torsemide (DEMADEX) 20 MG tablet Take 1 tablet (20 mg total) by mouth 2 (two) times daily. Take an extra 58m tablet daily prn in the morning for swelling or sob.    . vitamin B-12 (CYANOCOBALAMIN) 1000 MCG tablet Take 1,000 mcg by mouth daily.    . vitamin C (VITAMIN C) 250 MG tablet  Take 1 tablet (250 mg total) by mouth 2 (two) times daily.    . Insulin Pen Needle (BD PEN NEEDLE MICRO U/F) 32G X 6 MM MISC 1 each by Does not apply route 3 (three) times daily. (Patient not taking: Reported on 05/29/2019) 100 each 3  . ondansetron (ZOFRAN) 4 MG tablet Take 4 mg by mouth every 8 (eight) hours as needed for nausea or vomiting.    . traMADol (ULTRAM) 50 MG tablet One to Two Tabs Every Six Hours As Needed For Pain 50 tablet 0    Results for orders placed or performed during the hospital encounter of 05/28/19 (from the past 48 hour(s))  Comprehensive metabolic panel     Status: Abnormal   Collection Time: 05/28/19 10:52 PM  Result Value Ref Range   Sodium 136 135 - 145 mmol/L   Potassium 4.3 3.5 - 5.1 mmol/L   Chloride 103 98 - 111 mmol/L   CO2 20 (L) 22 - 32 mmol/L   Glucose, Bld 90 70 - 99 mg/dL   BUN 66 (H) 6 - 20 mg/dL   Creatinine, Ser 3.25 (H) 0.44 - 1.00 mg/dL   Calcium 8.8 (L) 8.9 - 10.3 mg/dL   Total Protein 8.2 (H) 6.5 - 8.1 g/dL   Albumin 4.0 3.5 - 5.0 g/dL   AST 27 15 - 41 U/L   ALT 23 0 - 44 U/L   Alkaline Phosphatase 144 (H) 38 - 126 U/L   Total Bilirubin 0.4 0.3 - 1.2 mg/dL   GFR calc non Af Amer 15 (L) >60 mL/min   GFR calc Af Amer 17 (L) >60 mL/min   Anion gap 13 5 - 15    Comment: Performed at [facility], [address] [place], [place] [address]  CBC     Status: Abnormal   Collection Time: 05/28/19 10:52 PM  Result Value Ref Range   WBC 9.1 4.0 - 10.5 K/uL   RBC 3.21 (L) 3.87 - 5.11 MIL/uL   Hemoglobin 9.4 (L) 12.0 - 15.0 g/dL   HCT 29.2 (L) 36.0 -  46.0 %   MCV 91.0 80.0 - 100.0 fL   MCH 29.3 26.0 - 34.0 pg   MCHC 32.2 30.0 - 36.0 g/dL   RDW 12.7 11.5 - 15.5 %   Platelets 243 150 - 400 K/uL   nRBC 0.0 0.0 - 0.2 %    Comment: Performed at [facility], [address], [place],  [address]  SARS Coronavirus 2 (CEPHEID - Performed in [place] hospital lab), Hosp Order     Status: None   Collection Time: 05/29/19 12:15 AM   Specimen: Nasopharyngeal Swab  Result Value Ref Range   SARS Coronavirus 2 NEGATIVE NEGATIVE    Comment: (NOTE) If result is NEGATIVE SARS-CoV-2 target nucleic acids are NOT DETECTED. The SARS-CoV-2 RNA is generally detectable in upper and lower  respiratory specimens during the acute phase of infection. The lowest  concentration of SARS-CoV-2 viral copies this assay can detect is 250  copies / mL. A negative result does not preclude SARS-CoV-2 infection  and should not be used as the sole basis for treatment or other  patient management decisions.  A negative result may occur with  improper specimen collection / handling, submission of specimen other  than nasopharyngeal swab, presence of viral mutation(s) within the  areas targeted by this assay, and inadequate number of viral copies  (<250 copies / mL). A negative result must be combined with clinical  observations, patient history, and epidemiological information. If result is POSITIVE SARS-CoV-2 target nucleic acids are DETECTED. The SARS-CoV-2 RNA is generally detectable in upper and lower  respiratory specimens dur ing the acute phase of infection.  Positive  results are indicative of active infection with SARS-CoV-2.  Clinical  correlation with patient history and other diagnostic information is  necessary to determine patient infection status.  Positive results do  not rule out bacterial infection or co-infection with other viruses. If result is PRESUMPTIVE POSTIVE SARS-CoV-2 nucleic acids MAY BE PRESENT.   A presumptive positive  result was obtained on the submitted specimen  and confirmed on repeat testing.  While 2019 novel coronavirus  (SARS-CoV-2) nucleic acids may be present in the submitted sample  additional confirmatory testing may be necessary for epidemiological  and / or clinical management purposes  to differentiate between  SARS-CoV-2 and other Sarbecovirus currently known to infect humans.  If clinically indicated additional testing with an alternate test  methodology [phone]) is advised. The SARS-CoV-2 RNA is generally  detectable in upper and lower respiratory sp ecimens during the acute  phase of infection. The expected result is Negative. Fact Sheet for Patients:  StrictlyIdeas.no Fact Sheet for Healthcare Providers: BankingDealers.co.za This test is not yet approved or cleared by the [place] FDA and has been authorized for detection and/or diagnosis of SARS-CoV-2 by FDA under an Emergency Use Authorization (EUA).  This EUA will remain in effect (meaning this test can be used) for the duration of the COVID-19 declaration under Section 564(b)(1) of the Act, 21 U.S.C. section 360bbb-3(b)(1), unless the authorization is terminated or revoked sooner. Performed at [facility], [address] [place], [place] [address]    No results found.  Review of Systems  Constitutional: Negative for chills and fever.  HENT: Negative for sore throat and tinnitus.   Eyes: Negative for blurred vision and redness.  Respiratory: Negative for cough and shortness of breath.   Cardiovascular: Negative for chest pain, palpitations, orthopnea and PND.  Gastrointestinal: Negative for abdominal pain, diarrhea, nausea and vomiting.  Genitourinary: Negative for dysuria, frequency and urgency.  Musculoskeletal: Positive for falls. Negative for joint pain and myalgias.  Skin: Negative for rash.       No lesions  Neurological: Negative for speech change,  focal weakness and weakness.  Endo/Heme/Allergies: Does not bruise/bleed easily.       No temperature intolerance  Psychiatric/Behavioral: Negative for depression and suicidal ideas.    Blood pressure (!) 186/77, pulse 78, temperature 98.9 F (37.2 C), temperature source Oral, resp. rate 20, height 5' 8"  (1.727 m), weight 131.5 kg, SpO2 99 %. Physical Exam  Vitals reviewed. Constitutional: She is oriented to person, place, and time. She appears well-developed and well-nourished. No distress.  HENT:  Head: Normocephalic and atraumatic.  Mouth/Throat: Oropharynx is clear and moist.  Eyes: Pupils are equal, round, and reactive to light. Conjunctivae and EOM are normal. No scleral icterus.  Neck: Normal range of motion. Neck supple. No JVD present. No tracheal deviation present. No thyromegaly present.  Cardiovascular: Normal rate, regular rhythm and normal heart sounds. Exam reveals no gallop and no friction rub.  No murmur heard. Respiratory: Effort normal and breath sounds normal.  GI: Soft. Bowel sounds are normal. She exhibits no distension. There is no abdominal tenderness.  Genitourinary:    Genitourinary Comments: Deferred   Musculoskeletal: Normal range of motion.        General: No edema.     Comments: Left BKA  Lymphadenopathy:    She has no cervical adenopathy.  Neurological: She is alert and oriented to person, place, and time. No cranial nerve deficit. She exhibits normal muscle tone.  Skin: Skin is warm and dry. No rash noted. No erythema.  Psychiatric: She has a normal mood and affect. Her behavior is normal. Judgment and thought content normal.     Assessment/Plan This is a 58 year old female admitted for wound dehiscence. 1.  Wound dehiscence: Bleeding has clotted at this time.  Consult surgery for wound closure. 2.  Hypertension: Uncontrolled; resume amlodipine and clonidine per home regimen.  Labetalol as needed. 3.  CKD: Stage IV; avoid nephrotoxic agents.   Hydrate with intravenous fluid.  Continue torsemide per home regimen 4.  Diabetes mellitus type 2: Continue basal insulin therapy.  Sliding scale insulin while hospitalized.  Gabapentin for neuropathy 5.  DVT prophylaxis: SCDs 6.  GI prophylaxis: Pantoprazole per home regimen The patient is a full code.  Time spent on admission orders and patient care approximately 45 minutes  Harrie Foreman, MD 05/29/2019, 3:02 AM

## 2019-05-29 NOTE — ED Notes (Signed)
ED TO INPATIENT HANDOFF REPORT  ED Nurse Name and Phone #:  Anson Crofts Name/Age/Gender Rachel Holmes 58 y.o. female Room/Bed: ED07A/ED07A  Code Status   Code Status: Prior  Home/SNF/Other Home Patient oriented to: self, place, time and situation Is this baseline? Yes   Triage Complete: Triage complete  Chief Complaint EMS  Triage Note Pt arrived via [facility] from home with a fall. L BKA on May 05, 2019, ran into the bed with her wheelchair and fell out the wheelchair. Pt landed on her incision and it bust open. Vitals WNL.   Allergies Allergies  Allergen Reactions  . Statins Shortness Of Breath and Other (See Comments)    Wheezing, short of breath  . Penicillins Hives and Other (See Comments)    Has patient had a PCN reaction causing immediate rash, facial/tongue/throat swelling, SOB or lightheadedness with hypotension: No Has patient had a PCN reaction causing severe rash involving mucus membranes or skin necrosis: No Has patient had a PCN reaction that required hospitalization No Has patient had a PCN reaction occurring within the last 10 years: No If all of the above answers are "NO", then may proceed with Cephalosporin use.  . Codeine Nausea Only  . Ibuprofen Other (See Comments)    Reaction:  Raises pts BP  . Percocet [Oxycodone-Acetaminophen] Nausea Only  . Tramadol Nausea Only and Other (See Comments)    Can take if she has eaten. Currently takes with good results   . Vicodin [Hydrocodone-Acetaminophen] Itching and Nausea Only    Level of Care/Admitting Diagnosis ED Disposition    ED Disposition Condition [place] [facility] Area: [facility] [[id number]]  Level of Care: Med-Surg [16]  Covid Evaluation: Asymptomatic Screening Protocol (No Symptoms)  Diagnosis: Wound dehiscence [369010]  Admitting Physician: Harrie Foreman [[id number]]  Attending Physician: Harrie Foreman [phone]  PT Class (Do Not Modify): Observation [104]  PT  Acc Code (Do Not Modify): Observation [10022]       B Medical/Surgery History Past Medical History:  Diagnosis Date  . Allergic rhinitis   . Anemia   . Anxiety   . Arthritis   . Back pain   . Bradycardia   . Breast mass    Patient can no longer palpate specific masses but showed tech general area of concern  . CHF (congestive heart failure) ([place])   . CKD (chronic kidney disease)    STAGE 3  . Constipation   . Diabetes mellitus without complication ([place])   . Diabetic nephropathy ([place])   . Dyspnea   . GERD (gastroesophageal reflux disease)   . Heart murmur   . HLD (hyperlipidemia)   . Hyperparathyroidism ([place])   . Hypertension   . Joint pain   . Leg edema   . Legally blind in left eye, as defined in [place]   . Lymphedema   . Obesity   . Onychomycosis    Past Surgical History:  Procedure Laterality Date  . ABDOMINAL HYSTERECTOMY    . AMPUTATION Left 05/05/2019   Procedure: AMPUTATION BELOW KNEE;  Surgeon: Katha Cabal, MD;  Location: [facility] ORS;  Service: Vascular;  Laterality: Left;  . BREAST BIOPSY Left 2014   FNA 12:00 position - Negative  . EYE SURGERY Left 2007   removed a lens, no lens implanted  . IR FLUORO GUIDE CV LINE RIGHT  12/29/2018  . IR REMOVAL TUN CV CATH W/O FL  02/19/2019  . IR US GUIDE VASC ACCESS RIGHT  12/29/2018  . TEE WITHOUT CARDIOVERSION N/A 12/28/2018   Procedure: TRANSESOPHAGEAL ECHOCARDIOGRAM (TEE);  Surgeon: Sueanne Margarita, MD;  Location: [facility] ENDOSCOPY;  Service: Cardiovascular;  Laterality: N/A;  . VAGINAL HYSTERECTOMY     abdominal hyst, not a vaginal hyst     A IV Location/Drains/Wounds Patient Lines/Drains/Airways Status   Active Line/Drains/Airways    Name:   Placement date:   Placement time:   Site:   Days:   Peripheral IV 05/28/19 Right Hand   05/28/19    2258    Hand   1   Incision (Closed) 05/05/19 Leg Left   05/05/19    1020     24          Intake/Output Last 24 hours No intake or output data in the 24 hours ending  05/29/19 0251  Labs/Imaging Results for orders placed or performed during the hospital encounter of 05/28/19 (from the past 48 hour(s))  Comprehensive metabolic panel     Status: Abnormal   Collection Time: 05/28/19 10:52 PM  Result Value Ref Range   Sodium 136 135 - 145 mmol/L   Potassium 4.3 3.5 - 5.1 mmol/L   Chloride 103 98 - 111 mmol/L   CO2 20 (L) 22 - 32 mmol/L   Glucose, Bld 90 70 - 99 mg/dL   BUN 66 (H) 6 - 20 mg/dL   Creatinine, Ser 3.25 (H) 0.44 - 1.00 mg/dL   Calcium 8.8 (L) 8.9 - 10.3 mg/dL   Total Protein 8.2 (H) 6.5 - 8.1 g/dL   Albumin 4.0 3.5 - 5.0 g/dL   AST 27 15 - 41 U/L   ALT 23 0 - 44 U/L   Alkaline Phosphatase 144 (H) 38 - 126 U/L   Total Bilirubin 0.4 0.3 - 1.2 mg/dL   GFR calc non Af Amer 15 (L) >60 mL/min   GFR calc Af Amer 17 (L) >60 mL/min   Anion gap 13 5 - 15    Comment: Performed at [facility], [address] [place], [place] [address]  CBC     Status: Abnormal   Collection Time: 05/28/19 10:52 PM  Result Value Ref Range   WBC 9.1 4.0 - 10.5 K/uL   RBC 3.21 (L) 3.87 - 5.11 MIL/uL   Hemoglobin 9.4 (L) 12.0 - 15.0 g/dL   HCT 29.2 (L) 36.0 - 46.0 %   MCV 91.0 80.0 - 100.0 fL   MCH 29.3 26.0 - 34.0 pg   MCHC 32.2 30.0 - 36.0 g/dL   RDW 12.7 11.5 - 15.5 %   Platelets 243 150 - 400 K/uL   nRBC 0.0 0.0 - 0.2 %    Comment: Performed at [facility], [address], [place], [place] [address]  SARS Coronavirus 2 (CEPHEID - Performed in [place] hospital lab), Hosp Order     Status: None   Collection Time: 05/29/19 12:15 AM   Specimen: Nasopharyngeal Swab  Result Value Ref Range   SARS Coronavirus 2 NEGATIVE NEGATIVE    Comment: (NOTE) If result is NEGATIVE SARS-CoV-2 target nucleic acids are NOT DETECTED. The SARS-CoV-2 RNA is generally detectable in upper and lower  respiratory specimens during the acute phase of infection. The lowest  concentration of SARS-CoV-2 viral copies this assay can detect is 250  copies / mL.  A negative result does not preclude SARS-CoV-2 infection  and should not be used as the sole basis for treatment or other  patient management decisions.  A negative result may occur  with  improper specimen collection / handling, submission of specimen other  than nasopharyngeal swab, presence of viral mutation(s) within the  areas targeted by this assay, and inadequate number of viral copies  (<250 copies / mL). A negative result must be combined with clinical  observations, patient history, and epidemiological information. If result is POSITIVE SARS-CoV-2 target nucleic acids are DETECTED. The SARS-CoV-2 RNA is generally detectable in upper and lower  respiratory specimens dur ing the acute phase of infection.  Positive  results are indicative of active infection with SARS-CoV-2.  Clinical  correlation with patient history and other diagnostic information is  necessary to determine patient infection status.  Positive results do  not rule out bacterial infection or co-infection with other viruses. If result is PRESUMPTIVE POSTIVE SARS-CoV-2 nucleic acids MAY BE PRESENT.   A presumptive positive result was obtained on the submitted specimen  and confirmed on repeat testing.  While 2019 novel coronavirus  (SARS-CoV-2) nucleic acids may be present in the submitted sample  additional confirmatory testing may be necessary for epidemiological  and / or clinical management purposes  to differentiate between  SARS-CoV-2 and other Sarbecovirus currently known to infect humans.  If clinically indicated additional testing with an alternate test  methodology [phone]) is advised. The SARS-CoV-2 RNA is generally  detectable in upper and lower respiratory sp ecimens during the acute  phase of infection. The expected result is Negative. Fact Sheet for Patients:  StrictlyIdeas.no Fact Sheet for Healthcare Providers: BankingDealers.co.za This test is not  yet approved or cleared by the [place] FDA and has been authorized for detection and/or diagnosis of SARS-CoV-2 by FDA under an Emergency Use Authorization (EUA).  This EUA will remain in effect (meaning this test can be used) for the duration of the COVID-19 declaration under Section 564(b)(1) of the Act, 21 U.S.C. section 360bbb-3(b)(1), unless the authorization is terminated or revoked sooner. Performed at [facility], [address] [place], [place] [address]    No results found.  Pending Labs [employer] (From admission, onward)    Start     Ordered   05/29/19 0220  Protime-INR  ONCE - STAT,   STAT    Comments: REDRAW - lab needs blue top please    05/29/19 0220   Signed and Held  TSH  Add-on,   R     Signed and Held          Vitals/Pain Today's Vitals   05/28/19 2353 05/29/19 0000 05/29/19 0030 05/29/19 0205  BP:  (!) 195/72 (!) 186/77   Pulse:  76 78   Resp:  19 20   Temp:      TempSrc:      SpO2:  100% 99%   Weight:      Height:      PainSc: 6    4     Isolation Precautions No active isolations  Medications Medications  traMADol (ULTRAM) tablet 100 mg (100 mg Oral Given 05/29/19 0037)    Mobility manual wheelchair Low fall risk   Focused Assessments surgical, needs incison revision   R Recommendations: See Admitting Provider Note  Report given to:   Additional Notes:  Amputation was on July 1

## 2019-05-29 NOTE — Progress Notes (Signed)
DISCHARGE NOTE:  Pt given discharge instructions. Pt verbalized understanding. Pt wheeled to car by staff.

## 2019-05-29 NOTE — Consult Note (Signed)
Reason for Consult:LEFT BKA stump dehiscence Referring Physician: Dr. Gaspar Bidding is an 58 y.o. female.  HPI: S/P [place] BKA on 7/1. Was doing well and had a stump shrinker placed. Seen in Vascular Surgery office  5days ago by Dr. Delana Meyer. The patient fell while transferring from her wheelchair, landing on her stump. The mid incision dehisced with bleeding. Currently mild pain, otherwise without complaint.  Past Medical History:  Diagnosis Date  . Allergic rhinitis   . Anemia   . Anxiety   . Arthritis   . Back pain   . Bradycardia   . Breast mass    Patient can no longer palpate specific masses but showed tech general area of concern  . CHF (congestive heart failure) ([place])   . CKD (chronic kidney disease)    STAGE 3  . Constipation   . Diabetes mellitus without complication ([place])   . Diabetic nephropathy ([place])   . Dyspnea   . GERD (gastroesophageal reflux disease)   . Heart murmur   . HLD (hyperlipidemia)   . Hyperparathyroidism ([place])   . Hypertension   . Joint pain   . Leg edema   . Legally blind in left eye, as defined in [place]   . Lymphedema   . Obesity   . Onychomycosis     Past Surgical History:  Procedure Laterality Date  . ABDOMINAL HYSTERECTOMY    . AMPUTATION Left 05/05/2019   Procedure: AMPUTATION BELOW KNEE;  Surgeon: Katha Cabal, MD;  Location: [facility] ORS;  Service: Vascular;  Laterality: Left;  . BREAST BIOPSY Left 2014   FNA 12:00 position - Negative  . EYE SURGERY Left 2007   removed a lens, no lens implanted  . IR FLUORO GUIDE CV LINE RIGHT  12/29/2018  . IR REMOVAL TUN CV CATH W/O FL  02/19/2019  . IR US GUIDE VASC ACCESS RIGHT  12/29/2018  . TEE WITHOUT CARDIOVERSION N/A 12/28/2018   Procedure: TRANSESOPHAGEAL ECHOCARDIOGRAM (TEE);  Surgeon: Sueanne Margarita, MD;  Location: [facility] ENDOSCOPY;  Service: Cardiovascular;  Laterality: N/A;  . VAGINAL HYSTERECTOMY     abdominal hyst, not a vaginal hyst    Family History  Problem Relation Age of  Onset  . Breast cancer Sister 17  . Diabetes Sister   . Diabetes Mother   . Hypertension Mother   . Hyperlipidemia Mother   . Eating disorder Mother   . Obesity Mother     Social History:  reports that she has never smoked. She has never used smokeless tobacco. She reports that she does not drink alcohol or use drugs.  Allergies:  Allergies  Allergen Reactions  . Statins Shortness Of Breath and Other (See Comments)    Wheezing, short of breath  . Penicillins Hives and Other (See Comments)    Has patient had a PCN reaction causing immediate rash, facial/tongue/throat swelling, SOB or lightheadedness with hypotension: No Has patient had a PCN reaction causing severe rash involving mucus membranes or skin necrosis: No Has patient had a PCN reaction that required hospitalization No Has patient had a PCN reaction occurring within the last 10 years: No If all of the above answers are "NO", then may proceed with Cephalosporin use.  . Codeine Nausea Only  . Ibuprofen Other (See Comments)    Reaction:  Raises pts BP  . Percocet [Oxycodone-Acetaminophen] Nausea Only  . Tramadol Nausea Only and Other (See Comments)    Can take if she has eaten. Currently takes with good results   .  Vicodin [Hydrocodone-Acetaminophen] Itching and Nausea Only    Medications: I have reviewed the patient's current medications.  Results for orders placed or performed during the hospital encounter of 05/28/19 (from the past 48 hour(s))  Comprehensive metabolic panel     Status: Abnormal   Collection Time: 05/28/19 10:52 PM  Result Value Ref Range   Sodium 136 135 - 145 mmol/L   Potassium 4.3 3.5 - 5.1 mmol/L   Chloride 103 98 - 111 mmol/L   CO2 20 (L) 22 - 32 mmol/L   Glucose, Bld 90 70 - 99 mg/dL   BUN 66 (H) 6 - 20 mg/dL   Creatinine, Ser 3.25 (H) 0.44 - 1.00 mg/dL   Calcium 8.8 (L) 8.9 - 10.3 mg/dL   Total Protein 8.2 (H) 6.5 - 8.1 g/dL   Albumin 4.0 3.5 - 5.0 g/dL   AST 27 15 - 41 U/L   ALT 23 0  - 44 U/L   Alkaline Phosphatase 144 (H) 38 - 126 U/L   Total Bilirubin 0.4 0.3 - 1.2 mg/dL   GFR calc non Af Amer 15 (L) >60 mL/min   GFR calc Af Amer 17 (L) >60 mL/min   Anion gap 13 5 - 15    Comment: Performed at [facility], [address] [place], [place] [address]  CBC     Status: Abnormal   Collection Time: 05/28/19 10:52 PM  Result Value Ref Range   WBC 9.1 4.0 - 10.5 K/uL   RBC 3.21 (L) 3.87 - 5.11 MIL/uL   Hemoglobin 9.4 (L) 12.0 - 15.0 g/dL   HCT 29.2 (L) 36.0 - 46.0 %   MCV 91.0 80.0 - 100.0 fL   MCH 29.3 26.0 - 34.0 pg   MCHC 32.2 30.0 - 36.0 g/dL   RDW 12.7 11.5 - 15.5 %   Platelets 243 150 - 400 K/uL   nRBC 0.0 0.0 - 0.2 %    Comment: Performed at [facility], [address] [place], [place] [address]  TSH     Status: None   Collection Time: 05/28/19 10:52 PM  Result Value Ref Range   TSH 1.701 0.350 - 4.500 uIU/mL    Comment: Performed by a 3rd Generation assay with a functional sensitivity of <=0.01 uIU/mL. Performed at [facility], [address] [place], [place] [address]   SARS Coronavirus 2 (CEPHEID - Performed in [facility] hospital lab), Hosp Order     Status: None   Collection Time: 05/29/19 12:15 AM   Specimen: Nasopharyngeal Swab  Result Value Ref Range   SARS Coronavirus 2 NEGATIVE NEGATIVE    Comment: (NOTE) If result is NEGATIVE SARS-CoV-2 target nucleic acids are NOT DETECTED. The SARS-CoV-2 RNA is generally detectable in upper and lower  respiratory specimens during the acute phase of infection. The lowest  concentration of SARS-CoV-2 viral copies this assay can detect is 250  copies / mL. A negative result does not preclude SARS-CoV-2 infection  and should not be used as the sole basis for treatment or other  patient management decisions.  A negative result may occur with  improper specimen collection / handling, submission of specimen other  than nasopharyngeal swab, presence of viral mutation(s) within  the  areas targeted by this assay, and inadequate number of viral copies  (<250 copies / mL). A negative result must be combined with clinical  observations, patient history, and epidemiological information. If result is POSITIVE SARS-CoV-2 target nucleic acids are DETECTED. The SARS-CoV-2 RNA is  generally detectable in upper and lower  respiratory specimens dur ing the acute phase of infection.  Positive  results are indicative of active infection with SARS-CoV-2.  Clinical  correlation with patient history and other diagnostic information is  necessary to determine patient infection status.  Positive results do  not rule out bacterial infection or co-infection with other viruses. If result is PRESUMPTIVE POSTIVE SARS-CoV-2 nucleic acids MAY BE PRESENT.   A presumptive positive result was obtained on the submitted specimen  and confirmed on repeat testing.  While 2019 novel coronavirus  (SARS-CoV-2) nucleic acids may be present in the submitted sample  additional confirmatory testing may be necessary for epidemiological  and / or clinical management purposes  to differentiate between  SARS-CoV-2 and other Sarbecovirus currently known to infect humans.  If clinically indicated additional testing with an alternate test  methodology [phone]) is advised. The SARS-CoV-2 RNA is generally  detectable in upper and lower respiratory sp ecimens during the acute  phase of infection. The expected result is Negative. Fact Sheet for Patients:  StrictlyIdeas.no Fact Sheet for Healthcare Providers: BankingDealers.co.za This test is not yet approved or cleared by the [place] FDA and has been authorized for detection and/or diagnosis of SARS-CoV-2 by FDA under an Emergency Use Authorization (EUA).  This EUA will remain in effect (meaning this test can be used) for the duration of the COVID-19 declaration under Section 564(b)(1) of the Act, 21  U.S.C. section 360bbb-3(b)(1), unless the authorization is terminated or revoked sooner. Performed at [facility], [address] [place], [place] [address]   Protime-INR     Status: Abnormal   Collection Time: 05/29/19  2:45 AM  Result Value Ref Range   Prothrombin Time 11.3 (L) 11.4 - 15.2 seconds   INR 0.8 0.8 - 1.2    Comment: (NOTE) INR goal varies based on device and disease states. Performed at [facility], [address] [place], [place] [address]   MRSA PCR Screening     Status: None   Collection Time: 05/29/19  4:44 AM   Specimen: Nasal Mucosa; Nasopharyngeal  Result Value Ref Range   MRSA by PCR NEGATIVE NEGATIVE    Comment:        The GeneXpert MRSA Assay (FDA approved for NASAL specimens only), is one component of a comprehensive MRSA colonization surveillance program. It is not intended to diagnose MRSA infection nor to guide or monitor treatment for MRSA infections. Performed at [facility], [address] [place], [place] [address]   Glucose, capillary     Status: Abnormal   Collection Time: 05/29/19  8:07 AM  Result Value Ref Range   Glucose-Capillary 202 (H) 70 - 99 mg/dL    No results found.  Review of Systems  Constitutional: Negative.   HENT: Negative.   Respiratory: Negative.   Cardiovascular: Negative.   Gastrointestinal: Negative.   Neurological: Negative.    Blood pressure (!) 175/72, pulse 80, temperature 98.3 F (36.8 C), resp. rate 18, height 5\' 8"  (1.727 m), weight 131.5 kg, SpO2 100 %. Physical Exam  Nursing note and vitals reviewed. Constitutional: She is oriented to person, place, and time. She appears well-developed and well-nourished.  Neck: Normal range of motion. Neck supple.  Cardiovascular: Normal rate and regular rhythm.  Respiratory: Effort normal and breath sounds normal.  GI: Soft. She exhibits no distension.  Musculoskeletal: Normal range of motion.        General: No edema.      Comments:  LEFT BKA: Superficial stump dehiscence. Mid incision, skin and minimal soft tissue, superficial fat layer exposed. Stump soft, mild tenderness, no erythema, remainder of incision- C/D/I  Neurological: She is alert and oriented to person, place, and time.  Skin: Skin is warm.    Assessment/Plan: LEFT BKA stump dehiscence. Superficial. Stump cleaned and examined at bedside. Wet-Dry dressing placed.  OK to D/C home with wound care on Monday. Recommend wet- dry dressings daily or wound VAC.  Follow Up with Dr. Delana Meyer next week.  Esco, Miechia A 05/29/2019, 9:21 AM

## 2019-05-31 ENCOUNTER — Telehealth (INDEPENDENT_AMBULATORY_CARE_PROVIDER_SITE_OTHER): Payer: Self-pay

## 2019-05-31 NOTE — Telephone Encounter (Signed)
Home health nurse called stating the patient had fell over the weekend and has open wound on left bka and was requesting wound care orders.I spoke with Dr Delana Meyer and he recommended for the nurse to use Aquacel with guaze and also the patient can continue to use her shrinker. I informed home health nurse and patient with information and at this time patient will keep upcoming appointment

## 2019-06-01 ENCOUNTER — Other Ambulatory Visit: Payer: Self-pay

## 2019-06-03 ENCOUNTER — Other Ambulatory Visit: Payer: Self-pay

## 2019-06-03 ENCOUNTER — Encounter: Payer: Self-pay | Admitting: Endocrinology

## 2019-06-03 ENCOUNTER — Ambulatory Visit (INDEPENDENT_AMBULATORY_CARE_PROVIDER_SITE_OTHER): Payer: Medicare HMO | Admitting: Endocrinology

## 2019-06-03 VITALS — BP 148/70 | HR 66 | Ht 68.0 in | Wt 295.0 lb

## 2019-06-03 DIAGNOSIS — E1165 Type 2 diabetes mellitus with hyperglycemia: Secondary | ICD-10-CM

## 2019-06-03 DIAGNOSIS — E1142 Type 2 diabetes mellitus with diabetic polyneuropathy: Secondary | ICD-10-CM | POA: Diagnosis not present

## 2019-06-03 DIAGNOSIS — IMO0002 Reserved for concepts with insufficient information to code with codable children: Secondary | ICD-10-CM

## 2019-06-03 MED ORDER — FREESTYLE LIBRE 14 DAY READER DEVI: 1.0000 | Freq: Once | 0 refills | Status: DC

## 2019-06-03 MED ORDER — FREESTYLE LIBRE 14 DAY SENSOR MISC: 1.0000 | 3 refills | Status: DC

## 2019-06-03 MED ORDER — NOVOLOG FLEXPEN 100 UNIT/ML ~~LOC~~ SOPN: PEN_INJECTOR | SUBCUTANEOUS | 3 refills | Status: DC

## 2019-06-03 NOTE — Progress Notes (Signed)
Subjective:    Patient ID: Rachel Holmes, female    DOB: [birth year], 58 y.o.   MRN: [id number]  HPI Pt returns for f/u of diabetes mellitus: DM type: Insulin-requiring type 2 Dx'ed: 1988, during a pregnancy, but it persisted after Complications: polyneuropathy, renal failure, leg ulcer, left BKA, PAD, and PDR.   Therapy: insulin since 8841, and trulicity DKA: never Severe hypoglycemia: 2 episodes (both many years ago).   Pancreatitis: never Pancreatic imaging: normal on 2008 [name].   Other: a trial off insulin in early 2020 was unsuccessful; renal failure limits rx options; she does not need basal insulin.   Interval history: no cbg record, but pt states cbg's vary from 130-225.  It is in general higher as the day goes on.  She says she seldom misses the insulin.  She was restarted on basal insulin (Levemir 9 units QHS, and Novolog 3 times a day (just before each meal), 08-18-11 units.   Past Medical History:  Diagnosis Date  . Allergic rhinitis   . Anemia   . Anxiety   . Arthritis   . Back pain   . Bradycardia   . Breast mass    Patient can no longer palpate specific masses but showed tech general area of concern  . CHF (congestive heart failure) ([place])   . CKD (chronic kidney disease)    STAGE 3  . Constipation   . Diabetes mellitus without complication ([place])   . Diabetic nephropathy ([place])   . Dyspnea   . GERD (gastroesophageal reflux disease)   . Heart murmur   . HLD (hyperlipidemia)   . Hyperparathyroidism ([place])   . Hypertension   . Joint pain   . Leg edema   . Legally blind in left eye, as defined in [place]   . Lymphedema   . Obesity   . Onychomycosis     Past Surgical History:  Procedure Laterality Date  . ABDOMINAL HYSTERECTOMY    . AMPUTATION Left 05/05/2019   Procedure: AMPUTATION BELOW KNEE;  Surgeon: Katha Cabal, MD;  Location: [facility] ORS;  Service: Vascular;  Laterality: Left;  . BREAST BIOPSY Left 2014   FNA 12:00 position - Negative  . EYE SURGERY  Left 2007   removed a lens, no lens implanted  . IR FLUORO GUIDE CV LINE RIGHT  12/29/2018  . IR REMOVAL TUN CV CATH W/O FL  02/19/2019  . IR US GUIDE VASC ACCESS RIGHT  12/29/2018  . TEE WITHOUT CARDIOVERSION N/A 12/28/2018   Procedure: TRANSESOPHAGEAL ECHOCARDIOGRAM (TEE);  Surgeon: Sueanne Margarita, MD;  Location: [facility] ENDOSCOPY;  Service: Cardiovascular;  Laterality: N/A;  . VAGINAL HYSTERECTOMY     abdominal hyst, not a vaginal hyst    Social History   Socioeconomic History  . Marital status: Divorced    Spouse name: Not on file  . Number of children: 1  . Years of education: Not on file  . Highest education level: Not on file  Occupational History  . Occupation: Glass blower/designer    Comment: disability  Social Needs  . Financial resource strain: Not on file  . Food insecurity    Worry: Not on file    Inability: Not on file  . Transportation needs    Medical: Not on file    Non-medical: Not on file  Tobacco Use  . Smoking status: Never Smoker  . Smokeless tobacco: Never Used  Substance and Sexual Activity  . Alcohol use: No  . Drug use: Never  .  Sexual activity: Not on file  Lifestyle  . Physical activity    Days per week: Not on file    Minutes per session: Not on file  . Stress: Not on file  Relationships  . Social Herbalist on phone: Not on file    Gets together: Not on file    Attends religious service: Not on file    Active member of club or organization: Not on file    Attends meetings of clubs or organizations: Not on file    Relationship status: Not on file  . Intimate partner violence    Fear of current or ex partner: Not on file    Emotionally abused: Not on file    Physically abused: Not on file    Forced sexual activity: Not on file  Other Topics Concern  . Not on file  Social History Narrative   ** Merged History Encounter **    on disability    Current Outpatient Medications on File Prior to Visit  Medication Sig Dispense Refill  .  albuterol (PROVENTIL HFA;VENTOLIN HFA) 108 (90 Base) MCG/ACT inhaler Inhale 1-2 puffs into the lungs every 6 (six) hours as needed for wheezing or shortness of breath.    [name] amLODipine (NORVASC) 10 MG tablet Take 10 mg by mouth daily.    [name] aspirin EC 81 MG EC tablet Take 1 tablet (81 mg total) by mouth daily.    . Blood Glucose Monitoring Suppl (ONE TOUCH ULTRA 2) w/Device KIT 1 Device by Does not apply route daily. 1 each 0  . clobetasol cream (TEMOVATE) 4.70 % Apply 1 application topically 2 (two) times daily.    . cloNIDine (CATAPRES) 0.3 MG tablet Take 0.3 mg by mouth 2 (two) times daily.    [name] docusate sodium (COLACE) 100 MG capsule Take 1 capsule (100 mg total) by mouth 2 (two) times daily. 10 capsule 0  . Dulaglutide (TRULICITY) 1.5 JG/2.8ZM SOPN Inject 1.5 mg into the skin once a week. (Patient taking differently: Inject 1.5 mg into the skin once a week. ) 4 pen 11  . DULoxetine (CYMBALTA) 30 MG capsule Take 30 mg by mouth daily.     . Ensure Max Protein (ENSURE MAX PROTEIN) LIQD Take 330 mLs (11 oz total) by mouth 2 (two) times daily.    . fluticasone (FLONASE) 50 MCG/ACT nasal spray Place 1 spray into both nostrils daily.     [name] gabapentin (NEURONTIN) 600 MG tablet Take 600 mg by mouth at bedtime.    [name] glucosamine-chondroitin 500-400 MG tablet Take 1 tablet by mouth 2 (two) times daily.    [name] glucose blood (ONETOUCH VERIO) test strip 1 each by Other route 2 (two) times a day. Use to monitor glucose levels BID; E11.42 100 each 3  . Insulin Pen Needle (BD PEN NEEDLE MICRO U/F) 32G X 6 MM MISC 1 each by Does not apply route 3 (three) times daily. 100 each 3  . isosorbide mononitrate (IMDUR) 30 MG 24 hr tablet Take 1 tablet (30 mg total) by mouth 2 (two) times daily. 180 tablet 3  . Lancets (ONETOUCH ULTRASOFT) lancets Used to check blood sugars four times daily. 200 each 12  . Multiple Vitamin (MULTIVITAMIN WITH MINERALS) TABS tablet Take 1 tablet by mouth daily.    [name] omeprazole (PRILOSEC) 40 MG  capsule Take 40 mg by mouth 2 (two) times daily.    . ondansetron (ZOFRAN) 4 MG tablet Take 4 mg by mouth every 8 (  eight) hours as needed for nausea or vomiting.    . sodium bicarbonate 650 MG tablet Take 1 tablet (650 mg total) by mouth 2 (two) times daily. 60 tablet 0  . torsemide (DEMADEX) 20 MG tablet Take 1 tablet (20 mg total) by mouth 2 (two) times daily. Take an extra 26m tablet daily prn in the morning for swelling or sob.    . traMADol (ULTRAM) 50 MG tablet One to Two Tabs Every Six Hours As Needed For Pain 50 tablet 0  . vitamin B-12 (CYANOCOBALAMIN) 1000 MCG tablet Take 1,000 mcg by mouth daily.    . vitamin C (VITAMIN C) 250 MG tablet Take 1 tablet (250 mg total) by mouth 2 (two) times daily.     No current facility-administered medications on file prior to visit.     Allergies  Allergen Reactions  . Statins Shortness Of Breath and Other (See Comments)    Wheezing, short of breath  . Penicillins Hives and Other (See Comments)    Has patient had a PCN reaction causing immediate rash, facial/tongue/throat swelling, SOB or lightheadedness with hypotension: No Has patient had a PCN reaction causing severe rash involving mucus membranes or skin necrosis: No Has patient had a PCN reaction that required hospitalization No Has patient had a PCN reaction occurring within the last 10 years: No If all of the above answers are "NO", then may proceed with Cephalosporin use.  . Codeine Nausea Only  . Ibuprofen Other (See Comments)    Reaction:  Raises pts BP  . Percocet [Oxycodone-Acetaminophen] Nausea Only  . Tramadol Nausea Only and Other (See Comments)    Can take if she has eaten. Currently takes with good results   . Vicodin [Hydrocodone-Acetaminophen] Itching and Nausea Only    Family History  Problem Relation Age of Onset  . Breast cancer Sister 567 . Diabetes Sister   . Diabetes Mother   . Hypertension Mother   . Hyperlipidemia Mother   . Eating disorder Mother   .  Obesity Mother     BP (!) 148/70 (BP Location: Left Arm, Patient Position: Sitting, Cuff Size: Large)   Pulse 66   Ht 5' 8" (1.727 m)   Wt 295 lb (133.8 kg)   SpO2 95%   BMI 44.85 kg/m    Review of Systems She denies hypoglycemia    Objective:   Physical Exam VITAL SIGNS:  See vs page GENERAL: no distress Pulses: right dorsalis pedis is intact. MSK: no deformity of the right foot CV: 1+ right leg edema Skin:  no ulcer on the right foot.  normal color and temp on the right foot Neuro: sensation is intact to touch on the right foot Ext: there is onychomycosis of the right foot toenails.  Left BKA  Lab Results  Component Value Date   HGBA1C 7.3 (H) 05/06/2019   Lab Results  Component Value Date   CREATININE 3.25 (H) 05/28/2019   BUN 66 (H) 05/28/2019   NA 136 05/28/2019   K 4.3 05/28/2019   CL 103 05/28/2019   CO2 20 (L) 05/28/2019       Assessment & Plan:  HTN: is noted today Insulin-requiring type 2 DM, with PAD: Based on the pattern of her cbg's, she needs some adjustment in her therapy Renal insuff: in this setting, she does not need basal insulin.  Patient Instructions  Your blood pressure is high today.  Please see your primary care provider soon, to have it rechecked Please change the  Novolog to  3 times a day (just before each meal), 13-17-15 units Please continue the same Trulicity. check your blood sugar twice a day.  vary the time of day when you check, between before the 3 meals, and at bedtime.  also check if you have symptoms of your blood sugar being too high or too low.  please keep a record of the readings and bring it to your next appointment here (or you can bring the meter itself).  You can write it on any piece of paper.  please call us sooner if your blood sugar goes below 70, or if you have a lot of readings over 200.  I have sent a prescription to your pharmacy, for the continuous glucose monitor.  Please call us oif you want to see a diabetes  educator, to help you get started with this.   Please come back for a follow-up appointment in 2 months.

## 2019-06-03 NOTE — Patient Instructions (Addendum)
Your blood pressure is high today.  Please see your primary care provider soon, to have it rechecked Please change the Novolog to  3 times a day (just before each meal), 13-17-15 units Please continue the same Trulicity. check your blood sugar twice a day.  vary the time of day when you check, between before the 3 meals, and at bedtime.  also check if you have symptoms of your blood sugar being too high or too low.  please keep a record of the readings and bring it to your next appointment here (or you can bring the meter itself).  You can write it on any piece of paper.  please call us sooner if your blood sugar goes below 70, or if you have a lot of readings over 200.  I have sent a prescription to your pharmacy, for the continuous glucose monitor.  Please call us oif you want to see a diabetes educator, to help you get started with this.   Please come back for a follow-up appointment in 2 months.

## 2019-06-10 ENCOUNTER — Ambulatory Visit (INDEPENDENT_AMBULATORY_CARE_PROVIDER_SITE_OTHER): Payer: Medicare HMO | Admitting: Vascular Surgery

## 2019-06-10 ENCOUNTER — Encounter (INDEPENDENT_AMBULATORY_CARE_PROVIDER_SITE_OTHER): Payer: Self-pay | Admitting: Vascular Surgery

## 2019-06-10 ENCOUNTER — Other Ambulatory Visit: Payer: Self-pay

## 2019-06-10 VITALS — BP 148/80 | HR 71 | Resp 17 | Ht 68.0 in | Wt 291.0 lb

## 2019-06-10 DIAGNOSIS — M86672 Other chronic osteomyelitis, left ankle and foot: Secondary | ICD-10-CM

## 2019-06-10 NOTE — Progress Notes (Signed)
Patient ID: Rachel Holmes, female   DOB: 1961/03/24, 58 y.o.   MRN: [id number]  Chief Complaint  Patient presents with  . Follow-up    check stump    HPI Rachel Holmes is a 58 y.o. female.    Patient returns for follow-up status post left below-knee amputation on 05/05/2019.  She fell injuring the anterior portion of the suture line.  She has been treating it with Aquacell.   Past Medical History:  Diagnosis Date  . Allergic rhinitis   . Anemia   . Anxiety   . Arthritis   . Back pain   . Bradycardia   . Breast mass    Patient can no longer palpate specific masses but showed tech general area of concern  . CHF (congestive heart failure) ([place])   . CKD (chronic kidney disease)    STAGE 3  . Constipation   . Diabetes mellitus without complication ([place])   . Diabetic nephropathy ([place])   . Dyspnea   . GERD (gastroesophageal reflux disease)   . Heart murmur   . HLD (hyperlipidemia)   . Hyperparathyroidism ([place])   . Hypertension   . Joint pain   . Leg edema   . Legally blind in left eye, as defined in [place]   . Lymphedema   . Obesity   . Onychomycosis     Past Surgical History:  Procedure Laterality Date  . ABDOMINAL HYSTERECTOMY    . AMPUTATION Left 05/05/2019   Procedure: AMPUTATION BELOW KNEE;  Surgeon: Katha Cabal, MD;  Location: [facility] ORS;  Service: Vascular;  Laterality: Left;  . BREAST BIOPSY Left 2014   FNA 12:00 position - Negative  . EYE SURGERY Left 2007   removed a lens, no lens implanted  . IR FLUORO GUIDE CV LINE RIGHT  12/29/2018  . IR REMOVAL TUN CV CATH W/O FL  02/19/2019  . IR US GUIDE VASC ACCESS RIGHT  12/29/2018  . TEE WITHOUT CARDIOVERSION N/A 12/28/2018   Procedure: TRANSESOPHAGEAL ECHOCARDIOGRAM (TEE);  Surgeon: Sueanne Margarita, MD;  Location: [facility] ENDOSCOPY;  Service: Cardiovascular;  Laterality: N/A;  . VAGINAL HYSTERECTOMY     abdominal hyst, not a vaginal hyst      Allergies  Allergen Reactions  . Statins Shortness Of Breath and  Other (See Comments)    Wheezing, short of breath  . Penicillins Hives and Other (See Comments)    Has patient had a PCN reaction causing immediate rash, facial/tongue/throat swelling, SOB or lightheadedness with hypotension: No Has patient had a PCN reaction causing severe rash involving mucus membranes or skin necrosis: No Has patient had a PCN reaction that required hospitalization No Has patient had a PCN reaction occurring within the last 10 years: No If all of the above answers are "NO", then may proceed with Cephalosporin use.  . Codeine Nausea Only  . Ibuprofen Other (See Comments)    Reaction:  Raises pts BP  . Percocet [Oxycodone-Acetaminophen] Nausea Only  . Tramadol Nausea Only and Other (See Comments)    Can take if she has eaten. Currently takes with good results   . Vicodin [Hydrocodone-Acetaminophen] Itching and Nausea Only    Current Outpatient Medications  Medication Sig Dispense Refill  . albuterol (PROVENTIL HFA;VENTOLIN HFA) 108 (90 Base) MCG/ACT inhaler Inhale 1-2 puffs into the lungs every 6 (six) hours as needed for wheezing or shortness of breath.    [name] amLODipine (NORVASC) 10 MG tablet Take 10 mg by mouth daily.    [name]  aspirin EC 81 MG EC tablet Take 1 tablet (81 mg total) by mouth daily.    . Blood Glucose Monitoring Suppl (ONE TOUCH ULTRA 2) w/Device KIT 1 Device by Does not apply route daily. 1 each 0  . clobetasol cream (TEMOVATE) 0.05 % Apply 1 application topically 2 (two) times daily.    . cloNIDine (CATAPRES) 0.3 MG tablet Take 0.3 mg by mouth 2 (two) times daily.    . Continuous Blood Gluc Sensor (FREESTYLE LIBRE 14 DAY SENSOR) MISC 1 Device by Does not apply route every 14 (fourteen) days. 6 each 3  . docusate sodium (COLACE) 100 MG capsule Take 1 capsule (100 mg total) by mouth 2 (two) times daily. 10 capsule 0  . Dulaglutide (TRULICITY) 1.5 MG/0.5ML SOPN Inject 1.5 mg into the skin once a week. (Patient taking differently: Inject 1.5 mg into the skin  once a week. ) 4 pen 11  . DULoxetine (CYMBALTA) 30 MG capsule Take 30 mg by mouth daily.     . Ensure Max Protein (ENSURE MAX PROTEIN) LIQD Take 330 mLs (11 oz total) by mouth 2 (two) times daily.    . fluticasone (FLONASE) 50 MCG/ACT nasal spray Place 1 spray into both nostrils daily.     . gabapentin (NEURONTIN) 600 MG tablet Take 600 mg by mouth at bedtime.    . glucosamine-chondroitin 500-400 MG tablet Take 1 tablet by mouth 2 (two) times daily.    . glucose blood (ONETOUCH VERIO) test strip 1 each by Other route 2 (two) times a day. Use to monitor glucose levels BID; E11.42 100 each 3  . insulin aspart (NOVOLOG FLEXPEN) 100 UNIT/ML FlexPen 3 times a day (just before each meal), 13-17-15 units. 25 pen 3  . Insulin Pen Needle (BD PEN NEEDLE MICRO U/F) 32G X 6 MM MISC 1 each by Does not apply route 3 (three) times daily. 100 each 3  . isosorbide mononitrate (IMDUR) 30 MG 24 hr tablet Take 1 tablet (30 mg total) by mouth 2 (two) times daily. 180 tablet 3  . Lancets (ONETOUCH ULTRASOFT) lancets Used to check blood sugars four times daily. 200 each 12  . Multiple Vitamin (MULTIVITAMIN WITH MINERALS) TABS tablet Take 1 tablet by mouth daily.    . omeprazole (PRILOSEC) 40 MG capsule Take 40 mg by mouth 2 (two) times daily.    . ondansetron (ZOFRAN) 4 MG tablet Take 4 mg by mouth every 8 (eight) hours as needed for nausea or vomiting.    . sodium bicarbonate 650 MG tablet Take 1 tablet (650 mg total) by mouth 2 (two) times daily. 60 tablet 0  . torsemide (DEMADEX) 20 MG tablet Take 1 tablet (20 mg total) by mouth 2 (two) times daily. Take an extra 20mg tablet daily prn in the morning for swelling or sob.    . traMADol (ULTRAM) 50 MG tablet One to Two Tabs Every Six Hours As Needed For Pain 50 tablet 0  . vitamin B-12 (CYANOCOBALAMIN) 1000 MCG tablet Take 1,000 mcg by mouth daily.    . vitamin C (VITAMIN C) 250 MG tablet Take 1 tablet (250 mg total) by mouth 2 (two) times daily.     No current  facility-administered medications for this visit.         Physical Exam BP (!) 148/80 (BP Location: Right Arm)   Pulse 71   Resp 17   Ht 5' 8" (1.727 m)   Wt 291 lb (132 kg)   BMI 44.25 kg/m  Gen:    WD/WN, NAD Skin: incision C/D/I with the exception of the anterior portion which is clean and healing well     Assessment/Plan: 1. Chronic osteomyelitis of ankle and foot, left [facility]) Continue with Aquacell  Keep appointment on 8/31      [name] 06/10/2019, 3:18 PM   This note was created with Dragon medical transcription system.  Any errors from dictation are unintentional.

## 2019-06-13 ENCOUNTER — Encounter (INDEPENDENT_AMBULATORY_CARE_PROVIDER_SITE_OTHER): Payer: Self-pay | Admitting: Vascular Surgery

## 2019-06-15 ENCOUNTER — Telehealth (INDEPENDENT_AMBULATORY_CARE_PROVIDER_SITE_OTHER): Payer: Self-pay

## 2019-06-15 ENCOUNTER — Other Ambulatory Visit (INDEPENDENT_AMBULATORY_CARE_PROVIDER_SITE_OTHER): Payer: Self-pay | Admitting: Nurse Practitioner

## 2019-06-15 MED ORDER — TRAMADOL HCL 50 MG PO TABS: ORAL_TABLET | ORAL | 0 refills | Status: DC

## 2019-06-15 NOTE — Telephone Encounter (Signed)
Patient has been made aware.

## 2019-06-15 NOTE — Telephone Encounter (Signed)
I have sent in an Rx to her [facility] listed

## 2019-06-21 ENCOUNTER — Telehealth (INDEPENDENT_AMBULATORY_CARE_PROVIDER_SITE_OTHER): Payer: Self-pay

## 2019-06-21 NOTE — Telephone Encounter (Signed)
Home health nurse left a message stating that she has been applying Aquacel gauze wrapping to wound area and she is asking can she start using a border foam dressing after applying the Aquacel so the patient will be able to put the shrinker on. I spoke with Dr Delana Meyer and he is fine with the with nurse starting the new request for wound care. The nurse also stated that she will continue with another 2 weeks of nursing visits and Dr Delana Meyer has been informed with information.

## 2019-06-28 ENCOUNTER — Telehealth: Payer: Self-pay | Admitting: Endocrinology

## 2019-06-28 ENCOUNTER — Other Ambulatory Visit: Payer: Self-pay

## 2019-06-28 DIAGNOSIS — IMO0002 Reserved for concepts with insufficient information to code with codable children: Secondary | ICD-10-CM

## 2019-06-28 DIAGNOSIS — E1165 Type 2 diabetes mellitus with hyperglycemia: Secondary | ICD-10-CM

## 2019-06-28 DIAGNOSIS — E1142 Type 2 diabetes mellitus with diabetic polyneuropathy: Secondary | ICD-10-CM

## 2019-06-28 MED ORDER — FREESTYLE LIBRE 14 DAY SENSOR MISC: 1.0000 | 3 refills | Status: DC

## 2019-06-28 MED ORDER — FREESTYLE LIBRE 14 DAY READER DEVI: 1.0000 | 0 refills | Status: DC

## 2019-06-28 NOTE — Telephone Encounter (Signed)
Called pt to inform that this request would need to be made directly with [place] rather than our office. Pt states she would prefer to send Rx to [facility] in [place]. Rx sent as requested.  E-Prescribing Status: Receipt confirmed by pharmacy (06/28/2019 12:47 PM EDT)

## 2019-06-28 NOTE — Telephone Encounter (Signed)
MEDICATION: Continuous Blood Gluc Sensor (FREESTYLE LIBRE 14 DAY SENSOR) MISC  PHARMACY:  [facility]  IS THIS A 90 DAY SUPPLY :   IS PATIENT OUT OF MEDICATION:   IF NOT; HOW MUCH IS LEFT:   LAST APPOINTMENT DATE: @7 /30/2020  NEXT APPOINTMENT DATE:@10 /06/2019  DO WE HAVE YOUR PERMISSION TO LEAVE A DETAILED MESSAGE:  OTHER COMMENTS:  [facility] does carry the RX  **Let patient know to contact pharmacy at the end of the day to make sure medication is ready. **  ** Please notify patient to allow 48-72 hours to process**  **Encourage patient to contact the pharmacy for refills or they can request refills through [facility]**

## 2019-07-05 ENCOUNTER — Encounter (INDEPENDENT_AMBULATORY_CARE_PROVIDER_SITE_OTHER): Payer: Self-pay | Admitting: Vascular Surgery

## 2019-07-05 ENCOUNTER — Ambulatory Visit (INDEPENDENT_AMBULATORY_CARE_PROVIDER_SITE_OTHER): Payer: Medicare HMO | Admitting: Vascular Surgery

## 2019-07-05 ENCOUNTER — Other Ambulatory Visit: Payer: Self-pay

## 2019-07-05 VITALS — BP 149/85 | HR 64 | Resp 10 | Ht 68.0 in | Wt 279.0 lb

## 2019-07-05 DIAGNOSIS — M14672 Charcot's joint, left ankle and foot: Secondary | ICD-10-CM

## 2019-07-05 DIAGNOSIS — Z89512 Acquired absence of left leg below knee: Secondary | ICD-10-CM

## 2019-07-05 NOTE — Progress Notes (Signed)
  Patient ID: Rachel Holmes, female   DOB: 09/02/1961, 58 y.o.   MRN: [id number]  Chief Complaint  Patient presents with  . Follow-up    HPI Rachel Holmes is a 58 y.o. female.   No c/o  Denies pain   Past Medical History:  Diagnosis Date  . Allergic rhinitis   . Anemia   . Anxiety   . Arthritis   . Back pain   . Bradycardia   . Breast mass    Patient can no longer palpate specific masses but showed tech general area of concern  . CHF (congestive heart failure) ([facility])   . CKD (chronic kidney disease)    STAGE 3  . Constipation   . Diabetes mellitus without complication ([facility])   . Diabetic nephropathy ([facility])   . Dyspnea   . GERD (gastroesophageal reflux disease)   . Heart murmur   . HLD (hyperlipidemia)   . Hyperparathyroidism ([facility])   . Hypertension   . Joint pain   . Leg edema   . Legally blind in left eye, as defined in [place]   . Lymphedema   . Obesity   . Onychomycosis     Past Surgical History:  Procedure Laterality Date  . ABDOMINAL HYSTERECTOMY    . AMPUTATION Left 05/05/2019   Procedure: AMPUTATION BELOW KNEE;  Surgeon: Schnier, Gregory G, MD;  Location: [facility] ORS;  Service: Vascular;  Laterality: Left;  . BREAST BIOPSY Left 2014   FNA 12:00 position - Negative  . EYE SURGERY Left 2007   removed a lens, no lens implanted  . IR FLUORO GUIDE CV LINE RIGHT  12/29/2018  . IR REMOVAL TUN CV CATH W/O FL  02/19/2019  . IR US GUIDE VASC ACCESS RIGHT  12/29/2018  . TEE WITHOUT CARDIOVERSION N/A 12/28/2018   Procedure: TRANSESOPHAGEAL ECHOCARDIOGRAM (TEE);  Surgeon: Turner, Traci R, MD;  Location: [facility] ENDOSCOPY;  Service: Cardiovascular;  Laterality: N/A;  . VAGINAL HYSTERECTOMY     abdominal hyst, not a vaginal hyst      Allergies  Allergen Reactions  . Statins Shortness Of Breath and Other (See Comments)    Wheezing, short of breath  . Penicillins Hives and Other (See Comments)    Has patient had a PCN reaction causing immediate rash, facial/tongue/throat  swelling, SOB or lightheadedness with hypotension: No Has patient had a PCN reaction causing severe rash involving mucus membranes or skin necrosis: No Has patient had a PCN reaction that required hospitalization No Has patient had a PCN reaction occurring within the last 10 years: No If all of the above answers are "NO", then may proceed with Cephalosporin use.  . Codeine Nausea Only  . Ibuprofen Other (See Comments)    Reaction:  Raises pts BP  . Percocet [Oxycodone-Acetaminophen] Nausea Only  . Tramadol Nausea Only and Other (See Comments)    Can take if she has eaten. Currently takes with good results   . Vicodin [Hydrocodone-Acetaminophen] Itching and Nausea Only    Current Outpatient Medications  Medication Sig Dispense Refill  . albuterol (PROVENTIL HFA;VENTOLIN HFA) 108 (90 Base) MCG/ACT inhaler Inhale 1-2 puffs into the lungs every 6 (six) hours as needed for wheezing or shortness of breath.    . amLODipine (NORVASC) 10 MG tablet Take 10 mg by mouth daily.    . aspirin EC 81 MG EC tablet Take 1 tablet (81 mg total) by mouth daily.    . clobetasol cream (TEMOVATE) 0.05 % Apply 1 application topically 2 (  two) times daily.    . cloNIDine (CATAPRES) 0.3 MG tablet Take 0.3 mg by mouth 2 (two) times daily.    [name] docusate sodium (COLACE) 100 MG capsule Take 1 capsule (100 mg total) by mouth 2 (two) times daily. 10 capsule 0  . Dulaglutide (TRULICITY) 1.5 VQ/2.5ZD SOPN Inject 1.5 mg into the skin once a week. (Patient taking differently: Inject 1.5 mg into the skin once a week. ) 4 pen 11  . DULoxetine (CYMBALTA) 30 MG capsule Take 30 mg by mouth daily.     . fluticasone (FLONASE) 50 MCG/ACT nasal spray Place 1 spray into both nostrils daily.     [name] gabapentin (NEURONTIN) 600 MG tablet Take 600 mg by mouth at bedtime.    [name] glucosamine-chondroitin 500-400 MG tablet Take 1 tablet by mouth 2 (two) times daily.    . insulin aspart (NOVOLOG FLEXPEN) 100 UNIT/ML FlexPen 3 times a day (just  before each meal), 13-17-15 units. 25 pen 3  . isosorbide mononitrate (IMDUR) 30 MG 24 hr tablet Take 1 tablet (30 mg total) by mouth 2 (two) times daily. 180 tablet 3  . Multiple Vitamin (MULTIVITAMIN WITH MINERALS) TABS tablet Take 1 tablet by mouth daily.    [name] omeprazole (PRILOSEC) 40 MG capsule Take 40 mg by mouth 2 (two) times daily.    . ondansetron (ZOFRAN) 4 MG tablet Take 4 mg by mouth every 8 (eight) hours as needed for nausea or vomiting.    . sodium bicarbonate 650 MG tablet Take 1 tablet (650 mg total) by mouth 2 (two) times daily. 60 tablet 0  . torsemide (DEMADEX) 20 MG tablet Take 1 tablet (20 mg total) by mouth 2 (two) times daily. Take an extra 54m tablet daily prn in the morning for swelling or sob.    . traMADol (ULTRAM) 50 MG tablet One to Two Tabs Every Six Hours As Needed For Pain 50 tablet 0  . vitamin B-12 (CYANOCOBALAMIN) 1000 MCG tablet Take 1,000 mcg by mouth daily.    . vitamin C (VITAMIN C) 250 MG tablet Take 1 tablet (250 mg total) by mouth 2 (two) times daily.    . Blood Glucose Monitoring Suppl (ONE TOUCH ULTRA 2) w/Device KIT 1 Device by Does not apply route daily. 1 each 0  . Continuous Blood Gluc Receiver (FREESTYLE LIBRE 14 DAY READER) DEVI 1 each by Does not apply route See admin instructions. For continuous glucose monitoring; E11.42 1 each 0  . Continuous Blood Gluc Sensor (FREESTYLE LIBRE 14 DAY SENSOR) MISC 1 Device by Does not apply route every 14 (fourteen) days. 6 each 3  . Ensure Max Protein (ENSURE MAX PROTEIN) LIQD Take 330 mLs (11 oz total) by mouth 2 (two) times daily.    .Marland Kitchenglucose blood (ONETOUCH VERIO) test strip 1 each by Other route 2 (two) times a day. Use to monitor glucose levels BID; E11.42 100 each 3  . Insulin Pen Needle (BD PEN NEEDLE MICRO U/F) 32G X 6 MM MISC 1 each by Does not apply route 3 (three) times daily. 100 each 3  . Lancets (ONETOUCH ULTRASOFT) lancets Used to check blood sugars four times daily. 200 each 12   No current  facility-administered medications for this visit.         Physical Exam BP (!) 149/85 (BP Location: Right Wrist, Patient Position: Sitting, Cuff Size: Normal)   Pulse 64   Resp 10   Ht 5' 8" (1.727 m)   Wt 279 lb (126.6 kg)  BMI 42.42 kg/m  Gen:  WD/WN, NAD in a wheelchair Skin: incision is now closed;  Right foot with 2+ edema     Assessment/Plan: 1. Charcot's joint of left foot OK to move forward with prosthesis  2. Hx of BKA, left ([place]) OK to move forward with prosthesis       [name] 07/05/2019, 3:33 PM   This note was created with Dragon medical transcription system.  Any errors from dictation are unintentional.

## 2019-07-13 ENCOUNTER — Encounter: Payer: Self-pay | Admitting: Student in an Organized Health Care Education/Training Program

## 2019-07-13 ENCOUNTER — Ambulatory Visit
Payer: Medicare HMO | Attending: Student in an Organized Health Care Education/Training Program | Admitting: Student in an Organized Health Care Education/Training Program

## 2019-07-13 ENCOUNTER — Other Ambulatory Visit: Payer: Self-pay

## 2019-07-13 VITALS — BP 146/76 | HR 65 | Temp 98.2°F | Resp 18 | Ht 68.0 in | Wt 280.0 lb

## 2019-07-13 DIAGNOSIS — E1165 Type 2 diabetes mellitus with hyperglycemia: Secondary | ICD-10-CM | POA: Insufficient documentation

## 2019-07-13 DIAGNOSIS — G8929 Other chronic pain: Secondary | ICD-10-CM | POA: Diagnosis present

## 2019-07-13 DIAGNOSIS — I739 Peripheral vascular disease, unspecified: Secondary | ICD-10-CM | POA: Diagnosis present

## 2019-07-13 DIAGNOSIS — M1711 Unilateral primary osteoarthritis, right knee: Secondary | ICD-10-CM | POA: Insufficient documentation

## 2019-07-13 DIAGNOSIS — N184 Chronic kidney disease, stage 4 (severe): Secondary | ICD-10-CM | POA: Diagnosis present

## 2019-07-13 DIAGNOSIS — IMO0002 Reserved for concepts with insufficient information to code with codable children: Secondary | ICD-10-CM

## 2019-07-13 DIAGNOSIS — Z89512 Acquired absence of left leg below knee: Secondary | ICD-10-CM | POA: Diagnosis not present

## 2019-07-13 DIAGNOSIS — M25512 Pain in left shoulder: Secondary | ICD-10-CM | POA: Insufficient documentation

## 2019-07-13 DIAGNOSIS — E1142 Type 2 diabetes mellitus with diabetic polyneuropathy: Secondary | ICD-10-CM | POA: Insufficient documentation

## 2019-07-13 DIAGNOSIS — M25561 Pain in right knee: Secondary | ICD-10-CM | POA: Diagnosis not present

## 2019-07-13 DIAGNOSIS — G894 Chronic pain syndrome: Secondary | ICD-10-CM

## 2019-07-13 DIAGNOSIS — Z6841 Body Mass Index (BMI) 40.0 and over, adult: Secondary | ICD-10-CM

## 2019-07-13 NOTE — Progress Notes (Signed)
Safety precautions to be maintained throughout the outpatient stay will include: orient to surroundings, keep bed in low position, maintain call bell within reach at all times, provide assistance with transfer out of bed and ambulation.  

## 2019-07-13 NOTE — Patient Instructions (Signed)
____________________________________________________________________________________________  Preparing for Procedure with Sedation  Procedure appointments are limited to planned procedures: . No Prescription Refills. . No disability issues will be discussed. . No medication changes will be discussed.  Instructions: . Oral Intake: Do not eat or drink anything for at least 8 hours prior to your procedure. . Transportation: Public transportation is not allowed. Bring an adult driver. The driver must be physically present in our waiting room before any procedure can be started. . Physical Assistance: Bring an adult physically capable of assisting you, in the event you need help. This adult should keep you company at home for at least 6 hours after the procedure. . Blood Pressure Medicine: Take your blood pressure medicine with a sip of water the morning of the procedure. . Blood thinners: Notify our staff if you are taking any blood thinners. Depending on which one you take, there will be specific instructions on how and when to stop it. . Diabetics on insulin: Notify the staff so that you can be scheduled 1st case in the morning. If your diabetes requires high dose insulin, take only  of your normal insulin dose the morning of the procedure and notify the staff that you have done so. . Preventing infections: Shower with an antibacterial soap the morning of your procedure. . Build-up your immune system: Take 1000 mg of Vitamin C with every meal (3 times a day) the day prior to your procedure. . Antibiotics: Inform the staff if you have a condition or reason that requires you to take antibiotics before dental procedures. . Pregnancy: If you are pregnant, call and cancel the procedure. . Sickness: If you have a cold, fever, or any active infections, call and cancel the procedure. . Arrival: You must be in the facility at least 30 minutes prior to your scheduled procedure. . Children: Do not bring  children with you. . Dress appropriately: Bring dark clothing that you would not mind if they get stained. . Valuables: Do not bring any jewelry or valuables.  Reasons to call and reschedule or cancel your procedure: (Following these recommendations will minimize the risk of a serious complication.) . Surgeries: Avoid having procedures within 2 weeks of any surgery. (Avoid for 2 weeks before or after any surgery). . Flu Shots: Avoid having procedures within 2 weeks of a flu shots or . (Avoid for 2 weeks before or after immunizations). . Barium: Avoid having a procedure within 7-10 days after having had a radiological study involving the use of radiological contrast. (Myelograms, Barium swallow or enema study). . Heart attacks: Avoid any elective procedures or surgeries for the initial 6 months after a "Myocardial Infarction" (Heart Attack). . Blood thinners: It is imperative that you stop these medications before procedures. Let us know if you if you take any blood thinner.  . Infection: Avoid procedures during or within two weeks of an infection (including chest colds or gastrointestinal problems). Symptoms associated with infections include: Localized redness, fever, chills, night sweats or profuse sweating, burning sensation when voiding, cough, congestion, stuffiness, runny nose, sore throat, diarrhea, nausea, vomiting, cold or Flu symptoms, recent or current infections. It is specially important if the infection is over the area that we intend to treat. . Heart and lung problems: Symptoms that may suggest an active cardiopulmonary problem include: cough, chest pain, breathing difficulties or shortness of breath, dizziness, ankle swelling, uncontrolled high or unusually low blood pressure, and/or palpitations. If you are experiencing any of these symptoms, cancel your procedure and contact   your primary care physician for an evaluation.  Remember:  Regular Business hours are:  Monday to Thursday  8:00 AM to 4:00 PM  Provider's Schedule: Milinda Pointer, MD:  Procedure days: Tuesday and Thursday 7:30 AM to 4:00 PM  Gillis Santa, MD:  Procedure days: Monday and Wednesday 7:30 AM to 4:00 PM ____________________________________________________________________________________________  Knee Injection A knee injection is a procedure to get medicine into your knee joint to relieve the pain, swelling, and stiffness of arthritis. Your health care provider uses a needle to inject medicine, which may also help to lubricate and cushion your knee joint. You may need more than one injection. Tell a health care provider about:  Any allergies you have.  All medicines you are taking, including vitamins, herbs, eye drops, creams, and over-the-counter medicines.  Any problems you or family members have had with anesthetic medicines.  Any blood disorders you have.  Any surgeries you have had.  Any medical conditions you have.  Whether you are pregnant or may be pregnant. What are the risks? Generally, this is a safe procedure. However, problems may occur, including:  Infection.  Bleeding.  Symptoms that get worse.  Damage to the area around your knee.  Allergic reaction to any of the medicines.  Skin reactions from repeated injections. What happens before the procedure?  Ask your health care provider about changing or stopping your regular medicines. This is especially important if you are taking diabetes medicines or blood thinners.  Plan to have someone take you home from the hospital or clinic. What happens during the procedure?   You will sit or lie down in a position for your knee to be treated.  The skin over your kneecap will be cleaned with a germ-killing soap.  You will be given a medicine that numbs the area (local anesthetic). You may feel some stinging.  The medicine will be injected into your knee. The needle is carefully placed between your kneecap and your  knee. The medicine is injected into the joint space.  The needle will be removed at the end of the procedure.  A bandage (dressing) may be placed over the injection site. The procedure may vary among health care providers and hospitals. What can I expect after the procedure?  Your blood pressure, heart rate, breathing rate, and blood oxygen level will be monitored until you leave the hospital or clinic.  You may have to move your knee through its full range of motion. This helps to get all the medicine into your joint space.  You will be watched to make sure that you do not have a reaction to the injected medicine.  You may feel more pain, swelling, and warmth than you did before the injection. This reaction may last about 1-2 days. Follow these instructions at home: Medicines  Take over-the-counter and prescription medicines only as told by your doctor.  Do not drive or use heavy machinery while taking prescription pain medicine.  Do not take medicines such as aspirin and ibuprofen unless your health care provider tells you to take them. Injection site care  Follow instructions from your health care provider about: ? How to take care of your puncture site. ? When and how you should change your dressing. ? When you should remove your dressing.  Check your injection area every day for signs of infection. Check for: ? More redness, swelling, or pain after 2 days. ? Fluid or blood. ? Pus or a bad smell. ? Warmth. Managing pain, stiffness,  and swelling   If directed, put ice on the injection area: ? Put ice in a plastic bag. ? Place a towel between your skin and the bag. ? Leave the ice on for 20 minutes, 2-3 times per day.  Do not apply heat to your knee.  Raise (elevate) the injection area above the level of your heart while you are sitting or lying down. General instructions  If you were given a dressing, keep it dry until your health care provider says it can be  removed. Ask your health care provider when you can start showering or taking a bath.  Avoid strenuous activities for as long as directed by your health care provider. Ask your health care provider when you can return to your normal activities.  Keep all follow-up visits as told by your health care provider. This is important. You may need more injections. Contact a health care provider if you have:  A fever.  Warmth in your injection area.  Fluid, blood, or pus coming from your injection site.  Symptoms at your injection site that last longer than 2 days after your procedure. Get help right away if:  Your knee: ? Turns very red. ? Becomes very swollen. ? Is in severe pain. Summary  A knee injection is a procedure to get medicine into your knee joint to relieve the pain, swelling, and stiffness of arthritis.  A needle is carefully placed between your kneecap and your knee to inject medicine into the joint space.  Before the procedure, ask your health care provider about changing or stopping your regular medicines, especially if you are taking diabetes medicines or blood thinners.  Contact your health care provider if you have any problems or questions after your procedure. This information is not intended to replace advice given to you by your health care provider. Make sure you discuss any questions you have with your health care provider. Document Released: 01/12/2007 Document Revised: 11/10/2017 Document Reviewed: 11/10/2017 [employer] Patient Education  2020 [employer].

## 2019-07-13 NOTE — Progress Notes (Signed)
Patient's Name: Rachel Holmes  MRN: [id number]  Referring Provider: Lattie Holmes, [place]*  DOB: 1961-02-05  PCP: Rachel Peach, MD  DOS: 07/13/2019  Note by: Rachel Santa, MD  Service setting: Ambulatory outpatient  Specialty: Interventional Pain Management  Location: [facility] (AMB) Pain Management Facility  Visit type: Initial Patient Evaluation  Patient type: New Patient   Primary Reason(s) for Visit: Encounter for initial evaluation of one or more chronic problems (new to examiner) potentially causing chronic pain, and posing a threat to normal musculoskeletal function. (Level of risk: High) CC: Knee Pain (right) and Hand Pain (bilateral)  HPI  Rachel Holmes is a 58 y.o. year old, female patient, who comes today to see [name] for the first time for an initial evaluation of her chronic pain. She has Charcot's joint of left foot; Chronic pain of right knee; Obesity; Uncontrolled type 2 diabetes mellitus with polyneuropathy ([place]); Primary osteoarthritis of right knee; Other fatigue; Essential hypertension; Vitamin D deficiency; Congestive heart failure ([place]); Malignant hypertension (arteriolar nephrosclerosis), stage 1-4 or unspecified chronic kidney disease; Stage 4 chronic kidney disease ([place]); Morbid obesity with BMI of 45.0-49.9, adult ([place]); Excessive daytime sleepiness; Acute cystitis without hematuria; Acute renal failure superimposed on stage 3 chronic kidney disease ([place]); Allergic rhinitis; Anemia in other chronic diseases classified elsewhere; Anxiety; Bradycardia; Closed nondisplaced fracture of right patella; Colitis; Gastro-esophageal reflux disease without esophagitis; HTN (hypertension), malignant; Hyperparathyroidism, unspecified ([place]); Hyponatremia with extracellular fluid depletion; Lymphedema; Nausea with vomiting, unspecified; Onychomycosis; Bacteremia due to Streptococcus; Aortic valve endocarditis; Ulcer of left ankle ([place]); Chronic diastolic CHF (congestive heart failure) ([place]); Leg  edema; Acute bacterial endocarditis; Chest pain of uncertain etiology; Chronic osteomyelitis of ankle and foot, left ([place]); Hx of BKA, left ([place]); Ankle deformity, left; Diabetic polyneuropathy associated with type 2 diabetes mellitus ([place]); Diabetic ulcer of left ankle associated with diabetes mellitus due to underlying condition, with fat layer exposed ([place]); PVD (peripheral vascular disease) ([place]); Wound dehiscence; and Chronic pain syndrome on their problem list. Today she comes in for evaluation of her Knee Pain (right) and Hand Pain (bilateral)  Pain Assessment: Location: Right Knee Radiating: radiates around to back of leg Onset: More than a month ago(3 years) Duration: Chronic pain Quality: Aching Severity: 7 /10 (subjective, self-reported pain score)  Note: Reported level is compatible with observation.                         When using our objective Pain Scale, levels between 6 and 10/10 are said to belong in an emergency room, as it progressively worsens from a 6/10, described as severely limiting, requiring emergency care not usually available at an outpatient pain management facility. At a 6/10 level, communication becomes difficult and requires great effort. Assistance to reach the emergency department may be required. Facial flushing and profuse sweating along with potentially dangerous increases in heart rate and blood pressure will be evident. Effect on ADL: unable to stand and walk Timing: Constant Modifying factors: Tramadol BP: (!) 146/76  HR: 65  Onset and Duration: Gradual Cause of pain: diabetic neuropathy Severity: Getting worse Timing: Not influenced by the time of the day Aggravating Factors: Walking, Walking uphill and Walking downhill Alleviating Factors: Medications Associated Problems: Swelling and Pain that wakes patient up Quality of Pain: Aching and Uncomfortable Previous Examinations or Tests: X-rays Previous Treatments: Trigger point injections  The  patient comes into the clinics today for the first time for a chronic pain management evaluation.  Patient is a pleasant 58 year old female with a history diabetes dependent on insulin with associated diabetic neuropathy, nephropathy and retinopathy.  Patient has blindness of her left eye.  She is status post left BKA in July 2020.  She is in the process of being fitted with a prosthesis.  She states that prior to this, she battled chronic ulcers and associated osteomyelitis compounded by diabetes and peripheral vascular disease.  Patient denies any surgery of her right knee.  She states that she has pain that is described as throbbing in her right knee.  Patient states that she has undergone Visco supplementation approximately 2 years ago.  She states that these were helpful.  She is interested in repeating.  Regards to medication management, patient is on gabapentin 600 mg nightly, Cymbalta 30 mg daily.  NSAIDs are contraindicated.  Do not recommend muscle relaxers at this time.  Patient does utilize tramadol 100 mg nightly and does find benefit with this.  Patient does have stage III chronic kidney disease.  This is managed in [place].  Patient would like to transfer her care closer to [facility].  Historic Controlled Substance Pharmacotherapy Review   06/15/2019  2   06/15/2019  Tramadol Hcl 50 MG Tablet  50.00 7 Fa Bro   [id number]   [place] ([id number])   0  35.71 MME  Medicare   [place]    Medications: The patient did not bring the medication(s) to the appointment, as requested in our "New Patient Package" Pharmacodynamics: Desired effects: Analgesia: The patient reports 50% benefit. Reported improvement in function: The patient reports medication allows her to accomplish basic ADLs. Clinically meaningful improvement in function (CMIF): Sustained CMIF goals met Perceived effectiveness: Described as relatively effective, allowing for increase in activities of daily living (ADL) Undesirable  effects: Side-effects or Adverse reactions: None reported Historical Monitoring: The patient  reports no history of drug use. List of all UDS Test(s): No results found for: MDMA, COCAINSCRNUR, [place], [place], CANNABQUANT, THCU, [place] List of other Serum/Urine Drug Screening Test(s):  No results found for: AMPHSCRSER, BARBSCRSER, BENZOSCRSER, COCAINSCRSER, COCAINSCRNUR, PCPSCRSER, PCPQUANT, THCSCRSER, THCU, CANNABQUANT, OPIATESCRSER, OXYSCRSER, PROPOXSCRSER, ETH Historical Background Evaluation: [place] PMP: PDMP reviewed during this encounter. Six (6) year initial data search conducted.             [place] Department of public safety, offender search: Editor, commissioning Information) Non-contributory Risk Assessment Profile: Aberrant behavior: None observed or detected today Risk factors for fatal opioid overdose: None identified today Fatal overdose hazard ratio (HR): Calculation deferred Non-fatal overdose hazard ratio (HR): Calculation deferred Risk of opioid abuse or dependence: 0.7-3.0% with doses ? 36 MME/day and 6.1-26% with doses ? 120 MME/day. Substance use disorder (SUD) risk level: See below Personal History of Substance Abuse (SUD-Substance use disorder):  Alcohol: Negative  Illegal Drugs: Negative  Rx Drugs:    ORT Risk Level calculation: Low Risk Opioid Risk Tool - 07/13/19 0829      Family History of Substance Abuse   Alcohol  Positive Female   brother   Illegal Drugs  Negative    Rx Drugs  Negative      Personal History of Substance Abuse   Alcohol  Negative    Illegal Drugs  Negative      Age   Age between 6-45 years   No      Psychological Disease   Psychological Disease  Negative    Depression  Negative      Total Score   Opioid Risk Tool Scoring  1  Opioid Risk Interpretation  Low Risk      ORT Scoring interpretation table:  Score <3 = Low Risk for SUD  Score between 4-7 = Moderate Risk for SUD  Score >8 = High Risk for Opioid Abuse   PHQ-2 Depression Scale:   Total score: 0  PHQ-2 Scoring interpretation table: (Score and probability of major depressive disorder)  Score 0 = No depression  Score 1 = 15.4% Probability  Score 2 = 21.1% Probability  Score 3 = 38.4% Probability  Score 4 = 45.5% Probability  Score 5 = 56.4% Probability  Score 6 = 78.6% Probability   PHQ-9 Depression Scale:  Total score: 0  PHQ-9 Scoring interpretation table:  Score 0-4 = No depression  Score 5-9 = Mild depression  Score 10-14 = Moderate depression  Score 15-19 = Moderately severe depression  Score 20-27 = Severe depression (2.4 times higher risk of SUD and 2.89 times higher risk of overuse)   Pharmacologic Plan: As per protocol, I have not taken over any controlled substance management, pending the results of ordered tests and/or consults.            Initial impression: Pending review of available data and ordered tests.  Meds   Current Outpatient Medications:  .  albuterol (PROVENTIL HFA;VENTOLIN HFA) 108 (90 Base) MCG/ACT inhaler, Inhale 1-2 puffs into the lungs every 6 (six) hours as needed for wheezing or shortness of breath., Disp: , Rfl:  .  amLODipine (NORVASC) 10 MG tablet, Take 10 mg by mouth daily., Disp: , Rfl:  .  aspirin EC 81 MG EC tablet, Take 1 tablet (81 mg total) by mouth daily., Disp:  , Rfl:  .  Blood Glucose Monitoring Suppl (ONE TOUCH ULTRA 2) w/Device KIT, 1 Device by Does not apply route daily., Disp: 1 each, Rfl: 0 .  clobetasol cream (TEMOVATE) 0.17 %, Apply 1 application topically 2 (two) times daily., Disp: , Rfl:  .  cloNIDine (CATAPRES) 0.3 MG tablet, Take 0.3 mg by mouth 2 (two) times daily., Disp: , Rfl:  .  Continuous Blood Gluc Receiver (FREESTYLE LIBRE 14 DAY READER) DEVI, 1 each by Does not apply route See admin instructions. For continuous glucose monitoring; E11.42, Disp: 1 each, Rfl: 0 .  Continuous Blood Gluc Sensor (FREESTYLE LIBRE 14 DAY SENSOR) MISC, 1 Device by Does not apply route every 14 (fourteen) days., Disp: 6  each, Rfl: 3 .  docusate sodium (COLACE) 100 MG capsule, Take 1 capsule (100 mg total) by mouth 2 (two) times daily., Disp: 10 capsule, Rfl: 0 .  Dulaglutide (TRULICITY) 1.5 BL/3.9QZ SOPN, Inject 1.5 mg into the skin once a week. (Patient taking differently: Inject 1.5 mg into the skin once a week. ), Disp: 4 pen, Rfl: 11 .  DULoxetine (CYMBALTA) 30 MG capsule, Take 30 mg by mouth daily. , Disp: , Rfl:  .  Ensure Max Protein (ENSURE MAX PROTEIN) LIQD, Take 330 mLs (11 oz total) by mouth 2 (two) times daily., Disp:  , Rfl:  .  fluticasone (FLONASE) 50 MCG/ACT nasal spray, Place 1 spray into both nostrils daily. , Disp: , Rfl:  .  gabapentin (NEURONTIN) 600 MG tablet, Take 600 mg by mouth at bedtime., Disp: , Rfl:  .  glucosamine-chondroitin 500-400 MG tablet, Take 1 tablet by mouth 2 (two) times daily., Disp: , Rfl:  .  glucose blood (ONETOUCH VERIO) test strip, 1 each by Other route 2 (two) times a day. Use to monitor glucose levels BID; E11.42, Disp: 100  each, Rfl: 3 .  insulin aspart (NOVOLOG FLEXPEN) 100 UNIT/ML FlexPen, 3 times a day (just before each meal), 13-17-15 units., Disp: 25 pen, Rfl: 3 .  Insulin Pen Needle (BD PEN NEEDLE MICRO U/F) 32G X 6 MM MISC, 1 each by Does not apply route 3 (three) times daily., Disp: 100 each, Rfl: 3 .  isosorbide mononitrate (IMDUR) 30 MG 24 hr tablet, Take 1 tablet (30 mg total) by mouth 2 (two) times daily., Disp: 180 tablet, Rfl: 3 .  Lancets (ONETOUCH ULTRASOFT) lancets, Used to check blood sugars four times daily., Disp: 200 each, Rfl: 12 .  Multiple Vitamin (MULTIVITAMIN WITH MINERALS) TABS tablet, Take 1 tablet by mouth daily., Disp: , Rfl:  .  omeprazole (PRILOSEC) 40 MG capsule, Take 40 mg by mouth 2 (two) times daily., Disp: , Rfl:  .  ondansetron (ZOFRAN) 4 MG tablet, Take 4 mg by mouth every 8 (eight) hours as needed for nausea or vomiting., Disp: , Rfl:  .  sodium bicarbonate 650 MG tablet, Take 1 tablet (650 mg total) by mouth 2 (two) times  daily., Disp: 60 tablet, Rfl: 0 .  torsemide (DEMADEX) 20 MG tablet, Take 1 tablet (20 mg total) by mouth 2 (two) times daily. Take an extra 10m tablet daily prn in the morning for swelling or sob., Disp: , Rfl:  .  traMADol (ULTRAM) 50 MG tablet, One to Two Tabs Every Six Hours As Needed For Pain, Disp: 50 tablet, Rfl: 0 .  vitamin B-12 (CYANOCOBALAMIN) 1000 MCG tablet, Take 1,000 mcg by mouth daily., Disp: , Rfl:  .  vitamin C (VITAMIN C) 250 MG tablet, Take 1 tablet (250 mg total) by mouth 2 (two) times daily., Disp:  , Rfl:   Imaging Review   Results for orders placed during the hospital encounter of 12/02/18  DG Knee 1-2 Views Right   Narrative CLINICAL DATA:  RIGHT knee pain and swelling for 2 weeks post fall  EXAM: RIGHT KNEE - 1-2 VIEW  COMPARISON:  12/31/2017  FINDINGS: Osseous demineralization.  Tricompartmental osteoarthritic changes with joint space narrowing and spur formation.  Mild subchondral cyst formation at medial compartment, which demonstrates the greatest degree of narrowing.  No acute fracture, dislocation, or bone destruction.  No knee joint effusion.  Scattered atherosclerotic calcifications.  IMPRESSION: Tricompartmental osteoarthritic changes of the RIGHT knee greatest at medial compartment.   Electronically Signed   By: MLavonia DanaM.D.   On: 12/02/2018 16:18     Knee-R DG 4 views:  Results for orders placed during the hospital encounter of 12/31/17  DG Knee Complete 4 Views Right   Narrative CLINICAL DATA:  Right knee pain  EXAM: RIGHT KNEE - COMPLETE 4+ VIEW  COMPARISON:  05/29/2015  FINDINGS: No acute displaced fracture or malalignment. Advanced arthritis of the medial compartment with bone-on-bone appearance, sub chondral sclerosis, and osteophyte. Mild degenerative changes of the lateral compartment with mild narrowing and bony spurring. Mild patellofemoral degenerative changes. Small moderate joint effusion. Vascular  calcifications.  IMPRESSION: Tricompartment arthritis with advanced arthritic changes of the medial compartment of the knee. No acute osseous abnormality. Small moderate joint effusion.   Electronically Signed   By: KDonavan FoilM.D.   On: 12/31/2017 14:58    Results for orders placed in visit on 09/09/18  DG Foot Complete Left   Narrative Please see detailed radiograph report in office note.   Complexity Note: Imaging results reviewed. Results shared with Ms. Finklea, using Layman's terms.  ROS  Cardiovascular: High blood pressure Pulmonary or Respiratory: No reported pulmonary signs or symptoms such as wheezing and difficulty taking a deep full breath (Asthma), difficulty blowing air out (Emphysema), coughing up mucus (Bronchitis), persistent dry cough, or temporary stoppage of breathing during sleep Neurological: No reported neurological signs or symptoms such as seizures, abnormal skin sensations, urinary and/or fecal incontinence, being born with an abnormal open spine and/or a tethered spinal cord Review of Past Neurological Studies:  Results for orders placed or performed during the hospital encounter of 02/03/17  CT Head Wo Contrast   Narrative   CLINICAL DATA:  Patient woke up with vertigo and dizziness along with RIGHT-sided weakness. Elevated blood pressure. History of hypertension and diabetes.  EXAM: CT HEAD WITHOUT CONTRAST  TECHNIQUE: Contiguous axial images were obtained from the base of the skull through the vertex without intravenous contrast.  COMPARISON:  CT head 10/21/2013.  FINDINGS: Brain: No evidence for acute infarction, hemorrhage, mass lesion, hydrocephalus, or extra-axial fluid. Normal for age cerebral volume. Patchy areas of hypoattenuation of white matter are noted, suggesting chronic microvascular ischemic change.  Vascular: No intracranial hyperdense vessel. Moderately advanced calcifications of the BILATERAL  internal carotid artery cavernous segments and BILATERAL distal vertebral artery segments.  Skull: Normal. Negative for fracture or focal lesion.  Sinuses/Orbits: No acute finding.  Other: None.  Compared with priors, slight progression of small vessel disease is suspected.  IMPRESSION: Mild small vessel disease.  Calcific intracranial atherosclerosis.  No acute intracranial findings are evident. If further investigation desired, and no contraindications, MRI of the brain recommended for further evaluation.   Electronically Signed   By: Staci Righter M.D.   On: 02/03/2017 10:37    Psychological-Psychiatric: No reported psychological or psychiatric signs or symptoms such as difficulty sleeping, anxiety, depression, delusions or hallucinations (schizophrenial), mood swings (bipolar disorders) or suicidal ideations or attempts Gastrointestinal: No reported gastrointestinal signs or symptoms such as vomiting or evacuating blood, reflux, heartburn, alternating episodes of diarrhea and constipation, inflamed or scarred liver, or pancreas or irrregular and/or infrequent bowel movements Genitourinary: Kidney disease Hematological: No reported hematological signs or symptoms such as prolonged bleeding, low or poor functioning platelets, bruising or bleeding easily, hereditary bleeding problems, low energy levels due to low hemoglobin or being anemic Endocrine: High blood sugar requiring insulin (IDDM) Rheumatologic: No reported rheumatological signs and symptoms such as fatigue, joint pain, tenderness, swelling, redness, heat, stiffness, decreased range of motion, with or without associated rash Musculoskeletal: Negative for myasthenia gravis, muscular dystrophy, multiple sclerosis or malignant hyperthermia Work History: Disabled since 2007  Allergies  Ms. Underwood is allergic to statins; penicillins; codeine; ibuprofen; percocet [oxycodone-acetaminophen]; tramadol; and vicodin  [hydrocodone-acetaminophen].  Laboratory Chemistry Profile   Screening Lab Results  Component Value Date   SARSCOV2NAA NEGATIVE 05/29/2019   COVIDSOURCE NASOPHARYNGEAL 04/30/2019   MRSAPCR NEGATIVE 05/29/2019   HIV Non Reactive 12/24/2018    Inflammation (CRP: Acute Phase) (ESR: Chronic Phase) Lab Results  Component Value Date   CRP 29.7 (H) 12/26/2018   ESRSEDRATE 102 (H) 12/26/2018   LATICACIDVEN 2.1 ([place]) 12/23/2018                         Rheumatology No results found for: RF, ANA, LABURIC, URICUR, LYMEIGGIGMAB, LYMEABIGMQN, HLAB27                      Renal Lab Results  Component Value Date   BUN 66 (H) 05/28/2019   CREATININE  3.25 (H) 05/28/2019   BCR 17 06/25/2018   GFRAA 17 (L) 05/28/2019   GFRNONAA 15 (L) 05/28/2019                             Hepatic Lab Results  Component Value Date   AST 27 05/28/2019   ALT 23 05/28/2019   ALBUMIN 4.0 05/28/2019   ALKPHOS 144 (H) 05/28/2019   LIPASE 103 11/19/2013                        Electrolytes Lab Results  Component Value Date   NA 136 05/28/2019   K 4.3 05/28/2019   CL 103 05/28/2019   CALCIUM 8.8 (L) 05/28/2019   MG 2.4 05/12/2019   PHOS 4.5 09/09/2014                        Neuropathy Lab Results  Component Value Date   HGBA1C 7.3 (H) 05/06/2019   HIV Non Reactive 12/24/2018                         Bone Lab Results  Component Value Date   VD25OH 28.5 (L) 06/25/2018                         Coagulation Lab Results  Component Value Date   INR 0.8 05/29/2019   LABPROT 11.3 (L) 05/29/2019   APTT 31 04/30/2019   PLT 243 05/28/2019                        Cardiovascular Lab Results  Component Value Date   BNP 601.9 (H) 12/28/2018   CKTOTAL 180 10/21/2013   CKMB 1.5 10/21/2013   TROPONINI < 0.02 11/19/2013   HGB 9.4 (L) 05/28/2019   HCT 29.2 (L) 05/28/2019                         ID Lab Results  Component Value Date   HIV Non Reactive 12/24/2018   [place] NEGATIVE 05/29/2019    MRSAPCR NEGATIVE 05/29/2019    Cancer No results found for: CEA, CA125, LABCA2                      Endocrine Lab Results  Component Value Date   TSH 1.701 05/28/2019   FREET4 1.10 03/11/2018                        Note: Lab results reviewed.  PFSH  Drug: Ms. Keast  reports no history of drug use. Alcohol:  reports no history of alcohol use. Tobacco:  reports that she has never smoked. She has never used smokeless tobacco. Medical:  has a past medical history of Allergic rhinitis, Allergy, Anemia, Anxiety, Arthritis, Back pain, Bradycardia, Breast mass, CHF (congestive heart failure) ([place]), CKD (chronic kidney disease), Constipation, Diabetes mellitus without complication ([place]), Diabetic nephropathy ([place]), Dyspnea, GERD (gastroesophageal reflux disease), Heart murmur, HLD (hyperlipidemia), Hyperparathyroidism ([place]), Hypertension, Joint pain, Leg edema, Legally blind in left eye, as defined in [place], Lymphedema, Obesity, and Onychomycosis. Family: family history includes Breast cancer (age of onset: 70) in her sister; Diabetes in her mother and sister; Eating disorder in her mother; Hyperlipidemia in her mother; Hypertension in her mother; Obesity in her mother.  Past  Surgical History:  Procedure Laterality Date  . ABDOMINAL HYSTERECTOMY    . AMPUTATION Left 05/05/2019   Procedure: AMPUTATION BELOW KNEE;  Surgeon: Katha Cabal, MD;  Location: [facility] ORS;  Service: Vascular;  Laterality: Left;  . BREAST BIOPSY Left 2014   FNA 12:00 position - Negative  . EYE SURGERY Left 2007   removed a lens, no lens implanted  . IR FLUORO GUIDE CV LINE RIGHT  12/29/2018  . IR REMOVAL TUN CV CATH W/O FL  02/19/2019  . IR US GUIDE VASC ACCESS RIGHT  12/29/2018  . TEE WITHOUT CARDIOVERSION N/A 12/28/2018   Procedure: TRANSESOPHAGEAL ECHOCARDIOGRAM (TEE);  Surgeon: Sueanne Margarita, MD;  Location: [facility] ENDOSCOPY;  Service: Cardiovascular;  Laterality: N/A;  . VAGINAL HYSTERECTOMY     abdominal hyst,  not a vaginal hyst   Active Ambulatory Problems    Diagnosis Date Noted  . Charcot's joint of left foot 08/29/2017  . Chronic pain of right knee 10/02/2017  . Obesity 10/02/2017  . Uncontrolled type 2 diabetes mellitus with polyneuropathy ([place]) 10/03/2017  . Primary osteoarthritis of right knee 01/06/2018  . Other fatigue 03/11/2018  . Essential hypertension 03/11/2018  . Vitamin D deficiency 03/11/2018  . Congestive heart failure ([place]) 03/11/2018  . Malignant hypertension (arteriolar nephrosclerosis), stage 1-4 or unspecified chronic kidney disease 07/15/2018  . Stage 4 chronic kidney disease ([place]) 07/15/2018  . Morbid obesity with BMI of 45.0-49.9, adult ([place]) 07/15/2018  . Excessive daytime sleepiness 07/15/2018  . Acute cystitis without hematuria 08/14/2014  . Acute renal failure superimposed on stage 3 chronic kidney disease ([place]) 06/26/2014  . Allergic rhinitis 02/22/2014  . Anemia in other chronic diseases classified elsewhere 01/27/2013  . Anxiety 11/05/2013  . Bradycardia 05/14/2013  . Closed nondisplaced fracture of right patella 08/23/2015  . Colitis 08/14/2014  . Gastro-esophageal reflux disease without esophagitis 04/05/2013  . HTN (hypertension), malignant 08/14/2014  . Hyperparathyroidism, unspecified ([place]) 02/24/2014  . Hyponatremia with extracellular fluid depletion 02/02/2016  . Lymphedema 09/06/2016  . Nausea with vomiting, unspecified 02/02/2016  . Onychomycosis 02/22/2014  . Bacteremia due to Streptococcus 12/24/2018  . Aortic valve endocarditis   . Ulcer of left ankle ([place]) 04/14/2019  . Chronic diastolic CHF (congestive heart failure) ([place]) 04/19/2019  . Leg edema 04/19/2019  . Acute bacterial endocarditis 04/19/2019  . Chest pain of uncertain etiology 29/56/2130  . Chronic osteomyelitis of ankle and foot, left ([place]) 05/05/2019  . Hx of BKA, left ([place]) 05/24/2019  . Ankle deformity, left 01/21/2019  . Diabetic polyneuropathy associated with type 2  diabetes mellitus ([place]) 01/21/2019  . Diabetic ulcer of left ankle associated with diabetes mellitus due to underlying condition, with fat layer exposed ([place]) 11/09/2018  . PVD (peripheral vascular disease) ([place]) 05/13/2019  . Wound dehiscence 05/29/2019  . Chronic pain syndrome 07/13/2019   Resolved Ambulatory Problems    Diagnosis Date Noted  . Shortness of breath on exertion 03/11/2018  . Cellulitis in diabetic foot ([place]) 12/24/2018  . Streptococcus infection 12/24/2018  . S/P PICC central line placement 02/10/2019   Past Medical History:  Diagnosis Date  . Allergy   . Anemia   . Arthritis   . Back pain   . Breast mass   . CHF (congestive heart failure) ([place])   . CKD (chronic kidney disease)   . Constipation   . Diabetes mellitus without complication ([place])   . Diabetic nephropathy ([place])   . Dyspnea   . GERD (gastroesophageal reflux disease)   . Heart murmur   .  HLD (hyperlipidemia)   . Hyperparathyroidism ([place])   . Hypertension   . Joint pain   . Legally blind in left eye, as defined in [place]    Constitutional Exam  General appearance: alert, cooperative and morbidly obese Vitals:   07/13/19 0804  BP: (!) 146/76  Pulse: 65  Resp: 18  Temp: 98.2 F (36.8 C)  SpO2: 100%  Weight: 280 lb (127 kg)  Height: 5' 8" (1.727 m)   BMI Assessment: Estimated body mass index is 42.57 kg/m as calculated from the following:   Height as of this encounter: 5' 8" (1.727 m).   Weight as of this encounter: 280 lb (127 kg).  BMI interpretation table: BMI level Category Range association with higher incidence of chronic pain  <18 kg/m2 Underweight   18.5-24.9 kg/m2 Ideal body weight   25-29.9 kg/m2 Overweight Increased incidence by 20%  30-34.9 kg/m2 Obese (Class I) Increased incidence by 68%  35-39.9 kg/m2 Severe obesity (Class II) Increased incidence by 136%  >40 kg/m2 Extreme obesity (Class III) Increased incidence by 254%   Patient's current BMI Ideal Body weight  Body mass  index is 42.57 kg/m. Ideal body weight: 63.9 kg (140 lb 14 oz) Adjusted ideal body weight: 89.1 kg (196 lb 8.4 oz)   BMI Readings from Last 4 Encounters:  07/13/19 42.57 kg/m  07/05/19 42.42 kg/m  06/10/19 44.25 kg/m  06/03/19 44.85 kg/m   Wt Readings from Last 4 Encounters:  07/13/19 280 lb (127 kg)  07/05/19 279 lb (126.6 kg)  06/10/19 291 lb (132 kg)  06/03/19 295 lb (133.8 kg)  Psych/Mental status: Alert, oriented x 3 (person, place, & time)       Eyes: PERLA loss of vision in left eye Respiratory: No evidence of acute respiratory distress  Cervical Spine Area Exam  Skin & Axial Inspection: No masses, redness, edema, swelling, or associated skin lesions Alignment: Symmetrical Functional ROM: Decreased ROM      Stability: No instability detected Muscle Tone/Strength: Functionally intact. No obvious neuro-muscular anomalies detected. Sensory (Neurological): Musculoskeletal pain pattern Palpation: No palpable anomalies              Upper Extremity (UE) Exam    Side: Right upper extremity  Side: Left upper extremity  Skin & Extremity Inspection: Skin color, temperature, and hair growth are WNL. No peripheral edema or cyanosis. No masses, redness, swelling, asymmetry, or associated skin lesions. No contractures.  Skin & Extremity Inspection: Skin color, temperature, and hair growth are WNL. No peripheral edema or cyanosis. No masses, redness, swelling, asymmetry, or associated skin lesions. No contractures.  Functional ROM: Unrestricted ROM          Functional ROM: Unrestricted ROM          Muscle Tone/Strength: Functionally intact. No obvious neuro-muscular anomalies detected.  Muscle Tone/Strength: Functionally intact. No obvious neuro-muscular anomalies detected.  Sensory (Neurological): Neuropathic pain pattern          Sensory (Neurological): Neuropathic pain pattern          Palpation: No palpable anomalies              Palpation: No palpable anomalies               Provocative Test(s):  Phalen's test: deferred Tinel's test: deferred Apley's scratch test (touch opposite shoulder):  Action 1 (Across chest): Decreased ROM Action 2 (Overhead): Decreased ROM Action 3 (LB reach): Decreased ROM   Provocative Test(s):  Phalen's test: deferred Tinel's test: deferred Apley's scratch  test (touch opposite shoulder):  Action 1 (Across chest): Decreased ROM Action 2 (Overhead): Decreased ROM Action 3 (LB reach): Decreased ROM    Thoracic Spine Area Exam  Skin & Axial Inspection: No masses, redness, or swelling Alignment: Symmetrical Functional ROM: Unrestricted ROM Stability: No instability detected Muscle Tone/Strength: Functionally intact. No obvious neuro-muscular anomalies detected. Sensory (Neurological): Musculoskeletal pain pattern Muscle strength & Tone: No palpable anomalies  Lumbar Spine Area Exam  Skin & Axial Inspection: No masses, redness, or swelling Alignment: Symmetrical Functional ROM: Decreased ROM       Stability: No instability detected Muscle Tone/Strength: Functionally intact. No obvious neuro-muscular anomalies detected. Sensory (Neurological): Musculoskeletal pain pattern   Gait & Posture Assessment  Ambulation: Limited Gait: Limited. Using assistive device to ambulate Posture: Painful   Lower Extremity Exam    Side: Right lower extremity  Side: Left lower extremity  Stability: No instability observed          Stability: No instability observed          Skin & Extremity Inspection: Edema  Skin & Extremity Inspection: Below knee amputation (BKA)  Functional ROM: Decreased ROM for hip and knee joints          Functional ROM: Decreased ROM for hip joint          Muscle Tone/Strength: Functionally intact. No obvious neuro-muscular anomalies detected.  Muscle Tone/Strength: Deconditioned  Sensory (Neurological): Arthropathic arthralgia        Sensory (Neurological): Neurogenic pain pattern        DTR: Patellar: 1+:  trace Achilles: 0: absent Plantar: deferred today    Palpation: No palpable anomalies     Assessment  Primary Diagnosis & Pertinent Problem List: The primary encounter diagnosis was Primary osteoarthritis of right knee. Diagnoses of Chronic pain of right knee, Hx of BKA, left ([place]), PVD (peripheral vascular disease) ([place]), Stage 4 chronic kidney disease ([place]), Morbid obesity with BMI of 45.0-49.9, adult ([place]), Uncontrolled type 2 diabetes mellitus with polyneuropathy ([place]), and Chronic pain syndrome were also pertinent to this visit.  Visit Diagnosis (New problems to examiner): 1. Primary osteoarthritis of right knee   2. Chronic pain of right knee   3. Hx of BKA, left ([place])   4. PVD (peripheral vascular disease) ([facility])   5. Stage 4 chronic kidney disease ([place])   6. Morbid obesity with BMI of 45.0-49.9, adult ([place])   7. Uncontrolled type 2 diabetes mellitus with polyneuropathy ([place])   8. Chronic pain syndrome   General Recommendations: The pain condition that the patient suffers from is best treated with a multidisciplinary approach that involves an increase in physical activity to prevent de-conditioning and worsening of the pain cycle, as well as psychological counseling (formal and/or informal) to address the co-morbid psychological affects of pain. Treatment will often involve judicious use of pain medications and interventional procedures to decrease the pain, allowing the patient to participate in the physical activity that will ultimately produce long-lasting pain reductions. The goal of the multidisciplinary approach is to return the patient to a higher level of overall function and to restore their ability to perform activities of daily living.  Plan of Care (Initial workup plan)  Note: Ms. Dukes was reminded that as per protocol, today's visit has been an evaluation only. We have not taken over the patient's controlled substance management.  1. Primary osteoarthritis of right  knee -Severe osteoarthritis of the right knee.  Has had intra-articular Visco supplementation over 2 years ago.  Did find this  beneficial.  Discussed repeating.  Risks and benefits reviewed.  Patient like to proceed. -Also consider genicular nerve block in future - KNEE INJECTION; Future  2. Chronic pain of right knee -Series of intra-articular Hyalgan injections as above -Consideration of right genicular nerve block and possible RFA in future  3. Hx of BKA, left ([place]) -Patient experiencing phantom pain as well as stump pain.  Recent BKA in July 2020.  Patient is working with therapy.  Is in the process of getting a prosthesis fitted.  4. PVD (peripheral vascular disease) ([place]) -Continue with specialist  5. Stage 4 chronic kidney disease [facility]) -Patient sees nephrology in [place] and would like to have her care transferred to [facility].  Referral placed below. - Ambulatory referral to Nephrology  6. Morbid obesity with BMI of 45.0-49.9, adult [facility]) -Discussed importance of healthy eating and exercise as much as she can.  7. Uncontrolled type 2 diabetes mellitus with polyneuropathy ([place]) -Continue with diabetic management per PCP  8. Chronic pain syndrome -Right intra-articular Hyalgan, possible right genicular nerve block.  Consideration for tramadol 100 mg nightly. - Compliance Drug Analysis, Ur -Continue gabapentin and Cymbalta as prescribed -Avoid NSAIDs given CKD   Lab Orders     Compliance Drug Analysis, Ur  Referral Orders     Ambulatory referral to Nephrology  Procedure Orders     KNEE INJECTION  Future considerations could also include referral to physical therapy for mirror therapy to help out with phantom pain.  Pharmacological management options:  Opioid Analgesics: The patient was informed that there is no guarantee that she would be a candidate for opioid analgesics. The decision will be made following CDC guidelines. This decision will be based on the results  of diagnostic studies, as well as Ms. Ferran's risk profile.   Membrane stabilizer: Continue Cymbalta and gabapentin  Muscle relaxant: To be determined at a later time  NSAID: Medically contraindicated  Other analgesic(s): To be determined at a later time   Interventional management options: Ms. Spoerl was informed that there is no guarantee that she would be a candidate for interventional therapies. The decision will be based on the results of diagnostic studies, as well as Ms. Hippler's risk profile.  Procedure(s) under consideration:  Right intra-articular Hyalgan Right genicular nerve block   Provider-requested follow-up: Return in about 2 weeks (around 07/27/2019) for Procedure R Hyalgan #1.  Future Appointments  Date Time Provider [place]  08/12/2019  2:00 PM Renato Shin, MD [facility] None  10/07/2019  1:30 PM Schnier, Dolores Lory, MD [facility] None    Primary Care Physician: Rachel Peach, MD Location: [facility] Note by: Rachel Santa, MD Date: 07/13/2019; Time: 12:15 PM  Note: This dictation was prepared with Dragon dictation. Any transcriptional errors that may result from this process are unintentional.

## 2019-07-15 LAB — COMPLIANCE DRUG ANALYSIS, UR

## 2019-07-27 ENCOUNTER — Telehealth (INDEPENDENT_AMBULATORY_CARE_PROVIDER_SITE_OTHER): Payer: Self-pay

## 2019-07-27 ENCOUNTER — Other Ambulatory Visit (INDEPENDENT_AMBULATORY_CARE_PROVIDER_SITE_OTHER): Payer: Self-pay | Admitting: Nurse Practitioner

## 2019-07-27 NOTE — Telephone Encounter (Signed)
Patient will like go to a outpatient physical therapy at [facility] physical therapy and rehab

## 2019-07-27 NOTE — Telephone Encounter (Signed)
I believe the patient has advanced home care, let's see if we can contact them with orders to see if they can get PT to eval and treat.

## 2019-07-28 ENCOUNTER — Other Ambulatory Visit (INDEPENDENT_AMBULATORY_CARE_PROVIDER_SITE_OTHER): Payer: Self-pay | Admitting: Nurse Practitioner

## 2019-07-28 DIAGNOSIS — Z89512 Acquired absence of left leg below knee: Secondary | ICD-10-CM

## 2019-07-28 NOTE — Telephone Encounter (Signed)
It is in.

## 2019-07-28 NOTE — Telephone Encounter (Signed)
Patient has been made aware that referral has been placed

## 2019-08-02 ENCOUNTER — Encounter: Payer: Self-pay | Admitting: Student in an Organized Health Care Education/Training Program

## 2019-08-02 ENCOUNTER — Other Ambulatory Visit: Payer: Self-pay

## 2019-08-02 ENCOUNTER — Ambulatory Visit
Payer: Medicare HMO | Attending: Student in an Organized Health Care Education/Training Program | Admitting: Student in an Organized Health Care Education/Training Program

## 2019-08-02 DIAGNOSIS — M1711 Unilateral primary osteoarthritis, right knee: Secondary | ICD-10-CM | POA: Diagnosis present

## 2019-08-02 MED ORDER — SODIUM HYALURONATE (VISCOSUP) 20 MG/2ML IX SOSY
2.0000 mL | PREFILLED_SYRINGE | Freq: Once | INTRA_ARTICULAR | Status: AC
  Administered 2019-08-02: 10:00:00 via INTRA_ARTICULAR

## 2019-08-02 MED ORDER — LIDOCAINE HCL 2 % IJ SOLN
INTRAMUSCULAR | Status: AC
  Filled 2019-08-02: qty 20

## 2019-08-02 MED ORDER — LIDOCAINE HCL 2 % IJ SOLN
20.0000 mL | Freq: Once | INTRAMUSCULAR | Status: AC
  Administered 2019-08-02: 400 mg

## 2019-08-02 NOTE — Patient Instructions (Addendum)
____________________________________________________________________________________________  Post-Procedure Discharge Instructions  Instructions:  Apply ice:   Purpose: This will minimize any swelling and discomfort after procedure.   When: Day of procedure, as soon as you get home.  How: Fill a plastic sandwich bag with crushed ice. Cover it with a small towel and apply to injection site.  How long: (15 min on, 15 min off) Apply for 15 minutes then remove x 15 minutes.  Repeat sequence on day of procedure, until you go to bed.  Apply heat:   Purpose: To treat any soreness and discomfort from the procedure.  When: Starting the next day after the procedure.  How: Apply heat to procedure site starting the day following the procedure.  How long: May continue to repeat daily, until discomfort goes away.  Food intake: Start with clear liquids (like water) and advance to regular food, as tolerated.   Physical activities: Keep activities to a minimum for the first 8 hours after the procedure. After that, then as tolerated.  Driving: If you have received any sedation, be responsible and do not drive. You are not allowed to drive for 24 hours after having sedation.  Blood thinner: (Applies only to those taking blood thinners) You may restart your blood thinner 6 hours after your procedure.  Insulin: (Applies only to Diabetic patients taking insulin) As soon as you can eat, you may resume your normal dosing schedule.  Infection prevention: Keep procedure site clean and dry. Shower daily and clean area with soap and water.  Post-procedure Pain Diary: Extremely important that this be done correctly and accurately. Recorded information will be used to determine the next step in treatment. For the purpose of accuracy, follow these rules:  Evaluate only the area treated. Do not report or include pain from an untreated area. For the purpose of this evaluation, ignore all other areas of pain,  except for the treated area.  After your procedure, avoid taking a long nap and attempting to complete the pain diary after you wake up. Instead, set your alarm clock to go off every hour, on the hour, for the initial 8 hours after the procedure. Document the duration of the numbing medicine, and the relief you are getting from it.  Do not go to sleep and attempt to complete it later. It will not be accurate. If you received sedation, it is likely that you were given a medication that may cause amnesia. Because of this, completing the diary at a later time may cause the information to be inaccurate. This information is needed to plan your care.  Follow-up appointment: Keep your post-procedure follow-up evaluation appointment after the procedure (usually 2 weeks for most procedures, 6 weeks for radiofrequencies). DO NOT FORGET to bring you pain diary with you.   Expect: (What should I expect to see with my procedure?)  From numbing medicine (AKA: Local Anesthetics): Numbness or decrease in pain. You may also experience some weakness, which if present, could last for the duration of the local anesthetic.  Onset: Full effect within 15 minutes of injected.  Duration: It will depend on the type of local anesthetic used. On the average, 1 to 8 hours.   From steroids (Applies only if steroids were used): Decrease in swelling or inflammation. Once inflammation is improved, relief of the pain will follow.  Onset of benefits: Depends on the amount of swelling present. The more swelling, the longer it will take for the benefits to be seen. In some cases, up to 10 days.    Duration: Steroids will stay in the system x 2 weeks. Duration of benefits will depend on multiple posibilities including persistent irritating factors.  Side-effects: If present, they may typically last 2 weeks (the duration of the steroids).  Frequent: Cramps (if they occur, drink Gatorade and take over-the-counter Magnesium 450-500 mg  once to twice a day); water retention with temporary weight gain; increases in blood sugar; decreased immune system response; increased appetite.  Occasional: Facial flushing (red, warm cheeks); mood swings; menstrual changes.  Uncommon: Long-term decrease or suppression of natural hormones; bone thinning. (These are more common with higher doses or more frequent use. This is why we prefer that our patients avoid having any injection therapies in other practices.)   Very Rare: Severe mood changes; psychosis; aseptic necrosis.  From procedure: Some discomfort is to be expected once the numbing medicine wears off. This should be minimal if ice and heat are applied as instructed.  Call if: (When should I call?)  You experience numbness and weakness that gets worse with time, as opposed to wearing off.  New onset bowel or bladder incontinence. (Applies only to procedures done in the spine)  Emergency Numbers:  Durning business hours (Monday - Thursday, 8:00 AM - 4:00 PM) (Friday, 9:00 AM - 12:00 Noon): [phone]  After hours: [phone]  NOTE: If you are having a problem and are unable connect with, or to talk to a provider, then go to your nearest urgent care or emergency department. If the problem is serious and urgent, please call 911. ____________________________________________________________________________________________   Knee Injection A knee injection is a procedure to get medicine into your knee joint to relieve the pain, swelling, and stiffness of arthritis. Your health care provider uses a needle to inject medicine, which may also help to lubricate and cushion your knee joint. You may need more than one injection. Tell a health care provider about:  Any allergies you have.  All medicines you are taking, including vitamins, herbs, eye drops, creams, and over-the-counter medicines.  Any problems you or family members have had with anesthetic medicines.  Any blood  disorders you have.  Any surgeries you have had.  Any medical conditions you have.  Whether you are pregnant or may be pregnant. What are the risks? Generally, this is a safe procedure. However, problems may occur, including:  Infection.  Bleeding.  Symptoms that get worse.  Damage to the area around your knee.  Allergic reaction to any of the medicines.  Skin reactions from repeated injections. What happens before the procedure?  Ask your health care provider about changing or stopping your regular medicines. This is especially important if you are taking diabetes medicines or blood thinners.  Plan to have someone take you home from the hospital or clinic. What happens during the procedure?   You will sit or lie down in a position for your knee to be treated.  The skin over your kneecap will be cleaned with a germ-killing soap.  You will be given a medicine that numbs the area (local anesthetic). You may feel some stinging.  The medicine will be injected into your knee. The needle is carefully placed between your kneecap and your knee. The medicine is injected into the joint space.  The needle will be removed at the end of the procedure.  A bandage (dressing) may be placed over the injection site. The procedure may vary among health care providers and hospitals. What can I expect after the procedure?  Your blood pressure,   heart rate, breathing rate, and blood oxygen level will be monitored until you leave the hospital or clinic.  You may have to move your knee through its full range of motion. This helps to get all the medicine into your joint space.  You will be watched to make sure that you do not have a reaction to the injected medicine.  You may feel more pain, swelling, and warmth than you did before the injection. This reaction may last about 1-2 days. Follow these instructions at home: Medicines  Take over-the-counter and prescription medicines only as  told by your doctor.  Do not drive or use heavy machinery while taking prescription pain medicine.  Do not take medicines such as aspirin and ibuprofen unless your health care provider tells you to take them. Injection site care  Follow instructions from your health care provider about: ? How to take care of your puncture site. ? When and how you should change your dressing. ? When you should remove your dressing.  Check your injection area every day for signs of infection. Check for: ? More redness, swelling, or pain after 2 days. ? Fluid or blood. ? Pus or a bad smell. ? Warmth. Managing pain, stiffness, and swelling   If directed, put ice on the injection area: ? Put ice in a plastic bag. ? Place a towel between your skin and the bag. ? Leave the ice on for 20 minutes, 2-3 times per day.  Do not apply heat to your knee.  Raise (elevate) the injection area above the level of your heart while you are sitting or lying down. General instructions  If you were given a dressing, keep it dry until your health care provider says it can be removed. Ask your health care provider when you can start showering or taking a bath.  Avoid strenuous activities for as long as directed by your health care provider. Ask your health care provider when you can return to your normal activities.  Keep all follow-up visits as told by your health care provider. This is important. You may need more injections. Contact a health care provider if you have:  A fever.  Warmth in your injection area.  Fluid, blood, or pus coming from your injection site.  Symptoms at your injection site that last longer than 2 days after your procedure. Get help right away if:  Your knee: ? Turns very red. ? Becomes very swollen. ? Is in severe pain. Summary  A knee injection is a procedure to get medicine into your knee joint to relieve the pain, swelling, and stiffness of arthritis.  A needle is carefully  placed between your kneecap and your knee to inject medicine into the joint space.  Before the procedure, ask your health care provider about changing or stopping your regular medicines, especially if you are taking diabetes medicines or blood thinners.  Contact your health care provider if you have any problems or questions after your procedure. This information is not intended to replace advice given to you by your health care provider. Make sure you discuss any questions you have with your health care provider. Document Released: 01/12/2007 Document Revised: 11/10/2017 Document Reviewed: 11/10/2017 Elsevier Patient Education  2020 [employer]  

## 2019-08-02 NOTE — Progress Notes (Signed)
Patient's Name: Rachel Holmes  MRN: [id number]  Referring Provider: Gillis Santa, MD  DOB: 1961/10/03  PCP: Sharyne Peach, MD  DOS: 08/02/2019  Note by: Gillis Santa, MD  Service setting: Ambulatory outpatient  Specialty: Interventional Pain Management  Patient type: Established  Location: [facility] (AMB) Pain Management Facility  Visit type: Interventional Procedure   Primary Reason for Visit: Interventional Pain Management Treatment. CC: Knee Pain  Procedure:          Anesthesia, Analgesia, Anxiolysis:  Type: Therapeutic Intra-Articular Hyalgan Knee Injection #1  Region: Lateral infrapatellar Knee Region Level: Knee Joint Laterality: Right knee  Type: Local Anesthesia Indication(s): Analgesia         Local Anesthetic: Lidocaine 1-2% Route: Infiltration ([employer]/IM) IV Access: Declined Sedation: Declined   Position: Sitting   Indications: 1. Primary osteoarthritis of right knee    Pain Score: Pre-procedure: 4 /10 Post-procedure: 0-No pain/10   Pre-op Assessment:  Rachel Holmes is a 58 y.o. (year old), female patient, seen today for interventional treatment. She  has a past surgical history that includes Abdominal hysterectomy; TEE without cardioversion (N/A, 12/28/2018); IR Fluoro Guide CV Line Right (12/29/2018); IR US Guide Vasc Access Right (12/29/2018); IR Removal Tun Cv Cath W/O FL (02/19/2019); Vaginal hysterectomy; Breast biopsy (Left, 2014); Eye surgery (Left, 2007); and Amputation (Left, 05/05/2019). Rachel Holmes has a current medication list which includes the following prescription(s): albuterol, amlodipine, aspirin, one touch ultra 2, clobetasol cream, clonidine, freestyle libre 14 day reader, freestyle libre 14 day sensor, docusate sodium, dulaglutide, duloxetine, ensure max protein, fluticasone, gabapentin, glucosamine-chondroitin, glucose blood, novolog flexpen, insulin pen needle, isosorbide mononitrate, onetouch ultrasoft, multivitamin with minerals, omeprazole, ondansetron,  sodium bicarbonate, torsemide, tramadol, vitamin b-12, and ascorbic acid. Her primarily concern today is the Knee Pain  Initial Vital Signs:  Pulse/HCG Rate: 72  Temp: 98.4 F (36.9 C) Resp:   BP: (!) 155/87 SpO2: 100 %  BMI: Estimated body mass index is 42.42 kg/m as calculated from the following:   Height as of this encounter: 5\' 8"  (1.727 m).   Weight as of this encounter: 279 lb (126.6 kg).  Risk Assessment: Allergies: Reviewed. She is allergic to statins; penicillins; codeine; ibuprofen; percocet [oxycodone-acetaminophen]; tramadol; and vicodin [hydrocodone-acetaminophen].  Allergy Precautions: None required Coagulopathies: Reviewed. None identified.  Blood-thinner therapy: None at this time Active Infection(s): Reviewed. None identified. Rachel Holmes is afebrile  Site Confirmation: Rachel Holmes was asked to confirm the procedure and laterality before marking the site Procedure checklist: Completed Consent: Before the procedure and under the influence of no sedative(s), amnesic(s), or anxiolytics, the patient was informed of the treatment options, risks and possible complications. To fulfill our ethical and legal obligations, as recommended by the American Medical Association's Code of Ethics, I have informed the patient of my clinical impression; the nature and purpose of the treatment or procedure; the risks, benefits, and possible complications of the intervention; the alternatives, including doing nothing; the risk(s) and benefit(s) of the alternative treatment(s) or procedure(s); and the risk(s) and benefit(s) of doing nothing. The patient was provided information about the general risks and possible complications associated with the procedure. These may include, but are not limited to: failure to achieve desired goals, infection, bleeding, organ or nerve damage, allergic reactions, paralysis, and death. In addition, the patient was informed of those risks and complications  associated to the procedure, such as failure to decrease pain; infection; bleeding; organ or nerve damage with subsequent damage to sensory, motor, and/or autonomic systems, resulting in permanent pain,  numbness, and/or weakness of one or several areas of the body; allergic reactions; (i.e.: anaphylactic reaction); and/or death. Furthermore, the patient was informed of those risks and complications associated with the medications. These include, but are not limited to: allergic reactions (i.e.: anaphylactic or anaphylactoid reaction(s)); adrenal axis suppression; blood sugar elevation that in diabetics may result in ketoacidosis or comma; water retention that in patients with history of congestive heart failure may result in shortness of breath, pulmonary edema, and decompensation with resultant heart failure; weight gain; swelling or edema; medication-induced neural toxicity; particulate matter embolism and blood vessel occlusion with resultant organ, and/or nervous system infarction; and/or aseptic necrosis of one or more joints. Finally, the patient was informed that Medicine is not an exact science; therefore, there is also the possibility of unforeseen or unpredictable risks and/or possible complications that may result in a catastrophic outcome. The patient indicated having understood very clearly. We have given the patient no guarantees and we have made no promises. Enough time was given to the patient to ask questions, all of which were answered to the patient's satisfaction. Rachel Holmes has indicated that she wanted to continue with the procedure. Attestation: I, the ordering provider, attest that I have discussed with the patient the benefits, risks, side-effects, alternatives, likelihood of achieving goals, and potential problems during recovery for the procedure that I have provided informed consent. Date  Time: 08/02/2019  9:02 AM  Pre-Procedure Preparation:  Monitoring: As per clinic protocol.  Respiration, ETCO2, SpO2, BP, heart rate and rhythm monitor placed and checked for adequate function Safety Precautions: Patient was assessed for positional comfort and pressure points before starting the procedure. Time-out: I initiated and conducted the "Time-out" before starting the procedure, as per protocol. The patient was asked to participate by confirming the accuracy of the "Time Out" information. Verification of the correct person, site, and procedure were performed and confirmed by me, the nursing staff, and the patient. "Time-out" conducted as per Joint Commission's Universal Protocol (UP.01.01.01). Time: 0925  Description of Procedure:          Target Area: Knee Joint Approach: Just above the Lateral tibial plateau, lateral to the infrapatellar tendon. Area Prepped: Entire knee area, from the mid-thigh to the mid-shin. Prepping solution: DuraPrep (Iodine Povacrylex [0.7% available iodine] and Isopropyl Alcohol, 74% w/w) Safety Precautions: Aspiration looking for blood return was conducted prior to all injections. At no point did we inject any substances, as a needle was being advanced. No attempts were made at seeking any paresthesias. Safe injection practices and needle disposal techniques used. Medications properly checked for expiration dates. SDV (single dose vial) medications used. Description of the Procedure: Protocol guidelines were followed. The patient was placed in position over the fluoroscopy table. The target area was identified and the area prepped in the usual manner. Skin & deeper tissues infiltrated with local anesthetic. Appropriate amount of time allowed to pass for local anesthetics to take effect. The procedure needles were then advanced to the target area. Proper needle placement secured. Negative aspiration confirmed. Solution injected in intermittent fashion, asking for systemic symptoms every 0.5cc of injectate. The needles were then removed and the area cleansed,  making sure to leave some of the prepping solution back to take advantage of its long term bactericidal properties. Vitals:   08/02/19 0905  BP: (!) 155/87  Pulse: 72  Temp: 98.4 F (36.9 C)  SpO2: 100%  Weight: 279 lb (126.6 kg)  Height: 5\' 8"  (1.727 m)    Start Time:  0925 hrs. End Time: 0927 hrs. Materials:  Needle(s) Type: Regular needle Gauge: 25G Length: 1.5-in Medication(s): Please see orders for medications and dosing details.  Imaging Guidance:          Type of Imaging Technique: None used Indication(s): N/A Exposure Time: No patient exposure Contrast: None used. Fluoroscopic Guidance: N/A Ultrasound Guidance: N/A Interpretation: N/A  Antibiotic Prophylaxis:   Anti-infectives (From admission, onward)   None     Indication(s): None identified  Post-operative Assessment:  Post-procedure Vital Signs:  Pulse/HCG Rate: 72  Temp: 98.4 F (36.9 C) Resp:   BP: (!) 155/87 SpO2: 100 %  EBL: None  Complications: No immediate post-treatment complications observed by team, or reported by patient.  Note: The patient tolerated the entire procedure well. A repeat set of vitals were taken after the procedure and the patient was kept under observation following institutional policy, for this type of procedure. Post-procedural neurological assessment was performed, showing return to baseline, prior to discharge. The patient was provided with post-procedure discharge instructions, including a section on how to identify potential problems. Should any problems arise concerning this procedure, the patient was given instructions to immediately contact us, at any time, without hesitation. In any case, we plan to contact the patient by telephone for a follow-up status report regarding this interventional procedure.  Comments:  No additional relevant information.  Plan of Care  Orders:  Orders Placed This Encounter  Procedures  . KNEE INJECTION    Hyalgan knee injection. Please  order Hyalgan.    Standing Status:   Future    Standing Expiration Date:   09/01/2019    Scheduling Instructions:     Procedure: Intra-articular Hyalgan Knee injection            Side:RIGHT     Sedation: None     Timeframe: in 3 weeks    Order Specific Question:   Where will this procedure be performed?    Answer:   [facility]    Medications ordered for procedure: Meds ordered this encounter  Medications  . Sodium Hyaluronate SOSY 2 mL  . lidocaine (XYLOCAINE) 2 % (with pres) injection 400 mg   Medications administered: We administered Sodium Hyaluronate and lidocaine.  See the medical record for exact dosing, route, and time of administration.  Follow-up plan:   Return in about 3 weeks (around 08/23/2019) for Procedure R knee hyalgan.     Recent Visits Date Type Provider Dept  07/13/19 Office Visit Gillis Santa, MD [facility]  Showing recent visits within past 90 days and meeting all other requirements   Today's Visits Date Type Provider Dept  08/02/19 Procedure visit Gillis Santa, MD [facility]  Showing today's visits and meeting all other requirements   Future Appointments Date Type Provider Dept  08/23/19 Appointment Gillis Santa, MD [facility]  Showing future appointments within next 90 days and meeting all other requirements   Disposition: Discharge home  Discharge Date & Time: 08/02/2019; 0928 hrs.   Primary Care Physician: Sharyne Peach, MD Location: [facility] Note by: Gillis Santa, MD Date: 08/02/2019; Time: 9:40 AM  Disclaimer:  Medicine is not an exact science. The only guarantee in medicine is that nothing is guaranteed. It is important to note that the decision to proceed with this intervention was based on the information collected from the patient. The Data and conclusions were drawn from the patient's questionnaire, the interview, and the physical examination. Because the  information  was provided in large part by the patient, it cannot be guaranteed that it has not been purposely or unconsciously manipulated. Every effort has been made to obtain as much relevant data as possible for this evaluation. It is important to note that the conclusions that lead to this procedure are derived in large part from the available data. Always take into account that the treatment will also be dependent on availability of resources and existing treatment guidelines, considered by other Pain Management Practitioners as being common knowledge and practice, at the time of the intervention. For Medico-Legal purposes, it is also important to point out that variation in procedural techniques and pharmacological choices are the acceptable norm. The indications, contraindications, technique, and results of the above procedure should only be interpreted and judged by a Board-Certified Interventional Pain Specialist with extensive familiarity and expertise in the same exact procedure and technique.

## 2019-08-02 NOTE — Progress Notes (Signed)
Safety precautions to be maintained throughout the outpatient stay will include: orient to surroundings, keep bed in low position, maintain call bell within reach at all times, provide assistance with transfer out of bed and ambulation.  

## 2019-08-10 ENCOUNTER — Other Ambulatory Visit: Payer: Self-pay

## 2019-08-11 ENCOUNTER — Ambulatory Visit: Payer: Medicare HMO | Attending: Nurse Practitioner

## 2019-08-11 ENCOUNTER — Other Ambulatory Visit: Payer: Self-pay

## 2019-08-11 DIAGNOSIS — M6281 Muscle weakness (generalized): Secondary | ICD-10-CM | POA: Diagnosis present

## 2019-08-11 DIAGNOSIS — R2681 Unsteadiness on feet: Secondary | ICD-10-CM | POA: Insufficient documentation

## 2019-08-11 DIAGNOSIS — R2689 Other abnormalities of gait and mobility: Secondary | ICD-10-CM | POA: Insufficient documentation

## 2019-08-11 NOTE — Therapy (Signed)
[place] MAIN [facility] SERVICES [address] [place], [place], [address] Phone: [phone]   Fax:  [phone]  Physical Therapy Evaluation  Patient Details  Name: Rachel Holmes MRN: [id number] Date of Birth: Nov 02, 1961 Referring Provider (PT): Eulogio Ditch    Encounter Date: 08/11/2019  PT End of Session - 08/11/19 1619    Visit Number  1    Number of Visits  16    Date for PT Re-Evaluation  10/06/19    Authorization Type  1/10 eval 08/11/19    PT Start Time  0902    PT Stop Time  1005    PT Time Calculation (min)  63 min    Equipment Utilized During Treatment  Gait belt;Other (comment)   L prosthesis   Activity Tolerance  Patient tolerated treatment well;Patient limited by fatigue    Behavior During Therapy  [facility] for tasks assessed/performed       Past Medical History:  Diagnosis Date  . Allergic rhinitis   . Allergy   . Anemia   . Anxiety   . Arthritis   . Back pain   . Bradycardia   . Breast mass    Patient can no longer palpate specific masses but showed tech general area of concern  . CHF (congestive heart failure) ([place])   . CKD (chronic kidney disease)    STAGE 3  . Constipation   . Diabetes mellitus without complication ([place])   . Diabetic nephropathy ([place])   . Dyspnea   . GERD (gastroesophageal reflux disease)   . Heart murmur   . HLD (hyperlipidemia)   . Hyperparathyroidism ([place])   . Hypertension   . Joint pain   . Leg edema   . Legally blind in left eye, as defined in [place]   . Lymphedema   . Obesity   . Onychomycosis     Past Surgical History:  Procedure Laterality Date  . ABDOMINAL HYSTERECTOMY    . AMPUTATION Left 05/05/2019   Procedure: AMPUTATION BELOW KNEE;  Surgeon: Katha Cabal, MD;  Location: [facility] ORS;  Service: Vascular;  Laterality: Left;  . BREAST BIOPSY Left 2014   FNA 12:00 position - Negative  . EYE SURGERY Left 2007   removed a lens, no lens implanted  . IR FLUORO GUIDE CV LINE  RIGHT  12/29/2018  . IR REMOVAL TUN CV CATH W/O FL  02/19/2019  . IR US GUIDE VASC ACCESS RIGHT  12/29/2018  . TEE WITHOUT CARDIOVERSION N/A 12/28/2018   Procedure: TRANSESOPHAGEAL ECHOCARDIOGRAM (TEE);  Surgeon: Sueanne Margarita, MD;  Location: [facility] ENDOSCOPY;  Service: Cardiovascular;  Laterality: N/A;  . VAGINAL HYSTERECTOMY     abdominal hyst, not a vaginal hyst    There were no vitals filed for this visit.   Subjective Assessment - 08/11/19 0915    Subjective  Patient presents for evaluation of L BKA.    Pertinent History  Patient is a pleasant 58 year old female who presents for gait training/ stability for L BKA. Recently obtained her prosthesis last Friday 08/06/19 and has not worn it since the prosthetists office. PMH includes anemia, anxiety, arthritis, back pain, bradycardia, CHF, CKD stage II, DM, diabetic nephropathy, dyspnea, GERD, HLD, hyperparathyroidism, hypotension, legally blind in left eye, lymphedema, obesity, onychomycosis. Left BKA performed 05/05/19, noted in history that patient had a fall after procedure injuring the anterior portion of the suture line. Currently using a manual chair to get around the home. At this time she can't  get into bathroom due to wheelchair, is waiting for a handicap apartment.    Limitations  Lifting;Standing;Walking;House hold activities    How long can you sit comfortably?  n/a    How long can you stand comfortably?  only stood once with prosthetist    How long can you walk comfortably?  not able to walk yet.    Patient Stated Goals  to walk again with prosthesis    Currently in Pain?  Yes    Pain Score  4     Pain Location  Knee    Pain Orientation  Right    Pain Descriptors / Indicators  Aching    Pain Type  Chronic pain    Pain Onset  More than a month ago    Pain Frequency  Intermittent    Aggravating Factors   weightbearing    Pain Relieving Factors  rest, injections          PAIN: Phantom pain in LLE, every other day, 5/10.   Chronic pain of R knee with weightbearing: ranges 6-8 at worst  POSTURE: Forward head rounded shoulder.  Standing: weight shift onto prosthetic limb, knee flexion of RLE due to pain  PROM/AROM: Hip extension limited bilaterally, -6 degrees LLE, -2 degrees RLE  STRENGTH:  Graded on a 0-5 scale Muscle Group Left Right  Hip Flex 3+/5 4-/5  Hip Abd 3+/5 3+/5  Hip Add 3+/5 3+/5  Hip Ext 2+/5 2+/5  Hip IR/ER    Knee Flex 3+/5 3/5*  Knee Ext 3+/5 3/5*  Ankle DF  4/5  Ankle PF  4/5  * painful  SENSATION/ Palpation: Decreased sensation R lower limb compared to upper limb, poor sensation of residual limb, difficulty assessing where touch is.   Palpation to scar of residual limb: flexible, slight raising of wound but WFL/ no warmth/redness/swelling  FUNCTIONAL MOBILITY: Sit <>supine: able to perform with increased time and effort, able to perform without assistance  Supine ><sidelying><prone with increased time and effort, does not need assistance to obtain  Stand pivot transfer: requires Mod A with use of prosthesis and RW  BALANCE: Dynamic Sitting Balance  Normal Able to sit unsupported and weight shift across midline maximally   Good Able to sit unsupported and weight shift across midline moderately   Good-/Fair+ Able to sit unsupported and weight shift across midline minimally   Fair Minimal weight shifting ipsilateral/front, difficulty crossing midline x  Fair- Reach to ipsilateral side and unable to weight shift   Poor + Able to sit unsupported with min A and reach to ipsilateral side, unable to weight shift   Poor Able to sit unsupported with mod A and reach ipsilateral/front-can't cross midline     Standing Dynamic Balance  Normal Stand independently unsupported, able to weight shift and cross midline maximally   Good Stand independently unsupported, able to weight shift and cross midline moderately   Good-/Fair+ Stand independently unsupported, able to weight shift  across midline minimally   Fair Stand independently unsupported, weight shift, and reach ipsilaterally, loss of balance when crossing midline   Poor+ Able to stand with Min A and reach ipsilaterally, unable to weight shift x  Poor Able to stand with Mod A and minimally reach ipsilaterally, unable to cross midline.     Static Sitting Balance  Normal Able to maintain balance against maximal resistance   Good Able to maintain balance against moderate resistance   Good-/Fair+ Accepts minimal resistance x  Fair Able to sit unsupported without  balance loss and without UE support   Poor+ Able to maintain with Minimal assistance from individual or chair   Poor Unable to maintain balance-requires mod/max support from individual or chair     Static Standing Balance  Normal Able to maintain standing balance against maximal resistance   Good Able to maintain standing balance against moderate resistance   Good-/Fair+ Able to maintain standing balance against minimal resistance   Fair Able to stand unsupported without UE support and without LOB for 1-2 min   Fair- Requires Min A and UE support to maintain standing without loss of balance x  Poor+ Requires mod A and UE support to maintain standing without loss of balance   Poor Requires max A and UE support to maintain standing balance without loss     Stand: weight shifts laterally with RW and CGA in standing  GAIT: Unable to assess due to increased time required for donning prosthesis due to need for education on sockwear, donning, etc.   OUTCOME MEASURES: TEST Outcome Interpretation  5 times sit<>stand 21 sec heavy BUE support  >74 yo, >15 sec indicates increased risk for falls                  Standing tolerance:  1 min 39 seconds        Patient HEP Prone 10 minutes each day to stretch hip flexors sidelying hip extension 10x each day Prosthetic wear time: education hour/day increase only by 30 minutes a day to avoid breakdown/pain    Patient educated on donning/doffing prosthesis. Sock wear based on shrinkage, swelling daily, and proper alignmetn for positioning of pin. X 20 minutes, very challenging for patient, requires Mod assistance for performance.   Patient additionally educated on phantom pain reduction, use of rubbing end of residual limb as well as opposite limb   Objective measurements completed on examination: See above findings.               PT Education - 08/11/19 1618    Education Details  HEP, donning/doffing prosthesis, goals, sock wear    Person(s) Educated  Patient    Methods  Explanation;Demonstration;Tactile cues;Verbal cues    Comprehension  Verbalized understanding;Returned demonstration;Verbal cues required;Tactile cues required;Need further instruction       PT Short Term Goals - 08/11/19 1729      PT SHORT TERM GOAL #1   Title  Patient will be independent in home exercise program to improve strength/mobility for better functional independence with ADLs.    Baseline  10/7: HEP given    Time  2    Period  Weeks    Status  New    Target Date  08/25/19        PT Long Term Goals - 08/11/19 1730      PT LONG TERM GOAL #1   Title  Patient will tolerate static standing for > 5 minutes for standing ADL performance with weight acceptance onto prosthetic limb to improve quality of life and mobility.    Baseline  10/7: 1 minute 39 seconds with heavy UE support on RW    Time  8    Period  Weeks    Status  New    Target Date  10/06/19      PT LONG TERM GOAL #2   Title  Patient will ambulate >100 feet with RW, mod I with right prosthetic exhibiting reciprocal gait pattern with good  management of prosthetic and good safety awareness for improved home ambulation  Baseline  10/7: unable to ambulate    Time  8    Period  Weeks    Status  New    Target Date  10/06/19      PT LONG TERM GOAL #3   Title  Patient (< 58 years old) will complete five times sit to stand test in < 10  seconds indicating an increased LE strength and improved balance.    Baseline  10/7: 21 seconds with heavy BUE support from raised surface    Time  8    Period  Weeks    Status  New    Target Date  10/06/19      PT LONG TERM GOAL #4   Title  Patient will increase BLE gross strength to 4+/5 as to improve functional strength for independent gait, increased standing tolerance and increased ADL ability.    Baseline  10/7: L 3+/5 R 4-/5    Time  8    Period  Weeks    Status  New    Target Date  10/06/19             Plan - 08/11/19 1628    Clinical Impression Statement  Patient is a pleasant 58 year old female s/p L BKA in July [id number] with a complication due to a fall resulting in prolonged healing. Patient is very motivated however has noted limitations due to body habitus. Patient required extensive education on donning/doffing prosthesis, sockwear, prosthetic wear timetable, shrinker wear, and positioning due to lack of education on these topics prior to session. Standing tolerance with RW was 1 minutes and 39 seconds with pain and fatigue limiting duration. Patient will benefit from skilled physical therapy for ambulation and transfers with prosthesis, strength and stability, and increase stability with functional mobility for quality of life and decreased falls risk.    Personal Factors and Comorbidities  Age;Comorbidity 3+;Education;Finances;Fitness;Past/Current Experience;Social Background;Time since onset of injury/illness/exacerbation;Other    Comorbidities  anemia, anxiety, arthritis, back pain, bradycardia, CHF, CKD stage III, DM, diabetic nephropathy, dyspnea, GERD, heart murmer, HLD, hyperparathyroidism, HTN, blind in L eye, obesity, oncychomycosis    Examination-Activity Limitations  Bathing;Bed Mobility;Caring for Others;Bend;Carry;Dressing;Continence;Hygiene/Grooming;Stairs;Squat;Reach Overhead;Locomotion Level;Lift;Stand;Toileting;Transfers    Examination-Participation  Restrictions  Church;Cleaning;Community Activity;Interpersonal Relationship;Driving;Laundry;Volunteer;Shop;Personal Finances;Meal Prep;Yard Work;Other    Stability/Clinical Decision Making  Evolving/Moderate complexity    Clinical Decision Making  Moderate    Rehab Potential  Fair    PT Frequency  2x / week    PT Duration  8 weeks    PT Treatment/Interventions  ADLs/Self Care Home Management;Aquatic Therapy;Biofeedback;Cryotherapy;Electrical Stimulation;Iontophoresis 4mg /ml Dexamethasone;Moist Heat;Traction;Ultrasound;Therapeutic activities;Functional mobility training;Stair training;Gait training;DME Instruction;Therapeutic exercise;Balance training;Neuromuscular re-education;Patient/family education;Manual techniques;Wheelchair Tax adviser;Compression bandaging;Scar mobilization;Passive range of motion;Dry needling;Energy conservation;Splinting;Taping    PT Next Visit Plan  ambulate in // bars    PT Home Exercise Plan  prone 10 minutes a day, wear prosthesis 1 hour a day, side lie posterior kick backs 10x    Consulted and Agree with Plan of Care  Patient       Patient will benefit from skilled therapeutic intervention in order to improve the following deficits and impairments:  Abnormal gait, Cardiopulmonary status limiting activity, Decreased activity tolerance, Decreased balance, Decreased knowledge of precautions, Decreased endurance, Decreased coordination, Decreased knowledge of use of DME, Decreased mobility, Decreased range of motion, Decreased safety awareness, Difficulty walking, Decreased strength, Hypomobility, Impaired flexibility, Impaired perceived functional ability, Impaired sensation, Obesity, Prosthetic Dependency, Postural dysfunction, Improper body mechanics, Pain  Visit Diagnosis: Other abnormalities of gait and  mobility  Unsteadiness on feet  Muscle weakness (generalized)     Problem List Patient Active Problem List   Diagnosis Date Noted   . Chronic pain syndrome 07/13/2019  . Wound dehiscence 05/29/2019  . Hx of BKA, left ([place]) 05/24/2019  . PVD (peripheral vascular disease) ([place]) 05/13/2019  . Chronic osteomyelitis of ankle and foot, left ([place]) 05/05/2019  . Chronic diastolic CHF (congestive heart failure) ([place]) 04/19/2019  . Leg edema 04/19/2019  . Acute bacterial endocarditis 04/19/2019  . Chest pain of uncertain etiology [id number]  . Ulcer of left ankle ([place]) 04/14/2019  . Ankle deformity, left 01/21/2019  . Diabetic polyneuropathy associated with type 2 diabetes mellitus ([place]) 01/21/2019  . Aortic valve endocarditis   . Bacteremia due to Streptococcus 12/24/2018  . Diabetic ulcer of left ankle associated with diabetes mellitus due to underlying condition, with fat layer exposed ([place]) 11/09/2018  . Malignant hypertension (arteriolar nephrosclerosis), stage 1-4 or unspecified chronic kidney disease 07/15/2018  . Stage 4 chronic kidney disease ([place]) 07/15/2018  . Morbid obesity with BMI of 45.0-49.9, adult ([place]) 07/15/2018  . Excessive daytime sleepiness 07/15/2018  . Other fatigue 03/11/2018  . Essential hypertension 03/11/2018  . Vitamin D deficiency 03/11/2018  . Congestive heart failure ([place]) 03/11/2018  . Primary osteoarthritis of right knee 01/06/2018  . Uncontrolled type 2 diabetes mellitus with polyneuropathy ([place]) 10/03/2017  . Chronic pain of right knee 10/02/2017  . Obesity 10/02/2017  . Charcot's joint of left foot 08/29/2017  . Lymphedema 09/06/2016  . Hyponatremia with extracellular fluid depletion 02/02/2016  . Nausea with vomiting, unspecified 02/02/2016  . Closed nondisplaced fracture of right patella 08/23/2015  . Acute cystitis without hematuria 08/14/2014  . Colitis 08/14/2014  . HTN (hypertension), malignant 08/14/2014  . Acute renal failure superimposed on stage 3 chronic kidney disease ([place]) 06/26/2014  . Hyperparathyroidism, unspecified ([place]) 02/24/2014  . Allergic rhinitis 02/22/2014   . Onychomycosis 02/22/2014  . Anxiety 11/05/2013  . Bradycardia 05/14/2013  . Gastro-esophageal reflux disease without esophagitis 04/05/2013  . Anemia in other chronic diseases classified elsewhere 01/27/2013   Janna Arch, PT, DPT    08/11/2019, 5:41 PM  [place] MAIN [facility] SERVICES [address] [place], [place], [address] Phone: [phone]   Fax:  [phone]  Name: Rachel Holmes MRN: [id number] Date of Birth: 19-Sep-1961

## 2019-08-12 ENCOUNTER — Ambulatory Visit (INDEPENDENT_AMBULATORY_CARE_PROVIDER_SITE_OTHER): Payer: Medicare HMO | Admitting: Endocrinology

## 2019-08-12 ENCOUNTER — Encounter: Payer: Self-pay | Admitting: Endocrinology

## 2019-08-12 ENCOUNTER — Other Ambulatory Visit: Payer: Self-pay

## 2019-08-12 VITALS — BP 120/64 | HR 82 | Ht 68.0 in | Wt 275.0 lb

## 2019-08-12 DIAGNOSIS — E1142 Type 2 diabetes mellitus with diabetic polyneuropathy: Secondary | ICD-10-CM | POA: Diagnosis not present

## 2019-08-12 DIAGNOSIS — Z23 Encounter for immunization: Secondary | ICD-10-CM | POA: Diagnosis not present

## 2019-08-12 DIAGNOSIS — IMO0002 Reserved for concepts with insufficient information to code with codable children: Secondary | ICD-10-CM

## 2019-08-12 DIAGNOSIS — E1165 Type 2 diabetes mellitus with hyperglycemia: Secondary | ICD-10-CM | POA: Diagnosis not present

## 2019-08-12 LAB — POCT GLYCOSYLATED HEMOGLOBIN (HGB A1C): Hemoglobin A1C: 7.6 % — AB (ref 4.0–5.6)

## 2019-08-12 NOTE — Progress Notes (Signed)
Subjective:    Patient ID: Rachel Holmes, female    DOB: 1961-04-22, 58 y.o.   MRN: [id number]  HPI Pt returns for f/u of diabetes mellitus: DM type: Insulin-requiring type 2 Dx'ed: 1988, during a pregnancy, but it persisted after Complications: polyneuropathy, renal failure, leg ulcer, left BKA, PAD, and PDR.   Therapy: insulin since 3557, and trulicity DKA: never Severe hypoglycemia: 2 episodes (both many years ago).   Pancreatitis: never Pancreatic imaging: normal on 2008 [name].   Other: a trial off insulin in early 2020 was unsuccessful; renal failure limits rx options; she does not need basal insulin.  Interval history: no cbg record, but pt states cbg's vary from 90-209.  It is in general higher as the day goes on.  She says she seldom misses the insulin.   Past Medical History:  Diagnosis Date  . Allergic rhinitis   . Allergy   . Anemia   . Anxiety   . Arthritis   . Back pain   . Bradycardia   . Breast mass    Patient can no longer palpate specific masses but showed tech general area of concern  . CHF (congestive heart failure) ([place])   . CKD (chronic kidney disease)    STAGE 3  . Constipation   . Diabetes mellitus without complication ([place])   . Diabetic nephropathy ([place])   . Dyspnea   . GERD (gastroesophageal reflux disease)   . Heart murmur   . HLD (hyperlipidemia)   . Hyperparathyroidism ([place])   . Hypertension   . Joint pain   . Leg edema   . Legally blind in left eye, as defined in [place]   . Lymphedema   . Obesity   . Onychomycosis     Past Surgical History:  Procedure Laterality Date  . ABDOMINAL HYSTERECTOMY    . AMPUTATION Left 05/05/2019   Procedure: AMPUTATION BELOW KNEE;  Surgeon: Katha Cabal, MD;  Location: [facility] ORS;  Service: Vascular;  Laterality: Left;  . BREAST BIOPSY Left 2014   FNA 12:00 position - Negative  . EYE SURGERY Left 2007   removed a lens, no lens implanted  . IR FLUORO GUIDE CV LINE RIGHT  12/29/2018  . IR REMOVAL TUN CV  CATH W/O FL  02/19/2019  . IR US GUIDE VASC ACCESS RIGHT  12/29/2018  . TEE WITHOUT CARDIOVERSION N/A 12/28/2018   Procedure: TRANSESOPHAGEAL ECHOCARDIOGRAM (TEE);  Surgeon: Sueanne Margarita, MD;  [facility] ENDOSCOPY;  Service: Cardiovascular;  Laterality: N/A;  . VAGINAL HYSTERECTOMY     abdominal hyst, not a vaginal hyst    Social History   Socioeconomic History  . Marital status: Divorced    Spouse name: Not on file  . Number of children: 1  . Years of education: Not on file  . Highest education level: Not on file  Occupational History  . Occupation: Glass blower/designer    Comment: disability  Social Needs  . Financial resource strain: Not on file  . Food insecurity    Worry: Not on file    Inability: Not on file  . Transportation needs    Medical: Not on file    Non-medical: Not on file  Tobacco Use  . Smoking status: Never Smoker  . Smokeless tobacco: Never Used  Substance and Sexual Activity  . Alcohol use: No  . Drug use: Never  . Sexual activity: Not on file  Lifestyle  . Physical activity    Days per week: Not on file  Minutes per session: Not on file  . Stress: Not on file  Relationships  . Social Herbalist on phone: Not on file    Gets together: Not on file    Attends religious service: Not on file    Active member of club or organization: Not on file    Attends meetings of clubs or organizations: Not on file    Relationship status: Not on file  . Intimate partner violence    Fear of current or ex partner: Not on file    Emotionally abused: Not on file    Physically abused: Not on file    Forced sexual activity: Not on file  Other Topics Concern  . Not on file  Social History Narrative   ** Merged History Encounter **    on disability    Current Outpatient Medications on File Prior to Visit  Medication Sig Dispense Refill  . albuterol (PROVENTIL HFA;VENTOLIN HFA) 108 (90 Base) MCG/ACT inhaler Inhale 1-2 puffs into the lungs every 6  (six) hours as needed for wheezing or shortness of breath.    [name] amLODipine (NORVASC) 10 MG tablet Take 10 mg by mouth daily.    [name] aspirin EC 81 MG EC tablet Take 1 tablet (81 mg total) by mouth daily.    . Blood Glucose Monitoring Suppl (ONE TOUCH ULTRA 2) w/Device KIT 1 Device by Does not apply route daily. 1 each 0  . clobetasol cream (TEMOVATE) 2.53 % Apply 1 application topically 2 (two) times daily.    . cloNIDine (CATAPRES) 0.3 MG tablet Take 0.3 mg by mouth 2 (two) times daily.    [name] docusate sodium (COLACE) 100 MG capsule Take 1 capsule (100 mg total) by mouth 2 (two) times daily. 10 capsule 0  . Dulaglutide (TRULICITY) 1.5 GU/4.4IH SOPN Inject 1.5 mg into the skin once a week. (Patient taking differently: Inject 1.5 mg into the skin once a week. ) 4 pen 11  . DULoxetine (CYMBALTA) 30 MG capsule Take 30 mg by mouth daily.     . Ensure Max Protein (ENSURE MAX PROTEIN) LIQD Take 330 mLs (11 oz total) by mouth 2 (two) times daily.    . fluticasone (FLONASE) 50 MCG/ACT nasal spray Place 1 spray into both nostrils daily.     [name] gabapentin (NEURONTIN) 600 MG tablet Take 600 mg by mouth at bedtime.    [name] glucosamine-chondroitin 500-400 MG tablet Take 1 tablet by mouth 2 (two) times daily.    [name] glucose blood (ONETOUCH VERIO) test strip 1 each by Other route 2 (two) times a day. Use to monitor glucose levels BID; E11.42 100 each 3  . insulin aspart (NOVOLOG FLEXPEN) 100 UNIT/ML FlexPen 3 times a day (just before each meal), 13-17-15 units. 25 pen 3  . Insulin Pen Needle (BD PEN NEEDLE MICRO U/F) 32G X 6 MM MISC 1 each by Does not apply route 3 (three) times daily. 100 each 3  . isosorbide mononitrate (IMDUR) 30 MG 24 hr tablet Take 1 tablet (30 mg total) by mouth 2 (two) times daily. 180 tablet 3  . Lancets (ONETOUCH ULTRASOFT) lancets Used to check blood sugars four times daily. 200 each 12  . Multiple Vitamin (MULTIVITAMIN WITH MINERALS) TABS tablet Take 1 tablet by mouth daily.    [name] omeprazole  (PRILOSEC) 40 MG capsule Take 40 mg by mouth 2 (two) times daily.    . ondansetron (ZOFRAN) 4 MG tablet Take 4 mg by mouth every 8 (eight)  hours as needed for nausea or vomiting.    . sodium bicarbonate 650 MG tablet Take 1 tablet (650 mg total) by mouth 2 (two) times daily. 60 tablet 0  . torsemide (DEMADEX) 20 MG tablet Take 1 tablet (20 mg total) by mouth 2 (two) times daily. Take an extra 54m tablet daily prn in the morning for swelling or sob.    . traMADol (ULTRAM) 50 MG tablet One to Two Tabs Every Six Hours As Needed For Pain 50 tablet 0  . vitamin B-12 (CYANOCOBALAMIN) 1000 MCG tablet Take 1,000 mcg by mouth daily.    . vitamin C (VITAMIN C) 250 MG tablet Take 1 tablet (250 mg total) by mouth 2 (two) times daily.    . Continuous Blood Gluc Receiver (FREESTYLE LIBRE 14 DAY READER) DEVI 1 each by Does not apply route See admin instructions. For continuous glucose monitoring; E11.42 (Patient not taking: Reported on 08/12/2019) 1 each 0  . Continuous Blood Gluc Sensor (FREESTYLE LIBRE 14 DAY SENSOR) MISC 1 Device by Does not apply route every 14 (fourteen) days. (Patient not taking: Reported on 08/12/2019) 6 each 3   No current facility-administered medications on file prior to visit.     Allergies  Allergen Reactions  . Statins Shortness Of Breath and Other (See Comments)    Wheezing, short of breath  . Penicillins Hives and Other (See Comments)    Has patient had a PCN reaction causing immediate rash, facial/tongue/throat swelling, SOB or lightheadedness with hypotension: No Has patient had a PCN reaction causing severe rash involving mucus membranes or skin necrosis: No Has patient had a PCN reaction that required hospitalization No Has patient had a PCN reaction occurring within the last 10 years: No If all of the above answers are "NO", then may proceed with Cephalosporin use.  . Codeine Nausea Only  . Ibuprofen Other (See Comments)    Reaction:  Raises pts BP  . Percocet  [Oxycodone-Acetaminophen] Nausea Only  . Tramadol Nausea Only and Other (See Comments)    Can take if she has eaten. Currently takes with good results   . Vicodin [Hydrocodone-Acetaminophen] Itching and Nausea Only    Family History  Problem Relation Age of Onset  . Breast cancer Sister 587 . Diabetes Sister   . Diabetes Mother   . Hypertension Mother   . Hyperlipidemia Mother   . Eating disorder Mother   . Obesity Mother     BP 120/64 (BP Location: Left Wrist, Patient Position: Sitting, Cuff Size: Large)   Pulse 82   Ht 5' 8"  (1.727 m)   Wt 275 lb (124.7 kg)   SpO2 96%   BMI 41.81 kg/m    Review of Systems She denies hypoglycemia    Objective:   Physical Exam VITAL SIGNS:  See vs page GENERAL: no distress.  In wheelchair Pulses: right dorsalis pedis is intact. MSK: no deformity of the right foot CV: trace right leg edema.   Skin:  no ulcer on the right foot.  normal color and temp on the right foot Neuro: sensation is intact to touch on the right foot Ext: there is onychomycosis of the right foot toenails.  Left BKA   Lab Results  Component Value Date   CREATININE 3.25 (H) 05/28/2019   BUN 66 (H) 05/28/2019   NA 136 05/28/2019   K 4.3 05/28/2019   CL 103 05/28/2019   CO2 20 (L) 05/28/2019   Lab Results  Component Value Date   HGBA1C 7.6 (  A) 08/12/2019       Assessment & Plan:  Insulin-requiring type 2 DM, with PAD: worse Renal failure: in this setting, she does not need basal insulin.   Patient Instructions  A different type of diabetes blood test is requested for you today.  We'll let you know about the results.  Please continue the same Trulicity. check your blood sugar twice a day.  vary the time of day when you check, between before the 3 meals, and at bedtime.  also check if you have symptoms of your blood sugar being too high or too low.  please keep a record of the readings and bring it to your next appointment here (or you can bring the meter  itself).  You can write it on any piece of paper.  please call us sooner if your blood sugar goes below 70, or if you have a lot of readings over 200.  Please come back for a follow-up appointment in 2 months.

## 2019-08-12 NOTE — Patient Instructions (Addendum)
A different type of diabetes blood test is requested for you today.  We'll let you know about the results.  Please continue the same Trulicity. check your blood sugar twice a day.  vary the time of day when you check, between before the 3 meals, and at bedtime.  also check if you have symptoms of your blood sugar being too high or too low.  please keep a record of the readings and bring it to your next appointment here (or you can bring the meter itself).  You can write it on any piece of paper.  please call us sooner if your blood sugar goes below 70, or if you have a lot of readings over 200.  Please come back for a follow-up appointment in 2 months.

## 2019-08-16 ENCOUNTER — Ambulatory Visit: Payer: Medicare HMO

## 2019-08-16 ENCOUNTER — Other Ambulatory Visit: Payer: Self-pay

## 2019-08-16 DIAGNOSIS — M6281 Muscle weakness (generalized): Secondary | ICD-10-CM

## 2019-08-16 DIAGNOSIS — R2681 Unsteadiness on feet: Secondary | ICD-10-CM

## 2019-08-16 DIAGNOSIS — R2689 Other abnormalities of gait and mobility: Secondary | ICD-10-CM

## 2019-08-16 NOTE — Therapy (Signed)
[place] MAIN [facility] SERVICES [address] [place], [place], [address] Phone: [phone]   Fax:  [phone]  Physical Therapy Treatment  Patient Details  Name: Rachel Holmes MRN: [id number] Date of Birth: Jun 05, 1961 Referring Provider (PT): Eulogio Ditch    Encounter Date: 08/16/2019  PT End of Session - 08/16/19 1231    Visit Number  2    Number of Visits  16    Date for PT Re-Evaluation  10/06/19    Authorization Type  2/10 eval 08/11/19    PT Start Time  0904    PT Stop Time  0945    PT Time Calculation (min)  41 min    Equipment Utilized During Treatment  Gait belt;Other (comment)   L prosthesis   Activity Tolerance  Patient tolerated treatment well;Patient limited by fatigue    Behavior During Therapy  [facility] for tasks assessed/performed       Past Medical History:  Diagnosis Date  . Allergic rhinitis   . Allergy   . Anemia   . Anxiety   . Arthritis   . Back pain   . Bradycardia   . Breast mass    Patient can no longer palpate specific masses but showed tech general area of concern  . CHF (congestive heart failure) ([place])   . CKD (chronic kidney disease)    STAGE 3  . Constipation   . Diabetes mellitus without complication ([place])   . Diabetic nephropathy ([place])   . Dyspnea   . GERD (gastroesophageal reflux disease)   . Heart murmur   . HLD (hyperlipidemia)   . Hyperparathyroidism ([place])   . Hypertension   . Joint pain   . Leg edema   . Legally blind in left eye, as defined in [place]   . Lymphedema   . Obesity   . Onychomycosis     Past Surgical History:  Procedure Laterality Date  . ABDOMINAL HYSTERECTOMY    . AMPUTATION Left 05/05/2019   Procedure: AMPUTATION BELOW KNEE;  Surgeon: Katha Cabal, MD;  Location: [facility] ORS;  Service: Vascular;  Laterality: Left;  . BREAST BIOPSY Left 2014   FNA 12:00 position - Negative  . EYE SURGERY Left 2007   removed a lens, no lens implanted  . IR FLUORO GUIDE CV LINE  RIGHT  12/29/2018  . IR REMOVAL TUN CV CATH W/O FL  02/19/2019  . IR US GUIDE VASC ACCESS RIGHT  12/29/2018  . TEE WITHOUT CARDIOVERSION N/A 12/28/2018   Procedure: TRANSESOPHAGEAL ECHOCARDIOGRAM (TEE);  Surgeon: Sueanne Margarita, MD;  Location: [facility] ENDOSCOPY;  Service: Cardiovascular;  Laterality: N/A;  . VAGINAL HYSTERECTOMY     abdominal hyst, not a vaginal hyst    There were no vitals filed for this visit.  Subjective Assessment - 08/16/19 0920    Subjective  Patient reports compliance with HEP, has been working on her supine and donning/doffing prosthesis. Is wearing it 3 hours a day.    Pertinent History  Patient is a pleasant 58 year old female who presents for gait training/ stability for L BKA. Recently obtained her prosthesis last Friday 08/06/19 and has not worn it since the prosthetists office. PMH includes anemia, anxiety, arthritis, back pain, bradycardia, CHF, CKD stage II, DM, diabetic nephropathy, dyspnea, GERD, HLD, hyperparathyroidism, hypotension, legally blind in left eye, lymphedema, obesity, onychomycosis. Left BKA performed 05/05/19, noted in history that patient had a fall after procedure injuring the anterior portion of the suture line. Currently using  a manual chair to get around the home. At this time she can't get into bathroom due to wheelchair, is waiting for a handicap apartment.    Limitations  Lifting;Standing;Walking;House hold activities    How long can you sit comfortably?  n/a    How long can you stand comfortably?  only stood once with prosthetist    How long can you walk comfortably?  not able to walk yet.    Patient Stated Goals  to walk again with prosthesis    Currently in Pain?  Yes    Pain Score  6    R knee pain 5-8 with weightbearing   Pain Orientation  Right    Pain Descriptors / Indicators  Aching    Pain Type  Chronic pain    Pain Onset  More than a month ago    Pain Frequency  Intermittent         Treatment: Socks: Patient assisted in donning  1 ply sock due to internal rotation of prosthesis in standing indicating improper fit.   In // bars:  Weight shifts in // bars : hip bump; verbal cueing for use of hips/pelvis for weight shift rather than trunk x 33minutes  Step forward and back 8x each LE, BUE support: focus on weight shift prior to step for gait normalization   Ambulate 2x length of // bars, increase to 4x length second trial for a total of 6x length of // bars. Cueing for weight shift, body mechanics, and safety.   Standing marches with focus on LE movement, control of prosthetic limb, and upright posture x 10 each side  Hip abduction 10x each LE BUE support - terminated due to R knee pain.   seated: ice on R knee Straight leg: abduction/adduction 10x.  GTB adduction 15x against PT resistance LLE GTB resisted marches 15x GTB resisted hamstring curl 15x        Pt educated throughout session about proper posture and technique with exercises. Improved exercise technique, movement at target joints, use of target muscles after min to mod verbal, visual, tactile cues              PT Education - 08/16/19 1230    Education Details  exercise technique, sock wear, need for R brace    Person(s) Educated  Patient    Methods  Explanation;Demonstration;Tactile cues;Verbal cues    Comprehension  Verbalized understanding;Returned demonstration;Verbal cues required;Tactile cues required       PT Short Term Goals - 08/11/19 1729      PT SHORT TERM GOAL #1   Title  Patient will be independent in home exercise program to improve strength/mobility for better functional independence with ADLs.    Baseline  10/7: HEP given    Time  2    Period  Weeks    Status  New    Target Date  08/25/19        PT Long Term Goals - 08/11/19 1730      PT LONG TERM GOAL #1   Title  Patient will tolerate static standing for > 5 minutes for standing ADL performance with weight acceptance onto prosthetic limb to improve quality of  life and mobility.    Baseline  10/7: 1 minute 39 seconds with heavy UE support on RW    Time  8    Period  Weeks    Status  New    Target Date  10/06/19      PT LONG TERM GOAL #  2   Title  Patient will ambulate >100 feet with RW, mod I with right prosthetic exhibiting reciprocal gait pattern with good  management of prosthetic and good safety awareness for improved home ambulation    Baseline  10/7: unable to ambulate    Time  8    Period  Weeks    Status  New    Target Date  10/06/19      PT LONG TERM GOAL #3   Title  Patient (< 49 years old) will complete five times sit to stand test in < 10 seconds indicating an increased LE strength and improved balance.    Baseline  10/7: 21 seconds with heavy BUE support from raised surface    Time  8    Period  Weeks    Status  New    Target Date  10/06/19      PT LONG TERM GOAL #4   Title  Patient will increase BLE gross strength to 4+/5 as to improve functional strength for independent gait, increased standing tolerance and increased ADL ability.    Baseline  10/7: L 3+/5 R 4-/5    Time  8    Period  Weeks    Status  New    Target Date  10/06/19            Plan - 08/16/19 1232    Clinical Impression Statement  Patient presents to physical therapy with good motivation. She is limited in standing due to pain in R knee requiring termination of standing interventions. Patient educated on need for R knee brace, proper sock wear, and safety awareness. Patient will benefit from skilled physical therapy for ambulation and transfers with prosthesis, strength and stability, and increase stability with functional mobility for quality of life and decreased falls risk    Personal Factors and Comorbidities  Age;Comorbidity 3+;Education;Finances;Fitness;Past/Current Experience;Social Background;Time since onset of injury/illness/exacerbation;Other    Comorbidities  anemia, anxiety, arthritis, back pain, bradycardia, CHF, CKD stage III, DM, diabetic  nephropathy, dyspnea, GERD, heart murmer, HLD, hyperparathyroidism, HTN, blind in L eye, obesity, oncychomycosis    Examination-Activity Limitations  Bathing;Bed Mobility;Caring for Others;Bend;Carry;Dressing;Continence;Hygiene/Grooming;Stairs;Squat;Reach Overhead;Locomotion Level;Lift;Stand;Toileting;Transfers    Examination-Participation Restrictions  Church;Cleaning;Community Activity;Interpersonal Relationship;Driving;Laundry;Volunteer;Shop;Personal Finances;Meal Prep;Yard Work;Other    Stability/Clinical Decision Making  Evolving/Moderate complexity    Rehab Potential  Fair    PT Frequency  2x / week    PT Duration  8 weeks    PT Treatment/Interventions  ADLs/Self Care Home Management;Aquatic Therapy;Biofeedback;Cryotherapy;Electrical Stimulation;Iontophoresis 4mg /ml Dexamethasone;Moist Heat;Traction;Ultrasound;Therapeutic activities;Functional mobility training;Stair training;Gait training;DME Instruction;Therapeutic exercise;Balance training;Neuromuscular re-education;Patient/family education;Manual techniques;Wheelchair Tax adviser;Compression bandaging;Scar mobilization;Passive range of motion;Dry needling;Energy conservation;Splinting;Taping    PT Next Visit Plan  ambulate in // bars    PT Home Exercise Plan  prone 10 minutes a day, wear prosthesis 1 hour a day, side lie posterior kick backs 10x    Consulted and Agree with Plan of Care  Patient       Patient will benefit from skilled therapeutic intervention in order to improve the following deficits and impairments:  Abnormal gait, Cardiopulmonary status limiting activity, Decreased activity tolerance, Decreased balance, Decreased knowledge of precautions, Decreased endurance, Decreased coordination, Decreased knowledge of use of DME, Decreased mobility, Decreased range of motion, Decreased safety awareness, Difficulty walking, Decreased strength, Hypomobility, Impaired flexibility, Impaired perceived functional  ability, Impaired sensation, Obesity, Prosthetic Dependency, Postural dysfunction, Improper body mechanics, Pain  Visit Diagnosis: Other abnormalities of gait and mobility  Unsteadiness on feet  Muscle weakness (generalized)  Problem List Patient Active Problem List   Diagnosis Date Noted  . Chronic pain syndrome 07/13/2019  . Wound dehiscence 05/29/2019  . Hx of BKA, left ([place]) 05/24/2019  . PVD (peripheral vascular disease) ([place]) 05/13/2019  . Chronic osteomyelitis of ankle and foot, left ([place]) 05/05/2019  . Chronic diastolic CHF (congestive heart failure) ([place]) 04/19/2019  . Leg edema 04/19/2019  . Acute bacterial endocarditis 04/19/2019  . Chest pain of uncertain etiology [id number]  . Ulcer of left ankle ([place]) 04/14/2019  . Ankle deformity, left 01/21/2019  . Diabetic polyneuropathy associated with type 2 diabetes mellitus ([place]) 01/21/2019  . Aortic valve endocarditis   . Bacteremia due to Streptococcus 12/24/2018  . Diabetic ulcer of left ankle associated with diabetes mellitus due to underlying condition, with fat layer exposed ([place]) 11/09/2018  . Malignant hypertension (arteriolar nephrosclerosis), stage 1-4 or unspecified chronic kidney disease 07/15/2018  . Stage 4 chronic kidney disease ([place]) 07/15/2018  . Morbid obesity with BMI of 45.0-49.9, adult ([place]) 07/15/2018  . Excessive daytime sleepiness 07/15/2018  . Other fatigue 03/11/2018  . Essential hypertension 03/11/2018  . Vitamin D deficiency 03/11/2018  . Congestive heart failure ([place]) 03/11/2018  . Primary osteoarthritis of right knee 01/06/2018  . Uncontrolled type 2 diabetes mellitus with polyneuropathy ([place]) 10/03/2017  . Chronic pain of right knee 10/02/2017  . Obesity 10/02/2017  . Charcot's joint of left foot 08/29/2017  . Lymphedema 09/06/2016  . Hyponatremia with extracellular fluid depletion 02/02/2016  . Nausea with vomiting, unspecified 02/02/2016  . Closed nondisplaced fracture of right  patella 08/23/2015  . Acute cystitis without hematuria 08/14/2014  . Colitis 08/14/2014  . HTN (hypertension), malignant 08/14/2014  . Acute renal failure superimposed on stage 3 chronic kidney disease ([place]) 06/26/2014  . Hyperparathyroidism, unspecified ([place]) 02/24/2014  . Allergic rhinitis 02/22/2014  . Onychomycosis 02/22/2014  . Anxiety 11/05/2013  . Bradycardia 05/14/2013  . Gastro-esophageal reflux disease without esophagitis 04/05/2013  . Anemia in other chronic diseases classified elsewhere 01/27/2013   Janna Arch, PT, DPT   08/16/2019, 12:33 PM  [place] MAIN [facility] SERVICES [address] [place], [place], [address] Phone: [phone]   Fax:  [phone]  Name: Rachel Holmes MRN: [id number] Date of Birth: Jul 01, 1961

## 2019-08-17 LAB — FRUCTOSAMINE: Fructosamine: 381 umol/L — ABNORMAL HIGH (ref 205–285)

## 2019-08-18 ENCOUNTER — Ambulatory Visit: Payer: Medicare HMO

## 2019-08-18 ENCOUNTER — Other Ambulatory Visit: Payer: Self-pay | Admitting: Endocrinology

## 2019-08-18 ENCOUNTER — Other Ambulatory Visit: Payer: Self-pay

## 2019-08-18 DIAGNOSIS — R2689 Other abnormalities of gait and mobility: Secondary | ICD-10-CM

## 2019-08-18 DIAGNOSIS — M6281 Muscle weakness (generalized): Secondary | ICD-10-CM

## 2019-08-18 DIAGNOSIS — R2681 Unsteadiness on feet: Secondary | ICD-10-CM

## 2019-08-18 MED ORDER — NOVOLOG FLEXPEN 100 UNIT/ML ~~LOC~~ SOPN: PEN_INJECTOR | SUBCUTANEOUS | 3 refills | Status: DC

## 2019-08-18 NOTE — Therapy (Signed)
[place] MAIN [facility] SERVICES [address] [place], [place], [address] Phone: [phone]   Fax:  [phone]  Physical Therapy Treatment  Patient Details  Name: Rachel Holmes MRN: [id number] Date of Birth: 04/29/1961 Referring Provider (PT): Eulogio Ditch    Encounter Date: 08/18/2019  PT End of Session - 08/18/19 0942    Visit Number  3    Number of Visits  16    Date for PT Re-Evaluation  10/06/19    Authorization Type  3/10 eval 08/11/19    PT Start Time  0850    PT Stop Time  0932    PT Time Calculation (min)  42 min    Equipment Utilized During Treatment  Gait belt;Other (comment)   L prosthesis   Activity Tolerance  Patient tolerated treatment well;Patient limited by fatigue    Behavior During Therapy  [facility] for tasks assessed/performed       Past Medical History:  Diagnosis Date  . Allergic rhinitis   . Allergy   . Anemia   . Anxiety   . Arthritis   . Back pain   . Bradycardia   . Breast mass    Patient can no longer palpate specific masses but showed tech general area of concern  . CHF (congestive heart failure) ([place])   . CKD (chronic kidney disease)    STAGE 3  . Constipation   . Diabetes mellitus without complication ([place])   . Diabetic nephropathy ([place])   . Dyspnea   . GERD (gastroesophageal reflux disease)   . Heart murmur   . HLD (hyperlipidemia)   . Hyperparathyroidism ([place])   . Hypertension   . Joint pain   . Leg edema   . Legally blind in left eye, as defined in [place]   . Lymphedema   . Obesity   . Onychomycosis     Past Surgical History:  Procedure Laterality Date  . ABDOMINAL HYSTERECTOMY    . AMPUTATION Left 05/05/2019   Procedure: AMPUTATION BELOW KNEE;  Surgeon: Katha Cabal, MD;  Location: [facility] ORS;  Service: Vascular;  Laterality: Left;  . BREAST BIOPSY Left 2014   FNA 12:00 position - Negative  . EYE SURGERY Left 2007   removed a lens, no lens implanted  . IR FLUORO GUIDE CV LINE  RIGHT  12/29/2018  . IR REMOVAL TUN CV CATH W/O FL  02/19/2019  . IR US GUIDE VASC ACCESS RIGHT  12/29/2018  . TEE WITHOUT CARDIOVERSION N/A 12/28/2018   Procedure: TRANSESOPHAGEAL ECHOCARDIOGRAM (TEE);  Surgeon: Sueanne Margarita, MD;  Location: [facility] ENDOSCOPY;  Service: Cardiovascular;  Laterality: N/A;  . VAGINAL HYSTERECTOMY     abdominal hyst, not a vaginal hyst    There were no vitals filed for this visit.  Subjective Assessment - 08/18/19 0938    Subjective  Patient has been compliant with HEP, has donned quipment prior to PT session. No falls or LOB since last session.    Pertinent History  Patient is a pleasant 58 year old female who presents for gait training/ stability for L BKA. Recently obtained her prosthesis last Friday 08/06/19 and has not worn it since the prosthetists office. PMH includes anemia, anxiety, arthritis, back pain, bradycardia, CHF, CKD stage II, DM, diabetic nephropathy, dyspnea, GERD, HLD, hyperparathyroidism, hypotension, legally blind in left eye, lymphedema, obesity, onychomycosis. Left BKA performed 05/05/19, noted in history that patient had a fall after procedure injuring the anterior portion of the suture line. Currently using a  manual chair to get around the home. At this time she can't get into bathroom due to wheelchair, is waiting for a handicap apartment.    Limitations  Lifting;Standing;Walking;House hold activities    How long can you sit comfortably?  n/a    How long can you stand comfortably?  only stood once with prosthetist    How long can you walk comfortably?  not able to walk yet.    Patient Stated Goals  to walk again with prosthesis    Currently in Pain?  Yes    Pain Score  6     Pain Location  Knee    Pain Orientation  Right    Pain Descriptors / Indicators  Aching    Pain Type  Chronic pain    Pain Onset  More than a month ago    Pain Frequency  Intermittent    Aggravating Factors   weightbearing    Pain Relieving Factors  rest, ice           Treatment: Socks: Patient assisted in donning 1 ply sock due to internal rotation of prosthesis in standing indicating improper fit.    In // bars:  Weight shifts in // bars : hip bump; verbal cueing for use of hips/pelvis for weight shift rather than trunk x 61minutes   Step forward and back 8x each LE, BUE support: focus on weight shift prior to step for gait normalization    Ambulate 2x length of // bars, increase to 4x length second trial for a total of 6x length of // bars. Cueing for weight shift, body mechanics, and safety.   Ambulate forwards then backwards each length of bars, CGA, cueing for upright posture with focus on weight shift  RLE on yellow dyna disc for weight shift to prosthetic LLE 60 seconds finger tip support   4" step toe taps, cueing for hip flexion prior to tap for prosthesis for optimal body mechanics, 8x each LE  seated: ice on R knee Straight leg: abduction/adduction 10x.  GTB adduction 15x against PT resistance LLE GTB resisted marches 15x Gluteal squeeze 10x 3 second holds  Chopat made from prewrap implemented to R knee for pain reduction.     Pt educated throughout session about proper posture and technique with exercises. Improved exercise technique, movement at target joints, use of target muscles after min to mod verbal, visual, tactile cues                      PT Education - 08/18/19 0939    Education Details  exercise technique, ambulation, weight acceptance    Person(s) Educated  Patient    Methods  Explanation;Demonstration;Tactile cues;Verbal cues    Comprehension  Verbalized understanding;Returned demonstration;Verbal cues required;Tactile cues required       PT Short Term Goals - 08/11/19 1729      PT SHORT TERM GOAL #1   Title  Patient will be independent in home exercise program to improve strength/mobility for better functional independence with ADLs.    Baseline  10/7: HEP given    Time  2    Period   Weeks    Status  New    Target Date  08/25/19        PT Long Term Goals - 08/11/19 1730      PT LONG TERM GOAL #1   Title  Patient will tolerate static standing for > 5 minutes for standing ADL performance with weight acceptance onto prosthetic  limb to improve quality of life and mobility.    Baseline  10/7: 1 minute 39 seconds with heavy UE support on RW    Time  8    Period  Weeks    Status  New    Target Date  10/06/19      PT LONG TERM GOAL #2   Title  Patient will ambulate >100 feet with RW, mod I with right prosthetic exhibiting reciprocal gait pattern with good  management of prosthetic and good safety awareness for improved home ambulation    Baseline  10/7: unable to ambulate    Time  8    Period  Weeks    Status  New    Target Date  10/06/19      PT LONG TERM GOAL #3   Title  Patient (< 90 years old) will complete five times sit to stand test in < 10 seconds indicating an increased LE strength and improved balance.    Baseline  10/7: 21 seconds with heavy BUE support from raised surface    Time  8    Period  Weeks    Status  New    Target Date  10/06/19      PT LONG TERM GOAL #4   Title  Patient will increase BLE gross strength to 4+/5 as to improve functional strength for independent gait, increased standing tolerance and increased ADL ability.    Baseline  10/7: L 3+/5 R 4-/5    Time  8    Period  Weeks    Status  New    Target Date  10/06/19            Plan - 08/18/19 1223    Clinical Impression Statement  Patient is progressing with functional ambulation within // bars however is limited by R knee pain rather than prosthetic acceptance. Patient will benefit from continued progression towards use of an AD for functional mobility within home. Patient will benefit from skilled physical therapy for ambulation and transfers with prosthesis, strength and stability, and increase stability with functional mobility for quality of life and decreased falls risk     Personal Factors and Comorbidities  Age;Comorbidity 3+;Education;Finances;Fitness;Past/Current Experience;Social Background;Time since onset of injury/illness/exacerbation;Other    Comorbidities  anemia, anxiety, arthritis, back pain, bradycardia, CHF, CKD stage III, DM, diabetic nephropathy, dyspnea, GERD, heart murmer, HLD, hyperparathyroidism, HTN, blind in L eye, obesity, oncychomycosis    Examination-Activity Limitations  Bathing;Bed Mobility;Caring for Others;Bend;Carry;Dressing;Continence;Hygiene/Grooming;Stairs;Squat;Reach Overhead;Locomotion Level;Lift;Stand;Toileting;Transfers    Examination-Participation Restrictions  Church;Cleaning;Community Activity;Interpersonal Relationship;Driving;Laundry;Volunteer;Shop;Personal Finances;Meal Prep;Yard Work;Other    Stability/Clinical Decision Making  Evolving/Moderate complexity    Rehab Potential  Fair    PT Frequency  2x / week    PT Duration  8 weeks    PT Treatment/Interventions  ADLs/Self Care Home Management;Aquatic Therapy;Biofeedback;Cryotherapy;Electrical Stimulation;Iontophoresis 4mg /ml Dexamethasone;Moist Heat;Traction;Ultrasound;Therapeutic activities;Functional mobility training;Stair training;Gait training;DME Instruction;Therapeutic exercise;Balance training;Neuromuscular re-education;Patient/family education;Manual techniques;Wheelchair Tax adviser;Compression bandaging;Scar mobilization;Passive range of motion;Dry needling;Energy conservation;Splinting;Taping    PT Next Visit Plan  ambulate with RW    PT Home Exercise Plan  prone 10 minutes a day, wear prosthesis 1 hour a day, side lie posterior kick backs 10x    Consulted and Agree with Plan of Care  Patient       Patient will benefit from skilled therapeutic intervention in order to improve the following deficits and impairments:  Abnormal gait, Cardiopulmonary status limiting activity, Decreased activity tolerance, Decreased balance, Decreased knowledge  of precautions, Decreased endurance, Decreased coordination, Decreased knowledge  of use of DME, Decreased mobility, Decreased range of motion, Decreased safety awareness, Difficulty walking, Decreased strength, Hypomobility, Impaired flexibility, Impaired perceived functional ability, Impaired sensation, Obesity, Prosthetic Dependency, Postural dysfunction, Improper body mechanics, Pain  Visit Diagnosis: Other abnormalities of gait and mobility  Unsteadiness on feet  Muscle weakness (generalized)     Problem List Patient Active Problem List   Diagnosis Date Noted  . Chronic pain syndrome 07/13/2019  . Wound dehiscence 05/29/2019  . Hx of BKA, left ([place]) 05/24/2019  . PVD (peripheral vascular disease) ([place]) 05/13/2019  . Chronic osteomyelitis of ankle and foot, left ([place]) 05/05/2019  . Chronic diastolic CHF (congestive heart failure) ([place]) 04/19/2019  . Leg edema 04/19/2019  . Acute bacterial endocarditis 04/19/2019  . Chest pain of uncertain etiology [id number]  . Ulcer of left ankle ([place]) 04/14/2019  . Ankle deformity, left 01/21/2019  . Diabetic polyneuropathy associated with type 2 diabetes mellitus ([place]) 01/21/2019  . Aortic valve endocarditis   . Bacteremia due to Streptococcus 12/24/2018  . Diabetic ulcer of left ankle associated with diabetes mellitus due to underlying condition, with fat layer exposed ([place]) 11/09/2018  . Malignant hypertension (arteriolar nephrosclerosis), stage 1-4 or unspecified chronic kidney disease 07/15/2018  . Stage 4 chronic kidney disease ([place]) 07/15/2018  . Morbid obesity with BMI of 45.0-49.9, adult ([place]) 07/15/2018  . Excessive daytime sleepiness 07/15/2018  . Other fatigue 03/11/2018  . Essential hypertension 03/11/2018  . Vitamin D deficiency 03/11/2018  . Congestive heart failure ([place]) 03/11/2018  . Primary osteoarthritis of right knee 01/06/2018  . Uncontrolled type 2 diabetes mellitus with polyneuropathy ([place]) 10/03/2017  . Chronic  pain of right knee 10/02/2017  . Obesity 10/02/2017  . Charcot's joint of left foot 08/29/2017  . Lymphedema 09/06/2016  . Hyponatremia with extracellular fluid depletion 02/02/2016  . Nausea with vomiting, unspecified 02/02/2016  . Closed nondisplaced fracture of right patella 08/23/2015  . Acute cystitis without hematuria 08/14/2014  . Colitis 08/14/2014  . HTN (hypertension), malignant 08/14/2014  . Acute renal failure superimposed on stage 3 chronic kidney disease ([place]) 06/26/2014  . Hyperparathyroidism, unspecified ([place]) 02/24/2014  . Allergic rhinitis 02/22/2014  . Onychomycosis 02/22/2014  . Anxiety 11/05/2013  . Bradycardia 05/14/2013  . Gastro-esophageal reflux disease without esophagitis 04/05/2013  . Anemia in other chronic diseases classified elsewhere 01/27/2013   Janna Arch, PT, DPT   08/18/2019, 12:25 PM  [place] MAIN [facility] SERVICES [address] [place], [place], [address] Phone: [phone]   Fax:  [phone]  Name: MAKYLIA RADOSEVICH MRN: [id number] Date of Birth: 1960-12-05

## 2019-08-23 ENCOUNTER — Ambulatory Visit: Payer: Medicare HMO | Admitting: Student in an Organized Health Care Education/Training Program

## 2019-08-23 ENCOUNTER — Ambulatory Visit: Payer: Medicare HMO

## 2019-08-23 ENCOUNTER — Other Ambulatory Visit: Payer: Self-pay

## 2019-08-23 DIAGNOSIS — M6281 Muscle weakness (generalized): Secondary | ICD-10-CM

## 2019-08-23 DIAGNOSIS — R2689 Other abnormalities of gait and mobility: Secondary | ICD-10-CM

## 2019-08-23 DIAGNOSIS — R2681 Unsteadiness on feet: Secondary | ICD-10-CM

## 2019-08-23 NOTE — Therapy (Signed)
[place] MAIN [facility] SERVICES [address] [place], [place], [address] Phone: [phone]   Fax:  [phone]  Physical Therapy Treatment  Patient Details  Name: Rachel Holmes MRN: [id number] Date of Birth: [birth year] Referring Provider (PT): Eulogio Ditch    Encounter Date: 08/23/2019  PT End of Session - 08/23/19 1242    Visit Number  4    Number of Visits  16    Date for PT Re-Evaluation  10/06/19    Authorization Type  4/10 eval 08/11/19    PT Start Time  0930    PT Stop Time  1014    PT Time Calculation (min)  44 min    Equipment Utilized During Treatment  Gait belt;Other (comment)   L prosthesis   Activity Tolerance  Patient tolerated treatment well;Patient limited by fatigue    Behavior During Therapy  [facility] for tasks assessed/performed       Past Medical History:  Diagnosis Date  . Allergic rhinitis   . Allergy   . Anemia   . Anxiety   . Arthritis   . Back pain   . Bradycardia   . Breast mass    Patient can no longer palpate specific masses but showed tech general area of concern  . CHF (congestive heart failure) ([place])   . CKD (chronic kidney disease)    STAGE 3  . Constipation   . Diabetes mellitus without complication ([place])   . Diabetic nephropathy ([place])   . Dyspnea   . GERD (gastroesophageal reflux disease)   . Heart murmur   . HLD (hyperlipidemia)   . Hyperparathyroidism ([place])   . Hypertension   . Joint pain   . Leg edema   . Legally blind in left eye, as defined in [place]   . Lymphedema   . Obesity   . Onychomycosis     Past Surgical History:  Procedure Laterality Date  . ABDOMINAL HYSTERECTOMY    . AMPUTATION Left 05/05/2019   Procedure: AMPUTATION BELOW KNEE;  Surgeon: Katha Cabal, MD;  Location: [facility] ORS;  Service: Vascular;  Laterality: Left;  . BREAST BIOPSY Left 2014   FNA 12:00 position - Negative  . EYE SURGERY Left 2007   removed a lens, no lens implanted  . IR FLUORO GUIDE CV LINE  RIGHT  12/29/2018  . IR REMOVAL TUN CV CATH W/O FL  02/19/2019  . IR US GUIDE VASC ACCESS RIGHT  12/29/2018  . TEE WITHOUT CARDIOVERSION N/A 12/28/2018   Procedure: TRANSESOPHAGEAL ECHOCARDIOGRAM (TEE);  Surgeon: Sueanne Margarita, MD;  Location: [facility] ENDOSCOPY;  Service: Cardiovascular;  Laterality: N/A;  . VAGINAL HYSTERECTOMY     abdominal hyst, not a vaginal hyst    There were no vitals filed for this visit.  Subjective Assessment - 08/23/19 0937    Subjective  Patient reports compliance with HEP. No falls or LOB since last session. Brought socks to don due to prosthetic feeling loose.    Pertinent History  Patient is a pleasant 58 year old female who presents for gait training/ stability for L BKA. Recently obtained her prosthesis last Friday 08/06/19 and has not worn it since the prosthetists office. PMH includes anemia, anxiety, arthritis, back pain, bradycardia, CHF, CKD stage II, DM, diabetic nephropathy, dyspnea, GERD, HLD, hyperparathyroidism, hypotension, legally blind in left eye, lymphedema, obesity, onychomycosis. Left BKA performed 05/05/19, noted in history that patient had a fall after procedure injuring the anterior portion of the suture line. Currently using  a manual chair to get around the home. At this time she can't get into bathroom due to wheelchair, is waiting for a handicap apartment.    Limitations  Lifting;Standing;Walking;House hold activities    How long can you sit comfortably?  n/a    How long can you stand comfortably?  only stood once with prosthetist    How long can you walk comfortably?  not able to walk yet.    Patient Stated Goals  to walk again with prosthesis    Currently in Pain?  Yes    Pain Score  5     Pain Location  Knee    Pain Orientation  Right    Pain Descriptors / Indicators  Aching    Pain Type  Chronic pain    Pain Onset  More than a month ago    Pain Frequency  Intermittent    Aggravating Factors   weightbearing        Treatment:      vitals: 167/76 pulse 60   standing:  ambulate 20 ft with RW and w/c follow, cueing for body mechanics, keeping feet within walker, upright position, terminated due to R knee pain at end of 20 ft.   Ambulate with RW and CGA turning around a cone 5 ft away and return to seated  X3 cueing for walker placement, not lifting walker.   Marching with RW and CGA, 2lb weight on RLE allowed reduction of pain 12x,    Ambulation with 2lb ankle weight on RLE for pain reduction, ambulate 30 ft with RW with reduced pain allowing for improved posture and weight acceptance.   Seated: ice on R knee for pain reduction   Marching 12x each leg, 3 second concentric/eccentric hold  Seated IR/ER LLE  2lb ankle weight 15x  LAQ 2lb ankle weight L and R LE 10x each LE 3 second holds  Donning: 2lb sock for proper fit  Transfer into/out of car: cueing for sequencing, hand placement, and Min A for transfer from transfer chair.           Pt educated throughout session about proper posture and technique with exercises. Improved exercise technique, movement at target joints, use of target muscles after min to mod verbal, visual, tactile cues        PT Education - 08/23/19 1242    Education Details  ambulation with RW    Person(s) Educated  Patient    Methods  Explanation;Demonstration;Tactile cues;Verbal cues    Comprehension  Verbalized understanding;Returned demonstration;Verbal cues required;Tactile cues required       PT Short Term Goals - 08/11/19 1729      PT SHORT TERM GOAL #1   Title  Patient will be independent in home exercise program to improve strength/mobility for better functional independence with ADLs.    Baseline  10/7: HEP given    Time  2    Period  Weeks    Status  New    Target Date  08/25/19        PT Long Term Goals - 08/11/19 1730      PT LONG TERM GOAL #1   Title  Patient will tolerate static standing for > 5 minutes for standing ADL performance with weight  acceptance onto prosthetic limb to improve quality of life and mobility.    Baseline  10/7: 1 minute 39 seconds with heavy UE support on RW    Time  8    Period  Weeks  Status  New    Target Date  10/06/19      PT LONG TERM GOAL #2   Title  Patient will ambulate >100 feet with RW, mod I with right prosthetic exhibiting reciprocal gait pattern with good  management of prosthetic and good safety awareness for improved home ambulation    Baseline  10/7: unable to ambulate    Time  8    Period  Weeks    Status  New    Target Date  10/06/19      PT LONG TERM GOAL #3   Title  Patient (< 70 years old) will complete five times sit to stand test in < 10 seconds indicating an increased LE strength and improved balance.    Baseline  10/7: 21 seconds with heavy BUE support from raised surface    Time  8    Period  Weeks    Status  New    Target Date  10/06/19      PT LONG TERM GOAL #4   Title  Patient will increase BLE gross strength to 4+/5 as to improve functional strength for independent gait, increased standing tolerance and increased ADL ability.    Baseline  10/7: L 3+/5 R 4-/5    Time  8    Period  Weeks    Status  New    Target Date  10/06/19            Plan - 08/23/19 1245    Clinical Impression Statement  Patient ambulated with RW and CGA for first time with prosthetic limb. Patient educated on proper use of walker for ambulation due to preference to lift walker for turning, patient educated on safety awareness and demonstrated understanding. Patient will benefit from skilled physical therapy for ambulation and transfers with prosthesis, strength and stability, and increase stability with functional mobility for quality of life and decreased falls risk    Personal Factors and Comorbidities  Age;Comorbidity 3+;Education;Finances;Fitness;Past/Current Experience;Social Background;Time since onset of injury/illness/exacerbation;Other    Comorbidities  anemia, anxiety, arthritis,  back pain, bradycardia, CHF, CKD stage III, DM, diabetic nephropathy, dyspnea, GERD, heart murmer, HLD, hyperparathyroidism, HTN, blind in L eye, obesity, oncychomycosis    Examination-Activity Limitations  Bathing;Bed Mobility;Caring for Others;Bend;Carry;Dressing;Continence;Hygiene/Grooming;Stairs;Squat;Reach Overhead;Locomotion Level;Lift;Stand;Toileting;Transfers    Examination-Participation Restrictions  Church;Cleaning;Community Activity;Interpersonal Relationship;Driving;Laundry;Volunteer;Shop;Personal Finances;Meal Prep;Yard Work;Other    Stability/Clinical Decision Making  Evolving/Moderate complexity    Rehab Potential  Fair    PT Frequency  2x / week    PT Duration  8 weeks    PT Treatment/Interventions  ADLs/Self Care Home Management;Aquatic Therapy;Biofeedback;Cryotherapy;Electrical Stimulation;Iontophoresis 4mg /ml Dexamethasone;Moist Heat;Traction;Ultrasound;Therapeutic activities;Functional mobility training;Stair training;Gait training;DME Instruction;Therapeutic exercise;Balance training;Neuromuscular re-education;Patient/family education;Manual techniques;Wheelchair Tax adviser;Compression bandaging;Scar mobilization;Passive range of motion;Dry needling;Energy conservation;Splinting;Taping    PT Next Visit Plan  ambulate with RW    PT Home Exercise Plan  prone 10 minutes a day, wear prosthesis 1 hour a day, side lie posterior kick backs 10x    Consulted and Agree with Plan of Care  Patient       Patient will benefit from skilled therapeutic intervention in order to improve the following deficits and impairments:  Abnormal gait, Cardiopulmonary status limiting activity, Decreased activity tolerance, Decreased balance, Decreased knowledge of precautions, Decreased endurance, Decreased coordination, Decreased knowledge of use of DME, Decreased mobility, Decreased range of motion, Decreased safety awareness, Difficulty walking, Decreased strength, Hypomobility,  Impaired flexibility, Impaired perceived functional ability, Impaired sensation, Obesity, Prosthetic Dependency, Postural dysfunction, Improper body mechanics, Pain  Visit Diagnosis:  Other abnormalities of gait and mobility  Unsteadiness on feet  Muscle weakness (generalized)     Problem List Patient Active Problem List   Diagnosis Date Noted  . Chronic pain syndrome 07/13/2019  . Wound dehiscence 05/29/2019  . Hx of BKA, left ([place]) 05/24/2019  . PVD (peripheral vascular disease) ([place]) 05/13/2019  . Chronic osteomyelitis of ankle and foot, left ([place]) 05/05/2019  . Chronic diastolic CHF (congestive heart failure) ([place]) 04/19/2019  . Leg edema 04/19/2019  . Acute bacterial endocarditis 04/19/2019  . Chest pain of uncertain etiology [id number]  . Ulcer of left ankle ([place]) 04/14/2019  . Ankle deformity, left 01/21/2019  . Diabetic polyneuropathy associated with type 2 diabetes mellitus ([place]) 01/21/2019  . Aortic valve endocarditis   . Bacteremia due to Streptococcus 12/24/2018  . Diabetic ulcer of left ankle associated with diabetes mellitus due to underlying condition, with fat layer exposed ([place]) 11/09/2018  . Malignant hypertension (arteriolar nephrosclerosis), stage 1-4 or unspecified chronic kidney disease 07/15/2018  . Stage 4 chronic kidney disease ([place]) 07/15/2018  . Morbid obesity with BMI of 45.0-49.9, adult ([place]) 07/15/2018  . Excessive daytime sleepiness 07/15/2018  . Other fatigue 03/11/2018  . Essential hypertension 03/11/2018  . Vitamin D deficiency 03/11/2018  . Congestive heart failure ([place]) 03/11/2018  . Primary osteoarthritis of right knee 01/06/2018  . Uncontrolled type 2 diabetes mellitus with polyneuropathy ([place]) 10/03/2017  . Chronic pain of right knee 10/02/2017  . Obesity 10/02/2017  . Charcot's joint of left foot 08/29/2017  . Lymphedema 09/06/2016  . Hyponatremia with extracellular fluid depletion 02/02/2016  . Nausea with vomiting, unspecified  02/02/2016  . Closed nondisplaced fracture of right patella 08/23/2015  . Acute cystitis without hematuria 08/14/2014  . Colitis 08/14/2014  . HTN (hypertension), malignant 08/14/2014  . Acute renal failure superimposed on stage 3 chronic kidney disease ([place]) 06/26/2014  . Hyperparathyroidism, unspecified ([place]) 02/24/2014  . Allergic rhinitis 02/22/2014  . Onychomycosis 02/22/2014  . Anxiety 11/05/2013  . Bradycardia 05/14/2013  . Gastro-esophageal reflux disease without esophagitis 04/05/2013  . Anemia in other chronic diseases classified elsewhere 01/27/2013   Janna Arch, PT, DPT   08/23/2019, 12:46 PM  [place] MAIN [facility] SERVICES [address] [place], [place], [address] Phone: [phone]   Fax:  [phone]  Name: MARSHEA RAGEN MRN: [id number] Date of Birth: [birth year]

## 2019-08-25 ENCOUNTER — Other Ambulatory Visit: Payer: Self-pay

## 2019-08-25 ENCOUNTER — Ambulatory Visit: Payer: Medicare HMO

## 2019-08-25 DIAGNOSIS — M6281 Muscle weakness (generalized): Secondary | ICD-10-CM

## 2019-08-25 DIAGNOSIS — R2681 Unsteadiness on feet: Secondary | ICD-10-CM

## 2019-08-25 DIAGNOSIS — R2689 Other abnormalities of gait and mobility: Secondary | ICD-10-CM | POA: Diagnosis not present

## 2019-08-25 NOTE — Therapy (Signed)
[place] MAIN [facility] SERVICES [address] [place], [place], [address] Phone: [phone]   Fax:  [phone]  Physical Therapy Treatment  Patient Details  Name: Rachel Holmes MRN: [id number] Date of Birth: 12/21/1960 Referring Provider (PT): Eulogio Ditch    Encounter Date: 08/25/2019  PT End of Session - 08/25/19 1245    Visit Number  5    Number of Visits  16    Date for PT Re-Evaluation  10/06/19    Authorization Type  5/10 eval 08/11/19    PT Start Time  0940    PT Stop Time  1016    PT Time Calculation (min)  36 min    Equipment Utilized During Treatment  Gait belt;Other (comment)   L prosthesis   Activity Tolerance  Patient tolerated treatment well;Patient limited by fatigue    Behavior During Therapy  [facility] for tasks assessed/performed       Past Medical History:  Diagnosis Date  . Allergic rhinitis   . Allergy   . Anemia   . Anxiety   . Arthritis   . Back pain   . Bradycardia   . Breast mass    Patient can no longer palpate specific masses but showed tech general area of concern  . CHF (congestive heart failure) ([place])   . CKD (chronic kidney disease)    STAGE 3  . Constipation   . Diabetes mellitus without complication ([place])   . Diabetic nephropathy ([place])   . Dyspnea   . GERD (gastroesophageal reflux disease)   . Heart murmur   . HLD (hyperlipidemia)   . Hyperparathyroidism ([place])   . Hypertension   . Joint pain   . Leg edema   . Legally blind in left eye, as defined in [place]   . Lymphedema   . Obesity   . Onychomycosis     Past Surgical History:  Procedure Laterality Date  . ABDOMINAL HYSTERECTOMY    . AMPUTATION Left 05/05/2019   Procedure: AMPUTATION BELOW KNEE;  Surgeon: Katha Cabal, MD;  Location: [facility] ORS;  Service: Vascular;  Laterality: Left;  . BREAST BIOPSY Left 2014   FNA 12:00 position - Negative  . EYE SURGERY Left 2007   removed a lens, no lens implanted  . IR FLUORO GUIDE CV LINE  RIGHT  12/29/2018  . IR REMOVAL TUN CV CATH W/O FL  02/19/2019  . IR US GUIDE VASC ACCESS RIGHT  12/29/2018  . TEE WITHOUT CARDIOVERSION N/A 12/28/2018   Procedure: TRANSESOPHAGEAL ECHOCARDIOGRAM (TEE);  Surgeon: Sueanne Margarita, MD;  Location: [facility] ENDOSCOPY;  Service: Cardiovascular;  Laterality: N/A;  . VAGINAL HYSTERECTOMY     abdominal hyst, not a vaginal hyst    There were no vitals filed for this visit.  Subjective Assessment - 08/25/19 1027    Subjective  Patient reports she arrived late to session due to traffic. No falls or LOB since last session. Has been practicing short distance walking since last session.    Pertinent History  Patient is a pleasant 58 year old female who presents for gait training/ stability for L BKA. Recently obtained her prosthesis last Friday 08/06/19 and has not worn it since the prosthetists office. PMH includes anemia, anxiety, arthritis, back pain, bradycardia, CHF, CKD stage II, DM, diabetic nephropathy, dyspnea, GERD, HLD, hyperparathyroidism, hypotension, legally blind in left eye, lymphedema, obesity, onychomycosis. Left BKA performed 05/05/19, noted in history that patient had a fall after procedure injuring the anterior portion of  the suture line. Currently using a manual chair to get around the home. At this time she can't get into bathroom due to wheelchair, is waiting for a handicap apartment.    Limitations  Lifting;Standing;Walking;House hold activities    How long can you sit comfortably?  n/a    How long can you stand comfortably?  only stood once with prosthetist    How long can you walk comfortably?  not able to walk yet.    Patient Stated Goals  to walk again with prosthesis    Currently in Pain?  Yes    Pain Score  4     Pain Location  Knee    Pain Orientation  Right    Pain Descriptors / Indicators  Aching    Pain Type  Chronic pain    Pain Onset  More than a month ago    Pain Frequency  Constant       Patient late for session due to  traffic. Walked yesterday at home.   donning/doffing of socks for proper fit/alignment.   Ambulate 40 ft with RW with 2lb weight on RLE for pain reduction, ambulate 55 ft, ambulate 30 ft, seated rest breaks between interventions  In // bars:  Backwards ambulation 2x length of // bars cueing for turning, moving feet first then arms. Patient needs cueing for safety with mobility  Side stepping in // bars 2x length of // bars. Cueing for upright posture for decreased reliance upon UE's.   Seated  Marching: focus on upright posture 15x each LE,  Seated IR/ER stepping over theraband on ground 15x x2 trials with second trial focu son hip flexion for arc of motion  Hamstring stretch LLE on PT leg for 60 seconds with overpressure  Standing interventions limited by pain in RLE.  Transfer into/out of car: cueing for sequencing, hand placement, and Min A for transfer from transfer chair.       Pt educated throughout session about proper posture and technique with exercises. Improved exercise technique, movement at target joints, use of target muscles after min to mod verbal, visual, tactile cues                        PT Education - 08/25/19 1245    Education Details  ambulate with prosthetic and RW    Person(s) Educated  Patient    Methods  Explanation;Demonstration;Tactile cues;Verbal cues    Comprehension  Verbalized understanding;Returned demonstration;Verbal cues required;Tactile cues required       PT Short Term Goals - 08/11/19 1729      PT SHORT TERM GOAL #1   Title  Patient will be independent in home exercise program to improve strength/mobility for better functional independence with ADLs.    Baseline  10/7: HEP given    Time  2    Period  Weeks    Status  New    Target Date  08/25/19        PT Long Term Goals - 08/11/19 1730      PT LONG TERM GOAL #1   Title  Patient will tolerate static standing for > 5 minutes for standing ADL performance with  weight acceptance onto prosthetic limb to improve quality of life and mobility.    Baseline  10/7: 1 minute 39 seconds with heavy UE support on RW    Time  8    Period  Weeks    Status  New    Target Date  10/06/19      PT LONG TERM GOAL #2   Title  Patient will ambulate >100 feet with RW, mod I with right prosthetic exhibiting reciprocal gait pattern with good  management of prosthetic and good safety awareness for improved home ambulation    Baseline  10/7: unable to ambulate    Time  8    Period  Weeks    Status  New    Target Date  10/06/19      PT LONG TERM GOAL #3   Title  Patient (< 65 years old) will complete five times sit to stand test in < 10 seconds indicating an increased LE strength and improved balance.    Baseline  10/7: 21 seconds with heavy BUE support from raised surface    Time  8    Period  Weeks    Status  New    Target Date  10/06/19      PT LONG TERM GOAL #4   Title  Patient will increase BLE gross strength to 4+/5 as to improve functional strength for independent gait, increased standing tolerance and increased ADL ability.    Baseline  10/7: L 3+/5 R 4-/5    Time  8    Period  Weeks    Status  New    Target Date  10/06/19            Plan - 08/25/19 1246    Clinical Impression Statement  Patient is progressing with ambulatory capacity and mobility with increased distance and frequency of ambulation preformed this session. She continues to be challenged with car transfers as well as stand pivot transfers due to poor safety awareness. Session limited by patient arriving late. Patient will benefit from skilled physical therapy for ambulation and transfers with prosthesis, strength and stability, and increase stability with functional mobility for quality of life and decreased falls risk    Personal Factors and Comorbidities  Age;Comorbidity 3+;Education;Finances;Fitness;Past/Current Experience;Social Background;Time since onset of  injury/illness/exacerbation;Other    Comorbidities  anemia, anxiety, arthritis, back pain, bradycardia, CHF, CKD stage III, DM, diabetic nephropathy, dyspnea, GERD, heart murmer, HLD, hyperparathyroidism, HTN, blind in L eye, obesity, oncychomycosis    Examination-Activity Limitations  Bathing;Bed Mobility;Caring for Others;Bend;Carry;Dressing;Continence;Hygiene/Grooming;Stairs;Squat;Reach Overhead;Locomotion Level;Lift;Stand;Toileting;Transfers    Examination-Participation Restrictions  Church;Cleaning;Community Activity;Interpersonal Relationship;Driving;Laundry;Volunteer;Shop;Personal Finances;Meal Prep;Yard Work;Other    Stability/Clinical Decision Making  Evolving/Moderate complexity    Rehab Potential  Fair    PT Frequency  2x / week    PT Duration  8 weeks    PT Treatment/Interventions  ADLs/Self Care Home Management;Aquatic Therapy;Biofeedback;Cryotherapy;Electrical Stimulation;Iontophoresis 4mg /ml Dexamethasone;Moist Heat;Traction;Ultrasound;Therapeutic activities;Functional mobility training;Stair training;Gait training;DME Instruction;Therapeutic exercise;Balance training;Neuromuscular re-education;Patient/family education;Manual techniques;Wheelchair Tax adviser;Compression bandaging;Scar mobilization;Passive range of motion;Dry needling;Energy conservation;Splinting;Taping    PT Next Visit Plan  ambulate with RW    PT Home Exercise Plan  prone 10 minutes a day, wear prosthesis 1 hour a day, side lie posterior kick backs 10x    Consulted and Agree with Plan of Care  Patient       Patient will benefit from skilled therapeutic intervention in order to improve the following deficits and impairments:  Abnormal gait, Cardiopulmonary status limiting activity, Decreased activity tolerance, Decreased balance, Decreased knowledge of precautions, Decreased endurance, Decreased coordination, Decreased knowledge of use of DME, Decreased mobility, Decreased range of motion,  Decreased safety awareness, Difficulty walking, Decreased strength, Hypomobility, Impaired flexibility, Impaired perceived functional ability, Impaired sensation, Obesity, Prosthetic Dependency, Postural dysfunction, Improper body mechanics, Pain  Visit Diagnosis: Other abnormalities of  gait and mobility  Unsteadiness on feet  Muscle weakness (generalized)     Problem List Patient Active Problem List   Diagnosis Date Noted  . Chronic pain syndrome 07/13/2019  . Wound dehiscence 05/29/2019  . Hx of BKA, left ([place]) 05/24/2019  . PVD (peripheral vascular disease) ([place]) 05/13/2019  . Chronic osteomyelitis of ankle and foot, left ([place]) 05/05/2019  . Chronic diastolic CHF (congestive heart failure) ([place]) 04/19/2019  . Leg edema 04/19/2019  . Acute bacterial endocarditis 04/19/2019  . Chest pain of uncertain etiology [id number]  . Ulcer of left ankle ([place]) 04/14/2019  . Ankle deformity, left 01/21/2019  . Diabetic polyneuropathy associated with type 2 diabetes mellitus ([place]) 01/21/2019  . Aortic valve endocarditis   . Bacteremia due to Streptococcus 12/24/2018  . Diabetic ulcer of left ankle associated with diabetes mellitus due to underlying condition, with fat layer exposed ([place]) 11/09/2018  . Malignant hypertension (arteriolar nephrosclerosis), stage 1-4 or unspecified chronic kidney disease 07/15/2018  . Stage 4 chronic kidney disease ([place]) 07/15/2018  . Morbid obesity with BMI of 45.0-49.9, adult ([place]) 07/15/2018  . Excessive daytime sleepiness 07/15/2018  . Other fatigue 03/11/2018  . Essential hypertension 03/11/2018  . Vitamin D deficiency 03/11/2018  . Congestive heart failure ([place]) 03/11/2018  . Primary osteoarthritis of right knee 01/06/2018  . Uncontrolled type 2 diabetes mellitus with polyneuropathy ([place]) 10/03/2017  . Chronic pain of right knee 10/02/2017  . Obesity 10/02/2017  . Charcot's joint of left foot 08/29/2017  . Lymphedema 09/06/2016  . Hyponatremia with  extracellular fluid depletion 02/02/2016  . Nausea with vomiting, unspecified 02/02/2016  . Closed nondisplaced fracture of right patella 08/23/2015  . Acute cystitis without hematuria 08/14/2014  . Colitis 08/14/2014  . HTN (hypertension), malignant 08/14/2014  . Acute renal failure superimposed on stage 3 chronic kidney disease ([place]) 06/26/2014  . Hyperparathyroidism, unspecified ([place]) 02/24/2014  . Allergic rhinitis 02/22/2014  . Onychomycosis 02/22/2014  . Anxiety 11/05/2013  . Bradycardia 05/14/2013  . Gastro-esophageal reflux disease without esophagitis 04/05/2013  . Anemia in other chronic diseases classified elsewhere 01/27/2013    Janna Arch, PT, DPT   08/25/2019, 12:47 PM  [place] MAIN [facility] SERVICES [address] [place], [place], [address] Phone: [phone]   Fax:  [phone]  Name: Rachel Holmes MRN: [id number] Date of Birth: 14-Mar-1961

## 2019-08-30 ENCOUNTER — Ambulatory Visit
Payer: Medicare HMO | Attending: Student in an Organized Health Care Education/Training Program | Admitting: Student in an Organized Health Care Education/Training Program

## 2019-08-30 ENCOUNTER — Encounter: Payer: Self-pay | Admitting: Student in an Organized Health Care Education/Training Program

## 2019-08-30 ENCOUNTER — Ambulatory Visit: Payer: Medicare HMO

## 2019-08-30 ENCOUNTER — Other Ambulatory Visit: Payer: Self-pay

## 2019-08-30 VITALS — BP 160/66 | HR 74 | Temp 97.5°F | Resp 18 | Ht 68.0 in | Wt 270.0 lb

## 2019-08-30 DIAGNOSIS — R2681 Unsteadiness on feet: Secondary | ICD-10-CM

## 2019-08-30 DIAGNOSIS — R2689 Other abnormalities of gait and mobility: Secondary | ICD-10-CM | POA: Diagnosis not present

## 2019-08-30 DIAGNOSIS — M1711 Unilateral primary osteoarthritis, right knee: Secondary | ICD-10-CM | POA: Insufficient documentation

## 2019-08-30 DIAGNOSIS — M6281 Muscle weakness (generalized): Secondary | ICD-10-CM

## 2019-08-30 MED ORDER — SODIUM HYALURONATE (VISCOSUP) 20 MG/2ML IX SOSY
2.0000 mL | PREFILLED_SYRINGE | Freq: Once | INTRA_ARTICULAR | Status: AC
  Administered 2019-08-30: 2 mL via INTRA_ARTICULAR

## 2019-08-30 MED ORDER — LIDOCAINE HCL (PF) 1 % IJ SOLN
5.0000 mL | Freq: Once | INTRAMUSCULAR | Status: AC
  Administered 2019-08-30: 5 mL

## 2019-08-30 MED ORDER — LIDOCAINE HCL 2 % IJ SOLN
INTRAMUSCULAR | Status: AC
  Filled 2019-08-30: qty 20

## 2019-08-30 NOTE — Progress Notes (Signed)
Patient's Name: Rachel Holmes  MRN: [id number]  Referring Provider: Sharyne Peach, MD  DOB: [birth year]  PCP: Sharyne Peach, MD  DOS: 08/30/2019  Note by: Gillis Santa, MD  Service setting: Ambulatory outpatient  Specialty: Interventional Pain Management  Patient type: Established  Location: [facility] (AMB) Pain Management Facility  Visit type: Interventional Procedure   Primary Reason for Visit: Interventional Pain Management Treatment. CC: Knee Pain (right)  Procedure:          Anesthesia, Analgesia, Anxiolysis:  Type: Therapeutic Intra-Articular Hyalgan Knee Injection #2  Region: Lateral infrapatellar Knee Region Level: Knee Joint Laterality: Right knee  Type: Local Anesthesia Indication(s): Analgesia         Local Anesthetic: Lidocaine 1-2% Route: Infiltration ([place]/IM) IV Access: Declined Sedation: Declined   Position: Sitting   Indications: 1. Primary osteoarthritis of right knee    Pain Score: Pre-procedure: 6 /10 Post-procedure: 0-No pain/10   Pre-op Assessment:  Rachel Holmes is a 58 y.o. (year old), female patient, seen today for interventional treatment. She  has a past surgical history that includes Abdominal hysterectomy; TEE without cardioversion (N/A, 12/28/2018); IR Fluoro Guide CV Line Right (12/29/2018); IR US Guide Vasc Access Right (12/29/2018); IR Removal Tun Cv Cath W/O FL (02/19/2019); Vaginal hysterectomy; Breast biopsy (Left, 2014); Eye surgery (Left, 2007); and Amputation (Left, 05/05/2019). Rachel Holmes has a current medication list which includes the following prescription(s): albuterol, amlodipine, aspirin, one touch ultra 2, clobetasol cream, clonidine, freestyle libre 14 day reader, freestyle libre 14 day sensor, docusate sodium, trulicity, duloxetine, ensure max protein, fluticasone, gabapentin, glucosamine-chondroitin, glucose blood, novolog flexpen, insulin pen needle, isosorbide mononitrate, onetouch ultrasoft, multivitamin with minerals, omeprazole,  ondansetron, sodium bicarbonate, torsemide, tramadol, vitamin b-12, and ascorbic acid. Her primarily concern today is the Knee Pain (right)  Initial Vital Signs:  Pulse/HCG Rate: 74  Temp: (!) 97.5 F (36.4 C) Resp: 18 BP: (!) 160/66 SpO2: 97 %  BMI: Estimated body mass index is 41.05 kg/m as calculated from the following:   Height as of this encounter: 5\' 8"  (1.727 m).   Weight as of this encounter: 270 lb (122.5 kg).  Risk Assessment: Allergies: Reviewed. She is allergic to statins; penicillins; codeine; ibuprofen; percocet [oxycodone-acetaminophen]; tramadol; and vicodin [hydrocodone-acetaminophen].  Allergy Precautions: None required Coagulopathies: Reviewed. None identified.  Blood-thinner therapy: None at this time Active Infection(s): Reviewed. None identified. Rachel Holmes is afebrile  Site Confirmation: Rachel Holmes was asked to confirm the procedure and laterality before marking the site Procedure checklist: Completed Consent: Before the procedure and under the influence of no sedative(s), amnesic(s), or anxiolytics, the patient was informed of the treatment options, risks and possible complications. To fulfill our ethical and legal obligations, as recommended by the American Medical Association's Code of Ethics, I have informed the patient of my clinical impression; the nature and purpose of the treatment or procedure; the risks, benefits, and possible complications of the intervention; the alternatives, including doing nothing; the risk(s) and benefit(s) of the alternative treatment(s) or procedure(s); and the risk(s) and benefit(s) of doing nothing. The patient was provided information about the general risks and possible complications associated with the procedure. These may include, but are not limited to: failure to achieve desired goals, infection, bleeding, organ or nerve damage, allergic reactions, paralysis, and death. In addition, the patient was informed of those risks  and complications associated to the procedure, such as failure to decrease pain; infection; bleeding; organ or nerve damage with subsequent damage to sensory, motor, and/or autonomic systems, resulting  in permanent pain, numbness, and/or weakness of one or several areas of the body; allergic reactions; (i.e.: anaphylactic reaction); and/or death. Furthermore, the patient was informed of those risks and complications associated with the medications. These include, but are not limited to: allergic reactions (i.e.: anaphylactic or anaphylactoid reaction(s)); adrenal axis suppression; blood sugar elevation that in diabetics may result in ketoacidosis or comma; water retention that in patients with history of congestive heart failure may result in shortness of breath, pulmonary edema, and decompensation with resultant heart failure; weight gain; swelling or edema; medication-induced neural toxicity; particulate matter embolism and blood vessel occlusion with resultant organ, and/or nervous system infarction; and/or aseptic necrosis of one or more joints. Finally, the patient was informed that Medicine is not an exact science; therefore, there is also the possibility of unforeseen or unpredictable risks and/or possible complications that may result in a catastrophic outcome. The patient indicated having understood very clearly. We have given the patient no guarantees and we have made no promises. Enough time was given to the patient to ask questions, all of which were answered to the patient's satisfaction. Rachel Holmes has indicated that she wanted to continue with the procedure. Attestation: I, the ordering provider, attest that I have discussed with the patient the benefits, risks, side-effects, alternatives, likelihood of achieving goals, and potential problems during recovery for the procedure that I have provided informed consent. Date  Time: 08/30/2019 11:29 AM  Pre-Procedure Preparation:  Monitoring: As  per clinic protocol. Respiration, ETCO2, SpO2, BP, heart rate and rhythm monitor placed and checked for adequate function Safety Precautions: Patient was assessed for positional comfort and pressure points before starting the procedure. Time-out: I initiated and conducted the "Time-out" before starting the procedure, as per protocol. The patient was asked to participate by confirming the accuracy of the "Time Out" information. Verification of the correct person, site, and procedure were performed and confirmed by me, the nursing staff, and the patient. "Time-out" conducted as per Joint Commission's Universal Protocol (UP.01.01.01). Time: 1151  Description of Procedure:          Target Area: Knee Joint Approach: Just above the Lateral tibial plateau, lateral to the infrapatellar tendon. Area Prepped: Entire knee area, from the mid-thigh to the mid-shin. Prepping solution: DuraPrep (Iodine Povacrylex [0.7% available iodine] and Isopropyl Alcohol, 74% w/w) Safety Precautions: Aspiration looking for blood return was conducted prior to all injections. At no point did we inject any substances, as a needle was being advanced. No attempts were made at seeking any paresthesias. Safe injection practices and needle disposal techniques used. Medications properly checked for expiration dates. SDV (single dose vial) medications used. Description of the Procedure: Protocol guidelines were followed. The patient was placed in position over the fluoroscopy table. The target area was identified and the area prepped in the usual manner. Skin & deeper tissues infiltrated with local anesthetic. Appropriate amount of time allowed to pass for local anesthetics to take effect. The procedure needles were then advanced to the target area. Proper needle placement secured. Negative aspiration confirmed. Solution injected in intermittent fashion, asking for systemic symptoms every 0.5cc of injectate. The needles were then removed and  the area cleansed, making sure to leave some of the prepping solution back to take advantage of its long term bactericidal properties. Vitals:   08/30/19 1130  BP: (!) 160/66  Pulse: 74  Resp: 18  Temp: (!) 97.5 F (36.4 C)  SpO2: 97%  Weight: 270 lb (122.5 kg)  Height: 5\' 8"  (1.727  m)    Start Time: 1151 hrs. End Time: 1153 hrs. Materials:  Needle(s) Type: Regular needle Gauge: 25G Length: 1.5-in Medication(s): Please see orders for medications and dosing details.  Imaging Guidance:          Type of Imaging Technique: None used Indication(s): N/A Exposure Time: No patient exposure Contrast: None used. Fluoroscopic Guidance: N/A Ultrasound Guidance: N/A Interpretation: N/A  Antibiotic Prophylaxis:   Anti-infectives (From admission, onward)   None     Indication(s): None identified  Post-operative Assessment:  Post-procedure Vital Signs:  Pulse/HCG Rate: 74  Temp: (!) 97.5 F (36.4 C) Resp: 18 BP: (!) 160/66 SpO2: 97 %  EBL: None  Complications: No immediate post-treatment complications observed by team, or reported by patient.  Note: The patient tolerated the entire procedure well. A repeat set of vitals were taken after the procedure and the patient was kept under observation following institutional policy, for this type of procedure. Post-procedural neurological assessment was performed, showing return to baseline, prior to discharge. The patient was provided with post-procedure discharge instructions, including a section on how to identify potential problems. Should any problems arise concerning this procedure, the patient was given instructions to immediately contact us, at any time, without hesitation. In any case, we plan to contact the patient by telephone for a follow-up status report regarding this interventional procedure.  Comments:  No additional relevant information.  Plan of Care  Orders:  Orders Placed This Encounter  Procedures  . KNEE  INJECTION    Hyalgan knee injection. Please order Hyalgan.    Standing Status:   Future    Standing Expiration Date:   09/30/2019    Scheduling Instructions:     Procedure: Intra-articular Hyalgan Knee injection    #3         Side:RIGHT     Sedation: None     Timeframe: in 4 weeks    Order Specific Question:   Where will this procedure be performed?    Answer:   [facility]    Medications ordered for procedure: Meds ordered this encounter  Medications  . Sodium Hyaluronate SOSY 2 mL  . lidocaine (PF) (XYLOCAINE) 1 % injection 5 mL   Medications administered: We administered Sodium Hyaluronate and lidocaine (PF).  See the medical record for exact dosing, route, and time of administration.  Follow-up plan:   Return in about 4 weeks (around 09/27/2019) for Procedure R KNEE HYALGAN #3.     Recent Visits Date Type Provider Dept  08/02/19 Procedure visit Gillis Santa, MD [facility]  07/13/19 Office Visit Gillis Santa, MD [facility]  Showing recent visits within past 90 days and meeting all other requirements   Today's Visits Date Type Provider Dept  08/30/19 Procedure visit Gillis Santa, MD [facility]  Showing today's visits and meeting all other requirements   Future Appointments No visits were found meeting these conditions.  Showing future appointments within next 90 days and meeting all other requirements   Disposition: Discharge home  Discharge Date & Time: 08/30/2019;   hrs.   Primary Care Physician: Sharyne Peach, MD Location: [facility] Note by: Gillis Santa, MD Date: 08/30/2019; Time: 12:01 PM  Disclaimer:  Medicine is not an exact science. The only guarantee in medicine is that nothing is guaranteed. It is important to note that the decision to proceed with this intervention was based on the information collected from the patient. The Data and conclusions were drawn from  the patient's  questionnaire, the interview, and the physical examination. Because the information was provided in large part by the patient, it cannot be guaranteed that it has not been purposely or unconsciously manipulated. Every effort has been made to obtain as much relevant data as possible for this evaluation. It is important to note that the conclusions that lead to this procedure are derived in large part from the available data. Always take into account that the treatment will also be dependent on availability of resources and existing treatment guidelines, considered by other Pain Management Practitioners as being common knowledge and practice, at the time of the intervention. For Medico-Legal purposes, it is also important to point out that variation in procedural techniques and pharmacological choices are the acceptable norm. The indications, contraindications, technique, and results of the above procedure should only be interpreted and judged by a Board-Certified Interventional Pain Specialist with extensive familiarity and expertise in the same exact procedure and technique.

## 2019-08-30 NOTE — Therapy (Signed)
[place] MAIN [facility] SERVICES [address] [place], [place], [address] Phone: [phone]   Fax:  [phone]  Physical Therapy Treatment  Patient Details  Name: Rachel Holmes MRN: [id number] Date of Birth: 07/27/1961 Referring Provider (PT): Eulogio Ditch    Encounter Date: 08/30/2019  PT End of Session - 08/30/19 1003    Visit Number  6    Number of Visits  16    Date for PT Re-Evaluation  10/06/19    Authorization Type  6/10 eval 08/11/19    PT Start Time  0934    PT Stop Time  1015    PT Time Calculation (min)  41 min    Equipment Utilized During Treatment  Gait belt;Other (comment)   L prosthesis   Activity Tolerance  Patient tolerated treatment well;Patient limited by fatigue    Behavior During Therapy  [facility] for tasks assessed/performed       Past Medical History:  Diagnosis Date  . Allergic rhinitis   . Allergy   . Anemia   . Anxiety   . Arthritis   . Back pain   . Bradycardia   . Breast mass    Patient can no longer palpate specific masses but showed tech general area of concern  . CHF (congestive heart failure) ([place])   . CKD (chronic kidney disease)    STAGE 3  . Constipation   . Diabetes mellitus without complication ([place])   . Diabetic nephropathy ([place])   . Dyspnea   . GERD (gastroesophageal reflux disease)   . Heart murmur   . HLD (hyperlipidemia)   . Hyperparathyroidism ([place])   . Hypertension   . Joint pain   . Leg edema   . Legally blind in left eye, as defined in [place]   . Lymphedema   . Obesity   . Onychomycosis     Past Surgical History:  Procedure Laterality Date  . ABDOMINAL HYSTERECTOMY    . AMPUTATION Left 05/05/2019   Procedure: AMPUTATION BELOW KNEE;  Surgeon: Katha Cabal, MD;  Location: [facility] ORS;  Service: Vascular;  Laterality: Left;  . BREAST BIOPSY Left 2014   FNA 12:00 position - Negative  . EYE SURGERY Left 2007   removed a lens, no lens implanted  . IR FLUORO GUIDE CV LINE  RIGHT  12/29/2018  . IR REMOVAL TUN CV CATH W/O FL  02/19/2019  . IR US GUIDE VASC ACCESS RIGHT  12/29/2018  . TEE WITHOUT CARDIOVERSION N/A 12/28/2018   Procedure: TRANSESOPHAGEAL ECHOCARDIOGRAM (TEE);  Surgeon: Sueanne Margarita, MD;  Location: [facility] ENDOSCOPY;  Service: Cardiovascular;  Laterality: N/A;  . VAGINAL HYSTERECTOMY     abdominal hyst, not a vaginal hyst    There were no vitals filed for this visit.  Subjective Assessment - 08/30/19 0939    Subjective  Patient reports she will miss later this week and next week due to having cataract surgery. Has been practicing her walking.    Pertinent History  Patient is a pleasant 58 year old female who presents for gait training/ stability for L BKA. Recently obtained her prosthesis last Friday 08/06/19 and has not worn it since the prosthetists office. PMH includes anemia, anxiety, arthritis, back pain, bradycardia, CHF, CKD stage II, DM, diabetic nephropathy, dyspnea, GERD, HLD, hyperparathyroidism, hypotension, legally blind in left eye, lymphedema, obesity, onychomycosis. Left BKA performed 05/05/19, noted in history that patient had a fall after procedure injuring the anterior portion of the suture line. Currently using  a manual chair to get around the home. At this time she can't get into bathroom due to wheelchair, is waiting for a handicap apartment.    Limitations  Lifting;Standing;Walking;House hold activities    How long can you sit comfortably?  n/a    How long can you stand comfortably?  only stood once with prosthetist    How long can you walk comfortably?  not able to walk yet.    Patient Stated Goals  to walk again with prosthesis    Currently in Pain?  Yes    Pain Score  4     Pain Location  Knee    Pain Orientation  Right    Pain Descriptors / Indicators  Aching    Pain Type  Chronic pain    Pain Onset  More than a month ago    Pain Frequency  Constant           vitals: 142/69 pulse 67    ambulate 96 ft with RW and CGA ,  5lb ankle weight on   Sit to stand with yellow dynadisc under RLE from raised matt table 3x   Standing yellow dynadisc under RLE for weight shift to LLE, pertubation's applied, anterior, posterior, medial, lateral ; decreasing assistance with BUE support on RW  2x 30 seconds    Seated  Marching: focus on upright posture 15x each LE, 5lb ankle weights on LE's.  LAQ 5lb ankle weights 10x  Each LE, focus on slow velocity for optimal muscle recruitment Seated IR/ER 5lb ankle wight, ball between knees for body mechanics 15x per leg.   Seated gluteal squeeze 15x 5 second holds Hamstring stretch LLE on PT leg for 60 seconds with overpressure RTB: hip abduction 15x  *Patient educated on need for continued compliance with Hep and seated exercises when off for surgery.     Pt educated throughout session about proper posture and technique with exercises. Improved exercise technique, movement at target joints, use of target muscles after min to mod verbal, visual, tactile cues              PT Education - 08/30/19 1005    Education Details  exercise technique, compliance with HEP when off for surgery    Person(s) Educated  Patient    Methods  Explanation;Demonstration;Tactile cues;Verbal cues    Comprehension  Verbalized understanding;Returned demonstration;Verbal cues required;Tactile cues required       PT Short Term Goals - 08/11/19 1729      PT SHORT TERM GOAL #1   Title  Patient will be independent in home exercise program to improve strength/mobility for better functional independence with ADLs.    Baseline  10/7: HEP given    Time  2    Period  Weeks    Status  New    Target Date  08/25/19        PT Long Term Goals - 08/11/19 1730      PT LONG TERM GOAL #1   Title  Patient will tolerate static standing for > 5 minutes for standing ADL performance with weight acceptance onto prosthetic limb to improve quality of life and mobility.    Baseline  10/7: 1 minute 39 seconds  with heavy UE support on RW    Time  8    Period  Weeks    Status  New    Target Date  10/06/19      PT LONG TERM GOAL #2   Title  Patient will ambulate >  100 feet with RW, mod I with right prosthetic exhibiting reciprocal gait pattern with good  management of prosthetic and good safety awareness for improved home ambulation    Baseline  10/7: unable to ambulate    Time  8    Period  Weeks    Status  New    Target Date  10/06/19      PT LONG TERM GOAL #3   Title  Patient (< 79 years old) will complete five times sit to stand test in < 10 seconds indicating an increased LE strength and improved balance.    Baseline  10/7: 21 seconds with heavy BUE support from raised surface    Time  8    Period  Weeks    Status  New    Target Date  10/06/19      PT LONG TERM GOAL #4   Title  Patient will increase BLE gross strength to 4+/5 as to improve functional strength for independent gait, increased standing tolerance and increased ADL ability.    Baseline  10/7: L 3+/5 R 4-/5    Time  8    Period  Weeks    Status  New    Target Date  10/06/19            Plan - 08/30/19 1004    Clinical Impression Statement  Patient progressing with ambulatory capacity and duration, was slightly limited by ability to weight shift onto LLE this session. Heavy use of UE's on RW with fatigue. Patient will miss next week due to cataract surgery. Patient will benefit from skilled physical therapy for ambulation and transfers with prosthesis, strength and stability, and increase stability with functional mobility for quality of life and decreased falls risk    Personal Factors and Comorbidities  Age;Comorbidity 3+;Education;Finances;Fitness;Past/Current Experience;Social Background;Time since onset of injury/illness/exacerbation;Other    Comorbidities  anemia, anxiety, arthritis, back pain, bradycardia, CHF, CKD stage III, DM, diabetic nephropathy, dyspnea, GERD, heart murmer, HLD, hyperparathyroidism, HTN,  blind in L eye, obesity, oncychomycosis    Examination-Activity Limitations  Bathing;Bed Mobility;Caring for Others;Bend;Carry;Dressing;Continence;Hygiene/Grooming;Stairs;Squat;Reach Overhead;Locomotion Level;Lift;Stand;Toileting;Transfers    Examination-Participation Restrictions  Church;Cleaning;Community Activity;Interpersonal Relationship;Driving;Laundry;Volunteer;Shop;Personal Finances;Meal Prep;Yard Work;Other    Stability/Clinical Decision Making  Evolving/Moderate complexity    Rehab Potential  Fair    PT Frequency  2x / week    PT Duration  8 weeks    PT Treatment/Interventions  ADLs/Self Care Home Management;Aquatic Therapy;Biofeedback;Cryotherapy;Electrical Stimulation;Iontophoresis 4mg /ml Dexamethasone;Moist Heat;Traction;Ultrasound;Therapeutic activities;Functional mobility training;Stair training;Gait training;DME Instruction;Therapeutic exercise;Balance training;Neuromuscular re-education;Patient/family education;Manual techniques;Wheelchair Tax adviser;Compression bandaging;Scar mobilization;Passive range of motion;Dry needling;Energy conservation;Splinting;Taping    PT Next Visit Plan  ambulate with RW    PT Home Exercise Plan  prone 10 minutes a day, wear prosthesis 1 hour a day, side lie posterior kick backs 10x    Consulted and Agree with Plan of Care  Patient       Patient will benefit from skilled therapeutic intervention in order to improve the following deficits and impairments:  Abnormal gait, Cardiopulmonary status limiting activity, Decreased activity tolerance, Decreased balance, Decreased knowledge of precautions, Decreased endurance, Decreased coordination, Decreased knowledge of use of DME, Decreased mobility, Decreased range of motion, Decreased safety awareness, Difficulty walking, Decreased strength, Hypomobility, Impaired flexibility, Impaired perceived functional ability, Impaired sensation, Obesity, Prosthetic Dependency, Postural  dysfunction, Improper body mechanics, Pain  Visit Diagnosis: Other abnormalities of gait and mobility  Unsteadiness on feet  Muscle weakness (generalized)     Problem List Patient Active Problem List   Diagnosis Date  Noted  . Chronic pain syndrome 07/13/2019  . Wound dehiscence 05/29/2019  . Hx of BKA, left ([place]) 05/24/2019  . PVD (peripheral vascular disease) ([place]) 05/13/2019  . Chronic osteomyelitis of ankle and foot, left ([place]) 05/05/2019  . Chronic diastolic CHF (congestive heart failure) ([place]) 04/19/2019  . Leg edema 04/19/2019  . Acute bacterial endocarditis 04/19/2019  . Chest pain of uncertain etiology [id number]  . Ulcer of left ankle ([place]) 04/14/2019  . Ankle deformity, left 01/21/2019  . Diabetic polyneuropathy associated with type 2 diabetes mellitus ([place]) 01/21/2019  . Aortic valve endocarditis   . Bacteremia due to Streptococcus 12/24/2018  . Diabetic ulcer of left ankle associated with diabetes mellitus due to underlying condition, with fat layer exposed ([place]) 11/09/2018  . Malignant hypertension (arteriolar nephrosclerosis), stage 1-4 or unspecified chronic kidney disease 07/15/2018  . Stage 4 chronic kidney disease ([place]) 07/15/2018  . Morbid obesity with BMI of 45.0-49.9, adult ([place]) 07/15/2018  . Excessive daytime sleepiness 07/15/2018  . Other fatigue 03/11/2018  . Essential hypertension 03/11/2018  . Vitamin D deficiency 03/11/2018  . Congestive heart failure ([place]) 03/11/2018  . Primary osteoarthritis of right knee 01/06/2018  . Uncontrolled type 2 diabetes mellitus with polyneuropathy ([place]) 10/03/2017  . Chronic pain of right knee 10/02/2017  . Obesity 10/02/2017  . Charcot's joint of left foot 08/29/2017  . Lymphedema 09/06/2016  . Hyponatremia with extracellular fluid depletion 02/02/2016  . Nausea with vomiting, unspecified 02/02/2016  . Closed nondisplaced fracture of right patella 08/23/2015  . Acute cystitis without hematuria 08/14/2014  .  Colitis 08/14/2014  . HTN (hypertension), malignant 08/14/2014  . Acute renal failure superimposed on stage 3 chronic kidney disease ([place]) 06/26/2014  . Hyperparathyroidism, unspecified ([place]) 02/24/2014  . Allergic rhinitis 02/22/2014  . Onychomycosis 02/22/2014  . Anxiety 11/05/2013  . Bradycardia 05/14/2013  . Gastro-esophageal reflux disease without esophagitis 04/05/2013  . Anemia in other chronic diseases classified elsewhere 01/27/2013   Janna Arch, PT, DPT   08/30/2019, 10:20 AM  [place] MAIN [facility] SERVICES [address] [place], [place], [address] Phone: [phone]   Fax:  [phone]  Name: Rachel Holmes MRN: [id number] Date of Birth: 1961/01/04

## 2019-08-30 NOTE — Progress Notes (Signed)
Safety precautions to be maintained throughout the outpatient stay will include: orient to surroundings, keep bed in low position, maintain call bell within reach at all times, provide assistance with transfer out of bed and ambulation.  

## 2019-08-31 ENCOUNTER — Telehealth: Payer: Self-pay | Admitting: *Deleted

## 2019-08-31 NOTE — Telephone Encounter (Signed)
No problems post procedure. 

## 2019-09-01 ENCOUNTER — Ambulatory Visit: Payer: Medicare HMO

## 2019-09-02 DIAGNOSIS — H269 Unspecified cataract: Secondary | ICD-10-CM | POA: Insufficient documentation

## 2019-09-06 ENCOUNTER — Ambulatory Visit: Payer: Medicare HMO

## 2019-09-08 ENCOUNTER — Ambulatory Visit: Payer: Medicare HMO | Attending: Nurse Practitioner

## 2019-09-08 ENCOUNTER — Other Ambulatory Visit: Payer: Self-pay

## 2019-09-08 ENCOUNTER — Ambulatory Visit: Payer: Medicare HMO

## 2019-09-08 DIAGNOSIS — R2689 Other abnormalities of gait and mobility: Secondary | ICD-10-CM | POA: Diagnosis not present

## 2019-09-08 DIAGNOSIS — M6281 Muscle weakness (generalized): Secondary | ICD-10-CM

## 2019-09-08 DIAGNOSIS — R2681 Unsteadiness on feet: Secondary | ICD-10-CM | POA: Diagnosis present

## 2019-09-08 NOTE — Therapy (Signed)
[place] MAIN [facility] SERVICES [address] [place], [place], [address] Phone: [phone]   Fax:  [phone]  Physical Therapy Treatment  Patient Details  Name: Rachel Holmes MRN: [id number] Date of Birth: Feb 05, 1961 Referring Provider (PT): Eulogio Ditch    Encounter Date: 09/08/2019  PT End of Session - 09/08/19 0921    Visit Number  7    Number of Visits  16    Date for PT Re-Evaluation  10/06/19    Authorization Type  7/10 eval 08/11/19    PT Start Time  0846    PT Stop Time  0929    PT Time Calculation (min)  43 min    Equipment Utilized During Treatment  Gait belt;Other (comment)   L prosthesis   Activity Tolerance  Patient tolerated treatment well;Patient limited by fatigue    Behavior During Therapy  [facility] for tasks assessed/performed       Past Medical History:  Diagnosis Date  . Allergic rhinitis   . Allergy   . Anemia   . Anxiety   . Arthritis   . Back pain   . Bradycardia   . Breast mass    Patient can no longer palpate specific masses but showed tech general area of concern  . CHF (congestive heart failure) ([place])   . CKD (chronic kidney disease)    STAGE 3  . Constipation   . Diabetes mellitus without complication ([place])   . Diabetic nephropathy ([place])   . Dyspnea   . GERD (gastroesophageal reflux disease)   . Heart murmur   . HLD (hyperlipidemia)   . Hyperparathyroidism ([place])   . Hypertension   . Joint pain   . Leg edema   . Legally blind in left eye, as defined in [place]   . Lymphedema   . Obesity   . Onychomycosis     Past Surgical History:  Procedure Laterality Date  . ABDOMINAL HYSTERECTOMY    . AMPUTATION Left 05/05/2019   Procedure: AMPUTATION BELOW KNEE;  Surgeon: Katha Cabal, MD;  Location: [facility] ORS;  Service: Vascular;  Laterality: Left;  . BREAST BIOPSY Left 2014   FNA 12:00 position - Negative  . EYE SURGERY Left 2007   removed a lens, no lens implanted  . IR FLUORO GUIDE CV LINE  RIGHT  12/29/2018  . IR REMOVAL TUN CV CATH W/O FL  02/19/2019  . IR US GUIDE VASC ACCESS RIGHT  12/29/2018  . TEE WITHOUT CARDIOVERSION N/A 12/28/2018   Procedure: TRANSESOPHAGEAL ECHOCARDIOGRAM (TEE);  Surgeon: Sueanne Margarita, MD;  Location: [facility]OS[facility];  Service: Cardiovascular;  Laterality: N/A;  . VAGINAL HYSTERECTOMY     abdominal hyst, not a vaginal hyst    There were no vitals filed for this visit.  Subjective Assessment - 09/08/19 0915    Subjective  Patient surgery rescheduled due to unstable blood pressure. Patient had a change in blood pressure medication and now has had less headaches now.    Pertinent History  Patient is a pleasant 58 year old female who presents for gait training/ stability for L BKA. Recently obtained her prosthesis last Friday 08/06/19 and has not worn it since the prosthetists office. PMH includes anemia, anxiety, arthritis, back pain, bradycardia, CHF, CKD stage II, DM, diabetic nephropathy, dyspnea, GERD, HLD, hyperparathyroidism, hypotension, legally blind in left eye, lymphedema, obesity, onychomycosis. Left BKA performed 05/05/19, noted in history that patient had a fall after procedure injuring the anterior portion of the suture line.  Currently using a manual chair to get around the home. At this time she can't get into bathroom due to wheelchair, is waiting for a handicap apartment.    Limitations  Lifting;Standing;Walking;House hold activities    How long can you sit comfortably?  n/a    How long can you stand comfortably?  only stood once with prosthetist    How long can you walk comfortably?  not able to walk yet.    Patient Stated Goals  to walk again with prosthesis    Currently in Pain?  Yes    Pain Score  3     Pain Location  Knee    Pain Orientation  Right    Pain Descriptors / Indicators  Aching;Discomfort    Pain Type  Chronic pain    Pain Onset  More than a month ago    Pain Frequency  Constant        Patient surgery rescheduled due to  unstable blood pressure. Patient had a change in blood pressure medication and now has had less headaches now.     vitals: 156/ 78 pulse 75   Standing in // bars - ambulate in // bars 2x length , 5lb ankle weight on RLE for pain reduction   -weight shift left and right with decreased UE support 60 seconds   Sit to stand with yellow dynadisc under RLE from raised matt table 3x    -speed ladder: one foot in each square . BUE support, cueing for knee flexion and hip flexion for LLE/prosthesis for clearance to redeuce scuffing of prosthetic limb.  2x length of // bars, 5 sets  Standing yellow dynadisc under RLE for weight shift to LLE, no UE support : occasional UE support taps for stability 2x 45 seconds   -backwards ambulation 2x length of // bars with CGA, cueing for hand placement for reduction of trunk flexion   -6" step toe taps BUE support 6x each LE, cueing for hip/knee flexion for clearance    Seated  Marching: focus on upright posture 15x each LE, 5lb ankle weights on LE's.  LAQ 5lb ankle weights 10x  Each LE, focus on slow velocity for optimal muscle recruitment   Seated gluteal squeeze 15x 5 second holds Hamstring stretch BLE on PT leg for 60 seconds with overpressure    *Patient educated on need for continued compliance with Hep and seated exercises when off for surgery.     Pt educated throughout session about proper posture and technique with exercises. Improved exercise technique, movement at target joints, use of target muscles after min to mod verbal, visual, tactile cues                   PT Education - 09/08/19 0916    Education Details  exercise technique, prosthetic fit with sock fit,    Person(s) Educated  Patient    Methods  Explanation;Demonstration;Tactile cues;Verbal cues    Comprehension  Verbalized understanding;Returned demonstration;Verbal cues required;Tactile cues required       PT Short Term Goals - 08/11/19 1729      PT SHORT  TERM GOAL #1   Title  Patient will be independent in home exercise program to improve strength/mobility for better functional independence with ADLs.    Baseline  10/7: HEP given    Time  2    Period  Weeks    Status  New    Target Date  08/25/19        PT Long  Term Goals - 08/11/19 1730      PT LONG TERM GOAL #1   Title  Patient will tolerate static standing for > 5 minutes for standing ADL performance with weight acceptance onto prosthetic limb to improve quality of life and mobility.    Baseline  10/7: 1 minute 39 seconds with heavy UE support on RW    Time  8    Period  Weeks    Status  New    Target Date  10/06/19      PT LONG TERM GOAL #2   Title  Patient will ambulate >100 feet with RW, mod I with right prosthetic exhibiting reciprocal gait pattern with good  management of prosthetic and good safety awareness for improved home ambulation    Baseline  10/7: unable to ambulate    Time  8    Period  Weeks    Status  New    Target Date  10/06/19      PT LONG TERM GOAL #3   Title  Patient (< 73 years old) will complete five times sit to stand test in < 10 seconds indicating an increased LE strength and improved balance.    Baseline  10/7: 21 seconds with heavy BUE support from raised surface    Time  8    Period  Weeks    Status  New    Target Date  10/06/19      PT LONG TERM GOAL #4   Title  Patient will increase BLE gross strength to 4+/5 as to improve functional strength for independent gait, increased standing tolerance and increased ADL ability.    Baseline  10/7: L 3+/5 R 4-/5    Time  8    Period  Weeks    Status  New    Target Date  10/06/19            Plan - 09/08/19 [id number]    Clinical Impression Statement  Patient educated on sock fit/wear for proper prosthetic fit for mobility. Patient is progressing with gait mechanics however is limited in her stability on prosthetic limb without UE support at this time. Use of 5lb weight on RLE reduces knee pain  allowing patient to perform interventions. Patient will benefit from skilled physical therapy for ambulation and transfers with prosthesis, strength and stability, and increase stability with functional mobility for quality of life and decreased falls risk    Personal Factors and Comorbidities  Age;Comorbidity 3+;Education;Finances;Fitness;Past/Current Experience;Social Background;Time since onset of injury/illness/exacerbation;Other    Comorbidities  anemia, anxiety, arthritis, back pain, bradycardia, CHF, CKD stage III, DM, diabetic nephropathy, dyspnea, GERD, heart murmer, HLD, hyperparathyroidism, HTN, blind in L eye, obesity, oncychomycosis    Examination-Activity Limitations  Bathing;Bed Mobility;Caring for Others;Bend;Carry;Dressing;Continence;Hygiene/Grooming;Stairs;Squat;Reach Overhead;Locomotion Level;Lift;Stand;Toileting;Transfers    Examination-Participation Restrictions  Church;Cleaning;Community Activity;Interpersonal Relationship;Driving;Laundry;Volunteer;Shop;Personal Finances;Meal Prep;Yard Work;Other    Stability/Clinical Decision Making  Evolving/Moderate complexity    Rehab Potential  Fair    PT Frequency  2x / week    PT Duration  8 weeks    PT Treatment/Interventions  ADLs/Self Care Home Management;Aquatic Therapy;Biofeedback;Cryotherapy;Electrical Stimulation;Iontophoresis 4mg /ml Dexamethasone;Moist Heat;Traction;Ultrasound;Therapeutic activities;Functional mobility training;Stair training;Gait training;DME Instruction;Therapeutic exercise;Balance training;Neuromuscular re-education;Patient/family education;Manual techniques;Wheelchair Tax adviser;Compression bandaging;Scar mobilization;Passive range of motion;Dry needling;Energy conservation;Splinting;Taping    PT Next Visit Plan  ambulate with RW    PT Home Exercise Plan  prone 10 minutes a day, wear prosthesis 1 hour a day, side lie posterior kick backs 10x    Consulted and Agree with Plan of Care  Patient       Patient will benefit from skilled therapeutic intervention in order to improve the following deficits and impairments:  Abnormal gait, Cardiopulmonary status limiting activity, Decreased activity tolerance, Decreased balance, Decreased knowledge of precautions, Decreased endurance, Decreased coordination, Decreased knowledge of use of DME, Decreased mobility, Decreased range of motion, Decreased safety awareness, Difficulty walking, Decreased strength, Hypomobility, Impaired flexibility, Impaired perceived functional ability, Impaired sensation, Obesity, Prosthetic Dependency, Postural dysfunction, Improper body mechanics, Pain  Visit Diagnosis: Other abnormalities of gait and mobility  Unsteadiness on feet  Muscle weakness (generalized)     Problem List Patient Active Problem List   Diagnosis Date Noted  . Chronic pain syndrome 07/13/2019  . Wound dehiscence 05/29/2019  . Hx of BKA, left ([place]) 05/24/2019  . PVD (peripheral vascular disease) ([place]) 05/13/2019  . Chronic osteomyelitis of ankle and foot, left ([place]) 05/05/2019  . Chronic diastolic CHF (congestive heart failure) ([place]) 04/19/2019  . Leg edema 04/19/2019  . Acute bacterial endocarditis 04/19/2019  . Chest pain of uncertain etiology [id number]  . Ulcer of left ankle ([place]) 04/14/2019  . Ankle deformity, left 01/21/2019  . Diabetic polyneuropathy associated with type 2 diabetes mellitus ([place]) 01/21/2019  . Aortic valve endocarditis   . Bacteremia due to Streptococcus 12/24/2018  . Diabetic ulcer of left ankle associated with diabetes mellitus due to underlying condition, with fat layer exposed ([place]) 11/09/2018  . Malignant hypertension (arteriolar nephrosclerosis), stage 1-4 or unspecified chronic kidney disease 07/15/2018  . Stage 4 chronic kidney disease ([place]) 07/15/2018  . Morbid obesity with BMI of 45.0-49.9, adult ([place]) 07/15/2018  . Excessive daytime sleepiness 07/15/2018  . Other fatigue 03/11/2018   . Essential hypertension 03/11/2018  . Vitamin D deficiency 03/11/2018  . Congestive heart failure ([place]) 03/11/2018  . Primary osteoarthritis of right knee 01/06/2018  . Uncontrolled type 2 diabetes mellitus with polyneuropathy ([place]) 10/03/2017  . Chronic pain of right knee 10/02/2017  . Obesity 10/02/2017  . Charcot's joint of left foot 08/29/2017  . Lymphedema 09/06/2016  . Hyponatremia with extracellular fluid depletion 02/02/2016  . Nausea with vomiting, unspecified 02/02/2016  . Closed nondisplaced fracture of right patella 08/23/2015  . Acute cystitis without hematuria 08/14/2014  . Colitis 08/14/2014  . HTN (hypertension), malignant 08/14/2014  . Acute renal failure superimposed on stage 3 chronic kidney disease ([place]) 06/26/2014  . Hyperparathyroidism, unspecified ([place]) 02/24/2014  . Allergic rhinitis 02/22/2014  . Onychomycosis 02/22/2014  . Anxiety 11/05/2013  . Bradycardia 05/14/2013  . Gastro-esophageal reflux disease without esophagitis 04/05/2013  . Anemia in other chronic diseases classified elsewhere 01/27/2013   Janna Arch, PT, DPT   09/08/2019, 9:41 AM  [place] MAIN [facility] SERVICES [address] [place], [place], [address] Phone: [phone]   Fax:  [phone]  Name: Rachel Holmes MRN: [id number] Date of Birth: 02-12-1961

## 2019-09-10 ENCOUNTER — Telehealth (INDEPENDENT_AMBULATORY_CARE_PROVIDER_SITE_OTHER): Payer: Self-pay

## 2019-09-10 ENCOUNTER — Other Ambulatory Visit (INDEPENDENT_AMBULATORY_CARE_PROVIDER_SITE_OTHER): Payer: Self-pay | Admitting: Nurse Practitioner

## 2019-09-10 MED ORDER — TRAMADOL HCL 50 MG PO TABS: ORAL_TABLET | ORAL | 0 refills | Status: DC

## 2019-09-10 NOTE — Telephone Encounter (Signed)
Patient has been made aware medication was sent to pharmacy

## 2019-09-10 NOTE — Telephone Encounter (Signed)
Sent in

## 2019-09-13 ENCOUNTER — Other Ambulatory Visit: Payer: Self-pay

## 2019-09-13 ENCOUNTER — Ambulatory Visit: Payer: Medicare HMO

## 2019-09-13 ENCOUNTER — Telehealth: Payer: Self-pay | Admitting: Student in an Organized Health Care Education/Training Program

## 2019-09-13 DIAGNOSIS — M6281 Muscle weakness (generalized): Secondary | ICD-10-CM

## 2019-09-13 DIAGNOSIS — R2689 Other abnormalities of gait and mobility: Secondary | ICD-10-CM | POA: Diagnosis not present

## 2019-09-13 DIAGNOSIS — M1711 Unilateral primary osteoarthritis, right knee: Secondary | ICD-10-CM

## 2019-09-13 DIAGNOSIS — R2681 Unsteadiness on feet: Secondary | ICD-10-CM

## 2019-09-13 NOTE — Telephone Encounter (Signed)
Order placed for RIGHT genicular NB, cancel upcoming IA hyalgan and replace with R genicular NB. Up to patient if she wants sedation.  Orders Placed This Encounter  Procedures  . Genicular NB (Schedule)    Indication(s):  Sub-acute knee pain    Standing Status:   Future    Standing Expiration Date:   10/13/2019    Scheduling Instructions:     Side: RIGHT     Sedation: up to patient     Timeframe: As soon as schedule allows    Order Specific Question:   Where will this procedure be performed?    Answer:   [facility]

## 2019-09-13 NOTE — Telephone Encounter (Signed)
Patient called for pre procedure instructions.  Patient states that she does not want sedation.  Pre procedure instructions given.

## 2019-09-13 NOTE — Therapy (Signed)
[place] MAIN [facility] SERVICES [address] [place], [place], [address] Phone: [phone]   Fax:  [phone]  Physical Therapy Treatment  Patient Details  Name: Rachel Holmes MRN: [id number] Date of Birth: [birth year] Referring Provider (PT): Eulogio Ditch    Encounter Date: 09/13/2019  PT End of Session - 09/13/19 0938    Visit Number  8    Number of Visits  16    Date for PT Re-Evaluation  10/06/19    Authorization Type  8/10 eval 08/11/19    PT Start Time  0930    PT Stop Time  [id number]    PT Time Calculation (min)  44 min    Equipment Utilized During Treatment  Gait belt;Other (comment)   L prosthesis   Activity Tolerance  Patient tolerated treatment well;Patient limited by fatigue    Behavior During Therapy  [facility] for tasks assessed/performed       Past Medical History:  Diagnosis Date  . Allergic rhinitis   . Allergy   . Anemia   . Anxiety   . Arthritis   . Back pain   . Bradycardia   . Breast mass    Patient can no longer palpate specific masses but showed tech general area of concern  . CHF (congestive heart failure) ([place])   . CKD (chronic kidney disease)    STAGE 3  . Constipation   . Diabetes mellitus without complication ([place])   . Diabetic nephropathy ([place])   . Dyspnea   . GERD (gastroesophageal reflux disease)   . Heart murmur   . HLD (hyperlipidemia)   . Hyperparathyroidism ([place])   . Hypertension   . Joint pain   . Leg edema   . Legally blind in left eye, as defined in [place]   . Lymphedema   . Obesity   . Onychomycosis     Past Surgical History:  Procedure Laterality Date  . ABDOMINAL HYSTERECTOMY    . AMPUTATION Left 05/05/2019   Procedure: AMPUTATION BELOW KNEE;  Surgeon: Katha Cabal, MD;  Location: [facility] ORS;  Service: Vascular;  Laterality: Left;  . BREAST BIOPSY Left 2014   FNA 12:00 position - Negative  . EYE SURGERY Left 2007   removed a lens, no lens implanted  . IR FLUORO GUIDE CV LINE  RIGHT  12/29/2018  . IR REMOVAL TUN CV CATH W/O FL  02/19/2019  . IR US GUIDE VASC ACCESS RIGHT  12/29/2018  . TEE WITHOUT CARDIOVERSION N/A 12/28/2018   Procedure: TRANSESOPHAGEAL ECHOCARDIOGRAM (TEE);  Surgeon: Sueanne Margarita, MD;  Location: [facility];  Service: Cardiovascular;  Laterality: N/A;  . VAGINAL HYSTERECTOMY     abdominal hyst, not a vaginal hyst    There were no vitals filed for this visit.  Subjective Assessment - 09/13/19 0932    Subjective  Patient reports yesterday she had a bad case of vertigo and could barely move. Reports its better today but still is a little light headed.    Pertinent History  Patient is a pleasant 58 year old female who presents for gait training/ stability for L BKA. Recently obtained her prosthesis last Friday 08/06/19 and has not worn it since the prosthetists office. PMH includes anemia, anxiety, arthritis, back pain, bradycardia, CHF, CKD stage II, DM, diabetic nephropathy, dyspnea, GERD, HLD, hyperparathyroidism, hypotension, legally blind in left eye, lymphedema, obesity, onychomycosis. Left BKA performed 05/05/19, noted in history that patient had a fall after procedure injuring the anterior portion of the  suture line. Currently using a manual chair to get around the home. At this time she can't get into bathroom due to wheelchair, is waiting for a handicap apartment.    Limitations  Lifting;Standing;Walking;House hold activities    How long can you sit comfortably?  n/a    How long can you stand comfortably?  only stood once with prosthetist    How long can you walk comfortably?  not able to walk yet.    Patient Stated Goals  to walk again with prosthesis    Currently in Pain?  Yes    Pain Score  4     Pain Location  Knee    Pain Orientation  Right    Pain Descriptors / Indicators  Aching    Pain Type  Chronic pain    Pain Onset  More than a month ago    Pain Frequency  Constant       Vitals: 158/59 pulse 64  Socks: green and yellow    Standing in // bars - ambulate in // bars 2x length , 5lb ankle weight on RLE for pain reduction   -weight shift left and right with decreased UE support 60 seconds  -Side step in // bars 2x length of // bars, cueing for lifting LLE due to pain in single leg stance in R knee.  -backwards ambulation 2x length of // bars with CGA, cueing for hand placement for reduction of trunk flexion  -high knee marches 4x length of // bars BUE support, good weight shift -single UE support step with RUE stepping for weight shift onto LLE. 10x -stepping with SUE support 1x length of // bars ; very challenging to patient.    Sit to stand with yellow dynadisc under RLE from raised matt table 3x    Ambulate 30 ft with RW and CGA, cueing for increasing hip flexion/foot clearance bilaterally as well as keeping walker on ground.   Standing yellow dynadisc under RLE for weight shift to LLE, no UE support : occasional UE support taps for stability 2x 60 seconds       Seated : ice pack on R knee for pain reduction  Marching: focus on upright posture 15x each LE, 5lb ankle weights on LE's.  LAQ 5lb ankle weights 10x 3 second holds Each LE, focus on slow velocity for optimal muscle recruitment   Seated gluteal squeeze 15x 5 second holds  straight leg abduction/adduction; challenging to patient 10x each LE RTB hamstring curls 10x LLE   GTB row for upright posture 15x TrA contraction seated 3 second holds 10x    Pt educated throughout session about proper posture and technique with exercises. Improved exercise technique, movement at target joints, use of target muscles after min to mod verbal, visual, tactile cues                        PT Education - 09/13/19 0933    Education Details  exercise technique, prosthetic fit with socks    Person(s) Educated  Patient    Methods  Explanation;Demonstration;Tactile cues;Verbal cues    Comprehension  Verbalized understanding;Returned  demonstration;Verbal cues required;Tactile cues required       PT Short Term Goals - 08/11/19 1729      PT SHORT TERM GOAL #1   Title  Patient will be independent in home exercise program to improve strength/mobility for better functional independence with ADLs.    Baseline  10/7: HEP given  Time  2    Period  Weeks    Status  New    Target Date  08/25/19        PT Long Term Goals - 08/11/19 1730      PT LONG TERM GOAL #1   Title  Patient will tolerate static standing for > 5 minutes for standing ADL performance with weight acceptance onto prosthetic limb to improve quality of life and mobility.    Baseline  10/7: 1 minute 39 seconds with heavy UE support on RW    Time  8    Period  Weeks    Status  New    Target Date  10/06/19      PT LONG TERM GOAL #2   Title  Patient will ambulate >100 feet with RW, mod I with right prosthetic exhibiting reciprocal gait pattern with good  management of prosthetic and good safety awareness for improved home ambulation    Baseline  10/7: unable to ambulate    Time  8    Period  Weeks    Status  New    Target Date  10/06/19      PT LONG TERM GOAL #3   Title  Patient (< 47 years old) will complete five times sit to stand test in < 10 seconds indicating an increased LE strength and improved balance.    Baseline  10/7: 21 seconds with heavy BUE support from raised surface    Time  8    Period  Weeks    Status  New    Target Date  10/06/19      PT LONG TERM GOAL #4   Title  Patient will increase BLE gross strength to 4+/5 as to improve functional strength for independent gait, increased standing tolerance and increased ADL ability.    Baseline  10/7: L 3+/5 R 4-/5    Time  8    Period  Weeks    Status  New    Target Date  10/06/19            Plan - 09/13/19 1002    Clinical Impression Statement  Patient is improving with ability to weightbear and stabilize self with LLE however is limited with further progression of mobility  due to pain in R knee.  Use of 5lb ankle weight on R knee reduces pain however still is main complaint of patient reducing number of sets/reps of standing interventions. Patient will benefit from skilled physical therapy for ambulation and transfers with prosthesis, strength and stability, and increase stability with functional mobility for quality of life and decreased falls risk    Personal Factors and Comorbidities  Age;Comorbidity 3+;Education;Finances;Fitness;Past/Current Experience;Social Background;Time since onset of injury/illness/exacerbation;Other    Comorbidities  anemia, anxiety, arthritis, back pain, bradycardia, CHF, CKD stage III, DM, diabetic nephropathy, dyspnea, GERD, heart murmer, HLD, hyperparathyroidism, HTN, blind in L eye, obesity, oncychomycosis    Examination-Activity Limitations  Bathing;Bed Mobility;Caring for Others;Bend;Carry;Dressing;Continence;Hygiene/Grooming;Stairs;Squat;Reach Overhead;Locomotion Level;Lift;Stand;Toileting;Transfers    Examination-Participation Restrictions  Church;Cleaning;Community Activity;Interpersonal Relationship;Driving;Laundry;Volunteer;Shop;Personal Finances;Meal Prep;Yard Work;Other    Stability/Clinical Decision Making  Evolving/Moderate complexity    Rehab Potential  Fair    PT Frequency  2x / week    PT Duration  8 weeks    PT Treatment/Interventions  ADLs/Self Care Home Management;Aquatic Therapy;Biofeedback;Cryotherapy;Electrical Stimulation;Iontophoresis 4mg /ml Dexamethasone;Moist Heat;Traction;Ultrasound;Therapeutic activities;Functional mobility training;Stair training;Gait training;DME Instruction;Therapeutic exercise;Balance training;Neuromuscular re-education;Patient/family education;Manual techniques;Wheelchair Tax adviser;Compression bandaging;Scar mobilization;Passive range of motion;Dry needling;Energy conservation;Splinting;Taping    PT Next Visit Plan  ambulate with  RW    PT Home Exercise Plan  prone 10  minutes a day, wear prosthesis 1 hour a day, side lie posterior kick backs 10x    Consulted and Agree with Plan of Care  Patient       Patient will benefit from skilled therapeutic intervention in order to improve the following deficits and impairments:  Abnormal gait, Cardiopulmonary status limiting activity, Decreased activity tolerance, Decreased balance, Decreased knowledge of precautions, Decreased endurance, Decreased coordination, Decreased knowledge of use of DME, Decreased mobility, Decreased range of motion, Decreased safety awareness, Difficulty walking, Decreased strength, Hypomobility, Impaired flexibility, Impaired perceived functional ability, Impaired sensation, Obesity, Prosthetic Dependency, Postural dysfunction, Improper body mechanics, Pain  Visit Diagnosis: Other abnormalities of gait and mobility  Unsteadiness on feet  Muscle weakness (generalized)     Problem List Patient Active Problem List   Diagnosis Date Noted  . Chronic pain syndrome 07/13/2019  . Wound dehiscence 05/29/2019  . Hx of BKA, left ([place]) 05/24/2019  . PVD (peripheral vascular disease) ([place]) 05/13/2019  . Chronic osteomyelitis of ankle and foot, left ([place]) 05/05/2019  . Chronic diastolic CHF (congestive heart failure) ([place]) 04/19/2019  . Leg edema 04/19/2019  . Acute bacterial endocarditis 04/19/2019  . Chest pain of uncertain etiology [id number]  . Ulcer of left ankle ([place]) 04/14/2019  . Ankle deformity, left 01/21/2019  . Diabetic polyneuropathy associated with type 2 diabetes mellitus ([place]) 01/21/2019  . Aortic valve endocarditis   . Bacteremia due to Streptococcus 12/24/2018  . Diabetic ulcer of left ankle associated with diabetes mellitus due to underlying condition, with fat layer exposed ([place]) 11/09/2018  . Malignant hypertension (arteriolar nephrosclerosis), stage 1-4 or unspecified chronic kidney disease 07/15/2018  . Stage 4 chronic kidney disease ([place]) 07/15/2018  . Morbid obesity  with BMI of 45.0-49.9, adult ([place]) 07/15/2018  . Excessive daytime sleepiness 07/15/2018  . Other fatigue 03/11/2018  . Essential hypertension 03/11/2018  . Vitamin D deficiency 03/11/2018  . Congestive heart failure ([place]) 03/11/2018  . Primary osteoarthritis of right knee 01/06/2018  . Uncontrolled type 2 diabetes mellitus with polyneuropathy ([place]) 10/03/2017  . Chronic pain of right knee 10/02/2017  . Obesity 10/02/2017  . Charcot's joint of left foot 08/29/2017  . Lymphedema 09/06/2016  . Hyponatremia with extracellular fluid depletion 02/02/2016  . Nausea with vomiting, unspecified 02/02/2016  . Closed nondisplaced fracture of right patella 08/23/2015  . Acute cystitis without hematuria 08/14/2014  . Colitis 08/14/2014  . HTN (hypertension), malignant 08/14/2014  . Acute renal failure superimposed on stage 3 chronic kidney disease ([place]) 06/26/2014  . Hyperparathyroidism, unspecified ([place]) 02/24/2014  . Allergic rhinitis 02/22/2014  . Onychomycosis 02/22/2014  . Anxiety 11/05/2013  . Bradycardia 05/14/2013  . Gastro-esophageal reflux disease without esophagitis 04/05/2013  . Anemia in other chronic diseases classified elsewhere 01/27/2013   Janna Arch, PT, DPT   09/13/2019, 10:15 AM  [place] [facility] MAIN [facility] SERVICES [address] [place], [place], [address] Phone: [phone]   Fax:  [phone]  Name: Rachel Holmes MRN: [id number] Date of Birth: [birth year]

## 2019-09-13 NOTE — Telephone Encounter (Signed)
Patient called asking if she can go ahead and get scheduled for a nerve block. The injections she has had so far are not helping and are causing problems with her physical therapy.

## 2019-09-13 NOTE — Telephone Encounter (Signed)
Blanch Media please check and see if this needs PA. She has Airline pilot

## 2019-09-15 ENCOUNTER — Ambulatory Visit: Payer: Medicare HMO

## 2019-09-20 ENCOUNTER — Other Ambulatory Visit: Payer: Self-pay

## 2019-09-20 ENCOUNTER — Ambulatory Visit: Payer: Medicare HMO

## 2019-09-20 DIAGNOSIS — E872 Acidosis, unspecified: Secondary | ICD-10-CM | POA: Insufficient documentation

## 2019-09-20 DIAGNOSIS — M6281 Muscle weakness (generalized): Secondary | ICD-10-CM

## 2019-09-20 DIAGNOSIS — R809 Proteinuria, unspecified: Secondary | ICD-10-CM | POA: Insufficient documentation

## 2019-09-20 DIAGNOSIS — R2689 Other abnormalities of gait and mobility: Secondary | ICD-10-CM | POA: Diagnosis not present

## 2019-09-20 DIAGNOSIS — I129 Hypertensive chronic kidney disease with stage 1 through stage 4 chronic kidney disease, or unspecified chronic kidney disease: Secondary | ICD-10-CM | POA: Insufficient documentation

## 2019-09-20 DIAGNOSIS — N2581 Secondary hyperparathyroidism of renal origin: Secondary | ICD-10-CM | POA: Insufficient documentation

## 2019-09-20 DIAGNOSIS — E8722 Chronic metabolic acidosis: Secondary | ICD-10-CM | POA: Insufficient documentation

## 2019-09-20 NOTE — Therapy (Signed)
[place] MAIN [facility] SERVICES [address] [place], [place], [address] Phone: [phone]   Fax:  [phone]  Physical Therapy Wheelchair Evaluation  Patient Details  Name: Rachel Holmes MRN: [id number] Date of Birth: December 16, 1960 Referring Provider (PT): Eulogio Ditch    Encounter Date: 09/20/2019  PT End of Session - 09/20/19 1051    Visit Number  1    Number of Visits  1    Date for PT Re-Evaluation  09/20/19    PT Start Time  0930    PT Stop Time  1032    PT Time Calculation (min)  62 min    Equipment Utilized During Treatment  Gait belt    Activity Tolerance  Patient tolerated treatment well    Behavior During Therapy  [facility] for tasks assessed/performed       Past Medical History:  Diagnosis Date  . Allergic rhinitis   . Allergy   . Anemia   . Anxiety   . Arthritis   . Back pain   . Bradycardia   . Breast mass    Patient can no longer palpate specific masses but showed tech general area of concern  . CHF (congestive heart failure) ([place])   . CKD (chronic kidney disease)    STAGE 3  . Constipation   . Diabetes mellitus without complication ([place])   . Diabetic nephropathy ([place])   . Dyspnea   . GERD (gastroesophageal reflux disease)   . Heart murmur   . HLD (hyperlipidemia)   . Hyperparathyroidism ([place])   . Hypertension   . Joint pain   . Leg edema   . Legally blind in left eye, as defined in [place]   . Lymphedema   . Obesity   . Onychomycosis     Past Surgical History:  Procedure Laterality Date  . ABDOMINAL HYSTERECTOMY    . AMPUTATION Left 05/05/2019   Procedure: AMPUTATION BELOW KNEE;  Surgeon: Katha Cabal, MD;  Location: [facility] ORS;  Service: Vascular;  Laterality: Left;  . BREAST BIOPSY Left 2014   FNA 12:00 position - Negative  . EYE SURGERY Left 2007   removed a lens, no lens implanted  . IR FLUORO GUIDE CV LINE RIGHT  12/29/2018  . IR REMOVAL TUN CV CATH W/O FL  02/19/2019  . IR US GUIDE VASC ACCESS  RIGHT  12/29/2018  . TEE WITHOUT CARDIOVERSION N/A 12/28/2018   Procedure: TRANSESOPHAGEAL ECHOCARDIOGRAM (TEE);  Surgeon: Sueanne Margarita, MD;  Location: [facility] ENDOSCOPY;  Service: Cardiovascular;  Laterality: N/A;  . VAGINAL HYSTERECTOMY     abdominal hyst, not a vaginal hyst    There were no vitals filed for this visit.   Subjective Assessment - 09/20/19 1030    Subjective  Patient presents for wheelchair evaluation.    Patient is accompained by:  Family member   ex husband   Pertinent History  PMH includes anemia, anxiety, arthritis, back pain, bradycardia, CHF, CKD stage II, DM, diabetic nephropathy, dyspnea, GERD, HLD, hyperparathyroidism, hypotension, legally blind in left eye, lymphedema, obesity, onychomycosis. Left BKA performed 05/05/19, noted in history that patient had a fall after procedure injuring the anterior portion of the suture line. Currently using a manual chair to get around the home. At this time she can't get into bathroom due to wheelchair, is waiting for a handicap apartment.    Limitations  Lifting;Standing;Walking;House hold activities    How long can you stand comfortably?  stand with prosthesis on LLE, painful in  R knee    How long can you walk comfortably?  short distances with min A and CGA with prosthesis and high pain.    Patient Stated Goals  to get a power chair    Currently in Pain?  Yes    Pain Score  6     Pain Location  Knee    Pain Orientation  Right    Pain Descriptors / Indicators  Aching    Pain Type  Chronic pain    Pain Onset  More than a month ago    Pain Frequency  Constant    Aggravating Factors   weatbearing    Pain Relieving Factors  rest    Effect of Pain on Daily Activities  limits mobility         PATIENT INFORMATION: This Evaluation form will serve as the LMN for the following suppliers:  Supplier: [employer] Contact Person: Deberah Pelton KHIPR:[phone]   Reason for Referral: Patient/caregiver Goals: Patient was seen for  face-to-face evaluation for new power wheelchair.  Also present was Deberah Pelton from [facility]  to discuss recommendations and wheelchair options.  Further paperwork was completed and sent to vendor.  Patient appears to qualify for power mobility device at this time per objective findings.   MEDICAL HISTORY: Diagnosis: L BKA, weakness Primary Diagnosis Onset: 05/2019 is surgery date _0 Progressive Disease Relevant Past and Future Surgeries: L knee BKA ( 05/2019),  Height: 5 ft 7 inch Weight: 290 lb Explain and recent changes or trends in weight: Gained weight recently due to inactivity s/p amputation.   Relevant History including falls:  Arthritis in hands make her limited in grasp and mobility with UE's.  PMH includes anemia, anxiety, arthritis, back pain, bradycardia, CHF, CKD stage II, DM, diabetic nephropathy, dyspnea, GERD, HLD, hyperparathyroidism, hypotension, legally blind in left eye, lymphedema, obesity, onychomycosis. Left BKA performed 05/05/19, noted in history that patient had a fall after procedure injuring the anterior portion of the suture line. Currently using a manual chair to get around the home. At this time she can't get into bathroom due to wheelchair, is waiting for a handicap apartment.      HOME ENVIRONMENT: _1 House  _2 Condo/town home  _3 Apartment  _4 Assisted Living    _5 Lives Alone _6  Lives with Others                                                    Hours with caregiver: lives with ex-husband  _7 Home is accessible to patient            Stairs  _8 Yes _9  No     Ramp _10 Yes _11 No Comments:   In process to get to a handicap accessible apartment.  Is able to get around her current apartment but can't get into the bathroom.    COMMUNITY ADL: TRANSPORTATION: _12 Car    _13 Van    <[id number]>_2<\/[id number]>_46 Public Transportation    _15 Adapted w/c Lift   _16 Ambulance   _17 Other:       _18 Sits in wheelchair during transport  Employment/School:     Specific requirements pertaining to mobility  Other:     Ex husband drives her.                                   FUNCTIONAL/SENSORY PROCESSING SKILLS:  Handedness:   _0 Right     _1 Left    _2 NA  Comments:                                 Functional Processing Skills for Wheeled Mobility _3 Processing Skills are adequate for safe wheelchair operation  Areas of concern than may interfere with safe operation of wheelchair Description of problem   _4  Attention to environment     _5 Judgment     _6  Hearing  _7  Vision or visual processing    _8 Motor Planning  _9  Fluctuations in Behavior                                                   VERBAL COMMUNICATION: _10 WFL receptive _11  WFL expressive _12 Understandable  _13 Difficult to understand  _14 non-communicative _15  Uses an augmented communication device    CURRENT SEATING / MOBILITY: Current Mobility Base:   _16 None  _17 Dependent  _18 Manual  _19 Scooter  _20 Power   Type of Control:                       Manufacturer:                         Size:   standard                      Age:  58 year old                         Current Condition of Mobility Base:   Worn, does not fit patient                                                                                                                  Current Wheelchair components:   Manual chair  Describe posture in present seating system:      Does not fit in chair, has to have handles off to fit in it.                                                                      SENSATION and SKIN ISSUES: Sensation '[]'$ Intact '[x]'$ Impaired '[]'$ Absent   Level of sensation:   R foot slight limitation of feeling, limited R knee due to swelling.                         Pressure Relief: Able to perform effective pressure relief :   '[x]'$ Yes  '[]'$  No Method:      Push up off chair, grab  bar, or ex husband helps transfer from lower surfaces or surfaces that don't have a grab bar.                                                                       If not, Why?:                                                                          Skin Issues/Skin Integrity Current Skin Issues   '[]'$ Yes '[x]'$ No  '[]'$ Intact '[]'$  Red area '[]'$  Open Area  '[]'$ Scar Tissue '[]'$ At risk from prolonged sitting  Where                              History of Skin Issues   '[x]'$ Yes '[]'$ No  Where   L ankle                                      When      Prior to amputation                                          Hx of skin flap surgeries '[]'$ Yes '[]'$ No  Where                  When                                                  Limited sitting tolerance '[]'$ Yes '[x]'$ No Hours spent sitting in wheelchair daily:  Sits majority of the day. Has to sit when not wearing prosthetic limb. Unable to stand or walk for  long distances.                                                        Complaint of Pain:  Please describe:        R knee pain with weightbearing: was supposed to have a total knee replacement prior to amputation of LLE. Back pain with standing with prosthesis.                                                                                                     Swelling/Edema:    R knee                                                                                                                                              ADL STATUS (in reference to wheelchair use):  Indep Assist Unable Indep with Equip Not assessed Comments  Dressing                                 x                                        Eating         x                                                                                                                     Toileting                 x  Bathing                x                                                                                                                       Grooming/ Hygiene                                         x                                                                                     Meal Prep                 x                                                                                                         IADLS                  x                                                                                                Bowel Management: '[x]'$ Continent  '[x]'$ Incontinent  '[]'$ Accidents Comments: sometimes incontinent in bed. Can't get up quick enough to make it to restroom.                                               Bladder Management: '[x]'$ Continent  '[x]'$ Incontinent  '[]'$ Accidents Comments:             sometimes incontinent in bed. Can't get up quick enough to make it to  restroom.                                   WHEELCHAIR SKILLS: Manual w/c Propulsion: '[]'$ UE or LE strength and endurance sufficient to participate in ADLs using manual wheelchair Arm :  '[]'$ left '[]'$ right  '[]'$ Both                                   Foot:   '[]'$ left '[]'$ right  '[]'$ Both  Distance:   Operate Scooter: '[]'$  Strength, hand grip, balance and transfer appropriate for use '[]'$ Living environment is accessible for use of scooter  Operate Power w/c:  '[x]'$  Std. Joystick   '[]'$  Alternative Controls Indep '[x]'$  Assist '[]'$  Dependent/ Unable '[]'$  N/A '[]'$  '[x]'$ Safe          '[]'$  Functional      Distance:                Bed confined without wheelchair '[x]'$  Yes if not able to use prosthesis.  '[]'$  No, able to do short distances with prosthesis with high pain, not functional at this time.    STRENGTH/RANGE OF MOTION:  Range of Motion Strength  Shoulder                  WFL: limited extension bilaterally                                               3+/5                                             Elbow        WFL                                                          3+/5                                               Wrist/Hand                          Hands limited by pain, wrist limited by pain.                                                          R17 lb L 18                                                       Hip  Limited hip extension bilaterally; WFL              3+/5, hip extension and adduction 2+/5                                                   Knee              WFL: limited by body habitus                                                                                                              3+/5  Ankle L : NA R WFL R:     3+/5                                                                  MOBILITY/BALANCE:  '[]'$  Patient is totally dependent for mobility                                                                                               Balance Transfers Ambulation  Sitting Balance: Standing Balance: '[]'$  Independent '[]'$  Independent/Modified Independent  '[]'$  WFL     '[]'$  WFL '[x]'$  Supervision with prosthetic and raised surface '[]'$  Supervision  '[x]'$  Uses UE for balance  '[x]'$  Supervision '[x]'$  Min Assist '[]'$  Ambulates with Assist                           '[]'$  Min Assist '[x]'$  Min assist with prosthetic limb '[]'$  Mod Assist '[x]'$  Ambulates with Device:  '[x]'$  RW   '[]'$  StW   '[]'$  Cane   '[x]'$      L prosthesis           '[]'$  Mod Assist '[]'$  Mod assist '[]'$  Max assist   '[]'$  Max Assist '[]'$  Max assist '[]'$  Dependent '[x]'$  Indep. Short Distance Only  '[]'$  Unable '[]'$  Unable '[]'$  Lift / Sling Required Distance (in feet)  30                           '[x]'$  Sliding board when not wearing prosthesis '[]'$  Unable to Ambulate: (Explain:  Cardio Status:  '[]'$ Intact  '[x]'$  Impaired   '[]'$  NA  Respiratory Status:  '[x]'$ Intact   '[]'$ Impaired   '[]'$ NA                                     Orthotics/Prosthetics:    L BKA                                                                      Comments (Address manual vs power w/c vs scooter):     Patient is a pleasant 58 year old female who presents for a power wheelchair. She is s/p L BKA 05/2019. She is limited in ambulation and standing due to severe R knee pain limiting her functional mobility.She is only able to ambulate short distances with use of prosthesis and RW with support at this time. The patient is unable to reach the restroom in time with her current mobility status and use of a power chair will allow for more functional ADL and iADLs.  She is unable to propel a manual wheelchair due to limited hand control/pain making propelling not feasible at this time. Patient would benefit from the [employer] 8 HD power chair to improve functional mobility and safe negotiation of home environment. Patient will require an adjustable back and heavy duty frame for functional transfers , weight shift, and safety due to body habitus. A Left LE amputee adapter is required for her new BKA to allow for functional mobility without use of prosthesis.                                                          Anterior / Posterior Obliquity Rotation-Pelvis  PELVIS    '[x]'$ Neutral  '[]'$  Posterior  '[]'$  Anterior     '[x]'$ WFL  '[]'$ Right Elevated  '[]'$ Left Elevated   '[x]'$ WFL  '[]'$ Right Anterior '[]'$   Left Anterior    '[]'$  Fixed '[]'$  Partly Flexible '[]'$  Flexible  '[]'$  Other  '[]'$  Fixed  '[]'$  Partly Flexible  '[]'$  Flexible '[]'$  Other  '[]'$  Fixed  '[]'$  Partly Flexible  '[]'$  Flexible '[]'$  Other  TRUNK '[x]'$ WFL '[]'$ Thoracic Kyphosis '[]'$ Lumbar Lordosis   '[x]'$  WFL '[]'$ Convex Right '[]'$ Convex Left   '[]'$ c-curve '[]'$ s-curve '[]'$ multiple  '[]'$  Neutral '[]'$  Left-anterior '[]'$  Right-anterior    '[]'$  Fixed '[]'$  Flexible '[]'$  Partly Flexible       Other  '[]'$  Fixed '[]'$  Flexible '[]'$  Partly Flexible '[]'$  Other  '[]'$  Fixed           '[]'$  Flexible '[]'$  Partly Flexible '[]'$  Other   Position Windswept   HIPS  '[x]'$  Neutral '[]'$  Abduct '[]'$  ADduct '[x]'$  Neutral '[]'$  Right '[]'$  Left       '[]'$  Fixed  '[]'$  Partly Flexible             '[]'$  Dislocated '[]'$   Flexible '[]'$  Subluxed    '[]'$  Fixed '[x]'$  Partly Flexible  '[]'$  Flexible '[]'$  Other              Foot Positioning Knee Positioning   Knees and  Feet  '[x]'$  WFL '[]'$ Left '[]'$ Right '[x]'$   WFL '[]'$ Left '[]'$ Right   KNEES ROM concerns: ROM concerns:   & Dorsi-Flexed                    '[]'$ Lt '[]'$ Rt                                  FEET Plantar Flexed                  '[]'$ Lt '[]'$ Rt     Inversion                    '[]'$ Lt '[]'$ Rt     Eversion                    '[]'$ Lt '[]'$ Rt    HEAD '[x]'$  Functional '[x]'$  Good Head Control   & '[]'$  Flexed         '[]'$  Extended '[]'$  Adequate Head Control   NECK '[]'$  Rotated  Lt  '[]'$  Lat Flexed Lt '[]'$  Rotated  Rt '[]'$  Lat Flexed Rt '[]'$  Limited Head Control    '[]'$  Cervical Hyperextension '[]'$  Absent  Head Control    SHOULDERS ELBOWS WRIST& HAND         Left     Right    Left     Right  U/E '[x]'$ Functional  Left            '[x]'$ Functional  Right                                 '[]'$ Fisting             '[]'$ Fisting     '[]'$ elevated Left '[]'$ depressed  Left '[]'$ elevated Right '[]'$ depressed  Right   Arthritis, limited grip    '[]'$ protracted Left '[]'$ retracted Left '[]'$ protracted Right '[]'$ retracted Right '[]'$ subluxed  Left              '[]'$ subluxed  Right         Goals for Wheelchair Mobility  '[x]'$  Independence with mobility in the home with motor related ADLs (MRADLs)  '[x]'$  Independence with MRADLs in the community '[]'$  Provide dependent mobility  '[]'$  Provide recline     '[]'$ Provide tilt   Goals for Seating system '[x]'$  Optimize pressure distribution '[x]'$  Provide support needed to facilitate function or safety '[x]'$  Provide corrective forces to assist with maintaining or improving posture '[]'$  Accommodate client's posture: current seated postures and positions are not flexible or will not tolerate corrective forces '[x]'$  Client to be independent with relieving pressure in the wheelchair '[]'$ Enhance physiological function such as breathing, swallowing, digestion  Simulation ideas/Equipment trials:       [employer] 8 HD                                                                                           State why other equipment was unsuccessful:  She is limited in ambulation and standing due to severe R knee pain limiting her functional mobility.She is only able to ambulate short distances with use of prosthesis and RW with support at this time. The patient is unable to reach the restroom in time with her current mobility status and use of a power chair will allow for more functional ADL and iADLs.  She is unable to propel a manual wheelchair due to limited hand control/pain making propelling not feasible at this time. Patient would benefit from the [employer] 8 HD power chair to improve functional mobility and safe negotiation of home environment. Patient will require an adjustable back and heavy duty frame for functional transfers , weight shift, and safety due to body habitus. A Left LE amputee adapter is required for her new BKA to allow for functional mobility without use of prosthesis.             MOBILITY BASE RECOMMENDATIONS and JUSTIFICATION: MOBILITY COMPONENT JUSTIFICATION  Manufacturer:     [employer]      Model:      [employer] 8 HD        Size: Width  20 wide         Seat Depth  20 deep           '[x]'$ provide transport from point A to B '[x]'$ promote Indep mobility  '[x]'$ is not a safe, functional ambulator '[x]'$ walker or cane inadequate for functional distances '[]'$ non-standard width/depth necessary to accommodate anatomical measurement '[]'$                             '[]'$ Manual Mobility Base '[]'$ non-functional ambulator    '[]'$ Scooter/POV  '[]'$ can safely operate  '[]'$ can safely transfer   '[]'$ has adequate trunk stability  '[]'$ cannot functionally propel manual w/c  '[x]'$ Power Mobility Base  '[x]'$ non-ambulatory without prosthesis and/or functional distance '[x]'$ cannot functionally propel manual wheelchair  '[x]'$  cannot functionally and safely operate scooter/POV '[]'$ can safely  operate and willing to  '[]'$ Stroller Base '[]'$ infant/child  '[]'$ unable to propel manual wheelchair '[]'$ allows for growth '[]'$ non-functional ambulator '[]'$ non-functional UE '[]'$ Indep mobility is not a goal at this time  '[]'$ Tilt  '[]'$ Forward                   '[]'$ Backward                  '[]'$ Powered tilt              '[]'$ Manual tilt  '[]'$ change position against gravitational force on head and shoulders  '[]'$ change position for pressure relief/cannot weight shift '[]'$ transfers  '[]'$ management of tone '[]'$ rest periods '[]'$ control edema '[]'$ facilitate postural control  '[]'$                                       '[]'$ Recline  '[]'$ Power recline on power base '[]'$ Manual recline on manual base  '[]'$ accommodate femur to back angle  '[]'$ bring to full recline for ADL care  '[]'$ change position for pressure relief/cannot weight shift '[]'$ rest periods '[]'$ repositioning for transfers or clothing/diaper /catheter changes '[]'$ head positioning  '[]'$ Lighter weight required '[]'$ self- propulsion  '[]'$ lifting '[]'$                                                 '[x]'$ Heavy Duty required [  x]user weight greater than 250# '[]'$ extreme tone/ over active movement '[]'$ broken frame on previous chair '[]'$                                     '[x]'$  Back  '[x]'$  Angle Adjustable '[]'$  Custom molded                           '[]'$ postural control '[]'$ control of tone/spasticity '[x]'$ accommodation of range of motion '[]'$ UE functional control '[]'$ accommodation for seating system '[]'$                                          '[]'$ provide lateral trunk support '[]'$ accommodate deformity '[x]'$ provide posterior trunk support '[]'$ provide lumbar/sacral support '[]'$ support trunk in midline '[]'$ Pressure relief over spinal processes  '[]'$  Seat Cushion                       '[]'$ impaired sensation  '[]'$ decubitus ulcers present '[]'$ history of pressure ulceration '[]'$ prevent pelvic extension '[]'$ low maintenance  '[]'$ stabilize pelvis  '[]'$ accommodate obliquity '[]'$ accommodate multiple deformity '[]'$ neutralize lower extremity position '[]'$ increase  pressure distribution '[]'$                                           '[]'$  Pelvic/thigh support  '[]'$  Lateral thigh guide '[]'$  Distal medial pad  '[]'$  Distal lateral pad '[]'$  pelvis in neutral '[]'$ accommodate pelvis '[]'$  position upper legs '[]'$  alignment '[]'$  accommodate ROM '[]'$  decrease adduction '[]'$ accommodate tone '[]'$ removable for transfers '[]'$ decrease abduction  '[]'$  Lateral trunk Supports '[]'$  Lt     '[]'$  Rt '[]'$ decrease lateral trunk leaning '[]'$ control tone '[]'$ contour for increased contact '[]'$ safety  '[]'$ accommodate asymmetry '[]'$                                                 '[]'$  Mounting hardware  '[]'$ lateral trunk supports  '[]'$ back   '[]'$ seat '[]'$ headrest      '[]'$  thigh support '[]'$ fixed   '[]'$ swing away '[]'$ attach seat platform/cushion to w/c frame '[]'$ attach back cushion to w/c frame '[]'$ mount postural supports '[]'$ mount headrest  '[]'$ swing medial thigh support away '[]'$ swing lateral supports away for transfers  '[]'$                                                     Armrests  '[]'$ fixed '[x]'$ adjustable height '[]'$ removable   '[]'$ swing away  '[x]'$ flip back   '[]'$ reclining '[]'$ full length pads '[]'$ desk    '[]'$ pads tubular  '[x]'$ provide support with elbow at 90   '[]'$ provide support for w/c tray '[x]'$ change of height/angles for variable activities '[x]'$ remove for transfers '[x]'$ allow to come closer to table top '[x]'$ remove for access to tables '[]'$                                               Hangers/ Leg rests  '[]'$   60 '[]'$ 70 '[]'$ 90 '[]'$ elevating '[]'$ heavy duty  '[]'$ articulating '[]'$ fixed '[]'$ lift off '[]'$ swing away     '[]'$ power '[]'$ provide LE support  '[]'$ accommodate to hamstring tightness '[]'$ elevate legs during recline   '[]'$ provide change in position for Legs '[]'$ Maintain placement of feet on footplate '[]'$ durability '[]'$ enable transfers '[]'$ decrease edema '[]'$ Accommodate lower leg length '[]'$                                         Foot support Footplate    '[]'$ Lt  '[]'$  Rt  '[x]'$  Center mount '[x]'$ flip up                            '[]'$ depth/angle adjustable '[x]'$ Amputee adapter    '[x]'$  Lt      '[]'$  Rt '[x]'$ provide foot support '[]'$ accommodate to ankle ROM '[x]'$ transfers '[x]'$ Provide support for residual extremity '[]'$  allow foot to go under wheelchair base '[]'$  decrease tone  '[]'$                                                 '[]'$  Ankle strap/heel loops '[]'$ support foot on foot support '[]'$ decrease extraneous movement '[]'$ provide input to heel  '[]'$ protect foot  Tires: '[]'$ pneumatic  '[x]'$ flat free inserts  '[]'$ solid  '[x]'$ decrease maintenance  '[x]'$ prevent frequent flats '[x]'$ increase shock absorbency '[]'$ decrease pain from road shock '[]'$ decrease spasms from road shock '[]'$                                              '[]'$  Headrest  '[]'$ provide posterior head support '[]'$ provide posterior neck support '[]'$ provide lateral head support '[]'$ provide anterior head support '[]'$ support during tilt and recline '[]'$ improve feeding   '[]'$ improve respiration '[]'$ placement of switches '[]'$ safety  '[]'$ accommodate ROM  '[]'$ accommodate tone '[]'$ improve visual orientation  '[]'$  Anterior chest strap '[]'$  Vest '[]'$  Shoulder retractors  '[]'$ decrease forward movement of shoulder '[]'$ accommodation of TLSO '[]'$ decrease forward movement of trunk '[]'$ decrease shoulder elevation '[]'$ added abdominal support '[]'$ alignment '[]'$ assistance with shoulder control  '[]'$                                               Pelvic Positioner '[x]'$ Belt '[]'$ SubASIS bar '[]'$ Dual Pull '[]'$ stabilize tone '[x]'$ decrease falling out of chair/ **will not Decrease potential for sliding due to pelvic tilting '[]'$ prevent excessive rotation '[]'$ pad for protection over boney prominence '[]'$ prominence comfort '[]'$ special pull angle to control rotation '[]'$                                                  Upper ExtremitySupport  '[]'$ L   '[]'$  R '[]'$ Arm trough   '[]'$ hand support '[]'$  tray       '[]'$ full tray '[]'$ swivel mount '[]'$ decrease edema      '[]'$ decrease subluxation   '[]'$ control tone   '[]'$ placement for AAC/Computer/EADL '[]'$ decrease gravitational pull on shoulders '[]'$ provide midline positioning '[]'$ provide support to increase UE  function '[]'$ provide hand support in natural position '[]'$ provide work  surface   POWER WHEELCHAIR CONTROLS  '[x]'$ Proportional  '[]'$ Non-Proportional Type                                      '[]'$ Left  '[x]'$ Right '[x]'$ provides access for controlling wheelchair   '[]'$ lacks motor control to operate proportional drive control '[]'$ unable to understand proportional controls  Actuator Control Module  '[]'$ Single  '[]'$ Multiple   '[]'$ Allow the client to operate the power seat function(s) through the joystick control   '[]'$ Safety Reset Switches '[]'$ Used to change modes and stop the wheelchair when driving in latch mode    '[]'$ Upgraded Electronics   '[]'$ programming for accurate control '[]'$ progressive Disease/changing condition '[]'$ non-proportional drive control needed '[]'$ Needed in order to operate power seat functions through joystick control   '[]'$ Display box '[]'$ Allows user to see in which mode and drive the wheelchair is set  '[]'$ necessary for alternate controls    '[]'$ Digital interface electronics '[]'$ Allows w/c to operate when using alternative drive controls  '[]'$ ASL Head Array '[]'$ Allows client to operate wheelchair  through switches placed in tri-panel headrest  '[]'$ Sip and puff with tubing kit '[]'$ needed to operate sip and puff drive controls  '[]'$ Upgraded tracking electronics '[]'$ increase safety when driving '[]'$ correct tracking when on uneven surfaces  '[x]'$ Mount for switches or joystick '[]'$ Attaches switches to w/c  '[x]'$ Swing away for access or transfers '[]'$ midline for optimal placement '[]'$ provides for consistent access  '[]'$ Attendant controlled joystick plus mount '[]'$ safety '[]'$ long distance driving '[]'$ operation of seat functions '[]'$ compliance with transportation regulations '[]'$                                             Rear wheel placement/Axle adjustability '[]'$ None '[]'$ semi adjustable '[]'$ fully adjustable  '[]'$ improved UE access to wheels '[]'$ improved stability '[]'$ changing angle in space for improvement of postural stability '[]'$ 1-arm drive access '[]'$ amputee  pad placement '[]'$                                Wheel rims/ hand rims  '[]'$ metal   '[]'$ plastic coated '[]'$ oblique projections           '[]'$ vertical projections '[]'$ Provide ability to propel manual wheelchair  '[]'$  Increase self-propulsion with hand weakness/decreased grasp  Push handles '[]'$ extended   '[]'$ angle adjustable              '[]'$ standard '[]'$ caregiver access '[]'$ caregiver assist '[]'$ allows "hooking" to enable increased ability to perform ADLs or maintain balance  One armed device   '[]'$ Lt   '[]'$ Rt '[]'$ enable propulsion of manual wheelchair with one arm   '[]'$                                            Brake/wheel lock extension '[]'$  Lt   '[]'$  Rt '[]'$ increase indep in applying wheel locks   '[]'$ Side guards '[]'$ prevent clothing getting caught in wheel or becoming soiled '[]'$  prevent skin tears/abrasions  Battery:     Group 22x2                                        '[x]'$ to power wheelchair  Other:    Engineer, agricultural                                                                              The above equipment has a life- long use expectancy. Growth and changes in medical and/or functional conditions would be the exceptions. This is to certify that the therapist has no financial relationship with durable medical provider or manufacturer. The therapist will not receive remuneration of any kind for the equipment recommended in this evaluation.   Patient has mobility limitation that significantly impairs safe, timely participation in one or more mobility related ADL's. (bathing, toileting, feeding, dressing, grooming, moving from room to room)  '[x]'$  Yes '[]'$  No  Will mobility device sufficiently improve ability to participate and/or be aided in participation of MRADL's?      '[x]'$  Yes '[]'$  No  Can limitation be compensated for with use of a cane or walker?                                    '[]'$  Yes '[x]'$  No  Does patient or caregiver  demonstrate ability/potential ability & willingness to safely use the mobility device?    '[x]'$  Yes '[]'$  No  Does patient's home environment support use of recommended mobility device?            '[x]'$  Yes '[]'$  No  Does patient have sufficient upper extremity function necessary to functionally propel a manual wheelchair?     '[]'$  Yes '[x]'$  No  Does patient have sufficient strength and trunk stability to safely operate a POV (scooter)?                                  '[]'$  Yes '[x]'$  No  Does patient need additional features/benefits provided by a power wheelchair for MRADL's in the home?        '[x]'$  Yes '[]'$  No  Does the patient demonstrate the ability to safely use a power wheelchair?                   '[x]'$  Yes '[]'$  No     Physician's Name Printed:                                                        Physician's Signature:  Date:     This is to certify that I, the above signed therapist have the following affiliations: '[]'$  This DME provider '[]'$  Manufacturer of recommended equipment '[]'$  Patient's long term care facility '[x]'$  None of the above  Therapist Name/Signature:     Janna Arch, PT, DPT  Date: 09/20/19               Objective measurements completed on examination: See above findings.              PT Education - 09/20/19 1051    Education Details  wheelchair eval    Person(s) Educated  Patient    Methods  Explanation;Demonstration    Comprehension  Verbalized understanding;Returned demonstration       PT Short Term Goals - 09/20/19 1054      PT SHORT TERM GOAL #1   Title  Pt and caregivers will understand PT recommendation and appropriate/safe use for wheelchair and seating for home use.    Baseline  11/16: patient understands    Time  1    Period  Days    Status  Achieved        PT Long Term Goals - 09/20/19 1054      PT LONG TERM GOAL #1   Title  Pt and caregivers will understand PT recommendation and  appropriate/safe use for wheelchair and seating for home use.    Baseline  understands    Time  1    Period  Days    Status  Achieved             Plan - 09/20/19 1053    Clinical Impression Statement  Patient is a pleasant 58 year old female who presents for a power wheelchair. She is s/p L BKA 05/2019. She is limited in ambulation and standing due to severe R knee pain limiting her functional mobility.She is only able to ambulate short distances with use of prosthesis and RW with support at this time. The patient is unable to reach the restroom in time with her current mobility status and use of a power chair will allow for more functional ADL and iADLs.  She is unable to propel a manual wheelchair due to limited hand control/pain making propelling not feasible at this time. Patient would benefit from the [place] 8 HD power chair to improve functional mobility and safe negotiation of home environment. Patient will require an adjustable back and heavy duty frame for functional transfers , weight shift, and safety due to body habitus. A Left LE amputee adapter is required for her new BKA to allow for functional mobility without use of prosthesis.    Personal Factors and Comorbidities  Age;Comorbidity 3+;Education;Finances;Fitness;Past/Current Experience;Social Background;Time since onset of injury/illness/exacerbation;Other    Comorbidities  anemia, anxiety, arthritis, back pain, bradycardia, CHF, CKD stage III, DM, diabetic nephropathy, dyspnea, GERD, heart murmer, HLD, hyperparathyroidism, HTN, blind in L eye, obesity, oncychomycosis    Examination-Activity Limitations  Bathing;Bed Mobility;Caring for Others;Bend;Carry;Dressing;Continence;Hygiene/Grooming;Stairs;Squat;Reach Overhead;Locomotion Level;Lift;Stand;Toileting;Transfers    Examination-Participation Restrictions  Church;Cleaning;Community Activity;Interpersonal Relationship;Driving;Laundry;Volunteer;Shop;Personal Finances;Meal  Prep;Yard Work;Other    PT Frequency  One time visit       Patient will benefit from skilled therapeutic intervention in order to improve the following deficits and impairments:     Visit Diagnosis: Other abnormalities of gait and mobility  Muscle weakness (generalized)     Problem List Patient Active Problem List   Diagnosis Date Noted  . Chronic pain syndrome 07/13/2019  . Wound dehiscence 05/29/2019  . Hx of BKA, left ([place]) 05/24/2019  . PVD (peripheral vascular disease) ([place]) 05/13/2019  . Chronic osteomyelitis of ankle and foot, left ([place]) 05/05/2019  . Chronic diastolic CHF (congestive heart failure) ([place]) 04/19/2019  . Leg edema 04/19/2019  . Acute bacterial endocarditis 04/19/2019  . Chest pain of  uncertain etiology 25/36/6440  . Ulcer of left ankle ([place]) 04/14/2019  . Ankle deformity, left 01/21/2019  . Diabetic polyneuropathy associated with type 2 diabetes mellitus ([place]) 01/21/2019  . Aortic valve endocarditis   . Bacteremia due to Streptococcus 12/24/2018  . Diabetic ulcer of left ankle associated with diabetes mellitus due to underlying condition, with fat layer exposed ([place]) 11/09/2018  . Malignant hypertension (arteriolar nephrosclerosis), stage 1-4 or unspecified chronic kidney disease 07/15/2018  . Stage 4 chronic kidney disease ([place]) 07/15/2018  . Morbid obesity with BMI of 45.0-49.9, adult ([place]) 07/15/2018  . Excessive daytime sleepiness 07/15/2018  . Other fatigue 03/11/2018  . Essential hypertension 03/11/2018  . Vitamin D deficiency 03/11/2018  . Congestive heart failure ([place]) 03/11/2018  . Primary osteoarthritis of right knee 01/06/2018  . Uncontrolled type 2 diabetes mellitus with polyneuropathy ([place]) 10/03/2017  . Chronic pain of right knee 10/02/2017  . Obesity 10/02/2017  . Charcot's joint of left foot 08/29/2017  . Lymphedema 09/06/2016  . Hyponatremia with extracellular fluid depletion 02/02/2016  . Nausea with vomiting, unspecified  02/02/2016  . Closed nondisplaced fracture of right patella 08/23/2015  . Acute cystitis without hematuria 08/14/2014  . Colitis 08/14/2014  . HTN (hypertension), malignant 08/14/2014  . Acute renal failure superimposed on stage 3 chronic kidney disease ([place]) 06/26/2014  . Hyperparathyroidism, unspecified ([place]) 02/24/2014  . Allergic rhinitis 02/22/2014  . Onychomycosis 02/22/2014  . Anxiety 11/05/2013  . Bradycardia 05/14/2013  . Gastro-esophageal reflux disease without esophagitis 04/05/2013  . Anemia in other chronic diseases classified elsewhere 01/27/2013   Janna Arch, PT, DPT   09/20/2019, 10:55 AM  [place] [facility] SERVICES [address] [place], [place], [address] Phone: [phone]   Fax:  [phone]  Name: Rachel Holmes MRN: [id number] Date of Birth: 1961-08-13

## 2019-09-22 ENCOUNTER — Ambulatory Visit: Payer: Medicare HMO

## 2019-09-22 ENCOUNTER — Other Ambulatory Visit: Payer: Self-pay

## 2019-09-22 DIAGNOSIS — R2689 Other abnormalities of gait and mobility: Secondary | ICD-10-CM

## 2019-09-22 DIAGNOSIS — M6281 Muscle weakness (generalized): Secondary | ICD-10-CM

## 2019-09-22 DIAGNOSIS — R2681 Unsteadiness on feet: Secondary | ICD-10-CM

## 2019-09-22 NOTE — Therapy (Signed)
[place] MAIN [facility] SERVICES [address] [place], [place], [address] Phone: [phone]   Fax:  [phone]  Physical Therapy Treatment  Patient Details  Name: Rachel Holmes MRN: [id number] Date of Birth: 09/29/1961 Referring Provider (PT): Eulogio Ditch    Encounter Date: 09/22/2019  PT End of Session - 09/23/19 0916    Visit Number  9    Number of Visits  16    Date for PT Re-Evaluation  10/06/19    Authorization Type  9/10 eval 08/11/19    PT Start Time  0932    PT Stop Time  [id number]    PT Time Calculation (min)  42 min    Equipment Utilized During Treatment  Gait belt;Other (comment)   L prosthesis   Activity Tolerance  Patient tolerated treatment well;Patient limited by fatigue    Behavior During Therapy  [facility] for tasks assessed/performed       Past Medical History:  Diagnosis Date  . Allergic rhinitis   . Allergy   . Anemia   . Anxiety   . Arthritis   . Back pain   . Bradycardia   . Breast mass    Patient can no longer palpate specific masses but showed tech general area of concern  . CHF (congestive heart failure) ([place])   . CKD (chronic kidney disease)    STAGE 3  . Constipation   . Diabetes mellitus without complication ([place])   . Diabetic nephropathy ([place])   . Dyspnea   . GERD (gastroesophageal reflux disease)   . Heart murmur   . HLD (hyperlipidemia)   . Hyperparathyroidism ([place])   . Hypertension   . Joint pain   . Leg edema   . Legally blind in left eye, as defined in [place]   . Lymphedema   . Obesity   . Onychomycosis     Past Surgical History:  Procedure Laterality Date  . ABDOMINAL HYSTERECTOMY    . AMPUTATION Left 05/05/2019   Procedure: AMPUTATION BELOW KNEE;  Surgeon: Katha Cabal, MD;  Location: [facility] ORS;  Service: Vascular;  Laterality: Left;  . BREAST BIOPSY Left 2014   FNA 12:00 position - Negative  . EYE SURGERY Left 2007   removed a lens, no lens implanted  . IR FLUORO GUIDE CV LINE  RIGHT  12/29/2018  . IR REMOVAL TUN CV CATH W/O FL  02/19/2019  . IR US GUIDE VASC ACCESS RIGHT  12/29/2018  . TEE WITHOUT CARDIOVERSION N/A 12/28/2018   Procedure: TRANSESOPHAGEAL ECHOCARDIOGRAM (TEE);  Surgeon: Sueanne Margarita, MD;  Location: [facility] ENDOSCOPY;  Service: Cardiovascular;  Laterality: N/A;  . VAGINAL HYSTERECTOMY     abdominal hyst, not a vaginal hyst    There were no vitals filed for this visit.  Subjective Assessment - 09/23/19 0747    Subjective  Patient reports she has been practicing her walking at home. Reports no falls or LOB since last session but has noticed a bruise on residual limb.    Pertinent History  Patient is a pleasant 59 year old female who presents for gait training/ stability for L BKA. Recently obtained her prosthesis last Friday 08/06/19 and has not worn it since the prosthetists office. PMH includes anemia, anxiety, arthritis, back pain, bradycardia, CHF, CKD stage II, DM, diabetic nephropathy, dyspnea, GERD, HLD, hyperparathyroidism, hypotension, legally blind in left eye, lymphedema, obesity, onychomycosis. Left BKA performed 05/05/19, noted in history that patient had a fall after procedure injuring the anterior portion of  the suture line. Currently using a manual chair to get around the home. At this time she can't get into bathroom due to wheelchair, is waiting for a handicap apartment.    Limitations  Lifting;Standing;Walking;House hold activities    How long can you sit comfortably?  n/a    How long can you stand comfortably?  only stood once with prosthetist    How long can you walk comfortably?  not able to walk yet.    Patient Stated Goals  to walk again with prosthesis    Currently in Pain?  Yes    Pain Score  5     Pain Location  Knee    Pain Orientation  Right    Pain Descriptors / Indicators  Aching    Pain Type  Chronic pain    Pain Onset  More than a month ago    Pain Frequency  Constant         Treatment:  Inspect medial aspect of  patients residual leg: removal of prosthesis, socks, and shrinker: noted 2" bruise on medial mid aspect of residual limb. Able to define point inside prosthesis responsible on side of prosthetic. Patient agreeable to call physician for additional fitting of prosthetic. Re-educated on proper sock wear and how too many socks is just as bad as not enough due to increased pressure causing breakdown/bruising. Patient agreeable to take one of her 3 socks off, reports no pain now upon standing with decreased sock thickness  sit to stand focus on equal foot placement from varying height of plinth table. Patient frequently picks up RLE and brings more posterior causing weigh tshift and pain in R knee. Educated on proper alignment for optimal transfer with decreasing pain. Patient demonstrated understanding with lowering height x 15.    Ambulating with decreased UE support in // bars. Able to ambulate with LUE support due to R knee pain best, requires cueing for L hand, R foot L foot sequencing. 20x length of // bars. Frequent seated rest breaks due to R knee pain. Use of 5lb ankle weight on R foot for reduction of pain. CGA/ min A for weight shift and placement of LE's and UE for neutralizing body mechanics for optimal muscle recruitment and stability.   Pt educated throughout session about proper posture and technique with exercises. Improved exercise technique, movement at target joints, use of target muscles after min to mod verbal, visual, tactile cues.   Patient is progressing with decreased need for UE assistance with ambulation. Initially attempt to perform SUE support for ambulation was unsuccessful due to R knee pain. Upon switching to opposite UE support she was able to perform multiple consecutive laps with max verbal cueing for sequencing.  Patient agreeable to call prosthetist for fitting due to bruising, re-education required for sock fit. Patient will benefit from skilled physical therapy for  ambulation and transfers with prosthesis, strength and stability, and increase stability with functional mobility for quality of life and decreased falls risk                        PT Education - 09/23/19 0752    Education Details  exercise technique, sock fit, prosthetic appointment.    Person(s) Educated  Patient    Methods  Explanation;Demonstration;Tactile cues;Verbal cues    Comprehension  Verbalized understanding;Returned demonstration;Verbal cues required;Tactile cues required       PT Short Term Goals - 09/23/19 0917      PT SHORT TERM  GOAL #1   Title  Patient will be independent in home exercise program to improve strength/mobility for better functional independence with ADLs.    Baseline  10/7: HEP given    Time  2    Period  Weeks    Status  New    Target Date  08/25/19        PT Long Term Goals - 09/23/19 0917      PT LONG TERM GOAL #1   Title  Patient will tolerate static standing for > 5 minutes for standing ADL performance with weight acceptance onto prosthetic limb to improve quality of life and mobility.    Baseline  10/7: 1 minute 39 seconds with heavy UE support on RW    Time  8    Period  Weeks    Status  New    Target Date  10/06/19      PT LONG TERM GOAL #2   Title  Patient will ambulate >100 feet with RW, mod I with right prosthetic exhibiting reciprocal gait pattern with good  management of prosthetic and good safety awareness for improved home ambulation    Baseline  10/7: unable to ambulate    Time  8    Period  Weeks    Status  New    Target Date  10/06/19      PT LONG TERM GOAL #3   Title  Patient (< 40 years old) will complete five times sit to stand test in < 10 seconds indicating an increased LE strength and improved balance.    Baseline  10/7: 21 seconds with heavy BUE support from raised surface    Time  8    Period  Weeks    Status  New    Target Date  10/06/19      PT LONG TERM GOAL #4   Title  Patient will  increase BLE gross strength to 4+/5 as to improve functional strength for independent gait, increased standing tolerance and increased ADL ability.    Baseline  10/7: L 3+/5 R 4-/5    Time  8    Period  Weeks    Status  New    Target Date  10/06/19            Plan - 09/23/19 [id number]    Clinical Impression Statement  Patient is progressing with decreased need for UE assistance with ambulation. Initially attempt to perform SUE support for ambulation was unsuccessful due to R knee pain. Upon switching to opposite UE support she was able to perform multiple consecutive laps with max verbal cueing for sequencing.  Patient agreeable to call prosthetist for fitting due to bruising, re-education required for sock fit. Patient will benefit from skilled physical therapy for ambulation and transfers with prosthesis, strength and stability, and increase stability with functional mobility for quality of life and decreased falls risk    Personal Factors and Comorbidities  Age;Comorbidity 3+;Education;Finances;Fitness;Past/Current Experience;Social Background;Time since onset of injury/illness/exacerbation;Other    Comorbidities  anemia, anxiety, arthritis, back pain, bradycardia, CHF, CKD stage III, DM, diabetic nephropathy, dyspnea, GERD, heart murmer, HLD, hyperparathyroidism, HTN, blind in L eye, obesity, oncychomycosis    Examination-Activity Limitations  Bathing;Bed Mobility;Caring for Others;Bend;Carry;Dressing;Continence;Hygiene/Grooming;Stairs;Squat;Reach Overhead;Locomotion Level;Lift;Stand;Toileting;Transfers    Examination-Participation Restrictions  Church;Cleaning;Community Activity;Interpersonal Relationship;Driving;Laundry;Volunteer;Shop;Personal Finances;Meal Prep;Yard Work;Other    Stability/Clinical Decision Making  Evolving/Moderate complexity    Rehab Potential  Fair    PT Frequency  2x / week    PT Duration  8 weeks  PT Treatment/Interventions  ADLs/Self Care Home Management;Aquatic  Therapy;Biofeedback;Cryotherapy;Electrical Stimulation;Iontophoresis 4mg /ml Dexamethasone;Moist Heat;Traction;Ultrasound;Therapeutic activities;Functional mobility training;Stair training;Gait training;DME Instruction;Therapeutic exercise;Balance training;Neuromuscular re-education;Patient/family education;Manual techniques;Wheelchair Tax adviser;Compression bandaging;Scar mobilization;Passive range of motion;Dry needling;Energy conservation;Splinting;Taping    PT Next Visit Plan  ambulate with RW    PT Home Exercise Plan  prone 10 minutes a day, wear prosthesis 1 hour a day, side lie posterior kick backs 10x    Consulted and Agree with Plan of Care  Patient       Patient will benefit from skilled therapeutic intervention in order to improve the following deficits and impairments:  Abnormal gait, Cardiopulmonary status limiting activity, Decreased activity tolerance, Decreased balance, Decreased knowledge of precautions, Decreased endurance, Decreased coordination, Decreased knowledge of use of DME, Decreased mobility, Decreased range of motion, Decreased safety awareness, Difficulty walking, Decreased strength, Hypomobility, Impaired flexibility, Impaired perceived functional ability, Impaired sensation, Obesity, Prosthetic Dependency, Postural dysfunction, Improper body mechanics, Pain  Visit Diagnosis: Other abnormalities of gait and mobility  Muscle weakness (generalized)  Unsteadiness on feet     Problem List Patient Active Problem List   Diagnosis Date Noted  . Chronic pain syndrome 07/13/2019  . Wound dehiscence 05/29/2019  . Hx of BKA, left ([place]) 05/24/2019  . PVD (peripheral vascular disease) ([place]) 05/13/2019  . Chronic osteomyelitis of ankle and foot, left ([place]) 05/05/2019  . Chronic diastolic CHF (congestive heart failure) ([place]) 04/19/2019  . Leg edema 04/19/2019  . Acute bacterial endocarditis 04/19/2019  . Chest pain of uncertain etiology  [id number]  . Ulcer of left ankle ([place]) 04/14/2019  . Ankle deformity, left 01/21/2019  . Diabetic polyneuropathy associated with type 2 diabetes mellitus ([place]) 01/21/2019  . Aortic valve endocarditis   . Bacteremia due to Streptococcus 12/24/2018  . Diabetic ulcer of left ankle associated with diabetes mellitus due to underlying condition, with fat layer exposed ([place]) 11/09/2018  . Malignant hypertension (arteriolar nephrosclerosis), stage 1-4 or unspecified chronic kidney disease 07/15/2018  . Stage 4 chronic kidney disease ([place]) 07/15/2018  . Morbid obesity with BMI of 45.0-49.9, adult ([place]) 07/15/2018  . Excessive daytime sleepiness 07/15/2018  . Other fatigue 03/11/2018  . Essential hypertension 03/11/2018  . Vitamin D deficiency 03/11/2018  . Congestive heart failure ([place]) 03/11/2018  . Primary osteoarthritis of right knee 01/06/2018  . Uncontrolled type 2 diabetes mellitus with polyneuropathy ([place]) 10/03/2017  . Chronic pain of right knee 10/02/2017  . Obesity 10/02/2017  . Charcot's joint of left foot 08/29/2017  . Lymphedema 09/06/2016  . Hyponatremia with extracellular fluid depletion 02/02/2016  . Nausea with vomiting, unspecified 02/02/2016  . Closed nondisplaced fracture of right patella 08/23/2015  . Acute cystitis without hematuria 08/14/2014  . Colitis 08/14/2014  . HTN (hypertension), malignant 08/14/2014  . Acute renal failure superimposed on stage 3 chronic kidney disease ([place]) 06/26/2014  . Hyperparathyroidism, unspecified ([place]) 02/24/2014  . Allergic rhinitis 02/22/2014  . Onychomycosis 02/22/2014  . Anxiety 11/05/2013  . Bradycardia 05/14/2013  . Gastro-esophageal reflux disease without esophagitis 04/05/2013  . Anemia in other chronic diseases classified elsewhere 01/27/2013   Janna Arch, PT, DPT   09/23/2019, 9:20 AM  [place] MAIN [facility] SERVICES [address] [place], [place], [address] Phone: [phone]    Fax:  [phone]  Name: Rachel Holmes MRN: [id number] Date of Birth: [birth year]

## 2019-09-27 ENCOUNTER — Ambulatory Visit: Payer: Medicare HMO

## 2019-09-27 ENCOUNTER — Other Ambulatory Visit: Payer: Self-pay

## 2019-09-27 DIAGNOSIS — R2681 Unsteadiness on feet: Secondary | ICD-10-CM

## 2019-09-27 DIAGNOSIS — R2689 Other abnormalities of gait and mobility: Secondary | ICD-10-CM | POA: Diagnosis not present

## 2019-09-27 DIAGNOSIS — M6281 Muscle weakness (generalized): Secondary | ICD-10-CM

## 2019-09-27 NOTE — Therapy (Signed)
[place] MAIN [facility] SERVICES [address] [place], [place], [address] Phone: [phone]   Fax:  [phone]  Physical Therapy Treatment Physical Therapy Progress Note   Dates of reporting period  08/11/19  to   09/27/19  Patient Details  Name: Rachel Holmes MRN: [id number] Date of Birth: 1961-08-10 Referring Provider (PT): Eulogio Ditch    Encounter Date: 09/27/2019  PT End of Session - 09/27/19 1021    Visit Number  10    Number of Visits  16    Date for PT Re-Evaluation  10/06/19    Authorization Type  10/10 eval 08/11/19; next session 1/10 PN 11/23    PT Start Time  0930    PT Stop Time  1014    PT Time Calculation (min)  44 min    Equipment Utilized During Treatment  Gait belt;Other (comment)   L prosthesis   Activity Tolerance  Patient tolerated treatment well;Patient limited by fatigue    Behavior During Therapy  [facility] for tasks assessed/performed       Past Medical History:  Diagnosis Date  . Allergic rhinitis   . Allergy   . Anemia   . Anxiety   . Arthritis   . Back pain   . Bradycardia   . Breast mass    Patient can no longer palpate specific masses but showed tech general area of concern  . CHF (congestive heart failure) ([place])   . CKD (chronic kidney disease)    STAGE 3  . Constipation   . Diabetes mellitus without complication ([place])   . Diabetic nephropathy ([place])   . Dyspnea   . GERD (gastroesophageal reflux disease)   . Heart murmur   . HLD (hyperlipidemia)   . Hyperparathyroidism ([place])   . Hypertension   . Joint pain   . Leg edema   . Legally blind in left eye, as defined in [place]   . Lymphedema   . Obesity   . Onychomycosis     Past Surgical History:  Procedure Laterality Date  . ABDOMINAL HYSTERECTOMY    . AMPUTATION Left 05/05/2019   Procedure: AMPUTATION BELOW KNEE;  Surgeon: Katha Cabal, MD;  Location: [facility] ORS;  Service: Vascular;  Laterality: Left;  . BREAST BIOPSY Left 2014   FNA  12:00 position - Negative  . EYE SURGERY Left 2007   removed a lens, no lens implanted  . IR FLUORO GUIDE CV LINE RIGHT  12/29/2018  . IR REMOVAL TUN CV CATH W/O FL  02/19/2019  . IR US GUIDE VASC ACCESS RIGHT  12/29/2018  . TEE WITHOUT CARDIOVERSION N/A 12/28/2018   Procedure: TRANSESOPHAGEAL ECHOCARDIOGRAM (TEE);  Surgeon: Sueanne Margarita, MD;  Location: [facility] ENDOSCOPY;  Service: Cardiovascular;  Laterality: N/A;  . VAGINAL HYSTERECTOMY     abdominal hyst, not a vaginal hyst    There were no vitals filed for this visit.  Subjective Assessment - 09/27/19 0936    Subjective  Patient went to prosthetist on friday and had AFO widened. Also was given a brace for R knee but forgot to bring it today.    Pertinent History  Patient is a pleasant 58 year old female who presents for gait training/ stability for L BKA. Recently obtained her prosthesis last Friday 08/06/19 and has not worn it since the prosthetists office. PMH includes anemia, anxiety, arthritis, back pain, bradycardia, CHF, CKD stage II, DM, diabetic nephropathy, dyspnea, GERD, HLD, hyperparathyroidism, hypotension, legally blind in left eye, lymphedema, obesity,  onychomycosis. Left BKA performed 05/05/19, noted in history that patient had a fall after procedure injuring the anterior portion of the suture line. Currently using a manual chair to get around the home. At this time she can't get into bathroom due to wheelchair, is waiting for a handicap apartment.    Limitations  Lifting;Standing;Walking;House hold activities    How long can you sit comfortably?  n/a    How long can you stand comfortably?  only stood once with prosthetist    How long can you walk comfortably?  not able to walk yet.    Patient Stated Goals  to walk again with prosthesis    Currently in Pain?  Yes    Pain Score  5     Pain Location  Knee    Pain Orientation  Right    Pain Descriptors / Indicators  Aching    Pain Type  Chronic pain    Pain Onset  More than a month  ago    Pain Frequency  Constant             Vitals ; 152/68 pulse 60      Progress note Static stand 5 minutes: 1 minute 50 seconds static stand terminated due to back pain not knees. , 3 minutes of walking/dynamic standing. Ambulate 100 ft with RW: 86 ft without rest break, 170 ft with one seated rest break 5x STS 16 seconds BUE support with walker.  BLE strength L 4-/5 R 4/5 with knee 4-/5 due to pain    Treat:  4" step toe taps 30 seconds for coordination/spatial awareness.   side step in // bars with BUE support, challenged with supporting weight with RLE due to pain in R knee.    Ambulating with decreased UE support in // bars. Able to ambulate with LUE support due to R knee pain best, requires cueing for L hand, R foot L foot sequencing. 10x length of // bars. Frequent seated rest breaks due to R knee pain. Use of 5lb ankle weight on R foot for reduction of pain. CGA/ min A for weight shift and placement of LE's and UE for neutralizing body mechanics for optimal muscle recruitment and stability.   Pt educated throughout session about proper posture and technique with exercises. Improved exercise technique, movement at target joints, use of target muscles after min to mod verbal, visual, tactile cues.   Patient's condition has the potential to improve in response to therapy. Maximum improvement is yet to be obtained. The anticipated improvement is attainable and reasonable in a generally predictable time.  Patient reports she is able to walk short distances in her house now and is able to do more activities on her own now. She wants to continue progressing her walking.             PT Education - 09/27/19 1021    Education Details  exercise technique, goal progression, sock fit    Person(s) Educated  Patient    Methods  Explanation;Demonstration;Tactile cues;Verbal cues    Comprehension  Verbalized understanding;Other (comment);Returned demonstration;Verbal cues  required       PT Short Term Goals - 09/27/19 1023      PT SHORT TERM GOAL #1   Title  Patient will be independent in home exercise program to improve strength/mobility for better functional independence with ADLs.    Baseline  10/7: HEP given; 11/23: HEP compliant    Time  2    Period  Weeks  Status  Partially Met    Target Date  08/25/19        PT Long Term Goals - 09/27/19 1006      PT LONG TERM GOAL #1   Title  Patient will tolerate static standing for > 5 minutes for standing ADL performance with weight acceptance onto prosthetic limb to improve quality of life and mobility.    Baseline  10/7: 1 minute 39 seconds with heavy UE support on RW 11/23: 1 minute 50 seconds static stand terminated due to back pain not knees. , 3 minutes of walking/dynamic standing.    Time  8    Period  Weeks    Status  Partially Met    Target Date  10/06/19      PT LONG TERM GOAL #2   Title  Patient will ambulate >100 feet with RW, mod I with right prosthetic exhibiting reciprocal gait pattern with good  management of prosthetic and good safety awareness for improved home ambulation    Baseline  10/7: unable to ambulate 11/23: 86 ft with RW    Time  8    Period  Weeks    Status  Partially Met    Target Date  10/06/19      PT LONG TERM GOAL #3   Title  Patient (< 34 years old) will complete five times sit to stand test in < 10 seconds indicating an increased LE strength and improved balance.    Baseline  10/7: 21 seconds with heavy BUE support from raised surface 11/23: 16 seconds with BUE support and RW    Time  8    Period  Weeks    Status  Partially Met    Target Date  10/06/19      PT LONG TERM GOAL #4   Title  Patient will increase BLE gross strength to 4+/5 as to improve functional strength for independent gait, increased standing tolerance and increased ADL ability.    Baseline  10/7: L 3+/5 R 4-/5 11/23: L 4-/5 R 4/5 with knee 4-/5 due to pain    Time  8    Period  Weeks     Status  Partially Met    Target Date  10/06/19            Plan - 09/27/19 1022    Clinical Impression Statement  Patient is progressing towards functional goals at this time with improved ambulatory capacity and strength. Patient's dynamic mobility in weightbearing position continues to be limited by R knee pain rather than prosthetic strength. She is progressing with decreased UE assistance required for ambulation. Patient's condition has the potential to improve in response to therapy. Maximum improvement is yet to be obtained. The anticipated improvement is attainable and reasonable in a generally predictable time. Patient will benefit from skilled physical therapy for ambulation and transfers with prosthesis, strength and stability, and increase stability with functional mobility for quality of life and decreased falls risk    Personal Factors and Comorbidities  Age;Comorbidity 3+;Education;Finances;Fitness;Past/Current Experience;Social Background;Time since onset of injury/illness/exacerbation;Other    Comorbidities  anemia, anxiety, arthritis, back pain, bradycardia, CHF, CKD stage III, DM, diabetic nephropathy, dyspnea, GERD, heart murmer, HLD, hyperparathyroidism, HTN, blind in L eye, obesity, oncychomycosis    Examination-Activity Limitations  Bathing;Bed Mobility;Caring for Others;Bend;Carry;Dressing;Continence;Hygiene/Grooming;Stairs;Squat;Reach Overhead;Locomotion Level;Lift;Stand;Toileting;Transfers    Examination-Participation Restrictions  Church;Cleaning;Community Activity;Interpersonal Relationship;Driving;Laundry;Volunteer;Shop;Personal Finances;Meal Prep;Yard Work;Other    Stability/Clinical Decision Making  Evolving/Moderate complexity    Rehab Potential  Fair    PT  Frequency  2x / week    PT Duration  8 weeks    PT Treatment/Interventions  ADLs/Self Care Home Management;Aquatic Therapy;Biofeedback;Cryotherapy;Electrical Stimulation;Iontophoresis 74m/ml Dexamethasone;Moist  Heat;Traction;Ultrasound;Therapeutic activities;Functional mobility training;Stair training;Gait training;DME Instruction;Therapeutic exercise;Balance training;Neuromuscular re-education;Patient/family education;Manual techniques;Wheelchair mTax adviserCompression bandaging;Scar mobilization;Passive range of motion;Dry needling;Energy conservation;Splinting;Taping    PT Next Visit Plan  ambulate with RW    PT Home Exercise Plan  prone 10 minutes a day, wear prosthesis 1 hour a day, side lie posterior kick backs 10x    Consulted and Agree with Plan of Care  Patient       Patient will benefit from skilled therapeutic intervention in order to improve the following deficits and impairments:  Abnormal gait, Cardiopulmonary status limiting activity, Decreased activity tolerance, Decreased balance, Decreased knowledge of precautions, Decreased endurance, Decreased coordination, Decreased knowledge of use of DME, Decreased mobility, Decreased range of motion, Decreased safety awareness, Difficulty walking, Decreased strength, Hypomobility, Impaired flexibility, Impaired perceived functional ability, Impaired sensation, Obesity, Prosthetic Dependency, Postural dysfunction, Improper body mechanics, Pain  Visit Diagnosis: Other abnormalities of gait and mobility  Muscle weakness (generalized)  Unsteadiness on feet     Problem List Patient Active Problem List   Diagnosis Date Noted  . Chronic pain syndrome 07/13/2019  . Wound dehiscence 05/29/2019  . Hx of BKA, left ([place] 05/24/2019  . PVD (peripheral vascular disease) ([place] 05/13/2019  . Chronic osteomyelitis of ankle and foot, left ([place] 05/05/2019  . Chronic diastolic CHF (congestive heart failure) ([place] 04/19/2019  . Leg edema 04/19/2019  . Acute bacterial endocarditis 04/19/2019  . Chest pain of uncertain etiology 080/88/1103 . Ulcer of left ankle ([place] 04/14/2019  . Ankle deformity, left 01/21/2019  . Diabetic  polyneuropathy associated with type 2 diabetes mellitus ([place] 01/21/2019  . Aortic valve endocarditis   . Bacteremia due to Streptococcus 12/24/2018  . Diabetic ulcer of left ankle associated with diabetes mellitus due to underlying condition, with fat layer exposed (H[place] 11/09/2018  . Malignant hypertension (arteriolar nephrosclerosis), stage 1-4 or unspecified chronic kidney disease 07/15/2018  . Stage 4 chronic kidney disease ([place] 07/15/2018  . Morbid obesity with BMI of 45.0-49.9, adult ([place] 07/15/2018  . Excessive daytime sleepiness 07/15/2018  . Other fatigue 03/11/2018  . Essential hypertension 03/11/2018  . Vitamin D deficiency 03/11/2018  . Congestive heart failure ([place] 03/11/2018  . Primary osteoarthritis of right knee 01/06/2018  . Uncontrolled type 2 diabetes mellitus with polyneuropathy ([place] 10/03/2017  . Chronic pain of right knee 10/02/2017  . Obesity 10/02/2017  . Charcot's joint of left foot 08/29/2017  . Lymphedema 09/06/2016  . Hyponatremia with extracellular fluid depletion 02/02/2016  . Nausea with vomiting, unspecified 02/02/2016  . Closed nondisplaced fracture of right patella 08/23/2015  . Acute cystitis without hematuria 08/14/2014  . Colitis 08/14/2014  . HTN (hypertension), malignant 08/14/2014  . Acute renal failure superimposed on stage 3 chronic kidney disease ([place]Le[place] 06/26/2014  . Hyperparathyroidism, unspecified ([place] 02/24/2014  . Allergic rhinitis 02/22/2014  . Onychomycosis 02/22/2014  . Anxiety 11/05/2013  . Bradycardia 05/14/2013  . Gastro-esophageal reflux disease without esophagitis 04/05/2013  . Anemia in other chronic diseases classified elsewhere 01/27/2013   MJanna Arch PT, DPT   09/27/2019, 10:23 AM  [place] HeightsMAIN [facility]arc[facility] HospitalSERVICES [address] [address]Phone: [phone]  Fax:  [phone] Name: Rachel MCGOUGHMRN: [id number] of Birth: [birth year]

## 2019-09-29 ENCOUNTER — Other Ambulatory Visit: Payer: Self-pay

## 2019-09-29 ENCOUNTER — Ambulatory Visit
Payer: Medicare HMO | Attending: Student in an Organized Health Care Education/Training Program | Admitting: Student in an Organized Health Care Education/Training Program

## 2019-09-29 ENCOUNTER — Ambulatory Visit: Payer: Medicare HMO

## 2019-09-29 ENCOUNTER — Encounter: Payer: Self-pay | Admitting: Student in an Organized Health Care Education/Training Program

## 2019-09-29 DIAGNOSIS — M1711 Unilateral primary osteoarthritis, right knee: Secondary | ICD-10-CM | POA: Diagnosis not present

## 2019-09-29 DIAGNOSIS — M6281 Muscle weakness (generalized): Secondary | ICD-10-CM

## 2019-09-29 DIAGNOSIS — R2689 Other abnormalities of gait and mobility: Secondary | ICD-10-CM | POA: Diagnosis not present

## 2019-09-29 DIAGNOSIS — R2681 Unsteadiness on feet: Secondary | ICD-10-CM

## 2019-09-29 MED ORDER — LIDOCAINE HCL 2 % IJ SOLN
5.0000 mL | Freq: Once | INTRAMUSCULAR | Status: AC
  Administered 2019-09-29: 40 mg

## 2019-09-29 MED ORDER — METHYLPREDNISOLONE ACETATE 40 MG/ML IJ SUSP
40.0000 mg | Freq: Once | INTRAMUSCULAR | Status: AC
  Administered 2019-09-29: 40 mg via INTRA_ARTICULAR

## 2019-09-29 MED ORDER — METHYLPREDNISOLONE ACETATE 40 MG/ML IJ SUSP
INTRAMUSCULAR | Status: AC
  Filled 2019-09-29: qty 2

## 2019-09-29 MED ORDER — LIDOCAINE HCL 2 % IJ SOLN
INTRAMUSCULAR | Status: AC
  Filled 2019-09-29: qty 20

## 2019-09-29 MED ORDER — LIDOCAINE HCL (PF) 1 % IJ SOLN: 5.0000 mL | Freq: Once | INTRAMUSCULAR | Status: DC

## 2019-09-29 MED ORDER — SODIUM HYALURONATE (VISCOSUP) 20 MG/2ML IX SOSY: 2.0000 mL | PREFILLED_SYRINGE | Freq: Once | INTRA_ARTICULAR | Status: DC

## 2019-09-29 NOTE — Patient Instructions (Signed)

## 2019-09-29 NOTE — Progress Notes (Signed)
Safety precautions to be maintained throughout the outpatient stay will include: orient to surroundings, keep bed in low position, maintain call bell within reach at all times, provide assistance with transfer out of bed and ambulation.  

## 2019-09-29 NOTE — Progress Notes (Signed)
Patient's Name: Rachel Holmes  MRN: [id number]  Referring Provider: Gillis Santa, MD  DOB: 1960-12-03  PCP: Sharyne Peach, MD  DOS: 09/29/2019  Note by: Gillis Santa, MD  Service setting: Ambulatory outpatient  Specialty: Interventional Pain Management  Patient type: Established  Location: [facility] (AMB) Pain Management Facility  Visit type: Interventional Procedure   Primary Reason for Visit: Interventional Pain Management Treatment. CC: Knee Pain (right)  Procedure:          Anesthesia, Analgesia, Anxiolysis:  Type: Diagnostic Intra-Articular Local anesthetic and steroid Knee Injection #1  Region: Suprapatellar Knee Region Level: Knee Joint Laterality: Right knee  Type: Local Anesthesia Indication(s): Analgesia         Local Anesthetic: Lidocaine 1-2% Route: Infiltration (/IM) IV Access: Declined Sedation: Declined   Position: Sitting   Indications: 1. Primary osteoarthritis of right knee    Pain Score: Pre-procedure: 4 /10 Post-procedure: 4 /10   Patient is status post 2 intra-articular knee Hyalgan injections which she states have not been effective for her right knee pain.  Rather than repeating 1/3 injection, we discussed a right intra-articular knee steroid injection.  Patient also has a superior medial knee effusion which she would like for me to drain.  We will utilize a superomedial suprapatellar approach to help drain her joint effusion and also to deliver intra-articular steroid injection.  Pre-op Assessment:  Rachel Holmes is a 59 y.o. (year old), female patient, seen today for interventional treatment. She  has a past surgical history that includes Abdominal hysterectomy; TEE without cardioversion (N/A, 12/28/2018); IR Fluoro Guide CV Line Right (12/29/2018); IR US Guide Vasc Access Right (12/29/2018); IR Removal Tun Cv Cath W/O FL (02/19/2019); Vaginal hysterectomy; Breast biopsy (Left, 2014); Eye surgery (Left, 2007); and Amputation (Left, 05/05/2019). Rachel Holmes has a  current medication list which includes the following prescription(s): albuterol, amlodipine, aspirin, one touch ultra 2, clobetasol cream, clonidine, freestyle libre 14 day reader, freestyle libre 14 day sensor, docusate sodium, trulicity, duloxetine, ensure max protein, fluticasone, gabapentin, glucosamine-chondroitin, glucose blood, novolog flexpen, insulin pen needle, isosorbide mononitrate, onetouch ultrasoft, multivitamin with minerals, olmesartan, omeprazole, ondansetron, sodium bicarbonate, torsemide, tramadol, vitamin b-12, and ascorbic acid, and the following Facility-Administered Medications: lidocaine (pf) and lidocaine. Her primarily concern today is the Knee Pain (right)  Initial Vital Signs:  Pulse/HCG Rate: 71  Temp: 98 F (36.7 C) Resp: 18 BP: (!) 151/71 SpO2: 98 %  BMI: Estimated body mass index is 44.85 kg/m as calculated from the following:   Height as of this encounter: 5\' 8"  (1.727 m).   Weight as of this encounter: 295 lb (133.8 kg).  Risk Assessment: Allergies: Reviewed. She is allergic to statins; penicillins; codeine; ibuprofen; percocet [oxycodone-acetaminophen]; tramadol; and vicodin [hydrocodone-acetaminophen].  Allergy Precautions: None required Coagulopathies: Reviewed. None identified.  Blood-thinner therapy: None at this time Active Infection(s): Reviewed. None identified. Rachel Holmes is afebrile  Site Confirmation: Rachel Holmes was asked to confirm the procedure and laterality before marking the site Procedure checklist: Completed Consent: Before the procedure and under the influence of no sedative(s), amnesic(s), or anxiolytics, the patient was informed of the treatment options, risks and possible complications. To fulfill our ethical and legal obligations, as recommended by the American Medical Association's Code of Ethics, I have informed the patient of my clinical impression; the nature and purpose of the treatment or procedure; the risks, benefits, and  possible complications of the intervention; the alternatives, including doing nothing; the risk(s) and benefit(s) of the alternative treatment(s) or procedure(s); and  the risk(s) and benefit(s) of doing nothing. The patient was provided information about the general risks and possible complications associated with the procedure. These may include, but are not limited to: failure to achieve desired goals, infection, bleeding, organ or nerve damage, allergic reactions, paralysis, and death. In addition, the patient was informed of those risks and complications associated to the procedure, such as failure to decrease pain; infection; bleeding; organ or nerve damage with subsequent damage to sensory, motor, and/or autonomic systems, resulting in permanent pain, numbness, and/or weakness of one or several areas of the body; allergic reactions; (i.e.: anaphylactic reaction); and/or death. Furthermore, the patient was informed of those risks and complications associated with the medications. These include, but are not limited to: allergic reactions (i.e.: anaphylactic or anaphylactoid reaction(s)); adrenal axis suppression; blood sugar elevation that in diabetics may result in ketoacidosis or comma; water retention that in patients with history of congestive heart failure may result in shortness of breath, pulmonary edema, and decompensation with resultant heart failure; weight gain; swelling or edema; medication-induced neural toxicity; particulate matter embolism and blood vessel occlusion with resultant organ, and/or nervous system infarction; and/or aseptic necrosis of one or more joints. Finally, the patient was informed that Medicine is not an exact science; therefore, there is also the possibility of unforeseen or unpredictable risks and/or possible complications that may result in a catastrophic outcome. The patient indicated having understood very clearly. We have given the patient no guarantees and we have  made no promises. Enough time was given to the patient to ask questions, all of which were answered to the patient's satisfaction. Rachel Holmes has indicated that she wanted to continue with the procedure. Attestation: I, the ordering provider, attest that I have discussed with the patient the benefits, risks, side-effects, alternatives, likelihood of achieving goals, and potential problems during recovery for the procedure that I have provided informed consent. Date  Time: 09/29/2019 10:29 AM  Pre-Procedure Preparation:  Monitoring: As per clinic protocol. Respiration, ETCO2, SpO2, BP, heart rate and rhythm monitor placed and checked for adequate function Safety Precautions: Patient was assessed for positional comfort and pressure points before starting the procedure. Time-out: I initiated and conducted the "Time-out" before starting the procedure, as per protocol. The patient was asked to participate by confirming the accuracy of the "Time Out" information. Verification of the correct person, site, and procedure were performed and confirmed by me, the nursing staff, and the patient. "Time-out" conducted as per Joint Commission's Universal Protocol (UP.01.01.01). Time:    Description of Procedure:          Target Area: Knee Joint Approach: Just above the Lateral tibial plateau, lateral to the infrapatellar tendon. Area Prepped: Entire knee area, from the mid-thigh to the mid-shin. Prepping solution: DuraPrep (Iodine Povacrylex [0.7% available iodine] and Isopropyl Alcohol, 74% w/w) Safety Precautions: Aspiration looking for blood return was conducted prior to all injections. At no point did we inject any substances, as a needle was being advanced. No attempts were made at seeking any paresthesias. Safe injection practices and needle disposal techniques used. Medications properly checked for expiration dates. SDV (single dose vial) medications used. Description of the Procedure: Protocol guidelines  were followed. The patient was placed in position over the fluoroscopy table. The target area was identified and the area prepped in the usual manner. Skin & deeper tissues infiltrated with local anesthetic. Appropriate amount of time allowed to pass for local anesthetics to take effect. The procedure needles were then advanced to the target  area. Proper needle placement secured. Negative aspiration confirmed. Solution injected in intermittent fashion, asking for systemic symptoms every 0.5cc of injectate. The needles were then removed and the area cleansed, making sure to leave some of the prepping solution back to take advantage of its long term bactericidal properties. Vitals:   09/29/19 1037 09/29/19 1038  BP:  (!) 151/71  Pulse:  71  Resp:  18  Temp:  98 F (36.7 C)  TempSrc:  Temporal  SpO2:  98%  Weight: 295 lb (133.8 kg)   Height: 5\' 8"  (1.727 m)     Start Time:   hrs. End Time:   hrs. Materials:  Needle(s) Type: Regular needle Gauge: 25G Length: 1.5-in Medication(s): Please see orders for medications and dosing details. Approximately 5 cc of yellowish fluid removed from joint space THEN  4 cc solution consisting of 2.5 cc of 2% lidocaine and 1.5 cc of methylprednisolone (40 mg/cc) injected without any issues  Imaging Guidance:          Type of Imaging Technique: None used Indication(s): N/A Exposure Time: No patient exposure Contrast: None used. Fluoroscopic Guidance: N/A Ultrasound Guidance: N/A Interpretation: N/A  Antibiotic Prophylaxis:   Anti-infectives (From admission, onward)   None     Indication(s): None identified  Post-operative Assessment:  Post-procedure Vital Signs:  Pulse/HCG Rate: 71  Temp: 98 F (36.7 C) Resp: 18 BP: (!) 151/71 SpO2: 98 %  EBL: None  Complications: No immediate post-treatment complications observed by team, or reported by patient.  Note: The patient tolerated the entire procedure well. A repeat set of vitals were taken  after the procedure and the patient was kept under observation following institutional policy, for this type of procedure. Post-procedural neurological assessment was performed, showing return to baseline, prior to discharge. The patient was provided with post-procedure discharge instructions, including a section on how to identify potential problems. Should any problems arise concerning this procedure, the patient was given instructions to immediately contact us, at any time, without hesitation. In any case, we plan to contact the patient by telephone for a follow-up status report regarding this interventional procedure.  Comments:  No additional relevant information.  Plan of Care  Orders:  Orders Placed This Encounter  Procedures  . Steroid Knee Blk (Today)    Local Anesthetic & Steroid injection.    Scheduling Instructions:     Side(s): RIGHT     Sedation: None     Timeframe: Today    Order Specific Question:   Where will this procedure be performed?    Answer:   [facility]    Medications ordered for procedure: Meds ordered this encounter  Medications  . lidocaine (PF) (XYLOCAINE) 1 % injection 5 mL  . DISCONTD: Sodium Hyaluronate SOSY 2 mL  . lidocaine (XYLOCAINE) 2 % (with pres) injection 100 mg  . methylPREDNISolone acetate (DEPO-MEDROL) injection 40 mg  . methylPREDNISolone acetate (DEPO-MEDROL) injection 40 mg   Medications administered: We administered methylPREDNISolone acetate and methylPREDNISolone acetate.  See the medical record for exact dosing, route, and time of administration.  Follow-up plan:   Return in about 3 weeks (around 10/20/2019) for Post Procedure Evaluation, virtual.     Recent Visits Date Type Provider Dept  08/30/19 Procedure visit Gillis Santa, MD [facility]  08/02/19 Procedure visit Gillis Santa, MD [facility]  07/13/19 Office Visit Gillis Santa, MD [facility]  Showing recent visits within past 90  days and meeting all other requirements   Today's  Visits Date Type Provider Dept  09/29/19 Procedure visit Gillis Santa, MD [facility]  Showing today's visits and meeting all other requirements   Future Appointments No visits were found meeting these conditions.  Showing future appointments within next 90 days and meeting all other requirements   Disposition: Discharge home  Discharge Date & Time: 09/29/2019; 1118 hrs.   Primary Care Physician: Sharyne Peach, MD Location: [facility] Note by: Gillis Santa, MD Date: 09/29/2019; Time: 11:20 AM  Disclaimer:  Medicine is not an exact science. The only guarantee in medicine is that nothing is guaranteed. It is important to note that the decision to proceed with this intervention was based on the information collected from the patient. The Data and conclusions were drawn from the patient's questionnaire, the interview, and the physical examination. Because the information was provided in large part by the patient, it cannot be guaranteed that it has not been purposely or unconsciously manipulated. Every effort has been made to obtain as much relevant data as possible for this evaluation. It is important to note that the conclusions that lead to this procedure are derived in large part from the available data. Always take into account that the treatment will also be dependent on availability of resources and existing treatment guidelines, considered by other Pain Management Practitioners as being common knowledge and practice, at the time of the intervention. For Medico-Legal purposes, it is also important to point out that variation in procedural techniques and pharmacological choices are the acceptable norm. The indications, contraindications, technique, and results of the above procedure should only be interpreted and judged by a Board-Certified Interventional Pain Specialist with extensive familiarity and  expertise in the same exact procedure and technique.

## 2019-09-29 NOTE — Therapy (Signed)
[place] MAIN [facility] SERVICES [address] [place], [place], [address] Phone: [phone]   Fax:  [phone]  Physical Therapy Treatment  Patient Details  Name: Rachel Holmes MRN: [id number] Date of Birth: 1960/12/29 Referring Provider (Rachel Holmes): Eulogio Ditch    Encounter Date: 09/29/2019  Rachel Holmes End of Session - 09/29/19 0945    Visit Number  11    Number of Visits  16    Date for Rachel Holmes Re-Evaluation  10/06/19    Authorization Type  1/10 PN 11/23    Rachel Holmes Start Time  0930    Rachel Holmes Stop Time  1014    Rachel Holmes Time Calculation (min)  44 min    Equipment Utilized During Treatment  Gait belt;Other (comment)   L prosthesis   Activity Tolerance  Patient tolerated treatment well;Patient limited by fatigue    Behavior During Therapy  [facility] for tasks assessed/performed       Past Medical History:  Diagnosis Date  . Allergic rhinitis   . Allergy   . Anemia   . Anxiety   . Arthritis   . Back pain   . Bradycardia   . Breast mass    Patient can no longer palpate specific masses but showed tech general area of concern  . CHF (congestive heart failure) ([place])   . CKD (chronic kidney disease)    STAGE 3  . Constipation   . Diabetes mellitus without complication ([place])   . Diabetic nephropathy ([place])   . Dyspnea   . GERD (gastroesophageal reflux disease)   . Heart murmur   . HLD (hyperlipidemia)   . Hyperparathyroidism ([place])   . Hypertension   . Joint pain   . Leg edema   . Legally blind in left eye, as defined in [place]   . Lymphedema   . Obesity   . Onychomycosis     Past Surgical History:  Procedure Laterality Date  . ABDOMINAL HYSTERECTOMY    . AMPUTATION Left 05/05/2019   Procedure: AMPUTATION BELOW KNEE;  Surgeon: Katha Cabal, MD;  Location: [facility] ORS;  Service: Vascular;  Laterality: Left;  . BREAST BIOPSY Left 2014   FNA 12:00 position - Negative  . EYE SURGERY Left 2007   removed a lens, no lens implanted  . IR FLUORO GUIDE CV LINE RIGHT   12/29/2018  . IR REMOVAL TUN CV CATH W/O FL  02/19/2019  . IR US GUIDE VASC ACCESS RIGHT  12/29/2018  . TEE WITHOUT CARDIOVERSION N/A 12/28/2018   Procedure: TRANSESOPHAGEAL ECHOCARDIOGRAM (TEE);  Surgeon: Sueanne Margarita, MD;  Location: [facility] ENDOSCOPY;  Service: Cardiovascular;  Laterality: N/A;  . VAGINAL HYSTERECTOMY     abdominal hyst, not a vaginal hyst    There were no vitals filed for this visit.  Subjective Assessment - 09/29/19 0943    Subjective  Patient presents wearing slip on shoes. Is going to the pain doctor today after appointment for her R knee.    Pertinent History  Patient is a pleasant 58 year old female who presents for gait training/ stability for L BKA. Recently obtained her prosthesis last Friday 08/06/19 and has not worn it since the prosthetists office. PMH includes anemia, anxiety, arthritis, back pain, bradycardia, CHF, CKD stage II, DM, diabetic nephropathy, dyspnea, GERD, HLD, hyperparathyroidism, hypotension, legally blind in left eye, lymphedema, obesity, onychomycosis. Left BKA performed 05/05/19, noted in history that patient had a fall after procedure injuring the anterior portion of the suture line. Currently using a manual  chair to get around the home. At this time she can't get into bathroom due to wheelchair, is waiting for a handicap apartment.    Limitations  Lifting;Standing;Walking;House hold activities    How long can you sit comfortably?  n/a    How long can you stand comfortably?  only stood once with prosthetist    How long can you walk comfortably?  not able to walk yet.    Patient Stated Goals  to walk again with prosthesis    Currently in Pain?  Yes    Pain Score  4     Pain Location  Knee    Pain Orientation  Right    Pain Descriptors / Indicators  Aching    Pain Type  Chronic pain    Pain Onset  More than a month ago    Pain Frequency  Constant          Treat:  Patient unable to walk due to pain in R knee incrasing to 8/10 and wearing slip  on shoes that she cannot walk in, attempted and deemed unsafe  Supine:  SLR 10x each LE, challenging for last 2, opp LE in hooklying,  Straight leg abduction 15x each LE; 2 sets, very challenging for patient Heel slides 15x each LE one leg at time, terminated on RLE due to increased pain, 2 sets on LLE.   Prone: : without prosthetic limb  -hamstring curls 15x each LE -hip extensions 8x each LE, very challenging for gluteal activation -hip flexor stretch 60 second holds x 2 -education on need to perform prone for hip flexor stretch  Seated:  5lb ankle weights: -LAQ 20x each LE; cueing for decreased velocity ; 2 sets   -arms crossed marching 10x each LE -side step out and in 15x each LE   TrA activation 10x 3 second holds  Rachel Holmes educated throughout session about proper posture and technique with exercises. Improved exercise technique, movement at target joints, use of target muscles after min to mod verbal, visual, tactile cues.    Patient's session limited by R knee pain and improper shoe wear. Supine interventions performed due to weightbearing intolerance this session. Patient fatigued quickly with prolonged strengthening interventions. Patient will benefit from skilled physical therapy for ambulation and transfers with prosthesis, strength and stability, and increase stability with functional mobility for quality of life and decreased falls risk                   Rachel Holmes Education - 09/29/19 0944    Education Details  proper shoe wear, exercise technique,    Person(s) Educated  Patient    Methods  Explanation;Demonstration;Tactile cues;Verbal cues    Comprehension  Verbalized understanding;Returned demonstration;Verbal cues required;Tactile cues required;Need further instruction       Rachel Holmes Short Term Goals - 09/27/19 1023      Rachel Holmes SHORT TERM GOAL #1   Title  Patient will be independent in home exercise program to improve strength/mobility for better functional  independence with ADLs.    Baseline  10/7: HEP given; 11/23: HEP compliant    Time  2    Period  Weeks    Status  Partially Met    Target Date  08/25/19        Rachel Holmes Long Term Goals - 09/27/19 1006      Rachel Holmes LONG TERM GOAL #1   Title  Patient will tolerate static standing for > 5 minutes for standing ADL performance with weight acceptance onto prosthetic  limb to improve quality of life and mobility.    Baseline  10/7: 1 minute 39 seconds with heavy UE support on RW 11/23: 1 minute 50 seconds static stand terminated due to back pain not knees. , 3 minutes of walking/dynamic standing.    Time  8    Period  Weeks    Status  Partially Met    Target Date  10/06/19      Rachel Holmes LONG TERM GOAL #2   Title  Patient will ambulate >100 feet with RW, mod I with right prosthetic exhibiting reciprocal gait pattern with good  management of prosthetic and good safety awareness for improved home ambulation    Baseline  10/7: unable to ambulate 11/23: 86 ft with RW    Time  8    Period  Weeks    Status  Partially Met    Target Date  10/06/19      Rachel Holmes LONG TERM GOAL #3   Title  Patient (< 93 years old) will complete five times sit to stand test in < 10 seconds indicating an increased LE strength and improved balance.    Baseline  10/7: 21 seconds with heavy BUE support from raised surface 11/23: 16 seconds with BUE support and RW    Time  8    Period  Weeks    Status  Partially Met    Target Date  10/06/19      Rachel Holmes LONG TERM GOAL #4   Title  Patient will increase BLE gross strength to 4+/5 as to improve functional strength for independent gait, increased standing tolerance and increased ADL ability.    Baseline  10/7: L 3+/5 R 4-/5 11/23: L 4-/5 R 4/5 with knee 4-/5 due to pain    Time  8    Period  Weeks    Status  Partially Met    Target Date  10/06/19            Plan - 09/29/19 0950    Clinical Impression Statement  Patient's session limited by R knee pain and improper shoe wear. Supine  interventions performed due to weightbearing intolerance this session. Patient fatigued quickly with prolonged strengthening interventions. Patient will benefit from skilled physical therapy for ambulation and transfers with prosthesis, strength and stability, and increase stability with functional mobility for quality of life and decreased falls risk    Personal Factors and Comorbidities  Age;Comorbidity 3+;Education;Finances;Fitness;Past/Current Experience;Social Background;Time since onset of injury/illness/exacerbation;Other    Comorbidities  anemia, anxiety, arthritis, back pain, bradycardia, CHF, CKD stage III, DM, diabetic nephropathy, dyspnea, GERD, heart murmer, HLD, hyperparathyroidism, HTN, blind in L eye, obesity, oncychomycosis    Examination-Activity Limitations  Bathing;Bed Mobility;Caring for Others;Bend;Carry;Dressing;Continence;Hygiene/Grooming;Stairs;Squat;Reach Overhead;Locomotion Level;Lift;Stand;Toileting;Transfers    Examination-Participation Restrictions  Church;Cleaning;Community Activity;Interpersonal Relationship;Driving;Laundry;Volunteer;Shop;Personal Finances;Meal Prep;Yard Work;Other    Stability/Clinical Decision Making  Evolving/Moderate complexity    Rehab Potential  Fair    Rachel Holmes Frequency  2x / week    Rachel Holmes Duration  8 weeks    Rachel Holmes Treatment/Interventions  ADLs/Self Care Home Management;Aquatic Therapy;Biofeedback;Cryotherapy;Electrical Stimulation;Iontophoresis 8m/ml Dexamethasone;Moist Heat;Traction;Ultrasound;Therapeutic activities;Functional mobility training;Stair training;Gait training;DME Instruction;Therapeutic exercise;Balance training;Neuromuscular re-education;Patient/family education;Manual techniques;Wheelchair mTax adviserCompression bandaging;Scar mobilization;Passive range of motion;Dry needling;Energy conservation;Splinting;Taping    Rachel Holmes Next Visit Plan  ambulate with RW    Rachel Holmes Home Exercise Plan  prone 10 minutes a day, wear  prosthesis 1 hour a day, side lie posterior kick backs 10x    Consulted and Agree with Plan of Care  Patient  Patient will benefit from skilled therapeutic intervention in order to improve the following deficits and impairments:  Abnormal gait, Cardiopulmonary status limiting activity, Decreased activity tolerance, Decreased balance, Decreased knowledge of precautions, Decreased endurance, Decreased coordination, Decreased knowledge of use of DME, Decreased mobility, Decreased range of motion, Decreased safety awareness, Difficulty walking, Decreased strength, Hypomobility, Impaired flexibility, Impaired perceived functional ability, Impaired sensation, Obesity, Prosthetic Dependency, Postural dysfunction, Improper body mechanics, Pain  Visit Diagnosis: Other abnormalities of gait and mobility  Muscle weakness (generalized)  Unsteadiness on feet     Problem List Patient Active Problem List   Diagnosis Date Noted  . Chronic pain syndrome 07/13/2019  . Wound dehiscence 05/29/2019  . Hx of BKA, left ([place]) 05/24/2019  . PVD (peripheral vascular disease) ([place]) 05/13/2019  . Chronic osteomyelitis of ankle and foot, left ([place]) 05/05/2019  . Chronic diastolic CHF (congestive heart failure) ([place]) 04/19/2019  . Leg edema 04/19/2019  . Acute bacterial endocarditis 04/19/2019  . Chest pain of uncertain etiology 60/60/0459  . Ulcer of left ankle ([place]) 04/14/2019  . Ankle deformity, left 01/21/2019  . Diabetic polyneuropathy associated with type 2 diabetes mellitus ([place]) 01/21/2019  . Aortic valve endocarditis   . Bacteremia due to Streptococcus 12/24/2018  . Diabetic ulcer of left ankle associated with diabetes mellitus due to underlying condition, with fat layer exposed ([place]) 11/09/2018  . Malignant hypertension (arteriolar nephrosclerosis), stage 1-4 or unspecified chronic kidney disease 07/15/2018  . Stage 4 chronic kidney disease ([place]) 07/15/2018  . Morbid obesity with BMI of  45.0-49.9, adult ([place]) 07/15/2018  . Excessive daytime sleepiness 07/15/2018  . Other fatigue 03/11/2018  . Essential hypertension 03/11/2018  . Vitamin D deficiency 03/11/2018  . Congestive heart failure ([place]) 03/11/2018  . Primary osteoarthritis of right knee 01/06/2018  . Uncontrolled type 2 diabetes mellitus with polyneuropathy ([place]) 10/03/2017  . Chronic pain of right knee 10/02/2017  . Obesity 10/02/2017  . Charcot's joint of left foot 08/29/2017  . Lymphedema 09/06/2016  . Hyponatremia with extracellular fluid depletion 02/02/2016  . Nausea with vomiting, unspecified 02/02/2016  . Closed nondisplaced fracture of right patella 08/23/2015  . Acute cystitis without hematuria 08/14/2014  . Colitis 08/14/2014  . HTN (hypertension), malignant 08/14/2014  . Acute renal failure superimposed on stage 3 chronic kidney disease ([place]) 06/26/2014  . Hyperparathyroidism, unspecified ([place]) 02/24/2014  . Allergic rhinitis 02/22/2014  . Onychomycosis 02/22/2014  . Anxiety 11/05/2013  . Bradycardia 05/14/2013  . Gastro-esophageal reflux disease without esophagitis 04/05/2013  . Anemia in other chronic diseases classified elsewhere 01/27/2013   Rachel Holmes, Rachel Holmes, Rachel Holmes   09/29/2019, 10:19 AM  [place] MAIN [facility] SERVICES [address] [place], [place], [address] Phone: [phone]   Fax:  [phone]  Name: Rachel Holmes MRN: [id number] Date of Birth: 1961-05-03

## 2019-10-04 ENCOUNTER — Ambulatory Visit: Payer: Medicare HMO

## 2019-10-04 ENCOUNTER — Other Ambulatory Visit: Payer: Self-pay

## 2019-10-04 DIAGNOSIS — R2689 Other abnormalities of gait and mobility: Secondary | ICD-10-CM | POA: Diagnosis not present

## 2019-10-04 DIAGNOSIS — R2681 Unsteadiness on feet: Secondary | ICD-10-CM

## 2019-10-04 DIAGNOSIS — M6281 Muscle weakness (generalized): Secondary | ICD-10-CM

## 2019-10-04 NOTE — Therapy (Signed)
[place] MAIN [facility] SERVICES [address] [place], [place], [address] Phone: [phone]   Fax:  [phone]  Physical Therapy Treatment/RECERT  Patient Details  Name: Rachel Holmes MRN: [id number] Date of Birth: 11/29/1960 Referring Provider (PT): Eulogio Ditch    Encounter Date: 10/04/2019  PT End of Session - 10/04/19 1000    Visit Number  12    Number of Visits  28    Date for PT Re-Evaluation  11/29/19    Authorization Type  2/10 PN 11/23    PT Start Time  0930    PT Stop Time  6834    PT Time Calculation (min)  44 min    Equipment Utilized During Treatment  Gait belt;Other (comment)   L prosthesis   Activity Tolerance  Patient tolerated treatment well;Patient limited by fatigue    Behavior During Therapy  [facility] for tasks assessed/performed       Past Medical History:  Diagnosis Date  . Allergic rhinitis   . Allergy   . Anemia   . Anxiety   . Arthritis   . Back pain   . Bradycardia   . Breast mass    Patient can no longer palpate specific masses but showed tech general area of concern  . CHF (congestive heart failure) ([place])   . CKD (chronic kidney disease)    STAGE 3  . Constipation   . Diabetes mellitus without complication ([place])   . Diabetic nephropathy ([place])   . Dyspnea   . GERD (gastroesophageal reflux disease)   . Heart murmur   . HLD (hyperlipidemia)   . Hyperparathyroidism ([place])   . Hypertension   . Joint pain   . Leg edema   . Legally blind in left eye, as defined in [place]   . Lymphedema   . Obesity   . Onychomycosis     Past Surgical History:  Procedure Laterality Date  . ABDOMINAL HYSTERECTOMY    . AMPUTATION Left 05/05/2019   Procedure: AMPUTATION BELOW KNEE;  Surgeon: Katha Cabal, MD;  Location: [facility] ORS;  Service: Vascular;  Laterality: Left;  . BREAST BIOPSY Left 2014   FNA 12:00 position - Negative  . EYE SURGERY Left 2007   removed a lens, no lens implanted  . IR FLUORO GUIDE CV LINE  RIGHT  12/29/2018  . IR REMOVAL TUN CV CATH W/O FL  02/19/2019  . IR US GUIDE VASC ACCESS RIGHT  12/29/2018  . TEE WITHOUT CARDIOVERSION N/A 12/28/2018   Procedure: TRANSESOPHAGEAL ECHOCARDIOGRAM (TEE);  Surgeon: Sueanne Margarita, MD;  Location: [facility] ENDOSCOPY;  Service: Cardiovascular;  Laterality: N/A;  . VAGINAL HYSTERECTOMY     abdominal hyst, not a vaginal hyst    There were no vitals filed for this visit.  Subjective Assessment - 10/04/19 0958    Subjective  Patient reports no falls or LOB since last session. Went to pain clinic and received steroid injection into R knee. Has been walking at home. Will have a phone visist in 2 weeks about nerve block.    Pertinent History  Patient is a pleasant 58 year old female who presents for gait training/ stability for L BKA. Recently obtained her prosthesis last Friday 08/06/19 and has not worn it since the prosthetists office. PMH includes anemia, anxiety, arthritis, back pain, bradycardia, CHF, CKD stage II, DM, diabetic nephropathy, dyspnea, GERD, HLD, hyperparathyroidism, hypotension, legally blind in left eye, lymphedema, obesity, onychomycosis. Left BKA performed 05/05/19, noted in history that patient  had a fall after procedure injuring the anterior portion of the suture line. Currently using a manual chair to get around the home. At this time she can't get into bathroom due to wheelchair, is waiting for a handicap apartment.    Limitations  Lifting;Standing;Walking;House hold activities    How long can you sit comfortably?  n/a    How long can you stand comfortably?  only stood once with prosthetist    How long can you walk comfortably?  not able to walk yet.    Patient Stated Goals  to walk again with prosthesis    Currently in Pain?  Yes    Pain Score  3     Pain Location  Knee    Pain Orientation  Right    Pain Descriptors / Indicators  Aching    Pain Type  Chronic pain    Pain Onset  More than a month ago    Pain Frequency  Intermittent          Vitals; 163/71 pulse 65            Ambulating with decreased UE support in // bars. Able to ambulate with LUE support due to R knee pain best, requires cueing for L hand, R foot L foot sequencing.10x length of // bars. Frequent seated rest breaks due to R knee pain. Use of 5lb ankle weight on R foot for reduction of pain. CGA/ min A for weight shift and placement of LE's and UE for neutralizing body mechanics for optimal muscle recruitment and stability.  ambulate with quad cane in LUE with decreasing support from // bars with RUE; decreased to no UE support. x18 lengths of // bars; max cueing for body mechanics, sequencing with AD for stability, and safety   Quad cane: standing marches in // bars: 10x each LE (added to HEP)  Quad cane: horizontal head turns with static standing 60 seconds (added to HEP)   side step 2x length of // bars with BUE support  Standing hip extension 12x each LE BUE support ; very challenging for stabilizing on RLE due to knee discomfort.   Backwards ambulation 2x length of // bars. Slight discomfort in R knee.   4" step toe taps 10x each LE, BUE support    Pt educated throughout session about proper posture and technique with exercises. Improved exercise technique, movement at target joints, use of target muscles after min to mod verbal, visual, tactile cues.    Goals performed 11/23, see this date for goal progression. Patient is progressing with functional mobility and ambulation with decreased reliance upon RW and beginning of transition to quad cane at this time. Patient's dynamic mobility in weightbearing position continues to be limited by R knee pain rather than prosthetic strength. She is progressing with decreased UE assistance required for ambulation. Patient's condition has the potential to improve in response to therapy. Maximum improvement is yet to be obtained. The anticipated improvement is attainable and reasonable in a generally  predictable time. Patient will benefit from skilled physical therapy for ambulation and transfers with prosthesis, strength and stability, and increase stability with functional mobility for quality of life and decreased falls risk     PT Education - 10/04/19 1000    Education Details  ambulate with QC    Person(s) Educated  Patient    Methods  Explanation;Demonstration;Tactile cues;Verbal cues    Comprehension  Verbalized understanding;Returned demonstration;Verbal cues required;Tactile cues required       PT Short  Term Goals - 09/27/19 1023      PT SHORT TERM GOAL #1   Title  Patient will be independent in home exercise program to improve strength/mobility for better functional independence with ADLs.    Baseline  10/7: HEP given; 11/23: HEP compliant    Time  2    Period  Weeks    Status  Partially Met    Target Date  08/25/19        PT Long Term Goals - 10/04/19 1017      PT LONG TERM GOAL #1   Title  Patient will tolerate static standing for > 5 minutes for standing ADL performance with weight acceptance onto prosthetic limb to improve quality of life and mobility.    Baseline  10/7: 1 minute 39 seconds with heavy UE support on RW 11/23: 1 minute 50 seconds static stand terminated due to back pain not knees. , 3 minutes of walking/dynamic standing.    Time  8    Period  Weeks    Status  Partially Met    Target Date  11/29/19      PT LONG TERM GOAL #2   Title  Patient will ambulate >100 feet with RW, mod I with right prosthetic exhibiting reciprocal gait pattern with good  management of prosthetic and good safety awareness for improved home ambulation    Baseline  10/7: unable to ambulate 11/23: 86 ft with RW    Time  8    Period  Weeks    Status  Partially Met    Target Date  11/29/19      PT LONG TERM GOAL #3   Title  Patient (< 65 years old) will complete five times sit to stand test in < 10 seconds indicating an increased LE strength and improved balance.     Baseline  10/7: 21 seconds with heavy BUE support from raised surface 11/23: 16 seconds with BUE support and RW    Time  8    Period  Weeks    Status  Partially Met    Target Date  11/29/19      PT LONG TERM GOAL #4   Title  Patient will increase BLE gross strength to 4+/5 as to improve functional strength for independent gait, increased standing tolerance and increased ADL ability.    Baseline  10/7: L 3+/5 R 4-/5 11/23: L 4-/5 R 4/5 with knee 4-/5 due to pain    Time  8    Period  Weeks    Status  Partially Met    Target Date  11/29/19            Plan - 10/04/19 1016    Clinical Impression Statement  Goals performed 11/23, see this date for goal progression. Patient is progressing with functional mobility and ambulation with decreased reliance upon RW and beginning of transition to quad cane at this time. Patient's dynamic mobility in weightbearing position continues to be limited by R knee pain rather than prosthetic strength. She is progressing with decreased UE assistance required for ambulation. Patient's condition has the potential to improve in response to therapy. Maximum improvement is yet to be obtained. The anticipated improvement is attainable and reasonable in a generally predictable time. Patient will benefit from skilled physical therapy for ambulation and transfers with prosthesis, strength and stability, and increase stability with functional mobility for quality of life and decreased falls risk    Personal Factors and Comorbidities  Age;Comorbidity 3+;Education;Finances;Fitness;Past/Current Experience;Social Background;Time  since onset of injury/illness/exacerbation;Other    Comorbidities  anemia, anxiety, arthritis, back pain, bradycardia, CHF, CKD stage III, DM, diabetic nephropathy, dyspnea, GERD, heart murmer, HLD, hyperparathyroidism, HTN, blind in L eye, obesity, oncychomycosis    Examination-Activity Limitations  Bathing;Bed Mobility;Caring for  Others;Bend;Carry;Dressing;Continence;Hygiene/Grooming;Stairs;Squat;Reach Overhead;Locomotion Level;Lift;Stand;Toileting;Transfers    Examination-Participation Restrictions  Church;Cleaning;Community Activity;Interpersonal Relationship;Driving;Laundry;Volunteer;Shop;Personal Finances;Meal Prep;Yard Work;Other    Stability/Clinical Decision Making  Evolving/Moderate complexity    Rehab Potential  Fair    PT Frequency  2x / week    PT Duration  8 weeks    PT Treatment/Interventions  ADLs/Self Care Home Management;Aquatic Therapy;Biofeedback;Cryotherapy;Electrical Stimulation;Iontophoresis 46m/ml Dexamethasone;Moist Heat;Traction;Ultrasound;Therapeutic activities;Functional mobility training;Stair training;Gait training;DME Instruction;Therapeutic exercise;Balance training;Neuromuscular re-education;Patient/family education;Manual techniques;Wheelchair mTax adviserCompression bandaging;Scar mobilization;Passive range of motion;Dry needling;Energy conservation;Splinting;Taping    PT Next Visit Plan  ambulate with RW    PT Home Exercise Plan  prone 10 minutes a day, wear prosthesis 1 hour a day, side lie posterior kick backs 10x    Consulted and Agree with Plan of Care  Patient       Patient will benefit from skilled therapeutic intervention in order to improve the following deficits and impairments:  Abnormal gait, Cardiopulmonary status limiting activity, Decreased activity tolerance, Decreased balance, Decreased knowledge of precautions, Decreased endurance, Decreased coordination, Decreased knowledge of use of DME, Decreased mobility, Decreased range of motion, Decreased safety awareness, Difficulty walking, Decreased strength, Hypomobility, Impaired flexibility, Impaired perceived functional ability, Impaired sensation, Obesity, Prosthetic Dependency, Postural dysfunction, Improper body mechanics, Pain  Visit Diagnosis: Other abnormalities of gait and mobility  Muscle  weakness (generalized)  Unsteadiness on feet     Problem List Patient Active Problem List   Diagnosis Date Noted  . Chronic pain syndrome 07/13/2019  . Wound dehiscence 05/29/2019  . Hx of BKA, left ([place] 05/24/2019  . PVD (peripheral vascular disease) ([place] 05/13/2019  . Chronic osteomyelitis of ankle and foot, left ([place] 05/05/2019  . Chronic diastolic CHF (congestive heart failure) ([place] 04/19/2019  . Leg edema 04/19/2019  . Acute bacterial endocarditis 04/19/2019  . Chest pain of uncertain etiology 070/35/0093 . Ulcer of left ankle ([place] 04/14/2019  . Ankle deformity, left 01/21/2019  . Diabetic polyneuropathy associated with type 2 diabetes mellitus ([place] 01/21/2019  . Aortic valve endocarditis   . Bacteremia due to Streptococcus 12/24/2018  . Diabetic ulcer of left ankle associated with diabetes mellitus due to underlying condition, with fat layer exposed ([place] 11/09/2018  . Malignant hypertension (arteriolar nephrosclerosis), stage 1-4 or unspecified chronic kidney disease 07/15/2018  . Stage 4 chronic kidney disease ([place] 07/15/2018  . Morbid obesity with BMI of 45.0-49.9, adult ([place] 07/15/2018  . Excessive daytime sleepiness 07/15/2018  . Other fatigue 03/11/2018  . Essential hypertension 03/11/2018  . Vitamin D deficiency 03/11/2018  . Congestive heart failure ([place] 03/11/2018  . Primary osteoarthritis of right knee 01/06/2018  . Uncontrolled type 2 diabetes mellitus with polyneuropathy ([place] 10/03/2017  . Chronic pain of right knee 10/02/2017  . Obesity 10/02/2017  . Charcot's joint of left foot 08/29/2017  . Lymphedema 09/06/2016  . Hyponatremia with extracellular fluid depletion 02/02/2016  . Nausea with vomiting, unspecified 02/02/2016  . Closed nondisplaced fracture of right patella 08/23/2015  . Acute cystitis without hematuria 08/14/2014  . Colitis 08/14/2014  . HTN (hypertension), malignant 08/14/2014  . Acute renal failure superimposed on stage 3  chronic kidney disease ([place] 06/26/2014  . Hyperparathyroidism, unspecified ([place] 02/24/2014  . Allergic rhinitis 02/22/2014  . Onychomycosis 02/22/2014  . Anxiety 11/05/2013  . Bradycardia  05/14/2013  . Gastro-esophageal reflux disease without esophagitis 04/05/2013  . Anemia in other chronic diseases classified elsewhere 01/27/2013   Janna Arch, PT, DPT   10/04/2019, 10:18 AM  [place] MAIN [facility] SERVICES [address] [place], [place], [address] Phone: [phone]   Fax:  [phone]  Name: Rachel Holmes MRN: [id number] Date of Birth: [birth year]

## 2019-10-06 ENCOUNTER — Ambulatory Visit: Payer: Medicare HMO

## 2019-10-07 ENCOUNTER — Ambulatory Visit (INDEPENDENT_AMBULATORY_CARE_PROVIDER_SITE_OTHER): Payer: Medicare HMO | Admitting: Vascular Surgery

## 2019-10-11 ENCOUNTER — Ambulatory Visit: Payer: Medicare HMO | Attending: Nurse Practitioner

## 2019-10-11 ENCOUNTER — Other Ambulatory Visit: Payer: Self-pay

## 2019-10-11 DIAGNOSIS — M6281 Muscle weakness (generalized): Secondary | ICD-10-CM | POA: Diagnosis present

## 2019-10-11 DIAGNOSIS — R2681 Unsteadiness on feet: Secondary | ICD-10-CM

## 2019-10-11 DIAGNOSIS — R2689 Other abnormalities of gait and mobility: Secondary | ICD-10-CM | POA: Diagnosis present

## 2019-10-11 NOTE — Therapy (Signed)
[place] MAIN [facility] SERVICES [address] [place], [place], [address] Phone: [phone]   Fax:  [phone]  Physical Therapy Treatment  Patient Details  Name: Rachel Holmes MRN: [id number] Date of Birth: [birth year] Referring Provider (PT): Eulogio Ditch    Encounter Date: 10/11/2019  PT End of Session - 10/11/19 1107    Visit Number  13    Number of Visits  28    Date for PT Re-Evaluation  11/29/19    Authorization Type  3/10 PN 11/23    PT Start Time  0930    PT Stop Time  1014    PT Time Calculation (min)  44 min    Equipment Utilized During Treatment  Gait belt;Other (comment)   L prosthesis   Activity Tolerance  Patient tolerated treatment well;Patient limited by fatigue    Behavior During Therapy  [facility] for tasks assessed/performed       Past Medical History:  Diagnosis Date  . Allergic rhinitis   . Allergy   . Anemia   . Anxiety   . Arthritis   . Back pain   . Bradycardia   . Breast mass    Patient can no longer palpate specific masses but showed tech general area of concern  . CHF (congestive heart failure) ([place])   . CKD (chronic kidney disease)    STAGE 3  . Constipation   . Diabetes mellitus without complication ([place])   . Diabetic nephropathy ([place])   . Dyspnea   . GERD (gastroesophageal reflux disease)   . Heart murmur   . HLD (hyperlipidemia)   . Hyperparathyroidism ([place])   . Hypertension   . Joint pain   . Leg edema   . Legally blind in left eye, as defined in [place]   . Lymphedema   . Obesity   . Onychomycosis     Past Surgical History:  Procedure Laterality Date  . ABDOMINAL HYSTERECTOMY    . AMPUTATION Left 05/05/2019   Procedure: AMPUTATION BELOW KNEE;  Surgeon: Katha Cabal, MD;  Location: [facility] ORS;  Service: Vascular;  Laterality: Left;  . BREAST BIOPSY Left 2014   FNA 12:00 position - Negative  . EYE SURGERY Left 2007   removed a lens, no lens implanted  . IR FLUORO GUIDE CV LINE RIGHT   12/29/2018  . IR REMOVAL TUN CV CATH W/O FL  02/19/2019  . IR US GUIDE VASC ACCESS RIGHT  12/29/2018  . TEE WITHOUT CARDIOVERSION N/A 12/28/2018   Procedure: TRANSESOPHAGEAL ECHOCARDIOGRAM (TEE);  Surgeon: Sueanne Margarita, MD;  Location: [facility] ENDOSCOPY;  Service: Cardiovascular;  Laterality: N/A;  . VAGINAL HYSTERECTOMY     abdominal hyst, not a vaginal hyst    There were no vitals filed for this visit.  Subjective Assessment - 10/11/19 0936    Subjective  Patient reports no falls or LOB since last session. Walked yesterday but had too many socks on. was able to self correct.    Pertinent History  Patient is a pleasant 58 year old female who presents for gait training/ stability for L BKA. Recently obtained her prosthesis last Friday 08/06/19 and has not worn it since the prosthetists office. PMH includes anemia, anxiety, arthritis, back pain, bradycardia, CHF, CKD stage II, DM, diabetic nephropathy, dyspnea, GERD, HLD, hyperparathyroidism, hypotension, legally blind in left eye, lymphedema, obesity, onychomycosis. Left BKA performed 05/05/19, noted in history that patient had a fall after procedure injuring the anterior portion of the suture line. Currently  using a manual chair to get around the home. At this time she can't get into bathroom due to wheelchair, is waiting for a handicap apartment.    Limitations  Lifting;Standing;Walking;House hold activities    How long can you sit comfortably?  n/a    How long can you stand comfortably?  only stood once with prosthetist    How long can you walk comfortably?  not able to walk yet.    Patient Stated Goals  to walk again with prosthesis    Currently in Pain?  Yes    Pain Score  4     Pain Location  Knee    Pain Orientation  Right    Pain Descriptors / Indicators  Aching    Pain Type  Chronic pain    Pain Onset  More than a month ago    Pain Frequency  Intermittent         vitals: 167/96 pulse 80   After exercise :  Vitals; 169/67 pulse  84-keep an eye on.    Ambulating with RW 80 ft x 2 trials. . CGA/ min A for weight shift and placement of LE's and UE for neutralizing body mechanics for optimal muscle recruitment and stability.    Donning/doffing socks for proper fit, initially did not wear enough socks and had excessive ER of prosthetic limb. Educated on wear patterning.    Quad cane: standing marches in // bars: 10x each LE (added to HEP); one LE at a time, 10x each LE, then 10x alternating, 30x total: very challenging to patient.    Quad cane: horizontal head turns with static standing 60 seconds (added to HEP)     Standing hip extension 12x each LE BUE support on RW  ; very challenging for stabilizing on RLE due to knee discomfort.    Standing hip flexion 12x each LE, BUE support on RW, painful in R knee.    4" step toe taps 10x each LE, BUE support   Access Code: [email or web address]  URL: [email or web address]  Date: 10/11/2019  Prepared by: Janna Arch   Exercises Seated Hip Abduction with Resistance - 10 reps - 2 sets - 5 hold - 1x daily - 7x weekly Seated March with Resistance - 10 reps - 2 sets - 5 hold - 1x daily - 7x weekly Seated Hip Internal Rotation with Ball and Resistance - 10 reps - 2 sets - 5 hold - 1x daily - 7x weekly  Pt educated throughout session about proper posture and technique with exercises. Improved exercise technique, movement at target joints, use of target muscles after min to mod verbal, visual, tactile cues.   Patient educated on need for compliance with HEP, is more challenged with standing interventions due to noncompliance over the weekend.    Patient demonstrates more fatigue with standing interventions this session. She requires continued education on proper sock wear for ambulation. Resisted seated interventions for LE strength given to patient. All standing interventions require use of 5lb ankle weight for reduced knee pain.  Patient will benefit from skilled physical  therapy for ambulation and transfers with prosthesis, strength and stability, and increase stability with functional mobility for quality of life and decreased falls risk                      PT Education - 10/11/19 1107    Education Details  need for compliance with HEP    Person(s) Educated  Patient    Methods  Explanation;Demonstration;Tactile cues;Verbal cues    Comprehension  Verbalized understanding;Returned demonstration;Verbal cues required;Tactile cues required       PT Short Term Goals - 09/27/19 1023      PT SHORT TERM GOAL #1   Title  Patient will be independent in home exercise program to improve strength/mobility for better functional independence with ADLs.    Baseline  10/7: HEP given; 11/23: HEP compliant    Time  2    Period  Weeks    Status  Partially Met    Target Date  08/25/19        PT Long Term Goals - 10/04/19 1017      PT LONG TERM GOAL #1   Title  Patient will tolerate static standing for > 5 minutes for standing ADL performance with weight acceptance onto prosthetic limb to improve quality of life and mobility.    Baseline  10/7: 1 minute 39 seconds with heavy UE support on RW 11/23: 1 minute 50 seconds static stand terminated due to back pain not knees. , 3 minutes of walking/dynamic standing.    Time  8    Period  Weeks    Status  Partially Met    Target Date  11/29/19      PT LONG TERM GOAL #2   Title  Patient will ambulate >100 feet with RW, mod I with right prosthetic exhibiting reciprocal gait pattern with good  management of prosthetic and good safety awareness for improved home ambulation    Baseline  10/7: unable to ambulate 11/23: 86 ft with RW    Time  8    Period  Weeks    Status  Partially Met    Target Date  11/29/19      PT LONG TERM GOAL #3   Title  Patient (< 56 years old) will complete five times sit to stand test in < 10 seconds indicating an increased LE strength and improved balance.    Baseline  10/7: 21  seconds with heavy BUE support from raised surface 11/23: 16 seconds with BUE support and RW    Time  8    Period  Weeks    Status  Partially Met    Target Date  11/29/19      PT LONG TERM GOAL #4   Title  Patient will increase BLE gross strength to 4+/5 as to improve functional strength for independent gait, increased standing tolerance and increased ADL ability.    Baseline  10/7: L 3+/5 R 4-/5 11/23: L 4-/5 R 4/5 with knee 4-/5 due to pain    Time  8    Period  Weeks    Status  Partially Met    Target Date  11/29/19            Plan - 10/11/19 1108    Clinical Impression Statement  Patient demonstrates more fatigue with standing interventions this session. She requires continued education on proper sock wear for ambulation. Resisted seated interventions for LE strength given to patient. All standing interventions require use of 5lb ankle weight for reduced knee pain.  Patient will benefit from skilled physical therapy for ambulation and transfers with prosthesis, strength and stability, and increase stability with functional mobility for quality of life and decreased falls risk    Personal Factors and Comorbidities  Age;Comorbidity 3+;Education;Finances;Fitness;Past/Current Experience;Social Background;Time since onset of injury/illness/exacerbation;Other    Comorbidities  anemia, anxiety, arthritis, back pain, bradycardia, CHF, CKD stage III, DM, diabetic nephropathy, dyspnea, GERD, heart  murmer, HLD, hyperparathyroidism, HTN, blind in L eye, obesity, oncychomycosis    Examination-Activity Limitations  Bathing;Bed Mobility;Caring for Others;Bend;Carry;Dressing;Continence;Hygiene/Grooming;Stairs;Squat;Reach Overhead;Locomotion Level;Lift;Stand;Toileting;Transfers    Examination-Participation Restrictions  Church;Cleaning;Community Activity;Interpersonal Relationship;Driving;Laundry;Volunteer;Shop;Personal Finances;Meal Prep;Yard Work;Other    Stability/Clinical Decision Making   Evolving/Moderate complexity    Rehab Potential  Fair    PT Frequency  2x / week    PT Duration  8 weeks    PT Treatment/Interventions  ADLs/Self Care Home Management;Aquatic Therapy;Biofeedback;Cryotherapy;Electrical Stimulation;Iontophoresis 38m/ml Dexamethasone;Moist Heat;Traction;Ultrasound;Therapeutic activities;Functional mobility training;Stair training;Gait training;DME Instruction;Therapeutic exercise;Balance training;Neuromuscular re-education;Patient/family education;Manual techniques;Wheelchair mTax adviserCompression bandaging;Scar mobilization;Passive range of motion;Dry needling;Energy conservation;Splinting;Taping    PT Next Visit Plan  ambulate with RW    PT Home Exercise Plan  prone 10 minutes a day, wear prosthesis 1 hour a day, side lie posterior kick backs 10x    Consulted and Agree with Plan of Care  Patient       Patient will benefit from skilled therapeutic intervention in order to improve the following deficits and impairments:  Abnormal gait, Cardiopulmonary status limiting activity, Decreased activity tolerance, Decreased balance, Decreased knowledge of precautions, Decreased endurance, Decreased coordination, Decreased knowledge of use of DME, Decreased mobility, Decreased range of motion, Decreased safety awareness, Difficulty walking, Decreased strength, Hypomobility, Impaired flexibility, Impaired perceived functional ability, Impaired sensation, Obesity, Prosthetic Dependency, Postural dysfunction, Improper body mechanics, Pain  Visit Diagnosis: Other abnormalities of gait and mobility  Muscle weakness (generalized)  Unsteadiness on feet     Problem List Patient Active Problem List   Diagnosis Date Noted  . Chronic pain syndrome 07/13/2019  . Wound dehiscence 05/29/2019  . Hx of BKA, left ([place] 05/24/2019  . PVD (peripheral vascular disease) ([place] 05/13/2019  . Chronic osteomyelitis of ankle and foot, left ([facility] [place] 05/05/2019  .  Chronic diastolic CHF (congestive heart failure) ([place] 04/19/2019  . Leg edema 04/19/2019  . Acute bacterial endocarditis 04/19/2019  . Chest pain of uncertain etiology 078/24/2353 . Ulcer of left ankle ([place] 04/14/2019  . Ankle deformity, left 01/21/2019  . Diabetic polyneuropathy associated with type 2 diabetes mellitus ([place] 01/21/2019  . Aortic valve endocarditis   . Bacteremia due to Streptococcus 12/24/2018  . Diabetic ulcer of left ankle associated with diabetes mellitus due to underlying condition, with fat layer exposed ([place] 11/09/2018  . Malignant hypertension (arteriolar nephrosclerosis), stage 1-4 or unspecified chronic kidney disease 07/15/2018  . Stage 4 chronic kidney disease ([place] 07/15/2018  . Morbid obesity with BMI of 45.0-49.9, adult ([place] 07/15/2018  . Excessive daytime sleepiness 07/15/2018  . Other fatigue 03/11/2018  . Essential hypertension 03/11/2018  . Vitamin D deficiency 03/11/2018  . Congestive heart failure ([place] 03/11/2018  . Primary osteoarthritis of right knee 01/06/2018  . Uncontrolled type 2 diabetes mellitus with polyneuropathy ([place] 10/03/2017  . Chronic pain of right knee 10/02/2017  . Obesity 10/02/2017  . Charcot's joint of left foot 08/29/2017  . Lymphedema 09/06/2016  . Hyponatremia with extracellular fluid depletion 02/02/2016  . Nausea with vomiting, unspecified 02/02/2016  . Closed nondisplaced fracture of right patella 08/23/2015  . Acute cystitis without hematuria 08/14/2014  . Colitis 08/14/2014  . HTN (hypertension), malignant 08/14/2014  . Acute renal failure superimposed on stage 3 chronic kidney disease ([place] 06/26/2014  . Hyperparathyroidism, unspecified ([place] 02/24/2014  . Allergic rhinitis 02/22/2014  . Onychomycosis 02/22/2014  . Anxiety 11/05/2013  . Bradycardia 05/14/2013  . Gastro-esophageal reflux disease without esophagitis 04/05/2013  . Anemia in other chronic diseases classified elsewhere 01/27/2013   MJanna Arch PT, DPT  10/11/2019, 11:08 AM  [place] MAIN [facility] SERVICES [address] [place], [place], [address] Phone: [phone]   Fax:  [phone]  Name: SHALIAH WANN MRN: [id number] Date of Birth: 1961-01-15

## 2019-10-13 ENCOUNTER — Ambulatory Visit: Payer: Medicare HMO

## 2019-10-18 ENCOUNTER — Encounter: Payer: Self-pay | Admitting: Student in an Organized Health Care Education/Training Program

## 2019-10-18 ENCOUNTER — Ambulatory Visit: Payer: Medicare HMO

## 2019-10-18 ENCOUNTER — Ambulatory Visit
Payer: Medicare HMO | Attending: Student in an Organized Health Care Education/Training Program | Admitting: Student in an Organized Health Care Education/Training Program

## 2019-10-18 ENCOUNTER — Ambulatory Visit (INDEPENDENT_AMBULATORY_CARE_PROVIDER_SITE_OTHER): Payer: Medicare HMO | Admitting: Vascular Surgery

## 2019-10-18 ENCOUNTER — Other Ambulatory Visit: Payer: Self-pay

## 2019-10-18 DIAGNOSIS — M1711 Unilateral primary osteoarthritis, right knee: Secondary | ICD-10-CM

## 2019-10-18 DIAGNOSIS — G8929 Other chronic pain: Secondary | ICD-10-CM | POA: Diagnosis not present

## 2019-10-18 DIAGNOSIS — M25561 Pain in right knee: Secondary | ICD-10-CM | POA: Diagnosis not present

## 2019-10-18 DIAGNOSIS — G894 Chronic pain syndrome: Secondary | ICD-10-CM | POA: Diagnosis not present

## 2019-10-18 DIAGNOSIS — Z89512 Acquired absence of left leg below knee: Secondary | ICD-10-CM

## 2019-10-18 DIAGNOSIS — I739 Peripheral vascular disease, unspecified: Secondary | ICD-10-CM

## 2019-10-18 DIAGNOSIS — N184 Chronic kidney disease, stage 4 (severe): Secondary | ICD-10-CM

## 2019-10-18 NOTE — Progress Notes (Deleted)
MRN : [id number]  Rachel Holmes is a 58 y.o. (February 18, 1961) female who presents with chief complaint of No chief complaint on file. [name]  History of Present Illness:   Date 05/05/2019:  Left below-the-knee amputation  The patient returns to the office for followup and review of the noninvasive studies. There have been no interval changes in lower extremity symptoms. No interval shortening of the patient's claudication distance or development of rest pain symptoms. No new ulcers or wounds have occurred since the last visit.  There have been no significant changes to the patient's overall health care.  The patient denies amaurosis fugax or recent TIA symptoms. There are no recent neurological changes noted. The patient denies history of DVT, PE or superficial thrombophlebitis. The patient denies recent episodes of angina or shortness of breath.   ABI Rt=*** and Lt=***  (previous ABI's Rt=*** and Lt=***) Duplex ultrasound of the ***  No outpatient medications have been marked as taking for the 10/18/19 encounter (Appointment) with Delana Meyer, Dolores Lory, MD.    Past Medical History:  Diagnosis Date  . Allergic rhinitis   . Allergy   . Anemia   . Anxiety   . Arthritis   . Back pain   . Bradycardia   . Breast mass    Patient can no longer palpate specific masses but showed tech general area of concern  . CHF (congestive heart failure) ([place])   . CKD (chronic kidney disease)    STAGE 3  . Constipation   . Diabetes mellitus without complication ([place])   . Diabetic nephropathy ([place])   . Dyspnea   . GERD (gastroesophageal reflux disease)   . Heart murmur   . HLD (hyperlipidemia)   . Hyperparathyroidism ([place])   . Hypertension   . Joint pain   . Leg edema   . Legally blind in left eye, as defined in [place]   . Lymphedema   . Obesity   . Onychomycosis     Past Surgical History:  Procedure Laterality Date  . ABDOMINAL HYSTERECTOMY    . AMPUTATION Left 05/05/2019   Procedure:  AMPUTATION BELOW KNEE;  Surgeon: Katha Cabal, MD;  Location: [facility] ORS;  Service: Vascular;  Laterality: Left;  . BREAST BIOPSY Left 2014   FNA 12:00 position - Negative  . EYE SURGERY Left 2007   removed a lens, no lens implanted  . IR FLUORO GUIDE CV LINE RIGHT  12/29/2018  . IR REMOVAL TUN CV CATH W/O FL  02/19/2019  . IR US GUIDE VASC ACCESS RIGHT  12/29/2018  . TEE WITHOUT CARDIOVERSION N/A 12/28/2018   Procedure: TRANSESOPHAGEAL ECHOCARDIOGRAM (TEE);  Surgeon: Sueanne Margarita, MD;  Location: [facility] ENDOSCOPY;  Service: Cardiovascular;  Laterality: N/A;  . VAGINAL HYSTERECTOMY     abdominal hyst, not a vaginal hyst    Social History Social History   Tobacco Use  . Smoking status: Never Smoker  . Smokeless tobacco: Never Used  Substance Use Topics  . Alcohol use: No  . Drug use: Never    Family History Family History  Problem Relation Age of Onset  . Breast cancer Sister 44  . Diabetes Sister   . Diabetes Mother   . Hypertension Mother   . Hyperlipidemia Mother   . Eating disorder Mother   . Obesity Mother     Allergies  Allergen Reactions  . Statins Shortness Of Breath and Other (See Comments)    Wheezing, short of breath  . Penicillins Hives and Other (  See Comments)    Has patient had a PCN reaction causing immediate rash, facial/tongue/throat swelling, SOB or lightheadedness with hypotension: No Has patient had a PCN reaction causing severe rash involving mucus membranes or skin necrosis: No Has patient had a PCN reaction that required hospitalization No Has patient had a PCN reaction occurring within the last 10 years: No If all of the above answers are "NO", then may proceed with Cephalosporin use.  . Codeine Nausea Only  . Ibuprofen Other (See Comments)    Reaction:  Raises pts BP  . Percocet [Oxycodone-Acetaminophen] Nausea Only  . Tramadol Nausea Only and Other (See Comments)    Can take if she has eaten. Currently takes with good results   . Vicodin  [Hydrocodone-Acetaminophen] Itching and Nausea Only     REVIEW OF SYSTEMS (Negative unless checked)  Constitutional: [] Weight loss  [] Fever  [] Chills Cardiac: [] Chest pain   [] Chest pressure   [] Palpitations   [] Shortness of breath when laying flat   [] Shortness of breath with exertion. Vascular:  [] Pain in legs with walking   [] Pain in legs at rest  [] History of DVT   [] Phlebitis   [] Swelling in legs   [] Varicose veins   [] Non-healing ulcers Pulmonary:   [] Uses home oxygen   [] Productive cough   [] Hemoptysis   [] Wheeze  [] COPD   [] Asthma Neurologic:  [] Dizziness   [] Seizures   [] History of stroke   [] History of TIA  [] Aphasia   [] Vissual changes   [] Weakness or numbness in arm   [] Weakness or numbness in leg Musculoskeletal:   [] Joint swelling   [] Joint pain   [] Low back pain Hematologic:  [] Easy bruising  [] Easy bleeding   [] Hypercoagulable state   [] Anemic Gastrointestinal:  [] Diarrhea   [] Vomiting  [] Gastroesophageal reflux/heartburn   [] Difficulty swallowing. Genitourinary:  [] Chronic kidney disease   [] Difficult urination  [] Frequent urination   [] Blood in urine Skin:  [] Rashes   [] Ulcers  Psychological:  [] History of anxiety   []  History of major depression.  Physical Examination  There were no vitals filed for this visit. There is no height or weight on file to calculate BMI. Gen: WD/WN, NAD Head: [place]/AT, No temporalis wasting.  Ear/Nose/Throat: Hearing grossly intact, nares w/o erythema or drainage Eyes: PER, EOMI, sclera nonicteric.  Neck: Supple, no large masses.   Pulmonary:  Good air movement, no audible wheezing bilaterally, no use of accessory muscles.  Cardiac: RRR, no JVD Vascular: *** Vessel Right Left  Radial Palpable Palpable  Ulnar Palpable Palpable  Brachial Palpable Palpable  Carotid Palpable Palpable  Femoral Palpable Palpable  Popliteal Palpable Palpable  PT Palpable Palpable  DP Palpable Palpable  Gastrointestinal: Non-distended. No guarding/no  peritoneal signs.  Musculoskeletal: M/S 5/5 throughout.  No deformity or atrophy.  Neurologic: CN 2-12 intact. Symmetrical.  Speech is fluent. Motor exam as listed above. Psychiatric: Judgment intact, Mood & affect appropriate for pt's clinical situation. Dermatologic: No rashes or ulcers noted.  No changes consistent with cellulitis. Lymph : No lichenification or skin changes of chronic lymphedema.  CBC Lab Results  Component Value Date   WBC 9.1 05/28/2019   HGB 9.4 (L) 05/28/2019   HCT 29.2 (L) 05/28/2019   MCV 91.0 05/28/2019   PLT 243 05/28/2019    BMET    Component Value Date/Time   NA 136 05/28/2019 2252   NA 139 06/25/2018 1050   NA 137 11/21/2013 0627   K 4.3 05/28/2019 2252   K 4.6 11/21/2013 0627   CL 103 05/28/2019  2252   CL 107 11/21/2013 0627   CO2 20 (L) 05/28/2019 2252   CO2 24 11/21/2013 0627   GLUCOSE 90 05/28/2019 2252   GLUCOSE 95 11/21/2013 0627   BUN 66 (H) 05/28/2019 2252   BUN 43 (H) 06/25/2018 1050   BUN 34 (H) 11/21/2013 0627   CREATININE 3.25 (H) 05/28/2019 2252   CREATININE 1.80 (H) 09/09/2014 1220   CALCIUM 8.8 (L) 05/28/2019 2252   CALCIUM 8.8 09/09/2014 1220   GFRNONAA 15 (L) 05/28/2019 2252   GFRNONAA 31 (L) 09/09/2014 1220   GFRNONAA 26 (L) 11/21/2013 0627   GFRAA 17 (L) 05/28/2019 2252   GFRAA 38 (L) 09/09/2014 1220   GFRAA 30 (L) 11/21/2013 0627   CrCl cannot be calculated (Patient's most recent lab result is older than the maximum 21 days allowed.).  COAG Lab Results  Component Value Date   INR 0.8 05/29/2019   INR 1.1 04/30/2019    Radiology No results found.  Outside Studies/Documentation *** pages of outside documents were reviewed.  They showed ***.  Assessment/Plan There are no diagnoses linked to this encounter.   [name], MD  10/18/2019 8:04 AM

## 2019-10-18 NOTE — Assessment & Plan Note (Signed)
Orders Placed This Encounter  Procedures  . Genicular NB (Schedule)    Indication(s):  Sub-acute knee pain    Standing Status:   Future    Standing Expiration Date:   11/17/2019    Scheduling Instructions:     Side: RIGHT     Sedation: WITHOUT     Timeframe: As soon as schedule allows    Order Specific Question:   Where will this procedure be performed?    Answer:   [facility]

## 2019-10-18 NOTE — Progress Notes (Signed)
Pain Management Virtual Encounter Note - Virtual Visit via [place] (real-time audio visits between healthcare provider and patient).   Patient's Phone No. & Preferred Pharmacy:  [phone] (home); [phone] (mobile); (Preferred) [phone] [email or web address]  [facility] ([address]), [place] ([place]) [place] [address] Phone: [phone] Fax: [phone]  [id number] - [place], [place] [address] [address] Phone: [phone] Fax: [phone]    Pre-screening note:  Our staff contacted Rachel Holmes and offered her an "in person", "face-to-face" appointment versus a telephone encounter. She indicated preferring the telephone encounter, at this time.   Reason for Virtual Visit: COVID-19*  Social distancing based on CDC and AMA recommendations.   I contacted CECILLIA BIEBER on 10/18/2019 via video conference.      I clearly identified myself as Gillis Santa, MD. I verified that I was speaking with the correct person using two identifiers (Name: Rachel Holmes, and date of birth: January 01, 1961).  Advanced Informed Consent I sought verbal advanced consent from Rachel Holmes for virtual visit interactions. I informed Rachel Holmes of possible security and privacy concerns, risks, and limitations associated with providing "not-in-person" medical evaluation and management services. I also informed Rachel Holmes of the availability of "in-person" appointments. Finally, I informed her that there would be a charge for the virtual visit and that she could be  personally, fully or partially, financially responsible for it. Rachel Holmes expressed understanding and agreed to proceed.   Historic Elements   Rachel Holmes is a 58 y.o. year old, female patient evaluated today after her last encounter by our practice on 09/29/2019. Rachel Holmes  has a past medical  history of Allergic rhinitis, Allergy, Anemia, Anxiety, Arthritis, Back pain, Bradycardia, Breast mass, CHF (congestive heart failure) ([place]), CKD (chronic kidney disease), Constipation, Diabetes mellitus without complication ([place]), Diabetic nephropathy ([place]), Dyspnea, GERD (gastroesophageal reflux disease), Heart murmur, HLD (hyperlipidemia), Hyperparathyroidism ([place]), Hypertension, Joint pain, Leg edema, Legally blind in left eye, as defined in [place], Lymphedema, Obesity, and Onychomycosis. She also  has a past surgical history that includes Abdominal hysterectomy; TEE without cardioversion (N/A, 12/28/2018); IR Fluoro Guide CV Line Right (12/29/2018); IR US Guide Vasc Access Right (12/29/2018); IR Removal Tun Cv Cath W/O FL (02/19/2019); Vaginal hysterectomy; Breast biopsy (Left, 2014); Eye surgery (Left, 2007); and Amputation (Left, 05/05/2019). Rachel Holmes has a current medication list which includes the following prescription(s): albuterol, amlodipine, aspirin, one touch ultra 2, clobetasol cream, clonidine, freestyle libre 14 day reader, freestyle libre 14 day sensor, docusate sodium, trulicity, duloxetine, ensure max protein, fluticasone, gabapentin, glucosamine-chondroitin, glucose blood, novolog flexpen, insulin pen needle, isosorbide mononitrate, onetouch ultrasoft, multivitamin with minerals, olmesartan, omeprazole, ondansetron, sodium bicarbonate, torsemide, tramadol, vitamin b-12, and ascorbic acid. She  reports that she has never smoked. She has never used smokeless tobacco. She reports that she does not drink alcohol or use drugs. Rachel Holmes is allergic to statins; penicillins; codeine; ibuprofen; percocet [oxycodone-acetaminophen]; tramadol; and vicodin [hydrocodone-acetaminophen].   HPI  Today, she is being contacted for a post-procedure assessment.   No benefit after R IA knee steroid injection. We also drained fluid from her knee joint during this visit which the patient states did help for the  first 10-14 days in regards to her swelling.   Patient has tried 2 rounds IA hyalgan which too was not as effective.  Discussed right knee genicular nerve block for persistent RIGHT knee pain  refractory to visco-therapy and steroid therapy.  UDS:  Summary  Date Value Ref Range Status  07/13/2019 Note  Final    Comment:    ==================================================================== Compliance Drug Analysis, Ur ==================================================================== Test                             Result       Flag       Units Drug Present and Declared for Prescription Verification   Tramadol                       5145         EXPECTED   ng/mg creat   O-Desmethyltramadol            3374         EXPECTED   ng/mg creat   N-Desmethyltramadol            692          EXPECTED   ng/mg creat    Source of tramadol is a prescription medication. O-desmethyltramadol    and N-desmethyltramadol are expected metabolites of tramadol.   Gabapentin                     PRESENT      EXPECTED   Duloxetine                     PRESENT      EXPECTED   Clonidine                      PRESENT      EXPECTED Drug Present not Declared for Prescription Verification   Diphenhydramine                PRESENT      UNEXPECTED Drug Absent but Declared for Prescription Verification   Salicylate                     Not Detected UNEXPECTED    Aspirin, as indicated in the declared medication list, is not always    detected even when used as directed. ==================================================================== Test                      Result    Flag   Units      Ref Range   Creatinine              96               mg/dL      >=20 ==================================================================== Declared Medications:  The flagging and interpretation on this report are based on the  following declared medications.  Unexpected results may arise from  inaccuracies in the declared  medications.  **Note: The testing scope of this panel includes these medications:  Clonidine  Duloxetine  Gabapentin  Tramadol  **Note: The testing scope of this panel does not include small to  moderate amounts of these reported medications:  Aspirin  **Note: The testing scope of this panel does not include the  following reported medications:  Albuterol  Amlodipine  Chondroitin  Cyanocobalamin  Docusate  Dulaglutide  Fluticasone (Flonase)  Glucosamine  Insulin  Isosorbide (Imdur)  Multivitamin  Omeprazole  Ondansetron  Sodium Bicarbonate  Supplement  Topical  Torsemide  Vitamin C ==================================================================== For clinical consultation, please call [phone]. ====================================================================    Laboratory Chemistry  Profile (12 mo)  Renal: 05/28/2019: BUN 66; Creatinine, Ser 3.25  Lab Results  Component Value Date   GFRAA 17 (L) 05/28/2019   GFRNONAA 15 (L) 05/28/2019   Hepatic: 05/28/2019: Albumin 4.0 Lab Results  Component Value Date   AST 27 05/28/2019   ALT 23 05/28/2019   Other: 12/26/2018: CRP 29.7; Sed Rate 102 Note: Above Lab results reviewed.  Imaging  VAS [name] ABI WITH/WO TBI LOWER EXTREMITY DOPPLER STUDY  Indications: Rest pain.   Performing Technologist: Rachel Holmes    Examination Guidelines: A complete evaluation includes at minimum, Doppler waveform signals and systolic blood pressure reading at the level of bilateral brachial, anterior tibial, and posterior tibial arteries, when vessel segments are accessible. Bilateral testing is considered an integral part of a complete examination. Photoelectric Plethysmograph (PPG) waveforms and toe systolic pressure readings are included as required and additional duplex testing as needed. Limited examinations for reoccurring indications may be performed as noted.    ABI  Findings: +---------+------------------+-----+----------+--------+ Right    Rt Pressure (mmHg)IndexWaveform  Comment  +---------+------------------+-----+----------+--------+ Brachial 188                                       +---------+------------------+-----+----------+--------+ ATA      100               0.53 monophasic         +---------+------------------+-----+----------+--------+ PTA      94                0.50 monophasic         +---------+------------------+-----+----------+--------+ Great Toe107               0.57                    +---------+------------------+-----+----------+--------+  +---------+------------------+-----+--------+-------+ Left     Lt Pressure (mmHg)IndexWaveformComment +---------+------------------+-----+--------+-------+ Brachial 188                                    +---------+------------------+-----+--------+-------+ ATA      174               0.93 biphasic        +---------+------------------+-----+--------+-------+ PERO     181               0.96 biphasic        +---------+------------------+-----+--------+-------+ Great Toe100               0.53                 +---------+------------------+-----+--------+-------+  +-------+-----------+-----------+------------+------------+ ABI/TBIToday's ABIToday's TBIPrevious ABIPrevious TBI +-------+-----------+-----------+------------+------------+ Right  .53        .57                                 +-------+-----------+-----------+------------+------------+ Left   .96        .53                                 +-------+-----------+-----------+------------+------------+    Summary: Right: Resting right ankle-brachial index indicates moderate right lower extremity arterial disease. The right toe-brachial index is abnormal.  Left: Resting left ankle-brachial index is within normal range. No  evidence of significant left lower  extremity arterial disease. The left toe-brachial index is abnormal.    *See table(s) above for measurements and observations.    Electronically signed by Rachel Pilar MD on 04/15/2019 at 5:31:08 PM.    Final     Assessment  The primary encounter diagnosis was Primary osteoarthritis of right knee. Diagnoses of Chronic pain of right knee, Hx of BKA, left ([place]), PVD (peripheral vascular disease) ([place]), Stage 4 chronic kidney disease ([place]), and Chronic pain syndrome were also pertinent to this visit.  Plan of Care  Problem-specific:  Primary osteoarthritis of right knee Orders Placed This Encounter  Procedures  . Genicular NB (Schedule)    Indication(s):  Sub-acute knee pain    Standing Status:   Future    Standing Expiration Date:   11/17/2019    Scheduling Instructions:     Side: RIGHT     Sedation: WITHOUT     Timeframe: As soon as schedule allows    Order Specific Question:   Where will this procedure be performed?    Answer:   [facility]   I am having Rachel Holmes maintain her amLODipine, cloNIDine, gabapentin, multivitamin with minerals, omeprazole, albuterol, ONE TOUCH ULTRA 2, onetouch ultrasoft, DULoxetine, sodium bicarbonate, Insulin Pen Needle, glucose blood, clobetasol cream, vitamin B-12, fluticasone, ondansetron, torsemide, isosorbide mononitrate, glucosamine-chondroitin, aspirin, docusate sodium, Ensure Max Protein, ascorbic acid, FreeStyle Libre 14 Day Sensor, [employer] 14 Day Reader, NovoLOG FlexPen, Trulicity, traMADol, and olmesartan.  Pharmacotherapy (Medications Ordered): No orders of the defined types were placed in this encounter.  Orders:  Orders Placed This Encounter  Procedures  . Genicular NB (Schedule)    Indication(s):  Sub-acute knee pain    Standing Status:   Future    Standing Expiration Date:   11/17/2019    Scheduling Instructions:     Side: RIGHT     Sedation: WITHOUT     Timeframe: As soon as schedule allows    Order  Specific Question:   Where will this procedure be performed?    Answer:   [facility]   Follow-up plan:   Return in about 4 weeks (around 11/15/2019) for Procedure R GNB , without sedation.     s/p R knee hyalgan 08/30/2019 and 09/13/2019, R knee steroid injection on 09/29/2019- not helpful, plan for R knee GNB and possibe RFA next   Recent Visits Date Type Provider Dept  09/29/19 Procedure visit Gillis Santa, MD [facility]  08/30/19 Procedure visit Gillis Santa, MD [facility]  08/02/19 Procedure visit Gillis Santa, MD [facility]  Showing recent visits within past 90 days and meeting all other requirements   Today's Visits Date Type Provider Dept  10/18/19 Office Visit Gillis Santa, MD [facility]  Showing today's visits and meeting all other requirements   Future Appointments No visits were found meeting these conditions.  Showing future appointments within next 90 days and meeting all other requirements   I discussed the assessment and treatment plan with the patient. The patient was provided an opportunity to ask questions and all were answered. The patient agreed with the plan and demonstrated an understanding of the instructions.  Patient advised to call back or seek an in-person evaluation if the symptoms or condition worsens.  Total duration of non-face-to-face encounter: 25 minutes.  Note by: Gillis Santa, MD Date: 10/18/2019; Time: 5:08 PM  Note: This dictation was prepared with Dragon dictation. Any transcriptional errors that may result  from this process are unintentional.

## 2019-10-19 ENCOUNTER — Other Ambulatory Visit: Payer: Self-pay

## 2019-10-20 ENCOUNTER — Ambulatory Visit: Payer: Medicare HMO | Admitting: Student in an Organized Health Care Education/Training Program

## 2019-10-20 ENCOUNTER — Ambulatory Visit: Payer: Medicare HMO

## 2019-10-21 ENCOUNTER — Ambulatory Visit (INDEPENDENT_AMBULATORY_CARE_PROVIDER_SITE_OTHER): Payer: Medicare HMO | Admitting: Endocrinology

## 2019-10-21 ENCOUNTER — Other Ambulatory Visit: Payer: Self-pay

## 2019-10-21 ENCOUNTER — Encounter: Payer: Self-pay | Admitting: Endocrinology

## 2019-10-21 VITALS — BP 130/70 | HR 82 | Ht 68.0 in | Wt 302.8 lb

## 2019-10-21 DIAGNOSIS — IMO0002 Reserved for concepts with insufficient information to code with codable children: Secondary | ICD-10-CM

## 2019-10-21 DIAGNOSIS — E1165 Type 2 diabetes mellitus with hyperglycemia: Secondary | ICD-10-CM

## 2019-10-21 DIAGNOSIS — E1142 Type 2 diabetes mellitus with diabetic polyneuropathy: Secondary | ICD-10-CM

## 2019-10-21 LAB — POCT GLYCOSYLATED HEMOGLOBIN (HGB A1C): Hemoglobin A1C: 8.2 % — AB (ref 4.0–5.6)

## 2019-10-21 MED ORDER — TRULICITY 3 MG/0.5ML ~~LOC~~ SOAJ: 3.0000 mg | SUBCUTANEOUS | 3 refills | Status: DC

## 2019-10-21 MED ORDER — NOVOLOG FLEXPEN 100 UNIT/ML ~~LOC~~ SOPN: PEN_INJECTOR | SUBCUTANEOUS | 3 refills | Status: DC

## 2019-10-21 NOTE — Progress Notes (Signed)
Subjective:    Patient ID: Rachel Holmes, female    DOB: [birth year], 58 y.o.   MRN: [id number]  HPI Pt returns for f/u of diabetes mellitus: DM type: Insulin-requiring type 2 Dx'ed: 1988, during a pregnancy, but it persisted after Complications: polyneuropathy, renal failure, leg ulcer, left BKA, PAD, and PDR.   Therapy: insulin since 4287, and trulicity DKA: never Severe hypoglycemia: 2 episodes (both many years ago).   Pancreatitis: never Pancreatic imaging: normal on 2008 [name].   Other: a trial off insulin in early 2020 was unsuccessful; renal failure limits rx options; she does not need basal insulin.  Interval history: she had a steroid injection into the right knee, 3 weeks ago.  This increased cbg's to over 400.  no cbg record, but pt states (other than the week after steroid injection), cbg's vary from 85-200.  It is in general lowest at lunch, and highest in the afternoon.  She says she never misses the insulin.     Past Medical History:  Diagnosis Date  . Allergic rhinitis   . Allergy   . Anemia   . Anxiety   . Arthritis   . Back pain   . Bradycardia   . Breast mass    Patient can no longer palpate specific masses but showed tech general area of concern  . CHF (congestive heart failure) ([place])   . CKD (chronic kidney disease)    STAGE 3  . Constipation   . Diabetes mellitus without complication ([place])   . Diabetic nephropathy ([place])   . Dyspnea   . GERD (gastroesophageal reflux disease)   . Heart murmur   . HLD (hyperlipidemia)   . Hyperparathyroidism ([place])   . Hypertension   . Joint pain   . Leg edema   . Legally blind in left eye, as defined in [place]   . Lymphedema   . Obesity   . Onychomycosis     Past Surgical History:  Procedure Laterality Date  . ABDOMINAL HYSTERECTOMY    . AMPUTATION Left 05/05/2019   Procedure: AMPUTATION BELOW KNEE;  Surgeon: Katha Cabal, MD;  Location: [facility];  Service: Vascular;  Laterality: Left;  . BREAST BIOPSY Left  2014   FNA 12:00 position - Negative  . EYE SURGERY Left 2007   removed a lens, no lens implanted  . IR FLUORO GUIDE CV LINE RIGHT  12/29/2018  . IR REMOVAL TUN CV CATH W/O FL  02/19/2019  . IR US GUIDE VASC ACCESS RIGHT  12/29/2018  . TEE WITHOUT CARDIOVERSION N/A 12/28/2018   Procedure: TRANSESOPHAGEAL ECHOCARDIOGRAM (TEE);  Surgeon: Sueanne Margarita, MD;  Location: [facility];  Service: Cardiovascular;  Laterality: N/A;  . VAGINAL HYSTERECTOMY     abdominal hyst, not a vaginal hyst    Social History   Socioeconomic History  . Marital status: Divorced    Spouse name: Not on file  . Number of children: 1  . Years of education: Not on file  . Highest education level: Not on file  Occupational History  . Occupation: Glass blower/designer    Comment: disability  Tobacco Use  . Smoking status: Never Smoker  . Smokeless tobacco: Never Used  Substance and Sexual Activity  . Alcohol use: No  . Drug use: Never  . Sexual activity: Not on file  Other Topics Concern  . Not on file  Social History Narrative   ** Merged History Encounter **    on disability   Social Determinants of Health  Financial Resource Strain:   . Difficulty of Paying Living Expenses: Not on file  Food Insecurity:   . Worried About Charity fundraiser in the Last Year: Not on file  . Ran Out of Food in the Last Year: Not on file  Transportation Needs:   . Lack of Transportation (Medical): Not on file  . Lack of Transportation (Non-Medical): Not on file  Physical Activity:   . Days of Exercise per Week: Not on file  . Minutes of Exercise per Session: Not on file  Stress:   . Feeling of Stress : Not on file  Social Connections:   . Frequency of Communication with Friends and Family: Not on file  . Frequency of Social Gatherings with Friends and Family: Not on file  . Attends Religious Services: Not on file  . Active Member of Clubs or Organizations: Not on file  . Attends Archivist Meetings: Not  on file  . Marital Status: Not on file  Intimate Partner Violence:   . Fear of Current or Ex-Partner: Not on file  . Emotionally Abused: Not on file  . Physically Abused: Not on file  . Sexually Abused: Not on file    Current Outpatient Medications on File Prior to Visit  Medication Sig Dispense Refill  . albuterol (PROVENTIL HFA;VENTOLIN HFA) 108 (90 Base) MCG/ACT inhaler Inhale 1-2 puffs into the lungs every 6 (six) hours as needed for wheezing or shortness of breath.    [name] amLODipine (NORVASC) 10 MG tablet Take 10 mg by mouth daily.    [name] aspirin EC 81 MG EC tablet Take 1 tablet (81 mg total) by mouth daily.    . Blood Glucose Monitoring Suppl (ONE TOUCH ULTRA 2) w/Device KIT 1 Device by Does not apply route daily. 1 each 0  . clobetasol cream (TEMOVATE) 9.62 % Apply 1 application topically 2 (two) times daily.    . cloNIDine (CATAPRES) 0.3 MG tablet Take 0.3 mg by mouth 2 (two) times daily.    . Continuous Blood Gluc Receiver (FREESTYLE LIBRE 14 DAY READER) DEVI 1 each by Does not apply route See admin instructions. For continuous glucose monitoring; E11.42 1 each 0  . Continuous Blood Gluc Sensor (FREESTYLE LIBRE 14 DAY SENSOR) MISC 1 Device by Does not apply route every 14 (fourteen) days. 6 each 3  . docusate sodium (COLACE) 100 MG capsule Take 1 capsule (100 mg total) by mouth 2 (two) times daily. 10 capsule 0  . DULoxetine (CYMBALTA) 30 MG capsule Take 30 mg by mouth daily.     . Ensure Max Protein (ENSURE MAX PROTEIN) LIQD Take 330 mLs (11 oz total) by mouth 2 (two) times daily.    . fluticasone (FLONASE) 50 MCG/ACT nasal spray Place 1 spray into both nostrils daily.     [name] gabapentin (NEURONTIN) 600 MG tablet Take 600 mg by mouth at bedtime.    [name] glucosamine-chondroitin 500-400 MG tablet Take 1 tablet by mouth 2 (two) times daily.    [name] glucose blood (ONETOUCH VERIO) test strip 1 each by Other route 2 (two) times a day. Use to monitor glucose levels BID; E11.42 100 each 3  . Insulin  Pen Needle (BD PEN NEEDLE MICRO U/F) 32G X 6 MM MISC 1 each by Does not apply route 3 (three) times daily. 100 each 3  . isosorbide mononitrate (IMDUR) 30 MG 24 hr tablet Take 1 tablet (30 mg total) by mouth 2 (two) times daily. 180 tablet 3  . Lancets (  ONETOUCH ULTRASOFT) lancets Used to check blood sugars four times daily. 200 each 12  . Multiple Vitamin (MULTIVITAMIN WITH MINERALS) TABS tablet Take 1 tablet by mouth daily.    [name] olmesartan (BENICAR) 20 MG tablet Take 40 mg by mouth daily. Pt is taking 6m daily    . omeprazole (PRILOSEC) 40 MG capsule Take 40 mg by mouth 2 (two) times daily.    . ondansetron (ZOFRAN) 4 MG tablet Take 4 mg by mouth every 8 (eight) hours as needed for nausea or vomiting.    . sodium bicarbonate 650 MG tablet Take 1 tablet (650 mg total) by mouth 2 (two) times daily. 60 tablet 0  . torsemide (DEMADEX) 20 MG tablet Take 1 tablet (20 mg total) by mouth 2 (two) times daily. Take an extra 210mtablet daily prn in the morning for swelling or sob.    . traMADol (ULTRAM) 50 MG tablet One to Two Tabs Every Six Hours As Needed For Pain 40 tablet 0  . vitamin B-12 (CYANOCOBALAMIN) 1000 MCG tablet Take 1,000 mcg by mouth daily.    . vitamin C (VITAMIN C) 250 MG tablet Take 1 tablet (250 mg total) by mouth 2 (two) times daily.     No current facility-administered medications on file prior to visit.    Allergies  Allergen Reactions  . Statins Shortness Of Breath and Other (See Comments)    Wheezing, short of breath  . Penicillins Hives and Other (See Comments)    Has patient had a PCN reaction causing immediate rash, facial/tongue/throat swelling, SOB or lightheadedness with hypotension: No Has patient had a PCN reaction causing severe rash involving mucus membranes or skin necrosis: No Has patient had a PCN reaction that required hospitalization No Has patient had a PCN reaction occurring within the last 10 years: No If all of the above answers are "NO", then may  proceed with Cephalosporin use.  . Codeine Nausea Only  . Ibuprofen Other (See Comments)    Reaction:  Raises pts BP  . Percocet [Oxycodone-Acetaminophen] Nausea Only  . Tramadol Nausea Only and Other (See Comments)    Can take if she has eaten. Currently takes with good results   . Vicodin [Hydrocodone-Acetaminophen] Itching and Nausea Only    Family History  Problem Relation Age of Onset  . Breast cancer Sister 5839. Diabetes Sister   . Diabetes Mother   . Hypertension Mother   . Hyperlipidemia Mother   . Eating disorder Mother   . Obesity Mother     BP 130/70 (BP Location: Left Arm, Patient Position: Sitting, Cuff Size: Large)   Pulse 82   Ht _0  (1.727 m)   Wt (!) 302 lb 12.8 oz (137.3 kg)   SpO2 94%   BMI 46.04 kg/m    Review of Systems She denies hypoglycemia    Objective:   Physical Exam VITAL SIGNS:  See vs page GENERAL: no distress.  In wheelchair Pulses: right dorsalis pedis is intact. MSK: no deformity of the right foot CV: trace right leg edema.   Skin:  no ulcer on the right foot.  normal color and temp on the right foot.   Neuro: sensation is intact to touch on the right foot.   Ext: there is onychomycosis of the right foot toenails.  Left BKA.     Lab Results  Component Value Date   HGBA1C 8.2 (A) 10/21/2019       Assessment & Plan:  Insulin-requiring type 2 DM, with  PAD: Worse Renal failure: This limits rx options  Patient Instructions  I have sent a prescription to your pharmacy, to increase the Trulicity. Please reduce the Novolog to 3 times a day (just before each meal), 10-22-16 units.  check your blood sugar twice a day.  vary the time of day when you check, between before the 3 meals, and at bedtime.  also check if you have symptoms of your blood sugar being too high or too low.  please keep a record of the readings and bring it to your next appointment here (or you can bring the meter itself).  You can write it on any piece of  paper.  please call us sooner if your blood sugar goes below 70, or if you have a lot of readings over 200.  Please come back for a follow-up appointment in 2 months.

## 2019-10-21 NOTE — Patient Instructions (Addendum)
I have sent a prescription to your pharmacy, to increase the Trulicity. Please reduce the Novolog to 3 times a day (just before each meal), 10-22-16 units.  check your blood sugar twice a day.  vary the time of day when you check, between before the 3 meals, and at bedtime.  also check if you have symptoms of your blood sugar being too high or too low.  please keep a record of the readings and bring it to your next appointment here (or you can bring the meter itself).  You can write it on any piece of paper.  please call us sooner if your blood sugar goes below 70, or if you have a lot of readings over 200.  Please come back for a follow-up appointment in 2 months.

## 2019-10-25 ENCOUNTER — Ambulatory Visit: Payer: Medicare HMO

## 2019-10-25 ENCOUNTER — Other Ambulatory Visit: Payer: Self-pay

## 2019-10-25 DIAGNOSIS — M6281 Muscle weakness (generalized): Secondary | ICD-10-CM

## 2019-10-25 DIAGNOSIS — R2689 Other abnormalities of gait and mobility: Secondary | ICD-10-CM

## 2019-10-25 DIAGNOSIS — R2681 Unsteadiness on feet: Secondary | ICD-10-CM

## 2019-10-25 NOTE — Therapy (Signed)
[place] MAIN [facility] SERVICES [address] [place], [place], [address] Phone: [phone]   Fax:  [phone]  Physical Therapy Treatment  Patient Details  Name: Rachel Holmes MRN: [id number] Date of Birth: [birth year] Referring Provider (PT): Eulogio Ditch    Encounter Date: 10/25/2019  PT End of Session - 10/25/19 0935    Visit Number  14    Number of Visits  28    Date for PT Re-Evaluation  11/29/19    Authorization Type  4/10 PN 11/23    PT Start Time  0929    PT Stop Time  1014    PT Time Calculation (min)  45 min    Equipment Utilized During Treatment  Gait belt;Other (comment)   L prosthesis   Activity Tolerance  Patient tolerated treatment well;Patient limited by fatigue    Behavior During Therapy  [facility] for tasks assessed/performed       Past Medical History:  Diagnosis Date  . Allergic rhinitis   . Allergy   . Anemia   . Anxiety   . Arthritis   . Back pain   . Bradycardia   . Breast mass    Patient can no longer palpate specific masses but showed tech general area of concern  . CHF (congestive heart failure) ([place])   . CKD (chronic kidney disease)    STAGE 3  . Constipation   . Diabetes mellitus without complication ([place])   . Diabetic nephropathy ([place])   . Dyspnea   . GERD (gastroesophageal reflux disease)   . Heart murmur   . HLD (hyperlipidemia)   . Hyperparathyroidism ([place])   . Hypertension   . Joint pain   . Leg edema   . Legally blind in left eye, as defined in [place]   . Lymphedema   . Obesity   . Onychomycosis     Past Surgical History:  Procedure Laterality Date  . ABDOMINAL HYSTERECTOMY    . AMPUTATION Left 05/05/2019   Procedure: AMPUTATION BELOW KNEE;  Surgeon: Katha Cabal, MD;  Location: [facility] ORS;  Service: Vascular;  Laterality: Left;  . BREAST BIOPSY Left 2014   FNA 12:00 position - Negative  . EYE SURGERY Left 2007   removed a lens, no lens implanted  . IR FLUORO GUIDE CV LINE RIGHT   12/29/2018  . IR REMOVAL TUN CV CATH W/O FL  02/19/2019  . IR US GUIDE VASC ACCESS RIGHT  12/29/2018  . TEE WITHOUT CARDIOVERSION N/A 12/28/2018   Procedure: TRANSESOPHAGEAL ECHOCARDIOGRAM (TEE);  Surgeon: Sueanne Margarita, MD;  Location: [facility] ENDOSCOPY;  Service: Cardiovascular;  Laterality: N/A;  . VAGINAL HYSTERECTOMY     abdominal hyst, not a vaginal hyst    There were no vitals filed for this visit.  Subjective Assessment - 10/25/19 0931    Subjective  Patient reports she had a couple doctor visits since her last visits. Missed last week due to pain and bad weather. Will get knee block nerve block in january.    Pertinent History  Patient is a pleasant 58 year old female who presents for gait training/ stability for L BKA. Recently obtained her prosthesis last Friday 08/06/19 and has not worn it since the prosthetists office. PMH includes anemia, anxiety, arthritis, back pain, bradycardia, CHF, CKD stage II, DM, diabetic nephropathy, dyspnea, GERD, HLD, hyperparathyroidism, hypotension, legally blind in left eye, lymphedema, obesity, onychomycosis. Left BKA performed 05/05/19, noted in history that patient had a fall after procedure injuring the  anterior portion of the suture line. Currently using a manual chair to get around the home. At this time she can't get into bathroom due to wheelchair, is waiting for a handicap apartment.    Limitations  Lifting;Standing;Walking;House hold activities    How long can you sit comfortably?  n/a    How long can you stand comfortably?  only stood once with prosthetist    How long can you walk comfortably?  not able to walk yet.    Patient Stated Goals  to walk again with prosthesis    Currently in Pain?  Yes    Pain Score  5     Pain Location  Knee    Pain Orientation  Right    Pain Descriptors / Indicators  Aching    Pain Type  Chronic pain    Pain Onset  More than a month ago    Pain Frequency  Intermittent           Vitals: 137/66 pulse 66    Ambulating with decreased UE support in // bars. Able to ambulate with LUE support due to R knee pain best, requires cueing for L hand, R foot L foot sequencing.12x length of // bars. Frequent seated rest breaks due to R knee pain. Use of 5lb ankle weight on R foot for reduction of pain. CGA/ min A for weight shift and placement of LE's and UE for neutralizing body mechanics for optimal muscle recruitment and stability.  ambulate with quad cane in LUE with decreasing support from // bars with RUE; decreased to no UE support. x18 lengths of // bars; max cueing for body mechanics, sequencing with AD for stability, and safety ; patient more challenged this session  Quad cane: standing marches in // bars: 10x each LE (added to HEP)  Quad cane: horizontal head turns with static standing 60 seconds (added to HEP)   side step 2x length of // bars with BUE support  Standing hip extension 12x each LE BUE support ; very challenging for stabilizing on RLE due to knee discomfort.   Seated hamstring stretch with foot on 4" step, 60 second holds, x2 trials each LE, cueing to hold not "pulse"    Seated 5lb ankle weights: -marches with upright posture x12 each Le, cuieng for higher amplitude and upright posture.  -LAQ 12x each LE; 2 sets -ER/IR 15 Patient requires seated rest breaks throughout standing intervention due to pain in R knee with weightbearing.    Patient more challenged with sequencing of use of quad cane with ambulation than previous sessions. Patient has had a week long break from therapy due to various reasons. Patient continues to be motivated to progress for functional mobility. Patient will benefit from skilled physical therapy for ambulation and transfers with prosthesis, strength and stability, and increase stability with functional mobility for quality of life and decreased falls risk             PT Education - 10/25/19 0935    Education Details  exercise technique,  gait mechanics with quad cane    Person(s) Educated  Patient    Methods  Explanation;Demonstration;Tactile cues;Verbal cues    Comprehension  Verbalized understanding;Returned demonstration;Verbal cues required;Tactile cues required       PT Short Term Goals - 09/27/19 1023      PT SHORT TERM GOAL #1   Title  Patient will be independent in home exercise program to improve strength/mobility for better functional independence with ADLs.  Baseline  10/7: HEP given; 11/23: HEP compliant    Time  2    Period  Weeks    Status  Partially Met    Target Date  08/25/19        PT Long Term Goals - 10/04/19 1017      PT LONG TERM GOAL #1   Title  Patient will tolerate static standing for > 5 minutes for standing ADL performance with weight acceptance onto prosthetic limb to improve quality of life and mobility.    Baseline  10/7: 1 minute 39 seconds with heavy UE support on RW 11/23: 1 minute 50 seconds static stand terminated due to back pain not knees. , 3 minutes of walking/dynamic standing.    Time  8    Period  Weeks    Status  Partially Met    Target Date  11/29/19      PT LONG TERM GOAL #2   Title  Patient will ambulate >100 feet with RW, mod I with right prosthetic exhibiting reciprocal gait pattern with good  management of prosthetic and good safety awareness for improved home ambulation    Baseline  10/7: unable to ambulate 11/23: 86 ft with RW    Time  8    Period  Weeks    Status  Partially Met    Target Date  11/29/19      PT LONG TERM GOAL #3   Title  Patient (< 70 years old) will complete five times sit to stand test in < 10 seconds indicating an increased LE strength and improved balance.    Baseline  10/7: 21 seconds with heavy BUE support from raised surface 11/23: 16 seconds with BUE support and RW    Time  8    Period  Weeks    Status  Partially Met    Target Date  11/29/19      PT LONG TERM GOAL #4   Title  Patient will increase BLE gross strength to 4+/5 as  to improve functional strength for independent gait, increased standing tolerance and increased ADL ability.    Baseline  10/7: L 3+/5 R 4-/5 11/23: L 4-/5 R 4/5 with knee 4-/5 due to pain    Time  8    Period  Weeks    Status  Partially Met    Target Date  11/29/19            Plan - 10/25/19 0174    Clinical Impression Statement  Patient more challenged with sequencing of use of quad cane with ambulation than previous sessions. Patient has had a week long break from therapy due to various reasons. Patient continues to be motivated to progress for functional mobility. Patient will benefit from skilled physical therapy for ambulation and transfers with prosthesis, strength and stability, and increase stability with functional mobility for quality of life and decreased falls risk    Personal Factors and Comorbidities  Age;Comorbidity 3+;Education;Finances;Fitness;Past/Current Experience;Social Background;Time since onset of injury/illness/exacerbation;Other    Comorbidities  anemia, anxiety, arthritis, back pain, bradycardia, CHF, CKD stage III, DM, diabetic nephropathy, dyspnea, GERD, heart murmer, HLD, hyperparathyroidism, HTN, blind in L eye, obesity, oncychomycosis    Examination-Activity Limitations  Bathing;Bed Mobility;Caring for Others;Bend;Carry;Dressing;Continence;Hygiene/Grooming;Stairs;Squat;Reach Overhead;Locomotion Level;Lift;Stand;Toileting;Transfers    Examination-Participation Restrictions  Church;Cleaning;Community Activity;Interpersonal Relationship;Driving;Laundry;Volunteer;Shop;Personal Finances;Meal Prep;Yard Work;Other    Stability/Clinical Decision Making  Evolving/Moderate complexity    Rehab Potential  Fair    PT Frequency  2x / week    PT Duration  8  weeks    PT Treatment/Interventions  ADLs/Self Care Home Management;Aquatic Therapy;Biofeedback;Cryotherapy;Electrical Stimulation;Iontophoresis 4m/ml Dexamethasone;Moist Heat;Traction;Ultrasound;Therapeutic  activities;Functional mobility training;Stair training;Gait training;DME Instruction;Therapeutic exercise;Balance training;Neuromuscular re-education;Patient/family education;Manual techniques;Wheelchair mTax adviserCompression bandaging;Scar mobilization;Passive range of motion;Dry needling;Energy conservation;Splinting;Taping    PT Next Visit Plan  ambulate with RW    PT Home Exercise Plan  prone 10 minutes a day, wear prosthesis 1 hour a day, side lie posterior kick backs 10x    Consulted and Agree with Plan of Care  Patient       Patient will benefit from skilled therapeutic intervention in order to improve the following deficits and impairments:  Abnormal gait, Cardiopulmonary status limiting activity, Decreased activity tolerance, Decreased balance, Decreased knowledge of precautions, Decreased endurance, Decreased coordination, Decreased knowledge of use of DME, Decreased mobility, Decreased range of motion, Decreased safety awareness, Difficulty walking, Decreased strength, Hypomobility, Impaired flexibility, Impaired perceived functional ability, Impaired sensation, Obesity, Prosthetic Dependency, Postural dysfunction, Improper body mechanics, Pain  Visit Diagnosis: Other abnormalities of gait and mobility  Muscle weakness (generalized)  Unsteadiness on feet     Problem List Patient Active Problem List   Diagnosis Date Noted  . Chronic pain syndrome 07/13/2019  . Wound dehiscence 05/29/2019  . Hx of BKA, left ([place] 05/24/2019  . PVD (peripheral vascular disease) ([place] 05/13/2019  . Chronic osteomyelitis of ankle and foot, left ([place] 05/05/2019  . Chronic diastolic CHF (congestive heart failure) ([place] 04/19/2019  . Leg edema 04/19/2019  . Acute bacterial endocarditis 04/19/2019  . Chest pain of uncertain etiology 011/94/1740 . Ulcer of left ankle ([place] 04/14/2019  . Ankle deformity, left 01/21/2019  . Diabetic polyneuropathy associated with type 2  diabetes mellitus ([place] 01/21/2019  . Aortic valve endocarditis   . Bacteremia due to Streptococcus 12/24/2018  . Diabetic ulcer of left ankle associated with diabetes mellitus due to underlying condition, with fat layer exposed ([place] 11/09/2018  . Malignant hypertension (arteriolar nephrosclerosis), stage 1-4 or unspecified chronic kidney disease 07/15/2018  . Stage 4 chronic kidney disease ([place] 07/15/2018  . Morbid obesity with BMI of 45.0-49.9, adult ([place] 07/15/2018  . Excessive daytime sleepiness 07/15/2018  . Other fatigue 03/11/2018  . Essential hypertension 03/11/2018  . Vitamin D deficiency 03/11/2018  . Congestive heart failure ([place] 03/11/2018  . Primary osteoarthritis of right knee 01/06/2018  . Uncontrolled type 2 diabetes mellitus with polyneuropathy ([place] 10/03/2017  . Chronic pain of right knee 10/02/2017  . Obesity 10/02/2017  . Charcot's joint of left foot 08/29/2017  . Lymphedema 09/06/2016  . Hyponatremia with extracellular fluid depletion 02/02/2016  . Nausea with vomiting, unspecified 02/02/2016  . Closed nondisplaced fracture of right patella 08/23/2015  . Acute cystitis without hematuria 08/14/2014  . Colitis 08/14/2014  . HTN (hypertension), malignant 08/14/2014  . Acute renal failure superimposed on stage 3 chronic kidney disease ([place] 06/26/2014  . Hyperparathyroidism, unspecified ([place] 02/24/2014  . Allergic rhinitis 02/22/2014  . Onychomycosis 02/22/2014  . Anxiety 11/05/2013  . Bradycardia 05/14/2013  . Gastro-esophageal reflux disease without esophagitis 04/05/2013  . Anemia in other chronic diseases classified elsewhere 01/27/2013   MJanna Arch PT, DPT   10/25/2019, 10:14 AM  [place] [facility] CenterSERVICES [address] [place] [address]Phone: [phone]  Fax:  [phone] Name: Rachel BAIMRN: [id number] of Birth: [birth year]

## 2019-10-27 ENCOUNTER — Other Ambulatory Visit: Payer: Self-pay

## 2019-10-27 ENCOUNTER — Encounter: Payer: Self-pay | Admitting: Physical Therapy

## 2019-10-27 ENCOUNTER — Ambulatory Visit: Payer: Medicare HMO | Admitting: Physical Therapy

## 2019-10-27 DIAGNOSIS — R2681 Unsteadiness on feet: Secondary | ICD-10-CM

## 2019-10-27 DIAGNOSIS — M6281 Muscle weakness (generalized): Secondary | ICD-10-CM

## 2019-10-27 DIAGNOSIS — R2689 Other abnormalities of gait and mobility: Secondary | ICD-10-CM | POA: Diagnosis not present

## 2019-10-27 NOTE — Therapy (Signed)
[place] [facility] [address] [place], [place], [address] Phone: [phone]   Fax:  [phone]  Physical Therapy Treatment  Patient Details  Name: Rachel Holmes MRN: [id number] Date of Birth: 1961/08/23 Referring Provider (PT): Eulogio Ditch    Encounter Date: 10/27/2019  PT End of Session - 10/27/19 0937    Visit Number  15    Number of Visits  28    Date for PT Re-Evaluation  11/29/19    Authorization Type  4/10 PN 11/23    PT Start Time  0930    PT Stop Time  1010    PT Time Calculation (min)  40 min    Equipment Utilized During Treatment  Gait belt;Other (comment)   L prosthesis   Activity Tolerance  Patient tolerated treatment well;Patient limited by fatigue    Behavior During Therapy  [facility] for tasks assessed/performed       Past Medical History:  Diagnosis Date  . Allergic rhinitis   . Allergy   . Anemia   . Anxiety   . Arthritis   . Back pain   . Bradycardia   . Breast mass    Patient can no longer palpate specific masses but showed tech general area of concern  . CHF (congestive heart failure) ([place])   . CKD (chronic kidney disease)    STAGE 3  . Constipation   . Diabetes mellitus without complication ([place])   . Diabetic nephropathy ([place])   . Dyspnea   . GERD (gastroesophageal reflux disease)   . Heart murmur   . HLD (hyperlipidemia)   . Hyperparathyroidism ([place])   . Hypertension   . Joint pain   . Leg edema   . Legally blind in left eye, as defined in [place]   . Lymphedema   . Obesity   . Onychomycosis     Past Surgical History:  Procedure Laterality Date  . ABDOMINAL HYSTERECTOMY    . AMPUTATION Left 05/05/2019   Procedure: AMPUTATION BELOW KNEE;  Surgeon: Katha Cabal, MD;  Location: [facility] ORS;  Service: Vascular;  Laterality: Left;  . BREAST BIOPSY Left 2014   FNA 12:00 position - Negative  . EYE SURGERY Left 2007   removed a lens, no lens implanted  . IR FLUORO GUIDE CV LINE RIGHT   12/29/2018  . IR REMOVAL TUN CV CATH W/O FL  02/19/2019  . IR US GUIDE VASC ACCESS RIGHT  12/29/2018  . TEE WITHOUT CARDIOVERSION N/A 12/28/2018   Procedure: TRANSESOPHAGEAL ECHOCARDIOGRAM (TEE);  Surgeon: Sueanne Margarita, MD;  Location: [facility] ENDOSCOPY;  Service: Cardiovascular;  Laterality: N/A;  . VAGINAL HYSTERECTOMY     abdominal hyst, not a vaginal hyst    There were no vitals filed for this visit.  Subjective Assessment - 10/27/19 0932    Subjective  Patient is having 6/10 right knee pain.    Patient is accompained by:  Family member   ex husband   Pertinent History  Patient is a pleasant 58 year old female who presents for gait training/ stability for L BKA. Recently obtained her prosthesis last Friday 08/06/19 and has not worn it since the prosthetists office. PMH includes anemia, anxiety, arthritis, back pain, bradycardia, CHF, CKD stage II, DM, diabetic nephropathy, dyspnea, GERD, HLD, hyperparathyroidism, hypotension, legally blind in left eye, lymphedema, obesity, onychomycosis. Left BKA performed 05/05/19, noted in history that patient had a fall after procedure injuring the anterior portion of the suture line. Currently using a  manual chair to get around the home. At this time she can't get into bathroom due to wheelchair, is waiting for a handicap apartment.    Limitations  Lifting;Standing;Walking;House hold activities    How long can you sit comfortably?  n/a    How long can you stand comfortably?  only stood once with prosthetist    How long can you walk comfortably?  not able to walk yet.    Patient Stated Goals  to walk again with prosthesis    Currently in Pain?  Yes    Pain Score  6     Pain Location  Knee    Pain Orientation  Right    Pain Descriptors / Indicators  Aching    Pain Type  Chronic pain    Pain Onset  More than a month ago    Pain Frequency  Constant    Aggravating Factors   weight bearing    Pain Relieving Factors  rest    Effect of Pain on Daily Activities   limits ambulation       Therapeutic exercise;   Therapeutic exercise:  Supine: SLR x 10 x 2  BLE Hookling marching x 15  Hooklying abd/ER x 15  Bridging x 10 x 2 SAQ x 10 x 2 with 5 sec counts  BLE, 4 lbs BLE Sidelying: Hip abd x 10 x 2  , BLE Standing marching with 4 lbs on RLE x 10  Gait training  In parallel bars with 4 lbs on RLE and left knee brace x 20 feet x 3 , with reports of 6/10 pain Side stepping in parallel bars with 4 lbs on RLE and left knee brace x 20 feet x 3    Pt educated throughout session about proper posture and technique with exercises. Improved exercise technique, movement at target joints, use of target muscles after min to mod verbal, visual, tactile cues.                        PT Education - 10/27/19 0934    Education Details  HEP    Person(s) Educated  Patient    Methods  Explanation;Demonstration;Tactile cues;Verbal cues    Comprehension  Verbalized understanding;Returned demonstration;Verbal cues required;Tactile cues required       PT Short Term Goals - 09/27/19 1023      PT SHORT TERM GOAL #1   Title  Patient will be independent in home exercise program to improve strength/mobility for better functional independence with ADLs.    Baseline  10/7: HEP given; 11/23: HEP compliant    Time  2    Period  Weeks    Status  Partially Met    Target Date  08/25/19        PT Long Term Goals - 10/04/19 1017      PT LONG TERM GOAL #1   Title  Patient will tolerate static standing for > 5 minutes for standing ADL performance with weight acceptance onto prosthetic limb to improve quality of life and mobility.    Baseline  10/7: 1 minute 39 seconds with heavy UE support on RW 11/23: 1 minute 50 seconds static stand terminated due to back pain not knees. , 3 minutes of walking/dynamic standing.    Time  8    Period  Weeks    Status  Partially Met    Target Date  11/29/19      PT LONG TERM GOAL #2   Title  Patient will ambulate  >100 feet with RW, mod I with right prosthetic exhibiting reciprocal gait pattern with good  management of prosthetic and good safety awareness for improved home ambulation    Baseline  10/7: unable to ambulate 11/23: 86 ft with RW    Time  8    Period  Weeks    Status  Partially Met    Target Date  11/29/19      PT LONG TERM GOAL #3   Title  Patient (< 64 years old) will complete five times sit to stand test in < 10 seconds indicating an increased LE strength and improved balance.    Baseline  10/7: 21 seconds with heavy BUE support from raised surface 11/23: 16 seconds with BUE support and RW    Time  8    Period  Weeks    Status  Partially Met    Target Date  11/29/19      PT LONG TERM GOAL #4   Title  Patient will increase BLE gross strength to 4+/5 as to improve functional strength for independent gait, increased standing tolerance and increased ADL ability.    Baseline  10/7: L 3+/5 R 4-/5 11/23: L 4-/5 R 4/5 with knee 4-/5 due to pain    Time  8    Period  Weeks    Status  Partially Met    Target Date  11/29/19            Plan - 10/27/19 3267    Clinical Impression Statement  Patient instructed in intermediate strengthening exercise and gait training. Patient fatigues quickly requiring short rest breaks in between intermediate exercise. Patient instructed in advanced strengthening with increased repetition/resistance.. Patient would benefit from additional skilled PT intervention to improve balance/gait safety and reduce fall risk.   Personal Factors and Comorbidities  Age;Comorbidity 3+;Education;Finances;Fitness;Past/Current Experience;Social Background;Time since onset of injury/illness/exacerbation;Other    Comorbidities  anemia, anxiety, arthritis, back pain, bradycardia, CHF, CKD stage III, DM, diabetic nephropathy, dyspnea, GERD, heart murmer, HLD, hyperparathyroidism, HTN, blind in L eye, obesity, oncychomycosis    Examination-Activity Limitations  Bathing;Bed  Mobility;Caring for Others;Bend;Carry;Dressing;Continence;Hygiene/Grooming;Stairs;Squat;Reach Overhead;Locomotion Level;Lift;Stand;Toileting;Transfers    Examination-Participation Restrictions  Church;Cleaning;Community Activity;Interpersonal Relationship;Driving;Laundry;Volunteer;Shop;Personal Finances;Meal Prep;Yard Work;Other    Stability/Clinical Decision Making  Evolving/Moderate complexity    Rehab Potential  Fair    PT Frequency  2x / week    PT Duration  8 weeks    PT Treatment/Interventions  ADLs/Self Care Home Management;Aquatic Therapy;Biofeedback;Cryotherapy;Electrical Stimulation;Iontophoresis 12m/ml Dexamethasone;Moist Heat;Traction;Ultrasound;Therapeutic activities;Functional mobility training;Stair training;Gait training;DME Instruction;Therapeutic exercise;Balance training;Neuromuscular re-education;Patient/family education;Manual techniques;Wheelchair mTax adviserCompression bandaging;Scar mobilization;Passive range of motion;Dry needling;Energy conservation;Splinting;Taping    PT Next Visit Plan  ambulate with RW    PT Home Exercise Plan  prone 10 minutes a day, wear prosthesis 1 hour a day, side lie posterior kick backs 10x    Consulted and Agree with Plan of Care  Patient       Patient will benefit from skilled therapeutic intervention in order to improve the following deficits and impairments:  Abnormal gait, Cardiopulmonary status limiting activity, Decreased activity tolerance, Decreased balance, Decreased knowledge of precautions, Decreased endurance, Decreased coordination, Decreased knowledge of use of DME, Decreased mobility, Decreased range of motion, Decreased safety awareness, Difficulty walking, Decreased strength, Hypomobility, Impaired flexibility, Impaired perceived functional ability, Impaired sensation, Obesity, Prosthetic Dependency, Postural dysfunction, Improper body mechanics, Pain  Visit Diagnosis: Other abnormalities of gait and  mobility  Muscle weakness (generalized)  Unsteadiness on feet     Problem List  Patient Active Problem List   Diagnosis Date Noted  . Chronic pain syndrome 07/13/2019  . Wound dehiscence 05/29/2019  . Hx of BKA, left ([place]) 05/24/2019  . PVD (peripheral vascular disease) ([place]) 05/13/2019  . Chronic osteomyelitis of ankle and foot, left ([place]) 05/05/2019  . Chronic diastolic CHF (congestive heart failure) ([place]) 04/19/2019  . Leg edema 04/19/2019  . Acute bacterial endocarditis 04/19/2019  . Chest pain of uncertain etiology 26/71/2458  . Ulcer of left ankle ([place]) 04/14/2019  . Ankle deformity, left 01/21/2019  . Diabetic polyneuropathy associated with type 2 diabetes mellitus ([place]) 01/21/2019  . Aortic valve endocarditis   . Bacteremia due to Streptococcus 12/24/2018  . Diabetic ulcer of left ankle associated with diabetes mellitus due to underlying condition, with fat layer exposed ([place]) 11/09/2018  . Malignant hypertension (arteriolar nephrosclerosis), stage 1-4 or unspecified chronic kidney disease 07/15/2018  . Stage 4 chronic kidney disease ([place]) 07/15/2018  . Morbid obesity with BMI of 45.0-49.9, adult ([place]) 07/15/2018  . Excessive daytime sleepiness 07/15/2018  . Other fatigue 03/11/2018  . Essential hypertension 03/11/2018  . Vitamin D deficiency 03/11/2018  . Congestive heart failure ([place]) 03/11/2018  . Primary osteoarthritis of right knee 01/06/2018  . Uncontrolled type 2 diabetes mellitus with polyneuropathy ([place]) 10/03/2017  . Chronic pain of right knee 10/02/2017  . Obesity 10/02/2017  . Charcot's joint of left foot 08/29/2017  . Lymphedema 09/06/2016  . Hyponatremia with extracellular fluid depletion 02/02/2016  . Nausea with vomiting, unspecified 02/02/2016  . Closed nondisplaced fracture of right patella 08/23/2015  . Acute cystitis without hematuria 08/14/2014  . Colitis 08/14/2014  . HTN (hypertension), malignant 08/14/2014  . Acute renal failure  superimposed on stage 3 chronic kidney disease ([place]) 06/26/2014  . Hyperparathyroidism, unspecified ([place]) 02/24/2014  . Allergic rhinitis 02/22/2014  . Onychomycosis 02/22/2014  . Anxiety 11/05/2013  . Bradycardia 05/14/2013  . Gastro-esophageal reflux disease without esophagitis 04/05/2013  . Anemia in other chronic diseases classified elsewhere 01/27/2013    Alanson Puls, PT DPT 10/27/2019, 9:45 AM  [place] [facility] [address] [place], [place], [address] Phone: [phone]   Fax:  [phone]  Name: Rachel Holmes MRN: [id number] Date of Birth: 1961/01/25

## 2019-11-02 ENCOUNTER — Ambulatory Visit: Payer: Medicare HMO

## 2019-11-02 ENCOUNTER — Other Ambulatory Visit: Payer: Self-pay

## 2019-11-02 DIAGNOSIS — R2689 Other abnormalities of gait and mobility: Secondary | ICD-10-CM | POA: Diagnosis not present

## 2019-11-02 DIAGNOSIS — R2681 Unsteadiness on feet: Secondary | ICD-10-CM

## 2019-11-02 DIAGNOSIS — M6281 Muscle weakness (generalized): Secondary | ICD-10-CM

## 2019-11-02 NOTE — Therapy (Signed)
[place] MAIN [facility] SERVICES [address] [place], [place], [address] Phone: [phone]   Fax:  [phone]  Physical Therapy Treatment  Patient Details  Name: Rachel Holmes MRN: [id number] Date of Birth: Aug 07, 1961 Referring Provider (PT): Eulogio Ditch    Encounter Date: 11/02/2019  PT End of Session - 11/02/19 1656    Visit Number  16    Number of Visits  28    Date for PT Re-Evaluation  11/29/19    Authorization Type  6/10 PN 11/23    PT Start Time  1600    PT Stop Time  1646    PT Time Calculation (min)  46 min    Equipment Utilized During Treatment  Gait belt;Other (comment)   L prosthesis   Activity Tolerance  Patient tolerated treatment well;Patient limited by fatigue    Behavior During Therapy  [facility] for tasks assessed/performed       Past Medical History:  Diagnosis Date  . Allergic rhinitis   . Allergy   . Anemia   . Anxiety   . Arthritis   . Back pain   . Bradycardia   . Breast mass    Patient can no longer palpate specific masses but showed tech general area of concern  . CHF (congestive heart failure) ([place])   . CKD (chronic kidney disease)    STAGE 3  . Constipation   . Diabetes mellitus without complication ([place])   . Diabetic nephropathy ([place])   . Dyspnea   . GERD (gastroesophageal reflux disease)   . Heart murmur   . HLD (hyperlipidemia)   . Hyperparathyroidism ([place])   . Hypertension   . Joint pain   . Leg edema   . Legally blind in left eye, as defined in [place]   . Lymphedema   . Obesity   . Onychomycosis     Past Surgical History:  Procedure Laterality Date  . ABDOMINAL HYSTERECTOMY    . AMPUTATION Left 05/05/2019   Procedure: AMPUTATION BELOW KNEE;  Surgeon: Katha Cabal, MD;  Location: [facility] ORS;  Service: Vascular;  Laterality: Left;  . BREAST BIOPSY Left 2014   FNA 12:00 position - Negative  . EYE SURGERY Left 2007   removed a lens, no lens implanted  . IR FLUORO GUIDE CV LINE RIGHT   12/29/2018  . IR REMOVAL TUN CV CATH W/O FL  02/19/2019  . IR US GUIDE VASC ACCESS RIGHT  12/29/2018  . TEE WITHOUT CARDIOVERSION N/A 12/28/2018   Procedure: TRANSESOPHAGEAL ECHOCARDIOGRAM (TEE);  Surgeon: Sueanne Margarita, MD;  Location: [facility] ENDOSCOPY;  Service: Cardiovascular;  Laterality: N/A;  . VAGINAL HYSTERECTOMY     abdominal hyst, not a vaginal hyst    There were no vitals filed for this visit.  Subjective Assessment - 11/02/19 1640    Subjective  Patient reports going to her doctor since last visit, is getting a nerve block for her RLE on the 4th. No falls or LOB since last session.    Patient is accompained by:  Family member   ex husband   Pertinent History  Patient is a pleasant 58 year old female who presents for gait training/ stability for L BKA. Recently obtained her prosthesis last Friday 08/06/19 and has not worn it since the prosthetists office. PMH includes anemia, anxiety, arthritis, back pain, bradycardia, CHF, CKD stage II, DM, diabetic nephropathy, dyspnea, GERD, HLD, hyperparathyroidism, hypotension, legally blind in left eye, lymphedema, obesity, onychomycosis. Left BKA performed 05/05/19, noted  in history that patient had a fall after procedure injuring the anterior portion of the suture line. Currently using a manual chair to get around the home. At this time she can't get into bathroom due to wheelchair, is waiting for a handicap apartment.    Limitations  Lifting;Standing;Walking;House hold activities    How long can you sit comfortably?  n/a    How long can you stand comfortably?  only stood once with prosthetist    How long can you walk comfortably?  not able to walk yet.    Patient Stated Goals  to walk again with prosthesis    Currently in Pain?  Yes    Pain Score  5     Pain Location  Knee    Pain Orientation  Right    Pain Descriptors / Indicators  Aching    Pain Type  Chronic pain    Pain Onset  More than a month ago    Pain Frequency  Constant            Vitals: 162/76 pulse 70    Ambulating with decreased UE support in // bars. Able to ambulate with LUE support due to R knee pain best, requires cueing for L hand, R foot L foot sequencing. 12x length of // bars. Frequent seated rest breaks due to R knee pain. Use of 5lb ankle weight on R foot for reduction of pain. CGA/ min A for weight shift and placement of LE's and UE for neutralizing body mechanics for optimal muscle recruitment and stability.    ambulate with quad cane in LUE with decreasing support from // bars with RUE; decreased to no UE support. x18 lengths of // bars; max cueing for body mechanics, sequencing with AD for stability, and safety ; cueing for cane placement for proper distance from body.    side step 2x length of // bars with BUE support; focused on foot clearance.   Standing hip extension 12x each LE BUE support ; very challenging for stabilizing on RLE due to knee discomfort.    4" step toe taps BUE support 10x each side, R knee wrapped.   Step over and back orange hurdle BUE support 10x each LE, cueing to decrease circumduction of LLE.   Seated hamstring stretch with foot on 4" step, 60 second holds, x2 trials each LE, cueing to hold not "pulse"    Seated 5lb ankle weights: -marches with upright posture x12 each Le, cuieng for higher amplitude and upright posture.  -LAQ 12x each LE; 2 sets -ER/IR 15 Patient requires seated rest breaks throughout standing intervention due to pain in R knee with weightbearing.    SPT to car, education on safety, hand placement, and lifting of LE to decrease risk of falling  Patient is progressing with ambulation in // bars with decreasing cueing for sequencing after repetition for positioning of quad cane. Standing interventions continue to be limited by R knee pain however use of a 5lb weight and ace wrap will help . Patient will benefit from skilled physical therapy for ambulation and transfers with prosthesis, strength  and stability, and increase stability with functional mobility for quality of life and decreased falls risk                       PT Education - 11/02/19 1654    Education Details  exercise technique, body mechanics    Person(s) Educated  Patient    Methods  Explanation;Demonstration;Tactile cues;Verbal  cues    Comprehension  Verbalized understanding;Returned demonstration;Verbal cues required;Tactile cues required       PT Short Term Goals - 09/27/19 1023      PT SHORT TERM GOAL #1   Title  Patient will be independent in home exercise program to improve strength/mobility for better functional independence with ADLs.    Baseline  10/7: HEP given; 11/23: HEP compliant    Time  2    Period  Weeks    Status  Partially Met    Target Date  08/25/19        PT Long Term Goals - 10/04/19 1017      PT LONG TERM GOAL #1   Title  Patient will tolerate static standing for > 5 minutes for standing ADL performance with weight acceptance onto prosthetic limb to improve quality of life and mobility.    Baseline  10/7: 1 minute 39 seconds with heavy UE support on RW 11/23: 1 minute 50 seconds static stand terminated due to back pain not knees. , 3 minutes of walking/dynamic standing.    Time  8    Period  Weeks    Status  Partially Met    Target Date  11/29/19      PT LONG TERM GOAL #2   Title  Patient will ambulate >100 feet with RW, mod I with right prosthetic exhibiting reciprocal gait pattern with good  management of prosthetic and good safety awareness for improved home ambulation    Baseline  10/7: unable to ambulate 11/23: 86 ft with RW    Time  8    Period  Weeks    Status  Partially Met    Target Date  11/29/19      PT LONG TERM GOAL #3   Title  Patient (< 5 years old) will complete five times sit to stand test in < 10 seconds indicating an increased LE strength and improved balance.    Baseline  10/7: 21 seconds with heavy BUE support from raised surface  11/23: 16 seconds with BUE support and RW    Time  8    Period  Weeks    Status  Partially Met    Target Date  11/29/19      PT LONG TERM GOAL #4   Title  Patient will increase BLE gross strength to 4+/5 as to improve functional strength for independent gait, increased standing tolerance and increased ADL ability.    Baseline  10/7: L 3+/5 R 4-/5 11/23: L 4-/5 R 4/5 with knee 4-/5 due to pain    Time  8    Period  Weeks    Status  Partially Met    Target Date  11/29/19            Plan - 11/02/19 1656    Clinical Impression Statement  Patient is progressing with ambulation in // bars with decreasing cueing for sequencing after repetition for positioning of quad cane. Standing interventions continue to be limited by R knee pain however use of a 5lb weight and ace wrap will help . Patient will benefit from skilled physical therapy for ambulation and transfers with prosthesis, strength and stability, and increase stability with functional mobility for quality of life and decreased falls risk    Personal Factors and Comorbidities  Age;Comorbidity 3+;Education;Finances;Fitness;Past/Current Experience;Social Background;Time since onset of injury/illness/exacerbation;Other    Comorbidities  anemia, anxiety, arthritis, back pain, bradycardia, CHF, CKD stage III, DM, diabetic nephropathy, dyspnea, GERD, heart murmer, HLD,  hyperparathyroidism, HTN, blind in L eye, obesity, oncychomycosis    Examination-Activity Limitations  Bathing;Bed Mobility;Caring for Others;Bend;Carry;Dressing;Continence;Hygiene/Grooming;Stairs;Squat;Reach Overhead;Locomotion Level;Lift;Stand;Toileting;Transfers    Examination-Participation Restrictions  Church;Cleaning;Community Activity;Interpersonal Relationship;Driving;Laundry;Volunteer;Shop;Personal Finances;Meal Prep;Yard Work;Other    Stability/Clinical Decision Making  Evolving/Moderate complexity    Rehab Potential  Fair    PT Frequency  2x / week    PT Duration  8  weeks    PT Treatment/Interventions  ADLs/Self Care Home Management;Aquatic Therapy;Biofeedback;Cryotherapy;Electrical Stimulation;Iontophoresis 71m/ml Dexamethasone;Moist Heat;Traction;Ultrasound;Therapeutic activities;Functional mobility training;Stair training;Gait training;DME Instruction;Therapeutic exercise;Balance training;Neuromuscular re-education;Patient/family education;Manual techniques;Wheelchair mTax adviserCompression bandaging;Scar mobilization;Passive range of motion;Dry needling;Energy conservation;Splinting;Taping    PT Next Visit Plan  ambulate with RW    PT Home Exercise Plan  prone 10 minutes a day, wear prosthesis 1 hour a day, side lie posterior kick backs 10x    Consulted and Agree with Plan of Care  Patient       Patient will benefit from skilled therapeutic intervention in order to improve the following deficits and impairments:  Abnormal gait, Cardiopulmonary status limiting activity, Decreased activity tolerance, Decreased balance, Decreased knowledge of precautions, Decreased endurance, Decreased coordination, Decreased knowledge of use of DME, Decreased mobility, Decreased range of motion, Decreased safety awareness, Difficulty walking, Decreased strength, Hypomobility, Impaired flexibility, Impaired perceived functional ability, Impaired sensation, Obesity, Prosthetic Dependency, Postural dysfunction, Improper body mechanics, Pain  Visit Diagnosis: Other abnormalities of gait and mobility  Muscle weakness (generalized)  Unsteadiness on feet     Problem List Patient Active Problem List   Diagnosis Date Noted  . Chronic pain syndrome 07/13/2019  . Wound dehiscence 05/29/2019  . Hx of BKA, left ([place] 05/24/2019  . PVD (peripheral vascular disease) ([place] 05/13/2019  . Chronic osteomyelitis of ankle and foot, left ([place] 05/05/2019  . Chronic diastolic CHF (congestive heart failure) ([place] 04/19/2019  . Leg edema 04/19/2019  . Acute  bacterial endocarditis 04/19/2019  . Chest pain of uncertain etiology 037/36/6815 . Ulcer of left ankle ([place] 04/14/2019  . Ankle deformity, left 01/21/2019  . Diabetic polyneuropathy associated with type 2 diabetes mellitus ([place] 01/21/2019  . Aortic valve endocarditis   . Bacteremia due to Streptococcus 12/24/2018  . Diabetic ulcer of left ankle associated with diabetes mellitus due to underlying condition, with fat layer exposed ([place] 11/09/2018  . Malignant hypertension (arteriolar nephrosclerosis), stage 1-4 or unspecified chronic kidney disease 07/15/2018  . Stage 4 chronic kidney disease ([place] 07/15/2018  . Morbid obesity with BMI of 45.0-49.9, adult ([place] 07/15/2018  . Excessive daytime sleepiness 07/15/2018  . Other fatigue 03/11/2018  . Essential hypertension 03/11/2018  . Vitamin D deficiency 03/11/2018  . Congestive heart failure ([place] 03/11/2018  . Primary osteoarthritis of right knee 01/06/2018  . Uncontrolled type 2 diabetes mellitus with polyneuropathy ([place] 10/03/2017  . Chronic pain of right knee 10/02/2017  . Obesity 10/02/2017  . Charcot's joint of left foot 08/29/2017  . Lymphedema 09/06/2016  . Hyponatremia with extracellular fluid depletion 02/02/2016  . Nausea with vomiting, unspecified 02/02/2016  . Closed nondisplaced fracture of right patella 08/23/2015  . Acute cystitis without hematuria 08/14/2014  . Colitis 08/14/2014  . HTN (hypertension), malignant 08/14/2014  . Acute renal failure superimposed on stage 3 chronic kidney disease ([place] 06/26/2014  . Hyperparathyroidism, unspecified ([place] 02/24/2014  . Allergic rhinitis 02/22/2014  . Onychomycosis 02/22/2014  . Anxiety 11/05/2013  . Bradycardia 05/14/2013  . Gastro-esophageal reflux disease without esophagitis 04/05/2013  . Anemia in other chronic diseases classified elsewhere 01/27/2013   MJanna Arch PT, DPT  11/02/2019, 5:00 PM  [place], [place], [address] Phone: [phone]   Fax:  [phone]  Name: Rachel Holmes MRN: [id number] Date of Birth: [birth year]

## 2019-11-04 ENCOUNTER — Ambulatory Visit: Payer: Medicare HMO

## 2019-11-08 ENCOUNTER — Ambulatory Visit (HOSPITAL_BASED_OUTPATIENT_CLINIC_OR_DEPARTMENT_OTHER): Payer: Medicare HMO | Admitting: Student in an Organized Health Care Education/Training Program

## 2019-11-08 ENCOUNTER — Ambulatory Visit
Admission: RE | Admit: 2019-11-08 | Discharge: 2019-11-08 | Disposition: A | Discharge status: Home / Self Care | Payer: Medicare HMO | Source: Ambulatory Visit | Attending: Student in an Organized Health Care Education/Training Program | Admitting: Student in an Organized Health Care Education/Training Program

## 2019-11-08 ENCOUNTER — Other Ambulatory Visit: Payer: Self-pay

## 2019-11-08 ENCOUNTER — Encounter: Payer: Self-pay | Admitting: Student in an Organized Health Care Education/Training Program

## 2019-11-08 ENCOUNTER — Ambulatory Visit: Payer: Medicare HMO | Attending: Nurse Practitioner

## 2019-11-08 VITALS — BP 146/80 | HR 70 | Temp 97.5°F | Resp 16 | Ht 68.0 in | Wt 290.0 lb

## 2019-11-08 DIAGNOSIS — M1711 Unilateral primary osteoarthritis, right knee: Secondary | ICD-10-CM

## 2019-11-08 DIAGNOSIS — G8929 Other chronic pain: Secondary | ICD-10-CM | POA: Insufficient documentation

## 2019-11-08 DIAGNOSIS — R2689 Other abnormalities of gait and mobility: Secondary | ICD-10-CM | POA: Diagnosis not present

## 2019-11-08 DIAGNOSIS — M25561 Pain in right knee: Secondary | ICD-10-CM | POA: Diagnosis present

## 2019-11-08 DIAGNOSIS — R2681 Unsteadiness on feet: Secondary | ICD-10-CM | POA: Diagnosis present

## 2019-11-08 DIAGNOSIS — M6281 Muscle weakness (generalized): Secondary | ICD-10-CM

## 2019-11-08 MED ORDER — ROPIVACAINE HCL 2 MG/ML IJ SOLN
INTRAMUSCULAR | Status: AC
  Filled 2019-11-08: qty 10

## 2019-11-08 MED ORDER — LIDOCAINE HCL 2 % IJ SOLN
20.0000 mL | Freq: Once | INTRAMUSCULAR | Status: AC
  Administered 2019-11-08: 13:00:00 400 mg

## 2019-11-08 MED ORDER — DEXAMETHASONE SODIUM PHOSPHATE 10 MG/ML IJ SOLN
10.0000 mg | Freq: Once | INTRAMUSCULAR | Status: AC
  Administered 2019-11-08: 13:00:00 10 mg

## 2019-11-08 MED ORDER — LIDOCAINE HCL 2 % IJ SOLN
INTRAMUSCULAR | Status: AC
  Filled 2019-11-08: qty 20

## 2019-11-08 MED ORDER — ROPIVACAINE HCL 2 MG/ML IJ SOLN
9.0000 mL | Freq: Once | INTRAMUSCULAR | Status: AC
  Administered 2019-11-08: 13:00:00 9 mL via PERINEURAL

## 2019-11-08 MED ORDER — DEXAMETHASONE SODIUM PHOSPHATE 10 MG/ML IJ SOLN
INTRAMUSCULAR | Status: AC
  Filled 2019-11-08: qty 1

## 2019-11-08 NOTE — Therapy (Signed)
[place] MAIN [facility] SERVICES [address] [place], [place], [address] Phone: [phone]   Fax:  [phone]  Physical Therapy Treatment  Patient Details  Name: Rachel Holmes MRN: [id number] Date of Birth: 02-09-1961 Referring Provider (PT): Eulogio Ditch    Encounter Date: 11/08/2019  PT End of Session - 11/08/19 1251    Visit Number  17    Number of Visits  28    Date for PT Re-Evaluation  11/29/19    Authorization Type  7/10 PN 11/23    PT Start Time  1100    PT Stop Time  1146    PT Time Calculation (min)  46 min    Equipment Utilized During Treatment  Gait belt;Other (comment)   L prosthesis   Activity Tolerance  Patient tolerated treatment well;Patient limited by fatigue    Behavior During Therapy  [facility] for tasks assessed/performed       Past Medical History:  Diagnosis Date  . Allergic rhinitis   . Allergy   . Anemia   . Anxiety   . Arthritis   . Back pain   . Bradycardia   . Breast mass    Patient can no longer palpate specific masses but showed tech general area of concern  . CHF (congestive heart failure) ([place])   . CKD (chronic kidney disease)    STAGE 3  . Constipation   . Diabetes mellitus without complication ([place])   . Diabetic nephropathy ([place])   . Dyspnea   . GERD (gastroesophageal reflux disease)   . Heart murmur   . HLD (hyperlipidemia)   . Hyperparathyroidism ([place])   . Hypertension   . Joint pain   . Leg edema   . Legally blind in left eye, as defined in [place]   . Lymphedema   . Obesity   . Onychomycosis     Past Surgical History:  Procedure Laterality Date  . ABDOMINAL HYSTERECTOMY    . AMPUTATION Left 05/05/2019   Procedure: AMPUTATION BELOW KNEE;  Surgeon: Katha Cabal, MD;  Location: [facility] ORS;  Service: Vascular;  Laterality: Left;  . BREAST BIOPSY Left 2014   FNA 12:00 position - Negative  . EYE SURGERY Left 2007   removed a lens, no lens implanted  . IR FLUORO GUIDE CV LINE RIGHT   12/29/2018  . IR REMOVAL TUN CV CATH W/O FL  02/19/2019  . IR US GUIDE VASC ACCESS RIGHT  12/29/2018  . TEE WITHOUT CARDIOVERSION N/A 12/28/2018   Procedure: TRANSESOPHAGEAL ECHOCARDIOGRAM (TEE);  Surgeon: Sueanne Margarita, MD;  [facility];  Service: Cardiovascular;  Laterality: N/A;  . VAGINAL HYSTERECTOMY     abdominal hyst, not a vaginal hyst    There were no vitals filed for this visit.  Subjective Assessment - 11/08/19 1132    Subjective  Patient missed last session due to ear problems. Is getting a trial nerve block today. No falls or LOB since last session.    Patient is accompained by:  Family member   ex husband   Pertinent History  Patient is a pleasant 59 year old female who presents for gait training/ stability for L BKA. Recently obtained her prosthesis last Friday 08/06/19 and has not worn it since the prosthetists office. PMH includes anemia, anxiety, arthritis, back pain, bradycardia, CHF, CKD stage II, DM, diabetic nephropathy, dyspnea, GERD, HLD, hyperparathyroidism, hypotension, legally blind in left eye, lymphedema, obesity, onychomycosis. Left BKA performed 05/05/19, noted in history that patient had  a fall after procedure injuring the anterior portion of the suture line. Currently using a manual chair to get around the home. At this time she can't get into bathroom due to wheelchair, is waiting for a handicap apartment.    Limitations  Lifting;Standing;Walking;House hold activities    How long can you sit comfortably?  n/a    How long can you stand comfortably?  only stood once with prosthetist    How long can you walk comfortably?  not able to walk yet.    Patient Stated Goals  to walk again with prosthesis    Currently in Pain?  Yes    Pain Score  5     Pain Location  Knee    Pain Orientation  Right    Pain Descriptors / Indicators  Aching    Pain Type  Chronic pain    Pain Onset  More than a month ago    Pain Frequency  Constant              Vitals:  160/69 pulse 70         Ambulating with decreased UE support in // bars. Able to ambulate with LUE support due to R knee pain best, requires cueing for L hand, R foot L foot sequencing. 12x length of // bars. Frequent seated rest breaks due to R knee pain. Use of 5lb ankle weight on R foot for reduction of pain. CGA/ min A for weight shift and placement of LE's and UE for neutralizing body mechanics for optimal muscle recruitment and stability.    ambulate with quad cane in LUE with decreasing support from // bars with RUE; decreased to no UE support. x18 lengths of // bars; max cueing for body mechanics, sequencing with AD for stability, and safety ; cueing for cane placement for proper distance from body.    side step 2x length of // bars with BUE support; focused on foot clearance.   Standing hip extension 12x each LE BUE support ; very challenging for stabilizing on RLE due to knee discomfort.    4" step toe taps BUE support 10x each side, R knee wrapped.    Step over and back orange hurdle BUE support 10x each LE, cueing to decrease circumduction of LLE.    Seated hamstring stretch with foot on 4" step, 60 second holds, x2 trials each LE, cueing to hold not "pulse"    Seated 5lb ankle weights: -marches with upright posture x12 each Le, cuieng for higher amplitude and upright posture.  -LAQ 12x each LE; 2 sets -ER/IR 15 Patient requires seated rest breaks throughout standing intervention due to pain in R knee with weightbearing.     Patient continues to progress with improved stability with decreased UE support during ambulatory mobility in // bars. Patient is increasing fluidity of cane progression and safety. Patient will benefit from skilled physical therapy for ambulation and transfers with prosthesis, strength and stability, and increase stability with functional mobility for quality of life and decreased falls risk             PT Education - 11/08/19 1250     Education Details  exercise technique, body mechanics    Person(s) Educated  Patient    Methods  Explanation;Demonstration;Tactile cues;Verbal cues    Comprehension  Verbalized understanding;Returned demonstration;Verbal cues required;Tactile cues required       PT Short Term Goals - 09/27/19 1023      PT SHORT TERM GOAL #1  Title  Patient will be independent in home exercise program to improve strength/mobility for better functional independence with ADLs.    Baseline  10/7: HEP given; 11/23: HEP compliant    Time  2    Period  Weeks    Status  Partially Met    Target Date  08/25/19        PT Long Term Goals - 10/04/19 1017      PT LONG TERM GOAL #1   Title  Patient will tolerate static standing for > 5 minutes for standing ADL performance with weight acceptance onto prosthetic limb to improve quality of life and mobility.    Baseline  10/7: 1 minute 39 seconds with heavy UE support on RW 11/23: 1 minute 50 seconds static stand terminated due to back pain not knees. , 3 minutes of walking/dynamic standing.    Time  8    Period  Weeks    Status  Partially Met    Target Date  11/29/19      PT LONG TERM GOAL #2   Title  Patient will ambulate >100 feet with RW, mod I with right prosthetic exhibiting reciprocal gait pattern with good  management of prosthetic and good safety awareness for improved home ambulation    Baseline  10/7: unable to ambulate 11/23: 86 ft with RW    Time  8    Period  Weeks    Status  Partially Met    Target Date  11/29/19      PT LONG TERM GOAL #3   Title  Patient (< 62 years old) will complete five times sit to stand test in < 10 seconds indicating an increased LE strength and improved balance.    Baseline  10/7: 21 seconds with heavy BUE support from raised surface 11/23: 16 seconds with BUE support and RW    Time  8    Period  Weeks    Status  Partially Met    Target Date  11/29/19      PT LONG TERM GOAL #4   Title  Patient will increase BLE  gross strength to 4+/5 as to improve functional strength for independent gait, increased standing tolerance and increased ADL ability.    Baseline  10/7: L 3+/5 R 4-/5 11/23: L 4-/5 R 4/5 with knee 4-/5 due to pain    Time  8    Period  Weeks    Status  Partially Met    Target Date  11/29/19            Plan - 11/08/19 1253    Clinical Impression Statement  Patient continues to progress with improved stability with decreased UE support during ambulatory mobility in // bars. Patient is increasing fluidity of cane progression and safety. Patient will benefit from skilled physical therapy for ambulation and transfers with prosthesis, strength and stability, and increase stability with functional mobility for quality of life and decreased falls risk    Personal Factors and Comorbidities  Age;Comorbidity 3+;Education;Finances;Fitness;Past/Current Experience;Social Background;Time since onset of injury/illness/exacerbation;Other    Comorbidities  anemia, anxiety, arthritis, back pain, bradycardia, CHF, CKD stage III, DM, diabetic nephropathy, dyspnea, GERD, heart murmer, HLD, hyperparathyroidism, HTN, blind in L eye, obesity, oncychomycosis    Examination-Activity Limitations  Bathing;Bed Mobility;Caring for Others;Bend;Carry;Dressing;Continence;Hygiene/Grooming;Stairs;Squat;Reach Overhead;Locomotion Level;Lift;Stand;Toileting;Transfers    Examination-Participation Restrictions  Church;Cleaning;Community Activity;Interpersonal Relationship;Driving;Laundry;Volunteer;Shop;Personal Finances;Meal Prep;Yard Work;Other    Stability/Clinical Decision Making  Evolving/Moderate complexity    Rehab Potential  Fair    PT Frequency  2x / week    PT Duration  8 weeks    PT Treatment/Interventions  ADLs/Self Care Home Management;Aquatic Therapy;Biofeedback;Cryotherapy;Electrical Stimulation;Iontophoresis 52m/ml Dexamethasone;Moist Heat;Traction;Ultrasound;Therapeutic activities;Functional mobility training;Stair  training;Gait training;DME Instruction;Therapeutic exercise;Balance training;Neuromuscular re-education;Patient/family education;Manual techniques;Wheelchair mTax adviserCompression bandaging;Scar mobilization;Passive range of motion;Dry needling;Energy conservation;Splinting;Taping    PT Next Visit Plan  ambulate with RW    PT Home Exercise Plan  prone 10 minutes a day, wear prosthesis 1 hour a day, side lie posterior kick backs 10x    Consulted and Agree with Plan of Care  Patient       Patient will benefit from skilled therapeutic intervention in order to improve the following deficits and impairments:  Abnormal gait, Cardiopulmonary status limiting activity, Decreased activity tolerance, Decreased balance, Decreased knowledge of precautions, Decreased endurance, Decreased coordination, Decreased knowledge of use of DME, Decreased mobility, Decreased range of motion, Decreased safety awareness, Difficulty walking, Decreased strength, Hypomobility, Impaired flexibility, Impaired perceived functional ability, Impaired sensation, Obesity, Prosthetic Dependency, Postural dysfunction, Improper body mechanics, Pain  Visit Diagnosis: Other abnormalities of gait and mobility  Muscle weakness (generalized)  Unsteadiness on feet     Problem List Patient Active Problem List   Diagnosis Date Noted  . Chronic pain syndrome 07/13/2019  . Wound dehiscence 05/29/2019  . Hx of BKA, left ([place] 05/24/2019  . PVD (peripheral vascular disease) ([place] 05/13/2019  . Chronic osteomyelitis of ankle and foot, left ([place] 05/05/2019  . Chronic diastolic CHF (congestive heart failure) ([place] 04/19/2019  . Leg edema 04/19/2019  . Acute bacterial endocarditis 04/19/2019  . Chest pain of uncertain etiology 032/20/2542 . Ulcer of left ankle ([place] 04/14/2019  . Ankle deformity, left 01/21/2019  . Diabetic polyneuropathy associated with type 2 diabetes mellitus ([place] 01/21/2019  . Aortic  valve endocarditis   . Bacteremia due to Streptococcus 12/24/2018  . Diabetic ulcer of left ankle associated with diabetes mellitus due to underlying condition, with fat layer exposed ([place] 11/09/2018  . Malignant hypertension (arteriolar nephrosclerosis), stage 1-4 or unspecified chronic kidney disease 07/15/2018  . Stage 4 chronic kidney disease ([place] 07/15/2018  . Morbid obesity with BMI of 45.0-49.9, adult ([place] 07/15/2018  . Excessive daytime sleepiness 07/15/2018  . Other fatigue 03/11/2018  . Essential hypertension 03/11/2018  . Vitamin D deficiency 03/11/2018  . Congestive heart failure ([place] 03/11/2018  . Primary osteoarthritis of right knee 01/06/2018  . Uncontrolled type 2 diabetes mellitus with polyneuropathy ([place] 10/03/2017  . Chronic pain of right knee 10/02/2017  . Obesity 10/02/2017  . Charcot's joint of left foot 08/29/2017  . Lymphedema 09/06/2016  . Hyponatremia with extracellular fluid depletion 02/02/2016  . Nausea with vomiting, unspecified 02/02/2016  . Closed nondisplaced fracture of right patella 08/23/2015  . Acute cystitis without hematuria 08/14/2014  . Colitis 08/14/2014  . HTN (hypertension), malignant 08/14/2014  . Acute renal failure superimposed on stage 3 chronic kidney disease ([place] 06/26/2014  . Hyperparathyroidism, unspecified ([place] 02/24/2014  . Allergic rhinitis 02/22/2014  . Onychomycosis 02/22/2014  . Anxiety 11/05/2013  . Bradycardia 05/14/2013  . Gastro-esophageal reflux disease without esophagitis 04/05/2013  . Anemia in other chronic diseases classified elsewhere 01/27/2013   MJanna Arch PT, DPT   11/08/2019, 12:54 PM  [address] RUniversity [facility] LLCSERVICES [address] [address]Phone: [phone]  Fax:  [phone] Name: Rachel RACETTEMRN: [id number] of Birth: 508-13-1962

## 2019-11-08 NOTE — Progress Notes (Signed)
Patient's Name: Rachel Holmes  MRN: [id number]  Referring Provider: Sharyne Peach, MD  DOB: 1961-09-22  PCP: Sharyne Peach, MD  DOS: 11/08/2019  Note by: Gillis Santa, MD  Service setting: Ambulatory outpatient  Specialty: Interventional Pain Management  Patient type: Established  Location: [facility] (AMB) Pain Management Facility  Visit type: Interventional Procedure   Primary Reason for Visit: Interventional Pain Management Treatment. CC: Knee Pain (right)  Procedure:          Anesthesia, Analgesia, Anxiolysis:  Type: Genicular Nerves Block (Superior-lateral, Superior-medial, and Inferior-medial Genicular Nerves) #1  CPT: [id number]      Primary Purpose: Diagnostic Region: Lateral, Anterior, and Medial aspects of the knee joint, above and below the knee joint proper. Level: Superior and inferior to the knee joint. Target Area: For Genicular Nerve block(s), the targets are: the superior-lateral genicular nerve, located in the lateral distal portion of the femoral shaft as it curves to form the lateral epicondyle, in the region of the distal femoral metaphysis; the superior-medial genicular nerve, located in the medial distal portion of the femoral shaft as it curves to form the medial epicondyle; and the inferior-medial genicular nerve, located in the medial, proximal portion of the tibial shaft, as it curves to form the medial epicondyle, in the region of the proximal tibial metaphysis. Approach: Anterior, percutaneous, ipsilateral approach. Laterality: Right knee  Type: Local Anesthesia  Local Anesthetic: Lidocaine 1-2%  Position: Modified Fowler's position with pillows under the targeted knee(s).   Indications: 1. Primary osteoarthritis of right knee   2. Chronic pain of right knee    Pain Score: Pre-procedure: 8 /10 Post-procedure: 0-No pain/10   Pre-op Assessment:  Rachel Holmes is a 59 y.o. (year old), female patient, seen today for interventional treatment. She  has a past  surgical history that includes Abdominal hysterectomy; TEE without cardioversion (N/A, 12/28/2018); IR Fluoro Guide CV Line Right (12/29/2018); IR US Guide Vasc Access Right (12/29/2018); IR Removal Tun Cv Cath W/O FL (02/19/2019); Vaginal hysterectomy; Breast biopsy (Left, 2014); Eye surgery (Left, 2007); and Amputation (Left, 05/05/2019). Rachel Holmes has a current medication list which includes the following prescription(s): albuterol, amlodipine, aspirin, one touch ultra 2, clobetasol cream, clonidine, freestyle libre 14 day reader, freestyle libre 14 day sensor, docusate sodium, trulicity, duloxetine, ensure max protein, fluticasone, gabapentin, glucosamine-chondroitin, glucose blood, novolog flexpen, insulin pen needle, isosorbide mononitrate, onetouch ultrasoft, multivitamin with minerals, olmesartan, omeprazole, ondansetron, sodium bicarbonate, torsemide, tramadol, vitamin b-12, and ascorbic acid. Her primarily concern today is the Knee Pain (right)  Initial Vital Signs:  Pulse/HCG Rate: 63  Temp: (!) 97.5 F (36.4 C) Resp: 15 BP: 134/73 SpO2: 99 %  BMI: Estimated body mass index is 44.09 kg/m as calculated from the following:   Height as of this encounter: 5\' 8"  (1.727 m).   Weight as of this encounter: 290 lb (131.5 kg).  Risk Assessment: Allergies: Reviewed. She is allergic to statins; penicillins; codeine; ibuprofen; percocet [oxycodone-acetaminophen]; tramadol; and vicodin [hydrocodone-acetaminophen].  Allergy Precautions: None required Coagulopathies: Reviewed. None identified.  Blood-thinner therapy: None at this time Active Infection(s): Reviewed. None identified. Rachel Holmes is afebrile  Site Confirmation: Rachel Holmes was asked to confirm the procedure and laterality before marking the site Procedure checklist: Completed Consent: Before the procedure and under the influence of no sedative(s), amnesic(s), or anxiolytics, the patient was informed of the treatment options, risks and  possible complications. To fulfill our ethical and legal obligations, as recommended by the American Medical Association's Code of Ethics, I  have informed the patient of my clinical impression; the nature and purpose of the treatment or procedure; the risks, benefits, and possible complications of the intervention; the alternatives, including doing nothing; the risk(s) and benefit(s) of the alternative treatment(s) or procedure(s); and the risk(s) and benefit(s) of doing nothing. The patient was provided information about the general risks and possible complications associated with the procedure. These may include, but are not limited to: failure to achieve desired goals, infection, bleeding, organ or nerve damage, allergic reactions, paralysis, and death. In addition, the patient was informed of those risks and complications associated to the procedure, such as failure to decrease pain; infection; bleeding; organ or nerve damage with subsequent damage to sensory, motor, and/or autonomic systems, resulting in permanent pain, numbness, and/or weakness of one or several areas of the body; allergic reactions; (i.e.: anaphylactic reaction); and/or death. Furthermore, the patient was informed of those risks and complications associated with the medications. These include, but are not limited to: allergic reactions (i.e.: anaphylactic or anaphylactoid reaction(s)); adrenal axis suppression; blood sugar elevation that in diabetics may result in ketoacidosis or comma; water retention that in patients with history of congestive heart failure may result in shortness of breath, pulmonary edema, and decompensation with resultant heart failure; weight gain; swelling or edema; medication-induced neural toxicity; particulate matter embolism and blood vessel occlusion with resultant organ, and/or nervous system infarction; and/or aseptic necrosis of one or more joints. Finally, the patient was informed that Medicine is not an  exact science; therefore, there is also the possibility of unforeseen or unpredictable risks and/or possible complications that may result in a catastrophic outcome. The patient indicated having understood very clearly. We have given the patient no guarantees and we have made no promises. Enough time was given to the patient to ask questions, all of which were answered to the patient's satisfaction. Rachel Holmes has indicated that she wanted to continue with the procedure. Attestation: I, the ordering provider, attest that I have discussed with the patient the benefits, risks, side-effects, alternatives, likelihood of achieving goals, and potential problems during recovery for the procedure that I have provided informed consent. Date  Time: 11/08/2019 11:55 AM  Pre-Procedure Preparation:  Monitoring: As per clinic protocol. Respiration, ETCO2, SpO2, BP, heart rate and rhythm monitor placed and checked for adequate function Safety Precautions: Patient was assessed for positional comfort and pressure points before starting the procedure. Time-out: I initiated and conducted the "Time-out" before starting the procedure, as per protocol. The patient was asked to participate by confirming the accuracy of the "Time Out" information. Verification of the correct person, site, and procedure were performed and confirmed by me, the nursing staff, and the patient. "Time-out" conducted as per Joint Commission's Universal Protocol (UP.01.01.01). Time: 1243  Description of Procedure:          Area Prepped: Entire knee area, from mid-thigh to mid-shin, lateral, anterior, and medial aspects. Prepping solution: DuraPrep (Iodine Povacrylex [0.7% available iodine] and Isopropyl Alcohol, 74% w/w) Safety Precautions: Aspiration looking for blood return was conducted prior to all injections. At no point did we inject any substances, as a needle was being advanced. No attempts were made at seeking any paresthesias. Safe  injection practices and needle disposal techniques used. Medications properly checked for expiration dates. SDV (single dose vial) medications used. Description of the Procedure: Protocol guidelines were followed. The patient was placed in position over the procedure table. The target area was identified and the area prepped in the usual manner. Skin &  deeper tissues infiltrated with local anesthetic. Appropriate amount of time allowed to pass for local anesthetics to take effect. The procedure needles were then advanced to the target area. Proper needle placement secured. Negative aspiration confirmed. Solution injected in intermittent fashion, asking for systemic symptoms every 0.5cc of injectate. The needles were then removed and the area cleansed, making sure to leave some of the prepping solution back to take advantage of its long term bactericidal properties.  Vitals:   11/08/19 1208 11/08/19 1235 11/08/19 1245 11/08/19 1255  BP: 134/73 (!) 150/71 (!) 144/76 (!) 146/80  Pulse: 63 65 67 70  Resp:  15 14 16   Temp: (!) 97.5 F (36.4 C)     SpO2: 99% 100% 100% 100%  Weight: 290 lb (131.5 kg)     Height: 5\' 8"  (1.727 m)       Start Time: 1243 hrs. End Time: 1254 hrs. Materials:  Needle(s) Type: Spinal Needle Gauge: 25G Length: 3.5-in Medication(s): Please see orders for medications and dosing details. 6 cc solution made of 5 cc of 0.2% ropivacaine, 1 cc of Decadron 10 mg/cc.  2 cc injected at each level above for the right genicular nerves.  Also attempted to drain R knee effusion but only 1 cc of synovial fluid obtained. Imaging Guidance (Non-Spinal):          Type of Imaging Technique: Fluoroscopy Guidance (Non-Spinal) Indication(s): Assistance in needle guidance and placement for procedures requiring needle placement in or near specific anatomical locations not easily accessible without such assistance. Exposure Time: Please see nurses notes. Contrast: None used. Fluoroscopic  Guidance: I was personally present during the use of fluoroscopy. "Tunnel Vision Technique" used to obtain the best possible view of the target area. Parallax error corrected before commencing the procedure. "Direction-depth-direction" technique used to introduce the needle under continuous pulsed fluoroscopy. Once target was reached, antero-posterior, oblique, and lateral fluoroscopic projection used confirm needle placement in all planes. Images permanently stored in EMR. Interpretation: No contrast injected. I personally interpreted the imaging intraoperatively. Adequate needle placement confirmed in multiple planes. Permanent images saved into the patient's record.  Antibiotic Prophylaxis:   Anti-infectives (From admission, onward)   None     Indication(s): None identified  Post-operative Assessment:  Post-procedure Vital Signs:  Pulse/HCG Rate: 70  Temp: (!) 97.5 F (36.4 C) Resp: 16 BP: (!) 146/80 SpO2: 100 %  EBL: None  Complications: No immediate post-treatment complications observed by team, or reported by patient.  Note: The patient tolerated the entire procedure well. A repeat set of vitals were taken after the procedure and the patient was kept under observation following institutional policy, for this type of procedure. Post-procedural neurological assessment was performed, showing return to baseline, prior to discharge. The patient was provided with post-procedure discharge instructions, including a section on how to identify potential problems. Should any problems arise concerning this procedure, the patient was given instructions to immediately contact us, at any time, without hesitation. In any case, we plan to contact the patient by telephone for a follow-up status report regarding this interventional procedure.  Comments:  No additional relevant information.  Plan of Care  Orders:  Orders Placed This Encounter  Procedures  . Fluoro (C-Arm) (<60 min) (No Report)     Intraoperative interpretation by procedural physician at [facility].    Standing Status:   Standing    Number of Occurrences:   1    Order Specific Question:   Reason for exam:    Answer:  Assistance in needle guidance and placement for procedures requiring needle placement in or near specific anatomical locations not easily accessible without such assistance.   Medications ordered for procedure: Meds ordered this encounter  Medications  . lidocaine (XYLOCAINE) 2 % (with pres) injection 400 mg  . ropivacaine (PF) 2 mg/mL (0.2%) (NAROPIN) injection 9 mL  . dexamethasone (DECADRON) injection 10 mg   Medications administered: We administered lidocaine, ropivacaine (PF) 2 mg/mL (0.2%), and dexamethasone.  See the medical record for exact dosing, route, and time of administration.  Follow-up plan:   Return in about 5 weeks (around 12/13/2019) for Post Procedure Evaluation, virtual (s/p R GNB).      s/p R knee hyalgan 08/30/2019 and 09/13/2019, R knee steroid injection on 09/29/2019- not helpful,  R knee GNB- 11/08/19 and possibe RFA next    Recent Visits Date Type Provider Dept  10/18/19 Office Visit Gillis Santa, MD [facility]  09/29/19 Procedure visit Gillis Santa, MD [facility]  08/30/19 Procedure visit Gillis Santa, MD [facility]  Showing recent visits within past 90 days and meeting all other requirements   Today's Visits Date Type Provider Dept  11/08/19 Procedure visit Gillis Santa, MD [facility]  Showing today's visits and meeting all other requirements   Future Appointments Date Type Provider Dept  12/13/19 Appointment Gillis Santa, MD [facility]  Showing future appointments within next 90 days and meeting all other requirements   Disposition: Discharge home  Discharge Date & Time: 11/08/2019;   hrs.   Primary Care Physician: Sharyne Peach, MD Location: [facility] Note by:  Gillis Santa, MD Date: 11/08/2019; Time: 2:11 PM  Disclaimer:  Medicine is not an Chief Strategy Officer. The only guarantee in medicine is that nothing is guaranteed. It is important to note that the decision to proceed with this intervention was based on the information collected from the patient. The Data and conclusions were drawn from the patient's questionnaire, the interview, and the physical examination. Because the information was provided in large part by the patient, it cannot be guaranteed that it has not been purposely or unconsciously manipulated. Every effort has been made to obtain as much relevant data as possible for this evaluation. It is important to note that the conclusions that lead to this procedure are derived in large part from the available data. Always take into account that the treatment will also be dependent on availability of resources and existing treatment guidelines, considered by other Pain Management Practitioners as being common knowledge and practice, at the time of the intervention. For Medico-Legal purposes, it is also important to point out that variation in procedural techniques and pharmacological choices are the acceptable norm. The indications, contraindications, technique, and results of the above procedure should only be interpreted and judged by a Board-Certified Interventional Pain Specialist with extensive familiarity and expertise in the same exact procedure and technique.

## 2019-11-08 NOTE — Patient Instructions (Signed)

## 2019-11-08 NOTE — Progress Notes (Signed)
Safety precautions to be maintained throughout the outpatient stay will include: orient to surroundings, keep bed in low position, maintain call bell within reach at all times, provide assistance with transfer out of bed and ambulation.  

## 2019-11-09 ENCOUNTER — Telehealth: Payer: Self-pay | Admitting: *Deleted

## 2019-11-09 NOTE — Telephone Encounter (Signed)
Post proc check.No answer. LVM.

## 2019-11-10 ENCOUNTER — Ambulatory Visit: Payer: Medicare HMO

## 2019-11-15 ENCOUNTER — Ambulatory Visit: Payer: Medicare HMO

## 2019-11-15 ENCOUNTER — Ambulatory Visit (INDEPENDENT_AMBULATORY_CARE_PROVIDER_SITE_OTHER): Payer: Medicare HMO | Admitting: Vascular Surgery

## 2019-11-15 ENCOUNTER — Encounter (INDEPENDENT_AMBULATORY_CARE_PROVIDER_SITE_OTHER): Payer: Self-pay | Admitting: Vascular Surgery

## 2019-11-15 ENCOUNTER — Other Ambulatory Visit: Payer: Self-pay

## 2019-11-15 VITALS — BP 138/75 | HR 67 | Resp 12 | Ht 68.0 in | Wt 300.0 lb

## 2019-11-15 DIAGNOSIS — I890 Lymphedema, not elsewhere classified: Secondary | ICD-10-CM

## 2019-11-15 DIAGNOSIS — I10 Essential (primary) hypertension: Secondary | ICD-10-CM | POA: Diagnosis not present

## 2019-11-15 DIAGNOSIS — E1142 Type 2 diabetes mellitus with diabetic polyneuropathy: Secondary | ICD-10-CM | POA: Diagnosis not present

## 2019-11-15 DIAGNOSIS — R2689 Other abnormalities of gait and mobility: Secondary | ICD-10-CM

## 2019-11-15 DIAGNOSIS — R2681 Unsteadiness on feet: Secondary | ICD-10-CM

## 2019-11-15 DIAGNOSIS — Z89512 Acquired absence of left leg below knee: Secondary | ICD-10-CM | POA: Diagnosis not present

## 2019-11-15 DIAGNOSIS — M6281 Muscle weakness (generalized): Secondary | ICD-10-CM

## 2019-11-15 MED ORDER — TRAMADOL HCL 50 MG PO TABS: ORAL_TABLET | ORAL | 0 refills | Status: DC

## 2019-11-15 NOTE — Therapy (Signed)
[place] MAIN [facility] SERVICES [address] [place], [place], [address] Phone: [phone]   Fax:  [phone]  Physical Therapy Treatment  Patient Details  Name: Rachel Holmes MRN: [id number] Date of Birth: Jun 05, 1961 Referring Provider (PT): Eulogio Ditch    Encounter Date: 11/15/2019  PT End of Session - 11/15/19 1211    Visit Number  18    Number of Visits  28    Date for PT Re-Evaluation  11/29/19    Authorization Type  8/10 PN 11/23    PT Start Time  1101    PT Stop Time  1146    PT Time Calculation (min)  45 min    Equipment Utilized During Treatment  Gait belt;Other (comment)   L prosthesis   Activity Tolerance  Patient tolerated treatment well;Patient limited by fatigue    Behavior During Therapy  [facility] for tasks assessed/performed       Past Medical History:  Diagnosis Date  . Allergic rhinitis   . Allergy   . Anemia   . Anxiety   . Arthritis   . Back pain   . Bradycardia   . Breast mass    Patient can no longer palpate specific masses but showed tech general area of concern  . CHF (congestive heart failure) ([place])   . CKD (chronic kidney disease)    STAGE 3  . Constipation   . Diabetes mellitus without complication ([place])   . Diabetic nephropathy ([place])   . Dyspnea   . GERD (gastroesophageal reflux disease)   . Heart murmur   . HLD (hyperlipidemia)   . Hyperparathyroidism ([place])   . Hypertension   . Joint pain   . Leg edema   . Legally blind in left eye, as defined in [place]   . Lymphedema   . Obesity   . Onychomycosis     Past Surgical History:  Procedure Laterality Date  . ABDOMINAL HYSTERECTOMY    . AMPUTATION Left 05/05/2019   Procedure: AMPUTATION BELOW KNEE;  Surgeon: Katha Cabal, MD;  Location: [facility] ORS;  Service: Vascular;  Laterality: Left;  . BREAST BIOPSY Left 2014   FNA 12:00 position - Negative  . EYE SURGERY Left 2007   removed a lens, no lens implanted  . IR FLUORO GUIDE CV LINE RIGHT   12/29/2018  . IR REMOVAL TUN CV CATH W/O FL  02/19/2019  . IR US GUIDE VASC ACCESS RIGHT  12/29/2018  . TEE WITHOUT CARDIOVERSION N/A 12/28/2018   Procedure: TRANSESOPHAGEAL ECHOCARDIOGRAM (TEE);  Surgeon: Sueanne Margarita, MD;  Location: [facility] ENDOSCOPY;  Service: Cardiovascular;  Laterality: N/A;  . VAGINAL HYSTERECTOMY     abdominal hyst, not a vaginal hyst    There were no vitals filed for this visit.  Subjective Assessment - 11/15/19 1206    Subjective  Patient has received nerve block with R leg. C/o of posterior aspect of knee pain that started during procedure and worsened the following day. Is better today but continues to limit mobility. Is going to vascular surgeon for check up of prosthetic limb    Patient is accompained by:  Family member   ex husband   Pertinent History  Patient is a pleasant 59 year old female who presents for gait training/ stability for L BKA. Recently obtained her prosthesis last Friday 08/06/19 and has not worn it since the prosthetists office. PMH includes anemia, anxiety, arthritis, back pain, bradycardia, CHF, CKD stage II, DM, diabetic nephropathy, dyspnea, GERD,  HLD, hyperparathyroidism, hypotension, legally blind in left eye, lymphedema, obesity, onychomycosis. Left BKA performed 05/05/19, noted in history that patient had a fall after procedure injuring the anterior portion of the suture line. Currently using a manual chair to get around the home. At this time she can't get into bathroom due to wheelchair, is waiting for a handicap apartment.    Limitations  Lifting;Standing;Walking;House hold activities    How long can you sit comfortably?  n/a    How long can you stand comfortably?  only stood once with prosthetist    How long can you walk comfortably?  not able to walk yet.    Patient Stated Goals  to walk again with prosthesis    Pain Score  3     Pain Location  Knee    Pain Orientation  Right    Pain Descriptors / Indicators  Aching    Pain Type  Chronic  pain    Pain Onset  More than a month ago    Pain Frequency  Constant           .     Vitals: 145/57 pulse 66        Ambulating with decreased UE support in // bars. Able to ambulate with LUE support due to R knee pain best, requires cueing for L hand, R foot L foot sequencing. 12x length of // bars. Frequent seated rest breaks due to R knee pain.  CGA/ min A for weight shift and placement of LE's and UE for neutralizing body mechanics for optimal muscle recruitment and stability.    ambulate with quad cane in LUE x18 lengths of // bars; max cueing for body mechanics, sequencing with AD for stability, and safety ; cueing for cane placement for proper distance from body. "step to it then through it".     Standing hip extension 12x each LE BUE support cueing for straight leg,    Backwards ambulation with CGA, cueing for increasing step length bilaterally, BUE support. 1x length of // bars   Seated:  Seated hamstring stretch with foot on 4" step, 60 second holds, x2 trials each LE, cueing to hold not "pulse"    Cross body UE/LE alternating raises 12x each LE, challenging to patient.   -cross body tap sequencing in standing tapping SPT hand/ too challenging ot patient coordination, regressed to seated, max cueing requires. X 6 minutes   Patient requires seated rest breaks throughout standing intervention due to fatigue and limited capacity for prolonged mobility.       Patient presents to PT for first time since nerve block. She c/o of some posterior aspect of knee pain/"pinching" that improves with mobility. She is able to perform standing mobility without use of 5lb ankle weight for first time. Cross body coordination is challenging to patient and patient needs to be seated for performance. Patient will benefit from skilled physical therapy for ambulation and transfers with prosthesis, strength and stability, and increase stability with functional mobility for quality of life and  decreased falls risk             PT Education - 11/15/19 1210    Education Details  exercise technique, body mechanics    Person(s) Educated  Patient    Methods  Explanation;Demonstration;Tactile cues;Verbal cues    Comprehension  Verbalized understanding;Returned demonstration;Verbal cues required;Tactile cues required       PT Short Term Goals - 09/27/19 1023      PT SHORT TERM GOAL #1  Title  Patient will be independent in home exercise program to improve strength/mobility for better functional independence with ADLs.    Baseline  10/7: HEP given; 11/23: HEP compliant    Time  2    Period  Weeks    Status  Partially Met    Target Date  08/25/19        PT Long Term Goals - 10/04/19 1017      PT LONG TERM GOAL #1   Title  Patient will tolerate static standing for > 5 minutes for standing ADL performance with weight acceptance onto prosthetic limb to improve quality of life and mobility.    Baseline  10/7: 1 minute 39 seconds with heavy UE support on RW 11/23: 1 minute 50 seconds static stand terminated due to back pain not knees. , 3 minutes of walking/dynamic standing.    Time  8    Period  Weeks    Status  Partially Met    Target Date  11/29/19      PT LONG TERM GOAL #2   Title  Patient will ambulate >100 feet with RW, mod I with right prosthetic exhibiting reciprocal gait pattern with good  management of prosthetic and good safety awareness for improved home ambulation    Baseline  10/7: unable to ambulate 11/23: 86 ft with RW    Time  8    Period  Weeks    Status  Partially Met    Target Date  11/29/19      PT LONG TERM GOAL #3   Title  Patient (< 70 years old) will complete five times sit to stand test in < 10 seconds indicating an increased LE strength and improved balance.    Baseline  10/7: 21 seconds with heavy BUE support from raised surface 11/23: 16 seconds with BUE support and RW    Time  8    Period  Weeks    Status  Partially Met    Target  Date  11/29/19      PT LONG TERM GOAL #4   Title  Patient will increase BLE gross strength to 4+/5 as to improve functional strength for independent gait, increased standing tolerance and increased ADL ability.    Baseline  10/7: L 3+/5 R 4-/5 11/23: L 4-/5 R 4/5 with knee 4-/5 due to pain    Time  8    Period  Weeks    Status  Partially Met    Target Date  11/29/19            Plan - 11/15/19 1236    Clinical Impression Statement  Patient presents to PT for first time since nerve block. She c/o of some posterior aspect of knee pain/"pinching" that improves with mobility. She is able to perform standing mobility without use of 5lb ankle weight for first time. Cross body coordination is challenging to patient and patient needs to be seated for performance. Patient will benefit from skilled physical therapy for ambulation and transfers with prosthesis, strength and stability, and increase stability with functional mobility for quality of life and decreased falls risk    Personal Factors and Comorbidities  Age;Comorbidity 3+;Education;Finances;Fitness;Past/Current Experience;Social Background;Time since onset of injury/illness/exacerbation;Other    Comorbidities  anemia, anxiety, arthritis, back pain, bradycardia, CHF, CKD stage III, DM, diabetic nephropathy, dyspnea, GERD, heart murmer, HLD, hyperparathyroidism, HTN, blind in L eye, obesity, oncychomycosis    Examination-Activity Limitations  Bathing;Bed Mobility;Caring for Others;Bend;Carry;Dressing;Continence;Hygiene/Grooming;Stairs;Squat;Reach Overhead;Locomotion Level;Lift;Stand;Toileting;Transfers    Examination-Participation Restrictions  Church;Cleaning;Community Activity;Interpersonal Relationship;Driving;Laundry;Volunteer;Shop;Personal Finances;Meal Prep;Yard Work;Other    Stability/Clinical Decision Making  Evolving/Moderate complexity    Rehab Potential  Fair    PT Frequency  2x / week    PT Duration  8 weeks    PT  Treatment/Interventions  ADLs/Self Care Home Management;Aquatic Therapy;Biofeedback;Cryotherapy;Electrical Stimulation;Iontophoresis 84m/ml Dexamethasone;Moist Heat;Traction;Ultrasound;Therapeutic activities;Functional mobility training;Stair training;Gait training;DME Instruction;Therapeutic exercise;Balance training;Neuromuscular re-education;Patient/family education;Manual techniques;Wheelchair mTax adviserCompression bandaging;Scar mobilization;Passive range of motion;Dry needling;Energy conservation;Splinting;Taping    PT Next Visit Plan  ambulate with RW    PT Home Exercise Plan  prone 10 minutes a day, wear prosthesis 1 hour a day, side lie posterior kick backs 10x    Consulted and Agree with Plan of Care  Patient       Patient will benefit from skilled therapeutic intervention in order to improve the following deficits and impairments:  Abnormal gait, Cardiopulmonary status limiting activity, Decreased activity tolerance, Decreased balance, Decreased knowledge of precautions, Decreased endurance, Decreased coordination, Decreased knowledge of use of DME, Decreased mobility, Decreased range of motion, Decreased safety awareness, Difficulty walking, Decreased strength, Hypomobility, Impaired flexibility, Impaired perceived functional ability, Impaired sensation, Obesity, Prosthetic Dependency, Postural dysfunction, Improper body mechanics, Pain  Visit Diagnosis: Other abnormalities of gait and mobility  Muscle weakness (generalized)  Unsteadiness on feet     Problem List Patient Active Problem List   Diagnosis Date Noted  . Chronic pain syndrome 07/13/2019  . Wound dehiscence 05/29/2019  . Hx of BKA, left ([place] 05/24/2019  . PVD (peripheral vascular disease) ([place] 05/13/2019  . Chronic osteomyelitis of ankle and foot, left ([place] 05/05/2019  . Chronic diastolic CHF (congestive heart failure) ([place] 04/19/2019  . Leg edema 04/19/2019  . Acute bacterial  endocarditis 04/19/2019  . Chest pain of uncertain etiology 086/75/4492 . Ulcer of left ankle ([place] 04/14/2019  . Ankle deformity, left 01/21/2019  . Diabetic polyneuropathy associated with type 2 diabetes mellitus ([place] 01/21/2019  . Aortic valve endocarditis   . Bacteremia due to Streptococcus 12/24/2018  . Diabetic ulcer of left ankle associated with diabetes mellitus due to underlying condition, with fat layer exposed ([place] 11/09/2018  . Malignant hypertension (arteriolar nephrosclerosis), stage 1-4 or unspecified chronic kidney disease 07/15/2018  . Stage 4 chronic kidney disease ([place] 07/15/2018  . Morbid obesity with BMI of 45.0-49.9, adult ([place] 07/15/2018  . Excessive daytime sleepiness 07/15/2018  . Other fatigue 03/11/2018  . Essential hypertension 03/11/2018  . Vitamin D deficiency 03/11/2018  . Congestive heart failure ([facility] 03/11/2018  . Primary osteoarthritis of right knee 01/06/2018  . Uncontrolled type 2 diabetes mellitus with polyneuropathy ([place] 10/03/2017  . Chronic pain of right knee 10/02/2017  . Obesity 10/02/2017  . Charcot's joint of left foot 08/29/2017  . Lymphedema 09/06/2016  . Hyponatremia with extracellular fluid depletion 02/02/2016  . Nausea with vomiting, unspecified 02/02/2016  . Closed nondisplaced fracture of right patella 08/23/2015  . Acute cystitis without hematuria 08/14/2014  . Colitis 08/14/2014  . HTN (hypertension), malignant 08/14/2014  . Acute renal failure superimposed on stage 3 chronic kidney disease ([place] 06/26/2014  . Hyperparathyroidism, unspecified ([facility] 02/24/2014  . Allergic rhinitis 02/22/2014  . Onychomycosis 02/22/2014  . Anxiety 11/05/2013  . Bradycardia 05/14/2013  . Gastro-esophageal reflux disease without esophagitis 04/05/2013  . Anemia in other chronic diseases classified elsewhere 01/27/2013   MJanna Arch PT, DPT   11/15/2019, 12:38 PM  [place] [facility] [address] [address]Phone: [phone]  Fax:  [phone]  Name: DARLEENE CUMPIAN MRN: [id number] Date of Birth: [birth year]

## 2019-11-15 NOTE — Progress Notes (Signed)
MRN : [id number]  Rachel Holmes is a 59 y.o. (05/31/1961) female who presents with chief complaint of No chief complaint on file. [name]  History of Present Illness:   She returns to the office s/p left BKA on 05/05/2019.  She continues to have significant phantom pain but this is well controlled with tramadol and she is requested a refill.  She has her prosthesis and has been ambulating with assistance.  No other significant changes.  No outpatient medications have been marked as taking for the 11/15/19 encounter (Appointment) with Delana Meyer, Dolores Lory, MD.    Past Medical History:  Diagnosis Date  . Allergic rhinitis   . Allergy   . Anemia   . Anxiety   . Arthritis   . Back pain   . Bradycardia   . Breast mass    Patient can no longer palpate specific masses but showed tech general area of concern  . CHF (congestive heart failure) ([place])   . CKD (chronic kidney disease)    STAGE 3  . Constipation   . Diabetes mellitus without complication ([place])   . Diabetic nephropathy ([place])   . Dyspnea   . GERD (gastroesophageal reflux disease)   . Heart murmur   . HLD (hyperlipidemia)   . Hyperparathyroidism ([place])   . Hypertension   . Joint pain   . Leg edema   . Legally blind in left eye, as defined in [place]   . Lymphedema   . Obesity   . Onychomycosis     Past Surgical History:  Procedure Laterality Date  . ABDOMINAL HYSTERECTOMY    . AMPUTATION Left 05/05/2019   Procedure: AMPUTATION BELOW KNEE;  Surgeon: Katha Cabal, MD;  Location: [facility] ORS;  Service: Vascular;  Laterality: Left;  . BREAST BIOPSY Left 2014   FNA 12:00 position - Negative  . EYE SURGERY Left 2007   removed a lens, no lens implanted  . IR FLUORO GUIDE CV LINE RIGHT  12/29/2018  . IR REMOVAL TUN CV CATH W/O FL  02/19/2019  . IR US GUIDE VASC ACCESS RIGHT  12/29/2018  . TEE WITHOUT CARDIOVERSION N/A 12/28/2018   Procedure: TRANSESOPHAGEAL ECHOCARDIOGRAM (TEE);  Surgeon: Sueanne Margarita, MD;  Location: [facility]  ENDOSCOPY;  Service: Cardiovascular;  Laterality: N/A;  . VAGINAL HYSTERECTOMY     abdominal hyst, not a vaginal hyst    Social History Social History   Tobacco Use  . Smoking status: Never Smoker  . Smokeless tobacco: Never Used  Substance Use Topics  . Alcohol use: No  . Drug use: Never    Family History Family History  Problem Relation Age of Onset  . Breast cancer Sister 39  . Diabetes Sister   . Diabetes Mother   . Hypertension Mother   . Hyperlipidemia Mother   . Eating disorder Mother   . Obesity Mother     Allergies  Allergen Reactions  . Statins Shortness Of Breath and Other (See Comments)    Wheezing, short of breath  . Penicillins Hives and Other (See Comments)    Has patient had a PCN reaction causing immediate rash, facial/tongue/throat swelling, SOB or lightheadedness with hypotension: No Has patient had a PCN reaction causing severe rash involving mucus membranes or skin necrosis: No Has patient had a PCN reaction that required hospitalization No Has patient had a PCN reaction occurring within the last 10 years: No If all of the above answers are "NO", then may proceed with Cephalosporin use.  [name]  Codeine Nausea Only  . Ibuprofen Other (See Comments)    Reaction:  Raises pts BP  . Percocet [Oxycodone-Acetaminophen] Nausea Only  . Tramadol Nausea Only and Other (See Comments)    Can take if she has eaten. Currently takes with good results   . Vicodin [Hydrocodone-Acetaminophen] Itching and Nausea Only     REVIEW OF SYSTEMS (Negative unless checked)  Constitutional: [] Weight loss  [] Fever  [] Chills Cardiac: [] Chest pain   [] Chest pressure   [] Palpitations   [] Shortness of breath when laying flat   [] Shortness of breath with exertion. Vascular:  [] Pain in legs with walking   [x] Pain in legs at rest  [] History of DVT   [] Phlebitis   [] Swelling in legs   [] Varicose veins   [] Non-healing ulcers Pulmonary:   [] Uses home oxygen   [] Productive cough    [] Hemoptysis   [] Wheeze  [] COPD   [] Asthma Neurologic:  [] Dizziness   [] Seizures   [] History of stroke   [] History of TIA  [] Aphasia   [] Vissual changes   [] Weakness or numbness in arm   [] Weakness or numbness in leg Musculoskeletal:   [] Joint swelling   [] Joint pain   [] Low back pain Hematologic:  [] Easy bruising  [] Easy bleeding   [] Hypercoagulable state   [] Anemic Gastrointestinal:  [] Diarrhea   [] Vomiting  [] Gastroesophageal reflux/heartburn   [] Difficulty swallowing. Genitourinary:  [] Chronic kidney disease   [] Difficult urination  [] Frequent urination   [] Blood in urine Skin:  [] Rashes   [] Ulcers  Psychological:  [] History of anxiety   []  History of major depression.  Physical Examination  There were no vitals filed for this visit. There is no height or weight on file to calculate BMI. Gen: WD/WN, NAD Head: [place]/AT, No temporalis wasting.  Ear/Nose/Throat: Hearing grossly intact, nares w/o erythema or drainage Eyes: PER, EOMI, sclera nonicteric.  Neck: Supple, no large masses.   Pulmonary:  Good air movement, no audible wheezing bilaterally, no use of accessory muscles.  Cardiac: RRR, no JVD Vascular:  Left BKA well healed Gastrointestinal: Non-distended. No guarding/no peritoneal signs.  Musculoskeletal: M/S 5/5 throughout.  No deformity or atrophy.  Neurologic: CN 2-12 intact. Symmetrical.  Speech is fluent. Motor exam as listed above. Psychiatric: Judgment intact, Mood & affect appropriate for pt's clinical situation. Dermatologic: No rashes or ulcers noted.  No changes consistent with cellulitis. Lymph : No lichenification or skin changes of chronic lymphedema.  CBC Lab Results  Component Value Date   WBC 9.1 05/28/2019   HGB 9.4 (L) 05/28/2019   HCT 29.2 (L) 05/28/2019   MCV 91.0 05/28/2019   PLT 243 05/28/2019    BMET    Component Value Date/Time   NA 136 05/28/2019 2252   NA 139 06/25/2018 1050   NA 137 11/21/2013 0627   K 4.3 05/28/2019 2252   K 4.6 11/21/2013  0627   CL 103 05/28/2019 2252   CL 107 11/21/2013 0627   CO2 20 (L) 05/28/2019 2252   CO2 24 11/21/2013 0627   GLUCOSE 90 05/28/2019 2252   GLUCOSE 95 11/21/2013 0627   BUN 66 (H) 05/28/2019 2252   BUN 43 (H) 06/25/2018 1050   BUN 34 (H) 11/21/2013 0627   CREATININE 3.25 (H) 05/28/2019 2252   CREATININE 1.80 (H) 09/09/2014 1220   CALCIUM 8.8 (L) 05/28/2019 2252   CALCIUM 8.8 09/09/2014 1220   GFRNONAA 15 (L) 05/28/2019 2252   GFRNONAA 31 (L) 09/09/2014 1220   GFRNONAA 26 (L) 11/21/2013 0627   GFRAA 17 (L) 05/28/2019 2252  GFRAA 38 (L) 09/09/2014 1220   GFRAA 30 (L) 11/21/2013 0627   CrCl cannot be calculated (Patient's most recent lab result is older than the maximum 21 days allowed.).  COAG Lab Results  Component Value Date   INR 0.8 05/29/2019   INR 1.1 04/30/2019    Radiology Fluoro (C-Arm) (<60 min) (No Report)  Result Date: 11/08/2019 Fluoro was used, but no Radiologist interpretation will be provided. Please refer to "NOTES" tab for provider progress note.    Assessment/Plan 1. Hx of BKA, left ([place]) Continue with prosthesis  Follow up as needed  2. Diabetic polyneuropathy associated with type 2 diabetes mellitus ([place]) Continue hypoglycemic medications as already ordered, these medications have been reviewed and there are no changes at this time.  Hgb A1C to be monitored as already arranged by primary service   3. Essential hypertension Continue antihypertensive medications as already ordered, these medications have been reviewed and there are no changes at this time.   4. Lymphedema No surgery or intervention at this point in time.  I have reviewed my discussion with the patient regarding venous insufficiency and why it causes symptoms. I have discussed with the patient the chronic skin changes that accompany venous insufficiency and the long term sequela such as ulceration. Patient will contnue wearing graduated compression stockings on a daily basis, as  this has provided excellent control of his edema. The patient will put the stockings on first thing in the morning and removing them in the evening. The patient is reminded not to sleep in the stockings.  In addition, behavioral modification including elevation during the day will be initiated. Exercise is strongly encouraged.  Given the patient's good control and lack of any problems regarding the venous insufficiency and lymphedema a lymph pump in not need at this time.  The patient will follow up with me PRN should anything change.  The patient voices agreement with this plan.    [name], MD  11/15/2019 1:13 PM

## 2019-11-17 ENCOUNTER — Telehealth: Payer: Self-pay | Admitting: Student in an Organized Health Care Education/Training Program

## 2019-11-17 ENCOUNTER — Other Ambulatory Visit: Payer: Self-pay

## 2019-11-17 ENCOUNTER — Ambulatory Visit: Payer: Medicare HMO

## 2019-11-17 VITALS — BP 147/80

## 2019-11-17 DIAGNOSIS — M6281 Muscle weakness (generalized): Secondary | ICD-10-CM

## 2019-11-17 DIAGNOSIS — R2689 Other abnormalities of gait and mobility: Secondary | ICD-10-CM | POA: Diagnosis not present

## 2019-11-17 DIAGNOSIS — R2681 Unsteadiness on feet: Secondary | ICD-10-CM

## 2019-11-17 NOTE — Therapy (Addendum)
[place] MAIN [facility] SERVICES [address] [place], [place], [address] Phone: [phone]   Fax:  [phone]  Physical Therapy Treatment  Patient Details  Name: CLEOPATRA SARDO MRN: [id number] Date of Birth: 1961-05-10 Referring Provider (PT): Eulogio Ditch    Encounter Date: 11/17/2019  PT End of Session - 11/17/19 1146    Visit Number  19    Number of Visits  28    Date for PT Re-Evaluation  11/29/19    Authorization Type  9/10 PN 11/23    PT Start Time  1102    PT Stop Time  1145    PT Time Calculation (min)  43 min    Equipment Utilized During Treatment  Gait belt    Activity Tolerance  Patient tolerated treatment well;Patient limited by fatigue;No increased pain       Past Medical History:  Diagnosis Date  . Allergic rhinitis   . Allergy   . Anemia   . Anxiety   . Arthritis   . Back pain   . Bradycardia   . Breast mass    Patient can no longer palpate specific masses but showed tech general area of concern  . CHF (congestive heart failure) ([place])   . CKD (chronic kidney disease)    STAGE 3  . Constipation   . Diabetes mellitus without complication ([place])   . Diabetic nephropathy ([place])   . Dyspnea   . GERD (gastroesophageal reflux disease)   . Heart murmur   . HLD (hyperlipidemia)   . Hyperparathyroidism ([place])   . Hypertension   . Joint pain   . Leg edema   . Legally blind in left eye, as defined in [place]   . Lymphedema   . Obesity   . Onychomycosis     Past Surgical History:  Procedure Laterality Date  . ABDOMINAL HYSTERECTOMY    . AMPUTATION Left 05/05/2019   Procedure: AMPUTATION BELOW KNEE;  Surgeon: Katha Cabal, MD;  Location: [facility] ORS;  Service: Vascular;  Laterality: Left;  . BREAST BIOPSY Left 2014   FNA 12:00 position - Negative  . EYE SURGERY Left 2007   removed a lens, no lens implanted  . IR FLUORO GUIDE CV LINE RIGHT  12/29/2018  . IR REMOVAL TUN CV CATH W/O FL  02/19/2019  . IR US GUIDE VASC  ACCESS RIGHT  12/29/2018  . TEE WITHOUT CARDIOVERSION N/A 12/28/2018   Procedure: TRANSESOPHAGEAL ECHOCARDIOGRAM (TEE);  Surgeon: Sueanne Margarita, MD;  Location: [facility] ENDOSCOPY;  Service: Cardiovascular;  Laterality: N/A;  . VAGINAL HYSTERECTOMY     abdominal hyst, not a vaginal hyst    Vitals:   11/17/19 1103  BP: (!) 147/80    Subjective Assessment - 11/17/19 1143    Subjective  Pt presents with 8/10 cervical pain and is seeing prosthetist on Friday. Pt is having cataract surgery on Monday and will miss next week due to recovery time.    Patient is accompained by:  --   ex husband   Pertinent History  Patient is a pleasant 59 year old female who presents for gait training/ stability for L BKA. Recently obtained her prosthesis last Friday 08/06/19 and has not worn it since the prosthetists office. PMH includes anemia, anxiety, arthritis, back pain, bradycardia, CHF, CKD stage II, DM, diabetic nephropathy, dyspnea, GERD, HLD, hyperparathyroidism, hypotension, legally blind in left eye, lymphedema, obesity, onychomycosis. Left BKA performed 05/05/19, noted in history that patient had a fall after procedure injuring  the anterior portion of the suture line. Currently using a manual chair to get around the home. At this time she can't get into bathroom due to wheelchair, is waiting for a handicap apartment.    Limitations  Lifting;Standing;Walking;House hold activities    How long can you sit comfortably?  n/a    How long can you stand comfortably?  only stood once with prosthetist    How long can you walk comfortably?  not able to walk yet.    Patient Stated Goals  to walk again with prosthesis    Currently in Pain?  Yes    Pain Score  8     Pain Location  Neck    Pain Orientation  Right;Left    Pain Descriptors / Indicators  Aching    Pain Type  Chronic pain    Pain Onset  More than a month ago    Pain Frequency  Intermittent     CGA in // bars: -Front walk, 3 lengths of // bars -Side step, 2  lengths of // bars, cued for upright posture and pushing with legs instead of arms -Ambulating with cane to translate to use of AD in daily life, 8x lengths of // bars, cueing for cane and foot placement; seated rest breaks after every 2 lengths of // bars.  -Backwards stepping 2x length of // bars with cueing for weight shift and then foot placement -Taps to right, left, and front target returning to center after each direction, with RUE support, 8x reps with each leg, cueing for upright posture, required seated rest break afterwards  Seated: heat pad to cervical spine for reduction of cervical pain -Hamstring stretch on 6" step, 30 sec each LE; patient reports reduced pain/cramping after stretch -LAQ 5lbs, 10x each LE, 3-5 sec holds at ext, pt paused between reps -Marches 5lbs, 10x each LE, 3 sec concentric and 3 sec eccentric,  -GTB resisted adduction, 10x each LE -Scap retract to assist with reported neck pain, 10x, cueing for keeping chin upright, squeezing between shoulder blades, and keeping shoulders relaxed  Patient requires cueing via tactile, verbal, and demonstration for stability and mobility with active interventions.   Pt presents to PT with positive attitude and tolerated exercises well. Seated rest breaks between exercises were required due to pt reporting legs feeling tired and fatigue, but rest breaks have been decreasing in time required since previous sessions, indicating increased capacity for functional mobility. Pt also mentioned cramping in LLE. Pt continues to respond well to PT, and can benefit further from skilled PT intervention to improve gait mechanics with AD, transfers, balance, and strength.                         PT Education - 11/17/19 1117    Education Details  exercise technique, body mechanics, corrections with use of AD    Person(s) Educated  Patient    Methods  Explanation;Demonstration;Tactile cues;Verbal cues    Comprehension   Verbalized understanding;Returned demonstration;Verbal cues required;Tactile cues required       PT Short Term Goals - 09/27/19 1023      PT SHORT TERM GOAL #1   Title  Patient will be independent in home exercise program to improve strength/mobility for better functional independence with ADLs.    Baseline  10/7: HEP given; 11/23: HEP compliant    Time  2    Period  Weeks    Status  Partially Met    Target  Date  08/25/19        PT Long Term Goals - 10/04/19 1017      PT LONG TERM GOAL #1   Title  Patient will tolerate static standing for > 5 minutes for standing ADL performance with weight acceptance onto prosthetic limb to improve quality of life and mobility.    Baseline  10/7: 1 minute 39 seconds with heavy UE support on RW 11/23: 1 minute 50 seconds static stand terminated due to back pain not knees. , 3 minutes of walking/dynamic standing.    Time  8    Period  Weeks    Status  Partially Met    Target Date  11/29/19      PT LONG TERM GOAL #2   Title  Patient will ambulate >100 feet with RW, mod I with right prosthetic exhibiting reciprocal gait pattern with good  management of prosthetic and good safety awareness for improved home ambulation    Baseline  10/7: unable to ambulate 11/23: 86 ft with RW    Time  8    Period  Weeks    Status  Partially Met    Target Date  11/29/19      PT LONG TERM GOAL #3   Title  Patient (< 41 years old) will complete five times sit to stand test in < 10 seconds indicating an increased LE strength and improved balance.    Baseline  10/7: 21 seconds with heavy BUE support from raised surface 11/23: 16 seconds with BUE support and RW    Time  8    Period  Weeks    Status  Partially Met    Target Date  11/29/19      PT LONG TERM GOAL #4   Title  Patient will increase BLE gross strength to 4+/5 as to improve functional strength for independent gait, increased standing tolerance and increased ADL ability.    Baseline  10/7: L 3+/5 R 4-/5  11/23: L 4-/5 R 4/5 with knee 4-/5 due to pain    Time  8    Period  Weeks    Status  Partially Met    Target Date  11/29/19            Plan - 11/17/19 1104    Clinical Impression Statement  Pt presents to PT with positive attitude and tolerated exercises well. Seated rest breaks between exercises were required due to pt reporting legs feeling tired and fatigue, but rest breaks have been decreasing in time required since previous sessions, indicating increased capacity for functional mobility. Pt also mentioned cramping in LLE. Pt continues to respond well to PT, and can benefit further from skilled PT intervention to improve gait mechanics with AD, transfers, balance, and strength.    Personal Factors and Comorbidities  Age;Comorbidity 3+;Education;Finances;Fitness;Past/Current Experience;Social Background;Time since onset of injury/illness/exacerbation;Other    Comorbidities  anemia, anxiety, arthritis, back pain, bradycardia, CHF, CKD stage III, DM, diabetic nephropathy, dyspnea, GERD, heart murmer, HLD, hyperparathyroidism, HTN, blind in L eye, obesity, oncychomycosis    Examination-Activity Limitations  Bathing;Bed Mobility;Caring for Others;Bend;Carry;Dressing;Continence;Hygiene/Grooming;Stairs;Squat;Reach Overhead;Locomotion Level;Lift;Stand;Toileting;Transfers    Examination-Participation Restrictions  Church;Cleaning;Community Activity;Interpersonal Relationship;Driving;Laundry;Volunteer;Shop;Personal Finances;Meal Prep;Yard Work;Other    Stability/Clinical Decision Making  Evolving/Moderate complexity    Rehab Potential  Fair    PT Frequency  2x / week    PT Duration  8 weeks    PT Treatment/Interventions  ADLs/Self Care Home Management;Aquatic Therapy;Biofeedback;Cryotherapy;Electrical Stimulation;Iontophoresis 34m/ml Dexamethasone;Moist Heat;Traction;Ultrasound;Therapeutic activities;Functional mobility training;Stair training;Gait training;DME Instruction;Therapeutic  exercise;Balance training;Neuromuscular re-education;Patient/family education;Manual techniques;Wheelchair Tax adviser;Compression bandaging;Scar mobilization;Passive range of motion;Dry needling;Energy conservation;Splinting;Taping    PT Next Visit Plan  progress note    PT Home Exercise Plan  prone 10 minutes a day, wear prosthesis 1 hour a day, side lie posterior kick backs 10x    Consulted and Agree with Plan of Care  Patient       Patient will benefit from skilled therapeutic intervention in order to improve the following deficits and impairments:  Abnormal gait, Cardiopulmonary status limiting activity, Decreased activity tolerance, Decreased balance, Decreased knowledge of precautions, Decreased endurance, Decreased coordination, Decreased knowledge of use of DME, Decreased mobility, Decreased range of motion, Decreased safety awareness, Difficulty walking, Decreased strength, Hypomobility, Impaired flexibility, Impaired perceived functional ability, Impaired sensation, Obesity, Prosthetic Dependency, Postural dysfunction, Improper body mechanics, Pain  Visit Diagnosis: Other abnormalities of gait and mobility  Muscle weakness (generalized)  Unsteadiness on feet     Problem List Patient Active Problem List   Diagnosis Date Noted  . Acidosis 09/20/2019  . Benign hypertensive kidney disease with chronic kidney disease 09/20/2019  . Proteinuria 09/20/2019  . Secondary hyperparathyroidism of renal origin ([place]) 09/20/2019  . Cataract 09/02/2019  . Chronic pain syndrome 07/13/2019  . Wound dehiscence 05/29/2019  . Hx of BKA, left ([place]) 05/24/2019  . PVD (peripheral vascular disease) ([place]) 05/13/2019  . Chronic osteomyelitis of ankle and foot, left ([place]) 05/05/2019  . Chronic diastolic CHF (congestive heart failure) ([place]) 04/19/2019  . Leg edema 04/19/2019  . Acute bacterial endocarditis 04/19/2019  . Chest pain of uncertain etiology 85/27/7824  . Ulcer  of left ankle ([place]) 04/14/2019  . Ankle deformity, left 01/21/2019  . Diabetic polyneuropathy associated with type 2 diabetes mellitus ([place]) 01/21/2019  . Aortic valve endocarditis   . Bacteremia due to Streptococcus 12/24/2018  . Diabetic ulcer of left ankle associated with diabetes mellitus due to underlying condition, with fat layer exposed ([place]) 11/09/2018  . Malignant hypertension (arteriolar nephrosclerosis), stage 1-4 or unspecified chronic kidney disease 07/15/2018  . Stage 4 chronic kidney disease ([place]) 07/15/2018  . Morbid obesity with BMI of 45.0-49.9, adult ([place]) 07/15/2018  . Excessive daytime sleepiness 07/15/2018  . Other fatigue 03/11/2018  . Essential hypertension 03/11/2018  . Vitamin D deficiency 03/11/2018  . Congestive heart failure ([place]) 03/11/2018  . Primary osteoarthritis of right knee 01/06/2018  . Uncontrolled type 2 diabetes mellitus with polyneuropathy ([place]) 10/03/2017  . Chronic pain of right knee 10/02/2017  . Obesity 10/02/2017  . Charcot's joint of left foot 08/29/2017  . Lymphedema 09/06/2016  . Hyponatremia with extracellular fluid depletion 02/02/2016  . Nausea with vomiting, unspecified 02/02/2016  . Closed nondisplaced fracture of right patella 08/23/2015  . Acute cystitis without hematuria 08/14/2014  . Colitis 08/14/2014  . HTN (hypertension), malignant 08/14/2014  . Acute renal failure superimposed on stage 3 chronic kidney disease ([place]) 06/26/2014  . Hyperparathyroidism, unspecified ([place]) 02/24/2014  . Allergic rhinitis 02/22/2014  . Onychomycosis 02/22/2014  . Anxiety 11/05/2013  . Bradycardia 05/14/2013  . Gastro-esophageal reflux disease without esophagitis 04/05/2013  . Anemia in other chronic diseases classified elsewhere 01/27/2013  Florinda Marker, SPT  This entire session was performed under direct supervision and direction of a licensed therapist/therapist assistant . I have personally read, edited and approve of the note as  written.  Janna Arch, PT, DPT   11/17/2019, 12:16 PM  [place] MAIN [facility] SERVICES [address] [place], [place], [address] Phone: [phone]  Fax:  [phone]  Name: JAYMI TINNER MRN: [id number] Date of Birth: July 07, 1961

## 2019-11-17 NOTE — Telephone Encounter (Signed)
Patient states she is having cramps in behind the knee she had procedure on. Procedure was on 11-08-19 Wants to know if this is normal? Please call and assist patient

## 2019-11-18 ENCOUNTER — Telehealth: Payer: Self-pay

## 2019-11-18 DIAGNOSIS — G8929 Other chronic pain: Secondary | ICD-10-CM

## 2019-11-18 MED ORDER — METHOCARBAMOL 750 MG PO TABS: 750.0000 mg | ORAL_TABLET | Freq: Two times a day (BID) | ORAL | 0 refills | Status: AC | PRN

## 2019-11-18 NOTE — Telephone Encounter (Signed)
Heat can help with muscle spasms. I'll also sent in short supply of Robaxin to help with her muscle spasms. If issues with coverage, let me know and we can consider alternative.

## 2019-11-18 NOTE — Telephone Encounter (Signed)
Patient informed of Dr. Elwyn Lade advice of applying heat, and given instructions on how to take Robaxin.

## 2019-11-18 NOTE — Telephone Encounter (Signed)
Patient notified

## 2019-11-18 NOTE — Telephone Encounter (Signed)
Spoke with patient and she states that since her procedure she is having cramping pain in the back of her knee.  She states the original pain was in the front of the knee and that pain has gone away.  She is taking Tramadol as prescribed, but it is not helping the Cramping pain.  Is there anything that can be done for this cramping pain?

## 2019-11-22 ENCOUNTER — Ambulatory Visit: Payer: Medicare HMO

## 2019-11-24 ENCOUNTER — Ambulatory Visit: Payer: Medicare HMO

## 2019-11-29 ENCOUNTER — Other Ambulatory Visit: Payer: Self-pay

## 2019-11-29 ENCOUNTER — Ambulatory Visit: Payer: Medicare HMO

## 2019-11-29 DIAGNOSIS — R2689 Other abnormalities of gait and mobility: Secondary | ICD-10-CM | POA: Diagnosis not present

## 2019-11-29 DIAGNOSIS — R2681 Unsteadiness on feet: Secondary | ICD-10-CM

## 2019-11-29 DIAGNOSIS — M6281 Muscle weakness (generalized): Secondary | ICD-10-CM

## 2019-11-29 NOTE — Therapy (Signed)
[place] MAIN [facility] SERVICES [address] [place], [place], [address] Phone: [phone]   Fax:  [phone]  Physical Therapy Treatment/RECERT/Physical Therapy Progress Note   Dates of reporting period  09/27/19   to   11/29/19   Patient Details  Name: Rachel Holmes MRN: [id number] Date of Birth: 10-Nov-1960 Referring Provider (PT): Eulogio Ditch    Encounter Date: 11/29/2019  PT End of Session - 11/29/19 1129    Visit Number  20    Number of Visits  36    Date for PT Re-Evaluation  01/24/20    Authorization Type  10/10 PN 11/23; next session 1/10 PN on 11/29/19    PT Start Time  1100    PT Stop Time  1144    PT Time Calculation (min)  44 min    Equipment Utilized During Treatment  Gait belt    Activity Tolerance  Patient tolerated treatment well;Patient limited by fatigue;No increased pain       Past Medical History:  Diagnosis Date  . Allergic rhinitis   . Allergy   . Anemia   . Anxiety   . Arthritis   . Back pain   . Bradycardia   . Breast mass    Patient can no longer palpate specific masses but showed tech general area of concern  . CHF (congestive heart failure) ([place])   . CKD (chronic kidney disease)    STAGE 3  . Constipation   . Diabetes mellitus without complication ([place])   . Diabetic nephropathy ([place])   . Dyspnea   . GERD (gastroesophageal reflux disease)   . Heart murmur   . HLD (hyperlipidemia)   . Hyperparathyroidism ([place])   . Hypertension   . Joint pain   . Leg edema   . Legally blind in left eye, as defined in [place]   . Lymphedema   . Obesity   . Onychomycosis     Past Surgical History:  Procedure Laterality Date  . ABDOMINAL HYSTERECTOMY    . AMPUTATION Left 05/05/2019   Procedure: AMPUTATION BELOW KNEE;  Surgeon: Katha Cabal, MD;  Location: [facility] ORS;  Service: Vascular;  Laterality: Left;  . BREAST BIOPSY Left 2014   FNA 12:00 position - Negative  . EYE SURGERY Left 2007   removed a  lens, no lens implanted  . IR FLUORO GUIDE CV LINE RIGHT  12/29/2018  . IR REMOVAL TUN CV CATH W/O FL  02/19/2019  . IR US GUIDE VASC ACCESS RIGHT  12/29/2018  . TEE WITHOUT CARDIOVERSION N/A 12/28/2018   Procedure: TRANSESOPHAGEAL ECHOCARDIOGRAM (TEE);  Surgeon: Sueanne Margarita, MD;  Location: [facility] END[facility]COPY;  Service: Cardiovascular;  Laterality: N/A;  . VAGINAL HYSTERECTOMY     abdominal hyst, not a vaginal hyst    There were no vitals filed for this visit.  Subjective Assessment - 11/29/19 1110    Subjective  Patient went to orthotist since she was seen last, said she can keep having more socks added. Has been walking with her cane in her house. Walked into the bathroom to take a shower. Had her eye surgery successfully.    Patient is accompained by:  --   ex husband   Pertinent History  Patient is a pleasant 59 year old female who presents for gait training/ stability for L BKA. Recently obtained her prosthesis last Friday 08/06/19 and has not worn it since the prosthetists office. PMH includes anemia, anxiety, arthritis, back pain, bradycardia, CHF, CKD stage  II, DM, diabetic nephropathy, dyspnea, GERD, HLD, hyperparathyroidism, hypotension, legally blind in left eye, lymphedema, obesity, onychomycosis. Left BKA performed 05/05/19, noted in history that patient had a fall after procedure injuring the anterior portion of the suture line. Currently using a manual chair to get around the home. At this time she can't get into bathroom due to wheelchair, is waiting for a handicap apartment.    Limitations  Lifting;Standing;Walking;House hold activities    How long can you sit comfortably?  n/a    How long can you stand comfortably?  only stood once with prosthetist    How long can you walk comfortably?  not able to walk yet.    Patient Stated Goals  to walk again with prosthesis    Currently in Pain?  No/denies        Goals:  Static stand 5 minutes: primary weight shift onto prosthetic LLE;  terminated at 328 due to pain in back  4/10 pain  ambulate 100 ft with RW: able to perform 3/10 pain in R knee by end of session.  5x STS: 16 seconds first trials, 14 seconds second trial with one hand on chair one hand on walker. Able to perform one STS with no UE support with cueing for foot placement.  strength :  Hip flex:   L: 4 hip flexion, knee ext, flex 3 IR R: 4+ hip flexion, PF, DF 4 ext, flex 3+ IR   Treat:  Seated: GTB hamstring curls, 3 second concentric/ eccentric phases for 6 seconds total for one rep; 12x each LE.   5lb ankle weight: LAQ 3 second eccentric/concentric phase for control of muscle activation/sequencing 10x each LE.   opposite cross body arm leg ; very challenging for reciprocating motion for patient requiring intense concentration for performance 10x   Patient's condition has the potential to improve in response to therapy. Maximum improvement is yet to be obtained. The anticipated improvement is attainable and reasonable in a generally predictable time.  Patient reports she is improving with her ability to stand and walk.              PT Education - 11/29/19 1111    Education Details  goals, POC,    Person(s) Educated  Patient    Methods  Explanation;Demonstration;Tactile cues;Verbal cues    Comprehension  Verbalized understanding;Returned demonstration;Verbal cues required;Tactile cues required       PT Short Term Goals - 11/29/19 1135      PT SHORT TERM GOAL #1   Title  Patient will be independent in home exercise program to improve strength/mobility for better functional independence with ADLs.    Baseline  10/7: HEP given; 11/23: HEP compliant 1/25: HEP compliant, progressing with increasing challenge of HEP    Time  2    Period  Weeks    Status  Partially Met    Target Date  12/13/19        PT Long Term Goals - 11/29/19 1121      PT LONG TERM GOAL #1   Title  Patient will tolerate static standing for > 5 minutes for standing  ADL performance with weight acceptance onto prosthetic limb to improve quality of life and mobility.    Baseline  10/7: 1 minute 39 seconds with heavy UE support on RW 11/23: 1 minute 50 seconds static stand terminated due to back pain not knees. , 3 minutes of walking/dynamic standing. 11/29/19: 3 min 28 second due to back pain    Time  8    Period  Weeks    Status  Partially Met    Target Date  01/24/20      PT LONG TERM GOAL #2   Title  Patient will ambulate >100 feet with RW, mod I with right prosthetic exhibiting reciprocal gait pattern with good  management of prosthetic and good safety awareness for improved home ambulation    Baseline  10/7: unable to ambulate 11/23: 86 ft with RW 11/29/19: able to ambulate 100 ft with RW    Time  8    Period  Weeks    Status  Achieved      PT LONG TERM GOAL #3   Title  Patient (< 82 years old) will complete five times sit to stand test in < 10 seconds indicating an increased LE strength and improved balance.    Baseline  10/7: 21 seconds with heavy BUE support from raised surface 11/23: 16 seconds with BUE support and RW 1/25; 14 seconds with one hand on chair one hand on RW    Time  8    Period  Weeks    Status  Partially Met    Target Date  01/24/20      PT LONG TERM GOAL #4   Title  Patient will increase BLE gross strength to 4+/5 as to improve functional strength for independent gait, increased standing tolerance and increased ADL ability.    Baseline  10/7: L 3+/5 R 4-/5 11/23: L 4-/5 R 4/5 with knee 4-/5 due to pain 1/25: L 4/5 gross with 3/5 IR R 4+/5 gross with 4/5 ext/flex 3+/IR    Time  8    Period  Weeks    Status  Partially Met    Target Date  01/24/20      PT LONG TERM GOAL #5   Title  Patient will ambulate 50 ft with a quad cane for with CGA or less assistance for ambulating around house and safe performance of ADLs/iADLs.    Baseline  1/25: requires use of quad cane in // bars; unable to ambulate outside of // bars    Time  8     Period  Weeks    Status  New    Target Date  01/24/20            Plan - 11/29/19 1238    Clinical Impression Statement  Patient is progressing with functional goals at this time with increased standing and ambulatory capacity with assistive device. She is progressing with increasing independence with mobility with least restrictive AD with recent sessions focusing on use of quad cane. Continued use of UE for mobility and stability is required however patient is demonstrating improved weight acceptance with decreased compensatory mechanisms. Patient's condition has the potential to improve in response to therapy. Maximum improvement is yet to be obtained. The anticipated improvement is attainable and reasonable in a generally predictable time. Pt continues to respond well to PT, and can benefit further from skilled PT intervention to improve gait mechanics with AD, transfers, balance, and strength.    Personal Factors and Comorbidities  Age;Comorbidity 3+;Education;Finances;Fitness;Past/Current Experience;Social Background;Time since onset of injury/illness/exacerbation;Other    Comorbidities  anemia, anxiety, arthritis, back pain, bradycardia, CHF, CKD stage III, DM, diabetic nephropathy, dyspnea, GERD, heart murmer, HLD, hyperparathyroidism, HTN, blind in L eye, obesity, oncychomycosis    Examination-Activity Limitations  Bathing;Bed Mobility;Caring for Others;Bend;Carry;Dressing;Continence;Hygiene/Grooming;Stairs;Squat;Reach Overhead;Locomotion Level;Lift;Stand;Toileting;Transfers    Examination-Participation Restrictions  Church;Cleaning;Community Activity;Interpersonal Relationship;Driving;Laundry;Volunteer;Shop;Personal Finances;Meal Prep;Yard Work;Other    Stability/Clinical  Decision Making  Evolving/Moderate complexity    Rehab Potential  Fair    PT Frequency  2x / week    PT Duration  8 weeks    PT Treatment/Interventions  ADLs/Self Care Home Management;Aquatic  Therapy;Biofeedback;Cryotherapy;Electrical Stimulation;Iontophoresis 41m/ml Dexamethasone;Moist Heat;Traction;Ultrasound;Therapeutic activities;Functional mobility training;Stair training;Gait training;DME Instruction;Therapeutic exercise;Balance training;Neuromuscular re-education;Patient/family education;Manual techniques;Wheelchair mTax adviserCompression bandaging;Scar mobilization;Passive range of motion;Dry needling;Energy conservation;Splinting;Taping    PT Next Visit Plan  progress note    PT Home Exercise Plan  prone 10 minutes a day, wear prosthesis 1 hour a day, side lie posterior kick backs 10x    Consulted and Agree with Plan of Care  Patient       Patient will benefit from skilled therapeutic intervention in order to improve the following deficits and impairments:  Abnormal gait, Cardiopulmonary status limiting activity, Decreased activity tolerance, Decreased balance, Decreased knowledge of precautions, Decreased endurance, Decreased coordination, Decreased knowledge of use of DME, Decreased mobility, Decreased range of motion, Decreased safety awareness, Difficulty walking, Decreased strength, Hypomobility, Impaired flexibility, Impaired perceived functional ability, Impaired sensation, Obesity, Prosthetic Dependency, Postural dysfunction, Improper body mechanics, Pain  Visit Diagnosis: Other abnormalities of gait and mobility  Muscle weakness (generalized)  Unsteadiness on feet     Problem List Patient Active Problem List   Diagnosis Date Noted  . Acidosis 09/20/2019  . Benign hypertensive kidney disease with chronic kidney disease 09/20/2019  . Proteinuria 09/20/2019  . Secondary hyperparathyroidism of renal origin ([place] 09/20/2019  . Cataract 09/02/2019  . Chronic pain syndrome 07/13/2019  . Wound dehiscence 05/29/2019  . Hx of BKA, left ([place] 05/24/2019  . PVD (peripheral vascular disease) ([place] 05/13/2019  . Chronic osteomyelitis of ankle  and foot, left ([place] 05/05/2019  . Chronic diastolic CHF (congestive heart failure) ([place] 04/19/2019  . Leg edema 04/19/2019  . Acute bacterial endocarditis 04/19/2019  . Chest pain of uncertain etiology 065/99/3570 . Ulcer of left ankle ([place] 04/14/2019  . Ankle deformity, left 01/21/2019  . Diabetic polyneuropathy associated with type 2 diabetes mellitus ([facility] 01/21/2019  . Aortic valve endocarditis   . Bacteremia due to Streptococcus 12/24/2018  . Diabetic ulcer of left ankle associated with diabetes mellitus due to underlying condition, with fat layer exposed ([facility] 11/09/2018  . Malignant hypertension (arteriolar nephrosclerosis), stage 1-4 or unspecified chronic kidney disease 07/15/2018  . Stage 4 chronic kidney disease ([place] 07/15/2018  . Morbid obesity with BMI of 45.0-49.9, adult ([facility] 07/15/2018  . Excessive daytime sleepiness 07/15/2018  . Other fatigue 03/11/2018  . Essential hypertension 03/11/2018  . Vitamin D deficiency 03/11/2018  . Congestive heart failure ([place] 03/11/2018  . Primary osteoarthritis of right knee 01/06/2018  . Uncontrolled type 2 diabetes mellitus with polyneuropathy ([place] 10/03/2017  . Chronic pain of right knee 10/02/2017  . Obesity 10/02/2017  . Charcot's joint of left foot 08/29/2017  . Lymphedema 09/06/2016  . Hyponatremia with extracellular fluid depletion 02/02/2016  . Nausea with vomiting, unspecified 02/02/2016  . Closed nondisplaced fracture of right patella 08/23/2015  . Acute cystitis without hematuria 08/14/2014  . Colitis 08/14/2014  . HTN (hypertension), malignant 08/14/2014  . Acute renal failure superimposed on stage 3 chronic kidney disease ([facility] 06/26/2014  . Hyperparathyroidism, unspecified ([facility] 02/24/2014  . Allergic rhinitis 02/22/2014  . Onychomycosis 02/22/2014  . Anxiety 11/05/2013  . Bradycardia 05/14/2013  . Gastro-esophageal reflux disease without esophagitis 04/05/2013  . Anemia in other chronic diseases classified  elsewhere 01/27/2013   MJanna Arch PT, DPT   11/29/2019, 12:40 PM  [place]  [place] MAIN [facility] SERVICES [address] [place], [place], [address] Phone: [phone]   Fax:  [phone]  Name: KESLYN TEATER MRN: [id number] Date of Birth: 1961/01/16

## 2019-12-01 ENCOUNTER — Other Ambulatory Visit: Payer: Self-pay

## 2019-12-01 ENCOUNTER — Ambulatory Visit: Payer: Medicare HMO

## 2019-12-01 DIAGNOSIS — M6281 Muscle weakness (generalized): Secondary | ICD-10-CM

## 2019-12-01 DIAGNOSIS — R2689 Other abnormalities of gait and mobility: Secondary | ICD-10-CM | POA: Diagnosis not present

## 2019-12-01 DIAGNOSIS — R2681 Unsteadiness on feet: Secondary | ICD-10-CM

## 2019-12-01 NOTE — Therapy (Signed)
[place] MAIN [facility] SERVICES [address] [place], [place], [address] Phone: [phone]   Fax:  [phone]  Physical Therapy Treatment  Patient Details  Name: Rachel Holmes MRN: [id number] Date of Birth: [birth year] Referring Provider (PT): Eulogio Ditch    Encounter Date: 12/01/2019  PT End of Session - 12/01/19 1236    Visit Number  21    Number of Visits  36    Date for PT Re-Evaluation  01/24/20    Authorization Type  1/10 PN on 11/29/19    PT Start Time  1100    PT Stop Time  1141    PT Time Calculation (min)  41 min    Equipment Utilized During Treatment  Gait belt    Activity Tolerance  Patient tolerated treatment well;Patient limited by fatigue;Patient limited by pain    Behavior During Therapy  WFL for tasks assessed/performed       Past Medical History:  Diagnosis Date  . Allergic rhinitis   . Allergy   . Anemia   . Anxiety   . Arthritis   . Back pain   . Bradycardia   . Breast mass    Patient can no longer palpate specific masses but showed tech general area of concern  . CHF (congestive heart failure) ([place])   . CKD (chronic kidney disease)    STAGE 3  . Constipation   . Diabetes mellitus without complication ([place])   . Diabetic nephropathy ([place])   . Dyspnea   . GERD (gastroesophageal reflux disease)   . Heart murmur   . HLD (hyperlipidemia)   . Hyperparathyroidism ([place])   . Hypertension   . Joint pain   . Leg edema   . Legally blind in left eye, as defined in [place]   . Lymphedema   . Obesity   . Onychomycosis     Past Surgical History:  Procedure Laterality Date  . ABDOMINAL HYSTERECTOMY    . AMPUTATION Left 05/05/2019   Procedure: AMPUTATION BELOW KNEE;  Surgeon: Katha Cabal, MD;  Location: [facility] ORS;  Service: Vascular;  Laterality: Left;  . BREAST BIOPSY Left 2014   FNA 12:00 position - Negative  . EYE SURGERY Left 2007   removed a lens, no lens implanted  . IR FLUORO GUIDE CV LINE RIGHT   12/29/2018  . IR REMOVAL TUN CV CATH W/O FL  02/19/2019  . IR US GUIDE VASC ACCESS RIGHT  12/29/2018  . TEE WITHOUT CARDIOVERSION N/A 12/28/2018   Procedure: TRANSESOPHAGEAL ECHOCARDIOGRAM (TEE);  Surgeon: Sueanne Margarita, MD;  Location: [facility] ENDOSCOPY;  Service: Cardiovascular;  Laterality: N/A;  . VAGINAL HYSTERECTOMY     abdominal hyst, not a vaginal hyst    There were no vitals filed for this visit.  Subjective Assessment - 12/01/19 1233    Subjective  Patient reports feeling tired but ok today. Is having a harder time with her right knee. Did not wear her prosthetic leg yesterday.    Patient is accompained by:  --   ex husband   Pertinent History  Patient is a pleasant 59 year old female who presents for gait training/ stability for L BKA. Recently obtained her prosthesis last Friday 08/06/19 and has not worn it since the prosthetists office. PMH includes anemia, anxiety, arthritis, back pain, bradycardia, CHF, CKD stage II, DM, diabetic nephropathy, dyspnea, GERD, HLD, hyperparathyroidism, hypotension, legally blind in left eye, lymphedema, obesity, onychomycosis. Left BKA performed 05/05/19, noted in history that patient had  a fall after procedure injuring the anterior portion of the suture line. Currently using a manual chair to get around the home. At this time she can't get into bathroom due to wheelchair, is waiting for a handicap apartment.    Limitations  Lifting;Standing;Walking;House hold activities    How long can you sit comfortably?  n/a    How long can you stand comfortably?  only stood once with prosthetist    How long can you walk comfortably?  not able to walk yet.    Patient Stated Goals  to walk again with prosthesis    Currently in Pain?  Yes    Pain Score  3     Pain Location  Knee    Pain Orientation  Right    Pain Descriptors / Indicators  Aching    Pain Type  Chronic pain    Pain Onset  More than a month ago    Pain Frequency  Constant        Treatment:  patient  requires doffing of green sock and donning of yellow sock for proper fit of prosthetic limb due to initially wearing yellow +green, proper fit with yellow +yellow    In // bars:   Weight shift progressing into marching in // bar for assessment of fit of prosthetic limb for safe progression of interventions. Re-adjustment and application of R knee brace required.    Agility ladder; one foot in each square for widened BOS , initially with BUE support, cueing for large step length and close CGA  2x length of // bars with seated rest break after each 2 lengths. 3 sets of 2 lengths.   Airex narrow BOS with head turns left and right, 30 seconds, with decreasing UE support . Horizontal head turns resulted in dizziness ; vertical head turns 30 seconds no dizziness   outside of // bars with quad cane:  standing with QC with two chairs on either side of patient to prevent circumduction. Step forward and backwards 10x each LE. Patient unable to straighten R knee with weightbearing resultant in stability  Seated hamstring stretch on 6" step 30 seconds single LE at a time x2 trials  LAQ with pertubation's in full extension 20 seconds each LE, 2 sets each LE   RTB row 15x  Supine:  SLR each LE 10x tactile cue for height of movement and verbal cueing for knee extension  Straight leg abduction/adduction; 10x each LE, cueing for neutral foot alignment bilaterally   RTB adduction single LE at a time 12x each LE     Pt educated throughout session about proper posture and technique with exercises. Improved exercise technique, movement at target joints, use of target muscles after min to mod verbal, visual, tactile cues.      PT Education - 12/01/19 1236    Education Details  exercise technique, body mechanics    Person(s) Educated  Patient    Methods  Explanation;Demonstration;Tactile cues;Verbal cues    Comprehension  Verbalized understanding;Returned demonstration;Verbal cues required;Tactile  cues required       PT Short Term Goals - 11/29/19 1135      PT SHORT TERM GOAL #1   Title  Patient will be independent in home exercise program to improve strength/mobility for better functional independence with ADLs.    Baseline  10/7: HEP given; 11/23: HEP compliant 1/25: HEP compliant, progressing with increasing challenge of HEP    Time  2    Period  Weeks    Status  Partially Met    Target Date  12/13/19        PT Long Term Goals - 11/29/19 1121      PT LONG TERM GOAL #1   Title  Patient will tolerate static standing for > 5 minutes for standing ADL performance with weight acceptance onto prosthetic limb to improve quality of life and mobility.    Baseline  10/7: 1 minute 39 seconds with heavy UE support on RW 11/23: 1 minute 50 seconds static stand terminated due to back pain not knees. , 3 minutes of walking/dynamic standing. 11/29/19: 3 min 28 second due to back pain    Time  8    Period  Weeks    Status  Partially Met    Target Date  01/24/20      PT LONG TERM GOAL #2   Title  Patient will ambulate >100 feet with RW, mod I with right prosthetic exhibiting reciprocal gait pattern with good  management of prosthetic and good safety awareness for improved home ambulation    Baseline  10/7: unable to ambulate 11/23: 86 ft with RW 11/29/19: able to ambulate 100 ft with RW    Time  8    Period  Weeks    Status  Achieved      PT LONG TERM GOAL #3   Title  Patient (< 31 years old) will complete five times sit to stand test in < 10 seconds indicating an increased LE strength and improved balance.    Baseline  10/7: 21 seconds with heavy BUE support from raised surface 11/23: 16 seconds with BUE support and RW 1/25; 14 seconds with one hand on chair one hand on RW    Time  8    Period  Weeks    Status  Partially Met    Target Date  01/24/20      PT LONG TERM GOAL #4   Title  Patient will increase BLE gross strength to 4+/5 as to improve functional strength for independent  gait, increased standing tolerance and increased ADL ability.    Baseline  10/7: L 3+/5 R 4-/5 11/23: L 4-/5 R 4/5 with knee 4-/5 due to pain 1/25: L 4/5 gross with 3/5 IR R 4+/5 gross with 4/5 ext/flex 3+/IR    Time  8    Period  Weeks    Status  Partially Met    Target Date  01/24/20      PT LONG TERM GOAL #5   Title  Patient will ambulate 50 ft with a quad cane for with CGA or less assistance for ambulating around house and safe performance of ADLs/iADLs.    Baseline  1/25: requires use of quad cane in // bars; unable to ambulate outside of // bars    Time  8    Period  Weeks    Status  New    Target Date  01/24/20            Plan - 12/01/19 1244    Clinical Impression Statement  Patient presents with increased flexion of right (non-prosthetic) limb in weightbearing reducing her stability in standing and requiring transition to nonweightbearing interventions. Patient continues to demonstrate excellent care and management of prosthetic LLE with good understanding of proper fit after re-education. Patient will benefit from skilled physical therapy for ambulation and transfers with prosthesis, strength and stability, and increase stability with functional mobility for quality of life and decreased falls risk    Personal Factors and  Comorbidities  Age;Comorbidity 3+;Education;Finances;Fitness;Past/Current Experience;Social Background;Time since onset of injury/illness/exacerbation;Other    Comorbidities  anemia, anxiety, arthritis, back pain, bradycardia, CHF, CKD stage III, DM, diabetic nephropathy, dyspnea, GERD, heart murmer, HLD, hyperparathyroidism, HTN, blind in L eye, obesity, oncychomycosis    Examination-Activity Limitations  Bathing;Bed Mobility;Caring for Others;Bend;Carry;Dressing;Continence;Hygiene/Grooming;Stairs;Squat;Reach Overhead;Locomotion Level;Lift;Stand;Toileting;Transfers    Examination-Participation Restrictions  Church;Cleaning;Community Activity;Interpersonal  Relationship;Driving;Laundry;Volunteer;Shop;Personal Finances;Meal Prep;Yard Work;Other    Stability/Clinical Decision Making  Evolving/Moderate complexity    Rehab Potential  Fair    PT Frequency  2x / week    PT Duration  8 weeks    PT Treatment/Interventions  ADLs/Self Care Home Management;Aquatic Therapy;Biofeedback;Cryotherapy;Electrical Stimulation;Iontophoresis 31m/ml Dexamethasone;Moist Heat;Traction;Ultrasound;Therapeutic activities;Functional mobility training;Stair training;Gait training;DME Instruction;Therapeutic exercise;Balance training;Neuromuscular re-education;Patient/family education;Manual techniques;Wheelchair mTax adviserCompression bandaging;Scar mobilization;Passive range of motion;Dry needling;Energy conservation;Splinting;Taping    PT Next Visit Plan  walk with QC    PT Home Exercise Plan  prone 10 minutes a day, wear prosthesis 1 hour a day, side lie posterior kick backs 10x    Consulted and Agree with Plan of Care  Patient       Patient will benefit from skilled therapeutic intervention in order to improve the following deficits and impairments:  Abnormal gait, Cardiopulmonary status limiting activity, Decreased activity tolerance, Decreased balance, Decreased knowledge of precautions, Decreased endurance, Decreased coordination, Decreased knowledge of use of DME, Decreased mobility, Decreased range of motion, Decreased safety awareness, Difficulty walking, Decreased strength, Hypomobility, Impaired flexibility, Impaired perceived functional ability, Impaired sensation, Obesity, Prosthetic Dependency, Postural dysfunction, Improper body mechanics, Pain  Visit Diagnosis: Other abnormalities of gait and mobility  Muscle weakness (generalized)  Unsteadiness on feet     Problem List Patient Active Problem List   Diagnosis Date Noted  . Acidosis 09/20/2019  . Benign hypertensive kidney disease with chronic kidney disease 09/20/2019  .  Proteinuria 09/20/2019  . Secondary hyperparathyroidism of renal origin ([place] 09/20/2019  . Cataract 09/02/2019  . Chronic pain syndrome 07/13/2019  . Wound dehiscence 05/29/2019  . Hx of BKA, left ([place] 05/24/2019  . PVD (peripheral vascular disease) ([place] 05/13/2019  . Chronic osteomyelitis of ankle and foot, left ([place] 05/05/2019  . Chronic diastolic CHF (congestive heart failure) ([place] 04/19/2019  . Leg edema 04/19/2019  . Acute bacterial endocarditis 04/19/2019  . Chest pain of uncertain etiology 035/70/1779 . Ulcer of left ankle ([facility] 04/14/2019  . Ankle deformity, left 01/21/2019  . Diabetic polyneuropathy associated with type 2 diabetes mellitus ([place] 01/21/2019  . Aortic valve endocarditis   . Bacteremia due to Streptococcus 12/24/2018  . Diabetic ulcer of left ankle associated with diabetes mellitus due to underlying condition, with fat layer exposed ([place] 11/09/2018  . Malignant hypertension (arteriolar nephrosclerosis), stage 1-4 or unspecified chronic kidney disease 07/15/2018  . Stage 4 chronic kidney disease ([place] 07/15/2018  . Morbid obesity with BMI of 45.0-49.9, adult ([place] 07/15/2018  . Excessive daytime sleepiness 07/15/2018  . Other fatigue 03/11/2018  . Essential hypertension 03/11/2018  . Vitamin D deficiency 03/11/2018  . Congestive heart failure ([place] 03/11/2018  . Primary osteoarthritis of right knee 01/06/2018  . Uncontrolled type 2 diabetes mellitus with polyneuropathy ([place] 10/03/2017  . Chronic pain of right knee 10/02/2017  . Obesity 10/02/2017  . Charcot's joint of left foot 08/29/2017  . Lymphedema 09/06/2016  . Hyponatremia with extracellular fluid depletion 02/02/2016  . Nausea with vomiting, unspecified 02/02/2016  . Closed nondisplaced fracture of right patella 08/23/2015  . Acute cystitis without hematuria 08/14/2014  . Colitis 08/14/2014  . HTN (hypertension), malignant  08/14/2014  . Acute renal failure superimposed on stage 3 chronic  kidney disease ([place]) 06/26/2014  . Hyperparathyroidism, unspecified ([place]) 02/24/2014  . Allergic rhinitis 02/22/2014  . Onychomycosis 02/22/2014  . Anxiety 11/05/2013  . Bradycardia 05/14/2013  . Gastro-esophageal reflux disease without esophagitis 04/05/2013  . Anemia in other chronic diseases classified elsewhere 01/27/2013   Janna Arch, PT, DPT   12/01/2019, 12:47 PM  [place] [facility] SERVICES [address] [place], [place], [address] Phone: [phone]   Fax:  [phone]  Name: Rachel Holmes MRN: [id number] Date of Birth: 1961-07-27

## 2019-12-06 ENCOUNTER — Other Ambulatory Visit: Payer: Self-pay

## 2019-12-06 ENCOUNTER — Ambulatory Visit: Payer: Medicare Other | Attending: Nurse Practitioner

## 2019-12-06 DIAGNOSIS — M6281 Muscle weakness (generalized): Secondary | ICD-10-CM | POA: Diagnosis present

## 2019-12-06 DIAGNOSIS — R2689 Other abnormalities of gait and mobility: Secondary | ICD-10-CM | POA: Insufficient documentation

## 2019-12-06 DIAGNOSIS — R2681 Unsteadiness on feet: Secondary | ICD-10-CM | POA: Diagnosis present

## 2019-12-06 NOTE — Therapy (Signed)
[facility] [address] [place], [place], [address] Phone: [phone]   Fax:  [phone]  Physical Therapy Treatment  Patient Details  Name: Rachel Holmes MRN: [id number] Date of Birth: 02/07/1961 Referring Provider (PT): Brown, Fallon    Encounter Date: 12/06/2019  PT End of Session - 12/06/19 1138    Visit Number  22    Number of Visits  36    Date for PT Re-Evaluation  01/24/20    Authorization Type  2/10 PN on 11/29/19    PT Start Time  1058    PT Stop Time  1142    PT Time Calculation (min)  44 min    Equipment Utilized During Treatment  Gait belt    Activity Tolerance  Patient tolerated treatment well;Patient limited by fatigue;Patient limited by pain    Behavior During Therapy  WFL for tasks assessed/performed       Past Medical History:  Diagnosis Date  . Allergic rhinitis   . Allergy   . Anemia   . Anxiety   . Arthritis   . Back pain   . Bradycardia   . Breast mass    Patient can no longer palpate specific masses but showed tech general area of concern  . CHF (congestive heart failure) (HCC)   . CKD (chronic kidney disease)    STAGE 3  . Constipation   . Diabetes mellitus without complication (HCC)   . Diabetic nephropathy (HCC)   . Dyspnea   . GERD (gastroesophageal reflux disease)   . Heart murmur   . HLD (hyperlipidemia)   . Hyperparathyroidism (HCC)   . Hypertension   . Joint pain   . Leg edema   . Legally blind in left eye, as defined in [place]   . Lymphedema   . Obesity   . Onychomycosis     Past Surgical History:  Procedure Laterality Date  . ABDOMINAL HYSTERECTOMY    . AMPUTATION Left 05/05/2019   Procedure: AMPUTATION BELOW KNEE;  Surgeon: Schnier, Gregory G, MD;  Location: [facility] ORS;  Service: Vascular;  Laterality: Left;  . BREAST BIOPSY Left 2014   FNA 12:00 position - Negative  . EYE SURGERY Left 2007   removed a lens, no lens implanted  . IR FLUORO GUIDE CV LINE RIGHT   12/29/2018  . IR REMOVAL TUN CV CATH W/O FL  02/19/2019  . IR US GUIDE VASC ACCESS RIGHT  12/29/2018  . TEE WITHOUT CARDIOVERSION N/A 12/28/2018   Procedure: TRANSESOPHAGEAL ECHOCARDIOGRAM (TEE);  Surgeon: Turner, Traci R, MD;  Location: [facility];  Service: Cardiovascular;  Laterality: N/A;  . VAGINAL HYSTERECTOMY     abdominal hyst, not a vaginal hyst    There were no vitals filed for this visit.  Subjective Assessment - 12/06/19 1103    Subjective  Patient reports no falls or LOB sine last session. No pain, Has been compliant with HEP    Patient is accompained by:  --   ex husband   Pertinent History  Patient is a pleasant 58 year old female who presents for gait training/ stability for L BKA. Recently obtained her prosthesis last Friday 08/06/19 and has not worn it since the prosthetists office. PMH includes anemia, anxiety, arthritis, back pain, bradycardia, CHF, CKD stage II, DM, diabetic nephropathy, dyspnea, GERD, HLD, hyperparathyroidism, hypotension, legally blind in left eye, lymphedema, obesity, onychomycosis. Left BKA performed 05/05/19, noted in history that patient had a fall after procedure injuring the anterior   portion of the suture line. Currently using a manual chair to get around the home. At this time she can't get into bathroom due to wheelchair, is waiting for a handicap apartment.    Limitations  Lifting;Standing;Walking;House hold activities    How long can you sit comfortably?  n/a    How long can you stand comfortably?  only stood once with prosthetist    How long can you walk comfortably?  not able to walk yet.    Patient Stated Goals  to walk again with prosthesis    Currently in Pain?  No/denies       Vitals prior to session start; 129/61 pulse 67    CGA in // bars: -Front walk, 3 lengths of // bars -Side step, 2 lengths of // bars, cued for upright posture and pushing with legs instead of arms -Ambulating with cane to translate to use of AD in daily life,x 12  lengths of // bars, cueing for cane and foot placement; seated rest breaks after every 4 lengths of // bars.   Standing with RW:  -Ambulate 96 ft with bariatric RW and CGA with cues for upright posture and decreased UE support x2 trials; very fatigued by end of session  -slow marches with focus on upright posture and decreasing UE support   Seated:  -Hamstring stretch on 6" step, 30 sec each LE; patient reports reduced pain/cramping after stretch -LAQ 5lbs, 10x each LE, 3-5 sec holds at ext, pt paused between reps -Marches 5lbs, 10x each LE, 3 sec concentric and 3 sec eccentric,  -BTB resisted adduction, 10x each LE -Scap retract to assist with reported neck pain, 10x, cueing for keeping chin upright, squeezing between shoulder blades, and keeping shoulders relaxed  Patient requires cueing via tactile, verbal, and demonstration for stability and mobility with active interventions.                      PT Education - 12/06/19 1103    Education Details  exercise technique, body mechanics    Person(s) Educated  Patient    Methods  Explanation;Demonstration;Tactile cues;Verbal cues    Comprehension  Verbalized understanding;Returned demonstration;Verbal cues required;Tactile cues required       PT Short Term Goals - 11/29/19 1135      PT SHORT TERM GOAL #1   Title  Patient will be independent in home exercise program to improve strength/mobility for better functional independence with ADLs.    Baseline  10/7: HEP given; 11/23: HEP compliant 1/25: HEP compliant, progressing with increasing challenge of HEP    Time  2    Period  Weeks    Status  Partially Met    Target Date  12/13/19        PT Long Term Goals - 11/29/19 1121      PT LONG TERM GOAL #1   Title  Patient will tolerate static standing for > 5 minutes for standing ADL performance with weight acceptance onto prosthetic limb to improve quality of life and mobility.    Baseline  10/7: 1 minute 39 seconds  with heavy UE support on RW 11/23: 1 minute 50 seconds static stand terminated due to back pain not knees. , 3 minutes of walking/dynamic standing. 11/29/19: 3 min 28 second due to back pain    Time  8    Period  Weeks    Status  Partially Met    Target Date  01/24/20      PT LONG   TERM GOAL #2   Title  Patient will ambulate >100 feet with RW, mod I with right prosthetic exhibiting reciprocal gait pattern with good  management of prosthetic and good safety awareness for improved home ambulation    Baseline  10/7: unable to ambulate 11/23: 86 ft with RW 11/29/19: able to ambulate 100 ft with RW    Time  8    Period  Weeks    Status  Achieved      PT LONG TERM GOAL #3   Title  Patient (< 37 years old) will complete five times sit to stand test in < 10 seconds indicating an increased LE strength and improved balance.    Baseline  10/7: 21 seconds with heavy BUE support from raised surface 11/23: 16 seconds with BUE support and RW 1/25; 14 seconds with one hand on chair one hand on RW    Time  8    Period  Weeks    Status  Partially Met    Target Date  01/24/20      PT LONG TERM GOAL #4   Title  Patient will increase BLE gross strength to 4+/5 as to improve functional strength for independent gait, increased standing tolerance and increased ADL ability.    Baseline  10/7: L 3+/5 R 4-/5 11/23: L 4-/5 R 4/5 with knee 4-/5 due to pain 1/25: L 4/5 gross with 3/5 IR R 4+/5 gross with 4/5 ext/flex 3+/IR    Time  8    Period  Weeks    Status  Partially Met    Target Date  01/24/20      PT LONG TERM GOAL #5   Title  Patient will ambulate 50 ft with a quad cane for with CGA or less assistance for ambulating around house and safe performance of ADLs/iADLs.    Baseline  1/25: requires use of quad cane in // bars; unable to ambulate outside of // bars    Time  8    Period  Weeks    Status  New    Target Date  01/24/20            Plan - 12/06/19 1139    Clinical Impression Statement  Patient  presents to physical therapy with excellent motivation. She continues to progress with capacity for mobility with decreasing need for seated rest break between standing interventions.Continued use of UE for mobility and stability is required however patient is demonstrating improved weight acceptance with decreased compensatory mechanisms Patient will benefit from skilled physical therapy for ambulation and transfers with prosthesis, strength and stability, and increase stability with functional mobility for quality of life and decreased falls risk    Personal Factors and Comorbidities  Age;Comorbidity 3+;Education;Finances;Fitness;Past/Current Experience;Social Background;Time since onset of injury/illness/exacerbation;Other    Comorbidities  anemia, anxiety, arthritis, back pain, bradycardia, CHF, CKD stage III, DM, diabetic nephropathy, dyspnea, GERD, heart murmer, HLD, hyperparathyroidism, HTN, blind in L eye, obesity, oncychomycosis    Examination-Activity Limitations  Bathing;Bed Mobility;Caring for Others;Bend;Carry;Dressing;Continence;Hygiene/Grooming;Stairs;Squat;Reach Overhead;Locomotion Level;Lift;Stand;Toileting;Transfers    Examination-Participation Restrictions  Church;Cleaning;Community Activity;Interpersonal Relationship;Driving;Laundry;Volunteer;Shop;Personal Finances;Meal Prep;Yard Work;Other    Stability/Clinical Decision Making  Evolving/Moderate complexity    Rehab Potential  Fair    PT Frequency  2x / week    PT Duration  8 weeks    PT Treatment/Interventions  ADLs/Self Care Home Management;Aquatic Therapy;Biofeedback;Cryotherapy;Electrical Stimulation;Iontophoresis 68m/ml Dexamethasone;Moist Heat;Traction;Ultrasound;Therapeutic activities;Functional mobility training;Stair training;Gait training;DME Instruction;Therapeutic exercise;Balance training;Neuromuscular re-education;Patient/family education;Manual techniques;Wheelchair mTax adviserCompression  bandaging;Scar mobilization;Passive range of motion;Dry needling;Energy conservation;Splinting;Taping  PT Next Visit Plan  walk with QC    PT Home Exercise Plan  prone 10 minutes a day, wear prosthesis 1 hour a day, side lie posterior kick backs 10x    Consulted and Agree with Plan of Care  Patient       Patient will benefit from skilled therapeutic intervention in order to improve the following deficits and impairments:  Abnormal gait, Cardiopulmonary status limiting activity, Decreased activity tolerance, Decreased balance, Decreased knowledge of precautions, Decreased endurance, Decreased coordination, Decreased knowledge of use of DME, Decreased mobility, Decreased range of motion, Decreased safety awareness, Difficulty walking, Decreased strength, Hypomobility, Impaired flexibility, Impaired perceived functional ability, Impaired sensation, Obesity, Prosthetic Dependency, Postural dysfunction, Improper body mechanics, Pain  Visit Diagnosis: Other abnormalities of gait and mobility  Muscle weakness (generalized)  Unsteadiness on feet     Problem List Patient Active Problem List   Diagnosis Date Noted  . Acidosis 09/20/2019  . Benign hypertensive kidney disease with chronic kidney disease 09/20/2019  . Proteinuria 09/20/2019  . Secondary hyperparathyroidism of renal origin (HCC) 09/20/2019  . Cataract 09/02/2019  . Chronic pain syndrome 07/13/2019  . Wound dehiscence 05/29/2019  . Hx of BKA, left (HCC) 05/24/2019  . PVD (peripheral vascular disease) (HCC) 05/13/2019  . Chronic osteomyelitis of ankle and foot, left (HCC) 05/05/2019  . Chronic diastolic CHF (congestive heart failure) (HCC) 04/19/2019  . Leg edema 04/19/2019  . Acute bacterial endocarditis 04/19/2019  . Chest pain of uncertain etiology 04/19/2019  . Ulcer of left ankle (HCC) 04/14/2019  . Ankle deformity, left 01/21/2019  . Diabetic polyneuropathy associated with type 2 diabetes mellitus (HCC) 01/21/2019  .  Aortic valve endocarditis   . Bacteremia due to Streptococcus 12/24/2018  . Diabetic ulcer of left ankle associated with diabetes mellitus due to underlying condition, with fat layer exposed (HCC) 11/09/2018  . Malignant hypertension (arteriolar nephrosclerosis), stage 1-4 or unspecified chronic kidney disease 07/15/2018  . Stage 4 chronic kidney disease (HCC) 07/15/2018  . Morbid obesity with BMI of 45.0-49.9, adult (HCC) 07/15/2018  . Excessive daytime sleepiness 07/15/2018  . Other fatigue 03/11/2018  . Essential hypertension 03/11/2018  . Vitamin D deficiency 03/11/2018  . Congestive heart failure (HCC) 03/11/2018  . Primary osteoarthritis of right knee 01/06/2018  . Uncontrolled type 2 diabetes mellitus with polyneuropathy (HCC) 10/03/2017  . Chronic pain of right knee 10/02/2017  . Obesity 10/02/2017  . Charcot's joint of left foot 08/29/2017  . Lymphedema 09/06/2016  . Hyponatremia with extracellular fluid depletion 02/02/2016  . Nausea with vomiting, unspecified 02/02/2016  . Closed nondisplaced fracture of right patella 08/23/2015  . Acute cystitis without hematuria 08/14/2014  . Colitis 08/14/2014  . HTN (hypertension), malignant 08/14/2014  . Acute renal failure superimposed on stage 3 chronic kidney disease (HCC) 06/26/2014  . Hyperparathyroidism, unspecified (HCC) 02/24/2014  . Allergic rhinitis 02/22/2014  . Onychomycosis 02/22/2014  . Anxiety 11/05/2013  . Bradycardia 05/14/2013  . Gastro-esophageal reflux disease without esophagitis 04/05/2013  . Anemia in other chronic diseases classified elsewhere 01/27/2013   Marina Moser, PT, DPT   12/06/2019, 11:51 AM  [facility] [address] [place], [place], [address] Phone: [phone]   Fax:  [phone]  Name: Rachel Holmes MRN: [id number] Date of Birth: 11/23/1960   

## 2019-12-08 ENCOUNTER — Ambulatory Visit: Payer: Medicare Other

## 2019-12-13 ENCOUNTER — Encounter: Payer: Self-pay | Admitting: Student in an Organized Health Care Education/Training Program

## 2019-12-13 ENCOUNTER — Ambulatory Visit: Payer: Medicare Other

## 2019-12-13 ENCOUNTER — Ambulatory Visit
Payer: Medicare HMO | Attending: Student in an Organized Health Care Education/Training Program | Admitting: Student in an Organized Health Care Education/Training Program

## 2019-12-13 ENCOUNTER — Other Ambulatory Visit: Payer: Self-pay

## 2019-12-13 DIAGNOSIS — R2689 Other abnormalities of gait and mobility: Secondary | ICD-10-CM | POA: Diagnosis not present

## 2019-12-13 DIAGNOSIS — G8929 Other chronic pain: Secondary | ICD-10-CM

## 2019-12-13 DIAGNOSIS — M1711 Unilateral primary osteoarthritis, right knee: Secondary | ICD-10-CM

## 2019-12-13 DIAGNOSIS — G894 Chronic pain syndrome: Secondary | ICD-10-CM

## 2019-12-13 DIAGNOSIS — R2681 Unsteadiness on feet: Secondary | ICD-10-CM

## 2019-12-13 DIAGNOSIS — M6281 Muscle weakness (generalized): Secondary | ICD-10-CM

## 2019-12-13 NOTE — Therapy (Signed)
[place] MAIN [facility] SERVICES [address] [place], [place], [address] Phone: [phone]   Fax:  [phone]  Physical Therapy Treatment  Patient Details  Name: Rachel Holmes MRN: [id number] Date of Birth: 11/02/1961 Referring Provider (PT): Eulogio Ditch    Encounter Date: 12/13/2019  PT End of Session - 12/13/19 1004    Visit Number  23    Number of Visits  36    Date for PT Re-Evaluation  01/24/20    Authorization Type  3/10 PN on 11/29/19    PT Start Time  1016    PT Stop Time  1059    PT Time Calculation (min)  43 min    Equipment Utilized During Treatment  Gait belt    Activity Tolerance  Patient tolerated treatment well;Patient limited by fatigue;Patient limited by pain    Behavior During Therapy  [facility] for tasks assessed/performed       Past Medical History:  Diagnosis Date  . Allergic rhinitis   . Allergy   . Anemia   . Anxiety   . Arthritis   . Back pain   . Bradycardia   . Breast mass    Patient can no longer palpate specific masses but showed tech general area of concern  . CHF (congestive heart failure) ([place])   . CKD (chronic kidney disease)    STAGE 3  . Constipation   . Diabetes mellitus without complication ([place])   . Diabetic nephropathy ([place])   . Dyspnea   . GERD (gastroesophageal reflux disease)   . Heart murmur   . HLD (hyperlipidemia)   . Hyperparathyroidism ([place])   . Hypertension   . Joint pain   . Leg edema   . Legally blind in left eye, as defined in [place]   . Lymphedema   . Obesity   . Onychomycosis     Past Surgical History:  Procedure Laterality Date  . ABDOMINAL HYSTERECTOMY    . AMPUTATION Left 05/05/2019   Procedure: AMPUTATION BELOW KNEE;  Surgeon: Katha Cabal, MD;  Location: [facility] ORS;  Service: Vascular;  Laterality: Left;  . BREAST BIOPSY Left 2014   FNA 12:00 position - Negative  . EYE SURGERY Left 2007   removed a lens, no lens implanted  . IR FLUORO GUIDE CV LINE RIGHT   12/29/2018  . IR REMOVAL TUN CV CATH W/O FL  02/19/2019  . IR US GUIDE VASC ACCESS RIGHT  12/29/2018  . TEE WITHOUT CARDIOVERSION N/A 12/28/2018   Procedure: TRANSESOPHAGEAL ECHOCARDIOGRAM (TEE);  Surgeon: Sueanne Margarita, MD;  Location: [facility] ENDOSCOPY;  Service: Cardiovascular;  Laterality: N/A;  . VAGINAL HYSTERECTOMY     abdominal hyst, not a vaginal hyst    There were no vitals filed for this visit.  Subjective Assessment - 12/13/19 1020    Subjective  Patient reports L shoulder pain yesterday but no pain today. Has been walking between sessions, missed last visit due to having a conflict with a doctor appt. No falls or LOB since last session.    Patient is accompained by:  --   ex husband   Pertinent History  Patient is a pleasant 59 year old female who presents for gait training/ stability for L BKA. Recently obtained her prosthesis last Friday 08/06/19 and has not worn it since the prosthetists office. PMH includes anemia, anxiety, arthritis, back pain, bradycardia, CHF, CKD stage II, DM, diabetic nephropathy, dyspnea, GERD, HLD, hyperparathyroidism, hypotension, legally blind in left eye, lymphedema, obesity,  onychomycosis. Left BKA performed 05/05/19, noted in history that patient had a fall after procedure injuring the anterior portion of the suture line. Currently using a manual chair to get around the home. At this time she can't get into bathroom due to wheelchair, is waiting for a handicap apartment.    Limitations  Lifting;Standing;Walking;House hold activities    How long can you sit comfortably?  n/a    How long can you stand comfortably?  only stood once with prosthetist    How long can you walk comfortably?  not able to walk yet.    Patient Stated Goals  to walk again with prosthesis    Currently in Pain?  No/denies              Vitals prior to session start; 139/69 pulse 77   CGA in // bars:  -Front walk, 4 lengths of // bars  -Side step, 4 lengths of // bars, cued for  upright posture and pushing with legs instead of arms  -Ambulating with cane to translate to use of AD in daily life,x 12 lengths of // bars, cueing for cane and foot placement; seated rest breaks after every 2-4 lengths of // bars. Slight cramp in L hip   Standing with RW:  -Ambulate 96 ft with bariatric RW and CGA with cues for upright posture and decreased UE support x2 trials; very fatigued by end of session  -slow marches with focus on upright posture and decreasing UE support   Seated:  -Hamstring stretch on 6" step, 30 sec each LE; patient reports reduced pain/cramping after stretch  -BTB marches 10x each LE  -BTB resisted adduction, 10x each LE    Patient requires cueing via tactile, verbal, and demonstration for stability and mobility with active interventions.                            PT Education - 12/13/19 1004    Education Details  exercise technique, body mechanics    Person(s) Educated  Patient    Methods  Explanation;Demonstration;Tactile cues;Verbal cues    Comprehension  Verbalized understanding;Returned demonstration;Verbal cues required;Tactile cues required       PT Short Term Goals - 11/29/19 1135      PT SHORT TERM GOAL #1   Title  Patient will be independent in home exercise program to improve strength/mobility for better functional independence with ADLs.    Baseline  10/7: HEP given; 11/23: HEP compliant 1/25: HEP compliant, progressing with increasing challenge of HEP    Time  2    Period  Weeks    Status  Partially Met    Target Date  12/13/19        PT Long Term Goals - 11/29/19 1121      PT LONG TERM GOAL #1   Title  Patient will tolerate static standing for > 5 minutes for standing ADL performance with weight acceptance onto prosthetic limb to improve quality of life and mobility.    Baseline  10/7: 1 minute 39 seconds with heavy UE support on RW 11/23: 1 minute 50 seconds static stand terminated due to back pain not  knees. , 3 minutes of walking/dynamic standing. 11/29/19: 3 min 28 second due to back pain    Time  8    Period  Weeks    Status  Partially Met    Target Date  01/24/20      PT LONG TERM GOAL #  2   Title  Patient will ambulate >100 feet with RW, mod I with right prosthetic exhibiting reciprocal gait pattern with good  management of prosthetic and good safety awareness for improved home ambulation    Baseline  10/7: unable to ambulate 11/23: 86 ft with RW 11/29/19: able to ambulate 100 ft with RW    Time  8    Period  Weeks    Status  Achieved      PT LONG TERM GOAL #3   Title  Patient (< 12 years old) will complete five times sit to stand test in < 10 seconds indicating an increased LE strength and improved balance.    Baseline  10/7: 21 seconds with heavy BUE support from raised surface 11/23: 16 seconds with BUE support and RW 1/25; 14 seconds with one hand on chair one hand on RW    Time  8    Period  Weeks    Status  Partially Met    Target Date  01/24/20      PT LONG TERM GOAL #4   Title  Patient will increase BLE gross strength to 4+/5 as to improve functional strength for independent gait, increased standing tolerance and increased ADL ability.    Baseline  10/7: L 3+/5 R 4-/5 11/23: L 4-/5 R 4/5 with knee 4-/5 due to pain 1/25: L 4/5 gross with 3/5 IR R 4+/5 gross with 4/5 ext/flex 3+/IR    Time  8    Period  Weeks    Status  Partially Met    Target Date  01/24/20      PT LONG TERM GOAL #5   Title  Patient will ambulate 50 ft with a quad cane for with CGA or less assistance for ambulating around house and safe performance of ADLs/iADLs.    Baseline  1/25: requires use of quad cane in // bars; unable to ambulate outside of // bars    Time  8    Period  Weeks    Status  New    Target Date  01/24/20            Plan - 12/13/19 1050    Clinical Impression Statement  Patient presents to physical therapy with excellent motivation. She initially was challenged with  ambulation with quad cane in // bars due to not practicing with quad cane at home. Is improving with gait mechanics with RW when negotiating obstacles in the gym. She fatigues with prolonged standing with heavy reliance upon UE's for stability. Patient will benefit from skilled physical therapy for ambulation and transfers with prosthesis, strength and stability, and increase stability with functional mobility for quality of life and decreased falls risk    Personal Factors and Comorbidities  Age;Comorbidity 3+;Education;Finances;Fitness;Past/Current Experience;Social Background;Time since onset of injury/illness/exacerbation;Other    Comorbidities  anemia, anxiety, arthritis, back pain, bradycardia, CHF, CKD stage III, DM, diabetic nephropathy, dyspnea, GERD, heart murmer, HLD, hyperparathyroidism, HTN, blind in L eye, obesity, oncychomycosis    Examination-Activity Limitations  Bathing;Bed Mobility;Caring for Others;Bend;Carry;Dressing;Continence;Hygiene/Grooming;Stairs;Squat;Reach Overhead;Locomotion Level;Lift;Stand;Toileting;Transfers    Examination-Participation Restrictions  Church;Cleaning;Community Activity;Interpersonal Relationship;Driving;Laundry;Volunteer;Shop;Personal Finances;Meal Prep;Yard Work;Other    Stability/Clinical Decision Making  Evolving/Moderate complexity    Rehab Potential  Fair    PT Frequency  2x / week    PT Duration  8 weeks    PT Treatment/Interventions  ADLs/Self Care Home Management;Aquatic Therapy;Biofeedback;Cryotherapy;Electrical Stimulation;Iontophoresis 54m/ml Dexamethasone;Moist Heat;Traction;Ultrasound;Therapeutic activities;Functional mobility training;Stair training;Gait training;DME Instruction;Therapeutic exercise;Balance training;Neuromuscular re-education;Patient/family education;Manual techniques;Wheelchair mTax adviserCompression bandaging;Scar mobilization;Passive range  of motion;Dry needling;Energy conservation;Splinting;Taping     PT Next Visit Plan  walk with QC    PT Home Exercise Plan  prone 10 minutes a day, wear prosthesis 1 hour a day, side lie posterior kick backs 10x    Consulted and Agree with Plan of Care  Patient       Patient will benefit from skilled therapeutic intervention in order to improve the following deficits and impairments:  Abnormal gait, Cardiopulmonary status limiting activity, Decreased activity tolerance, Decreased balance, Decreased knowledge of precautions, Decreased endurance, Decreased coordination, Decreased knowledge of use of DME, Decreased mobility, Decreased range of motion, Decreased safety awareness, Difficulty walking, Decreased strength, Hypomobility, Impaired flexibility, Impaired perceived functional ability, Impaired sensation, Obesity, Prosthetic Dependency, Postural dysfunction, Improper body mechanics, Pain  Visit Diagnosis: Other abnormalities of gait and mobility  Muscle weakness (generalized)  Unsteadiness on feet     Problem List Patient Active Problem List   Diagnosis Date Noted  . Acidosis 09/20/2019  . Benign hypertensive kidney disease with chronic kidney disease 09/20/2019  . Proteinuria 09/20/2019  . Secondary hyperparathyroidism of renal origin ([place]) 09/20/2019  . Cataract 09/02/2019  . Chronic pain syndrome 07/13/2019  . Wound dehiscence 05/29/2019  . Hx of BKA, left ([place]) 05/24/2019  . PVD (peripheral vascular disease) ([place]) 05/13/2019  . Chronic osteomyelitis of ankle and foot, left ([place]) 05/05/2019  . Chronic diastolic CHF (congestive heart failure) ([place]) 04/19/2019  . Leg edema 04/19/2019  . Acute bacterial endocarditis 04/19/2019  . Chest pain of uncertain etiology 32/54/9826  . Ulcer of left ankle ([place]) 04/14/2019  . Ankle deformity, left 01/21/2019  . Diabetic polyneuropathy associated with type 2 diabetes mellitus ([place]) 01/21/2019  . Aortic valve endocarditis   . Bacteremia due to Streptococcus 12/24/2018  . Diabetic ulcer of left  ankle associated with diabetes mellitus due to underlying condition, with fat layer exposed ([place]) 11/09/2018  . Malignant hypertension (arteriolar nephrosclerosis), stage 1-4 or unspecified chronic kidney disease 07/15/2018  . Stage 4 chronic kidney disease ([place]) 07/15/2018  . Morbid obesity with BMI of 45.0-49.9, adult ([place]) 07/15/2018  . Excessive daytime sleepiness 07/15/2018  . Other fatigue 03/11/2018  . Essential hypertension 03/11/2018  . Vitamin D deficiency 03/11/2018  . Congestive heart failure ([place]) 03/11/2018  . Primary osteoarthritis of right knee 01/06/2018  . Uncontrolled type 2 diabetes mellitus with polyneuropathy ([place]) 10/03/2017  . Chronic pain of right knee 10/02/2017  . Obesity 10/02/2017  . Charcot's joint of left foot 08/29/2017  . Lymphedema 09/06/2016  . Hyponatremia with extracellular fluid depletion 02/02/2016  . Nausea with vomiting, unspecified 02/02/2016  . Closed nondisplaced fracture of right patella 08/23/2015  . Acute cystitis without hematuria 08/14/2014  . Colitis 08/14/2014  . HTN (hypertension), malignant 08/14/2014  . Acute renal failure superimposed on stage 3 chronic kidney disease ([place]) 06/26/2014  . Hyperparathyroidism, unspecified ([place]) 02/24/2014  . Allergic rhinitis 02/22/2014  . Onychomycosis 02/22/2014  . Anxiety 11/05/2013  . Bradycardia 05/14/2013  . Gastro-esophageal reflux disease without esophagitis 04/05/2013  . Anemia in other chronic diseases classified elsewhere 01/27/2013   Janna Arch, PT, DPT   12/13/2019, 12:01 PM  [place] MAIN [facility] SERVICES [address] [place], [place], [address] Phone: [phone]   Fax:  [phone]  Name: Rachel Holmes MRN: [id number] Date of Birth: 1961-03-14

## 2019-12-13 NOTE — Assessment & Plan Note (Signed)
Patient is status post right knee diagnostic genicular nerve block which provided her with greater than 75% pain relief for approximately 2 weeks.  She states that she is able to ambulate and bear weight with less pain.  She continues to work with physical therapy.  She endorses muscle cramps in her posterior knee region.  We discussed the next steps after diagnostic genicular nerve block which includes right genicular nerve radiofrequency ablation.  Risks and benefits of this procedure were discussed and patient like to proceed.

## 2019-12-13 NOTE — Progress Notes (Signed)
Patient: Rachel Holmes  Service Category: E/M  Provider: Gillis Santa, MD  DOB: [birth year]  DOS: 12/13/2019  Location: Office  MRN: [id number]  Setting: Ambulatory outpatient  Referring Provider: Sharyne Peach, MD  Type: Established Patient  Specialty: Interventional Pain Management  PCP: Sharyne Peach, MD  Location: Home  Delivery: TeleHealth     Virtual Encounter - Pain Management PROVIDER NOTE: Information contained herein reflects review and annotations entered in association with encounter. Interpretation of such information and data should be left to medically-trained personnel. Information provided to patient can be located elsewhere in the medical record under "Patient Instructions". Document created using STT-dictation technology, any transcriptional errors that may result from process are unintentional.    Contact & Pharmacy Preferred: [phone] Home: [phone] (home) Mobile: [phone] (mobile) E-mail: [email or web address]  [facility] [address]), [place] [address] Phone: [phone] Fax: [phone]  [id number] [place] [address] [place]: [phone]: [phone]  Pre-screening  [name] offered "in-person" vs "virtual" encounter. She indicated preferring virtual for this encounter.   Reason COVID-19*  Social distancing based on CDC and AMA recommendations.   I contacted Rachel Jumboon 12/13/2019 via telephone.      I clearly identified myself as BGillis Santa MD. I verified that I was speaking with the correct person using two identifiers (Name: Rachel Holmes and date of birth: [id number].  This visit was completed via telephone due to the restrictions of the COVID-19 pandemic. All issues as above were discussed and addressed but no physical exam was performed. If it was felt that the patient should be  evaluated in the office, they were directed there. The patient verbally consented to this visit. Patient was unable to complete an audio/visual visit due to Technical difficulties and/or Lack of internet. Due to the catastrophic nature of the COVID-19 pandemic, this visit was done through audio contact only.  Location of the patient: home address (see Epic for details)  Location of the provider: office  Consent I sought verbal advanced consent from Rachel Jumbofor virtual visit interactions. I informed [name] of possible security and privacy concerns, risks, and limitations associated with providing "not-in-person" medical evaluation and management services. I also informed [name] of the availability of "in-person" appointments. Finally, I informed her that there would be a charge for the virtual visit and that she could be  personally, fully or partially, financially responsible for it. [name] understanding and agreed to proceed.   Historic Elements   Ms. Rachel BRADWAYis a 59y.o. year old, female patient evaluated today after her last contact with our practice on 11/18/2019. [name] has a past medical history of Allergic rhinitis, Allergy, Anemia, Anxiety, Arthritis, Back pain, Bradycardia, Breast mass, CHF (congestive heart failure) ([place], CKD (chronic kidney disease), Constipation, Diabetes mellitus without complication ([place], Diabetic nephropathy ([place], Dyspnea, GERD (gastroesophageal reflux disease), Heart murmur, HLD (hyperlipidemia), Hyperparathyroidism ([place], Hypertension, Joint pain, Leg edema, Legally blind in left eye, as defined in [place] Lymphedema, Obesity, and Onychomycosis. She also  has a past surgical history that includes Abdominal hysterectomy; TEE without cardioversion (N/A, 12/28/2018); IR Fluoro Guide CV Line Right (12/29/2018); IR [place] Vasc Access Right (12/29/2018); IR Removal Tun Cv Cath W/O FL (02/19/2019); Vaginal hysterectomy; Breast  biopsy (Left, 2014); Eye surgery (Left, 2007); and Amputation (Left, 05/05/2019).  Ms. Holmes has a current medication list which includes the following prescription(s): albuterol, amlodipine, aspirin, one touch ultra 2, clobetasol cream, clonidine, freestyle libre 14 day reader, freestyle libre 14 day sensor, trulicity, duloxetine, ensure max protein, fluticasone, gabapentin, glucosamine-chondroitin, glucose blood, novolog flexpen, insulin pen needle, onetouch ultrasoft, multivitamin with minerals, olmesartan, omeprazole, ondansetron, torsemide, tramadol, vitamin b-12, docusate sodium, ofloxacin, and prednisolone acetate. She  reports that she has never smoked. She has never used smokeless tobacco. She reports that she does not drink alcohol or use drugs. [name] is allergic to statins; penicillins; codeine; ibuprofen; percocet [oxycodone-acetaminophen]; tramadol; and vicodin [hydrocodone-acetaminophen].   HPI  Today, she is being contacted for a post-procedure assessment.  Evaluation of last interventional procedure  11/08/2019 Procedure: RIGHT knee GNB  Sedation: Please see nurses note for DOS. When no sedatives are used, the analgesic levels obtained are directly associated to the effectiveness of the local anesthetics. However, when sedation is provided, the level of analgesia obtained during the initial 1 hour following the intervention, is believed to be the result of a combination of factors. These factors may include, but are not limited to: 1. The effectiveness of the local anesthetics used. 2. The effects of the analgesic(s) and/or anxiolytic(s) used. 3. The degree of discomfort experienced by the patient at the time of the procedure. 4. The patients ability and reliability in recalling and recording the events. 5. The presence and influence of possible secondary gains and/or psychosocial factors. Reported result: Relief experienced during the 1st hour after the procedure: 100 %  (Ultra-Short Term Relief)            Interpretative annotation: Clinically appropriate result. Analgesia during this period is likely to be Local Anesthetic and/or IV Sedative (Analgesic/Anxiolytic) related.          Effects of local anesthetic: The analgesic effects attained during this period are directly associated to the localized infiltration of local anesthetics and therefore cary significant diagnostic value as to the etiological location, or anatomical origin, of the pain. Expected duration of relief is directly dependent on the pharmacodynamics of the local anesthetic used. Long-acting (4-6 hours) anesthetics used.  Reported result: Relief during the next 4 to 6 hour after the procedure: 100 %(remained numb for about 4 hours) (Short-Term Relief)            Interpretative annotation: Clinically appropriate result. Analgesia during this period is likely to be Local Anesthetic-related.          Long-term benefit: Defined as the period of time past the expected duration of local anesthetics (1 hour for short-acting and 4-6 hours for long-acting). With the possible exception of prolonged sympathetic blockade from the local anesthetics, benefits during this period are typically attributed to, or associated with, other factors such as analgesic sensory neuropraxia, antiinflammatory effects, or beneficial biochemical changes provided by agents other than the local anesthetics.  Reported result: Extended relief following procedure: 75 %(pain is impoved but she does continue to have some discomfort in the right knee) (Long-Term Relief)            Interpretative annotation: Clinically appropriate result. Good relief. No permanent benefit expected. Inflammation plays a part in the etiology to the pain.          PLAN FOR RFA- RIGHT KNEE  Laboratory Chemistry Profile   Renal Lab Results  Component Value Date   BUN 66 (H) 05/28/2019   CREATININE 3.25 (H) 05/28/2019   BCR 17 06/25/2018   GFRAA 17 (L)  05/28/2019  GFRNONAA 15 (L) 05/28/2019    Hepatic Lab Results  Component Value Date   AST 27 05/28/2019   ALT 23 05/28/2019   ALBUMIN 4.0 05/28/2019   ALKPHOS 144 (H) 05/28/2019   LIPASE 103 11/19/2013    Electrolytes Lab Results  Component Value Date   NA 136 05/28/2019   K 4.3 05/28/2019   CL 103 05/28/2019   CALCIUM 8.8 (L) 05/28/2019   MG 2.4 05/12/2019   PHOS 4.5 09/09/2014    Bone Lab Results  Component Value Date   VD25OH 28.5 (L) 06/25/2018    Coagulation Lab Results  Component Value Date   INR 0.8 05/29/2019   LABPROT 11.3 (L) 05/29/2019   APTT 31 04/30/2019   PLT 243 05/28/2019    Cardiovascular Lab Results  Component Value Date   BNP 601.9 (H) 12/28/2018   CKTOTAL 180 10/21/2013   CKMB 1.5 10/21/2013   TROPONINI < 0.02 11/19/2013   HGB 9.4 (L) 05/28/2019   HCT 29.2 (L) 05/28/2019    Inflammation (CRP: Acute Phase) (ESR: Chronic Phase) Lab Results  Component Value Date   CRP 29.7 (H) 12/26/2018   ESRSEDRATE 102 (H) 12/26/2018   LATICACIDVEN 2.1 ([place]) 12/23/2018      Note: Above Lab results reviewed.  Imaging  Fluoro (C-Arm) (<60 min) (No Report) Fluoro was used, but no Radiologist interpretation will be provided.  Please refer to "NOTES" tab for provider progress note.  Assessment  The primary encounter diagnosis was Primary osteoarthritis of right knee. Diagnoses of Chronic pain of right knee and Chronic pain syndrome were also pertinent to this visit.  Plan of Care  Problem-specific:  Primary osteoarthritis of right knee Patient is status post right knee diagnostic genicular nerve block which provided her with greater than 75% pain relief for approximately 2 weeks.  She states that she is able to ambulate and bear weight with less pain.  She continues to work with physical therapy.  She endorses muscle cramps in her posterior knee region.  We discussed the next steps after diagnostic genicular nerve block which includes right genicular nerve  radiofrequency ablation.  Risks and benefits of this procedure were discussed and patient like to proceed.  Orders:  Orders Placed This Encounter  Procedures  . Radiofrequency,Genicular    Standing Status:   Future    Standing Expiration Date:   06/11/2021    Scheduling Instructions:     Side(s): RIGHT     Level(s): Superior-Lateral, Superior-Medial, and Inferior-Medial Genicular Nerve(s)     Sedation: With Sedation     Scheduling Timeframe: As soon as pre-approved    Order Specific Question:   Where will this procedure be performed?    Answer:   [facility]   Follow-up plan:   Return in about 1 week (around 12/20/2019) for Right genicular RFA with sedation.     s/p R knee hyalgan 08/30/2019 and 09/13/2019, R knee steroid injection on 09/29/2019- not helpful,  R knee GNB- 11/08/19- 75% pain relief for 3-4 weeks, plan for R knee RFA next     Recent Visits Date Type Provider Dept  11/08/19 Procedure visit Gillis Santa, MD [facility]  10/18/19 Office Visit Gillis Santa, MD [facility]  09/29/19 Procedure visit Gillis Santa, MD [facility]  Showing recent visits within past 90 days and meeting all other requirements   Today's Visits Date Type Provider Dept  12/13/19 Office Visit Gillis Santa, MD [facility]  Showing today's visits and meeting all  other requirements   Future Appointments No visits were found meeting these conditions.  Showing future appointments within next 90 days and meeting all other requirements   I discussed the assessment and treatment plan with the patient. The patient was provided an opportunity to ask questions and all were answered. The patient agreed with the plan and demonstrated an understanding of the instructions.  Patient advised to call back or seek an in-person evaluation if the symptoms or condition worsens.  Duration of encounter:58mnutes.  Note by: BGillis Santa MD Date: 12/13/2019; Time: 2:02  PM

## 2019-12-15 ENCOUNTER — Other Ambulatory Visit: Payer: Self-pay

## 2019-12-15 ENCOUNTER — Ambulatory Visit: Payer: Medicare Other

## 2019-12-15 DIAGNOSIS — R2689 Other abnormalities of gait and mobility: Secondary | ICD-10-CM | POA: Diagnosis not present

## 2019-12-15 DIAGNOSIS — M6281 Muscle weakness (generalized): Secondary | ICD-10-CM

## 2019-12-15 DIAGNOSIS — R2681 Unsteadiness on feet: Secondary | ICD-10-CM

## 2019-12-15 NOTE — Therapy (Signed)
[place] MAIN [facility] SERVICES [address] [place], [place], [address] Phone: [phone]   Fax:  [phone]  Physical Therapy Treatment  Patient Details  Name: Rachel Holmes MRN: [id number] Date of Birth: 1961-03-08 Referring Provider (PT): Eulogio Ditch    Encounter Date: 12/15/2019  PT End of Session - 12/15/19 1157    Visit Number  24    Number of Visits  36    Date for PT Re-Evaluation  01/24/20    Authorization Type  4/10 PN on 11/29/19    PT Start Time  1100    PT Stop Time  1146    PT Time Calculation (min)  46 min    Equipment Utilized During Treatment  Gait belt    Activity Tolerance  Patient tolerated treatment well;Patient limited by fatigue;Patient limited by pain    Behavior During Therapy  [facility] for tasks assessed/performed       Past Medical History:  Diagnosis Date  . Allergic rhinitis   . Allergy   . Anemia   . Anxiety   . Arthritis   . Back pain   . Bradycardia   . Breast mass    Patient can no longer palpate specific masses but showed tech general area of concern  . CHF (congestive heart failure) ([place])   . CKD (chronic kidney disease)    STAGE 3  . Constipation   . Diabetes mellitus without complication ([place])   . Diabetic nephropathy ([place])   . Dyspnea   . GERD (gastroesophageal reflux disease)   . Heart murmur   . HLD (hyperlipidemia)   . Hyperparathyroidism ([place])   . Hypertension   . Joint pain   . Leg edema   . Legally blind in left eye, as defined in [place]   . Lymphedema   . Obesity   . Onychomycosis     Past Surgical History:  Procedure Laterality Date  . ABDOMINAL HYSTERECTOMY    . AMPUTATION Left 05/05/2019   Procedure: AMPUTATION BELOW KNEE;  Surgeon: Katha Cabal, MD;  Location: [facility] ORS;  Service: Vascular;  Laterality: Left;  . BREAST BIOPSY Left 2014   FNA 12:00 position - Negative  . EYE SURGERY Left 2007   removed a lens, no lens implanted  . IR FLUORO GUIDE CV LINE RIGHT   12/29/2018  . IR REMOVAL TUN CV CATH W/O FL  02/19/2019  . IR US GUIDE VASC ACCESS RIGHT  12/29/2018  . TEE WITHOUT CARDIOVERSION N/A 12/28/2018   Procedure: TRANSESOPHAGEAL ECHOCARDIOGRAM (TEE);  Surgeon: Sueanne Margarita, MD;  Location: [facility] ENDOSCOPY;  Service: Cardiovascular;  Laterality: N/A;  . VAGINAL HYSTERECTOMY     abdominal hyst, not a vaginal hyst    There were no vitals filed for this visit.  Subjective Assessment - 12/15/19 1106    Subjective  Patient reports she is having difficulty walking at night. No falls or LOB since last session. Talked with physician and will be getting a nerve block    Patient is accompained by:  --   ex husband   Pertinent History  Patient is a pleasant 59 year old female who presents for gait training/ stability for L BKA. Recently obtained her prosthesis last Friday 08/06/19 and has not worn it since the prosthetists office. PMH includes anemia, anxiety, arthritis, back pain, bradycardia, CHF, CKD stage II, DM, diabetic nephropathy, dyspnea, GERD, HLD, hyperparathyroidism, hypotension, legally blind in left eye, lymphedema, obesity, onychomycosis. Left BKA performed 05/05/19, noted in history  that patient had a fall after procedure injuring the anterior portion of the suture line. Currently using a manual chair to get around the home. At this time she can't get into bathroom due to wheelchair, is waiting for a handicap apartment.    Limitations  Lifting;Standing;Walking;House hold activities    How long can you sit comfortably?  n/a    How long can you stand comfortably?  only stood once with prosthetist    How long can you walk comfortably?  not able to walk yet.    Patient Stated Goals  to walk again with prosthesis    Currently in Pain?  No/denies         Vitals prior to session start; 148/65 pulse 73    CGA in // bars:  -Front walk with BUE support, 4 lengths of // bars  -Side step, 4 lengths of // bars, cued for upright posture and pushing with legs  instead of arms  -Ambulating with cane to translate to use of AD in daily life,x 12 lengths of // bars, cueing for cane and foot placement; seated rest breaks after every 2-4 lengths of // bars. C/o low back pain    Standing with RW:  -Ambulate 55 ft with bariatric RW and CGA with cues for upright posture and decreased UE support x2 trials; very fatigued by end of session  -slow marches with focus on upright posture and decreasing UE support ;  -upright standing posture with no UE support 42 seconds with focus on gluteal squeeze  Seated:  Swiss ball rollout forward flexion for reduction of low back pain 15x  RTB adduction 15x RLE RTB hamstring curl 12x RLE    Patient requires cueing via tactile, verbal, and demonstration for stability and mobility with active interventions.                          PT Education - 12/15/19 1119    Education Details  exercise technique, mobility with quad cane    Person(s) Educated  Patient    Methods  Explanation;Demonstration;Tactile cues;Verbal cues    Comprehension  Verbalized understanding;Returned demonstration;Verbal cues required;Tactile cues required       PT Short Term Goals - 11/29/19 1135      PT SHORT TERM GOAL #1   Title  Patient will be independent in home exercise program to improve strength/mobility for better functional independence with ADLs.    Baseline  10/7: HEP given; 11/23: HEP compliant 1/25: HEP compliant, progressing with increasing challenge of HEP    Time  2    Period  Weeks    Status  Partially Met    Target Date  12/13/19        PT Long Term Goals - 11/29/19 1121      PT LONG TERM GOAL #1   Title  Patient will tolerate static standing for > 5 minutes for standing ADL performance with weight acceptance onto prosthetic limb to improve quality of life and mobility.    Baseline  10/7: 1 minute 39 seconds with heavy UE support on RW 11/23: 1 minute 50 seconds static stand terminated due to back  pain not knees. , 3 minutes of walking/dynamic standing. 11/29/19: 3 min 28 second due to back pain    Time  8    Period  Weeks    Status  Partially Met    Target Date  01/24/20      PT LONG TERM GOAL #  2   Title  Patient will ambulate >100 feet with RW, mod I with right prosthetic exhibiting reciprocal gait pattern with good  management of prosthetic and good safety awareness for improved home ambulation    Baseline  10/7: unable to ambulate 11/23: 86 ft with RW 11/29/19: able to ambulate 100 ft with RW    Time  8    Period  Weeks    Status  Achieved      PT LONG TERM GOAL #3   Title  Patient (< 18 years old) will complete five times sit to stand test in < 10 seconds indicating an increased LE strength and improved balance.    Baseline  10/7: 21 seconds with heavy BUE support from raised surface 11/23: 16 seconds with BUE support and RW 1/25; 14 seconds with one hand on chair one hand on RW    Time  8    Period  Weeks    Status  Partially Met    Target Date  01/24/20      PT LONG TERM GOAL #4   Title  Patient will increase BLE gross strength to 4+/5 as to improve functional strength for independent gait, increased standing tolerance and increased ADL ability.    Baseline  10/7: L 3+/5 R 4-/5 11/23: L 4-/5 R 4/5 with knee 4-/5 due to pain 1/25: L 4/5 gross with 3/5 IR R 4+/5 gross with 4/5 ext/flex 3+/IR    Time  8    Period  Weeks    Status  Partially Met    Target Date  01/24/20      PT LONG TERM GOAL #5   Title  Patient will ambulate 50 ft with a quad cane for with CGA or less assistance for ambulating around house and safe performance of ADLs/iADLs.    Baseline  1/25: requires use of quad cane in // bars; unable to ambulate outside of // bars    Time  8    Period  Weeks    Status  New    Target Date  01/24/20            Plan - 12/15/19 1728    Clinical Impression Statement  Patient presents with good motivation to physical therapy session. Focus on stability with  decreased UE support as well as upright posture for improved COM focused on during session with patient demonstrating understanding with repetition. Back pain relieved with upright posture corrections. Patient will benefit from skilled physical therapy for ambulation and transfers with prosthesis, strength and stability, and increase stability with functional mobility for quality of life and decreased falls risk    Personal Factors and Comorbidities  Age;Comorbidity 3+;Education;Finances;Fitness;Past/Current Experience;Social Background;Time since onset of injury/illness/exacerbation;Other    Comorbidities  anemia, anxiety, arthritis, back pain, bradycardia, CHF, CKD stage III, DM, diabetic nephropathy, dyspnea, GERD, heart murmer, HLD, hyperparathyroidism, HTN, blind in L eye, obesity, oncychomycosis    Examination-Activity Limitations  Bathing;Bed Mobility;Caring for Others;Bend;Carry;Dressing;Continence;Hygiene/Grooming;Stairs;Squat;Reach Overhead;Locomotion Level;Lift;Stand;Toileting;Transfers    Examination-Participation Restrictions  Church;Cleaning;Community Activity;Interpersonal Relationship;Driving;Laundry;Volunteer;Shop;Personal Finances;Meal Prep;Yard Work;Other    Stability/Clinical Decision Making  Evolving/Moderate complexity    Rehab Potential  Fair    PT Frequency  2x / week    PT Duration  8 weeks    PT Treatment/Interventions  ADLs/Self Care Home Management;Aquatic Therapy;Biofeedback;Cryotherapy;Electrical Stimulation;Iontophoresis 77m/ml Dexamethasone;Moist Heat;Traction;Ultrasound;Therapeutic activities;Functional mobility training;Stair training;Gait training;DME Instruction;Therapeutic exercise;Balance training;Neuromuscular re-education;Patient/family education;Manual techniques;Wheelchair mTax adviserCompression bandaging;Scar mobilization;Passive range of motion;Dry needling;Energy conservation;Splinting;Taping    PT Next Visit Plan  walk  with QC    PT  Home Exercise Plan  prone 10 minutes a day, wear prosthesis 1 hour a day, side lie posterior kick backs 10x    Consulted and Agree with Plan of Care  Patient       Patient will benefit from skilled therapeutic intervention in order to improve the following deficits and impairments:  Abnormal gait, Cardiopulmonary status limiting activity, Decreased activity tolerance, Decreased balance, Decreased knowledge of precautions, Decreased endurance, Decreased coordination, Decreased knowledge of use of DME, Decreased mobility, Decreased range of motion, Decreased safety awareness, Difficulty walking, Decreased strength, Hypomobility, Impaired flexibility, Impaired perceived functional ability, Impaired sensation, Obesity, Prosthetic Dependency, Postural dysfunction, Improper body mechanics, Pain  Visit Diagnosis: Other abnormalities of gait and mobility  Muscle weakness (generalized)  Unsteadiness on feet     Problem List Patient Active Problem List   Diagnosis Date Noted  . Acidosis 09/20/2019  . Benign hypertensive kidney disease with chronic kidney disease 09/20/2019  . Proteinuria 09/20/2019  . Secondary hyperparathyroidism of renal origin ([place]) 09/20/2019  . Cataract 09/02/2019  . Chronic pain syndrome 07/13/2019  . Wound dehiscence 05/29/2019  . Hx of BKA, left ([place]) 05/24/2019  . PVD (peripheral vascular disease) ([place]) 05/13/2019  . Chronic osteomyelitis of ankle and foot, left ([place]) 05/05/2019  . Chronic diastolic CHF (congestive heart failure) ([place]) 04/19/2019  . Leg edema 04/19/2019  . Acute bacterial endocarditis 04/19/2019  . Chest pain of uncertain etiology 16/08/9603  . Ulcer of left ankle ([place]) 04/14/2019  . Ankle deformity, left 01/21/2019  . Diabetic polyneuropathy associated with type 2 diabetes mellitus ([place]) 01/21/2019  . Aortic valve endocarditis   . Bacteremia due to Streptococcus 12/24/2018  . Diabetic ulcer of left ankle associated with diabetes mellitus due to  underlying condition, with fat layer exposed ([place]) 11/09/2018  . Malignant hypertension (arteriolar nephrosclerosis), stage 1-4 or unspecified chronic kidney disease 07/15/2018  . Stage 4 chronic kidney disease ([place]) 07/15/2018  . Morbid obesity with BMI of 45.0-49.9, adult ([place]) 07/15/2018  . Excessive daytime sleepiness 07/15/2018  . Other fatigue 03/11/2018  . Essential hypertension 03/11/2018  . Vitamin D deficiency 03/11/2018  . Congestive heart failure ([place]) 03/11/2018  . Primary osteoarthritis of right knee 01/06/2018  . Uncontrolled type 2 diabetes mellitus with polyneuropathy ([place]) 10/03/2017  . Chronic pain of right knee 10/02/2017  . Obesity 10/02/2017  . Charcot's joint of left foot 08/29/2017  . Lymphedema 09/06/2016  . Hyponatremia with extracellular fluid depletion 02/02/2016  . Nausea with vomiting, unspecified 02/02/2016  . Closed nondisplaced fracture of right patella 08/23/2015  . Acute cystitis without hematuria 08/14/2014  . Colitis 08/14/2014  . HTN (hypertension), malignant 08/14/2014  . Acute renal failure superimposed on stage 3 chronic kidney disease ([place]) 06/26/2014  . Hyperparathyroidism, unspecified ([place]) 02/24/2014  . Allergic rhinitis 02/22/2014  . Onychomycosis 02/22/2014  . Anxiety 11/05/2013  . Bradycardia 05/14/2013  . Gastro-esophageal reflux disease without esophagitis 04/05/2013  . Anemia in other chronic diseases classified elsewhere 01/27/2013   Janna Arch, PT, DPT   12/15/2019, 5:30 PM  [place] MAIN [facility] SERVICES [address] [place], [place], [address] Phone: [phone]   Fax:  [phone]  Name: Rachel Holmes MRN: [id number] Date of Birth: Nov 08, 1960

## 2019-12-20 ENCOUNTER — Other Ambulatory Visit: Payer: Self-pay | Admitting: Endocrinology

## 2019-12-20 ENCOUNTER — Ambulatory Visit: Payer: Medicare Other

## 2019-12-20 DIAGNOSIS — E1142 Type 2 diabetes mellitus with diabetic polyneuropathy: Secondary | ICD-10-CM

## 2019-12-20 DIAGNOSIS — E1165 Type 2 diabetes mellitus with hyperglycemia: Secondary | ICD-10-CM

## 2019-12-20 DIAGNOSIS — IMO0002 Reserved for concepts with insufficient information to code with codable children: Secondary | ICD-10-CM

## 2019-12-22 ENCOUNTER — Ambulatory Visit: Payer: Medicare Other

## 2019-12-22 ENCOUNTER — Other Ambulatory Visit: Payer: Self-pay

## 2019-12-24 ENCOUNTER — Ambulatory Visit: Payer: Medicare HMO | Admitting: Endocrinology

## 2019-12-24 ENCOUNTER — Ambulatory Visit (INDEPENDENT_AMBULATORY_CARE_PROVIDER_SITE_OTHER): Payer: Medicare Other | Admitting: Endocrinology

## 2019-12-24 DIAGNOSIS — E1142 Type 2 diabetes mellitus with diabetic polyneuropathy: Secondary | ICD-10-CM | POA: Diagnosis not present

## 2019-12-24 DIAGNOSIS — E1165 Type 2 diabetes mellitus with hyperglycemia: Secondary | ICD-10-CM | POA: Diagnosis not present

## 2019-12-24 DIAGNOSIS — IMO0002 Reserved for concepts with insufficient information to code with codable children: Secondary | ICD-10-CM

## 2019-12-24 MED ORDER — FREESTYLE LIBRE 14 DAY READER DEVI: 1.0000 | Freq: Once | 0 refills | Status: DC

## 2019-12-24 MED ORDER — INSULIN LISPRO (1 UNIT DIAL) 100 UNIT/ML (KWIKPEN): PEN_INJECTOR | SUBCUTANEOUS | 3 refills | Status: DC

## 2019-12-24 MED ORDER — FREESTYLE LIBRE 14 DAY SENSOR MISC: 1.0000 | 3 refills | Status: DC

## 2019-12-24 NOTE — Patient Instructions (Signed)
please change the insulin to 3 times a day (just before each meal) 10-20-18 units Please come back for a follow-up appointment in 1 month.

## 2019-12-24 NOTE — Progress Notes (Signed)
Subjective:    Patient ID: Rachel Holmes, female    DOB: Nov 29, 1960, 59 y.o.   MRN: [id number]  HPI telehealth visit today via doxy video visit.  Alternatives to telehealth are presented to this patient, and the patient agrees to the telehealth visit. Pt is advised of the cost of the visit, and agrees to this, also.   Patient is at home, and I am at the office.   Persons attending the telehealth visit: the patient and I Pt returns for f/u of diabetes mellitus: DM type: Insulin-requiring type 2 Dx'ed: 1988, during a pregnancy, but it persisted after Complications: polyneuropathy, renal failure, leg ulcer, left BKA, PAD, and PDR.   Therapy: insulin since 6314, and trulicity DKA: never Severe hypoglycemia: 2 episodes (both many years ago).   Pancreatitis: never Pancreatic imaging: normal on 2008 [name].   Other: a trial off insulin in early 2020 was unsuccessful; renal failure limits rx options; she does not need basal insulin.  Interval history: no recent steroids.  pt states cbg's vary from 69-350.  It is in general highest at HS, and lowest in the afternoon.  She says she never misses the insulin. Past Medical History:  Diagnosis Date  . Allergic rhinitis   . Allergy   . Anemia   . Anxiety   . Arthritis   . Back pain   . Bradycardia   . Breast mass    Patient can no longer palpate specific masses but showed tech general area of concern  . CHF (congestive heart failure) ([place])   . CKD (chronic kidney disease)    STAGE 3  . Constipation   . Diabetes mellitus without complication ([place])   . Diabetic nephropathy ([place])   . Dyspnea   . GERD (gastroesophageal reflux disease)   . Heart murmur   . HLD (hyperlipidemia)   . Hyperparathyroidism ([place])   . Hypertension   . Joint pain   . Leg edema   . Legally blind in left eye, as defined in [place]   . Lymphedema   . Obesity   . Onychomycosis     Past Surgical History:  Procedure Laterality Date  . ABDOMINAL HYSTERECTOMY    .  AMPUTATION Left 05/05/2019   Procedure: AMPUTATION BELOW KNEE;  Surgeon: Katha Cabal, MD;  Location: [facility] ORS;  Service: Vascular;  Laterality: Left;  . BREAST BIOPSY Left 2014   FNA 12:00 position - Negative  . EYE SURGERY Left 2007   removed a lens, no lens implanted  . IR FLUORO GUIDE CV LINE RIGHT  12/29/2018  . IR REMOVAL TUN CV CATH W/O FL  02/19/2019  . IR US GUIDE VASC ACCESS RIGHT  12/29/2018  . TEE WITHOUT CARDIOVERSION N/A 12/28/2018   Procedure: TRANSESOPHAGEAL ECHOCARDIOGRAM (TEE);  Surgeon: Sueanne Margarita, MD;  Location: [facility] ENDOSCOPY;  Service: Cardiovascular;  Laterality: N/A;  . VAGINAL HYSTERECTOMY     abdominal hyst, not a vaginal hyst    Social History   Socioeconomic History  . Marital status: Divorced    Spouse name: Not on file  . Number of children: 1  . Years of education: Not on file  . Highest education level: Not on file  Occupational History  . Occupation: Glass blower/designer    Comment: disability  Tobacco Use  . Smoking status: Never Smoker  . Smokeless tobacco: Never Used  Substance and Sexual Activity  . Alcohol use: No  . Drug use: Never  . Sexual activity: Not on file  Other Topics Concern  . Not on file  Social History Narrative   ** Merged History Encounter **    on disability   Social Determinants of Health   Financial Resource Strain:   . Difficulty of Paying Living Expenses: Not on file  Food Insecurity:   . Worried About Charity fundraiser in the Last Year: Not on file  . Ran Out of Food in the Last Year: Not on file  Transportation Needs:   . Lack of Transportation (Medical): Not on file  . Lack of Transportation (Non-Medical): Not on file  Physical Activity:   . Days of Exercise per Week: Not on file  . Minutes of Exercise per Session: Not on file  Stress:   . Feeling of Stress : Not on file  Social Connections:   . Frequency of Communication with Friends and Family: Not on file  . Frequency of Social Gatherings with  Friends and Family: Not on file  . Attends Religious Services: Not on file  . Active Member of Clubs or Organizations: Not on file  . Attends Archivist Meetings: Not on file  . Marital Status: Not on file  Intimate Partner Violence:   . Fear of Current or Ex-Partner: Not on file  . Emotionally Abused: Not on file  . Physically Abused: Not on file  . Sexually Abused: Not on file    Current Outpatient Medications on File Prior to Visit  Medication Sig Dispense Refill  . albuterol (PROVENTIL HFA;VENTOLIN HFA) 108 (90 Base) MCG/ACT inhaler Inhale 1-2 puffs into the lungs every 6 (six) hours as needed for wheezing or shortness of breath.    [name] amLODipine (NORVASC) 10 MG tablet Take 10 mg by mouth daily.    [name] aspirin EC 81 MG EC tablet Take 1 tablet (81 mg total) by mouth daily.    . Blood Glucose Monitoring Suppl (ONE TOUCH ULTRA 2) w/Device KIT 1 Device by Does not apply route daily. 1 each 0  . clobetasol cream (TEMOVATE) 9.35 % Apply 1 application topically 2 (two) times daily.    . cloNIDine (CATAPRES) 0.3 MG tablet Take 0.3 mg by mouth 2 (two) times daily.    [name] docusate sodium (COLACE) 100 MG capsule Take 1 capsule (100 mg total) by mouth 2 (two) times daily. (Patient not taking: Reported on 12/09/2019) 10 capsule 0  . Dulaglutide (TRULICITY) 3 TS/1.7BL SOPN Inject 3 mg into the skin once a week. 12 pen 3  . DULoxetine (CYMBALTA) 30 MG capsule Take 30 mg by mouth daily.     . Ensure Max Protein (ENSURE MAX PROTEIN) LIQD Take 330 mLs (11 oz total) by mouth 2 (two) times daily.    . fluticasone (FLONASE) 50 MCG/ACT nasal spray Place 1 spray into both nostrils daily.     [name] gabapentin (NEURONTIN) 600 MG tablet Take 600 mg by mouth at bedtime.    [name] glucosamine-chondroitin 500-400 MG tablet Take 1 tablet by mouth 2 (two) times daily.    . Insulin Pen Needle (BD PEN NEEDLE MICRO U/F) 32G X 6 MM MISC 1 each by Does not apply route 3 (three) times daily. 100 each 3  . Lancets (ONETOUCH  ULTRASOFT) lancets Used to check blood sugars four times daily. 200 each 12  . Multiple Vitamin (MULTIVITAMIN WITH MINERALS) TABS tablet Take 1 tablet by mouth daily.    [name] ofloxacin (OCUFLOX) 0.3 % ophthalmic solution Place 1 drop into both eyes in the morning, at noon, in the  evening, and at bedtime.    [name] olmesartan (BENICAR) 20 MG tablet Take 40 mg by mouth daily. Pt is taking 36m daily    . omeprazole (PRILOSEC) 40 MG capsule Take 40 mg by mouth 2 (two) times daily.    . ondansetron (ZOFRAN) 4 MG tablet Take 4 mg by mouth every 8 (eight) hours as needed for nausea or vomiting.    .Glory RosebushVERIO test strip USE TO CHECK BLOOD SUGAR TWICE DAILY 100 each 0  . prednisoLONE acetate (PRED FORTE) 1 % ophthalmic suspension Place 1 drop into both eyes in the morning, at noon, in the evening, and at bedtime.    . torsemide (DEMADEX) 20 MG tablet Take 1 tablet (20 mg total) by mouth 2 (two) times daily. Take an extra 236mtablet daily prn in the morning for swelling or sob.    . traMADol (ULTRAM) 50 MG tablet One to Two Tabs Every Six Hours As Needed For Pain 40 tablet 0  . vitamin B-12 (CYANOCOBALAMIN) 1000 MCG tablet Take 1,000 mcg by mouth daily.     No current facility-administered medications on file prior to visit.    Allergies  Allergen Reactions  . Statins Shortness Of Breath and Other (See Comments)    Wheezing, short of breath  . Penicillins Hives and Other (See Comments)    Has patient had a PCN reaction causing immediate rash, facial/tongue/throat swelling, SOB or lightheadedness with hypotension: No Has patient had a PCN reaction causing severe rash involving mucus membranes or skin necrosis: No Has patient had a PCN reaction that required hospitalization No Has patient had a PCN reaction occurring within the last 10 years: No If all of the above answers are "NO", then may proceed with Cephalosporin use.  . Codeine Nausea Only  . Ibuprofen Other (See Comments)    Reaction:  Raises  pts BP  . Percocet [Oxycodone-Acetaminophen] Nausea Only  . Tramadol Nausea Only and Other (See Comments)    Can take if she has eaten. Currently takes with good results   . Vicodin [Hydrocodone-Acetaminophen] Itching and Nausea Only    Family History  Problem Relation Age of Onset  . Breast cancer Sister 5811. Diabetes Sister   . Diabetes Mother   . Hypertension Mother   . Hyperlipidemia Mother   . Eating disorder Mother   . Obesity Mother     There were no vitals taken for this visit.   Review of Systems She denies LOC    Objective:   Physical Exam    Lab Results  Component Value Date   CREATININE 3.25 (H) 05/28/2019   BUN 66 (H) 05/28/2019   NA 136 05/28/2019   K 4.3 05/28/2019   CL 103 05/28/2019   CO2 20 (L) 05/28/2019      Assessment & Plan:  Insulin-requiring type 2 DM, with PAD: Based on the pattern of her cbg's, she needs some adjustment in her therapy.  Renal failure: in this context, she does not need basal insulin.  Hypoglycemia: this limits aggressiveness of glycemic control   Patient Instructions  please change the insulin to 3 times a day (just before each meal) 10-20-18 units Please come back for a follow-up appointment in 1 month.

## 2019-12-27 ENCOUNTER — Other Ambulatory Visit: Payer: Self-pay

## 2019-12-27 ENCOUNTER — Ambulatory Visit: Payer: Medicare Other

## 2019-12-27 DIAGNOSIS — R2689 Other abnormalities of gait and mobility: Secondary | ICD-10-CM | POA: Diagnosis not present

## 2019-12-27 DIAGNOSIS — M6281 Muscle weakness (generalized): Secondary | ICD-10-CM

## 2019-12-27 DIAGNOSIS — R2681 Unsteadiness on feet: Secondary | ICD-10-CM

## 2019-12-27 NOTE — Therapy (Signed)
[place] MAIN [facility] SERVICES [address] [place], [place], [address] Phone: [phone]   Fax:  [phone]  Physical Therapy Treatment  Patient Details  Name: Rachel Holmes MRN: [id number] Date of Birth: 06-22-1961 Referring Provider (PT): Eulogio Ditch    Encounter Date: 12/27/2019  PT End of Session - 12/27/19 1055    Visit Number  25    Number of Visits  36    Date for PT Re-Evaluation  01/24/20    Authorization Type  5/10 PN on 11/29/19    PT Start Time  1100    PT Stop Time  1145    PT Time Calculation (min)  45 min    Equipment Utilized During Treatment  Gait belt    Activity Tolerance  Patient tolerated treatment well;Patient limited by fatigue;Patient limited by pain    Behavior During Therapy  [facility] for tasks assessed/performed       Past Medical History:  Diagnosis Date  . Allergic rhinitis   . Allergy   . Anemia   . Anxiety   . Arthritis   . Back pain   . Bradycardia   . Breast mass    Patient can no longer palpate specific masses but showed tech general area of concern  . CHF (congestive heart failure) ([place])   . CKD (chronic kidney disease)    STAGE 3  . Constipation   . Diabetes mellitus without complication ([place])   . Diabetic nephropathy ([place])   . Dyspnea   . GERD (gastroesophageal reflux disease)   . Heart murmur   . HLD (hyperlipidemia)   . Hyperparathyroidism ([place])   . Hypertension   . Joint pain   . Leg edema   . Legally blind in left eye, as defined in [place]   . Lymphedema   . Obesity   . Onychomycosis     Past Surgical History:  Procedure Laterality Date  . ABDOMINAL HYSTERECTOMY    . AMPUTATION Left 05/05/2019   Procedure: AMPUTATION BELOW KNEE;  Surgeon: Katha Cabal, MD;  Location: [facility] ORS;  Service: Vascular;  Laterality: Left;  . BREAST BIOPSY Left 2014   FNA 12:00 position - Negative  . EYE SURGERY Left 2007   removed a lens, no lens implanted  . IR FLUORO GUIDE CV LINE RIGHT   12/29/2018  . IR REMOVAL TUN CV CATH W/O FL  02/19/2019  . IR US GUIDE VASC ACCESS RIGHT  12/29/2018  . TEE WITHOUT CARDIOVERSION N/A 12/28/2018   Procedure: TRANSESOPHAGEAL ECHOCARDIOGRAM (TEE);  Surgeon: Sueanne Margarita, MD;  Location: [facility] ENDOSCOPY;  Service: Cardiovascular;  Laterality: N/A;  . VAGINAL HYSTERECTOMY     abdominal hyst, not a vaginal hyst    There were no vitals filed for this visit.  Subjective Assessment - 12/27/19 1059    Subjective  Patient reports no pain or falls/LOB since last session. She felt fatigued last week.    Patient is accompained by:  --   ex husband   Pertinent History  Patient is a pleasant 59 year old female who presents for gait training/ stability for L BKA. Recently obtained her prosthesis last Friday 08/06/19 and has not worn it since the prosthetists office. PMH includes anemia, anxiety, arthritis, back pain, bradycardia, CHF, CKD stage II, DM, diabetic nephropathy, dyspnea, GERD, HLD, hyperparathyroidism, hypotension, legally blind in left eye, lymphedema, obesity, onychomycosis. Left BKA performed 05/05/19, noted in history that patient had a fall after procedure injuring the anterior portion of  the suture line. Currently using a manual chair to get around the home. At this time she can't get into bathroom due to wheelchair, is waiting for a handicap apartment.    Limitations  Lifting;Standing;Walking;House hold activities    How long can you sit comfortably?  n/a    How long can you stand comfortably?  only stood once with prosthetist    How long can you walk comfortably?  not able to walk yet.    Patient Stated Goals  to walk again with prosthesis    Currently in Pain?  No/denies    Pain Score  0-No pain      Prior to session BP: 134/58  Seated rest breaks between exercises/sets  6" step seated hamstring stretch, RLE: 2x 30 sec, LLE 30 sec for reduced LE cramping  Seated Therapeutic Exercise:  Single leg press into BOSU (blue side facing up),  10x each LE, cueing for abdominal activation for upright posture and for breathing technique  BTB single leg adduction while keeping opposite LE stable, 10x each LE  LAQ with contralateral UE forward reach, 5#, 10x total, for coordination to replicate gait mechanics and LE strength  Rainbow ball adduction with hip IR/ER, 10x each LE  // bars, CGA:  Ambulate with BUE support, 7 lengths of // bars with seated breaks for hamstring stretch to reduce RLE cramping, cue for upright posture   Standing alternating marches, BUE support, 10x total, followed by seated rest break due to R leg pain from weight acceptance  Tic tac toe, 2 rounds, SUE support, for dynamic balance; reaching inside/outside BOS    Patient reported with good motivation. She performed well with strength and coordination exercises this session with Min A cueing. Cueing for upright posture during gait was required to relieve neck pain resulting from increased UE reliance. Patient RLE fatigued with weight acceptance exercises and she required a seated rest break. Patient would benefit from skilled PT intervention to increase strength, stability, and gait mechanics in order to improve quality of life and decrease fall risk.                     PT Education - 12/27/19 1052    Education Details  exercise technique, body mechanics    Person(s) Educated  Patient    Methods  Explanation;Demonstration;Tactile cues;Verbal cues    Comprehension  Verbalized understanding;Returned demonstration;Verbal cues required;Tactile cues required       PT Short Term Goals - 11/29/19 1135      PT SHORT TERM GOAL #1   Title  Patient will be independent in home exercise program to improve strength/mobility for better functional independence with ADLs.    Baseline  10/7: HEP given; 11/23: HEP compliant 1/25: HEP compliant, progressing with increasing challenge of HEP    Time  2    Period  Weeks    Status  Partially Met    Target  Date  12/13/19        PT Long Term Goals - 11/29/19 1121      PT LONG TERM GOAL #1   Title  Patient will tolerate static standing for > 5 minutes for standing ADL performance with weight acceptance onto prosthetic limb to improve quality of life and mobility.    Baseline  10/7: 1 minute 39 seconds with heavy UE support on RW 11/23: 1 minute 50 seconds static stand terminated due to back pain not knees. , 3 minutes of walking/dynamic standing. 11/29/19: 3 min 28  second due to back pain    Time  8    Period  Weeks    Status  Partially Met    Target Date  01/24/20      PT LONG TERM GOAL #2   Title  Patient will ambulate >100 feet with RW, mod I with right prosthetic exhibiting reciprocal gait pattern with good  management of prosthetic and good safety awareness for improved home ambulation    Baseline  10/7: unable to ambulate 11/23: 86 ft with RW 11/29/19: able to ambulate 100 ft with RW    Time  8    Period  Weeks    Status  Achieved      PT LONG TERM GOAL #3   Title  Patient (< 53 years old) will complete five times sit to stand test in < 10 seconds indicating an increased LE strength and improved balance.    Baseline  10/7: 21 seconds with heavy BUE support from raised surface 11/23: 16 seconds with BUE support and RW 1/25; 14 seconds with one hand on chair one hand on RW    Time  8    Period  Weeks    Status  Partially Met    Target Date  01/24/20      PT LONG TERM GOAL #4   Title  Patient will increase BLE gross strength to 4+/5 as to improve functional strength for independent gait, increased standing tolerance and increased ADL ability.    Baseline  10/7: L 3+/5 R 4-/5 11/23: L 4-/5 R 4/5 with knee 4-/5 due to pain 1/25: L 4/5 gross with 3/5 IR R 4+/5 gross with 4/5 ext/flex 3+/IR    Time  8    Period  Weeks    Status  Partially Met    Target Date  01/24/20      PT LONG TERM GOAL #5   Title  Patient will ambulate 50 ft with a quad cane for with CGA or less assistance for  ambulating around house and safe performance of ADLs/iADLs.    Baseline  1/25: requires use of quad cane in // bars; unable to ambulate outside of // bars    Time  8    Period  Weeks    Status  New    Target Date  01/24/20            Plan - 12/27/19 1202    Clinical Impression Statement  Patient reported with good motivation. She performed well with strength and coordination exercises this session with Min A cueing. Cueing for upright posture during gait was required to relieve neck pain resulting from increased UE reliance. Patient RLE fatigued with weight acceptance exercises and she required a seated rest break. Patient would benefit from skilled PT intervention to increase strength, stability, and gait mechanics in order to improve quality of life and decrease fall risk.    Personal Factors and Comorbidities  Age;Comorbidity 3+;Education;Finances;Fitness;Past/Current Experience;Social Background;Time since onset of injury/illness/exacerbation;Other    Comorbidities  anemia, anxiety, arthritis, back pain, bradycardia, CHF, CKD stage III, DM, diabetic nephropathy, dyspnea, GERD, heart murmer, HLD, hyperparathyroidism, HTN, blind in L eye, obesity, oncychomycosis    Examination-Activity Limitations  Bathing;Bed Mobility;Caring for Others;Bend;Carry;Dressing;Continence;Hygiene/Grooming;Stairs;Squat;Reach Overhead;Locomotion Level;Lift;Stand;Toileting;Transfers    Examination-Participation Restrictions  Church;Cleaning;Community Activity;Interpersonal Relationship;Driving;Laundry;Volunteer;Shop;Personal Finances;Meal Prep;Yard Work;Other    Stability/Clinical Decision Making  Evolving/Moderate complexity    Rehab Potential  Fair    PT Frequency  2x / week    PT Duration  8 weeks  PT Treatment/Interventions  ADLs/Self Care Home Management;Aquatic Therapy;Biofeedback;Cryotherapy;Electrical Stimulation;Iontophoresis 70m/ml Dexamethasone;Moist Heat;Traction;Ultrasound;Therapeutic  activities;Functional mobility training;Stair training;Gait training;DME Instruction;Therapeutic exercise;Balance training;Neuromuscular re-education;Patient/family education;Manual techniques;Wheelchair mTax adviserCompression bandaging;Scar mobilization;Passive range of motion;Dry needling;Energy conservation;Splinting;Taping    PT Next Visit Plan  walk with QC    PT Home Exercise Plan  prone 10 minutes a day, wear prosthesis 1 hour a day, side lie posterior kick backs 10x    Consulted and Agree with Plan of Care  Patient       Patient will benefit from skilled therapeutic intervention in order to improve the following deficits and impairments:  Abnormal gait, Cardiopulmonary status limiting activity, Decreased activity tolerance, Decreased balance, Decreased knowledge of precautions, Decreased endurance, Decreased coordination, Decreased knowledge of use of DME, Decreased mobility, Decreased range of motion, Decreased safety awareness, Difficulty walking, Decreased strength, Hypomobility, Impaired flexibility, Impaired perceived functional ability, Impaired sensation, Obesity, Prosthetic Dependency, Postural dysfunction, Improper body mechanics, Pain  Visit Diagnosis: Other abnormalities of gait and mobility  Muscle weakness (generalized)  Unsteadiness on feet     Problem List Patient Active Problem List   Diagnosis Date Noted  . Acidosis 09/20/2019  . Benign hypertensive kidney disease with chronic kidney disease 09/20/2019  . Proteinuria 09/20/2019  . Secondary hyperparathyroidism of renal origin ([place] 09/20/2019  . Cataract 09/02/2019  . Chronic pain syndrome 07/13/2019  . Wound dehiscence 05/29/2019  . Hx of BKA, left ([place] 05/24/2019  . PVD (peripheral vascular disease) ([place] 05/13/2019  . Chronic osteomyelitis of ankle and foot, left ([place] 05/05/2019  . Chronic diastolic CHF (congestive heart failure) ([place] 04/19/2019  . Leg edema 04/19/2019  .  Acute bacterial endocarditis 04/19/2019  . Chest pain of uncertain etiology 076/72/0947 . Ulcer of left ankle ([place] 04/14/2019  . Ankle deformity, left 01/21/2019  . Diabetic polyneuropathy associated with type 2 diabetes mellitus ([place] 01/21/2019  . Aortic valve endocarditis   . Bacteremia due to Streptococcus 12/24/2018  . Diabetic ulcer of left ankle associated with diabetes mellitus due to underlying condition, with fat layer exposed ([place] 11/09/2018  . Malignant hypertension (arteriolar nephrosclerosis), stage 1-4 or unspecified chronic kidney disease 07/15/2018  . Stage 4 chronic kidney disease ([facility]afael Gonzalez 07/15/2018  . Morbid obesity with BMI of 45.0-49.9, adult ([place] 07/15/2018  . Excessive daytime sleepiness 07/15/2018  . Other fatigue 03/11/2018  . Essential hypertension 03/11/2018  . Vitamin D deficiency 03/11/2018  . Congestive heart failure ([place] 03/11/2018  . Primary osteoarthritis of right knee 01/06/2018  . Uncontrolled type 2 diabetes mellitus with polyneuropathy ([place] 10/03/2017  . Chronic pain of right knee 10/02/2017  . Obesity 10/02/2017  . Charcot's joint of left foot 08/29/2017  . Lymphedema 09/06/2016  . Hyponatremia with extracellular fluid depletion 02/02/2016  . Nausea with vomiting, unspecified 02/02/2016  . Closed nondisplaced fracture of right patella 08/23/2015  . Acute cystitis without hematuria 08/14/2014  . Colitis 08/14/2014  . HTN (hypertension), malignant 08/14/2014  . Acute renal failure superimposed on stage 3 chronic kidney disease (HS[place] 06/26/2014  . Hyperparathyroidism, unspecified (HC[place] 02/24/2014  . Allergic rhinitis 02/22/2014  . Onychomycosis 02/22/2014  . Anxiety 11/05/2013  . Bradycardia 05/14/2013  . Gastro-esophageal reflux disease without esophagitis 04/05/2013  . Anemia in other chronic diseases classified elsewhere 01/27/2013    JFlorinda Marker SPT  This entire session was performed under direct supervision and direction of a  licensed therapist/therapist assistant . I have personally read, edited and approve of the note as written.  MJanna Arch PT, DPT   12/27/2019, 1:32  PM  [place] MAIN [facility] SERVICES [address] [place], [place], [address] Phone: [phone]   Fax:  [phone]  Name: Rachel Holmes MRN: [id number] Date of Birth: [birth year]

## 2019-12-28 ENCOUNTER — Ambulatory Visit: Payer: Medicare HMO | Admitting: Cardiovascular Disease

## 2019-12-29 ENCOUNTER — Other Ambulatory Visit: Payer: Self-pay

## 2019-12-29 ENCOUNTER — Ambulatory Visit: Payer: Medicare Other

## 2019-12-29 DIAGNOSIS — R2689 Other abnormalities of gait and mobility: Secondary | ICD-10-CM | POA: Diagnosis not present

## 2019-12-29 DIAGNOSIS — R2681 Unsteadiness on feet: Secondary | ICD-10-CM

## 2019-12-29 DIAGNOSIS — M6281 Muscle weakness (generalized): Secondary | ICD-10-CM

## 2019-12-29 NOTE — Therapy (Signed)
[place] MAIN [facility] SERVICES [address] [place], [place], [address] Phone: [phone]   Fax:  [phone]  Physical Therapy Treatment  Patient Details  Name: Rachel Holmes MRN: [id number] Date of Birth: [birth year] Referring Provider (PT): Eulogio Ditch    Encounter Date: 12/29/2019  PT End of Session - 12/29/19 1208    Visit Number  26    Number of Visits  36    Date for PT Re-Evaluation  01/24/20    Authorization Type  6/10 PN on 11/29/19    PT Start Time  1101    PT Stop Time  1145    PT Time Calculation (min)  44 min    Equipment Utilized During Treatment  Gait belt    Activity Tolerance  Patient tolerated treatment well;Patient limited by fatigue;Patient limited by pain    Behavior During Therapy  [facility] for tasks assessed/performed       Past Medical History:  Diagnosis Date  . Allergic rhinitis   . Allergy   . Anemia   . Anxiety   . Arthritis   . Back pain   . Bradycardia   . Breast mass    Patient can no longer palpate specific masses but showed tech general area of concern  . CHF (congestive heart failure) ([place])   . CKD (chronic kidney disease)    STAGE 3  . Constipation   . Diabetes mellitus without complication ([place])   . Diabetic nephropathy ([place])   . Dyspnea   . GERD (gastroesophageal reflux disease)   . Heart murmur   . HLD (hyperlipidemia)   . Hyperparathyroidism ([place])   . Hypertension   . Joint pain   . Leg edema   . Legally blind in left eye, as defined in [place]   . Lymphedema   . Obesity   . Onychomycosis     Past Surgical History:  Procedure Laterality Date  . ABDOMINAL HYSTERECTOMY    . AMPUTATION Left 05/05/2019   Procedure: AMPUTATION BELOW KNEE;  Surgeon: Katha Cabal, MD;  Location: [facility] ORS;  Service: Vascular;  Laterality: Left;  . BREAST BIOPSY Left 2014   FNA 12:00 position - Negative  . EYE SURGERY Left 2007   removed a lens, no lens implanted  . IR FLUORO GUIDE CV LINE RIGHT   12/29/2018  . IR REMOVAL TUN CV CATH W/O FL  02/19/2019  . IR US GUIDE VASC ACCESS RIGHT  12/29/2018  . TEE WITHOUT CARDIOVERSION N/A 12/28/2018   Procedure: TRANSESOPHAGEAL ECHOCARDIOGRAM (TEE);  Surgeon: Sueanne Margarita, MD;  Location: [facility] ENDOSCOPY;  Service: Cardiovascular;  Laterality: N/A;  . VAGINAL HYSTERECTOMY     abdominal hyst, not a vaginal hyst    There were no vitals filed for this visit.  Subjective Assessment - 12/29/19 1149    Subjective  Patient reports no pain or falls/LOB since last session. Was able to walk across parking lot to mailbox with AD.    Patient is accompained by:  --   ex husband   Pertinent History  Patient is a pleasant 59 year old female who presents for gait training/ stability for L BKA. Recently obtained her prosthesis last Friday 08/06/19 and has not worn it since the prosthetists office. PMH includes anemia, anxiety, arthritis, back pain, bradycardia, CHF, CKD stage II, DM, diabetic nephropathy, dyspnea, GERD, HLD, hyperparathyroidism, hypotension, legally blind in left eye, lymphedema, obesity, onychomycosis. Left BKA performed 05/05/19, noted in history that patient had a fall after  procedure injuring the anterior portion of the suture line. Currently using a manual chair to get around the home. At this time she can't get into bathroom due to wheelchair, is waiting for a handicap apartment.    Limitations  Lifting;Standing;Walking;House hold activities    How long can you sit comfortably?  n/a    How long can you stand comfortably?  only stood once with prosthetist    How long can you walk comfortably?  not able to walk yet.    Patient Stated Goals  to walk again with prosthesis    Currently in Pain?  No/denies    Pain Score  0-No pain     Vitals prior to session: BP 124/54, pulse 75 Seated or supine rest break required between exercises due to fatigue  Patient requires additional sock at beginning of PT session for proper fit of prosthetic limb.    Therapeutic Exercise: // bars, CGA: Agility ladder walking, 2 lengths // bars, cueing for hip and knee flexion to reduce circumduction, cueing for upright posture  Agility ladder side steps, 2 lengths // bars, cueing for upright posture  Ambulating with quad cane, 4 lengths // bars, cueing for proper gait pattern with AD and maintaining cane closer to body to improve stability  Supine: Bridges, 5x, PT stabilizing L foot, LLE cramping upon first repetition relieved with passive hamstring stretch  Supine passive L hamstring stretch, 2x 20 sec, to relieve cramping after bridges  Hooklying SB TrA press in, 10x 3 sec holds, cueing for proper direction of UE force to engage abdominal musculature  SB LE roll out, 10x, PT stabilizing L foot on SB  Straight leg abduction, opposite LE bent, 10x each LE  Seated: BTB paloff press with arms shoulder-width apart, 10x from each side, cueing for line of force/press directly forward from chest   Knee extension with contralateral UE forward reach, UE held at 90 degrees shoulder flexion  LAQ, 10x 3 sec hold each LE, cueing for upright posture  RTB hamstring curl with 3 sec eccentric, 10x each LE   Patient reported with excellent motivation. She performed well clearing feet during gait and correcting posture with VCs. She continues to rely heavily on UE support during ambulation exercises and is challenged with coordination, requiring Mod cueing for complex tasks. She was able to ambulate with cane with good technique after two trial lengths of // bars, the first two lengths continuously placing cane far in front and decreasing stability. Seated or supine rest breaks required between exercises due to fatigue. Patient would benefit from skilled PT intervention to improve strength, AD use, and stability in order to decrease fall risk and increase QOL.                        PT Education - 12/29/19 1056    Education Details   exercise technique, body mechanics    Person(s) Educated  Patient    Methods  Explanation;Demonstration;Tactile cues;Verbal cues;Handout    Comprehension  Verbalized understanding;Returned demonstration;Verbal cues required;Tactile cues required       PT Short Term Goals - 11/29/19 1135      PT SHORT TERM GOAL #1   Title  Patient will be independent in home exercise program to improve strength/mobility for better functional independence with ADLs.    Baseline  10/7: HEP given; 11/23: HEP compliant 1/25: HEP compliant, progressing with increasing challenge of HEP    Time  2    Period  Weeks    Status  Partially Met    Target Date  12/13/19        PT Long Term Goals - 11/29/19 1121      PT LONG TERM GOAL #1   Title  Patient will tolerate static standing for > 5 minutes for standing ADL performance with weight acceptance onto prosthetic limb to improve quality of life and mobility.    Baseline  10/7: 1 minute 39 seconds with heavy UE support on RW 11/23: 1 minute 50 seconds static stand terminated due to back pain not knees. , 3 minutes of walking/dynamic standing. 11/29/19: 3 min 28 second due to back pain    Time  8    Period  Weeks    Status  Partially Met    Target Date  01/24/20      PT LONG TERM GOAL #2   Title  Patient will ambulate >100 feet with RW, mod I with right prosthetic exhibiting reciprocal gait pattern with good  management of prosthetic and good safety awareness for improved home ambulation    Baseline  10/7: unable to ambulate 11/23: 86 ft with RW 11/29/19: able to ambulate 100 ft with RW    Time  8    Period  Weeks    Status  Achieved      PT LONG TERM GOAL #3   Title  Patient (< 3 years old) will complete five times sit to stand test in < 10 seconds indicating an increased LE strength and improved balance.    Baseline  10/7: 21 seconds with heavy BUE support from raised surface 11/23: 16 seconds with BUE support and RW 1/25; 14 seconds with one hand on chair  one hand on RW    Time  8    Period  Weeks    Status  Partially Met    Target Date  01/24/20      PT LONG TERM GOAL #4   Title  Patient will increase BLE gross strength to 4+/5 as to improve functional strength for independent gait, increased standing tolerance and increased ADL ability.    Baseline  10/7: L 3+/5 R 4-/5 11/23: L 4-/5 R 4/5 with knee 4-/5 due to pain 1/25: L 4/5 gross with 3/5 IR R 4+/5 gross with 4/5 ext/flex 3+/IR    Time  8    Period  Weeks    Status  Partially Met    Target Date  01/24/20      PT LONG TERM GOAL #5   Title  Patient will ambulate 50 ft with a quad cane for with CGA or less assistance for ambulating around house and safe performance of ADLs/iADLs.    Baseline  1/25: requires use of quad cane in // bars; unable to ambulate outside of // bars    Time  8    Period  Weeks    Status  New    Target Date  01/24/20            Plan - 12/29/19 1310    Clinical Impression Statement  Patient reported with excellent motivation. She performed well clearing feet during gait and correcting posture with VCs. She continues to rely heavily on UE support during ambulation exercises and is challenged with coordination, requiring Mod cueing for complex tasks. She was able to ambulate with cane with good technique after two trial lengths of // bars, the first two lengths continuously placing cane far in front and decreasing stability. Seated or  supine rest breaks required between exercises due to fatigue. Patient would benefit from skilled PT intervention to improve strength, AD use, and stability in order to decrease fall risk and increase QOL.    Personal Factors and Comorbidities  Age;Comorbidity 3+;Education;Finances;Fitness;Past/Current Experience;Social Background;Time since onset of injury/illness/exacerbation;Other    Comorbidities  anemia, anxiety, arthritis, back pain, bradycardia, CHF, CKD stage III, DM, diabetic nephropathy, dyspnea, GERD, heart murmer, HLD,  hyperparathyroidism, HTN, blind in L eye, obesity, oncychomycosis    Examination-Activity Limitations  Bathing;Bed Mobility;Caring for Others;Bend;Carry;Dressing;Continence;Hygiene/Grooming;Stairs;Squat;Reach Overhead;Locomotion Level;Lift;Stand;Toileting;Transfers    Examination-Participation Restrictions  Church;Cleaning;Community Activity;Interpersonal Relationship;Driving;Laundry;Volunteer;Shop;Personal Finances;Meal Prep;Yard Work;Other    Stability/Clinical Decision Making  Evolving/Moderate complexity    Rehab Potential  Fair    PT Frequency  2x / week    PT Duration  8 weeks    PT Treatment/Interventions  ADLs/Self Care Home Management;Aquatic Therapy;Biofeedback;Cryotherapy;Electrical Stimulation;Iontophoresis 58m/ml Dexamethasone;Moist Heat;Traction;Ultrasound;Therapeutic activities;Functional mobility training;Stair training;Gait training;DME Instruction;Therapeutic exercise;Balance training;Neuromuscular re-education;Patient/family education;Manual techniques;Wheelchair mTax adviserCompression bandaging;Scar mobilization;Passive range of motion;Dry needling;Energy conservation;Splinting;Taping    PT Next Visit Plan  walk with QC    PT Home Exercise Plan  prone 10 minutes a day, wear prosthesis 1 hour a day, side lie posterior kick backs 10x    Consulted and Agree with Plan of Care  Patient       Patient will benefit from skilled therapeutic intervention in order to improve the following deficits and impairments:  Abnormal gait, Cardiopulmonary status limiting activity, Decreased activity tolerance, Decreased balance, Decreased knowledge of precautions, Decreased endurance, Decreased coordination, Decreased knowledge of use of DME, Decreased mobility, Decreased range of motion, Decreased safety awareness, Difficulty walking, Decreased strength, Hypomobility, Impaired flexibility, Impaired perceived functional ability, Impaired sensation, Obesity, Prosthetic  Dependency, Postural dysfunction, Improper body mechanics, Pain  Visit Diagnosis: Other abnormalities of gait and mobility  Muscle weakness (generalized)  Unsteadiness on feet     Problem List Patient Active Problem List   Diagnosis Date Noted  . Acidosis 09/20/2019  . Benign hypertensive kidney disease with chronic kidney disease 09/20/2019  . Proteinuria 09/20/2019  . Secondary hyperparathyroidism of renal origin ([place] 09/20/2019  . Cataract 09/02/2019  . Chronic pain syndrome 07/13/2019  . Wound dehiscence 05/29/2019  . Hx of BKA, left ([place] 05/24/2019  . PVD (peripheral vascular disease) ([place] 05/13/2019  . Chronic osteomyelitis of ankle and foot, left ([facility] 05/05/2019  . Chronic diastolic CHF (congestive heart failure) ([place] 04/19/2019  . Leg edema 04/19/2019  . Acute bacterial endocarditis 04/19/2019  . Chest pain of uncertain etiology [id number] . Ulcer of left ankle ([place] 04/14/2019  . Ankle deformity, left 01/21/2019  . Diabetic polyneuropathy associated with type 2 diabetes mellitus ([place] 01/21/2019  . Aortic valve endocarditis   . Bacteremia due to Streptococcus 12/24/2018  . Diabetic ulcer of left ankle associated with diabetes mellitus due to underlying condition, with fat layer exposed ([facility] 11/09/2018  . Malignant hypertension (arteriolar nephrosclerosis), stage 1-4 or unspecified chronic kidney disease 07/15/2018  . Stage 4 chronic kidney disease ([facility] 07/15/2018  . Morbid obesity with BMI of 45.0-49.9, adult ([facility] 07/15/2018  . Excessive daytime sleepiness 07/15/2018  . Other fatigue 03/11/2018  . Essential hypertension 03/11/2018  . Vitamin D deficiency 03/11/2018  . Congestive heart failure ([facility] 03/11/2018  . Primary osteoarthritis of right knee 01/06/2018  . Uncontrolled type 2 diabetes mellitus with polyneuropathy ([facility] 10/03/2017  . Chronic pain of right knee 10/02/2017  . Obesity 10/02/2017  . Charcot's joint of left foot 08/29/2017  .  Lymphedema 09/06/2016  .  Hyponatremia with extracellular fluid depletion 02/02/2016  . Nausea with vomiting, unspecified 02/02/2016  . Closed nondisplaced fracture of right patella 08/23/2015  . Acute cystitis without hematuria 08/14/2014  . Colitis 08/14/2014  . HTN (hypertension), malignant 08/14/2014  . Acute renal failure superimposed on stage 3 chronic kidney disease ([place]) 06/26/2014  . Hyperparathyroidism, unspecified ([place]) 02/24/2014  . Allergic rhinitis 02/22/2014  . Onychomycosis 02/22/2014  . Anxiety 11/05/2013  . Bradycardia 05/14/2013  . Gastro-esophageal reflux disease without esophagitis 04/05/2013  . Anemia in other chronic diseases classified elsewhere 01/27/2013    Florinda Marker, SPT This entire session was performed under direct supervision and direction of a licensed therapist/therapist assistant . I have personally read, edited and approve of the note as written.  Janna Arch, PT, DPT   12/29/2019, 3:05 PM  [place] MAIN [facility] SERVICES [address] [place], [place], [address] Phone: [phone]   Fax:  [phone]  Name: Rachel Holmes MRN: [id number] Date of Birth: February 25, 1961

## 2020-01-03 ENCOUNTER — Ambulatory Visit: Payer: Medicare Other | Attending: Nurse Practitioner

## 2020-01-03 ENCOUNTER — Other Ambulatory Visit: Payer: Self-pay

## 2020-01-03 DIAGNOSIS — M6281 Muscle weakness (generalized): Secondary | ICD-10-CM

## 2020-01-03 DIAGNOSIS — R2689 Other abnormalities of gait and mobility: Secondary | ICD-10-CM | POA: Diagnosis present

## 2020-01-03 DIAGNOSIS — R2681 Unsteadiness on feet: Secondary | ICD-10-CM | POA: Diagnosis present

## 2020-01-03 NOTE — Therapy (Signed)
[place] MAIN [facility] SERVICES [address] [place], [place], [address] Phone: [phone]   Fax:  [phone]  Physical Therapy Treatment  Patient Details  Name: Rachel Holmes MRN: [id number] Date of Birth: [birth year] Referring Provider (PT): Eulogio Ditch    Encounter Date: 01/03/2020  PT End of Session - 01/03/20 1125    Visit Number  27    Number of Visits  36    Date for PT Re-Evaluation  01/24/20    Authorization Type  7/10 PN on 11/29/19    PT Start Time  1100    PT Stop Time  1144    PT Time Calculation (min)  44 min    Equipment Utilized During Treatment  Gait belt    Activity Tolerance  Patient tolerated treatment well;Patient limited by fatigue;Patient limited by pain    Behavior During Therapy  WFL for tasks assessed/performed       Past Medical History:  Diagnosis Date  . Allergic rhinitis   . Allergy   . Anemia   . Anxiety   . Arthritis   . Back pain   . Bradycardia   . Breast mass    Patient can no longer palpate specific masses but showed tech general area of concern  . CHF (congestive heart failure) ([place])   . CKD (chronic kidney disease)    STAGE 3  . Constipation   . Diabetes mellitus without complication ([place])   . Diabetic nephropathy ([place])   . Dyspnea   . GERD (gastroesophageal reflux disease)   . Heart murmur   . HLD (hyperlipidemia)   . Hyperparathyroidism ([place])   . Hypertension   . Joint pain   . Leg edema   . Legally blind in left eye, as defined in [place]   . Lymphedema   . Obesity   . Onychomycosis     Past Surgical History:  Procedure Laterality Date  . ABDOMINAL HYSTERECTOMY    . AMPUTATION Left 05/05/2019   Procedure: AMPUTATION BELOW KNEE;  Surgeon: Katha Cabal, MD;  Location: [facility] ORS;  Service: Vascular;  Laterality: Left;  . BREAST BIOPSY Left 2014   FNA 12:00 position - Negative  . EYE SURGERY Left 2007   removed a lens, no lens implanted  . IR FLUORO GUIDE CV LINE RIGHT   12/29/2018  . IR REMOVAL TUN CV CATH W/O FL  02/19/2019  . IR US GUIDE VASC ACCESS RIGHT  12/29/2018  . TEE WITHOUT CARDIOVERSION N/A 12/28/2018   Procedure: TRANSESOPHAGEAL ECHOCARDIOGRAM (TEE);  Surgeon: Sueanne Margarita, MD;  Location: [facility] ENDOSCOPY;  Service: Cardiovascular;  Laterality: N/A;  . VAGINAL HYSTERECTOMY     abdominal hyst, not a vaginal hyst    There were no vitals filed for this visit.  Subjective Assessment - 01/03/20 1104    Subjective  Patient reports she was able to drive for the first time over the weekend, went to [place] and visited a friend. Has been practicing her walking. No falls or LOB since last session.    Patient is accompained by:  --   ex husband   Pertinent History  Patient is a pleasant 59 year old female who presents for gait training/ stability for L BKA. Recently obtained her prosthesis last Friday 08/06/19 and has not worn it since the prosthetists office. PMH includes anemia, anxiety, arthritis, back pain, bradycardia, CHF, CKD stage II, DM, diabetic nephropathy, dyspnea, GERD, HLD, hyperparathyroidism, hypotension, legally blind in left eye, lymphedema, obesity, onychomycosis.  Left BKA performed 05/05/19, noted in history that patient had a fall after procedure injuring the anterior portion of the suture line. Currently using a manual chair to get around the home. At this time she can't get into bathroom due to wheelchair, is waiting for a handicap apartment.    Limitations  Lifting;Standing;Walking;House hold activities    How long can you sit comfortably?  n/a    How long can you stand comfortably?  only stood once with prosthetist    How long can you walk comfortably?  not able to walk yet.    Patient Stated Goals  to walk again with prosthesis    Currently in Pain?  Yes    Pain Score  4     Pain Location  Knee    Pain Orientation  Right    Pain Descriptors / Indicators  Aching    Pain Type  Chronic pain    Pain Onset  More than a month ago    Pain  Frequency  Constant       Vitals at start of session : 143/68 pulse 75    Standing ambulate 96 ft with RW and cueing for placement of self within walker, upright  Posture, weight shift and prosthetic clearance. Heavy reliance upon UE's. . One rest break during second lap due to need to add sock for proper fit. ; 3 laps total   seated exercises between laps: cueing for body mechanics/sequencing  BTB rows 15x  RTB adduction 15x each LE  marching with core contraction, tall posture and arms crossed, 12x each LE  RTB around ankle and prosthetic ankle: ER/IR 10x each LE  Straight leg abd/add 12x each LE Green swiss ball TrA pressing between knees and arms 10x 3 second holds  Modified windmills 10x each side,    Pt educated throughout session about proper posture and technique with exercises. Improved exercise technique, movement at target joints, use of target muscles after min to mod verbal, visual, tactile cues.                  PT Education - 01/03/20 1105    Education Details  exercise technique, body mechanics    Person(s) Educated  Patient    Methods  Explanation;Demonstration;Tactile cues;Verbal cues    Comprehension  Verbalized understanding;Returned demonstration;Verbal cues required;Tactile cues required       PT Short Term Goals - 11/29/19 1135      PT SHORT TERM GOAL #1   Title  Patient will be independent in home exercise program to improve strength/mobility for better functional independence with ADLs.    Baseline  10/7: HEP given; 11/23: HEP compliant 1/25: HEP compliant, progressing with increasing challenge of HEP    Time  2    Period  Weeks    Status  Partially Met    Target Date  12/13/19        PT Long Term Goals - 11/29/19 1121      PT LONG TERM GOAL #1   Title  Patient will tolerate static standing for > 5 minutes for standing ADL performance with weight acceptance onto prosthetic limb to improve quality of life and mobility.    Baseline   10/7: 1 minute 39 seconds with heavy UE support on RW 11/23: 1 minute 50 seconds static stand terminated due to back pain not knees. , 3 minutes of walking/dynamic standing. 11/29/19: 3 min 28 second due to back pain    Time  8  Period  Weeks    Status  Partially Met    Target Date  01/24/20      PT LONG TERM GOAL #2   Title  Patient will ambulate >100 feet with RW, mod I with right prosthetic exhibiting reciprocal gait pattern with good  management of prosthetic and good safety awareness for improved home ambulation    Baseline  10/7: unable to ambulate 11/23: 86 ft with RW 11/29/19: able to ambulate 100 ft with RW    Time  8    Period  Weeks    Status  Achieved      PT LONG TERM GOAL #3   Title  Patient (< 86 years old) will complete five times sit to stand test in < 10 seconds indicating an increased LE strength and improved balance.    Baseline  10/7: 21 seconds with heavy BUE support from raised surface 11/23: 16 seconds with BUE support and RW 1/25; 14 seconds with one hand on chair one hand on RW    Time  8    Period  Weeks    Status  Partially Met    Target Date  01/24/20      PT LONG TERM GOAL #4   Title  Patient will increase BLE gross strength to 4+/5 as to improve functional strength for independent gait, increased standing tolerance and increased ADL ability.    Baseline  10/7: L 3+/5 R 4-/5 11/23: L 4-/5 R 4/5 with knee 4-/5 due to pain 1/25: L 4/5 gross with 3/5 IR R 4+/5 gross with 4/5 ext/flex 3+/IR    Time  8    Period  Weeks    Status  Partially Met    Target Date  01/24/20      PT LONG TERM GOAL #5   Title  Patient will ambulate 50 ft with a quad cane for with CGA or less assistance for ambulating around house and safe performance of ADLs/iADLs.    Baseline  1/25: requires use of quad cane in // bars; unable to ambulate outside of // bars    Time  8    Period  Weeks    Status  New    Target Date  01/24/20            Plan - 01/03/20 1148    Clinical  Impression Statement  Patient presents with excellent motivation. She fatigue quickly with prolonged muscle recruitment/ prolonged mobility requiring focus on advancing capacity for mobility. She continues to rely heavily on UE support during ambulation exercises and is challenged with coordination. Patient would benefit from skilled PT intervention to increase strength, stability, and gait mechanics in order to improve quality of life and decrease fall risk.    Personal Factors and Comorbidities  Age;Comorbidity 3+;Education;Finances;Fitness;Past/Current Experience;Social Background;Time since onset of injury/illness/exacerbation;Other    Comorbidities  anemia, anxiety, arthritis, back pain, bradycardia, CHF, CKD stage III, DM, diabetic nephropathy, dyspnea, GERD, heart murmer, HLD, hyperparathyroidism, HTN, blind in L eye, obesity, oncychomycosis    Examination-Activity Limitations  Bathing;Bed Mobility;Caring for Others;Bend;Carry;Dressing;Continence;Hygiene/Grooming;Stairs;Squat;Reach Overhead;Locomotion Level;Lift;Stand;Toileting;Transfers    Examination-Participation Restrictions  Church;Cleaning;Community Activity;Interpersonal Relationship;Driving;Laundry;Volunteer;Shop;Personal Finances;Meal Prep;Yard Work;Other    Stability/Clinical Decision Making  Evolving/Moderate complexity    Rehab Potential  Fair    PT Frequency  2x / week    PT Duration  8 weeks    PT Treatment/Interventions  ADLs/Self Care Home Management;Aquatic Therapy;Biofeedback;Cryotherapy;Electrical Stimulation;Iontophoresis 64m/ml Dexamethasone;Moist Heat;Traction;Ultrasound;Therapeutic activities;Functional mobility training;Stair training;Gait training;DME Instruction;Therapeutic exercise;Balance training;Neuromuscular re-education;Patient/family education;Manual techniques;Wheelchair mobility training;Prosthetic  Training;Compression bandaging;Scar mobilization;Passive range of motion;Dry needling;Energy  conservation;Splinting;Taping    PT Next Visit Plan  walk with QC    PT Home Exercise Plan  prone 10 minutes a day, wear prosthesis 1 hour a day, side lie posterior kick backs 10x    Consulted and Agree with Plan of Care  Patient       Patient will benefit from skilled therapeutic intervention in order to improve the following deficits and impairments:  Abnormal gait, Cardiopulmonary status limiting activity, Decreased activity tolerance, Decreased balance, Decreased knowledge of precautions, Decreased endurance, Decreased coordination, Decreased knowledge of use of DME, Decreased mobility, Decreased range of motion, Decreased safety awareness, Difficulty walking, Decreased strength, Hypomobility, Impaired flexibility, Impaired perceived functional ability, Impaired sensation, Obesity, Prosthetic Dependency, Postural dysfunction, Improper body mechanics, Pain  Visit Diagnosis: Other abnormalities of gait and mobility  Muscle weakness (generalized)  Unsteadiness on feet     Problem List Patient Active Problem List   Diagnosis Date Noted  . Acidosis 09/20/2019  . Benign hypertensive kidney disease with chronic kidney disease 09/20/2019  . Proteinuria 09/20/2019  . Secondary hyperparathyroidism of renal origin ([place]) 09/20/2019  . Cataract 09/02/2019  . Chronic pain syndrome 07/13/2019  . Wound dehiscence 05/29/2019  . Hx of BKA, left ([place]) 05/24/2019  . PVD (peripheral vascular disease) ([place]) 05/13/2019  . Chronic osteomyelitis of ankle and foot, left ([place]) 05/05/2019  . Chronic diastolic CHF (congestive heart failure) ([place]) 04/19/2019  . Leg edema 04/19/2019  . Acute bacterial endocarditis 04/19/2019  . Chest pain of uncertain etiology 97/58/8325  . Ulcer of left ankle ([place]) 04/14/2019  . Ankle deformity, left 01/21/2019  . Diabetic polyneuropathy associated with type 2 diabetes mellitus ([place]) 01/21/2019  . Aortic valve endocarditis   . Bacteremia due to Streptococcus 12/24/2018   . Diabetic ulcer of left ankle associated with diabetes mellitus due to underlying condition, with fat layer exposed ([place]) 11/09/2018  . Malignant hypertension (arteriolar nephrosclerosis), stage 1-4 or unspecified chronic kidney disease 07/15/2018  . Stage 4 chronic kidney disease ([place]) 07/15/2018  . Morbid obesity with BMI of 45.0-49.9, adult ([place]) 07/15/2018  . Excessive daytime sleepiness 07/15/2018  . Other fatigue 03/11/2018  . Essential hypertension 03/11/2018  . Vitamin D deficiency 03/11/2018  . Congestive heart failure ([place]) 03/11/2018  . Primary osteoarthritis of right knee 01/06/2018  . Uncontrolled type 2 diabetes mellitus with polyneuropathy ([place]) 10/03/2017  . Chronic pain of right knee 10/02/2017  . Obesity 10/02/2017  . Charcot's joint of left foot 08/29/2017  . Lymphedema 09/06/2016  . Hyponatremia with extracellular fluid depletion 02/02/2016  . Nausea with vomiting, unspecified 02/02/2016  . Closed nondisplaced fracture of right patella 08/23/2015  . Acute cystitis without hematuria 08/14/2014  . Colitis 08/14/2014  . HTN (hypertension), malignant 08/14/2014  . Acute renal failure superimposed on stage 3 chronic kidney disease ([place]) 06/26/2014  . Hyperparathyroidism, unspecified ([place]) 02/24/2014  . Allergic rhinitis 02/22/2014  . Onychomycosis 02/22/2014  . Anxiety 11/05/2013  . Bradycardia 05/14/2013  . Gastro-esophageal reflux disease without esophagitis 04/05/2013  . Anemia in other chronic diseases classified elsewhere 01/27/2013   Janna Arch, PT, DPT   01/03/2020, 12:25 PM  [place] MAIN [facility] SERVICES [address] [place], [place], [address] Phone: [phone]   Fax:  [phone]  Name: Rachel Holmes MRN: [id number] Date of Birth: [birth year]

## 2020-01-05 ENCOUNTER — Ambulatory Visit: Payer: Medicare Other

## 2020-01-06 NOTE — Progress Notes (Signed)
Evaluation Performed:  Follow-up visit  Date:  01/07/2020   ID:  Rachel Holmes, DOB 10/22/1961, MRN [id number]  Patient Location:  [address] [place] [address]   Provider location:   [facility], [place] office  PCP:  Sharyne Peach, MD  Cardiologist:  Arvid Right [facility]  Chief Complaint  Patient presents with  . Other    6 month follow up. Meds reviewed verbally with patient.     History of Present Illness:    Rachel Holmes is a 59 y.o. female past medical history of Hypertension;  Anemia of chronic disease;  Obesity, unspecified;  CHF (congestive heart failure) (CMS-HCC);  GERD (gastroesophageal reflux disease);  Bradycardia;  Anxiety,  Hyperparathyroidism (CMS-HCC); Increased PTH level;  CKD (chronic kidney disease) stage 3, GFR 30-59 ml/min;   Type 2 diabetes mellitus with stage 3 chronic kidney disease (CMS-HCC);   Lymphedema   Charcot's joint of left foot  profound Charcot foot deformity in association with this large ulcer Skin ulcer of left ankle with necrosis of bone (HCC)  knee amputation on the left group G streptococcal bacteremia with aortic valve endocarditis and Who presents for follow-up of her chronic diastolic CHF  Chronic lower extremity edema L > R Denies shortness of breath or chest pain on torsemide 20 BID Followed by nephrology in [place]  Leg swelling on amlodipine, b/l Like to try to change to alternate medication  Dr. Abigail Butts for renal, started olmesartan PMD increased olmesartan  Lives with exhusband He smokes  Changes diet for diabetes Hemoglobin A1c 8.2 two months ago  EKG personally reviewed by myself on todays visit Shows normal sinus rhythm with rate 70 bpm APC, no significant ST-T wave changes  In hospital 12/2018 Sepsis secondary to group G streptococcal bacteremia with aortic valve endocarditis and left leg soft tissue infection: Hospital records reviewed with the patient in detail Both  TTE and TEE confirmed aortic valve endocarditis. Acute hypoxic respiratory failure likely secondary to aspiration pneumonia and decompensated diastolic heart failure: Occurred after extubation post TEE on 2/24, required BiPAP briefly. --done with ABX  CR 3.41 in 01/25/2019  Followed by nephrology Prior to recent hospitalization was only taking Lasix once a day, was discharged on torsemide 40 daily Reports that she does not drink very much, does not make much urine  [facility] records reviewed from 2017 as detailed below stress test done Prior cardiac catheterization.     Prior CV studies:   The following studies were reviewed today:  Echo 2017 NORMAL LEFT VENTRICULAR SYSTOLIC FUNCTION  WITH MILD LVH NORMAL RIGHT VENTRICULAR SYSTOLIC FUNCTION MILD VALVULAR REGURGITATION (See above) NO PERICARDIAL EFFUSION GRADE 2 DIASTOLIC DYSFUNCTION MILD AORTIC STENOSIS MILDLY DILATED LEFT ATRIUM MILD PULMONARY HTN WITH ESTIMATED RVSP = 45 MMHG   Echo 12/2018 . Small aortic valve mass, cannot exclude endocarditis vegetation. Measures approximately 0.75 x 1 cm. Demonstrates features of independent motion and is primarily seen on the ventricular aspect of the aortic valve. Best visualized in parasternal long  axis and short axis views. No significant aortic valve stenosis or regurgitation.  2. The left ventricle has normal systolic function, with an ejection fraction of 55-60%. The cavity size was normal. Left ventricular diastolic Doppler parameters are consistent with impaired relaxation.  3. The right ventricle has normal systolic function. The cavity was normal. There is no increase in right ventricular wall thickness. Right ventricular systolic pressure is mildly elevated with an estimated pressure of 35.5  mmHg.  4. The mitral valve is normal in structure.  5. The tricuspid valve is normal in structure.  6. The pulmonic valve was normal in structure.  7. The aortic valve is normal in  structure.  8. Cannot exclude small PFO with left to right shunt by color flow Doppler.  9. The inferior vena cava was dilated in size with <50% respiratory variability.   Past Medical History:  Diagnosis Date  . Allergic rhinitis   . Allergy   . Anemia   . Anxiety   . Arthritis   . Back pain   . Bradycardia   . Breast mass    Patient can no longer palpate specific masses but showed tech general area of concern  . CHF (congestive heart failure) ([place])   . CKD (chronic kidney disease)    STAGE 3  . Constipation   . Diabetes mellitus without complication ([place])   . Diabetic nephropathy ([place])   . Dyspnea   . GERD (gastroesophageal reflux disease)   . Heart murmur   . HLD (hyperlipidemia)   . Hyperparathyroidism ([place])   . Hypertension   . Joint pain   . Leg edema   . Legally blind in left eye, as defined in [place]   . Lymphedema   . Obesity   . Onychomycosis    Past Surgical History:  Procedure Laterality Date  . ABDOMINAL HYSTERECTOMY    . AMPUTATION Left 05/05/2019   Procedure: AMPUTATION BELOW KNEE;  Surgeon: Katha Cabal, MD;  Location: [facility] ORS;  Service: Vascular;  Laterality: Left;  . BREAST BIOPSY Left 2014   FNA 12:00 position - Negative  . EYE SURGERY Left 2007   removed a lens, no lens implanted  . IR FLUORO GUIDE CV LINE RIGHT  12/29/2018  . IR REMOVAL TUN CV CATH W/O FL  02/19/2019  . IR US GUIDE VASC ACCESS RIGHT  12/29/2018  . TEE WITHOUT CARDIOVERSION N/A 12/28/2018   Procedure: TRANSESOPHAGEAL ECHOCARDIOGRAM (TEE);  Surgeon: Sueanne Margarita, MD;  Location: [facility] ENDOSCOPY;  Service: Cardiovascular;  Laterality: N/A;  . VAGINAL HYSTERECTOMY     abdominal hyst, not a vaginal hyst     Current Meds  Medication Sig  . albuterol (PROVENTIL HFA;VENTOLIN HFA) 108 (90 Base) MCG/ACT inhaler Inhale 1-2 puffs into the lungs every 6 (six) hours as needed for wheezing or shortness of breath.  [name] amLODipine (NORVASC) 10 MG tablet Take 10 mg by mouth daily.  [name] aspirin EC  81 MG EC tablet Take 1 tablet (81 mg total) by mouth daily.  . Blood Glucose Monitoring Suppl (ONE TOUCH ULTRA 2) w/Device KIT 1 Device by Does not apply route daily.  . clobetasol cream (TEMOVATE) 8.29 % Apply 1 application topically 2 (two) times daily.  . cloNIDine (CATAPRES) 0.3 MG tablet Take 0.3 mg by mouth 2 (two) times daily.  . Continuous Blood Gluc Sensor (FREESTYLE LIBRE 14 DAY SENSOR) MISC 1 Device by Does not apply route every 14 (fourteen) days.  [name] docusate sodium (COLACE) 100 MG capsule Take 1 capsule (100 mg total) by mouth 2 (two) times daily.  . Dulaglutide (TRULICITY) 3 FA/2.1HY SOPN Inject 3 mg into the skin once a week.  . DULoxetine (CYMBALTA) 30 MG capsule Take 30 mg by mouth daily.   . Ensure Max Protein (ENSURE MAX PROTEIN) LIQD Take 330 mLs (11 oz total) by mouth 2 (two) times daily.  . fluticasone (FLONASE) 50 MCG/ACT nasal spray Place 1 spray into both nostrils daily.   [name]  gabapentin (NEURONTIN) 600 MG tablet Take 600 mg by mouth at bedtime.  [name] glucosamine-chondroitin 500-400 MG tablet Take 1 tablet by mouth 2 (two) times daily.  . insulin lispro (HUMALOG KWIKPEN) 100 UNIT/ML KwikPen 3 times a day (just before each meal) 10-20-18 units, and pen needles 3 day  . Insulin Pen Needle (BD PEN NEEDLE MICRO U/F) 32G X 6 MM MISC 1 each by Does not apply route 3 (three) times daily.  . Lancets (ONETOUCH ULTRASOFT) lancets Used to check blood sugars four times daily.  . Multiple Vitamin (MULTIVITAMIN WITH MINERALS) TABS tablet Take 1 tablet by mouth daily.  [name] ofloxacin (OCUFLOX) 0.3 % ophthalmic solution Place 1 drop into both eyes in the morning, at noon, in the evening, and at bedtime.  [name] olmesartan (BENICAR) 20 MG tablet Take 40 mg by mouth daily. Pt is taking 51m daily  . omeprazole (PRILOSEC) 40 MG capsule Take 40 mg by mouth 2 (two) times daily.  . ondansetron (ZOFRAN) 4 MG tablet Take 4 mg by mouth every 8 (eight) hours as needed for nausea or vomiting.  .Glory RosebushVERIO  test strip USE TO CHECK BLOOD SUGAR TWICE DAILY  . prednisoLONE acetate (PRED FORTE) 1 % ophthalmic suspension Place 1 drop into both eyes in the morning, at noon, in the evening, and at bedtime.  . torsemide (DEMADEX) 20 MG tablet Take 1 tablet (20 mg total) by mouth 2 (two) times daily. Take an extra 227mtablet daily prn in the morning for swelling or sob.  . traMADol (ULTRAM) 50 MG tablet One to Two Tabs Every Six Hours As Needed For Pain  . vitamin B-12 (CYANOCOBALAMIN) 1000 MCG tablet Take 1,000 mcg by mouth daily.     Allergies:   Statins, Penicillins, Codeine, Ibuprofen, Percocet [oxycodone-acetaminophen], Tramadol, and Vicodin [hydrocodone-acetaminophen]   Social History   Tobacco Use  . Smoking status: Never Smoker  . Smokeless tobacco: Never Used  Substance Use Topics  . Alcohol use: No  . Drug use: Never     Current Outpatient Medications on File Prior to Visit  Medication Sig Dispense Refill  . albuterol (PROVENTIL HFA;VENTOLIN HFA) 108 (90 Base) MCG/ACT inhaler Inhale 1-2 puffs into the lungs every 6 (six) hours as needed for wheezing or shortness of breath.    . Marland KitchenmLODipine (NORVASC) 10 MG tablet Take 10 mg by mouth daily.    . Marland Kitchenspirin EC 81 MG EC tablet Take 1 tablet (81 mg total) by mouth daily.    . Blood Glucose Monitoring Suppl (ONE TOUCH ULTRA 2) w/Device KIT 1 Device by Does not apply route daily. 1 each 0  . clobetasol cream (TEMOVATE) 0.4.00 Apply 1 application topically 2 (two) times daily.    . cloNIDine (CATAPRES) 0.3 MG tablet Take 0.3 mg by mouth 2 (two) times daily.    . Continuous Blood Gluc Sensor (FREESTYLE LIBRE 14 DAY SENSOR) MISC 1 Device by Does not apply route every 14 (fourteen) days. 6 each 3  . docusate sodium (COLACE) 100 MG capsule Take 1 capsule (100 mg total) by mouth 2 (two) times daily. 10 capsule 0  . Dulaglutide (TRULICITY) 3 MGQQ/7.6PPOPN Inject 3 mg into the skin once a week. 12 pen 3  . DULoxetine (CYMBALTA) 30 MG capsule Take 30 mg by  mouth daily.     . Ensure Max Protein (ENSURE MAX PROTEIN) LIQD Take 330 mLs (11 oz total) by mouth 2 (two) times daily.    . fluticasone (FLONASE) 50 MCG/ACT nasal spray  Place 1 spray into both nostrils daily.     [name] gabapentin (NEURONTIN) 600 MG tablet Take 600 mg by mouth at bedtime.    [name] glucosamine-chondroitin 500-400 MG tablet Take 1 tablet by mouth 2 (two) times daily.    . insulin lispro (HUMALOG KWIKPEN) 100 UNIT/ML KwikPen 3 times a day (just before each meal) 10-20-18 units, and pen needles 3 day 20 pen 3  . Insulin Pen Needle (BD PEN NEEDLE MICRO U/F) 32G X 6 MM MISC 1 each by Does not apply route 3 (three) times daily. 100 each 3  . Lancets (ONETOUCH ULTRASOFT) lancets Used to check blood sugars four times daily. 200 each 12  . Multiple Vitamin (MULTIVITAMIN WITH MINERALS) TABS tablet Take 1 tablet by mouth daily.    [name] ofloxacin (OCUFLOX) 0.3 % ophthalmic solution Place 1 drop into both eyes in the morning, at noon, in the evening, and at bedtime.    [name] olmesartan (BENICAR) 20 MG tablet Take 40 mg by mouth daily. Pt is taking 71m daily    . omeprazole (PRILOSEC) 40 MG capsule Take 40 mg by mouth 2 (two) times daily.    . ondansetron (ZOFRAN) 4 MG tablet Take 4 mg by mouth every 8 (eight) hours as needed for nausea or vomiting.    .Glory RosebushVERIO test strip USE TO CHECK BLOOD SUGAR TWICE DAILY 100 each 0  . prednisoLONE acetate (PRED FORTE) 1 % ophthalmic suspension Place 1 drop into both eyes in the morning, at noon, in the evening, and at bedtime.    . torsemide (DEMADEX) 20 MG tablet Take 1 tablet (20 mg total) by mouth 2 (two) times daily. Take an extra 227mtablet daily prn in the morning for swelling or sob.    . traMADol (ULTRAM) 50 MG tablet One to Two Tabs Every Six Hours As Needed For Pain 40 tablet 0  . vitamin B-12 (CYANOCOBALAMIN) 1000 MCG tablet Take 1,000 mcg by mouth daily.     No current facility-administered medications on file prior to visit.     Family Hx: The  patient's family history includes Breast cancer (age of onset: 5854in her sister; Diabetes in her mother and sister; Eating disorder in her mother; Hyperlipidemia in her mother; Hypertension in her mother; Obesity in her mother.  ROS:   Please see the history of present illness.    Review of Systems  Constitutional: Negative.   Respiratory: Negative.   Cardiovascular: Positive for leg swelling.  Gastrointestinal: Negative.   Musculoskeletal: Positive for joint pain.  Neurological: Negative.   Psychiatric/Behavioral: Negative.   All other systems reviewed and are negative.     Labs/Other Tests and Data Reviewed:    Recent Labs: 05/12/2019: Magnesium 2.4 05/28/2019: ALT 23; BUN 66; Creatinine, Ser 3.25; Hemoglobin 9.4; Platelets 243; Potassium 4.3; Sodium 136; TSH 1.701   Recent Lipid Panel Lab Results  Component Value Date/Time   CHOL 191 06/25/2018 10:50 AM   CHOL 157 11/20/2013 09:54 AM   TRIG 104 06/25/2018 10:50 AM   TRIG 114 11/20/2013 09:54 AM   HDL 47 06/25/2018 10:50 AM   HDL 38 (L) 11/20/2013 09:54 AM   LDLCALC 123 (H) 06/25/2018 10:50 AM   LDLCALC 96 11/20/2013 09:54 AM    Wt Readings from Last 3 Encounters:  01/07/20 (!) 312 lb (141.5 kg)  11/15/19 300 lb (136.1 kg)  11/08/19 290 lb (131.5 kg)     Exam:    Vital Signs: Vital signs may also be detailed in  the HPI BP (!) 154/70 (BP Location: Right Arm, Patient Position: Sitting, Cuff Size: Large)   Pulse 70   Ht 5' 8"  (1.727 m)   Wt (!) 312 lb (141.5 kg)   SpO2 98%   BMI 47.44 kg/m  Constitutional:  oriented to person, place, and time. No distress.  Obese HENT:  Head: Grossly normal Eyes:  no discharge. No scleral icterus.  Neck: No JVD, no carotid bruits  Cardiovascular: Regular rate and rhythm, no murmurs appreciated Pulmonary/Chest: Clear to auscultation bilaterally, no wheezes or rails Abdominal: Soft.  no distension.  no tenderness.  Musculoskeletal: Normal range of motion, amputation left lower  extremity, prosthesis in place Neurological:  normal muscle tone. Coordination normal. No atrophy Skin: Skin warm and dry Psychiatric: normal affect, pleasant  ASSESSMENT & PLAN:    Chronic diastolic CHF (congestive heart failure) (HCC)  euvolemic on torsemide 20 mg twice daily Some leg swelling, reports weight is stable At her request we will hold the amlodipine  Essential hypertension We will hold amlodipine, start hydralazine 50 three times daily She is concerned about leg swelling  Leg edema Chronic lymphedema, will hold amlodipine  Chest pain of uncertain etiology No recent chest pain symptoms, no further ischemic work-up No further ischemic work-up at this time  Endocarditis February 2020 Completed 6 weeks antibiotics Denies chills fever rigors No changes    Total encounter time more than 25 minutes  Greater than 50% was spent in counseling and coordination of care with the patient  Follow-up 6 months  Signed, Ida Rogue, MD  01/07/2020 10:43 AM    [place] [id number], [place], [place] [address]

## 2020-01-07 ENCOUNTER — Other Ambulatory Visit: Payer: Self-pay

## 2020-01-07 ENCOUNTER — Encounter: Payer: Self-pay | Admitting: Cardiovascular Disease

## 2020-01-07 ENCOUNTER — Ambulatory Visit (INDEPENDENT_AMBULATORY_CARE_PROVIDER_SITE_OTHER): Payer: Medicare Other | Admitting: Cardiovascular Disease

## 2020-01-07 VITALS — BP 154/70 | HR 70 | Ht 68.0 in | Wt 312.0 lb

## 2020-01-07 DIAGNOSIS — R079 Chest pain, unspecified: Secondary | ICD-10-CM | POA: Diagnosis not present

## 2020-01-07 DIAGNOSIS — N184 Chronic kidney disease, stage 4 (severe): Secondary | ICD-10-CM

## 2020-01-07 DIAGNOSIS — I5032 Chronic diastolic (congestive) heart failure: Secondary | ICD-10-CM | POA: Diagnosis not present

## 2020-01-07 DIAGNOSIS — I10 Essential (primary) hypertension: Secondary | ICD-10-CM

## 2020-01-07 DIAGNOSIS — I330 Acute and subacute infective endocarditis: Secondary | ICD-10-CM

## 2020-01-07 DIAGNOSIS — R600 Localized edema: Secondary | ICD-10-CM | POA: Diagnosis not present

## 2020-01-07 MED ORDER — HYDRALAZINE HCL 50 MG PO TABS: 50.0000 mg | ORAL_TABLET | Freq: Three times a day (TID) | ORAL | 3 refills | Status: DC

## 2020-01-07 MED ORDER — ISOSORBIDE MONONITRATE ER 120 MG PO TB24: 120.0000 mg | ORAL_TABLET | Freq: Every day | ORAL | 3 refills | Status: DC

## 2020-01-07 NOTE — Patient Instructions (Addendum)
Medication Instructions:  Your physician has recommended you make the following change in your medication:  1. HOLD Amlodipine 2. START Hydralazine 50 mg three times a day  Your physician has requested that you regularly monitor and record your blood pressure readings at home. Please use the same machine at the same time of day to check your readings and record them to bring to your follow-up visit.  Make sure to check blood pressures 2 hours after medications. Please read additional information below.     If you need a refill on your cardiac medications before your next appointment, please call your pharmacy.    Lab work: No new labs needed   If you have labs (blood work) drawn today and your tests are completely normal, you will receive your results only by: [name] MyChart Message (if you have MyChart) OR . A paper copy in the mail If you have any lab test that is abnormal or we need to change your treatment, we will call you to review the results.   Testing/Procedures: No new testing needed   Follow-Up: At [facility], you and your health needs are our priority.  As part of our continuing mission to provide you with exceptional heart care, we have created designated Provider Care Teams.  These Care Teams include your primary Cardiologist (physician) and Advanced Practice Providers (APPs -  Physician Assistants and Nurse Practitioners) who all work together to provide you with the care you need, when you need it.  . You will need a follow up appointment in 6 months   . Providers on your designated Care Team:   . Murray Hodgkins, NP . Christell Faith, PA-C . Marrianne Mood, PA-C  Any Other Special Instructions Will Be Listed Below (If Applicable).  For educational health videos Log in to : www.[email or web address] Or : [email or web address], password : [email or web address]   How to Take Your Blood Pressure You can take your blood pressure at home with a machine. You may need to check your blood pressure  at home:  To check if you have high blood pressure (hypertension).  To check your blood pressure over time.  To make sure your blood pressure medicine is working. Supplies needed: You will need a blood pressure machine, or monitor. You can buy one at a drugstore or online. When choosing one:  Choose one with an arm cuff.  Choose one that wraps around your upper arm. Only one finger should fit between your arm and the cuff.  Do not choose one that measures your blood pressure from your wrist or finger. Your doctor can suggest a monitor. How to prepare Avoid these things for 30 minutes before checking your blood pressure:  Drinking caffeine.  Drinking alcohol.  Eating.  Smoking.  Exercising. Five minutes before checking your blood pressure:  Pee.  Sit in a dining chair. Avoid sitting in a soft couch or armchair.  Be quiet. Do not talk. How to take your blood pressure Follow the instructions that came with your machine. If you have a digital blood pressure monitor, these may be the instructions: 1. Sit up straight. 2. Place your feet on the floor. Do not cross your ankles or legs. 3. Rest your left arm at the level of your heart. You may rest it on a table, desk, or chair. 4. Pull up your shirt sleeve. 5. Wrap the blood pressure cuff around the upper part of your left arm. The cuff should be 1 inch (2.5 cm) above your elbow. It  is best to wrap the cuff around bare skin. 6. Fit the cuff snugly around your arm. You should be able to place only one finger between the cuff and your arm. 7. Put the cord inside the groove of your elbow. 8. Press the power button. 9. Sit quietly while the cuff fills with air and loses air. 10. Write down the numbers on the screen. 11. Wait 2-3 minutes and then repeat steps 1-10. What do the numbers mean? Two numbers make up your blood pressure. The first number is called systolic pressure. The second is called diastolic pressure. An example of  a blood pressure reading is "120 over 80" (or 120/80). If you are an adult and do not have a medical condition, use this guide to find out if your blood pressure is normal: Normal  First number: below 120.  Second number: below 80. Elevated  First number: 120-129.  Second number: below 80. Hypertension stage 1  First number: 130-139.  Second number: 80-89. Hypertension stage 2  First number: 140 or above.  Second number: 49 or above. Your blood pressure is above normal even if only the top or bottom number is above normal. Follow these instructions at home:  Check your blood pressure as often as your doctor tells you to.  Take your monitor to your next doctor's appointment. Your doctor will: ? Make sure you are using it correctly. ? Make sure it is working right.  Make sure you understand what your blood pressure numbers should be.  Tell your doctor if your medicines are causing side effects. Contact a doctor if:  Your blood pressure keeps being high. Get help right away if:  Your first blood pressure number is higher than 180.  Your second blood pressure number is higher than 120. This information is not intended to replace advice given to you by your health care provider. Make sure you discuss any questions you have with your health care provider. Document Revised: 10/03/2017 Document Reviewed: 03/29/2016 Elsevier Patient Education  2020 [employer].

## 2020-01-10 ENCOUNTER — Ambulatory Visit: Payer: Medicare Other

## 2020-01-10 ENCOUNTER — Other Ambulatory Visit: Payer: Self-pay

## 2020-01-10 DIAGNOSIS — R2689 Other abnormalities of gait and mobility: Secondary | ICD-10-CM | POA: Diagnosis not present

## 2020-01-10 DIAGNOSIS — R2681 Unsteadiness on feet: Secondary | ICD-10-CM

## 2020-01-10 DIAGNOSIS — M6281 Muscle weakness (generalized): Secondary | ICD-10-CM

## 2020-01-10 NOTE — Therapy (Signed)
[place] MAIN [facility] SERVICES [address] [place], [place], [address] Phone: [phone]   Fax:  [phone]  Physical Therapy Treatment  Patient Details  Name: Rachel Holmes MRN: [id number] Date of Birth: 10-May-1961 Referring Provider (PT): Eulogio Ditch    Encounter Date: 01/10/2020  PT End of Session - 01/10/20 1249    Visit Number  28    Number of Visits  36    Date for PT Re-Evaluation  01/24/20    Authorization Type  8/10 PN on 11/29/19    PT Start Time  1100    PT Stop Time  1141    PT Time Calculation (min)  41 min    Equipment Utilized During Treatment  Gait belt    Activity Tolerance  Patient tolerated treatment well;Patient limited by fatigue;Patient limited by pain    Behavior During Therapy  WFL for tasks assessed/performed       Past Medical History:  Diagnosis Date  . Allergic rhinitis   . Allergy   . Anemia   . Anxiety   . Arthritis   . Back pain   . Bradycardia   . Breast mass    Patient can no longer palpate specific masses but showed tech general area of concern  . CHF (congestive heart failure) ([place])   . CKD (chronic kidney disease)    STAGE 3  . Constipation   . Diabetes mellitus without complication ([place])   . Diabetic nephropathy ([place])   . Dyspnea   . GERD (gastroesophageal reflux disease)   . Heart murmur   . HLD (hyperlipidemia)   . Hyperparathyroidism ([place])   . Hypertension   . Joint pain   . Leg edema   . Legally blind in left eye, as defined in [place]   . Lymphedema   . Obesity   . Onychomycosis     Past Surgical History:  Procedure Laterality Date  . ABDOMINAL HYSTERECTOMY    . AMPUTATION Left 05/05/2019   Procedure: AMPUTATION BELOW KNEE;  Surgeon: Katha Cabal, MD;  Location: [facility] ORS;  Service: Vascular;  Laterality: Left;  . BREAST BIOPSY Left 2014   FNA 12:00 position - Negative  . EYE SURGERY Left 2007   removed a lens, no lens implanted  . IR FLUORO GUIDE CV LINE RIGHT   12/29/2018  . IR REMOVAL TUN CV CATH W/O FL  02/19/2019  . IR US GUIDE VASC ACCESS RIGHT  12/29/2018  . TEE WITHOUT CARDIOVERSION N/A 12/28/2018   Procedure: TRANSESOPHAGEAL ECHOCARDIOGRAM (TEE);  Surgeon: Sueanne Margarita, MD;  Location: [facility] ENDOSCOPY;  Service: Cardiovascular;  Laterality: N/A;  . VAGINAL HYSTERECTOMY     abdominal hyst, not a vaginal hyst    There were no vitals filed for this visit.  Subjective Assessment - 01/10/20 1106    Subjective  Patient reports she fell when getting out of her car last week. Was able to get herself up without help. Is on new BP medication. Is feeling dizzy    Patient is accompained by:  --   ex husband   Pertinent History  Patient is a pleasant 59 year old female who presents for gait training/ stability for L BKA. Recently obtained her prosthesis last Friday 08/06/19 and has not worn it since the prosthetists office. PMH includes anemia, anxiety, arthritis, back pain, bradycardia, CHF, CKD stage II, DM, diabetic nephropathy, dyspnea, GERD, HLD, hyperparathyroidism, hypotension, legally blind in left eye, lymphedema, obesity, onychomycosis. Left BKA performed 05/05/19, noted  in history that patient had a fall after procedure injuring the anterior portion of the suture line. Currently using a manual chair to get around the home. At this time she can't get into bathroom due to wheelchair, is waiting for a handicap apartment.    Limitations  Lifting;Standing;Walking;House hold activities    How long can you sit comfortably?  n/a    How long can you stand comfortably?  only stood once with prosthetist    How long can you walk comfortably?  not able to walk yet.    Patient Stated Goals  to walk again with prosthesis    Currently in Pain?  Yes    Pain Score  3     Pain Location  Knee    Pain Orientation  Right    Pain Descriptors / Indicators  Aching    Pain Type  Chronic pain    Pain Onset  More than a month ago    Pain Frequency  Constant    Aggravating  Factors   weightbearing    Pain Relieving Factors  rest             Patient reports she fell when getting out of her car last week. Was able to get herself up without help. Is on new BP medication. Is feeling dizzy   Vitals 141/65 pulse 70   Sit to stand from plinth table with quad cane, cueing for uptight posture, occasional mod A for remaining upright   standing with marches holding quad cane close CGA-terminated due to dizziness ; HR spike >100 brought back to 89 immediately upon sitting.   Supine SLR with opp LE in hooklying 10x  Straight leg abduction 10x each LE, opp LE is hooklying  Bridging with stabilization to prosthetic limb 10x, cueing for gluteal squeeze   sidelying Hip extension with PT stabilization of foot 10x each LE, challenging to move foot past midline.  Hip flexor sidelying stretch: hold 60 seconds (added to HEP)  Seated:  BTB adduciton single limb at a time 12x each LE BTB hamstring curls 15x each LE    Patient requires additional sock to prosthesis for proper fit. Cueing required for keeping socks without wrinkles and pulling tighter.              PT Education - 01/10/20 1249    Education Details  exercise technique, body mechanics    Person(s) Educated  Patient    Methods  Explanation;Demonstration;Tactile cues;Verbal cues    Comprehension  Verbalized understanding;Returned demonstration;Verbal cues required;Tactile cues required       PT Short Term Goals - 11/29/19 1135      PT SHORT TERM GOAL #1   Title  Patient will be independent in home exercise program to improve strength/mobility for better functional independence with ADLs.    Baseline  10/7: HEP given; 11/23: HEP compliant 1/25: HEP compliant, progressing with increasing challenge of HEP    Time  2    Period  Weeks    Status  Partially Met    Target Date  12/13/19        PT Long Term Goals - 11/29/19 1121      PT LONG TERM GOAL #1   Title  Patient will tolerate  static standing for > 5 minutes for standing ADL performance with weight acceptance onto prosthetic limb to improve quality of life and mobility.    Baseline  10/7: 1 minute 39 seconds with heavy UE support on RW 11/23: 1  minute 50 seconds static stand terminated due to back pain not knees. , 3 minutes of walking/dynamic standing. 11/29/19: 3 min 28 second due to back pain    Time  8    Period  Weeks    Status  Partially Met    Target Date  01/24/20      PT LONG TERM GOAL #2   Title  Patient will ambulate >100 feet with RW, mod I with right prosthetic exhibiting reciprocal gait pattern with good  management of prosthetic and good safety awareness for improved home ambulation    Baseline  10/7: unable to ambulate 11/23: 86 ft with RW 11/29/19: able to ambulate 100 ft with RW    Time  8    Period  Weeks    Status  Achieved      PT LONG TERM GOAL #3   Title  Patient (< 18 years old) will complete five times sit to stand test in < 10 seconds indicating an increased LE strength and improved balance.    Baseline  10/7: 21 seconds with heavy BUE support from raised surface 11/23: 16 seconds with BUE support and RW 1/25; 14 seconds with one hand on chair one hand on RW    Time  8    Period  Weeks    Status  Partially Met    Target Date  01/24/20      PT LONG TERM GOAL #4   Title  Patient will increase BLE gross strength to 4+/5 as to improve functional strength for independent gait, increased standing tolerance and increased ADL ability.    Baseline  10/7: L 3+/5 R 4-/5 11/23: L 4-/5 R 4/5 with knee 4-/5 due to pain 1/25: L 4/5 gross with 3/5 IR R 4+/5 gross with 4/5 ext/flex 3+/IR    Time  8    Period  Weeks    Status  Partially Met    Target Date  01/24/20      PT LONG TERM GOAL #5   Title  Patient will ambulate 50 ft with a quad cane for with CGA or less assistance for ambulating around house and safe performance of ADLs/iADLs.    Baseline  1/25: requires use of quad cane in // bars; unable  to ambulate outside of // bars    Time  8    Period  Weeks    Status  New    Target Date  01/24/20            Plan - 01/10/20 1251    Clinical Impression Statement  Patient presents with increased dizziness and fatigue due to changes in medication. Vitals monitored throughout session and standing interventions terminated due to dizziness. Sidelying hip flexor stretch added to HEP. Patient would benefit from skilled PT intervention to increase strength, stability, and gait mechanics in order to improve quality of life and decrease fall risk.    Personal Factors and Comorbidities  Age;Comorbidity 3+;Education;Finances;Fitness;Past/Current Experience;Social Background;Time since onset of injury/illness/exacerbation;Other    Comorbidities  anemia, anxiety, arthritis, back pain, bradycardia, CHF, CKD stage III, DM, diabetic nephropathy, dyspnea, GERD, heart murmer, HLD, hyperparathyroidism, HTN, blind in L eye, obesity, oncychomycosis    Examination-Activity Limitations  Bathing;Bed Mobility;Caring for Others;Bend;Carry;Dressing;Continence;Hygiene/Grooming;Stairs;Squat;Reach Overhead;Locomotion Level;Lift;Stand;Toileting;Transfers    Examination-Participation Restrictions  Church;Cleaning;Community Activity;Interpersonal Relationship;Driving;Laundry;Volunteer;Shop;Personal Finances;Meal Prep;Yard Work;Other    Stability/Clinical Decision Making  Evolving/Moderate complexity    Rehab Potential  Fair    PT Frequency  2x / week    PT Duration  8  weeks    PT Treatment/Interventions  ADLs/Self Care Home Management;Aquatic Therapy;Biofeedback;Cryotherapy;Electrical Stimulation;Iontophoresis 62m/ml Dexamethasone;Moist Heat;Traction;Ultrasound;Therapeutic activities;Functional mobility training;Stair training;Gait training;DME Instruction;Therapeutic exercise;Balance training;Neuromuscular re-education;Patient/family education;Manual techniques;Wheelchair mTax adviserCompression  bandaging;Scar mobilization;Passive range of motion;Dry needling;Energy conservation;Splinting;Taping    PT Next Visit Plan  walk with QC    PT Home Exercise Plan  prone 10 minutes a day, wear prosthesis 1 hour a day, side lie posterior kick backs 10x    Consulted and Agree with Plan of Care  Patient       Patient will benefit from skilled therapeutic intervention in order to improve the following deficits and impairments:  Abnormal gait, Cardiopulmonary status limiting activity, Decreased activity tolerance, Decreased balance, Decreased knowledge of precautions, Decreased endurance, Decreased coordination, Decreased knowledge of use of DME, Decreased mobility, Decreased range of motion, Decreased safety awareness, Difficulty walking, Decreased strength, Hypomobility, Impaired flexibility, Impaired perceived functional ability, Impaired sensation, Obesity, Prosthetic Dependency, Postural dysfunction, Improper body mechanics, Pain  Visit Diagnosis: Other abnormalities of gait and mobility  Muscle weakness (generalized)  Unsteadiness on feet     Problem List Patient Active Problem List   Diagnosis Date Noted  . Acidosis 09/20/2019  . Benign hypertensive kidney disease with chronic kidney disease 09/20/2019  . Proteinuria 09/20/2019  . Secondary hyperparathyroidism of renal origin ([place] 09/20/2019  . Cataract 09/02/2019  . Chronic pain syndrome 07/13/2019  . Wound dehiscence 05/29/2019  . Hx of BKA, left ([place] 05/24/2019  . PVD (peripheral vascular disease) ([place] 05/13/2019  . Chronic osteomyelitis of ankle and foot, left ([place] 05/05/2019  . Chronic diastolic CHF (congestive heart failure) ([place] 04/19/2019  . Leg edema 04/19/2019  . Acute bacterial endocarditis 04/19/2019  . Chest pain of uncertain etiology 011/57/2620 . Ulcer of left ankle ([place] 04/14/2019  . Ankle deformity, left 01/21/2019  . Diabetic polyneuropathy associated with type 2 diabetes mellitus ([place] 01/21/2019  .  Aortic valve endocarditis   . Bacteremia due to Streptococcus 12/24/2018  . Diabetic ulcer of left ankle associated with diabetes mellitus due to underlying condition, with fat layer exposed ([place] 11/09/2018  . Malignant hypertension (arteriolar nephrosclerosis), stage 1-4 or unspecified chronic kidney disease 07/15/2018  . Stage 4 chronic kidney disease ([place] 07/15/2018  . Morbid obesity with BMI of 45.0-49.9, adult ([place] 07/15/2018  . Excessive daytime sleepiness 07/15/2018  . Other fatigue 03/11/2018  . Essential hypertension 03/11/2018  . Vitamin D deficiency 03/11/2018  . Congestive heart failure ([place] 03/11/2018  . Primary osteoarthritis of right knee 01/06/2018  . Uncontrolled type 2 diabetes mellitus with polyneuropathy ([place] 10/03/2017  . Chronic pain of right knee 10/02/2017  . Obesity 10/02/2017  . Charcot's joint of left foot 08/29/2017  . Lymphedema 09/06/2016  . Hyponatremia with extracellular fluid depletion 02/02/2016  . Nausea with vomiting, unspecified 02/02/2016  . Closed nondisplaced fracture of right patella 08/23/2015  . Acute cystitis without hematuria 08/14/2014  . Colitis 08/14/2014  . HTN (hypertension), malignant 08/14/2014  . Acute renal failure superimposed on stage 3 chronic kidney disease (HF[place] 06/26/2014  . Hyperparathyroidism, unspecified ([place] 02/24/2014  . Allergic rhinitis 02/22/2014  . Onychomycosis 02/22/2014  . Anxiety 11/05/2013  . Bradycardia 05/14/2013  . Gastro-esophageal reflux disease without esophagitis 04/05/2013  . Anemia in other chronic diseases classified elsewhere 01/27/2013   MJanna Arch PT, DPT   01/10/2020, 12:54 PM  [place] [facility] [address] [address]Phone: [phone]  Fax:  [phone] Name: CALVETA QUINTELAMRN:  [id number] Date of Birth: [birth year]

## 2020-01-12 ENCOUNTER — Ambulatory Visit: Payer: Medicare Other

## 2020-01-17 ENCOUNTER — Ambulatory Visit: Payer: Medicare Other

## 2020-01-17 IMAGING — MR MR [PERSON_NAME] LOW W/O CM*L*
5 series · 37 of 40 positions shown · non-contrast
Comparison: None.

CLINICAL DATA: Diabetic patient with left lower leg pain and
swelling. Chronic wound over the lateral malleolus of the left
ankle.

EXAM:
MRI OF LOWER LEFT EXTREMITY WITHOUT CONTRAST
TECHNIQUE: Multiplanar, multisequence MR imaging of the left lower leg was
performed. No intravenous contrast was administered.

[Series 3: STIR · coronal · 4.0mm · 0.98mm/px · 5 of 33 slices shown (1 of 2)]
[im 1/33]
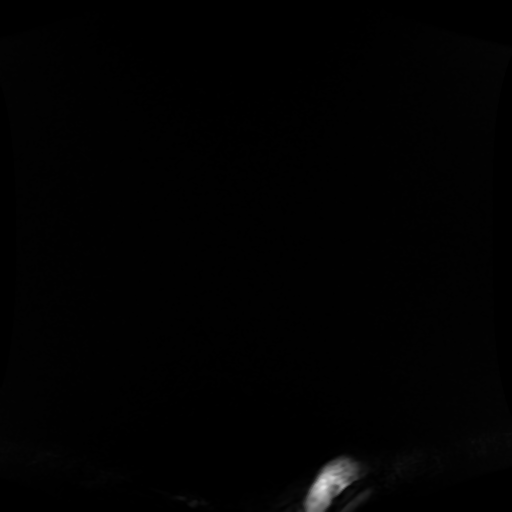
[im 9/33]
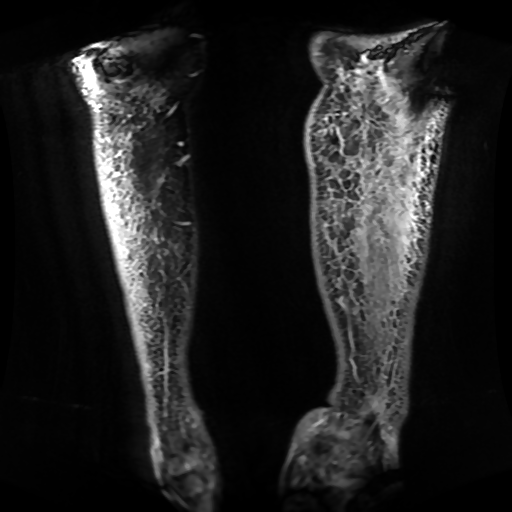
[im 17/33]
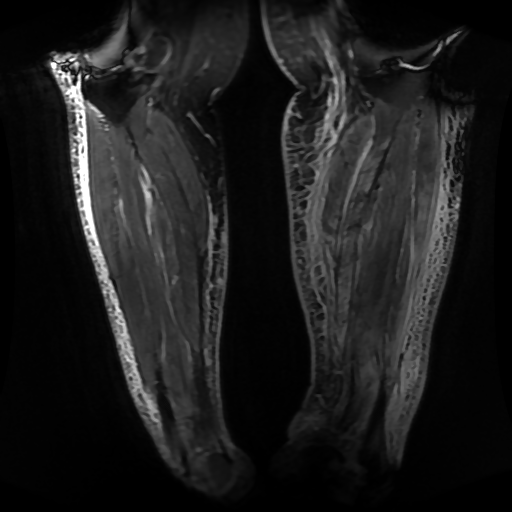
[im 25/33]
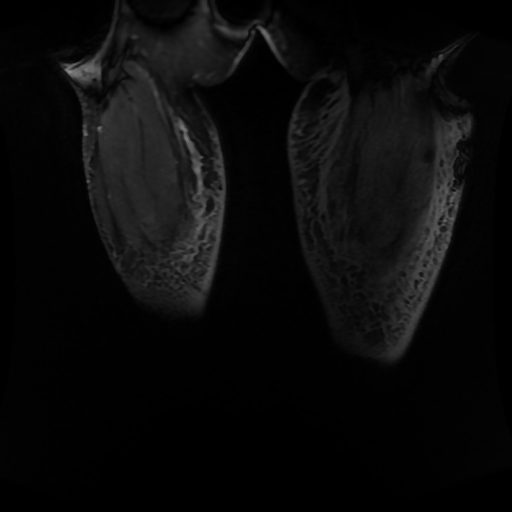
[im 33/33]
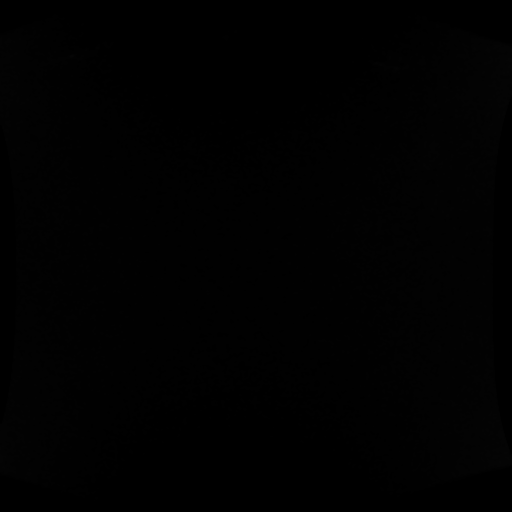

[Series 4: T1 · coronal · 4.0mm · 1.95mm/px · 5 of 33 slices shown (1 of 2)]
[im 1/33]
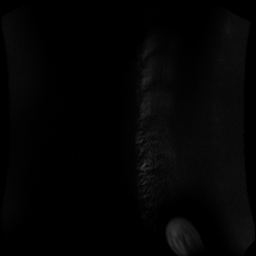
[im 9/33]
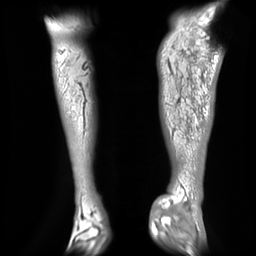
[im 17/33]
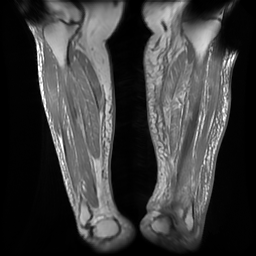
[im 25/33]
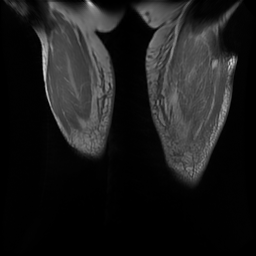
[im 33/33]
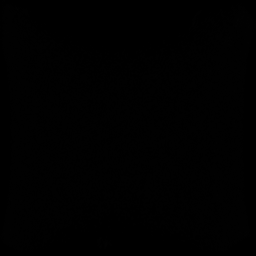

[Series 5: T1 · axial · 4.0mm · 1.02mm/px · z∈[-203,+217]mm · 9 of 86 slices shown (2 of 2)]
[im 1/86]
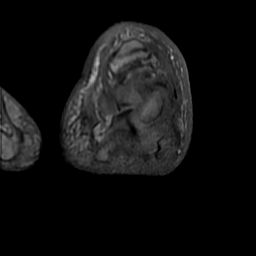
[im 16/86]
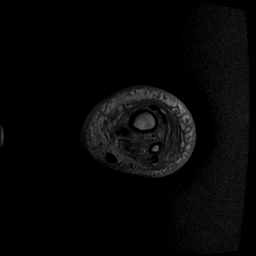
[im 24/86]
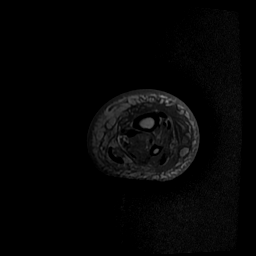
[im 39/86]
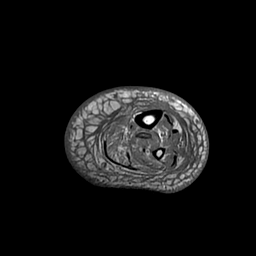
[im 47/86]
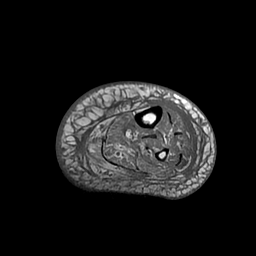
[im 62/86]
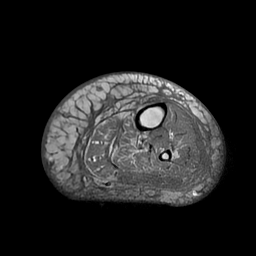
[im 70/86]
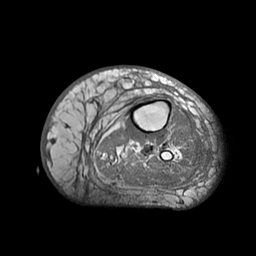
[im 78/86]
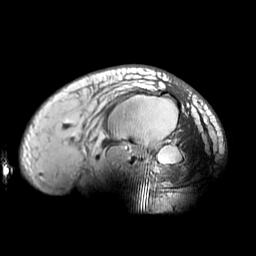
[im 86/86]
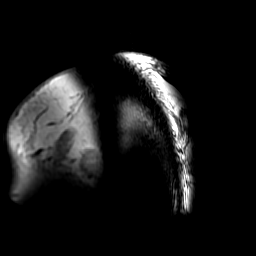

[Series 6: T2 fat-sat · axial · 4.0mm · 1.02mm/px · z∈[-203,+217]mm · 12 of 86 slices shown]
[im 1/86]
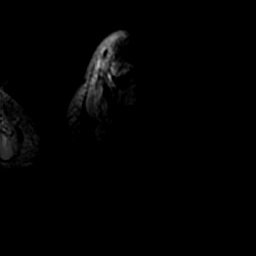
[im 8/86]
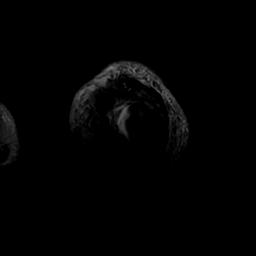
[im 16/86]
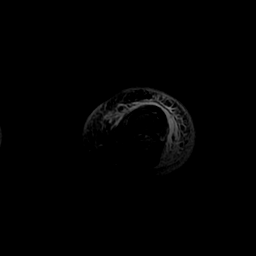
[im 24/86]
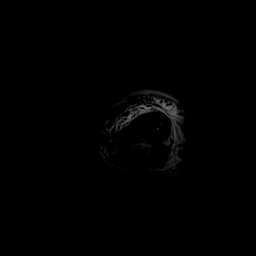
[im 31/86]
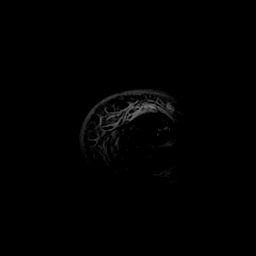
[im 39/86]
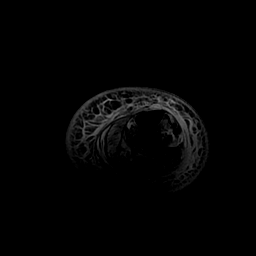
[im 47/86]
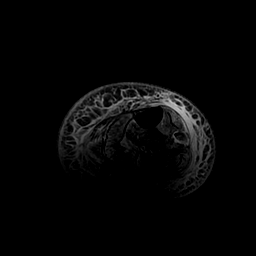
[im 55/86]
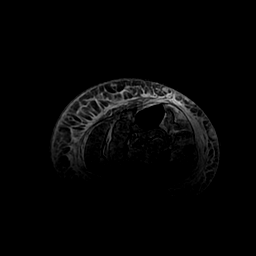
[im 62/86]
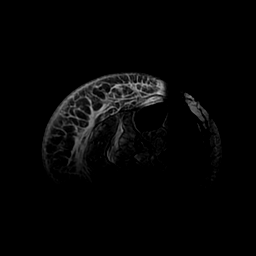
[im 70/86]
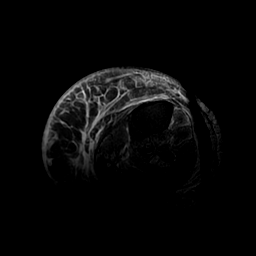
[im 78/86]
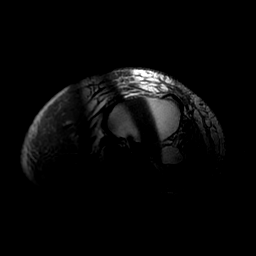
[im 86/86]
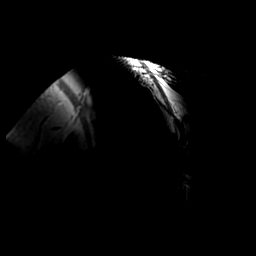

[Series 7: STIR · sagittal · 4.0mm · 0.98mm/px · 6 of 39 slices shown (2 of 2)]
[im 1/39]
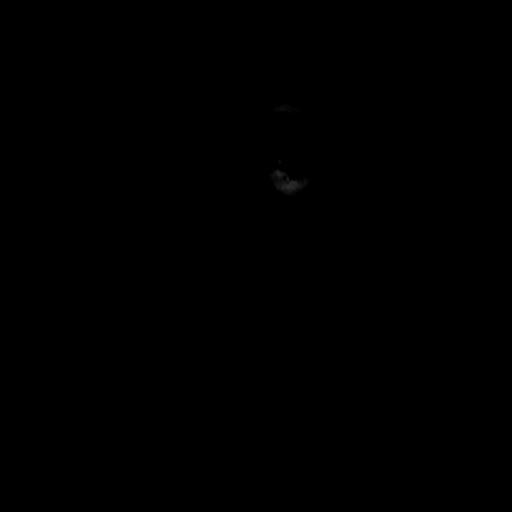
[im 8/39]
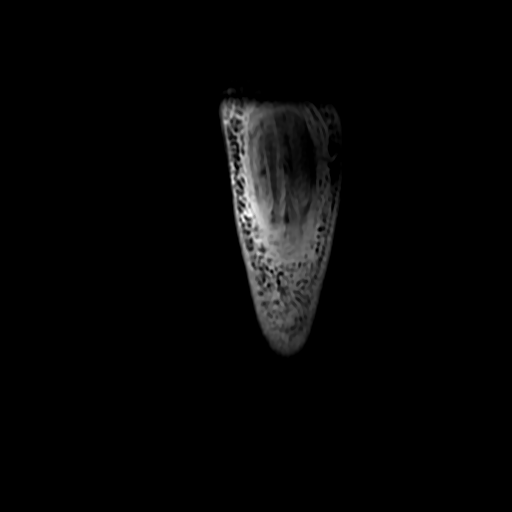
[im 16/39]
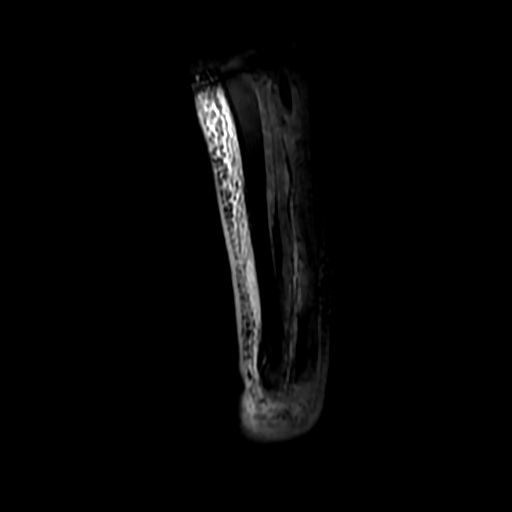
[im 23/39]
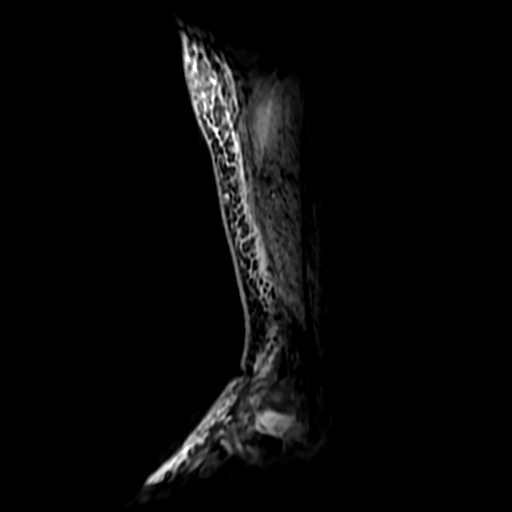
[im 31/39]
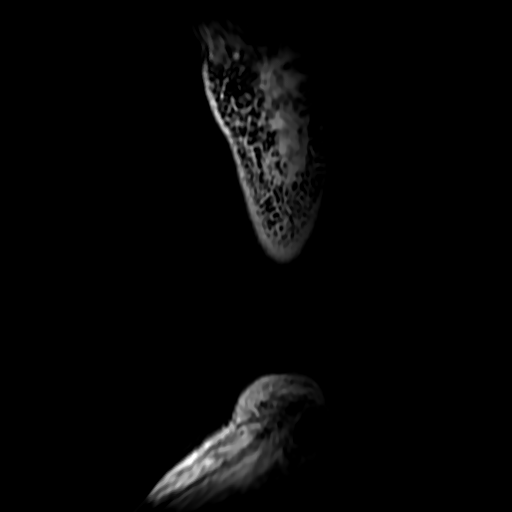
[im 39/39]
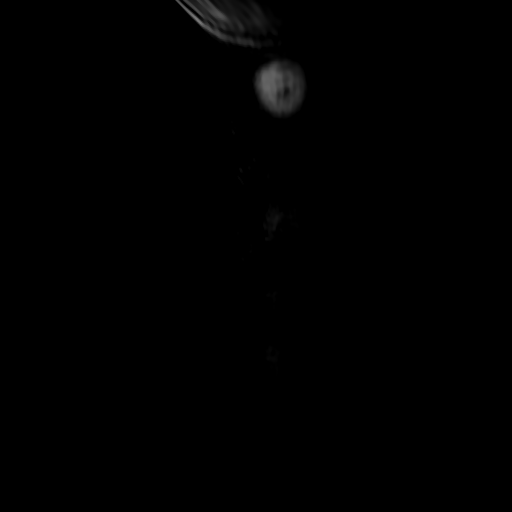

[37 of 40 positions shown; findings below may reference images not displayed]

FINDINGS: Bones/Joint/Cartilage

Marrow edema in the distal 5 cm of the tibia and fibula is
identified as seen on the patient's ankle MRI yesterday. Marrow
signal is otherwise normal.

Ligaments

Negative.

Muscles and Tendons

There is mild fatty atrophy of intrinsic musculature of the lower
leg. Intermediate increased T2 signal in all lower leg musculature
is compatible with diabetic myopathy/denervation. A small amount of
fluid is seen interposed between the soleus and medial
gastrocnemius. No intramuscular fluid collection. No muscle tear or
strain.

Soft tissues

Intense subcutaneous edema about the lower legs bilaterally appears
worse on the left.
IMPRESSION: Marrow edema in the distal 5 cm of the tibia and fibula as seen on
MRI yesterday could be secondary to stress change or osteomyelitis.
Marrow signal is otherwise normal.

Intense subcutaneous edema in the lower legs bilaterally is worse on
the left and compatible with dependent change and/or cellulitis.

Small volume of fluid interposed between the medial head of
gastrocnemius and soleus could be due to dependent change or
infection.

Intermediate increased T2 signal in all lower leg musculature is
most consistent with diabetic myopathy and denervation. No
intramuscular fluid collection.

## 2020-01-19 ENCOUNTER — Other Ambulatory Visit: Payer: Self-pay

## 2020-01-19 ENCOUNTER — Ambulatory Visit: Payer: Medicare Other

## 2020-01-19 DIAGNOSIS — R2681 Unsteadiness on feet: Secondary | ICD-10-CM

## 2020-01-19 DIAGNOSIS — M6281 Muscle weakness (generalized): Secondary | ICD-10-CM

## 2020-01-19 DIAGNOSIS — R2689 Other abnormalities of gait and mobility: Secondary | ICD-10-CM | POA: Diagnosis not present

## 2020-01-19 NOTE — Therapy (Signed)
[place] MAIN [facility] SERVICES [address] [place], [place], [address] Phone: [phone]   Fax:  [phone]  Physical Therapy Treatment  Patient Details  Name: Rachel Holmes MRN: [id number] Date of Birth: 10/07/1961 Referring Provider (PT): Eulogio Ditch    Encounter Date: 01/19/2020  PT End of Session - 01/19/20 1212    Visit Number  29    Number of Visits  36    Date for PT Re-Evaluation  01/24/20    Authorization Type  9/10 PN on 11/29/19    PT Start Time  1106    PT Stop Time  1145    PT Time Calculation (min)  39 min    Equipment Utilized During Treatment  Gait belt    Activity Tolerance  Patient tolerated treatment well;Patient limited by fatigue    Behavior During Therapy  [facility] for tasks assessed/performed       Past Medical History:  Diagnosis Date  . Allergic rhinitis   . Allergy   . Anemia   . Anxiety   . Arthritis   . Back pain   . Bradycardia   . Breast mass    Patient can no longer palpate specific masses but showed tech general area of concern  . CHF (congestive heart failure) ([place])   . CKD (chronic kidney disease)    STAGE 3  . Constipation   . Diabetes mellitus without complication ([place])   . Diabetic nephropathy ([place])   . Dyspnea   . GERD (gastroesophageal reflux disease)   . Heart murmur   . HLD (hyperlipidemia)   . Hyperparathyroidism ([place])   . Hypertension   . Joint pain   . Leg edema   . Legally blind in left eye, as defined in [place]   . Lymphedema   . Obesity   . Onychomycosis     Past Surgical History:  Procedure Laterality Date  . ABDOMINAL HYSTERECTOMY    . AMPUTATION Left 05/05/2019   Procedure: AMPUTATION BELOW KNEE;  Surgeon: Katha Cabal, MD;  Location: [facility] ORS;  Service: Vascular;  Laterality: Left;  . BREAST BIOPSY Left 2014   FNA 12:00 position - Negative  . EYE SURGERY Left 2007   removed a lens, no lens implanted  . IR FLUORO GUIDE CV LINE RIGHT  12/29/2018  . IR REMOVAL  TUN CV CATH W/O FL  02/19/2019  . IR US GUIDE VASC ACCESS RIGHT  12/29/2018  . TEE WITHOUT CARDIOVERSION N/A 12/28/2018   Procedure: TRANSESOPHAGEAL ECHOCARDIOGRAM (TEE);  Surgeon: Sueanne Margarita, MD;  Location: [facility] ENDOSCOPY;  Service: Cardiovascular;  Laterality: N/A;  . VAGINAL HYSTERECTOMY     abdominal hyst, not a vaginal hyst    There were no vitals filed for this visit.  Subjective Assessment - 01/19/20 1209    Subjective  Patient returns to PT after missing past sessions. Reports mondays are hard for her to come to PT due to being busy over the weekend. Bumped her arm at her friends house but no falls since last PT session.    Patient is accompained by:  --   ex husband   Pertinent History  Patient is a pleasant 59 year old female who presents for gait training/ stability for L BKA. Recently obtained her prosthesis last Friday 08/06/19 and has not worn it since the prosthetists office. PMH includes anemia, anxiety, arthritis, back pain, bradycardia, CHF, CKD stage II, DM, diabetic nephropathy, dyspnea, GERD, HLD, hyperparathyroidism, hypotension, legally blind in left eye,  lymphedema, obesity, onychomycosis. Left BKA performed 05/05/19, noted in history that patient had a fall after procedure injuring the anterior portion of the suture line. Currently using a manual chair to get around the home. At this time she can't get into bathroom due to wheelchair, is waiting for a handicap apartment.    Limitations  Lifting;Standing;Walking;House hold activities    How long can you sit comfortably?  n/a    How long can you stand comfortably?  only stood once with prosthetist    How long can you walk comfortably?  not able to walk yet.    Patient Stated Goals  to walk again with prosthesis    Currently in Pain?  No/denies           Vitals at start of session: 159/66 pulse 66   Standing ambulate 96 ft with RW and cueing for placement of self within walker, upright  Posture, weight shift and  prosthetic clearance. Heavy reliance upon UE's. .  2 laps total ; one near LOB   10x STS with hands on knees from plinth table   Static marching with RW; focus on weight shift and prosthetic fit    seated exercises between laps: cueing for body mechanics/sequencing  BTB rows 15x  RTB adduction 15x each LE  marching with core contraction, tall posture and arms crossed, 12x each LE  RTB around ankle and prosthetic ankle: ER/IR 10x each LE  Straight leg abd/add 12x each LE Modified windmills 10x each side,      Pt educated throughout session about proper posture and technique with exercises. Improved exercise technique, movement at target joints, use of target muscles after min to mod verbal, visual, tactile cues.      Patient educated on need for compliance of attendance to therapy sessions for progress. Due to frequency of last minute cancellations and limited compliance patient will be decreased to one time a week. Patient verbalizes understanding.                      PT Education - 01/19/20 1211    Education Details  compliance with attendance    Person(s) Educated  Patient    Methods  Explanation    Comprehension  Verbalized understanding       PT Short Term Goals - 11/29/19 1135      PT SHORT TERM GOAL #1   Title  Patient will be independent in home exercise program to improve strength/mobility for better functional independence with ADLs.    Baseline  10/7: HEP given; 11/23: HEP compliant 1/25: HEP compliant, progressing with increasing challenge of HEP    Time  2    Period  Weeks    Status  Partially Met    Target Date  12/13/19        PT Long Term Goals - 11/29/19 1121      PT LONG TERM GOAL #1   Title  Patient will tolerate static standing for > 5 minutes for standing ADL performance with weight acceptance onto prosthetic limb to improve quality of life and mobility.    Baseline  10/7: 1 minute 39 seconds with heavy UE support on RW 11/23: 1  minute 50 seconds static stand terminated due to back pain not knees. , 3 minutes of walking/dynamic standing. 11/29/19: 3 min 28 second due to back pain    Time  8    Period  Weeks    Status  Partially Met  Target Date  01/24/20      PT LONG TERM GOAL #2   Title  Patient will ambulate >100 feet with RW, mod I with right prosthetic exhibiting reciprocal gait pattern with good  management of prosthetic and good safety awareness for improved home ambulation    Baseline  10/7: unable to ambulate 11/23: 86 ft with RW 11/29/19: able to ambulate 100 ft with RW    Time  8    Period  Weeks    Status  Achieved      PT LONG TERM GOAL #3   Title  Patient (< 82 years old) will complete five times sit to stand test in < 10 seconds indicating an increased LE strength and improved balance.    Baseline  10/7: 21 seconds with heavy BUE support from raised surface 11/23: 16 seconds with BUE support and RW 1/25; 14 seconds with one hand on chair one hand on RW    Time  8    Period  Weeks    Status  Partially Met    Target Date  01/24/20      PT LONG TERM GOAL #4   Title  Patient will increase BLE gross strength to 4+/5 as to improve functional strength for independent gait, increased standing tolerance and increased ADL ability.    Baseline  10/7: L 3+/5 R 4-/5 11/23: L 4-/5 R 4/5 with knee 4-/5 due to pain 1/25: L 4/5 gross with 3/5 IR R 4+/5 gross with 4/5 ext/flex 3+/IR    Time  8    Period  Weeks    Status  Partially Met    Target Date  01/24/20      PT LONG TERM GOAL #5   Title  Patient will ambulate 50 ft with a quad cane for with CGA or less assistance for ambulating around house and safe performance of ADLs/iADLs.    Baseline  1/25: requires use of quad cane in // bars; unable to ambulate outside of // bars    Time  8    Period  Weeks    Status  New    Target Date  01/24/20            Plan - 01/19/20 1214    Clinical Impression Statement  Patient educated on need for compliance of  attendance to therapy sessions for progress. Due to frequency of last minute cancellations and limited compliance patient will be decreased to one time a week. Patient verbalizes understanding. Will re-assess in a few weeks to determine if patient will require new POC. Patient fatigued with repeated ambulation distances with frequent running into of objects on L side due to visual field deficit. Patient would benefit from skilled PT intervention to increase strength, stability, and gait mechanics in order to improve quality of life and decrease fall risk    Personal Factors and Comorbidities  Age;Comorbidity 3+;Education;Finances;Fitness;Past/Current Experience;Social Background;Time since onset of injury/illness/exacerbation;Other    Comorbidities  anemia, anxiety, arthritis, back pain, bradycardia, CHF, CKD stage III, DM, diabetic nephropathy, dyspnea, GERD, heart murmer, HLD, hyperparathyroidism, HTN, blind in L eye, obesity, oncychomycosis    Examination-Activity Limitations  Bathing;Bed Mobility;Caring for Others;Bend;Carry;Dressing;Continence;Hygiene/Grooming;Stairs;Squat;Reach Overhead;Locomotion Level;Lift;Stand;Toileting;Transfers    Examination-Participation Restrictions  Church;Cleaning;Community Activity;Interpersonal Relationship;Driving;Laundry;Volunteer;Shop;Personal Finances;Meal Prep;Yard Work;Other    Stability/Clinical Decision Making  Evolving/Moderate complexity    Rehab Potential  Fair    PT Frequency  2x / week    PT Duration  8 weeks    PT Treatment/Interventions  ADLs/Self Care Home  Management;Aquatic Therapy;Biofeedback;Cryotherapy;Electrical Stimulation;Iontophoresis 2m/ml Dexamethasone;Moist Heat;Traction;Ultrasound;Therapeutic activities;Functional mobility training;Stair training;Gait training;DME Instruction;Therapeutic exercise;Balance training;Neuromuscular re-education;Patient/family education;Manual techniques;Wheelchair mTax adviserCompression  bandaging;Scar mobilization;Passive range of motion;Dry needling;Energy conservation;Splinting;Taping    PT Next Visit Plan  recert    PT Home Exercise Plan  prone 10 minutes a day, wear prosthesis 1 hour a day, side lie posterior kick backs 10x    Consulted and Agree with Plan of Care  Patient       Patient will benefit from skilled therapeutic intervention in order to improve the following deficits and impairments:  Abnormal gait, Cardiopulmonary status limiting activity, Decreased activity tolerance, Decreased balance, Decreased knowledge of precautions, Decreased endurance, Decreased coordination, Decreased knowledge of use of DME, Decreased mobility, Decreased range of motion, Decreased safety awareness, Difficulty walking, Decreased strength, Hypomobility, Impaired flexibility, Impaired perceived functional ability, Impaired sensation, Obesity, Prosthetic Dependency, Postural dysfunction, Improper body mechanics, Pain  Visit Diagnosis: Other abnormalities of gait and mobility  Muscle weakness (generalized)  Unsteadiness on feet     Problem List Patient Active Problem List   Diagnosis Date Noted  . Acidosis 09/20/2019  . Benign hypertensive kidney disease with chronic kidney disease 09/20/2019  . Proteinuria 09/20/2019  . Secondary hyperparathyroidism of renal origin ([place] 09/20/2019  . Cataract 09/02/2019  . Chronic pain syndrome 07/13/2019  . Wound dehiscence 05/29/2019  . Hx of BKA, left ([place] 05/24/2019  . PVD (peripheral vascular disease) ([place] 05/13/2019  . Chronic osteomyelitis of ankle and foot, left ([place] 05/05/2019  . Chronic diastolic CHF (congestive heart failure) ([place] 04/19/2019  . Leg edema 04/19/2019  . Acute bacterial endocarditis 04/19/2019  . Chest pain of uncertain etiology 056/31/4970 . Ulcer of left ankle ([place] 04/14/2019  . Ankle deformity, left 01/21/2019  . Diabetic polyneuropathy associated with type 2 diabetes mellitus ([place] 01/21/2019  . Aortic  valve endocarditis   . Bacteremia due to Streptococcus 12/24/2018  . Diabetic ulcer of left ankle associated with diabetes mellitus due to underlying condition, with fat layer exposed ([place] 11/09/2018  . Malignant hypertension (arteriolar nephrosclerosis), stage 1-4 or unspecified chronic kidney disease 07/15/2018  . Stage 4 chronic kidney disease ([place] 07/15/2018  . Morbid obesity with BMI of 45.0-49.9, adult ([place] 07/15/2018  . Excessive daytime sleepiness 07/15/2018  . Other fatigue 03/11/2018  . Essential hypertension 03/11/2018  . Vitamin D deficiency 03/11/2018  . Congestive heart failure ([place] 03/11/2018  . Primary osteoarthritis of right knee 01/06/2018  . Uncontrolled type 2 diabetes mellitus with polyneuropathy ([place] 10/03/2017  . Chronic pain of right knee 10/02/2017  . Obesity 10/02/2017  . Charcot's joint of left foot 08/29/2017  . Lymphedema 09/06/2016  . Hyponatremia with extracellular fluid depletion 02/02/2016  . Nausea with vomiting, unspecified 02/02/2016  . Closed nondisplaced fracture of right patella 08/23/2015  . Acute cystitis without hematuria 08/14/2014  . Colitis 08/14/2014  . HTN (hypertension), malignant 08/14/2014  . Acute renal failure superimposed on stage 3 chronic kidney disease ([place] 06/26/2014  . Hyperparathyroidism, unspecified ([place] 02/24/2014  . Allergic rhinitis 02/22/2014  . Onychomycosis 02/22/2014  . Anxiety 11/05/2013  . Bradycardia 05/14/2013  . Gastro-esophageal reflux disease without esophagitis 04/05/2013  . Anemia in other chronic diseases classified elsewhere 01/27/2013   MJanna Arch PT, DPT   01/19/2020, 12:15 PM  [facility] [address] [address]Phone: [phone]  Fax:  [phone] Name: CJELINA PAULSENMRN: [id number] of Birth: 501/02/1961

## 2020-01-20 ENCOUNTER — Ambulatory Visit
Admission: RE | Admit: 2020-01-20 | Discharge: 2020-01-20 | Disposition: A | Discharge status: Home / Self Care | Payer: Medicare Other | Source: Ambulatory Visit | Attending: Family Medicine | Admitting: Family Medicine

## 2020-01-20 DIAGNOSIS — Z1231 Encounter for screening mammogram for malignant neoplasm of breast: Secondary | ICD-10-CM

## 2020-01-20 IMAGING — DX DG CHEST 1V PORT SAME DAY
1 series · 1 of 1 positions shown · non-contrast
Comparison: December 23, 2018

CLINICAL DATA: Shortness of breath after transesophageal echo

EXAM:
PORTABLE CHEST 1 VIEW

[chest]
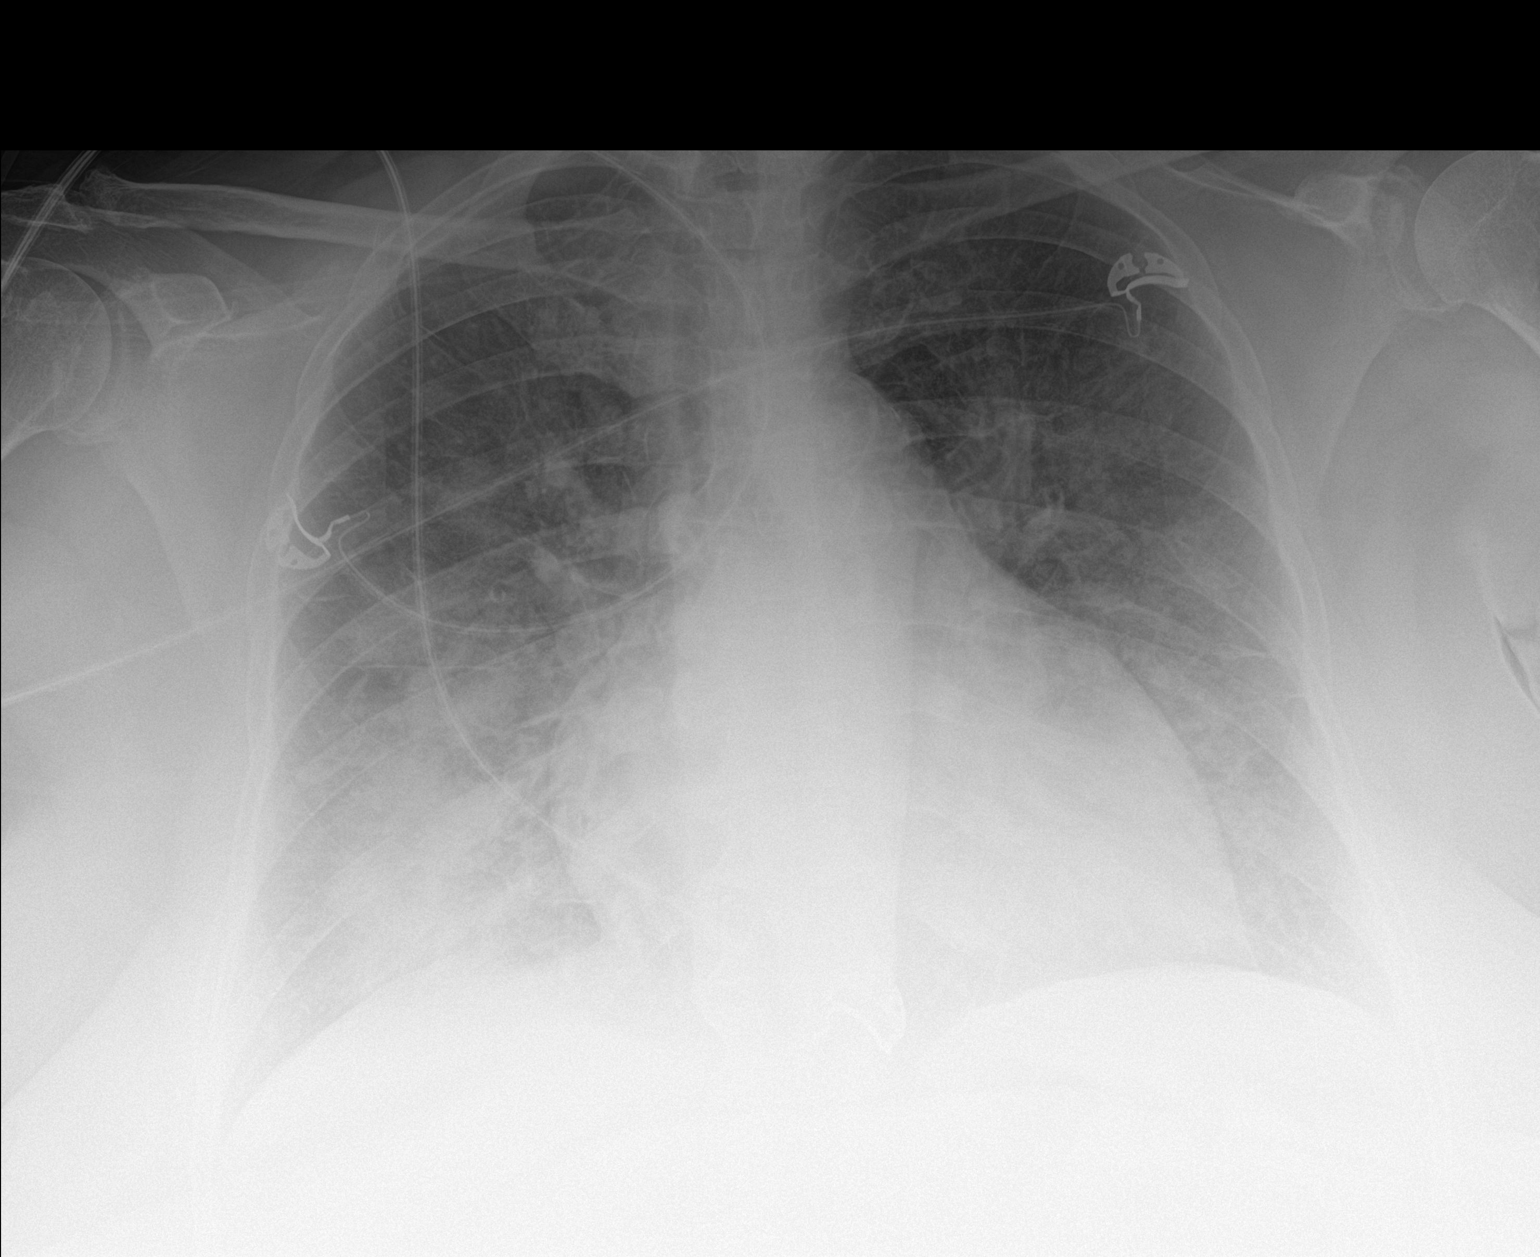

[1 of 1 positions shown; findings below may reference images not displayed]

FINDINGS: There is new infiltrate in the right base. There is mild new opacity
in left perihilar region and left base. No pneumothorax. No change
in the cardiomediastinal silhouette. No other acute abnormalities.
IMPRESSION: Bilateral pulmonary infiltrates worrisome for aspiration or
pneumonia. Recommend clinical correlation and follow-up to
resolution.

## 2020-01-21 IMAGING — DX DG CHEST 1V PORT
1 series · 1 of 1 positions shown · non-contrast
Comparison: 12/28/2018 and 12/23/2018

CLINICAL DATA: Shortness of breath.

EXAM:
PORTABLE CHEST 1 VIEW

[chest ap]
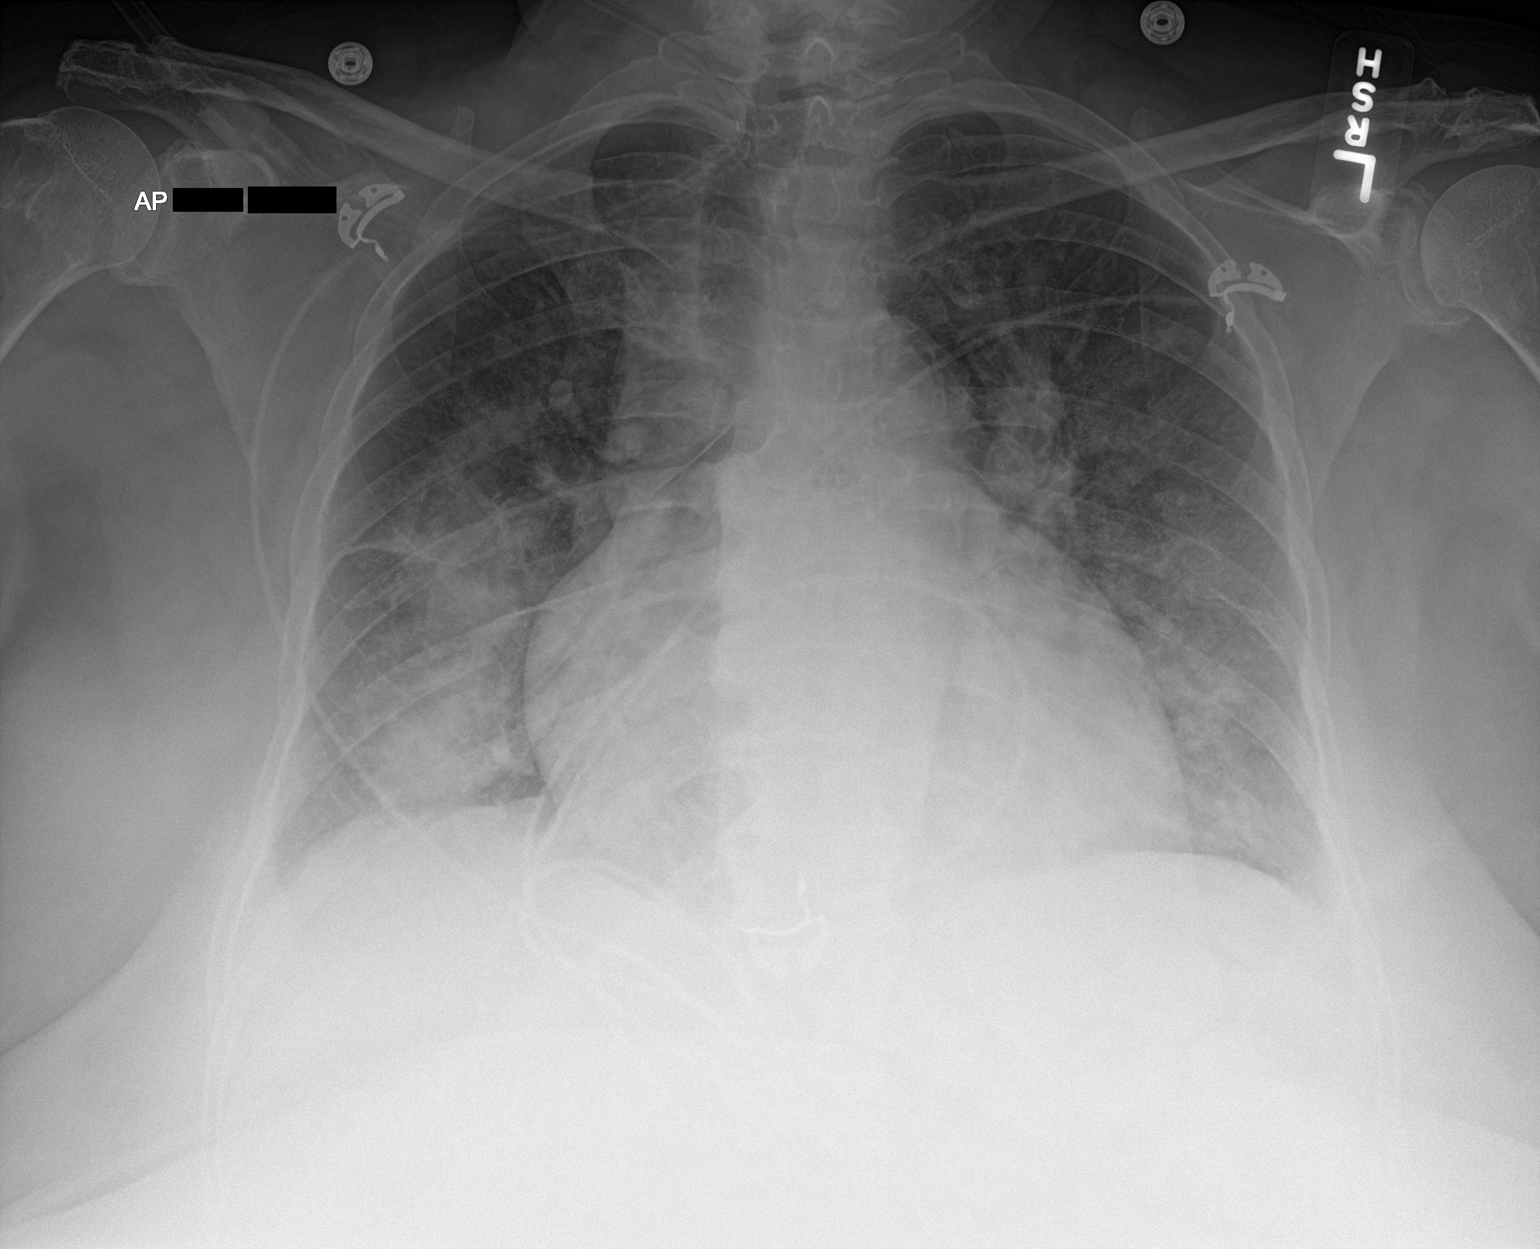

[1 of 1 positions shown; findings below may reference images not displayed]

FINDINGS: Again noted are bilateral airspace densities. Most confluent
airspace disease is located in the mid and lower right lung. Heart
size appears to be enlarged and stable. Negative for a pneumothorax.
Mild blunting at the costophrenic angles and difficult to exclude
small pleural effusions.
IMPRESSION: Minimal change in the bilateral airspace disease, right side greater
than left. Differential diagnosis includes pulmonary edema and/or
pneumonia. Question tiny pleural effusions.

## 2020-01-24 ENCOUNTER — Ambulatory Visit: Payer: Medicare Other

## 2020-01-25 ENCOUNTER — Ambulatory Visit: Payer: Medicare Other

## 2020-01-25 ENCOUNTER — Other Ambulatory Visit: Payer: Self-pay

## 2020-01-26 ENCOUNTER — Ambulatory Visit: Payer: Medicare Other

## 2020-01-26 DIAGNOSIS — M6281 Muscle weakness (generalized): Secondary | ICD-10-CM

## 2020-01-26 DIAGNOSIS — R2681 Unsteadiness on feet: Secondary | ICD-10-CM

## 2020-01-26 DIAGNOSIS — R2689 Other abnormalities of gait and mobility: Secondary | ICD-10-CM | POA: Diagnosis not present

## 2020-01-26 NOTE — Therapy (Signed)
[place] [facility] SERVICES [address] [place], [place], [address] Phone: [phone]   Fax:  [phone]  Physical Therapy Treatment/RECERT Physical Therapy Progress Note   Dates of reporting period  11/29/19   to   01/26/20  Patient Details  Name: Rachel Holmes MRN: [id number] Date of Birth: July 11, 1961 Referring Provider (PT): Eulogio Ditch    Encounter Date: 01/26/2020  PT End of Session - 01/26/20 1258    Visit Number  30    Number of Visits  38    Date for PT Re-Evaluation  03/22/20    Authorization Type  10/10 PN on 11/29/19; next session 1/10 PN on 01/26/20    PT Start Time  1113    PT Stop Time  1146    PT Time Calculation (min)  33 min    Equipment Utilized During Treatment  Gait belt    Activity Tolerance  Patient tolerated treatment well;Patient limited by fatigue    Behavior During Therapy  WFL for tasks assessed/performed       Past Medical History:  Diagnosis Date  . Allergic rhinitis   . Allergy   . Anemia   . Anxiety   . Arthritis   . Back pain   . Bradycardia   . Breast mass    Patient can no longer palpate specific masses but showed tech general area of concern  . CHF (congestive heart failure) ([place])   . CKD (chronic kidney disease)    STAGE 3  . Constipation   . Diabetes mellitus without complication ([employer])   . Diabetic nephropathy ([place])   . Dyspnea   . GERD (gastroesophageal reflux disease)   . Heart murmur   . HLD (hyperlipidemia)   . Hyperparathyroidism ([place])   . Hypertension   . Joint pain   . Leg edema   . Legally blind in left eye, as defined in [place]   . Lymphedema   . Obesity   . Onychomycosis     Past Surgical History:  Procedure Laterality Date  . ABDOMINAL HYSTERECTOMY    . AMPUTATION Left 05/05/2019   Procedure: AMPUTATION BELOW KNEE;  Surgeon: Katha Cabal, MD;  Location: [facility] ORS;  Service: Vascular;  Laterality: Left;  . BREAST BIOPSY Left 2014   FNA 12:00 position -  Negative  . EYE SURGERY Left 2007   removed a lens, no lens implanted  . IR FLUORO GUIDE CV LINE RIGHT  12/29/2018  . IR REMOVAL TUN CV CATH W/O FL  02/19/2019  . IR US GUIDE VASC ACCESS RIGHT  12/29/2018  . TEE WITHOUT CARDIOVERSION N/A 12/28/2018   Procedure: TRANSESOPHAGEAL ECHOCARDIOGRAM (TEE);  Surgeon: Sueanne Margarita, MD;  Location: [facility] ENDOSCOPY;  Service: Cardiovascular;  Laterality: N/A;  . VAGINAL HYSTERECTOMY     abdominal hyst, not a vaginal hyst    There were no vitals filed for this visit.  Subjective Assessment - 01/26/20 1250    Subjective  Patient arrived late to session limiting session duration due to difficulty with parking. Upon arriving at session requests water, forgot multiple things, and requires cueing for task orientation resulting in additional delay in start of session.    Patient is accompained by:  --   ex husband   Pertinent History  Patient is a pleasant 59 year old female who presents for gait training/ stability for L BKA. Recently obtained her prosthesis last Friday 08/06/19 and has not worn it since the prosthetists office. PMH includes anemia, anxiety,  arthritis, back pain, bradycardia, CHF, CKD stage II, DM, diabetic nephropathy, dyspnea, GERD, HLD, hyperparathyroidism, hypotension, legally blind in left eye, lymphedema, obesity, onychomycosis. Left BKA performed 05/05/19, noted in history that patient had a fall after procedure injuring the anterior portion of the suture line. Currently using a manual chair to get around the home. At this time she can't get into bathroom due to wheelchair, is waiting for a handicap apartment.    Limitations  Lifting;Standing;Walking;House hold activities    How long can you sit comfortably?  n/a    How long can you stand comfortably?  only stood once with prosthetist    How long can you walk comfortably?  not able to walk yet.    Patient Stated Goals  to walk again with prosthesis    Currently in Pain?  Yes    Pain Score  3      Pain Location  Knee    Pain Orientation  Right    Pain Descriptors / Indicators  Aching    Pain Onset  In the past 7 days    Pain Frequency  Constant    Aggravating Factors   weightbearing,    Pain Relieving Factors  rest           Vitals at start of session:  145/83 pulse 79   Goals:    Stand 5 minutes : stand 5 minutes with walker, pain in back but able to stand that long.  With SPC 1 min 43 seconds  5x STS: 13.99 seconds with hands on knees BLE strength Ambulate 50 ft with quad cane    New goal: 6 minute walk test: walked 3 minute 49 seconds for 200 ft with one seated rest break with RW.      Patient's condition has the potential to improve in response to therapy. Maximum improvement is yet to be obtained. The anticipated improvement is attainable and reasonable in a generally predictable time.  Patient reports she wants to walk further than she is able to now but does feel she is improving with her balance and strength as well as her ability to walk short durations.    Patient arrived late to session limiting session duration due to difficulty with parking. Upon arriving at session requests water, forgot multiple things, and requires cueing for task orientation resulting in additional delay in start of session.    Patient is progressing towards goals with her meeting her standing goal. Introduction of 6 minute walk test goal performed with patient tolerating 200 ft with one seated rest break however was unable to make the whole 6 minutes. Patient educated on need for compliance of attendance to therapy sessions for progress. Due to frequency of last minute cancellations and limited compliance patient will be decreased to one time a week. Patient verbalizes understanding. Patient's condition has the potential to improve in response to therapy. Maximum improvement is yet to be obtained. The anticipated improvement is attainable and reasonable in a generally predictable time.   Patient would benefit from skilled PT intervention to increase strength, stability, and gait mechanics in order to improve quality of life and decrease fall risk      PT Education - 01/26/20 1257    Education Details  compliance, goals, POC    Person(s) Educated  Patient    Methods  Explanation    Comprehension  Verbalized understanding       PT Short Term Goals - 01/26/20 1259      PT SHORT TERM  GOAL #1   Title  Patient will be independent in home exercise program to improve strength/mobility for better functional independence with ADLs.    Baseline  10/7: HEP given; 11/23: HEP compliant 1/25: HEP compliant, progressing with increasing challenge of HEP 3/24: occasionally compliant with HEP    Time  2    Period  Weeks    Status  Partially Met    Target Date  02/09/20        PT Long Term Goals - 01/26/20 1300      PT LONG TERM GOAL #1   Title  Patient will tolerate static standing for > 5 minutes for standing ADL performance with weight acceptance onto prosthetic limb to improve quality of life and mobility.    Baseline  10/7: 1 minute 39 seconds with heavy UE support on RW 11/23: 1 minute 50 seconds static stand terminated due to back pain not knees. , 3 minutes of walking/dynamic standing. 11/29/19: 3 min 28 second due to back pain 3/24: stand 5 minutes with walker, pain in back but able to stand that long.    Time  8    Period  Weeks    Status  Achieved      PT LONG TERM GOAL #2   Title  Patient will ambulate >100 feet with RW, mod I with right prosthetic exhibiting reciprocal gait pattern with good  management of prosthetic and good safety awareness for improved home ambulation    Baseline  10/7: unable to ambulate 11/23: 86 ft with RW 11/29/19: able to ambulate 100 ft with RW    Time  8    Period  Weeks    Status  Achieved      PT LONG TERM GOAL #3   Title  Patient (< 42 years old) will complete five times sit to stand test in < 10 seconds indicating an increased LE  strength and improved balance.    Baseline  10/7: 21 seconds with heavy BUE support from raised surface 11/23: 16 seconds with BUE support and RW 1/25; 14 seconds with one hand on chair one hand on RW 3/24: 13.9 seconds with hands on knees    Time  8    Period  Weeks    Status  Partially Met    Target Date  03/22/20      PT LONG TERM GOAL #4   Title  Patient will increase BLE gross strength to 4+/5 as to improve functional strength for independent gait, increased standing tolerance and increased ADL ability.    Baseline  10/7: L 3+/5 R 4-/5 11/23: L 4-/5 R 4/5 with knee 4-/5 due to pain 1/25: L 4/5 gross with 3/5 IR R 4+/5 gross with 4/5 ext/flex 3+/IR 3/24: grossly 4/5 with hip extension 3+/5    Time  8    Period  Weeks    Status  Partially Met    Target Date  03/22/20      PT LONG TERM GOAL #5   Title  Patient will ambulate 50 ft with a quad cane for with CGA or less assistance for ambulating around house and safe performance of ADLs/iADLs.    Baseline  1/25: requires use of quad cane in // bars; unable to ambulate outside of // bars 3/24: forgot quad cane this session    Time  8    Period  Weeks    Status  On-going    Target Date  03/22/20      Additional Long  Term Goals   Additional Long Term Goals  Yes      PT LONG TERM GOAL #6   Title  Patient will increase six minute walk test distance to >500 for progression towards community ambulator and improve gait ability    Baseline  3/24: 200 ft, unable to tolerate 6 minutes    Time  8    Period  Weeks    Status  New    Target Date  03/22/20            Plan - 01/26/20 1259    Clinical Impression Statement  Patient is progressing towards goals with her meeting her standing goal. Introduction of 6 minute walk test goal performed with patient tolerating 200 ft with one seated rest break however was unable to make the whole 6 minutes. Patient educated on need for compliance of attendance to therapy sessions for progress. Due to  frequency of last minute cancellations and limited compliance patient will be decreased to one time a week. Patient verbalizes understanding. Patient's condition has the potential to improve in response to therapy. Maximum improvement is yet to be obtained. The anticipated improvement is attainable and reasonable in a generally predictable time.  Patient would benefit from skilled PT intervention to increase strength, stability, and gait mechanics in order to improve quality of life and decrease fall risk    Personal Factors and Comorbidities  Age;Comorbidity 3+;Education;Finances;Fitness;Past/Current Experience;Social Background;Time since onset of injury/illness/exacerbation;Other    Comorbidities  anemia, anxiety, arthritis, back pain, bradycardia, CHF, CKD stage III, DM, diabetic nephropathy, dyspnea, GERD, heart murmer, HLD, hyperparathyroidism, HTN, blind in L eye, obesity, oncychomycosis    Examination-Activity Limitations  Bathing;Bed Mobility;Caring for Others;Bend;Carry;Dressing;Continence;Hygiene/Grooming;Stairs;Squat;Reach Overhead;Locomotion Level;Lift;Stand;Toileting;Transfers    Examination-Participation Restrictions  Church;Cleaning;Community Activity;Interpersonal Relationship;Driving;Laundry;Volunteer;Shop;Personal Finances;Meal Prep;Yard Work;Other    Stability/Clinical Decision Making  Evolving/Moderate complexity    Rehab Potential  Fair    PT Frequency  1x / week    PT Duration  8 weeks    PT Treatment/Interventions  ADLs/Self Care Home Management;Aquatic Therapy;Biofeedback;Cryotherapy;Electrical Stimulation;Iontophoresis 73m/ml Dexamethasone;Moist Heat;Traction;Ultrasound;Therapeutic activities;Functional mobility training;Stair training;Gait training;DME Instruction;Therapeutic exercise;Balance training;Neuromuscular re-education;Patient/family education;Manual techniques;Wheelchair mTax adviserCompression bandaging;Scar mobilization;Passive range of  motion;Dry needling;Energy conservation;Splinting;Taping    PT Next Visit Plan  recert    PT Home Exercise Plan  prone 10 minutes a day, wear prosthesis 1 hour a day, side lie posterior kick backs 10x    Consulted and Agree with Plan of Care  Patient       Patient will benefit from skilled therapeutic intervention in order to improve the following deficits and impairments:  Abnormal gait, Cardiopulmonary status limiting activity, Decreased activity tolerance, Decreased balance, Decreased knowledge of precautions, Decreased endurance, Decreased coordination, Decreased knowledge of use of DME, Decreased mobility, Decreased range of motion, Decreased safety awareness, Difficulty walking, Decreased strength, Hypomobility, Impaired flexibility, Impaired perceived functional ability, Impaired sensation, Obesity, Prosthetic Dependency, Postural dysfunction, Improper body mechanics, Pain  Visit Diagnosis: Other abnormalities of gait and mobility  Muscle weakness (generalized)  Unsteadiness on feet     Problem List Patient Active Problem List   Diagnosis Date Noted  . Acidosis 09/20/2019  . Benign hypertensive kidney disease with chronic kidney disease 09/20/2019  . Proteinuria 09/20/2019  . Secondary hyperparathyroidism of renal origin ([place] 09/20/2019  . Cataract 09/02/2019  . Chronic pain syndrome 07/13/2019  . Wound dehiscence 05/29/2019  . Hx of BKA, left ([place] 05/24/2019  . PVD (peripheral vascular disease) ([place] 05/13/2019  . Chronic osteomyelitis of ankle and foot, left ([place]  05/05/2019  . Chronic diastolic CHF (congestive heart failure) ([place]) 04/19/2019  . Leg edema 04/19/2019  . Acute bacterial endocarditis 04/19/2019  . Chest pain of uncertain etiology 91/79/1505  . Ulcer of left ankle ([place]) 04/14/2019  . Ankle deformity, left 01/21/2019  . Diabetic polyneuropathy associated with type 2 diabetes mellitus ([place]) 01/21/2019  . Aortic valve endocarditis   . Bacteremia due to  Streptococcus 12/24/2018  . Diabetic ulcer of left ankle associated with diabetes mellitus due to underlying condition, with fat layer exposed ([place]) 11/09/2018  . Malignant hypertension (arteriolar nephrosclerosis), stage 1-4 or unspecified chronic kidney disease 07/15/2018  . Stage 4 chronic kidney disease ([place]) 07/15/2018  . Morbid obesity with BMI of 45.0-49.9, adult ([place]) 07/15/2018  . Excessive daytime sleepiness 07/15/2018  . Other fatigue 03/11/2018  . Essential hypertension 03/11/2018  . Vitamin D deficiency 03/11/2018  . Congestive heart failure ([place]) 03/11/2018  . Primary osteoarthritis of right knee 01/06/2018  . Uncontrolled type 2 diabetes mellitus with polyneuropathy ([place]) 10/03/2017  . Chronic pain of right knee 10/02/2017  . Obesity 10/02/2017  . Charcot's joint of left foot 08/29/2017  . Lymphedema 09/06/2016  . Hyponatremia with extracellular fluid depletion 02/02/2016  . Nausea with vomiting, unspecified 02/02/2016  . Closed nondisplaced fracture of right patella 08/23/2015  . Acute cystitis without hematuria 08/14/2014  . Colitis 08/14/2014  . HTN (hypertension), malignant 08/14/2014  . Acute renal failure superimposed on stage 3 chronic kidney disease ([place]) 06/26/2014  . Hyperparathyroidism, unspecified ([place]) 02/24/2014  . Allergic rhinitis 02/22/2014  . Onychomycosis 02/22/2014  . Anxiety 11/05/2013  . Bradycardia 05/14/2013  . Gastro-esophageal reflux disease without esophagitis 04/05/2013  . Anemia in other chronic diseases classified elsewhere 01/27/2013   Janna Arch, PT, DPT   01/26/2020, 2:47 PM  [place] [facility]IN [facility] SERVICES [address] [place], [place], [address] Phone: [phone]   Fax:  [phone]  Name: Rachel Holmes MRN: [id number] Date of Birth: 11/13/1960

## 2020-01-27 ENCOUNTER — Other Ambulatory Visit: Payer: Self-pay

## 2020-01-27 ENCOUNTER — Telehealth: Payer: Self-pay | Admitting: Endocrinology

## 2020-01-27 ENCOUNTER — Ambulatory Visit (INDEPENDENT_AMBULATORY_CARE_PROVIDER_SITE_OTHER): Payer: Medicare Other | Admitting: Endocrinology

## 2020-01-27 ENCOUNTER — Ambulatory Visit: Payer: Medicare Other

## 2020-01-27 ENCOUNTER — Encounter: Payer: Self-pay | Admitting: Endocrinology

## 2020-01-27 VITALS — BP 140/82 | HR 87 | Ht 68.0 in | Wt 312.6 lb

## 2020-01-27 DIAGNOSIS — E1142 Type 2 diabetes mellitus with diabetic polyneuropathy: Secondary | ICD-10-CM

## 2020-01-27 DIAGNOSIS — IMO0002 Reserved for concepts with insufficient information to code with codable children: Secondary | ICD-10-CM

## 2020-01-27 DIAGNOSIS — E1165 Type 2 diabetes mellitus with hyperglycemia: Secondary | ICD-10-CM | POA: Diagnosis not present

## 2020-01-27 LAB — POCT GLYCOSYLATED HEMOGLOBIN (HGB A1C): Hemoglobin A1C: 7.6 % — AB (ref 4.0–5.6)

## 2020-01-27 MED ORDER — FREESTYLE LIBRE 14 DAY SENSOR MISC: 1.0000 | 3 refills | Status: DC

## 2020-01-27 MED ORDER — FREESTYLE LIBRE 14 DAY READER DEVI: 1.0000 | 0 refills | Status: DC

## 2020-01-27 MED ORDER — INSULIN LISPRO (1 UNIT DIAL) 100 UNIT/ML (KWIKPEN): PEN_INJECTOR | SUBCUTANEOUS | 3 refills | Status: DC

## 2020-01-27 NOTE — Patient Instructions (Addendum)
please change the insulin to 3 times a day (just before each meal) 15-17-15 units. check your blood sugar 4 times a day.  vary the time of day when you check, between before the 3 meals, and at bedtime.  also check if you have symptoms of your blood sugar being too high or too low.  please keep a record of the readings and bring it to your next appointment here (or you can bring the meter itself).  You can write it on any piece of paper.  please call us sooner if your blood sugar goes below 70, or if you have a lot of readings over 200. Please come back for a follow-up appointment in 2 months.

## 2020-01-27 NOTE — Addendum Note (Signed)
Addended by: Judene Companion on: 01/27/2020 09:07 AM   Modules accepted: Orders

## 2020-01-27 NOTE — Telephone Encounter (Signed)
Outpatient Medication Detail   Disp Refills Start End   Continuous Blood Gluc Receiver (FREESTYLE LIBRE 14 DAY READER) DEVI 1 each 0 01/27/2020    Sig - Route: 1 each by Does not apply route See admin instructions. For continuous glucose monitoring; E11.9 - Does not apply   Sent to pharmacy as: Continuous Blood Gluc Receiver (FREESTYLE LIBRE 14 DAY READER) Device   E-Prescribing Status: Receipt confirmed by pharmacy (01/27/2020 11:11 AM EDT)    Outpatient Medication Detail   Disp Refills Start End   Continuous Blood Gluc Sensor (FREESTYLE LIBRE 14 DAY SENSOR) MISC 6 each 3 01/27/2020    Sig - Route: 1 Device by Does not apply route every 14 (fourteen) days. For use with continuous glucose monitoring system; E11.9 - Does not apply   Sent to pharmacy as: Continuous Blood Gluc Sensor (FREESTYLE LIBRE [place]) Misc   E-Prescribing Status: Receipt confirmed by pharmacy (01/27/2020 11:11 AM EDT)    Pharmacy  [place], [place]  [place], [place] [address]  Phone:  [phone]  Fax:  [phone]

## 2020-01-27 NOTE — Progress Notes (Signed)
Subjective:    Patient ID: Rachel Holmes, female    DOB: [birth year], 59 y.o.   MRN: [id number]  HPI Pt returns for f/u of diabetes mellitus: DM type: Insulin-requiring type 2 Dx'ed: 1988, during a pregnancy, but it persisted after Complications: polyneuropathy, renal failure, leg ulcer, left BKA, PAD, and PDR.   Therapy: insulin since 7829, and trulicity DKA: never Severe hypoglycemia: 2 episodes (both many years ago).   Pancreatitis: never Pancreatic imaging: normal on 2008 [name].   Other: a trial off insulin in early 2020 was unsuccessful; renal failure limits rx options; she takes multiple daily injections; she does not need basal insulin.  Interval history: no recent steroids.  pt states cbg's vary from 50-230.  It is in general highest at lunch, and lowest at HS.  She says she never misses the insulin.  Past Medical History:  Diagnosis Date  . Allergic rhinitis   . Allergy   . Anemia   . Anxiety   . Arthritis   . Back pain   . Bradycardia   . Breast mass    Patient can no longer palpate specific masses but showed tech general area of concern  . CHF (congestive heart failure) ([place])   . CKD (chronic kidney disease)    STAGE 3  . Constipation   . Diabetes mellitus without complication ([place])   . Diabetic nephropathy ([place])   . Dyspnea   . GERD (gastroesophageal reflux disease)   . Heart murmur   . HLD (hyperlipidemia)   . Hyperparathyroidism ([place])   . Hypertension   . Joint pain   . Leg edema   . Legally blind in left eye, as defined in [place]   . Lymphedema   . Obesity   . Onychomycosis     Past Surgical History:  Procedure Laterality Date  . ABDOMINAL HYSTERECTOMY    . AMPUTATION Left 05/05/2019   Procedure: AMPUTATION BELOW KNEE;  Surgeon: Katha Cabal, MD;  Location: [facility] ORS;  Service: Vascular;  Laterality: Left;  . BREAST BIOPSY Left 2014   FNA 12:00 position - Negative  . EYE SURGERY Left 2007   removed a lens, no lens implanted  . IR FLUORO GUIDE CV  LINE RIGHT  12/29/2018  . IR REMOVAL TUN CV CATH W/O FL  02/19/2019  . IR US GUIDE VASC ACCESS RIGHT  12/29/2018  . TEE WITHOUT CARDIOVERSION N/A 12/28/2018   Procedure: TRANSESOPHAGEAL ECHOCARDIOGRAM (TEE);  Surgeon: Sueanne Margarita, MD;  Location: [facility] ENDOSCOPY;  Service: Cardiovascular;  Laterality: N/A;  . VAGINAL HYSTERECTOMY     abdominal hyst, not a vaginal hyst    Social History   Socioeconomic History  . Marital status: Divorced    Spouse name: Not on file  . Number of children: 1  . Years of education: Not on file  . Highest education level: Not on file  Occupational History  . Occupation: Glass blower/designer    Comment: disability  Tobacco Use  . Smoking status: Never Smoker  . Smokeless tobacco: Never Used  Substance and Sexual Activity  . Alcohol use: No  . Drug use: Never  . Sexual activity: Not on file  Other Topics Concern  . Not on file  Social History Narrative   ** Merged History Encounter **    on disability   Social Determinants of Health   Financial Resource Strain:   . Difficulty of Paying Living Expenses:   Food Insecurity:   . Worried About Charity fundraiser  in the Last Year:   . [place] in the Last Year:   Transportation Needs:   . Film/video editor (Medical):   [name] Lack of Transportation (Non-Medical):   Physical Activity:   . Days of Exercise per Week:   . Minutes of Exercise per Session:   Stress:   . Feeling of Stress :   Social Connections:   . Frequency of Communication with Friends and Family:   . Frequency of Social Gatherings with Friends and Family:   . Attends Religious Services:   . Active Member of Clubs or Organizations:   . Attends Archivist Meetings:   [name] Marital Status:   Intimate Partner Violence:   . Fear of Current or Ex-Partner:   . Emotionally Abused:   [name] Physically Abused:   . Sexually Abused:     Current Outpatient Medications on File Prior to Visit  Medication Sig Dispense Refill  .  albuterol (PROVENTIL HFA;VENTOLIN HFA) 108 (90 Base) MCG/ACT inhaler Inhale 1-2 puffs into the lungs every 6 (six) hours as needed for wheezing or shortness of breath.    [name] aspirin EC 81 MG EC tablet Take 1 tablet (81 mg total) by mouth daily.    . Blood Glucose Monitoring Suppl (ONE TOUCH ULTRA 2) w/Device KIT 1 Device by Does not apply route daily. (Patient taking differently: 1 Device by Does not apply route 3 (three) times daily. ) 1 each 0  . clobetasol cream (TEMOVATE) 6.62 % Apply 1 application topically 2 (two) times daily.    . cloNIDine (CATAPRES) 0.3 MG tablet Take 0.3 mg by mouth 2 (two) times daily.    [name] docusate sodium (COLACE) 100 MG capsule Take 1 capsule (100 mg total) by mouth 2 (two) times daily. 10 capsule 0  . Dulaglutide (TRULICITY) 3 HU/7.6LY SOPN Inject 3 mg into the skin once a week. 12 pen 3  . DULoxetine (CYMBALTA) 30 MG capsule Take 30 mg by mouth daily.     . Ensure Max Protein (ENSURE MAX PROTEIN) LIQD Take 330 mLs (11 oz total) by mouth 2 (two) times daily.    . fluticasone (FLONASE) 50 MCG/ACT nasal spray Place 1 spray into both nostrils daily.     [name] gabapentin (NEURONTIN) 600 MG tablet Take 600 mg by mouth at bedtime.    [name] glucosamine-chondroitin 500-400 MG tablet Take 1 tablet by mouth 2 (two) times daily.    . hydrALAZINE (APRESOLINE) 50 MG tablet Take 1 tablet (50 mg total) by mouth 3 (three) times daily. 270 tablet 3  . Insulin Pen Needle (BD PEN NEEDLE MICRO U/F) 32G X 6 MM MISC 1 each by Does not apply route 3 (three) times daily. 100 each 3  . isosorbide mononitrate (IMDUR) 120 MG 24 hr tablet Take 1 tablet (120 mg total) by mouth daily. 180 tablet 3  . Lancets (ONETOUCH ULTRASOFT) lancets Used to check blood sugars four times daily. (Patient taking differently: 1 each by Other route 3 (three) times daily. ) 200 each 12  . Multiple Vitamin (MULTIVITAMIN WITH MINERALS) TABS tablet Take 1 tablet by mouth daily.    [name] ofloxacin (OCUFLOX) 0.3 % ophthalmic solution  Place 1 drop into both eyes in the morning, at noon, in the evening, and at bedtime.    [name] olmesartan (BENICAR) 20 MG tablet Take 40 mg by mouth daily. Pt is taking 24m daily    . omeprazole (PRILOSEC) 40 MG capsule Take 40 mg by mouth 2 (two) times  daily.    . ondansetron (ZOFRAN) 4 MG tablet Take 4 mg by mouth every 8 (eight) hours as needed for nausea or vomiting.    Glory Rosebush VERIO test strip USE TO CHECK BLOOD SUGAR TWICE DAILY (Patient taking differently: 1 each by Other route 3 (three) times daily. ) 100 each 0  . prednisoLONE acetate (PRED FORTE) 1 % ophthalmic suspension Place 1 drop into both eyes in the morning, at noon, in the evening, and at bedtime.    . torsemide (DEMADEX) 20 MG tablet Take 1 tablet (20 mg total) by mouth 2 (two) times daily. Take an extra 68m tablet daily prn in the morning for swelling or sob.    . traMADol (ULTRAM) 50 MG tablet One to Two Tabs Every Six Hours As Needed For Pain 40 tablet 0  . vitamin B-12 (CYANOCOBALAMIN) 1000 MCG tablet Take 1,000 mcg by mouth daily.     No current facility-administered medications on file prior to visit.    Allergies  Allergen Reactions  . Statins Shortness Of Breath and Other (See Comments)    Wheezing, short of breath  . Penicillins Hives and Other (See Comments)    Has patient had a PCN reaction causing immediate rash, facial/tongue/throat swelling, SOB or lightheadedness with hypotension: No Has patient had a PCN reaction causing severe rash involving mucus membranes or skin necrosis: No Has patient had a PCN reaction that required hospitalization No Has patient had a PCN reaction occurring within the last 10 years: No If all of the above answers are "NO", then may proceed with Cephalosporin use.  . Codeine Nausea Only  . Ibuprofen Other (See Comments)    Reaction:  Raises pts BP  . Percocet [Oxycodone-Acetaminophen] Nausea Only  . Tramadol Nausea Only and Other (See Comments)    Can take if she has  eaten. Currently takes with good results   . Vicodin [Hydrocodone-Acetaminophen] Itching and Nausea Only    Family History  Problem Relation Age of Onset  . Breast cancer Sister 550 . Diabetes Sister   . Diabetes Mother   . Hypertension Mother   . Hyperlipidemia Mother   . Eating disorder Mother   . Obesity Mother     BP 140/82   Pulse 87   Ht 5' 8"  (1.727 m)   Wt (!) 312 lb 9.6 oz (141.8 kg)   SpO2 94%   BMI 47.53 kg/m    Review of Systems Denies LOC    Objective:   Physical Exam VITAL SIGNS:  See vs page GENERAL: no distress Pulses: right dorsalis pedis is intact. MSK: no deformity of the right foot CV: trace right leg edema.   Skin:  no ulcer on the right foot.  normal color and temp on the right foot.   Neuro: sensation is intact to touch on the right foot.   Ext: there is onychomycosis of the right foot toenails.  Left BKA.    Lab Results  Component Value Date   HGBA1C 7.6 (A) 01/27/2020   Lab Results  Component Value Date   CREATININE 3.25 (H) 05/28/2019   BUN 66 (H) 05/28/2019   NA 136 05/28/2019   K 4.3 05/28/2019   CL 103 05/28/2019   CO2 20 (L) 05/28/2019        Assessment & Plan:  Insulin-requiring type 2 DM, with PAD: Based on the pattern of her cbg's, she needs some adjustment in her therapy Hypoglycemia: this limits aggressiveness of glycemic control. Renal failure: in this  setting, she does not need basal insulin.   Patient Instructions  please change the insulin to 3 times a day (just before each meal) 15-17-15 units. check your blood sugar 4 times a day.  vary the time of day when you check, between before the 3 meals, and at bedtime.  also check if you have symptoms of your blood sugar being too high or too low.  please keep a record of the readings and bring it to your next appointment here (or you can bring the meter itself).  You can write it on any piece of paper.  please call us sooner if your blood sugar goes below 70, or if you  have a lot of readings over 200. Please come back for a follow-up appointment in 2 months.

## 2020-01-27 NOTE — Telephone Encounter (Signed)
Patient called to let Dr. Loanne Drilling know that the name of the Company for patient to get the [place] [place] is [employer]. Patient gave no additional information.

## 2020-01-31 ENCOUNTER — Ambulatory Visit: Payer: Medicare Other

## 2020-01-31 ENCOUNTER — Telehealth (INDEPENDENT_AMBULATORY_CARE_PROVIDER_SITE_OTHER): Payer: Self-pay

## 2020-01-31 ENCOUNTER — Telehealth: Payer: Self-pay | Admitting: *Deleted

## 2020-01-31 NOTE — Telephone Encounter (Signed)
I have written one out for her.  She can come pick it up at her convenience

## 2020-01-31 NOTE — Telephone Encounter (Signed)
The pt called an left a Voice mail on the nurse's line saying she was seen about her prothesis and needs to come in to get a RX for another prothesis due to it needing to be refitted cause her stump has shrunk.

## 2020-01-31 NOTE — Telephone Encounter (Signed)
I spoke with the pt and  Her made aware that she can pick up her Rx for Bio tech at her convince the pt said that she would pick up the Rx on Wednesday from the office.

## 2020-01-31 NOTE — Telephone Encounter (Signed)
I called an left a Voicemail on the pt's phone making her aware that she can come an pick up there Rx at her convince.

## 2020-02-02 ENCOUNTER — Ambulatory Visit: Payer: Medicare Other

## 2020-02-02 ENCOUNTER — Telehealth: Payer: Self-pay | Admitting: *Deleted

## 2020-02-02 ENCOUNTER — Other Ambulatory Visit: Payer: Self-pay

## 2020-02-02 ENCOUNTER — Other Ambulatory Visit: Payer: Self-pay | Admitting: Endocrinology

## 2020-02-02 ENCOUNTER — Telehealth: Payer: Self-pay | Admitting: Cardiovascular Disease

## 2020-02-02 DIAGNOSIS — R2681 Unsteadiness on feet: Secondary | ICD-10-CM

## 2020-02-02 DIAGNOSIS — E1142 Type 2 diabetes mellitus with diabetic polyneuropathy: Secondary | ICD-10-CM

## 2020-02-02 DIAGNOSIS — M6281 Muscle weakness (generalized): Secondary | ICD-10-CM

## 2020-02-02 DIAGNOSIS — R2689 Other abnormalities of gait and mobility: Secondary | ICD-10-CM | POA: Diagnosis not present

## 2020-02-02 DIAGNOSIS — IMO0002 Reserved for concepts with insufficient information to code with codable children: Secondary | ICD-10-CM

## 2020-02-02 DIAGNOSIS — E1165 Type 2 diabetes mellitus with hyperglycemia: Secondary | ICD-10-CM

## 2020-02-02 NOTE — Telephone Encounter (Signed)
Spoke with patient and she reports that when she started the hydralazine she developed itching and so she went back on her amlodipine. She said that provider told her that if it didn't work to switch back. Let her know that I would make provider aware of her switch and to please call us back if any further problems. She verbalized understanding with no further questions at this time.

## 2020-02-02 NOTE — Telephone Encounter (Signed)
Pre procedure instructions given

## 2020-02-02 NOTE — Telephone Encounter (Signed)
Patient states she had to go back on her Amlodipine because she could not take Hydralazine. States it made her itch. Pt c/o medication issue:  1. Name of Medication: Hydralazine  2. How are you currently taking this medication (dosage and times per day)? 50 mg 3 times a day  3. Are you having a reaction (difficulty breathing--STAT)? itching  4. What is your medication issue?  Please call to discuss.

## 2020-02-02 NOTE — Therapy (Signed)
[place] MAIN [facility] SERVICES [address] [place], [place], [address] Phone: [phone]   Fax:  [phone]  Physical Therapy Treatment  Patient Details  Name: Rachel Holmes MRN: [id number] Date of Birth: [birth year] Referring Provider (PT): Eulogio Ditch    Encounter Date: 02/02/2020  PT End of Session - 02/02/20 1221    Visit Number  31    Number of Visits  38    Date for PT Re-Evaluation  03/22/20    Authorization Type  1/10 PN on 01/26/20    PT Start Time  1100    PT Stop Time  1144    PT Time Calculation (min)  44 min    Equipment Utilized During Treatment  Gait belt    Activity Tolerance  Patient tolerated treatment well;Patient limited by fatigue    Behavior During Therapy  [facility] for tasks assessed/performed       Past Medical History:  Diagnosis Date  . Allergic rhinitis   . Allergy   . Anemia   . Anxiety   . Arthritis   . Back pain   . Bradycardia   . Breast mass    Patient can no longer palpate specific masses but showed tech general area of concern  . CHF (congestive heart failure) ([place])   . CKD (chronic kidney disease)    STAGE 3  . Constipation   . Diabetes mellitus without complication ([place])   . Diabetic nephropathy ([place])   . Dyspnea   . GERD (gastroesophageal reflux disease)   . Heart murmur   . HLD (hyperlipidemia)   . Hyperparathyroidism ([place])   . Hypertension   . Joint pain   . Leg edema   . Legally blind in left eye, as defined in [place]   . Lymphedema   . Obesity   . Onychomycosis     Past Surgical History:  Procedure Laterality Date  . ABDOMINAL HYSTERECTOMY    . AMPUTATION Left 05/05/2019   Procedure: AMPUTATION BELOW KNEE;  Surgeon: Katha Cabal, MD;  Location: [facility] ORS;  Service: Vascular;  Laterality: Left;  . BREAST BIOPSY Left 2014   FNA 12:00 position - Negative  . EYE SURGERY Left 2007   removed a lens, no lens implanted  . IR FLUORO GUIDE CV LINE RIGHT  12/29/2018  . IR REMOVAL  TUN CV CATH W/O FL  02/19/2019  . IR US GUIDE VASC ACCESS RIGHT  12/29/2018  . TEE WITHOUT CARDIOVERSION N/A 12/28/2018   Procedure: TRANSESOPHAGEAL ECHOCARDIOGRAM (TEE);  Surgeon: Sueanne Margarita, MD;  Location: [facility] ENDOSCOPY;  Service: Cardiovascular;  Laterality: N/A;  . VAGINAL HYSTERECTOMY     abdominal hyst, not a vaginal hyst    There were no vitals filed for this visit.  Subjective Assessment - 02/02/20 1125    Subjective  Patient is getting her nerve block on Monday and has a new script for her prosthetic due to improper fit. Wants to return to work.    Patient is accompained by:  --   ex husband   Pertinent History  Patient is a pleasant 60 year old female who presents for gait training/ stability for L BKA. Recently obtained her prosthesis last Friday 08/06/19 and has not worn it since the prosthetists office. PMH includes anemia, anxiety, arthritis, back pain, bradycardia, CHF, CKD stage II, DM, diabetic nephropathy, dyspnea, GERD, HLD, hyperparathyroidism, hypotension, legally blind in left eye, lymphedema, obesity, onychomycosis. Left BKA performed 05/05/19, noted in history that patient had a  fall after procedure injuring the anterior portion of the suture line. Currently using a manual chair to get around the home. At this time she can't get into bathroom due to wheelchair, is waiting for a handicap apartment.    Limitations  Lifting;Standing;Walking;House hold activities    How long can you sit comfortably?  n/a    How long can you stand comfortably?  only stood once with prosthetist    How long can you walk comfortably?  not able to walk yet.    Patient Stated Goals  to walk again with prosthesis    Currently in Pain?  Yes    Pain Score  4     Pain Location  Knee    Pain Orientation  Right    Pain Descriptors / Indicators  Aching    Pain Type  Chronic pain    Pain Onset  In the past 7 days    Pain Frequency  Constant         Patient is getting her nerve block on Monday and  has a new script for her prosthetic due to improper fit.   Vitals at start of session: 149/63 pulse 66   In // bars:  Side stepping 4x length of // bars; cues for keeping hands for stability not for assistance   Backwards ambulation with BUE support ; cues for increasing hip extension with upright posture   6" step toe taps occasional UE support 12x each LE.  Ambulate with SUE support; cues for hand placement, step "to it and through it" with improved gait mechanics with repetition 4x length of // bars x 2 trials.   High knee marches 2x length of // bars, BUE support  ambulate 96 ft with RW and close CGA ; cueing for upright posture, improved gait mechanics and weight shift with good velocity changes with negotiation of obstacles.   Seated: Hamstring stretch on 6" step 30 seconds ; education on medium hold of stretch rather than "big stretch"    RTB around ankles: ER/IR 12x each LE   dynadisxc RLE df/pf 15x    Pt educated throughout session about proper posture and technique with exercises. Improved exercise technique, movement at target joints, use of target muscles after min to mod verbal, visual, tactile cues.                   PT Education - 02/02/20 1130    Education Details  exercise technique, body mechanics    Person(s) Educated  Patient    Methods  Explanation;Demonstration;Tactile cues;Verbal cues    Comprehension  Verbalized understanding;Returned demonstration;Verbal cues required;Tactile cues required       PT Short Term Goals - 01/26/20 1259      PT SHORT TERM GOAL #1   Title  Patient will be independent in home exercise program to improve strength/mobility for better functional independence with ADLs.    Baseline  10/7: HEP given; 11/23: HEP compliant 1/25: HEP compliant, progressing with increasing challenge of HEP 3/24: occasionally compliant with HEP    Time  2    Period  Weeks    Status  Partially Met    Target Date  02/09/20         PT Long Term Goals - 01/26/20 1300      PT LONG TERM GOAL #1   Title  Patient will tolerate static standing for > 5 minutes for standing ADL performance with weight acceptance onto prosthetic limb to improve quality of life  and mobility.    Baseline  10/7: 1 minute 39 seconds with heavy UE support on RW 11/23: 1 minute 50 seconds static stand terminated due to back pain not knees. , 3 minutes of walking/dynamic standing. 11/29/19: 3 min 28 second due to back pain 3/24: stand 5 minutes with walker, pain in back but able to stand that long.    Time  8    Period  Weeks    Status  Achieved      PT LONG TERM GOAL #2   Title  Patient will ambulate >100 feet with RW, mod I with right prosthetic exhibiting reciprocal gait pattern with good  management of prosthetic and good safety awareness for improved home ambulation    Baseline  10/7: unable to ambulate 11/23: 86 ft with RW 11/29/19: able to ambulate 100 ft with RW    Time  8    Period  Weeks    Status  Achieved      PT LONG TERM GOAL #3   Title  Patient (< 60 years old) will complete five times sit to stand test in < 10 seconds indicating an increased LE strength and improved balance.    Baseline  10/7: 21 seconds with heavy BUE support from raised surface 11/23: 16 seconds with BUE support and RW 1/25; 14 seconds with one hand on chair one hand on RW 3/24: 13.9 seconds with hands on knees    Time  8    Period  Weeks    Status  Partially Met    Target Date  03/22/20      PT LONG TERM GOAL #4   Title  Patient will increase BLE gross strength to 4+/5 as to improve functional strength for independent gait, increased standing tolerance and increased ADL ability.    Baseline  10/7: L 3+/5 R 4-/5 11/23: L 4-/5 R 4/5 with knee 4-/5 due to pain 1/25: L 4/5 gross with 3/5 IR R 4+/5 gross with 4/5 ext/flex 3+/IR 3/24: grossly 4/5 with hip extension 3+/5    Time  8    Period  Weeks    Status  Partially Met    Target Date  03/22/20      PT LONG  TERM GOAL #5   Title  Patient will ambulate 50 ft with a quad cane for with CGA or less assistance for ambulating around house and safe performance of ADLs/iADLs.    Baseline  1/25: requires use of quad cane in // bars; unable to ambulate outside of // bars 3/24: forgot quad cane this session    Time  8    Period  Weeks    Status  On-going    Target Date  03/22/20      Additional Long Term Goals   Additional Long Term Goals  Yes      PT LONG TERM GOAL #6   Title  Patient will increase six minute walk test distance to >500 for progression towards community ambulator and improve gait ability    Baseline  3/24: 200 ft, unable to tolerate 6 minutes    Time  8    Period  Weeks    Status  New    Target Date  03/22/20            Plan - 02/02/20 1226    Clinical Impression Statement  Patient reports she is ready to go back to work.  Patient's gait mechanics are improving with decreased episodes of instability and improved  velocity of ambulation. Occasional right knee pain requires seated rest breaks. Patient would benefit from skilled PT intervention to increase strength, stability, and gait mechanics in order to improve quality of life and decrease fall risk    Personal Factors and Comorbidities  Age;Comorbidity 3+;Education;Finances;Fitness;Past/Current Experience;Social Background;Time since onset of injury/illness/exacerbation;Other    Comorbidities  anemia, anxiety, arthritis, back pain, bradycardia, CHF, CKD stage III, DM, diabetic nephropathy, dyspnea, GERD, heart murmer, HLD, hyperparathyroidism, HTN, blind in L eye, obesity, oncychomycosis    Examination-Activity Limitations  Bathing;Bed Mobility;Caring for Others;Bend;Carry;Dressing;Continence;Hygiene/Grooming;Stairs;Squat;Reach Overhead;Locomotion Level;Lift;Stand;Toileting;Transfers    Examination-Participation Restrictions  Church;Cleaning;Community Activity;Interpersonal Relationship;Driving;Laundry;Volunteer;Shop;Personal  Finances;Meal Prep;Yard Work;Other    Stability/Clinical Decision Making  Evolving/Moderate complexity    Rehab Potential  Fair    PT Frequency  1x / week    PT Duration  8 weeks    PT Treatment/Interventions  ADLs/Self Care Home Management;Aquatic Therapy;Biofeedback;Cryotherapy;Electrical Stimulation;Iontophoresis 2m/ml Dexamethasone;Moist Heat;Traction;Ultrasound;Therapeutic activities;Functional mobility training;Stair training;Gait training;DME Instruction;Therapeutic exercise;Balance training;Neuromuscular re-education;Patient/family education;Manual techniques;Wheelchair mTax adviserCompression bandaging;Scar mobilization;Passive range of motion;Dry needling;Energy conservation;Splinting;Taping    PT Next Visit Plan  decrease UE support    PT Home Exercise Plan  prone 10 minutes a day, wear prosthesis 1 hour a day, side lie posterior kick backs 10x    Consulted and Agree with Plan of Care  Patient       Patient will benefit from skilled therapeutic intervention in order to improve the following deficits and impairments:  Abnormal gait, Cardiopulmonary status limiting activity, Decreased activity tolerance, Decreased balance, Decreased knowledge of precautions, Decreased endurance, Decreased coordination, Decreased knowledge of use of DME, Decreased mobility, Decreased range of motion, Decreased safety awareness, Difficulty walking, Decreased strength, Hypomobility, Impaired flexibility, Impaired perceived functional ability, Impaired sensation, Obesity, Prosthetic Dependency, Postural dysfunction, Improper body mechanics, Pain  Visit Diagnosis: Other abnormalities of gait and mobility  Muscle weakness (generalized)  Unsteadiness on feet     Problem List Patient Active Problem List   Diagnosis Date Noted  . Acidosis 09/20/2019  . Benign hypertensive kidney disease with chronic kidney disease 09/20/2019  . Proteinuria 09/20/2019  . Secondary  hyperparathyroidism of renal origin ([place] 09/20/2019  . Cataract 09/02/2019  . Chronic pain syndrome 07/13/2019  . Wound dehiscence 05/29/2019  . Hx of BKA, left ([place] 05/24/2019  . PVD (peripheral vascular disease) ([place] 05/13/2019  . Chronic osteomyelitis of ankle and foot, left ([place] 05/05/2019  . Chronic diastolic CHF (congestive heart failure) ([place] 04/19/2019  . Leg edema 04/19/2019  . Acute bacterial endocarditis 04/19/2019  . Chest pain of uncertain etiology 009/[id number]/3382 . Ulcer of left ankle ([place] 04/14/2019  . Ankle deformity, left 01/21/2019  . Diabetic polyneuropathy associated with type 2 diabetes mellitus ([place] 01/21/2019  . Aortic valve endocarditis   . Bacteremia due to Streptococcus 12/24/2018  . Diabetic ulcer of left ankle associated with diabetes mellitus due to underlying condition, with fat layer exposed ([place] 11/09/2018  . Malignant hypertension (arteriolar nephrosclerosis), stage 1-4 or unspecified chronic kidney disease 07/15/2018  . Stage 4 chronic kidney disease ([place] 07/15/2018  . Morbid obesity with BMI of 45.0-49.9, adult ([place] 07/15/2018  . Excessive daytime sleepiness 07/15/2018  . Other fatigue 03/11/2018  . Essential hypertension 03/11/2018  . Vitamin D deficiency 03/11/2018  . Congestive heart failure ([place] 03/11/2018  . Primary osteoarthritis of right knee 01/06/2018  . Uncontrolled type 2 diabetes mellitus with polyneuropathy ([place] 10/03/2017  . Chronic pain of right knee 10/02/2017  . Obesity 10/02/2017  . Charcot's joint of left foot 08/29/2017  .  Lymphedema 09/06/2016  . Hyponatremia with extracellular fluid depletion 02/02/2016  . Nausea with vomiting, unspecified 02/02/2016  . Closed nondisplaced fracture of right patella 08/23/2015  . Acute cystitis without hematuria 08/14/2014  . Colitis 08/14/2014  . HTN (hypertension), malignant 08/14/2014  . Acute renal failure superimposed on stage 3 chronic kidney disease ([place]) 06/26/2014  .  Hyperparathyroidism, unspecified ([place]) 02/24/2014  . Allergic rhinitis 02/22/2014  . Onychomycosis 02/22/2014  . Anxiety 11/05/2013  . Bradycardia 05/14/2013  . Gastro-esophageal reflux disease without esophagitis 04/05/2013  . Anemia in other chronic diseases classified elsewhere 01/27/2013   Janna Arch, PT, DPT   02/02/2020, 12:27 PM  [place] MAIN [facility] SERVICES [address] [place], [place], [address] Phone: [phone]   Fax:  [phone]  Name: Rachel Holmes MRN: [id number] Date of Birth: [birth year]

## 2020-02-07 ENCOUNTER — Ambulatory Visit
Admission: RE | Admit: 2020-02-07 | Discharge: 2020-02-07 | Disposition: A | Discharge status: Home / Self Care | Payer: Medicare Other | Source: Ambulatory Visit | Attending: Student in an Organized Health Care Education/Training Program | Admitting: Student in an Organized Health Care Education/Training Program

## 2020-02-07 ENCOUNTER — Other Ambulatory Visit: Payer: Self-pay

## 2020-02-07 ENCOUNTER — Ambulatory Visit (HOSPITAL_BASED_OUTPATIENT_CLINIC_OR_DEPARTMENT_OTHER): Payer: Medicare Other | Admitting: Student in an Organized Health Care Education/Training Program

## 2020-02-07 ENCOUNTER — Encounter: Payer: Self-pay | Admitting: Student in an Organized Health Care Education/Training Program

## 2020-02-07 ENCOUNTER — Ambulatory Visit: Payer: Medicare Other

## 2020-02-07 DIAGNOSIS — M1711 Unilateral primary osteoarthritis, right knee: Secondary | ICD-10-CM | POA: Insufficient documentation

## 2020-02-07 MED ORDER — LIDOCAINE HCL 2 % IJ SOLN
20.0000 mL | Freq: Once | INTRAMUSCULAR | Status: AC
  Administered 2020-02-07: 400 mg

## 2020-02-07 MED ORDER — FENTANYL CITRATE (PF) 100 MCG/2ML IJ SOLN
25.0000 ug | INTRAMUSCULAR | Status: DC | PRN
  Administered 2020-02-07: 75 ug via INTRAVENOUS

## 2020-02-07 MED ORDER — DEXAMETHASONE SODIUM PHOSPHATE 10 MG/ML IJ SOLN
INTRAMUSCULAR | Status: AC
  Filled 2020-02-07: qty 1

## 2020-02-07 MED ORDER — ROPIVACAINE HCL 2 MG/ML IJ SOLN
9.0000 mL | Freq: Once | INTRAMUSCULAR | Status: AC
  Administered 2020-02-07: 10 mL via PERINEURAL

## 2020-02-07 MED ORDER — FENTANYL CITRATE (PF) 100 MCG/2ML IJ SOLN
INTRAMUSCULAR | Status: AC
  Filled 2020-02-07: qty 2

## 2020-02-07 MED ORDER — LIDOCAINE HCL 2 % IJ SOLN
INTRAMUSCULAR | Status: AC
  Filled 2020-02-07: qty 20

## 2020-02-07 MED ORDER — DEXAMETHASONE SODIUM PHOSPHATE 10 MG/ML IJ SOLN
10.0000 mg | Freq: Once | INTRAMUSCULAR | Status: AC
  Administered 2020-02-07: 10 mg

## 2020-02-07 MED ORDER — ROPIVACAINE HCL 2 MG/ML IJ SOLN
INTRAMUSCULAR | Status: AC
  Filled 2020-02-07: qty 10

## 2020-02-07 NOTE — Progress Notes (Signed)
PROVIDER NOTE: Information contained herein reflects review and annotations entered in association with encounter. Interpretation of such information and data should be left to medically-trained personnel. Information provided to patient can be located elsewhere in the medical record under "Patient Instructions". Document created using STT-dictation technology, any transcriptional errors that may result from process are unintentional.    Patient: Rachel Holmes  Service Category: Procedure  Provider: Gillis Santa, MD  DOB: Mar 23, 1961  DOS: 02/07/2020  Location: [facility]  MRN: [id number]  Setting: Ambulatory - outpatient  Referring Provider: Gillis Santa, MD  Type: Established Patient  Specialty: Interventional Pain Management  PCP: Sharyne Peach, MD   Primary Reason for Visit: Interventional Pain Management Treatment. CC: Knee Pain (right )  Procedure:          Anesthesia, Analgesia, Anxiolysis:  Type: Therapeutic Superior-lateral, Superior-medial, and Inferior-medial, Genicular Nerve Radiofrequency Ablation.  #1  Region: Lateral, Anterior, and Medial aspects of the knee joint, above and below the knee joint proper. Level: Superior and inferior to the knee joint. Laterality: Right  Type: Moderate (Conscious) Sedation combined with Local Anesthesia Indication(s): Analgesia and Anxiety Route: Intravenous (IV) IV Access: Secured Sedation: Meaningful verbal contact was maintained at all times during the procedure  Local Anesthetic: Lidocaine 1-2%  Position: Supine   Indications: 1. Primary osteoarthritis of right knee    Rachel Holmes has been dealing with the above chronic pain for longer than three months and has either failed to respond, was unable to tolerate, or simply did not get enough benefit from other more conservative therapies including, but not limited to: 1. Over-the-counter medications 2. Anti-inflammatory medications 3. Muscle relaxants 4. Membrane  stabilizers 5. Opioids 6. Physical therapy and/or chiropractic manipulation 7. Modalities (Heat, ice, etc.) 8. Invasive techniques such as nerve blocks. Rachel Holmes has attained more than 50% relief of the pain from a series of diagnostic injections conducted in separate occasions.  Pain Score: Pre-procedure: 7/10 Post-procedure: 4 /10  Pre-op Assessment:  [name] is a 59 y.o. (year old), female patient, seen today for interventional treatment. She  has a past surgical history that includes Abdominal hysterectomy; TEE without cardioversion (N/A, 12/28/2018); IR Fluoro Guide CV Line Right (12/29/2018); IR US Guide Vasc Access Right (12/29/2018); IR Removal Tun Cv Cath W/O FL (02/19/2019); Vaginal hysterectomy; Breast biopsy (Left, 2014); Eye surgery (Left, 2007); and Amputation (Left, 05/05/2019). Rachel Holmes has a current medication list which includes the following prescription(s): albuterol, amlodipine, aspirin, one touch ultra 2, clobetasol cream, clonidine, freestyle libre 14 day reader, freestyle libre 14 day sensor, docusate sodium, trulicity, duloxetine, ensure max protein, fluticasone, gabapentin, glucosamine-chondroitin, insulin lispro, insulin pen needle, isosorbide mononitrate, onetouch ultrasoft, multivitamin with minerals, ofloxacin, olmesartan, omeprazole, ondansetron, onetouch verio, prednisolone acetate, torsemide, tramadol, and vitamin b-12, and the following Facility-Administered Medications: fentanyl. Her primarily concern today is the Knee Pain (right )  Initial Vital Signs:  Pulse/HCG Rate: 70ECG Heart Rate: 68 Temp: (!) 97.2 F (36.2 C) Resp: 16 BP: (!) 165/78 SpO2: 97 %  BMI: Estimated body mass index is 47.44 kg/m as calculated from the following:   Height as of this encounter: 5\' 8"  (1.727 m).   Weight as of this encounter: 312 lb (141.5 kg).  Risk Assessment: Allergies: Reviewed. She is allergic to statins; penicillins; hydralazine; codeine; ibuprofen; percocet  [oxycodone-acetaminophen]; tramadol; and vicodin [hydrocodone-acetaminophen].  Allergy Precautions: None required Coagulopathies: Reviewed. None identified.  Blood-thinner therapy: None at this time Active Infection(s): Reviewed. None identified. Rachel Holmes is afebrile  Site Confirmation: Rachel Holmes was asked to confirm the procedure and laterality before marking the site Procedure checklist: Completed Consent: Before the procedure and under the influence of no sedative(s), amnesic(s), or anxiolytics, the patient was informed of the treatment options, risks and possible complications. To fulfill our ethical and legal obligations, as recommended by the American Medical Association's Code of Ethics, I have informed the patient of my clinical impression; the nature and purpose of the treatment or procedure; the risks, benefits, and possible complications of the intervention; the alternatives, including doing nothing; the risk(s) and benefit(s) of the alternative treatment(s) or procedure(s); and the risk(s) and benefit(s) of doing nothing. The patient was provided information about the general risks and possible complications associated with the procedure. These may include, but are not limited to: failure to achieve desired goals, infection, bleeding, organ or nerve damage, allergic reactions, paralysis, and death. In addition, the patient was informed of those risks and complications associated to the procedure, such as failure to decrease pain; infection; bleeding; organ or nerve damage with subsequent damage to sensory, motor, and/or autonomic systems, resulting in permanent pain, numbness, and/or weakness of one or several areas of the body; allergic reactions; (i.e.: anaphylactic reaction); and/or death. Furthermore, the patient was informed of those risks and complications associated with the medications. These include, but are not limited to: allergic reactions (i.e.: anaphylactic or  anaphylactoid reaction(s)); adrenal axis suppression; blood sugar elevation that in diabetics may result in ketoacidosis or comma; water retention that in patients with history of congestive heart failure may result in shortness of breath, pulmonary edema, and decompensation with resultant heart failure; weight gain; swelling or edema; medication-induced neural toxicity; particulate matter embolism and blood vessel occlusion with resultant organ, and/or nervous system infarction; and/or aseptic necrosis of one or more joints. Finally, the patient was informed that Medicine is not an exact science; therefore, there is also the possibility of unforeseen or unpredictable risks and/or possible complications that may result in a catastrophic outcome. The patient indicated having understood very clearly. We have given the patient no guarantees and we have made no promises. Enough time was given to the patient to ask questions, all of which were answered to the patient's satisfaction. Ms. Edelen has indicated that she wanted to continue with the procedure. Attestation: I, the ordering provider, attest that I have discussed with the patient the benefits, risks, side-effects, alternatives, likelihood of achieving goals, and potential problems during recovery for the procedure that I have provided informed consent. Date  Time: 02/07/2020  7:53 AM  Pre-Procedure Preparation:  Monitoring: As per clinic protocol. Respiration, ETCO2, SpO2, BP, heart rate and rhythm monitor placed and checked for adequate function Safety Precautions: Patient was assessed for positional comfort and pressure points before starting the procedure. Time-out: I initiated and conducted the "Time-out" before starting the procedure, as per protocol. The patient was asked to participate by confirming the accuracy of the "Time Out" information. Verification of the correct person, site, and procedure were performed and confirmed by me, the nursing  staff, and the patient. "Time-out" conducted as per Joint Commission's Universal Protocol (UP.01.01.01). Time: 7416  Description of Procedure:          Target Area: For Genicular Nerve block(s), the targets are: the superior-lateral genicular nerve, located in the lateral distal portion of the femoral shaft as it curves to form the lateral epicondyle, in the region of the distal femoral metaphysis; the superior-medial genicular nerve, located in the medial distal portion of the  femoral shaft as it curves to form the medial epicondyle; and the inferior-medial genicular nerve, located in the medial, proximal portion of the tibial shaft, as it curves to form the medial epicondyle, in the region of the proximal tibial metaphysis. Approach: Anterior, ipsilateral approach. Area Prepped: Entire knee area, from mid-thigh to mid-shin, lateral, anterior, and medial aspects. DuraPrep (Iodine Povacrylex [0.7% available iodine] and Isopropyl Alcohol, 74% w/w) Safety Precautions: Aspiration looking for blood return was conducted prior to all injections. At no point did we inject any substances, as a needle was being advanced. No attempts were made at seeking any paresthesias. Safe injection practices and needle disposal techniques used. Medications properly checked for expiration dates. SDV (single dose vial) medications used. Description of the Procedure: Protocol guidelines were followed. The patient was placed in position over the procedure table. The target area was identified and the area prepped in the usual manner. The skin and muscle were infiltrated with local anesthetic. Appropriate amount of time allowed to pass for local anesthetics to take effect. Radiofrequency needles were introduced to the target area using fluoroscopic guidance. Using the NeuroTherm NT1100 Radiofrequency Generator, sensory stimulation using 50 Hz was used to locate & identify the nerve, making sure that the needle was positioned such  that there was no sensory stimulation below 0.3 V or above 0.7 V. Stimulation using 2 Hz was used to evaluate the motor component. Care was taken not to lesion any nerves that demonstrated motor stimulation of the lower extremities at an output of less than 2.5 times that of the sensory threshold, or a maximum of 2.0 V. Once satisfactory placement of the needles was achieved, the numbing solution was slowly injected after negative aspiration. After waiting for at least 2 minutes, the ablation was performed at 80 degrees C for 60 seconds, using regular Radiofrequency settings. Once the procedure was completed, the needles were then removed and the area cleansed, making sure to leave some of the prepping solution back to take advantage of its long term bactericidal properties. Intra-operative Compliance: Compliant Vitals:   02/07/20 0837 02/07/20 0842 02/07/20 0848 02/07/20 0850  BP: (!) 180/72 (!) 183/81 (!) 181/86 (!) 185/82  Pulse:      Resp: 20 20 16 12   Temp:      TempSrc:      SpO2: 98% 99% 99% 100%  Weight:      Height:        Start Time: 0834 hrs. End Time: 0848 hrs. Materials & Medications:  Needle(s) Type: Teflon-coated, curved tip, Radiofrequency needle(s) Gauge: 22G Length: 10cm Medication(s): Please see orders for medications and dosing details. 6 cc solution made of 5 cc of 0.2% ropivacaine 1 cc of Decadron 10 mg/cc.  2 cc injected at each level above after sensorimotor testing prior to lesioning. Imaging Guidance (Non-Spinal):          Type of Imaging Technique: Fluoroscopy Guidance (Non-Spinal) Indication(s): Assistance in needle guidance and placement for procedures requiring needle placement in or near specific anatomical locations not easily accessible without such assistance. Exposure Time: Please see nurses notes. Contrast: Before injecting any contrast, we confirmed that the patient did not have an allergy to iodine, shellfish, or radiological contrast. Once satisfactory  needle placement was completed at the desired level, radiological contrast was injected. Contrast injected under live fluoroscopy. No contrast complications. See chart for type and volume of contrast used. Fluoroscopic Guidance: I was personally present during the use of fluoroscopy. "Tunnel Vision Technique" used to obtain the best  possible view of the target area. Parallax error corrected before commencing the procedure. "Direction-depth-direction" technique used to introduce the needle under continuous pulsed fluoroscopy. Once target was reached, antero-posterior, oblique, and lateral fluoroscopic projection used confirm needle placement in all planes. Images permanently stored in EMR. Interpretation: I personally interpreted the imaging intraoperatively. Adequate needle placement confirmed in multiple planes. Appropriate spread of contrast into desired area was observed. No evidence of afferent or efferent intravascular uptake. Permanent images saved into the patient's record.  Antibiotic Prophylaxis:   Anti-infectives (From admission, onward)   None     Indication(s): None identified  Post-operative Assessment:  Post-procedure Vital Signs:  Pulse/HCG Rate: 7073 Temp: (!) 97.2 F (36.2 C) Resp: 12 BP: (!) 185/82 SpO2: 100 %  EBL: None  Complications: No immediate post-treatment complications observed by team, or reported by patient.  Note: The patient tolerated the entire procedure well. A repeat set of vitals were taken after the procedure and the patient was kept under observation following institutional policy, for this type of procedure. Post-procedural neurological assessment was performed, showing return to baseline, prior to discharge. The patient was provided with post-procedure discharge instructions, including a section on how to identify potential problems. Should any problems arise concerning this procedure, the patient was given instructions to immediately contact us, at any  time, without hesitation. In any case, we plan to contact the patient by telephone for a follow-up status report regarding this interventional procedure.  Comments:  No additional relevant information.  Plan of Care  Orders:  Orders Placed This Encounter  Procedures  . DG PA[facility] C-ARM 1-60 MIN NO REPORT    Intraoperative interpretation by procedural physician at [facility].    Standing Status:   Standing    Number of Occurrences:   1    Order Specific Question:   Reason for exam:    Answer:   Assistance in needle guidance and placement for procedures requiring needle placement in or near specific anatomical locations not easily accessible without such assistance.    Medications ordered for procedure: Meds ordered this encounter  Medications  . lidocaine (XYLOCAINE) 2 % (with pres) injection 400 mg  . fentaNYL (SUBLIMAZE) injection 25-50 mcg    Make sure Narcan is available in the pyxis when using this medication. In the event of respiratory depression (RR< 8/min): Titrate NARCAN (naloxone) in increments of 0.1 to 0.2 mg IV at 2-3 minute intervals, until desired degree of reversal.  . dexamethasone (DECADRON) injection 10 mg  . ropivacaine (PF) 2 mg/mL (0.2%) (NAROPIN) injection 9 mL   Medications administered: We administered lidocaine, fentaNYL, dexamethasone, and ropivacaine (PF) 2 mg/mL (0.2%).  See the medical record for exact dosing, route, and time of administration.  Follow-up plan:   Return in about 6 weeks (around 03/20/2020) for Post Procedure Evaluation, virtual.      s/p R knee hyalgan 08/30/2019 and 09/13/2019, R knee steroid injection on 09/29/2019- not helpful,  R knee GNB- 11/08/19- 75% pain relief for 3-4 weeks,R knee RFA 02/07/20      Recent Visits Date Type Provider Dept  12/13/19 Office Visit Gillis Santa, MD [facility]  Showing recent visits within past 90 days and meeting all other requirements   Today's Visits Date Type Provider  Dept  02/07/20 Procedure visit Gillis Santa, MD [facility]  Showing today's visits and meeting all other requirements   Future Appointments No visits were found meeting these conditions.  Showing future appointments within next 90 days  and meeting all other requirements   Disposition: Discharge home  Discharge (Date  Time): 02/07/2020;   hrs.   Primary Care Physician: Sharyne Peach, MD Location: [facility] Note by: Gillis Santa, MD Date: 02/07/2020; Time: 8:59 AM  Disclaimer:  Medicine is not an exact science. The only guarantee in medicine is that nothing is guaranteed. It is important to note that the decision to proceed with this intervention was based on the information collected from the patient. The Data and conclusions were drawn from the patient's questionnaire, the interview, and the physical examination. Because the information was provided in large part by the patient, it cannot be guaranteed that it has not been purposely or unconsciously manipulated. Every effort has been made to obtain as much relevant data as possible for this evaluation. It is important to note that the conclusions that lead to this procedure are derived in large part from the available data. Always take into account that the treatment will also be dependent on availability of resources and existing treatment guidelines, considered by other Pain Management Practitioners as being common knowledge and practice, at the time of the intervention. For Medico-Legal purposes, it is also important to point out that variation in procedural techniques and pharmacological choices are the acceptable norm. The indications, contraindications, technique, and results of the above procedure should only be interpreted and judged by a Board-Certified Interventional Pain Specialist with extensive familiarity and expertise in the same exact procedure and technique.

## 2020-02-07 NOTE — Progress Notes (Signed)
Safety precautions to be maintained throughout the outpatient stay will include: orient to surroundings, keep bed in low position, maintain call bell within reach at all times, provide assistance with transfer out of bed and ambulation.  

## 2020-02-08 ENCOUNTER — Telehealth: Payer: Self-pay

## 2020-02-08 NOTE — Telephone Encounter (Signed)
POst procedure phone call.  Patient states she is doing well.

## 2020-02-09 ENCOUNTER — Ambulatory Visit: Payer: Medicare Other

## 2020-02-10 ENCOUNTER — Telehealth: Payer: Self-pay

## 2020-02-10 NOTE — Telephone Encounter (Signed)
Pt is NOT on Novolog Flexpen therefore PA is NOT required.  Outpatient Medication Detail   Disp Refills Start End   insulin lispro (HUMALOG KWIKPEN) 100 UNIT/ML KwikPen 20 pen 3 01/27/2020    Sig: 3 times a day (just before each meal) 15-17-15 units, and pen needles 3 day   Sent to pharmacy as: insulin lispro (HUMALOG KWIKPEN) 100 UNIT/ML KwikPen   E-Prescribing Status: Receipt confirmed by pharmacy (01/27/2020 10:27 AM EDT)

## 2020-02-10 NOTE — Telephone Encounter (Signed)
Is this just an FYI?

## 2020-02-10 NOTE — Telephone Encounter (Signed)
Rachel Holmes from [facility] calling to see if we received a PA for Novolog Flex pen-after checking notes to do not see any documentation of this- I did request her to fax again at [phone] and she requested a call back at [phone] if we do not receive this

## 2020-02-14 ENCOUNTER — Encounter (INDEPENDENT_AMBULATORY_CARE_PROVIDER_SITE_OTHER): Payer: Self-pay | Admitting: Nurse Practitioner

## 2020-02-14 ENCOUNTER — Ambulatory Visit: Payer: Medicare Other

## 2020-02-14 ENCOUNTER — Ambulatory Visit (INDEPENDENT_AMBULATORY_CARE_PROVIDER_SITE_OTHER): Payer: Medicare Other | Admitting: Nurse Practitioner

## 2020-02-14 ENCOUNTER — Telehealth: Payer: Self-pay | Admitting: Cardiovascular Disease

## 2020-02-14 ENCOUNTER — Other Ambulatory Visit: Payer: Self-pay

## 2020-02-14 VITALS — BP 187/84 | HR 75 | Resp 16

## 2020-02-14 DIAGNOSIS — Z89512 Acquired absence of left leg below knee: Secondary | ICD-10-CM | POA: Diagnosis not present

## 2020-02-14 NOTE — Telephone Encounter (Signed)
Spoke with patient and reviewed that she had previously been on 60 mg twice a day but it was changed to 120 mg once daily. She reports that her PCP had already started her on 120 mg twice a day prior to appointment with [name]. Since we are not able to see their information requested that she please call her PCP office to clarify medication instructions and to let me know if she should have any further problems or needs. She verbalized understanding and was thankful for the call back.

## 2020-02-14 NOTE — Telephone Encounter (Signed)
Pt c/o medication issue:  1. Name of Medication: isosorbide   2. How are you currently taking this medication (dosage and times per day)? 120 mg daily  3. Are yopu having a reaction (difficulty breathing--STAT)? no  4. What is your medication issue? Patient states she was taking twice and day and when she went to get latest refill the prescription was changed to once a day. Patient states her BP is increasing more and that her PCP wants her on bid  Please advise

## 2020-02-15 ENCOUNTER — Encounter (INDEPENDENT_AMBULATORY_CARE_PROVIDER_SITE_OTHER): Payer: Self-pay | Admitting: Nurse Practitioner

## 2020-02-15 NOTE — Progress Notes (Signed)
Subjective:    Patient ID: Rachel Holmes, female    DOB: [birth year], 59 y.o.   MRN: [id number] Chief Complaint  Patient presents with  . Follow-up    Today the patient presents today due to issues with current prosthetic.  The patient has a previous history of a left below-knee amputation due to Charcot foot.  The patient's physical therapy has been hampered due to the fact that her current prosthetic does not fit properly.  The prosthetic is actually too large at this time.  The patient states that she wears 3 shrinker socks at a time in order to try to be able to get the leg to fit.  Despite the multiple modifications and interventions the leg does not fit properly and in fact it turns in medially.  This makes it very difficult for her to walk.  The patient also endorses having pain and discomfort her stop now due to the multiple shrinker socks.  There are no ulcerations currently.  Other than this issue the patient has been diligently working with physical therapy.   Review of Systems  Musculoskeletal: Positive for arthralgias.  All other systems reviewed and are negative.      Objective:   Physical Exam Vitals reviewed.  Cardiovascular:     Pulses: Normal pulses.  Musculoskeletal:     Left Lower Extremity: Left leg is amputated below knee.  Neurological:     Mental Status: She is alert.  Psychiatric:        Mood and Affect: Mood normal.        Behavior: Behavior normal.        Thought Content: Thought content normal.        Judgment: Judgment normal.     BP (!) 187/84 (BP Location: Left Arm)   Pulse 75   Resp 16   Past Medical History:  Diagnosis Date  . Allergic rhinitis   . Allergy   . Anemia   . Anxiety   . Arthritis   . Back pain   . Bradycardia   . Breast mass    Patient can no longer palpate specific masses but showed tech general area of concern  . CHF (congestive heart failure) ([place])   . CKD (chronic kidney disease)    STAGE 3  . Constipation   .  Diabetes mellitus without complication ([place])   . Diabetic nephropathy ([place])   . Dyspnea   . GERD (gastroesophageal reflux disease)   . Heart murmur   . HLD (hyperlipidemia)   . Hyperparathyroidism ([place])   . Hypertension   . Joint pain   . Leg edema   . Legally blind in left eye, as defined in [place]   . Lymphedema   . Obesity   . Onychomycosis     Social History   Socioeconomic History  . Marital status: Divorced    Spouse name: Not on file  . Number of children: 1  . Years of education: Not on file  . Highest education level: Not on file  Occupational History  . Occupation: Glass blower/designer    Comment: disability  Tobacco Use  . Smoking status: Never Smoker  . Smokeless tobacco: Never Used  Substance and Sexual Activity  . Alcohol use: No  . Drug use: Never  . Sexual activity: Not on file  Other Topics Concern  . Not on file  Social History Narrative   ** Merged History Encounter **    on disability   Social Determinants of  Health   Financial Resource Strain:   . Difficulty of Paying Living Expenses:   Food Insecurity:   . Worried About Charity fundraiser in the Last Year:   . Arboriculturist in the Last Year:   Transportation Needs:   . Film/video editor (Medical):   [name] Lack of Transportation (Non-Medical):   Physical Activity:   . Days of Exercise per Week:   . Minutes of Exercise per Session:   Stress:   . Feeling of Stress :   Social Connections:   . Frequency of Communication with Friends and Family:   . Frequency of Social Gatherings with Friends and Family:   . Attends Religious Services:   . Active Member of Clubs or Organizations:   . Attends Archivist Meetings:   [name] Marital Status:   Intimate Partner Violence:   . Fear of Current or Ex-Partner:   . Emotionally Abused:   [name] Physically Abused:   . Sexually Abused:     Past Surgical History:  Procedure Laterality Date  . ABDOMINAL HYSTERECTOMY    . AMPUTATION Left 05/05/2019    Procedure: AMPUTATION BELOW KNEE;  Surgeon: Katha Cabal, MD;  Location: [facility] ORS;  Service: Vascular;  Laterality: Left;  . BREAST BIOPSY Left 2014   FNA 12:00 position - Negative  . EYE SURGERY Left 2007   removed a lens, no lens implanted  . IR FLUORO GUIDE CV LINE RIGHT  12/29/2018  . IR REMOVAL TUN CV CATH W/O FL  02/19/2019  . IR US GUIDE VASC ACCESS RIGHT  12/29/2018  . TEE WITHOUT CARDIOVERSION N/A 12/28/2018   Procedure: TRANSESOPHAGEAL ECHOCARDIOGRAM (TEE);  Surgeon: Sueanne Margarita, MD;  [facility] ENDOSCOPY;  Service: Cardiovascular;  Laterality: N/A;  . VAGINAL HYSTERECTOMY     abdominal hyst, not a vaginal hyst    Family History  Problem Relation Age of Onset  . Breast cancer Sister 60  . Diabetes Sister   . Diabetes Mother   . Hypertension Mother   . Hyperlipidemia Mother   . Eating disorder Mother   . Obesity Mother     Allergies  Allergen Reactions  . Statins Shortness Of Breath and Other (See Comments)    Wheezing, short of breath  . Penicillins Hives and Other (See Comments)    Has patient had a PCN reaction causing immediate rash, facial/tongue/throat swelling, SOB or lightheadedness with hypotension: No Has patient had a PCN reaction causing severe rash involving mucus membranes or skin necrosis: No Has patient had a PCN reaction that required hospitalization No Has patient had a PCN reaction occurring within the last 10 years: No If all of the above answers are "NO", then may proceed with Cephalosporin use.  [name] Hydralazine Itching  . Codeine Nausea Only  . Ibuprofen Other (See Comments)    Reaction:  Raises pts BP  . Percocet [Oxycodone-Acetaminophen] Nausea Only  . Tramadol Nausea Only and Other (See Comments)    Can take if she has eaten. Currently takes with good results   . Vicodin [Hydrocodone-Acetaminophen] Itching and Nausea Only       Assessment & Plan:   1. Hx of BKA, left ([place]) The patient should have a new prosthetic fitting to  help improved her mobility.  Her current prosthetic is to be and despite utilizing several socks it does not fit properly.  Based on this the patient should have her prosthetic refitted to allow for a proper fit that would allow her  to regain mobility.  Prosthetic replacement will also be important to reducing any possible stump issues further down the line.  The patient will continue to follow with [name] on an as-needed basis.     Current Outpatient Medications on File Prior to Visit  Medication Sig Dispense Refill  . albuterol (PROVENTIL HFA;VENTOLIN HFA) 108 (90 Base) MCG/ACT inhaler Inhale 1-2 puffs into the lungs every 6 (six) hours as needed for wheezing or shortness of breath.    [name] amLODipine (NORVASC) 10 MG tablet Take 10 mg by mouth daily.    [name] aspirin EC 81 MG EC tablet Take 1 tablet (81 mg total) by mouth daily.    . Blood Glucose Monitoring Suppl (ONE TOUCH ULTRA 2) w/Device KIT 1 Device by Does not apply route daily. (Patient taking differently: 1 Device by Does not apply route 3 (three) times daily. ) 1 each 0  . clobetasol cream (TEMOVATE) 3.87 % Apply 1 application topically 2 (two) times daily.    . cloNIDine (CATAPRES) 0.3 MG tablet Take 0.3 mg by mouth 2 (two) times daily.    . Continuous Blood Gluc Receiver (FREESTYLE LIBRE 14 DAY READER) DEVI 1 each by Does not apply route See admin instructions. For continuous glucose monitoring; E11.9 1 each 0  . Continuous Blood Gluc Sensor (FREESTYLE LIBRE 14 DAY SENSOR) MISC 1 Device by Does not apply route every 14 (fourteen) days. For use with continuous glucose monitoring system; E11.9 6 each 3  . docusate sodium (COLACE) 100 MG capsule Take 1 capsule (100 mg total) by mouth 2 (two) times daily. 10 capsule 0  . Dulaglutide (TRULICITY) 3 FI/4.3PI SOPN Inject 3 mg into the skin once a week. 12 pen 3  . DULoxetine (CYMBALTA) 30 MG capsule Take 30 mg by mouth daily.     . Ensure Max Protein (ENSURE MAX PROTEIN) LIQD Take 330 mLs (11 oz total)  by mouth 2 (two) times daily.    . fluticasone (FLONASE) 50 MCG/ACT nasal spray Place 1 spray into both nostrils daily.     [name] gabapentin (NEURONTIN) 600 MG tablet Take 600 mg by mouth at bedtime.    [name] glucosamine-chondroitin 500-400 MG tablet Take 1 tablet by mouth 2 (two) times daily.    . insulin lispro (HUMALOG KWIKPEN) 100 UNIT/ML KwikPen 3 times a day (just before each meal) 15-17-15 units, and pen needles 3 day 20 pen 3  . Insulin Pen Needle (BD PEN NEEDLE MICRO U/F) 32G X 6 MM MISC 1 each by Does not apply route 3 (three) times daily. 100 each 3  . isosorbide mononitrate (IMDUR) 120 MG 24 hr tablet Take 1 tablet (120 mg total) by mouth daily. 180 tablet 3  . Lancets (ONETOUCH ULTRASOFT) lancets Used to check blood sugars four times daily. (Patient taking differently: 1 each by Other route 3 (three) times daily. ) 200 each 12  . Multiple Vitamin (MULTIVITAMIN WITH MINERALS) TABS tablet Take 1 tablet by mouth daily.    [name] ofloxacin (OCUFLOX) 0.3 % ophthalmic solution Place 1 drop into both eyes in the morning, at noon, in the evening, and at bedtime.    [name] olmesartan (BENICAR) 20 MG tablet Take 40 mg by mouth daily. Pt is taking 44m daily    . omeprazole (PRILOSEC) 40 MG capsule Take 40 mg by mouth 2 (two) times daily.    . ondansetron (ZOFRAN) 4 MG tablet Take 4 mg by mouth every 8 (eight) hours as needed for nausea or vomiting.    .[name]  ONETOUCH VERIO test strip USE  STRIP TO CHECK GLUCOSE TWICE DAILY 100 each 1  . prednisoLONE acetate (PRED FORTE) 1 % ophthalmic suspension Place 1 drop into both eyes in the morning, at noon, in the evening, and at bedtime.    . torsemide (DEMADEX) 20 MG tablet Take 1 tablet (20 mg total) by mouth 2 (two) times daily. Take an extra 27m tablet daily prn in the morning for swelling or sob.    . traMADol (ULTRAM) 50 MG tablet One to Two Tabs Every Six Hours As Needed For Pain 40 tablet 0  . vitamin B-12 (CYANOCOBALAMIN) 1000 MCG tablet Take 1,000 mcg by mouth daily.      No current facility-administered medications on file prior to visit.    There are no Patient Instructions on file for this visit. No follow-ups on file.   FKris Hartmann NP

## 2020-02-16 ENCOUNTER — Ambulatory Visit: Payer: Medicare Other

## 2020-02-21 ENCOUNTER — Ambulatory Visit: Payer: Medicare Other

## 2020-02-23 ENCOUNTER — Ambulatory Visit: Payer: Medicare Other

## 2020-02-28 ENCOUNTER — Ambulatory Visit: Payer: Medicare Other

## 2020-03-01 ENCOUNTER — Ambulatory Visit: Payer: Medicare Other

## 2020-03-08 ENCOUNTER — Ambulatory Visit: Payer: Medicare Other

## 2020-03-21 ENCOUNTER — Encounter: Payer: Self-pay | Admitting: Student in an Organized Health Care Education/Training Program

## 2020-03-21 ENCOUNTER — Telehealth: Payer: Self-pay | Admitting: *Deleted

## 2020-03-21 NOTE — Telephone Encounter (Signed)
No additional notes

## 2020-03-22 ENCOUNTER — Ambulatory Visit
Payer: Medicare Other | Attending: Student in an Organized Health Care Education/Training Program | Admitting: Student in an Organized Health Care Education/Training Program

## 2020-03-22 ENCOUNTER — Other Ambulatory Visit: Payer: Self-pay

## 2020-03-22 DIAGNOSIS — Z89512 Acquired absence of left leg below knee: Secondary | ICD-10-CM

## 2020-03-22 DIAGNOSIS — M25561 Pain in right knee: Secondary | ICD-10-CM

## 2020-03-22 DIAGNOSIS — G8929 Other chronic pain: Secondary | ICD-10-CM

## 2020-03-22 DIAGNOSIS — N184 Chronic kidney disease, stage 4 (severe): Secondary | ICD-10-CM

## 2020-03-22 DIAGNOSIS — G894 Chronic pain syndrome: Secondary | ICD-10-CM

## 2020-03-22 DIAGNOSIS — Z6841 Body Mass Index (BMI) 40.0 and over, adult: Secondary | ICD-10-CM

## 2020-03-22 DIAGNOSIS — I739 Peripheral vascular disease, unspecified: Secondary | ICD-10-CM

## 2020-03-22 DIAGNOSIS — M1711 Unilateral primary osteoarthritis, right knee: Secondary | ICD-10-CM | POA: Diagnosis not present

## 2020-03-22 MED ORDER — METHOCARBAMOL 750 MG PO TABS: 750.0000 mg | ORAL_TABLET | Freq: Two times a day (BID) | ORAL | 2 refills | Status: DC | PRN

## 2020-03-22 NOTE — Progress Notes (Signed)
Patient: Rachel Holmes  Service Category: E/M  Provider: Gillis Santa, MD  DOB: Feb 28, 1961  DOS: 03/22/2020  Location: Office  MRN: [id number]  Setting: Ambulatory outpatient  Referring Provider: Sharyne Peach, MD  Type: Established Patient  Specialty: Interventional Pain Management  PCP: Rachel Peach, MD  Location: Home  Delivery: TeleHealth     Virtual Encounter - Pain Management PROVIDER NOTE: Information contained herein reflects review and annotations entered in association with encounter. Interpretation of such information and data should be left to medically-trained personnel. Information provided to patient can be located elsewhere in the medical record under "Patient Instructions". Document created using STT-dictation technology, any transcriptional errors that may result from process are unintentional.    Contact & Pharmacy Preferred: [phone] Home: [phone] (home) Mobile: [phone] (mobile) E-mail: [email or web address]  [facility] ([address]), [place] [address] Phone: [phone] Fax: [phone]  [id number] [place] [address] [id number]: [phone]: [phone] [place]: [phone]: [phone]  Pre-screening  [name] offered "in-person" vs "virtual" encounter. She indicated preferring virtual for this encounter.   Reason COVID-19*  Social distancing based on CDC and AMA recommendations.   I contacted Rachel MITTMANon 03/22/2020 via video conference.      I clearly identified myself as BGillis Santa MD. I verified that I was speaking with the correct person using two identifiers (Name: Rachel Holmes and date of birth: 5Nov 05, 1962.  Consent I sought verbal advanced consent from [name] virtual visit interactions. I informed [name] of possible security and privacy concerns, risks, and limitations associated with providing "not-in-person" medical evaluation and management services. I also informed [name] of the availability of "in-person" appointments. Finally, I informed her that there would be a charge for the virtual visit and that she could be  personally, fully or partially, financially responsible for it. Ms. MChowningexpressed understanding and agreed to proceed.   Historic Elements   [name] a 59y.o. year old, female patient evaluated today after her last contact with our practice on 03/21/2020. Ms. MTalsma has a past medical history of Allergic rhinitis, Allergy, Anemia, Anxiety, Arthritis, Back pain, Bradycardia, Breast mass, CHF (congestive heart failure) ([place], CKD (chronic kidney disease), Constipation, Diabetes mellitus without complication ([place], Diabetic nephropathy ([place], Dyspnea, GERD (gastroesophageal reflux disease), Heart murmur, HLD (hyperlipidemia), Hyperparathyroidism ([place], Hypertension, Joint pain, Leg edema, Legally blind in left eye, as defined [place] Lymphedema, Obesity, and Onychomycosis. She also  has a past surgical history that includes Abdominal hysterectomy; TEE without cardioversion (N/A, 12/28/2018); IR Fluoro Guide CV Line Right (12/29/2018); IR [place] Vasc Access Right (12/29/2018); IR Removal Tun Cv Cath W/O FL (02/19/2019); Vaginal hysterectomy; Breast biopsy (Left, 2014); Eye surgery (Left, 2007); and Amputation (Left, 05/05/2019). Ms. MKilmerhas a current medication list which includes the following prescription(s): albuterol, amlodipine, aspirin, one touch ultra 2, clobetasol cream, clonidine, docusate sodium, trulicity, duloxetine, ensure max protein, fluticasone, gabapentin, glucosamine-chondroitin, insulin lispro, insulin pen needle, isosorbide mononitrate, onetouch ultrasoft, multivitamin with minerals,  olmesartan, omeprazole, ondansetron, onetouch verio, torsemide, tramadol, vitamin b-12, ezetimibe, methocarbamol, ofloxacin, and prednisolone acetate. She  reports that she has never smoked. She has never used smokeless tobacco. She reports that she does not  drink alcohol or use drugs. [name] is allergic to statins; penicillins; hydralazine; codeine; ibuprofen; percocet [oxycodone-acetaminophen]; tramadol; and vicodin [hydrocodone-acetaminophen].   HPI  Today, she is being contacted for a post-procedure assessment.   Evaluation of last interventional procedure  02/07/2020 Procedure:   Type: Therapeutic Superior-lateral, Superior-medial, and Inferior-medial, Genicular Nerve Radiofrequency Ablation.  #1  Region: Lateral, Anterior, and Medial aspects of the knee joint, above and below the knee joint proper. Level: Superior and inferior to the knee joint. Laterality: Right  Sedation: Please see nurses note for DOS. When no sedatives are used, the analgesic levels obtained are directly associated to the effectiveness of the local anesthetics. However, when sedation is provided, the level of analgesia obtained during the initial 1 hour following the intervention, is believed to be the result of a combination of factors. These factors may include, but are not limited to: 1. The effectiveness of the local anesthetics used. 2. The effects of the analgesic(s) and/or anxiolytic(s) used. 3. The degree of discomfort experienced by the patient at the time of the procedure. 4. The patients ability and reliability in recalling and recording the events. 5. The presence and influence of possible secondary gains and/or psychosocial factors. Reported result: Relief experienced during the 1st hour after the procedure: 75 % (Ultra-Short Term Relief)            Interpretative annotation: Clinically appropriate result. Analgesia during this period is likely to be Local Anesthetic and/or IV Sedative  (Analgesic/Anxiolytic) related.          Effects of local anesthetic: The analgesic effects attained during this period are directly associated to the localized infiltration of local anesthetics and therefore cary significant diagnostic value as to the etiological location, or anatomical origin, of the pain. Expected duration of relief is directly dependent on the pharmacodynamics of the local anesthetic used. Long-acting (4-6 hours) anesthetics used.  Reported result: Relief during the next 4 to 6 hour after the procedure: 75 % (Short-Term Relief)            Interpretative annotation: Clinically appropriate result. Analgesia during this period is likely to be Local Anesthetic-related.          Long-term benefit: Defined as the period of time past the expected duration of local anesthetics (1 hour for short-acting and 4-6 hours for long-acting). With the possible exception of prolonged sympathetic blockade from the local anesthetics, benefits during this period are typically attributed to, or associated with, other factors such as analgesic sensory neuropraxia, antiinflammatory effects, or beneficial biochemical changes provided by agents other than the local anesthetics.  Reported result: Extended relief following procedure: 90 % (Long-Term Relief)            Interpretative annotation: Clinically appropriate result. Good relief. Therapeutic success.    Benefit could signal adequate RF ablation.  UDS:  Summary  Date Value Ref Range Status  07/13/2019 Note  Final    Comment:    ==================================================================== Compliance Drug Analysis, Ur ==================================================================== Test                             Result       Flag       Units Drug Present and Declared for Prescription Verification   Tramadol                       5145         EXPECTED   ng/mg  creat   O-Desmethyltramadol            3374         EXPECTED   ng/mg creat    N-Desmethyltramadol            692          EXPECTED   ng/mg creat    Source of tramadol is a prescription medication. O-desmethyltramadol    and N-desmethyltramadol are expected metabolites of tramadol.   Gabapentin                     PRESENT      EXPECTED   Duloxetine                     PRESENT      EXPECTED   Clonidine                      PRESENT      EXPECTED Drug Present not Declared for Prescription Verification   Diphenhydramine                PRESENT      UNEXPECTED Drug Absent but Declared for Prescription Verification   Salicylate                     Not Detected UNEXPECTED    Aspirin, as indicated in the declared medication list, is not always    detected even when used as directed. ==================================================================== Test                      Result    Flag   Units      Ref Range   Creatinine              96               mg/dL      >=20 ==================================================================== Declared Medications:  The flagging and interpretation on this report are based on the  following declared medications.  Unexpected results may arise from  inaccuracies in the declared medications.  **Note: The testing scope of this panel includes these medications:  Clonidine  Duloxetine  Gabapentin  Tramadol  **Note: The testing scope of this panel does not include small to  moderate amounts of these reported medications:  Aspirin  **Note: The testing scope of this panel does not include the  following reported medications:  Albuterol  Amlodipine  Chondroitin  Cyanocobalamin  Docusate  Dulaglutide  Fluticasone (Flonase)  Glucosamine  Insulin  Isosorbide (Imdur)  Multivitamin  Omeprazole  Ondansetron  Sodium Bicarbonate  Supplement  Topical  Torsemide  Vitamin C ==================================================================== For clinical consultation, please call [phone]. ====================================================================    Laboratory Chemistry Profile   Renal Lab Results  Component Value Date   BUN 66 (H) 05/28/2019   CREATININE 3.25 (H) 05/28/2019   BCR 17 06/25/2018   GFRAA 17 (L) 05/28/2019   GFRNONAA 15 (L) 05/28/2019     Hepatic Lab Results  Component Value Date   AST 27 05/28/2019   ALT 23 05/28/2019   ALBUMIN 4.0 05/28/2019   ALKPHOS 144 (H) 05/28/2019   LIPASE 103 11/19/2013     Electrolytes Lab Results  Component Value Date   NA 136 05/28/2019   K 4.3 05/28/2019   CL 103 05/28/2019   CALCIUM 8.8 (L) 05/28/2019   MG 2.4 05/12/2019   PHOS 4.5 09/09/2014  Bone Lab Results  Component Value Date   VD25OH 28.5 (L) 06/25/2018     Inflammation (CRP: Acute Phase) (ESR: Chronic Phase) Lab Results  Component Value Date   CRP 29.7 (H) 12/26/2018   ESRSEDRATE 102 (H) 12/26/2018   LATICACIDVEN 2.1 ([place]) 12/23/2018       Note: Above Lab results reviewed.  Imaging  DG PAIN CLINIC C-ARM 1-60 MIN NO REPORT Fluoro was used, but no Radiologist interpretation will be provided.  Please refer to "NOTES" tab for provider progress note.  Assessment  The primary encounter diagnosis was Primary osteoarthritis of right knee. Diagnoses of Chronic pain of right knee, Chronic pain syndrome, Hx of BKA, left (HCC), PVD (peripheral vascular disease) ([place]), Stage 4 chronic kidney disease ([place]), and Morbid obesity with BMI of 45.0-49.9, adult [facility]) were also pertinent to this visit.  Plan of Care  [name] has a current medication list which includes the following long-term medication(s): albuterol, clonidine, duloxetine, gabapentin, insulin lispro, isosorbide mononitrate, olmesartan, omeprazole, onetouch verio, and torsemide.  Overall, patient has responded well to right knee genicular radiofrequency ablation.  She endorses improvement in her pain and more comfort with weightbearing.  Can consider  repeating in the future if return of knee pain.  Otherwise we will refill Robaxin as below.  Follow-up in 3 months.  Pharmacotherapy (Medications Ordered): Meds ordered this encounter  Medications  . methocarbamol (ROBAXIN) 750 MG tablet    Sig: Take 1 tablet (750 mg total) by mouth every 12 (twelve) hours as needed for muscle spasms.    Dispense:  60 tablet    Refill:  2    Do not place this medication, or any other prescription from our practice, on "Automatic Refill". Patient may have prescription filled one day early if pharmacy is closed on scheduled refill date.   Orders:  No orders of the defined types were placed in this encounter.  Follow-up plan:   Return in about 3 months (around 06/22/2020) for Medication Management, in person.     s/p R knee hyalgan 08/30/2019 and 09/13/2019, R knee steroid injection on 09/29/2019- not helpful,  R knee GNB- 11/08/19- 75% pain relief for 3-4 weeks,R knee RFA 02/07/20 helped significantly, repeat as needed       Recent Visits Date Type Provider Dept  02/07/20 Procedure visit Rachel Santa, MD [facility] recent visits within past 90 days and meeting all other requirements   Today's Visits Date Type Provider Dept  03/22/20 Telemedicine Rachel Santa, MD [facility]  Showing today's visits and meeting all other requirements   Future Appointments No visits were found meeting these conditions.  Showing future appointments within next 90 days and meeting all other requirements   I discussed the assessment and treatment plan with the patient. The patient was provided an opportunity to ask questions and all were answered. The patient agreed with the plan and demonstrated an understanding of the instructions.  Patient advised to call back or seek an in-person evaluation if the symptoms or condition worsens.  Duration of encounter:30 minutes.  Note by: Rachel Santa, MD Date: 03/22/2020; Time: 2:32 PM

## 2020-03-30 ENCOUNTER — Ambulatory Visit: Payer: Medicare Other | Admitting: Endocrinology

## 2020-04-20 ENCOUNTER — Telehealth: Payer: Self-pay

## 2020-04-20 NOTE — Telephone Encounter (Signed)
Patient called and states that she needs to speak to Dr Cordelia Pen nurse, states that her insurance will not cover Novolog and she was changed to Humalog.  She states that she is itching on her back.  Wanted to know what to do.  Per Ammie, patient should call her PCP due to that Dr Loanne Drilling is on vacation and Humalog would not just be itchy on the back. Patient also has an appointment 05-04-20 at 130.  Patient stated that she understood and will call her PCP.

## 2020-05-04 ENCOUNTER — Other Ambulatory Visit: Payer: Self-pay

## 2020-05-04 ENCOUNTER — Ambulatory Visit (INDEPENDENT_AMBULATORY_CARE_PROVIDER_SITE_OTHER): Payer: Medicare Other | Admitting: Endocrinology

## 2020-05-04 VITALS — BP 138/80 | HR 77 | Wt 312.0 lb

## 2020-05-04 DIAGNOSIS — E1142 Type 2 diabetes mellitus with diabetic polyneuropathy: Secondary | ICD-10-CM | POA: Diagnosis not present

## 2020-05-04 DIAGNOSIS — IMO0002 Reserved for concepts with insufficient information to code with codable children: Secondary | ICD-10-CM

## 2020-05-04 DIAGNOSIS — E1165 Type 2 diabetes mellitus with hyperglycemia: Secondary | ICD-10-CM

## 2020-05-04 LAB — POCT GLYCOSYLATED HEMOGLOBIN (HGB A1C): Hemoglobin A1C: 7.7 % — AB (ref 4.0–5.6)

## 2020-05-04 MED ORDER — FREESTYLE LIBRE 14 DAY READER DEVI: 1.0000 | Freq: Once | 0 refills | Status: AC

## 2020-05-04 MED ORDER — FREESTYLE LIBRE 14 DAY SENSOR MISC: 1.0000 | 3 refills | Status: DC

## 2020-05-04 MED ORDER — INSULIN LISPRO (1 UNIT DIAL) 100 UNIT/ML (KWIKPEN): PEN_INJECTOR | SUBCUTANEOUS | 3 refills | Status: DC

## 2020-05-04 NOTE — Patient Instructions (Addendum)
please increase the insulin to 3 times a day (just before each meal) 15-17-18 units. I have sent a prescription to your pharmacy, for the continuous glucose monitor check your blood sugar 4 times a day.  vary the time of day when you check, between before the 3 meals, and at bedtime.  also check if you have symptoms of your blood sugar being too high or too low.  please keep a record of the readings and bring it to your next appointment here (or you can bring the meter itself).  You can write it on any piece of paper.  please call us sooner if your blood sugar goes below 70, or if you have a lot of readings over 200. Please come back for a follow-up appointment in 2 months.

## 2020-05-04 NOTE — Progress Notes (Signed)
Subjective:    Patient ID: Rachel Holmes, female    DOB: Jul 20, 1961, 59 y.o.   MRN: [id number]  HPI Pt returns for f/u of diabetes mellitus: DM type: Insulin-requiring type 2 Dx'ed: 1988, during a pregnancy, but it persisted after Complications: polyneuropathy, renal failure, leg ulcer, left BKA, PAD, and PDR.   Therapy: insulin since 9924, and trulicity DKA: never Severe hypoglycemia: 2 episodes (both many years ago).   Pancreatitis: never Pancreatic imaging: normal on 2008 [name].   Other: a trial off insulin in early 2020 was unsuccessful; renal failure limits rx options; she takes multiple daily injections; she does not need basal insulin; fructosamine corresponds to A1c approx 1.5% higher than A1c itself.   Interval history: no recent steroids.  pt states cbg's vary from 59-234.  It is in general lowest at lunch, and highest at HS.  She says she never misses the insulin. Pt requests continuous glucose monitor Past Medical History:  Diagnosis Date  . Allergic rhinitis   . Allergy   . Anemia   . Anxiety   . Arthritis   . Back pain   . Bradycardia   . Breast mass    Patient can no longer palpate specific masses but showed tech general area of concern  . CHF (congestive heart failure) ([place])   . CKD (chronic kidney disease)    STAGE 3  . Constipation   . Diabetes mellitus without complication ([place])   . Diabetic nephropathy ([place])   . Dyspnea   . GERD (gastroesophageal reflux disease)   . Heart murmur   . HLD (hyperlipidemia)   . Hyperparathyroidism ([place])   . Hypertension   . Joint pain   . Leg edema   . Legally blind in left eye, as defined in [place]   . Lymphedema   . Obesity   . Onychomycosis     Past Surgical History:  Procedure Laterality Date  . ABDOMINAL HYSTERECTOMY    . AMPUTATION Left 05/05/2019   Procedure: AMPUTATION BELOW KNEE;  Surgeon: Katha Cabal, MD;  Location: [facility] ORS;  Service: Vascular;  Laterality: Left;  . BREAST BIOPSY Left 2014   FNA  12:00 position - Negative  . EYE SURGERY Left 2007   removed a lens, no lens implanted  . IR FLUORO GUIDE CV LINE RIGHT  12/29/2018  . IR REMOVAL TUN CV CATH W/O FL  02/19/2019  . IR US GUIDE VASC ACCESS RIGHT  12/29/2018  . TEE WITHOUT CARDIOVERSION N/A 12/28/2018   Procedure: TRANSESOPHAGEAL ECHOCARDIOGRAM (TEE);  Surgeon: Sueanne Margarita, MD;  [facility] ENDOSCOPY;  Service: Cardiovascular;  Laterality: N/A;  . VAGINAL HYSTERECTOMY     abdominal hyst, not a vaginal hyst    Social History   Socioeconomic History  . Marital status: Divorced    Spouse name: Not on file  . Number of children: 1  . Years of education: Not on file  . Highest education level: Not on file  Occupational History  . Occupation: Glass blower/designer    Comment: disability  Tobacco Use  . Smoking status: Never Smoker  . Smokeless tobacco: Never Used  Vaping Use  . Vaping Use: Never used  Substance and Sexual Activity  . Alcohol use: No  . Drug use: Never  . Sexual activity: Not on file  Other Topics Concern  . Not on file  Social History Narrative   ** Merged History Encounter **    on disability   Social Determinants of Health  Financial Resource Strain:   . Difficulty of Paying Living Expenses:   Food Insecurity:   . Worried About Charity fundraiser in the Last Year:   . Arboriculturist in the Last Year:   Transportation Needs:   . Film/video editor (Medical):   [name] Lack of Transportation (Non-Medical):   Physical Activity:   . Days of Exercise per Week:   . Minutes of Exercise per Session:   Stress:   . Feeling of Stress :   Social Connections:   . Frequency of Communication with Friends and Family:   . Frequency of Social Gatherings with Friends and Family:   . Attends Religious Services:   . Active Member of Clubs or Organizations:   . Attends Archivist Meetings:   [name] Marital Status:   Intimate Partner Violence:   . Fear of Current or Ex-Partner:   . Emotionally  Abused:   [name] Physically Abused:   . Sexually Abused:     Current Outpatient Medications on File Prior to Visit  Medication Sig Dispense Refill  . albuterol (PROVENTIL HFA;VENTOLIN HFA) 108 (90 Base) MCG/ACT inhaler Inhale 1-2 puffs into the lungs every 6 (six) hours as needed for wheezing or shortness of breath.    [name] amLODipine (NORVASC) 10 MG tablet Take 10 mg by mouth daily.    [name] aspirin EC 81 MG EC tablet Take 1 tablet (81 mg total) by mouth daily.    . Blood Glucose Monitoring Suppl (ONE TOUCH ULTRA 2) w/Device KIT 1 Device by Does not apply route daily. (Patient taking differently: 1 Device by Does not apply route 3 (three) times daily. ) 1 each 0  . clobetasol cream (TEMOVATE) 1.65 % Apply 1 application topically 2 (two) times daily.    . cloNIDine (CATAPRES) 0.3 MG tablet Take 0.3 mg by mouth 2 (two) times daily.    [name] docusate sodium (COLACE) 100 MG capsule Take 1 capsule (100 mg total) by mouth 2 (two) times daily. 10 capsule 0  . Dulaglutide (TRULICITY) 3 VV/7.4MO SOPN Inject 3 mg into the skin once a week. 12 pen 3  . DULoxetine (CYMBALTA) 30 MG capsule Take 30 mg by mouth daily.     . Ensure Max Protein (ENSURE MAX PROTEIN) LIQD Take 330 mLs (11 oz total) by mouth 2 (two) times daily.    [name] ezetimibe (ZETIA) 10 MG tablet Take 10 mg by mouth daily.    . fluticasone (FLONASE) 50 MCG/ACT nasal spray Place 1 spray into both nostrils daily.     [name] gabapentin (NEURONTIN) 600 MG tablet Take 600 mg by mouth at bedtime.    [name] glucosamine-chondroitin 500-400 MG tablet Take 1 tablet by mouth 2 (two) times daily.    . Insulin Pen Needle (BD PEN NEEDLE MICRO U/F) 32G X 6 MM MISC 1 each by Does not apply route 3 (three) times daily. 100 each 3  . isosorbide mononitrate (IMDUR) 120 MG 24 hr tablet Take 1 tablet (120 mg total) by mouth daily. (Patient taking differently: Take 120 mg by mouth in the morning and at bedtime. ) 180 tablet 3  . Lancets (ONETOUCH ULTRASOFT) lancets Used to check blood sugars  four times daily. (Patient taking differently: 1 each by Other route 3 (three) times daily. ) 200 each 12  . methocarbamol (ROBAXIN) 750 MG tablet Take 1 tablet (750 mg total) by mouth every 12 (twelve) hours as needed for muscle spasms. 60 tablet 2  . Multiple Vitamin (MULTIVITAMIN  WITH MINERALS) TABS tablet Take 1 tablet by mouth daily.    [name] ofloxacin (OCUFLOX) 0.3 % ophthalmic solution Place 1 drop into both eyes in the morning, at noon, in the evening, and at bedtime.    [name] olmesartan (BENICAR) 20 MG tablet Take 40 mg by mouth daily. Pt is taking 25m daily    . omeprazole (PRILOSEC) 40 MG capsule Take 40 mg by mouth 2 (two) times daily.    . ondansetron (ZOFRAN) 4 MG tablet Take 4 mg by mouth every 8 (eight) hours as needed for nausea or vomiting.    .Glory RosebushVERIO test strip USE  STRIP TO CHECK GLUCOSE TWICE DAILY 100 each 1  . prednisoLONE acetate (PRED FORTE) 1 % ophthalmic suspension Place 1 drop into both eyes in the morning, at noon, in the evening, and at bedtime.    . torsemide (DEMADEX) 20 MG tablet Take 1 tablet (20 mg total) by mouth 2 (two) times daily. Take an extra 213mtablet daily prn in the morning for swelling or sob.    . traMADol (ULTRAM) 50 MG tablet One to Two Tabs Every Six Hours As Needed For Pain 40 tablet 0  . vitamin B-12 (CYANOCOBALAMIN) 1000 MCG tablet Take 1,000 mcg by mouth daily.     No current facility-administered medications on file prior to visit.    Allergies  Allergen Reactions  . Statins Shortness Of Breath and Other (See Comments)    Wheezing, short of breath  . Penicillins Hives and Other (See Comments)    Has patient had a PCN reaction causing immediate rash, facial/tongue/throat swelling, SOB or lightheadedness with hypotension: No Has patient had a PCN reaction causing severe rash involving mucus membranes or skin necrosis: No Has patient had a PCN reaction that required hospitalization No Has patient had a PCN reaction occurring within the  last 10 years: No If all of the above answers are "NO", then may proceed with Cephalosporin use.  . Marland Kitchenydralazine Itching  . Codeine Nausea Only  . Ibuprofen Other (See Comments)    Reaction:  Raises pts BP  . Percocet [Oxycodone-Acetaminophen] Nausea Only  . Tramadol Nausea Only and Other (See Comments)    Can take if she has eaten. Currently takes with good results   . Vicodin [Hydrocodone-Acetaminophen] Itching and Nausea Only    Family History  Problem Relation Age of Onset  . Breast cancer Sister [id number]. Diabetes Sister   . Diabetes Mother   . Hypertension Mother   . Hyperlipidemia Mother   . Eating disorder Mother   . Obesity Mother     BP 138/80   Pulse 77   Wt (!) 312 lb (141.5 kg)   SpO2 97%   BMI 47.44 kg/m    Review of Systems Denies LOC    Objective:   Physical Exam VITAL SIGNS:  See vs page GENERAL: no distress Pulses: right dorsalis pedis is intact. MSK: no deformity of the right foot CV: trace right leg edema.   Skin:  no ulcer on the right foot.  normal color and temp on the right foot.   Neuro: sensation is intact to touch on the right foot.   Ext: there is onychomycosis of the right foot toenails.  Left BKA.    Lab Results  Component Value Date   HGBA1C 7.7 (A) 05/04/2020   Lab Results  Component Value Date   CREATININE 3.25 (H) 05/28/2019   BUN 66 (H) 05/28/2019   NA 136 05/28/2019  K 4.3 05/28/2019   CL 103 05/28/2019   CO2 20 (L) 05/28/2019        Assessment & Plan:  Insulin-requiring type 2 DM, with PAD: worse Hypoglycemia, due to insulin: this limits aggressiveness of glycemic control She declines ref CDE for continuous glucose monitor.  Pt says she'll follow instructions.   Patient Instructions  please increase the insulin to 3 times a day (just before each meal) 15-17-18 units. I have sent a prescription to your pharmacy, for the continuous glucose monitor check your blood sugar 4 times a day.  vary the time of day when you  check, between before the 3 meals, and at bedtime.  also check if you have symptoms of your blood sugar being too high or too low.  please keep a record of the readings and bring it to your next appointment here (or you can bring the meter itself).  You can write it on any piece of paper.  please call us sooner if your blood sugar goes below 70, or if you have a lot of readings over 200. Please come back for a follow-up appointment in 2 months.

## 2020-05-22 ENCOUNTER — Other Ambulatory Visit: Payer: Self-pay | Admitting: Endocrinology

## 2020-05-22 DIAGNOSIS — IMO0002 Reserved for concepts with insufficient information to code with codable children: Secondary | ICD-10-CM

## 2020-05-22 DIAGNOSIS — E1165 Type 2 diabetes mellitus with hyperglycemia: Secondary | ICD-10-CM

## 2020-06-22 ENCOUNTER — Other Ambulatory Visit: Payer: Self-pay

## 2020-06-22 ENCOUNTER — Ambulatory Visit
Payer: Medicare Other | Attending: Student in an Organized Health Care Education/Training Program | Admitting: Student in an Organized Health Care Education/Training Program

## 2020-06-22 ENCOUNTER — Encounter: Payer: Self-pay | Admitting: Student in an Organized Health Care Education/Training Program

## 2020-06-22 VITALS — BP 131/83 | HR 73 | Temp 97.1°F | Ht 67.0 in | Wt 312.0 lb

## 2020-06-22 DIAGNOSIS — G8929 Other chronic pain: Secondary | ICD-10-CM | POA: Insufficient documentation

## 2020-06-22 DIAGNOSIS — Z6841 Body Mass Index (BMI) 40.0 and over, adult: Secondary | ICD-10-CM | POA: Diagnosis present

## 2020-06-22 DIAGNOSIS — N184 Chronic kidney disease, stage 4 (severe): Secondary | ICD-10-CM | POA: Diagnosis present

## 2020-06-22 DIAGNOSIS — I739 Peripheral vascular disease, unspecified: Secondary | ICD-10-CM | POA: Diagnosis present

## 2020-06-22 DIAGNOSIS — M25561 Pain in right knee: Secondary | ICD-10-CM

## 2020-06-22 DIAGNOSIS — Z89512 Acquired absence of left leg below knee: Secondary | ICD-10-CM | POA: Diagnosis not present

## 2020-06-22 DIAGNOSIS — G894 Chronic pain syndrome: Secondary | ICD-10-CM

## 2020-06-22 DIAGNOSIS — S88112A Complete traumatic amputation at level between knee and ankle, left lower leg, initial encounter: Secondary | ICD-10-CM | POA: Diagnosis present

## 2020-06-22 DIAGNOSIS — M1711 Unilateral primary osteoarthritis, right knee: Secondary | ICD-10-CM | POA: Diagnosis present

## 2020-06-22 MED ORDER — METHOCARBAMOL 750 MG PO TABS: 750.0000 mg | ORAL_TABLET | Freq: Two times a day (BID) | ORAL | 2 refills | Status: DC | PRN

## 2020-06-22 NOTE — Patient Instructions (Signed)
A prescription for Robaxin was sent to your pharmacy.

## 2020-06-22 NOTE — Progress Notes (Signed)
Safety precautions to be maintained throughout the outpatient stay will include: orient to surroundings, keep bed in low position, maintain call bell within reach at all times, provide assistance with transfer out of bed and ambulation.  

## 2020-06-22 NOTE — Progress Notes (Signed)
PROVIDER NOTE: Information contained herein reflects review and annotations entered in association with encounter. Interpretation of such information and data should be left to medically-trained personnel. Information provided to patient can be located elsewhere in the medical record under "Patient Instructions". Document created using STT-dictation technology, any transcriptional errors that may result from process are unintentional.    Patient: Rachel Holmes  Service Category: E/M  Provider: Gillis Santa, MD  DOB: 15-Dec-1960  DOS: 06/22/2020  Specialty: Interventional Pain Management  MRN: [id number]  Setting: Ambulatory outpatient  PCP: Sharyne Peach, MD  Type: Established Patient    Referring Provider: Sharyne Peach, MD  Location: Office  Delivery: Face-to-face     HPI  Reason for encounter: Rachel Holmes, a 59 y.o. year old female, is here today for evaluation and management of her Primary osteoarthritis of right knee [M17.11]. Rachel Holmes primary complain today is Knee Pain Last encounter: Practice (03/21/2020). My last encounter with her was on 02/07/2020. Pertinent problems: Rachel Holmes has Charcot's joint of left foot; Chronic pain of right knee; Uncontrolled type 2 diabetes mellitus with polyneuropathy ([place]); Primary osteoarthritis of right knee; Morbid obesity with BMI of 45.0-49.9, adult ([place]); Closed nondisplaced fracture of right patella; Hx of BKA, left ([place]); Diabetic polyneuropathy associated with type 2 diabetes mellitus ([place]); Chronic pain syndrome; and Below-knee amputation of left lower extremity (HCC) on their pertinent problem list. Pain Assessment: Severity of Chronic pain is reported as a 0-No pain/10. Location: Knee Left, Right/Denies. Onset: More than a month ago. Quality: Aching (knee making a cracking sound). Timing: Intermittent. Modifying factor(s): Meds and rest. Vitals:  height is _0  (1.702 m) and weight is 312 lb (141.5 kg) (abnormal). Her temperature  is 97.1 F (36.2 C) (abnormal). Her blood pressure is 131/83 and her pulse is 73. Her oxygen saturation is 99%.    Patient follows up today for medication management of Robaxin.  She is status post right knee genicular RFA on 02/07/2020 which is still providing her with significant pain relief.  She states that her right knee pain is much better since her radiofrequency ablation.  She does endorse a clicking sensation of her left stump superior to her prosthesis.  No pain, no redness, no drainage, no weakness with weightbearing.  We will continue to monitor.   ROS  Constitutional: Denies any fever or chills Gastrointestinal: No reported hemesis, hematochezia, vomiting, or acute GI distress Musculoskeletal: Very mild right knee pain Neurological: No reported episodes of acute onset apraxia, aphasia, dysarthria, agnosia, amnesia, paralysis, loss of coordination, or loss of consciousness  Medication Review  DULoxetine, Dulaglutide, Ensure Max Protein, FreeStyle Libre 14 Day Sensor, Insulin Pen Needle, ONE TOUCH ULTRA 2, albuterol, amLODipine, aspirin, cloNIDine, clobetasol cream, docusate sodium, ezetimibe, fluticasone, gabapentin, glucosamine-chondroitin, glucose blood, insulin lispro, isosorbide mononitrate, methocarbamol, multivitamin with minerals, ofloxacin, olmesartan, omeprazole, ondansetron, onetouch ultrasoft, prednisoLONE acetate, torsemide, traMADol, and vitamin B-12  History Review  Allergy: Rachel Holmes is allergic to statins, penicillins, hydralazine, codeine, ibuprofen, percocet [oxycodone-acetaminophen], tramadol, and vicodin [hydrocodone-acetaminophen]. Drug: Rachel Holmes  reports no history of drug use. Alcohol:  reports no history of alcohol use. Tobacco:  reports that she has never smoked. She has never used smokeless tobacco. Social: Rachel Holmes  reports that she has never smoked. She has never used smokeless tobacco. She reports that she does not drink alcohol and does not use  drugs. Medical:  has a past medical history of Allergic rhinitis, Allergy, Anemia, Anxiety, Arthritis, Back pain, Bradycardia, Breast mass,  CHF (congestive heart failure) ([place]), CKD (chronic kidney disease), Constipation, Diabetes mellitus without complication ([place]), Diabetic nephropathy ([place]), Dyspnea, GERD (gastroesophageal reflux disease), Heart murmur, HLD (hyperlipidemia), Hyperparathyroidism ([place]), Hypertension, Joint pain, Leg edema, Legally blind in left eye, as defined in [place], Lymphedema, Obesity, and Onychomycosis. Surgical: Rachel Holmes  has a past surgical history that includes Abdominal hysterectomy; TEE without cardioversion (N/A, 12/28/2018); IR Fluoro Guide CV Line Right (12/29/2018); IR US Guide Vasc Access Right (12/29/2018); IR Removal Tun Cv Cath W/O FL (02/19/2019); Vaginal hysterectomy; Breast biopsy (Left, 2014); Eye surgery (Left, 2007); and Amputation (Left, 05/05/2019). Family: family history includes Breast cancer (age of onset: 42) in her sister; Diabetes in her mother and sister; Eating disorder in her mother; Hyperlipidemia in her mother; Hypertension in her mother; Obesity in her mother.  Laboratory Chemistry Profile   Renal Lab Results  Component Value Date   BUN 66 (H) 05/28/2019   CREATININE 3.25 (H) 05/28/2019   BCR 17 06/25/2018   GFRAA 17 (L) 05/28/2019   GFRNONAA 15 (L) 05/28/2019     Hepatic Lab Results  Component Value Date   AST 27 05/28/2019   ALT 23 05/28/2019   ALBUMIN 4.0 05/28/2019   ALKPHOS 144 (H) 05/28/2019   LIPASE 103 11/19/2013     Electrolytes Lab Results  Component Value Date   NA 136 05/28/2019   K 4.3 05/28/2019   CL 103 05/28/2019   CALCIUM 8.8 (L) 05/28/2019   MG 2.4 05/12/2019   PHOS 4.5 09/09/2014     Bone Lab Results  Component Value Date   VD25OH 28.5 (L) 06/25/2018     Inflammation (CRP: Acute Phase) (ESR: Chronic Phase) Lab Results  Component Value Date   CRP 29.7 (H) 12/26/2018   ESRSEDRATE 102 (H) 12/26/2018    LATICACIDVEN 2.1 ([place]) 12/23/2018       Note: Above Lab results reviewed.  Recent Imaging Review  DG PAIN CLINIC C-ARM 1-60 MIN NO REPORT Fluoro was used, but no Radiologist interpretation will be provided.  Please refer to "NOTES" tab for provider progress note. Note: Reviewed        Physical Exam  General appearance: Well nourished, well developed, and well hydrated. In no apparent acute distress Mental status: Alert, oriented x 3 (person, place, & time)       Respiratory: No evidence of acute respiratory distress Eyes: PERLA Vitals: BP 131/83   Pulse 73   Temp (!) 97.1 F (36.2 C)   Ht _0  (1.702 m)   Wt (!) 312 lb (141.5 kg)   SpO2 99%   BMI 48.87 kg/m  BMI: Estimated body mass index is 48.87 kg/m as calculated from the following:   Height as of this encounter: _1  (1.702 m).   Weight as of this encounter: 312 lb (141.5 kg). Ideal: Ideal body weight: 61.6 kg (135 lb 12.9 oz) Adjusted ideal body weight: 93.6 kg (206 lb 4.5 oz)   Gait & Posture Assessment  Ambulation: Limited Gait: Limited. Using assistive device to ambulate Posture: Painful   Lower Extremity Exam    Side: Right lower extremity  Side: Left lower extremity  Stability: No instability observed          Stability: No instability observed          Skin & Extremity Inspection: Edema  Skin & Extremity Inspection: Below knee amputation (BKA).  Clicking sensation with flexion extension.  No redness, induration, drainage, warmth noted  Functional ROM: Decreased ROM for hip and knee joints  Functional ROM: Decreased ROM for hip joint          Muscle Tone/Strength: Functionally intact. No obvious neuro-muscular anomalies detected.  Muscle Tone/Strength: Deconditioned  Sensory (Neurological): Arthropathic arthralgia, improved since genicular nerve RFA        Sensory (Neurological): Neurogenic pain pattern        DTR: Patellar: 1+: trace Achilles: 0: absent Plantar: deferred today     Palpation: No palpable anomalies       Assessment   Status Diagnosis  Controlled Controlled Controlled 1. Primary osteoarthritis of right knee   2. Chronic pain of right knee   3. Chronic pain syndrome   4. Hx of BKA, left ([place])   5. PVD (peripheral vascular disease) (HCC)   6. Stage 4 chronic kidney disease ([place])   7. Morbid obesity with BMI of 45.0-49.9, adult ([place])   8. Below-knee amputation of left lower extremity ([place])      Updated Problems: Problem  Chronic Pain Syndrome  Hx of Bka, Left (Hcc)  Below-Knee Amputation of Left Lower Extremity (Hcc)  Diabetic Polyneuropathy Associated With Type 2 Diabetes Mellitus (Hcc)  Morbid Obesity With Bmi of 45.0-49.9, Adult (Hcc)  Primary Osteoarthritis of Right Knee   s/p R knee hyalgan 08/30/2019 and 09/13/2019, R knee steroid injection on 09/29/2019- not helpful,  R knee GNB- 11/08/19- 75% pain relief for 3-4 weeks,R knee RFA 02/07/20 helped significantly, repeat as needed   Uncontrolled Type 2 Diabetes Mellitus With Polyneuropathy (Hcc)  Chronic Pain of Right Knee  Charcot's Joint of Left Foot  Closed Nondisplaced Fracture of Right Patella    Plan of Care  Rachel Holmes has a current medication list which includes the following long-term medication(s): albuterol, clonidine, duloxetine, gabapentin, insulin lispro, isosorbide mononitrate, olmesartan, omeprazole, onetouch verio, and torsemide.  Pharmacotherapy (Medications Ordered): Meds ordered this encounter  Medications  . methocarbamol (ROBAXIN) 750 MG tablet    Sig: Take 1 tablet (750 mg total) by mouth every 12 (twelve) hours as needed for muscle spasms.    Dispense:  60 tablet    Refill:  2    Do not place this medication, or any other prescription from our practice, on "Automatic Refill". Patient may have prescription filled one day early if pharmacy is closed on scheduled refill date.   Orders:  Orders Placed This Encounter  Procedures  .  Radiofrequency,Genicular    For knee pain.    Standing Status:   Standing    Number of Occurrences:   1    Standing Expiration Date:   06/22/2021    Scheduling Instructions:     Side(s): RIGHT     Level(s): Superior-Lateral, Superior-Medial, and Inferior-Medial Genicular Nerve(s)     Sedation: Patient's choice.     TIMEFRAME: PRN procedure. (Rachel Holmes will call when needed.)    Order Specific Question:   Where will this procedure be performed?    Answer:   [facility]   Follow-up plan:   Return in about 3 months (around 09/22/2020) for Medication Management.     s/p R knee hyalgan 08/30/2019 and 09/13/2019, R knee steroid injection on 09/29/2019- not helpful,  R knee GNB- 11/08/19- 75% pain relief for 3-4 weeks,R knee RFA 02/07/20 helped significantly, repeat as needed        Recent Visits No visits were found meeting these conditions. Showing recent visits within past 90 days and meeting all other requirements Today's Visits Date Type Provider Dept  06/22/20 Office Visit Gillis Santa, MD  [facility]  Showing today's visits and meeting all other requirements Future Appointments Date Type Provider Dept  09/19/20 Appointment Gillis Santa, MD [facility]  Showing future appointments within next 90 days and meeting all other requirements  I discussed the assessment and treatment plan with the patient. The patient was provided an opportunity to ask questions and all were answered. The patient agreed with the plan and demonstrated an understanding of the instructions.  Patient advised to call back or seek an in-person evaluation if the symptoms or condition worsens.  Duration of encounter: 27mnutes.  Note by: BGillis Santa MD Date: 06/22/2020; Time: 12:22 PM

## 2020-07-11 ENCOUNTER — Ambulatory Visit: Payer: Medicare Other | Admitting: Cardiovascular Disease

## 2020-07-13 ENCOUNTER — Other Ambulatory Visit: Payer: Self-pay | Admitting: Endocrinology

## 2020-07-13 ENCOUNTER — Other Ambulatory Visit: Payer: Self-pay

## 2020-07-13 ENCOUNTER — Encounter: Payer: Self-pay | Admitting: Endocrinology

## 2020-07-13 ENCOUNTER — Telehealth: Payer: Self-pay | Admitting: Endocrinology

## 2020-07-13 ENCOUNTER — Ambulatory Visit (INDEPENDENT_AMBULATORY_CARE_PROVIDER_SITE_OTHER): Payer: Medicare Other | Admitting: Endocrinology

## 2020-07-13 VITALS — BP 114/68 | HR 80 | Ht 67.0 in | Wt 317.6 lb

## 2020-07-13 DIAGNOSIS — E1165 Type 2 diabetes mellitus with hyperglycemia: Secondary | ICD-10-CM

## 2020-07-13 DIAGNOSIS — IMO0002 Reserved for concepts with insufficient information to code with codable children: Secondary | ICD-10-CM

## 2020-07-13 DIAGNOSIS — E1142 Type 2 diabetes mellitus with diabetic polyneuropathy: Secondary | ICD-10-CM

## 2020-07-13 LAB — POCT GLYCOSYLATED HEMOGLOBIN (HGB A1C): Hemoglobin A1C: 7.5 % — AB (ref 4.0–5.6)

## 2020-07-13 MED ORDER — FREESTYLE LIBRE 14 DAY SENSOR MISC: 1.0000 | 3 refills | Status: DC

## 2020-07-13 MED ORDER — INSULIN LISPRO (1 UNIT DIAL) 100 UNIT/ML (KWIKPEN): PEN_INJECTOR | SUBCUTANEOUS | Status: DC

## 2020-07-13 NOTE — Patient Instructions (Addendum)
please increase the insulin to 3 times a day (just before each meal) 16-18-19 units.  I have sent a prescription to [facility], for the continuous glucose monitor check your blood sugar 4 times a day.  vary the time of day when you check, between before the 3 meals, and at bedtime.  also check if you have symptoms of your blood sugar being too high or too low.  please keep a record of the readings and bring it to your next appointment here (or you can bring the meter itself).  You can write it on any piece of paper.  please call us sooner if your blood sugar goes below 70, or if you have a lot of readings over 200. Please come back for a follow-up appointment in 3 months.

## 2020-07-13 NOTE — Telephone Encounter (Signed)
Patient called to advise that the pharmacy was not able to fill her [place] because it needs to have a PA. Wanted to let clinical staff know that a Pa should be coming from the pharmacy for sensors and receivers.  Patient was advised 7 to 10 business days for PA completion. Any questions [phone]

## 2020-07-13 NOTE — Progress Notes (Addendum)
Subjective:    Patient ID: Rachel Holmes, female    DOB: 1961/02/15, 59 y.o.   MRN: [id number]  HPI Pt returns for f/u of diabetes mellitus: DM type: Insulin-requiring type 2 Dx'ed: 1988, during a pregnancy, but it persisted after Complications: polyneuropathy, renal failure, leg ulcer, left BKA, PAD, and PDR.   Therapy: insulin since 6384, and trulicity DKA: never Severe hypoglycemia: 2 episodes (both many years ago).   Pancreatitis: never Pancreatic imaging: normal on 2008 [name].   Other: a trial off insulin in early 2020 was unsuccessful; renal failure limits rx options; she takes multiple daily injections; she does not need basal insulin; fructosamine corresponds to A1c approx 1.5% higher than A1c itself.   Interval history: no recent steroids.  pt states cbg's vary from 123-180.  It is in general higher as the day goes on.  She says she never misses the insulin. Pt requests continuous glucose monitor Past Medical History:  Diagnosis Date  . Allergic rhinitis   . Allergy   . Anemia   . Anxiety   . Arthritis   . Back pain   . Bradycardia   . Breast mass    Patient can no longer palpate specific masses but showed tech general area of concern  . CHF (congestive heart failure) ([place])   . CKD (chronic kidney disease)    STAGE 3  . Constipation   . Diabetes mellitus without complication ([place])   . Diabetic nephropathy ([place])   . Dyspnea   . GERD (gastroesophageal reflux disease)   . Heart murmur   . HLD (hyperlipidemia)   . Hyperparathyroidism ([place])   . Hypertension   . Joint pain   . Leg edema   . Legally blind in left eye, as defined in [place]   . Lymphedema   . Obesity   . Onychomycosis     Past Surgical History:  Procedure Laterality Date  . ABDOMINAL HYSTERECTOMY    . AMPUTATION Left 05/05/2019   Procedure: AMPUTATION BELOW KNEE;  Surgeon: Katha Cabal, MD;  Location: [facility] ORS;  Service: Vascular;  Laterality: Left;  . BREAST BIOPSY Left 2014   FNA 12:00  position - Negative  . EYE SURGERY Left 2007   removed a lens, no lens implanted  . IR FLUORO GUIDE CV LINE RIGHT  12/29/2018  . IR REMOVAL TUN CV CATH W/O FL  02/19/2019  . IR US GUIDE VASC ACCESS RIGHT  12/29/2018  . TEE WITHOUT CARDIOVERSION N/A 12/28/2018   Procedure: TRANSESOPHAGEAL ECHOCARDIOGRAM (TEE);  Surgeon: Sueanne Margarita, MD;  Location: [facility] ENDOSCOPY;  Service: Cardiovascular;  Laterality: N/A;  . VAGINAL HYSTERECTOMY     abdominal hyst, not a vaginal hyst    Social History   Socioeconomic History  . Marital status: Divorced    Spouse name: Not on file  . Number of children: 1  . Years of education: Not on file  . Highest education level: Not on file  Occupational History  . Occupation: Glass blower/designer    Comment: disability  Tobacco Use  . Smoking status: Never Smoker  . Smokeless tobacco: Never Used  Vaping Use  . Vaping Use: Never used  Substance and Sexual Activity  . Alcohol use: No  . Drug use: Never  . Sexual activity: Not on file  Other Topics Concern  . Not on file  Social History Narrative   ** Merged History Encounter **    on disability   Social Determinants of Health   Financial  Resource Strain:   . Difficulty of Paying Living Expenses: Not on file  Food Insecurity:   . Worried About Charity fundraiser in the Last Year: Not on file  . Ran Out of Food in the Last Year: Not on file  Transportation Needs:   . Lack of Transportation (Medical): Not on file  . Lack of Transportation (Non-Medical): Not on file  Physical Activity:   . Days of Exercise per Week: Not on file  . Minutes of Exercise per Session: Not on file  Stress:   . Feeling of Stress : Not on file  Social Connections:   . Frequency of Communication with Friends and Family: Not on file  . Frequency of Social Gatherings with Friends and Family: Not on file  . Attends Religious Services: Not on file  . Active Member of Clubs or Organizations: Not on file  . Attends Theatre manager Meetings: Not on file  . Marital Status: Not on file  Intimate Partner Violence:   . Fear of Current or Ex-Partner: Not on file  . Emotionally Abused: Not on file  . Physically Abused: Not on file  . Sexually Abused: Not on file    Current Outpatient Medications on File Prior to Visit  Medication Sig Dispense Refill  . albuterol (PROVENTIL HFA;VENTOLIN HFA) 108 (90 Base) MCG/ACT inhaler Inhale 1-2 puffs into the lungs every 6 (six) hours as needed for wheezing or shortness of breath.    [name] amLODipine (NORVASC) 10 MG tablet Take 10 mg by mouth daily.    [name] aspirin EC 81 MG EC tablet Take 1 tablet (81 mg total) by mouth daily.    . Blood Glucose Monitoring Suppl (ONE TOUCH ULTRA 2) w/Device KIT 1 Device by Does not apply route daily. (Patient taking differently: 1 Device by Does not apply route 3 (three) times daily. ) 1 each 0  . clobetasol cream (TEMOVATE) 4.33 % Apply 1 application topically 2 (two) times daily.    . cloNIDine (CATAPRES) 0.3 MG tablet Take 0.3 mg by mouth 2 (two) times daily.    [name] docusate sodium (COLACE) 100 MG capsule Take 1 capsule (100 mg total) by mouth 2 (two) times daily. 10 capsule 0  . Dulaglutide (TRULICITY) 3 IR/5.1OA SOPN Inject 3 mg into the skin once a week. 12 pen 3  . DULoxetine (CYMBALTA) 30 MG capsule Take 30 mg by mouth daily.     . Ensure Max Protein (ENSURE MAX PROTEIN) LIQD Take 330 mLs (11 oz total) by mouth 2 (two) times daily.    [name] ezetimibe (ZETIA) 10 MG tablet Take 10 mg by mouth daily.    . fluticasone (FLONASE) 50 MCG/ACT nasal spray Place 1 spray into both nostrils daily.     [name] gabapentin (NEURONTIN) 600 MG tablet Take 600 mg by mouth at bedtime.    [name] glucosamine-chondroitin 500-400 MG tablet Take 1 tablet by mouth 2 (two) times daily.    . Insulin Pen Needle (BD PEN NEEDLE MICRO U/F) 32G X 6 MM MISC 1 each by Does not apply route 3 (three) times daily. 100 each 3  . isosorbide mononitrate (IMDUR) 120 MG 24 hr tablet Take 1 tablet  (120 mg total) by mouth daily. (Patient taking differently: Take 120 mg by mouth in the morning and at bedtime. ) 180 tablet 3  . Lancets (ONETOUCH ULTRASOFT) lancets Used to check blood sugars four times daily. (Patient taking differently: 1 each by Other route 3 (three) times daily. )  200 each 12  . methocarbamol (ROBAXIN) 750 MG tablet Take 1 tablet (750 mg total) by mouth every 12 (twelve) hours as needed for muscle spasms. 60 tablet 2  . Multiple Vitamin (MULTIVITAMIN WITH MINERALS) TABS tablet Take 1 tablet by mouth daily.    [name] ofloxacin (OCUFLOX) 0.3 % ophthalmic solution Place 1 drop into both eyes in the morning, at noon, in the evening, and at bedtime.     [name] olmesartan (BENICAR) 20 MG tablet Take 40 mg by mouth daily. Pt is taking 82m daily    . omeprazole (PRILOSEC) 40 MG capsule Take 40 mg by mouth 2 (two) times daily.    . ondansetron (ZOFRAN) 4 MG tablet Take 4 mg by mouth every 8 (eight) hours as needed for nausea or vomiting.    .Glory RosebushVERIO test strip USE 1 STRIP TO CHECK GLUCOSE TWICE DAILY 100 each 0  . prednisoLONE acetate (PRED FORTE) 1 % ophthalmic suspension Place 1 drop into both eyes in the morning, at noon, in the evening, and at bedtime.     . torsemide (DEMADEX) 20 MG tablet Take 1 tablet (20 mg total) by mouth 2 (two) times daily. Take an extra 269mtablet daily prn in the morning for swelling or sob.    . traMADol (ULTRAM) 50 MG tablet One to Two Tabs Every Six Hours As Needed For Pain 40 tablet 0  . vitamin B-12 (CYANOCOBALAMIN) 1000 MCG tablet Take 1,000 mcg by mouth daily.     No current facility-administered medications on file prior to visit.    Allergies  Allergen Reactions  . Statins Shortness Of Breath and Other (See Comments)    Wheezing, short of breath  . Penicillins Hives and Other (See Comments)    Has patient had a PCN reaction causing immediate rash, facial/tongue/throat swelling, SOB or lightheadedness with hypotension: No Has patient had a  PCN reaction causing severe rash involving mucus membranes or skin necrosis: No Has patient had a PCN reaction that required hospitalization No Has patient had a PCN reaction occurring within the last 10 years: No If all of the above answers are "NO", then may proceed with Cephalosporin use.  . Marland Kitchenydralazine Itching  . Codeine Nausea Only  . Ibuprofen Other (See Comments)    Reaction:  Raises pts BP  . Percocet [Oxycodone-Acetaminophen] Nausea Only  . Tramadol Nausea Only and Other (See Comments)    Can take if she has eaten. Currently takes with good results   . Vicodin [Hydrocodone-Acetaminophen] Itching and Nausea Only    Family History  Problem Relation Age of Onset  . Breast cancer Sister [id number]. Diabetes Sister   . Diabetes Mother   . Hypertension Mother   . Hyperlipidemia Mother   . Eating disorder Mother   . Obesity Mother     BP 114/68   Pulse 80   Ht 5' 7"  (1.702 m)   Wt (!) 317 lb 9.6 oz (144.1 kg)   SpO2 94%   BMI 49.74 kg/m    Review of Systems She denies hypoglycemia    Objective:   Physical Exam VITAL SIGNS:  See vs page GENERAL: no distress.  In WC Pulses: right dorsalis pedis is intact. MSK: no deformity of the right foot CV: trace right leg edema.   Skin:  no ulcer on the right foot.  normal color and temp on the right foot.   Neuro: sensation is intact to touch on the right foot.   Ext: there is  onychomycosis of the right foot toenails.  Left BKA.    Lab Results  Component Value Date   HGBA1C 7.5 (A) 07/13/2020       Assessment & Plan:  Insulin-requiring type 2 DM, with PAD: uncontrolled  Patient Instructions  please increase the insulin to 3 times a day (just before each meal) 16-18-19 units.  I have sent a prescription to [facility], for the continuous glucose monitor check your blood sugar 4 times a day.  vary the time of day when you check, between before the 3 meals, and at bedtime.  also check if you have symptoms of your blood sugar being  too high or too low.  please keep a record of the readings and bring it to your next appointment here (or you can bring the meter itself).  You can write it on any piece of paper.  please call us sooner if your blood sugar goes below 70, or if you have a lot of readings over 200. Please come back for a follow-up appointment in 3 months.

## 2020-07-18 ENCOUNTER — Other Ambulatory Visit: Payer: Self-pay | Admitting: Endocrinology

## 2020-07-18 DIAGNOSIS — IMO0002 Reserved for concepts with insufficient information to code with codable children: Secondary | ICD-10-CM

## 2020-07-18 DIAGNOSIS — E1165 Type 2 diabetes mellitus with hyperglycemia: Secondary | ICD-10-CM

## 2020-07-25 ENCOUNTER — Ambulatory Visit (HOSPITAL_BASED_OUTPATIENT_CLINIC_OR_DEPARTMENT_OTHER): Payer: Medicare Other | Admitting: Student in an Organized Health Care Education/Training Program

## 2020-07-25 ENCOUNTER — Other Ambulatory Visit: Payer: Self-pay

## 2020-07-25 ENCOUNTER — Encounter: Payer: Self-pay | Admitting: Student in an Organized Health Care Education/Training Program

## 2020-07-25 ENCOUNTER — Ambulatory Visit
Admission: RE | Admit: 2020-07-25 | Discharge: 2020-07-25 | Disposition: A | Discharge status: Home / Self Care | Payer: Medicare Other | Source: Ambulatory Visit | Attending: Student in an Organized Health Care Education/Training Program | Admitting: Student in an Organized Health Care Education/Training Program

## 2020-07-25 VITALS — BP 135/77 | HR 67 | Temp 97.5°F | Resp 16 | Ht 66.0 in | Wt 300.0 lb

## 2020-07-25 DIAGNOSIS — Z6841 Body Mass Index (BMI) 40.0 and over, adult: Secondary | ICD-10-CM

## 2020-07-25 DIAGNOSIS — M67912 Unspecified disorder of synovium and tendon, left shoulder: Secondary | ICD-10-CM | POA: Insufficient documentation

## 2020-07-25 DIAGNOSIS — G894 Chronic pain syndrome: Secondary | ICD-10-CM

## 2020-07-25 DIAGNOSIS — M25512 Pain in left shoulder: Secondary | ICD-10-CM | POA: Insufficient documentation

## 2020-07-25 DIAGNOSIS — G8929 Other chronic pain: Secondary | ICD-10-CM

## 2020-07-25 DIAGNOSIS — I739 Peripheral vascular disease, unspecified: Secondary | ICD-10-CM

## 2020-07-25 DIAGNOSIS — M19012 Primary osteoarthritis, left shoulder: Secondary | ICD-10-CM

## 2020-07-25 NOTE — Patient Instructions (Signed)
____________________________________________________________________________________________  General Risks and Possible Complications  Patient Responsibilities: It is important that you read this as it is part of your informed consent. It is our duty to inform you of the risks and possible complications associated with treatments offered to you. It is your responsibility as a patient to read this and to ask questions about anything that is not clear or that you believe was not covered in this document.  Patient's Rights: You have the right to refuse treatment. You also have the right to change your mind, even after initially having agreed to have the treatment done. However, under this last option, if you wait until the last second to change your mind, you may be charged for the materials used up to that point.  Introduction: Medicine is not an exact science. Everything in Medicine, including the lack of treatment(s), carries the potential for danger, harm, or loss (which is by definition: Risk). In Medicine, a complication is a secondary problem, condition, or disease that can aggravate an already existing one. All treatments carry the risk of possible complications. The fact that a side effects or complications occurs, does not imply that the treatment was conducted incorrectly. It must be clearly understood that these can happen even when everything is done following the highest safety standards.  No treatment: You can choose not to proceed with the proposed treatment alternative. The "PRO(s)" would include: avoiding the risk of complications associated with the therapy. The "CON(s)" would include: not getting any of the treatment benefits. These benefits fall under one of three categories: diagnostic; therapeutic; and/or palliative. Diagnostic benefits include: getting information which can ultimately lead to improvement of the disease or symptom(s). Therapeutic benefits are those associated with the  successful treatment of the disease. Finally, palliative benefits are those related to the decrease of the primary symptoms, without necessarily curing the condition (example: decreasing the pain from a flare-up of a chronic condition, such as incurable terminal cancer).  General Risks and Complications: These are associated to most interventional treatments. They can occur alone, or in combination. They fall under one of the following six (6) categories: no benefit or worsening of symptoms; bleeding; infection; nerve damage; allergic reactions; and/or death. 1. No benefits or worsening of symptoms: In Medicine there are no guarantees, only probabilities. No healthcare provider can ever guarantee that a medical treatment will work, they can only state the probability that it may. Furthermore, there is always the possibility that the condition may worsen, either directly, or indirectly, as a consequence of the treatment. 2. Bleeding: This is more common if the patient is taking a blood thinner, either prescription or over the counter (example: Goody Powders, Fish oil, Aspirin, Garlic, etc.), or if suffering a condition associated with impaired coagulation (example: Hemophilia, cirrhosis of the liver, low platelet counts, etc.). However, even if you do not have one on these, it can still happen. If you have any of these conditions, or take one of these drugs, make sure to notify your treating physician. 3. Infection: This is more common in patients with a compromised immune system, either due to disease (example: diabetes, cancer, human immunodeficiency virus [HIV], etc.), or due to medications or treatments (example: therapies used to treat cancer and rheumatological diseases). However, even if you do not have one on these, it can still happen. If you have any of these conditions, or take one of these drugs, make sure to notify your treating physician. 4. Nerve Damage: This is more common when the   treatment is  an invasive one, but it can also happen with the use of medications, such as those used in the treatment of cancer. The damage can occur to small secondary nerves, or to large primary ones, such as those in the spinal cord and brain. This damage may be temporary or permanent and it may lead to impairments that can range from temporary numbness to permanent paralysis and/or brain death. 5. Allergic Reactions: Any time a substance or material comes in contact with our body, there is the possibility of an allergic reaction. These can range from a mild skin rash (contact dermatitis) to a severe systemic reaction (anaphylactic reaction), which can result in death. 6. Death: In general, any medical intervention can result in death, most of the time due to an unforeseen complication. ____________________________________________________________________________________________   

## 2020-07-25 NOTE — Progress Notes (Signed)
PROVIDER NOTE: Information contained herein reflects review and annotations entered in association with encounter. Interpretation of such information and data should be left to medically-trained personnel. Information provided to patient can be located elsewhere in the medical record under "Patient Instructions". Document created using STT-dictation technology, any transcriptional errors that may result from process are unintentional.    Patient: Rachel Holmes  Service Category: E/M  Provider: Gillis Santa, MD  DOB: November 27, 1960  DOS: 07/25/2020  Specialty: Interventional Pain Management  MRN: [id number]  Setting: Ambulatory outpatient  PCP: Sharyne Peach, MD  Type: Established Patient    Referring Provider: Sharyne Peach, MD  Location: Office  Delivery: Face-to-face     HPI  Reason for encounter: [name], a 59 y.o. year old female, is here today for evaluation and management of her Arthropathy of left shoulder [M19.012]. Rachel Holmes primary complain today is Shoulder Pain (left) and Knee Pain (right) Last encounter: Practice (06/22/2020). My last encounter with her was on 06/22/2020. Pertinent problems: Rachel Holmes has Charcot's joint of left foot; Chronic pain of right knee; Uncontrolled type 2 diabetes mellitus with polyneuropathy ([place]); Primary osteoarthritis of right knee; Morbid obesity with BMI of 45.0-49.9, adult ([place]); Closed nondisplaced fracture of right patella; Hx of BKA, left ([place]); Diabetic polyneuropathy associated with type 2 diabetes mellitus ([place]); Chronic left shoulder pain; and Below-knee amputation of left lower extremity (HCC) on their pertinent problem list. Pain Assessment: Severity of Chronic pain is reported as a 5 /10. Location: Shoulder Left/radiates into elbow. Onset: More than a month ago. Quality: Aching. Timing: Intermittent. Modifying factor(s): tylenol. Vitals:  height is 5' 6"  (1.676 m) and weight is 300 lb (136.1 kg). Her temporal temperature is  97.5 F (36.4 C) (abnormal). Her blood pressure is 135/77 and her pulse is 67. Her respiration is 16 and oxygen saturation is 100%.   Patient presents today complaining of swelling of her right thigh and right knee.  She states that this has been going on for the last month.  She denies right knee pain. Patient is also endorsing left shoulder pain that is worse with abduction.  This has been going on for the last month. Of note, patient is status post right genicular nerve radiofrequency ablation as well as a series of right intra-articular Hyalgan injections that have been helpful for her knee pain and range of motion.   ROS  Constitutional: Denies any fever or chills Gastrointestinal: No reported hemesis, hematochezia, vomiting, or acute GI distress Musculoskeletal: Right knee swelling, left shoulder pain  Neurological: No reported episodes of acute onset apraxia, aphasia, dysarthria, agnosia, amnesia, paralysis, loss of coordination, or loss of consciousness  Medication Review  DULoxetine, Dulaglutide, Ensure Max Protein, FreeStyle Libre 14 Day Reader, FreeStyle Libre 14 Day Sensor, Insulin Pen Needle, ONE TOUCH ULTRA 2, albuterol, amLODipine, aspirin, cloNIDine, clobetasol cream, docusate sodium, ezetimibe, fluticasone, gabapentin, glucosamine-chondroitin, glucose blood, insulin lispro, isosorbide mononitrate, methocarbamol, multivitamin with minerals, ofloxacin, olmesartan, omeprazole, ondansetron, onetouch ultrasoft, prednisoLONE acetate, torsemide, traMADol, and vitamin B-12  History Review  Allergy: Rachel Holmes is allergic to statins, penicillins, hydralazine, codeine, ibuprofen, percocet [oxycodone-acetaminophen], tramadol, and vicodin [hydrocodone-acetaminophen]. Drug: Rachel Holmes  reports no history of drug use. Alcohol:  reports no history of alcohol use. Tobacco:  reports that she has never smoked. She has never used smokeless tobacco. Social: Rachel Holmes  reports that she has  never smoked. She has never used smokeless tobacco. She reports that she does not drink alcohol and does not use  drugs. Medical:  has a past medical history of Allergic rhinitis, Allergy, Anemia, Anxiety, Arthritis, Back pain, Bradycardia, Breast mass, CHF (congestive heart failure) ([place]), CKD (chronic kidney disease), Constipation, Diabetes mellitus without complication ([place]), Diabetic nephropathy ([place]), Dyspnea, GERD (gastroesophageal reflux disease), Heart murmur, HLD (hyperlipidemia), Hyperparathyroidism ([place]), Hypertension, Joint pain, Leg edema, Legally blind in left eye, as defined in [place], Lymphedema, Obesity, and Onychomycosis. Surgical: Rachel Holmes  has a past surgical history that includes Abdominal hysterectomy; TEE without cardioversion (N/A, 12/28/2018); IR Fluoro Guide CV Line Right (12/29/2018); IR US Guide Vasc Access Right (12/29/2018); IR Removal Tun Cv Cath W/O FL (02/19/2019); Vaginal hysterectomy; Breast biopsy (Left, 2014); Eye surgery (Left, 2007); and Amputation (Left, 05/05/2019). Family: family history includes Breast cancer (age of onset: 81) in her sister; Diabetes in her mother and sister; Eating disorder in her mother; Hyperlipidemia in her mother; Hypertension in her mother; Obesity in her mother.  Laboratory Chemistry Profile   Renal Lab Results  Component Value Date   BUN 66 (H) 05/28/2019   CREATININE 3.25 (H) 05/28/2019   BCR 17 06/25/2018   GFRAA 17 (L) 05/28/2019   GFRNONAA 15 (L) 05/28/2019     Hepatic Lab Results  Component Value Date   AST 27 05/28/2019   ALT 23 05/28/2019   ALBUMIN 4.0 05/28/2019   ALKPHOS 144 (H) 05/28/2019   LIPASE 103 11/19/2013     Electrolytes Lab Results  Component Value Date   NA 136 05/28/2019   K 4.3 05/28/2019   CL 103 05/28/2019   CALCIUM 8.8 (L) 05/28/2019   MG 2.4 05/12/2019   PHOS 4.5 09/09/2014     Bone Lab Results  Component Value Date   VD25OH 28.5 (L) 06/25/2018     Inflammation (CRP: Acute Phase) (ESR:  Chronic Phase) Lab Results  Component Value Date   CRP 29.7 (H) 12/26/2018   ESRSEDRATE 102 (H) 12/26/2018   LATICACIDVEN 2.1 ([place]) 12/23/2018       Note: Above Lab results reviewed.  Recent Imaging Review  DG PAIN CLINIC C-ARM 1-60 MIN NO REPORT Fluoro was used, but no Radiologist interpretation will be provided.  Please refer to "NOTES" tab for provider progress note. Note: Reviewed        Physical Exam  General appearance: Well nourished, well developed, and well hydrated. In no apparent acute distress Mental status: Alert, oriented x 3 (person, place, & time)       Respiratory: No evidence of acute respiratory distress Eyes: PERLA Vitals: BP 135/77 (BP Location: Right Arm, Patient Position: Sitting, Cuff Size: Large)   Pulse 67   Temp (!) 97.5 F (36.4 C) (Temporal)   Resp 16   Ht 5' 6"  (1.676 m)   Wt 300 lb (136.1 kg)   SpO2 100%   BMI 48.42 kg/m  BMI: Estimated body mass index is 48.42 kg/m as calculated from the following:   Height as of this encounter: 5' 6"  (1.676 m).   Weight as of this encounter: 300 lb (136.1 kg). Ideal: Ideal body weight: 59.3 kg (130 lb 11.7 oz) Adjusted ideal body weight: 90 kg (198 lb 7 oz)  Upper Extremity (UE) Exam    Side: Right upper extremity  Side: Left upper extremity  Skin & Extremity Inspection: Skin color, temperature, and hair growth are WNL. No peripheral edema or cyanosis. No masses, redness, swelling, asymmetry, or associated skin lesions. No contractures.  Skin & Extremity Inspection: Skin color, temperature, and hair growth are WNL. No peripheral edema or cyanosis. No  masses, redness, swelling, asymmetry, or associated skin lesions. No contractures.  Functional ROM: Unrestricted ROM          Functional ROM: Pain restricted ROM for shoulder  Muscle Tone/Strength: Functionally intact. No obvious neuro-muscular anomalies detected.  Muscle Tone/Strength: Functionally intact. No obvious neuro-muscular anomalies detected.  Sensory  (Neurological): Unimpaired          Sensory (Neurological): Musculoskeletal pain pattern          Palpation: No palpable anomalies              Palpation: No palpable anomalies              Provocative Test(s):  Phalen's test: deferred Tinel's test: deferred Apley's scratch test (touch opposite shoulder):  Action 1 (Across chest): deferred Action 2 (Overhead): deferred Action 3 (LB reach): deferred   Provocative Test(s):  Phalen's test: deferred Tinel's test: deferred Apley's scratch test (touch opposite shoulder):  Action 1 (Across chest): Decreased ROM Action 2 (Overhead): Decreased ROM Action 3 (LB reach): Decreased ROM     Gait & Posture Assessment  Ambulation:Limited Gait:Limited. Using assistive device to ambulate Posture:Painful  Lower Extremity Exam    Side:Right lower extremity  Side:Left lower extremity  Stability:No instability observed  Stability:No instability observed  Skin & Extremity Inspection:Edema  Skin & Extremity Inspection:Below knee amputation (BKA).  Clicking sensation with flexion extension.  No redness, induration, drainage, warmth noted  Functional IRJ:JOACZYSAY ROMfor hip and knee joints   Functional TKZ:SWFUXNATF ROMfor hip joint   Muscle Tone/Strength:Functionally intact. No obvious neuro-muscular anomalies detected.  Muscle Tone/Strength:Deconditioned  Sensory (Neurological):Arthropathic arthralgia, improved since genicular nerve RFA  Sensory (Neurological):Neurogenic pain pattern  DTR: Patellar:1+: trace Achilles:0: absent Plantar:deferred today    Palpation:No palpable anomalies       Assessment   Status Diagnosis  Deteriorating Having a Flare-up Having a Flare-up 1. Arthropathy of left shoulder   2. Dysfunction of left rotator cuff   3. Chronic left shoulder pain   4. Morbid obesity with BMI of 45.0-49.9, adult ([place])   5. PVD (peripheral vascular disease)  ([place])   6. Chronic pain syndrome      Updated Problems: Problem  Chronic Left Shoulder Pain  Arthropathy of Left Shoulder  Dysfunction of Left Rotator Cuff    Plan of Care   Ms. DALLANA MAVITY has a current medication list which includes the following long-term medication(s): albuterol, clonidine, duloxetine, gabapentin, insulin lispro, isosorbide mononitrate, olmesartan, omeprazole, onetouch verio, and torsemide.   Right knee edema: I believe this is likely related to patient's cardiovascular disease and overall volume status.  I have instructed her to follow-up with her primary care provider and/or cardiologist to discuss DVT work-up and or adjusting her diuretic.  In regards to her right knee pain, she is doing well from that standpoint after her genicular nerve radiofrequency ablation that was done on 02/07/2020.  Patient is endorsing left shoulder pain that is worse with abduction.  She has limited range of motion related to pain.  Will obtain left shoulder x-ray and discussed diagnostic left shoulder intra-articular steroid injection, posterior approach under fluoroscopy.  Risks and benefits reviewed and patient would like to proceed.  Orders:  Orders Placed This Encounter  Procedures  . SHOULDER INJECTION    Standing Status:   Future    Standing Expiration Date:   10/24/2020    Scheduling Instructions:     Side: left     Sedation: without     Timeframe: As soon as schedule  allows    Order Specific Question:   Where will this procedure be performed?    Answer:   [facility]    Comments:   [place]  . DG Shoulder Left    Standing Status:   Future    Standing Expiration Date:   01/22/2021    Order Specific Question:   Reason for Exam (SYMPTOM  OR DIAGNOSIS REQUIRED)    Answer:   Left shoulder pain    Order Specific Question:   Is the patient pregnant?    Answer:   No    Order Specific Question:   Preferred imaging location?    Answer:   [facility]    Order  Specific Question:   Call Results- Best Contact Number?    Answer:   [phone]   Follow-up plan:   Return in about 1 week (around 08/01/2020) for left shoulder injection (posterior)  , without sedation.     s/p R knee hyalgan 08/30/2019 and 09/13/2019, R knee steroid injection on 09/29/2019- not helpful,  R knee GNB- 11/08/19- 75% pain relief for 3-4 weeks,R knee RFA 02/07/20 helped significantly, repeat as needed, plan for left shoulder injection         Recent Visits Date Type Provider Dept  06/22/20 Office Visit Gillis Santa, MD [facility]  Showing recent visits within past 90 days and meeting all other requirements Today's Visits Date Type Provider Dept  07/25/20 Office Visit Gillis Santa, MD [facility]  Showing today's visits and meeting all other requirements Future Appointments Date Type Provider Dept  08/02/20 Appointment Gillis Santa, MD [facility]  09/19/20 Appointment Gillis Santa, MD [facility]  Showing future appointments within next 90 days and meeting all other requirements  I discussed the assessment and treatment plan with the patient. The patient was provided an opportunity to ask questions and all were answered. The patient agreed with the plan and demonstrated an understanding of the instructions.  Patient advised to call back or seek an in-person evaluation if the symptoms or condition worsens.  Duration of encounter: 30 minutes.  Note by: Gillis Santa, MD Date: 07/25/2020; Time: 3:03 PM

## 2020-07-25 NOTE — Progress Notes (Signed)
Safety precautions to be maintained throughout the outpatient stay will include: orient to surroundings, keep bed in low position, maintain call bell within reach at all times, provide assistance with transfer out of bed and ambulation.  

## 2020-07-27 NOTE — Telephone Encounter (Signed)
[employer] rep Johnna @ [facility] called to state they faxed certificate of medical necessity and request for office note on 07/24/20. PA for transmitter & receiver.

## 2020-08-02 ENCOUNTER — Other Ambulatory Visit: Payer: Self-pay

## 2020-08-02 ENCOUNTER — Ambulatory Visit (HOSPITAL_BASED_OUTPATIENT_CLINIC_OR_DEPARTMENT_OTHER): Payer: Medicare Other | Admitting: Student in an Organized Health Care Education/Training Program

## 2020-08-02 ENCOUNTER — Ambulatory Visit
Admission: RE | Admit: 2020-08-02 | Discharge: 2020-08-02 | Disposition: A | Discharge status: Home / Self Care | Payer: Medicare Other | Source: Ambulatory Visit | Attending: Student in an Organized Health Care Education/Training Program | Admitting: Student in an Organized Health Care Education/Training Program

## 2020-08-02 ENCOUNTER — Encounter: Payer: Self-pay | Admitting: Student in an Organized Health Care Education/Training Program

## 2020-08-02 VITALS — BP 163/101 | HR 78 | Temp 97.2°F | Resp 14 | Ht 68.0 in | Wt 300.0 lb

## 2020-08-02 DIAGNOSIS — M25512 Pain in left shoulder: Secondary | ICD-10-CM | POA: Diagnosis not present

## 2020-08-02 DIAGNOSIS — M19012 Primary osteoarthritis, left shoulder: Secondary | ICD-10-CM | POA: Insufficient documentation

## 2020-08-02 DIAGNOSIS — M67912 Unspecified disorder of synovium and tendon, left shoulder: Secondary | ICD-10-CM

## 2020-08-02 DIAGNOSIS — G8929 Other chronic pain: Secondary | ICD-10-CM | POA: Insufficient documentation

## 2020-08-02 MED ORDER — IOHEXOL 180 MG/ML  SOLN
10.0000 mL | Freq: Once | INTRAMUSCULAR | Status: AC
  Administered 2020-08-02: 10 mL via INTRA_ARTICULAR

## 2020-08-02 MED ORDER — ROPIVACAINE HCL 2 MG/ML IJ SOLN
4.0000 mL | Freq: Once | INTRAMUSCULAR | Status: AC
  Administered 2020-08-02: 4 mL via INTRA_ARTICULAR

## 2020-08-02 MED ORDER — ROPIVACAINE HCL 2 MG/ML IJ SOLN
INTRAMUSCULAR | Status: AC
  Filled 2020-08-02: qty 10

## 2020-08-02 MED ORDER — DEXAMETHASONE SODIUM PHOSPHATE 10 MG/ML IJ SOLN
INTRAMUSCULAR | Status: AC
  Filled 2020-08-02: qty 1

## 2020-08-02 MED ORDER — LIDOCAINE HCL 2 % IJ SOLN
INTRAMUSCULAR | Status: AC
  Filled 2020-08-02: qty 20

## 2020-08-02 MED ORDER — LIDOCAINE HCL 2 % IJ SOLN
20.0000 mL | Freq: Once | INTRAMUSCULAR | Status: AC
  Administered 2020-08-02: 400 mg

## 2020-08-02 MED ORDER — METHYLPREDNISOLONE ACETATE 40 MG/ML IJ SUSP
40.0000 mg | Freq: Once | INTRAMUSCULAR | Status: AC
  Administered 2020-08-02: 40 mg via INTRA_ARTICULAR
  Filled 2020-08-02: qty 1

## 2020-08-02 NOTE — Progress Notes (Signed)
Safety precautions to be maintained throughout the outpatient stay will include: orient to surroundings, keep bed in low position, maintain call bell within reach at all times, provide assistance with transfer out of bed and ambulation.  

## 2020-08-02 NOTE — Progress Notes (Signed)
PROVIDER NOTE: Information contained herein reflects review and annotations entered in association with encounter. Interpretation of such information and data should be left to medically-trained personnel. Information provided to patient can be located elsewhere in the medical record under "Patient Instructions". Document created using STT-dictation technology, any transcriptional errors that may result from process are unintentional.    Patient: Rachel Holmes  Service Category: Procedure  Provider: Gillis Santa, MD  DOB: Aug 01, 1961  DOS: 08/02/2020  Location: [facility]  MRN: [id number]  Setting: Ambulatory - outpatient  Referring Provider: Sharyne Peach, MD  Type: Established Patient  Specialty: Interventional Pain Management  PCP: Sharyne Peach, MD   Primary Reason for Visit: Interventional Pain Management Treatment. CC: Shoulder Pain (left)  Procedure:          Anesthesia, Analgesia, Anxiolysis:  Type: Diagnostic Glenohumeral Joint (shoulder) Injection #1  Primary Purpose: Diagnostic Region: Posterior Shoulder Area Level:  Shoulder Target Area: Glenohumeral Joint (shoulder) Approach: Posterior approach. Laterality: Left-Sided  Type: Local Anesthesia  Local Anesthetic: Lidocaine 1-2%  Position: Prone   Indications: 1. Arthropathy of left shoulder   2. Dysfunction of left rotator cuff   3. Chronic left shoulder pain    Pain Score: Pre-procedure:4/10 Post-procedure: 0-No pain/10   Pre-op Assessment:  Rachel Holmes is a 59 y.o. (year old), female patient, seen today for interventional treatment. She  has a past surgical history that includes Abdominal hysterectomy; TEE without cardioversion (N/A, 12/28/2018); IR Fluoro Guide CV Line Right (12/29/2018); IR US Guide Vasc Access Right (12/29/2018); IR Removal Tun Cv Cath W/O FL (02/19/2019); Vaginal hysterectomy; Breast biopsy (Left, 2014); Eye surgery (Left, 2007); and Amputation (Left, 05/05/2019). Ms. Colvin has a  current medication list which includes the following prescription(s): albuterol, amlodipine, aspirin, one touch ultra 2, clobetasol cream, clonidine, freestyle libre 14 day reader, freestyle libre 14 day sensor, docusate sodium, trulicity, duloxetine, ensure max protein, ezetimibe, fluticasone, gabapentin, glucosamine-chondroitin, insulin lispro, insulin pen needle, isosorbide mononitrate, onetouch ultrasoft, methocarbamol, multivitamin with minerals, ofloxacin, olmesartan, omeprazole, ondansetron, onetouch verio, prednisolone acetate, torsemide, tramadol, and vitamin b-12. Her primarily concern today is the Shoulder Pain (left)  Initial Vital Signs:  Pulse/HCG Rate: 78ECG Heart Rate: 78 Temp: (!) 97.2 F (36.2 C) Resp: 18 BP: (!) 158/66 SpO2: 100 %  BMI: Estimated body mass index is 45.61 kg/m as calculated from the following:   Height as of this encounter: 5\' 8"  (1.727 m).   Weight as of this encounter: 300 lb (136.1 kg).  Risk Assessment: Allergies: Reviewed. She is allergic to statins, penicillins, hydralazine, codeine, ibuprofen, percocet [oxycodone-acetaminophen], tramadol, and vicodin [hydrocodone-acetaminophen].  Allergy Precautions: None required Coagulopathies: Reviewed. None identified.  Blood-thinner therapy: None at this time Active Infection(s): Reviewed. None identified. [name] is afebrile  Site Confirmation: [name] was asked to confirm the procedure and laterality before marking the site Procedure checklist: Completed Consent: Before the procedure and under the influence of no sedative(s), amnesic(s), or anxiolytics, the patient was informed of the treatment options, risks and possible complications. To fulfill our ethical and legal obligations, as recommended by the American Medical Association's Code of Ethics, I have informed the patient of my clinical impression; the nature and purpose of the treatment or procedure; the risks, benefits, and possible  complications of the intervention; the alternatives, including doing nothing; the risk(s) and benefit(s) of the alternative treatment(s) or procedure(s); and the risk(s) and benefit(s) of doing nothing. The patient was provided information about the general risks and possible complications associated with  the procedure. These may include, but are not limited to: failure to achieve desired goals, infection, bleeding, organ or nerve damage, allergic reactions, paralysis, and death. In addition, the patient was informed of those risks and complications associated to the procedure, such as failure to decrease pain; infection; bleeding; organ or nerve damage with subsequent damage to sensory, motor, and/or autonomic systems, resulting in permanent pain, numbness, and/or weakness of one or several areas of the body; allergic reactions; (i.e.: anaphylactic reaction); and/or death. Furthermore, the patient was informed of those risks and complications associated with the medications. These include, but are not limited to: allergic reactions (i.e.: anaphylactic or anaphylactoid reaction(s)); adrenal axis suppression; blood sugar elevation that in diabetics may result in ketoacidosis or comma; water retention that in patients with history of congestive heart failure may result in shortness of breath, pulmonary edema, and decompensation with resultant heart failure; weight gain; swelling or edema; medication-induced neural toxicity; particulate matter embolism and blood vessel occlusion with resultant organ, and/or nervous system infarction; and/or aseptic necrosis of one or more joints. Finally, the patient was informed that Medicine is not an exact science; therefore, there is also the possibility of unforeseen or unpredictable risks and/or possible complications that may result in a catastrophic outcome. The patient indicated having understood very clearly. We have given the patient no guarantees and we have made no  promises. Enough time was given to the patient to ask questions, all of which were answered to the patient's satisfaction. [name] has indicated that she wanted to continue with the procedure. Attestation: I, the ordering provider, attest that I have discussed with the patient the benefits, risks, side-effects, alternatives, likelihood of achieving goals, and potential problems during recovery for the procedure that I have provided informed consent. Date  Time: 08/02/2020 10:52 AM  Pre-Procedure Preparation:  Monitoring: As per clinic protocol. Respiration, ETCO2, SpO2, BP, heart rate and rhythm monitor placed and checked for adequate function Safety Precautions: Patient was assessed for positional comfort and pressure points before starting the procedure. Time-out: I initiated and conducted the "Time-out" before starting the procedure, as per protocol. The patient was asked to participate by confirming the accuracy of the "Time Out" information. Verification of the correct person, site, and procedure were performed and confirmed by me, the nursing staff, and the patient. "Time-out" conducted as per Joint Commission's Universal Protocol (UP.01.01.01). Time: 1127  Description of Procedure:          Area Prepped: Entire shoulder Area DuraPrep (Iodine Povacrylex [0.7% available iodine] and Isopropyl Alcohol, 74% w/w) Safety Precautions: Aspiration looking for blood return was conducted prior to all injections. At no point did we inject any substances, as a needle was being advanced. No attempts were made at seeking any paresthesias. Safe injection practices and needle disposal techniques used. Medications properly checked for expiration dates. SDV (single dose vial) medications used. Description of the Procedure: Protocol guidelines were followed. The patient was placed in position over the procedure table. The target area was identified and the area prepped in the usual manner. Skin & deeper tissues  infiltrated with local anesthetic. Appropriate amount of time allowed to pass for local anesthetics to take effect. The procedure needles were then advanced to the target area. Proper needle placement secured. Negative aspiration confirmed. Solution injected in intermittent fashion, asking for systemic symptoms every 0.5cc of injectate. The needles were then removed and the area cleansed, making sure to leave some of the prepping solution back to take advantage of its long  term bactericidal properties.         Vitals:   08/02/20 1122 08/02/20 1127 08/02/20 1131 08/02/20 1132  BP: (!) 167/109 (!) 159/97 (!) 173/99 (!) 163/101  Pulse:      Resp: 14 12 12 14   Temp:      TempSrc:      SpO2: 98% 99% 100% 99%  Weight:      Height:        Start Time: 1127 hrs. End Time: 1132 hrs. Materials:  Needle(s) Type: Spinal Needle Gauge: 22G Length: 3.5-in Medication(s): Please see orders for medications and dosing details. 5 cc solution made of 4 cc of 0.2% ropivacaine, 1 cc of methylprednisolone, 40 mg/cc. Imaging Guidance (Non-Spinal):          Type of Imaging Technique: Fluoroscopy Guidance (Non-Spinal) Indication(s): Assistance in needle guidance and placement for procedures requiring needle placement in or near specific anatomical locations not easily accessible without such assistance. Exposure Time: Please see nurses notes. Contrast: Before injecting any contrast, we confirmed that the patient did not have an allergy to iodine, shellfish, or radiological contrast. Once satisfactory needle placement was completed at the desired level, radiological contrast was injected. Contrast injected under live fluoroscopy. No contrast complications. See chart for type and volume of contrast used. Fluoroscopic Guidance: I was personally present during the use of fluoroscopy. "Tunnel Vision Technique" used to obtain the best possible view of the target area. Parallax error corrected before commencing the  procedure. "Direction-depth-direction" technique used to introduce the needle under continuous pulsed fluoroscopy. Once target was reached, antero-posterior, oblique, and lateral fluoroscopic projection used confirm needle placement in all planes. Images permanently stored in EMR. Interpretation: I personally interpreted the imaging intraoperatively. Adequate needle placement confirmed in multiple planes. Appropriate spread of contrast into desired area was observed. No evidence of afferent or efferent intravascular uptake. Permanent images saved into the patient's record.  Antibiotic Prophylaxis:   Anti-infectives (From admission, onward)   None     Indication(s): None identified  Post-operative Assessment:  Post-procedure Vital Signs:  Pulse/HCG Rate: 7877 Temp: (!) 97.2 F (36.2 C) Resp: 14 BP: (!) 163/101 SpO2: 99 %  EBL: None  Complications: No immediate post-treatment complications observed by team, or reported by patient.  Note: The patient tolerated the entire procedure well. A repeat set of vitals were taken after the procedure and the patient was kept under observation following institutional policy, for this type of procedure. Post-procedural neurological assessment was performed, showing return to baseline, prior to discharge. The patient was provided with post-procedure discharge instructions, including a section on how to identify potential problems. Should any problems arise concerning this procedure, the patient was given instructions to immediately contact us, at any time, without hesitation. In any case, we plan to contact the patient by telephone for a follow-up status report regarding this interventional procedure.  Comments:  No additional relevant information.  Plan of Care  Orders:  Orders Placed This Encounter  Procedures  . DG [facility] C-ARM 1-60 MIN NO REPORT    Intraoperative interpretation by procedural physician at [place].    Standing  Status:   Standing    Number of Occurrences:   1    Order Specific Question:   Reason for exam:    Answer:   Assistance in needle guidance and placement for procedures requiring needle placement in or near specific anatomical locations not easily accessible without such assistance.   Medications ordered for procedure: Meds ordered this encounter  Medications  . iohexol (OMNIPAQUE) 180 MG/ML injection 10 mL    Must be Myelogram-compatible. If not available, you may substitute with a water-soluble, non-ionic, hypoallergenic, myelogram-compatible radiological contrast medium.  [name] lidocaine (XYLOCAINE) 2 % (with pres) injection 400 mg  . ropivacaine (PF) 2 mg/mL (0.2%) (NAROPIN) injection 4 mL  . methylPREDNISolone acetate (DEPO-MEDROL) injection 40 mg   Medications administered: We administered iohexol, lidocaine, ropivacaine (PF) 2 mg/mL (0.2%), and methylPREDNISolone acetate.  See the medical record for exact dosing, route, and time of administration.  Follow-up plan:   Return for [facility] appt.      s/p R knee hyalgan 08/30/2019 and 09/13/2019, R knee steroid injection on 09/29/2019- not helpful,  R knee GNB- 11/08/19- 75% pain relief for 3-4 weeks,R knee RFA 02/07/20 helped significantly, repeat as needed, 08/02/2020: Left shoulder injection, posterior approach          Recent Visits Date Type Provider Dept  07/25/20 Office Visit Gillis Santa, MD [facility]  06/22/20 Office Visit Gillis Santa, MD [facility]  Showing recent visits within past 90 days and meeting all other requirements Today's Visits Date Type Provider Dept  08/02/20 Procedure visit Gillis Santa, MD [facility]  Showing today's visits and meeting all other requirements Future Appointments Date Type Provider Dept  09/19/20 Appointment Gillis Santa, MD [facility]  Showing future appointments within next 90 days and meeting all other requirements  Disposition: Discharge home    Discharge (Date  Time): 08/02/2020; 1137 hrs.   Primary Care Physician: Sharyne Peach, MD Location: [facility] Note by: Gillis Santa, MD Date: 08/02/2020; Time: 11:39 AM  Disclaimer:  Medicine is not an exact science. The only guarantee in medicine is that nothing is guaranteed. It is important to note that the decision to proceed with this intervention was based on the information collected from the patient. The Data and conclusions were drawn from the patient's questionnaire, the interview, and the physical examination. Because the information was provided in large part by the patient, it cannot be guaranteed that it has not been purposely or unconsciously manipulated. Every effort has been made to obtain as much relevant data as possible for this evaluation. It is important to note that the conclusions that lead to this procedure are derived in large part from the available data. Always take into account that the treatment will also be dependent on availability of resources and existing treatment guidelines, considered by other Pain Management Practitioners as being common knowledge and practice, at the time of the intervention. For Medico-Legal purposes, it is also important to point out that variation in procedural techniques and pharmacological choices are the acceptable norm. The indications, contraindications, technique, and results of the above procedure should only be interpreted and judged by a Board-Certified Interventional Pain Specialist with extensive familiarity and expertise in the same exact procedure and technique.

## 2020-08-03 ENCOUNTER — Telehealth: Payer: Self-pay

## 2020-08-03 NOTE — Telephone Encounter (Signed)
Post procedure phone call.  Unable to get through to patient.

## 2020-08-04 NOTE — Telephone Encounter (Signed)
Attemped to follow up on paperwork but no answer when called.  If we have received the paperwork it has been processed.  If they have not received it they need to refax it.

## 2020-08-04 NOTE — Telephone Encounter (Signed)
Rachel Holmes with [facility] called again to follow up on the certificate of medical necessity fax.

## 2020-08-05 DIAGNOSIS — R262 Difficulty in walking, not elsewhere classified: Secondary | ICD-10-CM | POA: Insufficient documentation

## 2020-08-29 NOTE — Progress Notes (Signed)
Evaluation Performed:  Follow-up visit  Date:  08/30/2020   ID:  Rachel Holmes, DOB 06-Jun-1961, MRN [id number]  Patient Location:  [address] [place] [address]   Provider location:   [facility], [place] office  PCP:  Sharyne Peach, MD  Cardiologist:  Arvid Right [facility]  Chief Complaint  Patient presents with  . Follow-up    6 months  Pt states no new Sx.    History of Present Illness:    Rachel Holmes is a 59 y.o. female past medical history of Hypertension;  Anemia of chronic disease;  Obesity, unspecified;  CHF (congestive heart failure) (CMS-HCC);  GERD (gastroesophageal reflux disease);  Bradycardia;  Anxiety,  Hyperparathyroidism (CMS-HCC); Increased PTH level;  CKD (chronic kidney disease) stage 3, GFR 30-59 ml/min;   Type 2 diabetes mellitus with stage 3 chronic kidney disease (CMS-HCC);   Lymphedema   Charcot's joint of left foot  profound Charcot foot deformity in association with this large ulcer Skin ulcer of left ankle with necrosis of bone (HCC)  knee amputation on the left group G streptococcal bacteremia with aortic valve endocarditis  Who presents for follow-up of her chronic diastolic CHF  Recent lab work reviewed with her HBA1C 7.5 CR 3.22, BUN 55  Weight stable compared to 01/2020 reports fluid retention from amlodipine Tried to come off, could not tolerate hydralazine (malaise) Went back on the amlodipine, swelling right lower extremity has persisted  On torsemide 20 BID On olmesartan 40 mg once daily,  isosorbide 120 mg taking 1 tablet by mouth twice daily, clonidine 0.3 mg twice daily, amlodipine 10 mg once daily.   Followed by nephrology   Lives with exhusband He smokes  No regular exercise program Weight continues to run high Gait instability  EKG personally reviewed by myself on todays visit Shows normal sinus rhythm with rate 65 bpm APC, no significant ST-T wave changes  In hospital  12/2018 Sepsis secondary to group G streptococcal bacteremia with aortic valve endocarditis and left leg soft tissue infection: Both TTE and TEE confirmed aortic valve endocarditis. Acute hypoxic respiratory failure likely secondary to aspiration pneumonia and decompensated diastolic heart failure: Occurred after extubation post TEE on 2/24, required BiPAP briefly. --done with ABX  [facility]  2017 stress test done Prior cardiac catheterization.     Prior CV studies:   The following studies were reviewed today:  Echo 2017 NORMAL LEFT VENTRICULAR SYSTOLIC FUNCTION  WITH MILD LVH NORMAL RIGHT VENTRICULAR SYSTOLIC FUNCTION MILD VALVULAR REGURGITATION (See above) NO PERICARDIAL EFFUSION GRADE 2 DIASTOLIC DYSFUNCTION MILD AORTIC STENOSIS MILDLY DILATED LEFT ATRIUM MILD PULMONARY HTN WITH ESTIMATED RVSP = 45 MMHG   Echo 12/2018 . Small aortic valve mass, cannot exclude endocarditis vegetation. Measures approximately 0.75 x 1 cm. Demonstrates features of independent motion and is primarily seen on the ventricular aspect of the aortic valve. Best visualized in parasternal long  axis and short axis views. No significant aortic valve stenosis or regurgitation.  2. The left ventricle has normal systolic function, with an ejection fraction of 55-60%. The cavity size was normal. Left ventricular diastolic Doppler parameters are consistent with impaired relaxation.  3. The right ventricle has normal systolic function. The cavity was normal. There is no increase in right ventricular wall thickness. Right ventricular systolic pressure is mildly elevated with an estimated pressure of 35.5 mmHg.  4. The mitral valve is normal in structure.  5. The tricuspid valve is normal in structure.  6. The  pulmonic valve was normal in structure.  7. The aortic valve is normal in structure.  8. Cannot exclude small PFO with left to right shunt by color flow Doppler.  9. The inferior vena cava was dilated in size with  <50% respiratory variability.   Past Medical History:  Diagnosis Date  . Allergic rhinitis   . Allergy   . Anemia   . Anxiety   . Arthritis   . Back pain   . Bradycardia   . Breast mass    Patient can no longer palpate specific masses but showed tech general area of concern  . CHF (congestive heart failure) ([place])   . CKD (chronic kidney disease)    STAGE 3  . Constipation   . Diabetes mellitus without complication ([place])   . Diabetic nephropathy ([place])   . Dyspnea   . GERD (gastroesophageal reflux disease)   . Heart murmur   . HLD (hyperlipidemia)   . Hyperparathyroidism ([place])   . Hypertension   . Joint pain   . Leg edema   . Legally blind in left eye, as defined in [place]   . Lymphedema   . Obesity   . Onychomycosis    Past Surgical History:  Procedure Laterality Date  . ABDOMINAL HYSTERECTOMY    . AMPUTATION Left 05/05/2019   Procedure: AMPUTATION BELOW KNEE;  Surgeon: Katha Cabal, MD;  Location: [facility] ORS;  Service: Vascular;  Laterality: Left;  . BREAST BIOPSY Left 2014   FNA 12:00 position - Negative  . EYE SURGERY Left 2007   removed a lens, no lens implanted  . IR FLUORO GUIDE CV LINE RIGHT  12/29/2018  . IR REMOVAL TUN CV CATH W/O FL  02/19/2019  . IR US GUIDE VASC ACCESS RIGHT  12/29/2018  . TEE WITHOUT CARDIOVERSION N/A 12/28/2018   Procedure: TRANSESOPHAGEAL ECHOCARDIOGRAM (TEE);  Surgeon: Sueanne Margarita, MD;  Location: [facility] ENDOSCOPY;  Service: Cardiovascular;  Laterality: N/A;  . VAGINAL HYSTERECTOMY     abdominal hyst, not a vaginal hyst     Current Meds  Medication Sig  . albuterol (PROVENTIL HFA;VENTOLIN HFA) 108 (90 Base) MCG/ACT inhaler Inhale 1-2 puffs into the lungs every 6 (six) hours as needed for wheezing or shortness of breath.  [name] amLODipine (NORVASC) 10 MG tablet Take 10 mg by mouth daily.  [name] aspirin EC 81 MG EC tablet Take 1 tablet (81 mg total) by mouth daily.  . Blood Glucose Monitoring Suppl (ONE TOUCH ULTRA 2) w/Device KIT 1 Device by  Does not apply route daily. (Patient taking differently: 1 Device by Does not apply route 3 (three) times daily. )  . clobetasol cream (TEMOVATE) 8.02 % Apply 1 application topically 2 (two) times daily.  . cloNIDine (CATAPRES) 0.3 MG tablet Take 0.3 mg by mouth 2 (two) times daily.  . Continuous Blood Gluc Receiver (FREESTYLE LIBRE 14 DAY READER) DEVI USE AS DIRECTED  . Continuous Blood Gluc Sensor (FREESTYLE LIBRE 14 DAY SENSOR) MISC USE DEVICE FOR 14 DAYS AS DIRECTED  . docusate sodium (COLACE) 100 MG capsule Take 1 capsule (100 mg total) by mouth 2 (two) times daily.  . Dulaglutide (TRULICITY) 3 MV/3.6PQ SOPN Inject 3 mg into the skin once a week.  . DULoxetine (CYMBALTA) 30 MG capsule Take 30 mg by mouth daily.   . Ensure Max Protein (ENSURE MAX PROTEIN) LIQD Take 330 mLs (11 oz total) by mouth 2 (two) times daily.  [name] ezetimibe (ZETIA) 10 MG tablet Take 10 mg by mouth  daily.  . fluticasone (FLONASE) 50 MCG/ACT nasal spray Place 1 spray into both nostrils daily.   [name] gabapentin (NEURONTIN) 600 MG tablet Take 600 mg by mouth at bedtime.  [name] glucosamine-chondroitin 500-400 MG tablet Take 1 tablet by mouth 2 (two) times daily.  . insulin lispro (HUMALOG KWIKPEN) 100 UNIT/ML KwikPen 3 times a day (just before each meal) 16-18-19 units, and pen needles 3 day.  . Insulin Pen Needle (BD PEN NEEDLE MICRO U/F) 32G X 6 MM MISC 1 each by Does not apply route 3 (three) times daily.  . isosorbide mononitrate (IMDUR) 120 MG 24 hr tablet Take 1 tablet (120 mg total) by mouth daily. (Patient taking differently: Take 120 mg by mouth in the morning and at bedtime. )  . Lancets (ONETOUCH ULTRASOFT) lancets Used to check blood sugars four times daily. (Patient taking differently: 1 each by Other route 3 (three) times daily. )  . methocarbamol (ROBAXIN) 750 MG tablet Take 1 tablet (750 mg total) by mouth every 12 (twelve) hours as needed for muscle spasms.  . Multiple Vitamin (MULTIVITAMIN WITH MINERALS) TABS tablet  Take 1 tablet by mouth daily.  [name] ofloxacin (OCUFLOX) 0.3 % ophthalmic solution Place 1 drop into both eyes in the morning, at noon, in the evening, and at bedtime.   [name] olmesartan (BENICAR) 20 MG tablet Take 40 mg by mouth daily. Pt is taking 64m daily  . omeprazole (PRILOSEC) 40 MG capsule Take 40 mg by mouth 2 (two) times daily.  . ondansetron (ZOFRAN) 4 MG tablet Take 4 mg by mouth every 8 (eight) hours as needed for nausea or vomiting.  .Glory RosebushVERIO test strip USE 1 STRIP TO CHECK GLUCOSE TWICE DAILY  . prednisoLONE acetate (PRED FORTE) 1 % ophthalmic suspension Place 1 drop into both eyes in the morning, at noon, in the evening, and at bedtime.   . torsemide (DEMADEX) 20 MG tablet Take 1 tablet (20 mg total) by mouth 2 (two) times daily. Take an extra 268mtablet daily prn in the morning for swelling or sob.  . traMADol (ULTRAM) 50 MG tablet One to Two Tabs Every Six Hours As Needed For Pain  . vitamin B-12 (CYANOCOBALAMIN) 1000 MCG tablet Take 1,000 mcg by mouth daily.     Allergies:   Statins, Penicillins, Hydralazine, Codeine, Ibuprofen, Percocet [oxycodone-acetaminophen], Tramadol, and Vicodin [hydrocodone-acetaminophen]   Social History   Tobacco Use  . Smoking status: Never Smoker  . Smokeless tobacco: Never Used  Vaping Use  . Vaping Use: Never used  Substance Use Topics  . Alcohol use: No  . Drug use: Never     Family Hx: The patient's family history includes Breast cancer (age of onset: 5832in her sister; Diabetes in her mother and sister; Eating disorder in her mother; Hyperlipidemia in her mother; Hypertension in her mother; Obesity in her mother.  ROS:   Please see the history of present illness.    Review of Systems  Constitutional: Negative.   HENT: Negative.   Respiratory: Negative.   Cardiovascular: Positive for leg swelling.  Gastrointestinal: Negative.   Musculoskeletal: Positive for joint pain.  Neurological: Negative.   Psychiatric/Behavioral:  Negative.   All other systems reviewed and are negative.    Labs/Other Tests and Data Reviewed:    Recent Labs: No results found for requested labs within last 8760 hours.   Recent Lipid Panel Lab Results  Component Value Date/Time   CHOL 191 06/25/2018 10:50 AM   CHOL 157 11/20/2013 09:54 AM  TRIG 104 06/25/2018 10:50 AM   TRIG 114 11/20/2013 09:54 AM   HDL 47 06/25/2018 10:50 AM   HDL 38 (L) 11/20/2013 09:54 AM   LDLCALC 123 (H) 06/25/2018 10:50 AM   LDLCALC 96 11/20/2013 09:54 AM    Wt Readings from Last 3 Encounters:  08/30/20 (!) 313 lb (142 kg)  08/02/20 300 lb (136.1 kg)  07/25/20 300 lb (136.1 kg)     Exam:    Vital Signs: Vital signs may also be detailed in the HPI BP (!) 146/82   Pulse 65   Ht 5' 8"  (1.727 m)   Wt (!) 313 lb (142 kg)   BMI 47.59 kg/m  Constitutional:  oriented to person, place, and time. No distress.  Obese HENT:  Head: Grossly normal Eyes:  no discharge. No scleral icterus.  Neck: No JVD, no carotid bruits  Cardiovascular: Regular rate and rhythm, no murmurs appreciated Pulmonary/Chest: Clear to auscultation bilaterally, no wheezes or rails Abdominal: Soft.  no distension.  no tenderness.  Musculoskeletal: Normal range of motion, amputation left lower extremity, prosthesis in place Neurological:  normal muscle tone. Coordination normal. No atrophy Skin: Skin warm and dry Psychiatric: normal affect, pleasant  ASSESSMENT & PLAN:    Chronic diastolic CHF (congestive heart failure) (HCC) Recommend she continue torsemide 20 mg twice daily She continues to be followed by right lower extremity swelling which she attributes to the amlodipine Changes as below  Essential hypertension Previously did not tolerate hydralazine instead of amlodipine She would like to try something else rather than amlodipine Few options given underlying renal dysfunction Recommend she start Cardura 1 mg twice daily and stop the amlodipine  Leg  edema Chronic lymphedema, will hold amlodipine as above, leg elevation Could consider Ace wraps, compression hose  Chest pain of uncertain etiology Currently with no symptoms of angina. No further workup at this time. Continue current medication regimen.  Endocarditis February 2020 Completed 6 weeks antibiotics Denies any recurrence of symptoms    Total encounter time more than 25 minutes  Greater than 50% was spent in counseling and coordination of care with the patient   Signed, Ida Rogue, MD  08/30/2020 9:00 AM    [facility] [address], [place], [place] [address]

## 2020-08-30 ENCOUNTER — Ambulatory Visit (INDEPENDENT_AMBULATORY_CARE_PROVIDER_SITE_OTHER): Payer: Medicare Other | Admitting: Cardiovascular Disease

## 2020-08-30 ENCOUNTER — Other Ambulatory Visit: Payer: Self-pay

## 2020-08-30 ENCOUNTER — Encounter: Payer: Self-pay | Admitting: Cardiovascular Disease

## 2020-08-30 VITALS — BP 146/82 | HR 65 | Ht 68.0 in | Wt 313.0 lb

## 2020-08-30 DIAGNOSIS — R079 Chest pain, unspecified: Secondary | ICD-10-CM

## 2020-08-30 DIAGNOSIS — R600 Localized edema: Secondary | ICD-10-CM

## 2020-08-30 DIAGNOSIS — I10 Essential (primary) hypertension: Secondary | ICD-10-CM | POA: Diagnosis not present

## 2020-08-30 DIAGNOSIS — I5032 Chronic diastolic (congestive) heart failure: Secondary | ICD-10-CM

## 2020-08-30 DIAGNOSIS — N184 Chronic kidney disease, stage 4 (severe): Secondary | ICD-10-CM

## 2020-08-30 MED ORDER — DOXAZOSIN MESYLATE 1 MG PO TABS: 1.0000 mg | ORAL_TABLET | Freq: Two times a day (BID) | ORAL | 6 refills | Status: DC

## 2020-08-30 NOTE — Patient Instructions (Addendum)
Medication Instructions:  Hold the amlodipine Start cardura 1 mg twice a day  Monitor blood pressure, please call with numbers  If you need a refill on your cardiac medications before your next appointment, please call your pharmacy.    Lab work: No new labs needed   If you have labs (blood work) drawn today and your tests are completely normal, you will receive your results only by: [name] MyChart Message (if you have MyChart) OR . A paper copy in the mail If you have any lab test that is abnormal or we need to change your treatment, we will call you to review the results.   Testing/Procedures: No new testing needed   Follow-Up: At [facility], you and your health needs are our priority.  As part of our continuing mission to provide you with exceptional heart care, we have created designated Provider Care Teams.  These Care Teams include your primary Cardiologist (physician) and Advanced Practice Providers (APPs -  Physician Assistants and Nurse Practitioners) who all work together to provide you with the care you need, when you need it.  . You will need a follow up appointment in 6 months  . Providers on your designated Care Team:   . Murray Hodgkins, NP . Christell Faith, PA-C . Marrianne Mood, PA-C  Any Other Special Instructions Will Be Listed Below (If Applicable).  COVID-19 Vaccine Information can be found at: ShippingScam.co.uk For questions related to vaccine distribution or appointments, please email [email or web address] or call [phone].

## 2020-09-04 ENCOUNTER — Ambulatory Visit: Payer: Medicare Other | Admitting: Cardiovascular Disease

## 2020-09-11 ENCOUNTER — Other Ambulatory Visit: Payer: Self-pay | Admitting: Endocrinology

## 2020-09-12 ENCOUNTER — Other Ambulatory Visit: Payer: Self-pay | Admitting: Endocrinology

## 2020-09-12 DIAGNOSIS — E1142 Type 2 diabetes mellitus with diabetic polyneuropathy: Secondary | ICD-10-CM

## 2020-09-12 DIAGNOSIS — IMO0002 Reserved for concepts with insufficient information to code with codable children: Secondary | ICD-10-CM

## 2020-09-12 DIAGNOSIS — E1165 Type 2 diabetes mellitus with hyperglycemia: Secondary | ICD-10-CM

## 2020-09-19 ENCOUNTER — Encounter: Payer: Medicare Other | Admitting: Student in an Organized Health Care Education/Training Program

## 2020-10-13 ENCOUNTER — Ambulatory Visit: Payer: Medicare Other | Admitting: Endocrinology

## 2020-10-18 ENCOUNTER — Encounter: Payer: Medicare Other | Admitting: Student in an Organized Health Care Education/Training Program

## 2020-10-30 ENCOUNTER — Other Ambulatory Visit (HOSPITAL_COMMUNITY): Payer: Self-pay | Admitting: Internal Medicine

## 2020-10-30 ENCOUNTER — Other Ambulatory Visit: Payer: Self-pay | Admitting: Internal Medicine

## 2020-10-30 DIAGNOSIS — R110 Nausea: Secondary | ICD-10-CM

## 2020-10-30 DIAGNOSIS — R1011 Right upper quadrant pain: Secondary | ICD-10-CM

## 2020-11-06 ENCOUNTER — Ambulatory Visit: Payer: Medicare Other | Attending: Internal Medicine

## 2020-11-07 ENCOUNTER — Encounter: Payer: Medicare Other | Admitting: Student in an Organized Health Care Education/Training Program

## 2020-11-17 ENCOUNTER — Ambulatory Visit: Payer: Medicare Other | Admitting: Endocrinology

## 2020-12-01 DIAGNOSIS — E1151 Type 2 diabetes mellitus with diabetic peripheral angiopathy without gangrene: Secondary | ICD-10-CM | POA: Insufficient documentation

## 2020-12-05 ENCOUNTER — Other Ambulatory Visit: Payer: Self-pay | Admitting: Family Medicine

## 2020-12-05 DIAGNOSIS — R059 Cough, unspecified: Secondary | ICD-10-CM

## 2020-12-12 ENCOUNTER — Ambulatory Visit
Admission: RE | Admit: 2020-12-12 | Discharge: 2020-12-12 | Disposition: A | Discharge status: Home / Self Care | Payer: Medicare Other | Source: Ambulatory Visit | Attending: Family Medicine | Admitting: Family Medicine

## 2020-12-12 ENCOUNTER — Encounter: Payer: Self-pay | Admitting: Cardiovascular Disease

## 2020-12-12 ENCOUNTER — Other Ambulatory Visit: Payer: Self-pay

## 2020-12-12 ENCOUNTER — Ambulatory Visit (INDEPENDENT_AMBULATORY_CARE_PROVIDER_SITE_OTHER): Payer: Medicare Other | Admitting: Cardiovascular Disease

## 2020-12-12 VITALS — BP 140/82 | HR 64 | Ht 67.0 in | Wt 313.0 lb

## 2020-12-12 DIAGNOSIS — R059 Cough, unspecified: Secondary | ICD-10-CM | POA: Insufficient documentation

## 2020-12-12 DIAGNOSIS — I739 Peripheral vascular disease, unspecified: Secondary | ICD-10-CM

## 2020-12-12 DIAGNOSIS — I10 Essential (primary) hypertension: Secondary | ICD-10-CM

## 2020-12-12 DIAGNOSIS — I358 Other nonrheumatic aortic valve disorders: Secondary | ICD-10-CM

## 2020-12-12 DIAGNOSIS — R600 Localized edema: Secondary | ICD-10-CM

## 2020-12-12 DIAGNOSIS — N184 Chronic kidney disease, stage 4 (severe): Secondary | ICD-10-CM | POA: Diagnosis not present

## 2020-12-12 DIAGNOSIS — Z952 Presence of prosthetic heart valve: Secondary | ICD-10-CM

## 2020-12-12 DIAGNOSIS — I5032 Chronic diastolic (congestive) heart failure: Secondary | ICD-10-CM

## 2020-12-12 DIAGNOSIS — I359 Nonrheumatic aortic valve disorder, unspecified: Secondary | ICD-10-CM

## 2020-12-12 DIAGNOSIS — R079 Chest pain, unspecified: Secondary | ICD-10-CM

## 2020-12-12 MED ORDER — DOXAZOSIN MESYLATE 2 MG PO TABS: 2.0000 mg | ORAL_TABLET | Freq: Two times a day (BID) | ORAL | 3 refills | Status: DC

## 2020-12-12 NOTE — Patient Instructions (Addendum)
Medication Instructions:  Take Cardura /Dozazosin up to 2 mg twice a day  Please continue your current medications, there are no changes at this. If you need any refills on your cardiac medications, please reach out to your pharmacy, they will send in request.  Lab work: No new labs needed    Testing/Procedures: Echo for prosthetic aortic valve. Diastolic CHF Your physician has requested that you have an echocardiogram. Echocardiography is a painless test that uses sound waves to create images of your heart. It provides your doctor with information about the size and shape of your heart and how well your heart's chambers and valves are working. This procedure takes approximately one hour. There are no restrictions for this procedure.  There is a possibility that an IV may need to be started during your test to inject an image enhancing agent. This is done to obtain more optimal pictures of your heart. Therefore we ask that you do at least drink some water prior to coming in to hydrate your veins.     Follow-Up:  . You will need a follow up appointment in 6 months  . Providers on your designated Care Team:   . Murray Hodgkins, NP . Christell Faith, PA-C . Marrianne Mood, PA-C

## 2020-12-12 NOTE — Progress Notes (Addendum)
Evaluation Performed:  Follow-up visit  Date:  12/12/2020   ID:  Rachel Holmes, DOB [birth year], MRN [id number]  Patient Location:  [address] [place] [address]   Provider location:   [facility], [place] office  PCP:  Sharyne Peach, MD  Cardiologist:  Arvid Right [facility]  Chief Complaint  Patient presents with  . Other    Patient states that she was placed on Nifedipine and it has caused leg swelling. Patient c.o being congested so she is going to get an x-ray after this visit. Meds reviewed verbally with patient.     History of Present Illness:    Rachel Holmes is a 60 y.o. female past medical history of Hypertension;  Anemia of chronic disease;  Obesity, unspecified;  CHF (congestive heart failure) (CMS-HCC);  GERD (gastroesophageal reflux disease);  Bradycardia;  Anxiety,  Hyperparathyroidism (CMS-HCC); Increased PTH level;  CKD (chronic kidney disease) stage 3, GFR 30-59 ml/min;   Type 2 diabetes mellitus with stage 3 chronic kidney disease (CMS-HCC);   Lymphedema   Charcot's joint of left foot  profound Charcot foot deformity in association with this large ulcer Skin ulcer of left ankle with necrosis of bone (HCC)  knee amputation on the left group G streptococcal bacteremia with aortic valve endocarditis  Leg swelling on Ca channel blockers Who presents for follow-up of her chronic diastolic CHF  Last seen by myself in clinic October 2021  For leg swelling, amlodipine was held, she was started on Cardura 1 mg twice a day I recommended she follow-up with [name] with phone calls concerning her blood pressure  PMD started nifedipine,  After 3 days, she stopped secondary to leg swelling Feels her blood pressure still high and needs further medication adjustment Reports her weight is stable, 313 pounds  Troubled by recent cough, sputum, yellow, Scheduled for XRAY today after our visit  Recent lab work reviewed with her HBA1C  7.5 CR 3.22, BUN 86  Follows with Dr. Abigail Butts, has not had recent visit  EKG personally reviewed by myself on todays visit Normal sinus rhythm rate 64 bpm, no St ot T wave changes  Other past medical history reviewed In hospital 12/2018 Sepsis secondary to group G streptococcal bacteremia with aortic valve endocarditis and left leg soft tissue infection: Both TTE and TEE confirmed aortic valve endocarditis. Acute hypoxic respiratory failure likely secondary to aspiration pneumonia and decompensated diastolic heart failure: Occurred after extubation post TEE on 2/24, required BiPAP briefly. --done with ABX  [facility]  2017 stress test done Prior cardiac catheterization.     Prior CV studies:   The following studies were reviewed today:  Echo 2017 NORMAL LEFT VENTRICULAR SYSTOLIC FUNCTION  WITH MILD LVH NORMAL RIGHT VENTRICULAR SYSTOLIC FUNCTION MILD VALVULAR REGURGITATION (See above) NO PERICARDIAL EFFUSION GRADE 2 DIASTOLIC DYSFUNCTION MILD AORTIC STENOSIS MILDLY DILATED LEFT ATRIUM MILD PULMONARY HTN WITH ESTIMATED RVSP = 45 MMHG   Echo 12/2018 . Small aortic valve mass, cannot exclude endocarditis vegetation. Measures approximately 0.75 x 1 cm. Demonstrates features of independent motion and is primarily seen on the ventricular aspect of the aortic valve. Best visualized in parasternal long  axis and short axis views. No significant aortic valve stenosis or regurgitation.  2. The left ventricle has normal systolic function, with an ejection fraction of 55-60%. The cavity size was normal. Left ventricular diastolic Doppler parameters are consistent with impaired relaxation.  3. The right ventricle has normal systolic function. The cavity was normal.  There is no increase in right ventricular wall thickness. Right ventricular systolic pressure is mildly elevated with an estimated pressure of 35.5 mmHg.  4. The mitral valve is normal in structure.  5. The tricuspid valve is normal  in structure.  6. The pulmonic valve was normal in structure.  7. The aortic valve is normal in structure.  8. Cannot exclude small PFO with left to right shunt by color flow Doppler.  9. The inferior vena cava was dilated in size with <50% respiratory variability.   Past Medical History:  Diagnosis Date  . Allergic rhinitis   . Allergy   . Anemia   . Anxiety   . Arthritis   . Back pain   . Bradycardia   . Breast mass    Patient can no longer palpate specific masses but showed tech general area of concern  . CHF (congestive heart failure) ([place])   . CKD (chronic kidney disease)    STAGE 3  . Constipation   . Diabetes mellitus without complication ([place])   . Diabetic nephropathy ([place])   . Dyspnea   . GERD (gastroesophageal reflux disease)   . Heart murmur   . HLD (hyperlipidemia)   . Hyperparathyroidism ([place])   . Hypertension   . Joint pain   . Leg edema   . Legally blind in left eye, as defined in [place]   . Lymphedema   . Obesity   . Onychomycosis    Past Surgical History:  Procedure Laterality Date  . ABDOMINAL HYSTERECTOMY    . AMPUTATION Left 05/05/2019   Procedure: AMPUTATION BELOW KNEE;  Surgeon: Katha Cabal, MD;  Location: [facility] ORS;  Service: Vascular;  Laterality: Left;  . BREAST BIOPSY Left 2014   FNA 12:00 position - Negative  . EYE SURGERY Left 2007   removed a lens, no lens implanted  . IR FLUORO GUIDE CV LINE RIGHT  12/29/2018  . IR REMOVAL TUN CV CATH W/O FL  02/19/2019  . IR US GUIDE VASC ACCESS RIGHT  12/29/2018  . TEE WITHOUT CARDIOVERSION N/A 12/28/2018   Procedure: TRANSESOPHAGEAL ECHOCARDIOGRAM (TEE);  Surgeon: Sueanne Margarita, MD;  Location: [facility] ENDOSCOPY;  Service: Cardiovascular;  Laterality: N/A;  . VAGINAL HYSTERECTOMY     abdominal hyst, not a vaginal hyst     Current Meds  Medication Sig  . albuterol (PROVENTIL HFA;VENTOLIN HFA) 108 (90 Base) MCG/ACT inhaler Inhale 1-2 puffs into the lungs every 6 (six) hours as needed for wheezing or  shortness of breath.  [name] aspirin EC 81 MG EC tablet Take 1 tablet (81 mg total) by mouth daily.  . Blood Glucose Monitoring Suppl (ONE TOUCH ULTRA 2) w/Device KIT 1 Device by Does not apply route daily.  . clobetasol cream (TEMOVATE) 9.45 % Apply 1 application topically 2 (two) times daily.  . cloNIDine (CATAPRES) 0.3 MG tablet Take 0.3 mg by mouth 2 (two) times daily.  . Continuous Blood Gluc Receiver (FREESTYLE LIBRE 14 DAY READER) DEVI USE AS DIRECTED  . Continuous Blood Gluc Sensor (FREESTYLE LIBRE 14 DAY SENSOR) MISC USE DEVICE FOR 14 DAYS AS DIRECTED  . docusate sodium (COLACE) 100 MG capsule Take 1 capsule (100 mg total) by mouth 2 (two) times daily.  [name] doxazosin (CARDURA) 1 MG tablet Take 1 tablet (1 mg total) by mouth 2 (two) times daily.  . Dulaglutide (TRULICITY) 3 WT/8.8EK SOPN INJECT 3MG SUBCUTANEOUSLY ONCE A WEEK  . DULoxetine (CYMBALTA) 30 MG capsule Take 30 mg by mouth daily.   [name]  Ensure Max Protein (ENSURE MAX PROTEIN) LIQD Take 330 mLs (11 oz total) by mouth 2 (two) times daily.  [name] ezetimibe (ZETIA) 10 MG tablet Take 10 mg by mouth daily.  . fluticasone (FLONASE) 50 MCG/ACT nasal spray Place 1 spray into both nostrils daily.   [name] gabapentin (NEURONTIN) 600 MG tablet Take 600 mg by mouth at bedtime.  [name] glucosamine-chondroitin 500-400 MG tablet Take 1 tablet by mouth 2 (two) times daily.  . insulin lispro (HUMALOG KWIKPEN) 100 UNIT/ML KwikPen 3 times a day (just before each meal) 16-18-19 units, and pen needles 3 day.  . Insulin Pen Needle (BD PEN NEEDLE MICRO U/F) 32G X 6 MM MISC 1 each by Does not apply route 3 (three) times daily.  . isosorbide mononitrate (IMDUR) 120 MG 24 hr tablet Take 1 tablet (120 mg total) by mouth daily.  . Lancets (ONETOUCH ULTRASOFT) lancets Used to check blood sugars four times daily.  . methocarbamol (ROBAXIN) 750 MG tablet Take 1 tablet (750 mg total) by mouth every 12 (twelve) hours as needed for muscle spasms.  . Multiple Vitamin (MULTIVITAMIN WITH  MINERALS) TABS tablet Take 1 tablet by mouth daily.  [name] ofloxacin (OCUFLOX) 0.3 % ophthalmic solution Place 1 drop into both eyes in the morning, at noon, in the evening, and at bedtime.   [name] olmesartan (BENICAR) 20 MG tablet Take 40 mg by mouth daily. Pt is taking 33m daily  . omeprazole (PRILOSEC) 40 MG capsule Take 40 mg by mouth 2 (two) times daily.  . ondansetron (ZOFRAN) 4 MG tablet Take 4 mg by mouth every 8 (eight) hours as needed for nausea or vomiting.  .Glory RosebushVERIO test strip USE 1 STRIP TO CHECK GLUCOSE TWICE DAILY  . prednisoLONE acetate (PRED FORTE) 1 % ophthalmic suspension Place 1 drop into both eyes in the morning, at noon, in the evening, and at bedtime.   . torsemide (DEMADEX) 20 MG tablet Take 1 tablet (20 mg total) by mouth 2 (two) times daily. Take an extra 222mtablet daily prn in the morning for swelling or sob.  . traMADol (ULTRAM) 50 MG tablet One to Two Tabs Every Six Hours As Needed For Pain  . vitamin B-12 (CYANOCOBALAMIN) 1000 MCG tablet Take 1,000 mcg by mouth daily.     Allergies:   Nifedipine, Statins, Penicillins, Hydralazine, Codeine, Ibuprofen, Percocet [oxycodone-acetaminophen], Tramadol, and Vicodin [hydrocodone-acetaminophen]   Social History   Tobacco Use  . Smoking status: Never Smoker  . Smokeless tobacco: Never Used  Vaping Use  . Vaping Use: Never used  Substance Use Topics  . Alcohol use: No  . Drug use: Never     Family Hx: The patient's family history includes Breast cancer (age of onset: 5858in her sister; Diabetes in her mother and sister; Eating disorder in her mother; Hyperlipidemia in her mother; Hypertension in her mother; Obesity in her mother.  ROS:   Please see the history of present illness.    Review of Systems  Constitutional: Negative.   HENT: Negative.   Respiratory: Negative.   Cardiovascular: Positive for leg swelling.  Gastrointestinal: Negative.   Musculoskeletal: Positive for joint pain.  Neurological:  Negative.   Psychiatric/Behavioral: Negative.   All other systems reviewed and are negative.    Labs/Other Tests and Data Reviewed:    Recent Labs: No results found for requested labs within last 8760 hours.   Recent Lipid Panel Lab Results  Component Value Date/Time   CHOL 191 06/25/2018 10:50 AM   CHOL  157 11/20/2013 09:54 AM   TRIG 104 06/25/2018 10:50 AM   TRIG 114 11/20/2013 09:54 AM   HDL 47 06/25/2018 10:50 AM   HDL 38 (L) 11/20/2013 09:54 AM   LDLCALC 123 (H) 06/25/2018 10:50 AM   LDLCALC 96 11/20/2013 09:54 AM    Wt Readings from Last 3 Encounters:  12/12/20 (!) 313 lb (142 kg)  08/30/20 (!) 313 lb (142 kg)  08/02/20 300 lb (136.1 kg)     Exam:    Vital Signs: Vital signs may also be detailed in the HPI BP 140/82 (BP Location: Right Arm, Patient Position: Sitting, Cuff Size: Large)   Pulse 64   Ht 5' 7"  (1.702 m)   Wt (!) 313 lb (142 kg)   SpO2 96%   BMI 49.02 kg/m  Constitutional:  oriented to person, place, and time. No distress.  Obese HENT:  Head: Grossly normal Eyes:  no discharge. No scleral icterus.  Neck: No JVD, no carotid bruits  Cardiovascular: Regular rate and rhythm, no murmurs appreciated Pulmonary/Chest: Clear to auscultation bilaterally, no wheezes or rails Abdominal: Soft.  no distension.  no tenderness.  Musculoskeletal: Normal range of motion, amputation left lower extremity, prosthesis in place Neurological:  normal muscle tone. Coordination normal. No atrophy Skin: Skin warm and dry Psychiatric: normal affect, pleasant  ASSESSMENT & PLAN:    Chronic diastolic CHF (congestive heart failure) (HCC) Continue torsemide, weight stable, appears euvolemic We will avoid calcium channel blockers given this causes leg edema  Essential hypertension Unable to tolerate calcium channel blocker secondary to leg swelling She feels her blood pressure continues to run high, will increase the Cardura up to 2 mg twice daily  Leg edema Chronic  lymphedema,  Symptoms improved by holding calcium channel blockers  Chest pain of uncertain etiology Denies anginal symptoms, no ischemic work-up needed at this time  Endocarditis February 2020 Repeat echocardiogram has been ordered to evaluate aortic valve given prior endocarditis    Total encounter time more than 25 minutes  Greater than 50% was spent in counseling and coordination of care with the patient   Signed, Ida Rogue, MD  12/12/2020 10:04 AM    [place] [facility] [address], [place], [place] [address]

## 2020-12-27 ENCOUNTER — Ambulatory Visit (INDEPENDENT_AMBULATORY_CARE_PROVIDER_SITE_OTHER): Payer: Medicare Other

## 2020-12-27 ENCOUNTER — Other Ambulatory Visit: Payer: Self-pay

## 2020-12-27 DIAGNOSIS — Z952 Presence of prosthetic heart valve: Secondary | ICD-10-CM

## 2020-12-27 DIAGNOSIS — I5032 Chronic diastolic (congestive) heart failure: Secondary | ICD-10-CM

## 2020-12-27 DIAGNOSIS — I358 Other nonrheumatic aortic valve disorders: Secondary | ICD-10-CM

## 2020-12-27 LAB — ECHOCARDIOGRAM COMPLETE
AR max vel: 2.72 cm2
AV Area VTI: 3.26 cm2
AV Area mean vel: 2.79 cm2
AV Mean grad: 8 mmHg
AV Peak grad: 15.4 mmHg
Ao pk vel: 1.96 m/s
Area-P 1/2: 4.74 cm2
Calc EF: 53.7 %
S' Lateral: 4.1 cm
Single Plane A2C EF: 53.4 %
Single Plane A4C EF: 54.4 %

## 2020-12-29 ENCOUNTER — Other Ambulatory Visit: Payer: Self-pay | Admitting: Endocrinology

## 2021-03-12 ENCOUNTER — Other Ambulatory Visit: Payer: Self-pay | Admitting: Family Medicine

## 2021-03-12 DIAGNOSIS — Z1231 Encounter for screening mammogram for malignant neoplasm of breast: Secondary | ICD-10-CM

## 2021-03-16 ENCOUNTER — Other Ambulatory Visit: Payer: Self-pay

## 2021-03-16 ENCOUNTER — Ambulatory Visit
Admission: RE | Admit: 2021-03-16 | Discharge: 2021-03-16 | Disposition: A | Discharge status: Home / Self Care | Payer: Medicare Other | Source: Ambulatory Visit | Attending: Family Medicine | Admitting: Family Medicine

## 2021-03-16 DIAGNOSIS — Z1231 Encounter for screening mammogram for malignant neoplasm of breast: Secondary | ICD-10-CM | POA: Diagnosis not present

## 2021-04-02 ENCOUNTER — Other Ambulatory Visit: Payer: Self-pay | Admitting: Endocrinology

## 2021-04-02 DIAGNOSIS — E1142 Type 2 diabetes mellitus with diabetic polyneuropathy: Secondary | ICD-10-CM

## 2021-04-02 DIAGNOSIS — IMO0002 Reserved for concepts with insufficient information to code with codable children: Secondary | ICD-10-CM

## 2021-04-25 ENCOUNTER — Emergency Department
Admission: EM | Admit: 2021-04-25 | Discharge: 2021-04-25 | Disposition: A | Discharge status: Home / Self Care | Payer: Medicare Other | Attending: Emergency Medicine | Admitting: Emergency Medicine

## 2021-04-25 ENCOUNTER — Other Ambulatory Visit: Payer: Self-pay

## 2021-04-25 DIAGNOSIS — E860 Dehydration: Secondary | ICD-10-CM | POA: Diagnosis not present

## 2021-04-25 DIAGNOSIS — I5032 Chronic diastolic (congestive) heart failure: Secondary | ICD-10-CM | POA: Insufficient documentation

## 2021-04-25 DIAGNOSIS — Z7982 Long term (current) use of aspirin: Secondary | ICD-10-CM | POA: Insufficient documentation

## 2021-04-25 DIAGNOSIS — R197 Diarrhea, unspecified: Secondary | ICD-10-CM | POA: Insufficient documentation

## 2021-04-25 DIAGNOSIS — N184 Chronic kidney disease, stage 4 (severe): Secondary | ICD-10-CM | POA: Diagnosis not present

## 2021-04-25 DIAGNOSIS — R112 Nausea with vomiting, unspecified: Secondary | ICD-10-CM | POA: Diagnosis not present

## 2021-04-25 DIAGNOSIS — E1122 Type 2 diabetes mellitus with diabetic chronic kidney disease: Secondary | ICD-10-CM | POA: Insufficient documentation

## 2021-04-25 DIAGNOSIS — Z794 Long term (current) use of insulin: Secondary | ICD-10-CM | POA: Insufficient documentation

## 2021-04-25 DIAGNOSIS — E1142 Type 2 diabetes mellitus with diabetic polyneuropathy: Secondary | ICD-10-CM | POA: Insufficient documentation

## 2021-04-25 DIAGNOSIS — Z79899 Other long term (current) drug therapy: Secondary | ICD-10-CM | POA: Diagnosis not present

## 2021-04-25 DIAGNOSIS — I130 Hypertensive heart and chronic kidney disease with heart failure and stage 1 through stage 4 chronic kidney disease, or unspecified chronic kidney disease: Secondary | ICD-10-CM | POA: Diagnosis not present

## 2021-04-25 LAB — BASIC METABOLIC PANEL
Anion gap: 6 (ref 5–15)
BUN: 68 mg/dL — ABNORMAL HIGH (ref 6–20)
CO2: 22 mmol/L (ref 22–32)
Calcium: 8.4 mg/dL — ABNORMAL LOW (ref 8.9–10.3)
Chloride: 105 mmol/L (ref 98–111)
Creatinine, Ser: 3.81 mg/dL — ABNORMAL HIGH (ref 0.44–1.00)
GFR, Estimated: 13 mL/min — ABNORMAL LOW (ref >60)
Glucose, Bld: 221 mg/dL — ABNORMAL HIGH (ref 70–99)
Potassium: 5 mmol/L (ref 3.5–5.1)
Sodium: 133 mmol/L — ABNORMAL LOW (ref 135–145)

## 2021-04-25 LAB — CBC
HCT: 27.3 % — ABNORMAL LOW (ref 36.0–46.0)
Hemoglobin: 9 g/dL — ABNORMAL LOW (ref 12.0–15.0)
MCH: 29.9 pg (ref 26.0–34.0)
MCHC: 33 g/dL (ref 30.0–36.0)
MCV: 90.7 fL (ref 80.0–100.0)
Platelets: 170 10*3/uL (ref 150–400)
RBC: 3.01 MIL/uL — ABNORMAL LOW (ref 3.87–5.11)
RDW: 12.3 % (ref 11.5–15.5)
WBC: 9.4 10*3/uL (ref 4.0–10.5)
nRBC: 0 % (ref 0.0–0.2)

## 2021-04-25 LAB — URINALYSIS, COMPLETE (UACMP) WITH MICROSCOPIC
Bilirubin Urine: NEGATIVE
Glucose, UA: NEGATIVE mg/dL
Hgb urine dipstick: NEGATIVE
Ketones, ur: NEGATIVE mg/dL
Nitrite: NEGATIVE
Protein, ur: NEGATIVE mg/dL
Specific Gravity, Urine: 1.009 (ref 1.005–1.030)
pH: 5 (ref 5.0–8.0)

## 2021-04-25 MED ORDER — ONDANSETRON HCL 4 MG/2ML IJ SOLN
4.0000 mg | Freq: Once | INTRAMUSCULAR | Status: AC
  Administered 2021-04-25: 4 mg via INTRAVENOUS
  Filled 2021-04-25: qty 2

## 2021-04-25 MED ORDER — ONDANSETRON 4 MG PO TBDP: 4.0000 mg | ORAL_TABLET | Freq: Three times a day (TID) | ORAL | 0 refills | Status: DC | PRN

## 2021-04-25 MED ORDER — SODIUM CHLORIDE 0.9 % IV BOLUS
1000.0000 mL | Freq: Once | INTRAVENOUS | Status: AC
Start: 2021-04-25 — End: 2021-04-25
  Administered 2021-04-25: 1000 mL via INTRAVENOUS

## 2021-04-25 NOTE — ED Triage Notes (Signed)
Pt comes into the ED via POV c/o emesis and dizziness.  PT concerned she may be dehydrated.  Pt states she has been vomiting since yesterday.  Pt denies any pain and states she did a COVID test yesterday at home that was negative.  PT in NAD at this time.

## 2021-04-25 NOTE — ED Provider Notes (Signed)
[facility] Emergency Department Provider Note   ____________________________________________   Event Date/Time   First MD Initiated Contact with Patient 04/25/21 1613     (approximate)  I have reviewed the triage vital signs and the nursing notes.   HISTORY  Chief Complaint Emesis and Weakness    HPI Rachel Holmes is a 60 y.o. female with below stated past medical history presents for nausea/vomiting/diarrhea over the past 24 hours.  Patient denies any sick contacts or recent travel.  Patient denies any food out of the ordinary.  Denies any associated abdominal pain.  Patient states symptoms have remained stable since onset and denies any exacerbating or relieving factors.  Does also endorse some associated dysuria.  Patient currently denies any vision changes, tinnitus, difficulty speaking, facial droop, sore throat, chest pain, shortness of breath, abdominal pain, dysuria, or weakness/numbness/paresthesias in any extremity         Past Medical History:  Diagnosis Date   Allergic rhinitis    Allergy    Anemia    Anxiety    Arthritis    Back pain    Bradycardia    Breast mass    Patient can no longer palpate specific masses but showed tech general area of concern   CHF (congestive heart failure) (HCC)    CKD (chronic kidney disease)    STAGE 3   Constipation    Diabetes mellitus without complication (HCC)    Diabetic nephropathy (HCC)    Dyspnea    GERD (gastroesophageal reflux disease)    Heart murmur    HLD (hyperlipidemia)    Hyperparathyroidism ([place])    Hypertension    Joint pain    Leg edema    Legally blind in left eye, as defined in [place]    Lymphedema    Obesity    Onychomycosis     Patient Active Problem List   Diagnosis Date Noted   Arthropathy of left shoulder 07/25/2020   Dysfunction of left rotator cuff 07/25/2020   Acidosis 09/20/2019   Benign hypertensive kidney disease with chronic kidney disease 09/20/2019    Proteinuria 09/20/2019   Secondary hyperparathyroidism of renal origin ([place]) 09/20/2019   Cataract 09/02/2019   Chronic left shoulder pain 07/13/2019   Wound dehiscence 05/29/2019   Hx of BKA, left ([place]) 05/24/2019   PVD (peripheral vascular disease) ([place]) 05/13/2019   Below-knee amputation of left lower extremity ([place]) 05/13/2019   Chronic osteomyelitis of ankle and foot, left ([place]) 05/05/2019   Chronic diastolic CHF (congestive heart failure) ([place]) 04/19/2019   Leg edema 04/19/2019   Acute bacterial endocarditis 04/19/2019   Chest pain of uncertain etiology 78/93/8101   Ulcer of left ankle ([place]) 04/14/2019   Ankle deformity, left 01/21/2019   Diabetic polyneuropathy associated with type 2 diabetes mellitus ([place]) 01/21/2019   Aortic valve endocarditis    Bacteremia due to Streptococcus 12/24/2018   Diabetic ulcer of left ankle associated with diabetes mellitus due to underlying condition, with fat layer exposed ([place]) 11/09/2018   Malignant hypertension (arteriolar nephrosclerosis), stage 1-4 or unspecified chronic kidney disease 07/15/2018   Stage 4 chronic kidney disease ([place]) 07/15/2018   Morbid obesity with BMI of 45.0-49.9, adult ([place]) 07/15/2018   Excessive daytime sleepiness 07/15/2018   Other fatigue 03/11/2018   Essential hypertension 03/11/2018   Vitamin D deficiency 03/11/2018   Congestive heart failure ([place]) 03/11/2018   Primary osteoarthritis of right knee 01/06/2018   Uncontrolled type 2 diabetes mellitus with polyneuropathy ([place]) 10/03/2017  Chronic pain of right knee 10/02/2017   Obesity 10/02/2017   Charcot's joint of left foot 08/29/2017   Lymphedema 09/06/2016   Hyponatremia with extracellular fluid depletion 02/02/2016   Nausea with vomiting, unspecified 02/02/2016   Closed nondisplaced fracture of right patella 08/23/2015   Acute cystitis without hematuria 08/14/2014   Colitis 08/14/2014   HTN (hypertension), malignant 08/14/2014   Acute renal failure  superimposed on stage 3 chronic kidney disease ([place]) 06/26/2014   Hyperparathyroidism, unspecified ([place]) 02/24/2014   Allergic rhinitis 02/22/2014   Onychomycosis 02/22/2014   Anxiety 11/05/2013   Bradycardia 05/14/2013   Gastro-esophageal reflux disease without esophagitis 04/05/2013   Anemia in other chronic diseases classified elsewhere 01/27/2013    Past Surgical History:  Procedure Laterality Date   ABDOMINAL HYSTERECTOMY     AMPUTATION Left 05/05/2019   Procedure: AMPUTATION BELOW KNEE;  Surgeon: Katha Cabal, MD;  Location: [facility] ORS;  Service: Vascular;  Laterality: Left;   BREAST BIOPSY Left 2014   FNA 12:00 position - Negative   EYE SURGERY Left 2007   removed a lens, no lens implanted   IR FLUORO GUIDE CV LINE RIGHT  12/29/2018   IR REMOVAL TUN CV CATH W/O FL  02/19/2019   IR US GUIDE VASC ACCESS RIGHT  12/29/2018   TEE WITHOUT CARDIOVERSION N/A 12/28/2018   Procedure: TRANSESOPHAGEAL ECHOCARDIOGRAM (TEE);  Surgeon: Sueanne Margarita, MD;  Location: [facility] ENDOSCOPY;  Service: Cardiovascular;  Laterality: N/A;   VAGINAL HYSTERECTOMY     abdominal hyst, not a vaginal hyst    Prior to Admission medications   Medication Sig Start Date End Date Taking? Authorizing Provider  albuterol (PROVENTIL HFA;VENTOLIN HFA) 108 (90 Base) MCG/ACT inhaler Inhale 1-2 puffs into the lungs every 6 (six) hours as needed for wheezing or shortness of breath.    [provider]  aspirin EC 81 MG EC tablet Take 1 tablet (81 mg total) by mouth daily. 05/11/19   Stegmayer, Joelene Millin A, PA-C  Blood Glucose Monitoring Suppl (ONE TOUCH ULTRA 2) w/Device KIT 1 Device by Does not apply route daily. 03/26/18   Renato Shin, MD  clobetasol cream (TEMOVATE) 2.87 % Apply 1 application topically 2 (two) times daily.    [provider]  cloNIDine (CATAPRES) 0.3 MG tablet Take 0.3 mg by mouth 2 (two) times daily.    [provider]  Continuous Blood Gluc Receiver (FREESTYLE LIBRE 14 DAY  READER) DEVI USE AS DIRECTED 07/13/20   Renato Shin, MD  Continuous Blood Gluc Sensor (FREESTYLE LIBRE 14 DAY SENSOR) MISC USE DEVICE FOR 14 DAYS AS DIRECTED 07/13/20   Renato Shin, MD  docusate sodium (COLACE) 100 MG capsule Take 1 capsule (100 mg total) by mouth 2 (two) times daily. 05/10/19   Stegmayer, Joelene Millin A, PA-C  doxazosin (CARDURA) 2 MG tablet Take 1 tablet (2 mg total) by mouth 2 (two) times daily. 12/12/20   Minna Merritts, MD  DULoxetine (CYMBALTA) 30 MG capsule Take 30 mg by mouth daily.  08/28/18   [provider]  Ensure Max Protein (ENSURE MAX PROTEIN) LIQD Take 330 mLs (11 oz total) by mouth 2 (two) times daily. 05/10/19   Stegmayer, Joelene Millin A, PA-C  ezetimibe (ZETIA) 10 MG tablet Take 10 mg by mouth daily. 03/16/20   [provider]  fluticasone (FLONASE) 50 MCG/ACT nasal spray Place 1 spray into both nostrils daily.     [provider]  gabapentin (NEURONTIN) 600 MG tablet Take 600 mg by mouth at bedtime.  [provider]  glucosamine-chondroitin 500-400 MG tablet Take 1 tablet by mouth 2 (two) times daily.    [provider]  insulin lispro (HUMALOG KWIKPEN) 100 UNIT/ML KwikPen 3 times a day (just before each meal) 16-18-19 units, and pen needles 3 day. 07/13/20   Renato Shin, MD  Insulin Pen Needle (BD PEN NEEDLE MICRO U/F) 32G X 6 MM MISC 1 each by Does not apply route 3 (three) times daily. 03/15/19   Renato Shin, MD  isosorbide mononitrate (IMDUR) 120 MG 24 hr tablet Take 1 tablet (120 mg total) by mouth daily. 01/07/20   Minna Merritts, MD  Lancets [facility] ULTRASOFT) lancets Used to check blood sugars four times daily. 03/26/18   Renato Shin, MD  methocarbamol (ROBAXIN) 750 MG tablet Take 1 tablet (750 mg total) by mouth every 12 (twelve) hours as needed for muscle spasms. 06/22/20   Gillis Santa, MD  Multiple Vitamin (MULTIVITAMIN WITH MINERALS) TABS tablet Take 1 tablet by mouth daily.    [provider]   ofloxacin (OCUFLOX) 0.3 % ophthalmic solution Place 1 drop into both eyes in the morning, at noon, in the evening, and at bedtime.  11/22/19   [provider]  olmesartan (BENICAR) 20 MG tablet Take 40 mg by mouth daily. Pt is taking 108m daily    [provider]  omeprazole (PRILOSEC) 40 MG capsule Take 40 mg by mouth 2 (two) times daily.    [provider]  ondansetron (ZOFRAN) 4 MG tablet Take 4 mg by mouth every 8 (eight) hours as needed for nausea or vomiting.    [provider]  [facility] test strip USE 1 STRIP TO CHECK GLUCOSE TWICE DAILY 09/13/20   ERenato Shin MD  prednisoLONE acetate (PRED FORTE) 1 % ophthalmic suspension Place 1 drop into both eyes in the morning, at noon, in the evening, and at bedtime.  11/22/19   [provider]  torsemide (DEMADEX) 20 MG tablet Take 1 tablet (20 mg total) by mouth 2 (two) times daily. Take an extra 237mtablet daily prn in the morning for swelling or sob. 04/19/19   GoMinna MerrittsMD  traMADol (UVeatrice [place]50 MG tablet One to Two Tabs Every Six Hours As Needed For Pain 11/15/19   Schnier, GrDolores LoryMD  TRULICITY 3 MGAU/6.3FHOPN INJECT 3MG SUBCUTANEOUSLY ONCE A WEEK 12/29/20   GhPhilemon KingdomMD  vitamin B-12 (CYANOCOBALAMIN) 1000 MCG tablet Take 1,000 mcg by mouth daily.    [provider]    Allergies Nifedipine, Statins, Penicillins, Hydralazine, Codeine, Ibuprofen, Percocet [oxycodone-acetaminophen], Tramadol, and Vicodin [hydrocodone-acetaminophen]  Family History  Problem Relation Age of Onset   Breast cancer Sister 5882 Diabetes Sister    Diabetes Mother    Hypertension Mother    Hyperlipidemia Mother    Eating disorder Mother    Obesity Mother     Social History Social History   Tobacco Use   Smoking status: Never   Smokeless tobacco: Never  Vaping Use   Vaping Use: Never used  Substance Use Topics   Alcohol use: No   Drug use: Never    Review of  Systems Constitutional: No fever/chills Eyes: No visual changes. ENT: No sore throat. Cardiovascular: Denies chest pain. Respiratory: Denies shortness of breath. Gastrointestinal: Endorses nausea/vomiting/diarrhea no abdominal pain. Genitourinary: Positive for dysuria. Musculoskeletal: Negative for acute arthralgias Skin: Negative for rash. Neurological: Negative for headaches, weakness/numbness/paresthesias in any extremity Psychiatric: Negative for suicidal ideation/homicidal ideation   ____________________________________________  PHYSICAL EXAM:  VITAL SIGNS: ED Triage Vitals  Enc Vitals Group     BP 04/25/21 1508 132/69     Pulse Rate 04/25/21 1508 74     Resp 04/25/21 1508 18     Temp 04/25/21 1508 98.3 F (36.8 C)     Temp Source 04/25/21 1508 Oral     SpO2 04/25/21 1508 94 %     Weight 04/25/21 1509 300 lb (136.1 kg)     Height 04/25/21 1509 5' 8"  (1.727 m)     Head Circumference --      Peak Flow --      Pain Score 04/25/21 1509 0     Pain Loc --      Pain Edu? --      Excl. in [place]? --    Constitutional: Alert and oriented. Well appearing obese African-American female who appears stated age and is in no acute distress. Eyes: Conjunctivae are normal. PERRL. Head: Atraumatic. Nose: No congestion/rhinnorhea. Mouth/Throat: Mucous membranes are moist. Neck: No stridor Cardiovascular: Grossly normal heart sounds.  Good peripheral circulation. Respiratory: Normal respiratory effort.  No retractions. Gastrointestinal: Soft and nontender. No distention. Musculoskeletal: No obvious deformities Neurologic:  Normal speech and language. No gross focal neurologic deficits are appreciated. Skin:  Skin is warm and dry. No rash noted. Psychiatric: Mood and affect are normal. Speech and behavior are normal.  ____________________________________________   LABS (all labs ordered are listed, but only abnormal results are displayed)  Labs Reviewed  BASIC METABOLIC PANEL -  Abnormal; Notable for the following components:      Result Value   Sodium 133 (*)    Glucose, Bld 221 (*)    BUN 68 (*)    Creatinine, Ser 3.81 (*)    Calcium 8.4 (*)    GFR, Estimated 13 (*)    All other components within normal limits  CBC - Abnormal; Notable for the following components:   RBC 3.01 (*)    Hemoglobin 9.0 (*)    HCT 27.3 (*)    All other components within normal limits  URINALYSIS, COMPLETE (UACMP) WITH MICROSCOPIC   ____________________________________________  EKG  ED ECG REPORT I, Naaman Plummer, the attending physician, personally viewed and interpreted this ECG.  Date: 04/25/2021 EKG Time: 1513 Rate: 75 Rhythm: normal sinus rhythm QRS Axis: normal Intervals: normal ST/T Wave abnormalities: normal Narrative Interpretation: no evidence of acute ischemia   PROCEDURES  Procedure(s) performed (including Critical Care):  .1-3 Lead EKG Interpretation  Date/Time: 04/25/2021 5:23 PM Performed by: Naaman Plummer, MD Authorized by: Naaman Plummer, MD     Interpretation: normal     ECG rate:  67   ECG rate assessment: normal     Rhythm: sinus rhythm     Ectopy: none     Conduction: normal     ____________________________________________   INITIAL IMPRESSION / ASSESSMENT AND PLAN / ED COURSE  As part of my medical decision making, I reviewed the following data within the [place] notes reviewed and incorporated, Labs reviewed, EKG interpreted, Old chart reviewed, Radiograph reviewed and Notes from prior ED visits reviewed and incorporated       Patient presents for acute nausea/vomiting/diarrhea for 24 hours The cause of the patients symptoms is not clear, but the patient is overall well appearing and is suspected to have a transient course of illness.  Given History and Exam there does not appear to be an emergent cause of the symptoms such as  small bowel obstruction, coronary syndrome, bowel ischemia, DKA,  pancreatitis, appendicitis, other acute abdomen or other emergent problem.  Reassessment: After treatment, the patient is feeling much better, tolerating PO fluids, and shows no signs of dehydration.   Disposition: Discharge home with prompt primary care physician follow up in the next 48 hours. Strict return precautions discussed.      ____________________________________________   FINAL CLINICAL IMPRESSION(S) / ED DIAGNOSES  Final diagnoses:  Nausea vomiting and diarrhea  Dehydration     ED Discharge Orders     None        Note:  This document was prepared using Dragon voice recognition software and may include unintentional dictation errors.    Naaman Plummer, MD 04/25/21 [phone]

## 2021-05-28 ENCOUNTER — Other Ambulatory Visit: Payer: Self-pay | Admitting: Endocrinology

## 2021-05-28 DIAGNOSIS — IMO0002 Reserved for concepts with insufficient information to code with codable children: Secondary | ICD-10-CM

## 2021-05-28 DIAGNOSIS — E1165 Type 2 diabetes mellitus with hyperglycemia: Secondary | ICD-10-CM

## 2021-06-17 NOTE — Progress Notes (Signed)
Evaluation Performed:  Follow-up visit  Date:  06/18/2021   ID:  Rachel Holmes, DOB Nov 29, 1960, MRN [id number]  Patient Location:  [address] [place] [address]   Provider location:   [facility], [place] office  PCP:  Sharyne Peach, MD  Cardiologist:  Arvid Right [facility]  Chief Complaint  Patient presents with   6 month follow up     "Doing well." Medications reviewed by the patient verbally.     History of Present Illness:    Rachel Holmes is a 60 y.o. female past medical history of Hypertension;  Anemia of chronic disease;  Obesity, unspecified;  CHF (congestive heart failure) (CMS-HCC);  GERD (gastroesophageal reflux disease);  Bradycardia;  Anxiety,  Hyperparathyroidism (CMS-HCC); Increased PTH level;  CKD (chronic kidney disease) stage 3, GFR 30-59 ml/min;   Type 2 diabetes mellitus with stage 3 chronic kidney disease (CMS-HCC);   Lymphedema   Charcot's joint of left foot  profound Charcot foot deformity in association with this large ulcer Skin ulcer of left ankle with necrosis of bone (HCC)  knee amputation on the left group G streptococcal bacteremia with aortic valve endocarditis  Leg swelling on Ca channel blockers Who presents for follow-up of her chronic diastolic CHF  Last seen by myself in clinic February 2022 Previous intolerance to calcium channel blockers, causing lower extremity edema Amlodipine and nifedipine previously held  Leg swelling stable, though she has more swelling in her abdomen, weight running higher Wonders if she needs more torsemide Denies shortness of breath  Recent lab work reviewed with her HBA1C 7.5 CR 3.8, BUN 55 HGB 9.0  Follows with Dr. Abigail Butts, has not had recent visit  EKG personally reviewed by myself on todays visit Normal sinus rhythm rate 65 bpm, no ST or T wave changes  Other past medical history reviewed In hospital 12/2018 Sepsis secondary to group G streptococcal  bacteremia with aortic valve endocarditis and left leg soft tissue infection: Both TTE and TEE confirmed aortic valve endocarditis. Acute hypoxic respiratory failure likely secondary to aspiration pneumonia and decompensated diastolic heart failure: Occurred after extubation post TEE on  2/24, required BiPAP briefly. --done with ABX  [facility]  2017 stress test done Prior cardiac catheterization.    Prior CV studies:   The following studies were reviewed today:  Echo 2017 NORMAL LEFT VENTRICULAR SYSTOLIC FUNCTION   WITH MILD LVH NORMAL RIGHT VENTRICULAR SYSTOLIC FUNCTION MILD VALVULAR REGURGITATION (See above) NO PERICARDIAL EFFUSION GRADE 2 DIASTOLIC DYSFUNCTION MILD AORTIC STENOSIS MILDLY DILATED LEFT ATRIUM MILD PULMONARY HTN WITH ESTIMATED RVSP = 45 MMHG   Echo 12/2018 . Small aortic valve mass, cannot exclude endocarditis vegetation. Measures approximately 0.75 x 1 cm. Demonstrates features of independent motion and is primarily seen on the ventricular aspect of the aortic valve. Best visualized in parasternal long  axis and short axis views. No significant aortic valve stenosis or regurgitation.  2. The left ventricle has normal systolic function, with an ejection fraction of 55-60%. The cavity size was normal. Left ventricular diastolic Doppler parameters are consistent with impaired relaxation.  3. The right ventricle has normal systolic function. The cavity was normal. There is no increase in right ventricular wall thickness. Right ventricular systolic pressure is mildly elevated with an estimated pressure of 35.5 mmHg.  4. The mitral valve is normal in structure.  5. The tricuspid valve is normal in structure.  6. The pulmonic valve was normal in structure.  7. The aortic valve  is normal in structure.  8. Cannot exclude small PFO with left to right shunt by color flow Doppler.  9. The inferior vena cava was dilated in size with <50% respiratory variability.   Past Medical  History:  Diagnosis Date   Allergic rhinitis    Allergy    Anemia    Anxiety    Arthritis    Back pain    Bradycardia    Breast mass    Patient can no longer palpate specific masses but showed tech general area of concern   CHF (congestive heart failure) (HCC)    CKD (chronic kidney disease)    STAGE 3   Constipation    Diabetes mellitus without complication (HCC)    Diabetic nephropathy (HCC)    Dyspnea    GERD (gastroesophageal reflux disease)    Heart murmur    HLD (hyperlipidemia)    Hyperparathyroidism ([place])    Hypertension    Joint pain    Leg edema    Legally blind in left eye, as defined in [place]    Lymphedema    Obesity    Onychomycosis    Past Surgical History:  Procedure Laterality Date   ABDOMINAL HYSTERECTOMY     AMPUTATION Left 05/05/2019   Procedure: AMPUTATION BELOW KNEE;  Surgeon: Katha Cabal, MD;  Location: [facility] ORS;  Service: Vascular;  Laterality: Left;   BREAST BIOPSY Left 2014   FNA 12:00 position - Negative   EYE SURGERY Left 2007   removed a lens, no lens implanted   IR FLUORO GUIDE CV LINE RIGHT  12/29/2018   IR REMOVAL TUN CV CATH W/O FL  02/19/2019   IR US GUIDE VASC ACCESS RIGHT  12/29/2018   TEE WITHOUT CARDIOVERSION N/A 12/28/2018   Procedure: TRANSESOPHAGEAL ECHOCARDIOGRAM (TEE);  Surgeon: Sueanne Margarita, MD;  Location: [facility] ENDOSCOPY;  Service: Cardiovascular;  Laterality: N/A;   VAGINAL HYSTERECTOMY     abdominal hyst, not a vaginal hyst     Current Meds  Medication Sig   albuterol (PROVENTIL HFA;VENTOLIN HFA) 108 (90 Base) MCG/ACT inhaler Inhale 1-2 puffs into the lungs every 6 (six) hours as needed for wheezing or shortness of breath.   aspirin EC 81 MG EC tablet Take 1 tablet (81 mg total) by mouth daily.   baclofen (LIORESAL) 10 MG tablet Take 10 mg by mouth 3 (three) times daily.   Blood Glucose Monitoring Suppl (ONE TOUCH ULTRA 2) w/Device KIT 1 Device by Does not apply route daily.   clobetasol cream (TEMOVATE) 8.67 % Apply 1  application topically 2 (two) times daily.   cloNIDine (CATAPRES) 0.3 MG tablet Take 0.3 mg by mouth 2 (two) times daily.   Continuous Blood Gluc Receiver (FREESTYLE LIBRE 14 DAY READER) DEVI USE AS DIRECTED   Continuous Blood Gluc Sensor (FREESTYLE LIBRE 14 DAY SENSOR) MISC USE DEVICE FOR 14 DAYS AS DIRECTED   docusate sodium (COLACE) 100 MG capsule Take 1 capsule (100 mg total) by mouth 2 (two) times daily.   doxazosin (CARDURA) 4 MG tablet Take 4 mg by mouth at bedtime.   DULoxetine (CYMBALTA) 30 MG capsule Take 30 mg by mouth daily.    Ensure Max Protein (ENSURE MAX PROTEIN) LIQD Take 330 mLs (11 oz total) by mouth 2 (two) times daily.   ezetimibe (ZETIA) 10 MG tablet Take 10 mg by mouth daily.   fluticasone (FLONASE) 50 MCG/ACT nasal spray Place 1 spray into both nostrils daily.    gabapentin (NEURONTIN) 600 MG tablet Take  600 mg by mouth at bedtime.   glucosamine-chondroitin 500-400 MG tablet Take 1 tablet by mouth 2 (two) times daily.   insulin lispro (HUMALOG KWIKPEN) 100 UNIT/ML KwikPen 3 times a day (just before each meal) 16-18-19 units, and pen needles 3 day. (Patient taking differently: Inject 24 Units into the skin 3 (three) times daily.)   Insulin Pen Needle (BD PEN NEEDLE MICRO U/F) 32G X 6 MM MISC 1 each by Does not apply route 3 (three) times daily.   isosorbide mononitrate (IMDUR) 120 MG 24 hr tablet Take 1 tablet (120 mg total) by mouth daily.   Lancets (ONETOUCH ULTRASOFT) lancets Used to check blood sugars four times daily.   methocarbamol (ROBAXIN) 750 MG tablet Take 1 tablet (750 mg total) by mouth every 12 (twelve) hours as needed for muscle spasms.   Multiple Vitamin (MULTIVITAMIN WITH MINERALS) TABS tablet Take 1 tablet by mouth daily.   ofloxacin (OCUFLOX) 0.3 % ophthalmic solution Place 1 drop into both eyes in the morning, at noon, in the evening, and at bedtime.    olmesartan (BENICAR) 20 MG tablet Take 40 mg by mouth daily. Pt is taking 53m daily   omeprazole  (PRILOSEC) 40 MG capsule Take 40 mg by mouth 2 (two) times daily.   ondansetron (ZOFRAN ODT) 4 MG disintegrating tablet Take 1 tablet (4 mg total) by mouth every 8 (eight) hours as needed for nausea or vomiting.   ondansetron (ZOFRAN) 4 MG tablet Take 4 mg by mouth every 8 (eight) hours as needed for nausea or vomiting.   ONETOUCH VERIO test strip USE TO CHECK BLOOD SUGAR TWICE DAILY   prednisoLONE acetate (PRED FORTE) 1 % ophthalmic suspension Place 1 drop into both eyes in the morning, at noon, in the evening, and at bedtime.    torsemide (DEMADEX) 20 MG tablet Take 1 tablet (20 mg total) by mouth 2 (two) times daily. Take an extra 239mtablet daily prn in the morning for swelling or sob.   traMADol (ULTRAM) 50 MG tablet One to Two Tabs Every Six Hours As Needed For Pain   TRULICITY 3 MGPY/0.9XIOPN INJECT 3MG SUBCUTANEOUSLY ONCE A WEEK   vitamin B-12 (CYANOCOBALAMIN) 1000 MCG tablet Take 1,000 mcg by mouth daily.     Allergies:   Nifedipine, Statins, Penicillins, Hydralazine, Codeine, Ibuprofen, Percocet [oxycodone-acetaminophen], Tramadol, and Vicodin [hydrocodone-acetaminophen]   Social History   Tobacco Use   Smoking status: Never   Smokeless tobacco: Never  Vaping Use   Vaping Use: Never used  Substance Use Topics   Alcohol use: No   Drug use: Never     Family Hx: The patient's family history includes Breast cancer (age of onset: 5865in her sister; Diabetes in her mother and sister; Eating disorder in her mother; Hyperlipidemia in her mother; Hypertension in her mother; Obesity in her mother.  ROS:   Please see the history of present illness.    Review of Systems  Constitutional: Negative.   HENT: Negative.    Respiratory: Negative.    Cardiovascular:  Positive for leg swelling.  Gastrointestinal: Negative.   Musculoskeletal:  Positive for joint pain.  Neurological: Negative.   Psychiatric/Behavioral: Negative.    All other systems reviewed and are negative.    Labs/Other Tests and Data Reviewed:    Recent Labs: 04/25/2021: BUN 68; Creatinine, Ser 3.81; Hemoglobin 9.0; Platelets 170; Potassium 5.0; Sodium 133   Recent Lipid Panel Lab Results  Component Value Date/Time   CHOL 191 06/25/2018 10:50 AM  CHOL 157 11/20/2013 09:54 AM   TRIG 104 06/25/2018 10:50 AM   TRIG 114 11/20/2013 09:54 AM   HDL 47 06/25/2018 10:50 AM   HDL 38 (L) 11/20/2013 09:54 AM   LDLCALC 123 (H) 06/25/2018 10:50 AM   LDLCALC 96 11/20/2013 09:54 AM    Wt Readings from Last 3 Encounters:  06/18/21 (!) 324 lb (147 kg)  04/25/21 300 lb (136.1 kg)  12/12/20 (!) 313 lb (142 kg)     Exam:    Vital Signs: Vital signs may also be detailed in the HPI BP (!) 130/58 (BP Location: Left Arm, Patient Position: Sitting, Cuff Size: Large)   Pulse 65   Ht 5' 8" (1.727 m)   Wt (!) 324 lb (147 kg)   SpO2 98%   BMI 49.26 kg/m  Constitutional:  oriented to person, place, and time. No distress.  HENT:  Head: Grossly normal Eyes:  no discharge. No scleral icterus.  Neck: No JVD, no carotid bruits  Cardiovascular: Regular rate and rhythm, no murmurs appreciated Pulmonary/Chest: Clear to auscultation bilaterally, no wheezes or rails Abdominal: Soft.  no distension.  no tenderness.  Musculoskeletal: Normal range of motion Neurological:  normal muscle tone. Coordination normal. No atrophy Skin: Skin warm and dry Psychiatric: normal affect, pleasant   ASSESSMENT & PLAN:    Chronic diastolic CHF (congestive heart failure) (HCC) On torsemide, weight running higher avoid calcium channel blockers given this causes leg edema Recommend she talk with Dr. Abigail Butts whether high-dose torsemide might be needed Would need to proceed cautiously in the setting of a dysfunction  Essential hypertension Unable to tolerate calcium channel blocker secondary to leg swelling Blood pressure is well controlled on today's visit. No changes made to the medications. Cardura 2 mg twice daily,  imdur  Leg edema Chronic lymphedema,  Symptoms improved by holding calcium channel blockers  Chest pain of uncertain etiology Denies anginal symptoms, no ischemic work-up needed at this time  Endocarditis February 2020 Repeat echocardiogram has been ordered to evaluate aortic valve given prior endocarditis    Total encounter time more than 25 minutes  Greater than 50% was spent in counseling and coordination of care with the patient   Signed, Ida Rogue, MD  06/18/2021 2:45 PM    [facility] [address] [place] [address], [place], [place] [address]

## 2021-06-18 ENCOUNTER — Ambulatory Visit (INDEPENDENT_AMBULATORY_CARE_PROVIDER_SITE_OTHER): Payer: Medicare Other | Admitting: Cardiovascular Disease

## 2021-06-18 ENCOUNTER — Other Ambulatory Visit: Payer: Self-pay

## 2021-06-18 ENCOUNTER — Encounter: Payer: Self-pay | Admitting: Cardiovascular Disease

## 2021-06-18 VITALS — BP 130/58 | HR 65 | Ht 68.0 in | Wt 324.0 lb

## 2021-06-18 DIAGNOSIS — I739 Peripheral vascular disease, unspecified: Secondary | ICD-10-CM

## 2021-06-18 DIAGNOSIS — I5032 Chronic diastolic (congestive) heart failure: Secondary | ICD-10-CM

## 2021-06-18 DIAGNOSIS — I10 Essential (primary) hypertension: Secondary | ICD-10-CM | POA: Diagnosis not present

## 2021-06-18 DIAGNOSIS — I358 Other nonrheumatic aortic valve disorders: Secondary | ICD-10-CM

## 2021-06-18 DIAGNOSIS — N184 Chronic kidney disease, stage 4 (severe): Secondary | ICD-10-CM

## 2021-06-18 DIAGNOSIS — E871 Hypo-osmolality and hyponatremia: Secondary | ICD-10-CM | POA: Insufficient documentation

## 2021-06-18 DIAGNOSIS — R079 Chest pain, unspecified: Secondary | ICD-10-CM

## 2021-06-18 DIAGNOSIS — R600 Localized edema: Secondary | ICD-10-CM

## 2021-06-18 MED ORDER — TORSEMIDE 20 MG PO TABS: 20.0000 mg | ORAL_TABLET | Freq: Two times a day (BID) | ORAL | 3 refills | Status: DC

## 2021-06-18 MED ORDER — ISOSORBIDE MONONITRATE ER 120 MG PO TB24: 120.0000 mg | ORAL_TABLET | Freq: Every day | ORAL | 3 refills | Status: DC

## 2021-06-18 MED ORDER — DOXAZOSIN MESYLATE 4 MG PO TABS: 4.0000 mg | ORAL_TABLET | Freq: Every day | ORAL | 3 refills | Status: DC

## 2021-06-18 MED ORDER — CLONIDINE HCL 0.3 MG PO TABS: 0.3000 mg | ORAL_TABLET | Freq: Two times a day (BID) | ORAL | 3 refills | Status: AC

## 2021-06-18 MED ORDER — EZETIMIBE 10 MG PO TABS: 10.0000 mg | ORAL_TABLET | Freq: Every day | ORAL | 3 refills | Status: AC

## 2021-06-18 NOTE — Addendum Note (Signed)
Addended by: Wynema Birch on: 06/18/2021 03:01 PM   Modules accepted: Orders

## 2021-06-18 NOTE — Patient Instructions (Addendum)
Medication Instructions:  No changes  If you need a refill on your cardiac medications before your next appointment, please call your pharmacy.   Lab work: No new labs needed  Testing/Procedures: No new testing needed  Follow-Up: At [facility], you and your health needs are our priority.  As part of our continuing mission to provide you with exceptional heart care, we have created designated Provider Care Teams.  These Care Teams include your primary Cardiologist (physician) and Advanced Practice Providers (APPs -  Physician Assistants and Nurse Practitioners) who all work together to provide you with the care you need, when you need it.  You will need a follow up appointment in 12 months  Providers on your designated Care Team:   Christopher Berge, NP Ryan Dunn, PA-C Jacquelyn Visser, PA-C Cadence Furth, PA-C  COVID-19 Vaccine Information can be found at: [email or web address] For questions related to vaccine distribution or appointments, please email [email or web address] or call [phone]    

## 2021-06-28 ENCOUNTER — Ambulatory Visit: Payer: Medicare Other | Admitting: *Deleted

## 2021-07-06 ENCOUNTER — Ambulatory Visit: Payer: Medicare Other | Admitting: *Deleted

## 2021-07-21 DIAGNOSIS — R32 Unspecified urinary incontinence: Secondary | ICD-10-CM | POA: Insufficient documentation

## 2021-08-06 ENCOUNTER — Other Ambulatory Visit (INDEPENDENT_AMBULATORY_CARE_PROVIDER_SITE_OTHER): Payer: Self-pay | Admitting: Nurse Practitioner

## 2021-08-06 DIAGNOSIS — N185 Chronic kidney disease, stage 5: Secondary | ICD-10-CM

## 2021-08-08 ENCOUNTER — Ambulatory Visit (INDEPENDENT_AMBULATORY_CARE_PROVIDER_SITE_OTHER): Payer: Medicare Other

## 2021-08-08 ENCOUNTER — Ambulatory Visit (INDEPENDENT_AMBULATORY_CARE_PROVIDER_SITE_OTHER): Payer: Medicare Other | Admitting: Nurse Practitioner

## 2021-08-08 ENCOUNTER — Encounter (INDEPENDENT_AMBULATORY_CARE_PROVIDER_SITE_OTHER): Payer: Self-pay | Admitting: Nurse Practitioner

## 2021-08-08 ENCOUNTER — Other Ambulatory Visit: Payer: Self-pay

## 2021-08-08 VITALS — BP 132/85 | HR 76 | Ht 68.0 in | Wt 300.0 lb

## 2021-08-08 DIAGNOSIS — I10 Essential (primary) hypertension: Secondary | ICD-10-CM | POA: Diagnosis not present

## 2021-08-08 DIAGNOSIS — N185 Chronic kidney disease, stage 5: Secondary | ICD-10-CM

## 2021-08-18 ENCOUNTER — Encounter (INDEPENDENT_AMBULATORY_CARE_PROVIDER_SITE_OTHER): Payer: Self-pay | Admitting: Nurse Practitioner

## 2021-08-18 NOTE — Progress Notes (Signed)
Subjective:    Patient ID: Rachel Holmes, female    DOB: 07-08-1961, 60 y.o.   MRN: [id number] Chief Complaint  Patient presents with   Follow-up    CKDV. [place]. HDA     Rachel Holmes is a 60 year old female that is seen for evaluation for dialysis access. The patient has chronic renal insufficiency stage V secondary to hypertension and diabetes. The patient's most recent creatinine clearance is less than 20. The patient volume status has not yet become an issue. Patient's blood pressures been relatively well controlled. There are mild uremic symptoms which appear to be relatively well tolerated at this time. The patient is right-handed.  The patient has been considering the various methods of dialysis and wishes to proceed with hemodialysis and therefore creation of AV access.  The patient denies amaurosis fugax or recent TIA symptoms. There are no recent neurological changes noted. The patient denies claudication symptoms or rest pain symptoms. The patient denies history of DVT, PE or superficial thrombophlebitis. The patient denies recent episodes of angina or shortness of breath.    The patient has adequate vein diameters for creation of a left radiocephalic AV fistula.   Review of Systems  Constitutional:  Positive for fatigue.  Cardiovascular:  Positive for leg swelling.      Objective:   Physical Exam Vitals reviewed.  HENT:     Head: Normocephalic.  Cardiovascular:     Rate and Rhythm: Normal rate.     Pulses: Normal pulses.  Pulmonary:     Effort: Pulmonary effort is normal.  Musculoskeletal:     Left Lower Extremity: Left leg is amputated below knee.  Neurological:     Mental Status: She is alert and oriented to person, place, and time.     Motor: Weakness present.  Psychiatric:        Mood and Affect: Mood normal.        Behavior: Behavior normal.        Thought Content: Thought content normal.        Judgment: Judgment normal.    BP 132/85   Pulse  76   Ht 5' 8"  (1.727 m)   Wt 300 lb (136.1 kg)   BMI 45.61 kg/m   Past Medical History:  Diagnosis Date   Allergic rhinitis    Allergy    Anemia    Anxiety    Arthritis    Back pain    Bradycardia    Breast mass    Patient can no longer palpate specific masses but showed tech general area of concern   CHF (congestive heart failure) (HCC)    CKD (chronic kidney disease)    STAGE 3   Constipation    Diabetes mellitus without complication (HCC)    Diabetic nephropathy (HCC)    Dyspnea    GERD (gastroesophageal reflux disease)    Heart murmur    HLD (hyperlipidemia)    Hyperparathyroidism (HCC)    Hypertension    Joint pain    Leg edema    Legally blind in left eye, as defined in [place]    Lymphedema    Obesity    Onychomycosis     Social History   Socioeconomic History   Marital status: Divorced    Spouse name: Not on file   Number of children: 1   Years of education: Not on file   Highest education level: Not on file  Occupational History   Occupation: Glass blower/designer    Comment: disability  Tobacco Use   Smoking status: Never   Smokeless tobacco: Never  Vaping Use   Vaping Use: Never used  Substance and Sexual Activity   Alcohol use: No   Drug use: Never   Sexual activity: Not on file  Other Topics Concern   Not on file  Social History Narrative   ** Merged History Encounter **    on disability   Social Determinants of Health   Financial Resource Strain: Not on file  Food Insecurity: Not on file  Transportation Needs: Not on file  Physical Activity: Not on file  Stress: Not on file  Social Connections: Not on file  Intimate Partner Violence: Not on file    Past Surgical History:  Procedure Laterality Date   ABDOMINAL HYSTERECTOMY     AMPUTATION Left 05/05/2019   Procedure: AMPUTATION BELOW KNEE;  Surgeon: Katha Cabal, MD;  Location: [facility] ORS;  Service: Vascular;  Laterality: Left;   BREAST BIOPSY Left 2014   FNA 12:00 position -  Negative   EYE SURGERY Left 2007   removed a lens, no lens implanted   IR FLUORO GUIDE CV LINE RIGHT  12/29/2018   IR REMOVAL TUN CV CATH W/O FL  02/19/2019   IR US GUIDE VASC ACCESS RIGHT  12/29/2018   TEE WITHOUT CARDIOVERSION N/A 12/28/2018   Procedure: TRANSESOPHAGEAL ECHOCARDIOGRAM (TEE);  Surgeon: Sueanne Margarita, MD;  Location: [facility] ENDOSCOPY;  Service: Cardiovascular;  Laterality: N/A;   VAGINAL HYSTERECTOMY     abdominal hyst, not a vaginal hyst    Family History  Problem Relation Age of Onset   Breast cancer Sister 81   Diabetes Sister    Diabetes Mother    Hypertension Mother    Hyperlipidemia Mother    Eating disorder Mother    Obesity Mother     Allergies  Allergen Reactions   Nifedipine Rash   Statins Shortness Of Breath and Other (See Comments)    Wheezing, short of breath   Penicillins Hives and Other (See Comments)    Has patient had a PCN reaction causing immediate rash, facial/tongue/throat swelling, SOB or lightheadedness with hypotension: No Has patient had a PCN reaction causing severe rash involving mucus membranes or skin necrosis: No Has patient had a PCN reaction that required hospitalization No Has patient had a PCN reaction occurring within the last 10 years: No If all of the above answers are "NO", then may proceed with Cephalosporin use.   Hydralazine Itching   Codeine Nausea Only   Ibuprofen Other (See Comments)    Reaction:  Raises pts BP   Percocet [Oxycodone-Acetaminophen] Nausea Only   Tramadol Nausea Only and Other (See Comments)    Can take if she has eaten. Currently takes with good results    Vicodin [Hydrocodone-Acetaminophen] Itching and Nausea Only    CBC Latest Ref Rng & Units 04/25/2021 05/28/2019 05/12/2019  WBC 4.0 - 10.5 K/uL 9.4 9.1 7.4  Hemoglobin 12.0 - 15.0 g/dL 9.0(L) 9.4(L) 8.2(L)  Hematocrit 36.0 - 46.0 % 27.3(L) 29.2(L) 25.3(L)  Platelets 150 - 400 K/uL 170 243 260      CMP     Component Value Date/Time   NA 133  (L) 04/25/2021 1511   NA 139 06/25/2018 1050   NA 137 11/21/2013 0627   K 5.0 04/25/2021 1511   K 4.6 11/21/2013 0627   CL 105 04/25/2021 1511   CL 107 11/21/2013 0627   CO2 22 04/25/2021 1511   CO2 24 11/21/2013 5364  GLUCOSE 221 (H) 04/25/2021 1511   GLUCOSE 95 11/21/2013 0627   BUN 68 (H) 04/25/2021 1511   BUN 43 (H) 06/25/2018 1050   BUN 34 (H) 11/21/2013 0627   CREATININE 3.81 (H) 04/25/2021 1511   CREATININE 1.80 (H) 09/09/2014 1220   CALCIUM 8.4 (L) 04/25/2021 1511   CALCIUM 8.8 09/09/2014 1220   PROT 8.2 (H) 05/28/2019 2252   PROT 6.4 06/25/2018 1050   PROT 7.9 11/19/2013 0934   ALBUMIN 4.0 05/28/2019 2252   ALBUMIN 3.5 06/25/2018 1050   ALBUMIN 3.2 (L) 11/19/2013 0934   AST 27 05/28/2019 2252   AST 19 11/19/2013 0934   ALT 23 05/28/2019 2252   ALT 20 11/19/2013 0934   ALKPHOS 144 (H) 05/28/2019 2252   ALKPHOS 95 11/19/2013 0934   BILITOT 0.4 05/28/2019 2252   BILITOT 0.3 06/25/2018 1050   BILITOT 0.3 11/19/2013 0934   GFRNONAA 13 (L) 04/25/2021 1511   GFRNONAA 31 (L) 09/09/2014 1220   GFRNONAA 26 (L) 11/21/2013 0627   GFRAA 17 (L) 05/28/2019 2252   GFRAA 38 (L) 09/09/2014 1220   GFRAA 30 (L) 11/21/2013 0627     No results found.     Assessment & Plan:   1. Chronic kidney disease, stage V (very severe) (HCC) Recommend:  At this time the patient does not have appropriate extremity access for dialysis  Patient should have a left radial cephalic AVF created.  The risks, benefits and alternative therapies were reviewed in detail with the patient.  All questions were answered.  The patient agrees to proceed with surgery.    2. Essential hypertension Continue antihypertensive medications as already ordered, these medications have been reviewed and there are no changes at this time.    Current Outpatient Medications on File Prior to Visit  Medication Sig Dispense Refill   albuterol (PROVENTIL HFA;VENTOLIN HFA) 108 (90 Base) MCG/ACT inhaler Inhale 1-2  puffs into the lungs every 6 (six) hours as needed for wheezing or shortness of breath.     aspirin EC 81 MG EC tablet Take 1 tablet (81 mg total) by mouth daily.     baclofen (LIORESAL) 10 MG tablet Take 10 mg by mouth 3 (three) times daily.     Blood Glucose Monitoring Suppl (ONE TOUCH ULTRA 2) w/Device KIT 1 Device by Does not apply route daily. 1 each 0   clobetasol cream (TEMOVATE) 3.97 % Apply 1 application topically 2 (two) times daily.     cloNIDine (CATAPRES) 0.3 MG tablet Take 1 tablet (0.3 mg total) by mouth 2 (two) times daily. 180 tablet 3   Continuous Blood Gluc Receiver (FREESTYLE LIBRE 14 DAY READER) DEVI USE AS DIRECTED 1 each 0   Continuous Blood Gluc Sensor (FREESTYLE LIBRE 14 DAY SENSOR) MISC USE DEVICE FOR 14 DAYS AS DIRECTED 6 each 0   docusate sodium (COLACE) 100 MG capsule Take 1 capsule (100 mg total) by mouth 2 (two) times daily. 10 capsule 0   doxazosin (CARDURA) 4 MG tablet Take 1 tablet (4 mg total) by mouth at bedtime. 90 tablet 3   DULoxetine (CYMBALTA) 30 MG capsule Take 30 mg by mouth daily.      Ensure Max Protein (ENSURE MAX PROTEIN) LIQD Take 330 mLs (11 oz total) by mouth 2 (two) times daily.     ezetimibe (ZETIA) 10 MG tablet Take 1 tablet (10 mg total) by mouth daily. 90 tablet 3   fluticasone (FLONASE) 50 MCG/ACT nasal spray Place 1 spray into both nostrils daily.  gabapentin (NEURONTIN) 600 MG tablet Take 600 mg by mouth at bedtime.     glucosamine-chondroitin 500-400 MG tablet Take 1 tablet by mouth 2 (two) times daily.     insulin lispro (HUMALOG KWIKPEN) 100 UNIT/ML KwikPen 3 times a day (just before each meal) 16-18-19 units, and pen needles 3 day. (Patient taking differently: Inject 24 Units into the skin 3 (three) times daily.)     Insulin Pen Needle (BD PEN NEEDLE MICRO U/F) 32G X 6 MM MISC 1 each by Does not apply route 3 (three) times daily. 100 each 3   isosorbide mononitrate (IMDUR) 120 MG 24 hr tablet Take 1 tablet (120 mg total) by mouth  daily. 180 tablet 3   Lancets (ONETOUCH ULTRASOFT) lancets Used to check blood sugars four times daily. 200 each 12   methocarbamol (ROBAXIN) 750 MG tablet Take 1 tablet (750 mg total) by mouth every 12 (twelve) hours as needed for muscle spasms. 60 tablet 2   Multiple Vitamin (MULTIVITAMIN WITH MINERALS) TABS tablet Take 1 tablet by mouth daily.     ofloxacin (OCUFLOX) 0.3 % ophthalmic solution Place 1 drop into both eyes in the morning, at noon, in the evening, and at bedtime.      olmesartan (BENICAR) 20 MG tablet Take 40 mg by mouth daily. Pt is taking 10m daily     omeprazole (PRILOSEC) 40 MG capsule Take 40 mg by mouth 2 (two) times daily.     ondansetron (ZOFRAN ODT) 4 MG disintegrating tablet Take 1 tablet (4 mg total) by mouth every 8 (eight) hours as needed for nausea or vomiting. 20 tablet 0   ondansetron (ZOFRAN) 4 MG tablet Take 4 mg by mouth every 8 (eight) hours as needed for nausea or vomiting.     ONETOUCH VERIO test strip USE TO CHECK BLOOD SUGAR TWICE DAILY 100 each 0   prednisoLONE acetate (PRED FORTE) 1 % ophthalmic suspension Place 1 drop into both eyes in the morning, at noon, in the evening, and at bedtime.      torsemide (DEMADEX) 20 MG tablet Take 1 tablet (20 mg total) by mouth 2 (two) times daily. Take an extra 270mtablet daily prn in the morning for swelling or sob. 180 tablet 3   traMADol (ULTRAM) 50 MG tablet One to Two Tabs Every Six Hours As Needed For Pain 40 tablet 0   TRULICITY 3 MGHQ/6.0QNOPN INJECT 3MG SUBCUTANEOUSLY ONCE A WEEK 12 mL 2   vitamin B-12 (CYANOCOBALAMIN) 1000 MCG tablet Take 1,000 mcg by mouth daily.     No current facility-administered medications on file prior to visit.    There are no Patient Instructions on file for this visit. No follow-ups on file.   FaKris HartmannNP

## 2021-08-31 ENCOUNTER — Telehealth: Payer: Self-pay

## 2021-08-31 NOTE — Telephone Encounter (Signed)
Please contact pt to schedule a F/U appt before papers from [facility] will be completed per Rachel Holmes.  Ty

## 2021-08-31 NOTE — Telephone Encounter (Signed)
Patient was contacted to schedule an appointment, advised front desk staff that she was going to be having her PCP manage her endocrinology needs

## 2021-09-03 NOTE — Telephone Encounter (Signed)
Noted  

## 2021-09-05 ENCOUNTER — Telehealth (INDEPENDENT_AMBULATORY_CARE_PROVIDER_SITE_OTHER): Payer: Self-pay

## 2021-09-05 NOTE — Telephone Encounter (Signed)
Spoke with the patient and she is scheduled with Dr. Delana Meyer for a right brachial cephalic AV fistula on 30/14/99 at [facility]. Pre-op phone call is scheduled on 09/13/21 between 8-12 pm. Pre-surgical instructions were discussed and will be mailed.

## 2021-09-13 ENCOUNTER — Other Ambulatory Visit (INDEPENDENT_AMBULATORY_CARE_PROVIDER_SITE_OTHER): Payer: Self-pay | Admitting: Nurse Practitioner

## 2021-09-13 ENCOUNTER — Encounter: Payer: Self-pay | Admitting: Vascular Surgery

## 2021-09-13 ENCOUNTER — Other Ambulatory Visit: Payer: Self-pay

## 2021-09-13 ENCOUNTER — Encounter
Admission: RE | Admit: 2021-09-13 | Discharge: 2021-09-13 | Disposition: A | Discharge status: Home / Self Care | Payer: Medicare Other | Source: Ambulatory Visit | Attending: Vascular Surgery | Admitting: Vascular Surgery

## 2021-09-13 ENCOUNTER — Telehealth: Payer: Self-pay | Admitting: *Deleted

## 2021-09-13 DIAGNOSIS — I120 Hypertensive chronic kidney disease with stage 5 chronic kidney disease or end stage renal disease: Secondary | ICD-10-CM

## 2021-09-13 DIAGNOSIS — I10 Essential (primary) hypertension: Secondary | ICD-10-CM

## 2021-09-13 DIAGNOSIS — N185 Chronic kidney disease, stage 5: Secondary | ICD-10-CM

## 2021-09-13 DIAGNOSIS — I5032 Chronic diastolic (congestive) heart failure: Secondary | ICD-10-CM

## 2021-09-13 HISTORY — DX: Family history of other specified conditions: Z84.89

## 2021-09-13 HISTORY — DX: Other specified postprocedural states: R11.2

## 2021-09-13 HISTORY — DX: Other specified postprocedural states: Z98.890

## 2021-09-13 NOTE — Progress Notes (Signed)
Perioperative Services  Pre-Admission/Anesthesia Testing Clinical Review  Date: 09/13/21  Patient Demographics:  Name: Rachel Holmes DOB:   [birth year] MRN:   [id number]  Planned Surgical Procedure(s):    Case: [id number] Date/Time: 09/19/21 1035   Procedure: ARTERIOVENOUS (AV) FISTULA CREATION (BRACHIAL CEPHALIC) (Right)   Anesthesia type: General   Pre-op diagnosis: ESRD   Location: [facility] OR ROOM 08 / [facility] [facility] FOR ANESTHESIA GROUP   Surgeons: Katha Cabal, MD   NOTE: Available PAT nursing documentation and vital signs have been reviewed. Clinical nursing staff has updated patient's PMH/PSHx, current medication list, and drug allergies/intolerances to ensure comprehensive history available to assist in medical decision making as it pertains to the aforementioned surgical procedure and anticipated anesthetic course. Extensive review of available clinical information performed. [place] PMH and PSHx updated with any diagnoses/procedures that  may have been inadvertently omitted during her intake with the pre-admission testing department's nursing staff.  Clinical Discussion:  Rachel Holmes is a 60 y.o. female who is submitted for pre-surgical anesthesia review and clearance prior to her undergoing the above procedure. Patient has never been a smoker. Pertinent PMH includes: CHF, mild pulmonary hypertension, aortic stenosis, bradycardia, HTN, HLD, hyperparathyroidism, T2DM, CKD-III, GERD (on daily PPI), lower extremity lymphedema, s/p LEFT BKA, legally blind in LEFT eye, anxiety.  Patient is followed by cardiology Rockey Situ, MD). She was last seen in the cardiology clinic on 06/18/2021; notes reviewed.  At the time of her clinic visit, patient doing well overall from a cardiovascular perspective.  She denied any episodes of chest pain, increased shortness of breath, PND, orthopnea, palpitations, vertiginous symptoms, or presyncope/syncope.  Patient with chronic lower extremity  edema/lymphedema; status post LEFT BKA.  Patient complained of swelling in her abdomen despite diuretic use.  PMH significant for cardiovascular diagnoses.  TTE performed on 09/13/2016 revealed a normal left ventricular systolic function with an EF of 55%.  Left atrium was mildly dilated.  Diastolic Doppler parameters consistent with pseudonormalization (G2DD).  RVSP mildly elevated at 45 mmHg consistent with mild pulmonary hypertension.  Additionally, patient with mild aortic valve stenosis with a mean transvalvular gradient of 11.2 mmHg.  Patient developed sepsis secondary to group G streptococcal bacteremia back in 12/2018.  TTE at that time revealed a normal left ventricular systolic function with an EF of 55-60%. There was a 0.75 x 1 cm mass noted on the patient's aortic valve.  In the setting of her infection, there was concern for endocarditis.  Subsequent TEE with bubble study performed on 12/28/2018 demonstrating a small shaggy mobile density on the aortic side of the RIGHT coronary cusp consistent with vegetation.  Most recent TTE was performed on 12/27/2020 revealing normal left ventricular ejection fraction of 55 to 60%.  Diastolic Doppler parameters consistent with pseudonormalization (G2DD).  GLS -15.1%.  Left atrium was mildly dilated.  There was no evidence of aortic valve stenosis noted on this study; mean transvalvular gradient 8 mmHg.  Blood pressure mildly elevated at 130/58 on currently prescribed alpha-blocker, nitrate, ARB, and diuretic therapies.  Of note, patient previously on CCB therapy as well, however utilized agents caused increased lower extremity edema, and were therefore discontinued. Patient is on ezetimibe for her HLD diagnosis and ASCVD prevention.  T2DM uncontrolled on currently prescribed regimen; last HgbA1c was 9.2% when checked on 04/18/2021.  Functional capacity limited by mobility issues (previous BKA) and underlying multiple medical comorbidities.  Patient was  encouraged to speak with nephrology regarding increased dose of torsemide for increased weight, complaints  of abdominal swelling, and lower extremity edema.  No changes were made to her medication regimen.  Patient to follow-up with outpatient cardiology in 12 months or sooner if needed.  Rachel Holmes with an ESRD diagnosis.  She has yet to begin hemodialysis; pending access.  Patient is scheduled for a RIGHT BRACHIOCEPHALIC ARTERIOVENOUS (AV) FISTULA CREATION on 09/19/2021 with Dr. [name], MD.  Given patient's past medical history significant for cardiovascular diagnoses, presurgical cardiac clearance was sought by the PAT team. Per cardiology, "based ACC/AHA guidelines, the patient's past medical history, and the amount of time since her last clinic visit, this patient would be at an overall ACCEPTABLE risk for the planned procedure without further cardiovascular testing or intervention at this time". This patient is on daily antiplatelet therapy. She has been instructed on recommendations for continuing her daily low dose ASA throughout the perioperative period.   Patient reports previous perioperative complications with anesthesia in the past. Patient has a PMH (+) for PONV.  Order placed by PAT for patient to receive a prophylactic preoperative dose of aprepitant.  Additionally, patient has both a personal and familial history of (+) delayed emergence with general anesthesia.  In review of the available records, it is noted that patient underwent a general anesthetic course here (ASA III) in 05/2019 without documented complications.   Vitals with BMI 08/08/2021 06/18/2021 04/25/2021  Height 5' 8"  5' 8"  -  Weight 300 lbs 324 lbs -  BMI 60.45 40.98 -  Systolic 119 147 829  Diastolic 85 58 68  Pulse 76 65 66    Providers/Specialists:   NOTE: Primary physician provider listed below. Patient may have been seen by APP or partner within same practice.   PROVIDER ROLE / SPECIALTY LAST OV   Schnier, Dolores Lory, MD VASCULAR SURGERY 08/08/2021  Sharyne Peach, MD PRIMARY CARE PROVIDER 07/23/2021  Ida Rogue, MD CARDIOLOGY 06/18/2021  Lavonia Dana, MD NEPHROLOGY 09/04/2021   Allergies:  Nifedipine, Penicillins, Statins, Hydralazine, Hydrocodone, Oxycodone, Codeine, and Ibuprofen  Current Home Medications:   No current facility-administered medications for this encounter.    albuterol (PROVENTIL HFA;VENTOLIN HFA) 108 (90 Base) MCG/ACT inhaler   aspirin EC 81 MG EC tablet   cetirizine (ZYRTEC) 10 MG tablet   clobetasol cream (TEMOVATE) 0.05 %   cloNIDine (CATAPRES) 0.3 MG tablet   docusate sodium (COLACE) 100 MG capsule   doxazosin (CARDURA) 4 MG tablet   DULoxetine (CYMBALTA) 30 MG capsule   ezetimibe (ZETIA) 10 MG tablet   fluticasone (FLONASE) 50 MCG/ACT nasal spray   gabapentin (NEURONTIN) 600 MG tablet   Glucosamine-Chondroit-Vit C-Mn (GLUCOSAMINE 1500 COMPLEX) CAPS   insulin lispro (HUMALOG KWIKPEN) 100 UNIT/ML KwikPen   isosorbide mononitrate (IMDUR) 120 MG 24 hr tablet   methocarbamol (ROBAXIN) 750 MG tablet   Multiple Vitamin (MULTIVITAMIN WITH MINERALS) TABS tablet   olmesartan (BENICAR) 40 MG tablet   omeprazole (PRILOSEC) 40 MG capsule   ondansetron (ZOFRAN ODT) 4 MG disintegrating tablet   torsemide (DEMADEX) 562 MG tablet   TRULICITY 3 ZH/0.8MV SOPN   vitamin B-12 (CYANOCOBALAMIN) 1000 MCG tablet   acetaminophen (TYLENOL) 500 MG tablet   Blood Glucose Monitoring Suppl (ONE TOUCH ULTRA 2) w/Device KIT   Continuous Blood Gluc Receiver (FREESTYLE LIBRE 14 DAY READER) DEVI   Continuous Blood Gluc Sensor (FREESTYLE LIBRE 14 DAY SENSOR) MISC   Insulin Pen Needle (BD PEN NEEDLE MICRO U/F) 32G X 6 MM MISC   Lancets (ONETOUCH ULTRASOFT) lancets   ofloxacin (OCUFLOX) 0.3 % ophthalmic  solution   ONETOUCH VERIO test strip   torsemide (DEMADEX) 20 MG tablet   History:   Past Medical History:  Diagnosis Date   Allergic rhinitis    Allergy     Anemia    Anxiety    Aortic stenosis 09/13/2016   a.) TTE 09/13/2016: mild AS --> MPG 11.2 mmHg. b.) TTE 12/27/2020: EF 55-60%; no AS --> MPG 8 mmHg.   Aortic valve endocarditis 12/25/2018   a.) TTE 12/25/2018: 0.75 x 1 cm AV mass. b.) TEE 12/28/2018: small shaggy mobile density on aortic side of RIGHT coronary cusp consistent with vegetation.   Arthritis    Back pain    Bradycardia    Breast mass    Patient can no longer palpate specific masses but showed tech general area of concern   Charcot's joint of ankle, left    CHF (congestive heart failure) ([place]) 07/09/2007   a.) TTE 07/09/2007: EF 50%; G1DD. b.) TTE 09/13/2016: EF 55%; G2DD. c.) TTE 12/27/2020: EF 55-60%; G2DD; GLS -15.1%.   CKD (chronic kidney disease), stage III (HCC)    Complication of anesthesia    a.) PONV. b.) Delayed emergence   Constipation    Diabetic nephropathy ([place])    Dyspnea    Family history of adverse reaction to anesthesia    a.) Sisters x 2 with (+) delayed emergence.   GERD (gastroesophageal reflux disease)    Heart murmur    HLD (hyperlipidemia)    Hyperparathyroidism ([place])    Hypertension    Joint pain    Leg edema    Legally blind in left eye, as defined in [place]    Lymphedema    Mild pulmonary hypertension ([place]) 09/13/2016   a.) TTE 09/13/2016: EF 55%; RVSP 45 mmHg. b.) TTE 12/25/2018: EF 55-60%; RVSP 35.5 mmHg.   Obesity    Onychomycosis    PONV (postoperative nausea and vomiting)    S/P BKA (below knee amputation) unilateral, left ([place])    Sepsis ([place]) 12/2018   a.) group G streptococcal bacteremia with AV endocarditis and LLE soft tissue infection.   T2DM (type 2 diabetes mellitus) ([place])    Past Surgical History:  Procedure Laterality Date   ABDOMINAL HYSTERECTOMY     AMPUTATION Left 05/05/2019   Procedure: AMPUTATION BELOW KNEE;  Surgeon: Katha Cabal, MD;  Location: [facility] ORS;  Service: Vascular;  Laterality: Left;   BREAST BIOPSY Left 2014   FNA 12:00 position - Negative   EYE  SURGERY Left 2007   removed a lens, no lens implanted   IR FLUORO GUIDE CV LINE RIGHT  12/29/2018   IR REMOVAL TUN CV CATH W/O FL  02/19/2019   IR US GUIDE VASC ACCESS RIGHT  12/29/2018   TEE WITHOUT CARDIOVERSION N/A 12/28/2018   Procedure: TRANSESOPHAGEAL ECHOCARDIOGRAM (TEE);  Surgeon: Sueanne Margarita, MD;  Location: [facility] ENDOSCOPY;  Service: Cardiovascular;  Laterality: N/A;   Family History  Problem Relation Age of Onset   Breast cancer Sister 68   Diabetes Sister    Diabetes Mother    Hypertension Mother    Hyperlipidemia Mother    Eating disorder Mother    Obesity Mother    Social History   Tobacco Use   Smoking status: Never   Smokeless tobacco: Never  Vaping Use   Vaping Use: Never used  Substance Use Topics   Alcohol use: No   Drug use: Never    Pertinent Clinical Results:  LABS: Labs reviewed: Acceptable for surgery.  No visits  with results within 3 Day(s) from this visit.  Latest known visit with results is:  Admission on 04/25/2021, Discharged on 04/25/2021  Component Date Value Ref Range Status   Sodium 04/25/2021 133 (A)  135 - 145 mmol/L Final   Potassium 04/25/2021 5.0  3.5 - 5.1 mmol/L Final   Chloride 04/25/2021 105  98 - 111 mmol/L Final   CO2 04/25/2021 22  22 - 32 mmol/L Final   Glucose, Bld 04/25/2021 221 (A)  70 - 99 mg/dL Final   Glucose reference range applies only to samples taken after fasting for at least 8 hours.   BUN 04/25/2021 68 (A)  6 - 20 mg/dL Final   Creatinine, Ser 04/25/2021 3.81 (A)  0.44 - 1.00 mg/dL Final   Calcium 04/25/2021 8.4 (A)  8.9 - 10.3 mg/dL Final   GFR, Estimated 04/25/2021 13 (A)  >60 mL/min Final   Comment: (NOTE) Calculated using the CKD-EPI Creatinine Equation (2021)    Anion gap 04/25/2021 6  5 - 15 Final   Performed at [facility], [address] [place], [place] [address]   WBC 04/25/2021 9.4  4.0 - 10.5 K/uL Final   RBC 04/25/2021 3.01 (A)  3.87 - 5.11 MIL/uL Final   Hemoglobin 04/25/2021 9.0  (A)  12.0 - 15.0 g/dL Final   HCT 04/25/2021 27.3 (A)  36.0 - 46.0 % Final   MCV 04/25/2021 90.7  80.0 - 100.0 fL Final   MCH 04/25/2021 29.9  26.0 - 34.0 pg Final   MCHC 04/25/2021 33.0  30.0 - 36.0 g/dL Final   RDW 04/25/2021 12.3  11.5 - 15.5 % Final   Platelets 04/25/2021 170  150 - 400 K/uL Final   nRBC 04/25/2021 0.0  0.0 - 0.2 % Final   Performed at [facility], [address] [place], [place] [address]   Color, Urine 04/25/2021 STRAW (A)  YELLOW Final   APPearance 04/25/2021 CLEAR (A)  CLEAR Final   Specific Gravity, Urine 04/25/2021 1.009  1.005 - 1.030 Final   pH 04/25/2021 5.0  5.0 - 8.0 Final   Glucose, UA 04/25/2021 NEGATIVE  NEGATIVE mg/dL Final   Hgb urine dipstick 04/25/2021 NEGATIVE  NEGATIVE Final   Bilirubin Urine 04/25/2021 NEGATIVE  NEGATIVE Final   Ketones, ur 04/25/2021 NEGATIVE  NEGATIVE mg/dL Final   Protein, ur 04/25/2021 NEGATIVE  NEGATIVE mg/dL Final   Nitrite 04/25/2021 NEGATIVE  NEGATIVE Final   Leukocytes,Ua 04/25/2021 TRACE (A)  NEGATIVE Final   RBC / HPF 04/25/2021 0-5  0 - 5 RBC/hpf Final   WBC, UA 04/25/2021 0-5  0 - 5 WBC/hpf Final   Bacteria, UA 04/25/2021 RARE (A)  NONE SEEN Final   Squamous Epithelial / LPF 04/25/2021 0-5  0 - 5 Final   Mucus 04/25/2021 PRESENT   Final   Hyaline Casts, UA 04/25/2021 PRESENT   Final   Performed at [facility], [address] [place], [place] [address]     ECG: Date: 06/18/2021 Time ECG obtained: 1423 PM Rate: 65 bpm Rhythm: normal sinus Axis (leads I and aVF): Normal Intervals: PR 156 ms. QRS 88 ms. QTc 459 ms. ST segment and T wave changes: No evidence of acute ST segment elevation or depression Comparison: Similar to previous tracing obtained on 04/27/2021   IMAGING / PROCEDURES: TRANSTHORACIC ECHOCARDIOGRAM performed on 01/24/2021 Left ventricular ejection fraction, by estimation, is 55 to 60%. The left ventricle has normal function. The left ventricle has no regional wall  motion abnormalities. Left ventricular  diastolic parameters are consistent with Grade II diastolic dysfunction (pseudonormalization). The average left ventricular global longitudinal strain is -15.1 %. The global longitudinal strain is abnormal.  Right ventricular systolic function is normal. The right ventricular size is normal.  Left atrial size was mildly dilated.  The mitral valve is normal in structure. Mild mitral valve regurgitation.  There is a 7 x 5 mm calcification noted on the posterior aspect (non coronary cusp) of the aortic valve. This is more consistent with a calcific mass. The aortic valve is calcified. Aortic valve regurgitation is not visualized. Mild to moderate aortic valve sclerosis/calcification is present, without any evidence of aortic stenosis.  The inferior vena cava is dilated in size with <50% respiratory variability, suggesting right atrial pressure of 15 mmHg.   TRANSESOPHAGEAL ECHOCARDIOGRAM performed on 12/28/2018 The left ventricle has normal systolic function, with an ejection fraction of 55-60%. The cavity size was normal.  The right ventricle has normal systolic function. The cavity was normal. There is no increase in right ventricular wall thickness.  Normal LA and LA appendage with no evidence of thrombus.  The tricuspid valve was normal in structure.  Aortic valve regurgitation is trivial by color flow Doppler. Aortic valve vegetation is present.  There is a small shaggy mobile density on the aortic side of the right coronary cusp consistent with vegetation. It is only appreciated in the  110 degree angle on images #[id number].  The pulmonic valve was normal in structure.  Interatrial Septum: No atrial level shunt detected by color flow Doppler. Agitated saline contrast was given intravenously to evaluate for  intracardiac shunting. Saline contrast bubble study was negative, with no evidence of any interatrial shunt.   Impression and Plan:  Rachel Holmes has  been referred for pre-anesthesia review and clearance prior to her undergoing the planned anesthetic and procedural courses. Available labs, pertinent testing, and imaging results were personally reviewed by me. This patient has been appropriately cleared by cardiology with an overall ACCEPTABLE risk of significant perioperative cardiovascular complications.  Based on clinical review performed today (09/13/21), barring any significant acute changes in the patient's overall condition, it is anticipated that she will be able to proceed with the planned surgical intervention. Any acute changes in clinical condition may necessitate her procedure being postponed and/or cancelled. Patient will meet with anesthesia team (MD and/or CRNA) on the day of her procedure for preoperative evaluation/assessment. Questions regarding anesthetic course will be fielded at that time.   Pre-surgical instructions were reviewed with the patient during her PAT appointment and questions were fielded by PAT clinical staff. Patient was advised that if any questions or concerns arise prior to her procedure then she should return a call to PAT and/or her surgeon's office to discuss.  Honor Loh, MSN, APRN, FNP-C, CEN [facility]  Peri-operative Services Nurse Practitioner Phone: [phone] Fax: [phone] 09/13/21 4:46 PM  NOTE: This note has been prepared using Dragon dictation software. Despite my best ability to proofread, there is always the potential that unintentional transcriptional errors may still occur from this process.

## 2021-09-13 NOTE — Telephone Encounter (Signed)
Request for pre-operative cardiac clearance Received: Today Karen Kitchens, NP  P Cv Div Preop Callback Request for pre-operative cardiac clearance:     1. What type of surgery is being performed?  ARTERIOVENOUS (AV) FISTULA CREATION (BRACHIAL CEPHALIC)   2. When is this surgery scheduled?  09/19/2021     3. Are there any medications that need to be held prior to surgery?  ASA   4. Practice name and name of physician performing surgery?  Performing surgeon: Dr. [name], MD  Requesting clearance: Honor Loh, FNP-C       5. Anesthesia type (none, local, MAC, general)? General   6. What is the office phone and fax number?    Phone: [phone]  Fax: [phone]   ATTENTION: Unable to create telephone message as per your standard workflow. Directed by [facility] providers to send requests for cardiac clearance to this pool for appropriate distribution to provider covering pre-operative clearances.   Honor Loh, MSN, APRN, FNP-C, CEN  [facility]  Peri-operative Services Nurse Practitioner  Phone: [phone]  09/13/21 3:15 PM

## 2021-09-13 NOTE — Telephone Encounter (Signed)
    Patient Name: DUANE TRIAS  DOB: October 26, 1961 MRN: [id number]  Primary Cardiologist: Ida Rogue, MD  Chart reviewed as part of pre-operative protocol coverage. Given past medical history and time since last visit, based on ACC/AHA guidelines, MARIELLEN BLANEY would be at acceptable risk for the planned procedure without further cardiovascular testing.   Dr. Rockey Situ, can patient hold ASA as requested or she should continue during peri-op period? No prior hx of stenting. She does not remember who placed her on aspirin.   Please forward your response to P CV DIV PREOP.   Thank you   Leanor Kail, PA 09/13/2021, 4:06 PM

## 2021-09-13 NOTE — Patient Instructions (Signed)
Your procedure is scheduled on:09-19-21 Wednesday Report to the Registration Desk on the 1st floor of the [facility].Then proceed to the 2nd floor Surgery Desk in the [facility] To find out your arrival time, please call [phone] between 1PM - 3PM on:09-18-21 Tuesday  REMEMBER: Instructions that are not followed completely may result in serious medical risk, up to and including death; or upon the discretion of your surgeon and anesthesiologist your surgery may need to be rescheduled.  Do not eat food after midnight the night before surgery.  No gum chewing, lozengers or hard candies.  You may however, drink Water up to 2 hours before you are scheduled to arrive for your surgery. Do not drink anything within 2 hours of your scheduled arrival time  Type 1 and Type 2 diabetics should only drink water.  TAKE THESE MEDICATIONS THE MORNING OF SURGERY WITH A SIP OF WATER: -cloNIDine (CATAPRES) 0.3 MG tablet -omeprazole (PRILOSEC) 40 MG capsule -isosorbide mononitrate (IMDUR) 120 MG 24 hr tablet  Do NOT take insulin lispro (HUMALOG KWIKPEN) 100 UNIT/ML KwikPen the morning of surgery  Continue your aspirin EC 81 MG EC tablet up until the day prior to surgery-Do not take the day of surgery  Use your albuterol (PROVENTIL HFA;VENTOLIN HFA) 108 (90 Base) MCG/ACT inhaler the morning of surgery and bring inhaler to the hospital  One week prior to surgery: Stop Anti-inflammatories (NSAIDS) such as Advil, Aleve, Ibuprofen, Motrin, Naproxen, Naprosyn and Aspirin based products such as Excedrin, Goodys Powder, BC Powder.You may however, continue to take Tylenol if needed for pain up until the day of surgery.  Stop ANY OVER THE COUNTER supplements/vitamins NOW (09-13-21) until after surgery (Glucosamine-Chondroit-Vit C-Mn (GLUCOSAMINE 1500 COMPLEX) CAPS, Multiple Vitamin (MULTIVITAMIN WITH MINERALS) TABS tablet, and vitamin B-12 (CYANOCOBALAMIN) 1000 MCG tablet)  No Alcohol for 24 hours before or  after surgery.  No Smoking including e-cigarettes for 24 hours prior to surgery.  No chewable tobacco products for at least 6 hours prior to surgery.  No nicotine patches on the day of surgery.  Do not use any "recreational" drugs for at least a week prior to your surgery.  Please be advised that the combination of cocaine and anesthesia may have negative outcomes, up to and including death. If you test positive for cocaine, your surgery will be cancelled.  On the morning of surgery brush your teeth with toothpaste and water, you may rinse your mouth with mouthwash if you wish. Do not swallow any toothpaste or mouthwash.  Use CHG Soap  as directed on instruction sheet.  Do not wear jewelry, make-up, hairpins, clips or nail polish.  Do not wear lotions, powders, or perfumes.   Do not shave body from the neck down 48 hours prior to surgery just in case you cut yourself which could leave a site for infection.  Also, freshly shaved skin may become irritated if using the CHG soap.  Contact lenses, hearing aids and dentures may not be worn into surgery.  Do not bring valuables to the hospital. [facility] is not responsible for any missing/lost belongings or valuables.    Notify your doctor if there is any change in your medical condition (cold, fever, infection).  Wear comfortable clothing (specific to your surgery type) to the hospital.  After surgery, you can help prevent lung complications by doing breathing exercises.  Take deep breaths and cough every 1-2 hours. Your doctor may order a device called an Incentive Spirometer to help you take deep breaths. When  coughing or sneezing, hold a pillow firmly against your incision with both hands. This is called "splinting." Doing this helps protect your incision. It also decreases belly discomfort.  If you are being admitted to the hospital overnight, leave your suitcase in the car. After surgery it may be brought to your room.  If you  are being discharged the day of surgery, you will not be allowed to drive home. You will need a responsible adult (18 years or older) to drive you home and stay with you that night.   If you are taking public transportation, you will need to have a responsible adult (18 years or older) with you. Please confirm with your physician that it is acceptable to use public transportation.   Please call the [facility] [place] Dept. at [phone] if you have any questions about these instructions.  Surgery Visitation Policy:  Patients undergoing a surgery or procedure may have one family member or support person with them as long as that person is not COVID-19 positive or experiencing its symptoms.  That person may remain in the waiting area during the procedure and may rotate out with other people.  Inpatient Visitation:    Visiting hours are 7 a.m. to 8 p.m. Up to two visitors ages 16+ are allowed at one time in a patient room. The visitors may rotate out with other people during the day. Visitors must check out when they leave, or other visitors will not be allowed. One designated support person may remain overnight. The visitor must pass COVID-19 screenings, use hand sanitizer when entering and exiting the patient's room and wear a mask at all times, including in the patient's room. Patients must also wear a mask when staff or their visitor are in the room. Masking is required regardless of vaccination status.

## 2021-09-17 ENCOUNTER — Other Ambulatory Visit: Payer: Self-pay

## 2021-09-17 ENCOUNTER — Encounter
Admission: RE | Admit: 2021-09-17 | Discharge: 2021-09-17 | Disposition: A | Discharge status: Home / Self Care | Payer: Medicare Other | Source: Ambulatory Visit | Attending: Vascular Surgery | Admitting: Vascular Surgery

## 2021-09-17 ENCOUNTER — Other Ambulatory Visit: Payer: Medicare Other

## 2021-09-17 DIAGNOSIS — I120 Hypertensive chronic kidney disease with stage 5 chronic kidney disease or end stage renal disease: Secondary | ICD-10-CM | POA: Diagnosis not present

## 2021-09-17 DIAGNOSIS — Z01812 Encounter for preprocedural laboratory examination: Secondary | ICD-10-CM | POA: Diagnosis present

## 2021-09-17 DIAGNOSIS — N185 Chronic kidney disease, stage 5: Secondary | ICD-10-CM | POA: Insufficient documentation

## 2021-09-17 DIAGNOSIS — I10 Essential (primary) hypertension: Secondary | ICD-10-CM

## 2021-09-17 LAB — CBC WITH DIFFERENTIAL/PLATELET
Abs Immature Granulocytes: 0.04 10*3/uL (ref 0.00–0.07)
Basophils Absolute: 0.1 10*3/uL (ref 0.0–0.1)
Basophils Relative: 1 %
Eosinophils Absolute: 0.2 10*3/uL (ref 0.0–0.5)
Eosinophils Relative: 3 %
HCT: 30.1 % — ABNORMAL LOW (ref 36.0–46.0)
Hemoglobin: 9.6 g/dL — ABNORMAL LOW (ref 12.0–15.0)
Immature Granulocytes: 1 %
Lymphocytes Relative: 26 %
Lymphs Abs: 1.7 10*3/uL (ref 0.7–4.0)
MCH: 29.1 pg (ref 26.0–34.0)
MCHC: 31.9 g/dL (ref 30.0–36.0)
MCV: 91.2 fL (ref 80.0–100.0)
Monocytes Absolute: 0.3 10*3/uL (ref 0.1–1.0)
Monocytes Relative: 5 %
Neutro Abs: 4.2 10*3/uL (ref 1.7–7.7)
Neutrophils Relative %: 64 %
Platelets: 186 10*3/uL (ref 150–400)
RBC: 3.3 MIL/uL — ABNORMAL LOW (ref 3.87–5.11)
RDW: 12 % (ref 11.5–15.5)
WBC: 6.5 10*3/uL (ref 4.0–10.5)
nRBC: 0 % (ref 0.0–0.2)

## 2021-09-17 LAB — TYPE AND SCREEN
ABO/RH(D): A POS
Antibody Screen: NEGATIVE

## 2021-09-17 LAB — BASIC METABOLIC PANEL
Anion gap: 10 (ref 5–15)
BUN: 70 mg/dL — ABNORMAL HIGH (ref 6–20)
CO2: 24 mmol/L (ref 22–32)
Calcium: 8.3 mg/dL — ABNORMAL LOW (ref 8.9–10.3)
Chloride: 101 mmol/L (ref 98–111)
Creatinine, Ser: 3.85 mg/dL — ABNORMAL HIGH (ref 0.44–1.00)
GFR, Estimated: 13 mL/min — ABNORMAL LOW (ref >60)
Glucose, Bld: 327 mg/dL — ABNORMAL HIGH (ref 70–99)
Potassium: 4.2 mmol/L (ref 3.5–5.1)
Sodium: 135 mmol/L (ref 135–145)

## 2021-09-17 LAB — PROTIME-INR
INR: 1.1 (ref 0.8–1.2)
Prothrombin Time: 14.2 seconds (ref 11.4–15.2)

## 2021-09-17 LAB — APTT: aPTT: 32 seconds (ref 24–36)

## 2021-09-18 ENCOUNTER — Telehealth: Payer: Self-pay

## 2021-09-18 NOTE — Telephone Encounter (Signed)
   Name: Rachel Holmes DOB: 11-24-1960  MRN: [id number]  Primary Cardiologist: Ida Rogue, MD  Chart reviewed as part of pre-operative protocol coverage.   See recommendations for perioperative risk assessment by Leanor Kail, PA-C below. Internal message from Dr. Rockey Situ: "She can stop the aspirin for procedure 5 to 7 days if needed  We can discuss in follow-up visit whether she needs to be on aspirin at all  Thx  TG"  Recommendations: Based on ACC/AHA guidelines, the patient is at acceptable risk for the planned procedure and may proceed without further cardiovascular testing.  ASA can be held for 5-7 days prior to the procedure if needed and resumed post operatively when felt to be safe.    Please call with questions. Richardson Dopp, PA-C 09/18/2021, 8:32 AM

## 2021-09-18 NOTE — Telephone Encounter (Signed)
Notes routed to Honor Loh, NP with [employer]. This phone note will be removed from the preop pool. Richardson Dopp, PA-C  09/18/2021 8:40 AM

## 2021-09-18 NOTE — Telephone Encounter (Signed)
Please schedule pt for F/U appt before papers from [place] will be completed per Loanne Drilling:

## 2021-09-19 ENCOUNTER — Ambulatory Visit: Payer: Medicare Other | Admitting: Urgent Care

## 2021-09-19 ENCOUNTER — Ambulatory Visit
Admission: RE | Admit: 2021-09-19 | Discharge: 2021-09-19 | Disposition: A | Discharge status: Home / Self Care | Payer: Medicare Other | Attending: Vascular Surgery | Admitting: Vascular Surgery

## 2021-09-19 ENCOUNTER — Other Ambulatory Visit: Payer: Self-pay

## 2021-09-19 ENCOUNTER — Encounter: Payer: Self-pay | Admitting: Vascular Surgery

## 2021-09-19 ENCOUNTER — Ambulatory Visit: Payer: Medicare Other

## 2021-09-19 ENCOUNTER — Encounter
Admission: RE | Disposition: A | Discharge status: Home / Self Care | Payer: Self-pay | Source: Home / Self Care | Attending: Vascular Surgery

## 2021-09-19 DIAGNOSIS — E213 Hyperparathyroidism, unspecified: Secondary | ICD-10-CM | POA: Insufficient documentation

## 2021-09-19 DIAGNOSIS — Z6841 Body Mass Index (BMI) 40.0 and over, adult: Secondary | ICD-10-CM | POA: Diagnosis not present

## 2021-09-19 DIAGNOSIS — I350 Nonrheumatic aortic (valve) stenosis: Secondary | ICD-10-CM | POA: Insufficient documentation

## 2021-09-19 DIAGNOSIS — N185 Chronic kidney disease, stage 5: Secondary | ICD-10-CM

## 2021-09-19 DIAGNOSIS — F419 Anxiety disorder, unspecified: Secondary | ICD-10-CM | POA: Diagnosis not present

## 2021-09-19 DIAGNOSIS — E1122 Type 2 diabetes mellitus with diabetic chronic kidney disease: Secondary | ICD-10-CM | POA: Insufficient documentation

## 2021-09-19 DIAGNOSIS — N186 End stage renal disease: Secondary | ICD-10-CM | POA: Insufficient documentation

## 2021-09-19 DIAGNOSIS — E1151 Type 2 diabetes mellitus with diabetic peripheral angiopathy without gangrene: Secondary | ICD-10-CM | POA: Insufficient documentation

## 2021-09-19 DIAGNOSIS — I509 Heart failure, unspecified: Secondary | ICD-10-CM | POA: Diagnosis not present

## 2021-09-19 DIAGNOSIS — E785 Hyperlipidemia, unspecified: Secondary | ICD-10-CM | POA: Diagnosis not present

## 2021-09-19 DIAGNOSIS — Z89512 Acquired absence of left leg below knee: Secondary | ICD-10-CM | POA: Insufficient documentation

## 2021-09-19 DIAGNOSIS — H5462 Unqualified visual loss, left eye, normal vision right eye: Secondary | ICD-10-CM | POA: Insufficient documentation

## 2021-09-19 DIAGNOSIS — I272 Pulmonary hypertension, unspecified: Secondary | ICD-10-CM | POA: Diagnosis not present

## 2021-09-19 DIAGNOSIS — K219 Gastro-esophageal reflux disease without esophagitis: Secondary | ICD-10-CM | POA: Insufficient documentation

## 2021-09-19 DIAGNOSIS — I132 Hypertensive heart and chronic kidney disease with heart failure and with stage 5 chronic kidney disease, or end stage renal disease: Secondary | ICD-10-CM | POA: Insufficient documentation

## 2021-09-19 DIAGNOSIS — Z419 Encounter for procedure for purposes other than remedying health state, unspecified: Secondary | ICD-10-CM

## 2021-09-19 HISTORY — DX: Charcot's joint, left ankle and foot: M14.672

## 2021-09-19 HISTORY — DX: Chronic kidney disease, stage 3 unspecified: N18.30

## 2021-09-19 HISTORY — DX: Other complications of anesthesia, initial encounter: T88.59XA

## 2021-09-19 HISTORY — PX: AV FISTULA PLACEMENT: SHX1204

## 2021-09-19 HISTORY — DX: Type 2 diabetes mellitus without complications: E11.9

## 2021-09-19 HISTORY — DX: Acquired absence of left leg below knee: Z89.512

## 2021-09-19 LAB — GLUCOSE, CAPILLARY
Glucose-Capillary: 209 mg/dL — ABNORMAL HIGH (ref 70–99)
Glucose-Capillary: 194 mg/dL — ABNORMAL HIGH (ref 70–99)

## 2021-09-19 SURGERY — ARTERIOVENOUS (AV) FISTULA CREATION
Anesthesia: Regional | Laterality: Right

## 2021-09-19 MED ORDER — ROPIVACAINE HCL 5 MG/ML IJ SOLN
INTRAMUSCULAR | Status: DC | PRN
  Administered 2021-09-19: 30 mL

## 2021-09-19 MED ORDER — ROPIVACAINE HCL 5 MG/ML IJ SOLN
INTRAMUSCULAR | Status: AC
  Filled 2021-09-19: qty 30

## 2021-09-19 MED ORDER — ONDANSETRON HCL 4 MG/2ML IJ SOLN: 4.0000 mg | Freq: Once | INTRAMUSCULAR | Status: DC | PRN

## 2021-09-19 MED ORDER — LACTATED RINGERS IV SOLN: INTRAVENOUS | Status: DC

## 2021-09-19 MED ORDER — CHLORHEXIDINE GLUCONATE CLOTH 2 % EX PADS: 6.0000 | MEDICATED_PAD | Freq: Once | CUTANEOUS | Status: DC

## 2021-09-19 MED ORDER — FENTANYL CITRATE PF 50 MCG/ML IJ SOSY: 12.5000 ug | PREFILLED_SYRINGE | Freq: Once | INTRAMUSCULAR | Status: DC | PRN

## 2021-09-19 MED ORDER — PROPOFOL 500 MG/50ML IV EMUL
INTRAVENOUS | Status: DC | PRN
  Administered 2021-09-19: 50 ug/kg/min via INTRAVENOUS

## 2021-09-19 MED ORDER — PHENYLEPHRINE HCL (PRESSORS) 10 MG/ML IV SOLN
INTRAVENOUS | Status: DC | PRN
  Administered 2021-09-19 (×3): 100 ug via INTRAVENOUS

## 2021-09-19 MED ORDER — HEPARIN SODIUM (PORCINE) 5000 UNIT/ML IJ SOLN
INTRAMUSCULAR | Status: AC
  Filled 2021-09-19: qty 1

## 2021-09-19 MED ORDER — ONDANSETRON HCL 4 MG/2ML IJ SOLN: 4.0000 mg | Freq: Four times a day (QID) | INTRAMUSCULAR | Status: DC | PRN

## 2021-09-19 MED ORDER — MIDAZOLAM HCL 2 MG/2ML IJ SOLN
INTRAMUSCULAR | Status: AC
  Administered 2021-09-19: 1 mg via INTRAVENOUS
  Filled 2021-09-19: qty 2

## 2021-09-19 MED ORDER — PROPOFOL 500 MG/50ML IV EMUL
INTRAVENOUS | Status: AC
  Filled 2021-09-19: qty 50

## 2021-09-19 MED ORDER — ORAL CARE MOUTH RINSE: 15.0000 mL | Freq: Once | OROMUCOSAL | Status: DC

## 2021-09-19 MED ORDER — FENTANYL CITRATE (PF) 100 MCG/2ML IJ SOLN: 25.0000 ug | INTRAMUSCULAR | Status: DC | PRN

## 2021-09-19 MED ORDER — CHLORHEXIDINE GLUCONATE 0.12 % MT SOLN: 15.0000 mL | Freq: Once | OROMUCOSAL | Status: DC

## 2021-09-19 MED ORDER — FENTANYL CITRATE PF 50 MCG/ML IJ SOSY
PREFILLED_SYRINGE | INTRAMUSCULAR | Status: AC
  Administered 2021-09-19: 50 ug via INTRAVENOUS
  Filled 2021-09-19: qty 1

## 2021-09-19 MED ORDER — CHLORHEXIDINE GLUCONATE 0.12 % MT SOLN
OROMUCOSAL | Status: AC
  Filled 2021-09-19: qty 15

## 2021-09-19 MED ORDER — VANCOMYCIN HCL 1500 MG/300ML IV SOLN
1500.0000 mg | INTRAVENOUS | Status: AC
  Administered 2021-09-19: 1500 mg via INTRAVENOUS
  Filled 2021-09-19 (×2): qty 300

## 2021-09-19 MED ORDER — FENTANYL CITRATE PF 50 MCG/ML IJ SOSY: 100.0000 ug | PREFILLED_SYRINGE | Freq: Once | INTRAMUSCULAR | Status: AC

## 2021-09-19 MED ORDER — TRAMADOL HCL 50 MG PO TABS: 50.0000 mg | ORAL_TABLET | Freq: Four times a day (QID) | ORAL | 0 refills | Status: DC | PRN

## 2021-09-19 MED ORDER — APREPITANT 40 MG PO CAPS
ORAL_CAPSULE | ORAL | Status: AC
  Administered 2021-09-19: 40 mg via ORAL
  Filled 2021-09-19: qty 1

## 2021-09-19 MED ORDER — CEFAZOLIN SODIUM-DEXTROSE 2-4 GM/100ML-% IV SOLN
INTRAVENOUS | Status: AC
  Filled 2021-09-19: qty 100

## 2021-09-19 MED ORDER — MIDAZOLAM HCL 2 MG/2ML IJ SOLN: 1.0000 mg | Freq: Once | INTRAMUSCULAR | Status: AC

## 2021-09-19 MED ORDER — ACETAMINOPHEN 10 MG/ML IV SOLN: 1000.0000 mg | Freq: Once | INTRAVENOUS | Status: DC | PRN

## 2021-09-19 MED ORDER — APREPITANT 40 MG PO CAPS: 40.0000 mg | ORAL_CAPSULE | Freq: Once | ORAL | Status: AC

## 2021-09-19 MED ORDER — SODIUM CHLORIDE 0.9 % IV SOLN
INTRAVENOUS | Status: DC | PRN
  Administered 2021-09-19: 150 mL

## 2021-09-19 MED ORDER — SODIUM CHLORIDE 0.9 % IV SOLN: INTRAVENOUS | Status: DC

## 2021-09-19 SURGICAL SUPPLY — 55 items
ADH SKN CLS APL DERMABOND .7 (GAUZE/BANDAGES/DRESSINGS) ×1
APL PRP STRL LF DISP 70% ISPRP (MISCELLANEOUS) ×1
BAG DECANTER FOR FLEXI CONT (MISCELLANEOUS) ×3 IMPLANT
BLADE SURG SZ11 CARB STEEL (BLADE) ×3 IMPLANT
BOOT SUTURE AID YELLOW STND (SUTURE) ×3 IMPLANT
BRUSH SCRUB EZ  4% CHG (MISCELLANEOUS) ×3
BRUSH SCRUB EZ 4% CHG (MISCELLANEOUS) ×1 IMPLANT
CHLORAPREP W/TINT 26 (MISCELLANEOUS) ×3 IMPLANT
DERMABOND ADVANCED (GAUZE/BANDAGES/DRESSINGS) ×2
DERMABOND ADVANCED .7 DNX12 (GAUZE/BANDAGES/DRESSINGS) ×1 IMPLANT
DRESSING SURGICEL FIBRLLR 1X2 (HEMOSTASIS) ×1 IMPLANT
DRSG SURGICEL FIBRILLAR 1X2 (HEMOSTASIS) ×3
ELECT CAUTERY BLADE 6.4 (BLADE) ×3 IMPLANT
ELECT REM PT RETURN 9FT ADLT (ELECTROSURGICAL) ×3
ELECTRODE REM PT RTRN 9FT ADLT (ELECTROSURGICAL) ×1 IMPLANT
GAUZE 4X4 16PLY ~~LOC~~+RFID DBL (SPONGE) ×3 IMPLANT
GLOVE SURG ENC MOIS LTX SZ7 (GLOVE) ×3 IMPLANT
GLOVE SURG SYN 8.0 (GLOVE) ×3 IMPLANT
GLOVE SURG SYN 8.0 PF PI (GLOVE) ×1 IMPLANT
GLOVE SURG UNDER LTX SZ7.5 (GLOVE) ×3 IMPLANT
GOWN STRL REUS W/ TWL LRG LVL3 (GOWN DISPOSABLE) ×1 IMPLANT
GOWN STRL REUS W/ TWL XL LVL3 (GOWN DISPOSABLE) ×2 IMPLANT
GOWN STRL REUS W/TWL LRG LVL3 (GOWN DISPOSABLE) ×3
GOWN STRL REUS W/TWL XL LVL3 (GOWN DISPOSABLE) ×6
IV NS 500ML (IV SOLUTION) ×3
IV NS 500ML BAXH (IV SOLUTION) ×1 IMPLANT
KIT TURNOVER KIT A (KITS) ×3 IMPLANT
LABEL OR SOLS (LABEL) ×3 IMPLANT
LOOP RED MAXI  1X406MM (MISCELLANEOUS) ×2
LOOP VESSEL MAXI  1X406 RED (MISCELLANEOUS) ×1
LOOP VESSEL MAXI 1X406 RED (MISCELLANEOUS) ×1 IMPLANT
LOOP VESSEL MINI 0.8X406 BLUE (MISCELLANEOUS) ×2 IMPLANT
LOOPS BLUE MINI 0.8X406MM (MISCELLANEOUS) ×4
MANIFOLD NEPTUNE II (INSTRUMENTS) ×3 IMPLANT
NDL FILTER BLUNT 18X1 1/2 (NEEDLE) ×1 IMPLANT
NEEDLE FILTER BLUNT 18X 1/2SAF (NEEDLE) ×2
NEEDLE FILTER BLUNT 18X1 1/2 (NEEDLE) ×1 IMPLANT
PACK EXTREMITY ARMC (MISCELLANEOUS) ×3 IMPLANT
PAD PREP 24X41 OB/GYN DISP (PERSONAL CARE ITEMS) ×3 IMPLANT
STOCKINETTE 48X4 2 PLY STRL (GAUZE/BANDAGES/DRESSINGS) ×1 IMPLANT
STOCKINETTE STRL 4IN 9604848 (GAUZE/BANDAGES/DRESSINGS) ×3 IMPLANT
SUT MNCRL+ 5-0 UNDYED PC-3 (SUTURE) ×1 IMPLANT
SUT MONOCRYL 5-0 (SUTURE) ×3
SUT PROLENE 6 0 BV (SUTURE) ×12 IMPLANT
SUT SILK 2 0 (SUTURE) ×3
SUT SILK 2-0 18XBRD TIE 12 (SUTURE) ×1 IMPLANT
SUT SILK 3 0 (SUTURE) ×3
SUT SILK 3-0 18XBRD TIE 12 (SUTURE) ×1 IMPLANT
SUT SILK 4 0 (SUTURE) ×3
SUT SILK 4-0 18XBRD TIE 12 (SUTURE) ×1 IMPLANT
SUT VIC AB 3-0 SH 27 (SUTURE) ×3
SUT VIC AB 3-0 SH 27X BRD (SUTURE) ×1 IMPLANT
SYR 20ML LL LF (SYRINGE) ×3 IMPLANT
SYR 3ML LL SCALE MARK (SYRINGE) ×3 IMPLANT
WATER STERILE IRR 500ML POUR (IV SOLUTION) ×3 IMPLANT

## 2021-09-19 NOTE — Progress Notes (Signed)
Notified dr schnier that thrill and bruit both absent at this time. Was verified by another staff member.  Per Dr. Delana Meyer this is not unexpected r/t to her vasculature.

## 2021-09-19 NOTE — Interval H&P Note (Signed)
History and Physical Interval Note:  09/19/2021 10:44 AM  Rachel Holmes  has presented today for surgery, with the diagnosis of ESRD.  The various methods of treatment have been discussed with the patient and family. After consideration of risks, benefits and other options for treatment, the patient has consented to  Procedure(s): ARTERIOVENOUS (AV) FISTULA CREATION (BRACHIAL CEPHALIC) (Right) as a surgical intervention.  The patient's history has been reviewed, patient examined, no change in status, stable for surgery.  I have reviewed the patient's chart and labs.  Questions were answered to the patient's satisfaction.     [name]

## 2021-09-19 NOTE — Discharge Instructions (Signed)

## 2021-09-19 NOTE — H&P (Signed)
_0 @   MRN : [id number]  Rachel Holmes is a 60 y.o. (09/25/1961) female who presents with chief complaint of here for a fistula.  History of Present Illness:   Patient presents to [facility] today for creation of an AV fistula.  The patient has chronic renal insufficiency stage V secondary to hypertension and diabetes. The patient's most recent creatinine clearance is less than 20. The patient volume status has not yet become an issue. Patient's blood pressures been relatively well controlled. There are mild uremic symptoms which appear to be relatively well tolerated at this time. The patient is right-handed.   The patient has been considering the various methods of dialysis and has decided on hemodialysis and therefore creation of AV access is necessary.   The patient denies amaurosis fugax or recent TIA symptoms. There are no recent neurological changes noted. The patient denies claudication symptoms or rest pain symptoms. The patient denies history of DVT, PE or superficial thrombophlebitis. The patient denies recent episodes of angina or shortness of breath.     The patient has adequate vein diameters for creation of a left radiocephalic AV fistula.  Current Meds  Medication Sig   acetaminophen (TYLENOL) 500 MG tablet Take 1,000 mg by mouth every 6 (six) hours as needed.   albuterol (PROVENTIL HFA;VENTOLIN HFA) 108 (90 Base) MCG/ACT inhaler Inhale 1-2 puffs into the lungs every 6 (six) hours as needed for wheezing or shortness of breath.   aspirin EC 81 MG EC tablet Take 1 tablet (81 mg total) by mouth daily.   Blood Glucose Monitoring Suppl (ONE TOUCH ULTRA 2) w/Device KIT 1 Device by Does not apply route daily.   cetirizine (ZYRTEC) 10 MG tablet Take 10 mg by mouth at bedtime.   clobetasol cream (TEMOVATE) 3.71 % Apply 1 application topically 2 (two) times daily as needed (rash).   cloNIDine (CATAPRES) 0.3 MG tablet Take 1 tablet (0.3 mg total) by mouth 2  (two) times daily.   Continuous Blood Gluc Receiver (FREESTYLE LIBRE 14 DAY READER) DEVI USE AS DIRECTED   Continuous Blood Gluc Sensor (FREESTYLE LIBRE 14 DAY SENSOR) MISC USE DEVICE FOR 14 DAYS AS DIRECTED   docusate sodium (COLACE) 100 MG capsule Take 1 capsule (100 mg total) by mouth 2 (two) times daily.   doxazosin (CARDURA) 4 MG tablet Take 1 tablet (4 mg total) by mouth at bedtime.   DULoxetine (CYMBALTA) 30 MG capsule Take 30 mg by mouth at bedtime.   ezetimibe (ZETIA) 10 MG tablet Take 1 tablet (10 mg total) by mouth daily. (Patient taking differently: Take 10 mg by mouth at bedtime.)   fluticasone (FLONASE) 50 MCG/ACT nasal spray Place 1 spray into both nostrils daily as needed for allergies or rhinitis.   gabapentin (NEURONTIN) 600 MG tablet Take 600 mg by mouth at bedtime.   Glucosamine-Chondroit-Vit C-Mn (GLUCOSAMINE 1500 COMPLEX) CAPS Take 1 capsule by mouth 2 (two) times daily.   insulin lispro (HUMALOG KWIKPEN) 100 UNIT/ML KwikPen 3 times a day (just before each meal) 16-18-19 units, and pen needles 3 day. (Patient taking differently: Inject 24 Units into the skin 3 (three) times daily with meals.)   Insulin Pen Needle (BD PEN NEEDLE MICRO U/F) 32G X 6 MM MISC 1 each by Does not apply route 3 (three) times daily.   isosorbide mononitrate (IMDUR) 120 MG 24 hr tablet Take 1 tablet (120 mg total) by mouth daily. (Patient taking differently: Take 120 mg by mouth in the morning and at  bedtime.)   Lancets (ONETOUCH ULTRASOFT) lancets Used to check blood sugars four times daily.   Multiple Vitamin (MULTIVITAMIN WITH MINERALS) TABS tablet Take 1 tablet by mouth daily.   olmesartan (BENICAR) 40 MG tablet Take 40 mg by mouth at bedtime.   omeprazole (PRILOSEC) 40 MG capsule Take 40 mg by mouth 2 (two) times daily with breakfast and lunch.   ondansetron (ZOFRAN ODT) 4 MG disintegrating tablet Take 1 tablet (4 mg total) by mouth every 8 (eight) hours as needed for nausea or vomiting.   ONETOUCH  VERIO test strip USE TO CHECK BLOOD SUGAR TWICE DAILY   torsemide (DEMADEX) 100 MG tablet Take 100 mg by mouth every morning.   torsemide (DEMADEX) 20 MG tablet Take 1 tablet (20 mg total) by mouth 2 (two) times daily. Take an extra 20mg tablet daily prn in the morning for swelling or sob.   TRULICITY 3 MG/0.5ML SOPN INJECT 3MG SUBCUTANEOUSLY ONCE A WEEK (Patient taking differently: Sunday)   vitamin B-12 (CYANOCOBALAMIN) 1000 MCG tablet Take 1,000 mcg by mouth daily.   [DISCONTINUED] baclofen (LIORESAL) 10 MG tablet Take 10 mg by mouth every 8 (eight) hours as needed for muscle spasms.   [DISCONTINUED] glucosamine-chondroitin 500-400 MG tablet Take 1 tablet by mouth 2 (two) times daily.    Past Medical History:  Diagnosis Date   Allergic rhinitis    Allergy    Anemia    Anxiety    Aortic stenosis 09/13/2016   a.) TTE 09/13/2016: mild AS --> MPG 11.2 mmHg. b.) TTE 12/27/2020: EF 55-60%; no AS --> MPG 8 mmHg.   Aortic valve endocarditis 12/25/2018   a.) TTE 12/25/2018: 0.75 x 1 cm AV mass. b.) TEE 12/28/2018: small shaggy mobile density on aortic side of RIGHT coronary cusp consistent with vegetation.   Arthritis    Back pain    Bradycardia    Breast mass    Patient can no longer palpate specific masses but showed tech general area of concern   Charcot's joint of ankle, left    CHF (congestive heart failure) (HCC) 07/09/2007   a.) TTE 07/09/2007: EF 50%; G1DD. b.) TTE 09/13/2016: EF 55%; G2DD. c.) TTE 12/27/2020: EF 55-60%; G2DD; GLS -15.1%.   CKD (chronic kidney disease), stage III (HCC)    Complication of anesthesia    a.) PONV. b.) Delayed emergence   Constipation    Diabetic nephropathy (HCC)    Dyspnea    Family history of adverse reaction to anesthesia    a.) Sisters x 2 with (+) delayed emergence.   GERD (gastroesophageal reflux disease)    Heart murmur    HLD (hyperlipidemia)    Hyperparathyroidism (HCC)    Hypertension    Joint pain    Leg edema    Legally blind in  left eye, as defined in [place]    Lymphedema    Mild pulmonary hypertension (HCC) 09/13/2016   a.) TTE 09/13/2016: EF 55%; RVSP 45 mmHg. b.) TTE 12/25/2018: EF 55-60%; RVSP 35.5 mmHg.   Obesity    Onychomycosis    PONV (postoperative nausea and vomiting)    S/P BKA (below knee amputation) unilateral, left (HCC)    Sepsis (HCC) 12/2018   a.) group G streptococcal bacteremia with AV endocarditis and LLE soft tissue infection.   T2DM (type 2 diabetes mellitus) (HCC)     Past Surgical History:  Procedure Laterality Date   ABDOMINAL HYSTERECTOMY     AMPUTATION Left 05/05/2019   Procedure: AMPUTATION BELOW KNEE;  Surgeon: Schnier,   Gregory G, MD;  Location: [facility] ORS;  Service: Vascular;  Laterality: Left;   BREAST BIOPSY Left 2014   FNA 12:00 position - Negative   EYE SURGERY Left 2007   removed a lens, no lens implanted   IR FLUORO GUIDE CV LINE RIGHT  12/29/2018   IR REMOVAL TUN CV CATH W/O FL  02/19/2019   IR US GUIDE VASC ACCESS RIGHT  12/29/2018   TEE WITHOUT CARDIOVERSION N/A 12/28/2018   Procedure: TRANSESOPHAGEAL ECHOCARDIOGRAM (TEE);  Surgeon: Turner, Traci R, MD;  Location: [facility];  Service: Cardiovascular;  Laterality: N/A;    Social History Social History   Tobacco Use   Smoking status: Never   Smokeless tobacco: Never  Vaping Use   Vaping Use: Never used  Substance Use Topics   Alcohol use: No   Drug use: Never    Family History Family History  Problem Relation Age of Onset   Breast cancer Sister 58   Diabetes Sister    Diabetes Mother    Hypertension Mother    Hyperlipidemia Mother    Eating disorder Mother    Obesity Mother     Allergies  Allergen Reactions   Nifedipine Rash   Penicillins Hives, Shortness Of Breath and Swelling   Statins Shortness Of Breath    Wheezing   Hydralazine Itching   Hydrocodone Itching and Nausea Only   Oxycodone Nausea Only   Codeine Nausea Only   Ibuprofen Other (See Comments)    Raises blood pressure      REVIEW OF SYSTEMS (Negative unless checked)  Constitutional: []Weight loss  []Fever  []Chills Cardiac: []Chest pain   []Chest pressure   []Palpitations   []Shortness of breath when laying flat   []Shortness of breath with exertion. Vascular:  []Pain in legs with walking   []Pain in legs at rest  []History of DVT   []Phlebitis   []Swelling in legs   []Varicose veins   []Non-healing ulcers Pulmonary:   []Uses home oxygen   []Productive cough   []Hemoptysis   []Wheeze  []COPD   []Asthma Neurologic:  []Dizziness   []Seizures   []History of stroke   []History of TIA  []Aphasia   []Vissual changes   []Weakness or numbness in arm   []Weakness or numbness in leg Musculoskeletal:   []Joint swelling   []Joint pain   []Low back pain Hematologic:  []Easy bruising  []Easy bleeding   []Hypercoagulable state   []Anemic Gastrointestinal:  []Diarrhea   []Vomiting  []Gastroesophageal reflux/heartburn   []Difficulty swallowing. Genitourinary:  [x]Chronic kidney disease   []Difficult urination  []Frequent urination   []Blood in urine Skin:  []Rashes   []Ulcers  Psychological:  []History of anxiety   [] History of major depression.  Physical Examination  Vitals:   09/19/21 0955 09/19/21 1000 09/19/21 1005 09/19/21 1010  BP: 135/60 (!) 140/56 (!) 119/58 (!) 137/54  Pulse: 67 71 64 66  Resp: 13 11 10 10  Temp:      TempSrc:      SpO2: 100% 99% 99% 99%  Weight:      Height:       Body mass index is 45.61 kg/m. Gen: WD/WN, NAD Head: [place]/AT, No temporalis wasting.  Ear/Nose/Throat: Hearing grossly intact, nares w/o erythema or drainage Eyes: PER, EOMI, sclera nonicteric.  Neck: Supple, no gross masses or lesions.  No JVD.  Pulmonary:  Good air movement, no audible wheezing, no use of accessory muscles.  Cardiac: RRR, precordium non-hyperdynamic. Vascular:   Left   wrist is clean dry and intact Vessel Right Left  Radial Palpable Palpable  Brachial Palpable Palpable  Gastrointestinal: soft,  non-distended. No guarding/no peritoneal signs.  Musculoskeletal: M/S 5/5 throughout.  No deformity.  Neurologic: CN 2-12 intact. Pain and light touch intact in extremities.  Symmetrical.  Speech is fluent. Motor exam as listed above. Psychiatric: Judgment intact, Mood & affect appropriate for pt's clinical situation. Dermatologic: No rashes or ulcers noted.  No changes consistent with cellulitis.   CBC Lab Results  Component Value Date   WBC 6.5 09/17/2021   HGB 9.6 (L) 09/17/2021   HCT 30.1 (L) 09/17/2021   MCV 91.2 09/17/2021   PLT 186 09/17/2021    BMET    Component Value Date/Time   NA 135 09/17/2021 1339   NA 139 06/25/2018 1050   NA 137 11/21/2013 0627   K 4.2 09/17/2021 1339   K 4.6 11/21/2013 0627   CL 101 09/17/2021 1339   CL 107 11/21/2013 0627   CO2 24 09/17/2021 1339   CO2 24 11/21/2013 0627   GLUCOSE 327 (H) 09/17/2021 1339   GLUCOSE 95 11/21/2013 0627   BUN 70 (H) 09/17/2021 1339   BUN 43 (H) 06/25/2018 1050   BUN 34 (H) 11/21/2013 0627   CREATININE 3.85 (H) 09/17/2021 1339   CREATININE 1.80 (H) 09/09/2014 1220   CALCIUM 8.3 (L) 09/17/2021 1339   CALCIUM 8.8 09/09/2014 1220   GFRNONAA 13 (L) 09/17/2021 1339   GFRNONAA 31 (L) 09/09/2014 1220   GFRNONAA 26 (L) 11/21/2013 0627   GFRAA 17 (L) 05/28/2019 2252   GFRAA 38 (L) 09/09/2014 1220   GFRAA 30 (L) 11/21/2013 1610   Estimated Creatinine Clearance: 22.8 mL/min (A) (by C-G formula based on SCr of 3.85 mg/dL (H)).  COAG Lab Results  Component Value Date   INR 1.1 09/17/2021   INR 0.8 05/29/2019   INR 1.1 04/30/2019    Radiology [name] OR NERVE BLOCK-IMAGE ONLY [facility])  Result Date: 09/19/2021 There is no interpretation for this exam.  This order is for images obtained during a surgical procedure.  Please See "Surgeries" Tab for more information regarding the procedure.     Assessment/Plan Stage V renal insufficiency:  Recommend:  At this time the patient does not have appropriate extremity  access for dialysis  Patient should have a left radiocephalic fistula created.  The risks, benefits and alternative therapies were reviewed in detail with the patient.  All questions were answered.  The patient agrees to proceed with surgery.    2.  Hypertension: Continue antihypertensive medications as already ordered, these medications have been reviewed and there are no changes at this time.   3.  Diabetes mellitus: Continue hypoglycemic medications as already ordered, these medications have been reviewed and there are no changes at this time.  Hgb A1C to be monitored as already arranged by primary service    [name], MD  09/19/2021 10:26 AM

## 2021-09-19 NOTE — Anesthesia Postprocedure Evaluation (Signed)
Anesthesia Post Note  Patient: Rachel Holmes  Procedure(s) Performed: ARTERIOVENOUS (AV) FISTULA CREATION (BRACHIAL CEPHALIC) (Right)  Patient location during evaluation: PACU Anesthesia Type: Regional Level of consciousness: awake and alert Pain management: pain level controlled Vital Signs Assessment: post-procedure vital signs reviewed and stable Respiratory status: spontaneous breathing, nonlabored ventilation, respiratory function stable and patient connected to nasal cannula oxygen Cardiovascular status: blood pressure returned to baseline and stable Postop Assessment: no apparent nausea or vomiting Anesthetic complications: no   No notable events documented.   Last Vitals:  Vitals:   09/19/21 1330 09/19/21 1333  BP:  (!) 159/60  Pulse:  66  Resp:  20  Temp: (!) 36.1 C   SpO2:  98%    Last Pain:  Vitals:   09/19/21 1333  TempSrc:   PainSc: 0-No pain                 Arita Miss

## 2021-09-19 NOTE — Op Note (Signed)
OPERATIVE NOTE   PROCEDURE: right radiocephalic arteriovenous fistula placement  PRE-OPERATIVE DIAGNOSIS: End Stage Renal Disease  POST-OPERATIVE DIAGNOSIS: End Stage Renal Disease  SURGEON: [name]  ASSISTANT(S): None  ANESTHESIA: regional  ESTIMATED BLOOD LOSS: <50 cc  FINDING(S): Cephalic vein actually measured 2.5 mm the radial artery was densely calcified to the point that it was almost not clamped able  SPECIMEN(S):  none  INDICATIONS:   Rachel Holmes is a 60 y.o. female who presents with end stage renal disease.  The patient is scheduled for right brachiocephalic arteriovenous fistula placement.  The patient is aware the risks include but are not limited to: bleeding, infection, steal syndrome, nerve damage, ischemic monomelic neuropathy, failure to mature, and need for additional procedures.  The patient is aware of the risks of the procedure and elects to proceed forward.  DESCRIPTION: After full informed written consent was obtained from the patient, the patient was brought back to the operating room and placed supine upon the operating table.  Prior to induction, the patient received IV antibiotics.   After obtaining adequate anesthesia, the patient was then prepped and draped in the standard fashion for a right arm access procedure.   A first assistant was required to provide a safe and appropriate environment for executing the surgery.  The assistant was integral in providing retraction, exposure, running suture providing suction and in the closing process.   A linear incision was then created midway between the radial impulse and the cephalic vein. The cephalic vein was then identified and dissected circumferentially. It was marked with a surgical marker.    Attention was then turned to the radial artery which was exposed through the same incision and looped proximally and distally. Side branches were controlled with 4-0 silk ties.  The distal  segment of the vein was ligated with a  2-0 silk, and the vein was transected.  The proximal segment was interrogated with serial dilators.  The vein accepted up to a 2.5 mm dilator without any difficulty. Heparinized saline was infused into the vein and clamped it with a small bulldog.  At this point, I reset my exposure of the brachial artery and controlled the artery with vessel loops proximally and distally.  An arteriotomy was then made with a #11 blade, and extended with a Potts scissor.  Heparinized saline was injected proximal and distal into the radial artery.  The vein was then approximated to the artery while the artery was in its native bed and subsequently the vein was beveled using Potts scissors. The vein was then sewn to the artery in an end-to-side configuration with a running stitch of 6-0 Prolene.  Prior to completing this anastomosis Flushing maneuvers were performed and the artery was allowed to forward and back bleed.  There was no evidence of clot from any vessels.  I completed the anastomosis in the usual fashion and then released all vessel loops and clamps.    There was good  thrill in the venous outflow, and there was 1+ palpable radial pulse.  At this point, I irrigated out the surgical wound.  There was no further active bleeding.  The subcutaneous tissue was reapproximated with a running stitch of 3-0 Vicryl.  The skin was then reapproximated with a running subcuticular stitch of 4-0 Vicryl.  The skin was then cleaned, dried, and reinforced with Dermabond.    The patient tolerated this procedure well.   COMPLICATIONS: None  CONDITION: Rachel Holmes  [facility]  Office: [phone]   09/19/2021, 12:50 PM

## 2021-09-19 NOTE — Anesthesia Preprocedure Evaluation (Addendum)
Anesthesia Evaluation  Patient identified by MRN, date of birth, ID band Patient awake    Reviewed: Allergy & Precautions, NPO status , Patient's Chart, lab work & pertinent test results  History of Anesthesia Complications (+) PONV, PROLONGED EMERGENCE and history of anesthetic complications  Airway Mallampati: III   Neck ROM: Full    Dental  (+) Upper Dentures, Lower Dentures   Pulmonary neg pulmonary ROS,    Pulmonary exam normal breath sounds clear to auscultation       Cardiovascular hypertension, + Peripheral Vascular Disease (s/p left BKA) and +CHF  Normal cardiovascular exam+ Valvular Problems/Murmurs (mild AS, mild pulm HTN)  Rhythm:Regular Rate:Normal  Bradycardia; LE lymphedema  ECG 06/18/21: Rate: 65 bpm Rhythm: normal sinus Axis (leads I and aVF): Normal Intervals: PR 156 ms. QRS 88 ms. QTc 459 ms. ST segment and T wave changes: No evidence of acute ST segment elevation or depression Comparison: Similar to previous tracing obtained on 04/27/2021  TTE 01/24/21:  1. Left ventricular ejection fraction, by estimation, is 55 to 60%. The left ventricle has normal function. The left ventricle has no regional wall motion abnormalities. Left ventricular diastolic parameters are consistent with Grade II diastolic dysfunction (pseudonormalization). The average left ventricular global longitudinal strain is -15.1 %. The global longitudinal strain is abnormal.  2. Right ventricular systolic function is normal. The right ventricular size is normal.  3. Left atrial size was mildly dilated.  4. The mitral valve is normal in structure. Mild mitral valve regurgitation.  5. There is a 7 x 5 mm calcification noted on the posterior aspect (non coronary cusp) of the aortic valve. This is more consistent with a calcific mass. The aortic valve is calcified. Aortic valve regurgitation is not visualized. Mild to moderate aortic valve  sclerosis/calcification is present, without any evidence of aortic stenosis.  6. The inferior vena cava is dilated in size with <50% respiratory variability, suggesting right atrial pressure of 15 mmHg.    Neuro/Psych PSYCHIATRIC DISORDERS Anxiety Left eye blindness    GI/Hepatic GERD  ,  Endo/Other  diabetes, Type 2Hyperparathyroidism; class 3 obesity  Renal/GU Renal disease (stage V CKD)     Musculoskeletal   Abdominal   Peds  Hematology  (+) Blood dyscrasia, anemia ,   Anesthesia Other Findings Reviewed 06/18/21 cardiology note.  Reviewed and agree with Bayard Males pre-anesthesia clinical review note.  Reproductive/Obstetrics                            Anesthesia Physical Anesthesia Plan  ASA: 4  Anesthesia Plan: General and Regional   Post-op Pain Management:    Induction: Intravenous  PONV Risk Score and Plan: 4 or greater and Ondansetron, Propofol infusion, TIVA and Treatment may vary due to age or medical condition  Airway Management Planned: Natural Airway  Additional Equipment:   Intra-op Plan:   Post-operative Plan:   Informed Consent: I have reviewed the patients History and Physical, chart, labs and discussed the procedure including the risks, benefits and alternatives for the proposed anesthesia with the patient or authorized representative who has indicated his/her understanding and acceptance.       Plan Discussed with: CRNA  Anesthesia Plan Comments: (Plan for preoperative supraclavicular nerve block and GA with natural airway, LMA/GETA backup.  Patient consented for risks of anesthesia including but not limited to:  - adverse reactions to medications - damage to eyes, teeth, lips or other oral mucosa - nerve damage due  to positioning  - sore throat or hoarseness - damage to heart, brain, nerves, lungs, other parts of body or loss of life  Informed patient about role of CRNA in peri- and intra-operative care.   Patient voiced understanding.)       Anesthesia Quick Evaluation

## 2021-09-19 NOTE — Interval H&P Note (Signed)
History and Physical Interval Note:  09/19/2021 10:59 AM  Rachel Holmes  has presented today for surgery, with the diagnosis of ESRD.  The various methods of treatment have been discussed with the patient and family. After consideration of risks, benefits and other options for treatment, the patient has consented to  Procedure(s): ARTERIOVENOUS (AV) FISTULA CREATION (BRACHIAL CEPHALIC) (Right) as a surgical intervention.  The patient's history has been reviewed, patient examined, no change in status, stable for surgery.  I have reviewed the patient's chart and labs.  Questions were answered to the patient's satisfaction.     [name]

## 2021-09-19 NOTE — Transfer of Care (Signed)
Immediate Anesthesia Transfer of Care Note  Patient: Rachel Holmes  Procedure(s) Performed: ARTERIOVENOUS (AV) FISTULA CREATION (BRACHIAL CEPHALIC) (Right)  Patient Location: PACU  Anesthesia Type:MAC and Regional  Level of Consciousness: awake, alert  and oriented  Airway & Oxygen Therapy: Patient Spontanous Breathing and Patient connected to nasal cannula oxygen  Post-op Assessment: Report given to RN and Post -op Vital signs reviewed and stable  Post vital signs: Reviewed and stable  Last Vitals:  Vitals Value Taken Time  BP    Temp    Pulse    Resp    SpO2      Last Pain:  Vitals:   09/19/21 0930  TempSrc: Oral  PainSc: 2          Complications: No notable events documented.

## 2021-09-19 NOTE — H&P (Addendum)
This is an addendum to the updated H&P.  In my updated note I documented creation of a left radiocephalic fistula.  This is inaccurate; the patient has verified and the consent has been signed for a right radiocephalic fistula.  Review of the vein mapping shows that both the left and right cephalic veins are of the same size.  In all other aspects the H&P is accurate as it is documented.  I mistakenly transposed the left for the right.  We will move forward with creation of a right radiocephalic fistula which is also what the patient is expecting.

## 2021-09-19 NOTE — Anesthesia Procedure Notes (Signed)
Anesthesia Regional Block: Supraclavicular block   Pre-Anesthetic Checklist: , timeout performed,  Correct Patient, Correct Site, Correct Laterality,  Correct Procedure, Correct Position, risks and benefits discussed,  Surgical consent,  Pre-op evaluation,  At surgeon's request and post-op pain management  Laterality: Right  Prep: chloraprep       Needles:  Injection technique: Single-shot  Needle Type: Echogenic Stimulator Needle     Needle Length: 9cm  Needle Gauge: 21     Additional Needles:   Procedures:,,,, ultrasound used (permanent image in chart),,     Nerve Stimulator or Paresthesia:  Response: bicep contraction, 0.45 mA  Additional Responses:   Narrative:  Start time: 09/19/2021 9:58 AM End time: 09/19/2021 10:01 AM Injection made incrementally with aspirations every 5 mL.  Performed by: Personally   Additional Notes: Functioning IV was confirmed and monitors applied.  Sterile prep and drape, hand hygiene and sterile gloves were used. Ultrasound guidance: relevant anatomy identified, needle position confirmed, local anesthetic spread visualized around nerve(s), vascular puncture avoided.  Image printed for medical record.  Negative aspiration and negative test dose prior to incremental administration of local anesthetic. The patient tolerated the procedure well. Vital signs and moderate sedation medications recorded in RN notes.

## 2021-10-10 ENCOUNTER — Other Ambulatory Visit: Payer: Self-pay | Admitting: Endocrinology

## 2021-10-15 ENCOUNTER — Emergency Department
Admission: EM | Admit: 2021-10-15 | Discharge: 2021-10-15 | Disposition: A | Discharge status: Home / Self Care | Payer: Medicare Other | Attending: Emergency Medicine | Admitting: Emergency Medicine

## 2021-10-15 ENCOUNTER — Ambulatory Visit (INDEPENDENT_AMBULATORY_CARE_PROVIDER_SITE_OTHER): Payer: Medicare Other | Admitting: Vascular Surgery

## 2021-10-15 ENCOUNTER — Other Ambulatory Visit: Payer: Self-pay

## 2021-10-15 ENCOUNTER — Encounter (INDEPENDENT_AMBULATORY_CARE_PROVIDER_SITE_OTHER): Payer: Self-pay | Admitting: Vascular Surgery

## 2021-10-15 ENCOUNTER — Emergency Department: Payer: Medicare Other

## 2021-10-15 VITALS — BP 155/71 | HR 79 | Ht 66.0 in | Wt 300.0 lb

## 2021-10-15 DIAGNOSIS — Z7982 Long term (current) use of aspirin: Secondary | ICD-10-CM | POA: Diagnosis not present

## 2021-10-15 DIAGNOSIS — E1122 Type 2 diabetes mellitus with diabetic chronic kidney disease: Secondary | ICD-10-CM | POA: Insufficient documentation

## 2021-10-15 DIAGNOSIS — Z794 Long term (current) use of insulin: Secondary | ICD-10-CM

## 2021-10-15 DIAGNOSIS — E1151 Type 2 diabetes mellitus with diabetic peripheral angiopathy without gangrene: Secondary | ICD-10-CM

## 2021-10-15 DIAGNOSIS — K219 Gastro-esophageal reflux disease without esophagitis: Secondary | ICD-10-CM | POA: Diagnosis not present

## 2021-10-15 DIAGNOSIS — N184 Chronic kidney disease, stage 4 (severe): Secondary | ICD-10-CM | POA: Insufficient documentation

## 2021-10-15 DIAGNOSIS — N185 Chronic kidney disease, stage 5: Secondary | ICD-10-CM

## 2021-10-15 DIAGNOSIS — I10 Essential (primary) hypertension: Secondary | ICD-10-CM

## 2021-10-15 DIAGNOSIS — I130 Hypertensive heart and chronic kidney disease with heart failure and stage 1 through stage 4 chronic kidney disease, or unspecified chronic kidney disease: Secondary | ICD-10-CM | POA: Diagnosis not present

## 2021-10-15 DIAGNOSIS — I5032 Chronic diastolic (congestive) heart failure: Secondary | ICD-10-CM | POA: Diagnosis not present

## 2021-10-15 DIAGNOSIS — T829XXA Unspecified complication of cardiac and vascular prosthetic device, implant and graft, initial encounter: Secondary | ICD-10-CM

## 2021-10-15 DIAGNOSIS — R1032 Left lower quadrant pain: Secondary | ICD-10-CM | POA: Diagnosis present

## 2021-10-15 DIAGNOSIS — R1084 Generalized abdominal pain: Secondary | ICD-10-CM | POA: Diagnosis not present

## 2021-10-15 DIAGNOSIS — I890 Lymphedema, not elsewhere classified: Secondary | ICD-10-CM

## 2021-10-15 LAB — COMPREHENSIVE METABOLIC PANEL
ALT: 16 U/L (ref 0–44)
AST: 23 U/L (ref 15–41)
Albumin: 4.1 g/dL (ref 3.5–5.0)
Alkaline Phosphatase: 112 U/L (ref 38–126)
Anion gap: 9 (ref 5–15)
BUN: 75 mg/dL — ABNORMAL HIGH (ref 6–20)
CO2: 21 mmol/L — ABNORMAL LOW (ref 22–32)
Calcium: 8.7 mg/dL — ABNORMAL LOW (ref 8.9–10.3)
Chloride: 102 mmol/L (ref 98–111)
Creatinine, Ser: 3.58 mg/dL — ABNORMAL HIGH (ref 0.44–1.00)
GFR, Estimated: 14 mL/min — ABNORMAL LOW (ref >60)
Glucose, Bld: 125 mg/dL — ABNORMAL HIGH (ref 70–99)
Potassium: 4.9 mmol/L (ref 3.5–5.1)
Sodium: 132 mmol/L — ABNORMAL LOW (ref 135–145)
Total Bilirubin: 0.5 mg/dL (ref 0.3–1.2)
Total Protein: 8.2 g/dL — ABNORMAL HIGH (ref 6.5–8.1)

## 2021-10-15 LAB — URINALYSIS, ROUTINE W REFLEX MICROSCOPIC
Bilirubin Urine: NEGATIVE
Glucose, UA: NEGATIVE mg/dL
Hgb urine dipstick: NEGATIVE
Ketones, ur: NEGATIVE mg/dL
Leukocytes,Ua: NEGATIVE
Nitrite: NEGATIVE
Protein, ur: NEGATIVE mg/dL
Specific Gravity, Urine: 1.006 (ref 1.005–1.030)
pH: 5 (ref 5.0–8.0)

## 2021-10-15 LAB — CBC
HCT: 33.8 % — ABNORMAL LOW (ref 36.0–46.0)
Hemoglobin: 10.6 g/dL — ABNORMAL LOW (ref 12.0–15.0)
MCH: 29.4 pg (ref 26.0–34.0)
MCHC: 31.4 g/dL (ref 30.0–36.0)
MCV: 93.9 fL (ref 80.0–100.0)
Platelets: 192 10*3/uL (ref 150–400)
RBC: 3.6 MIL/uL — ABNORMAL LOW (ref 3.87–5.11)
RDW: 11.9 % (ref 11.5–15.5)
WBC: 7.7 10*3/uL (ref 4.0–10.5)
nRBC: 0 % (ref 0.0–0.2)

## 2021-10-15 LAB — LIPASE, BLOOD: Lipase: 72 U/L — ABNORMAL HIGH (ref 11–51)

## 2021-10-15 MED ORDER — FENTANYL CITRATE PF 50 MCG/ML IJ SOSY
25.0000 ug | PREFILLED_SYRINGE | Freq: Once | INTRAMUSCULAR | Status: AC
  Administered 2021-10-15: 25 ug via INTRAVENOUS
  Filled 2021-10-15: qty 1

## 2021-10-15 MED ORDER — TRAMADOL HCL 50 MG PO TABS: 50.0000 mg | ORAL_TABLET | Freq: Two times a day (BID) | ORAL | 0 refills | Status: DC

## 2021-10-15 MED ORDER — ONDANSETRON HCL 4 MG/2ML IJ SOLN
4.0000 mg | Freq: Once | INTRAMUSCULAR | Status: AC
  Administered 2021-10-15: 4 mg via INTRAVENOUS
  Filled 2021-10-15: qty 2

## 2021-10-15 MED ORDER — ONDANSETRON 4 MG PO TBDP
4.0000 mg | ORAL_TABLET | Freq: Once | ORAL | Status: AC
  Administered 2021-10-15: 4 mg via ORAL

## 2021-10-15 NOTE — H&P (View-Only) (Signed)
  MRN : [id number]  Rachel Holmes is a 60 y.o. (10/21/1961) female who presents with chief complaint of fistula.  History of Present Illness:   The patient returns the office today status post right wrist fistula creation on September 19, 2021.  The dialysis center reports that they have not been able to hear a bruit or palpate a thrill.  Otherwise the patient has no complaints.  She has not yet started on dialysis.  The patient notes that she is right-handed.  Patient underwent vein mapping August 09, 2021 at that time the cephalic vein in the antecubital fossa on the left was noted to be 3.8 mm.  Triphasic Doppler signals were also noted bilaterally.  Current Meds  Medication Sig   acetaminophen (TYLENOL) 500 MG tablet Take 1,000 mg by mouth every 6 (six) hours as needed.   albuterol (PROVENTIL HFA;VENTOLIN HFA) 108 (90 Base) MCG/ACT inhaler Inhale 1-2 puffs into the lungs every 6 (six) hours as needed for wheezing or shortness of breath.   aspirin EC 81 MG EC tablet Take 1 tablet (81 mg total) by mouth daily.   Blood Glucose Monitoring Suppl (ONE TOUCH ULTRA 2) w/Device KIT 1 Device by Does not apply route daily.   cetirizine (ZYRTEC) 10 MG tablet Take 10 mg by mouth at bedtime.   clobetasol cream (TEMOVATE) 0.05 % Apply 1 application topically 2 (two) times daily as needed (rash).   cloNIDine (CATAPRES) 0.3 MG tablet Take 1 tablet (0.3 mg total) by mouth 2 (two) times daily.   Continuous Blood Gluc Receiver (FREESTYLE LIBRE 14 DAY READER) DEVI USE AS DIRECTED   Continuous Blood Gluc Sensor (FREESTYLE LIBRE 14 DAY SENSOR) MISC USE DEVICE FOR 14 DAYS AS DIRECTED   docusate sodium (COLACE) 100 MG capsule Take 1 capsule (100 mg total) by mouth 2 (two) times daily.   doxazosin (CARDURA) 4 MG tablet Take 1 tablet (4 mg total) by mouth at bedtime.   DULoxetine (CYMBALTA) 30 MG capsule Take 30 mg by mouth at bedtime.   ezetimibe (ZETIA) 10 MG tablet Take 1 tablet (10 mg total) by mouth  daily. (Patient taking differently: Take 10 mg by mouth at bedtime.)   fluticasone (FLONASE) 50 MCG/ACT nasal spray Place 1 spray into both nostrils daily as needed for allergies or rhinitis.   gabapentin (NEURONTIN) 600 MG tablet Take 600 mg by mouth at bedtime.   Glucosamine-Chondroit-Vit C-Mn (GLUCOSAMINE 1500 COMPLEX) CAPS Take 1 capsule by mouth 2 (two) times daily.   insulin lispro (HUMALOG KWIKPEN) 100 UNIT/ML KwikPen 3 times a day (just before each meal) 16-18-19 units, and pen needles 3 day. (Patient taking differently: Inject 24 Units into the skin 3 (three) times daily with meals.)   Insulin Pen Needle (BD PEN NEEDLE MICRO U/F) 32G X 6 MM MISC 1 each by Does not apply route 3 (three) times daily.   isosorbide mononitrate (IMDUR) 120 MG 24 hr tablet Take 1 tablet (120 mg total) by mouth daily. (Patient taking differently: Take 120 mg by mouth in the morning and at bedtime.)   Lancets (ONETOUCH ULTRASOFT) lancets Used to check blood sugars four times daily.   methocarbamol (ROBAXIN) 750 MG tablet Take 1 tablet (750 mg total) by mouth every 12 (twelve) hours as needed for muscle spasms.   Multiple Vitamin (MULTIVITAMIN WITH MINERALS) TABS tablet Take 1 tablet by mouth daily.   ofloxacin (OCUFLOX) 0.3 % ophthalmic solution Place 1 drop into both eyes in the morning, at noon, in the evening,   and at bedtime.   olmesartan (BENICAR) 40 MG tablet Take 40 mg by mouth at bedtime.   omeprazole (PRILOSEC) 40 MG capsule Take 40 mg by mouth 2 (two) times daily with breakfast and lunch.   ondansetron (ZOFRAN ODT) 4 MG disintegrating tablet Take 1 tablet (4 mg total) by mouth every 8 (eight) hours as needed for nausea or vomiting.   ONETOUCH VERIO test strip USE  STRIP TO CHECK GLUCOSE TWICE DAILY   torsemide (DEMADEX) 100 MG tablet Take 100 mg by mouth every morning.   torsemide (DEMADEX) 20 MG tablet Take 1 tablet (20 mg total) by mouth 2 (two) times daily. Take an extra 20mg tablet daily prn in the  morning for swelling or sob.   traMADol (ULTRAM) 50 MG tablet Take 1 tablet (50 mg total) by mouth every 6 (six) hours as needed for moderate pain or severe pain.   TRULICITY 3 MG/0.5ML SOPN INJECT 3MG SUBCUTANEOUSLY ONCE A WEEK (Patient taking differently: Sunday)   vitamin B-12 (CYANOCOBALAMIN) 1000 MCG tablet Take 1,000 mcg by mouth daily.    Past Medical History:  Diagnosis Date   Allergic rhinitis    Allergy    Anemia    Anxiety    Aortic stenosis 09/13/2016   a.) TTE 09/13/2016: mild AS --> MPG 11.2 mmHg. b.) TTE 12/27/2020: EF 55-60%; no AS --> MPG 8 mmHg.   Aortic valve endocarditis 12/25/2018   a.) TTE 12/25/2018: 0.75 x 1 cm AV mass. b.) TEE 12/28/2018: small shaggy mobile density on aortic side of RIGHT coronary cusp consistent with vegetation.   Arthritis    Back pain    Bradycardia    Breast mass    Patient can no longer palpate specific masses but showed tech general area of concern   Charcot's joint of ankle, left    CHF (congestive heart failure) (HCC) 07/09/2007   a.) TTE 07/09/2007: EF 50%; G1DD. b.) TTE 09/13/2016: EF 55%; G2DD. c.) TTE 12/27/2020: EF 55-60%; G2DD; GLS -15.1%.   CKD (chronic kidney disease), stage III (HCC)    Complication of anesthesia    a.) PONV. b.) Delayed emergence   Constipation    Diabetic nephropathy (HCC)    Dyspnea    Family history of adverse reaction to anesthesia    a.) Sisters x 2 with (+) delayed emergence.   GERD (gastroesophageal reflux disease)    Heart murmur    HLD (hyperlipidemia)    Hyperparathyroidism (HCC)    Hypertension    Joint pain    Leg edema    Legally blind in left eye, as defined in [place]    Lymphedema    Mild pulmonary hypertension (HCC) 09/13/2016   a.) TTE 09/13/2016: EF 55%; RVSP 45 mmHg. b.) TTE 12/25/2018: EF 55-60%; RVSP 35.5 mmHg.   Obesity    Onychomycosis    PONV (postoperative nausea and vomiting)    S/P BKA (below knee amputation) unilateral, left (HCC)    Sepsis (HCC) 12/2018   a.) group G  streptococcal bacteremia with AV endocarditis and LLE soft tissue infection.   T2DM (type 2 diabetes mellitus) (HCC)     Past Surgical History:  Procedure Laterality Date   ABDOMINAL HYSTERECTOMY     AMPUTATION Left 05/05/2019   Procedure: AMPUTATION BELOW KNEE;  Surgeon: Cason Luffman G, MD;  Location: [facility];  Service: Vascular;  Laterality: Left;   AV FISTULA PLACEMENT Right 09/19/2021   Procedure: ARTERIOVENOUS (AV) FISTULA CREATION (BRACHIAL CEPHALIC);  Surgeon: Clance Baquero G, MD;  Location:   [facility];  Service: Vascular;  Laterality: Right;   BREAST BIOPSY Left 2014   FNA 12:00 position - Negative   EYE SURGERY Left 2007   removed a lens, no lens implanted   IR FLUORO GUIDE CV LINE RIGHT  12/29/2018   IR REMOVAL TUN CV CATH W/O FL  02/19/2019   IR US GUIDE VASC ACCESS RIGHT  12/29/2018   TEE WITHOUT CARDIOVERSION N/A 12/28/2018   Procedure: TRANSESOPHAGEAL ECHOCARDIOGRAM (TEE);  Surgeon: Turner, Traci R, MD;  Location: [facility];  Service: Cardiovascular;  Laterality: N/A;    Social History Social History   Tobacco Use   Smoking status: Never   Smokeless tobacco: Never  Vaping Use   Vaping Use: Never used  Substance Use Topics   Alcohol use: No   Drug use: Never    Family History Family History  Problem Relation Age of Onset   Breast cancer Sister 58   Diabetes Sister    Diabetes Mother    Hypertension Mother    Hyperlipidemia Mother    Eating disorder Mother    Obesity Mother     Allergies  Allergen Reactions   Nifedipine Rash   Penicillins Hives, Shortness Of Breath and Swelling   Statins Shortness Of Breath    Wheezing   Hydralazine Itching   Hydrocodone Itching and Nausea Only   Oxycodone Nausea Only   Codeine Nausea Only   Ibuprofen Other (See Comments)    Raises blood pressure     REVIEW OF SYSTEMS (Negative unless checked)  Constitutional: []Weight loss  []Fever  []Chills Cardiac: []Chest pain   []Chest pressure    []Palpitations   []Shortness of breath when laying flat   []Shortness of breath with exertion. Vascular:  []Pain in legs with walking   []Pain in legs at rest  []History of DVT   []Phlebitis   []Swelling in legs   []Varicose veins   []Non-healing ulcers Pulmonary:   []Uses home oxygen   []Productive cough   []Hemoptysis   []Wheeze  []COPD   []Asthma Neurologic:  []Dizziness   []Seizures   []History of stroke   []History of TIA  []Aphasia   []Vissual changes   []Weakness or numbness in arm   []Weakness or numbness in leg Musculoskeletal:   []Joint swelling   []Joint pain   []Low back pain Hematologic:  []Easy bruising  []Easy bleeding   []Hypercoagulable state   []Anemic Gastrointestinal:  []Diarrhea   []Vomiting  []Gastroesophageal reflux/heartburn   []Difficulty swallowing. Genitourinary:  [x]Chronic kidney disease   []Difficult urination  []Frequent urination   []Blood in urine Skin:  []Rashes   []Ulcers  Psychological:  []History of anxiety   [] History of major depression.  Physical Examination  Vitals:   10/15/21 0944  BP: (!) 155/71  Pulse: 79  Weight: 300 lb (136.1 kg)  Height: 5' 6" (1.676 m)   Body mass index is 48.42 kg/m. Gen: WD/WN, NAD Head: [place]/AT, No temporalis wasting.  Ear/Nose/Throat: Hearing grossly intact, nares w/o erythema or drainage Eyes: PER, EOMI, sclera nonicteric.  Neck: Supple, no gross masses or lesions.  No JVD.  Pulmonary:  Good air movement, no audible wheezing, no use of accessory muscles.  Cardiac: RRR, precordium non-hyperdynamic. Vascular:    There is no thrill no bruit overlying the right radiocephalic fistula.  Clearly this has failed.  Incision is well-healed. Vessel Right Left  Radial Not palpable Trace palpable  Brachial Palpable Palpable  Gastrointestinal: soft, non-distended. No guarding/no peritoneal signs.  Musculoskeletal:   M/S 5/5 throughout.  No deformity.  Neurologic: CN 2-12 intact. Pain and light touch intact in extremities.   Symmetrical.  Speech is fluent. Motor exam as listed above. Psychiatric: Judgment intact, Mood & affect appropriate for pt's clinical situation. Dermatologic: No rashes or ulcers noted.  No changes consistent with cellulitis.   CBC Lab Results  Component Value Date   WBC 6.5 09/17/2021   HGB 9.6 (L) 09/17/2021   HCT 30.1 (L) 09/17/2021   MCV 91.2 09/17/2021   PLT 186 09/17/2021    BMET    Component Value Date/Time   NA 135 09/17/2021 1339   NA 139 06/25/2018 1050   NA 137 11/21/2013 0627   K 4.2 09/17/2021 1339   K 4.6 11/21/2013 0627   CL 101 09/17/2021 1339   CL 107 11/21/2013 0627   CO2 24 09/17/2021 1339   CO2 24 11/21/2013 0627   GLUCOSE 327 (H) 09/17/2021 1339   GLUCOSE 95 11/21/2013 0627   BUN 70 (H) 09/17/2021 1339   BUN 43 (H) 06/25/2018 1050   BUN 34 (H) 11/21/2013 0627   CREATININE 3.85 (H) 09/17/2021 1339   CREATININE 1.80 (H) 09/09/2014 1220   CALCIUM 8.3 (L) 09/17/2021 1339   CALCIUM 8.8 09/09/2014 1220   GFRNONAA 13 (L) 09/17/2021 1339   GFRNONAA 31 (L) 09/09/2014 1220   GFRNONAA 26 (L) 11/21/2013 0627   GFRAA 17 (L) 05/28/2019 2252   GFRAA 38 (L) 09/09/2014 1220   GFRAA 30 (L) 11/21/2013 0627   CrCl cannot be calculated (Patient's most recent lab result is older than the maximum 21 days allowed.).  COAG Lab Results  Component Value Date   INR 1.1 09/17/2021   INR 0.8 05/29/2019   INR 1.1 04/30/2019    Radiology US OR NERVE BLOCK-IMAGE ONLY ([facility])  Result Date: 09/19/2021 There is no interpretation for this exam.  This order is for images obtained during a surgical procedure.  Please See "Surgeries" Tab for more information regarding the procedure.     Assessment/Plan 1. Chronic renal failure, stage 5 (HCC) Recommend:  At this time the patient does not have appropriate extremity access for dialysis.  Clearly the right radiocephalic fistula has failed.  I suspect that the profound calcification of the radial artery is the underlying issue  and do not believe that there is an option for salvage.  I have reviewed the original vein mapping with the patient.  The cephalic vein at the level of the left antecubital is 3.8 mm which should be reasonable for a fistula creation.  One of the concerns that led to attempts at creating a wrist fistula is her upper arm is quite large and there is a strong possibility that she will require multiple operations (i.e. creation and then potential superficialization) to achieve a fistula in the upper arm that is easily accessible.  Patient should have a left brachiocephalic fistula created and if for whatever reason the cephalic vein is not acceptable then a brachial axillary AV graft will be created.  Left arm is being selected as this is her nondominant arm.  The risks, benefits and alternative therapies were reviewed in detail with the patient.  All questions were answered.  The patient agrees to proceed with surgery.    2. Complication of vascular access for dialysis, initial encounter I do not believe the right fistula is salvageable and we will move forward with the plan as noted above  3. Lymphedema  No surgery or intervention at this point in time.      I have reviewed my previous discussion with the patient regarding swelling and why it  causes symptoms.  The patient is doing well with compression and will continue wearing graduated compression stockings class 1 (20-30 mmHg) on a daily basis a prescription was given. The patient will  continue wearing the stockings first thing in the morning and removing them in the evening. The patient is instructed specifically not to sleep in the stockings.    In addition, behavioral modification including elevation during the day and exercise will be continued.    Patient should follow-up on an annual basis    4. Essential hypertension Continue antihypertensive medications as already ordered, these medications have been reviewed and there are no changes at  this time.   5. Type 2 diabetes mellitus with diabetic peripheral angiopathy without gangrene, with long-term current use of insulin (HCC) Continue hypoglycemic medications as already ordered, these medications have been reviewed and there are no changes at this time.  Hgb A1C to be monitored as already arranged by primary service     Miley Blanchett, MD  10/15/2021 9:50 AM    

## 2021-10-15 NOTE — ED Provider Notes (Signed)
[facility] Emergency Department Provider Note   ____________________________________________   Event Date/Time   First MD Initiated Contact with Patient 10/15/21 1537     (approximate)  I have reviewed the triage vital signs and the nursing notes.   HISTORY  Chief Complaint Nausea and Abdominal Pain   HPI Rachel Holmes is a 60 y.o. female who reports she was at [facility] riding the cart when she began to get some right-sided flank crampy pain.  She had some nausea and vomiting with this.  She has no diarrhea with this.  The pain is now moving around her belly sometimes in the left mid abdomen sometimes left lower quadrant as well.  She has not had any diarrhea.  She has never had pancreatitis before.  Other past medical history is as below.  Pain is sharp at times other times dull and aching and as I noted moves around.        Past Medical History:  Diagnosis Date   Allergic rhinitis    Allergy    Anemia    Anxiety    Aortic stenosis 09/13/2016   a.) TTE 09/13/2016: mild AS --> MPG 11.2 mmHg. b.) TTE 12/27/2020: EF 55-60%; no AS --> MPG 8 mmHg.   Aortic valve endocarditis 12/25/2018   a.) TTE 12/25/2018: 0.75 x 1 cm AV mass. b.) TEE 12/28/2018: small shaggy mobile density on aortic side of RIGHT coronary cusp consistent with vegetation.   Arthritis    Back pain    Bradycardia    Breast mass    Patient can no longer palpate specific masses but showed tech general area of concern   Charcot's joint of ankle, left    CHF (congestive heart failure) ([place]) 07/09/2007   a.) TTE 07/09/2007: EF 50%; G1DD. b.) TTE 09/13/2016: EF 55%; G2DD. c.) TTE 12/27/2020: EF 55-60%; G2DD; GLS -15.1%.   CKD (chronic kidney disease), stage III (HCC)    Complication of anesthesia    a.) PONV. b.) Delayed emergence   Constipation    Diabetic nephropathy ([place])    Dyspnea    Family history of adverse reaction to anesthesia    a.) Sisters x 2 with (+) delayed emergence.    GERD (gastroesophageal reflux disease)    Heart murmur    HLD (hyperlipidemia)    Hyperparathyroidism ([place])    Hypertension    Joint pain    Leg edema    Legally blind in left eye, as defined in [place]    Lymphedema    Mild pulmonary hypertension ([place]) 09/13/2016   a.) TTE 09/13/2016: EF 55%; RVSP 45 mmHg. b.) TTE 12/25/2018: EF 55-60%; RVSP 35.5 mmHg.   Obesity    Onychomycosis    PONV (postoperative nausea and vomiting)    S/P BKA (below knee amputation) unilateral, left ([place])    Sepsis ([place]) 12/2018   a.) group G streptococcal bacteremia with AV endocarditis and LLE soft tissue infection.   T2DM (type 2 diabetes mellitus) [facility])     Patient Active Problem List   Diagnosis Date Noted   Urinary incontinence in female 07/21/2021   Hyposmolality and/or hyponatremia 06/18/2021   Type 2 diabetes mellitus with diabetic peripheral angiopathy without gangrene, with long-term current use of insulin ([place]) 12/01/2020   Difficulty walking 08/05/2020   Arthropathy of left shoulder 07/25/2020   Dysfunction of left rotator cuff 07/25/2020   Acidosis 09/20/2019   Benign hypertensive kidney disease with chronic kidney disease 09/20/2019   Proteinuria 09/20/2019   Secondary  hyperparathyroidism of renal origin ([place]) 09/20/2019   Cataract 09/02/2019   Chronic left shoulder pain 07/13/2019   Wound dehiscence 05/29/2019   Hx of BKA, left ([place]) 05/24/2019   PVD (peripheral vascular disease) ([place]) 05/13/2019   Below-knee amputation of left lower extremity ([place]) 05/13/2019   Chronic osteomyelitis of ankle and foot, left (HCC) 05/05/2019   Chronic diastolic CHF (congestive heart failure) ([place]) 04/19/2019   Leg edema 04/19/2019   Acute bacterial endocarditis 04/19/2019   Chest pain of uncertain etiology 63/14/9702   Ulcer of left ankle ([place]) 04/14/2019   Ankle deformity, left 01/21/2019   Diabetic polyneuropathy associated with type 2 diabetes mellitus ([place]) 01/21/2019   Aortic valve endocarditis     Bacteremia due to Streptococcus 12/24/2018   Diabetic ulcer of left ankle associated with diabetes mellitus due to underlying condition, with fat layer exposed ([employer]) 11/09/2018   Malignant hypertension (arteriolar nephrosclerosis), stage 1-4 or unspecified chronic kidney disease 07/15/2018   Stage 4 chronic kidney disease ([place]) 07/15/2018   Morbid obesity with BMI of 45.0-49.9, adult ([place]) 07/15/2018   Excessive daytime sleepiness 07/15/2018   Other fatigue 03/11/2018   Essential hypertension 03/11/2018   Vitamin D deficiency 03/11/2018   Congestive heart failure ([place]) 03/11/2018   Primary osteoarthritis of right knee 01/06/2018   Uncontrolled type 2 diabetes mellitus with polyneuropathy 10/03/2017   Chronic pain of right knee 10/02/2017   Obesity 10/02/2017   Charcot's joint of left foot 08/29/2017   Lymphedema 09/06/2016   Hyponatremia with extracellular fluid depletion 02/02/2016   Nausea with vomiting, unspecified 02/02/2016   Closed nondisplaced fracture of right patella 08/23/2015   Acute cystitis without hematuria 08/14/2014   Colitis 08/14/2014   HTN (hypertension), malignant 08/14/2014   Acute renal failure superimposed on stage 3 chronic kidney disease ([place]) 06/26/2014   Hyperparathyroidism, unspecified ([place]) 02/24/2014   Allergic rhinitis 02/22/2014   Onychomycosis 02/22/2014   Anxiety 11/05/2013   Bradycardia 05/14/2013   Gastro-esophageal reflux disease without esophagitis 04/05/2013   Anemia in other chronic diseases classified elsewhere 01/27/2013    Past Surgical History:  Procedure Laterality Date   ABDOMINAL HYSTERECTOMY     AMPUTATION Left 05/05/2019   Procedure: AMPUTATION BELOW KNEE;  Surgeon: Katha Cabal, MD;  Location: [facility] ORS;  Service: Vascular;  Laterality: Left;   AV FISTULA PLACEMENT Right 09/19/2021   Procedure: ARTERIOVENOUS (AV) FISTULA CREATION (BRACHIAL CEPHALIC);  Surgeon: Katha Cabal, MD;  Location: [facility] ORS;  Service:  Vascular;  Laterality: Right;   BREAST BIOPSY Left 2014   FNA 12:00 position - Negative   EYE SURGERY Left 2007   removed a lens, no lens implanted   IR FLUORO GUIDE CV LINE RIGHT  12/29/2018   IR REMOVAL TUN CV CATH W/O FL  02/19/2019   IR US GUIDE VASC ACCESS RIGHT  12/29/2018   TEE WITHOUT CARDIOVERSION N/A 12/28/2018   Procedure: TRANSESOPHAGEAL ECHOCARDIOGRAM (TEE);  Surgeon: Sueanne Margarita, MD;  Location: [facility] ENDOSCOPY;  Service: Cardiovascular;  Laterality: N/A;    Prior to Admission medications   Medication Sig Start Date End Date Taking? Authorizing Provider  traMADol (ULTRAM) 50 MG tablet Take 1 tablet (50 mg total) by mouth 2 (two) times daily. 10/15/21 10/15/22 Yes Nena Polio, MD  acetaminophen (TYLENOL) 500 MG tablet Take 1,000 mg by mouth every 6 (six) hours as needed.    [provider]  albuterol (PROVENTIL HFA;VENTOLIN HFA) 108 (90 Base) MCG/ACT inhaler Inhale 1-2 puffs into the lungs every 6 (six) hours as  needed for wheezing or shortness of breath.    [provider]  aspirin EC 81 MG EC tablet Take 1 tablet (81 mg total) by mouth daily. 05/11/19   Stegmayer, Joelene Millin A, PA-C  Blood Glucose Monitoring Suppl (ONE TOUCH ULTRA 2) w/Device KIT 1 Device by Does not apply route daily. 03/26/18   Renato Shin, MD  cetirizine (ZYRTEC) 10 MG tablet Take 10 mg by mouth at bedtime.    [provider]  clobetasol cream (TEMOVATE) 5.36 % Apply 1 application topically 2 (two) times daily as needed (rash).    [provider]  cloNIDine (CATAPRES) 0.3 MG tablet Take 1 tablet (0.3 mg total) by mouth 2 (two) times daily. 06/18/21   Minna Merritts, MD  Continuous Blood Gluc Receiver (FREESTYLE LIBRE 14 DAY READER) DEVI USE AS DIRECTED 07/13/20   Renato Shin, MD  Continuous Blood Gluc Sensor (FREESTYLE LIBRE 14 DAY SENSOR) MISC USE DEVICE FOR 14 DAYS AS DIRECTED 07/13/20   Renato Shin, MD  docusate sodium (COLACE) 100 MG capsule Take 1 capsule (100 mg  total) by mouth 2 (two) times daily. 05/10/19   Stegmayer, Joelene Millin A, PA-C  doxazosin (CARDURA) 4 MG tablet Take 1 tablet (4 mg total) by mouth at bedtime. 06/18/21   Minna Merritts, MD  DULoxetine (CYMBALTA) 30 MG capsule Take 30 mg by mouth at bedtime. 08/28/18   [provider]  ezetimibe (ZETIA) 10 MG tablet Take 1 tablet (10 mg total) by mouth daily. Patient taking differently: Take 10 mg by mouth at bedtime. 06/18/21   Minna Merritts, MD  fluticasone (FLONASE) 50 MCG/ACT nasal spray Place 1 spray into both nostrils daily as needed for allergies or rhinitis.    [provider]  gabapentin (NEURONTIN) 600 MG tablet Take 600 mg by mouth at bedtime.    [provider]  Glucosamine-Chondroit-Vit C-Mn (GLUCOSAMINE 1500 COMPLEX) CAPS Take 1 capsule by mouth 2 (two) times daily.    [provider]  insulin lispro (HUMALOG KWIKPEN) 100 UNIT/ML KwikPen 3 times a day (just before each meal) 16-18-19 units, and pen needles 3 day. Patient taking differently: Inject 24 Units into the skin 3 (three) times daily with meals. 07/13/20   Renato Shin, MD  Insulin Pen Needle (BD PEN NEEDLE MICRO U/F) 32G X 6 MM MISC 1 each by Does not apply route 3 (three) times daily. 03/15/19   Renato Shin, MD  isosorbide mononitrate (IMDUR) 120 MG 24 hr tablet Take 1 tablet (120 mg total) by mouth daily. Patient taking differently: Take 120 mg by mouth in the morning and at bedtime. 06/18/21   Minna Merritts, MD  Lancets [facility] ULTRASOFT) lancets Used to check blood sugars four times daily. 03/26/18   Renato Shin, MD  methocarbamol (ROBAXIN) 750 MG tablet Take 1 tablet (750 mg total) by mouth every 12 (twelve) hours as needed for muscle spasms. 06/22/20   Gillis Santa, MD  Multiple Vitamin (MULTIVITAMIN WITH MINERALS) TABS tablet Take 1 tablet by mouth daily.    [provider]  ofloxacin (OCUFLOX) 0.3 % ophthalmic solution Place 1 drop into both eyes in the morning, at  noon, in the evening, and at bedtime. 11/22/19   [provider]  olmesartan (BENICAR) 40 MG tablet Take 40 mg by mouth at bedtime.    [provider]  omeprazole (PRILOSEC) 40 MG capsule Take 40 mg by mouth 2 (two) times daily with breakfast and lunch.    [provider]  ondansetron [facility]  ODT) 4 MG disintegrating tablet Take 1 tablet (4 mg total) by mouth every 8 (eight) hours as needed for nausea or vomiting. 04/25/21   Naaman Plummer, MD  ONETOUCH VERIO test strip USE  STRIP TO CHECK GLUCOSE TWICE DAILY 10/10/21   Renato Shin, MD  torsemide (DEMADEX) 100 MG tablet Take 100 mg by mouth every morning.    [provider]  torsemide (DEMADEX) 20 MG tablet Take 1 tablet (20 mg total) by mouth 2 (two) times daily. Take an extra 100m tablet daily prn in the morning for swelling or sob. 06/18/21   GMinna Merritts MD  traMADol (ULTRAM) 50 MG tablet Take 1 tablet (50 mg total) by mouth every 6 (six) hours as needed for moderate pain or severe pain. 09/19/21   Schnier, GDolores Lory MD  TRULICITY 3 MBS/9.6GESOPN INJECT 3MG SUBCUTANEOUSLY ONCE A WEEK Patient taking differently: Sunday 12/29/20   GPhilemon Kingdom MD  vitamin B-12 (CYANOCOBALAMIN) 1000 MCG tablet Take 1,000 mcg by mouth daily.    [provider]    Allergies Nifedipine, Penicillins, Statins, Hydralazine, Hydrocodone, Oxycodone, Codeine, and Ibuprofen  Family History  Problem Relation Age of Onset   Breast cancer Sister 579  Diabetes Sister    Diabetes Mother    Hypertension Mother    Hyperlipidemia Mother    Eating disorder Mother    Obesity Mother     Social History Social History   Tobacco Use   Smoking status: Never   Smokeless tobacco: Never  Vaping Use   Vaping Use: Never used  Substance Use Topics   Alcohol use: No   Drug use: Never    Review of Systems  Constitutional: No fever/chills Eyes: No visual changes. ENT: No sore throat. Cardiovascular: Denies chest  pain. Respiratory: Denies shortness of breath. Gastrointestinal: abdominal pain.  nausea, vomiting.  No diarrhea.  No constipation. Genitourinary: Negative for dysuria. Musculoskeletal: Negative for back pain. Skin: Negative for rash. Neurological: Negative for headaches, focal weakness or numbness.   ____________________________________________   PHYSICAL EXAM:  VITAL SIGNS: ED Triage Vitals [10/15/21 1346]  Enc Vitals Group     BP (!) 183/71     Pulse Rate 68     Resp 18     Temp (!) 97.5 F (36.4 C)     Temp Source Oral     SpO2 98 %     Weight 300 lb (136.1 kg)     Height _0  (1.727 m)     Head Circumference      Peak Flow      Pain Score 5     Pain Loc      Pain Edu?      [place] in [place]     Constitutional: Alert and oriented. Well appearing and in no acute distress. Eyes: Conjunctivae are normal. PERRL. EOMI. Head: Atraumatic. Nose: No congestion/rhinnorhea. Mouth/Throat: Mucous membranes are moist.  Oropharynx non-erythematous. Neck: No stridor.  Cardiovascular: Normal rate, regular rhythm. Grossly normal heart sounds.  Good peripheral circulation. Respiratory: Normal respiratory effort.  No retractions. Lungs CTAB. Gastrointestinal: Soft mildly diffusely tender no distention. No abdominal bruits. No CVA tenderness. Musculoskeletal: No lower extremity tenderness nor edema.  No joint effusions.(Patient with left leg amputation) Neurologic:  Normal speech and language. No gross focal neurologic deficits are appreciated. No gait instability. Skin:  Skin is warm, dry and intact. No rash noted. Psychiatric: Mood and affect are normal. Speech and behavior are normal.  ____________________________________________   LABS (all labs  ordered are listed, but only abnormal results are displayed)  Labs Reviewed  LIPASE, BLOOD - Abnormal; Notable for the following components:      Result Value   Lipase 72 (*)    All other components within normal limits  COMPREHENSIVE  METABOLIC PANEL - Abnormal; Notable for the following components:   Sodium 132 (*)    CO2 21 (*)    Glucose, Bld 125 (*)    BUN 75 (*)    Creatinine, Ser 3.58 (*)    Calcium 8.7 (*)    Total Protein 8.2 (*)    GFR, Estimated 14 (*)    All other components within normal limits  CBC - Abnormal; Notable for the following components:   RBC 3.60 (*)    Hemoglobin 10.6 (*)    HCT 33.8 (*)    All other components within normal limits  URINALYSIS, ROUTINE W REFLEX MICROSCOPIC - Abnormal; Notable for the following components:   Color, Urine YELLOW (*)    APPearance HAZY (*)    All other components within normal limits   ____________________________________________  EKG   ____________________________________________  RADIOLOGY Gertha Calkin, personally viewed and evaluated these images (plain radiographs) as part of my medical decision making, as well as reviewing the written report by the radiologist.  ED MD interpretation: CT scan of the abdomen read by radiology reviewed by me shows no acute pathology.  Official radiology report(s): CT ABDOMEN PELVIS WO CONTRAST  Result Date: 10/15/2021 CLINICAL DATA:  Abdominal pain, acute, nonlocalized Abdominal pain with vomiting slightly elevated lipase patient has renal failure unable to do CT with contrast. Nausea. Right flank pain for 1 hour. EXAM: CT ABDOMEN AND PELVIS WITHOUT CONTRAST TECHNIQUE: Multidetector CT imaging of the abdomen and pelvis was performed following the standard protocol without IV contrast. COMPARISON:  12/26/2018 renal sonogram.  04/03/2007 CT pelvis. FINDINGS: Lower chest: No significant pulmonary nodules or acute consolidative airspace disease. Hepatobiliary: Normal liver size. No liver mass. Mildly distended gallbladder. No radiopaque cholelithiasis. No definite gallbladder wall thickening. No pericholecystic fluid. No biliary ductal dilatation. Pancreas: Normal, with no mass or duct dilation. Spleen: Normal size.  No mass. Adrenals/Urinary Tract: Normal adrenals. Scattered punctate 1-2 mm calcifications in the bilateral renal sinuses are nonspecific, favor vascular calcifications. No hydronephrosis. Simple scattered bilateral renal cysts, largest an exophytic simple 2.3 cm medial lower right renal cyst. Several hypodense and isodense subcentimeter bilateral renal cortical lesions, too small to characterize. Normal caliber ureters. No ureteral stones. Small cystocele. Otherwise normal urinary bladder. Stomach/Bowel: Small hiatal hernia. Otherwise normal nondistended stomach. Normal caliber small bowel with no small bowel wall thickening. Normal appendix. Moderate diffuse colonic stool volume. No large bowel wall thickening, diverticulosis or significant pericolonic fat stranding. Vascular/Lymphatic: Atherosclerotic nonaneurysmal abdominal aorta. Enlarged 1.3 cm right external iliac node (series 2/image 70). No additional pathologically enlarged lymph nodes in the abdomen or pelvis. Reproductive: Status post hysterectomy, with no abnormal findings at the vaginal cuff. No adnexal mass. Other: No pneumoperitoneum, ascites or focal fluid collection. Small fat containing periumbilical hernia. Musculoskeletal: No aggressive appearing focal osseous lesions. Moderate thoracolumbar spondylosis. IMPRESSION: 1. No evidence of bowel obstruction or acute bowel inflammation. Normal appendix. Moderate diffuse colonic stool volume, which may indicate constipation. 2. Nonspecific mild right external iliac lymphadenopathy, for which follow-up CT abdomen/pelvis with IV and oral contrast is recommended in 3 months. This recommendation follows ACR consensus guidelines: White Paper of the ACR Incidental Findings Committee II on Splenic and Nodal Findings. J Am  Coll Radiol [id number];97:[id number]. 3. Small cystocele. 4. Punctate calcifications throughout the bilateral renal sinuses, nonspecific, favor vascular calcifications. No hydronephrosis. No ureteral  or bladder stones. 5. Small hiatal hernia. 6. Aortic Atherosclerosis (ICD10-I70.0). Electronically Signed   By: Ilona Sorrel M.D.   On: 10/15/2021 17:01    ____________________________________________   PROCEDURES  Procedure(s) performed (including Critical Care):  Procedures   ____________________________________________   INITIAL IMPRESSION / ASSESSMENT AND PLAN / ED COURSE ----------------------------------------- 5:33 PM on 10/15/2021 -----------------------------------------  Patient with abdominal pain earlier but in the emergency department pain has resolved.  She is not having any pain now.  With her tests all essentially within normal limits except for the minimal elevation of lipase and the resolution of the pain nausea and vomiting I will let her go.  She will return if she has any further problems and follow-up with her doctor.  There is no sign of any colitis or diverticulitis or any other problems to include pancreatitis now.          ____________________________________________   FINAL CLINICAL IMPRESSION(S) / ED DIAGNOSES  Final diagnoses:  Generalized abdominal pain     ED Discharge Orders          Ordered    traMADol (ULTRAM) 50 MG tablet  2 times daily        10/15/21 1730             Note:  This document was prepared using Dragon voice recognition software and may include unintentional dictation errors.     Nena Polio, MD 10/15/21 [phone]

## 2021-10-15 NOTE — ED Triage Notes (Signed)
Pt here with nausea and right side flank pain that started 1 hour ago. Pt states that it feels like a dull cramp. Pt in NAD in triage.

## 2021-10-15 NOTE — ED Notes (Signed)
Pt with abd pain that started today. + bs. Pt states no changes in bowel or bladder. Pt with gallbladder. NAD

## 2021-10-15 NOTE — ED Provider Notes (Signed)
Emergency Medicine Provider Triage Evaluation Note  Rachel Holmes, a 60 y.o. female  was evaluated in triage.  Pt complains of an onset of right flank and right side abdominal pain 1 hour prior to arrival.  Patient reports a dull cramp and reports associated nausea and vomiting.  She denies a history of colitis or appendicitis.  She denies any urinary symptoms at this time.  Review of Systems  Positive: Right flank/RLQ pain , NV Negative: FCS, dysuria  Physical Exam  BP (!) 183/71 (BP Location: Left Arm)   Pulse 68   Temp (!) 97.5 F (36.4 C) (Oral)   Resp 18   Ht 5\' 8"  (1.727 m)   Wt 136.1 kg   SpO2 98%   BMI 45.61 kg/m  Gen:   Awake, no distress   Resp:  Normal effort CTA MSK:   Moves extremities without difficulty  Other:  ABD: soft, mildly tender to palp over the RLQ  Medical Decision Making  Medically screening exam initiated at 2:04 PM.  Appropriate orders placed.  TULANI KIDNEY was informed that the remainder of the evaluation will be completed by another provider, this initial triage assessment does not replace that evaluation, and the importance of remaining in the ED until their evaluation is complete.  Patient with ED evaluation of RLQ/flank tenderness with onset 1 hour ago.    Melvenia Needles, PA-C 10/15/21 1405    Arta Silence, MD 10/15/21 Sharilyn Sites

## 2021-10-15 NOTE — Progress Notes (Signed)
MRN : [id number]  Rachel Holmes is a 60 y.o. (1961/08/20) female who presents with chief complaint of fistula.  History of Present Illness:   The patient returns the office today status post right wrist fistula creation on September 19, 2021.  The dialysis center reports that they have not been able to hear a bruit or palpate a thrill.  Otherwise the patient has no complaints.  She has not yet started on dialysis.  The patient notes that she is right-handed.  Patient underwent vein mapping August 09, 2021 at that time the cephalic vein in the antecubital fossa on the left was noted to be 3.8 mm.  Triphasic Doppler signals were also noted bilaterally.  Current Meds  Medication Sig   acetaminophen (TYLENOL) 500 MG tablet Take 1,000 mg by mouth every 6 (six) hours as needed.   albuterol (PROVENTIL HFA;VENTOLIN HFA) 108 (90 Base) MCG/ACT inhaler Inhale 1-2 puffs into the lungs every 6 (six) hours as needed for wheezing or shortness of breath.   aspirin EC 81 MG EC tablet Take 1 tablet (81 mg total) by mouth daily.   Blood Glucose Monitoring Suppl (ONE TOUCH ULTRA 2) w/Device KIT 1 Device by Does not apply route daily.   cetirizine (ZYRTEC) 10 MG tablet Take 10 mg by mouth at bedtime.   clobetasol cream (TEMOVATE) 7.26 % Apply 1 application topically 2 (two) times daily as needed (rash).   cloNIDine (CATAPRES) 0.3 MG tablet Take 1 tablet (0.3 mg total) by mouth 2 (two) times daily.   Continuous Blood Gluc Receiver (FREESTYLE LIBRE 14 DAY READER) DEVI USE AS DIRECTED   Continuous Blood Gluc Sensor (FREESTYLE LIBRE 14 DAY SENSOR) MISC USE DEVICE FOR 14 DAYS AS DIRECTED   docusate sodium (COLACE) 100 MG capsule Take 1 capsule (100 mg total) by mouth 2 (two) times daily.   doxazosin (CARDURA) 4 MG tablet Take 1 tablet (4 mg total) by mouth at bedtime.   DULoxetine (CYMBALTA) 30 MG capsule Take 30 mg by mouth at bedtime.   ezetimibe (ZETIA) 10 MG tablet Take 1 tablet (10 mg total) by mouth  daily. (Patient taking differently: Take 10 mg by mouth at bedtime.)   fluticasone (FLONASE) 50 MCG/ACT nasal spray Place 1 spray into both nostrils daily as needed for allergies or rhinitis.   gabapentin (NEURONTIN) 600 MG tablet Take 600 mg by mouth at bedtime.   Glucosamine-Chondroit-Vit C-Mn (GLUCOSAMINE 1500 COMPLEX) CAPS Take 1 capsule by mouth 2 (two) times daily.   insulin lispro (HUMALOG KWIKPEN) 100 UNIT/ML KwikPen 3 times a day (just before each meal) 16-18-19 units, and pen needles 3 day. (Patient taking differently: Inject 24 Units into the skin 3 (three) times daily with meals.)   Insulin Pen Needle (BD PEN NEEDLE MICRO U/F) 32G X 6 MM MISC 1 each by Does not apply route 3 (three) times daily.   isosorbide mononitrate (IMDUR) 120 MG 24 hr tablet Take 1 tablet (120 mg total) by mouth daily. (Patient taking differently: Take 120 mg by mouth in the morning and at bedtime.)   Lancets (ONETOUCH ULTRASOFT) lancets Used to check blood sugars four times daily.   methocarbamol (ROBAXIN) 750 MG tablet Take 1 tablet (750 mg total) by mouth every 12 (twelve) hours as needed for muscle spasms.   Multiple Vitamin (MULTIVITAMIN WITH MINERALS) TABS tablet Take 1 tablet by mouth daily.   ofloxacin (OCUFLOX) 0.3 % ophthalmic solution Place 1 drop into both eyes in the morning, at noon, in the evening,  and at bedtime.   olmesartan (BENICAR) 40 MG tablet Take 40 mg by mouth at bedtime.   omeprazole (PRILOSEC) 40 MG capsule Take 40 mg by mouth 2 (two) times daily with breakfast and lunch.   ondansetron (ZOFRAN ODT) 4 MG disintegrating tablet Take 1 tablet (4 mg total) by mouth every 8 (eight) hours as needed for nausea or vomiting.   ONETOUCH VERIO test strip USE  STRIP TO CHECK GLUCOSE TWICE DAILY   torsemide (DEMADEX) 100 MG tablet Take 100 mg by mouth every morning.   torsemide (DEMADEX) 20 MG tablet Take 1 tablet (20 mg total) by mouth 2 (two) times daily. Take an extra 45m tablet daily prn in the  morning for swelling or sob.   traMADol (ULTRAM) 50 MG tablet Take 1 tablet (50 mg total) by mouth every 6 (six) hours as needed for moderate pain or severe pain.   TRULICITY 3 MYJ/8.5UDSOPN INJECT 3MG SUBCUTANEOUSLY ONCE A WEEK (Patient taking differently: Sunday)   vitamin B-12 (CYANOCOBALAMIN) 1000 MCG tablet Take 1,000 mcg by mouth daily.    Past Medical History:  Diagnosis Date   Allergic rhinitis    Allergy    Anemia    Anxiety    Aortic stenosis 09/13/2016   a.) TTE 09/13/2016: mild AS --> MPG 11.2 mmHg. b.) TTE 12/27/2020: EF 55-60%; no AS --> MPG 8 mmHg.   Aortic valve endocarditis 12/25/2018   a.) TTE 12/25/2018: 0.75 x 1 cm AV mass. b.) TEE 12/28/2018: small shaggy mobile density on aortic side of RIGHT coronary cusp consistent with vegetation.   Arthritis    Back pain    Bradycardia    Breast mass    Patient can no longer palpate specific masses but showed tech general area of concern   Charcot's joint of ankle, left    CHF (congestive heart failure) ([place] 07/09/[place]   a.) TTE 07/09/2007: EF 50%; G1DD. b.) TTE 09/13/2016: EF 55%; G2DD. c.) TTE 12/27/2020: EF 55-60%; G2DD; GLS -15.1%.   CKD (chronic kidney disease), stage III (HCC)    Complication of anesthesia    a.) PONV. b.) Delayed emergence   Constipation    Diabetic nephropathy ([place]    Dyspnea    Family history of adverse reaction to anesthesia    a.) Sisters x 2 with (+) delayed emergence.   GERD (gastroesophageal reflux disease)    Heart murmur    HLD (hyperlipidemia)    [place]parathyroidism ([place]    Hypertension    Joint pain    Leg edema    Legally blind in left eye, as defined [place]   Lymphedema    Mild pulmonary hypertension ([place] 09/13/2016   a.) TTE 09/13/2016: EF 55%; RVSP 45 mmHg. b.) TTE 12/25/2018: EF 55-60%; RVSP 35.5 mmHg.   Obesity    Onychomycosis    PONV (postoperative nausea and vomiting)    S/P BKA (below knee amputation) unilateral, left ([place]    Sepsis ([place] 12/2018   a.) group G  streptococcal bacteremia with AV endocarditis and LLE soft tissue infection.   T2DM (type 2 diabetes mellitus) ([place]     Past Surgical History:  Procedure Laterality Date   ABDOMINAL HYSTERECTOMY     AMPUTATION Left 05/05/2019   Procedure: AMPUTATION BELOW KNEE;  Surgeon: SKatha Cabal MD;  Location: [facility] ORS;  Service: Vascular;  Laterality: Left;   AV FISTULA PLACEMENT Right 09/19/2021   Procedure: ARTERIOVENOUS (AV) FISTULA CREATION (BRACHIAL CEPHALIC);  Surgeon: SKatha Cabal MD;  Location:  [facility] ORS;  Service: Vascular;  Laterality: Right;   BREAST BIOPSY Left 2014   FNA 12:00 position - Negative   EYE SURGERY Left 2007   removed a lens, no lens implanted   IR FLUORO GUIDE CV LINE RIGHT  12/29/2018   IR REMOVAL TUN CV CATH W/O FL  02/19/2019   IR US GUIDE VASC ACCESS RIGHT  12/29/2018   TEE WITHOUT CARDIOVERSION N/A 12/28/2018   Procedure: TRANSESOPHAGEAL ECHOCARDIOGRAM (TEE);  Surgeon: Sueanne Margarita, MD;  Location: [facility] ENDOSCOPY;  Service: Cardiovascular;  Laterality: N/A;    Social History Social History   Tobacco Use   Smoking status: Never   Smokeless tobacco: Never  Vaping Use   Vaping Use: Never used  Substance Use Topics   Alcohol use: No   Drug use: Never    Family History Family History  Problem Relation Age of Onset   Breast cancer Sister 74   Diabetes Sister    Diabetes Mother    Hypertension Mother    Hyperlipidemia Mother    Eating disorder Mother    Obesity Mother     Allergies  Allergen Reactions   Nifedipine Rash   Penicillins Hives, Shortness Of Breath and Swelling   Statins Shortness Of Breath    Wheezing   Hydralazine Itching   Hydrocodone Itching and Nausea Only   Oxycodone Nausea Only   Codeine Nausea Only   Ibuprofen Other (See Comments)    Raises blood pressure     REVIEW OF SYSTEMS (Negative unless checked)  Constitutional: _0 Weight loss  _1 Fever  _2 Chills Cardiac: _3 Chest pain   _4 Chest pressure    _5 Palpitations   _6 Shortness of breath when laying flat   _7 Shortness of breath with exertion. Vascular:  _8 Pain in legs with walking   _9 Pain in legs at rest  _10 History of DVT   _11 Phlebitis   _12 Swelling in legs   _13 Varicose veins   _14 Non-healing ulcers Pulmonary:   _15 Uses home oxygen   _16 Productive cough   _17 Hemoptysis   _18 Wheeze  _19 COPD   _20 Asthma Neurologic:  _21 Dizziness   _22 Seizures   _23 History of stroke   _24 History of TIA  _25 Aphasia   _26 Vissual changes   _27 Weakness or numbness in arm   _28 Weakness or numbness in leg Musculoskeletal:   _29 Joint swelling   _30 Joint pain   _31 Low back pain Hematologic:  _32 Easy bruising  _33 Easy bleeding   _34 Hypercoagulable state   _35 Anemic Gastrointestinal:  _36 Diarrhea   _37 Vomiting  _38 Gastroesophageal reflux/heartburn   _39 Difficulty swallowing. Genitourinary:  _40 Chronic kidney disease   _41 Difficult urination  _42 Frequent urination   _43 Blood in urine Skin:  _44 Rashes   _45 Ulcers  Psychological:  _46 History of anxiety   _47  History of major depression.  Physical Examination  Vitals:   10/15/21 0944  BP: (!) 155/71  Pulse: 79  Weight: 300 lb (136.1 kg)  Height: _48  (1.676 m)   Body mass index is 48.42 kg/m. Gen: WD/WN, NAD Head: [place]/AT, No temporalis wasting.  Ear/Nose/Throat: Hearing grossly intact, nares w/o erythema or drainage Eyes: PER, EOMI, sclera nonicteric.  Neck: Supple, no gross masses or lesions.  No JVD.  Pulmonary:  Good air movement, no audible wheezing, no use of accessory muscles.  Cardiac: RRR, precordium non-hyperdynamic. Vascular:    There is no thrill no bruit overlying the right radiocephalic fistula.  Clearly this has failed.  Incision is well-healed. Vessel Right Left  Radial Not palpable Trace palpable  Brachial Palpable Palpable  Gastrointestinal: soft, non-distended. No guarding/no peritoneal signs.  Musculoskeletal:  M/S 5/5 throughout.  No deformity.  Neurologic: CN 2-12 intact. Pain and light touch intact in extremities.   Symmetrical.  Speech is fluent. Motor exam as listed above. Psychiatric: Judgment intact, Mood & affect appropriate for pt's clinical situation. Dermatologic: No rashes or ulcers noted.  No changes consistent with cellulitis.   CBC Lab Results  Component Value Date   WBC 6.5 09/17/2021   HGB 9.6 (L) 09/17/2021   HCT 30.1 (L) 09/17/2021   MCV 91.2 09/17/2021   PLT 186 09/17/2021    BMET    Component Value Date/Time   NA 135 09/17/2021 1339   NA 139 06/25/2018 1050   NA 137 11/21/2013 0627   K 4.2 09/17/2021 1339   K 4.6 11/21/2013 0627   CL 101 09/17/2021 1339   CL 107 11/21/2013 0627   CO2 24 09/17/2021 1339   CO2 24 11/21/2013 0627   GLUCOSE 327 (H) 09/17/2021 1339   GLUCOSE 95 11/21/2013 0627   BUN 70 (H) 09/17/2021 1339   BUN 43 (H) 06/25/2018 1050   BUN 34 (H) 11/21/2013 0627   CREATININE 3.85 (H) 09/17/2021 1339   CREATININE 1.80 (H) 09/09/2014 1220   CALCIUM 8.3 (L) 09/17/2021 1339   CALCIUM 8.8 09/09/2014 1220   GFRNONAA 13 (L) 09/17/2021 1339   GFRNONAA 31 (L) 09/09/2014 1220   GFRNONAA 26 (L) 11/21/2013 0627   GFRAA 17 (L) 05/28/2019 2252   GFRAA 38 (L) 09/09/2014 1220   GFRAA 30 (L) 11/21/2013 0627   CrCl cannot be calculated (Patient's most recent lab result is older than the maximum 21 days allowed.).  COAG Lab Results  Component Value Date   INR 1.1 09/17/2021   INR 0.8 05/29/2019   INR 1.1 04/30/2019    Radiology [name] OR NERVE BLOCK-IMAGE ONLY [facility])  Result Date: 09/19/2021 There is no interpretation for this exam.  This order is for images obtained during a surgical procedure.  Please See "Surgeries" Tab for more information regarding the procedure.     Assessment/Plan 1. Chronic renal failure, stage 5 (HCC) Recommend:  At this time the patient does not have appropriate extremity access for dialysis.  Clearly the right radiocephalic fistula has failed.  I suspect that the profound calcification of the radial artery is the underlying issue  and do not believe that there is an option for salvage.  I have reviewed the original vein mapping with the patient.  The cephalic vein at the level of the left antecubital is 3.8 mm which should be reasonable for a fistula creation.  One of the concerns that led to attempts at creating a wrist fistula is her upper arm is quite large and there is a strong possibility that she will require multiple operations (i.e. creation and then potential superficialization) to achieve a fistula in the upper arm that is easily accessible.  Patient should have a left brachiocephalic fistula created and if for whatever reason the cephalic vein is not acceptable then a brachial axillary AV graft will be created.  Left arm is being selected as this is her nondominant arm.  The risks, benefits and alternative therapies were reviewed in detail with the patient.  All questions were answered.  The patient agrees to proceed with surgery.    2. Complication of vascular access for dialysis, initial encounter I do not believe the right fistula is salvageable and we will move forward with the plan as noted above  3. Lymphedema  No surgery or intervention at this point in time.  I have reviewed my previous discussion with the patient regarding swelling and why it  causes symptoms.  The patient is doing well with compression and will continue wearing graduated compression stockings class 1 (20-30 mmHg) on a daily basis a prescription was given. The patient will  continue wearing the stockings first thing in the morning and removing them in the evening. The patient is instructed specifically not to sleep in the stockings.    In addition, behavioral modification including elevation during the day and exercise will be continued.    Patient should follow-up on an annual basis    4. Essential hypertension Continue antihypertensive medications as already ordered, these medications have been reviewed and there are no changes at  this time.   5. Type 2 diabetes mellitus with diabetic peripheral angiopathy without gangrene, with long-term current use of insulin ([place]) Continue hypoglycemic medications as already ordered, these medications have been reviewed and there are no changes at this time.  Hgb A1C to be monitored as already arranged by primary service     [name], MD  10/15/2021 9:50 AM

## 2021-10-15 NOTE — Discharge Instructions (Addendum)
The test we have done today look okay.  No obvious reason is there for your belly pain.  I will give you a prescription for some tramadol.  You can take 1 twice a day if need be.  Please return for any worsening pain or vomiting or diarrhea or fever or any other problems.

## 2021-10-16 ENCOUNTER — Encounter (INDEPENDENT_AMBULATORY_CARE_PROVIDER_SITE_OTHER): Payer: Self-pay | Admitting: Vascular Surgery

## 2021-10-16 DIAGNOSIS — T829XXA Unspecified complication of cardiac and vascular prosthetic device, implant and graft, initial encounter: Secondary | ICD-10-CM | POA: Insufficient documentation

## 2021-10-19 ENCOUNTER — Telehealth (INDEPENDENT_AMBULATORY_CARE_PROVIDER_SITE_OTHER): Payer: Self-pay

## 2021-10-19 NOTE — Telephone Encounter (Signed)
Spoke with the patient and she is scheduled with Dr. Delana Meyer for a left brachial cephalic fistula on 44/58/48 at the [facility]. Pre-op phone call will be on 10/31/21 between 8-1 pm. Pre-surgical instructions were discussed and will be mailed.

## 2021-10-31 ENCOUNTER — Encounter
Admission: RE | Admit: 2021-10-31 | Discharge: 2021-10-31 | Disposition: A | Discharge status: Home / Self Care | Payer: Medicare Other | Source: Ambulatory Visit | Attending: Vascular Surgery | Admitting: Vascular Surgery

## 2021-10-31 ENCOUNTER — Other Ambulatory Visit (INDEPENDENT_AMBULATORY_CARE_PROVIDER_SITE_OTHER): Payer: Self-pay | Admitting: Nurse Practitioner

## 2021-10-31 ENCOUNTER — Other Ambulatory Visit: Payer: Self-pay

## 2021-10-31 DIAGNOSIS — N185 Chronic kidney disease, stage 5: Secondary | ICD-10-CM | POA: Diagnosis not present

## 2021-10-31 DIAGNOSIS — Z01810 Encounter for preprocedural cardiovascular examination: Secondary | ICD-10-CM | POA: Diagnosis not present

## 2021-10-31 DIAGNOSIS — Z01818 Encounter for other preprocedural examination: Secondary | ICD-10-CM | POA: Insufficient documentation

## 2021-10-31 LAB — BASIC METABOLIC PANEL
Anion gap: 8 (ref 5–15)
BUN: 73 mg/dL — ABNORMAL HIGH (ref 6–20)
CO2: 25 mmol/L (ref 22–32)
Calcium: 8.7 mg/dL — ABNORMAL LOW (ref 8.9–10.3)
Chloride: 102 mmol/L (ref 98–111)
Creatinine, Ser: 3.54 mg/dL — ABNORMAL HIGH (ref 0.44–1.00)
GFR, Estimated: 14 mL/min — ABNORMAL LOW (ref >60)
Glucose, Bld: 199 mg/dL — ABNORMAL HIGH (ref 70–99)
Potassium: 4.3 mmol/L (ref 3.5–5.1)
Sodium: 135 mmol/L (ref 135–145)

## 2021-10-31 LAB — CBC WITH DIFFERENTIAL/PLATELET
Abs Immature Granulocytes: 0.02 10*3/uL (ref 0.00–0.07)
Basophils Absolute: 0 10*3/uL (ref 0.0–0.1)
Basophils Relative: 1 %
Eosinophils Absolute: 0.1 10*3/uL (ref 0.0–0.5)
Eosinophils Relative: 2 %
HCT: 28.7 % — ABNORMAL LOW (ref 36.0–46.0)
Hemoglobin: 9.3 g/dL — ABNORMAL LOW (ref 12.0–15.0)
Immature Granulocytes: 0 %
Lymphocytes Relative: 26 %
Lymphs Abs: 1.8 10*3/uL (ref 0.7–4.0)
MCH: 29.5 pg (ref 26.0–34.0)
MCHC: 32.4 g/dL (ref 30.0–36.0)
MCV: 91.1 fL (ref 80.0–100.0)
Monocytes Absolute: 0.4 10*3/uL (ref 0.1–1.0)
Monocytes Relative: 6 %
Neutro Abs: 4.6 10*3/uL (ref 1.7–7.7)
Neutrophils Relative %: 65 %
Platelets: 219 10*3/uL (ref 150–400)
RBC: 3.15 MIL/uL — ABNORMAL LOW (ref 3.87–5.11)
RDW: 12 % (ref 11.5–15.5)
WBC: 6.9 10*3/uL (ref 4.0–10.5)
nRBC: 0 % (ref 0.0–0.2)

## 2021-10-31 LAB — TYPE AND SCREEN
ABO/RH(D): A POS
Antibody Screen: NEGATIVE

## 2021-10-31 NOTE — Patient Instructions (Addendum)
Your procedure is scheduled on: Friday, January 6 Report to the Registration Desk on the 1st floor of the [facility]. To find out your arrival time, please call [phone] between 1PM - 3PM on: Thursday, January 5  REMEMBER: Instructions that are not followed completely may result in serious medical risk, up to and including death; or upon the discretion of your surgeon and anesthesiologist your surgery may need to be rescheduled.  Do not eat food after midnight the night before surgery.  No gum chewing, lozengers or hard candies.  You may however, drink water up to 2 hours before you are scheduled to arrive for your surgery. Do not drink anything within 2 hours of your scheduled arrival time.  TAKE THESE MEDICATIONS THE MORNING OF SURGERY WITH A SIP OF WATER:  Albuterol inhaler Clonidine Omeprazole Tramadol as needed for pain  Use inhalers on the day of surgery and bring to the hospital.  Do not take any insulin on the morning of surgery.  One week prior to surgery: starting December 30 Stop Anti-inflammatories (NSAIDS) such as Advil, Aleve, Ibuprofen, Motrin, Naproxen, Naprosyn and Aspirin based products such as Excedrin, Goodys Powder, BC Powder. Stop ANY OVER THE COUNTER supplements until after surgery. You may however, continue to take Tylenol if needed for pain up until the day of surgery.  No Alcohol for 24 hours before or after surgery.  No Smoking including e-cigarettes for 24 hours prior to surgery.  No chewable tobacco products for at least 6 hours prior to surgery.  No nicotine patches on the day of surgery.  Do not use any "recreational" drugs for at least a week prior to your surgery.  Please be advised that the combination of cocaine and anesthesia may have negative outcomes, up to and including death. If you test positive for cocaine, your surgery will be cancelled.  On the morning of surgery brush your teeth with toothpaste and water, you may rinse your  mouth with mouthwash if you wish. Do not swallow any toothpaste or mouthwash.  Use CHG Soap as directed on instruction sheet.  Do not wear jewelry, make-up, hairpins, clips or nail polish.  Do not wear lotions, powders, or perfumes.   Do not shave body from the neck down 48 hours prior to surgery just in case you cut yourself which could leave a site for infection.  Also, freshly shaved skin may become irritated if using the CHG soap.  Contact lenses, hearing aids and dentures may not be worn into surgery.  Do not bring valuables to the hospital. [facility] is not responsible for any missing/lost belongings or valuables.   Notify your doctor if there is any change in your medical condition (cold, fever, infection).  Wear comfortable clothing (specific to your surgery type) to the hospital.  After surgery, you can help prevent lung complications by doing breathing exercises.  Take deep breaths and cough every 1-2 hours. Your doctor may order a device called an Incentive Spirometer to help you take deep breaths.  If you are being discharged the day of surgery, you will not be allowed to drive home. You will need a responsible adult (18 years or older) to drive you home and stay with you that night.   If you are taking public transportation, you will need to have a responsible adult (18 years or older) with you. Please confirm with your physician that it is acceptable to use public transportation.   Please call the [facility] Dept[facility] at [phone] if you have any questions about these instructions.  Surgery Visitation Policy:  Patients undergoing a surgery or procedure may have one family member or support person with them as long as that person is not COVID-19 positive or experiencing its symptoms.  That person may remain in the waiting area during the procedure and may rotate out with other people.

## 2021-11-09 ENCOUNTER — Ambulatory Visit: Payer: Medicare Other

## 2021-11-09 ENCOUNTER — Encounter
Admission: RE | Disposition: A | Discharge status: Home / Self Care | Payer: Self-pay | Source: Home / Self Care | Attending: Vascular Surgery

## 2021-11-09 ENCOUNTER — Encounter: Payer: Self-pay | Admitting: Vascular Surgery

## 2021-11-09 ENCOUNTER — Other Ambulatory Visit: Payer: Self-pay

## 2021-11-09 ENCOUNTER — Ambulatory Visit: Payer: Medicare Other | Admitting: Anesthesiology

## 2021-11-09 ENCOUNTER — Ambulatory Visit
Admission: RE | Admit: 2021-11-09 | Discharge: 2021-11-09 | Disposition: A | Discharge status: Home / Self Care | Payer: Medicare Other | Attending: Vascular Surgery | Admitting: Vascular Surgery

## 2021-11-09 DIAGNOSIS — N186 End stage renal disease: Secondary | ICD-10-CM | POA: Diagnosis not present

## 2021-11-09 DIAGNOSIS — K219 Gastro-esophageal reflux disease without esophagitis: Secondary | ICD-10-CM | POA: Diagnosis not present

## 2021-11-09 DIAGNOSIS — E213 Hyperparathyroidism, unspecified: Secondary | ICD-10-CM | POA: Diagnosis not present

## 2021-11-09 DIAGNOSIS — E1151 Type 2 diabetes mellitus with diabetic peripheral angiopathy without gangrene: Secondary | ICD-10-CM | POA: Diagnosis not present

## 2021-11-09 DIAGNOSIS — I890 Lymphedema, not elsewhere classified: Secondary | ICD-10-CM | POA: Diagnosis not present

## 2021-11-09 DIAGNOSIS — I132 Hypertensive heart and chronic kidney disease with heart failure and with stage 5 chronic kidney disease, or end stage renal disease: Secondary | ICD-10-CM | POA: Insufficient documentation

## 2021-11-09 DIAGNOSIS — Z7984 Long term (current) use of oral hypoglycemic drugs: Secondary | ICD-10-CM | POA: Diagnosis not present

## 2021-11-09 DIAGNOSIS — E1122 Type 2 diabetes mellitus with diabetic chronic kidney disease: Secondary | ICD-10-CM | POA: Insufficient documentation

## 2021-11-09 DIAGNOSIS — I509 Heart failure, unspecified: Secondary | ICD-10-CM | POA: Diagnosis not present

## 2021-11-09 DIAGNOSIS — R1110 Vomiting, unspecified: Secondary | ICD-10-CM | POA: Insufficient documentation

## 2021-11-09 DIAGNOSIS — Z419 Encounter for procedure for purposes other than remedying health state, unspecified: Secondary | ICD-10-CM

## 2021-11-09 DIAGNOSIS — R112 Nausea with vomiting, unspecified: Secondary | ICD-10-CM | POA: Diagnosis not present

## 2021-11-09 DIAGNOSIS — N185 Chronic kidney disease, stage 5: Secondary | ICD-10-CM | POA: Diagnosis not present

## 2021-11-09 DIAGNOSIS — F419 Anxiety disorder, unspecified: Secondary | ICD-10-CM | POA: Diagnosis not present

## 2021-11-09 DIAGNOSIS — Z6841 Body Mass Index (BMI) 40.0 and over, adult: Secondary | ICD-10-CM | POA: Diagnosis not present

## 2021-11-09 HISTORY — PX: AV FISTULA PLACEMENT: SHX1204

## 2021-11-09 LAB — POCT I-STAT, CHEM 8
BUN: 76 mg/dL — ABNORMAL HIGH (ref 6–20)
Calcium, Ion: 1.11 mmol/L — ABNORMAL LOW (ref 1.15–1.40)
Chloride: 98 mmol/L (ref 98–111)
Creatinine, Ser: 4.6 mg/dL — ABNORMAL HIGH (ref 0.44–1.00)
Glucose, Bld: 237 mg/dL — ABNORMAL HIGH (ref 70–99)
HCT: 30 % — ABNORMAL LOW (ref 36.0–46.0)
Hemoglobin: 10.2 g/dL — ABNORMAL LOW (ref 12.0–15.0)
Potassium: 4.3 mmol/L (ref 3.5–5.1)
Sodium: 134 mmol/L — ABNORMAL LOW (ref 135–145)
TCO2: 24 mmol/L (ref 22–32)

## 2021-11-09 LAB — GLUCOSE, CAPILLARY: Glucose-Capillary: 204 mg/dL — ABNORMAL HIGH (ref 70–99)

## 2021-11-09 SURGERY — ARTERIOVENOUS (AV) FISTULA CREATION
Anesthesia: Regional | Laterality: Left

## 2021-11-09 MED ORDER — CHLORHEXIDINE GLUCONATE 0.12 % MT SOLN
15.0000 mL | Freq: Once | OROMUCOSAL | Status: AC
  Administered 2021-11-09: 15 mL via OROMUCOSAL

## 2021-11-09 MED ORDER — INSULIN ASPART 100 UNIT/ML IJ SOLN: 5.0000 [IU] | Freq: Once | INTRAMUSCULAR | Status: DC

## 2021-11-09 MED ORDER — BUPIVACAINE HCL (PF) 0.5 % IJ SOLN
INTRAMUSCULAR | Status: AC
  Filled 2021-11-09: qty 30

## 2021-11-09 MED ORDER — DIPHENHYDRAMINE HCL 50 MG/ML IJ SOLN: 12.5000 mg | Freq: Once | INTRAMUSCULAR | Status: AC

## 2021-11-09 MED ORDER — DIPHENHYDRAMINE HCL 50 MG/ML IJ SOLN
INTRAMUSCULAR | Status: AC
  Filled 2021-11-09: qty 1

## 2021-11-09 MED ORDER — FENTANYL CITRATE PF 50 MCG/ML IJ SOSY: 50.0000 ug | PREFILLED_SYRINGE | Freq: Once | INTRAMUSCULAR | Status: AC

## 2021-11-09 MED ORDER — PHENYLEPHRINE HCL-NACL 20-0.9 MG/250ML-% IV SOLN
INTRAVENOUS | Status: AC
  Filled 2021-11-09: qty 250

## 2021-11-09 MED ORDER — BUPIVACAINE HCL (PF) 0.5 % IJ SOLN
INTRAMUSCULAR | Status: DC | PRN
  Administered 2021-11-09: 30 mL

## 2021-11-09 MED ORDER — ONDANSETRON HCL 4 MG/2ML IJ SOLN: 4.0000 mg | Freq: Once | INTRAMUSCULAR | Status: DC | PRN

## 2021-11-09 MED ORDER — TRAMADOL HCL 50 MG PO TABS: 50.0000 mg | ORAL_TABLET | Freq: Four times a day (QID) | ORAL | 0 refills | Status: DC | PRN

## 2021-11-09 MED ORDER — FENTANYL CITRATE PF 50 MCG/ML IJ SOSY: 12.5000 ug | PREFILLED_SYRINGE | Freq: Once | INTRAMUSCULAR | Status: DC | PRN

## 2021-11-09 MED ORDER — VANCOMYCIN HCL 2000 MG/400ML IV SOLN
2000.0000 mg | INTRAVENOUS | Status: AC
  Administered 2021-11-09: 2000 mg via INTRAVENOUS
  Filled 2021-11-09 (×2): qty 400

## 2021-11-09 MED ORDER — SUCCINYLCHOLINE CHLORIDE 200 MG/10ML IV SOSY
PREFILLED_SYRINGE | INTRAVENOUS | Status: DC | PRN
Start: 2021-11-09 — End: 2021-11-09
  Administered 2021-11-09: 120 mg via INTRAVENOUS

## 2021-11-09 MED ORDER — APREPITANT 40 MG PO CAPS
ORAL_CAPSULE | ORAL | Status: AC
  Filled 2021-11-09: qty 1

## 2021-11-09 MED ORDER — FENTANYL CITRATE (PF) 100 MCG/2ML IJ SOLN
INTRAMUSCULAR | Status: AC
  Filled 2021-11-09: qty 2

## 2021-11-09 MED ORDER — INSULIN ASPART 100 UNIT/ML IJ SOLN
INTRAMUSCULAR | Status: AC
  Administered 2021-11-09: 5 [IU] via SUBCUTANEOUS
  Filled 2021-11-09: qty 1

## 2021-11-09 MED ORDER — DEXMEDETOMIDINE (PRECEDEX) IN NS 20 MCG/5ML (4 MCG/ML) IV SYRINGE
PREFILLED_SYRINGE | INTRAVENOUS | Status: DC | PRN
  Administered 2021-11-09 (×2): 4 ug via INTRAVENOUS

## 2021-11-09 MED ORDER — INSULIN ASPART 100 UNIT/ML IJ SOLN
INTRAMUSCULAR | Status: AC
  Administered 2021-11-09: 5 [IU]
  Filled 2021-11-09: qty 1

## 2021-11-09 MED ORDER — ONDANSETRON HCL 4 MG/2ML IJ SOLN: 4.0000 mg | Freq: Four times a day (QID) | INTRAMUSCULAR | Status: DC | PRN

## 2021-11-09 MED ORDER — FENTANYL CITRATE PF 50 MCG/ML IJ SOSY
PREFILLED_SYRINGE | INTRAMUSCULAR | Status: AC
  Administered 2021-11-09: 50 ug via INTRAVENOUS
  Filled 2021-11-09: qty 1

## 2021-11-09 MED ORDER — FENTANYL CITRATE (PF) 100 MCG/2ML IJ SOLN: 25.0000 ug | INTRAMUSCULAR | Status: DC | PRN

## 2021-11-09 MED ORDER — APREPITANT 40 MG PO CAPS
40.0000 mg | ORAL_CAPSULE | Freq: Once | ORAL | Status: AC
  Administered 2021-11-09: 40 mg via ORAL

## 2021-11-09 MED ORDER — FENTANYL CITRATE (PF) 100 MCG/2ML IJ SOLN
INTRAMUSCULAR | Status: DC | PRN
  Administered 2021-11-09: 25 ug via INTRAVENOUS

## 2021-11-09 MED ORDER — BUPIVACAINE HCL (PF) 0.5 % IJ SOLN
INTRAMUSCULAR | Status: AC
  Filled 2021-11-09: qty 10

## 2021-11-09 MED ORDER — PROPOFOL 500 MG/50ML IV EMUL
INTRAVENOUS | Status: AC
  Filled 2021-11-09: qty 50

## 2021-11-09 MED ORDER — DIPHENHYDRAMINE HCL 50 MG/ML IJ SOLN
INTRAMUSCULAR | Status: AC
  Administered 2021-11-09: 12.5 mg via INTRAVENOUS
  Filled 2021-11-09: qty 1

## 2021-11-09 MED ORDER — CHLORHEXIDINE GLUCONATE CLOTH 2 % EX PADS: 6.0000 | MEDICATED_PAD | Freq: Once | CUTANEOUS | Status: DC

## 2021-11-09 MED ORDER — ORAL CARE MOUTH RINSE: 15.0000 mL | Freq: Once | OROMUCOSAL | Status: AC

## 2021-11-09 MED ORDER — HEMOSTATIC AGENTS (NO CHARGE) OPTIME
TOPICAL | Status: DC | PRN
  Administered 2021-11-09: 1 via TOPICAL

## 2021-11-09 MED ORDER — PROPOFOL 500 MG/50ML IV EMUL
INTRAVENOUS | Status: DC | PRN
  Administered 2021-11-09: 50 ug/kg/min via INTRAVENOUS

## 2021-11-09 MED ORDER — BUPIVACAINE HCL (PF) 0.5 % IJ SOLN
INTRAMUSCULAR | Status: AC
  Filled 2021-11-09: qty 20

## 2021-11-09 MED ORDER — CHLORHEXIDINE GLUCONATE 0.12 % MT SOLN
OROMUCOSAL | Status: AC
  Filled 2021-11-09: qty 15

## 2021-11-09 MED ORDER — LACTATED RINGERS IV SOLN: INTRAVENOUS | Status: DC

## 2021-11-09 MED ORDER — SODIUM CHLORIDE 0.9 % IV SOLN
INTRAVENOUS | Status: DC | PRN
  Administered 2021-11-09: 500 mL

## 2021-11-09 MED ORDER — INSULIN ASPART 100 UNIT/ML IJ SOLN: 5.0000 [IU] | Freq: Once | INTRAMUSCULAR | Status: AC

## 2021-11-09 MED ORDER — SODIUM CHLORIDE 0.9 % IV SOLN: INTRAVENOUS | Status: DC

## 2021-11-09 MED ORDER — ACETAMINOPHEN 10 MG/ML IV SOLN: 1000.0000 mg | Freq: Once | INTRAVENOUS | Status: DC | PRN

## 2021-11-09 MED ORDER — PHENYLEPHRINE HCL (PRESSORS) 10 MG/ML IV SOLN
INTRAVENOUS | Status: DC | PRN
  Administered 2021-11-09 (×2): 80 ug via INTRAVENOUS

## 2021-11-09 MED ORDER — SEVOFLURANE IN SOLN
RESPIRATORY_TRACT | Status: AC
  Filled 2021-11-09: qty 250

## 2021-11-09 MED ORDER — DIPHENHYDRAMINE HCL 50 MG/ML IJ SOLN
12.5000 mg | Freq: Once | INTRAMUSCULAR | Status: AC
  Administered 2021-11-09: 12.5 mg via INTRAVENOUS

## 2021-11-09 MED ORDER — BUPIVACAINE LIPOSOME 1.3 % IJ SUSP
INTRAMUSCULAR | Status: AC
  Filled 2021-11-09: qty 20

## 2021-11-09 MED ORDER — DEXMEDETOMIDINE HCL IN NACL 200 MCG/50ML IV SOLN
INTRAVENOUS | Status: AC
  Filled 2021-11-09: qty 50

## 2021-11-09 MED ORDER — PROPOFOL 10 MG/ML IV BOLUS
INTRAVENOUS | Status: AC
  Filled 2021-11-09: qty 20

## 2021-11-09 MED ORDER — LABETALOL HCL 5 MG/ML IV SOLN
INTRAVENOUS | Status: DC | PRN
  Administered 2021-11-09 (×2): 5 mg via INTRAVENOUS

## 2021-11-09 MED ORDER — HEPARIN SODIUM (PORCINE) 5000 UNIT/ML IJ SOLN
INTRAMUSCULAR | Status: AC
  Filled 2021-11-09: qty 1

## 2021-11-09 SURGICAL SUPPLY — 57 items
ADH SKN CLS APL DERMABOND .7 (GAUZE/BANDAGES/DRESSINGS) ×1
APL PRP STRL LF DISP 70% ISPRP (MISCELLANEOUS) ×1
BAG DECANTER FOR FLEXI CONT (MISCELLANEOUS) ×2 IMPLANT
BLADE SURG SZ11 CARB STEEL (BLADE) ×2 IMPLANT
BOOT SUTURE AID YELLOW STND (SUTURE) ×2 IMPLANT
BRUSH SCRUB EZ  4% CHG (MISCELLANEOUS) ×2
BRUSH SCRUB EZ 4% CHG (MISCELLANEOUS) ×1 IMPLANT
CHLORAPREP W/TINT 26 (MISCELLANEOUS) ×2 IMPLANT
DERMABOND ADVANCED (GAUZE/BANDAGES/DRESSINGS) ×1
DERMABOND ADVANCED .7 DNX12 (GAUZE/BANDAGES/DRESSINGS) ×1 IMPLANT
DRESSING SURGICEL FIBRLLR 1X2 (HEMOSTASIS) ×1 IMPLANT
DRSG OPSITE POSTOP 4X6 (GAUZE/BANDAGES/DRESSINGS) ×1 IMPLANT
DRSG SURGICEL FIBRILLAR 1X2 (HEMOSTASIS) ×2
DRSG TEGADERM 6X8 (GAUZE/BANDAGES/DRESSINGS) ×1 IMPLANT
ELECT CAUTERY BLADE 6.4 (BLADE) ×2 IMPLANT
ELECT REM PT RETURN 9FT ADLT (ELECTROSURGICAL) ×2
ELECTRODE REM PT RTRN 9FT ADLT (ELECTROSURGICAL) ×1 IMPLANT
GAUZE 4X4 16PLY ~~LOC~~+RFID DBL (SPONGE) ×2 IMPLANT
GLOVE SURG SYN 8.0 (GLOVE) ×2 IMPLANT
GLOVE SURG SYN 8.0 PF PI (GLOVE) ×1 IMPLANT
GOWN STRL REUS W/ TWL LRG LVL3 (GOWN DISPOSABLE) ×1 IMPLANT
GOWN STRL REUS W/ TWL XL LVL3 (GOWN DISPOSABLE) ×1 IMPLANT
GOWN STRL REUS W/TWL LRG LVL3 (GOWN DISPOSABLE) ×2
GOWN STRL REUS W/TWL XL LVL3 (GOWN DISPOSABLE) ×2
IV NS 500ML (IV SOLUTION) ×2
IV NS 500ML BAXH (IV SOLUTION) ×1 IMPLANT
KIT TURNOVER KIT A (KITS) ×2 IMPLANT
LABEL OR SOLS (LABEL) ×2 IMPLANT
LOOP RED MAXI  1X406MM (MISCELLANEOUS) ×1
LOOP VESSEL MAXI  1X406 RED (MISCELLANEOUS) ×1
LOOP VESSEL MAXI 1X406 RED (MISCELLANEOUS) ×1 IMPLANT
LOOP VESSEL MINI 0.8X406 BLUE (MISCELLANEOUS) ×2 IMPLANT
LOOPS BLUE MINI 0.8X406MM (MISCELLANEOUS) ×2
MANIFOLD NEPTUNE II (INSTRUMENTS) ×2 IMPLANT
NDL FILTER BLUNT 18X1 1/2 (NEEDLE) ×1 IMPLANT
NEEDLE FILTER BLUNT 18X 1/2SAF (NEEDLE) ×1
NEEDLE FILTER BLUNT 18X1 1/2 (NEEDLE) ×1 IMPLANT
PACK EXTREMITY ARMC (MISCELLANEOUS) ×2 IMPLANT
PAD PREP 24X41 OB/GYN DISP (PERSONAL CARE ITEMS) ×2 IMPLANT
SLING ARM LRG DEEP (SOFTGOODS) ×1 IMPLANT
STAPLER SKIN PROX 35W (STAPLE) ×1 IMPLANT
STOCKINETTE 48X4 2 PLY STRL (GAUZE/BANDAGES/DRESSINGS) ×1 IMPLANT
STOCKINETTE STRL 4IN 9604848 (GAUZE/BANDAGES/DRESSINGS) ×2 IMPLANT
SUT MNCRL+ 5-0 UNDYED PC-3 (SUTURE) ×1 IMPLANT
SUT MONOCRYL 5-0 (SUTURE) ×2
SUT PROLENE 6 0 BV (SUTURE) ×14 IMPLANT
SUT SILK 2 0 (SUTURE) ×2
SUT SILK 2-0 18XBRD TIE 12 (SUTURE) ×1 IMPLANT
SUT SILK 3 0 (SUTURE) ×2
SUT SILK 3-0 18XBRD TIE 12 (SUTURE) ×1 IMPLANT
SUT SILK 4 0 (SUTURE) ×2
SUT SILK 4-0 18XBRD TIE 12 (SUTURE) ×1 IMPLANT
SUT VIC AB 3-0 SH 27 (SUTURE) ×2
SUT VIC AB 3-0 SH 27X BRD (SUTURE) ×1 IMPLANT
SYR 20ML LL LF (SYRINGE) ×2 IMPLANT
SYR 3ML LL SCALE MARK (SYRINGE) ×2 IMPLANT
WATER STERILE IRR 500ML POUR (IV SOLUTION) ×2 IMPLANT

## 2021-11-09 NOTE — Anesthesia Preprocedure Evaluation (Addendum)
Anesthesia Evaluation  Patient identified by MRN, date of birth, ID band Patient awake    Reviewed: Allergy & Precautions, NPO status , Patient's Chart, lab work & pertinent test results  History of Anesthesia Complications (+) PONV, PROLONGED EMERGENCE and history of anesthetic complications  Airway Mallampati: III   Neck ROM: Full    Dental  (+) Upper Dentures, Lower Dentures   Pulmonary neg pulmonary ROS,    Pulmonary exam normal breath sounds clear to auscultation       Cardiovascular hypertension, + Peripheral Vascular Disease (s/p left BKA) and +CHF  Normal cardiovascular exam+ Valvular Problems/Murmurs (mild AS, mild pulm HTN)  Rhythm:Regular Rate:Normal  Bradycardia; LE lymphedema  ECG 10/31/21: Normal sinus rhythm with sinus arrhythmia Minimal voltage criteria for LVH, may be normal variant ( Sokolow-Lyon ) Borderline ECG When compared with ECG of 25-Apr-2021 15:13, No significant change was found  TTE 01/24/21:  1. Left ventricular ejection fraction, by estimation, is 55 to 60%. The left ventricle has normal function. The left ventricle has no regional wall motion abnormalities. Left ventricular diastolic parameters are consistent with Grade II diastolic dysfunction (pseudonormalization). The average left ventricular global longitudinal strain is -15.1 %. The global longitudinal strain is abnormal.  2. Right ventricular systolic function is normal. The right ventricular size is normal.  3. Left atrial size was mildly dilated.  4. The mitral valve is normal in structure. Mild mitral valve regurgitation.  5. There is a 7 x 5 mm calcification noted on the posterior aspect (non coronary cusp) of the aortic valve. This is more consistent with a calcific mass. The aortic valve is calcified. Aortic valve regurgitation is not visualized. Mild to moderate aortic valve sclerosis/calcification is present, without any evidence of  aortic stenosis.  6. The inferior vena cava is dilated in size with <50% respiratory variability, suggesting right atrial pressure of 15 mmHg.    Neuro/Psych PSYCHIATRIC DISORDERS Anxiety Left eye blindness    GI/Hepatic GERD  ,  Endo/Other  diabetes, Type 2Hyperparathyroidism; class 3 obesity  Renal/GU Renal disease (stage V CKD)     Musculoskeletal   Abdominal   Peds  Hematology  (+) Blood dyscrasia, anemia ,   Anesthesia Other Findings Reviewed 06/18/21 cardiology note.  Reviewed and agree with Bayard Males pre-anesthesia clinical review note.  Reproductive/Obstetrics                             Anesthesia Physical  Anesthesia Plan  ASA: 4  Anesthesia Plan: General and Regional   Post-op Pain Management: Regional block   Induction: Intravenous  PONV Risk Score and Plan: 4 or greater and Ondansetron, Propofol infusion, TIVA and Treatment may vary due to age or medical condition  Airway Management Planned: Natural Airway  Additional Equipment:   Intra-op Plan:   Post-operative Plan:   Informed Consent: I have reviewed the patients History and Physical, chart, labs and discussed the procedure including the risks, benefits and alternatives for the proposed anesthesia with the patient or authorized representative who has indicated his/her understanding and acceptance.       Plan Discussed with: CRNA  Anesthesia Plan Comments: (Plan for preoperative interscalene nerve block and GA with natural airway, LMA/GETA backup.  Patient consented for risks of anesthesia including but not limited to:  - adverse reactions to medications - damage to eyes, teeth, lips or other oral mucosa - nerve damage due to positioning  - sore throat or hoarseness - damage  to heart, brain, nerves, lungs, other parts of body or loss of life  Informed patient about role of CRNA in peri- and intra-operative care.  Patient voiced understanding.)        Anesthesia Quick Evaluation

## 2021-11-09 NOTE — Op Note (Signed)
OPERATIVE NOTE   PROCEDURE: left brachial cephalic arteriovenous fistula placement  PRE-OPERATIVE DIAGNOSIS: End Stage Renal Disease  POST-OPERATIVE DIAGNOSIS: End Stage Renal Disease  SURGEON: [name]  ASSISTANT(S): None  ANESTHESIA: regional; intubated at the end of the case secondary to emesis  ESTIMATED BLOOD LOSS: <50 cc  FINDING(S): Cephalic vein measured 4 mm in diameter  SPECIMEN(S):  none  INDICATIONS:   Rachel Holmes is a 61 y.o. female who presents with end stage renal disease.  The patient is scheduled for left brachiocephalic arteriovenous fistula placement.  The patient is aware the risks include but are not limited to: bleeding, infection, steal syndrome, nerve damage, ischemic monomelic neuropathy, failure to mature, and need for additional procedures.  The patient is aware of the risks of the procedure and elects to proceed forward.  DESCRIPTION: After full informed written consent was obtained from the patient, the patient was brought back to the operating room and placed supine upon the operating table.  Prior to induction, the patient received IV antibiotics.   After obtaining adequate anesthesia, the patient was then prepped and draped in the standard fashion for a left arm access procedure.   A first assistant was required to provide a safe and appropriate environment for executing the surgery.  The assistant was integral in providing retraction, exposure, running suture providing suction and in the closing process.   A curvilinear incision was then created midway between the radial impulse and the cephalic vein. The cephalic vein was then identified and dissected circumferentially. It was marked with a surgical marker.    Attention was then turned to the brachial artery which was exposed through the same incision and looped proximally and distally. Side branches were controlled with 4-0 silk ties.  The distal segment of the vein was ligated  with a  2-0 silk, and the vein was transected.  The proximal segment was interrogated with serial dilators.  The vein accepted up to a 4 mm dilator without any difficulty. Heparinized saline was infused into the vein and clamped it with a small bulldog.  At this point, I reset my exposure of the brachial artery and controlled the artery with vessel loops proximally and distally.  An arteriotomy was then made with a #11 blade, and extended with a Potts scissor.  Heparinized saline was injected proximal and distal into the radial artery.  The vein was then approximated to the artery while the artery was in its native bed and subsequently the vein was beveled using Potts scissors. The vein was then sewn to the artery in an end-to-side configuration with interrupted 6-0 Prolene.  Prior to completing this anastomosis Flushing maneuvers were performed and the artery was allowed to forward and back bleed.  There was no evidence of clot from any vessels.  I completed the anastomosis in the usual fashion and then released all vessel loops and clamps.    There was good  thrill in the venous outflow, and there was 1+ palpable radial pulse.  At this point, I irrigated out the surgical wound.  There was no further active bleeding.  The subcutaneous tissue was reapproximated with a running stitch of 3-0 Vicryl.  The skin was then reapproximated with staples.  The skin was then cleaned, dried, and honeycomb dressing was applied.    The patient tolerated this procedure well.   COMPLICATIONS: None  CONDITION: [facility]  Office: [phone]   11/09/2021, 9:35 AM

## 2021-11-09 NOTE — Anesthesia Procedure Notes (Addendum)
Procedure Name: MAC Date/Time: 11/09/2021 7:35 AM Performed by: Jerrye Noble, CRNA Pre-anesthesia Checklist: Patient identified, Emergency Drugs available, Suction available and Patient being monitored Patient Re-evaluated:Patient Re-evaluated prior to induction Oxygen Delivery Method: Simple face mask

## 2021-11-09 NOTE — Transfer of Care (Signed)
Immediate Anesthesia Transfer of Care Note  Patient: Rachel Holmes  Procedure(s) Performed: ARTERIOVENOUS (AV) FISTULA CREATION ( BRACHIAL CEPHALIC ) (Left)  Patient Location: PACU  Anesthesia Type:General  Level of Consciousness: awake, drowsy and patient cooperative  Airway & Oxygen Therapy: Patient Spontanous Breathing and Patient connected to face mask oxygen  Post-op Assessment: Report given to RN and Post -op Vital signs reviewed and stable  Post vital signs: Reviewed and stable  Last Vitals:  Vitals Value Taken Time  BP 166/72 (97)   Temp    Pulse 96 11/09/21 0929  Resp 23 11/09/21 0929  SpO2 97 % 11/09/21 0929  Vitals shown include unvalidated device data.  Last Pain:  Vitals:   11/09/21 0623  TempSrc: Temporal  PainSc: 0-No pain         Complications: No notable events documented.

## 2021-11-09 NOTE — Anesthesia Procedure Notes (Signed)
Procedure Name: Intubation Date/Time: 11/09/2021 9:07 AM Performed by: Jerrye Noble, CRNA Pre-anesthesia Checklist: Patient identified, Emergency Drugs available, Suction available and Patient being monitored Patient Re-evaluated:Patient Re-evaluated prior to induction Oxygen Delivery Method: Circle system utilized Preoxygenation: Pre-oxygenation with 100% oxygen Induction Type: IV induction and Rapid sequence Laryngoscope Size: McGraph and 3 Grade View: Grade I Tube type: Oral Tube size: 7.0 mm Number of attempts: 1 Airway Equipment and Method: Stylet and Video-laryngoscopy Placement Confirmation: ETT inserted through vocal cords under direct vision, positive ETCO2 and breath sounds checked- equal and bilateral Tube secured with: Tape Dental Injury: Teeth and Oropharynx as per pre-operative assessment

## 2021-11-09 NOTE — Interval H&P Note (Signed)
History and Physical Interval Note:  11/09/2021 7:18 AM  Rachel Holmes  has presented today for surgery, with the diagnosis of ESRD.  The various methods of treatment have been discussed with the patient and family. After consideration of risks, benefits and other options for treatment, the patient has consented to  Procedure(s): ARTERIOVENOUS (AV) FISTULA CREATION ( BRACHIAL CEPHALIC ) POSS. BRACHIAL AXILLARY GRAFT (Left) as a surgical intervention.  The patient's history has been reviewed, patient examined, no change in status, stable for surgery.  I have reviewed the patient's chart and labs.  Questions were answered to the patient's satisfaction.     [name]

## 2021-11-09 NOTE — Anesthesia Postprocedure Evaluation (Signed)
Anesthesia Post Note  Patient: RAYCHEL DOWLER  Procedure(s) Performed: ARTERIOVENOUS (AV) FISTULA CREATION ( BRACHIAL CEPHALIC ) (Left)  Patient location during evaluation: PACU Anesthesia Type: Regional Level of consciousness: awake and alert Pain management: pain level controlled Vital Signs Assessment: post-procedure vital signs reviewed and stable Respiratory status: spontaneous breathing, nonlabored ventilation, respiratory function stable and patient connected to nasal cannula oxygen (Lungs Clear to auscultation) Cardiovascular status: blood pressure returned to baseline and stable Postop Assessment: no apparent nausea or vomiting Anesthetic complications: no   No notable events documented.   Last Vitals:  Vitals:   11/09/21 1015 11/09/21 1032  BP: (!) 142/67 (!) 129/56  Pulse: 77 77  Resp: 19 16  Temp: (!) 36.4 C 36.4 C  SpO2: 97% 94%    Last Pain:  Vitals:   11/09/21 1032  TempSrc: Oral  PainSc: 0-No pain                 Precious Haws Primrose Oler

## 2021-11-09 NOTE — Anesthesia Procedure Notes (Signed)
Anesthesia Regional Block: Supraclavicular block   Pre-Anesthetic Checklist: , timeout performed,  Correct Patient, Correct Site, Correct Laterality,  Correct Procedure,, site marked,  Risks and benefits discussed,  Surgical consent,  Pre-op evaluation,  At surgeon's request and post-op pain management  Laterality: Left  Prep: chloraprep       Needles:  Injection technique: Single-shot  Needle Type: Echogenic Needle          Additional Needles:   Procedures:,,,, ultrasound used (permanent image in chart),,   Motor weakness within 20 minutes.  Narrative:  Start time: 11/09/2021 7:23 AM End time: 11/09/2021 7:25 AM Injection made incrementally with aspirations every 5 mL.  Performed by: Personally  Anesthesiologist: Darrin Nipper, MD  Additional Notes: Functioning IV was confirmed and monitors applied.  Sterile prep and drape, hand hygiene and sterile gloves were used. Ultrasound guidance: relevant anatomy identified, needle position confirmed, local anesthetic spread visualized around nerve(s), vascular puncture avoided.  Image saved to electronic medical record.  Negative aspiration prior to incremental administration of local anesthetic for total 30 ml bupivacaine 0.5% given in supraclavicular distribution. The patient tolerated the procedure well. Vital signs and moderate sedation medications recorded in RN notes.

## 2021-11-09 NOTE — Discharge Instructions (Signed)
AMBULATORY SURGERY  DISCHARGE INSTRUCTIONS   The drugs that you were given will stay in your system until tomorrow so for the next 24 hours you should not:  Drive an automobile Make any legal decisions Drink any alcoholic beverage   You may resume regular meals tomorrow.  Today it is better to start with liquids and gradually work up to solid foods.  You may eat anything you prefer, but it is better to start with liquids, then soup and crackers, and gradually work up to solid foods.   Please notify your doctor immediately if you have any unusual bleeding, trouble breathing, redness and pain at the surgery site, drainage, fever, or pain not relieved by medication.    Additional Instructions:        Please contact your physician with any problems or Same Day Surgery at [phone], Monday through Friday 6 am to 4 pm, or [place] at [facility] number at [phone].     Peripheral Nerve Block, Upper Extremity (PNBUE) Discharge Instructions    For your surgery you have received a Peripheral Nerve Block. Nerve Blocks affect many types of nerves, including nerves that control movement, pain and normal sensation.  You may experience feelings such as numbness, tingling, heaviness, weakness or the inability to move your arm or the feeling or sensation that your arm has "fallen asleep". A nerve block can last for 2 - 36 hours or more depending on the medication used.  Usually the weakness wears off first.  The tingling and heaviness usually wear off next.  Finally you may start to notice pain.  Keep in mind that this may occur in any order.  once a nerve block starts to wear off it is usually completely gone within 60 minutes. If needed, your surgeon will give you a prescription for pain medication.  It will take about 60 minutes for the oral pain medication to become fully effective.  So, it is recommended that you start taking this medication before the nerve block first begins  to wear off, or when you first begin to feel discomfort. Keep in mind that nerve blocks often wear off in the middle of the night.  If you are going to bed and the block has not started to wear off or you have not started to have any discomfort, consider setting an alarm for 2 to 3 hours, so you can assess your block.  If you notice the block is wearing off or you are starting to have discomfort, you can take your pain medication. Take your pain medication only as prescribed.  Pain medication can cause sedation and decrease your breathing if you take more than you need for the level of pain that you have. Nausea is a common side effect of many pain medications.  You may want to eat something before taking your pain medicine to prevent nausea. After a peripheral nerve block, you cannot feel pain, pressure or extremes in temperature in the effected arm.  Because your arm is numb it is at an increased risk for injury.  To decrease the possibility of injury, please practice the following:  While you are awake change the position of your arm frequently to prevent too much pressure on any one area for prolonged periods of time.  If you have a cast or tight dressing, check the color or your fingers every couple of hours.  Call your surgeon with the appearance of any discoloration (white or blue). If you are given a sling  to wear before you go home, please wear it  at all times until the block has completely worn off.  Do not get up at night without your sling. If you experience any problems or concerns, please contact your surgeon's office. If you experience severe or prolonged shortness of breath go to the nearest emergency department.

## 2021-11-25 NOTE — Progress Notes (Signed)
Patient ID: Rachel Holmes, female   DOB: 1961-09-24, 61 y.o.   MRN: [id number]  No chief complaint on file.   HPI Rachel Holmes is a 61 y.o. female.    Procedure on 11/09/2021: 1. left brachial cephalic arteriovenous fistula placement  She denies pain at the site.  No drainage no fever chills   Past Medical History:  Diagnosis Date   Allergic rhinitis    Allergy    Anemia    Anxiety    Aortic stenosis 09/13/2016   a.) TTE 09/13/2016: mild AS --> MPG 11.2 mmHg. b.) TTE 12/27/2020: EF 55-60%; no AS --> MPG 8 mmHg.   Aortic valve endocarditis 12/25/2018   a.) TTE 12/25/2018: 0.75 x 1 cm AV mass. b.) TEE 12/28/2018: small shaggy mobile density on aortic side of RIGHT coronary cusp consistent with vegetation.   Arthritis    Back pain    Bradycardia    Breast mass    Patient can no longer palpate specific masses but showed tech general area of concern   Charcot's joint of ankle, left    CHF (congestive heart failure) ([place]) 07/09/2007   a.) TTE 07/09/2007: EF 50%; G1DD. b.) TTE 09/13/2016: EF 55%; G2DD. c.) TTE 12/27/2020: EF 55-60%; G2DD; GLS -15.1%.   CKD (chronic kidney disease), stage III (HCC)    Complication of anesthesia    a.) PONV. b.) Delayed emergence   Constipation    Diabetic nephropathy ([place])    Dyspnea    Family history of adverse reaction to anesthesia    a.) Sisters x 2 with (+) delayed emergence.   GERD (gastroesophageal reflux disease)    Heart murmur    HLD (hyperlipidemia)    Hyperparathyroidism ([place])    Hypertension    Joint pain    Leg edema    Legally blind in left eye, as defined in [place]    Lymphedema    Mild pulmonary hypertension ([place]) 09/13/2016   a.) TTE 09/13/2016: EF 55%; RVSP 45 mmHg. b.) TTE 12/25/2018: EF 55-60%; RVSP 35.5 mmHg.   Obesity    Onychomycosis    PONV (postoperative nausea and vomiting)    S/P BKA (below knee amputation) unilateral, left ([place])    Sepsis ([place]) 12/2018   a.) group G streptococcal bacteremia with AV  endocarditis and LLE soft tissue infection.   T2DM (type 2 diabetes mellitus) ([place])     Past Surgical History:  Procedure Laterality Date   ABDOMINAL HYSTERECTOMY     AMPUTATION Left 05/05/2019   Procedure: AMPUTATION BELOW KNEE;  Surgeon: Katha Cabal, MD;  Location: [facility] ORS;  Service: Vascular;  Laterality: Left;   AV FISTULA PLACEMENT Right 09/19/2021   Procedure: ARTERIOVENOUS (AV) FISTULA CREATION (BRACHIAL CEPHALIC);  Surgeon: Katha Cabal, MD;  Location: [facility] ORS;  Service: Vascular;  Laterality: Right;   AV FISTULA PLACEMENT Left 11/09/2021   Procedure: ARTERIOVENOUS (AV) FISTULA CREATION ( BRACHIAL CEPHALIC );  Surgeon: Katha Cabal, MD;  Location: [facility] ORS;  Service: Vascular;  Laterality: Left;   BREAST BIOPSY Left 2014   FNA 12:00 position - Negative   EYE SURGERY Left 2007   removed a lens, no lens implanted   IR FLUORO GUIDE CV LINE RIGHT  12/29/2018   IR REMOVAL TUN CV CATH W/O FL  02/19/2019   IR US GUIDE VASC ACCESS RIGHT  12/29/2018   TEE WITHOUT CARDIOVERSION N/A 12/28/2018   Procedure: TRANSESOPHAGEAL ECHOCARDIOGRAM (TEE);  Surgeon: Sueanne Margarita, MD;  [facility] END[facility]COPY;  Service: Cardiovascular;  Laterality: N/A;      Allergies  Allergen Reactions   Nifedipine Rash   Penicillins Hives, Shortness Of Breath and Swelling   Statins Shortness Of Breath    Wheezing   Hydralazine Itching   Hydrocodone Itching and Nausea Only   Oxycodone Nausea Only   Codeine Nausea Only   Ibuprofen Other (See Comments)    Raises blood pressure    Current Outpatient Medications  Medication Sig Dispense Refill   acetaminophen (TYLENOL) 500 MG tablet Take 1,000 mg by mouth every 6 (six) hours as needed.     albuterol (PROVENTIL HFA;VENTOLIN HFA) 108 (90 Base) MCG/ACT inhaler Inhale 1-2 puffs into the lungs every 6 (six) hours as needed for wheezing or shortness of breath.     aspirin EC 81 MG EC tablet Take 1 tablet (81 mg total) by mouth daily.      Blood Glucose Monitoring Suppl (ONE TOUCH ULTRA 2) w/Device KIT 1 Device by Does not apply route daily. 1 each 0   calcitRIOL (ROCALTROL) 0.25 MCG capsule Take 0.25 mcg by mouth daily.     cetirizine (ZYRTEC) 10 MG tablet Take 10 mg by mouth at bedtime.     clobetasol cream (TEMOVATE) 5.85 % Apply 1 application topically 2 (two) times daily as needed (rash).     cloNIDine (CATAPRES) 0.3 MG tablet Take 1 tablet (0.3 mg total) by mouth 2 (two) times daily. 180 tablet 3   Continuous Blood Gluc Receiver (FREESTYLE LIBRE 14 DAY READER) DEVI USE AS DIRECTED 1 each 0   Continuous Blood Gluc Sensor (FREESTYLE LIBRE 14 DAY SENSOR) MISC USE DEVICE FOR 14 DAYS AS DIRECTED 6 each 0   docusate sodium (COLACE) 100 MG capsule Take 1 capsule (100 mg total) by mouth 2 (two) times daily. 10 capsule 0   doxazosin (CARDURA) 4 MG tablet Take 1 tablet (4 mg total) by mouth at bedtime. 90 tablet 3   DULoxetine (CYMBALTA) 30 MG capsule Take 30 mg by mouth at bedtime.     ezetimibe (ZETIA) 10 MG tablet Take 1 tablet (10 mg total) by mouth daily. (Patient taking differently: Take 10 mg by mouth at bedtime.) 90 tablet 3   gabapentin (NEURONTIN) 600 MG tablet Take 300 mg by mouth at bedtime.     insulin lispro (HUMALOG KWIKPEN) 100 UNIT/ML KwikPen 3 times a day (just before each meal) 16-18-19 units, and pen needles 3 day. (Patient taking differently: Inject 20 Units into the skin 3 (three) times daily with meals.)     Insulin Pen Needle (BD PEN NEEDLE MICRO U/F) 32G X 6 MM MISC 1 each by Does not apply route 3 (three) times daily. 100 each 3   isosorbide mononitrate (IMDUR) 120 MG 24 hr tablet Take 1 tablet (120 mg total) by mouth daily. (Patient taking differently: Take 120 mg by mouth in the morning and at bedtime.) 180 tablet 3   Lancets (ONETOUCH ULTRASOFT) lancets Used to check blood sugars four times daily. 200 each 12   methocarbamol (ROBAXIN) 750 MG tablet Take 1 tablet (750 mg total) by mouth every 12 (twelve) hours  as needed for muscle spasms. 60 tablet 2   Multiple Vitamin (MULTIVITAMIN WITH MINERALS) TABS tablet Take 1 tablet by mouth daily.     olmesartan (BENICAR) 40 MG tablet Take 40 mg by mouth at bedtime.     omeprazole (PRILOSEC) 40 MG capsule Take 40 mg by mouth 2 (two) times daily with breakfast and lunch.  ondansetron (ZOFRAN ODT) 4 MG disintegrating tablet Take 1 tablet (4 mg total) by mouth every 8 (eight) hours as needed for nausea or vomiting. 20 tablet 0   ONETOUCH VERIO test strip USE  STRIP TO CHECK GLUCOSE TWICE DAILY 100 each 0   torsemide (DEMADEX) 100 MG tablet Take 100 mg by mouth every morning.     torsemide (DEMADEX) 20 MG tablet Take 1 tablet (20 mg total) by mouth 2 (two) times daily. Take an extra 44m tablet daily prn in the morning for swelling or sob. (Patient not taking: Reported on 10/25/2021) 180 tablet 3   traMADol (ULTRAM) 50 MG tablet Take 1 tablet (50 mg total) by mouth 2 (two) times daily. 20 tablet 0   traMADol (ULTRAM) 50 MG tablet Take 1 tablet (50 mg total) by mouth every 6 (six) hours as needed for moderate pain or severe pain. 36 tablet 0   TRULICITY 3 MDH/7.8XBSOPN INJECT 3MG SUBCUTANEOUSLY ONCE A WEEK (Patient not taking: Reported on 184/78/4128 12 mL 2   TRULICITY 4.5 MSK/8.1NGSOPN Inhale 4.5 mg into the lungs every 7 (seven) days.     vitamin B-12 (CYANOCOBALAMIN) 1000 MCG tablet Take 1,000 mcg by mouth daily.     No current facility-administered medications for this visit.        Physical Exam There were no vitals taken for this visit. Gen:  WD/WN, NAD Skin: incision C/D/small superficial gap in the lateral incision no infection no drainage Excellent thrill and bruit     Assessment/Plan: 1. Chronic renal failure, stage 5 (HCC) Recommend:  The patient is doing well and currently has adequate dialysis access.  Small superficial gap in the incision which I will dress with silvadene daily  The patient's dialysis center is not reporting any  access issues. Flow pattern is stable when compared to the prior ultrasound.  The patient should have a duplex ultrasound of the dialysis access in 1 month. The patient will follow-up with me in the office after each ultrasound.    - [place]AS [place] DIALYS[place] ACCESS (AVF, AVG); Future    GHortencia Pilar1/22/2023, 2:12 PM   This note was created with Dragon medical transcription system.  Any errors from dictation are unintentional.

## 2021-11-26 ENCOUNTER — Encounter (INDEPENDENT_AMBULATORY_CARE_PROVIDER_SITE_OTHER): Payer: Self-pay | Admitting: Vascular Surgery

## 2021-11-26 ENCOUNTER — Ambulatory Visit (INDEPENDENT_AMBULATORY_CARE_PROVIDER_SITE_OTHER): Payer: Medicare Other | Admitting: Vascular Surgery

## 2021-11-26 ENCOUNTER — Other Ambulatory Visit: Payer: Self-pay

## 2021-11-26 VITALS — BP 130/73 | HR 80 | Resp 16 | Ht 68.0 in | Wt 300.0 lb

## 2021-11-26 DIAGNOSIS — N185 Chronic kidney disease, stage 5: Secondary | ICD-10-CM

## 2021-11-26 MED ORDER — SILVER SULFADIAZINE 1 % EX CREA: 1.0000 "application " | TOPICAL_CREAM | Freq: Every day | CUTANEOUS | 0 refills | Status: DC

## 2021-12-12 NOTE — Telephone Encounter (Signed)
LMTCB to schedule appointment °

## 2021-12-18 NOTE — Telephone Encounter (Signed)
Called Patient to schedule missed appointments. Patient stated that she is receiving treatment from a different Doctor and does not want to reschedule appointment.

## 2021-12-31 ENCOUNTER — Encounter (INDEPENDENT_AMBULATORY_CARE_PROVIDER_SITE_OTHER): Payer: Self-pay | Admitting: Vascular Surgery

## 2021-12-31 ENCOUNTER — Ambulatory Visit (INDEPENDENT_AMBULATORY_CARE_PROVIDER_SITE_OTHER): Payer: Medicare Other

## 2021-12-31 ENCOUNTER — Other Ambulatory Visit: Payer: Self-pay

## 2021-12-31 ENCOUNTER — Ambulatory Visit (INDEPENDENT_AMBULATORY_CARE_PROVIDER_SITE_OTHER): Payer: Medicare Other | Admitting: Vascular Surgery

## 2021-12-31 DIAGNOSIS — N185 Chronic kidney disease, stage 5: Secondary | ICD-10-CM

## 2021-12-31 NOTE — Progress Notes (Signed)
MRN : [id number]  Rachel Holmes is a 61 y.o. (02/10/1961) female who presents with chief complaint of post op check.  History of Present Illness:  Patient is not yet on dialysis  Patient returns to the office status post creation of fistula.  She denies pain at the incision.  She denies hand pain.  The incision is now completely healed.  Current Meds  Medication Sig   acetaminophen (TYLENOL) 500 MG tablet Take 1,000 mg by mouth every 6 (six) hours as needed.   albuterol (PROVENTIL HFA;VENTOLIN HFA) 108 (90 Base) MCG/ACT inhaler Inhale 1-2 puffs into the lungs every 6 (six) hours as needed for wheezing or shortness of breath.   aspirin EC 81 MG EC tablet Take 1 tablet (81 mg total) by mouth daily.   Blood Glucose Monitoring Suppl (ONE TOUCH ULTRA 2) w/Device KIT 1 Device by Does not apply route daily.   calcitRIOL (ROCALTROL) 0.25 MCG capsule Take 0.25 mcg by mouth daily.   cetirizine (ZYRTEC) 10 MG tablet Take 10 mg by mouth at bedtime.   clobetasol cream (TEMOVATE) 9.67 % Apply 1 application topically 2 (two) times daily as needed (rash).   cloNIDine (CATAPRES) 0.3 MG tablet Take 1 tablet (0.3 mg total) by mouth 2 (two) times daily.   Continuous Blood Gluc Receiver (FREESTYLE LIBRE 14 DAY READER) DEVI USE AS DIRECTED   Continuous Blood Gluc Sensor (FREESTYLE LIBRE 14 DAY SENSOR) MISC USE DEVICE FOR 14 DAYS AS DIRECTED   docusate sodium (COLACE) 100 MG capsule Take 1 capsule (100 mg total) by mouth 2 (two) times daily.   doxazosin (CARDURA) 4 MG tablet Take 1 tablet (4 mg total) by mouth at bedtime.   DULoxetine (CYMBALTA) 30 MG capsule Take 30 mg by mouth at bedtime.   ezetimibe (ZETIA) 10 MG tablet Take 1 tablet (10 mg total) by mouth daily. (Patient taking differently: Take 10 mg by mouth at bedtime.)   gabapentin (NEURONTIN) 600 MG tablet Take 300 mg by mouth at bedtime.   insulin lispro (HUMALOG KWIKPEN) 100 UNIT/ML KwikPen 3 times a day (just before each meal) 16-18-19 units,  and pen needles 3 day. (Patient taking differently: Inject 20 Units into the skin 3 (three) times daily with meals.)   Insulin Pen Needle (BD PEN NEEDLE MICRO U/F) 32G X 6 MM MISC 1 each by Does not apply route 3 (three) times daily.   isosorbide mononitrate (IMDUR) 120 MG 24 hr tablet Take 1 tablet (120 mg total) by mouth daily. (Patient taking differently: Take 120 mg by mouth in the morning and at bedtime.)   Lancets (ONETOUCH ULTRASOFT) lancets Used to check blood sugars four times daily.   methocarbamol (ROBAXIN) 750 MG tablet Take 1 tablet (750 mg total) by mouth every 12 (twelve) hours as needed for muscle spasms.   Multiple Vitamin (MULTIVITAMIN WITH MINERALS) TABS tablet Take 1 tablet by mouth daily.   olmesartan (BENICAR) 40 MG tablet Take 40 mg by mouth at bedtime.   omeprazole (PRILOSEC) 40 MG capsule Take 40 mg by mouth 2 (two) times daily with breakfast and lunch.   ondansetron (ZOFRAN ODT) 4 MG disintegrating tablet Take 1 tablet (4 mg total) by mouth every 8 (eight) hours as needed for nausea or vomiting.   ONETOUCH VERIO test strip USE  STRIP TO CHECK GLUCOSE TWICE DAILY   silver sulfADIAZINE (SILVADENE) 1 % cream Apply 1 application topically daily.   torsemide (DEMADEX) 100 MG tablet Take 100 mg by mouth every morning.  traMADol (ULTRAM) 50 MG tablet Take 1 tablet (50 mg total) by mouth every 6 (six) hours as needed for moderate pain or severe pain.   TRULICITY 4.5 IW/9.7LG SOPN Inhale 4.5 mg into the lungs every 7 (seven) days.   vitamin B-12 (CYANOCOBALAMIN) 1000 MCG tablet Take 1,000 mcg by mouth daily.    Past Medical History:  Diagnosis Date   Allergic rhinitis    Allergy    Anemia    Anxiety    Aortic stenosis 09/13/2016   a.) TTE 09/13/2016: mild AS --> MPG 11.2 mmHg. b.) TTE 12/27/2020: EF 55-60%; no AS --> MPG 8 mmHg.   Aortic valve endocarditis 12/25/2018   a.) TTE 12/25/2018: 0.75 x 1 cm AV mass. b.) TEE 12/28/2018: small shaggy mobile density on aortic side  of RIGHT coronary cusp consistent with vegetation.   Arthritis    Back pain    Bradycardia    Breast mass    Patient can no longer palpate specific masses but showed tech general area of concern   Charcot's joint of ankle, left    CHF (congestive heart failure) ([place]) 07/09/2007   a.) TTE 07/09/2007: EF 50%; G1DD. b.) TTE 09/13/2016: EF 55%; G2DD. c.) TTE 12/27/2020: EF 55-60%; G2DD; GLS -15.1%.   CKD (chronic kidney disease), stage III (HCC)    Complication of anesthesia    a.) PONV. b.) Delayed emergence   Constipation    Diabetic nephropathy ([place])    Dyspnea    Family history of adverse reaction to anesthesia    a.) Sisters x 2 with (+) delayed emergence.   GERD (gastroesophageal reflux disease)    Heart murmur    HLD (hyperlipidemia)    Hyperparathyroidism ([place])    Hypertension    Joint pain    Leg edema    Legally blind in left eye, as defined in [place]    Lymphedema    Mild pulmonary hypertension ([place]) 09/13/2016   a.) TTE 09/13/2016: EF 55%; RVSP 45 mmHg. b.) TTE 12/25/2018: EF 55-60%; RVSP 35.5 mmHg.   Obesity    Onychomycosis    PONV (postoperative nausea and vomiting)    S/P BKA (below knee amputation) unilateral, left ([place])    Sepsis ([place]) 12/2018   a.) group G streptococcal bacteremia with AV endocarditis and LLE soft tissue infection.   T2DM (type 2 diabetes mellitus) ([place])     Past Surgical History:  Procedure Laterality Date   ABDOMINAL HYSTERECTOMY     AMPUTATION Left 05/05/2019   Procedure: AMPUTATION BELOW KNEE;  Surgeon: Katha Cabal, MD;  Location: [facility];  Service: Vascular;  Laterality: Left;   AV FISTULA PLACEMENT Right 09/19/2021   Procedure: ARTERIOVENOUS (AV) FISTULA CREATION (BRACHIAL CEPHALIC);  Surgeon: Katha Cabal, MD;  Location: [facility];  Service: Vascular;  Laterality: Right;   AV FISTULA PLACEMENT Left 11/09/2021   Procedure: ARTERIOVENOUS (AV) FISTULA CREATION ( BRACHIAL CEPHALIC );  Surgeon: Katha Cabal, MD;  Location:  [facility];  Service: Vascular;  Laterality: Left;   BREAST BIOPSY Left 2014   FNA 12:00 position - Negative   EYE SURGERY Left 2007   removed a lens, no lens implanted   IR FLUORO GUIDE CV LINE RIGHT  12/29/2018   IR REMOVAL TUN CV CATH W/O FL  02/19/2019   IR US GUIDE VASC ACCESS RIGHT  12/29/2018   TEE WITHOUT CARDIOVERSION N/A 12/28/2018   Procedure: TRANSESOPHAGEAL ECHOCARDIOGRAM (TEE);  Surgeon: Sueanne Margarita, MD;  Location: [place];  Service: Cardiovascular;  Laterality: N/A;    Social History Social History   Tobacco Use   Smoking status: Never   Smokeless tobacco: Never  Vaping Use   Vaping Use: Never used  Substance Use Topics   Alcohol use: No   Drug use: Never    Family History Family History  Problem Relation Age of Onset   Breast cancer Sister 40   Diabetes Sister    Diabetes Mother    Hypertension Mother    Hyperlipidemia Mother    Eating disorder Mother    Obesity Mother     Allergies  Allergen Reactions   Nifedipine Rash   Penicillins Hives, Shortness Of Breath and Swelling   Statins Shortness Of Breath    Wheezing   Hydralazine Itching   Hydrocodone Itching and Nausea Only   Oxycodone Nausea Only   Codeine Nausea Only   Ibuprofen Other (See Comments)    Raises blood pressure     REVIEW OF SYSTEMS (Negative unless checked)  Constitutional: [] Weight loss  [] Fever  [] Chills Cardiac: [] Chest pain   [] Chest pressure   [] Palpitations   [] Shortness of breath when laying flat   [] Shortness of breath with exertion. Vascular:  [] Pain in legs with walking   [] Pain in legs at rest  [] History of DVT   [] Phlebitis   [] Swelling in legs   [] Varicose veins   [] Non-healing ulcers Pulmonary:   [] Uses home oxygen   [] Productive cough   [] Hemoptysis   [] Wheeze  [] COPD   [] Asthma Neurologic:  [] Dizziness   [] Seizures   [] History of stroke   [] History of TIA  [] Aphasia   [] Vissual changes   [] Weakness or numbness in arm   [] Weakness or numbness in  leg Musculoskeletal:   [] Joint swelling   [] Joint pain   [] Low back pain Hematologic:  [] Easy bruising  [] Easy bleeding   [] Hypercoagulable state   [] Anemic Gastrointestinal:  [] Diarrhea   [] Vomiting  [] Gastroesophageal reflux/heartburn   [] Difficulty swallowing. Genitourinary:  [x] Chronic kidney disease   [] Difficult urination  [] Frequent urination   [] Blood in urine Skin:  [] Rashes   [] Ulcers  Psychological:  [] History of anxiety   []  History of major depression.  Physical Examination  Vitals:   12/31/21 1411  BP: (!) 180/75  Pulse: 67  Resp: 16  Weight: (!) 315 lb (142.9 kg)   Body mass index is 47.9 kg/m. Gen: WD/WN, NAD Head: [place]/AT, No temporalis wasting.  Ear/Nose/Throat: Hearing grossly intact, nares w/o erythema or drainage Eyes: PER, EOMI, sclera nonicteric.  Neck: Supple, no gross masses or lesions.  No JVD.  Pulmonary:  Good air movement, no audible wheezing, no use of accessory muscles.  Cardiac: RRR, precordium non-hyperdynamic. Vascular:   Left arm AV fistula brachiocephalic good thrill good bruit the incision now completely healed.  She does have a moderate amount of keloid formation of both the right wrist as well as the left antecubital incision Vessel Right Left  Radial Palpable Palpable  Brachial Palpable Palpable  Gastrointestinal: soft, non-distended. No guarding/no peritoneal signs.  Musculoskeletal: M/S 5/5 throughout.  No deformity.  Neurologic: CN 2-12 intact. Pain and light touch intact in extremities.  Symmetrical.  Speech is fluent. Motor exam as listed above. Psychiatric: Judgment intact, Mood & affect appropriate for pt's clinical situation. Dermatologic: No rashes or ulcers noted.  No changes consistent with cellulitis.   CBC Lab Results  Component Value Date   WBC 6.9 10/31/2021   HGB 10.2 (L) 11/09/2021   HCT 30.0 (L) 11/09/2021   MCV 91.1  10/31/2021   PLT 219 10/31/2021    BMET    Component Value Date/Time   NA 134 (L) 11/09/2021  0648   NA 139 06/25/2018 1050   NA 137 11/21/2013 0627   K 4.3 11/09/2021 0648   K 4.6 11/21/2013 0627   CL 98 11/09/2021 0648   CL 107 11/21/2013 0627   CO2 25 10/31/2021 1351   CO2 24 11/21/2013 0627   GLUCOSE 237 (H) 11/09/2021 0648   GLUCOSE 95 11/21/2013 0627   BUN 76 (H) 11/09/2021 0648   BUN 43 (H) 06/25/2018 1050   BUN 34 (H) 11/21/2013 0627   CREATININE 4.60 (H) 11/09/2021 0648   CREATININE 1.80 (H) 09/09/2014 1220   CALCIUM 8.7 (L) 10/31/2021 1351   CALCIUM 8.8 09/09/2014 1220   GFRNONAA 14 (L) 10/31/2021 1351   GFRNONAA 31 (L) 09/09/2014 1220   GFRNONAA 26 (L) 11/21/2013 0627   GFRAA 17 (L) 05/28/2019 2252   GFRAA 38 (L) 09/09/2014 1220   GFRAA 30 (L) 11/21/2013 0627   CrCl cannot be calculated (Patient's most recent lab result is older than the maximum 21 days allowed.).  COAG Lab Results  Component Value Date   INR 1.1 09/17/2021   INR 0.8 05/29/2019   INR 1.1 04/30/2019    Radiology No results found.   Assessment/Plan 1. Chronic renal failure, stage 5 (HCC) Recommend:  The patient has not yet been initiated on grossly closure does have significantly kidney function.  This has been stable and she has not uremic symptoms.  At this point I would expect that her fistula will continue to mature and should be ready to for access when needed  The patient is doing well and currently has adequate dialysis access. The patient's dialysis center is not reporting any access issues.  The patient should have a duplex ultrasound of the dialysis access in 6 months. The patient will follow-up with me in the office after each ultrasound    - VAS [name] (AVF, AVG)    [name], MD  12/31/2021 2:19 PM

## 2022-01-02 ENCOUNTER — Encounter (INDEPENDENT_AMBULATORY_CARE_PROVIDER_SITE_OTHER): Payer: Self-pay | Admitting: Vascular Surgery

## 2022-01-19 ENCOUNTER — Other Ambulatory Visit: Payer: Self-pay | Admitting: Endocrinology

## 2022-02-01 ENCOUNTER — Encounter (HOSPITAL_COMMUNITY): Payer: Self-pay | Admitting: Radiology

## 2022-02-12 ENCOUNTER — Encounter: Payer: Self-pay | Admitting: Student in an Organized Health Care Education/Training Program

## 2022-02-12 ENCOUNTER — Ambulatory Visit
Payer: Medicare Other | Attending: Student in an Organized Health Care Education/Training Program | Admitting: Student in an Organized Health Care Education/Training Program

## 2022-02-12 VITALS — BP 150/74 | HR 66 | Temp 97.6°F | Ht 66.0 in | Wt 315.0 lb

## 2022-02-12 DIAGNOSIS — G8929 Other chronic pain: Secondary | ICD-10-CM | POA: Diagnosis present

## 2022-02-12 DIAGNOSIS — Z89512 Acquired absence of left leg below knee: Secondary | ICD-10-CM | POA: Insufficient documentation

## 2022-02-12 DIAGNOSIS — G894 Chronic pain syndrome: Secondary | ICD-10-CM | POA: Insufficient documentation

## 2022-02-12 DIAGNOSIS — M25561 Pain in right knee: Secondary | ICD-10-CM | POA: Insufficient documentation

## 2022-02-12 DIAGNOSIS — M1711 Unilateral primary osteoarthritis, right knee: Secondary | ICD-10-CM | POA: Insufficient documentation

## 2022-02-12 DIAGNOSIS — E1140 Type 2 diabetes mellitus with diabetic neuropathy, unspecified: Secondary | ICD-10-CM | POA: Insufficient documentation

## 2022-02-12 NOTE — Progress Notes (Signed)
PROVIDER NOTE: Information contained herein reflects review and annotations entered in association with encounter. Interpretation of such information and data should be left to medically-trained personnel. Information provided to patient can be located elsewhere in the medical record under "Patient Instructions". Document created using STT-dictation technology, any transcriptional errors that may result from process are unintentional.  ?  ?Patient: Rachel Holmes  Service Category: E/M  Provider: Gillis Santa, MD  ?DOB: [birth year]  DOS: 02/12/2022  Specialty: Interventional Pain Management  ?MRN: [id number]  Setting: Ambulatory outpatient  PCP: Sharyne Peach, MD  ?Type: Established Patient    Referring Provider: Sharyne Peach, MD  ?Location: Office  Delivery: Face-to-face    ? ?HPI  ?Reason for encounter: Rachel Holmes, a 61 y.o. year old female, is here today for evaluation and management of her Primary osteoarthritis of right knee [M17.11]. Ms. Impson primary complain today is Knee Pain (right) ?Last encounter: Practice (06/22/2020). My last encounter with her was on 08/02/20 ? ?Pertinent problems: Ms. Grzesiak has Charcot's joint of left foot; Chronic pain of right knee; Uncontrolled type 2 diabetes mellitus with polyneuropathy; Primary osteoarthritis of right knee; Morbid obesity with BMI of 45.0-49.9, adult ([place]); Closed nondisplaced fracture of right patella; Hx of BKA, left ([place]); Diabetic polyneuropathy associated with type 2 diabetes mellitus ([place]); Chronic left shoulder pain; and Below-knee amputation of left lower extremity ([place]) on their pertinent problem list. ?Pain Assessment: Severity of Chronic pain is reported as a 2 /10. Location: Knee Left, Right/Denies. Onset: More than a month ago. Quality: Burning, Constant, Aching, Throbbing. Timing: Constant. Modifying factor(s): Meds and rest. ?Vitals:  height is _0  (1.676 m) and weight is 315 lb (142.9 kg) (abnormal). Her temperature is  97.6 ?F (36.4 ?C). Her blood pressure is 150/74 (abnormal) and her pulse is 66. Her oxygen saturation is 96%.  ? ?Patient presents today complaining of swelling and pain of right knee.  She states that this has been going on for the last month.  Patient is status post right genicular nerve radiofrequency ablation on 02/07/2020 that provided her with approximately 80% pain relief for over 2 years.  Given return of her right knee pain, patient would like to repeat right genicular nerve RFA. ? ?She is also having paresthesias of her right foot related to painful diabetic neuropathy.  We discussed [place] treatment for this condition.  Risk and benefits reviewed and patient like to proceed. ? ?ROS  ?Constitutional: Denies any fever or chills ?Gastrointestinal: No reported hemesis, hematochezia, vomiting, or acute GI distress ?Musculoskeletal:  Right knee swelling, right knee pain ?Neurological:  Right foot paresthesias ? ?Medication Review  ?DULoxetine, Dulaglutide, FreeStyle Libre 14 Day Reader, FreeStyle Libre 14 Day Sensor, Insulin Pen Needle, ONE TOUCH ULTRA 2, acetaminophen, albuterol, aspirin, calcitRIOL, cetirizine, cloNIDine, clobetasol cream, docusate sodium, doxazosin, ezetimibe, gabapentin, glucose blood, insulin lispro, isosorbide mononitrate, methocarbamol, multivitamin with minerals, olmesartan, omeprazole, ondansetron, onetouch ultrasoft, silver sulfADIAZINE, torsemide, traMADol, and vitamin B-12 ? ?History Review  ?Allergy: Ms. Grall is allergic to nifedipine, penicillins, statins, hydralazine, hydrocodone, oxycodone, codeine, and ibuprofen. ?Drug: Ms. Rost  reports no history of drug use. ?Alcohol:  reports no history of alcohol use. ?Tobacco:  reports that she has never smoked. She has never used smokeless tobacco. ?Social: Ms. Stueve  reports that she has never smoked. She has never used smokeless tobacco. She reports that she does not drink alcohol and does not use drugs. ?Medical:  has a past  medical history of Allergic rhinitis, Allergy, Anemia,  Anxiety, Aortic stenosis (09/13/2016), Aortic valve endocarditis (12/25/2018), Arthritis, Back pain, Bradycardia, Breast mass, Charcot's joint of ankle, left, CHF (congestive heart failure) ([place]) (07/09/2007), CKD (chronic kidney disease), stage III ([place]), Complication of anesthesia, Constipation, Diabetic nephropathy ([place]), Dyspnea, Family history of adverse reaction to anesthesia, GERD (gastroesophageal reflux disease), Heart murmur, HLD (hyperlipidemia), Hyperparathyroidism ([place]), Hypertension, Joint pain, Leg edema, Legally blind in left eye, as defined in [place], Lymphedema, Mild pulmonary hypertension ([place]) (09/13/2016), Obesity, Onychomycosis, PONV (postoperative nausea and vomiting), S/P BKA (below knee amputation) unilateral, left ([place]), Sepsis ([place]) (12/2018), and T2DM (type 2 diabetes mellitus) ([place]). ?Surgical: Ms. Totman  has a past surgical history that includes Abdominal hysterectomy; TEE without cardioversion (N/A, 12/28/2018); IR Fluoro Guide CV Line Right (12/29/2018); IR US Guide Vasc Access Right (12/29/2018); IR Removal Tun Cv Cath W/O FL (02/19/2019); Breast biopsy (Left, 2014); Eye surgery (Left, 2007); Amputation (Left, 05/05/2019); AV fistula placement (Right, 09/19/2021); and AV fistula placement (Left, 11/09/2021). ?Family: family history includes Breast cancer (age of onset: 70) in her sister; Diabetes in her mother and sister; Eating disorder in her mother; Hyperlipidemia in her mother; Hypertension in her mother; Obesity in her mother. ? ?Laboratory Chemistry Profile  ? ?Renal ?Lab Results  ?Component Value Date  ? BUN 76 (H) 11/09/2021  ? CREATININE 4.60 (H) 11/09/2021  ? BCR 17 06/25/2018  ? GFRAA 17 (L) 05/28/2019  ? GFRNONAA 14 (L) 10/31/2021  ? ?  Hepatic ?Lab Results  ?Component Value Date  ? AST 23 10/15/2021  ? ALT 16 10/15/2021  ? ALBUMIN 4.1 10/15/2021  ? ALKPHOS 112 10/15/2021  ? LIPASE 72 (H) 10/15/2021  ? ?   ?Electrolytes ?Lab Results  ?Component Value Date  ? NA 134 (L) 11/09/2021  ? K 4.3 11/09/2021  ? CL 98 11/09/2021  ? CALCIUM 8.7 (L) 10/31/2021  ? MG 2.4 05/12/2019  ? PHOS 4.5 09/09/2014  ? ?  Bone ?Lab Results  ?Component Value Date  ? VD25OH 28.5 (L) 06/25/2018  ? ?  ?Inflammation (CRP: Acute Phase) (ESR: Chronic Phase) ?Lab Results  ?Component Value Date  ? CRP 29.7 (H) 12/26/2018  ? ESRSEDRATE 102 (H) 12/26/2018  ? LATICACIDVEN 2.1 ([place]) 12/23/2018  ? ?    ?Note: Above Lab results reviewed. ? ?Recent Imaging Review  ?VAS US DUPLEX DIALYSIS ACCESS (AVF, AVG) ?DIALYSIS ACCESS ? ?Patient Name:  SHONTAY WALLNER [facility]  Date of Exam:   12/31/2021 ?Medical Rec #: [id number]         Accession #:    [id number] ?Date of Birth: 02-14-1961         Patient Gender: F ?Patient Age:   43 years ?Exam Location:  [place] ?Procedure:      VAS US DUPLEX DIALYSIS ACCESS (AVF, AVG) ?Referring Phys: [facility] ? ?-------------------------------------------------------------------------------- ?  ?Access Site: Left Upper Extremity. ? ?Access Type: Brachial-cephalic AVF. ? ?History: 11/09/2021: Placement of Left Brachial Cephalic AVF. ? ?Performing Technologist: Almira Coaster RVS ? ?  ?Examination Guidelines: A complete evaluation includes B-mode imaging, spectral ?Doppler, color Doppler, and power Doppler as needed of all accessible portions ?of each vessel. Unilateral testing is considered an integral part of a complete ?examination. Limited examinations for reoccurring indications may be performed ?as noted. ? ?  ?Findings: ?+--------------------+----------+-----------------+--------+ ?AVF                 PSV (cm/s)Flow Vol (mL/min)Comments ?+--------------------+----------+-----------------+--------+ ?Native artery inflow   321          1359                ?+--------------------+----------+-----------------+--------+ ?  AVF Anastomosis        507                               ?+--------------------+----------+-----------------+--------+ ? ?  ?+---------------+----------+-------------+----------+--------+ ?OUTFLOW VEIN   PSV (cm/s)Diameter (cm)Depth (cm)Describe ?+---------------+----------+-------------+----------+--------+ ?[place]

## 2022-02-12 NOTE — Progress Notes (Signed)
Safety precautions to be maintained throughout the outpatient stay will include: orient to surroundings, keep bed in low position, maintain call bell within reach at all times, provide assistance with transfer out of bed and ambulation.  

## 2022-03-04 ENCOUNTER — Ambulatory Visit (HOSPITAL_BASED_OUTPATIENT_CLINIC_OR_DEPARTMENT_OTHER): Payer: Medicare Other | Admitting: Student in an Organized Health Care Education/Training Program

## 2022-03-04 ENCOUNTER — Encounter: Payer: Self-pay | Admitting: Student in an Organized Health Care Education/Training Program

## 2022-03-04 ENCOUNTER — Other Ambulatory Visit: Payer: Self-pay

## 2022-03-04 ENCOUNTER — Ambulatory Visit
Admission: RE | Admit: 2022-03-04 | Discharge: 2022-03-04 | Disposition: A | Discharge status: Home / Self Care | Payer: Medicare Other | Source: Ambulatory Visit | Attending: Student in an Organized Health Care Education/Training Program | Admitting: Student in an Organized Health Care Education/Training Program

## 2022-03-04 VITALS — BP 151/62 | HR 74 | Temp 97.0°F | Resp 16 | Ht 66.0 in | Wt 300.0 lb

## 2022-03-04 DIAGNOSIS — M1711 Unilateral primary osteoarthritis, right knee: Secondary | ICD-10-CM | POA: Insufficient documentation

## 2022-03-04 DIAGNOSIS — G894 Chronic pain syndrome: Secondary | ICD-10-CM | POA: Insufficient documentation

## 2022-03-04 DIAGNOSIS — G8929 Other chronic pain: Secondary | ICD-10-CM | POA: Diagnosis not present

## 2022-03-04 DIAGNOSIS — M25561 Pain in right knee: Secondary | ICD-10-CM

## 2022-03-04 MED ORDER — MIDAZOLAM HCL 5 MG/5ML IJ SOLN
INTRAMUSCULAR | Status: AC
  Filled 2022-03-04: qty 5

## 2022-03-04 MED ORDER — ROPIVACAINE HCL 2 MG/ML IJ SOLN
INTRAMUSCULAR | Status: AC
  Filled 2022-03-04: qty 20

## 2022-03-04 MED ORDER — DEXAMETHASONE SODIUM PHOSPHATE 10 MG/ML IJ SOLN
10.0000 mg | Freq: Once | INTRAMUSCULAR | Status: AC
  Administered 2022-03-04: 10 mg

## 2022-03-04 MED ORDER — LIDOCAINE HCL 2 % IJ SOLN
INTRAMUSCULAR | Status: AC
  Filled 2022-03-04: qty 20

## 2022-03-04 MED ORDER — LIDOCAINE HCL 2 % IJ SOLN
20.0000 mL | Freq: Once | INTRAMUSCULAR | Status: AC
  Administered 2022-03-04: 400 mg

## 2022-03-04 MED ORDER — DEXAMETHASONE SODIUM PHOSPHATE 10 MG/ML IJ SOLN
INTRAMUSCULAR | Status: AC
  Filled 2022-03-04: qty 1

## 2022-03-04 MED ORDER — MIDAZOLAM HCL 5 MG/5ML IJ SOLN
0.5000 mg | Freq: Once | INTRAMUSCULAR | Status: AC
  Administered 2022-03-04: 1.5 mg via INTRAVENOUS

## 2022-03-04 MED ORDER — ROPIVACAINE HCL 2 MG/ML IJ SOLN
9.0000 mL | Freq: Once | INTRAMUSCULAR | Status: AC
  Administered 2022-03-04: 9 mL via PERINEURAL

## 2022-03-04 NOTE — Progress Notes (Signed)
Safety precautions to be maintained throughout the outpatient stay will include: orient to surroundings, keep bed in low position, maintain call bell within reach at all times, provide assistance with transfer out of bed and ambulation.  

## 2022-03-04 NOTE — Progress Notes (Signed)
PROVIDER NOTE: Interpretation of information contained herein should be left to medically-trained personnel. Specific patient instructions are provided elsewhere under "Patient Instructions" section of medical record. This document was created in part using STT-dictation technology, any transcriptional errors that may result from this process are unintentional.  ?Patient: Rachel Holmes ?Type: Established ?DOB: 1961-04-19 ?MRN: [id number] ?PCP: Sharyne Peach, MD  Service: Procedure ?DOS: 03/04/2022 ?Setting: Ambulatory ?Location: Ambulatory outpatient facility ?Delivery: Face-to-face Provider: Gillis Santa, MD ?Specialty: Interventional Pain Management ?Specialty designation: 09 ?Location: Outpatient facility ?Ref. Prov.: Sharyne Peach, MD   ? ?Primary Reason for Visit: Interventional Pain Management Treatment. ?CC: No chief complaint on file. ? ?  ?Procedure:          Anesthesia, Analgesia, Anxiolysis:  ?Type: Therapeutic Superolateral, Superomedial, and Inferomedial, Genicular Nerve Radiofrequency Ablation (destruction).   #1  ?Region: Lateral, Anterior, and Medial aspects of the knee joint, above and below the knee joint proper. ?Level: Superior and inferior to the knee joint. ?Laterality: Right  Anesthesia: Local (1-2% Lidocaine)  ? ?Sedation: Minimal  ?Guidance: Fluoroscopy         ? ? ?Position: Supine  ? ?Indications: ?1. Primary osteoarthritis of right knee   ?2. Chronic pain of right knee   ?3. Chronic pain syndrome   ? ?Rachel Holmes has been dealing with the above chronic pain for longer than three months and has either failed to respond, was unable to tolerate, or simply did not get enough benefit from other more conservative therapies including, but not limited to: ?1. Over-the-counter medications ?2. Anti-inflammatory medications ?3. Muscle relaxants ?4. Membrane stabilizers ?5. Opioids ?6. Physical therapy and/or chiropractic manipulation ?7. Modalities (Heat, ice, etc.) ?8. Invasive techniques  such as nerve blocks. ?Rachel Holmes has attained more than 50% relief of the pain from a series of diagnostic injections conducted in separate occasions. ? ?Pain Score: ?Pre-procedure: 8/10 ?Post-procedure: 4  (standing)/10 ? ?  ?Pre-op H&P Assessment:  ?Rachel Holmes is a 61 y.o. (year old), female patient, seen today for interventional treatment. She  has a past surgical history that includes Abdominal hysterectomy; TEE without cardioversion (N/A, 12/28/2018); IR Fluoro Guide CV Line Right (12/29/2018); IR US Guide Vasc Access Right (12/29/2018); IR Removal Tun Cv Cath W/O FL (02/19/2019); Breast biopsy (Left, 2014); Eye surgery (Left, 2007); Amputation (Left, 05/05/2019); AV fistula placement (Right, 09/19/2021); and AV fistula placement (Left, 11/09/2021). Rachel Holmes has a current medication list which includes the following prescription(s): acetaminophen, albuterol, aspirin, one touch ultra 2, calcitriol, cetirizine, clobetasol cream, clonidine, freestyle libre 14 day reader, freestyle libre 14 day sensor, docusate sodium, doxazosin, duloxetine, ezetimibe, gabapentin, gabapentin, insulin lispro, insulin pen needle, isosorbide mononitrate, onetouch ultrasoft, methocarbamol, multivitamin with minerals, olmesartan, omeprazole, ondansetron, onetouch verio, silver sulfadiazine, torsemide, tramadol, trulicity, and vitamin T-06. Her primarily concern today is the No chief complaint on file. ? ?Initial Vital Signs:  ?Pulse/HCG Rate: 60  ?Temp: (!) 97 ?F (36.1 ?C) ?Resp: 16 ?BP: (!) 168/63 ?SpO2: 100 % ? ?BMI: Estimated body mass index is 48.42 kg/m? as calculated from the following: ?  Height as of this encounter: 5\' 6"  (1.676 m). ?  Weight as of this encounter: 300 lb (136.1 kg). ? ?Risk Assessment: ?Allergies: Reviewed. She is allergic to nifedipine, penicillins, statins, hydralazine, hydrocodone, oxycodone, codeine, and ibuprofen.  ?Allergy Precautions: None required ?Coagulopathies: Reviewed. None identified.   ?Blood-thinner therapy: None at this time ?Active Infection(s): Reviewed. None identified. Rachel Holmes is afebrile ? ?Site Confirmation: Rachel Holmes was asked to confirm the procedure  and laterality before marking the site ?Procedure checklist: Completed ?Consent: Before the procedure and under the influence of no sedative(s), amnesic(s), or anxiolytics, the patient was informed of the treatment options, risks and possible complications. To fulfill our ethical and legal obligations, as recommended by the American Medical Association's Code of Ethics, I have informed the patient of my clinical impression; the nature and purpose of the treatment or procedure; the risks, benefits, and possible complications of the intervention; the alternatives, including doing nothing; the risk(s) and benefit(s) of the alternative treatment(s) or procedure(s); and the risk(s) and benefit(s) of doing nothing. ?The patient was provided information about the general risks and possible complications associated with the procedure. These may include, but are not limited to: failure to achieve desired goals, infection, bleeding, organ or nerve damage, allergic reactions, paralysis, and death. ?In addition, the patient was informed of those risks and complications associated to the procedure, such as failure to decrease pain; infection; bleeding; organ or nerve damage with subsequent damage to sensory, motor, and/or autonomic systems, resulting in permanent pain, numbness, and/or weakness of one or several areas of the body; allergic reactions; (i.e.: anaphylactic reaction); and/or death. ?Furthermore, the patient was informed of those risks and complications associated with the medications. These include, but are not limited to: allergic reactions (i.e.: anaphylactic or anaphylactoid reaction(s)); adrenal axis suppression; blood sugar elevation that in diabetics may result in ketoacidosis or comma; water retention that in patients with  history of congestive heart failure may result in shortness of breath, pulmonary edema, and decompensation with resultant heart failure; weight gain; swelling or edema; medication-induced neural toxicity; particulate matter embolism and blood vessel occlusion with resultant organ, and/or nervous system infarction; and/or aseptic necrosis of one or more joints. ?Finally, the patient was informed that Medicine is not an exact science; therefore, there is also the possibility of unforeseen or unpredictable risks and/or possible complications that may result in a catastrophic outcome. The patient indicated having understood very clearly. We have given the patient no guarantees and we have made no promises. Enough time was given to the patient to ask questions, all of which were answered to the patient's satisfaction. Ms. Harvie has indicated that she wanted to continue with the procedure. ?Attestation: I, the ordering provider, attest that I have discussed with the patient the benefits, risks, side-effects, alternatives, likelihood of achieving goals, and potential problems during recovery for the procedure that I have provided informed consent. ?Date  Time: 03/04/2022  9:07 AM ? ?Pre-Procedure Preparation:  ?Monitoring: As per clinic protocol. Respiration, ETCO2, SpO2, BP, heart rate and rhythm monitor placed and checked for adequate function ?Safety Precautions: Patient was assessed for positional comfort and pressure points before starting the procedure. ?Time-out: I initiated and conducted the "Time-out" before starting the procedure, as per protocol. The patient was asked to participate by confirming the accuracy of the "Time Out" information. Verification of the correct person, site, and procedure were performed and confirmed by me, the nursing staff, and the patient. "Time-out" conducted as per Joint Commission's Universal Protocol (UP.01.01.01). ?Time: [id number] ? ?Description of Procedure:          ?Target Area:  For Genicular Nerve radiofrequency ablation (destruction), the targets are: the superolateral genicular nerve, located in the lateral distal portion of the femoral shaft as it curves to form the lateral epicondyle,

## 2022-03-04 NOTE — Patient Instructions (Signed)

## 2022-03-05 ENCOUNTER — Telehealth: Payer: Self-pay

## 2022-03-05 NOTE — Telephone Encounter (Signed)
Post procedure phone call.  Patient states she is doing well.  

## 2022-03-18 ENCOUNTER — Encounter: Payer: Self-pay | Admitting: Student in an Organized Health Care Education/Training Program

## 2022-03-18 ENCOUNTER — Ambulatory Visit
Payer: Medicare Other | Attending: Student in an Organized Health Care Education/Training Program | Admitting: Student in an Organized Health Care Education/Training Program

## 2022-03-18 VITALS — BP 161/64 | HR 66 | Temp 97.2°F | Resp 16 | Ht 66.0 in | Wt 300.0 lb

## 2022-03-18 DIAGNOSIS — G894 Chronic pain syndrome: Secondary | ICD-10-CM | POA: Diagnosis not present

## 2022-03-18 DIAGNOSIS — E1140 Type 2 diabetes mellitus with diabetic neuropathy, unspecified: Secondary | ICD-10-CM | POA: Diagnosis not present

## 2022-03-18 MED ORDER — CAPSAICIN-CLEANSING GEL 8 % EX KIT
2.0000 | PACK | Freq: Once | CUTANEOUS | Status: AC
  Administered 2022-03-18: 2 via TOPICAL
  Filled 2022-03-18: qty 2

## 2022-03-18 NOTE — Progress Notes (Signed)
Safety precautions to be maintained throughout the outpatient stay will include: orient to surroundings, keep bed in low position, maintain call bell within reach at all times, provide assistance with transfer out of bed and ambulation.  

## 2022-03-18 NOTE — Progress Notes (Signed)
PROVIDER NOTE: Interpretation of information contained herein should be left to medically-trained personnel. Specific patient instructions are provided elsewhere under "Patient Instructions" section of medical record. This document was created in part using STT-dictation technology, any transcriptional errors that may result from this process are unintentional.  ?Patient: Rachel Holmes ?Type: Established ?DOB: 13-May-1961 ?MRN: [id number] ?PCP: Sharyne Peach, MD  Service: Procedure ?DOS: 03/18/2022 ?Setting: Ambulatory ?Location: Ambulatory outpatient facility ?Delivery: Face-to-face Provider: Gillis Santa, MD ?Specialty: Interventional Pain Management ?Specialty designation: 09 ?Location: Outpatient facility ?Ref. Prov.: Sharyne Peach, MD   ? ?Primary Reason for Visit: Interventional Pain Management Treatment. ?CC: Foot Pain (right) ? ?  ?Procedure:           ?Qutenza neurolysis for painful diabetic neuropathy of the right foot   ? ?1. Painful diabetic neuropathy ([place])   ?2. Chronic pain syndrome   ? ?NAS-11 Pain score:  ? Pre-procedure: 2 /10  ? Post-procedure: 2 /10  ? ?  ?Pre-op H&P Assessment:  ?Rachel Holmes is a 61 y.o. (year old), female patient, seen today for interventional treatment. She  has a past surgical history that includes Abdominal hysterectomy; TEE without cardioversion (N/A, 12/28/2018); IR Fluoro Guide CV Line Right (12/29/2018); IR US Guide Vasc Access Right (12/29/2018); IR Removal Tun Cv Cath W/O FL (02/19/2019); Breast biopsy (Left, 2014); Eye surgery (Left, 2007); Amputation (Left, 05/05/2019); AV fistula placement (Right, 09/19/2021); and AV fistula placement (Left, 11/09/2021). Rachel Holmes has a current medication list which includes the following prescription(s): acetaminophen, albuterol, aspirin, one touch ultra 2, calcitriol, cetirizine, clobetasol cream, clonidine, freestyle libre 14 day reader, freestyle libre 14 day sensor, docusate sodium, doxazosin, duloxetine, ezetimibe,  gabapentin, gabapentin, insulin lispro, insulin pen needle, isosorbide mononitrate, onetouch ultrasoft, methocarbamol, multivitamin with minerals, olmesartan, omeprazole, ondansetron, onetouch verio, silver sulfadiazine, torsemide, tramadol, trulicity, and vitamin A-12. Her primarily concern today is the Foot Pain (right) ? ?Initial Vital Signs:  ?Pulse/HCG Rate: 66  ?Temp: (!) 97.2 ?F (36.2 ?C) ?Resp: 16 ?BP: (!) 161/64 ?SpO2: 100 % ? ?BMI: Estimated body mass index is 48.42 kg/m? as calculated from the following: ?  Height as of this encounter: 5\' 6"  (1.676 m). ?  Weight as of this encounter: 300 lb (136.1 kg). ? ?Risk Assessment: ?Allergies: Reviewed. She is allergic to nifedipine, penicillins, statins, hydralazine, hydrocodone, oxycodone, codeine, and ibuprofen.  ?Allergy Precautions: None required ?Coagulopathies: Reviewed. None identified.  ?Blood-thinner therapy: None at this time ?Active Infection(s): Reviewed. None identified. Rachel Holmes is afebrile ? ?Site Confirmation: Ms. Tamura was asked to confirm the procedure and laterality before marking the site ?Procedure checklist: Completed ?Consent: Before the procedure and under the influence of no sedative(s), amnesic(s), or anxiolytics, the patient was informed of the treatment options, risks and possible complications. To fulfill our ethical and legal obligations, as recommended by the American Medical Association's Code of Ethics, I have informed the patient of my clinical impression; the nature and purpose of the treatment or procedure; the risks, benefits, and possible complications of the intervention; the alternatives, including doing nothing; the risk(s) and benefit(s) of the alternative treatment(s) or procedure(s); and the risk(s) and benefit(s) of doing nothing. ?The patient was provided information about the general risks and possible complications associated with the procedure. These may include, but are not limited to: failure to achieve  desired goals, infection, bleeding, organ or nerve damage, allergic reactions, paralysis, and death. ?In addition, the patient was informed of those risks and complications associated to the procedure, such as failure to decrease  pain; infection; bleeding; organ or nerve damage with subsequent damage to sensory, motor, and/or autonomic systems, resulting in permanent pain, numbness, and/or weakness of one or several areas of the body; allergic reactions; (i.e.: anaphylactic reaction); and/or death. ?Furthermore, the patient was informed of those risks and complications associated with the medications. These include, but are not limited to: allergic reactions (i.e.: anaphylactic or anaphylactoid reaction(s)); adrenal axis suppression; blood sugar elevation that in diabetics may result in ketoacidosis or comma; water retention that in patients with history of congestive heart failure may result in shortness of breath, pulmonary edema, and decompensation with resultant heart failure; weight gain; swelling or edema; medication-induced neural toxicity; particulate matter embolism and blood vessel occlusion with resultant organ, and/or nervous system infarction; and/or aseptic necrosis of one or more joints. ?Finally, the patient was informed that Medicine is not an exact science; therefore, there is also the possibility of unforeseen or unpredictable risks and/or possible complications that may result in a catastrophic outcome. The patient indicated having understood very clearly. We have given the patient no guarantees and we have made no promises. Enough time was given to the patient to ask questions, all of which were answered to the patient's satisfaction. Rachel Holmes has indicated that she wanted to continue with the procedure. ?Attestation: I, the ordering provider, attest that I have discussed with the patient the benefits, risks, side-effects, alternatives, likelihood of achieving goals, and potential problems  during recovery for the procedure that I have provided informed consent. ?Date  Time: 03/18/2022 10:59 AM ? ?Description of Procedure:          ? ?Area Prepped: Entire foot Region ? ? ?   ?    ?      ? ?     ? ? ? ? ? ?Vitals:  ? 03/18/22 1105  ?BP: (!) 161/64  ?Pulse: 66  ?Resp: 16  ?Temp: (!) 97.2 ?F (36.2 ?C)  ?TempSrc: Temporal  ?SpO2: 100%  ?Weight: 300 lb (136.1 kg)  ?Height: 5\' 6"  (1.676 m)  ?  ?Start Time: 1116 hrs. ?End Time: 1211 hrs. ? ? ?1 Qutenza patch was applied to the right plantar surface, 1 Qutenza patch applied to the right dorsal surface of her right foot. ? ?Post-operative Assessment:  ?Post-procedure Vital Signs:  ?Pulse/HCG Rate: 66  ?Temp: (!) 97.2 ?F (36.2 ?C) ?Resp: 16 ?BP: (!) 161/64 ?SpO2: 100 % ? ?EBL: None ? ?Complications: No immediate post-treatment complications observed by team, or reported by patient. ? ?Note: The patient tolerated the entire procedure well. A repeat set of vitals were taken after the procedure and the patient was kept under observation following institutional policy, for this type of procedure. Post-procedural neurological assessment was performed, showing return to baseline, prior to discharge. The patient was provided with post-procedure discharge instructions, including a section on how to identify potential problems. Should any problems arise concerning this procedure, the patient was given instructions to immediately contact us, at any time, without hesitation. In any case, we plan to contact the patient by telephone for a follow-up status report regarding this interventional procedure. ? ?Comments:  No additional relevant information. ? ?Plan of Care  ?Orders:  ?No orders of the defined types were placed in this encounter. ? ? ?Medications ordered for procedure: ?Meds ordered this encounter  ?Medications  ? capsaicin topical system 8 % patch 2 patch  ? ?Medications administered: ?We administered capsaicin topical system.  ?See the medical record for exact  dosing, route, and time of administration. ? ?  Follow-up plan:   ?Return for Keep sch. appt.    ? ?  s/p R knee hyalgan 08/30/2019 and 09/13/2019, R knee steroid injection on 09/29/2019- not helpful,  R knee GNB-

## 2022-03-18 NOTE — Patient Instructions (Signed)
Capsaicin Patches ?What is this medication? ?CAPSAICIN (cap SAY sin) relieves minor pain in your muscles and joints. It works by making your skin feel warm or cool, which blocks pain signals going to the brain. ?This medicine may be used for other purposes; ask your health care provider or pharmacist if you have questions. ?COMMON BRAND NAME(S): Qutenza ?What should I tell my care team before I take this medication? ?They need to know if you have any of these conditions: ?Broken or irritated skin ?High blood pressure ?History of heart attack or stroke ?An unusual or allergic reaction to capsaicin, hot peppers, other medications, foods, dyes, or preservatives ?Pregnant or trying to get pregnant ?Breast-feeding ?How should I use this medication? ?This medication is for external use only. It is applied by your care team in a hospital or clinic setting. ?Talk to your care team about the use of this medication in children. Special care may be needed. ?Overdosage: If you think you have taken too much of this medicine contact a poison control center or emergency room at once. ?NOTE: This medicine is only for you. Do not share this medicine with others. ?What if I miss a dose? ?This does not apply. ?What may interact with this medication? ?Interactions are not expected. Do not use any other skin products on the affected area without asking your care team. ?This list may not describe all possible interactions. Give your health care provider a list of all the medicines, herbs, non-prescription drugs, or dietary supplements you use. Also tell them if you smoke, drink alcohol, or use illegal drugs. Some items may interact with your medicine. ?What should I watch for while using this medication? ?Your condition will be monitored carefully while you are receiving this medication. Your blood pressure may go up during the procedure. ?Do not touch the medication patch during treatment. This medication causes red, burning skin. You  may need pain medication for during and after the procedure. ?This medication can make you more sensitive to heat for a few days after treatment. Be careful in hot showers or baths. Keep out of the sun. Exercise may make the treated skin feel hotter. ?Tell your care team if your symptoms do not start to get better or if they get worse. ?What side effects may I notice from receiving this medication? ?Side effects that you should report to your care team as soon as possible: ?Allergic reactions--skin rash, itching, hives, swelling of the face, lips, tongue, or throat ?Side effects that usually do not require medical attention (report these to your care team if they continue or are bothersome): ?Mild skin irritation, redness, or dryness ?This list may not describe all possible side effects. Call your doctor for medical advice about side effects. You may report side effects to FDA at 1-800-FDA-1088. ?Where should I keep my medication? ?This medication is given in a hospital or clinic. It will not be stored at home. ?NOTE: This sheet is a summary. It may not cover all possible information. If you have questions about this medicine, talk to your doctor, pharmacist, or health care provider. ?? 2023 Elsevier/Gold Standard (2021-08-21 00:00:00) ? ?

## 2022-03-19 ENCOUNTER — Telehealth: Payer: Self-pay | Admitting: *Deleted

## 2022-03-19 NOTE — Telephone Encounter (Signed)
Called for post procedure check. No answer. LVM. 

## 2022-03-26 ENCOUNTER — Telehealth (INDEPENDENT_AMBULATORY_CARE_PROVIDER_SITE_OTHER): Payer: Self-pay

## 2022-03-26 NOTE — Telephone Encounter (Signed)
Spoke with the patient and she is scheduled with Dr. Lucky Cowboy for a permcath insertion on 03/28/22 with a 8:00 am arrival time to the [facility]. Pre-procedure instructions were discussed and patient stated she understood.

## 2022-03-28 ENCOUNTER — Encounter
Admission: RE | Disposition: A | Discharge status: Home / Self Care | Payer: Self-pay | Source: Home / Self Care | Attending: Vascular Surgery

## 2022-03-28 ENCOUNTER — Other Ambulatory Visit: Payer: Self-pay

## 2022-03-28 ENCOUNTER — Ambulatory Visit
Admission: RE | Admit: 2022-03-28 | Discharge: 2022-03-28 | Disposition: A | Discharge status: Home / Self Care | Payer: Medicare Other | Attending: Vascular Surgery | Admitting: Vascular Surgery

## 2022-03-28 ENCOUNTER — Encounter: Payer: Self-pay | Admitting: Vascular Surgery

## 2022-03-28 DIAGNOSIS — I132 Hypertensive heart and chronic kidney disease with heart failure and with stage 5 chronic kidney disease, or end stage renal disease: Secondary | ICD-10-CM | POA: Diagnosis present

## 2022-03-28 DIAGNOSIS — Z992 Dependence on renal dialysis: Secondary | ICD-10-CM | POA: Diagnosis not present

## 2022-03-28 DIAGNOSIS — I509 Heart failure, unspecified: Secondary | ICD-10-CM | POA: Insufficient documentation

## 2022-03-28 DIAGNOSIS — N186 End stage renal disease: Secondary | ICD-10-CM | POA: Diagnosis not present

## 2022-03-28 DIAGNOSIS — E1122 Type 2 diabetes mellitus with diabetic chronic kidney disease: Secondary | ICD-10-CM | POA: Insufficient documentation

## 2022-03-28 HISTORY — PX: DIALYSIS/PERMA CATHETER INSERTION: CATH118288

## 2022-03-28 LAB — GLUCOSE, CAPILLARY: Glucose-Capillary: 274 mg/dL — ABNORMAL HIGH (ref 70–99)

## 2022-03-28 LAB — POTASSIUM (ARMC VASCULAR LAB ONLY): Potassium (ARMC vascular lab): 4.7 mmol/L (ref 3.5–5.1)

## 2022-03-28 SURGERY — DIALYSIS/PERMA CATHETER INSERTION
Anesthesia: Moderate Sedation

## 2022-03-28 MED ORDER — ONDANSETRON HCL 4 MG/2ML IJ SOLN: 4.0000 mg | Freq: Four times a day (QID) | INTRAMUSCULAR | Status: DC | PRN

## 2022-03-28 MED ORDER — MIDAZOLAM HCL 2 MG/2ML IJ SOLN
INTRAMUSCULAR | Status: DC | PRN
  Administered 2022-03-28: 1 mg via INTRAVENOUS

## 2022-03-28 MED ORDER — FENTANYL CITRATE (PF) 100 MCG/2ML IJ SOLN: 12.5000 ug | Freq: Once | INTRAMUSCULAR | Status: DC | PRN

## 2022-03-28 MED ORDER — LACTATED RINGERS IV SOLN: 1000.0000 mL | Freq: Once | INTRAVENOUS | Status: DC

## 2022-03-28 MED ORDER — METHYLPREDNISOLONE SODIUM SUCC 125 MG IJ SOLR: 125.0000 mg | Freq: Once | INTRAMUSCULAR | Status: DC | PRN

## 2022-03-28 MED ORDER — MIDAZOLAM HCL 2 MG/2ML IJ SOLN
INTRAMUSCULAR | Status: AC
  Filled 2022-03-28: qty 2

## 2022-03-28 MED ORDER — FENTANYL CITRATE (PF) 100 MCG/2ML IJ SOLN
INTRAMUSCULAR | Status: DC | PRN
  Administered 2022-03-28: 50 ug via INTRAVENOUS

## 2022-03-28 MED ORDER — FENTANYL CITRATE PF 50 MCG/ML IJ SOSY
PREFILLED_SYRINGE | INTRAMUSCULAR | Status: AC
  Filled 2022-03-28: qty 1

## 2022-03-28 MED ORDER — SODIUM CHLORIDE 0.9 % IV SOLN: INTRAVENOUS | Status: DC

## 2022-03-28 MED ORDER — VANCOMYCIN HCL 1500 MG/300ML IV SOLN
1500.0000 mg | INTRAVENOUS | Status: AC
  Administered 2022-03-28: 1500 mg via INTRAVENOUS
  Filled 2022-03-28: qty 300

## 2022-03-28 MED ORDER — FAMOTIDINE 20 MG PO TABS: 40.0000 mg | ORAL_TABLET | Freq: Once | ORAL | Status: DC | PRN

## 2022-03-28 MED ORDER — MIDAZOLAM HCL 2 MG/ML PO SYRP: 8.0000 mg | ORAL_SOLUTION | Freq: Once | ORAL | Status: DC | PRN

## 2022-03-28 MED ORDER — DIPHENHYDRAMINE HCL 50 MG/ML IJ SOLN: 50.0000 mg | Freq: Once | INTRAMUSCULAR | Status: DC | PRN

## 2022-03-28 SURGICAL SUPPLY — 8 items
BIOPATCH RED 1 DISK 7.0 (GAUZE/BANDAGES/DRESSINGS) ×1 IMPLANT
CATH CANNON HEMO 15FR 19 (HEMODIALYSIS SUPPLIES) ×1 IMPLANT
COVER PROBE U/S 5X48 (MISCELLANEOUS) ×1 IMPLANT
DERMABOND ADVANCED (GAUZE/BANDAGES/DRESSINGS) ×1
DERMABOND ADVANCED .7 DNX12 (GAUZE/BANDAGES/DRESSINGS) IMPLANT
PACK ANGIOGRAPHY (CUSTOM PROCEDURE TRAY) ×1 IMPLANT
SUT MNCRL AB 4-0 PS2 18 (SUTURE) ×1 IMPLANT
SUT PROLENE 0 CT 1 30 (SUTURE) ×1 IMPLANT

## 2022-03-28 NOTE — Op Note (Signed)
OPERATIVE NOTE    PRE-OPERATIVE DIAGNOSIS: 1. ESRD   POST-OPERATIVE DIAGNOSIS: same as above  PROCEDURE: Ultrasound guidance for vascular access to the right internal jugular vein Fluoroscopic guidance for placement of catheter Placement of a 19 cm tip to cuff tunneled hemodialysis catheter via the right internal jugular vein  SURGEON: Leotis Pain, MD  ANESTHESIA:  Local with Moderate conscious sedation for approximately 16 minutes using 1 mg of Versed and 50 mcg of Fentanyl  ESTIMATED BLOOD LOSS: 3 cc  FLUORO TIME: less than one minute  CONTRAST: none  FINDING(S): 1.  Patent right internal jugular vein  SPECIMEN(S):  None  INDICATIONS:   Rachel Holmes is a 61 y.o.female who presents with renal failure.  The patient needs long term dialysis access for their ESRD, and a Permcath is necessary.  Risks and benefits are discussed and informed consent is obtained.    DESCRIPTION: After obtaining full informed written consent, the patient was brought back to the vascular suited. The patient's right neck and chest were sterilely prepped and draped in a sterile surgical field was created. Moderate conscious sedation was administered during a face to face encounter with the patient throughout the procedure with my supervision of the RN administering medicines and monitoring the patient's vital signs, pulse oximetry, telemetry and mental status throughout from the start of the procedure until the patient was taken to the recovery room.  The right internal jugular vein was visualized with ultrasound and found to be patent. It was then accessed under direct ultrasound guidance and a permanent image was recorded. A wire was placed. After skin nick and dilatation, the peel-away sheath was placed over the wire. I then turned my attention to an area under the clavicle. Approximately 1-2 fingerbreadths below the clavicle a small counterincision was created and tunneled from the subclavicular  incision to the access site. Using fluoroscopic guidance, a 19 centimeter tip to cuff tunneled hemodialysis catheter was selected, and tunneled from the subclavicular incision to the access site. It was then placed through the peel-away sheath and the peel-away sheath was removed. Using fluoroscopic guidance the catheter tips were parked in the right atrium. The appropriate distal connectors were placed. It withdrew blood well and flushed easily with heparinized saline and a concentrated heparin solution was then placed. It was secured to the chest wall with 2 Prolene sutures. The access incision was closed single 4-0 Monocryl. A 4-0 Monocryl pursestring suture was placed around the exit site. Sterile dressings were placed. The patient tolerated the procedure well and was taken to the recovery room in stable condition.  COMPLICATIONS: None  CONDITION: Stable  Leotis Pain, MD 03/28/2022 9:45 AM   This note was created with Dragon Medical transcription system. Any errors in dictation are purely unintentional.

## 2022-03-28 NOTE — H&P (Signed)
[facility] Admission History & Physical  MRN : [id number]  Rachel Holmes is a 61 y.o. (November 09, 1960) female who presents with chief complaint of No chief complaint on file. [name]  History of Present Illness: Patient presents today for PermCath placement.  Has had longstanding severe chronic kidney disease and is now progressed to need for hemodialysis and end-stage renal disease.  Has had 2 fistulas placed in the past but none are currently usable for dialysis so she will need a catheter placement.  No fevers or chills today.  Current Facility-Administered Medications  Medication Dose Route Frequency Provider Last Rate Last Admin   0.9 %  sodium chloride infusion   Intravenous Continuous Kris Hartmann, NP       diphenhydrAMINE (BENADRYL) injection 50 mg  50 mg Intravenous Once PRN Kris Hartmann, NP       famotidine (PEPCID) tablet 40 mg  40 mg Oral Once PRN Kris Hartmann, NP       fentaNYL (SUBLIMAZE) injection 12.5 mcg  12.5 mcg Intravenous Once PRN Kris Hartmann, NP       lactated ringers infusion 1,000 mL  1,000 mL Intravenous Once Gillis Santa, MD       methylPREDNISolone sodium succinate (SOLU-MEDROL) 125 mg/2 mL injection 125 mg  125 mg Intravenous Once PRN Kris Hartmann, NP       midazolam (VERSED) 2 MG/ML syrup 8 mg  8 mg Oral Once PRN Kris Hartmann, NP       ondansetron [facility]) injection 4 mg  4 mg Intravenous Q6H PRN Kris Hartmann, NP       vancomycin (VANCOREADY) IVPB 1500 mg/300 mL  1,500 mg Intravenous 120 min pre-op Kris Hartmann, NP        Past Medical History:  Diagnosis Date   Allergic rhinitis    Allergy    Anemia    Anxiety    Aortic stenosis 09/13/2016   a.) TTE 09/13/2016: mild AS --> MPG 11.2 mmHg. b.) TTE 12/27/2020: EF 55-60%; no AS --> MPG 8 mmHg.   Aortic valve endocarditis 12/25/2018   a.) TTE 12/25/2018: 0.75 x 1 cm AV mass. b.) TEE 12/28/2018: small shaggy mobile density on aortic side of RIGHT coronary cusp  consistent with vegetation.   Arthritis    Back pain    Bradycardia    Breast mass    Patient can no longer palpate specific masses but showed tech general area of concern   Charcot's joint of ankle, left    CHF (congestive heart failure) ([place]) 07/09/2007   a.) TTE 07/09/2007: EF 50%; G1DD. b.) TTE 09/13/2016: EF 55%; G2DD. c.) TTE 12/27/2020: EF 55-60%; G2DD; GLS -15.1%.   CKD (chronic kidney disease), stage III (HCC)    Complication of anesthesia    a.) PONV. b.) Delayed emergence   Constipation    Diabetic nephropathy ([place])    Dyspnea    Family history of adverse reaction to anesthesia    a.) Sisters x 2 with (+) delayed emergence.   GERD (gastroesophageal reflux disease)    Heart murmur    HLD (hyperlipidemia)    Hyperparathyroidism ([place])    Hypertension    Joint pain    Leg edema    Legally blind in left eye, as defined in [place]    Lymphedema    Mild pulmonary hypertension ([place]) 09/13/2016   a.) TTE 09/13/2016: EF 55%; RVSP 45 mmHg. b.) TTE 12/25/2018: EF 55-60%; RVSP 35.5 mmHg.  Obesity    Onychomycosis    PONV (postoperative nausea and vomiting)    S/P BKA (below knee amputation) unilateral, left ([place])    Sepsis ([place]) 12/2018   a.) group G streptococcal bacteremia with AV endocarditis and LLE soft tissue infection.   T2DM (type 2 diabetes mellitus) ([place])     Past Surgical History:  Procedure Laterality Date   ABDOMINAL HYSTERECTOMY     AMPUTATION Left 05/05/2019   Procedure: AMPUTATION BELOW KNEE;  Surgeon: Katha Cabal, MD;  Location: [facility] ORS;  Service: Vascular;  Laterality: Left;   AV FISTULA PLACEMENT Right 09/19/2021   Procedure: ARTERIOVENOUS (AV) FISTULA CREATION (BRACHIAL CEPHALIC);  Surgeon: Katha Cabal, MD;  Location: [facility] ORS;  Service: Vascular;  Laterality: Right;   AV FISTULA PLACEMENT Left 11/09/2021   Procedure: ARTERIOVENOUS (AV) FISTULA CREATION ( BRACHIAL CEPHALIC );  Surgeon: Katha Cabal, MD;  Location: [facility] ORS;  Service:  Vascular;  Laterality: Left;   BREAST BIOPSY Left 2014   FNA 12:00 position - Negative   EYE SURGERY Left 2007   removed a lens, no lens implanted   IR FLUORO GUIDE CV LINE RIGHT  12/29/2018   IR REMOVAL TUN CV CATH W/O FL  02/19/2019   IR US GUIDE VASC ACCESS RIGHT  12/29/2018   TEE WITHOUT CARDIOVERSION N/A 12/28/2018   Procedure: TRANSESOPHAGEAL ECHOCARDIOGRAM (TEE);  Surgeon: Sueanne Margarita, MD;  Location: [facility] ENDOSCOPY;  Service: Cardiovascular;  Laterality: N/A;     Social History   Tobacco Use   Smoking status: Never   Smokeless tobacco: Never  Vaping Use   Vaping Use: Never used  Substance Use Topics   Alcohol use: No   Drug use: Never     Family History  Problem Relation Age of Onset   Breast cancer Sister 52   Diabetes Sister    Diabetes Mother    Hypertension Mother    Hyperlipidemia Mother    Eating disorder Mother    Obesity Mother     Allergies  Allergen Reactions   Nifedipine Rash   Penicillins Hives, Shortness Of Breath and Swelling   Statins Shortness Of Breath    Wheezing   Hydralazine Itching   Hydrocodone Itching and Nausea Only   Oxycodone Nausea Only   Codeine Nausea Only   Ibuprofen Other (See Comments)    Raises blood pressure     REVIEW OF SYSTEMS (Negative unless checked)  Constitutional: [] Weight loss  [] Fever  [] Chills Cardiac: [] Chest pain   [] Chest pressure   [] Palpitations   [] Shortness of breath when laying flat   [] Shortness of breath at rest   [] Shortness of breath with exertion. Vascular:  [] Pain in legs with walking   [] Pain in legs at rest   [] Pain in legs when laying flat   [] Claudication   [] Pain in feet when walking  [] Pain in feet at rest  [] Pain in feet when laying flat   [] History of DVT   [] Phlebitis   [] Swelling in legs   [] Varicose veins   [] Non-healing ulcers Pulmonary:   [] Uses home oxygen   [] Productive cough   [] Hemoptysis   [] Wheeze  [] COPD   [] Asthma Neurologic:  [] Dizziness  [] Blackouts   [] Seizures    [] History of stroke   [] History of TIA  [] Aphasia   [] Temporary blindness   [] Dysphagia   [] Weakness or numbness in arms   [] Weakness or numbness in legs Musculoskeletal:  [x] Arthritis   [] Joint swelling   [] Joint pain   [] Low back  pain Hematologic:  [] Easy bruising  [] Easy bleeding   [] Hypercoagulable state   [x] Anemic  [] Hepatitis Gastrointestinal:  [] Blood in stool   [] Vomiting blood  [] Gastroesophageal reflux/heartburn   [] Difficulty swallowing. Genitourinary:  [x] Chronic kidney disease   [] Difficult urination  [] Frequent urination  [] Burning with urination   [] Blood in urine Skin:  [] Rashes   [] Ulcers   [] Wounds Psychological:  [] History of anxiety   []  History of major depression.  Physical Examination  There were no vitals filed for this visit. There is no height or weight on file to calculate BMI. Gen: WD/WN, NAD.  Morbidly obese Head: [place]/AT, No temporalis wasting.  Ear/Nose/Throat: Hearing grossly intact, nares w/o erythema or drainage, oropharynx w/o Erythema/Exudate,  Eyes: Conjunctiva clear, sclera non-icteric Neck: Trachea midline.  No JVD.  Pulmonary:  Good air movement, respirations not labored, no use of accessory muscles.  Cardiac: RRR, normal S1, S2. Vascular:  Vessel Right Left  Radial Palpable Palpable           Musculoskeletal: M/S 5/5 throughout.  Extremities without ischemic changes.  No deformity or atrophy.  Neurologic: Sensation grossly intact in extremities.  Symmetrical.  Speech is fluent. Motor exam as listed above. Psychiatric: Judgment intact, Mood & affect appropriate for pt's clinical situation. Dermatologic: No rashes or ulcers noted.  No cellulitis or open wounds.      CBC Lab Results  Component Value Date   WBC 6.9 10/31/2021   HGB 10.2 (L) 11/09/2021   HCT 30.0 (L) 11/09/2021   MCV 91.1 10/31/2021   PLT 219 10/31/2021    BMET    Component Value Date/Time   NA 134 (L) 11/09/2021 0648   NA 139 06/25/2018 1050   NA 137 11/21/2013 0627    K 4.3 11/09/2021 0648   K 4.6 11/21/2013 0627   CL 98 11/09/2021 0648   CL 107 11/21/2013 0627   CO2 25 10/31/2021 1351   CO2 24 11/21/2013 0627   GLUCOSE 237 (H) 11/09/2021 0648   GLUCOSE 95 11/21/2013 0627   BUN 76 (H) 11/09/2021 0648   BUN 43 (H) 06/25/2018 1050   BUN 34 (H) 11/21/2013 0627   CREATININE 4.60 (H) 11/09/2021 0648   CREATININE 1.80 (H) 09/09/2014 1220   CALCIUM 8.7 (L) 10/31/2021 1351   CALCIUM 8.8 09/09/2014 1220   GFRNONAA 14 (L) 10/31/2021 1351   GFRNONAA 31 (L) 09/09/2014 1220   GFRNONAA 26 (L) 11/21/2013 0627   GFRAA 17 (L) 05/28/2019 2252   GFRAA 38 (L) 09/09/2014 1220   GFRAA 30 (L) 11/21/2013 0627   CrCl cannot be calculated (Patient's most recent lab result is older than the maximum 21 days allowed.).  COAG Lab Results  Component Value Date   INR 1.1 09/17/2021   INR 0.8 05/29/2019   INR 1.1 04/30/2019    Radiology DG PAIN CLINIC C-ARM 1-60 MIN NO REPORT  Result Date: 03/04/2022 Fluoro was used, but no Radiologist interpretation will be provided. Please refer to "NOTES" tab for provider progress note.    Assessment/Plan 1.  Chronic kidney disease now progressed to ESRD in need of PermCath placement for initiation of dialysis.  Fistulas have not matured to be usable. 2.  Diabetes.  An underlying cause of her renal failure and blood glucose control important in reducing the progression of atherosclerotic disease. Also, involved in wound healing. On appropriate medications. 3.  Hypertension. An underlying cause of her renal failure and blood pressure control important in reducing the progression of atherosclerotic disease. On appropriate oral medications.  Leotis Pain, MD  03/28/2022 8:16 AM

## 2022-04-15 ENCOUNTER — Telehealth: Payer: Medicare Other | Admitting: Student in an Organized Health Care Education/Training Program

## 2022-04-29 ENCOUNTER — Ambulatory Visit (INDEPENDENT_AMBULATORY_CARE_PROVIDER_SITE_OTHER): Payer: Medicare Other

## 2022-04-29 DIAGNOSIS — N185 Chronic kidney disease, stage 5: Secondary | ICD-10-CM | POA: Diagnosis not present

## 2022-06-07 ENCOUNTER — Ambulatory Visit (INDEPENDENT_AMBULATORY_CARE_PROVIDER_SITE_OTHER): Payer: Medicare Other | Admitting: Nurse Practitioner

## 2022-06-07 ENCOUNTER — Ambulatory Visit (INDEPENDENT_AMBULATORY_CARE_PROVIDER_SITE_OTHER): Payer: Medicare Other

## 2022-06-07 ENCOUNTER — Other Ambulatory Visit (INDEPENDENT_AMBULATORY_CARE_PROVIDER_SITE_OTHER): Payer: Self-pay | Admitting: Vascular Surgery

## 2022-06-07 DIAGNOSIS — E1151 Type 2 diabetes mellitus with diabetic peripheral angiopathy without gangrene: Secondary | ICD-10-CM | POA: Diagnosis not present

## 2022-06-07 DIAGNOSIS — I10 Essential (primary) hypertension: Secondary | ICD-10-CM

## 2022-06-07 DIAGNOSIS — N186 End stage renal disease: Secondary | ICD-10-CM

## 2022-06-07 DIAGNOSIS — Z992 Dependence on renal dialysis: Secondary | ICD-10-CM

## 2022-06-07 DIAGNOSIS — Z794 Long term (current) use of insulin: Secondary | ICD-10-CM

## 2022-06-08 ENCOUNTER — Encounter (INDEPENDENT_AMBULATORY_CARE_PROVIDER_SITE_OTHER): Payer: Self-pay | Admitting: Nurse Practitioner

## 2022-06-08 NOTE — Progress Notes (Signed)
Subjective:    Patient ID: Rachel Holmes, female    DOB: 1961/04/17, 61 y.o.   MRN: [id number] No chief complaint on file.   Rachel Holmes is a 61 year old female that presents today for evaluation of her AV fistula.  She has been experiencing issues with a during dialysis that she notes that she has been told that it may be too deep.  She is currently maintained via PermCath.  She denies any significant bleeding postdialysis.  She denies any fevers or chills.  Today noninvasive studies show flow volume of 1563.  There is some noted elevated velocities in the mid upper arm.    Review of Systems  Musculoskeletal:  Positive for gait problem.  All other systems reviewed and are negative.      Objective:   Physical Exam Vitals reviewed.  Constitutional:      Appearance: She is obese.  HENT:     Head: Normocephalic.  Cardiovascular:     Rate and Rhythm: Normal rate.  Pulmonary:     Effort: Pulmonary effort is normal.  Musculoskeletal:     Left Lower Extremity: Left leg is amputated below knee.  Skin:    General: Skin is warm and dry.  Neurological:     Mental Status: She is alert and oriented to person, place, and time.  Psychiatric:        Mood and Affect: Mood normal.        Behavior: Behavior normal.        Thought Content: Thought content normal.        Judgment: Judgment normal.     There were no vitals taken for this visit.  Past Medical History:  Diagnosis Date   Allergic rhinitis    Allergy    Anemia    Anxiety    Aortic stenosis 09/13/2016   a.) TTE 09/13/2016: mild AS --> MPG 11.2 mmHg. b.) TTE 12/27/2020: EF 55-60%; no AS --> MPG 8 mmHg.   Aortic valve endocarditis 12/25/2018   a.) TTE 12/25/2018: 0.75 x 1 cm AV mass. b.) TEE 12/28/2018: small shaggy mobile density on aortic side of RIGHT coronary cusp consistent with vegetation.   Arthritis    Back pain    Bradycardia    Breast mass    Patient can no longer palpate specific masses but showed tech  general area of concern   Charcot's joint of ankle, left    CHF (congestive heart failure) ([place]) 07/09/2007   a.) TTE 07/09/2007: EF 50%; G1DD. b.) TTE 09/13/2016: EF 55%; G2DD. c.) TTE 12/27/2020: EF 55-60%; G2DD; GLS -15.1%.   CKD (chronic kidney disease), stage III (HCC)    Complication of anesthesia    a.) PONV. b.) Delayed emergence   Constipation    Diabetic nephropathy ([place])    Dyspnea    Family history of adverse reaction to anesthesia    a.) Sisters x 2 with (+) delayed emergence.   GERD (gastroesophageal reflux disease)    Heart murmur    HLD (hyperlipidemia)    Hyperparathyroidism ([place])    Hypertension    Joint pain    Leg edema    Legally blind in left eye, as defined in [place]    Lymphedema    Mild pulmonary hypertension ([place]) 09/13/2016   a.) TTE 09/13/2016: EF 55%; RVSP 45 mmHg. b.) TTE 12/25/2018: EF 55-60%; RVSP 35.5 mmHg.   Obesity    Onychomycosis    PONV (postoperative nausea and vomiting)    S/P BKA (below knee  amputation) unilateral, left ([place])    Sepsis ([place]) 12/2018   a.) group G streptococcal bacteremia with AV endocarditis and LLE soft tissue infection.   T2DM (type 2 diabetes mellitus) ([place])     Social History   Socioeconomic History   Marital status: Divorced    Spouse name: Not on file   Number of children: 1   Years of education: Not on file   Highest education level: Not on file  Occupational History   Occupation: Glass blower/designer    Comment: disability  Tobacco Use   Smoking status: Never   Smokeless tobacco: Never  Vaping Use   Vaping Use: Never used  Substance and Sexual Activity   Alcohol use: No   Drug use: Never   Sexual activity: Not on file  Other Topics Concern   Not on file  Social History Narrative   ** Merged History Encounter **    on disability   Lives alone   Social Determinants of Health   Financial Resource Strain: Not on file  Food Insecurity: Not on file  Transportation Needs: Not on file  Physical  Activity: Not on file  Stress: Not on file  Social Connections: Not on file  Intimate Partner Violence: Not on file    Past Surgical History:  Procedure Laterality Date   ABDOMINAL HYSTERECTOMY     AMPUTATION Left 05/05/2019   Procedure: AMPUTATION BELOW KNEE;  Surgeon: Delana Meyer Dolores Lory, MD;  Location: [facility] ORS;  Service: Vascular;  Laterality: Left;   AV FISTULA PLACEMENT Right 09/19/2021   Procedure: ARTERIOVENOUS (AV) FISTULA CREATION (BRACHIAL CEPHALIC);  Surgeon: Katha Cabal, MD;  Location: [facility] ORS;  Service: Vascular;  Laterality: Right;   AV FISTULA PLACEMENT Left 11/09/2021   Procedure: ARTERIOVENOUS (AV) FISTULA CREATION ( BRACHIAL CEPHALIC );  Surgeon: Katha Cabal, MD;  Location: [facility] ORS;  Service: Vascular;  Laterality: Left;   BREAST BIOPSY Left 2014   FNA 12:00 position - Negative   DIALYSIS/PERMA CATHETER INSERTION N/A 03/28/2022   Procedure: DIALYSIS/PERMA CATHETER INSERTION;  Surgeon: Algernon Huxley, MD;  Location: [facility];  Service: Cardiovascular;  Laterality: N/A;   EYE SURGERY Left 2007   removed a lens, no lens implanted   IR FLUORO GUIDE CV LINE RIGHT  12/29/2018   IR REMOVAL TUN CV CATH W/O FL  02/19/2019   IR US GUIDE VASC ACCESS RIGHT  12/29/2018   TEE WITHOUT CARDIOVERSION N/A 12/28/2018   Procedure: TRANSESOPHAGEAL ECHOCARDIOGRAM (TEE);  Surgeon: Sueanne Margarita, MD;  Location[facility] ENDOSCOPY;  Service: Cardiovascular;  Laterality: N/A;    Family History  Problem Relation Age of Onset   Breast cancer Sister 43   Diabetes Sister    Diabetes Mother    Hypertension Mother    Hyperlipidemia Mother    Eating disorder Mother    Obesity Mother     Allergies  Allergen Reactions   Nifedipine Rash   Penicillins Hives, Shortness Of Breath and Swelling   Statins Shortness Of Breath    Wheezing   Hydralazine Itching   Hydrocodone Itching and Nausea Only   Oxycodone Nausea Only   Codeine Nausea Only   Ibuprofen Other (See Comments)     Raises blood pressure       Latest Ref Rng & Units 11/09/2021    6:48 AM 10/31/2021    1:51 PM 10/15/2021    1:57 PM  CBC  WBC 4.0 - 10.5 K/uL  6.9  7.7   Hemoglobin 12.0 -  15.0 g/dL 10.2  9.3  10.6   Hematocrit 36.0 - 46.0 % 30.0  28.7  33.8   Platelets 150 - 400 K/uL  219  192       CMP     Component Value Date/Time   NA 134 (L) 11/09/2021 0648   NA 139 06/25/2018 1050   NA 137 11/21/2013 0627   K 4.3 11/09/2021 0648   K 4.6 11/21/2013 0627   CL 98 11/09/2021 0648   CL 107 11/21/2013 0627   CO2 25 10/31/2021 1351   CO2 24 11/21/2013 0627   GLUCOSE 237 (H) 11/09/2021 0648   GLUCOSE 95 11/21/2013 0627   BUN 76 (H) 11/09/2021 0648   BUN 43 (H) 06/25/2018 1050   BUN 34 (H) 11/21/2013 0627   CREATININE 4.60 (H) 11/09/2021 0648   CREATININE 1.80 (H) 09/09/2014 1220   CALCIUM 8.7 (L) 10/31/2021 1351   CALCIUM 8.8 09/09/2014 1220   PROT 8.2 (H) 10/15/2021 1357   PROT 6.4 06/25/2018 1050   PROT 7.9 11/19/2013 0934   ALBUMIN 4.1 10/15/2021 1357   ALBUMIN 3.5 06/25/2018 1050   ALBUMIN 3.2 (L) 11/19/2013 0934   AST 23 10/15/2021 1357   AST 19 11/19/2013 0934   ALT 16 10/15/2021 1357   ALT 20 11/19/2013 0934   ALKPHOS 112 10/15/2021 1357   ALKPHOS 95 11/19/2013 0934   BILITOT 0.5 10/15/2021 1357   BILITOT 0.3 06/25/2018 1050   BILITOT 0.3 11/19/2013 0934   GFRNONAA 14 (L) 10/31/2021 1351   GFRNONAA 31 (L) 09/09/2014 1220   GFRNONAA 26 (L) 11/21/2013 0627   GFRAA 17 (L) 05/28/2019 2252   GFRAA 38 (L) 09/09/2014 1220   GFRAA 30 (L) 11/21/2013 0627     No results found.     Assessment & Plan:   1. ESRD on dialysis [facility]) On evaluation today the patient has a pulsatile thrill and there is evidence of elevated velocities in the left upper arm AV fistula in the mid to distal segment.  Based on this the fistula may not be readily palpable due to the stenosis.  We will have the patient undergo fistulogram to see if this helps to improved the quality of her dialysis.   If not the patient will likely need to undergo superficialization.  We discussed the fistulogram including the procedure, benefits and alternatives.  We also discussed proceeding directly to a superficialization.  Following our discussion she agrees to proceed with fistulogram.  We will have the patient follow-up in office after.  2. Essential hypertension Continue antihypertensive medications as already ordered, these medications have been reviewed and there are no changes at this time.   3. Type 2 diabetes mellitus with diabetic peripheral angiopathy without gangrene, with long-term current use of insulin ([place]) Continue hypoglycemic medications as already ordered, these medications have been reviewed and there are no changes at this time.  Hgb A1C to be monitored as already arranged by primary service    Current Outpatient Medications on File Prior to Visit  Medication Sig Dispense Refill   acetaminophen (TYLENOL) 500 MG tablet Take 1,000 mg by mouth every 6 (six) hours as needed.     albuterol (PROVENTIL HFA;VENTOLIN HFA) 108 (90 Base) MCG/ACT inhaler Inhale 1-2 puffs into the lungs every 6 (six) hours as needed for wheezing or shortness of breath.     aspirin EC 81 MG EC tablet Take 1 tablet (81 mg total) by mouth daily.     Blood Glucose Monitoring Suppl (ONE TOUCH ULTRA  2) w/Device KIT 1 Device by Does not apply route daily. 1 each 0   calcitRIOL (ROCALTROL) 0.25 MCG capsule Take 0.25 mcg by mouth daily.     cetirizine (ZYRTEC) 10 MG tablet Take 10 mg by mouth at bedtime.     clobetasol cream (TEMOVATE) 8.88 % Apply 1 application topically 2 (two) times daily as needed (rash).     cloNIDine (CATAPRES) 0.3 MG tablet Take 1 tablet (0.3 mg total) by mouth 2 (two) times daily. 180 tablet 3   Continuous Blood Gluc Receiver (FREESTYLE LIBRE 14 DAY READER) DEVI USE AS DIRECTED 1 each 0   Continuous Blood Gluc Sensor (FREESTYLE LIBRE 14 DAY SENSOR) MISC USE DEVICE FOR 14 DAYS AS DIRECTED 6 each 0    docusate sodium (COLACE) 100 MG capsule Take 1 capsule (100 mg total) by mouth 2 (two) times daily. 10 capsule 0   doxazosin (CARDURA) 4 MG tablet Take 1 tablet (4 mg total) by mouth at bedtime. 90 tablet 3   DULoxetine (CYMBALTA) 30 MG capsule Take 30 mg by mouth at bedtime.     ezetimibe (ZETIA) 10 MG tablet Take 1 tablet (10 mg total) by mouth daily. (Patient taking differently: Take 10 mg by mouth at bedtime.) 90 tablet 3   gabapentin (NEURONTIN) 600 MG tablet Take 300 mg by mouth at bedtime.     gabapentin (NEURONTIN) 600 MG tablet Take 300 mg by mouth daily.     insulin lispro (HUMALOG KWIKPEN) 100 UNIT/ML KwikPen 3 times a day (just before each meal) 16-18-19 units, and pen needles 3 day. (Patient taking differently: Inject 25 Units into the skin 3 (three) times daily with meals.)     Insulin Pen Needle (BD PEN NEEDLE MICRO U/F) 32G X 6 MM MISC 1 each by Does not apply route 3 (three) times daily. 100 each 3   isosorbide mononitrate (IMDUR) 120 MG 24 hr tablet Take 1 tablet (120 mg total) by mouth daily. (Patient taking differently: Take 120 mg by mouth in the morning and at bedtime.) 180 tablet 3   Lancets (ONETOUCH ULTRASOFT) lancets Used to check blood sugars four times daily. 200 each 12   methocarbamol (ROBAXIN) 750 MG tablet Take 1 tablet (750 mg total) by mouth every 12 (twelve) hours as needed for muscle spasms. 60 tablet 2   Multiple Vitamin (MULTIVITAMIN WITH MINERALS) TABS tablet Take 1 tablet by mouth daily.     olmesartan (BENICAR) 40 MG tablet Take 40 mg by mouth at bedtime.     omeprazole (PRILOSEC) 40 MG capsule Take 40 mg by mouth 2 (two) times daily with breakfast and lunch.     ondansetron (ZOFRAN ODT) 4 MG disintegrating tablet Take 1 tablet (4 mg total) by mouth every 8 (eight) hours as needed for nausea or vomiting. 20 tablet 0   ONETOUCH VERIO test strip USE TO CHECK BLOOD SUGAR TWICE DAILY 100 each 0   silver sulfADIAZINE (SILVADENE) 1 % cream Apply 1 application  topically daily. 50 g 0   torsemide (DEMADEX) 100 MG tablet Take 100 mg by mouth every morning.     TRULICITY 4.5 LN/7.9JK SOPN Inhale 4.5 mg into the lungs every 7 (seven) days.     vitamin B-12 (CYANOCOBALAMIN) 1000 MCG tablet Take 1,000 mcg by mouth daily.     traMADol (ULTRAM) 50 MG tablet Take 1 tablet (50 mg total) by mouth every 6 (six) hours as needed for moderate pain or severe pain. (Patient not taking: Reported on 06/07/2022) 36 tablet  0   No current facility-administered medications on file prior to visit.    There are no Patient Instructions on file for this visit. No follow-ups on file.   Kris Hartmann, NP

## 2022-06-08 NOTE — H&P (View-Only) (Signed)
Subjective:    Patient ID: Rachel Holmes, female    DOB: 01-02-1961, 61 y.o.   MRN: [id number] No chief complaint on file.   [name] is a 61 year old female that presents today for evaluation of her AV fistula.  She has been experiencing issues with a during dialysis that she notes that she has been told that it may be too deep.  She is currently maintained via PermCath.  She denies any significant bleeding postdialysis.  She denies any fevers or chills.  Today noninvasive studies show flow volume of 1563.  There is some noted elevated velocities in the mid upper arm.    Review of Systems  Musculoskeletal:  Positive for gait problem.  All other systems reviewed and are negative.      Objective:   Physical Exam Vitals reviewed.  Constitutional:      Appearance: She is obese.  HENT:     Head: Normocephalic.  Cardiovascular:     Rate and Rhythm: Normal rate.  Pulmonary:     Effort: Pulmonary effort is normal.  Musculoskeletal:     Left Lower Extremity: Left leg is amputated below knee.  Skin:    General: Skin is warm and dry.  Neurological:     Mental Status: She is alert and oriented to person, place, and time.  Psychiatric:        Mood and Affect: Mood normal.        Behavior: Behavior normal.        Thought Content: Thought content normal.        Judgment: Judgment normal.     There were no vitals taken for this visit.  Past Medical History:  Diagnosis Date   Allergic rhinitis    Allergy    Anemia    Anxiety    Aortic stenosis 09/13/2016   a.) TTE 09/13/2016: mild AS --> MPG 11.2 mmHg. b.) TTE 12/27/2020: EF 55-60%; no AS --> MPG 8 mmHg.   Aortic valve endocarditis 12/25/2018   a.) TTE 12/25/2018: 0.75 x 1 cm AV mass. b.) TEE 12/28/2018: small shaggy mobile density on aortic side of RIGHT coronary cusp consistent with vegetation.   Arthritis    Back pain    Bradycardia    Breast mass    Patient can no longer palpate specific masses but showed tech  general area of concern   Charcot's joint of ankle, left    CHF (congestive heart failure) ([place]) 07/09/2007   a.) TTE 07/09/2007: EF 50%; G1DD. b.) TTE 09/13/2016: EF 55%; G2DD. c.) TTE 12/27/2020: EF 55-60%; G2DD; GLS -15.1%.   CKD (chronic kidney disease), stage III (HCC)    Complication of anesthesia    a.) PONV. b.) Delayed emergence   Constipation    Diabetic nephropathy ([place])    Dyspnea    Family history of adverse reaction to anesthesia    a.) Sisters x 2 with (+) delayed emergence.   GERD (gastroesophageal reflux disease)    Heart murmur    HLD (hyperlipidemia)    Hyperparathyroidism ([place])    Hypertension    Joint pain    Leg edema    Legally blind in left eye, as defined in [place]    Lymphedema    Mild pulmonary hypertension ([place]) 09/13/2016   a.) TTE 09/13/2016: EF 55%; RVSP 45 mmHg. b.) TTE 12/25/2018: EF 55-60%; RVSP 35.5 mmHg.   Obesity    Onychomycosis    PONV (postoperative nausea and vomiting)    S/P BKA (below knee  amputation) unilateral, left ([place])    Sepsis ([place]) 12/2018   a.) group G streptococcal bacteremia with AV endocarditis and LLE soft tissue infection.   T2DM (type 2 diabetes mellitus) ([place])     Social History   Socioeconomic History   Marital status: Divorced    Spouse name: Not on file   Number of children: 1   Years of education: Not on file   Highest education level: Not on file  Occupational History   Occupation: Glass blower/designer    Comment: disability  Tobacco Use   Smoking status: Never   Smokeless tobacco: Never  Vaping Use   Vaping Use: Never used  Substance and Sexual Activity   Alcohol use: No   Drug use: Never   Sexual activity: Not on file  Other Topics Concern   Not on file  Social History Narrative   ** Merged History Encounter **    on disability   Lives alone   Social Determinants of Health   Financial Resource Strain: Not on file  Food Insecurity: Not on file  Transportation Needs: Not on file  Physical  Activity: Not on file  Stress: Not on file  Social Connections: Not on file  Intimate Partner Violence: Not on file    Past Surgical History:  Procedure Laterality Date   ABDOMINAL HYSTERECTOMY     AMPUTATION Left 05/05/2019   Procedure: AMPUTATION BELOW KNEE;  Surgeon: Delana Meyer Dolores Lory, MD;  Location: [facility] ORS;  Service: Vascular;  Laterality: Left;   AV FISTULA PLACEMENT Right 09/19/2021   Procedure: ARTERIOVENOUS (AV) FISTULA CREATION (BRACHIAL CEPHALIC);  Surgeon: Katha Cabal, MD;  Location: [facility] ORS;  Service: Vascular;  Laterality: Right;   AV FISTULA PLACEMENT Left 11/09/2021   Procedure: ARTERIOVENOUS (AV) FISTULA CREATION ( BRACHIAL CEPHALIC );  Surgeon: Katha Cabal, MD;  Location: [facility] ORS;  Service: Vascular;  Laterality: Left;   BREAST BIOPSY Left 2014   FNA 12:00 position - Negative   DIALYSIS/PERMA CATHETER INSERTION N/A 03/28/2022   Procedure: DIALYSIS/PERMA CATHETER INSERTION;  Surgeon: Algernon Huxley, MD;  Location: [facility];  Service: Cardiovascular;  Laterality: N/A;   EYE SURGERY Left 2007   removed a lens, no lens implanted   IR FLUORO GUIDE CV LINE RIGHT  12/29/2018   IR REMOVAL TUN CV CATH W/O FL  02/19/2019   IR US GUIDE VASC ACCESS RIGHT  12/29/2018   TEE WITHOUT CARDIOVERSION N/A 12/28/2018   Procedure: TRANSESOPHAGEAL ECHOCARDIOGRAM (TEE);  Surgeon: Sueanne Margarita, MD;  Location: [facility] ENDOSCOPY;  Service: Cardiovascular;  Laterality: N/A;    Family History  Problem Relation Age of Onset   Breast cancer Sister 57   Diabetes Sister    Diabetes Mother    Hypertension Mother    Hyperlipidemia Mother    Eating disorder Mother    Obesity Mother     Allergies  Allergen Reactions   Nifedipine Rash   Penicillins Hives, Shortness Of Breath and Swelling   Statins Shortness Of Breath    Wheezing   Hydralazine Itching   Hydrocodone Itching and Nausea Only   Oxycodone Nausea Only   Codeine Nausea Only   Ibuprofen Other (See Comments)     Raises blood pressure       Latest Ref Rng & Units 11/09/2021    6:48 AM 10/31/2021    1:51 PM 10/15/2021    1:57 PM  CBC  WBC 4.0 - 10.5 K/uL  6.9  7.7   Hemoglobin 12.0 -  15.0 g/dL 10.2  9.3  10.6   Hematocrit 36.0 - 46.0 % 30.0  28.7  33.8   Platelets 150 - 400 K/uL  219  192       CMP     Component Value Date/Time   NA 134 (L) 11/09/2021 0648   NA 139 06/25/2018 1050   NA 137 11/21/2013 0627   K 4.3 11/09/2021 0648   K 4.6 11/21/2013 0627   CL 98 11/09/2021 0648   CL 107 11/21/2013 0627   CO2 25 10/31/2021 1351   CO2 24 11/21/2013 0627   GLUCOSE 237 (H) 11/09/2021 0648   GLUCOSE 95 11/21/2013 0627   BUN 76 (H) 11/09/2021 0648   BUN 43 (H) 06/25/2018 1050   BUN 34 (H) 11/21/2013 0627   CREATININE 4.60 (H) 11/09/2021 0648   CREATININE 1.80 (H) 09/09/2014 1220   CALCIUM 8.7 (L) 10/31/2021 1351   CALCIUM 8.8 09/09/2014 1220   PROT 8.2 (H) 10/15/2021 1357   PROT 6.4 06/25/2018 1050   PROT 7.9 11/19/2013 0934   ALBUMIN 4.1 10/15/2021 1357   ALBUMIN 3.5 06/25/2018 1050   ALBUMIN 3.2 (L) 11/19/2013 0934   AST 23 10/15/2021 1357   AST 19 11/19/2013 0934   ALT 16 10/15/2021 1357   ALT 20 11/19/2013 0934   ALKPHOS 112 10/15/2021 1357   ALKPHOS 95 11/19/2013 0934   BILITOT 0.5 10/15/2021 1357   BILITOT 0.3 06/25/2018 1050   BILITOT 0.3 11/19/2013 0934   GFRNONAA 14 (L) 10/31/2021 1351   GFRNONAA 31 (L) 09/09/2014 1220   GFRNONAA 26 (L) 11/21/2013 0627   GFRAA 17 (L) 05/28/2019 2252   GFRAA 38 (L) 09/09/2014 1220   GFRAA 30 (L) 11/21/2013 0627     No results found.     Assessment & Plan:   1. ESRD on dialysis [facility]) On evaluation today the patient has a pulsatile thrill and there is evidence of elevated velocities in the left upper arm AV fistula in the mid to distal segment.  Based on this the fistula may not be readily palpable due to the stenosis.  We will have the patient undergo fistulogram to see if this helps to improved the quality of her dialysis.   If not the patient will likely need to undergo superficialization.  We discussed the fistulogram including the procedure, benefits and alternatives.  We also discussed proceeding directly to a superficialization.  Following our discussion she agrees to proceed with fistulogram.  We will have the patient follow-up in office after.  2. Essential hypertension Continue antihypertensive medications as already ordered, these medications have been reviewed and there are no changes at this time.   3. Type 2 diabetes mellitus with diabetic peripheral angiopathy without gangrene, with long-term current use of insulin ([place]) Continue hypoglycemic medications as already ordered, these medications have been reviewed and there are no changes at this time.  Hgb A1C to be monitored as already arranged by primary service    Current Outpatient Medications on File Prior to Visit  Medication Sig Dispense Refill   acetaminophen (TYLENOL) 500 MG tablet Take 1,000 mg by mouth every 6 (six) hours as needed.     albuterol (PROVENTIL HFA;VENTOLIN HFA) 108 (90 Base) MCG/ACT inhaler Inhale 1-2 puffs into the lungs every 6 (six) hours as needed for wheezing or shortness of breath.     aspirin EC 81 MG EC tablet Take 1 tablet (81 mg total) by mouth daily.     Blood Glucose Monitoring Suppl (ONE TOUCH ULTRA  2) w/Device KIT 1 Device by Does not apply route daily. 1 each 0   calcitRIOL (ROCALTROL) 0.25 MCG capsule Take 0.25 mcg by mouth daily.     cetirizine (ZYRTEC) 10 MG tablet Take 10 mg by mouth at bedtime.     clobetasol cream (TEMOVATE) 4.92 % Apply 1 application topically 2 (two) times daily as needed (rash).     cloNIDine (CATAPRES) 0.3 MG tablet Take 1 tablet (0.3 mg total) by mouth 2 (two) times daily. 180 tablet 3   Continuous Blood Gluc Receiver (FREESTYLE LIBRE 14 DAY READER) DEVI USE AS DIRECTED 1 each 0   Continuous Blood Gluc Sensor (FREESTYLE LIBRE 14 DAY SENSOR) MISC USE DEVICE FOR 14 DAYS AS DIRECTED 6 each 0    docusate sodium (COLACE) 100 MG capsule Take 1 capsule (100 mg total) by mouth 2 (two) times daily. 10 capsule 0   doxazosin (CARDURA) 4 MG tablet Take 1 tablet (4 mg total) by mouth at bedtime. 90 tablet 3   DULoxetine (CYMBALTA) 30 MG capsule Take 30 mg by mouth at bedtime.     ezetimibe (ZETIA) 10 MG tablet Take 1 tablet (10 mg total) by mouth daily. (Patient taking differently: Take 10 mg by mouth at bedtime.) 90 tablet 3   gabapentin (NEURONTIN) 600 MG tablet Take 300 mg by mouth at bedtime.     gabapentin (NEURONTIN) 600 MG tablet Take 300 mg by mouth daily.     insulin lispro (HUMALOG KWIKPEN) 100 UNIT/ML KwikPen 3 times a day (just before each meal) 16-18-19 units, and pen needles 3 day. (Patient taking differently: Inject 25 Units into the skin 3 (three) times daily with meals.)     Insulin Pen Needle (BD PEN NEEDLE MICRO U/F) 32G X 6 MM MISC 1 each by Does not apply route 3 (three) times daily. 100 each 3   isosorbide mononitrate (IMDUR) 120 MG 24 hr tablet Take 1 tablet (120 mg total) by mouth daily. (Patient taking differently: Take 120 mg by mouth in the morning and at bedtime.) 180 tablet 3   Lancets (ONETOUCH ULTRASOFT) lancets Used to check blood sugars four times daily. 200 each 12   methocarbamol (ROBAXIN) 750 MG tablet Take 1 tablet (750 mg total) by mouth every 12 (twelve) hours as needed for muscle spasms. 60 tablet 2   Multiple Vitamin (MULTIVITAMIN WITH MINERALS) TABS tablet Take 1 tablet by mouth daily.     olmesartan (BENICAR) 40 MG tablet Take 40 mg by mouth at bedtime.     omeprazole (PRILOSEC) 40 MG capsule Take 40 mg by mouth 2 (two) times daily with breakfast and lunch.     ondansetron (ZOFRAN ODT) 4 MG disintegrating tablet Take 1 tablet (4 mg total) by mouth every 8 (eight) hours as needed for nausea or vomiting. 20 tablet 0   ONETOUCH VERIO test strip USE TO CHECK BLOOD SUGAR TWICE DAILY 100 each 0   silver sulfADIAZINE (SILVADENE) 1 % cream Apply 1 application  topically daily. 50 g 0   torsemide (DEMADEX) 100 MG tablet Take 100 mg by mouth every morning.     TRULICITY 4.5 EF/0.0FH SOPN Inhale 4.5 mg into the lungs every 7 (seven) days.     vitamin B-12 (CYANOCOBALAMIN) 1000 MCG tablet Take 1,000 mcg by mouth daily.     traMADol (ULTRAM) 50 MG tablet Take 1 tablet (50 mg total) by mouth every 6 (six) hours as needed for moderate pain or severe pain. (Patient not taking: Reported on 06/07/2022) 36 tablet  0   No current facility-administered medications on file prior to visit.    There are no Patient Instructions on file for this visit. No follow-ups on file.   Kris Hartmann, NP

## 2022-06-10 ENCOUNTER — Telehealth (INDEPENDENT_AMBULATORY_CARE_PROVIDER_SITE_OTHER): Payer: Self-pay

## 2022-06-10 NOTE — Telephone Encounter (Signed)
Spoke with the patient and she is scheduled with Dr. Delana Meyer for a left arm fistulagram on 06/18/22 with a 1:00 pm arrival time to the MM. Pre-procedure instructions were discussed and will be mailed.

## 2022-06-18 ENCOUNTER — Encounter
Admission: RE | Disposition: A | Discharge status: Home / Self Care | Payer: Self-pay | Source: Home / Self Care | Attending: Vascular Surgery

## 2022-06-18 ENCOUNTER — Encounter: Payer: Self-pay | Admitting: Vascular Surgery

## 2022-06-18 ENCOUNTER — Other Ambulatory Visit: Payer: Self-pay

## 2022-06-18 ENCOUNTER — Ambulatory Visit
Admission: RE | Admit: 2022-06-18 | Discharge: 2022-06-18 | Disposition: A | Discharge status: Home / Self Care | Payer: Medicare Other | Attending: Vascular Surgery | Admitting: Vascular Surgery

## 2022-06-18 DIAGNOSIS — T82858A Stenosis of vascular prosthetic devices, implants and grafts, initial encounter: Secondary | ICD-10-CM | POA: Insufficient documentation

## 2022-06-18 DIAGNOSIS — E1122 Type 2 diabetes mellitus with diabetic chronic kidney disease: Secondary | ICD-10-CM | POA: Insufficient documentation

## 2022-06-18 DIAGNOSIS — E1151 Type 2 diabetes mellitus with diabetic peripheral angiopathy without gangrene: Secondary | ICD-10-CM | POA: Insufficient documentation

## 2022-06-18 DIAGNOSIS — I120 Hypertensive chronic kidney disease with stage 5 chronic kidney disease or end stage renal disease: Secondary | ICD-10-CM | POA: Insufficient documentation

## 2022-06-18 DIAGNOSIS — Y841 Kidney dialysis as the cause of abnormal reaction of the patient, or of later complication, without mention of misadventure at the time of the procedure: Secondary | ICD-10-CM | POA: Diagnosis not present

## 2022-06-18 DIAGNOSIS — Z992 Dependence on renal dialysis: Secondary | ICD-10-CM | POA: Diagnosis not present

## 2022-06-18 DIAGNOSIS — Z7985 Long-term (current) use of injectable non-insulin antidiabetic drugs: Secondary | ICD-10-CM | POA: Insufficient documentation

## 2022-06-18 DIAGNOSIS — N186 End stage renal disease: Secondary | ICD-10-CM | POA: Insufficient documentation

## 2022-06-18 DIAGNOSIS — Z794 Long term (current) use of insulin: Secondary | ICD-10-CM | POA: Diagnosis not present

## 2022-06-18 HISTORY — PX: A/V FISTULAGRAM: CATH118298

## 2022-06-18 LAB — GLUCOSE, CAPILLARY
Glucose-Capillary: 236 mg/dL — ABNORMAL HIGH (ref 70–99)
Glucose-Capillary: 196 mg/dL — ABNORMAL HIGH (ref 70–99)

## 2022-06-18 LAB — POTASSIUM (ARMC VASCULAR LAB ONLY): Potassium (ARMC vascular lab): 4.5 mmol/L (ref 3.5–5.1)

## 2022-06-18 SURGERY — A/V FISTULAGRAM
Anesthesia: Moderate Sedation | Laterality: Left

## 2022-06-18 MED ORDER — HYDROMORPHONE HCL 1 MG/ML IJ SOLN: 1.0000 mg | Freq: Once | INTRAMUSCULAR | Status: DC | PRN

## 2022-06-18 MED ORDER — METHYLPREDNISOLONE SODIUM SUCC 125 MG IJ SOLR
125.0000 mg | Freq: Once | INTRAMUSCULAR | Status: DC | PRN
Start: 2022-06-18 — End: 2022-06-18

## 2022-06-18 MED ORDER — FENTANYL CITRATE PF 50 MCG/ML IJ SOSY
PREFILLED_SYRINGE | INTRAMUSCULAR | Status: AC
  Filled 2022-06-18: qty 1

## 2022-06-18 MED ORDER — VANCOMYCIN HCL 1500 MG/300ML IV SOLN
1500.0000 mg | INTRAVENOUS | Status: AC
  Administered 2022-06-18: 1500 mg via INTRAVENOUS
  Filled 2022-06-18: qty 300

## 2022-06-18 MED ORDER — HEPARIN SODIUM (PORCINE) 1000 UNIT/ML IJ SOLN
INTRAMUSCULAR | Status: DC | PRN
  Administered 2022-06-18: 4000 [IU] via INTRAVENOUS

## 2022-06-18 MED ORDER — ONDANSETRON HCL 4 MG/2ML IJ SOLN
4.0000 mg | Freq: Four times a day (QID) | INTRAMUSCULAR | Status: DC | PRN
Start: 2022-06-18 — End: 2022-06-18

## 2022-06-18 MED ORDER — SODIUM CHLORIDE 0.9 % IV SOLN: INTRAVENOUS | Status: DC

## 2022-06-18 MED ORDER — VANCOMYCIN HCL IN DEXTROSE 1-5 GM/200ML-% IV SOLN
1000.0000 mg | INTRAVENOUS | Status: DC
  Filled 2022-06-18: qty 200

## 2022-06-18 MED ORDER — FAMOTIDINE 20 MG PO TABS
40.0000 mg | ORAL_TABLET | Freq: Once | ORAL | Status: DC | PRN
Start: 2022-06-18 — End: 2022-06-18

## 2022-06-18 MED ORDER — MIDAZOLAM HCL 2 MG/ML PO SYRP: 8.0000 mg | ORAL_SOLUTION | Freq: Once | ORAL | Status: DC | PRN

## 2022-06-18 MED ORDER — MIDAZOLAM HCL 2 MG/2ML IJ SOLN
INTRAMUSCULAR | Status: AC
  Filled 2022-06-18: qty 2

## 2022-06-18 MED ORDER — IODIXANOL 320 MG/ML IV SOLN
INTRAVENOUS | Status: DC | PRN
  Administered 2022-06-18: 30 mL

## 2022-06-18 MED ORDER — DIPHENHYDRAMINE HCL 50 MG/ML IJ SOLN: 50.0000 mg | Freq: Once | INTRAMUSCULAR | Status: DC | PRN

## 2022-06-18 MED ORDER — HEPARIN SODIUM (PORCINE) 1000 UNIT/ML IJ SOLN
INTRAMUSCULAR | Status: AC
  Filled 2022-06-18: qty 10

## 2022-06-18 MED ORDER — MIDAZOLAM HCL 2 MG/2ML IJ SOLN
INTRAMUSCULAR | Status: DC | PRN
  Administered 2022-06-18: 2 mg via INTRAVENOUS

## 2022-06-18 MED ORDER — FENTANYL CITRATE (PF) 100 MCG/2ML IJ SOLN
INTRAMUSCULAR | Status: DC | PRN
  Administered 2022-06-18: 50 ug via INTRAVENOUS

## 2022-06-18 SURGICAL SUPPLY — 17 items
BALLN DORADO 7X40X80 (BALLOONS) ×2
BALLN LUTONIX DCB 4X60X130 (BALLOONS) ×2
BALLN LUTONIX DCB 6X40X130 (BALLOONS) ×2
BALLOON DORADO 7X40X80 (BALLOONS) IMPLANT
BALLOON LUTONIX DCB 4X60X130 (BALLOONS) IMPLANT
BALLOON LUTONIX DCB 6X40X130 (BALLOONS) IMPLANT
CATH BEACON 5 .035 65 KMP TIP (CATHETERS) ×1 IMPLANT
DEVICE TORQUE .025-.038 (MISCELLANEOUS) ×1 IMPLANT
DRAPE BRACHIAL (DRAPES) ×1 IMPLANT
GUIDEWIRE ANGLED .035 180CM (WIRE) ×1 IMPLANT
KIT ENCORE 26 ADVANTAGE (KITS) ×1 IMPLANT
NDL ENTRY 21GA 7CM ECHOTIP (NEEDLE) IMPLANT
NEEDLE ENTRY 21GA 7CM ECHOTIP (NEEDLE) ×2 IMPLANT
PACK ANGIOGRAPHY (CUSTOM PROCEDURE TRAY) ×2 IMPLANT
SET INTRO CAPELLA COAXIAL (SET/KITS/TRAYS/PACK) ×1 IMPLANT
SHEATH BRITE TIP 6FRX5.5 (SHEATH) ×1 IMPLANT
SHEATH PROBE COVER 6X72 (BAG) ×1 IMPLANT

## 2022-06-18 NOTE — Op Note (Signed)
OPERATIVE NOTE   PROCEDURE: Contrast injection left brachial cephalic  AV access Percutaneous transluminal angioplasty peripheral segment in two locations to 7 mm  PRE-OPERATIVE DIAGNOSIS: Complication of dialysis access                                                       End Stage Renal Disease  POST-OPERATIVE DIAGNOSIS: same as above   SURGEON: Katha Cabal, M.D.  ANESTHESIA: Conscious sedation was administered under my direct supervision by the interventional radiology RN. IV Versed plus fentanyl were utilized. Continuous ECG, pulse oximetry and blood pressure was monitored throughout the entire procedure.  Conscious sedation was for a total of 43 minutes.  ESTIMATED BLOOD LOSS: minimal  FINDING(S): Stricture of the AV graft peripheral segment 2 locations  SPECIMEN(S):  None  CONTRAST: 30 cc  FLUOROSCOPY TIME: 3.2 minutes  INDICATIONS: Rachel Holmes is a 61 y.o. female who  presents with malfunctioning left brachiocephalic AV access.  The patient is scheduled for angiography with possible intervention of the AV access.  The patient is aware the risks include but are not limited to: bleeding, infection, thrombosis of the cannulated access, and possible anaphylactic reaction to the contrast.  The patient acknowledges if the access can not be salvaged a tunneled catheter will be needed and will be placed during this procedure.  The patient is aware of the risks of the procedure and elects to proceed with the angiogram and intervention.  DESCRIPTION: After full informed written consent was obtained, the patient was brought back to the Special Procedure suite and placed supine position.  Appropriate cardiopulmonary monitors were placed.  The left arm was prepped and draped in the standard fashion.  Appropriate timeout is called. The left brachiocephalic fistula was cannulated with a micropuncture needle.  Cannulation was performed with ultrasound guidance. Ultrasound was  placed in a sterile sleeve, the AV access was interrogated and noted to be echolucent and compressible indicating patency. Image was recorded for the permanent record. The puncture is performed under continuous ultrasound visualization.   The microwire was advanced and the needle was exchanged for  a microsheath.  The J-wire was then advanced and a 6 Fr sheath inserted.  Hand injections were completed to image the access from the arterial anastomosis through the entire access.  The central venous structures were also imaged by hand injections.  Injection of contrast demonstrates the vein is mildly tortuous in the mid upper arm level where the ultrasound noted there is a greater than 80% stricture it appears quite focal.  The vein otherwise appears to be 6 to 7 mm in diameter.  Then just distal to the cephalic subclavian confluence there is another focal napkin ringlike stricture of at least 80%.  The actual subclavian confluence is widely patent as is the subclavian and Ottoman vein and superior vena cava.  Based on the images,  4000 units of heparin was given and a glide  wire was negotiated through the strictures within the venous portion of the graft.  An 4 mm x 60 mm Lutonix balloon was positioned across the stricture noted just distal to the cephalic subclavian confluence within the cephalic vein.  Inflation was to 10 atm for 1 minute.  Minimal waist was noted and I selected a 6 mm x 40 mm Lutonix drug-eluting balloon and treated  the same area at this time a good waist was noted.  Inflation was to 12 atm for 1 minute.  Follow-up imaging demonstrated greater than 40% residual stenosis and I used a 7 mm x 40 mm Dorado balloon inflated to 26 atm for 1 minute.  I then repositioned the 7 mm x 40 mm Dorado balloon to the stricture located in the mid vein level essentially centered at the mid biceps.  Inflation was to 18 atm for 1 full minute.  Follow-up imaging demonstrates complete resolution of both of the  strictures with rapid flow of contrast through the graft there is now less than 10% residual stenosis, the central venous anatomy is preserved.  A 4-0 Monocryl purse-string suture was sewn around the sheath.  The sheath was removed and light pressure was applied.  A sterile bandage was applied to the puncture site.    COMPLICATIONS: None  CONDITION: Rachel Holmes, M.D [facility] Office: [phone]  06/18/2022 3:57 PM

## 2022-06-18 NOTE — Interval H&P Note (Signed)
History and Physical Interval Note:  06/18/2022 2:39 PM  Rachel Holmes  has presented today for surgery, with the diagnosis of L Arm Fistula   End Stage Renal.  The various methods of treatment have been discussed with the patient and family. After consideration of risks, benefits and other options for treatment, the patient has consented to  Procedure(s): A/V Fistulagram (Left) as a surgical intervention.  The patient's history has been reviewed, patient examined, no change in status, stable for surgery.  I have reviewed the patient's chart and labs.  Questions were answered to the patient's satisfaction.     [name]

## 2022-06-19 ENCOUNTER — Encounter: Payer: Self-pay | Admitting: Vascular Surgery

## 2022-06-25 ENCOUNTER — Ambulatory Visit: Payer: Medicare Other | Admitting: Cardiology

## 2022-06-25 NOTE — Progress Notes (Deleted)
Cardiology Clinic Note   Patient Name: Rachel Holmes Date of Encounter: 06/25/2022  Primary Care Provider:  Sharyne Peach, MD Primary Cardiologist:  Ida Rogue, MD  Patient Profile    61 year old female with a past medical history of hypertension, anemia of chronic disease, obesity, chronic diastolic congestive heart failure, gastroesophageal reflux disease, bradycardia, anxiety, hyperparathyroidism, end-stage renal disease, and type 2 diabetes, who is here today to follow-up on her chronic diastolic congestive heart failure.   Past Medical History    Past Medical History:  Diagnosis Date   Allergic rhinitis    Allergy    Anemia    Anxiety    Aortic stenosis 09/13/2016   a.) TTE 09/13/2016: mild AS --> MPG 11.2 mmHg. b.) TTE 12/27/2020: EF 55-60%; no AS --> MPG 8 mmHg.   Aortic valve endocarditis 12/25/2018   a.) TTE 12/25/2018: 0.75 x 1 cm AV mass. b.) TEE 12/28/2018: small shaggy mobile density on aortic side of RIGHT coronary cusp consistent with vegetation.   Arthritis    Back pain    Bradycardia    Breast mass    Patient can no longer palpate specific masses but showed tech general area of concern   Charcot's joint of ankle, left    CHF (congestive heart failure) ([place]) 07/09/2007   a.) TTE 07/09/2007: EF 50%; G1DD. b.) TTE 09/13/2016: EF 55%; G2DD. c.) TTE 12/27/2020: EF 55-60%; G2DD; GLS -15.1%.   CKD (chronic kidney disease), stage III (HCC)    Complication of anesthesia    a.) PONV. b.) Delayed emergence   Constipation    Diabetic nephropathy ([place])    Dyspnea    Family history of adverse reaction to anesthesia    a.) Sisters x 2 with (+) delayed emergence.   GERD (gastroesophageal reflux disease)    Heart murmur    HLD (hyperlipidemia)    Hyperparathyroidism ([place])    Hypertension    Joint pain    Leg edema    Legally blind in left eye, as defined in [place]    Lymphedema    Mild pulmonary hypertension ([place]) 09/13/2016   a.) TTE 09/13/2016: EF 55%;  RVSP 45 mmHg. b.) TTE 12/25/2018: EF 55-60%; RVSP 35.5 mmHg.   Obesity    Onychomycosis    PONV (postoperative nausea and vomiting)    S/P BKA (below knee amputation) unilateral, left ([place])    Sepsis ([place]) 12/2018   a.) group G streptococcal bacteremia with AV endocarditis and LLE soft tissue infection.   T2DM (type 2 diabetes mellitus) [facility])    Past Surgical History:  Procedure Laterality Date   A/V FISTULAGRAM Left 06/18/2022   Procedure: A/V Fistulagram;  Surgeon: Katha Cabal, MD;  Location: [facility] [facility];  Service: Cardiovascular;  Laterality: Left;   ABDOMINAL HYSTERECTOMY     AMPUTATION Left 05/05/2019   Procedure: AMPUTATION BELOW KNEE;  Surgeon: Katha Cabal, MD;  Location: [facility];  Service: Vascular;  Laterality: Left;   AV FISTULA PLACEMENT Right 09/19/2021   Procedure: ARTERIOVENOUS (AV) FISTULA CREATION (BRACHIAL CEPHALIC);  Surgeon: Katha Cabal, MD;  Location: [facility];  Service: Vascular;  Laterality: Right;   AV FISTULA PLACEMENT Left 11/09/2021   Procedure: ARTERIOVENOUS (AV) FISTULA CREATION ( BRACHIAL CEPHALIC );  Surgeon: Katha Cabal, MD;  Location: [facility];  Service: Vascular;  Laterality: Left;   BREAST BIOPSY Left 2014   FNA 12:00 position - Negative   DIALYSIS/PERMA CATHETER INSERTION N/A 03/28/2022   Procedure: DIALYSIS/PERMA CATHETER  INSERTION;  Surgeon: Algernon Huxley, MD;  Location: [place] [facility];  Service: Cardiovascular;  Laterality: N/A;   EYE SURGERY Left 2007   removed a lens, no lens implanted   IR FLUORO GUIDE CV LINE RIGHT  12/29/2018   IR REMOVAL TUN CV CATH W/O FL  02/19/2019   IR US GUIDE VASC ACCESS RIGHT  12/29/2018   TEE WITHOUT CARDIOVERSION N/A 12/28/2018   Procedure: TRANSESOPHAGEAL ECHOCARDIOGRAM (TEE);  Surgeon: Sueanne Margarita, MD;  Location: [facility];  Service: Cardiovascular;  Laterality: N/A;    Allergies  Allergies  Allergen Reactions   Nifedipine Rash   Penicillins Hives, Shortness Of  Breath and Swelling   Statins Shortness Of Breath    Wheezing   Hydralazine Itching   Hydrocodone Itching and Nausea Only   Oxycodone Nausea Only   Codeine Nausea Only   Ibuprofen Other (See Comments)    Raises blood pressure    History of Present Illness    ***  Home Medications    Current Outpatient Medications  Medication Sig Dispense Refill   acetaminophen (TYLENOL) 500 MG tablet Take 1,000 mg by mouth every 6 (six) hours as needed.     albuterol (PROVENTIL HFA;VENTOLIN HFA) 108 (90 Base) MCG/ACT inhaler Inhale 1-2 puffs into the lungs every 6 (six) hours as needed for wheezing or shortness of breath.     aspirin EC 81 MG EC tablet Take 1 tablet (81 mg total) by mouth daily.     Blood Glucose Monitoring Suppl (ONE TOUCH ULTRA 2) w/Device KIT 1 Device by Does not apply route daily. 1 each 0   calcitRIOL (ROCALTROL) 0.25 MCG capsule Take 0.25 mcg by mouth daily.     cetirizine (ZYRTEC) 10 MG tablet Take 10 mg by mouth at bedtime.     clobetasol cream (TEMOVATE) 6.38 % Apply 1 application topically 2 (two) times daily as needed (rash).     cloNIDine (CATAPRES) 0.3 MG tablet Take 1 tablet (0.3 mg total) by mouth 2 (two) times daily. 180 tablet 3   Continuous Blood Gluc Receiver (FREESTYLE LIBRE 14 DAY READER) DEVI USE AS DIRECTED 1 each 0   Continuous Blood Gluc Sensor (FREESTYLE LIBRE 14 DAY SENSOR) MISC USE DEVICE FOR 14 DAYS AS DIRECTED 6 each 0   docusate sodium (COLACE) 100 MG capsule Take 1 capsule (100 mg total) by mouth 2 (two) times daily. 10 capsule 0   doxazosin (CARDURA) 4 MG tablet Take 1 tablet (4 mg total) by mouth at bedtime. 90 tablet 3   DULoxetine (CYMBALTA) 30 MG capsule Take 30 mg by mouth at bedtime.     ezetimibe (ZETIA) 10 MG tablet Take 1 tablet (10 mg total) by mouth daily. (Patient taking differently: Take 10 mg by mouth at bedtime.) 90 tablet 3   gabapentin (NEURONTIN) 600 MG tablet Take 300 mg by mouth at bedtime.     gabapentin (NEURONTIN) 600 MG tablet  Take 300 mg by mouth daily.     glucosamine-chondroitin 500-400 MG tablet Take 1 tablet by mouth in the morning and at bedtime.     insulin lispro (HUMALOG KWIKPEN) 100 UNIT/ML KwikPen 3 times a day (just before each meal) 16-18-19 units, and pen needles 3 day. (Patient taking differently: Inject 25 Units into the skin 3 (three) times daily with meals.)     Insulin Pen Needle (BD PEN NEEDLE MICRO U/F) 32G X 6 MM MISC 1 each by Does not apply route 3 (three) times daily. 100 each 3  isosorbide mononitrate (IMDUR) 120 MG 24 hr tablet Take 1 tablet (120 mg total) by mouth daily. (Patient taking differently: Take 120 mg by mouth in the morning and at bedtime.) 180 tablet 3   lactulose (CHRONULAC) 10 GM/15ML solution Take 15 mLs by mouth daily as needed.     Lancets (ONETOUCH ULTRASOFT) lancets Used to check blood sugars four times daily. 200 each 12   methocarbamol (ROBAXIN) 750 MG tablet Take 1 tablet (750 mg total) by mouth every 12 (twelve) hours as needed for muscle spasms. 60 tablet 2   Multiple Vitamin (MULTIVITAMIN WITH MINERALS) TABS tablet Take 1 tablet by mouth daily.     olmesartan (BENICAR) 40 MG tablet Take 40 mg by mouth at bedtime.     omeprazole (PRILOSEC) 40 MG capsule Take 40 mg by mouth 2 (two) times daily with breakfast and lunch.     ondansetron (ZOFRAN ODT) 4 MG disintegrating tablet Take 1 tablet (4 mg total) by mouth every 8 (eight) hours as needed for nausea or vomiting. 20 tablet 0   ONETOUCH VERIO test strip USE TO CHECK BLOOD SUGAR TWICE DAILY 100 each 0   silver sulfADIAZINE (SILVADENE) 1 % cream Apply 1 application topically daily. 50 g 0   torsemide (DEMADEX) 100 MG tablet Take 100 mg by mouth every morning.     traMADol (ULTRAM) 50 MG tablet Take 1 tablet (50 mg total) by mouth every 6 (six) hours as needed for moderate pain or severe pain. (Patient not taking: Reported on 06/07/2022) 36 tablet 0   TRULICITY 4.5 TG/5.4DI SOPN Inhale 4.5 mg into the lungs every 7 (seven)  days.     vitamin B-12 (CYANOCOBALAMIN) 1000 MCG tablet Take 1,000 mcg by mouth daily.     No current facility-administered medications for this visit.     Family History    Family History  Problem Relation Age of Onset   Breast cancer Sister 58   Diabetes Sister    Diabetes Mother    Hypertension Mother    Hyperlipidemia Mother    Eating disorder Mother    Obesity Mother    She indicated that the status of her mother is unknown. She indicated that the status of her sister is unknown.  Social History    Social History   Socioeconomic History   Marital status: Divorced    Spouse name: Not on file   Number of children: 1   Years of education: Not on file   Highest education level: Not on file  Occupational History   Occupation: Glass blower/designer    Comment: disability  Tobacco Use   Smoking status: Never   Smokeless tobacco: Never  Vaping Use   Vaping Use: Never used  Substance and Sexual Activity   Alcohol use: No   Drug use: Never   Sexual activity: Not on file  Other Topics Concern   Not on file  Social History Narrative   ** Merged History Encounter **    on disability   Lives alone   Social Determinants of Health   Financial Resource Strain: Not on file  Food Insecurity: Not on file  Transportation Needs: Not on file  Physical Activity: Not on file  Stress: Not on file  Social Connections: Not on file  Intimate Partner Violence: Not on file     Review of Systems    General:  No chills, fever, night sweats or weight changes.  Cardiovascular:  No chest pain, dyspnea on exertion, edema, orthopnea, palpitations, paroxysmal  nocturnal dyspnea. Dermatological: No rash, lesions/masses Respiratory: No cough, dyspnea Urologic: No hematuria, dysuria Abdominal:   No nausea, vomiting, diarrhea, bright red blood per rectum, melena, or hematemesis Neurologic:  No visual changes, wkns, changes in mental status. All other systems reviewed and are otherwise  negative except as noted above.     Physical Exam    VS:  There were no vitals taken for this visit. , BMI There is no height or weight on file to calculate BMI.     GEN: Well nourished, well developed, in no acute distress. HEENT: normal. Neck: Supple, no JVD, carotid bruits, or masses. Cardiac: RRR, no murmurs, rubs, or gallops. No clubbing, cyanosis, edema.  Radials/DP/PT 2+ and equal bilaterally.  Respiratory:  Respirations regular and unlabored, clear to auscultation bilaterally. GI: Soft, nontender, nondistended, BS + x 4. MS: no deformity or atrophy. Skin: warm and dry, no rash. Neuro:  Strength and sensation are intact. Psych: Normal affect.  Accessory Clinical Findings    ECG personally reviewed by me today- *** - No acute changes  Lab Results  Component Value Date   WBC 6.9 10/31/2021   HGB 10.2 (L) 11/09/2021   HCT 30.0 (L) 11/09/2021   MCV 91.1 10/31/2021   PLT 219 10/31/2021   Lab Results  Component Value Date   CREATININE 4.60 (H) 11/09/2021   BUN 76 (H) 11/09/2021   NA 134 (L) 11/09/2021   K 4.3 11/09/2021   CL 98 11/09/2021   CO2 25 10/31/2021   Lab Results  Component Value Date   ALT 16 10/15/2021   AST 23 10/15/2021   ALKPHOS 112 10/15/2021   BILITOT 0.5 10/15/2021   Lab Results  Component Value Date   CHOL 191 06/25/2018   HDL 47 06/25/2018   LDLCALC 123 (H) 06/25/2018   TRIG 104 06/25/2018    Lab Results  Component Value Date   HGBA1C 7.5 (A) 07/13/2020    Assessment & Plan   1.  ***  Jodell Weitman, NP 06/25/2022, 12:28 PM

## 2022-06-26 ENCOUNTER — Encounter: Payer: Self-pay | Admitting: Cardiology

## 2022-07-01 ENCOUNTER — Ambulatory Visit (INDEPENDENT_AMBULATORY_CARE_PROVIDER_SITE_OTHER): Payer: Medicare Other | Admitting: Vascular Surgery

## 2022-07-01 ENCOUNTER — Encounter (INDEPENDENT_AMBULATORY_CARE_PROVIDER_SITE_OTHER): Payer: Medicare Other

## 2022-07-26 ENCOUNTER — Other Ambulatory Visit (INDEPENDENT_AMBULATORY_CARE_PROVIDER_SITE_OTHER): Payer: Self-pay | Admitting: Vascular Surgery

## 2022-07-26 DIAGNOSIS — Z9862 Peripheral vascular angioplasty status: Secondary | ICD-10-CM

## 2022-07-26 DIAGNOSIS — N186 End stage renal disease: Secondary | ICD-10-CM

## 2022-07-29 ENCOUNTER — Encounter (INDEPENDENT_AMBULATORY_CARE_PROVIDER_SITE_OTHER): Payer: Self-pay | Admitting: Vascular Surgery

## 2022-07-29 ENCOUNTER — Ambulatory Visit (INDEPENDENT_AMBULATORY_CARE_PROVIDER_SITE_OTHER): Payer: Medicare Other

## 2022-07-29 ENCOUNTER — Ambulatory Visit (INDEPENDENT_AMBULATORY_CARE_PROVIDER_SITE_OTHER): Payer: Medicare Other | Admitting: Vascular Surgery

## 2022-07-29 VITALS — BP 192/69 | HR 61 | Resp 16

## 2022-07-29 DIAGNOSIS — S88112A Complete traumatic amputation at level between knee and ankle, left lower leg, initial encounter: Secondary | ICD-10-CM

## 2022-07-29 DIAGNOSIS — E1151 Type 2 diabetes mellitus with diabetic peripheral angiopathy without gangrene: Secondary | ICD-10-CM | POA: Diagnosis not present

## 2022-07-29 DIAGNOSIS — N186 End stage renal disease: Secondary | ICD-10-CM

## 2022-07-29 DIAGNOSIS — Z9862 Peripheral vascular angioplasty status: Secondary | ICD-10-CM

## 2022-07-29 DIAGNOSIS — Z794 Long term (current) use of insulin: Secondary | ICD-10-CM

## 2022-07-29 DIAGNOSIS — I10 Essential (primary) hypertension: Secondary | ICD-10-CM | POA: Diagnosis not present

## 2022-07-29 DIAGNOSIS — K219 Gastro-esophageal reflux disease without esophagitis: Secondary | ICD-10-CM

## 2022-07-29 NOTE — H&P (View-Only) (Signed)
MRN : [id number]  Rachel Holmes is a 61 y.o. (1960-11-15) female who presents with chief complaint of check access.  History of Present Illness:   The patient returns to the office for followup status post intervention of the dialysis access on 06/18/2022.   Procedure: Percutaneous transluminal angioplasty peripheral segment in two locations to 7 mm  Following the intervention the excess function was unchanged per the patient.  The patient denies an increase in arm swelling. At the present time the patient denies hand pain.   No history of rest pain symptoms. No new ulcers or wounds of the lower extremities have occurred.  The patient denies amaurosis fugax or recent TIA symptoms. There are no recent neurological changes noted. There is no history of DVT, PE or superficial thrombophlebitis. No recent episodes of angina or shortness of breath documented.   Duplex ultrasound of the AV access shows a patent access.  The previously noted stenosis is not improved compared to last study, PSV of 539 cm/sec at the cephalic confluence.  Flow volume today is 1073 cc/min (previous flow volume was 1563 cc/min).  Also depth measurements demonstrate that the cephalic vein is greater than 1 cm deep throughout most of its course in the upper arm.  Current Meds  Medication Sig   acetaminophen (TYLENOL) 500 MG tablet Take 1,000 mg by mouth every 6 (six) hours as needed.   albuterol (PROVENTIL HFA;VENTOLIN HFA) 108 (90 Base) MCG/ACT inhaler Inhale 1-2 puffs into the lungs every 6 (six) hours as needed for wheezing or shortness of breath.   aspirin EC 81 MG EC tablet Take 1 tablet (81 mg total) by mouth daily.   Blood Glucose Monitoring Suppl (ONE TOUCH ULTRA 2) w/Device KIT 1 Device by Does not apply route daily.   calcitRIOL (ROCALTROL) 0.25 MCG capsule Take 0.25 mcg by mouth daily.   cetirizine (ZYRTEC) 10 MG tablet Take 10 mg by mouth at bedtime.   clobetasol cream  (TEMOVATE) 3.82 % Apply 1 application topically 2 (two) times daily as needed (rash).   cloNIDine (CATAPRES) 0.3 MG tablet Take 1 tablet (0.3 mg total) by mouth 2 (two) times daily.   Continuous Blood Gluc Receiver (FREESTYLE LIBRE 14 DAY READER) DEVI USE AS DIRECTED   Continuous Blood Gluc Sensor (FREESTYLE LIBRE 14 DAY SENSOR) MISC USE DEVICE FOR 14 DAYS AS DIRECTED   docusate sodium (COLACE) 100 MG capsule Take 1 capsule (100 mg total) by mouth 2 (two) times daily.   doxazosin (CARDURA) 4 MG tablet Take 1 tablet (4 mg total) by mouth at bedtime.   DULoxetine (CYMBALTA) 30 MG capsule Take 30 mg by mouth at bedtime.   ezetimibe (ZETIA) 10 MG tablet Take 1 tablet (10 mg total) by mouth daily. (Patient taking differently: Take 10 mg by mouth at bedtime.)   gabapentin (NEURONTIN) 600 MG tablet Take 300 mg by mouth at bedtime.   gabapentin (NEURONTIN) 600 MG tablet Take 300 mg by mouth daily.   glucosamine-chondroitin 500-400 MG tablet Take 1 tablet by mouth in the morning and at bedtime.   insulin lispro (HUMALOG KWIKPEN) 100 UNIT/ML KwikPen 3 times a day (just before each meal) 16-18-19 units, and pen needles 3 day. (Patient taking differently: Inject 25 Units into the skin 3 (three) times daily with meals.)   Insulin Pen Needle (BD PEN NEEDLE MICRO U/F) 32G X 6 MM  MISC 1 each by Does not apply route 3 (three) times daily.   isosorbide mononitrate (IMDUR) 120 MG 24 hr tablet Take 1 tablet (120 mg total) by mouth daily. (Patient taking differently: Take 120 mg by mouth in the morning and at bedtime.)   lactulose (CHRONULAC) 10 GM/15ML solution Take 15 mLs by mouth daily as needed.   Lancets (ONETOUCH ULTRASOFT) lancets Used to check blood sugars four times daily.   methocarbamol (ROBAXIN) 750 MG tablet Take 1 tablet (750 mg total) by mouth every 12 (twelve) hours as needed for muscle spasms.   Multiple Vitamin (MULTIVITAMIN WITH MINERALS) TABS tablet Take 1 tablet by mouth daily.   olmesartan  (BENICAR) 40 MG tablet Take 40 mg by mouth at bedtime.   omeprazole (PRILOSEC) 40 MG capsule Take 40 mg by mouth 2 (two) times daily with breakfast and lunch.   ondansetron (ZOFRAN ODT) 4 MG disintegrating tablet Take 1 tablet (4 mg total) by mouth every 8 (eight) hours as needed for nausea or vomiting.   ONETOUCH VERIO test strip USE TO CHECK BLOOD SUGAR TWICE DAILY   silver sulfADIAZINE (SILVADENE) 1 % cream Apply 1 application topically daily.   torsemide (DEMADEX) 100 MG tablet Take 100 mg by mouth every morning.   TRULICITY 4.5 NU/2.7OZ SOPN Inhale 4.5 mg into the lungs every 7 (seven) days.   vitamin B-12 (CYANOCOBALAMIN) 1000 MCG tablet Take 1,000 mcg by mouth daily.    Past Medical History:  Diagnosis Date   Allergic rhinitis    Allergy    Anemia    Anxiety    Aortic stenosis 09/13/2016   a.) TTE 09/13/2016: mild AS --> MPG 11.2 mmHg. b.) TTE 12/27/2020: EF 55-60%; no AS --> MPG 8 mmHg.   Aortic valve endocarditis 12/25/2018   a.) TTE 12/25/2018: 0.75 x 1 cm AV mass. b.) TEE 12/28/2018: small shaggy mobile density on aortic side of RIGHT coronary cusp consistent with vegetation.   Arthritis    Back pain    Bradycardia    Breast mass    Patient can no longer palpate specific masses but showed tech general area of concern   Charcot's joint of ankle, left    CHF (congestive heart failure) ([place]) 07/09/2007   a.) TTE 07/09/2007: EF 50%; G1DD. b.) TTE 09/13/2016: EF 55%; G2DD. c.) TTE 12/27/2020: EF 55-60%; G2DD; GLS -15.1%.   CKD (chronic kidney disease), stage III (HCC)    Complication of anesthesia    a.) PONV. b.) Delayed emergence   Constipation    Diabetic nephropathy ([place])    Dyspnea    Family history of adverse reaction to anesthesia    a.) Sisters x 2 with (+) delayed emergence.   GERD (gastroesophageal reflux disease)    Heart murmur    HLD (hyperlipidemia)    Hyperparathyroidism ([place])    Hypertension    Joint pain    Leg edema    Legally blind in left eye, as  defined in [place]    Lymphedema    Mild pulmonary hypertension ([place]) 09/13/2016   a.) TTE 09/13/2016: EF 55%; RVSP 45 mmHg. b.) TTE 12/25/2018: EF 55-60%; RVSP 35.5 mmHg.   Obesity    Onychomycosis    PONV (postoperative nausea and vomiting)    S/P BKA (below knee amputation) unilateral, left ([place])    Sepsis ([place]) 12/2018   a.) group G streptococcal bacteremia with AV endocarditis and LLE soft tissue infection.   T2DM (type 2 diabetes mellitus) [facility])     Past Surgical  History:  Procedure Laterality Date   A/V FISTULAGRAM Left 06/18/2022   Procedure: A/V Fistulagram;  Surgeon: Katha Cabal, MD;  Location: [place] [facility];  Service: Cardiovascular;  Laterality: Left;   ABDOMINAL HYSTERECTOMY     AMPUTATION Left 05/05/2019   Procedure: AMPUTATION BELOW KNEE;  Surgeon: Katha Cabal, MD;  Location: [facility] OR[facility];  Service: Vascular;  Laterality: Left;   AV FISTULA PLACEMENT Right 09/19/2021   Procedure: ARTERIOVENOUS (AV) FISTULA CREATION (BRACHIAL CEPHALIC);  Surgeon: Katha Cabal, MD;  Location: [facility];  Service: Vascular;  Laterality: Right;   AV FISTULA PLACEMENT Left 11/09/2021   Procedure: ARTERIOVENOUS (AV) FISTULA CREATION ( BRACHIAL CEPHALIC );  Surgeon: Katha Cabal, MD;  Location: [facility];  Service: Vascular;  Laterality: Left;   BREAST BIOPSY Left 2014   FNA 12:00 position - Negative   DIALYSIS/PERMA CATHETER INSERTION N/A 03/28/2022   Procedure: DIALYSIS/PERMA CATHETER INSERTION;  Surgeon: Algernon Huxley, MD;  Location: [place];  Service: Cardiovascular;  Laterality: N/A;   EYE SURGERY Left 2007   removed a lens, no lens implanted   IR FLUORO GUIDE CV LINE RIGHT  12/29/2018   IR REMOVAL TUN CV CATH W/O FL  02/19/2019   IR US GUIDE VASC ACCESS RIGHT  12/29/2018   TEE WITHOUT CARDIOVERSION N/A 12/28/2018   Procedure: TRANSESOPHAGEAL ECHOCARDIOGRAM (TEE);  Surgeon: Sueanne Margarita, MD;  Location[facility] ENDOS[facility];  Service: Cardiovascular;   Laterality: N/A;    Social History Social History   Tobacco Use   Smoking status: Never   Smokeless tobacco: Never  Vaping Use   Vaping Use: Never used  Substance Use Topics   Alcohol use: No   Drug use: Never    Family History Family History  Problem Relation Age of Onset   Breast cancer Sister 36   Diabetes Sister    Diabetes Mother    Hypertension Mother    Hyperlipidemia Mother    Eating disorder Mother    Obesity Mother     Allergies  Allergen Reactions   Nifedipine Rash   Penicillins Hives, Shortness Of Breath and Swelling   Statins Shortness Of Breath    Wheezing   Hydralazine Itching   Hydrocodone Itching and Nausea Only   Oxycodone Nausea Only   Codeine Nausea Only   Ibuprofen Other (See Comments)    Raises blood pressure     REVIEW OF SYSTEMS (Negative unless checked)  Constitutional: _0 Weight loss  _1 Fever  _2 Chills Cardiac: _3 Chest pain   _4 Chest pressure   _5 Palpitations   _6 Shortness of breath when laying flat   _7 Shortness of breath with exertion. Vascular:  _8 Pain in legs with walking   _9 Pain in legs at rest  _10 History of DVT   _11 Phlebitis   _12 Swelling in legs   _13 Varicose veins   _14 Non-healing ulcers Pulmonary:   _15 Uses home oxygen   _16 Productive cough   _17 Hemoptysis   _18 Wheeze  _19 COPD   _20 Asthma Neurologic:  _21 Dizziness   _22 Seizures   _23 History of stroke   _24 History of TIA  _25 Aphasia   _26 Vissual changes   _27 Weakness or numbness in arm   _28 Weakness or numbness in leg Musculoskeletal:   _29 Joint swelling   _30 Joint pain   _31 Low back pain Hematologic:  _32 Easy bruising  _33 Easy bleeding   _34 Hypercoagulable state   _35 Anemic Gastrointestinal:  _36 Diarrhea   _37 Vomiting  _38 Gastroesophageal reflux/heartburn   _39 Difficulty swallowing. Genitourinary:  _40 Chronic kidney disease   _41 Difficult urination  _42 Frequent urination   _43 Blood in  urine Skin:  _0 Rashes   _1 Ulcers  Psychological:  _2 History of anxiety   _3  History of major depression.  Physical  Examination  Vitals:   07/29/22 1357  BP: (!) 192/69  Pulse: 61  Resp: 16   There is no height or weight on file to calculate BMI. Gen: WD/WN, NAD Head: [place]/AT, No temporalis wasting.  Ear/Nose/Throat: Hearing grossly intact, nares w/o erythema or drainage Eyes: PER, EOMI, sclera nonicteric.  Neck: Supple, no gross masses or lesions.  No JVD.  Pulmonary:  Good air movement, no audible wheezing, no use of accessory muscles.  Cardiac: RRR, precordium non-hyperdynamic. Vascular:   The left arm fistula remains pulsatile with a staccato thrill.  It is difficult to palpate past or proximal to the antecubital fossa.  Right IJ catheter clean dry and intact Vessel Right Left  Radial Palpable Palpable  Brachial Palpable Palpable  Gastrointestinal: soft, non-distended. No guarding/no peritoneal signs.  Musculoskeletal: M/S 5/5 throughout.  No deformity.  Neurologic: CN 2-12 intact. Pain and light touch intact in extremities.  Symmetrical.  Speech is fluent. Motor exam as listed above. Psychiatric: Judgment intact, Mood & affect appropriate for pt's clinical situation. Dermatologic: No rashes or ulcers noted.  No changes consistent with cellulitis.   CBC Lab Results  Component Value Date   WBC 6.9 10/31/2021   HGB 10.2 (L) 11/09/2021   HCT 30.0 (L) 11/09/2021   MCV 91.1 10/31/2021   PLT 219 10/31/2021    BMET    Component Value Date/Time   NA 134 (L) 11/09/2021 0648   NA 139 06/25/2018 1050   NA 137 11/21/2013 0627   K 4.3 11/09/2021 0648   K 4.6 11/21/2013 0627   CL 98 11/09/2021 0648   CL 107 11/21/2013 0627   CO2 25 10/31/2021 1351   CO2 24 11/21/2013 0627   GLUCOSE 237 (H) 11/09/2021 0648   GLUCOSE 95 11/21/2013 0627   BUN 76 (H) 11/09/2021 0648   BUN 43 (H) 06/25/2018 1050   BUN 34 (H) 11/21/2013 0627   CREATININE 4.60 (H) 11/09/2021 0648   CREATININE 1.80 (H) 09/09/2014 1220   CALCIUM 8.7 (L) 10/31/2021 1351   CALCIUM 8.8 09/09/2014 1220   GFRNONAA 14 (L) 10/31/2021  1351   GFRNONAA 31 (L) 09/09/2014 1220   GFRNONAA 26 (L) 11/21/2013 0627   GFRAA 17 (L) 05/28/2019 2252   GFRAA 38 (L) 09/09/2014 1220   GFRAA 30 (L) 11/21/2013 0627   CrCl cannot be calculated (Patient's most recent lab result is older than the maximum 21 days allowed.).  COAG Lab Results  Component Value Date   INR 1.1 09/17/2021   INR 0.8 05/29/2019   INR 1.1 04/30/2019    Radiology No results found.   Assessment/Plan 1. ESRD (end stage renal disease) ([place]) Recommend:  The patient is experiencing increasing problems with their dialysis access.  The follow-up HDA demonstrates persistence of the stricture at the cephalic subclavian confluence and therefore stent is indicated.  Furthermore the depth of the cephalic vein will not allow for easy cannulation.  Patient should have a fistulagram with the intention for intervention as the first step and then as a second stage elevation of the fistula in the operating room.  The intention for intervention and surgery is to restore appropriate flow and prevent thrombosis and possible loss of the access.  As well as improve the quality of dialysis therapy.  The risks, benefits and alternative therapies were reviewed in detail with the patient.  All questions were  answered.  The patient agrees to proceed with angio/intervention.    The patient will follow up with me in the office after the procedure.   - VAS US DUPLEX DIALYSIS ACCESS (AVF, AVG)  2. Status post angioplasty See #1 - VAS US DUPLEX DIALYSIS ACCESS (AVF, AVG)  3. End stage renal disease ([place]) At the present time the patient has adequate dialysis access.  Continue hemodialysis as ordered without interruption.  Avoid nephrotoxic medications and dehydration.  Further plans per nephrology   4. Essential hypertension Continue antihypertensive medications as already ordered, these medications have been reviewed and there are no changes at this time.   5. Type 2 diabetes  mellitus with diabetic peripheral angiopathy without gangrene, with long-term current use of insulin ([place]) Continue hypoglycemic medications as already ordered, these medications have been reviewed and there are no changes at this time.  Hgb A1C to be monitored as already arranged by primary service   6. Gastro-esophageal reflux disease without esophagitis Continue PPI as already ordered, this medication has been reviewed and there are no changes at this time.  Avoidence of caffeine and alcohol  Moderate elevation of the head of the bed    7. Below-knee amputation of left lower extremity ([place]) Continue prosthesis no changes    [name], MD  07/29/2022 2:32 PM

## 2022-07-29 NOTE — Progress Notes (Signed)
                       MRN : [id number]  Rachel Holmes is a 61 y.o. (02/12/1961) female who presents with chief complaint of check access.  History of Present Illness:   The patient returns to the office for followup status post intervention of the dialysis access on 06/18/2022.   Procedure: Percutaneous transluminal angioplasty peripheral segment in two locations to 7 mm  Following the intervention the excess function was unchanged per the patient.  The patient denies an increase in arm swelling. At the present time the patient denies hand pain.   No history of rest pain symptoms. No new ulcers or wounds of the lower extremities have occurred.  The patient denies amaurosis fugax or recent TIA symptoms. There are no recent neurological changes noted. There is no history of DVT, PE or superficial thrombophlebitis. No recent episodes of angina or shortness of breath documented.   Duplex ultrasound of the AV access shows a patent access.  The previously noted stenosis is not improved compared to last study, PSV of 539 cm/sec at the cephalic confluence.  Flow volume today is 1073 cc/min (previous flow volume was 1563 cc/min).  Also depth measurements demonstrate that the cephalic vein is greater than 1 cm deep throughout most of its course in the upper arm.  Current Meds  Medication Sig   acetaminophen (TYLENOL) 500 MG tablet Take 1,000 mg by mouth every 6 (six) hours as needed.   albuterol (PROVENTIL HFA;VENTOLIN HFA) 108 (90 Base) MCG/ACT inhaler Inhale 1-2 puffs into the lungs every 6 (six) hours as needed for wheezing or shortness of breath.   aspirin EC 81 MG EC tablet Take 1 tablet (81 mg total) by mouth daily.   Blood Glucose Monitoring Suppl (ONE TOUCH ULTRA 2) w/Device KIT 1 Device by Does not apply route daily.   calcitRIOL (ROCALTROL) 0.25 MCG capsule Take 0.25 mcg by mouth daily.   cetirizine (ZYRTEC) 10 MG tablet Take 10 mg by mouth at bedtime.   clobetasol cream  (TEMOVATE) 0.05 % Apply 1 application topically 2 (two) times daily as needed (rash).   cloNIDine (CATAPRES) 0.3 MG tablet Take 1 tablet (0.3 mg total) by mouth 2 (two) times daily.   Continuous Blood Gluc Receiver (FREESTYLE LIBRE 14 DAY READER) DEVI USE AS DIRECTED   Continuous Blood Gluc Sensor (FREESTYLE LIBRE 14 DAY SENSOR) MISC USE DEVICE FOR 14 DAYS AS DIRECTED   docusate sodium (COLACE) 100 MG capsule Take 1 capsule (100 mg total) by mouth 2 (two) times daily.   doxazosin (CARDURA) 4 MG tablet Take 1 tablet (4 mg total) by mouth at bedtime.   DULoxetine (CYMBALTA) 30 MG capsule Take 30 mg by mouth at bedtime.   ezetimibe (ZETIA) 10 MG tablet Take 1 tablet (10 mg total) by mouth daily. (Patient taking differently: Take 10 mg by mouth at bedtime.)   gabapentin (NEURONTIN) 600 MG tablet Take 300 mg by mouth at bedtime.   gabapentin (NEURONTIN) 600 MG tablet Take 300 mg by mouth daily.   glucosamine-chondroitin 500-400 MG tablet Take 1 tablet by mouth in the morning and at bedtime.   insulin lispro (HUMALOG KWIKPEN) 100 UNIT/ML KwikPen 3 times a day (just before each meal) 16-18-19 units, and pen needles 3 day. (Patient taking differently: Inject 25 Units into the skin 3 (three) times daily with meals.)   Insulin Pen Needle (BD PEN NEEDLE MICRO U/F) 32G X 6 MM   MISC 1 each by Does not apply route 3 (three) times daily.   isosorbide mononitrate (IMDUR) 120 MG 24 hr tablet Take 1 tablet (120 mg total) by mouth daily. (Patient taking differently: Take 120 mg by mouth in the morning and at bedtime.)   lactulose (CHRONULAC) 10 GM/15ML solution Take 15 mLs by mouth daily as needed.   Lancets (ONETOUCH ULTRASOFT) lancets Used to check blood sugars four times daily.   methocarbamol (ROBAXIN) 750 MG tablet Take 1 tablet (750 mg total) by mouth every 12 (twelve) hours as needed for muscle spasms.   Multiple Vitamin (MULTIVITAMIN WITH MINERALS) TABS tablet Take 1 tablet by mouth daily.   olmesartan  (BENICAR) 40 MG tablet Take 40 mg by mouth at bedtime.   omeprazole (PRILOSEC) 40 MG capsule Take 40 mg by mouth 2 (two) times daily with breakfast and lunch.   ondansetron (ZOFRAN ODT) 4 MG disintegrating tablet Take 1 tablet (4 mg total) by mouth every 8 (eight) hours as needed for nausea or vomiting.   ONETOUCH VERIO test strip USE TO CHECK BLOOD SUGAR TWICE DAILY   silver sulfADIAZINE (SILVADENE) 1 % cream Apply 1 application topically daily.   torsemide (DEMADEX) 100 MG tablet Take 100 mg by mouth every morning.   TRULICITY 4.5 MG/0.5ML SOPN Inhale 4.5 mg into the lungs every 7 (seven) days.   vitamin B-12 (CYANOCOBALAMIN) 1000 MCG tablet Take 1,000 mcg by mouth daily.    Past Medical History:  Diagnosis Date   Allergic rhinitis    Allergy    Anemia    Anxiety    Aortic stenosis 09/13/2016   a.) TTE 09/13/2016: mild AS --> MPG 11.2 mmHg. b.) TTE 12/27/2020: EF 55-60%; no AS --> MPG 8 mmHg.   Aortic valve endocarditis 12/25/2018   a.) TTE 12/25/2018: 0.75 x 1 cm AV mass. b.) TEE 12/28/2018: small shaggy mobile density on aortic side of RIGHT coronary cusp consistent with vegetation.   Arthritis    Back pain    Bradycardia    Breast mass    Patient can no longer palpate specific masses but showed tech general area of concern   Charcot's joint of ankle, left    CHF (congestive heart failure) (HCC) 07/09/2007   a.) TTE 07/09/2007: EF 50%; G1DD. b.) TTE 09/13/2016: EF 55%; G2DD. c.) TTE 12/27/2020: EF 55-60%; G2DD; GLS -15.1%.   CKD (chronic kidney disease), stage III (HCC)    Complication of anesthesia    a.) PONV. b.) Delayed emergence   Constipation    Diabetic nephropathy (HCC)    Dyspnea    Family history of adverse reaction to anesthesia    a.) Sisters x 2 with (+) delayed emergence.   GERD (gastroesophageal reflux disease)    Heart murmur    HLD (hyperlipidemia)    Hyperparathyroidism (HCC)    Hypertension    Joint pain    Leg edema    Legally blind in left eye, as  defined in [place]    Lymphedema    Mild pulmonary hypertension (HCC) 09/13/2016   a.) TTE 09/13/2016: EF 55%; RVSP 45 mmHg. b.) TTE 12/25/2018: EF 55-60%; RVSP 35.5 mmHg.   Obesity    Onychomycosis    PONV (postoperative nausea and vomiting)    S/P BKA (below knee amputation) unilateral, left (HCC)    Sepsis (HCC) 12/2018   a.) group G streptococcal bacteremia with AV endocarditis and LLE soft tissue infection.   T2DM (type 2 diabetes mellitus) (HCC)     Past Surgical   History:  Procedure Laterality Date   A/V FISTULAGRAM Left 06/18/2022   Procedure: A/V Fistulagram;  Surgeon: Sofia Jaquith G, MD;  Location: [facility];  Service: Cardiovascular;  Laterality: Left;   ABDOMINAL HYSTERECTOMY     AMPUTATION Left 05/05/2019   Procedure: AMPUTATION BELOW KNEE;  Surgeon: Mozelle Remlinger G, MD;  Location: [facility];  Service: Vascular;  Laterality: Left;   AV FISTULA PLACEMENT Right 09/19/2021   Procedure: ARTERIOVENOUS (AV) FISTULA CREATION (BRACHIAL CEPHALIC);  Surgeon: Graziella Connery G, MD;  Location: [facility];  Service: Vascular;  Laterality: Right;   AV FISTULA PLACEMENT Left 11/09/2021   Procedure: ARTERIOVENOUS (AV) FISTULA CREATION ( BRACHIAL CEPHALIC );  Surgeon: Azeez Dunker G, MD;  Location: [facility];  Service: Vascular;  Laterality: Left;   BREAST BIOPSY Left 2014   FNA 12:00 position - Negative   DIALYSIS/PERMA CATHETER INSERTION N/A 03/28/2022   Procedure: DIALYSIS/PERMA CATHETER INSERTION;  Surgeon: Dew, Jason S, MD;  Location: [facility];  Service: Cardiovascular;  Laterality: N/A;   EYE SURGERY Left 2007   removed a lens, no lens implanted   IR FLUORO GUIDE CV LINE RIGHT  12/29/2018   IR REMOVAL TUN CV CATH W/O FL  02/19/2019   IR US GUIDE VASC ACCESS RIGHT  12/29/2018   TEE WITHOUT CARDIOVERSION N/A 12/28/2018   Procedure: TRANSESOPHAGEAL ECHOCARDIOGRAM (TEE);  Surgeon: Turner, Traci R, MD;  Location: [facility];  Service: Cardiovascular;   Laterality: N/A;    Social History Social History   Tobacco Use   Smoking status: Never   Smokeless tobacco: Never  Vaping Use   Vaping Use: Never used  Substance Use Topics   Alcohol use: No   Drug use: Never    Family History Family History  Problem Relation Age of Onset   Breast cancer Sister 58   Diabetes Sister    Diabetes Mother    Hypertension Mother    Hyperlipidemia Mother    Eating disorder Mother    Obesity Mother     Allergies  Allergen Reactions   Nifedipine Rash   Penicillins Hives, Shortness Of Breath and Swelling   Statins Shortness Of Breath    Wheezing   Hydralazine Itching   Hydrocodone Itching and Nausea Only   Oxycodone Nausea Only   Codeine Nausea Only   Ibuprofen Other (See Comments)    Raises blood pressure     REVIEW OF SYSTEMS (Negative unless checked)  Constitutional: []Weight loss  []Fever  []Chills Cardiac: []Chest pain   []Chest pressure   []Palpitations   []Shortness of breath when laying flat   []Shortness of breath with exertion. Vascular:  []Pain in legs with walking   []Pain in legs at rest  []History of DVT   []Phlebitis   []Swelling in legs   []Varicose veins   []Non-healing ulcers Pulmonary:   []Uses home oxygen   []Productive cough   []Hemoptysis   []Wheeze  []COPD   []Asthma Neurologic:  []Dizziness   []Seizures   []History of stroke   []History of TIA  []Aphasia   []Vissual changes   []Weakness or numbness in arm   []Weakness or numbness in leg Musculoskeletal:   []Joint swelling   []Joint pain   []Low back pain Hematologic:  []Easy bruising  []Easy bleeding   []Hypercoagulable state   []Anemic Gastrointestinal:  []Diarrhea   []Vomiting  [x]Gastroesophageal reflux/heartburn   []Difficulty swallowing. Genitourinary:  [x]Chronic kidney disease   []Difficult urination  []Frequent urination   []Blood in   urine Skin:  []Rashes   []Ulcers  Psychological:  []History of anxiety   [] History of major depression.  Physical  Examination  Vitals:   07/29/22 1357  BP: (!) 192/69  Pulse: 61  Resp: 16   There is no height or weight on file to calculate BMI. Gen: WD/WN, NAD Head: [place]/AT, No temporalis wasting.  Ear/Nose/Throat: Hearing grossly intact, nares w/o erythema or drainage Eyes: PER, EOMI, sclera nonicteric.  Neck: Supple, no gross masses or lesions.  No JVD.  Pulmonary:  Good air movement, no audible wheezing, no use of accessory muscles.  Cardiac: RRR, precordium non-hyperdynamic. Vascular:   The left arm fistula remains pulsatile with a staccato thrill.  It is difficult to palpate past or proximal to the antecubital fossa.  Right IJ catheter clean dry and intact Vessel Right Left  Radial Palpable Palpable  Brachial Palpable Palpable  Gastrointestinal: soft, non-distended. No guarding/no peritoneal signs.  Musculoskeletal: M/S 5/5 throughout.  No deformity.  Neurologic: CN 2-12 intact. Pain and light touch intact in extremities.  Symmetrical.  Speech is fluent. Motor exam as listed above. Psychiatric: Judgment intact, Mood & affect appropriate for pt's clinical situation. Dermatologic: No rashes or ulcers noted.  No changes consistent with cellulitis.   CBC Lab Results  Component Value Date   WBC 6.9 10/31/2021   HGB 10.2 (L) 11/09/2021   HCT 30.0 (L) 11/09/2021   MCV 91.1 10/31/2021   PLT 219 10/31/2021    BMET    Component Value Date/Time   NA 134 (L) 11/09/2021 0648   NA 139 06/25/2018 1050   NA 137 11/21/2013 0627   K 4.3 11/09/2021 0648   K 4.6 11/21/2013 0627   CL 98 11/09/2021 0648   CL 107 11/21/2013 0627   CO2 25 10/31/2021 1351   CO2 24 11/21/2013 0627   GLUCOSE 237 (H) 11/09/2021 0648   GLUCOSE 95 11/21/2013 0627   BUN 76 (H) 11/09/2021 0648   BUN 43 (H) 06/25/2018 1050   BUN 34 (H) 11/21/2013 0627   CREATININE 4.60 (H) 11/09/2021 0648   CREATININE 1.80 (H) 09/09/2014 1220   CALCIUM 8.7 (L) 10/31/2021 1351   CALCIUM 8.8 09/09/2014 1220   GFRNONAA 14 (L) 10/31/2021  1351   GFRNONAA 31 (L) 09/09/2014 1220   GFRNONAA 26 (L) 11/21/2013 0627   GFRAA 17 (L) 05/28/2019 2252   GFRAA 38 (L) 09/09/2014 1220   GFRAA 30 (L) 11/21/2013 0627   CrCl cannot be calculated (Patient's most recent lab result is older than the maximum 21 days allowed.).  COAG Lab Results  Component Value Date   INR 1.1 09/17/2021   INR 0.8 05/29/2019   INR 1.1 04/30/2019    Radiology No results found.   Assessment/Plan 1. ESRD (end stage renal disease) (HCC) Recommend:  The patient is experiencing increasing problems with their dialysis access.  The follow-up HDA demonstrates persistence of the stricture at the cephalic subclavian confluence and therefore stent is indicated.  Furthermore the depth of the cephalic vein will not allow for easy cannulation.  Patient should have a fistulagram with the intention for intervention as the first step and then as a second stage elevation of the fistula in the operating room.  The intention for intervention and surgery is to restore appropriate flow and prevent thrombosis and possible loss of the access.  As well as improve the quality of dialysis therapy.  The risks, benefits and alternative therapies were reviewed in detail with the patient.  All questions were   answered.  The patient agrees to proceed with angio/intervention.    The patient will follow up with me in the office after the procedure.   - VAS US DUPLEX DIALYSIS ACCESS (AVF, AVG)  2. Status post angioplasty See #1 - VAS US DUPLEX DIALYSIS ACCESS (AVF, AVG)  3. End stage renal disease (HCC) At the present time the patient has adequate dialysis access.  Continue hemodialysis as ordered without interruption.  Avoid nephrotoxic medications and dehydration.  Further plans per nephrology   4. Essential hypertension Continue antihypertensive medications as already ordered, these medications have been reviewed and there are no changes at this time.   5. Type 2 diabetes  mellitus with diabetic peripheral angiopathy without gangrene, with long-term current use of insulin (HCC) Continue hypoglycemic medications as already ordered, these medications have been reviewed and there are no changes at this time.  Hgb A1C to be monitored as already arranged by primary service   6. Gastro-esophageal reflux disease without esophagitis Continue PPI as already ordered, this medication has been reviewed and there are no changes at this time.  Avoidence of caffeine and alcohol  Moderate elevation of the head of the bed    7. Below-knee amputation of left lower extremity (HCC) Continue prosthesis no changes    Tearia Gibbs, MD  07/29/2022 2:32 PM    

## 2022-08-08 ENCOUNTER — Telehealth (INDEPENDENT_AMBULATORY_CARE_PROVIDER_SITE_OTHER): Payer: Self-pay

## 2022-08-08 NOTE — Telephone Encounter (Signed)
Spoke with the patient and she is scheduled with Dr. Delana Meyer on 08/13/22 with a 1:00 pm arrival time to the [facility] for a left arm angio. Pre-procedure instructions were discussed and will be mailed.

## 2022-08-13 ENCOUNTER — Encounter: Payer: Self-pay | Admitting: Vascular Surgery

## 2022-08-13 ENCOUNTER — Ambulatory Visit
Admission: RE | Admit: 2022-08-13 | Discharge: 2022-08-13 | Disposition: A | Discharge status: Home / Self Care | Payer: Medicare Other | Attending: Vascular Surgery | Admitting: Vascular Surgery

## 2022-08-13 ENCOUNTER — Encounter
Admission: RE | Disposition: A | Discharge status: Home / Self Care | Payer: Self-pay | Source: Home / Self Care | Attending: Vascular Surgery

## 2022-08-13 ENCOUNTER — Other Ambulatory Visit: Payer: Self-pay

## 2022-08-13 DIAGNOSIS — Y841 Kidney dialysis as the cause of abnormal reaction of the patient, or of later complication, without mention of misadventure at the time of the procedure: Secondary | ICD-10-CM | POA: Insufficient documentation

## 2022-08-13 DIAGNOSIS — Z7985 Long-term (current) use of injectable non-insulin antidiabetic drugs: Secondary | ICD-10-CM | POA: Insufficient documentation

## 2022-08-13 DIAGNOSIS — E1122 Type 2 diabetes mellitus with diabetic chronic kidney disease: Secondary | ICD-10-CM | POA: Diagnosis not present

## 2022-08-13 DIAGNOSIS — Z992 Dependence on renal dialysis: Secondary | ICD-10-CM | POA: Insufficient documentation

## 2022-08-13 DIAGNOSIS — N186 End stage renal disease: Secondary | ICD-10-CM | POA: Insufficient documentation

## 2022-08-13 DIAGNOSIS — T82868A Thrombosis of vascular prosthetic devices, implants and grafts, initial encounter: Secondary | ICD-10-CM

## 2022-08-13 DIAGNOSIS — Z794 Long term (current) use of insulin: Secondary | ICD-10-CM | POA: Insufficient documentation

## 2022-08-13 DIAGNOSIS — T82858A Stenosis of vascular prosthetic devices, implants and grafts, initial encounter: Secondary | ICD-10-CM | POA: Insufficient documentation

## 2022-08-13 DIAGNOSIS — I120 Hypertensive chronic kidney disease with stage 5 chronic kidney disease or end stage renal disease: Secondary | ICD-10-CM | POA: Diagnosis not present

## 2022-08-13 DIAGNOSIS — K219 Gastro-esophageal reflux disease without esophagitis: Secondary | ICD-10-CM | POA: Diagnosis not present

## 2022-08-13 HISTORY — PX: A/V FISTULAGRAM: CATH118298

## 2022-08-13 LAB — GLUCOSE, CAPILLARY: Glucose-Capillary: 236 mg/dL — ABNORMAL HIGH (ref 70–99)

## 2022-08-13 LAB — POTASSIUM (ARMC VASCULAR LAB ONLY): Potassium (ARMC vascular lab): 3.9 mmol/L (ref 3.5–5.1)

## 2022-08-13 SURGERY — A/V FISTULAGRAM
Anesthesia: Moderate Sedation | Laterality: Left

## 2022-08-13 MED ORDER — METHYLPREDNISOLONE SODIUM SUCC 125 MG IJ SOLR: 125.0000 mg | Freq: Once | INTRAMUSCULAR | Status: DC | PRN

## 2022-08-13 MED ORDER — MIDAZOLAM HCL 2 MG/2ML IJ SOLN
INTRAMUSCULAR | Status: DC | PRN
  Administered 2022-08-13: 2 mg via INTRAVENOUS

## 2022-08-13 MED ORDER — FENTANYL CITRATE (PF) 100 MCG/2ML IJ SOLN
INTRAMUSCULAR | Status: DC | PRN
  Administered 2022-08-13: 50 ug via INTRAVENOUS

## 2022-08-13 MED ORDER — ONDANSETRON HCL 4 MG/2ML IJ SOLN
4.0000 mg | Freq: Four times a day (QID) | INTRAMUSCULAR | Status: DC | PRN
Start: 2022-08-13 — End: 2022-08-15

## 2022-08-13 MED ORDER — FAMOTIDINE 20 MG PO TABS: 40.0000 mg | ORAL_TABLET | Freq: Once | ORAL | Status: DC | PRN

## 2022-08-13 MED ORDER — DIPHENHYDRAMINE HCL 50 MG/ML IJ SOLN: 50.0000 mg | Freq: Once | INTRAMUSCULAR | Status: DC | PRN

## 2022-08-13 MED ORDER — HEPARIN SODIUM (PORCINE) 1000 UNIT/ML IJ SOLN
INTRAMUSCULAR | Status: AC
  Filled 2022-08-13: qty 10

## 2022-08-13 MED ORDER — MIDAZOLAM HCL 5 MG/5ML IJ SOLN
INTRAMUSCULAR | Status: AC
  Filled 2022-08-13: qty 5

## 2022-08-13 MED ORDER — IODIXANOL 320 MG/ML IV SOLN
INTRAVENOUS | Status: DC | PRN
  Administered 2022-08-13: 40 mL

## 2022-08-13 MED ORDER — SODIUM CHLORIDE 0.9 % IV SOLN: INTRAVENOUS | Status: DC

## 2022-08-13 MED ORDER — HEPARIN SODIUM (PORCINE) 1000 UNIT/ML IJ SOLN
INTRAMUSCULAR | Status: DC | PRN
  Administered 2022-08-13: 4000 [IU] via INTRAVENOUS

## 2022-08-13 MED ORDER — HYDROMORPHONE HCL 1 MG/ML IJ SOLN: 1.0000 mg | Freq: Once | INTRAMUSCULAR | Status: DC | PRN

## 2022-08-13 MED ORDER — FENTANYL CITRATE PF 50 MCG/ML IJ SOSY
PREFILLED_SYRINGE | INTRAMUSCULAR | Status: AC
  Filled 2022-08-13: qty 2

## 2022-08-13 MED ORDER — MIDAZOLAM HCL 2 MG/ML PO SYRP
8.0000 mg | ORAL_SOLUTION | Freq: Once | ORAL | Status: DC | PRN
Start: 2022-08-13 — End: 2022-08-15

## 2022-08-13 MED ORDER — VANCOMYCIN HCL 1500 MG/300ML IV SOLN
1500.0000 mg | INTRAVENOUS | Status: AC
  Administered 2022-08-13: 1500 mg via INTRAVENOUS
  Filled 2022-08-13: qty 300

## 2022-08-13 SURGICAL SUPPLY — 22 items
BALLN DORADO 8X60X80 (BALLOONS) ×1
BALLN LUTONIX DCB 6X40X130 (BALLOONS) ×1
BALLOON DORADO 8X60X80 (BALLOONS) IMPLANT
BALLOON LUTONIX DCB 6X40X130 (BALLOONS) IMPLANT
CATH BEACON 5 .035 40 KMP TP (CATHETERS) IMPLANT
CATH BEACON 5 .038 40 KMP TP (CATHETERS) ×1
DRAPE BRACHIAL (DRAPES) IMPLANT
KIT ENCORE 26 ADVANTAGE (KITS) IMPLANT
NDL ENTRY 21GA 7CM ECHOTIP (NEEDLE) IMPLANT
NEEDLE ENTRY 21GA 7CM ECHOTIP (NEEDLE) ×1 IMPLANT
PACK ANGIOGRAPHY (CUSTOM PROCEDURE TRAY) ×1 IMPLANT
SET INTRO CAPELLA COAXIAL (SET/KITS/TRAYS/PACK) IMPLANT
SHEATH BRITE TIP 6FRX5.5 (SHEATH) IMPLANT
SHEATH BRITE TIP 7FRX5.5 (SHEATH) IMPLANT
SHEATH PROBE COVER 6X72 (BAG) IMPLANT
STENT VIABAHN 8X2.5X120 (Permanent Stent) IMPLANT
STENT VIABAHN 8X25X120 (Permanent Stent) ×1 IMPLANT
STENT VIABAHN 8X50X120 (Permanent Stent) IMPLANT
STENT VIABAHN5X120X8X (Permanent Stent) ×1 IMPLANT
SUT MNCRL AB 4-0 PS2 18 (SUTURE) IMPLANT
WIRE G V18X300CM (WIRE) IMPLANT
WIRE MAGIC TOR.035 180C (WIRE) IMPLANT

## 2022-08-13 NOTE — Interval H&P Note (Signed)
History and Physical Interval Note:  08/13/2022 4:02 PM  Rachel Holmes  has presented today for surgery, with the diagnosis of L arm fistulagram   End Stage Renal.  The various methods of treatment have been discussed with the patient and family. After consideration of risks, benefits and other options for treatment, the patient has consented to  Procedure(s): A/V Fistulagram (Left) as a surgical intervention.  The patient's history has been reviewed, patient examined, no change in status, stable for surgery.  I have reviewed the patient's chart and labs.  Questions were answered to the patient's satisfaction.     [name]

## 2022-08-13 NOTE — Discharge Instructions (Addendum)
Next Steps: Follow up in 2 week(s) with :  Vascular Surgery  [phone]  [place] [address]   Instructions: follow up after procedure no studies

## 2022-08-13 NOTE — Interval H&P Note (Signed)
History and Physical Interval Note:  08/13/2022 4:03 PM  Rachel Holmes  has presented today for surgery, with the diagnosis of L arm fistulagram   End Stage Renal.  The various methods of treatment have been discussed with the patient and family. After consideration of risks, benefits and other options for treatment, the patient has consented to  Procedure(s): A/V Fistulagram (Left) as a surgical intervention.  The patient's history has been reviewed, patient examined, no change in status, stable for surgery.  I have reviewed the patient's chart and labs.  Questions were answered to the patient's satisfaction.     [name]

## 2022-08-13 NOTE — Op Note (Signed)
OPERATIVE NOTE   PROCEDURE: Contrast injection left arm brachiocephalic fistula AV access Percutaneous transluminal angioplasty and stent placement cephalic subclavian confluence left arm brachiocephalic fistula  PRE-OPERATIVE DIAGNOSIS: Complication of dialysis access                                                       End Stage Renal Disease  POST-OPERATIVE DIAGNOSIS: same as above   SURGEON: Katha Cabal, M.D.  ANESTHESIA: Conscious sedation was administered under my direct supervision by the interventional radiology RN. IV Versed plus fentanyl were utilized. Continuous ECG, pulse oximetry and blood pressure was monitored throughout the entire procedure.  Conscious sedation was for a total of 39 minutes.  ESTIMATED BLOOD LOSS: minimal  FINDING(S): 3 venous strictures within the cephalic vein in a row all up near the cephalic subclavian confluence the proximal 2 are greater than 90% and the more distal 1 greater than 70%  SPECIMEN(S):  None  CONTRAST: 40 cc  FLUOROSCOPY TIME: 2.0 minutes  INDICATIONS: Rachel Holmes is a 61 y.o. female who  presents with malfunctioning left arm AV access.  The patient is scheduled for angiography with possible intervention of the AV access.  The patient is aware the risks include but are not limited to: bleeding, infection, thrombosis of the cannulated access, and possible anaphylactic reaction to the contrast.  The patient acknowledges if the access can not be salvaged a tunneled catheter will be needed and will be placed during this procedure.  The patient is aware of the risks of the procedure and elects to proceed with the angiogram and intervention.  DESCRIPTION: After full informed written consent was obtained, the patient was brought back to the Special Procedure suite and placed supine position.  Appropriate cardiopulmonary monitors were placed.  The left arm was prepped and draped in the standard fashion.  Appropriate timeout is  called. The left brachiocephalic fistula was cannulated with a micropuncture needle.  Cannulation was performed with ultrasound guidance. Ultrasound was placed in a sterile sleeve, the AV access was interrogated and noted to be echolucent and compressible indicating patency. Image was recorded for the permanent record. The puncture is performed under continuous ultrasound visualization.   The microwire was advanced and the needle was exchanged for  a microsheath.  The J-wire was then advanced and a 6 Fr sheath inserted.  Hand injections were completed to image the access from the arterial anastomosis through the entire access.  The central venous structures were also imaged by hand injections.  Based on the images, 4000 units of heparin was given and a wire was negotiated through the strictures within the venous portion of the graft.  An 8 mm x 50 mm Viabahn was deployed across the stenoses at the cephalic subclavian confluence and postdilated with an 8 mm [place] balloon.  Follow-up imaging demonstrated the 50 mm stent had not quite cover the most distal lesion and an 8 mm x 25 mm Viabahn stent was deployed extending more distally and then this was postdilated with a 6 mm x 40 mm Lutonix drug-eluting balloon inflated to 10 atm for 1 minute.  Follow-up imaging demonstrates complete resolution of the stricture, less than 10% residual stenosis with rapid flow of contrast through the graft, the central venous anatomy is preserved.  Reflux imaging of the arterial demonstrates the first  centimeters of the cephalic vein measures 6 to 7 mm in diameter there is then a 80% stenosis.  The vein then balloons to greater than 10 mm but in the middle of this segment there is a focal greater than 90% stenosis.  There are 2 large branches in the midportion of the vein.  The vein is somewhat serpentine.  As noted the initial imaging demonstrated 3 lesions as described above these are all well treated with the Viabahn stent and  postdilatation to 6 to 8 mm with less than 10% residual stenosis.  Central veins are widely patent.  A 4-0 Monocryl purse-string suture was sewn around the sheath.  The sheath was removed and light pressure was applied.  A sterile bandage was applied to the puncture site.    COMPLICATIONS: None  CONDITION: Rachel Holmes, M.D [facility] Office: [phone]  08/13/2022 6:20 PM

## 2022-08-14 ENCOUNTER — Encounter: Payer: Self-pay | Admitting: Vascular Surgery

## 2022-08-29 ENCOUNTER — Encounter (INDEPENDENT_AMBULATORY_CARE_PROVIDER_SITE_OTHER): Payer: Self-pay | Admitting: Nurse Practitioner

## 2022-08-29 ENCOUNTER — Ambulatory Visit (INDEPENDENT_AMBULATORY_CARE_PROVIDER_SITE_OTHER): Payer: Medicare Other | Admitting: Nurse Practitioner

## 2022-08-29 VITALS — BP 169/67 | HR 70 | Resp 18

## 2022-08-29 DIAGNOSIS — I10 Essential (primary) hypertension: Secondary | ICD-10-CM

## 2022-08-29 DIAGNOSIS — E1151 Type 2 diabetes mellitus with diabetic peripheral angiopathy without gangrene: Secondary | ICD-10-CM

## 2022-08-29 DIAGNOSIS — Z794 Long term (current) use of insulin: Secondary | ICD-10-CM

## 2022-08-29 DIAGNOSIS — N186 End stage renal disease: Secondary | ICD-10-CM | POA: Diagnosis not present

## 2022-09-02 ENCOUNTER — Encounter (INDEPENDENT_AMBULATORY_CARE_PROVIDER_SITE_OTHER): Payer: Self-pay

## 2022-09-02 ENCOUNTER — Encounter (INDEPENDENT_AMBULATORY_CARE_PROVIDER_SITE_OTHER): Payer: Self-pay | Admitting: Nurse Practitioner

## 2022-09-02 NOTE — Progress Notes (Signed)
Subjective:    Patient ID: Rachel Holmes, female    DOB: [birth year], 61 y.o.   MRN: [id number] No chief complaint on file.   Currently abdominal is a 61 year old female who returns today following recent intervention of her left upper extremity dialysis access.  The patient has a left brachiocephalic AV fistula.  The patient has had difficulty with fistula maturation.  Her recent fistulogram noted that the patient had a deep fistula which was also very tortuous in nature.  Based on this it was superficialization she will likely continue to have difficulties.  She continues to be maintained via PermCath.    Review of Systems  All other systems reviewed and are negative.      Objective:   Physical Exam Vitals reviewed.  HENT:     Head: Normocephalic.  Cardiovascular:     Rate and Rhythm: Normal rate.     Pulses:          Radial pulses are 2+ on the right side and 2+ on the left side.  Pulmonary:     Effort: Pulmonary effort is normal.  Musculoskeletal:     Left Lower Extremity: Left leg is amputated below knee.  Skin:    General: Skin is warm and dry.  Neurological:     Mental Status: She is alert and oriented to person, place, and time.     Gait: Gait abnormal.  Psychiatric:        Mood and Affect: Mood normal.        Behavior: Behavior normal.        Thought Content: Thought content normal.        Judgment: Judgment normal.     BP (!) 169/67 (BP Location: Right Arm)   Pulse 70   Resp 18   Past Medical History:  Diagnosis Date   Allergic rhinitis    Allergy    Anemia    Anxiety    Aortic stenosis 09/13/2016   a.) TTE 09/13/2016: mild AS --> MPG 11.2 mmHg. b.) TTE 12/27/2020: EF 55-60%; no AS --> MPG 8 mmHg.   Aortic valve endocarditis 12/25/2018   a.) TTE 12/25/2018: 0.75 x 1 cm AV mass. b.) TEE 12/28/2018: small shaggy mobile density on aortic side of RIGHT coronary cusp consistent with vegetation.   Arthritis    Back pain    Bradycardia    Breast mass     Patient can no longer palpate specific masses but showed tech general area of concern   Charcot's joint of ankle, left    CHF (congestive heart failure) ([place]) 07/09/2007   a.) TTE 07/09/2007: EF 50%; G1DD. b.) TTE 09/13/2016: EF 55%; G2DD. c.) TTE 12/27/2020: EF 55-60%; G2DD; GLS -15.1%.   CKD (chronic kidney disease), stage III (HCC)    Complication of anesthesia    a.) PONV. b.) Delayed emergence   Constipation    Diabetic nephropathy ([place])    Dyspnea    Family history of adverse reaction to anesthesia    a.) Sisters x 2 with (+) delayed emergence.   GERD (gastroesophageal reflux disease)    Heart murmur    HLD (hyperlipidemia)    Hyperparathyroidism ([place])    Hypertension    Joint pain    Leg edema    Legally blind in left eye, as defined in [place]    Lymphedema    Mild pulmonary hypertension ([place]) 09/13/2016   a.) TTE 09/13/2016: EF 55%; RVSP 45 mmHg. b.) TTE 12/25/2018: EF 55-60%; RVSP 35.5 mmHg.  Obesity    Onychomycosis    PONV (postoperative nausea and vomiting)    S/P BKA (below knee amputation) unilateral, left ([place])    Sepsis ([place]) 12/2018   a.) group G streptococcal bacteremia with AV endocarditis and LLE soft tissue infection.   T2DM (type 2 diabetes mellitus) ([place])     Social History   Socioeconomic History   Marital status: Divorced    Spouse name: Not on file   Number of children: 1   Years of education: Not on file   Highest education level: Not on file  Occupational History   Occupation: Glass blower/designer    Comment: disability  Tobacco Use   Smoking status: Never   Smokeless tobacco: Never  Vaping Use   Vaping Use: Never used  Substance and Sexual Activity   Alcohol use: No   Drug use: Never   Sexual activity: Not on file  Other Topics Concern   Not on file  Social History Narrative   ** Merged History Encounter **    on disability   Lives alone   Social Determinants of Health   Financial Resource Strain: Not on file  Food Insecurity:  Not on file  Transportation Needs: Not on file  Physical Activity: Not on file  Stress: Not on file  Social Connections: Not on file  Intimate Partner Violence: Not on file    Past Surgical History:  Procedure Laterality Date   A/V FISTULAGRAM Left 06/18/2022   Procedure: A/V Fistulagram;  Surgeon: Katha Cabal, MD;  Location: [facility];  Service: Cardiovascular;  Laterality: Left;   A/V FISTULAGRAM Left 08/13/2022   Procedure: A/V Fistulagram;  Surgeon: Katha Cabal, MD;  Location: [facility];  Service: Cardiovascular;  Laterality: Left;   ABDOMINAL HYSTERECTOMY     AMPUTATION Left 05/05/2019   Procedure: AMPUTATION BELOW KNEE;  Surgeon: Katha Cabal, MD;  Location: [facility];  Service: Vascular;  Laterality: Left;   AV FISTULA PLACEMENT Right 09/19/2021   Procedure: ARTERIOVENOUS (AV) FISTULA CREATION (BRACHIAL CEPHALIC);  Surgeon: Katha Cabal, MD;  Location: [facility];  Service: Vascular;  Laterality: Right;   AV FISTULA PLACEMENT Left 11/09/2021   Procedure: ARTERIOVENOUS (AV) FISTULA CREATION ( BRACHIAL CEPHALIC );  Surgeon: Katha Cabal, MD;  Location: [facility];  Service: Vascular;  Laterality: Left;   BREAST BIOPSY Left 2014   FNA 12:00 position - Negative   DIALYSIS/PERMA CATHETER INSERTION N/A 03/28/2022   Procedure: DIALYSIS/PERMA CATHETER INSERTION;  Surgeon: Algernon Huxley, MD;  Location: [facility];  Service: Cardiovascular;  Laterality: N/A;   EYE SURGERY Left 2007   removed a lens, no lens implanted   IR FLUORO GUIDE CV LINE RIGHT  12/29/2018   IR REMOVAL TUN CV CATH W/O FL  02/19/2019   IR US GUIDE VASC ACCESS RIGHT  12/29/2018   TEE WITHOUT CARDIOVERSION N/A 12/28/2018   Procedure: TRANSESOPHAGEAL ECHOCARDIOGRAM (TEE);  Surgeon: Sueanne Margarita, MD;  Location: [facility];  Service: Cardiovascular;  Laterality: N/A;    Family History  Problem Relation Age of Onset   Breast cancer Sister 21   Diabetes Sister     Diabetes Mother    Hypertension Mother    Hyperlipidemia Mother    Eating disorder Mother    Obesity Mother     Allergies  Allergen Reactions   Nifedipine Rash   Penicillins Hives, Shortness Of Breath and Swelling   Statins Shortness Of Breath    Wheezing  Hydralazine Itching   Hydrocodone Itching and Nausea Only   Oxycodone Nausea Only   Codeine Nausea Only   Ibuprofen Other (See Comments)    Raises blood pressure       Latest Ref Rng & Units 11/09/2021    6:48 AM 10/31/2021    1:51 PM 10/15/2021    1:57 PM  CBC  WBC 4.0 - 10.5 K/uL  6.9  7.7   Hemoglobin 12.0 - 15.0 g/dL 10.2  9.3  10.6   Hematocrit 36.0 - 46.0 % 30.0  28.7  33.8   Platelets 150 - 400 K/uL  219  192       CMP     Component Value Date/Time   NA 134 (L) 11/09/2021 0648   NA 139 06/25/2018 1050   NA 137 11/21/2013 0627   K 4.3 11/09/2021 0648   K 4.6 11/21/2013 0627   CL 98 11/09/2021 0648   CL 107 11/21/2013 0627   CO2 25 10/31/2021 1351   CO2 24 11/21/2013 0627   GLUCOSE 237 (H) 11/09/2021 0648   GLUCOSE 95 11/21/2013 0627   BUN 76 (H) 11/09/2021 0648   BUN 43 (H) 06/25/2018 1050   BUN 34 (H) 11/21/2013 0627   CREATININE 4.60 (H) 11/09/2021 0648   CREATININE 1.80 (H) 09/09/2014 1220   CALCIUM 8.7 (L) 10/31/2021 1351   CALCIUM 8.8 09/09/2014 1220   PROT 8.2 (H) 10/15/2021 1357   PROT 6.4 06/25/2018 1050   PROT 7.9 11/19/2013 0934   ALBUMIN 4.1 10/15/2021 1357   ALBUMIN 3.5 06/25/2018 1050   ALBUMIN 3.2 (L) 11/19/2013 0934   AST 23 10/15/2021 1357   AST 19 11/19/2013 0934   ALT 16 10/15/2021 1357   ALT 20 11/19/2013 0934   ALKPHOS 112 10/15/2021 1357   ALKPHOS 95 11/19/2013 0934   BILITOT 0.5 10/15/2021 1357   BILITOT 0.3 06/25/2018 1050   BILITOT 0.3 11/19/2013 0934   GFRNONAA 14 (L) 10/31/2021 1351   GFRNONAA 31 (L) 09/09/2014 1220   GFRNONAA 26 (L) 11/21/2013 0627   GFRAA 17 (L) 05/28/2019 2252   GFRAA 38 (L) 09/09/2014 1220   GFRAA 30 (L) 11/21/2013 0627     No  results found.     Assessment & Plan:   1. ESRD (end stage renal disease) ([place]) Based on the patient's recent noninvasive studies she is noted to have a very deep fistula with a tortuous fistula as well.  Based on this even if we were to superficial lysed the access, it still may have difficulty with cannulation.  Based on the recent intervention, it would be in the patient's best interest to move forward with jump graft revision using Artegraft.  I discussed the risk, benefits and alternatives the patient she is agreeable to proceed.  2. Essential hypertension Continue antihypertensive medications as already ordered, these medications have been reviewed and there are no changes at this time.   3. Type 2 diabetes mellitus with diabetic peripheral angiopathy without gangrene, with long-term current use of insulin ([place]) Continue hypoglycemic medications as already ordered, these medications have been reviewed and there are no changes at this time.  Hgb A1C to be monitored as already arranged by primary service    Current Outpatient Medications on File Prior to Visit  Medication Sig Dispense Refill   acetaminophen (TYLENOL) 500 MG tablet Take 1,000 mg by mouth every 6 (six) hours as needed.     albuterol (PROVENTIL HFA;VENTOLIN HFA) 108 (90 Base) MCG/ACT inhaler Inhale 1-2 puffs into  the lungs every 6 (six) hours as needed for wheezing or shortness of breath.     aspirin EC 81 MG EC tablet Take 1 tablet (81 mg total) by mouth daily.     Blood Glucose Monitoring Suppl (ONE TOUCH ULTRA 2) w/Device KIT 1 Device by Does not apply route daily. 1 each 0   calcitRIOL (ROCALTROL) 0.25 MCG capsule Take 0.25 mcg by mouth daily.     cetirizine (ZYRTEC) 10 MG tablet Take 10 mg by mouth at bedtime.     clobetasol cream (TEMOVATE) 0.09 % Apply 1 application topically 2 (two) times daily as needed (rash).     cloNIDine (CATAPRES) 0.3 MG tablet Take 1 tablet (0.3 mg total) by mouth 2 (two) times daily. 180  tablet 3   Continuous Blood Gluc Receiver (FREESTYLE LIBRE 14 DAY READER) DEVI USE AS DIRECTED 1 each 0   Continuous Blood Gluc Sensor (FREESTYLE LIBRE 14 DAY SENSOR) MISC USE DEVICE FOR 14 DAYS AS DIRECTED 6 each 0   docusate sodium (COLACE) 100 MG capsule Take 1 capsule (100 mg total) by mouth 2 (two) times daily. 10 capsule 0   doxazosin (CARDURA) 4 MG tablet Take 1 tablet (4 mg total) by mouth at bedtime. 90 tablet 3   DULoxetine (CYMBALTA) 30 MG capsule Take 30 mg by mouth at bedtime.     ezetimibe (ZETIA) 10 MG tablet Take 1 tablet (10 mg total) by mouth daily. (Patient taking differently: Take 10 mg by mouth at bedtime.) 90 tablet 3   gabapentin (NEURONTIN) 600 MG tablet Take 300 mg by mouth at bedtime.     gabapentin (NEURONTIN) 600 MG tablet Take 300 mg by mouth daily.     glucosamine-chondroitin 500-400 MG tablet Take 1 tablet by mouth in the morning and at bedtime.     insulin lispro (HUMALOG KWIKPEN) 100 UNIT/ML KwikPen 3 times a day (just before each meal) 16-18-19 units, and pen needles 3 day. (Patient taking differently: Inject 25 Units into the skin 3 (three) times daily with meals.)     Insulin Pen Needle (BD PEN NEEDLE MICRO U/F) 32G X 6 MM MISC 1 each by Does not apply route 3 (three) times daily. 100 each 3   isosorbide mononitrate (IMDUR) 120 MG 24 hr tablet Take 1 tablet (120 mg total) by mouth daily. (Patient taking differently: Take 120 mg by mouth in the morning and at bedtime.) 180 tablet 3   lactulose (CHRONULAC) 10 GM/15ML solution Take 15 mLs by mouth daily as needed.     Lancets (ONETOUCH ULTRASOFT) lancets Used to check blood sugars four times daily. 200 each 12   Multiple Vitamin (MULTIVITAMIN WITH MINERALS) TABS tablet Take 1 tablet by mouth daily.     olmesartan (BENICAR) 40 MG tablet Take 40 mg by mouth at bedtime.     omeprazole (PRILOSEC) 40 MG capsule Take 40 mg by mouth 2 (two) times daily with breakfast and lunch.     ondansetron (ZOFRAN ODT) 4 MG  disintegrating tablet Take 1 tablet (4 mg total) by mouth every 8 (eight) hours as needed for nausea or vomiting. 20 tablet 0   ONETOUCH VERIO test strip USE TO CHECK BLOOD SUGAR TWICE DAILY 100 each 0   silver sulfADIAZINE (SILVADENE) 1 % cream Apply 1 application topically daily. 50 g 0   torsemide (DEMADEX) 100 MG tablet Take 100 mg by mouth every morning.     traMADol (ULTRAM) 50 MG tablet Take 1 tablet (50 mg total) by mouth every  6 (six) hours as needed for moderate pain or severe pain. 36 tablet 0   TRULICITY 4.5 KM/6.2MM SOPN Inhale 4.5 mg into the lungs every 7 (seven) days.     vitamin B-12 (CYANOCOBALAMIN) 1000 MCG tablet Take 1,000 mcg by mouth daily.     methocarbamol (ROBAXIN) 750 MG tablet Take 1 tablet (750 mg total) by mouth every 12 (twelve) hours as needed for muscle spasms. 60 tablet 2   No current facility-administered medications on file prior to visit.    There are no Patient Instructions on file for this visit. No follow-ups on file.   Kris Hartmann, NP

## 2022-09-06 ENCOUNTER — Telehealth (INDEPENDENT_AMBULATORY_CARE_PROVIDER_SITE_OTHER): Payer: Self-pay

## 2022-09-06 NOTE — Telephone Encounter (Signed)
Spoke with the patient to get her scheduled for her left  AVF revision and Superficialization with Dr. Delana Meyer next week.

## 2022-09-24 ENCOUNTER — Telehealth (INDEPENDENT_AMBULATORY_CARE_PROVIDER_SITE_OTHER): Payer: Self-pay

## 2022-09-24 NOTE — Telephone Encounter (Signed)
Spoke with the patient and she is scheduled with Dr. Delana Meyer for a left AVF revision and Superficialization on 10/16/22 at [facility]. Pre-op phone call is on 10/08/22 between 1-5 pm. Pre-surgical instructions were discussed and will be mailed.

## 2022-10-07 ENCOUNTER — Other Ambulatory Visit (INDEPENDENT_AMBULATORY_CARE_PROVIDER_SITE_OTHER): Payer: Self-pay | Admitting: Nurse Practitioner

## 2022-10-07 DIAGNOSIS — N186 End stage renal disease: Secondary | ICD-10-CM

## 2022-10-08 ENCOUNTER — Encounter
Admission: RE | Admit: 2022-10-08 | Discharge: 2022-10-08 | Disposition: A | Discharge status: Home / Self Care | Payer: Medicare Other | Source: Ambulatory Visit | Attending: Vascular Surgery | Admitting: Vascular Surgery

## 2022-10-08 ENCOUNTER — Other Ambulatory Visit: Payer: Self-pay

## 2022-10-08 VITALS — Ht 68.0 in | Wt 300.0 lb

## 2022-10-08 DIAGNOSIS — E1151 Type 2 diabetes mellitus with diabetic peripheral angiopathy without gangrene: Secondary | ICD-10-CM

## 2022-10-08 DIAGNOSIS — N186 End stage renal disease: Secondary | ICD-10-CM

## 2022-10-08 DIAGNOSIS — D638 Anemia in other chronic diseases classified elsewhere: Secondary | ICD-10-CM

## 2022-10-08 DIAGNOSIS — B955 Unspecified streptococcus as the cause of diseases classified elsewhere: Secondary | ICD-10-CM

## 2022-10-08 DIAGNOSIS — E8722 Chronic metabolic acidosis: Secondary | ICD-10-CM

## 2022-10-08 HISTORY — DX: Acute embolism and thrombosis of unspecified deep veins of unspecified lower extremity: I82.409

## 2022-10-08 NOTE — Patient Instructions (Signed)
Your procedure is scheduled on: 10/16/22 Report to [facility]. To find out your arrival time please call [phone] between 1PM - 3PM on 10/15/22.  Remember: Instructions that are not followed completely may result in serious medical risk, up to and including death, or upon the discretion of your surgeon and anesthesiologist your surgery may need to be rescheduled.     _X__ 1. Do not eat food or drink any liquids after midnight the night before your procedure.                 No gum chewing or hard candies.   __X__2.  On the morning of surgery brush your teeth with toothpaste and water, you                 may rinse your mouth with mouthwash if you wish.  Do not swallow any              toothpaste of mouthwash.     _X__ 3.  No Alcohol for 24 hours before or after surgery.   _X__ 4.  Do Not Smoke or use e-cigarettes For 24 Hours Prior to Your Surgery.                 Do not use any chewable tobacco products for at least 6 hours prior to                 surgery.  ____  5.  Bring all medications with you on the day of surgery if instructed.   __X__  6.  Notify your doctor if there is any change in your medical condition      (cold, fever, infections).     Do not wear jewelry, make-up, hairpins, clips or nail polish. Do not wear lotions, powders, or perfumes. No deodorant Do not shave body hair 48 hours prior to surgery. Men may shave face and neck. Do not bring valuables to the hospital.    [facility] is not responsible for any belongings or valuables.  Contacts, dentures/partials or body piercings may not be worn into surgery. Bring a case for your contacts, glasses or hearing aids, a denture cup will be supplied. Leave your suitcase in the car. After surgery it may be brought to your room. For patients admitted to the hospital, discharge time is determined by your treatment team.   Patients discharged the day of surgery will  not be allowed to drive home.    __X__ Take these medicines the morning of surgery with A SIP OF WATER:    1. cloNIDine (CATAPRES) 0.3 MG tablet   2. isosorbide mononitrate (IMDUR) 120 MG 24 hr tablet   3. omeprazole (PRILOSEC) 40 MG capsule   4.  5.  6.  ____ Fleet Enema (as directed)   __X__ Use CHG Soap/SAGE wipes as directed  __X__ Use inhalers on the day of surgery  ____ Stop metformin/Janumet/Farxiga 2 days prior to surgery    ____ Take 1/2 of usual insulin dose the night before surgery. No insulin the morning          of surgery.   ____ Stop Blood Thinners Coumadin/Plavix/Xarelto/Pleta/Pradaxa/Eliquis/Effient/  on   Or contact your Surgeon, Cardiologist or Medical Doctor regarding  ability to stop your blood thinners  __X__ Stop Anti-inflammatories 7 days before surgery such as Advil, Ibuprofen, Motrin,  BC or Goodies Powder, Naprosyn, Naproxen, Aleve    __X__ Stop all herbals and  supplements, fish oil or vitamins  until after surgery.    ____ Bring C-Pap to the hospital.    Skip your Sunday 02/33/43 dose of Trulicity. Can restart the following Sunday.

## 2022-10-10 ENCOUNTER — Encounter: Payer: Self-pay | Admitting: Urgent Care

## 2022-10-10 ENCOUNTER — Encounter
Admission: RE | Admit: 2022-10-10 | Discharge: 2022-10-10 | Disposition: A | Discharge status: Home / Self Care | Payer: Medicare Other | Source: Ambulatory Visit | Attending: Vascular Surgery | Admitting: Vascular Surgery

## 2022-10-10 DIAGNOSIS — Z01818 Encounter for other preprocedural examination: Secondary | ICD-10-CM | POA: Insufficient documentation

## 2022-10-10 DIAGNOSIS — N186 End stage renal disease: Secondary | ICD-10-CM | POA: Diagnosis not present

## 2022-10-10 DIAGNOSIS — R7881 Bacteremia: Secondary | ICD-10-CM | POA: Insufficient documentation

## 2022-10-10 DIAGNOSIS — Z794 Long term (current) use of insulin: Secondary | ICD-10-CM | POA: Insufficient documentation

## 2022-10-10 DIAGNOSIS — I491 Atrial premature depolarization: Secondary | ICD-10-CM | POA: Diagnosis not present

## 2022-10-10 DIAGNOSIS — B955 Unspecified streptococcus as the cause of diseases classified elsewhere: Secondary | ICD-10-CM | POA: Diagnosis not present

## 2022-10-10 DIAGNOSIS — Z79899 Other long term (current) drug therapy: Secondary | ICD-10-CM

## 2022-10-10 DIAGNOSIS — E1151 Type 2 diabetes mellitus with diabetic peripheral angiopathy without gangrene: Secondary | ICD-10-CM | POA: Insufficient documentation

## 2022-10-10 DIAGNOSIS — D638 Anemia in other chronic diseases classified elsewhere: Secondary | ICD-10-CM | POA: Diagnosis not present

## 2022-10-10 DIAGNOSIS — E8722 Chronic metabolic acidosis: Secondary | ICD-10-CM | POA: Insufficient documentation

## 2022-10-10 DIAGNOSIS — Z01812 Encounter for preprocedural laboratory examination: Secondary | ICD-10-CM

## 2022-10-10 DIAGNOSIS — T502X5A Adverse effect of carbonic-anhydrase inhibitors, benzothiadiazides and other diuretics, initial encounter: Secondary | ICD-10-CM

## 2022-10-10 LAB — CBC
HCT: 38.3 % (ref 36.0–46.0)
Hemoglobin: 11.9 g/dL — ABNORMAL LOW (ref 12.0–15.0)
MCH: 28.1 pg (ref 26.0–34.0)
MCHC: 31.1 g/dL (ref 30.0–36.0)
MCV: 90.3 fL (ref 80.0–100.0)
Platelets: 167 10*3/uL (ref 150–400)
RBC: 4.24 MIL/uL (ref 3.87–5.11)
RDW: 14.9 % (ref 11.5–15.5)
WBC: 6.4 10*3/uL (ref 4.0–10.5)
nRBC: 0 % (ref 0.0–0.2)

## 2022-10-10 LAB — BASIC METABOLIC PANEL
Anion gap: 8 (ref 5–15)
BUN: 31 mg/dL — ABNORMAL HIGH (ref 8–23)
CO2: 23 mmol/L (ref 22–32)
Calcium: 8.6 mg/dL — ABNORMAL LOW (ref 8.9–10.3)
Chloride: 101 mmol/L (ref 98–111)
Creatinine, Ser: 4.01 mg/dL — ABNORMAL HIGH (ref 0.44–1.00)
GFR, Estimated: 12 mL/min — ABNORMAL LOW (ref >60)
Glucose, Bld: 288 mg/dL — ABNORMAL HIGH (ref 70–99)
Potassium: 4.1 mmol/L (ref 3.5–5.1)
Sodium: 132 mmol/L — ABNORMAL LOW (ref 135–145)

## 2022-10-10 LAB — TYPE AND SCREEN
ABO/RH(D): A POS
Antibody Screen: NEGATIVE

## 2022-10-10 NOTE — Progress Notes (Signed)
  Perioperative Services Pre-Admission/Anesthesia Testing    Date: 10/10/22  Name: Rachel Holmes MRN:   [id number]  Re: Lab review  Planned Surgical Procedure(s):    Case: [id number] Date/Time: 10/16/22 1206   Procedure: REVISON OF ARTERIOVENOUS FISTULA (Left)   Anesthesia type: General   Pre-op diagnosis: ESRD   Location: [facility] OR ROOM 08 / [facility]   Surgeons: Katha Cabal, MD   Clinical Notes:  Patient is scheduled for the above procedure on 10/16/2022 with Dr. Delana Meyer, MD. Lab results reviewed and patient sent a message in [place]. Upon further review, K+ was normal. Patient contacted via telephone to make her aware to disregard previous MyChart message.  Apologized for error any any inconvenience.   Honor Loh, MSN, APRN, FNP-C, CEN [facility]  Peri-operative Services Nurse Practitioner Phone: [phone] 10/10/22 1:49 PM  NOTE: This note has been prepared using Dragon dictation software. Despite my best ability to proofread, there is always the potential that unintentional transcriptional errors may still occur from this process.

## 2022-10-16 ENCOUNTER — Ambulatory Visit: Payer: Medicare Other | Admitting: Anesthesiology

## 2022-10-16 ENCOUNTER — Ambulatory Visit
Admission: RE | Admit: 2022-10-16 | Discharge: 2022-10-16 | Disposition: A | Discharge status: Home / Self Care | Payer: Medicare Other | Attending: Vascular Surgery | Admitting: Vascular Surgery

## 2022-10-16 ENCOUNTER — Other Ambulatory Visit: Payer: Self-pay

## 2022-10-16 ENCOUNTER — Ambulatory Visit: Payer: Medicare Other | Admitting: Urgent Care

## 2022-10-16 ENCOUNTER — Encounter: Payer: Self-pay | Admitting: Vascular Surgery

## 2022-10-16 ENCOUNTER — Encounter
Admission: RE | Disposition: A | Discharge status: Home / Self Care | Payer: Self-pay | Source: Home / Self Care | Attending: Vascular Surgery

## 2022-10-16 DIAGNOSIS — I890 Lymphedema, not elsewhere classified: Secondary | ICD-10-CM | POA: Insufficient documentation

## 2022-10-16 DIAGNOSIS — Z992 Dependence on renal dialysis: Secondary | ICD-10-CM

## 2022-10-16 DIAGNOSIS — I509 Heart failure, unspecified: Secondary | ICD-10-CM | POA: Insufficient documentation

## 2022-10-16 DIAGNOSIS — D631 Anemia in chronic kidney disease: Secondary | ICD-10-CM | POA: Diagnosis not present

## 2022-10-16 DIAGNOSIS — T82898A Other specified complication of vascular prosthetic devices, implants and grafts, initial encounter: Secondary | ICD-10-CM

## 2022-10-16 DIAGNOSIS — I130 Hypertensive heart and chronic kidney disease with heart failure and stage 1 through stage 4 chronic kidney disease, or unspecified chronic kidney disease: Secondary | ICD-10-CM | POA: Insufficient documentation

## 2022-10-16 DIAGNOSIS — K219 Gastro-esophageal reflux disease without esophagitis: Secondary | ICD-10-CM | POA: Insufficient documentation

## 2022-10-16 DIAGNOSIS — Z794 Long term (current) use of insulin: Secondary | ICD-10-CM

## 2022-10-16 DIAGNOSIS — F419 Anxiety disorder, unspecified: Secondary | ICD-10-CM | POA: Insufficient documentation

## 2022-10-16 DIAGNOSIS — N186 End stage renal disease: Secondary | ICD-10-CM

## 2022-10-16 DIAGNOSIS — N183 Chronic kidney disease, stage 3 unspecified: Secondary | ICD-10-CM | POA: Insufficient documentation

## 2022-10-16 DIAGNOSIS — R001 Bradycardia, unspecified: Secondary | ICD-10-CM | POA: Insufficient documentation

## 2022-10-16 DIAGNOSIS — E1151 Type 2 diabetes mellitus with diabetic peripheral angiopathy without gangrene: Secondary | ICD-10-CM | POA: Insufficient documentation

## 2022-10-16 DIAGNOSIS — E1122 Type 2 diabetes mellitus with diabetic chronic kidney disease: Secondary | ICD-10-CM | POA: Insufficient documentation

## 2022-10-16 DIAGNOSIS — I700 Atherosclerosis of aorta: Secondary | ICD-10-CM | POA: Diagnosis not present

## 2022-10-16 HISTORY — PX: REVISON OF ARTERIOVENOUS FISTULA: SHX6074

## 2022-10-16 LAB — POCT I-STAT, CHEM 8
BUN: 40 mg/dL — ABNORMAL HIGH (ref 8–23)
Calcium, Ion: 1.1 mmol/L — ABNORMAL LOW (ref 1.15–1.40)
Chloride: 102 mmol/L (ref 98–111)
Creatinine, Ser: 4.7 mg/dL — ABNORMAL HIGH (ref 0.44–1.00)
Glucose, Bld: 209 mg/dL — ABNORMAL HIGH (ref 70–99)
HCT: 35 % — ABNORMAL LOW (ref 36.0–46.0)
Hemoglobin: 11.9 g/dL — ABNORMAL LOW (ref 12.0–15.0)
Potassium: 5.1 mmol/L (ref 3.5–5.1)
Sodium: 137 mmol/L (ref 135–145)
TCO2: 28 mmol/L (ref 22–32)

## 2022-10-16 LAB — GLUCOSE, CAPILLARY: Glucose-Capillary: 197 mg/dL — ABNORMAL HIGH (ref 70–99)

## 2022-10-16 SURGERY — REVISON OF ARTERIOVENOUS FISTULA
Anesthesia: General | Laterality: Left

## 2022-10-16 MED ORDER — VANCOMYCIN HCL 1500 MG/300ML IV SOLN
1500.0000 mg | INTRAVENOUS | Status: AC
  Administered 2022-10-16: 1500 mg via INTRAVENOUS
  Filled 2022-10-16 (×2): qty 300

## 2022-10-16 MED ORDER — MIDAZOLAM HCL 2 MG/2ML IJ SOLN
INTRAMUSCULAR | Status: AC
  Filled 2022-10-16: qty 2

## 2022-10-16 MED ORDER — CHLORHEXIDINE GLUCONATE CLOTH 2 % EX PADS: 6.0000 | MEDICATED_PAD | Freq: Once | CUTANEOUS | Status: DC

## 2022-10-16 MED ORDER — CHLORHEXIDINE GLUCONATE 0.12 % MT SOLN
15.0000 mL | Freq: Once | OROMUCOSAL | Status: AC
  Administered 2022-10-16: 15 mL via OROMUCOSAL

## 2022-10-16 MED ORDER — FENTANYL CITRATE (PF) 100 MCG/2ML IJ SOLN: 25.0000 ug | INTRAMUSCULAR | Status: DC | PRN

## 2022-10-16 MED ORDER — DEXAMETHASONE SODIUM PHOSPHATE 10 MG/ML IJ SOLN
INTRAMUSCULAR | Status: DC | PRN
  Administered 2022-10-16: 10 mg via INTRAVENOUS

## 2022-10-16 MED ORDER — FENTANYL CITRATE (PF) 100 MCG/2ML IJ SOLN
INTRAMUSCULAR | Status: AC
  Filled 2022-10-16: qty 2

## 2022-10-16 MED ORDER — ROCURONIUM BROMIDE 100 MG/10ML IV SOLN
INTRAVENOUS | Status: DC | PRN
  Administered 2022-10-16: 30 mg via INTRAVENOUS
  Administered 2022-10-16: 20 mg via INTRAVENOUS
  Administered 2022-10-16: 40 mg via INTRAVENOUS

## 2022-10-16 MED ORDER — SUGAMMADEX SODIUM 200 MG/2ML IV SOLN
INTRAVENOUS | Status: DC | PRN
  Administered 2022-10-16: 200 mg via INTRAVENOUS

## 2022-10-16 MED ORDER — ACETAMINOPHEN 10 MG/ML IV SOLN: 1000.0000 mg | Freq: Once | INTRAVENOUS | Status: DC | PRN

## 2022-10-16 MED ORDER — HEMOSTATIC AGENTS (NO CHARGE) OPTIME
TOPICAL | Status: DC | PRN
  Administered 2022-10-16: 1 via TOPICAL

## 2022-10-16 MED ORDER — ORAL CARE MOUTH RINSE: 15.0000 mL | Freq: Once | OROMUCOSAL | Status: AC

## 2022-10-16 MED ORDER — TRAMADOL HCL 50 MG PO TABS: 50.0000 mg | ORAL_TABLET | Freq: Four times a day (QID) | ORAL | 0 refills | Status: AC | PRN

## 2022-10-16 MED ORDER — HEPARIN SODIUM (PORCINE) 5000 UNIT/ML IJ SOLN
INTRAMUSCULAR | Status: AC
  Filled 2022-10-16: qty 1

## 2022-10-16 MED ORDER — BUPIVACAINE HCL (PF) 0.5 % IJ SOLN
INTRAMUSCULAR | Status: AC
  Filled 2022-10-16: qty 30

## 2022-10-16 MED ORDER — SODIUM CHLORIDE 0.9 % IV SOLN
INTRAVENOUS | Status: DC | PRN
  Administered 2022-10-16: 501 mL

## 2022-10-16 MED ORDER — LIDOCAINE HCL (CARDIAC) PF 100 MG/5ML IV SOSY
PREFILLED_SYRINGE | INTRAVENOUS | Status: DC | PRN
  Administered 2022-10-16: 100 mg via INTRAVENOUS

## 2022-10-16 MED ORDER — ONDANSETRON HCL 4 MG/2ML IJ SOLN: 4.0000 mg | Freq: Once | INTRAMUSCULAR | Status: DC | PRN

## 2022-10-16 MED ORDER — ONDANSETRON HCL 4 MG/2ML IJ SOLN
INTRAMUSCULAR | Status: DC | PRN
  Administered 2022-10-16: 4 mg via INTRAVENOUS

## 2022-10-16 MED ORDER — SODIUM CHLORIDE 0.9 % IV SOLN: INTRAVENOUS | Status: DC

## 2022-10-16 MED ORDER — FENTANYL CITRATE (PF) 100 MCG/2ML IJ SOLN
INTRAMUSCULAR | Status: DC | PRN
  Administered 2022-10-16 (×2): 50 ug via INTRAVENOUS

## 2022-10-16 MED ORDER — BUPIVACAINE LIPOSOME 1.3 % IJ SUSP
INTRAMUSCULAR | Status: AC
  Filled 2022-10-16: qty 20

## 2022-10-16 MED ORDER — CHLORHEXIDINE GLUCONATE 0.12 % MT SOLN
OROMUCOSAL | Status: AC
  Filled 2022-10-16: qty 15

## 2022-10-16 MED ORDER — CHLORHEXIDINE GLUCONATE CLOTH 2 % EX PADS
6.0000 | MEDICATED_PAD | Freq: Once | CUTANEOUS | Status: AC
  Administered 2022-10-16: 6 via TOPICAL

## 2022-10-16 MED ORDER — GLYCOPYRROLATE 0.2 MG/ML IJ SOLN
INTRAMUSCULAR | Status: DC | PRN
  Administered 2022-10-16: .2 mg via INTRAVENOUS

## 2022-10-16 MED ORDER — PROPOFOL 10 MG/ML IV BOLUS
INTRAVENOUS | Status: DC | PRN
  Administered 2022-10-16: 120 mg via INTRAVENOUS

## 2022-10-16 MED ORDER — BUPIVACAINE LIPOSOME 1.3 % IJ SUSP
INTRAMUSCULAR | Status: DC | PRN
  Administered 2022-10-16: 50 mL via INTRAMUSCULAR

## 2022-10-16 SURGICAL SUPPLY — 65 items
APPLIER CLIP 11 MED OPEN (CLIP)
APPLIER CLIP 9.375 SM OPEN (CLIP)
BAG DECANTER FOR FLEXI CONT (MISCELLANEOUS) ×1 IMPLANT
BLADE SURG 15 STRL LF DISP TIS (BLADE) ×1 IMPLANT
BLADE SURG 15 STRL SS (BLADE) ×1
BLADE SURG SZ11 CARB STEEL (BLADE) ×1 IMPLANT
BOOT SUTURE AID YELLOW STND (SUTURE) ×1 IMPLANT
BRUSH SCRUB EZ  4% CHG (MISCELLANEOUS) ×1
BRUSH SCRUB EZ 4% CHG (MISCELLANEOUS) ×1 IMPLANT
CHLORAPREP W/TINT 26 (MISCELLANEOUS) ×1 IMPLANT
CLIP APPLIE 11 MED OPEN (CLIP) IMPLANT
CLIP APPLIE 9.375 SM OPEN (CLIP) IMPLANT
DERMABOND ADVANCED .7 DNX12 (GAUZE/BANDAGES/DRESSINGS) IMPLANT
DRESSING SURGICEL FIBRLLR 1X2 (HEMOSTASIS) ×1 IMPLANT
DRSG SURGICEL FIBRILLAR 1X2 (HEMOSTASIS) ×1
ELECT CAUTERY BLADE 6.4 (BLADE) ×1 IMPLANT
ELECT REM PT RETURN 9FT ADLT (ELECTROSURGICAL) ×1
ELECTRODE REM PT RTRN 9FT ADLT (ELECTROSURGICAL) ×1 IMPLANT
GEL ULTRASOUND 20GR AQUASONIC (MISCELLANEOUS) ×1 IMPLANT
GLOVE BIO SURGEON STRL SZ7 (GLOVE) ×1 IMPLANT
GLOVE SURG SYN 7.0 (GLOVE) ×1 IMPLANT
GLOVE SURG SYN 7.0 PF PI (GLOVE) ×1 IMPLANT
GLOVE SURG SYN 8.0 (GLOVE) ×1 IMPLANT
GLOVE SURG SYN 8.0 PF PI (GLOVE) ×1 IMPLANT
GOWN STRL REUS W/ TWL LRG LVL3 (GOWN DISPOSABLE) ×3 IMPLANT
GOWN STRL REUS W/ TWL XL LVL3 (GOWN DISPOSABLE) ×1 IMPLANT
GOWN STRL REUS W/TWL LRG LVL3 (GOWN DISPOSABLE) ×3
GOWN STRL REUS W/TWL XL LVL3 (GOWN DISPOSABLE) ×1
GRAFT COLLAGEN VASCULAR 8X45 (Miscellaneous) IMPLANT
IV NS 100ML SINGLE PACK (IV SOLUTION) IMPLANT
IV NS 500ML (IV SOLUTION) ×1
IV NS 500ML BAXH (IV SOLUTION) ×1 IMPLANT
KIT TURNOVER KIT A (KITS) ×1 IMPLANT
LABEL OR SOLS (LABEL) ×1 IMPLANT
LOOP RED MAXI  1X406MM (MISCELLANEOUS) ×1
LOOP VESSEL MAXI  1X406 RED (MISCELLANEOUS) ×1
LOOP VESSEL MAXI 1X406 RED (MISCELLANEOUS) ×1 IMPLANT
LOOP VESSEL MINI 0.8X406 BLUE (MISCELLANEOUS) ×2 IMPLANT
LOOPS BLUE MINI 0.8X406MM (MISCELLANEOUS) ×2
MANIFOLD NEPTUNE II (INSTRUMENTS) ×1 IMPLANT
NDL FILTER BLUNT 18X1 1/2 (NEEDLE) ×1 IMPLANT
NEEDLE FILTER BLUNT 18X1 1/2 (NEEDLE) ×1 IMPLANT
NS IRRIG 500ML POUR BTL (IV SOLUTION) ×1 IMPLANT
PACK EXTREMITY ARMC (MISCELLANEOUS) ×1 IMPLANT
PAD PREP 24X41 OB/GYN DISP (PERSONAL CARE ITEMS) ×1 IMPLANT
SPIKE FLUID TRANSFER (MISCELLANEOUS) ×1 IMPLANT
STOCKINETTE 48X4 2 PLY STRL (GAUZE/BANDAGES/DRESSINGS) ×1 IMPLANT
STOCKINETTE STRL 4IN 9604848 (GAUZE/BANDAGES/DRESSINGS) ×1 IMPLANT
SUT MNCRL+ 5-0 UNDYED PC-3 (SUTURE) ×1 IMPLANT
SUT MONOCRYL 5-0 (SUTURE) ×2
SUT PROLENE 6 0 BV (SUTURE) ×4 IMPLANT
SUT SILK 2 0 (SUTURE)
SUT SILK 2-0 18XBRD TIE 12 (SUTURE) ×1 IMPLANT
SUT SILK 3 0 (SUTURE)
SUT SILK 3-0 18XBRD TIE 12 (SUTURE) ×1 IMPLANT
SUT SILK 4 0 (SUTURE)
SUT SILK 4-0 18XBRD TIE 12 (SUTURE) ×1 IMPLANT
SUT VIC AB 3-0 SH 27 (SUTURE) ×1
SUT VIC AB 3-0 SH 27X BRD (SUTURE) ×2 IMPLANT
SYR 20ML LL LF (SYRINGE) ×1 IMPLANT
SYR 3ML LL SCALE MARK (SYRINGE) ×1 IMPLANT
SYR TOOMEY IRRIG 70ML (MISCELLANEOUS) ×1
SYRINGE TOOMEY IRRIG 70ML (MISCELLANEOUS) IMPLANT
TRAP FLUID SMOKE EVACUATOR (MISCELLANEOUS) ×1 IMPLANT
WATER STERILE IRR 500ML POUR (IV SOLUTION) ×1 IMPLANT

## 2022-10-16 NOTE — Discharge Instructions (Signed)
AMBULATORY SURGERY  ?DISCHARGE INSTRUCTIONS ? ? ?The drugs that you were given will stay in your system until tomorrow so for the next 24 hours you should not: ? ?Drive an automobile ?Make any legal decisions ?Drink any alcoholic beverage ? ? ?You may resume regular meals tomorrow.  Today it is better to start with liquids and gradually work up to solid foods. ? ?You may eat anything you prefer, but it is better to start with liquids, then soup and crackers, and gradually work up to solid foods. ? ? ?Please notify your doctor immediately if you have any unusual bleeding, trouble breathing, redness and pain at the surgery site, drainage, fever, or pain not relieved by medication. ? ? ? ?Additional Instructions: ? ? ? ?Please contact your physician with any problems or Same Day Surgery at [phone], Monday through Friday 6 am to 4 pm, or [place] at [place] Main number at [phone].  ?

## 2022-10-16 NOTE — Anesthesia Procedure Notes (Addendum)
Procedure Name: Intubation Date/Time: 10/16/2022 12:40 PM  Performed by: Nelda Marseille, CRNAPre-anesthesia Checklist: Patient identified, Patient being monitored, Timeout performed, Emergency Drugs available and Suction available Patient Re-evaluated:Patient Re-evaluated prior to induction Oxygen Delivery Method: Circle System Utilized Preoxygenation: Pre-oxygenation with 100% oxygen Induction Type: IV induction Ventilation: Mask ventilation without difficulty Laryngoscope Size: Mac and 3 Grade View: Grade II Tube type: Oral Tube size: 7.0 mm Number of attempts: 1 Airway Equipment and Method: Stylet Placement Confirmation: ETT inserted through vocal cords under direct vision, positive ETCO2 and breath sounds checked- equal and bilateral Secured at: 21 cm Tube secured with: Tape Dental Injury: Teeth and Oropharynx as per pre-operative assessment

## 2022-10-16 NOTE — Anesthesia Preprocedure Evaluation (Signed)
Anesthesia Evaluation  Patient identified by MRN, date of birth, ID band Patient awake    Reviewed: Allergy & Precautions, NPO status , Patient's Chart, lab work & pertinent test results  History of Anesthesia Complications (+) PONV, PROLONGED EMERGENCE and history of anesthetic complications  Airway Mallampati: III  TM Distance: >3 FB Neck ROM: Full    Dental  (+) Edentulous Upper, Edentulous Lower   Pulmonary neg pulmonary ROS, neg sleep apnea, neg COPD, Patient abstained from smoking.Not current smoker   Pulmonary exam normal breath sounds clear to auscultation       Cardiovascular Exercise Tolerance: Poor METShypertension, + Peripheral Vascular Disease (s/p left BKA) and +CHF  (-) CAD and (-) Past MI Normal cardiovascular exam(-) dysrhythmias + Valvular Problems/Murmurs (mild AS, mild pulm HTN)  Rhythm:Regular Rate:Normal  Bradycardia; LE lymphedema  ECG 10/31/21: Normal sinus rhythm with sinus arrhythmia Minimal voltage criteria for LVH, may be normal variant ( Sokolow-Lyon ) Borderline ECG When compared with ECG of 25-Apr-2021 15:13, No significant change was found  TTE 01/24/21:  1. Left ventricular ejection fraction, by estimation, is 55 to 60%. The left ventricle has normal function. The left ventricle has no regional wall motion abnormalities. Left ventricular diastolic parameters are consistent with Grade II diastolic dysfunction (pseudonormalization). The average left ventricular global longitudinal strain is -15.1 %. The global longitudinal strain is abnormal.  2. Right ventricular systolic function is normal. The right ventricular size is normal.  3. Left atrial size was mildly dilated.  4. The mitral valve is normal in structure. Mild mitral valve regurgitation.  5. There is a 7 x 5 mm calcification noted on the posterior aspect (non coronary cusp) of the aortic valve. This is more consistent with a calcific mass.  The aortic valve is calcified. Aortic valve regurgitation is not visualized. Mild to moderate aortic valve sclerosis/calcification is present, without any evidence of aortic stenosis.  6. The inferior vena cava is dilated in size with <50% respiratory variability, suggesting right atrial pressure of 15 mmHg.    Neuro/Psych  PSYCHIATRIC DISORDERS Anxiety     Left eye blindness    GI/Hepatic ,GERD  ,,(+)     (-) substance abuse    Endo/Other  diabetes, Type 2  Hyperparathyroidism; class 3 obesity Last taken GLP1 over a week ago  Renal/GU Dialysis and ESRFRenal disease (stage V CKD)Last dialyzed yesterday     Musculoskeletal   Abdominal  (+) + obese  Peds  Hematology  (+) Blood dyscrasia, anemia   Anesthesia Other Findings Past Medical History: No date: Allergic rhinitis No date: Allergy No date: Anemia No date: Anxiety 09/13/2016: Aortic stenosis     Comment:  a.) TTE 09/13/2016: mild AS --> MPG 11.2 mmHg. b.) TTE               12/27/2020: EF 55-60%; no AS --> MPG 8 mmHg. 12/25/2018: Aortic valve endocarditis     Comment:  a.) TTE 12/25/2018: 0.75 x 1 cm AV mass. b.) TEE               12/28/2018: small shaggy mobile density on aortic side of              RIGHT coronary cusp consistent with vegetation. No date: Arthritis No date: Back pain No date: Bradycardia No date: Breast mass     Comment:  Patient can no longer palpate specific masses but showed              tech general area of  concern No date: Charcot's joint of ankle, left 07/09/2007: CHF (congestive heart failure) ([place])     Comment:  a.) TTE 07/09/2007: EF 50%; G1DD. b.) TTE 09/13/2016: EF              55%; G2DD. c.) TTE 12/27/2020: EF 55-60%; G2DD; GLS               -15.1%. No date: CKD (chronic kidney disease), stage III (HCC) No date: Complication of anesthesia     Comment:  a.) PONV. b.) Delayed emergence No date: Constipation No date: Diabetic nephropathy (HCC) No date: DVT (deep venous thrombosis)  (HCC) No date: Dyspnea No date: Family history of adverse reaction to anesthesia     Comment:  a.) Sisters x 2 with (+) delayed emergence. No date: GERD (gastroesophageal reflux disease) No date: Heart murmur No date: HLD (hyperlipidemia) No date: Hyperparathyroidism ([place]) No date: Hypertension No date: Joint pain No date: Leg edema No date: Legally blind in left eye, as defined in [place] No date: Lymphedema 09/13/2016: Mild pulmonary hypertension ([place])     Comment:  a.) TTE 09/13/2016: EF 55%; RVSP 45 mmHg. b.) TTE               12/25/2018: EF 55-60%; RVSP 35.5 mmHg. No date: Obesity No date: Onychomycosis No date: PONV (postoperative nausea and vomiting) No date: S/P BKA (below knee amputation) unilateral, left ([place]) 12/2018: Sepsis ([place])     Comment:  a.) group G streptococcal bacteremia with AV               endocarditis and LLE soft tissue infection. No date: T2DM (type 2 diabetes mellitus) ([place])  Reproductive/Obstetrics                              Anesthesia Physical Anesthesia Plan  ASA: 4  Anesthesia Plan: General   Post-op Pain Management: Ofirmev IV (intra-op)*   Induction: Intravenous  PONV Risk Score and Plan: 4 or greater and Ondansetron, Treatment may vary due to age or medical condition and Dexamethasone  Airway Management Planned: Oral ETT and Video Laryngoscope Planned  Additional Equipment: None  Intra-op Plan:   Post-operative Plan: Extubation in OR  Informed Consent: I have reviewed the patients History and Physical, chart, labs and discussed the procedure including the risks, benefits and alternatives for the proposed anesthesia with the patient or authorized representative who has indicated his/her understanding and acceptance.     Dental advisory given  Plan Discussed with: CRNA  Anesthesia Plan Comments: (Discussed risks of anesthesia with patient, including PONV, sore throat, lip/dental/eye damage. Rare risks  discussed as well, such as cardiorespiratory and neurological sequelae, and allergic reactions. Discussed the role of CRNA in patient's perioperative care. Patient understands. Surgical site is high on arm into axilla, making nerve block a suboptimal option. Will proceed with GETA. Patient informed about increased incidence of above perioperative risk due to high BMI. Patient understands.  )         Anesthesia Quick Evaluation

## 2022-10-16 NOTE — Transfer of Care (Signed)
Immediate Anesthesia Transfer of Care Note  Patient: Rachel Holmes  Procedure(s) Performed: REVISON OF ARTERIOVENOUS FISTULA (Left)  Patient Location: PACU  Anesthesia Type:General  Level of Consciousness: awake and sedated  Airway & Oxygen Therapy: Patient Spontanous Breathing and Patient connected to face mask oxygen  Post-op Assessment: Report given to RN and Post -op Vital signs reviewed and stable  Post vital signs: Reviewed and stable  Last Vitals:  Vitals Value Taken Time  BP 176/72 10/16/22 1450  Temp    Pulse 82 10/16/22 1454  Resp 16 10/16/22 1454  SpO2 100 % 10/16/22 1454  Vitals shown include unvalidated device data.  Last Pain:  Vitals:   10/16/22 1445  TempSrc:   PainSc: 0-No pain         Complications: No notable events documented.

## 2022-10-16 NOTE — H&P (View-Only) (Signed)
MRN : [id number]  Rachel Holmes is a 61 y.o. (1961/03/01) female who presents with chief complaint of check access.  History of Present Illness:   Patient presents to [facility] for revision of her left arm AV fistula.  She was recently seen in the office and is a 61 year old female who returns today following recent intervention of her left upper extremity dialysis access. The patient has a left brachiocephalic AV fistula. The patient has had difficulty with fistula maturation. Her recent fistulogram noted that the patient had a deep fistula which was also very tortuous in nature. Based on this it was superficialization she will likely continue to have difficulties. She continues to be maintained via PermCath.   Current Meds  Medication Sig   acetaminophen (TYLENOL) 500 MG tablet Take 1,000 mg by mouth every 6 (six) hours as needed.   albuterol (PROVENTIL HFA;VENTOLIN HFA) 108 (90 Base) MCG/ACT inhaler Inhale 1-2 puffs into the lungs every 6 (six) hours as needed for wheezing or shortness of breath.   aspirin EC 81 MG EC tablet Take 1 tablet (81 mg total) by mouth daily.   Blood Glucose Monitoring Suppl (ONE TOUCH ULTRA 2) w/Device KIT 1 Device by Does not apply route daily.   calcitRIOL (ROCALTROL) 0.25 MCG capsule Take 0.25 mcg by mouth daily.   cetirizine (ZYRTEC) 10 MG tablet Take 10 mg by mouth at bedtime.   clobetasol cream (TEMOVATE) 5.37 % Apply 1 application topically 2 (two) times daily as needed (rash).   cloNIDine (CATAPRES) 0.3 MG tablet Take 1 tablet (0.3 mg total) by mouth 2 (two) times daily.   Continuous Blood Gluc Receiver (FREESTYLE LIBRE 14 DAY READER) DEVI USE AS DIRECTED   Continuous Blood Gluc Sensor (FREESTYLE LIBRE 14 DAY SENSOR) MISC USE DEVICE FOR 14 DAYS AS DIRECTED   docusate sodium (COLACE) 100 MG capsule Take 1 capsule (100 mg total) by mouth 2 (two) times daily.   doxazosin (CARDURA) 4 MG tablet Take 1 tablet (4 mg  total) by mouth at bedtime.   DULoxetine (CYMBALTA) 30 MG capsule Take 30 mg by mouth at bedtime.   ezetimibe (ZETIA) 10 MG tablet Take 1 tablet (10 mg total) by mouth daily. (Patient taking differently: Take 10 mg by mouth at bedtime.)   gabapentin (NEURONTIN) 600 MG tablet Take 600 mg by mouth at bedtime.   gabapentin (NEURONTIN) 600 MG tablet Take 300 mg by mouth daily.   glucosamine-chondroitin 500-400 MG tablet Take 1 tablet by mouth in the morning and at bedtime.   insulin lispro (HUMALOG KWIKPEN) 100 UNIT/ML KwikPen 3 times a day (just before each meal) 16-18-19 units, and pen needles 3 day. (Patient taking differently: Inject 24 Units into the skin 3 (three) times daily with meals.)   Insulin Pen Needle (BD PEN NEEDLE MICRO U/F) 32G X 6 MM MISC 1 each by Does not apply route 3 (three) times daily.   isosorbide mononitrate (IMDUR) 120 MG 24 hr tablet Take 1 tablet (120 mg total) by mouth daily. (Patient taking differently: Take 120 mg by mouth in the morning and at bedtime.)   lactulose (CHRONULAC) 10 GM/15ML solution Take 15 mLs by mouth daily as needed.   Lancets (ONETOUCH ULTRASOFT) lancets Used to check blood sugars four times daily.   Magnesium 400 MG CAPS Take 1 capsule by mouth at bedtime.   methocarbamol (ROBAXIN)  750 MG tablet Take 1 tablet (750 mg total) by mouth every 12 (twelve) hours as needed for muscle spasms.   Multiple Vitamin (MULTIVITAMIN WITH MINERALS) TABS tablet Take 1 tablet by mouth daily.   olmesartan (BENICAR) 40 MG tablet Take 40 mg by mouth at bedtime.   omeprazole (PRILOSEC) 40 MG capsule Take 40 mg by mouth 2 (two) times daily with breakfast and lunch.   ondansetron (ZOFRAN ODT) 4 MG disintegrating tablet Take 1 tablet (4 mg total) by mouth every 8 (eight) hours as needed for nausea or vomiting.   ONETOUCH VERIO test strip USE TO CHECK BLOOD SUGAR TWICE DAILY   torsemide (DEMADEX) 100 MG tablet Take 100 mg by mouth every morning.   traMADol (ULTRAM) 50 MG  tablet Take 1 tablet (50 mg total) by mouth every 6 (six) hours as needed for moderate pain or severe pain.   vitamin B-12 (CYANOCOBALAMIN) 1000 MCG tablet Take 1,000 mcg by mouth daily.    Past Medical History:  Diagnosis Date   Allergic rhinitis    Allergy    Anemia    Anxiety    Aortic stenosis 09/13/2016   a.) TTE 09/13/2016: mild AS --> MPG 11.2 mmHg. b.) TTE 12/27/2020: EF 55-60%; no AS --> MPG 8 mmHg.   Aortic valve endocarditis 12/25/2018   a.) TTE 12/25/2018: 0.75 x 1 cm AV mass. b.) TEE 12/28/2018: small shaggy mobile density on aortic side of RIGHT coronary cusp consistent with vegetation.   Arthritis    Back pain    Bradycardia    Breast mass    Patient can no longer palpate specific masses but showed tech general area of concern   Charcot's joint of ankle, left    CHF (congestive heart failure) ([place]) 07/09/2007   a.) TTE 07/09/2007: EF 50%; G1DD. b.) TTE 09/13/2016: EF 55%; G2DD. c.) TTE 12/27/2020: EF 55-60%; G2DD; GLS -15.1%.   CKD (chronic kidney disease), stage III (HCC)    Complication of anesthesia    a.) PONV. b.) Delayed emergence   Constipation    Diabetic nephropathy (HCC)    DVT (deep venous thrombosis) (HCC)    Dyspnea    Family history of adverse reaction to anesthesia    a.) Sisters x 2 with (+) delayed emergence.   GERD (gastroesophageal reflux disease)    Heart murmur    HLD (hyperlipidemia)    Hyperparathyroidism ([place])    Hypertension    Joint pain    Leg edema    Legally blind in left eye, as defined in [place]    Lymphedema    Mild pulmonary hypertension ([place]) 09/13/2016   a.) TTE 09/13/2016: EF 55%; RVSP 45 mmHg. b.) TTE 12/25/2018: EF 55-60%; RVSP 35.5 mmHg.   Obesity    Onychomycosis    PONV (postoperative nausea and vomiting)    S/P BKA (below knee amputation) unilateral, left ([place])    Sepsis ([place]) 12/2018   a.) group G streptococcal bacteremia with AV endocarditis and LLE soft tissue infection.   T2DM (type 2 diabetes mellitus) [facility])      Past Surgical History:  Procedure Laterality Date   A/V FISTULAGRAM Left 06/18/2022   Procedure: A/V Fistulagram;  Surgeon: Katha Cabal, MD;  Location: [place] [facility];  Service: Cardiovascular;  Laterality: Left;   A/V FISTULAGRAM Left 08/13/2022   Procedure: A/V Fistulagram;  Surgeon: Katha Cabal, MD;  Location: [place] [facility];  Service: Cardiovascular;  Laterality: Left;   ABDOMINAL HYSTERECTOMY     AMPUTATION  Left 05/05/2019   Procedure: AMPUTATION BELOW KNEE;  Surgeon: Katha Cabal, MD;  Location: [facility];  Service: Vascular;  Laterality: Left;   AV FISTULA PLACEMENT Right 09/19/2021   Procedure: ARTERIOVENOUS (AV) FISTULA CREATION (BRACHIAL CEPHALIC);  Surgeon: Katha Cabal, MD;  Location: [facility];  Service: Vascular;  Laterality: Right;   AV FISTULA PLACEMENT Left 11/09/2021   Procedure: ARTERIOVENOUS (AV) FISTULA CREATION ( BRACHIAL CEPHALIC );  Surgeon: Katha Cabal, MD;  Location: [facility];  Service: Vascular;  Laterality: Left;   BREAST BIOPSY Left 2014   FNA 12:00 position - Negative   DIALYSIS/PERMA CATHETER INSERTION N/A 03/28/2022   Procedure: DIALYSIS/PERMA CATHETER INSERTION;  Surgeon: Algernon Huxley, MD;  Location: [place] [facility];  Service: Cardiovascular;  Laterality: N/A;   EYE SURGERY Left 2007   removed a lens, no lens implanted   IR FLUORO GUIDE CV LINE RIGHT  12/29/2018   IR REMOVAL TUN CV CATH W/O FL  02/19/2019   IR US GUIDE VASC ACCESS RIGHT  12/29/2018   TEE WITHOUT CARDIOVERSION N/A 12/28/2018   Procedure: TRANSESOPHAGEAL ECHOCARDIOGRAM (TEE);  Surgeon: Sueanne Margarita, MD;  Location: [facility];  Service: Cardiovascular;  Laterality: N/A;    Social History Social History   Tobacco Use   Smoking status: Never   Smokeless tobacco: Never  Vaping Use   Vaping Use: Never used  Substance Use Topics   Alcohol use: No   Drug use: Never    Family History Family History  Problem Relation Age of Onset    Breast cancer Sister 71   Diabetes Sister    Diabetes Mother    Hypertension Mother    Hyperlipidemia Mother    Eating disorder Mother    Obesity Mother     Allergies  Allergen Reactions   Nifedipine Rash   Penicillins Hives, Shortness Of Breath and Swelling   Statins Shortness Of Breath    Wheezing   Hydralazine Itching   Hydrocodone Itching and Nausea Only   Oxycodone Nausea Only   Codeine Nausea Only   Ibuprofen Other (See Comments)    Raises blood pressure     REVIEW OF SYSTEMS (Negative unless checked)  Constitutional: _0 Weight loss  _1 Fever  _2 Chills Cardiac: _3 Chest pain   _4 Chest pressure   _5 Palpitations   _6 Shortness of breath when laying flat   _7 Shortness of breath with exertion. Vascular:  _8 Pain in legs with walking   _9 Pain in legs at rest  _10 History of DVT   _11 Phlebitis   _12 Swelling in legs   _13 Varicose veins   _14 Non-healing ulcers Pulmonary:   _15 Uses home oxygen   _16 Productive cough   _17 Hemoptysis   _18 Wheeze  _19 COPD   _20 Asthma Neurologic:  _21 Dizziness   _22 Seizures   _23 History of stroke   _24 History of TIA  _25 Aphasia   _26 Vissual changes   _27 Weakness or numbness in arm   _28 Weakness or numbness in leg Musculoskeletal:   _29 Joint swelling   _30 Joint pain   _31 Low back pain Hematologic:  _32 Easy bruising  _33 Easy bleeding   _34 Hypercoagulable state   _35 Anemic Gastrointestinal:  _36 Diarrhea   _37 Vomiting  _38 Gastroesophageal reflux/heartburn   _39 Difficulty swallowing. Genitourinary:  _40 Chronic kidney disease   _41 Difficult urination  _42 Frequent urination   _43 Blood in urine Skin:  _44 Rashes   _45 Ulcers  Psychological:  _46 History of anxiety   _47  History of major depression.  Physical Examination  Vitals:   10/16/22 1113  BP: (!) 142/71  Pulse: 66  Resp: 16  Temp: 97.6 F (  36.4 C)  TempSrc: Temporal  SpO2: 100%  Weight: (!) 136.1 kg  Height: _0  (1.727 m)   Body mass index is 45.62 kg/m. Gen: WD/WN, NAD Head: [place]/AT, No temporalis wasting.  Ear/Nose/Throat:  Hearing grossly intact, nares w/o erythema or drainage Eyes: PER, EOMI, sclera nonicteric.  Neck: Supple, no gross masses or lesions.  No JVD.  Pulmonary:  Good air movement, no audible wheezing, no use of accessory muscles.  Cardiac: RRR, precordium non-hyperdynamic. Vascular:   Left brachiocephalic fistula good thrill good bruit Vessel Right Left  Radial Palpable Palpable  Brachial Palpable Palpable  Gastrointestinal: soft, non-distended. No guarding/no peritoneal signs.  Musculoskeletal: M/S 5/5 throughout.  No deformity.  Neurologic: CN 2-12 intact. Pain and light touch intact in extremities.  Symmetrical.  Speech is fluent. Motor exam as listed above. Psychiatric: Judgment intact, Mood & affect appropriate for pt's clinical situation. Dermatologic: No rashes or ulcers noted.  No changes consistent with cellulitis.   CBC Lab Results  Component Value Date   WBC 6.4 10/10/2022   HGB 11.9 (L) 10/16/2022   HCT 35.0 (L) 10/16/2022   MCV 90.3 10/10/2022   PLT 167 10/10/2022    BMET    Component Value Date/Time   NA 137 10/16/2022 1127   NA 139 06/25/2018 1050   NA 137 11/21/2013 0627   K 5.1 10/16/2022 1127   K 4.6 11/21/2013 0627   CL 102 10/16/2022 1127   CL 107 11/21/2013 0627   CO2 23 10/10/2022 1310   CO2 24 11/21/2013 0627   GLUCOSE 209 (H) 10/16/2022 1127   GLUCOSE 95 11/21/2013 0627   BUN 40 (H) 10/16/2022 1127   BUN 43 (H) 06/25/2018 1050   BUN 34 (H) 11/21/2013 0627   CREATININE 4.70 (H) 10/16/2022 1127   CREATININE 1.80 (H) 09/09/2014 1220   CALCIUM 8.6 (L) 10/10/2022 1310   CALCIUM 8.8 09/09/2014 1220   GFRNONAA 12 (L) 10/10/2022 1310   GFRNONAA 31 (L) 09/09/2014 1220   GFRNONAA 26 (L) 11/21/2013 0627   GFRAA 17 (L) 05/28/2019 2252   GFRAA 38 (L) 09/09/2014 1220   GFRAA 30 (L) 11/21/2013 8921   Estimated Creatinine Clearance: 18.4 mL/min (A) (by C-G formula based on SCr of 4.7 mg/dL (H)).  COAG Lab Results  Component Value Date   INR 1.1 09/17/2021    INR 0.8 05/29/2019   INR 1.1 04/30/2019    Radiology No results found.   Assessment/Plan 1. ESRD (end stage renal disease) ([place]) Based on the patient's recent noninvasive studies she is noted to have a very deep fistula with a tortuous fistula as well.  Based on this even if we were to superficial lysed the access, it still may have difficulty with cannulation.  Based on the recent intervention, it would be in the patient's best interest to move forward with jump graft revision using Artegraft.  I discussed the risk, benefits and alternatives the patient she is agreeable to proceed.   2. Essential hypertension Continue antihypertensive medications as already ordered, these medications have been reviewed and there are no changes at this time.    3. Type 2 diabetes mellitus with diabetic peripheral angiopathy without gangrene, with long-term current use of insulin ([place]) Continue hypoglycemic medications as already ordered, these medications have been reviewed and there are no changes at this time.   Hgb A1C to be monitored as already arranged by primary service    [name], MD  10/16/2022 12:21 PM

## 2022-10-16 NOTE — Interval H&P Note (Signed)
History and Physical Interval Note:  10/16/2022 12:23 PM  Rachel Holmes  has presented today for surgery, with the diagnosis of ESRD.  The various methods of treatment have been discussed with the patient and family. After consideration of risks, benefits and other options for treatment, the patient has consented to  Procedure(s): REVISON OF ARTERIOVENOUS FISTULA (Left) as a surgical intervention.  The patient's history has been reviewed, patient examined, no change in status, stable for surgery.  I have reviewed the patient's chart and labs.  Questions were answered to the patient's satisfaction.     [name]

## 2022-10-16 NOTE — Progress Notes (Signed)
MRN : [id number]  Rachel MITSCHKE is a 61 y.o. (1961/03/01) female who presents with chief complaint of check access.  History of Present Illness:   Patient presents to [facility] for revision of her left arm AV fistula.  She was recently seen in the office and is a 61 year old female who returns today following recent intervention of her left upper extremity dialysis access. The patient has a left brachiocephalic AV fistula. The patient has had difficulty with fistula maturation. Her recent fistulogram noted that the patient had a deep fistula which was also very tortuous in nature. Based on this it was superficialization she will likely continue to have difficulties. She continues to be maintained via PermCath.   Current Meds  Medication Sig   acetaminophen (TYLENOL) 500 MG tablet Take 1,000 mg by mouth every 6 (six) hours as needed.   albuterol (PROVENTIL HFA;VENTOLIN HFA) 108 (90 Base) MCG/ACT inhaler Inhale 1-2 puffs into the lungs every 6 (six) hours as needed for wheezing or shortness of breath.   aspirin EC 81 MG EC tablet Take 1 tablet (81 mg total) by mouth daily.   Blood Glucose Monitoring Suppl (ONE TOUCH ULTRA 2) w/Device KIT 1 Device by Does not apply route daily.   calcitRIOL (ROCALTROL) 0.25 MCG capsule Take 0.25 mcg by mouth daily.   cetirizine (ZYRTEC) 10 MG tablet Take 10 mg by mouth at bedtime.   clobetasol cream (TEMOVATE) 5.37 % Apply 1 application topically 2 (two) times daily as needed (rash).   cloNIDine (CATAPRES) 0.3 MG tablet Take 1 tablet (0.3 mg total) by mouth 2 (two) times daily.   Continuous Blood Gluc Receiver (FREESTYLE LIBRE 14 DAY READER) DEVI USE AS DIRECTED   Continuous Blood Gluc Sensor (FREESTYLE LIBRE 14 DAY SENSOR) MISC USE DEVICE FOR 14 DAYS AS DIRECTED   docusate sodium (COLACE) 100 MG capsule Take 1 capsule (100 mg total) by mouth 2 (two) times daily.   doxazosin (CARDURA) 4 MG tablet Take 1 tablet (4 mg  total) by mouth at bedtime.   DULoxetine (CYMBALTA) 30 MG capsule Take 30 mg by mouth at bedtime.   ezetimibe (ZETIA) 10 MG tablet Take 1 tablet (10 mg total) by mouth daily. (Patient taking differently: Take 10 mg by mouth at bedtime.)   gabapentin (NEURONTIN) 600 MG tablet Take 600 mg by mouth at bedtime.   gabapentin (NEURONTIN) 600 MG tablet Take 300 mg by mouth daily.   glucosamine-chondroitin 500-400 MG tablet Take 1 tablet by mouth in the morning and at bedtime.   insulin lispro (HUMALOG KWIKPEN) 100 UNIT/ML KwikPen 3 times a day (just before each meal) 16-18-19 units, and pen needles 3 day. (Patient taking differently: Inject 24 Units into the skin 3 (three) times daily with meals.)   Insulin Pen Needle (BD PEN NEEDLE MICRO U/F) 32G X 6 MM MISC 1 each by Does not apply route 3 (three) times daily.   isosorbide mononitrate (IMDUR) 120 MG 24 hr tablet Take 1 tablet (120 mg total) by mouth daily. (Patient taking differently: Take 120 mg by mouth in the morning and at bedtime.)   lactulose (CHRONULAC) 10 GM/15ML solution Take 15 mLs by mouth daily as needed.   Lancets (ONETOUCH ULTRASOFT) lancets Used to check blood sugars four times daily.   Magnesium 400 MG CAPS Take 1 capsule by mouth at bedtime.   methocarbamol (ROBAXIN)  750 MG tablet Take 1 tablet (750 mg total) by mouth every 12 (twelve) hours as needed for muscle spasms.   Multiple Vitamin (MULTIVITAMIN WITH MINERALS) TABS tablet Take 1 tablet by mouth daily.   olmesartan (BENICAR) 40 MG tablet Take 40 mg by mouth at bedtime.   omeprazole (PRILOSEC) 40 MG capsule Take 40 mg by mouth 2 (two) times daily with breakfast and lunch.   ondansetron (ZOFRAN ODT) 4 MG disintegrating tablet Take 1 tablet (4 mg total) by mouth every 8 (eight) hours as needed for nausea or vomiting.   ONETOUCH VERIO test strip USE TO CHECK BLOOD SUGAR TWICE DAILY   torsemide (DEMADEX) 100 MG tablet Take 100 mg by mouth every morning.   traMADol (ULTRAM) 50 MG  tablet Take 1 tablet (50 mg total) by mouth every 6 (six) hours as needed for moderate pain or severe pain.   vitamin B-12 (CYANOCOBALAMIN) 1000 MCG tablet Take 1,000 mcg by mouth daily.    Past Medical History:  Diagnosis Date   Allergic rhinitis    Allergy    Anemia    Anxiety    Aortic stenosis 09/13/2016   a.) TTE 09/13/2016: mild AS --> MPG 11.2 mmHg. b.) TTE 12/27/2020: EF 55-60%; no AS --> MPG 8 mmHg.   Aortic valve endocarditis 12/25/2018   a.) TTE 12/25/2018: 0.75 x 1 cm AV mass. b.) TEE 12/28/2018: small shaggy mobile density on aortic side of RIGHT coronary cusp consistent with vegetation.   Arthritis    Back pain    Bradycardia    Breast mass    Patient can no longer palpate specific masses but showed tech general area of concern   Charcot's joint of ankle, left    CHF (congestive heart failure) ([place]) 07/09/2007   a.) TTE 07/09/2007: EF 50%; G1DD. b.) TTE 09/13/2016: EF 55%; G2DD. c.) TTE 12/27/2020: EF 55-60%; G2DD; GLS -15.1%.   CKD (chronic kidney disease), stage III (HCC)    Complication of anesthesia    a.) PONV. b.) Delayed emergence   Constipation    Diabetic nephropathy (HCC)    DVT (deep venous thrombosis) (HCC)    Dyspnea    Family history of adverse reaction to anesthesia    a.) Sisters x 2 with (+) delayed emergence.   GERD (gastroesophageal reflux disease)    Heart murmur    HLD (hyperlipidemia)    Hyperparathyroidism ([place])    Hypertension    Joint pain    Leg edema    Legally blind in left eye, as defined in [place]    Lymphedema    Mild pulmonary hypertension ([place]) 09/13/2016   a.) TTE 09/13/2016: EF 55%; RVSP 45 mmHg. b.) TTE 12/25/2018: EF 55-60%; RVSP 35.5 mmHg.   Obesity    Onychomycosis    PONV (postoperative nausea and vomiting)    S/P BKA (below knee amputation) unilateral, left ([place])    Sepsis ([place]) 12/2018   a.) group G streptococcal bacteremia with AV endocarditis and LLE soft tissue infection.   T2DM (type 2 diabetes mellitus) [facility])      Past Surgical History:  Procedure Laterality Date   A/V FISTULAGRAM Left 06/18/2022   Procedure: A/V Fistulagram;  Surgeon: Katha Cabal, MD;  Location: [place] [facility];  Service: Cardiovascular;  Laterality: Left;   A/V FISTULAGRAM Left 08/13/2022   Procedure: A/V Fistulagram;  Surgeon: Katha Cabal, MD;  Location: [place] [facility];  Service: Cardiovascular;  Laterality: Left;   ABDOMINAL HYSTERECTOMY     AMPUTATION  Left 05/05/2019   Procedure: AMPUTATION BELOW KNEE;  Surgeon: Katha Cabal, MD;  Location: [facility];  Service: Vascular;  Laterality: Left;   AV FISTULA PLACEMENT Right 09/19/2021   Procedure: ARTERIOVENOUS (AV) FISTULA CREATION (BRACHIAL CEPHALIC);  Surgeon: Katha Cabal, MD;  Location: [facility];  Service: Vascular;  Laterality: Right;   AV FISTULA PLACEMENT Left 11/09/2021   Procedure: ARTERIOVENOUS (AV) FISTULA CREATION ( BRACHIAL CEPHALIC );  Surgeon: Katha Cabal, MD;  Location: [facility];  Service: Vascular;  Laterality: Left;   BREAST BIOPSY Left 2014   FNA 12:00 position - Negative   DIALYSIS/PERMA CATHETER INSERTION N/A 03/28/2022   Procedure: DIALYSIS/PERMA CATHETER INSERTION;  Surgeon: Algernon Huxley, MD;  Location: [place] [facility];  Service: Cardiovascular;  Laterality: N/A;   EYE SURGERY Left 2007   removed a lens, no lens implanted   IR FLUORO GUIDE CV LINE RIGHT  12/29/2018   IR REMOVAL TUN CV CATH W/O FL  02/19/2019   IR US GUIDE VASC ACCESS RIGHT  12/29/2018   TEE WITHOUT CARDIOVERSION N/A 12/28/2018   Procedure: TRANSESOPHAGEAL ECHOCARDIOGRAM (TEE);  Surgeon: Sueanne Margarita, MD;  Location: [facility];  Service: Cardiovascular;  Laterality: N/A;    Social History Social History   Tobacco Use   Smoking status: Never   Smokeless tobacco: Never  Vaping Use   Vaping Use: Never used  Substance Use Topics   Alcohol use: No   Drug use: Never    Family History Family History  Problem Relation Age of Onset    Breast cancer Sister 71   Diabetes Sister    Diabetes Mother    Hypertension Mother    Hyperlipidemia Mother    Eating disorder Mother    Obesity Mother     Allergies  Allergen Reactions   Nifedipine Rash   Penicillins Hives, Shortness Of Breath and Swelling   Statins Shortness Of Breath    Wheezing   Hydralazine Itching   Hydrocodone Itching and Nausea Only   Oxycodone Nausea Only   Codeine Nausea Only   Ibuprofen Other (See Comments)    Raises blood pressure     REVIEW OF SYSTEMS (Negative unless checked)  Constitutional: _0 Weight loss  _1 Fever  _2 Chills Cardiac: _3 Chest pain   _4 Chest pressure   _5 Palpitations   _6 Shortness of breath when laying flat   _7 Shortness of breath with exertion. Vascular:  _8 Pain in legs with walking   _9 Pain in legs at rest  _10 History of DVT   _11 Phlebitis   _12 Swelling in legs   _13 Varicose veins   _14 Non-healing ulcers Pulmonary:   _15 Uses home oxygen   _16 Productive cough   _17 Hemoptysis   _18 Wheeze  _19 COPD   _20 Asthma Neurologic:  _21 Dizziness   _22 Seizures   _23 History of stroke   _24 History of TIA  _25 Aphasia   _26 Vissual changes   _27 Weakness or numbness in arm   _28 Weakness or numbness in leg Musculoskeletal:   _29 Joint swelling   _30 Joint pain   _31 Low back pain Hematologic:  _32 Easy bruising  _33 Easy bleeding   _34 Hypercoagulable state   _35 Anemic Gastrointestinal:  _36 Diarrhea   _37 Vomiting  _38 Gastroesophageal reflux/heartburn   _39 Difficulty swallowing. Genitourinary:  _40 Chronic kidney disease   _41 Difficult urination  _42 Frequent urination   _43 Blood in urine Skin:  _44 Rashes   _45 Ulcers  Psychological:  _46 History of anxiety   _47  History of major depression.  Physical Examination  Vitals:   10/16/22 1113  BP: (!) 142/71  Pulse: 66  Resp: 16  Temp: 97.6 F (  36.4 C)  TempSrc: Temporal  SpO2: 100%  Weight: (!) 136.1 kg  Height: _0  (1.727 m)   Body mass index is 45.62 kg/m. Gen: WD/WN, NAD Head: [place]/AT, No temporalis wasting.  Ear/Nose/Throat:  Hearing grossly intact, nares w/o erythema or drainage Eyes: PER, EOMI, sclera nonicteric.  Neck: Supple, no gross masses or lesions.  No JVD.  Pulmonary:  Good air movement, no audible wheezing, no use of accessory muscles.  Cardiac: RRR, precordium non-hyperdynamic. Vascular:   Left brachiocephalic fistula good thrill good bruit Vessel Right Left  Radial Palpable Palpable  Brachial Palpable Palpable  Gastrointestinal: soft, non-distended. No guarding/no peritoneal signs.  Musculoskeletal: M/S 5/5 throughout.  No deformity.  Neurologic: CN 2-12 intact. Pain and light touch intact in extremities.  Symmetrical.  Speech is fluent. Motor exam as listed above. Psychiatric: Judgment intact, Mood & affect appropriate for pt's clinical situation. Dermatologic: No rashes or ulcers noted.  No changes consistent with cellulitis.   CBC Lab Results  Component Value Date   WBC 6.4 10/10/2022   HGB 11.9 (L) 10/16/2022   HCT 35.0 (L) 10/16/2022   MCV 90.3 10/10/2022   PLT 167 10/10/2022    BMET    Component Value Date/Time   NA 137 10/16/2022 1127   NA 139 06/25/2018 1050   NA 137 11/21/2013 0627   K 5.1 10/16/2022 1127   K 4.6 11/21/2013 0627   CL 102 10/16/2022 1127   CL 107 11/21/2013 0627   CO2 23 10/10/2022 1310   CO2 24 11/21/2013 0627   GLUCOSE 209 (H) 10/16/2022 1127   GLUCOSE 95 11/21/2013 0627   BUN 40 (H) 10/16/2022 1127   BUN 43 (H) 06/25/2018 1050   BUN 34 (H) 11/21/2013 0627   CREATININE 4.70 (H) 10/16/2022 1127   CREATININE 1.80 (H) 09/09/2014 1220   CALCIUM 8.6 (L) 10/10/2022 1310   CALCIUM 8.8 09/09/2014 1220   GFRNONAA 12 (L) 10/10/2022 1310   GFRNONAA 31 (L) 09/09/2014 1220   GFRNONAA 26 (L) 11/21/2013 0627   GFRAA 17 (L) 05/28/2019 2252   GFRAA 38 (L) 09/09/2014 1220   GFRAA 30 (L) 11/21/2013 8921   Estimated Creatinine Clearance: 18.4 mL/min (A) (by C-G formula based on SCr of 4.7 mg/dL (H)).  COAG Lab Results  Component Value Date   INR 1.1 09/17/2021    INR 0.8 05/29/2019   INR 1.1 04/30/2019    Radiology No results found.   Assessment/Plan 1. ESRD (end stage renal disease) ([place]) Based on the patient's recent noninvasive studies she is noted to have a very deep fistula with a tortuous fistula as well.  Based on this even if we were to superficial lysed the access, it still may have difficulty with cannulation.  Based on the recent intervention, it would be in the patient's best interest to move forward with jump graft revision using Artegraft.  I discussed the risk, benefits and alternatives the patient she is agreeable to proceed.   2. Essential hypertension Continue antihypertensive medications as already ordered, these medications have been reviewed and there are no changes at this time.    3. Type 2 diabetes mellitus with diabetic peripheral angiopathy without gangrene, with long-term current use of insulin ([place]) Continue hypoglycemic medications as already ordered, these medications have been reviewed and there are no changes at this time.   Hgb A1C to be monitored as already arranged by primary service    [name], MD  10/16/2022 12:21 PM

## 2022-10-16 NOTE — Op Note (Signed)
OPERATIVE NOTE   PROCEDURE: Revision left brachial cephalic interposition graft placement  PRE-OPERATIVE DIAGNOSIS: End Stage Renal Disease  POST-OPERATIVE DIAGNOSIS: End Stage Renal Disease  SURGEON: [name]  ASSISTANT(S): Annalee Genta  ANESTHESIA: general  ESTIMATED BLOOD LOSS: <50 cc  FINDING(S): Cephalic vein with tandem strictures near the antecubital fossa and marked tortuosity therefore the fistula is being revised using an interposition Artegraft  SPECIMEN(S):  none  INDICATIONS:   Rachel Holmes is a 61 y.o. female who presents with end stage renal disease.  The patient is scheduled for left brachial axillary AV graft placement.  The patient is aware the risks include but are not limited to: bleeding, infection, steal syndrome, nerve damage, ischemic monomelic neuropathy, failure to mature, and need for additional procedures.  The patient is aware of the risks of the procedure and elects to proceed forward.  DESCRIPTION: After full informed written consent was obtained from the patient, the patient was brought back to the operating room and placed supine upon the operating table.  Prior to induction, the patient received IV antibiotics.   After obtaining adequate anesthesia, the patient was then prepped and draped in the standard fashion for a left arm access procedure.   A first assistant was required to provide a safe and appropriate environment for executing the surgery.  The assistant was integral in providing retraction, exposure, running suture providing suction and in the closing process.   A linear incision was then created over the existing fistula and the proximal 3 to 4 cm of the cephalic vein just above the arterial anastomosis was exposed through. The cephalic vein was then looped proximally and distally with Silastic Vesseloops. Side branches were controlled with 4-0 silk ties.  Attention was then turned to the exposure of the proximal cephalic  vein at the level of the deltoid muscle. Linear incision was then created over the vein which was localized with ultrasound prior to making incision. The cephalic vein was exposed and again looped proximally and distally with Silastic vessel loops. Associated tributaries were also controlled with Silastic Vesseloops.  The Gore tunneler was then delivered onto the field and a subcutaneous path was made from the arterial incision to the venous incision. A 7 millimeter Artegraft was then pulled through the subcutaneous tunnel.  The distal end of the Artegraft was then approximated to the segment of the cephalic vein above the anastomosis and an end-to-end graft to vein anastomosis was fashioned with running six 6-0 Prolene suture.  Graft was then pressurized checked for uniformity free of kinks and close to the skin for easy access.  It was then flushed with heparinized saline and clamped near the antecubital fossa.  It was approximated to the cephalic vein and then trimmed to the appropriate length.  Cephalic vein was opened with an 11 blade and Potts scissors after control with Silastic Vesseloops.  An endograft to side vein anastomosis was then fashioned using running 6-0 Prolene.  Flushing maneuvers were performed and flow was reestablished through the revised fistula.    There was good  thrill in the venous outflow, and there was 1+ palpable radial pulse.  At this point, I irrigated out the surgical wounds.  There was no further active bleeding.  The subcutaneous tissue was reapproximated with a running stitch of 3-0 Vicryl.  The skin was then reapproximated with a running subcuticular stitch of 4-0 Vicryl.  The skin was then cleaned, dried, and reinforced with Dermabond.    The patient  tolerated this procedure well.   COMPLICATIONS: None  CONDITION: [facility]  Office: [phone]   10/16/2022, 3:07 PM

## 2022-10-17 ENCOUNTER — Encounter: Payer: Self-pay | Admitting: Vascular Surgery

## 2022-10-21 NOTE — Anesthesia Postprocedure Evaluation (Signed)
Anesthesia Post Note  Patient: Rachel Holmes  Procedure(s) Performed: REVISON OF ARTERIOVENOUS FISTULA (Left)  Patient location during evaluation: PACU Anesthesia Type: General Level of consciousness: awake and alert Pain management: pain level controlled Vital Signs Assessment: post-procedure vital signs reviewed and stable Respiratory status: spontaneous breathing, nonlabored ventilation, respiratory function stable and patient connected to nasal cannula oxygen Cardiovascular status: blood pressure returned to baseline and stable Postop Assessment: no apparent nausea or vomiting Anesthetic complications: no   No notable events documented.   Last Vitals:  Vitals:   10/16/22 1530 10/16/22 1546  BP: (!) 164/73 (!) 183/73  Pulse:  77  Resp:  18  Temp:  36.4 C  SpO2:  95%    Last Pain:  Vitals:   10/16/22 1546  TempSrc: Temporal  PainSc: 0-No pain                 Martha Clan

## 2022-11-12 ENCOUNTER — Other Ambulatory Visit (INDEPENDENT_AMBULATORY_CARE_PROVIDER_SITE_OTHER): Payer: Self-pay | Admitting: Vascular Surgery

## 2022-11-12 DIAGNOSIS — N186 End stage renal disease: Secondary | ICD-10-CM

## 2022-11-12 DIAGNOSIS — Z95820 Peripheral vascular angioplasty status with implants and grafts: Secondary | ICD-10-CM

## 2022-11-13 ENCOUNTER — Ambulatory Visit (INDEPENDENT_AMBULATORY_CARE_PROVIDER_SITE_OTHER): Payer: Medicare Other | Admitting: Nurse Practitioner

## 2022-11-13 ENCOUNTER — Ambulatory Visit (INDEPENDENT_AMBULATORY_CARE_PROVIDER_SITE_OTHER): Payer: Medicare Other

## 2022-11-13 DIAGNOSIS — N186 End stage renal disease: Secondary | ICD-10-CM

## 2022-11-13 DIAGNOSIS — Z95820 Peripheral vascular angioplasty status with implants and grafts: Secondary | ICD-10-CM

## 2022-11-24 ENCOUNTER — Encounter (INDEPENDENT_AMBULATORY_CARE_PROVIDER_SITE_OTHER): Payer: Self-pay | Admitting: Nurse Practitioner

## 2022-11-24 NOTE — Progress Notes (Signed)
Subjective:    Patient ID: Rachel Holmes, female    DOB: 1961-01-11, 62 y.o.   MRN: [id number] Chief Complaint  Patient presents with   Follow-up    Currently Rachel Holmes is a 62 year old female who presents today after recent revision of her brachiocephalic AV fistula.  Initially the cephalic vein and tandem strictures as well as being very tortuous.  Due to this the fistula was revised using Artegraft.  Today the patient has a good thrill and bruit.  The wound is well-healed.  Today the patient has a flow volume of 2093.  No evidence of significant stenosis is noted.    Review of Systems  All other systems reviewed and are negative.      Objective:   Physical Exam Vitals reviewed.  HENT:     Head: Normocephalic.  Cardiovascular:     Rate and Rhythm: Normal rate.     Pulses: Normal pulses.  Pulmonary:     Effort: Pulmonary effort is normal.  Musculoskeletal:     Left Lower Extremity: Left leg is amputated below knee.  Skin:    General: Skin is warm and dry.  Neurological:     Mental Status: She is alert and oriented to person, place, and time.     Motor: Weakness present.     Gait: Gait abnormal.  Psychiatric:        Mood and Affect: Mood normal.        Behavior: Behavior normal.        Thought Content: Thought content normal.        Judgment: Judgment normal.     BP (!) 184/76   Pulse 73   Ht 5\' 8"  (1.727 m)   Wt 300 lb (136.1 kg)   BMI 45.61 kg/m   Past Medical History:  Diagnosis Date   Allergic rhinitis    Allergy    Anemia    Anxiety    Aortic stenosis 09/13/2016   a.) TTE 09/13/2016: mild AS --> MPG 11.2 mmHg. b.) TTE 12/27/2020: EF 55-60%; no AS --> MPG 8 mmHg.   Aortic valve endocarditis 12/25/2018   a.) TTE 12/25/2018: 0.75 x 1 cm AV mass. b.) TEE 12/28/2018: small shaggy mobile density on aortic side of RIGHT coronary cusp consistent with vegetation.   Arthritis    Back pain    Bradycardia    Breast mass    Patient can no longer palpate  specific masses but showed tech general area of concern   Charcot's joint of ankle, left    CHF (congestive heart failure) ([place]) 07/09/2007   a.) TTE 07/09/2007: EF 50%; G1DD. b.) TTE 09/13/2016: EF 55%; G2DD. c.) TTE 12/27/2020: EF 55-60%; G2DD; GLS -15.1%.   CKD (chronic kidney disease), stage III (HCC)    Complication of anesthesia    a.) PONV. b.) Delayed emergence   Constipation    Diabetic nephropathy (HCC)    DVT (deep venous thrombosis) (HCC)    Dyspnea    Family history of adverse reaction to anesthesia    a.) Sisters x 2 with (+) delayed emergence.   GERD (gastroesophageal reflux disease)    Heart murmur    HLD (hyperlipidemia)    Hyperparathyroidism ([place])    Hypertension    Joint pain    Leg edema    Legally blind in left eye, as defined in [place]    Lymphedema    Mild pulmonary hypertension ([place]) 09/13/2016   a.) TTE 09/13/2016: EF 55%; RVSP 45 mmHg. b.) TTE  12/25/2018: EF 55-60%; RVSP 35.5 mmHg.   Obesity    Onychomycosis    PONV (postoperative nausea and vomiting)    S/P BKA (below knee amputation) unilateral, left ([place])    Sepsis ([place]) 12/2018   a.) group G streptococcal bacteremia with AV endocarditis and LLE soft tissue infection.   T2DM (type 2 diabetes mellitus) ([place])     Social History   Socioeconomic History   Marital status: Divorced    Spouse name: Not on file   Number of children: 1   Years of education: Not on file   Highest education level: Not on file  Occupational History   Occupation: Glass blower/designer    Comment: disability  Tobacco Use   Smoking status: Never   Smokeless tobacco: Never  Vaping Use   Vaping Use: Never used  Substance and Sexual Activity   Alcohol use: No   Drug use: Never   Sexual activity: Not on file  Other Topics Concern   Not on file  Social History Narrative   ** Merged History Encounter **    on disability   Lives alone   Social Determinants of Health   Financial Resource Strain: Not on file  Food  Insecurity: Not on file  Transportation Needs: Not on file  Physical Activity: Not on file  Stress: Not on file  Social Connections: Not on file  Intimate Partner Violence: Not on file    Past Surgical History:  Procedure Laterality Date   A/V FISTULAGRAM Left 06/18/2022   Procedure: A/V Fistulagram;  Surgeon: Katha Cabal, MD;  Location: [facility];  Service: Cardiovascular;  Laterality: Left;   A/V FISTULAGRAM Left 08/13/2022   Procedure: A/V Fistulagram;  Surgeon: Katha Cabal, MD;  Location: [facility];  Service: Cardiovascular;  Laterality: Left;   ABDOMINAL HYSTERECTOMY     AMPUTATION Left 05/05/2019   Procedure: AMPUTATION BELOW KNEE;  Surgeon: Katha Cabal, MD;  Location: [facility];  Service: Vascular;  Laterality: Left;   AV FISTULA PLACEMENT Right 09/19/2021   Procedure: ARTERIOVENOUS (AV) FISTULA CREATION (BRACHIAL CEPHALIC);  Surgeon: Katha Cabal, MD;  Location: [facility];  Service: Vascular;  Laterality: Right;   AV FISTULA PLACEMENT Left 11/09/2021   Procedure: ARTERIOVENOUS (AV) FISTULA CREATION ( BRACHIAL CEPHALIC );  Surgeon: Katha Cabal, MD;  Location: [facility];  Service: Vascular;  Laterality: Left;   BREAST BIOPSY Left 2014   FNA 12:00 position - Negative   DIALYSIS/PERMA CATHETER INSERTION N/A 03/28/2022   Procedure: DIALYSIS/PERMA CATHETER INSERTION;  Surgeon: Algernon Huxley, MD;  Location: [facility];  Service: Cardiovascular;  Laterality: N/A;   EYE SURGERY Left 2007   removed a lens, no lens implanted   IR FLUORO GUIDE CV LINE RIGHT  12/29/2018   IR REMOVAL TUN CV CATH W/O FL  02/19/2019   IR US GUIDE VASC ACCESS RIGHT  12/29/2018   REVISON OF ARTERIOVENOUS FISTULA Left 10/16/2022   Procedure: REVISON OF ARTERIOVENOUS FISTULA;  Surgeon: Katha Cabal, MD;  Location: [facility];  Service: Vascular;  Laterality: Left;   TEE WITHOUT CARDIOVERSION N/A 12/28/2018   Procedure: TRANSESOPHAGEAL ECHOCARDIOGRAM  (TEE);  Surgeon: Sueanne Margarita, MD;  Location: [facility];  Service: Cardiovascular;  Laterality: N/A;    Family History  Problem Relation Age of Onset   Breast cancer Sister 79   Diabetes Sister    Diabetes Mother    Hypertension Mother    Hyperlipidemia Mother    Eating  disorder Mother    Obesity Mother     Allergies  Allergen Reactions   Nifedipine Rash   Penicillins Hives, Shortness Of Breath and Swelling   Statins Shortness Of Breath    Wheezing   Hydralazine Itching   Hydrocodone Itching and Nausea Only   Oxycodone Nausea Only   Codeine Nausea Only   Ibuprofen Other (See Comments)    Raises blood pressure       Latest Ref Rng & Units 10/16/2022   11:27 AM 10/10/2022    1:10 PM 11/09/2021    6:48 AM  CBC  WBC 4.0 - 10.5 K/uL  6.4    Hemoglobin 12.0 - 15.0 g/dL 11.9  11.9  10.2   Hematocrit 36.0 - 46.0 % 35.0  38.3  30.0   Platelets 150 - 400 K/uL  167        CMP     Component Value Date/Time   NA 137 10/16/2022 1127   NA 139 06/25/2018 1050   NA 137 11/21/2013 0627   K 5.1 10/16/2022 1127   K 4.6 11/21/2013 0627   CL 102 10/16/2022 1127   CL 107 11/21/2013 0627   CO2 23 10/10/2022 1310   CO2 24 11/21/2013 0627   GLUCOSE 209 (H) 10/16/2022 1127   GLUCOSE 95 11/21/2013 0627   BUN 40 (H) 10/16/2022 1127   BUN 43 (H) 06/25/2018 1050   BUN 34 (H) 11/21/2013 0627   CREATININE 4.70 (H) 10/16/2022 1127   CREATININE 1.80 (H) 09/09/2014 1220   CALCIUM 8.6 (L) 10/10/2022 1310   CALCIUM 8.8 09/09/2014 1220   PROT 8.2 (H) 10/15/2021 1357   PROT 6.4 06/25/2018 1050   PROT 7.9 11/19/2013 0934   ALBUMIN 4.1 10/15/2021 1357   ALBUMIN 3.5 06/25/2018 1050   ALBUMIN 3.2 (L) 11/19/2013 0934   AST 23 10/15/2021 1357   AST 19 11/19/2013 0934   ALT 16 10/15/2021 1357   ALT 20 11/19/2013 0934   ALKPHOS 112 10/15/2021 1357   ALKPHOS 95 11/19/2013 0934   BILITOT 0.5 10/15/2021 1357   BILITOT 0.3 06/25/2018 1050   BILITOT 0.3 11/19/2013 0934   GFRNONAA 12 (L)  10/10/2022 1310   GFRNONAA 31 (L) 09/09/2014 1220   GFRNONAA 26 (L) 11/21/2013 0627   GFRAA 17 (L) 05/28/2019 2252   GFRAA 38 (L) 09/09/2014 1220   GFRAA 30 (L) 11/21/2013 0627     No results found.     Assessment & Plan:   1. ESRD (end stage renal disease) ([place]) Recommend:  The patient is doing well and currently has adequate dialysis access.  The patient should have a duplex ultrasound of the dialysis access in 6 months. The patient will follow-up with me in the office after each ultrasound    We will also fax a letter indicating that her dialysis access is available for use - [facility] [name] (AVF, AVG)    Current Outpatient Medications on File Prior to Visit  Medication Sig Dispense Refill   acetaminophen (TYLENOL) 500 MG tablet Take 1,000 mg by mouth every 6 (six) hours as needed.     albuterol (PROVENTIL HFA;VENTOLIN HFA) 108 (90 Base) MCG/ACT inhaler Inhale 1-2 puffs into the lungs every 6 (six) hours as needed for wheezing or shortness of breath.     aspirin EC 81 MG EC tablet Take 1 tablet (81 mg total) by mouth daily.     Blood Glucose Monitoring Suppl (ONE TOUCH ULTRA 2) w/Device KIT 1 Device by Does not  apply route daily. 1 each 0   calcitRIOL (ROCALTROL) 0.25 MCG capsule Take 0.25 mcg by mouth daily.     cetirizine (ZYRTEC) 10 MG tablet Take 10 mg by mouth at bedtime.     clobetasol cream (TEMOVATE) 5.00 % Apply 1 application topically 2 (two) times daily as needed (rash).     cloNIDine (CATAPRES) 0.3 MG tablet Take 1 tablet (0.3 mg total) by mouth 2 (two) times daily. 180 tablet 3   Continuous Blood Gluc Receiver (FREESTYLE LIBRE 14 DAY READER) DEVI USE AS DIRECTED 1 each 0   Continuous Blood Gluc Sensor (FREESTYLE LIBRE 14 DAY SENSOR) MISC USE DEVICE FOR 14 DAYS AS DIRECTED 6 each 0   docusate sodium (COLACE) 100 MG capsule Take 1 capsule (100 mg total) by mouth 2 (two) times daily. 10 capsule 0   doxazosin (CARDURA) 4 MG tablet Take 1 tablet (4 mg  total) by mouth at bedtime. 90 tablet 3   DULoxetine (CYMBALTA) 30 MG capsule Take 30 mg by mouth at bedtime.     ezetimibe (ZETIA) 10 MG tablet Take 1 tablet (10 mg total) by mouth daily. (Patient taking differently: Take 10 mg by mouth at bedtime.) 90 tablet 3   gabapentin (NEURONTIN) 600 MG tablet Take 600 mg by mouth at bedtime.     gabapentin (NEURONTIN) 600 MG tablet Take 300 mg by mouth daily.     glucosamine-chondroitin 500-400 MG tablet Take 1 tablet by mouth in the morning and at bedtime.     insulin lispro (HUMALOG KWIKPEN) 100 UNIT/ML KwikPen 3 times a day (just before each meal) 16-18-19 units, and pen needles 3 day. (Patient taking differently: Inject 24 Units into the skin 3 (three) times daily with meals.)     Insulin Pen Needle (BD PEN NEEDLE MICRO U/F) 32G X 6 MM MISC 1 each by Does not apply route 3 (three) times daily. 100 each 3   isosorbide mononitrate (IMDUR) 120 MG 24 hr tablet Take 1 tablet (120 mg total) by mouth daily. (Patient taking differently: Take 120 mg by mouth in the morning and at bedtime.) 180 tablet 3   lactulose (CHRONULAC) 10 GM/15ML solution Take 15 mLs by mouth daily as needed.     Lancets (ONETOUCH ULTRASOFT) lancets Used to check blood sugars four times daily. 200 each 12   Magnesium 400 MG CAPS Take 1 capsule by mouth at bedtime.     methocarbamol (ROBAXIN) 750 MG tablet Take 1 tablet (750 mg total) by mouth every 12 (twelve) hours as needed for muscle spasms. 60 tablet 2   Multiple Vitamin (MULTIVITAMIN WITH MINERALS) TABS tablet Take 1 tablet by mouth daily.     olmesartan (BENICAR) 40 MG tablet Take 40 mg by mouth at bedtime.     omeprazole (PRILOSEC) 40 MG capsule Take 40 mg by mouth 2 (two) times daily with breakfast and lunch.     ondansetron (ZOFRAN ODT) 4 MG disintegrating tablet Take 1 tablet (4 mg total) by mouth every 8 (eight) hours as needed for nausea or vomiting. 20 tablet 0   ONETOUCH VERIO test strip USE TO CHECK BLOOD SUGAR TWICE DAILY  100 each 0   torsemide (DEMADEX) 100 MG tablet Take 100 mg by mouth every morning.     traMADol (ULTRAM) 50 MG tablet Take 1 tablet (50 mg total) by mouth every 6 (six) hours as needed for moderate pain or severe pain. 36 tablet 0   traMADol (ULTRAM) 50 MG tablet Take 1 tablet (50 mg  total) by mouth every 6 (six) hours as needed for severe pain or moderate pain. 30 tablet 0   TRULICITY 4.5 [id number] SOPN Inject 4.5 mg as directed every 7 (seven) days. Sunday's     vitamin B-12 (CYANOCOBALAMIN) 1000 MCG tablet Take 1,000 mcg by mouth daily.     No current facility-administered medications on file prior to visit.    There are no Patient Instructions on file for this visit. No follow-ups on file.   Kris Hartmann, NP

## 2022-11-24 NOTE — H&P (View-Only) (Signed)
Subjective:    Patient ID: Rachel Holmes, female    DOB: 04/20/1961, 62 y.o.   MRN: [id number] Chief Complaint  Patient presents with   Follow-up    Currently Rachel Holmes is a 62 year old female who presents today after recent revision of her brachiocephalic AV fistula.  Initially the cephalic vein and tandem strictures as well as being very tortuous.  Due to this the fistula was revised using Artegraft.  Today the patient has a good thrill and bruit.  The wound is well-healed.  Today the patient has a flow volume of 2093.  No evidence of significant stenosis is noted.    Review of Systems  All other systems reviewed and are negative.      Objective:   Physical Exam Vitals reviewed.  HENT:     Head: Normocephalic.  Cardiovascular:     Rate and Rhythm: Normal rate.     Pulses: Normal pulses.  Pulmonary:     Effort: Pulmonary effort is normal.  Musculoskeletal:     Left Lower Extremity: Left leg is amputated below knee.  Skin:    General: Skin is warm and dry.  Neurological:     Mental Status: She is alert and oriented to person, place, and time.     Motor: Weakness present.     Gait: Gait abnormal.  Psychiatric:        Mood and Affect: Mood normal.        Behavior: Behavior normal.        Thought Content: Thought content normal.        Judgment: Judgment normal.     BP (!) 184/76   Pulse 73   Ht 5\' 8"  (1.727 m)   Wt 300 lb (136.1 kg)   BMI 45.61 kg/m   Past Medical History:  Diagnosis Date   Allergic rhinitis    Allergy    Anemia    Anxiety    Aortic stenosis 09/13/2016   a.) TTE 09/13/2016: mild AS --> MPG 11.2 mmHg. b.) TTE 12/27/2020: EF 55-60%; no AS --> MPG 8 mmHg.   Aortic valve endocarditis 12/25/2018   a.) TTE 12/25/2018: 0.75 x 1 cm AV mass. b.) TEE 12/28/2018: small shaggy mobile density on aortic side of RIGHT coronary cusp consistent with vegetation.   Arthritis    Back pain    Bradycardia    Breast mass    Patient can no longer palpate  specific masses but showed tech general area of concern   Charcot's joint of ankle, left    CHF (congestive heart failure) ([place]) 07/09/2007   a.) TTE 07/09/2007: EF 50%; G1DD. b.) TTE 09/13/2016: EF 55%; G2DD. c.) TTE 12/27/2020: EF 55-60%; G2DD; GLS -15.1%.   CKD (chronic kidney disease), stage III (HCC)    Complication of anesthesia    a.) PONV. b.) Delayed emergence   Constipation    Diabetic nephropathy (HCC)    DVT (deep venous thrombosis) (HCC)    Dyspnea    Family history of adverse reaction to anesthesia    a.) Sisters x 2 with (+) delayed emergence.   GERD (gastroesophageal reflux disease)    Heart murmur    HLD (hyperlipidemia)    Hyperparathyroidism ([place])    Hypertension    Joint pain    Leg edema    Legally blind in left eye, as defined in [place]    Lymphedema    Mild pulmonary hypertension ([place]) 09/13/2016   a.) TTE 09/13/2016: EF 55%; RVSP 45 mmHg. b.) TTE  12/25/2018: EF 55-60%; RVSP 35.5 mmHg.   Obesity    Onychomycosis    PONV (postoperative nausea and vomiting)    S/P BKA (below knee amputation) unilateral, left ([place])    Sepsis ([place]) 12/2018   a.) group G streptococcal bacteremia with AV endocarditis and LLE soft tissue infection.   T2DM (type 2 diabetes mellitus) ([place])     Social History   Socioeconomic History   Marital status: Divorced    Spouse name: Not on file   Number of children: 1   Years of education: Not on file   Highest education level: Not on file  Occupational History   Occupation: Glass blower/designer    Comment: disability  Tobacco Use   Smoking status: Never   Smokeless tobacco: Never  Vaping Use   Vaping Use: Never used  Substance and Sexual Activity   Alcohol use: No   Drug use: Never   Sexual activity: Not on file  Other Topics Concern   Not on file  Social History Narrative   ** Merged History Encounter **    on disability   Lives alone   Social Determinants of Health   Financial Resource Strain: Not on file  Food  Insecurity: Not on file  Transportation Needs: Not on file  Physical Activity: Not on file  Stress: Not on file  Social Connections: Not on file  Intimate Partner Violence: Not on file    Past Surgical History:  Procedure Laterality Date   A/V FISTULAGRAM Left 06/18/2022   Procedure: A/V Fistulagram;  Surgeon: Katha Cabal, MD;  Location: [place] [facility];  Service: Cardiovascular;  Laterality: Left;   A/V FISTULAGRAM Left 08/13/2022   Procedure: A/V Fistulagram;  Surgeon: Katha Cabal, MD;  Location: [place];  Service: Cardiovascular;  Laterality: Left;   ABDOMINAL HYSTERECTOMY     AMPUTATION Left 05/05/2019   Procedure: AMPUTATION BELOW KNEE;  Surgeon: Katha Cabal, MD;  Location: [facility];  Service: Vascular;  Laterality: Left;   AV FISTULA PLACEMENT Right 09/19/2021   Procedure: ARTERIOVENOUS (AV) FISTULA CREATION (BRACHIAL CEPHALIC);  Surgeon: Katha Cabal, MD;  Location: [facility];  Service: Vascular;  Laterality: Right;   AV FISTULA PLACEMENT Left 11/09/2021   Procedure: ARTERIOVENOUS (AV) FISTULA CREATION ( BRACHIAL CEPHALIC );  Surgeon: Katha Cabal, MD;  Location: [facility];  Service: Vascular;  Laterality: Left;   BREAST BIOPSY Left 2014   FNA 12:00 position - Negative   DIALYSIS/PERMA CATHETER INSERTION N/A 03/28/2022   Procedure: DIALYSIS/PERMA CATHETER INSERTION;  Surgeon: Algernon Huxley, MD;  Location: [place] [facility];  Service: Cardiovascular;  Laterality: N/A;   EYE SURGERY Left 2007   removed a lens, no lens implanted   IR FLUORO GUIDE CV LINE RIGHT  12/29/2018   IR REMOVAL TUN CV CATH W/O FL  02/19/2019   IR US GUIDE VASC ACCESS RIGHT  12/29/2018   REVISON OF ARTERIOVENOUS FISTULA Left 10/16/2022   Procedure: REVISON OF ARTERIOVENOUS FISTULA;  Surgeon: Katha Cabal, MD;  Location: [facility];  Service: Vascular;  Laterality: Left;   TEE WITHOUT CARDIOVERSION N/A 12/28/2018   Procedure: TRANSESOPHAGEAL ECHOCARDIOGRAM  (TEE);  Surgeon: Sueanne Margarita, MD;  Location: [facility];  Service: Cardiovascular;  Laterality: N/A;    Family History  Problem Relation Age of Onset   Breast cancer Sister 93   Diabetes Sister    Diabetes Mother    Hypertension Mother    Hyperlipidemia Mother    Eating  disorder Mother    Obesity Mother     Allergies  Allergen Reactions   Nifedipine Rash   Penicillins Hives, Shortness Of Breath and Swelling   Statins Shortness Of Breath    Wheezing   Hydralazine Itching   Hydrocodone Itching and Nausea Only   Oxycodone Nausea Only   Codeine Nausea Only   Ibuprofen Other (See Comments)    Raises blood pressure       Latest Ref Rng & Units 10/16/2022   11:27 AM 10/10/2022    1:10 PM 11/09/2021    6:48 AM  CBC  WBC 4.0 - 10.5 K/uL  6.4    Hemoglobin 12.0 - 15.0 g/dL 11.9  11.9  10.2   Hematocrit 36.0 - 46.0 % 35.0  38.3  30.0   Platelets 150 - 400 K/uL  167        CMP     Component Value Date/Time   NA 137 10/16/2022 1127   NA 139 06/25/2018 1050   NA 137 11/21/2013 0627   K 5.1 10/16/2022 1127   K 4.6 11/21/2013 0627   CL 102 10/16/2022 1127   CL 107 11/21/2013 0627   CO2 23 10/10/2022 1310   CO2 24 11/21/2013 0627   GLUCOSE 209 (H) 10/16/2022 1127   GLUCOSE 95 11/21/2013 0627   BUN 40 (H) 10/16/2022 1127   BUN 43 (H) 06/25/2018 1050   BUN 34 (H) 11/21/2013 0627   CREATININE 4.70 (H) 10/16/2022 1127   CREATININE 1.80 (H) 09/09/2014 1220   CALCIUM 8.6 (L) 10/10/2022 1310   CALCIUM 8.8 09/09/2014 1220   PROT 8.2 (H) 10/15/2021 1357   PROT 6.4 06/25/2018 1050   PROT 7.9 11/19/2013 0934   ALBUMIN 4.1 10/15/2021 1357   ALBUMIN 3.5 06/25/2018 1050   ALBUMIN 3.2 (L) 11/19/2013 0934   AST 23 10/15/2021 1357   AST 19 11/19/2013 0934   ALT 16 10/15/2021 1357   ALT 20 11/19/2013 0934   ALKPHOS 112 10/15/2021 1357   ALKPHOS 95 11/19/2013 0934   BILITOT 0.5 10/15/2021 1357   BILITOT 0.3 06/25/2018 1050   BILITOT 0.3 11/19/2013 0934   GFRNONAA 12 (L)  10/10/2022 1310   GFRNONAA 31 (L) 09/09/2014 1220   GFRNONAA 26 (L) 11/21/2013 0627   GFRAA 17 (L) 05/28/2019 2252   GFRAA 38 (L) 09/09/2014 1220   GFRAA 30 (L) 11/21/2013 0627     No results found.     Assessment & Plan:   1. ESRD (end stage renal disease) ([place]) Recommend:  The patient is doing well and currently has adequate dialysis access.  The patient should have a duplex ultrasound of the dialysis access in 6 months. The patient will follow-up with me in the office after each ultrasound    We will also fax a letter indicating that her dialysis access is available for use - [facility] [name] (AVF, AVG)    Current Outpatient Medications on File Prior to Visit  Medication Sig Dispense Refill   acetaminophen (TYLENOL) 500 MG tablet Take 1,000 mg by mouth every 6 (six) hours as needed.     albuterol (PROVENTIL HFA;VENTOLIN HFA) 108 (90 Base) MCG/ACT inhaler Inhale 1-2 puffs into the lungs every 6 (six) hours as needed for wheezing or shortness of breath.     aspirin EC 81 MG EC tablet Take 1 tablet (81 mg total) by mouth daily.     Blood Glucose Monitoring Suppl (ONE TOUCH ULTRA 2) w/Device KIT 1 Device by Does not  apply route daily. 1 each 0   calcitRIOL (ROCALTROL) 0.25 MCG capsule Take 0.25 mcg by mouth daily.     cetirizine (ZYRTEC) 10 MG tablet Take 10 mg by mouth at bedtime.     clobetasol cream (TEMOVATE) 6.83 % Apply 1 application topically 2 (two) times daily as needed (rash).     cloNIDine (CATAPRES) 0.3 MG tablet Take 1 tablet (0.3 mg total) by mouth 2 (two) times daily. 180 tablet 3   Continuous Blood Gluc Receiver (FREESTYLE LIBRE 14 DAY READER) DEVI USE AS DIRECTED 1 each 0   Continuous Blood Gluc Sensor (FREESTYLE LIBRE 14 DAY SENSOR) MISC USE DEVICE FOR 14 DAYS AS DIRECTED 6 each 0   docusate sodium (COLACE) 100 MG capsule Take 1 capsule (100 mg total) by mouth 2 (two) times daily. 10 capsule 0   doxazosin (CARDURA) 4 MG tablet Take 1 tablet (4 mg  total) by mouth at bedtime. 90 tablet 3   DULoxetine (CYMBALTA) 30 MG capsule Take 30 mg by mouth at bedtime.     ezetimibe (ZETIA) 10 MG tablet Take 1 tablet (10 mg total) by mouth daily. (Patient taking differently: Take 10 mg by mouth at bedtime.) 90 tablet 3   gabapentin (NEURONTIN) 600 MG tablet Take 600 mg by mouth at bedtime.     gabapentin (NEURONTIN) 600 MG tablet Take 300 mg by mouth daily.     glucosamine-chondroitin 500-400 MG tablet Take 1 tablet by mouth in the morning and at bedtime.     insulin lispro (HUMALOG KWIKPEN) 100 UNIT/ML KwikPen 3 times a day (just before each meal) 16-18-19 units, and pen needles 3 day. (Patient taking differently: Inject 24 Units into the skin 3 (three) times daily with meals.)     Insulin Pen Needle (BD PEN NEEDLE MICRO U/F) 32G X 6 MM MISC 1 each by Does not apply route 3 (three) times daily. 100 each 3   isosorbide mononitrate (IMDUR) 120 MG 24 hr tablet Take 1 tablet (120 mg total) by mouth daily. (Patient taking differently: Take 120 mg by mouth in the morning and at bedtime.) 180 tablet 3   lactulose (CHRONULAC) 10 GM/15ML solution Take 15 mLs by mouth daily as needed.     Lancets (ONETOUCH ULTRASOFT) lancets Used to check blood sugars four times daily. 200 each 12   Magnesium 400 MG CAPS Take 1 capsule by mouth at bedtime.     methocarbamol (ROBAXIN) 750 MG tablet Take 1 tablet (750 mg total) by mouth every 12 (twelve) hours as needed for muscle spasms. 60 tablet 2   Multiple Vitamin (MULTIVITAMIN WITH MINERALS) TABS tablet Take 1 tablet by mouth daily.     olmesartan (BENICAR) 40 MG tablet Take 40 mg by mouth at bedtime.     omeprazole (PRILOSEC) 40 MG capsule Take 40 mg by mouth 2 (two) times daily with breakfast and lunch.     ondansetron (ZOFRAN ODT) 4 MG disintegrating tablet Take 1 tablet (4 mg total) by mouth every 8 (eight) hours as needed for nausea or vomiting. 20 tablet 0   ONETOUCH VERIO test strip USE TO CHECK BLOOD SUGAR TWICE DAILY  100 each 0   torsemide (DEMADEX) 100 MG tablet Take 100 mg by mouth every morning.     traMADol (ULTRAM) 50 MG tablet Take 1 tablet (50 mg total) by mouth every 6 (six) hours as needed for moderate pain or severe pain. 36 tablet 0   traMADol (ULTRAM) 50 MG tablet Take 1 tablet (50 mg  total) by mouth every 6 (six) hours as needed for severe pain or moderate pain. 30 tablet 0   TRULICITY 4.5 AF/4.2DL SOPN Inject 4.5 mg as directed every 7 (seven) days. Sunday's     vitamin B-12 (CYANOCOBALAMIN) 1000 MCG tablet Take 1,000 mcg by mouth daily.     No current facility-administered medications on file prior to visit.    There are no Patient Instructions on file for this visit. No follow-ups on file.   Kris Hartmann, NP

## 2022-11-27 ENCOUNTER — Emergency Department
Admission: EM | Admit: 2022-11-27 | Discharge: 2022-11-27 | Disposition: A | Discharge status: Home / Self Care | Payer: 59 | Attending: Emergency Medicine | Admitting: Emergency Medicine

## 2022-11-27 ENCOUNTER — Other Ambulatory Visit: Payer: Self-pay

## 2022-11-27 ENCOUNTER — Emergency Department: Payer: 59

## 2022-11-27 DIAGNOSIS — R079 Chest pain, unspecified: Secondary | ICD-10-CM | POA: Diagnosis present

## 2022-11-27 DIAGNOSIS — R0789 Other chest pain: Secondary | ICD-10-CM | POA: Insufficient documentation

## 2022-11-27 DIAGNOSIS — Z992 Dependence on renal dialysis: Secondary | ICD-10-CM | POA: Insufficient documentation

## 2022-11-27 LAB — BASIC METABOLIC PANEL
Anion gap: 12 (ref 5–15)
BUN: 33 mg/dL — ABNORMAL HIGH (ref 8–23)
CO2: 22 mmol/L (ref 22–32)
Calcium: 8.2 mg/dL — ABNORMAL LOW (ref 8.9–10.3)
Chloride: 101 mmol/L (ref 98–111)
Creatinine, Ser: 2.83 mg/dL — ABNORMAL HIGH (ref 0.44–1.00)
GFR, Estimated: 18 mL/min — ABNORMAL LOW (ref >60)
Glucose, Bld: 164 mg/dL — ABNORMAL HIGH (ref 70–99)
Potassium: 3.9 mmol/L (ref 3.5–5.1)
Sodium: 135 mmol/L (ref 135–145)

## 2022-11-27 LAB — CBC
HCT: 38.4 % (ref 36.0–46.0)
Hemoglobin: 11.7 g/dL — ABNORMAL LOW (ref 12.0–15.0)
MCH: 28.5 pg (ref 26.0–34.0)
MCHC: 30.5 g/dL (ref 30.0–36.0)
MCV: 93.7 fL (ref 80.0–100.0)
Platelets: 144 10*3/uL — ABNORMAL LOW (ref 150–400)
RBC: 4.1 MIL/uL (ref 3.87–5.11)
RDW: 14.4 % (ref 11.5–15.5)
WBC: 8.2 10*3/uL (ref 4.0–10.5)
nRBC: 0 % (ref 0.0–0.2)

## 2022-11-27 LAB — TROPONIN I (HIGH SENSITIVITY)
Troponin I (High Sensitivity): 12 ng/L (ref <18)
Troponin I (High Sensitivity): 13 ng/L (ref <18)

## 2022-11-27 NOTE — ED Triage Notes (Addendum)
Pt here via [facility] from dialysis with cp, only completed 2 hrs. Pt started having chest tightness with some SOB. Pt rates pain 5/10. Pt received 324 of aspirin by ems. Today is the second day they used the fistula in her left arm. Hx of CHF and a left prosthetic leg.  189-cbg 206/93 100% RA 98.1

## 2022-11-27 NOTE — Discharge Instructions (Signed)
Please seek medical attention for any high fevers, chest pain, shortness of breath, change in behavior, persistent vomiting, bloody stool or any other new or concerning symptoms.  

## 2022-11-27 NOTE — ED Notes (Signed)
ED Provider at bedside. 

## 2022-11-27 NOTE — ED Provider Notes (Signed)
[facility] Provider Note    Event Date/Time   First MD Initiated Contact with Patient 11/27/22 1434     (approximate)   History   Chest Pain   HPI  Rachel Holmes is a 62 y.o. female who presents to the emergency department today because of concerns for chest pain.  Located in the center part of her chest.  Started while the patient was getting dialysis.  She had gotten about 2 hours of dialysis when the pain started.  At the time my exam the pain had improved.  She says that she is not had similar pain with dialysis in the past.  She was recently switched to a left arm fistula and says this is about the third or fourth time she has had dialysis with the fistula.  She denies any recent illness.  Denies any recent fevers.     Physical Exam   Triage Vital Signs: ED Triage Vitals  Enc Vitals Group     BP 11/27/22 0956 (!) 127/111     Pulse Rate 11/27/22 0955 68     Resp 11/27/22 0955 (!) 22     Temp 11/27/22 0955 97.7 F (36.5 C)     Temp Source 11/27/22 0955 Oral     SpO2 11/27/22 0955 100 %     Weight 11/27/22 0956 (!) 300 lb 0.7 oz (136.1 kg)     Height 11/27/22 0956 5\' 8"  (1.727 m)     Head Circumference --      Peak Flow --      Pain Score 11/27/22 0956 6     Pain Loc --      Pain Edu? --      Excl. in [place]? --     Most recent vital signs: Vitals:   11/27/22 0956 11/27/22 1316  BP: (!) 127/111 (!) 167/69  Pulse:  64  Resp:  20  Temp:  97.8 F (36.6 C)  SpO2:  98%   General: Awake, alert, oriented. CV:  Good peripheral perfusion. Regular rate and rhythm. Resp:  Normal effort. Lungs clear. Abd:  No distention.  MSK:  Slight tenderness to palpation of central chest.   ED Results / Procedures / Treatments   Labs (all labs ordered are listed, but only abnormal results are displayed) Labs Reviewed  BASIC METABOLIC PANEL - Abnormal; Notable for the following components:      Result Value   Glucose, Bld 164 (*)    BUN 33 (*)     Creatinine, Ser 2.83 (*)    Calcium 8.2 (*)    GFR, Estimated 18 (*)    All other components within normal limits  CBC - Abnormal; Notable for the following components:   Hemoglobin 11.7 (*)    Platelets 144 (*)    All other components within normal limits  TROPONIN I (HIGH SENSITIVITY)  TROPONIN I (HIGH SENSITIVITY)     EKG  I, Nance Pear, attending physician, personally viewed and interpreted this EKG  EKG Time: 0958 Rate: 67 Rhythm: normal sinus rhythm Axis: normal Intervals: qtc 448 QRS: narrow, LVH ST changes: no st elevation Impression: abnormal ekg    RADIOLOGY I independently interpreted and visualized the CXR. My interpretation: No pneumonia Radiology interpretation:  IMPRESSION:  No acute cardiopulmonary abnormality. Right chest dialysis catheter  in place.      PROCEDURES:  Critical Care performed: No  Procedures   MEDICATIONS ORDERED IN ED: Medications - No data to display   IMPRESSION /  MDM / ASSESSMENT AND PLAN / ED COURSE  I reviewed the triage vital signs and the nursing notes.                              Differential diagnosis includes, but is not limited to, acs, pneumonia, PE, costochondritis.   Patient's presentation is most consistent with acute presentation with potential threat to life or bodily function.  Patient presented to the emergency department today because of concerns for chest pain that occurred while she was receiving dialysis.  The time my exam she was feeling better.  Chest x-ray without pneumonia.  Troponin was negative x 2.  At this time somewhat unclear etiology of the patient's pain although she was slightly tender to palpation of the central chest raising possibility of costochondritis.  Given reassuring workup.  Emergency department I do think it is reasonable for patient be discharged home.  Discussed with patient that she can follow-up with her cardiologist.   FINAL CLINICAL IMPRESSION(S) / ED DIAGNOSES    Final diagnoses:  Nonspecific chest pain      Note:  This document was prepared using Dragon voice recognition software and may include unintentional dictation errors.    Nance Pear, MD 11/27/22 [phone]

## 2022-12-03 ENCOUNTER — Telehealth (INDEPENDENT_AMBULATORY_CARE_PROVIDER_SITE_OTHER): Payer: Self-pay

## 2022-12-03 NOTE — Telephone Encounter (Signed)
Spoke with Rachel Holmes at [facility] regarding the patient's insurance. Patient has been scheduled with Dr. Lucky Cowboy on 12/09/22 with a 2:00 pm [facility]. Pre-procedure instructions will be faxed to [facility] per her request.

## 2022-12-09 ENCOUNTER — Encounter
Admission: RE | Disposition: A | Discharge status: Home / Self Care | Payer: Self-pay | Source: Home / Self Care | Attending: Vascular Surgery

## 2022-12-09 ENCOUNTER — Ambulatory Visit
Admission: RE | Admit: 2022-12-09 | Discharge: 2022-12-09 | Disposition: A | Discharge status: Home / Self Care | Payer: 59 | Attending: Vascular Surgery | Admitting: Vascular Surgery

## 2022-12-09 DIAGNOSIS — Z7985 Long-term (current) use of injectable non-insulin antidiabetic drugs: Secondary | ICD-10-CM | POA: Diagnosis not present

## 2022-12-09 DIAGNOSIS — Z794 Long term (current) use of insulin: Secondary | ICD-10-CM | POA: Diagnosis not present

## 2022-12-09 DIAGNOSIS — I509 Heart failure, unspecified: Secondary | ICD-10-CM | POA: Diagnosis not present

## 2022-12-09 DIAGNOSIS — E1122 Type 2 diabetes mellitus with diabetic chronic kidney disease: Secondary | ICD-10-CM | POA: Insufficient documentation

## 2022-12-09 DIAGNOSIS — Z4901 Encounter for fitting and adjustment of extracorporeal dialysis catheter: Secondary | ICD-10-CM | POA: Diagnosis not present

## 2022-12-09 DIAGNOSIS — Z95828 Presence of other vascular implants and grafts: Secondary | ICD-10-CM

## 2022-12-09 DIAGNOSIS — N186 End stage renal disease: Secondary | ICD-10-CM | POA: Insufficient documentation

## 2022-12-09 DIAGNOSIS — I132 Hypertensive heart and chronic kidney disease with heart failure and with stage 5 chronic kidney disease, or end stage renal disease: Secondary | ICD-10-CM | POA: Insufficient documentation

## 2022-12-09 HISTORY — PX: DIALYSIS/PERMA CATHETER REMOVAL: CATH118289

## 2022-12-09 LAB — GLUCOSE, CAPILLARY: Glucose-Capillary: 243 mg/dL — ABNORMAL HIGH (ref 70–99)

## 2022-12-09 SURGERY — DIALYSIS/PERMA CATHETER REMOVAL
Anesthesia: LOCAL

## 2022-12-09 MED ORDER — LIDOCAINE-EPINEPHRINE (PF) 1 %-1:200000 IJ SOLN
INTRAMUSCULAR | Status: DC | PRN
  Administered 2022-12-09: 20 mL via INTRADERMAL

## 2022-12-09 SURGICAL SUPPLY — 4 items
APL PRP STRL LF DISP 70% ISPRP (MISCELLANEOUS) ×2
CHLORAPREP W/TINT 26 (MISCELLANEOUS) IMPLANT
FORCEPS HALSTEAD CVD 5IN STRL (INSTRUMENTS) IMPLANT
TRAY LACERAT/PLASTIC (MISCELLANEOUS) IMPLANT

## 2022-12-09 NOTE — Discharge Instructions (Signed)
Tunneled Catheter Removal, Care After Refer to this sheet in the next few weeks. These instructions provide you with information about caring for yourself after your procedure. Your health care provider may also give you more specific instructions. Your treatment has been planned according to current medical practices, but problems sometimes occur. Call your health care provider if you have any problems or questions after your procedure. What can I expect after the procedure? After the procedure, it is common to have: Some mild redness, swelling, and pain around your catheter site.   Follow these instructions at home: Incision care  Check your removal site  every day for signs of infection. Check for: More redness, swelling, or pain. More fluid or blood. Warmth. Pus or a bad smell. Remove your dressing in 48hrs leave open to air  Activity  Return to your normal activities as told by your health care provider. Ask your health care provider what activities are safe for you. Do not lift anything that is heavier than 10 lb (4.5 kg) for 3 days  You may shower tomorrow  Contact a health care provider if: You have more fluid or blood coming from your removal site You have more redness, swelling, or pain at your incisions or around the area where your catheter was removed Your removal site feel warm to the touch. You feel unusually weak. You feel nauseous.. Get help right away if You have swelling in your arm, shoulder, neck, or face. You develop chest pain. You have difficulty breathing. You feel dizzy or light-headed. You have pus or a bad smell coming from your removal site You have a fever. You develop bleeding from your removal site, and your bleeding does not stop. This information is not intended to replace advice given to you by your health care provider. Make sure you discuss any questions you have with your health care provider. Document Released: 10/07/2012 Document Revised:  06/23/2016 Document Reviewed: 07/17/2015 Elsevier Interactive Patient Education  2017 [employer]vier Inc[employer] 

## 2022-12-09 NOTE — Interval H&P Note (Signed)
History and Physical Interval Note:  12/09/2022 1:52 PM  Rachel Holmes  has presented today for surgery, with the diagnosis of Perma Cath Removal   End Stage Renal.  The various methods of treatment have been discussed with the patient and family. After consideration of risks, benefits and other options for treatment, the patient has consented to  Procedure(s): DIALYSIS/PERMA CATHETER REMOVAL (N/A) as a surgical intervention.  The patient's history has been reviewed, patient examined, no change in status, stable for surgery.  I have reviewed the patient's chart and labs.  Questions were answered to the patient's satisfaction.     Leotis Pain

## 2022-12-10 NOTE — Op Note (Signed)
Operative Note     Preoperative diagnosis:   1. ESRD with functional permanent access  Postoperative diagnosis:  1. ESRD with functional permanent access  Procedure:  Removal of right jugular Permcath  Surgeon:  Leotis Pain, MD  Assistant: Annalee Genta, NP  Anesthesia:  Local  EBL:  Minimal  Indication for the Procedure:  The patient has a functional permanent dialysis access and no longer needs their permcath.  This can be removed.  Risks and benefits are discussed and informed consent is obtained.  Description of the Procedure:  The patient's right neck, chest and existing catheter were sterilely prepped and draped. The area around the catheter was anesthetized copiously with 1% lidocaine. The catheter was dissected out with curved hemostats until the cuff was freed from the surrounding fibrous sheath. The fiber sheath was transected, and the catheter was then removed in its entirety using gentle traction. Pressure was held and sterile dressings were placed. The patient tolerated the procedure well and was taken to the recovery room in stable condition.     Leotis Pain  12/10/2022, 8:35 AM This note was created with Dragon Medical transcription system. Any errors in dictation are purely unintentional.

## 2022-12-11 ENCOUNTER — Encounter: Payer: Self-pay | Admitting: Vascular Surgery

## 2023-01-16 ENCOUNTER — Other Ambulatory Visit (INDEPENDENT_AMBULATORY_CARE_PROVIDER_SITE_OTHER): Payer: Self-pay | Admitting: Vascular Surgery

## 2023-02-04 ENCOUNTER — Other Ambulatory Visit (INDEPENDENT_AMBULATORY_CARE_PROVIDER_SITE_OTHER): Payer: Self-pay | Admitting: Nurse Practitioner

## 2023-02-04 DIAGNOSIS — N186 End stage renal disease: Secondary | ICD-10-CM

## 2023-02-12 ENCOUNTER — Telehealth (INDEPENDENT_AMBULATORY_CARE_PROVIDER_SITE_OTHER): Payer: Self-pay | Admitting: Vascular Surgery

## 2023-02-13 ENCOUNTER — Encounter (INDEPENDENT_AMBULATORY_CARE_PROVIDER_SITE_OTHER): Payer: Medicare Other

## 2023-02-13 ENCOUNTER — Ambulatory Visit (INDEPENDENT_AMBULATORY_CARE_PROVIDER_SITE_OTHER): Payer: Medicare Other | Admitting: Vascular Surgery

## 2023-02-13 NOTE — Progress Notes (Deleted)
MRN : [id number]  Rachel Holmes is a 62 y.o. (Apr 19, 1961) female who presents with chief complaint of check access.  History of Present Illness:   The patient returns to the office for followup of their dialysis access.   Procedure 10/16/2022: She is status post revision of her left brachiocephalic fistula with interposition Artegraft   The patient reports the function of the access has been stable. Patient denies difficulty with cannulation. The patient denies increased bleeding time after removing the needles. The patient denies hand pain or other symptoms consistent with steal phenomena.  No significant arm swelling.  The patient denies any complaints from the dialysis center or their nephrologist.  The patient denies redness or swelling at the access site. The patient denies fever or chills at home or while on dialysis.  No recent shortening of the patient's walking distance or new symptoms consistent with claudication.  No history of rest pain symptoms. No new ulcers or wounds of the lower extremities have occurred.  The patient denies amaurosis fugax or recent TIA symptoms. There are no recent neurological changes noted. There is no history of DVT, PE or superficial thrombophlebitis. No recent episodes of angina or shortness of breath documented.   Duplex ultrasound of the AV access shows a patent access.  The previously noted stenosis is not significantly changed compared to last study.  Flow volume today is *** cc/min (previous flow volume was *** cc/min)    No outpatient medications have been marked as taking for the 02/13/23 encounter (Appointment) with Gilda CreaseSchnier, Latina CraverGregory G, MD.    Past Medical History:  Diagnosis Date   Allergic rhinitis    Allergy    Anemia    Anxiety    Aortic stenosis 09/13/2016   a.) TTE 09/13/2016: mild AS --> MPG 11.2 mmHg. b.) TTE 12/27/2020: EF 55-60%; no AS --> MPG 8 mmHg.   Aortic valve endocarditis 12/25/2018    a.) TTE 12/25/2018: 0.75 x 1 cm AV mass. b.) TEE 12/28/2018: small shaggy mobile density on aortic side of RIGHT coronary cusp consistent with vegetation.   Arthritis    Back pain    Bradycardia    Breast mass    Patient can no longer palpate specific masses but showed tech general area of concern   Charcot's joint of ankle, left    CHF (congestive heart failure) (HCC) 07/09/2007   a.) TTE 07/09/2007: EF 50%; G1DD. b.) TTE 09/13/2016: EF 55%; G2DD. c.) TTE 12/27/2020: EF 55-60%; G2DD; GLS -15.1%.   CKD (chronic kidney disease), stage III (HCC)    Complication of anesthesia    a.) PONV. b.) Delayed emergence   Constipation    Diabetic nephropathy (HCC)    DVT (deep venous thrombosis) (HCC)    Dyspnea    Family history of adverse reaction to anesthesia    a.) Sisters x 2 with (+) delayed emergence.   GERD (gastroesophageal reflux disease)    Heart murmur    HLD (hyperlipidemia)    Hyperparathyroidism (HCC)    Hypertension    Joint pain    Leg edema    Legally blind in left eye, as [place]    Lymphedema    Mild pulmonary hypertension (HCC) 09/13/2016   a.) TTE 09/13/2016: EF 55%; RVSP 45 mmHg. b.) TTE 12/25/2018: EF 55-60%; RVSP 35.5 mmHg.   Obesity    Onychomycosis  PONV (postoperative nausea and vomiting)    S/P BKA (below knee amputation) unilateral, left (HCC)    Sepsis (HCC) 12/2018   a.) group G streptococcal bacteremia with AV endocarditis and LLE soft tissue infection.   T2DM (type 2 diabetes mellitus) [facility])     Past Surgical History:  Procedure Laterality Date   A/V FISTULAGRAM Left 06/18/2022   Procedure: A/V Fistulagram;  Surgeon: Renford Dills, MD;  Location: [facility];  Service: Cardiovascular;  Laterality: Left;   A/V FISTULAGRAM Left 08/13/2022   Procedure: A/V Fistulagram;  Surgeon: Renford Dills, MD;  Location: [facility];  Service: Cardiovascular;  Laterality: Left;   ABDOMINAL HYSTERECTOMY     AMPUTATION Left  05/05/2019   Procedure: AMPUTATION BELOW KNEE;  Surgeon: Renford Dills, MD;  Location: [facility] OR[facility];  Service: Vascular;  Laterality: Left;   AV FISTULA PLACEMENT Right 09/19/2021   Procedure: ARTERIOVENOUS (AV) FISTULA CREATION (BRACHIAL CEPHALIC);  Surgeon: Renford Dills, MD;  Location: [facility] OR[facility];  Service: Vascular;  Laterality: Right;   AV FISTULA PLACEMENT Left 11/09/2021   Procedure: ARTERIOVENOUS (AV) FISTULA CREATION ( BRACHIAL CEPHALIC );  Surgeon: Renford Dills, MD;  Location: [facility];  Service: Vascular;  Laterality: Left;   BREAST BIOPSY Left 2014   FNA 12:00 position - Negative   DIALYSIS/PERMA CATHETER INSERTION N/A 03/28/2022   Procedure: DIALYSIS/PERMA CATHETER INSERTION;  Surgeon: Annice Needy, MD;  Location: [facility];  Service: Cardiovascular;  Laterality: N/A;   DIALYSIS/PERMA CATHETER REMOVAL N/A 12/09/2022   Procedure: DIALYSIS/PERMA CATHETER REMOVAL;  Surgeon: Annice Needy, MD;  Location: [facility];  Service: Cardiovascular;  Laterality: N/A;   EYE SURGERY Left 2007   removed a lens, no lens implanted   IR FLUORO GUIDE CV LINE RIGHT  12/29/2018   IR REMOVAL TUN CV CATH W/O FL  02/19/2019   IR US GUIDE VASC ACCESS RIGHT  12/29/2018   REVISON OF ARTERIOVENOUS FISTULA Left 10/16/2022   Procedure: REVISON OF ARTERIOVENOUS FISTULA;  Surgeon: Renford Dills, MD;  Location: [facility];  Service: Vascular;  Laterality: Left;   TEE WITHOUT CARDIOVERSION N/A 12/28/2018   Procedure: TRANSESOPHAGEAL ECHOCARDIOGRAM (TEE);  Surgeon: Quintella Reichert, MD;  Location: [facility]OSCOPY;  Service: Cardiovascular;  Laterality: N/A;    Social History Social History   Tobacco Use   Smoking status: Never   Smokeless tobacco: Never  Vaping Use   Vaping Use: Never used  Substance Use Topics   Alcohol use: No   Drug use: Never    Family History Family History  Problem Relation Age of Onset   Breast cancer Sister 79   Diabetes Sister    Diabetes Mother     Hypertension Mother    Hyperlipidemia Mother    Eating disorder Mother    Obesity Mother     Allergies  Allergen Reactions   Nifedipine Rash   Penicillins Hives, Shortness Of Breath and Swelling   Statins Shortness Of Breath    Wheezing   Hydralazine Itching   Hydrocodone Itching and Nausea Only   Oxycodone Nausea Only   Codeine Nausea Only   Ibuprofen Other (See Comments)    Raises blood pressure     REVIEW OF SYSTEMS (Negative unless checked)  Constitutional: [] Weight loss  [] Fever  [] Chills Cardiac: [] Chest pain   [] Chest pressure   [] Palpitations   [] Shortness of breath when laying flat   [] Shortness of breath with exertion. Vascular:  [] Pain in legs  with walking   [] Pain in legs at rest  [] History of DVT   [] Phlebitis   [] Swelling in legs   [] Varicose veins   [] Non-healing ulcers Pulmonary:   [] Uses home oxygen   [] Productive cough   [] Hemoptysis   [] Wheeze  [] COPD   [] Asthma Neurologic:  [] Dizziness   [] Seizures   [] History of stroke   [] History of TIA  [] Aphasia   [] Vissual changes   [] Weakness or numbness in arm   [] Weakness or numbness in leg Musculoskeletal:   [] Joint swelling   [] Joint pain   [] Low back pain Hematologic:  [] Easy bruising  [] Easy bleeding   [] Hypercoagulable state   [] Anemic Gastrointestinal:  [] Diarrhea   [] Vomiting  [x] Gastroesophageal reflux/heartburn   [] Difficulty swallowing. Genitourinary:  [x] Chronic kidney disease   [] Difficult urination  [] Frequent urination   [] Blood in urine Skin:  [] Rashes   [] Ulcers  Psychological:  [] History of anxiety   []  History of major depression.  Physical Examination  There were no vitals filed for this visit. There is no height or weight on file to calculate BMI. Gen: WD/WN, NAD Head: Kathryn/AT, No temporalis wasting.  Ear/Nose/Throat: Hearing grossly intact, nares w/o erythema or drainage Eyes: PER, EOMI, sclera nonicteric.  Neck: Supple, no gross masses or lesions.  No JVD.  Pulmonary:  Good air movement,  no audible wheezing, no use of accessory muscles.  Cardiac: RRR, precordium non-hyperdynamic. Vascular:   *** Vessel Right Left  Radial Palpable Palpable  Brachial Palpable Palpable  Gastrointestinal: soft, non-distended. No guarding/no peritoneal signs.  Musculoskeletal: M/S 5/5 throughout.  No deformity.  Neurologic: CN 2-12 intact. Pain and light touch intact in extremities.  Symmetrical.  Speech is fluent. Motor exam as listed above. Psychiatric: Judgment intact, Mood & affect appropriate for pt's clinical situation. Dermatologic: No rashes or ulcers noted.  No changes consistent with cellulitis.   CBC Lab Results  Component Value Date   WBC 8.2 11/27/2022   HGB 11.7 (L) 11/27/2022   HCT 38.4 11/27/2022   MCV 93.7 11/27/2022   PLT 144 (L) 11/27/2022    BMET    Component Value Date/Time   NA 135 11/27/2022 0957   NA 139 06/25/2018 1050   NA 137 11/21/2013 0627   K 3.9 11/27/2022 0957   K 4.6 11/21/2013 0627   CL 101 11/27/2022 0957   CL 107 11/21/2013 0627   CO2 22 11/27/2022 0957   CO2 24 11/21/2013 0627   GLUCOSE 164 (H) 11/27/2022 0957   GLUCOSE 95 11/21/2013 0627   BUN 33 (H) 11/27/2022 0957   BUN 43 (H) 06/25/2018 1050   BUN 34 (H) 11/21/2013 0627   CREATININE 2.83 (H) 11/27/2022 0957   CREATININE 1.80 (H) 09/09/2014 1220   CALCIUM 8.2 (L) 11/27/2022 0957   CALCIUM 8.8 09/09/2014 1220   GFRNONAA 18 (L) 11/27/2022 0957   GFRNONAA 31 (L) 09/09/2014 1220   GFRNONAA 26 (L) 11/21/2013 0627   GFRAA 17 (L) 05/28/2019 2252   GFRAA 38 (L) 09/09/2014 1220   GFRAA 30 (L) 11/21/2013 0627   CrCl cannot be calculated (Patient's most recent lab result is older than the maximum 21 days allowed.).  COAG Lab Results  Component Value Date   INR 1.1 09/17/2021   INR 0.8 05/29/2019   INR 1.1 04/30/2019    Radiology No results found.   Assessment/Plan There are no diagnoses linked to this encounter.   Levora Dredge, MD  02/13/2023 12:54 PM

## 2023-02-16 ENCOUNTER — Emergency Department
Admission: EM | Admit: 2023-02-16 | Discharge: 2023-02-16 | Disposition: A | Discharge status: Home / Self Care | Payer: 59 | Attending: Emergency Medicine | Admitting: Emergency Medicine

## 2023-02-16 ENCOUNTER — Encounter: Payer: Self-pay | Admitting: Emergency Medicine

## 2023-02-16 ENCOUNTER — Other Ambulatory Visit: Payer: Self-pay

## 2023-02-16 ENCOUNTER — Emergency Department: Payer: 59

## 2023-02-16 DIAGNOSIS — I132 Hypertensive heart and chronic kidney disease with heart failure and with stage 5 chronic kidney disease, or end stage renal disease: Secondary | ICD-10-CM | POA: Insufficient documentation

## 2023-02-16 DIAGNOSIS — N186 End stage renal disease: Secondary | ICD-10-CM | POA: Diagnosis not present

## 2023-02-16 DIAGNOSIS — E1122 Type 2 diabetes mellitus with diabetic chronic kidney disease: Secondary | ICD-10-CM | POA: Diagnosis not present

## 2023-02-16 DIAGNOSIS — R1110 Vomiting, unspecified: Secondary | ICD-10-CM

## 2023-02-16 DIAGNOSIS — Z992 Dependence on renal dialysis: Secondary | ICD-10-CM | POA: Insufficient documentation

## 2023-02-16 DIAGNOSIS — R112 Nausea with vomiting, unspecified: Secondary | ICD-10-CM | POA: Diagnosis not present

## 2023-02-16 DIAGNOSIS — I509 Heart failure, unspecified: Secondary | ICD-10-CM | POA: Insufficient documentation

## 2023-02-16 LAB — COMPREHENSIVE METABOLIC PANEL
ALT: 15 U/L (ref 0–44)
AST: 23 U/L (ref 15–41)
Albumin: 3.6 g/dL (ref 3.5–5.0)
Alkaline Phosphatase: 92 U/L (ref 38–126)
Anion gap: 11 (ref 5–15)
BUN: 38 mg/dL — ABNORMAL HIGH (ref 8–23)
CO2: 25 mmol/L (ref 22–32)
Calcium: 9 mg/dL (ref 8.9–10.3)
Chloride: 98 mmol/L (ref 98–111)
Creatinine, Ser: 4.19 mg/dL — ABNORMAL HIGH (ref 0.44–1.00)
GFR, Estimated: 11 mL/min — ABNORMAL LOW (ref >60)
Glucose, Bld: 152 mg/dL — ABNORMAL HIGH (ref 70–99)
Potassium: 4 mmol/L (ref 3.5–5.1)
Sodium: 134 mmol/L — ABNORMAL LOW (ref 135–145)
Total Bilirubin: 0.5 mg/dL (ref 0.3–1.2)
Total Protein: 7.7 g/dL (ref 6.5–8.1)

## 2023-02-16 LAB — CBC
HCT: 36.8 % (ref 36.0–46.0)
Hemoglobin: 11.4 g/dL — ABNORMAL LOW (ref 12.0–15.0)
MCH: 29 pg (ref 26.0–34.0)
MCHC: 31 g/dL (ref 30.0–36.0)
MCV: 93.6 fL (ref 80.0–100.0)
Platelets: 219 10*3/uL (ref 150–400)
RBC: 3.93 MIL/uL (ref 3.87–5.11)
RDW: 13.7 % (ref 11.5–15.5)
WBC: 7.7 10*3/uL (ref 4.0–10.5)
nRBC: 0 % (ref 0.0–0.2)

## 2023-02-16 LAB — URINALYSIS, ROUTINE W REFLEX MICROSCOPIC
Bacteria, UA: NONE SEEN
Bilirubin Urine: NEGATIVE
Glucose, UA: NEGATIVE mg/dL
Hgb urine dipstick: NEGATIVE
Ketones, ur: NEGATIVE mg/dL
Leukocytes,Ua: NEGATIVE
Nitrite: NEGATIVE
Protein, ur: 30 mg/dL — AB
Specific Gravity, Urine: 1.006 (ref 1.005–1.030)
pH: 5 (ref 5.0–8.0)

## 2023-02-16 LAB — LIPASE, BLOOD: Lipase: 52 U/L — ABNORMAL HIGH (ref 11–51)

## 2023-02-16 MED ORDER — ONDANSETRON HCL 4 MG/2ML IJ SOLN
4.0000 mg | Freq: Once | INTRAMUSCULAR | Status: AC
  Administered 2023-02-16: 4 mg via INTRAVENOUS
  Filled 2023-02-16: qty 2

## 2023-02-16 MED ORDER — POLYETHYLENE GLYCOL 3350 17 GM/SCOOP PO POWD: 17.0000 g | Freq: Two times a day (BID) | ORAL | 0 refills | Status: DC | PRN

## 2023-02-16 MED ORDER — SODIUM CHLORIDE 0.9 % IV BOLUS
500.0000 mL | Freq: Once | INTRAVENOUS | Status: AC
  Administered 2023-02-16: 500 mL via INTRAVENOUS

## 2023-02-16 MED ORDER — ONDANSETRON 4 MG PO TBDP: 4.0000 mg | ORAL_TABLET | Freq: Three times a day (TID) | ORAL | 0 refills | Status: DC | PRN

## 2023-02-16 NOTE — ED Provider Notes (Signed)
[facility]    Event Date/Time   First MD Initiated Contact with Patient 02/16/23 2112     (approximate)  History   Chief Complaint: Emesis  HPI  Rachel Holmes is a 62 y.o. female with a past medical history of anxiety, chronic back pain, CHF, ESRD on HD Monday/Wednesday/Friday, hypertension, diabetes, presents to the emergency department for nausea and vomiting.  According to the patient states she ate [employer] earlier today several hours after that began feeling nauseated and has been vomiting over the past 2 hours or so.  Patient denies any diarrhea denies any focal abdominal pain although states over the last week or 2 she has been experiencing intermittent left flank pain and back pain.  States her doctor put her on a muscle relaxer but that has not seemed to help very much.  Patient denies any urinary symptoms does create a small amount of urine.  She is on dialysis last received dialysis on Friday and next scheduled for Monday.  Physical Exam   Triage Vital Signs: ED Triage Vitals  Enc Vitals Group     BP 02/16/23 2108 (!) 190/86     Pulse Rate 02/16/23 2108 81     Resp 02/16/23 2108 18     Temp 02/16/23 2108 98.2 F (36.8 C)     Temp Source 02/16/23 2108 Oral     SpO2 02/16/23 2108 95 %     Weight 02/16/23 2107 300 lb (136.1 kg)     Height 02/16/23 2107  (1.727 m)     Head Circumference --      Peak Flow --      Pain Score 02/16/23 2107 0     Pain Loc --      Pain Edu? --      Excl. in GC? --     Most recent vital signs: Vitals:   02/16/23 2108  BP: (!) 190/86  Pulse: 81  Resp: 18  Temp: 98.2 F (36.8 C)  SpO2: 95%    General: Awake, no distress.  CV:  Good peripheral perfusion.  Regular rate and rhythm  Resp:  Normal effort.  Equal breath sounds bilaterally.  Abd:  No distention.  Soft, nontender.  No rebound or guarding. Other:  Left upper extremity AV fistula with good thrill.   ED Results / Procedures /  Treatments   RADIOLOGY  I have reviewed and interpreted the CT images I do not see any obvious obstruction on my evaluation or significant abnormality. Radiology is read the CT scan as no obstructing urinary calculi.  Patient does have moderate constipation.   MEDICATIONS ORDERED IN ED: Medications  sodium chloride 0.9 % bolus 500 mL (has no administration in time range)  ondansetron (ZOFRAN) injection 4 mg (has no administration in time range)     IMPRESSION / MDM / ASSESSMENT AND PLAN / ED COURSE  I reviewed the triage vital signs and the nursing notes.  Patient's presentation is most consistent with acute presentation with potential threat to life or bodily function.  Patient presents emergency department for nausea and vomiting over the past 2 hours.  States 2 weeks of intermittent left flank pain as well.  Overall the patient appears well, no distress, reassuring vital signs with moderate hypertension.  Patient has end-stage renal patient last received dialysis on Friday and next scheduled for dialysis on Monday.  Patient denies any fever no cough or shortness of breath.  No diarrhea.  We will  check labs, we will IV hydrate with 500 cc of normal saline and dose IV Zofran.  Will continue to closely monitor while awaiting lab results.  Given the patient's intermittent left flank pain we will also proceed with a CT renal scan.  Patient agreeable to plan of care.  Patient's workup is overall reassuring normal CBC, chemistry largely unchanged from baseline slight lipase elevation, LFTs are reassuringly normal.  CT scan shows moderate constipation but no other concerning findings.  Given the patient's reassuring workup and the fact that she is feeling much better after medications and fluid we will discharge patient home to have her follow-up with her dialysis center in the morning as scheduled.  Patient agreeable to plan.  FINAL CLINICAL IMPRESSION(S) / ED DIAGNOSES   Nausea vomiting Flank  pain   Note:  This document was prepared using Dragon voice recognition software and may include unintentional dictation errors.   Minna Antis, MD 02/16/23 2239

## 2023-02-16 NOTE — ED Triage Notes (Signed)
First RN Note: Pt to ED via [facility] from home with c/o weakness and emesis since 1400 after eating [employer]. Per EMS pt able to ambulate to stretcher, no emesis with EMS.    95HR 99% RA 200/89 (hx of HTN, has not taken medication today)

## 2023-03-05 NOTE — Telephone Encounter (Signed)
error 

## 2023-03-11 ENCOUNTER — Other Ambulatory Visit: Payer: Self-pay | Admitting: Family Medicine

## 2023-03-11 DIAGNOSIS — Z1231 Encounter for screening mammogram for malignant neoplasm of breast: Secondary | ICD-10-CM

## 2023-03-25 ENCOUNTER — Ambulatory Visit: Payer: 59 | Admitting: Student in an Organized Health Care Education/Training Program

## 2023-04-10 ENCOUNTER — Ambulatory Visit
Admission: RE | Admit: 2023-04-10 | Discharge: 2023-04-10 | Disposition: A | Discharge status: Home / Self Care | Payer: 59 | Source: Ambulatory Visit | Attending: Family Medicine | Admitting: Family Medicine

## 2023-04-10 DIAGNOSIS — Z1231 Encounter for screening mammogram for malignant neoplasm of breast: Secondary | ICD-10-CM | POA: Diagnosis present

## 2023-07-03 ENCOUNTER — Encounter: Payer: Self-pay | Admitting: Student in an Organized Health Care Education/Training Program

## 2023-07-03 ENCOUNTER — Ambulatory Visit
Payer: 59 | Attending: Student in an Organized Health Care Education/Training Program | Admitting: Student in an Organized Health Care Education/Training Program

## 2023-07-03 VITALS — BP 141/72 | HR 73 | Temp 97.4°F | Resp 20 | Ht 68.0 in | Wt 300.0 lb

## 2023-07-03 DIAGNOSIS — M79641 Pain in right hand: Secondary | ICD-10-CM

## 2023-07-03 DIAGNOSIS — M79642 Pain in left hand: Secondary | ICD-10-CM

## 2023-07-03 DIAGNOSIS — Z794 Long term (current) use of insulin: Secondary | ICD-10-CM

## 2023-07-03 DIAGNOSIS — G894 Chronic pain syndrome: Secondary | ICD-10-CM | POA: Diagnosis not present

## 2023-07-03 DIAGNOSIS — E1140 Type 2 diabetes mellitus with diabetic neuropathy, unspecified: Secondary | ICD-10-CM

## 2023-07-03 NOTE — Progress Notes (Signed)
Safety precautions to be maintained throughout the outpatient stay will include: orient to surroundings, keep bed in low position, maintain call bell within reach at all times, provide assistance with transfer out of bed and ambulation.  

## 2023-07-03 NOTE — Progress Notes (Signed)
PROVIDER NOTE: Information contained herein reflects review and annotations entered in association with encounter. Interpretation of such information and data should be left to medically-trained personnel. Information provided to patient can be located elsewhere in the medical record under "Patient Instructions". Document created using STT-dictation technology, any transcriptional errors that may result from process are unintentional.    Patient: Rachel Holmes  Service Category: E/M  Provider: Edward Jolly, MD  DOB: 09-10-1961  DOS: 07/03/2023  Referring Provider: Rayetta Humphrey, MD  MRN: [id number]  Specialty: Interventional Pain Management  PCP: Rayetta Humphrey, MD  Type: Established Patient  Setting: Ambulatory outpatient    Location: Office  Delivery: Face-to-face     HPI  [name], a 62 y.o. year old female, is here today because of her Painful diabetic neuropathy (HCC) [E11.40]. Ms. Bebee's primary complain today is Hand Pain  Pertinent problems: Ms. Psencik has Charcot's joint of left foot; Chronic pain of right knee; Diabetes (HCC); Primary osteoarthritis of right knee; Morbid obesity with BMI of 45.0-49.9, adult (HCC); Closed nondisplaced fracture of right patella; Hx of BKA, left (HCC); Diabetic polyneuropathy associated with type 2 diabetes mellitus (HCC); Chronic left shoulder pain; and Below-knee amputation of left lower extremity (HCC) on their pertinent problem list. Pain Assessment: Severity of Chronic pain is reported as a 4 /10. Location: Hand Right, Left/ . Onset: More than a month ago. Quality: Aching, Burning. Timing: Intermittent. Modifying factor(s): Tylenol. Vitals:  height is 5\' 8"  (1.727 m) and weight is 300 lb (136.1 kg). Her temperature is 97.4 F (36.3 C) (abnormal). Her blood pressure is 141/72 (abnormal) and her pulse is 73. Her respiration is 20 and oxygen saturation is 96%.  BMI: Estimated body mass index is 45.61 kg/m as calculated from the  following:   Height as of this encounter: 5\' 8"  (1.727 m).   Weight as of this encounter: 300 lb (136.1 kg).   Reason for encounter: post-procedure evaluation and assessment.    Post-procedure evaluation    Procedure:           Qutenza neurolysis for painful diabetic neuropathy of the right foot    1. Painful diabetic neuropathy (HCC)   2. Chronic pain syndrome    NAS-11 Pain score:   Pre-procedure: 2 /10   Post-procedure: 2 /10      Effectiveness:  Initial hour after procedure: 100 %  Subsequent 4-6 hours post-procedure: 100 %  Analgesia past initial 6 hours: 100 %  Ongoing improvement:  Analgesic:  100%     ROS  Constitutional: Denies any fever or chills Gastrointestinal: No reported hemesis, hematochezia, vomiting, or acute GI distress Musculoskeletal: Denies any acute onset joint swelling, redness, loss of ROM, or weakness Neurological:  Bilateral hand paresthesias  Medication Review  DULoxetine, FreeStyle Libre 14 Day Reader, FreeStyle Libre 14 Day Sensor, Insulin Pen Needle, Magnesium, ONE TOUCH ULTRA 2, Semaglutide(0.25 or 0.5MG /DOS), acetaminophen, albuterol, aspirin EC, calcitRIOL, cetirizine, cloNIDine, clobetasol cream, cyanocobalamin, docusate sodium, doxazosin, ezetimibe, gabapentin, glucosamine-chondroitin, glucose blood, insulin lispro, isosorbide mononitrate, lactulose, methocarbamol, multivitamin with minerals, olmesartan, omeprazole, ondansetron, onetouch ultrasoft, polyethylene glycol powder, torsemide, and traMADol  History Review  Allergy: Ms. Filipiak is allergic to nifedipine, penicillins, statins, hydralazine, hydrocodone, oxycodone, codeine, and ibuprofen. Drug: Ms. Gianni  reports no history of drug use. Alcohol:  reports no history of alcohol use. Tobacco:  reports that she has never smoked. She has never used smokeless tobacco. Social: Ms. Alavez  reports that she has never smoked. She has never  used smokeless tobacco. She reports that she  does not drink alcohol and does not use drugs. Medical:  has a past medical history of Allergic rhinitis, Allergy, Anemia, Anxiety, Aortic stenosis (09/13/2016), Aortic valve endocarditis (12/25/2018), Arthritis, Back pain, Bradycardia, Breast mass, Charcot's joint of ankle, left, CHF (congestive heart failure) (HCC) (07/09/2007), CKD (chronic kidney disease), stage III (HCC), Complication of anesthesia, Constipation, Diabetic nephropathy (HCC), DVT (deep venous thrombosis) (HCC), Dyspnea, Family history of adverse reaction to anesthesia, GERD (gastroesophageal reflux disease), Heart murmur, HLD (hyperlipidemia), Hyperparathyroidism (HCC), Hypertension, Joint pain, Leg edema, Legally blind in left eye, as defined in [place], Lymphedema, Mild pulmonary hypertension (HCC) (09/13/2016), Obesity, Onychomycosis, PONV (postoperative nausea and vomiting), S/P BKA (below knee amputation) unilateral, left (HCC), Sepsis (HCC) (12/2018), and T2DM (type 2 diabetes mellitus) (HCC). Surgical: Ms. Ruhland  has a past surgical history that includes Abdominal hysterectomy; TEE without cardioversion (N/A, 12/28/2018); IR Fluoro Guide CV Line Right (12/29/2018); IR US Guide Vasc Access Right (12/29/2018); IR Removal Tun Cv Cath W/O FL (02/19/2019); Breast biopsy (Left, 2014); Eye surgery (Left, 2007); Amputation (Left, 05/05/2019); AV fistula placement (Right, 09/19/2021); AV fistula placement (Left, 11/09/2021); DIALYSIS/PERMA CATHETER INSERTION (N/A, 03/28/2022); A/V Fistulagram (Left, 06/18/2022); A/V Fistulagram (Left, 08/13/2022); Revison of arteriovenous fistula (Left, 10/16/2022); and DIALYSIS/PERMA CATHETER REMOVAL (N/A, 12/09/2022). Family: family history includes Breast cancer (age of onset: 5) in her sister; Diabetes in her mother and sister; Eating disorder in her mother; Hyperlipidemia in her mother; Hypertension in her mother; Obesity in her mother.  Laboratory Chemistry Profile   Renal Lab Results  Component Value Date    BUN 38 (H) 02/16/2023   CREATININE 4.19 (H) 02/16/2023   BCR 17 06/25/2018   GFRAA 17 (L) 05/28/2019   GFRNONAA 11 (L) 02/16/2023    Hepatic Lab Results  Component Value Date   AST 23 02/16/2023   ALT 15 02/16/2023   ALBUMIN 3.6 02/16/2023   ALKPHOS 92 02/16/2023   LIPASE 52 (H) 02/16/2023    Electrolytes Lab Results  Component Value Date   NA 134 (L) 02/16/2023   K 4.0 02/16/2023   CL 98 02/16/2023   CALCIUM 9.0 02/16/2023   MG 2.4 05/12/2019   PHOS 4.5 09/09/2014    Bone Lab Results  Component Value Date   VD25OH 28.5 (L) 06/25/2018    Inflammation (CRP: Acute Phase) (ESR: Chronic Phase) Lab Results  Component Value Date   CRP 29.7 (H) 12/26/2018   ESRSEDRATE 102 (H) 12/26/2018   LATICACIDVEN 2.1 (HH) 12/23/2018         Note: Above Lab results reviewed.  Recent Imaging Review  MM 3D SCREENING MAMMOGRAM BILATERAL BREAST CLINICAL DATA:  Screening.  EXAM: DIGITAL SCREENING BILATERAL MAMMOGRAM WITH TOMOSYNTHESIS AND CAD  TECHNIQUE: Bilateral screening digital craniocaudal and mediolateral oblique mammograms were obtained. Bilateral screening digital breast tomosynthesis was performed. The images were evaluated with computer-aided detection.  COMPARISON:  Previous exam(s).  ACR Breast Density Category b: There are scattered areas of fibroglandular density.  FINDINGS: There are no findings suspicious for malignancy.  IMPRESSION: No mammographic evidence of malignancy. A result letter of this screening mammogram will be mailed directly to the patient.  RECOMMENDATION: Screening mammogram in one year. (Code:SM-B-01Y)  BI-RADS CATEGORY  1: Negative.  Electronically Signed   By: Jacob Moores M.D.   On: 04/11/2023 15:51 Note: Reviewed        Physical Exam  General appearance: Well nourished, well developed, and well hydrated. In no apparent acute distress Mental status: Alert, oriented x  3 (person, place, & time)       Respiratory: No  evidence of acute respiratory distress Eyes: PERLA Vitals: BP (!) 141/72   Pulse 73   Temp (!) 97.4 F (36.3 C)   Resp 20   Ht 5\' 8"  (1.727 m)   Wt 300 lb (136.1 kg)   SpO2 96%   BMI 45.61 kg/m  BMI: Estimated body mass index is 45.61 kg/m as calculated from the following:   Height as of this encounter: 5\' 8"  (1.727 m).   Weight as of this encounter: 300 lb (136.1 kg). Ideal: Ideal body weight: 63.9 kg (140 lb 14 oz) Adjusted ideal body weight: 92.8 kg (204 lb 8.4 oz)  Bilateral hand paresthesias  Improvement in lower extremity neuropathic pain  Assessment   Diagnosis Status  1. Painful diabetic neuropathy (HCC)   2. Bilateral hand pain   3. Chronic pain syndrome    Responding Having a Flare-up Controlled   Updated Problems: No problems updated.  Plan of Care  Patient is status post Qutenza for lower extremity painful diabetic neuropathic pain.  She endorses approximately 100% pain relief in regards to her bilateral foot and ankle pain.  She is experiencing similar symptoms in bilateral hands and describes burning and tingling along her thenar eminence and in multiple fingers.  She is interested in trying Qutenza for bilateral hands.  Risk and benefits reviewed and patient would like to proceed.  Orders:  Orders Placed This Encounter  Procedures   NEUROLYSIS    Please order Qutenza patches from pharmacy    Standing Status:   Future    Standing Expiration Date:   07/16/2023    Order Specific Question:   Where will this procedure be performed?    Answer:   [facility]   Follow-up plan:   Return in about 2 weeks (around 07/17/2023) for Qutenza (bilateral hands).      s/p R knee hyalgan 08/30/2019 and 09/13/2019, R knee steroid injection on 09/29/2019- not helpful,  R knee GNB- 11/08/19- 75% pain relief for 3-4 weeks,R knee RFA 02/07/20 helped significantly, repeat as needed, plan for left shoulder injection.  Right genicular nerve RFA 03/04/22          Recent  Visits No visits were found meeting these conditions. Showing recent visits within past 90 days and meeting all other requirements Today's Visits Date Type Provider Dept  07/03/23 Office Visit Edward Jolly, MD [facility]  Showing today's visits and meeting all other requirements Future Appointments No visits were found meeting these conditions. Showing future appointments within next 90 days and meeting all other requirements  I discussed the assessment and treatment plan with the patient. The patient was provided an opportunity to ask questions and all were answered. The patient agreed with the plan and demonstrated an understanding of the instructions.  Patient advised to call back or seek an in-person evaluation if the symptoms or condition worsens.  Duration of encounter: .  Total time on encounter, as per AMA guidelines included both the face-to-face and non-face-to-face time personally spent by the physician and/or other qualified health care professional(s) on the day of the encounter (includes time in activities that require the physician or other qualified health care professional and does not include time in activities normally performed by clinical staff). Physician's time may include the following activities when performed: Preparing to see the patient (e.g., pre-charting review of records, searching for previously ordered imaging, lab work, and nerve conduction tests) Review  of prior analgesic pharmacotherapies. Reviewing PMP Interpreting ordered tests (e.g., lab work, imaging, nerve conduction tests) Performing post-procedure evaluations, including interpretation of diagnostic procedures Obtaining and/or reviewing separately obtained history Performing a medically appropriate examination and/or evaluation Counseling and educating the patient/family/caregiver Ordering medications, tests, or procedures Referring and communicating with other health care  professionals (when not separately reported) Documenting clinical information in the electronic or other health record Independently interpreting results (not separately reported) and communicating results to the patient/ family/caregiver Care coordination (not separately reported)  Note by: Edward Jolly, MD Date: 07/03/2023; Time: 12:27 PM

## 2023-07-03 NOTE — Patient Instructions (Signed)
Capsaicin Patches What is this medication? CAPSAICIN (cap SAY sin) relieves minor pain in your muscles and joints. It works by making your skin feel warm or cool, which blocks pain signals going to the brain. This medicine may be used for other purposes; ask your health care provider or pharmacist if you have questions. COMMON BRAND NAME(S): Qutenza What should I tell my care team before I take this medication? They need to know if you have any of these conditions: Broken or irritated skin High blood pressure History of heart attack or stroke An unusual or allergic reaction to capsaicin, hot peppers, other medications, foods, dyes, or preservatives Pregnant or trying to get pregnant Breast-feeding How should I use this medication? This medication is for external use only. It is applied by your care team in a hospital or clinic setting. Talk to your care team about the use of this medication in children. Special care may be needed. Overdosage: If you think you have taken too much of this medicine contact a poison control center or emergency room at once. NOTE: This medicine is only for you. Do not share this medicine with others. What if I miss a dose? This does not apply. What may interact with this medication? Interactions are not expected. Do not use any other skin products on the affected area without asking your care team. This list may not describe all possible interactions. Give your health care provider a list of all the medicines, herbs, non-prescription drugs, or dietary supplements you use. Also tell them if you smoke, drink alcohol, or use illegal drugs. Some items may interact with your medicine. What should I watch for while using this medication? Your condition will be monitored carefully while you are receiving this medication. Your blood pressure may go up during the procedure. Do not touch the medication patch during treatment. This medication causes red, burning skin. You  may need pain medication for during and after the procedure. This medication can make you more sensitive to heat for a few days after treatment. Be careful in hot showers or baths. Keep out of the sun. Exercise may make the treated skin feel hotter. Tell your care team if your symptoms do not start to get better or if they get worse. What side effects may I notice from receiving this medication? Side effects that you should report to your care team as soon as possible: Allergic reactions--skin rash, itching, hives, swelling of the face, lips, tongue, or throat Side effects that usually do not require medical attention (report these to your care team if they continue or are bothersome): Mild skin irritation, redness, or dryness This list may not describe all possible side effects. Call your doctor for medical advice about side effects. You may report side effects to FDA at 1-800-FDA-1088. Where should I keep my medication? This medication is given in a hospital or clinic. It will not be stored at home. NOTE: This sheet is a summary. It may not cover all possible information. If you have questions about this medicine, talk to your doctor, pharmacist, or health care provider.  2024 Elsevier/Gold Standard (2021-08-21 00:00:00)  

## 2023-07-21 ENCOUNTER — Ambulatory Visit
Payer: 59 | Attending: Student in an Organized Health Care Education/Training Program | Admitting: Student in an Organized Health Care Education/Training Program

## 2023-08-06 ENCOUNTER — Ambulatory Visit
Payer: 59 | Attending: Student in an Organized Health Care Education/Training Program | Admitting: Student in an Organized Health Care Education/Training Program

## 2023-08-06 ENCOUNTER — Encounter: Payer: Self-pay | Admitting: Student in an Organized Health Care Education/Training Program

## 2023-08-06 VITALS — BP 181/74 | HR 78 | Temp 97.4°F | Resp 17 | Ht 68.0 in | Wt 300.0 lb

## 2023-08-06 DIAGNOSIS — M79642 Pain in left hand: Secondary | ICD-10-CM | POA: Diagnosis present

## 2023-08-06 DIAGNOSIS — G894 Chronic pain syndrome: Secondary | ICD-10-CM | POA: Insufficient documentation

## 2023-08-06 DIAGNOSIS — M79641 Pain in right hand: Secondary | ICD-10-CM | POA: Diagnosis present

## 2023-08-06 DIAGNOSIS — E1140 Type 2 diabetes mellitus with diabetic neuropathy, unspecified: Secondary | ICD-10-CM | POA: Diagnosis present

## 2023-08-06 MED ORDER — CAPSAICIN-CLEANSING GEL 8 % EX KIT
4.0000 | PACK | Freq: Once | CUTANEOUS | Status: AC
  Administered 2023-08-06: 4 via TOPICAL
  Filled 2023-08-06: qty 4

## 2023-08-06 NOTE — Progress Notes (Signed)
PROVIDER NOTE: Interpretation of information contained herein should be left to medically-trained personnel. Specific patient instructions are provided elsewhere under "Patient Instructions" section of medical record. This document was created in part using STT-dictation technology, any transcriptional errors that may result from this process are unintentional.  Patient: Rachel Holmes Type: Established DOB: [birth year] MRN: [id number] PCP: Rayetta Humphrey, MD  Service: Procedure DOS: 08/06/2023 Setting: Ambulatory Location: Ambulatory outpatient facility Delivery: Face-to-face Provider: Edward Jolly, MD Specialty: Interventional Pain Management Specialty designation: 09 Location: Outpatient facility Ref. Prov.: Rayetta Humphrey, MD       Interventional Therapy   Interventional Treatment:           Type: Qutenza Neurolysis #1  Laterality:  Bilateral Area treated: HANDS Imaging Guidance: None Anesthesia/analgesia/anxiolysis/sedation: None required Medication (Right): Qutenza (capsaicin 8%) topical system Medication (Left): Qutenza (capsaicin 8%) topical system Date: 08/06/2023 Performed by: Edward Jolly, MD Rationale (medical necessity): procedure needed and proper for the treatment of [name]'s medical symptoms and needs. Indication: Painful diabetic peripheral neuralgia (DPN) (ICD-10-CM:E11.40) severe enough to impact quality of life or function. 1. Painful diabetic neuropathy (HCC)   2. Bilateral hand pain   3. Chronic pain syndrome    NAS-11 Pain score:   Pre-procedure: 4 /10   Post-procedure: 4 /10     Position / Prep / Materials:  Position: Supine  Materials: Qutenza Kit  H&P (Pre-op Assessment):  Rachel Holmes is a 62 y.o. (year old), female patient, seen today for interventional treatment. She  has a past surgical history that includes Abdominal hysterectomy; TEE without cardioversion (N/A, 12/28/2018); IR Fluoro Guide CV Line Right (12/29/2018); IR US Guide Vasc  Access Right (12/29/2018); IR Removal Tun Cv Cath W/O FL (02/19/2019); Breast biopsy (Left, 2014); Eye surgery (Left, 2007); Amputation (Left, 05/05/2019); AV fistula placement (Right, 09/19/2021); AV fistula placement (Left, 11/09/2021); DIALYSIS/PERMA CATHETER INSERTION (N/A, 03/28/2022); A/V Fistulagram (Left, 06/18/2022); A/V Fistulagram (Left, 08/13/2022); Revison of arteriovenous fistula (Left, 10/16/2022); and DIALYSIS/PERMA CATHETER REMOVAL (N/A, 12/09/2022). [name] has a current medication list which includes the following prescription(s): aspirin ec, one touch ultra 2, calcitriol, cetirizine, clonidine, freestyle libre 14 day reader, freestyle libre 14 day sensor, docusate sodium, doxazosin, duloxetine, ezetimibe, gabapentin, gabapentin, glucosamine-chondroitin, insulin lispro, insulin pen needle, isosorbide mononitrate, lactulose, onetouch ultrasoft, magnesium, methocarbamol, multivitamin with minerals, olmesartan, omeprazole, ondansetron, onetouch verio, ozempic (0.25 or 0.5 mg/dose), polyethylene glycol powder, torsemide, cyanocobalamin, acetaminophen, albuterol, clobetasol cream, tramadol, and tramadol, and the following Facility-Administered Medications: capsaicin topical system. Her primarily concern today is the Hand Pain (bilat)  Initial Vital Signs:  Pulse/HCG Rate: 76  Temp: (!) 97.4 F (36.3 C) Resp: 17 BP: (!) 161/67 SpO2: 99 %  BMI: Estimated body mass index is 45.61 kg/m as calculated from the following:   Height as of this encounter: 5\' 8"  (1.727 m).   Weight as of this encounter: 300 lb (136.1 kg).  Risk Assessment: Allergies: Reviewed. She is allergic to nifedipine, penicillins, statins, hydralazine, hydrocodone, oxycodone, codeine, and ibuprofen.  Allergy Precautions: None required Coagulopathies: Reviewed. None identified.  Blood-thinner therapy: None at this time Active Infection(s): Reviewed. None identified. [name] is afebrile  Site Confirmation: Ms.  Linzer was asked to confirm the procedure and laterality before marking the site Procedure checklist: Completed Consent: Before the procedure and under the influence of no sedative(s), amnesic(s), or anxiolytics, the patient was informed of the treatment options, risks and possible complications. To fulfill our ethical and legal obligations, as recommended by the American Medical Association's Code of  Ethics, I have informed the patient of my clinical impression; the nature and purpose of the treatment or procedure; the risks, benefits, and possible complications of the intervention; the alternatives, including doing nothing; the risk(s) and benefit(s) of the alternative treatment(s) or procedure(s); and the risk(s) and benefit(s) of doing nothing. The patient was provided information about the general risks and possible complications associated with the procedure. These may include, but are not limited to: failure to achieve desired goals, infection, bleeding, organ or nerve damage, allergic reactions, paralysis, and death. In addition, the patient was informed of those risks and complications associated to the procedure, such as failure to decrease pain; infection; bleeding; organ or nerve damage with subsequent damage to sensory, motor, and/or autonomic systems, resulting in permanent pain, numbness, and/or weakness of one or several areas of the body; allergic reactions; (i.e.: anaphylactic reaction); and/or death. Furthermore, the patient was informed of those risks and complications associated with the medications. These include, but are not limited to: allergic reactions (i.e.: anaphylactic or anaphylactoid reaction(s)); adrenal axis suppression; blood sugar elevation that in diabetics may result in ketoacidosis or comma; water retention that in patients with history of congestive heart failure may result in shortness of breath, pulmonary edema, and decompensation with resultant heart failure; weight  gain; swelling or edema; medication-induced neural toxicity; particulate matter embolism and blood vessel occlusion with resultant organ, and/or nervous system infarction; and/or aseptic necrosis of one or more joints. Finally, the patient was informed that Medicine is not an exact science; therefore, there is also the possibility of unforeseen or unpredictable risks and/or possible complications that may result in a catastrophic outcome. The patient indicated having understood very clearly. We have given the patient no guarantees and we have made no promises. Enough time was given to the patient to ask questions, all of which were answered to the patient's satisfaction. Ms. Yeiter has indicated that she wanted to continue with the procedure. Attestation: I, the ordering provider, attest that I have discussed with the patient the benefits, risks, side-effects, alternatives, likelihood of achieving goals, and potential problems during recovery for the procedure that I have provided informed consent. Date  Time: 08/06/2023 11:03 AM  Pre-Procedure Preparation:  Monitoring: As per clinic protocol. Respiration, ETCO2, SpO2, BP, heart rate and rhythm monitor placed and checked for adequate function Safety Precautions: Patient was assessed for positional comfort and pressure points before starting the procedure. Time-out: I initiated and conducted the "Time-out" before starting the procedure, as per protocol. The patient was asked to participate by confirming the accuracy of the "Time Out" information. Verification of the correct person, site, and procedure were performed and confirmed by me, the nursing staff, and the patient. "Time-out" conducted as per Joint Commission's Universal Protocol (UP.01.01.01). Time: 1117 Start Time:   hrs.  Description/Narrative of Procedure:          Region: Distal UE Target Area: Sensory peripheral nerves affected by diabetic peripheral neuropathy Site: hands Approach:  Percutaneous  No./Series: Not applicable  Purpose: Therapeutic    Start Time:   hrs.  Description of the Procedure: Protocol guidelines were followed. The patient was assisted into a comfortable position.  Informed consent was obtained in the patient monitored in the usual manner.  All questions were answered prior to the procedure.  They Qutenza patches were applied to the affected area and then covered with the wrap.  The Patient was kept under observation until the treatment was completed.  The patches were removed and the treated area  was inspected.  Vitals:   08/06/23 1109  BP: (!) 161/67  Pulse: 76  Resp: 17  Temp: (!) 97.4 F (36.3 C)  SpO2: 99%  Weight: 300 lb (136.1 kg)  Height: 5\' 8"  (1.727 m)     End Time:   hrs.    Post-operative Assessment:  Post-procedure Vital Signs:  Pulse/HCG Rate: 76  Temp: (!) 97.4 F (36.3 C) Resp: 17 BP: (!) 161/67 SpO2: 99 %  EBL: None  Complications: No immediate post-treatment complications observed by team, or reported by patient.  Note: The patient tolerated the entire procedure well. A repeat set of vitals were taken after the procedure and the patient was kept under observation following institutional policy, for this type of procedure. Post-procedural neurological assessment was performed, showing return to baseline, prior to discharge. The patient was provided with post-procedure discharge instructions, including a section on how to identify potential problems. Should any problems arise concerning this procedure, the patient was given instructions to immediately contact us, at any time, without hesitation. In any case, we plan to contact the patient by telephone for a follow-up status report regarding this interventional procedure.  Comments:  No additional relevant information.  Plan of Care (POC)  Orders:  No orders of the defined types were placed in this encounter.    Medications ordered for procedure: Meds ordered  this encounter  Medications   capsaicin topical system 8 % patch 4 patch   Medications administered: [name] had no medications administered during this visit.  See the medical record for exact dosing, route, and time of administration.  Follow-up plan:   Return in about 7 weeks (around 09/24/2023) for PPE, VV.       s/p R knee hyalgan 08/30/2019 and 09/13/2019, R knee steroid injection on 09/29/2019- not helpful,  R knee GNB- 11/08/19- 75% pain relief for 3-4 weeks,R knee RFA 02/07/20 helped significantly, repeat as needed, plan for left shoulder injection.  Right genicular nerve RFA 03/04/22           Recent Visits Date Type Provider Dept  07/03/23 Office Visit Edward Jolly, MD [facility]  Showing recent visits within past 90 days and meeting all other requirements Today's Visits Date Type Provider Dept  08/06/23 Procedure visit Edward Jolly, MD [facility]  Showing today's visits and meeting all other requirements Future Appointments No visits were found meeting these conditions. Showing future appointments within next 90 days and meeting all other requirements  Disposition: Discharge home  Discharge (Date  Time): 08/06/2023;   hrs.   Primary Care Physician: Rayetta Humphrey, MD Location: [facility] Note by: Edward Jolly, MD (TTS technology used. I apologize for any typographical errors that were not detected and corrected.) Date: 08/06/2023; Time: 11:57 AM  Disclaimer:  Medicine is not an Visual merchandiser. The only guarantee in medicine is that nothing is guaranteed. It is important to note that the decision to proceed with this intervention was based on the information collected from the patient. The Data and conclusions were drawn from the patient's questionnaire, the interview, and the physical examination. Because the information was provided in large part by the patient, it cannot be guaranteed that it has not been  purposely or unconsciously manipulated. Every effort has been made to obtain as much relevant data as possible for this evaluation. It is important to note that the conclusions that lead to this procedure are derived in large part from the available data. Always take  into account that the treatment will also be dependent on availability of resources and existing treatment guidelines, considered by other Pain Management Practitioners as being common knowledge and practice, at the time of the intervention. For Medico-Legal purposes, it is also important to point out that variation in procedural techniques and pharmacological choices are the acceptable norm. The indications, contraindications, technique, and results of the above procedure should only be interpreted and judged by a Board-Certified Interventional Pain Specialist with extensive familiarity and expertise in the same exact procedure and technique.

## 2023-08-06 NOTE — Progress Notes (Signed)
Safety precautions to be maintained throughout the outpatient stay will include: orient to surroundings, keep bed in low position, maintain call bell within reach at all times, provide assistance with transfer out of bed and ambulation.  

## 2023-08-07 ENCOUNTER — Telehealth: Payer: Self-pay | Admitting: Student in an Organized Health Care Education/Training Program

## 2023-08-07 ENCOUNTER — Telehealth: Payer: Self-pay | Admitting: *Deleted

## 2023-08-07 NOTE — Telephone Encounter (Signed)
Attempted to call for post procedure follow-up. No answer, mailbox full.

## 2023-08-07 NOTE — Telephone Encounter (Signed)
FYI. PT called back stated that she is doing okay, hand is burning.

## 2023-08-17 NOTE — Progress Notes (Unsigned)
Evaluation Performed:  Follow-up visit  Date:  08/18/2023   ID:  Rachel Holmes, DOB August 12, 1961, MRN [id number]  Patient Location:  [address] [place] [address]   Provider location:   Rachel Holmes, [place] office  PCP:  Rachel Holmes, Rachel Holmes  Cardiologist:  Rachel Holmes [facility]  Chief Complaint  Patient presents with   12 month follow up     Patient was last seen in 06/19/2021. Medications reviewed by the patient verbally.     History of Present Illness:    Rachel Holmes is a 62 y.o. female past medical history of Hypertension;  Anemia of chronic disease;  Obesity, unspecified;  CHF (congestive heart failure) (CMS-HCC);  GERD (gastroesophageal reflux disease);  Bradycardia;  Anxiety,  Hyperparathyroidism (CMS-HCC); Increased PTH level;  CKD (chronic kidney disease) stage 3, GFR 30-59 ml/min;   Type 2 diabetes mellitus with stage 3 chronic kidney disease (CMS-HCC);   Lymphedema   Charcot's joint of left foot  profound Charcot foot deformity in association with this large ulcer Skin ulcer of left ankle with necrosis of bone (HCC)  knee amputation on the left group G streptococcal bacteremia with aortic valve endocarditis  Leg swelling on Ca channel blockers Who presents for follow-up of her chronic diastolic CHF  Last seen by myself in clinic August 2022 Notes from primary care August 14, 2023 indicate blood pressure has been running high  A1c 8.7 down to 7.9 Creatinine 4.7 BUN 34 sodium 133 at that time CKD stage V Recommendation at that time to start dialysis She has a left arm brachiocephalic fistula that was created in January 2023.   On HD on Mon/Wed/Fri Dry weight 142 kg  Recent fall  last month in bathroom on right side Did not have leg in place  Torsemide 100 mg twice a day, bathroom 2x a day Hold clonidine on non HD days She has not been taking clonidine when she gets home after HD Sees Rachel Holmes  On  ozempic, on 1 mg past month Recent move to 2 mg  EKG personally reviewed by myself on todays visit EKG Interpretation Date/Time:  Monday August 18 2023 15:00:12 EDT Ventricular Rate:  72 PR Interval:  158 QRS Duration:  90 QT Interval:  400 QTC Calculation: 438 R Axis:   22  Text Interpretation: Sinus rhythm with marked sinus arrhythmia Nonspecific T wave abnormality When compared with ECG of 27-Nov-2022 09:58, No significant change was found Confirmed by Rachel Holmes [phone]) on 08/18/2023 3:03:09 PM    Other past medical history reviewed In hospital 12/2018 Sepsis secondary to group G streptococcal bacteremia with aortic valve endocarditis and left leg soft tissue infection: Both TTE and TEE confirmed aortic valve endocarditis. Acute hypoxic respiratory failure likely secondary to aspiration pneumonia and decompensated diastolic heart failure: Occurred after extubation post TEE on  2/24, required BiPAP briefly. --done with ABX  [facility]  2017 stress test done Prior cardiac catheterization.    Prior CV studies:   The following studies were reviewed today:  Echo 2017 NORMAL LEFT VENTRICULAR SYSTOLIC FUNCTION   WITH MILD LVH NORMAL RIGHT VENTRICULAR SYSTOLIC FUNCTION MILD VALVULAR REGURGITATION (See above) NO PERICARDIAL EFFUSION GRADE 2 DIASTOLIC DYSFUNCTION MILD AORTIC STENOSIS MILDLY DILATED LEFT ATRIUM MILD PULMONARY HTN WITH ESTIMATED RVSP = 45 MMHG   Echo 12/2018 . Small aortic valve mass, cannot exclude endocarditis vegetation. Measures approximately 0.75 x 1 cm. Demonstrates features of independent motion and is primarily seen on  the ventricular aspect of the aortic valve. Best visualized in parasternal long  axis and short axis views. No significant aortic valve stenosis or regurgitation.  2. The left ventricle has normal systolic function, with an ejection fraction of 55-60%. The cavity size was normal. Left ventricular diastolic Doppler parameters are consistent  with impaired relaxation.  3. The right ventricle has normal systolic function. The cavity was normal. There is no increase in right ventricular wall thickness. Right ventricular systolic pressure is mildly elevated with an estimated pressure of 35.5 mmHg.  4. The mitral valve is normal in structure.  5. The tricuspid valve is normal in structure.  6. The pulmonic valve was normal in structure.  7. The aortic valve is normal in structure.  8. Cannot exclude small PFO with left to right shunt by color flow Doppler.  9. The inferior vena cava was dilated in size with <50% respiratory variability.   Past Medical History:  Diagnosis Date   Allergic rhinitis    Allergy    Anemia    Anxiety    Aortic stenosis 09/13/2016   a.) TTE 09/13/2016: mild AS --> MPG 11.2 mmHg. b.) TTE 12/27/2020: EF 55-60%; no AS --> MPG 8 mmHg.   Aortic valve endocarditis 12/25/2018   a.) TTE 12/25/2018: 0.75 x 1 cm AV mass. b.) TEE 12/28/2018: small shaggy mobile density on aortic side of RIGHT coronary cusp consistent with vegetation.   Arthritis    Back pain    Bradycardia    Breast mass    Patient can no longer palpate specific masses but showed tech general area of concern   Charcot's joint of ankle, left    CHF (congestive heart failure) (HCC) 07/09/2007   a.) TTE 07/09/2007: EF 50%; G1DD. b.) TTE 09/13/2016: EF 55%; G2DD. c.) TTE 12/27/2020: EF 55-60%; G2DD; GLS -15.1%.   CKD (chronic kidney disease), stage III (HCC)    Complication of anesthesia    a.) PONV. b.) Delayed emergence   Constipation    Diabetic nephropathy (HCC)    DVT (deep venous thrombosis) (HCC)    Dyspnea    Family history of adverse reaction to anesthesia    a.) Sisters x 2 with (+) delayed emergence.   GERD (gastroesophageal reflux disease)    Heart murmur    HLD (hyperlipidemia)    Hyperparathyroidism (HCC)    Hypertension    Joint pain    Leg edema    Legally blind in left eye, as defined in [place]    Lymphedema    Mild  pulmonary hypertension (HCC) 09/13/2016   a.) TTE 09/13/2016: EF 55%; RVSP 45 mmHg. b.) TTE 12/25/2018: EF 55-60%; RVSP 35.5 mmHg.   Obesity    Onychomycosis    PONV (postoperative nausea and vomiting)    S/P BKA (below knee amputation) unilateral, left (HCC)    Sepsis (HCC) 12/2018   a.) group G streptococcal bacteremia with AV endocarditis and LLE soft tissue infection.   T2DM (type 2 diabetes mellitus) [facility])    Past Surgical History:  Procedure Laterality Date   A/V FISTULAGRAM Left 06/18/2022   Procedure: A/V Fistulagram;  Surgeon: Renford Dills, Rachel Holmes;  Location: [facility] INVASIVE CV LAB;  Service: Cardiovascular;  Laterality: Left;   A/V FISTULAGRAM Left 08/13/2022   Procedure: A/V Fistulagram;  Surgeon: Renford Dills, Rachel Holmes;  Location: [facility] INVASIVE CV LAB;  Service: Cardiovascular;  Laterality: Left;   ABDOMINAL HYSTERECTOMY     AMPUTATION Left 05/05/2019   Procedure: AMPUTATION BELOW KNEE;  Surgeon:  Schnier, Latina Craver, Rachel Holmes;  Location: [facility] ORS;  Service: Vascular;  Laterality: Left;   AV FISTULA PLACEMENT Right 09/19/2021   Procedure: ARTERIOVENOUS (AV) FISTULA CREATION (BRACHIAL CEPHALIC);  Surgeon: Renford Dills, Rachel Holmes;  Location: [facility] ORS;  Service: Vascular;  Laterality: Right;   AV FISTULA PLACEMENT Left 11/09/2021   Procedure: ARTERIOVENOUS (AV) FISTULA CREATION ( BRACHIAL CEPHALIC );  Surgeon: Renford Dills, Rachel Holmes;  Location: [facility] ORS;  Service: Vascular;  Laterality: Left;   BREAST BIOPSY Left 2014   FNA 12:00 position - Negative   DIALYSIS/PERMA CATHETER INSERTION N/A 03/28/2022   Procedure: DIALYSIS/PERMA CATHETER INSERTION;  Surgeon: Annice Needy, Rachel Holmes;  Location: [facility] INVASIVE CV LAB;  Service: Cardiovascular;  Laterality: N/A;   DIALYSIS/PERMA CATHETER REMOVAL N/A 12/09/2022   Procedure: DIALYSIS/PERMA CATHETER REMOVAL;  Surgeon: Annice Needy, Rachel Holmes;  Location: [facility] INVASIVE CV LAB;  Service: Cardiovascular;  Laterality: N/A;   EYE SURGERY Left 2007   removed a lens, no  lens implanted   IR FLUORO GUIDE CV LINE RIGHT  12/29/2018   IR REMOVAL TUN CV CATH W/O FL  02/19/2019   IR US GUIDE VASC ACCESS RIGHT  12/29/2018   REVISON OF ARTERIOVENOUS FISTULA Left 10/16/2022   Procedure: REVISON OF ARTERIOVENOUS FISTULA;  Surgeon: Renford Dills, Rachel Holmes;  Location: [facility] ORS;  Service: Vascular;  Laterality: Left;   TEE WITHOUT CARDIOVERSION N/A 12/28/2018   Procedure: TRANSESOPHAGEAL ECHOCARDIOGRAM (TEE);  Surgeon: Quintella Reichert, Rachel Holmes;  Location: [facility] ENDOSCOPY;  Service: Cardiovascular;  Laterality: N/A;     Current Meds  Medication Sig   acetaminophen (TYLENOL) 500 MG tablet Take 1,000 mg by mouth every 6 (six) hours as needed.   albuterol (PROVENTIL HFA;VENTOLIN HFA) 108 (90 Base) MCG/ACT inhaler Inhale 1-2 puffs into the lungs every 6 (six) hours as needed for wheezing or shortness of breath.   aspirin EC 81 MG EC tablet Take 1 tablet (81 mg total) by mouth daily.   calcitRIOL (ROCALTROL) 0.25 MCG capsule Take 0.25 mcg by mouth daily.   cetirizine (ZYRTEC) 10 MG tablet Take 10 mg by mouth at bedtime.   clobetasol cream (TEMOVATE) 0.05 % Apply 1 application  topically 2 (two) times daily as needed (rash).   cloNIDine (CATAPRES) 0.3 MG tablet Take 1 tablet (0.3 mg total) by mouth 2 (two) times daily.   docusate sodium (COLACE) 100 MG capsule Take 1 capsule (100 mg total) by mouth 2 (two) times daily.   doxazosin (CARDURA) 4 MG tablet Take 1 tablet (4 mg total) by mouth at bedtime.   DULoxetine (CYMBALTA) 30 MG capsule Take 30 mg by mouth at bedtime.   ezetimibe (ZETIA) 10 MG tablet Take 1 tablet (10 mg total) by mouth daily. (Patient taking differently: Take 10 mg by mouth at bedtime.)   gabapentin (NEURONTIN) 600 MG tablet Take 600 mg by mouth at bedtime.   glucosamine-chondroitin 500-400 MG tablet Take 1 tablet by mouth in the morning and at bedtime.   insulin lispro (HUMALOG KWIKPEN) 100 UNIT/ML KwikPen 3 times a day (just before each meal) 16-18-19 units, and pen  needles 3 day. (Patient taking differently: Inject 24 Units into the skin 3 (three) times daily with meals.)   isosorbide mononitrate (IMDUR) 120 MG 24 hr tablet Take 1 tablet (120 mg total) by mouth daily. (Patient taking differently: Take 120 mg by mouth in the morning and at bedtime.)   lactulose (CHRONULAC) 10 GM/15ML solution Take 15 mLs by mouth daily as needed.   Magnesium 400 MG  CAPS Take 1 capsule by mouth at bedtime.   methocarbamol (ROBAXIN) 750 MG tablet Take 1 tablet (750 mg total) by mouth every 12 (twelve) hours as needed for muscle spasms.   Multiple Vitamin (MULTIVITAMIN WITH MINERALS) TABS tablet Take 1 tablet by mouth daily.   olmesartan (BENICAR) 40 MG tablet Take 40 mg by mouth at bedtime.   omeprazole (PRILOSEC) 40 MG capsule Take 40 mg by mouth 2 (two) times daily with breakfast and lunch.   ondansetron (ZOFRAN-ODT) 4 MG disintegrating tablet Take 1 tablet (4 mg total) by mouth every 8 (eight) hours as needed for nausea or vomiting.   OZEMPIC, 0.25 OR 0.5 MG/DOSE, 2 MG/3ML SOPN Inject into the skin.   torsemide (DEMADEX) 100 MG tablet Take 100 mg by mouth every morning.   vitamin B-12 (CYANOCOBALAMIN) 1000 MCG tablet Take 1,000 mcg by mouth daily.     Allergies:   Nifedipine, Penicillins, Statins, Hydralazine, Hydrocodone, Oxycodone, Codeine, and Ibuprofen   Social History   Tobacco Use   Smoking status: Never   Smokeless tobacco: Never  Vaping Use   Vaping status: Never Used  Substance Use Topics   Alcohol use: No   Drug use: Never     Family Hx: The patient's family history includes Breast cancer (age of onset: 67) in her sister; Diabetes in her mother and sister; Eating disorder in her mother; Hyperlipidemia in her mother; Hypertension in her mother; Obesity in her mother.  ROS:   Please see the history of present illness.    Review of Systems  Constitutional: Negative.   HENT: Negative.    Respiratory: Negative.    Cardiovascular:  Positive for leg  swelling.  Gastrointestinal: Negative.   Musculoskeletal:  Positive for joint pain.  Neurological: Negative.   Psychiatric/Behavioral: Negative.    All other systems reviewed and are negative.    Labs/Other Tests and Data Reviewed:    Recent Labs: 02/16/2023: ALT 15; BUN 38; Creatinine, Ser 4.19; Hemoglobin 11.4; Platelets 219; Potassium 4.0; Sodium 134   Recent Lipid Panel Lab Results  Component Value Date/Time   CHOL 191 06/25/2018 10:50 AM   CHOL 157 11/20/2013 09:54 AM   TRIG 104 06/25/2018 10:50 AM   TRIG 114 11/20/2013 09:54 AM   HDL 47 06/25/2018 10:50 AM   HDL 38 (L) 11/20/2013 09:54 AM   LDLCALC 123 (H) 06/25/2018 10:50 AM   LDLCALC 96 11/20/2013 09:54 AM    Wt Readings from Last 3 Encounters:  08/18/23 (!) 317 lb 5 oz (143.9 kg)  08/06/23 300 lb (136.1 kg)  07/03/23 300 lb (136.1 kg)     Exam:    Vital Signs: Vital signs may also be detailed in the HPI BP 136/60 (BP Location: Right Arm, Patient Position: Sitting, Cuff Size: Normal)   Pulse 72   Ht 5\' 7"  (1.702 m)   Wt (!) 317 lb 5 oz (143.9 kg)   SpO2 95%   BMI 49.70 kg/m  Constitutional:  oriented to person, place, and time. No distress.  HENT:  Head: Grossly normal Eyes:  no discharge. No scleral icterus.  Neck: No JVD, no carotid bruits  Cardiovascular: Regular rate and rhythm, no murmurs appreciated Pulmonary/Chest: Clear to auscultation bilaterally, no wheezes or rails Abdominal: Soft.  no distension.  no tenderness.  Musculoskeletal: Normal range of motion, prosthetic left leg Neurological:  normal muscle tone. Coordination normal. No atrophy Skin: Skin warm and dry Psychiatric: normal affect, pleasant   ASSESSMENT & PLAN:    Chronic diastolic CHF (  congestive heart failure) (HCC) Fluid managed by hemodialysis Also taking torsemide 100 mg twice daily daily per nephrology Echocardiogram ordered to update records  Essential hypertension Unable to tolerate calcium channel blocker secondary to  leg swelling Intolerance to hydralazine Reports blood pressure continues to run high though somewhat reasonable today after HD Reports she has not been taking her clonidine when she gets home after HD, misses her morning dose.  Recommend she take her morning clonidine Monday Wednesday Friday after she gets home with some food.  If blood pressure continues to run high, half dose clonidine could be added at 1 PM daily on nondialysis days Few other options for blood pressure  Leg edema Chronic lymphedema, leg swelling stable  avoid calcium channel blockers  Chest pain of uncertain etiology Denies chest pain concerning for angina  Endocarditis February 2020 Repeat echocardiogram has been ordered to evaluate aortic valve given prior endocarditis    Signed, Rachel Holmes, Rachel Holmes  08/18/2023 3:13 PM    [facility] [address], [place], [place] [address]

## 2023-08-18 ENCOUNTER — Encounter: Payer: Self-pay | Admitting: Cardiovascular Disease

## 2023-08-18 ENCOUNTER — Ambulatory Visit: Payer: 59 | Attending: Cardiovascular Disease | Admitting: Cardiovascular Disease

## 2023-08-18 ENCOUNTER — Other Ambulatory Visit: Payer: Self-pay | Admitting: Family Medicine

## 2023-08-18 VITALS — BP 136/60 | HR 72 | Ht 67.0 in | Wt 317.3 lb

## 2023-08-18 DIAGNOSIS — I5032 Chronic diastolic (congestive) heart failure: Secondary | ICD-10-CM | POA: Diagnosis not present

## 2023-08-18 DIAGNOSIS — I129 Hypertensive chronic kidney disease with stage 1 through stage 4 chronic kidney disease, or unspecified chronic kidney disease: Secondary | ICD-10-CM

## 2023-08-18 DIAGNOSIS — N186 End stage renal disease: Secondary | ICD-10-CM

## 2023-08-18 DIAGNOSIS — R1900 Intra-abdominal and pelvic swelling, mass and lump, unspecified site: Secondary | ICD-10-CM

## 2023-08-18 DIAGNOSIS — I739 Peripheral vascular disease, unspecified: Secondary | ICD-10-CM | POA: Diagnosis not present

## 2023-08-18 DIAGNOSIS — E1151 Type 2 diabetes mellitus with diabetic peripheral angiopathy without gangrene: Secondary | ICD-10-CM

## 2023-08-18 DIAGNOSIS — I10 Essential (primary) hypertension: Secondary | ICD-10-CM

## 2023-08-18 DIAGNOSIS — Z794 Long term (current) use of insulin: Secondary | ICD-10-CM

## 2023-08-18 DIAGNOSIS — I330 Acute and subacute infective endocarditis: Secondary | ICD-10-CM

## 2023-08-18 NOTE — Patient Instructions (Addendum)
Medication Instructions:  No changes  If you need a refill on your cardiac medications before your next appointment, please call your pharmacy.   Lab work: No new labs needed  Testing/Procedures: Your physician has requested that you have an echocardiogram. Echocardiography is a painless test that uses sound waves to create images of your heart. It provides your doctor with information about the size and shape of your heart and how well your heart's chambers and valves are working.   You may receive an ultrasound enhancing agent through an IV if needed to better visualize your heart during the echo. This procedure takes approximately one hour.  There are no restrictions for this procedure.  This will take place at [address] [address], [place] [address]   Follow-Up: At [facility], you and your health needs are our priority.  As part of our continuing mission to provide you with exceptional heart care, we have created designated Provider Care Teams.  These Care Teams include your primary Cardiologist (physician) and Advanced Practice Providers (APPs -  Physician Assistants and Nurse Practitioners) who all work together to provide you with the care you need, when you need it.  You will need a follow up appointment in 6 months  Providers on your designated Care Team:   Nicolasa Ducking, NP Eula Listen, PA-C Cadence Fransico Michael, [place]  COVID-19 Vaccine Information can be found at: [email or web address] For questions related to vaccine distribution or appointments, please email [email or web address] or call [phone].

## 2023-08-21 ENCOUNTER — Ambulatory Visit
Admission: RE | Admit: 2023-08-21 | Discharge: 2023-08-21 | Disposition: A | Discharge status: Home / Self Care | Payer: 59 | Source: Ambulatory Visit | Attending: Family Medicine | Admitting: Family Medicine

## 2023-08-21 DIAGNOSIS — R1900 Intra-abdominal and pelvic swelling, mass and lump, unspecified site: Secondary | ICD-10-CM | POA: Diagnosis present

## 2023-08-21 DIAGNOSIS — Z794 Long term (current) use of insulin: Secondary | ICD-10-CM | POA: Diagnosis present

## 2023-08-21 DIAGNOSIS — E1151 Type 2 diabetes mellitus with diabetic peripheral angiopathy without gangrene: Secondary | ICD-10-CM | POA: Insufficient documentation

## 2023-08-22 ENCOUNTER — Encounter: Payer: Self-pay | Admitting: Family Medicine

## 2023-08-22 ENCOUNTER — Ambulatory Visit
Admission: RE | Admit: 2023-08-22 | Discharge: 2023-08-22 | Disposition: A | Discharge status: Home / Self Care | Payer: 59 | Source: Ambulatory Visit | Attending: Physician Assistant | Admitting: Physician Assistant

## 2023-08-22 ENCOUNTER — Other Ambulatory Visit: Payer: Self-pay | Admitting: Physician Assistant

## 2023-08-22 DIAGNOSIS — R1907 Generalized intra-abdominal and pelvic swelling, mass and lump: Secondary | ICD-10-CM | POA: Insufficient documentation

## 2023-08-22 DIAGNOSIS — R1900 Intra-abdominal and pelvic swelling, mass and lump, unspecified site: Secondary | ICD-10-CM

## 2023-09-01 ENCOUNTER — Other Ambulatory Visit: Payer: Self-pay | Admitting: Family Medicine

## 2023-09-01 DIAGNOSIS — R1900 Intra-abdominal and pelvic swelling, mass and lump, unspecified site: Secondary | ICD-10-CM

## 2023-09-04 ENCOUNTER — Ambulatory Visit: Payer: 59 | Attending: Cardiovascular Disease

## 2023-09-04 DIAGNOSIS — I5032 Chronic diastolic (congestive) heart failure: Secondary | ICD-10-CM

## 2023-09-04 LAB — ECHOCARDIOGRAM COMPLETE
Area-P 1/2: 2.8 cm2
S' Lateral: 4 cm

## 2023-09-09 ENCOUNTER — Encounter: Payer: Self-pay | Admitting: Emergency Medicine

## 2023-09-24 ENCOUNTER — Ambulatory Visit
Payer: 59 | Attending: Student in an Organized Health Care Education/Training Program | Admitting: Student in an Organized Health Care Education/Training Program

## 2023-09-24 DIAGNOSIS — M79641 Pain in right hand: Secondary | ICD-10-CM

## 2023-09-24 DIAGNOSIS — G894 Chronic pain syndrome: Secondary | ICD-10-CM

## 2023-09-24 DIAGNOSIS — E1140 Type 2 diabetes mellitus with diabetic neuropathy, unspecified: Secondary | ICD-10-CM

## 2023-09-24 DIAGNOSIS — M79642 Pain in left hand: Secondary | ICD-10-CM

## 2023-09-24 NOTE — Progress Notes (Signed)
I attempted to call the patient however no response. Voicemail left instructing patient to call front desk office at [phone] to reschedule appointment. -Dr Cherylann Ratel   Post-procedure evaluation   Type: Qutenza Neurolysis #1  Laterality:  Bilateral Area treated: HANDS Imaging Guidance: None Anesthesia/analgesia/anxiolysis/sedation: None required Medication (Right): Qutenza (capsaicin 8%) topical system Medication (Left): Qutenza (capsaicin 8%) topical system Date: 08/06/2023 Performed by: Edward Jolly, MD Rationale (medical necessity): procedure needed and proper for the treatment of Ms. Maiolo's medical symptoms and needs. Indication: Painful diabetic peripheral neuralgia (DPN) (ICD-10-CM:E11.40) severe enough to impact quality of life or function. 1. Painful diabetic neuropathy (HCC)   2. Bilateral hand pain   3. Chronic pain syndrome    NAS-11 Pain score:   Pre-procedure: 4 /10   Post-procedure: 4 /10      Effectiveness:  Initial hour after procedure: 0 %  Subsequent 4-6 hours post-procedure: 0 %  Analgesia past initial 6 hours: 75 % (current)  Ongoing improvement:  Analgesic:  75% Function: Ms. Herschberger reports improvement in function ROM: Ms. Doung reports improvement in ROM

## 2023-11-12 ENCOUNTER — Emergency Department: Payer: 59

## 2023-11-12 ENCOUNTER — Encounter: Payer: Self-pay | Admitting: Emergency Medicine

## 2023-11-12 ENCOUNTER — Emergency Department
Admission: EM | Admit: 2023-11-12 | Discharge: 2023-11-12 | Disposition: A | Discharge status: Home / Self Care | Payer: 59 | Attending: Emergency Medicine | Admitting: Emergency Medicine

## 2023-11-12 ENCOUNTER — Other Ambulatory Visit: Payer: Self-pay

## 2023-11-12 DIAGNOSIS — I509 Heart failure, unspecified: Secondary | ICD-10-CM | POA: Insufficient documentation

## 2023-11-12 DIAGNOSIS — I130 Hypertensive heart and chronic kidney disease with heart failure and stage 1 through stage 4 chronic kidney disease, or unspecified chronic kidney disease: Secondary | ICD-10-CM | POA: Insufficient documentation

## 2023-11-12 DIAGNOSIS — S93401A Sprain of unspecified ligament of right ankle, initial encounter: Secondary | ICD-10-CM | POA: Diagnosis not present

## 2023-11-12 DIAGNOSIS — X501XXA Overexertion from prolonged static or awkward postures, initial encounter: Secondary | ICD-10-CM | POA: Insufficient documentation

## 2023-11-12 DIAGNOSIS — E1140 Type 2 diabetes mellitus with diabetic neuropathy, unspecified: Secondary | ICD-10-CM | POA: Diagnosis not present

## 2023-11-12 DIAGNOSIS — W19XXXA Unspecified fall, initial encounter: Secondary | ICD-10-CM

## 2023-11-12 DIAGNOSIS — S99911A Unspecified injury of right ankle, initial encounter: Secondary | ICD-10-CM | POA: Diagnosis present

## 2023-11-12 DIAGNOSIS — E1122 Type 2 diabetes mellitus with diabetic chronic kidney disease: Secondary | ICD-10-CM | POA: Diagnosis not present

## 2023-11-12 DIAGNOSIS — N183 Chronic kidney disease, stage 3 unspecified: Secondary | ICD-10-CM | POA: Insufficient documentation

## 2023-11-12 MED ORDER — ACETAMINOPHEN 325 MG PO TABS
650.0000 mg | ORAL_TABLET | Freq: Once | ORAL | Status: AC
  Administered 2023-11-12: 650 mg via ORAL
  Filled 2023-11-12: qty 2

## 2023-11-12 NOTE — ED Provider Notes (Signed)
 [facility] Provider Note    Event Date/Time   First MD Initiated Contact with Patient 11/12/23 1324     (approximate)   History   Fall   HPI  Rachel Holmes is a 63 y.o. female with a past medical history of hemodialysis, diabetic neuropathy, chronic pain syndrome, CKD, morbid obesity Charcot foot who presents today for evaluation after a fall.  Patient reports that her prosthetic fell off of her left lower extremity, and she twisted her right ankle.  She denies head strike or LOC.  She reports that she hit her left hand when she fell, but reports it is fine now.  She denies numbness or tingling.  She has been able to ambulate.  No headache or neck pain.  No weakness or paresthesias.  No vomiting or visual changes.  Patient Active Problem List   Diagnosis Date Noted   Painful diabetic neuropathy (HCC) 02/12/2022   Chronic pain syndrome 02/12/2022   Complication of vascular access for dialysis 10/16/2021   Urinary incontinence in female 07/21/2021   Hyposmolality and/or hyponatremia 06/18/2021   Type 2 diabetes mellitus with diabetic peripheral angiopathy without gangrene, with long-term current use of insulin  (HCC) 12/01/2020   Difficulty walking 08/05/2020   Arthropathy of left shoulder 07/25/2020   Dysfunction of left rotator cuff 07/25/2020   Acidosis 09/20/2019   Benign hypertensive kidney disease with chronic kidney disease 09/20/2019   Proteinuria 09/20/2019   Secondary hyperparathyroidism of renal origin (HCC) 09/20/2019   Chronic metabolic acidosis 09/20/2019   Cataract 09/02/2019   Chronic left shoulder pain 07/13/2019   Wound dehiscence 05/29/2019   Hx of BKA, left (HCC) 05/24/2019   PVD (peripheral vascular disease) (HCC) 05/13/2019   Below-knee amputation of left lower extremity (HCC) 05/13/2019   Chronic osteomyelitis of ankle and foot, left (HCC) 05/05/2019   Chronic diastolic CHF (congestive heart failure) (HCC) 04/19/2019   Leg  edema 04/19/2019   Acute bacterial endocarditis 04/19/2019   Chest pain of uncertain etiology 04/19/2019   Ulcer of left ankle (HCC) 04/14/2019   Ankle deformity, left 01/21/2019   Diabetic polyneuropathy associated with type 2 diabetes mellitus (HCC) 01/21/2019   Aortic valve endocarditis    Bacteremia due to Streptococcus 12/24/2018   Diabetic ulcer of left ankle associated with diabetes mellitus due to underlying condition, with fat layer exposed (HCC) 11/09/2018   Malignant hypertension (arteriolar nephrosclerosis), stage 1-4 or unspecified chronic kidney disease 07/15/2018   End stage renal disease (HCC) 07/15/2018   Morbid obesity with BMI of 45.0-49.9, adult (HCC) 07/15/2018   Excessive daytime sleepiness 07/15/2018   Other fatigue 03/11/2018   Essential hypertension 03/11/2018   Vitamin D  deficiency 03/11/2018   Congestive heart failure (HCC) 03/11/2018   Primary osteoarthritis of right knee 01/06/2018   Diabetes (HCC) 10/03/2017   Chronic pain of right knee 10/02/2017   Obesity 10/02/2017   Charcot's joint of left foot 08/29/2017   Lymphedema 09/06/2016   Hyponatremia with extracellular fluid depletion 02/02/2016   Nausea with vomiting, unspecified 02/02/2016   Closed nondisplaced fracture of right patella 08/23/2015   Acute cystitis without hematuria 08/14/2014   Colitis 08/14/2014   HTN (hypertension), malignant 08/14/2014   Acute renal failure superimposed on stage 3 chronic kidney disease (HCC) 06/26/2014   Hyperparathyroidism, unspecified (HCC) 02/24/2014   Allergic rhinitis 02/22/2014   Onychomycosis 02/22/2014   Anxiety 11/05/2013   Bradycardia 05/14/2013   Gastro-esophageal reflux disease without esophagitis 04/05/2013   Anemia in other chronic diseases classified elsewhere  01/27/2013          Physical Exam   Triage Vital Signs: ED Triage Vitals  Encounter Vitals Group     BP 11/12/23 1223 (!) 170/67     Systolic BP Percentile --      Diastolic BP  Percentile --      Pulse Rate 11/12/23 1223 (!) 59     Resp 11/12/23 1223 20     Temp 11/12/23 1223 (!) 97.5 F (36.4 C)     Temp Source 11/12/23 1223 Oral     SpO2 11/12/23 1223 98 %     Weight 11/12/23 1221 300 lb (136.1 kg)     Height 11/12/23 1221 5' 8 (1.727 m)     Head Circumference --      Peak Flow --      Pain Score 11/12/23 1221 10     Pain Loc --      Pain Education --      Exclude from Growth Chart --     Most recent vital signs: Vitals:   11/12/23 1223  BP: (!) 170/67  Pulse: (!) 59  Resp: 20  Temp: (!) 97.5 F (36.4 C)  SpO2: 98%    Physical Exam Vitals and nursing note reviewed.  Constitutional:      General: Awake and alert. No acute distress.    Appearance: Normal appearance. The patient is obese.  HENT:     Head: Normocephalic and atraumatic.     Mouth: Mucous membranes are moist.  Eyes:     General: PERRL. Normal EOMs        Right eye: No discharge.        Left eye: No discharge.     Conjunctiva/sclera: Conjunctivae normal.  Cardiovascular:     Rate and Rhythm: Normal rate.     Pulses: Normal pulses.  Pulmonary:     Effort: Pulmonary effort is normal. No respiratory distress.     Breath sounds: Normal breath sounds.  Abdominal:     Abdomen is soft. There is no abdominal tenderness. No rebound or guarding. No distention. Musculoskeletal:        General: No swelling. Normal range of motion.     Cervical back: Normal range of motion and neck supple.  Left BKA with prosthetic noted Right ankle: Tenderness and swelling over the anterior talofibular ligament and posterior to lateral malleolus, no lateral or medial malleolar tenderness or proximal fifth metacarpal tenderness. No proximal fibular tenderness. 2+ pedal pulses with brisk capillary refill. Intact distal sensation and strength with normal ROM. Able to plantar flex and dorsiflex against resistance. Able to invert and evert against resistance. Negative  dorsiflexion external rotation test.  Negative squeeze test. Negative Thompson test.  Compartment soft and compressible throughout. Normal-appearing hands bilaterally.  No swelling noted.  Normal grip strength bilaterally.  Normal intrinsic muscle function of the hands bilaterally.  No tenderness to wrist, elbows, shoulders bilaterally. Skin:    General: Skin is warm and dry.     Capillary Refill: Capillary refill takes less than 2 seconds.     Findings: No rash.  Neurological:     Mental Status: The patient is awake and alert.      ED Results / Procedures / Treatments   Labs (all labs ordered are listed, but only abnormal results are displayed) Labs Reviewed - No data to display   EKG     RADIOLOGY I independently reviewed and interpreted imaging and agree with radiologists findings.  PROCEDURES:  Critical Care performed:   Procedures   MEDICATIONS ORDERED IN ED: Medications  acetaminophen  (TYLENOL ) tablet 650 mg (650 mg Oral Given 11/12/23 1409)     IMPRESSION / MDM / ASSESSMENT AND PLAN / ED COURSE  I reviewed the triage vital signs and the nursing notes.   Differential diagnosis includes, but is not limited to, fracture, contusion, dislocation, ligamental injury.  Patient is awake and alert, hemodynamically stable and afebrile.  She is nontoxic in appearance.  She has full normal range of motion of all of her joints.  X-rays obtained in triage are negative.  Patient is reassured by these findings.  We discussed symptomatic management, rest, ice, and elevation.  She is able to ambulate with a steady gait and feels comfortable discharge home.  We discussed return precautions and the importance was outpatient follow-up.  Patient understands and agrees with plan.  She was discharged in stable condition.   Patient's presentation is most consistent with acute complicated illness / injury requiring diagnostic workup.      FINAL CLINICAL IMPRESSION(S) / ED DIAGNOSES   Final diagnoses:  Fall,  initial encounter  Sprain of right ankle, unspecified ligament, initial encounter     Rx / DC Orders   ED Discharge Orders     None        Note:  This document was prepared using Dragon voice recognition software and may include unintentional dictation errors.   Nyx Keady E, PA-C 11/12/23 1837    Dorothyann Drivers, MD 11/17/23 2256

## 2023-11-12 NOTE — ED Provider Triage Note (Signed)
 Emergency Medicine Provider Triage Evaluation Note  Rachel Holmes , a 63 y.o. female  was evaluated in triage.  Pt complains of fall due to prosthetic leg dysfunction. She denies striking her head or loss of consciousness. She was able to complete dialysis today. She is having pain in her right ankle and left hand.   Physical Exam  BP (!) 170/67 (BP Location: Right Arm)   Pulse (!) 59   Temp (!) 97.5 F (36.4 C) (Oral)   Resp 20   Ht 5' 8 (1.727 m)   Wt 136.1 kg   SpO2 98%   BMI 45.61 kg/m  Gen:   Awake, no distress   Resp:  Normal effort  MSK:   Moves extremities without difficulty  Other:    Medical Decision Making  Medically screening exam initiated at 12:25 PM.  Appropriate orders placed.  Rachel Holmes was informed that the remainder of the evaluation will be completed by another provider, this initial triage assessment does not replace that evaluation, and the importance of remaining in the ED until their evaluation is complete.     Herlinda Kirk NOVAK, FNP 11/12/23 1429

## 2023-11-12 NOTE — Discharge Instructions (Signed)
 Your x-rays are normal.  You may rest, ice, elevate your ankle.  You may wear the brace to help with extra support.  You may remove the brace at night to help reduce the risk of blood clots.  Please return for any new, worsening, or change in symptoms or other concerns.  It was a pleasure caring for you today.

## 2023-11-12 NOTE — ED Triage Notes (Signed)
 Pt via POV from home. Pt states her prosthetic leg came unscrewed causing her to fall with all the weight on her R ankle. Pt also c/o L hand pain. Denies head injury. Denies LOC. Pt is A&OX4 and NAD

## 2023-11-12 NOTE — ED Triage Notes (Signed)
 Pt from home, went to HD this am and her prosthetic leg came unscrewed and caused pt to fall, completed HD, went home and noted rt ankle was painful.  182/69

## 2023-12-03 ENCOUNTER — Emergency Department: Payer: 59

## 2023-12-03 ENCOUNTER — Emergency Department
Admission: EM | Admit: 2023-12-03 | Discharge: 2023-12-03 | Disposition: A | Discharge status: Home / Self Care | Payer: 59 | Attending: Emergency Medicine | Admitting: Emergency Medicine

## 2023-12-03 ENCOUNTER — Other Ambulatory Visit: Payer: Self-pay

## 2023-12-03 DIAGNOSIS — N186 End stage renal disease: Secondary | ICD-10-CM | POA: Diagnosis not present

## 2023-12-03 DIAGNOSIS — S8991XA Unspecified injury of right lower leg, initial encounter: Secondary | ICD-10-CM | POA: Diagnosis present

## 2023-12-03 DIAGNOSIS — W19XXXA Unspecified fall, initial encounter: Secondary | ICD-10-CM | POA: Diagnosis not present

## 2023-12-03 DIAGNOSIS — E1122 Type 2 diabetes mellitus with diabetic chronic kidney disease: Secondary | ICD-10-CM | POA: Diagnosis not present

## 2023-12-03 DIAGNOSIS — I132 Hypertensive heart and chronic kidney disease with heart failure and with stage 5 chronic kidney disease, or end stage renal disease: Secondary | ICD-10-CM | POA: Insufficient documentation

## 2023-12-03 DIAGNOSIS — Z794 Long term (current) use of insulin: Secondary | ICD-10-CM | POA: Diagnosis not present

## 2023-12-03 DIAGNOSIS — S82831D Other fracture of upper and lower end of right fibula, subsequent encounter for closed fracture with routine healing: Secondary | ICD-10-CM | POA: Insufficient documentation

## 2023-12-03 DIAGNOSIS — I509 Heart failure, unspecified: Secondary | ICD-10-CM | POA: Diagnosis not present

## 2023-12-03 DIAGNOSIS — Z992 Dependence on renal dialysis: Secondary | ICD-10-CM | POA: Diagnosis not present

## 2023-12-03 DIAGNOSIS — E1142 Type 2 diabetes mellitus with diabetic polyneuropathy: Secondary | ICD-10-CM

## 2023-12-03 MED ORDER — OXYCODONE HCL 5 MG PO CAPS: 5.0000 mg | ORAL_CAPSULE | Freq: Four times a day (QID) | ORAL | 0 refills | Status: AC | PRN

## 2023-12-03 NOTE — ED Notes (Signed)
See triage notes. Patient c/o ankle pain

## 2023-12-03 NOTE — ED Provider Notes (Addendum)
.-----------------------------------------   2:59 PM on 12/03/2023 -----------------------------------------  Blood pressure (!) 172/81, pulse 73, temperature 98 F (36.7 C), resp. rate 19, height 5\' 8"  (1.727 m), weight (!) 136.1 kg, SpO2 95%.  Assuming care from Dr. Scotty Court.  In short, Rachel Holmes is a 63 y.o. female with a chief complaint of Ankle Pain .  Refer to the original H&P for additional details.  The current plan of care is to PT and SW eval for home health. Lives at home, no steps. L. Ankle fracture.  On reassessment patient states she wants to go home.  Called physical therapy but had left for the day.  Placed an order for social work to call her back tomorrow.  Patient states that she has a wheelchair as well as a rolling walker at home and she is able to get around at home since this has been ongoing for the last couple weeks.  States that she is able to pivot.  Has neighbors who can help her out.  Does not want to stay for social services evaluation the next day.  Of her walker here but she states that she just wants to go home.  Told her that she could weight-bear as tolerated.  Follow-up with orthopedic surgeon outpatient.  Otherwise no other indication to stay.  Strict turn precautions given.  Will discharge. Patient also asking for something stronger for pain, shared decision making we will give her a very short course of 5 mg of oxycodone.  Instructed patient to only take it when she is going to bed and she is agreeable with the plan.  No driving or operating heavy machinery or attempting to ambulate with a walker with the oxycodone on board.        Claybon Jabs, MD 12/03/23 1726    Claybon Jabs, MD 12/03/23 [phone]

## 2023-12-03 NOTE — ED Triage Notes (Signed)
Pt here with right ankle pain. Pt was here 2 weeks ago for a fall with negative scans. Pt continues to have pain in that ankle. Pt states she cannot bear weight on it and she thinks she heard a pop in her ankle this morning. Pt arrives with ankle in boot.

## 2023-12-03 NOTE — ED Provider Triage Note (Signed)
Emergency Medicine Provider Triage Evaluation Note  Rachel Holmes , a 63 y.o. female  was evaluated in triage.  Pt complains of right ankle pain.  Review of Systems  Positive:  Negative:   Physical Exam  BP (!) 172/81   Pulse 73   Temp 98 F (36.7 C)   Resp 19   Ht 5\' 8"  (1.727 m)   Wt (!) 136.1 kg   SpO2 95%   BMI 45.62 kg/m  Gen:   Awake, no distress   Resp:  Normal effort  MSK:   Moves extremities without difficulty  Other:    Medical Decision Making  Medically screening exam initiated at 10:54 AM.  Appropriate orders placed.  CHAIRTY TOMAN was informed that the remainder of the evaluation will be completed by another provider, this initial triage assessment does not replace that evaluation, and the importance of remaining in the ED until their evaluation is complete.     Faythe Ghee, PA-C 12/03/23 1055

## 2023-12-03 NOTE — ED Provider Notes (Signed)
[facility] Provider Note    Event Date/Time   First MD Initiated Contact with Patient 12/03/23 1347     (approximate)   History   Chief Complaint: Ankle Pain   HPI  Rachel Holmes is a 63 y.o. female with a history of hypertension diabetes morbid obesity ESRD on dialysis CHF who comes ED complaining of right ankle pain.  She had a fall 3 weeks ago, came to the ED had an x-ray which was reported as negative, she was placed in a walking boot at that time.  She has been ambulating with a cane, walking boot, and her left leg which is status post BKA with prosthetic lower leg, and today she felt a pop in the ankle, with increased pain.  No fall or new trauma          Physical Exam   Triage Vital Signs: ED Triage Vitals  Encounter Vitals Group     BP 12/03/23 1051 (!) 172/81     Systolic BP Percentile --      Diastolic BP Percentile --      Pulse Rate 12/03/23 1050 73     Resp 12/03/23 1050 19     Temp 12/03/23 1050 98 F (36.7 C)     Temp src --      SpO2 12/03/23 1050 95 %     Weight 12/03/23 1050 (!) 300 lb 0.7 oz (136.1 kg)     Height 12/03/23 1050 5\' 8"  (1.727 m)     Head Circumference --      Peak Flow --      Pain Score 12/03/23 1050 10     Pain Loc --      Pain Education --      Exclude from Growth Chart --     Most recent vital signs: Vitals:   12/03/23 1050 12/03/23 1051  BP:  (!) 172/81  Pulse: 73   Resp: 19   Temp: 98 F (36.7 C)   SpO2: 95%     General: Awake, no distress.  CV:  Good peripheral perfusion.  Normal distal pulse Resp:  Normal effort.  Abd:  No distention.  Other:  Tenderness over the right distal fibula proximal to the malleolus, no wound.  Mild swelling   ED Results / Procedures / Treatments   Labs (all labs ordered are listed, but only abnormal results are displayed) Labs Reviewed - No data to display   EKG    RADIOLOGY X-ray right fibula interpreted by me, shows oblique mildly displaced  fracture distal right fibula.  Radiology report reviewed   PROCEDURES:  Procedures  MEDICATIONS ORDERED IN ED: Medications - No data to display   IMPRESSION / MDM / ASSESSMENT AND PLAN / ED COURSE  I reviewed the triage vital signs and the nursing notes.  Patient's presentation is most consistent with acute illness / injury with system symptoms.  Patient presents with right ankle pain in the setting of recent injury, now with new swelling and tenderness.  X-ray demonstrates a mildly displaced fracture.  I discussed this with orthopedics Dr. Steward Drone who advises that this fracture was visible and present on the x-rays from 3 weeks ago and he does not think this is any new or worsened injury but rather the expected interval appearance and healing course.  He thinks current management is appropriate including walking boot, weightbearing as tolerated, and with her multiple chronic comorbidities, would prioritize nonoperative management.  This was discussed with the  patient.  Her multiple chronic medical conditions all appear to be at baseline currently without acute symptoms.  Since she lives alone and has this compounding her mobility issues, will request PT evaluation for recommendations and social work for home health needs if needed.       FINAL CLINICAL IMPRESSION(S) / ED DIAGNOSES   Final diagnoses:  Closed fracture of distal end of right fibula with routine healing, unspecified fracture morphology, subsequent encounter  ESRD on hemodialysis (HCC)  Morbid obesity (HCC)  Type 2 diabetes mellitus with diabetic polyneuropathy, with long-term current use of insulin (HCC)     Rx / DC Orders   ED Discharge Orders     None        Note:  This document was prepared using Dragon voice recognition software and may include unintentional dictation errors.   Sharman Cheek, MD 12/03/23 [phone]

## 2023-12-18 ENCOUNTER — Other Ambulatory Visit (HOSPITAL_BASED_OUTPATIENT_CLINIC_OR_DEPARTMENT_OTHER): Payer: Self-pay | Admitting: Orthopedic Surgery

## 2023-12-18 ENCOUNTER — Other Ambulatory Visit: Payer: Self-pay | Admitting: Orthopedic Surgery

## 2023-12-18 ENCOUNTER — Ambulatory Visit
Admission: RE | Admit: 2023-12-18 | Discharge: 2023-12-18 | Disposition: A | Discharge status: Home / Self Care | Payer: 59 | Source: Ambulatory Visit | Attending: Orthopedic Surgery

## 2023-12-18 ENCOUNTER — Encounter: Payer: Self-pay | Admitting: Orthopedic Surgery

## 2023-12-18 ENCOUNTER — Ambulatory Visit: Payer: 59 | Admitting: Orthopedic Surgery

## 2023-12-18 ENCOUNTER — Ambulatory Visit
Admission: RE | Admit: 2023-12-18 | Discharge: 2023-12-18 | Disposition: A | Discharge status: Home / Self Care | Payer: 59 | Attending: Orthopedic Surgery | Admitting: Orthopedic Surgery

## 2023-12-18 ENCOUNTER — Ambulatory Visit (INDEPENDENT_AMBULATORY_CARE_PROVIDER_SITE_OTHER): Payer: 59 | Admitting: Orthopedic Surgery

## 2023-12-18 DIAGNOSIS — S82831A Other fracture of upper and lower end of right fibula, initial encounter for closed fracture: Secondary | ICD-10-CM

## 2023-12-18 MED ORDER — TRAMADOL HCL 50 MG PO TABS
50.0000 mg | ORAL_TABLET | Freq: Two times a day (BID) | ORAL | 0 refills | Status: DC | PRN
Start: 2023-12-18 — End: 2024-01-27

## 2023-12-18 MED ORDER — TRAMADOL HCL 100 MG PO TABS: 100.0000 mg | ORAL_TABLET | Freq: Two times a day (BID) | ORAL | 0 refills | Status: DC | PRN

## 2023-12-18 NOTE — Progress Notes (Signed)
New Patient Visit  Assessment: Rachel Holmes is a 63 y.o. female with the following: 1. Other closed fracture of distal end of right fibula, initial encounter    Plan: Rachel Holmes sustained a right distal fibula fracture, after a fall approximately 1 month ago.  She has been in a walking boot.  Limited weightbearing.  She continues to have some pain and swelling.  She has been using the boot at all times.  Radiographs were obtained after clinic visit today.  Overall remains stable.  Mortise is congruent.  No operative indication.  Continue with use of the boot.  Follow-up in 1 month with repeat x-rays.  Follow-up: Return in about 4 weeks (around 01/15/2024).  Subjective:  Chief Complaint  Patient presents with   Ankle Injury    12/02/23 ED visit dx with R distal fibula fracture after feeling a pop in ankle. Per report- Interval development of minimally displaced distal right fibular fracture.  11/12/23 ED visit s/p fall, xrays neg for fracture.   Hurting, needs some medication for pain. WBAT in boot, has cane/walker at home to use.     History of Present Illness: Rachel Holmes is a 63 y.o. female who presents for evaluation of right ankle pain.  She states that she fell about a month ago.  She was evaluated the emergency department.  Radiographs demonstrated a 9 displaced distal fibula fracture.  She was placed in a walking boot.  She has remained in a walking boot.  Limited weightbearing thus far.  She continues to have pain.  She notes ongoing swelling.  She has a prosthetic left leg, which is also limiting her mobility.  She states she has some numbness and tingling to her lesser toes.   Review of Systems: No fevers or chills + numbness or tingling No chest pain No shortness of breath No bowel or bladder dysfunction No GI distress No headaches   Medical History:  Past Medical History:  Diagnosis Date   Allergic rhinitis    Allergy    Anemia    Anxiety     Aortic stenosis 09/13/2016   a.) TTE 09/13/2016: mild AS --> MPG 11.2 mmHg. b.) TTE 12/27/2020: EF 55-60%; no AS --> MPG 8 mmHg.   Aortic valve endocarditis 12/25/2018   a.) TTE 12/25/2018: 0.75 x 1 cm AV mass. b.) TEE 12/28/2018: small shaggy mobile density on aortic side of RIGHT coronary cusp consistent with vegetation.   Arthritis    Back pain    Bradycardia    Breast mass    Patient can no longer palpate specific masses but showed tech general area of concern   Charcot's joint of ankle, left    CHF (congestive heart failure) (HCC) 07/09/2007   a.) TTE 07/09/2007: EF 50%; G1DD. b.) TTE 09/13/2016: EF 55%; G2DD. c.) TTE 12/27/2020: EF 55-60%; G2DD; GLS -15.1%.   CKD (chronic kidney disease), stage III (HCC)    Complication of anesthesia    a.) PONV. b.) Delayed emergence   Constipation    Diabetic nephropathy (HCC)    DVT (deep venous thrombosis) (HCC)    Dyspnea    Family history of adverse reaction to anesthesia    a.) Sisters x 2 with (+) delayed emergence.   GERD (gastroesophageal reflux disease)    Heart murmur    HLD (hyperlipidemia)    Hyperparathyroidism (HCC)    Hypertension    Joint pain    Leg edema    Legally blind in left eye, as  defined in [place]    Lymphedema    Mild pulmonary hypertension (HCC) 09/13/2016   a.) TTE 09/13/2016: EF 55%; RVSP 45 mmHg. b.) TTE 12/25/2018: EF 55-60%; RVSP 35.5 mmHg.   Obesity    Onychomycosis    PONV (postoperative nausea and vomiting)    S/P BKA (below knee amputation) unilateral, left (HCC)    Sepsis (HCC) 12/2018   a.) group G streptococcal bacteremia with AV endocarditis and LLE soft tissue infection.   T2DM (type 2 diabetes mellitus) [facility])     Past Surgical History:  Procedure Laterality Date   A/V FISTULAGRAM Left 06/18/2022   Procedure: A/V Fistulagram;  Surgeon: Renford Dills, MD;  Location: [facility] INVASIVE CV [facility];  Service: Cardiovascular;  Laterality: Left;   A/V FISTULAGRAM Left 08/13/2022   Procedure: A/V  Fistulagram;  Surgeon: Renford Dills, MD;  Location: [facility] IN[facility] CV [facility];  Service: Cardiovascular;  Laterality: Left;   ABDOMINAL HYSTERECTOMY     AMPUTATION Left 05/05/2019   Procedure: AMPUTATION BELOW KNEE;  Surgeon: Renford Dills, MD;  Location: [facility] ORS;  Service: Vascular;  Laterality: Left;   AV FISTULA PLACEMENT Right 09/19/2021   Procedure: ARTERIOVENOUS (AV) FISTULA CREATION (BRACHIAL CEPHALIC);  Surgeon: Renford Dills, MD;  Location: [facility] ORS;  Service: Vascular;  Laterality: Right;   AV FISTULA PLACEMENT Left 11/09/2021   Procedure: ARTERIOVENOUS (AV) FISTULA CREATION ( BRACHIAL CEPHALIC );  Surgeon: Renford Dills, MD;  Location: [facility] ORS;  Service: Vascular;  Laterality: Left;   BREAST BIOPSY Left 2014   FNA 12:00 position - Negative   DIALYSIS/PERMA CATHETER INSERTION N/A 03/28/2022   Procedure: DIALYSIS/PERMA CATHETER INSERTION;  Surgeon: Annice Needy, MD;  Location: [facility];  Service: Cardiovascular;  Laterality: N/A;   DIALYSIS/PERMA CATHETER REMOVAL N/A 12/09/2022   Procedure: DIALYSIS/PERMA CATHETER REMOVAL;  Surgeon: Annice Needy, MD;  Location: [facility];  Service: Cardiovascular;  Laterality: N/A;   EYE SURGERY Left 2007   removed a lens, no lens implanted   IR FLUORO GUIDE CV LINE RIGHT  12/29/2018   IR REMOVAL TUN CV CATH W/O FL  02/19/2019   IR US GUIDE VASC ACCESS RIGHT  12/29/2018   REVISON OF ARTERIOVENOUS FISTULA Left 10/16/2022   Procedure: REVISON OF ARTERIOVENOUS FISTULA;  Surgeon: Renford Dills, MD;  Location: [facility] ORS;  Service: Vascular;  Laterality: Left;   TEE WITHOUT CARDIOVERSION N/A 12/28/2018   Procedure: TRANSESOPHAGEAL ECHOCARDIOGRAM (TEE);  Surgeon: Quintella Reichert, MD;  Location: [facility] ENDOSCOPY;  Service: Cardiovascular;  Laterality: N/A;    Family History  Problem Relation Age of Onset   Breast cancer Sister 18   Diabetes Sister    Diabetes Mother    Hypertension Mother    Hyperlipidemia Mother     Eating disorder Mother    Obesity Mother    Social History   Tobacco Use   Smoking status: Never   Smokeless tobacco: Never  Vaping Use   Vaping status: Never Used  Substance Use Topics   Alcohol use: No   Drug use: Never    Allergies  Allergen Reactions   Nifedipine Rash   Penicillins Hives, Shortness Of Breath and Swelling   Statins Shortness Of Breath    Wheezing   Hydralazine Itching   Hydrocodone Itching and Nausea Only   Oxycodone Nausea Only   Codeine Nausea Only   Ibuprofen Other (See Comments)    Raises blood pressure    Current Meds  Medication Sig  acetaminophen (TYLENOL) 500 MG tablet Take 1,000 mg by mouth every 6 (six) hours as needed.   albuterol (PROVENTIL HFA;VENTOLIN HFA) 108 (90 Base) MCG/ACT inhaler Inhale 1-2 puffs into the lungs every 6 (six) hours as needed for wheezing or shortness of breath.   aspirin EC 81 MG EC tablet Take 1 tablet (81 mg total) by mouth daily.   cetirizine (ZYRTEC) 10 MG tablet Take 10 mg by mouth at bedtime.   clobetasol cream (TEMOVATE) 0.05 % Apply 1 application  topically 2 (two) times daily as needed (rash).   cloNIDine (CATAPRES) 0.3 MG tablet Take 1 tablet (0.3 mg total) by mouth 2 (two) times daily.   Continuous Blood Gluc Receiver (FREESTYLE LIBRE 14 DAY READER) DEVI USE AS DIRECTED   docusate sodium (COLACE) 100 MG capsule Take 1 capsule (100 mg total) by mouth 2 (two) times daily.   DULoxetine (CYMBALTA) 30 MG capsule Take 30 mg by mouth at bedtime.   gabapentin (NEURONTIN) 600 MG tablet Take 600 mg by mouth at bedtime.   glucosamine-chondroitin 500-400 MG tablet Take 1 tablet by mouth in the morning and at bedtime.   hydrOXYzine (ATARAX) 25 MG tablet Take 25 mg by mouth daily.   insulin lispro (HUMALOG KWIKPEN) 100 UNIT/ML KwikPen 3 times a day (just before each meal) 16-18-19 units, and pen needles 3 day. (Patient taking differently: Inject 24 Units into the skin 3 (three) times daily with meals.)   isosorbide  mononitrate (IMDUR) 120 MG 24 hr tablet Take 1 tablet (120 mg total) by mouth daily. (Patient taking differently: Take 120 mg by mouth in the morning and at bedtime.)   lactulose (CHRONULAC) 10 GM/15ML solution Take 15 mLs by mouth daily as needed.   Lancets (ONETOUCH ULTRASOFT) lancets Used to check blood sugars four times daily.   Magnesium 400 MG CAPS Take 1 capsule by mouth at bedtime.   Multiple Vitamin (MULTIVITAMIN WITH MINERALS) TABS tablet Take 1 tablet by mouth daily.   olmesartan (BENICAR) 40 MG tablet Take 40 mg by mouth at bedtime.   omeprazole (PRILOSEC) 40 MG capsule Take 40 mg by mouth 2 (two) times daily with breakfast and lunch.   ONETOUCH VERIO test strip USE TO CHECK BLOOD SUGAR TWICE DAILY   OZEMPIC, 0.25 OR 0.5 MG/DOSE, 2 MG/3ML SOPN Inject into the skin.   polyethylene glycol powder (GLYCOLAX/MIRALAX) 17 GM/SCOOP powder Take 17 g by mouth 2 (two) times daily as needed for mild constipation.   torsemide (DEMADEX) 100 MG tablet Take 1 tablet (100 mg total) by mouth 2 (two) times daily.   traMADol HCl 100 MG TABS Take 100 mg by mouth every 12 (twelve) hours as needed (Pain).   vitamin B-12 (CYANOCOBALAMIN) 1000 MCG tablet Take 1,000 mcg by mouth daily.    Objective: There were no vitals taken for this visit.  Physical Exam:  General: Alert and oriented., No acute distress., and Seated in a wheelchair. Gait: Unable to ambulate.  Diffuse swelling about the right ankle.  Mild tenderness palpation over the distal fibula.  Mild tenderness palpation of the medial ankle.  She tolerates dorsiflexion to plantigrade position.  Decree sensation to her lesser toes.  Sensation intact in the dorsum of her foot.  IMAGING: I personally ordered and reviewed the following images  X-rays of the right ankle demonstrates minimally displaced fracture of the distal fibula.  There are also appears to be a minimally displaced fracture of the medial malleolus.  Mortise is congruent.   New  Medications:  Meds ordered this encounter  Medications   traMADol HCl 100 MG TABS    Sig: Take 100 mg by mouth every 12 (twelve) hours as needed (Pain).    Dispense:  30 tablet    Refill:  0      Oliver Barre, MD  12/18/2023 10:03 AM

## 2023-12-18 NOTE — Addendum Note (Signed)
Addended by: Thane Edu A on: 12/18/2023 10:58 PM   Modules accepted: Orders

## 2023-12-18 NOTE — Patient Instructions (Signed)
you. Hold this position for 5 seconds. Slowly release the tension in the band or tube, controlling smoothly until your foot is back in the starting position. Repeat 10 times. Complete this exercise 2-3 times a day.  Towel curls  Sit in a chair on a non-carpeted surface, and put your feet on the floor. Place a towel in front of your feet. Keeping your heel on the floor, put your R foot on the towel. Pull the towel toward you by grabbing the towel with your toes and curling them under. Keep your heel on the floor. Let your toes relax. Grab the towel again. Keep pulling the  towel until it is completely underneath your foot. Repeat 10 times. Complete this exercise 2-3 times a day.  Standing plantar flexion This is an exercise in which you use your toes to lift your body's weight while standing. Stand with your feet shoulder-width apart. Keep your weight spread evenly over the width of your feet while you rise up on your toes. Use a wall or table to steady yourself if needed, but try not to use it for support. If this exercise is too easy, try these options: Shift your weight toward your R leg until you feel challenged. If told by your health care provider, lift your uninjured leg off the floor. Hold this position for 5 seconds. Repeat 10 times. Complete this exercise 2-3 times a day.  Tandem walking Stand with one foot directly in front of the other. Slowly raise your back foot up, lifting your heel before your toes, and place it directly in front of your other foot. Continue to walk in this heel-to-toe way. Have a countertop or wall nearby to use if needed to keep your balance, but try not to hold onto anything for support.  Repeat 10 times. Complete this exercise 2-3 times a day.   Document Revised: 07/18/2018 Document Reviewed: 07/20/2018 Elsevier Patient Education  2020 [employer].

## 2024-01-03 DIAGNOSIS — S82841A Displaced bimalleolar fracture of right lower leg, initial encounter for closed fracture: Secondary | ICD-10-CM

## 2024-01-03 HISTORY — DX: Displaced bimalleolar fracture of right lower leg, initial encounter for closed fracture: S82.841A

## 2024-01-13 ENCOUNTER — Other Ambulatory Visit: Payer: Self-pay | Admitting: Orthopedic Surgery

## 2024-01-13 DIAGNOSIS — S82831A Other fracture of upper and lower end of right fibula, initial encounter for closed fracture: Secondary | ICD-10-CM

## 2024-01-14 ENCOUNTER — Ambulatory Visit
Admission: RE | Admit: 2024-01-14 | Discharge: 2024-01-14 | Disposition: A | Discharge status: Home / Self Care | Source: Ambulatory Visit | Attending: Orthopedic Surgery | Admitting: Orthopedic Surgery

## 2024-01-14 ENCOUNTER — Ambulatory Visit
Admission: RE | Admit: 2024-01-14 | Discharge: 2024-01-14 | Disposition: A | Discharge status: Home / Self Care | Attending: Orthopedic Surgery | Admitting: Orthopedic Surgery

## 2024-01-14 DIAGNOSIS — S82831A Other fracture of upper and lower end of right fibula, initial encounter for closed fracture: Secondary | ICD-10-CM | POA: Diagnosis present

## 2024-01-15 ENCOUNTER — Encounter: Payer: Self-pay | Admitting: Orthopedic Surgery

## 2024-01-15 ENCOUNTER — Ambulatory Visit: Payer: 59 | Admitting: Orthopedic Surgery

## 2024-01-15 DIAGNOSIS — S82841G Displaced bimalleolar fracture of right lower leg, subsequent encounter for closed fracture with delayed healing: Secondary | ICD-10-CM

## 2024-01-15 DIAGNOSIS — Z01818 Encounter for other preprocedural examination: Secondary | ICD-10-CM

## 2024-01-15 NOTE — H&P (View-Only) (Signed)
 Return patient Visit  Assessment: Rachel Holmes is a 63 y.o. female with the following: Right bimalleolar ankle fracture    Plan: Rachel Holmes sustained a right distal fibula fracture, after a fall approximately 2 months ago.  This is the second time I have seen her in clinic since sustaining the injury.  Unfortunately, the radiographs continue to demonstrate further displacement.  She has a distal fibula fracture, with angulation, as well as shortening.  There is also a medial malleolus fracture fragment that is not healing.  She has been walking on her right ankle, without a boot.  This is likely worsening her deformity.  She does have some neuropathy.  She is a diabetic, and is on dialysis.  Due to the progressive worsening nature of the radiographs, as well as the appearance of a nonhealing fractures, I recommended operative fixation.  Will plan to proceed with ORIF in a week.  This may require an osteotomy, with plate and screw construct in both the medial lateral ankle.  The procedure was discussed in detail.  OR time has been requested.  Risks and benefits of surgery, including, but not limited to infection, bleeding, persistent pain, damage to surrounding structures, need for further surgery, malunion, nonunion and more severe complications associated with anesthesia were discussed.  All questions have been answered and they have elected to proceed with surgery.    Due to her medical issues, she is at risk for developing infection, poor healing of the bone, as well as possible amputation.      Follow-up: Return for After surgery; DOS 01/22/24.  Subjective:  Chief Complaint  Patient presents with   Ankle Injury     Ankle Injury  12/02/23 ED visit dx with R distal fibula fracture after feeling a pop in ankle. Per report- Interval development of minimally displaced distal right fibular fracture.  11/12/23 ED visit s/p fall, xrays neg for fracture.   Hurting, needs some  medication for pain. WBAT in boot, has cane/walker at home to use.   Today not wearing boot, is in wheelchair.  Says pain med and lat ankle.  Still swelling, is wearing regular shoes. Bottom of heel hurts.  Taking tramadol at night.       History of Present Illness: Rachel Holmes is a 63 y.o. female who returns for evaluation of right ankle pain.  She fell and sustained a distal fibula fracture, without noticeable displacement, approximately 2 months ago.  Since then, she has had progressively worsening radiographs.  She continues to have swelling.  She has been using a walking boot.  She presents to clinic today without the boot.  She has been walking at home.  She continues to have pain.  She has noticed a lot of swelling about the ankle.    Review of Systems: No fevers or chills + numbness or tingling No chest pain No shortness of breath No bowel or bladder dysfunction No GI distress No headaches    Objective: There were no vitals taken for this visit.  Physical Exam:  General: Alert and oriented., No acute distress., and Seated in a wheelchair. Gait: Unable to ambulate.  Diffuse swelling about the right ankle.  Tenderness over the medial lateral ankle.  Decreased sensation from the ankle distal.  Toes warm and well-perfused.  No bruising is appreciated.  IMAGING: I personally ordered and reviewed the following images  X-rays of the right ankle were obtained.  These are compared to prior x-rays.  There has been  progressive worsening of the distal fibula fracture, with angulation, as well as shortening.  There is a fracture of the medial malleolus, without intra-articular involvement, that is progressively worsening.   New Medications:  No orders of the defined types were placed in this encounter.     Oliver Barre, MD  01/15/2024 11:23 AM

## 2024-01-15 NOTE — Progress Notes (Signed)
 Return patient Visit  Assessment: Rachel Holmes is a 63 y.o. female with the following: Right bimalleolar ankle fracture    Plan: Rachel Holmes sustained a right distal fibula fracture, after a fall approximately 2 months ago.  This is the second time I have seen her in clinic since sustaining the injury.  Unfortunately, the radiographs continue to demonstrate further displacement.  She has a distal fibula fracture, with angulation, as well as shortening.  There is also a medial malleolus fracture fragment that is not healing.  She has been walking on her right ankle, without a boot.  This is likely worsening her deformity.  She does have some neuropathy.  She is a diabetic, and is on dialysis.  Due to the progressive worsening nature of the radiographs, as well as the appearance of a nonhealing fractures, I recommended operative fixation.  Will plan to proceed with ORIF in a week.  This may require an osteotomy, with plate and screw construct in both the medial lateral ankle.  The procedure was discussed in detail.  OR time has been requested.  Risks and benefits of surgery, including, but not limited to infection, bleeding, persistent pain, damage to surrounding structures, need for further surgery, malunion, nonunion and more severe complications associated with anesthesia were discussed.  All questions have been answered and they have elected to proceed with surgery.    Due to her medical issues, she is at risk for developing infection, poor healing of the bone, as well as possible amputation.      Follow-up: Return for After surgery; DOS 01/22/24.  Subjective:  Chief Complaint  Patient presents with   Ankle Injury     Ankle Injury  12/02/23 ED visit dx with R distal fibula fracture after feeling a pop in ankle. Per report- Interval development of minimally displaced distal right fibular fracture.  11/12/23 ED visit s/p fall, xrays neg for fracture.   Hurting, needs some  medication for pain. WBAT in boot, has cane/walker at home to use.   Today not wearing boot, is in wheelchair.  Says pain med and lat ankle.  Still swelling, is wearing regular shoes. Bottom of heel hurts.  Taking tramadol at night.       History of Present Illness: Rachel Holmes is a 63 y.o. female who returns for evaluation of right ankle pain.  She fell and sustained a distal fibula fracture, without noticeable displacement, approximately 2 months ago.  Since then, she has had progressively worsening radiographs.  She continues to have swelling.  She has been using a walking boot.  She presents to clinic today without the boot.  She has been walking at home.  She continues to have pain.  She has noticed a lot of swelling about the ankle.    Review of Systems: No fevers or chills + numbness or tingling No chest pain No shortness of breath No bowel or bladder dysfunction No GI distress No headaches    Objective: There were no vitals taken for this visit.  Physical Exam:  General: Alert and oriented., No acute distress., and Seated in a wheelchair. Gait: Unable to ambulate.  Diffuse swelling about the right ankle.  Tenderness over the medial lateral ankle.  Decreased sensation from the ankle distal.  Toes warm and well-perfused.  No bruising is appreciated.  IMAGING: I personally ordered and reviewed the following images  X-rays of the right ankle were obtained.  These are compared to prior x-rays.  There has been  progressive worsening of the distal fibula fracture, with angulation, as well as shortening.  There is a fracture of the medial malleolus, without intra-articular involvement, that is progressively worsening.   New Medications:  No orders of the defined types were placed in this encounter.     Oliver Barre, MD  01/15/2024 11:23 AM

## 2024-01-16 ENCOUNTER — Encounter
Admission: RE | Admit: 2024-01-16 | Discharge: 2024-01-16 | Disposition: A | Discharge status: Home / Self Care | Source: Ambulatory Visit | Attending: Orthopedic Surgery | Admitting: Orthopedic Surgery

## 2024-01-16 ENCOUNTER — Other Ambulatory Visit: Payer: Self-pay

## 2024-01-16 DIAGNOSIS — E1151 Type 2 diabetes mellitus with diabetic peripheral angiopathy without gangrene: Secondary | ICD-10-CM

## 2024-01-16 DIAGNOSIS — E119 Type 2 diabetes mellitus without complications: Secondary | ICD-10-CM

## 2024-01-16 DIAGNOSIS — N186 End stage renal disease: Secondary | ICD-10-CM

## 2024-01-16 HISTORY — DX: Other fracture of upper and lower end of right fibula, initial encounter for closed fracture: S82.831A

## 2024-01-16 NOTE — Patient Instructions (Addendum)
 Your procedure is scheduled on: 01/22/24 - Thursday Report to the Registration Desk on the 1st floor of the [facility]. To find out your arrival time, please call [phone] between 1PM - 3PM on: 01/21/24 - Wednesday If your arrival time is 6:00 am, do not arrive before that time as the [facility] entrance doors do not open until 6:00 am.  REMEMBER: Instructions that are not followed completely may result in serious medical risk, up to and including death; or upon the discretion of your surgeon and anesthesiologist your surgery may need to be rescheduled.  Do not eat food after midnight the night before surgery.  No gum chewing or hard candies.  You may however, drink CLEAR liquids up to 2 hours before you are scheduled to arrive for your surgery. Do not drink anything within 2 hours of your scheduled arrival time.  Clear liquids include: - water   In addition, your doctor has ordered for you to drink the provided:  Gatorade G2 Drinking this carbohydrate drink up to two hours before surgery helps to reduce insulin resistance and improve patient outcomes. Please complete drinking 2 hours before scheduled arrival time.  One week prior to surgery: Stop Anti-inflammatories (NSAIDS) such as Advil, Aleve, Ibuprofen, Motrin, Naproxen, Naprosyn and Aspirin based products such as Excedrin, Goody's Powder, BC Powder.  Stop ANY OVER THE COUNTER supplements until after surgery.glucosamine-chondroitin ,Magnesium ,MULTIVITAMIN .  You may however, continue to take Tylenol if needed for pain up until the day of surgery.  Inject only 1/2 of your insulin degludec (TRESIBA) on the night before your surgery.  HOLD the morning dose of your L[facility]UMJ[facility] on the day of surgery.  HOLD MOUNJARO 7 days prior to your surgery  HOLD  ON THE DAY OF SURGERY ONLY TAKE THESE MEDICATIONS WITH SIPS OF WATER:  cloNIDine (CATAPRES)  isosorbide mononitrate (IMDUR)  omeprazole (PRILOSEC)   Use inhalers on  the day of surgery and bring to the hospital.   No Alcohol for 24 hours before or after surgery.  No Smoking including e-cigarettes for 24 hours before surgery.  No chewable tobacco products for at least 6 hours before surgery.  No nicotine patches on the day of surgery.  Do not use any "recreational" drugs for at least a week (preferably 2 weeks) before your surgery.  Please be advised that the combination of cocaine and anesthesia may have negative outcomes, up to and including death. If you test positive for cocaine, your surgery will be cancelled.  On the morning of surgery brush your teeth with toothpaste and water, you may rinse your mouth with mouthwash if you wish. Do not swallow any toothpaste or mouthwash.  Use CHG Soap or wipes as directed on instruction sheet.  Do not wear jewelry, make-up, hairpins, clips or nail polish.  For welded (permanent) jewelry: bracelets, anklets, waist bands, etc.  Please have this removed prior to surgery.  If it is not removed, there is a chance that hospital personnel will need to cut it off on the day of surgery.  Do not wear lotions, powders, or perfumes.   Do not shave body hair from the neck down 48 hours before surgery.  Contact lenses, hearing aids and dentures may not be worn into surgery.  Do not bring valuables to the hospital. [facility] is not responsible for any missing/lost belongings or valuables.   Notify your doctor if there is any change in your medical condition (cold, fever, infection).  Wear comfortable clothing (specific  to your surgery type) to the hospital.  After surgery, you can help prevent lung complications by doing breathing exercises.  Take deep breaths and cough every 1-2 hours. Your doctor may order a device called an Incentive Spirometer to help you take deep breaths. When coughing or sneezing, hold a pillow firmly against your incision with both hands. This is called "splinting." Doing this helps protect  your incision. It also decreases belly discomfort.  If you are being admitted to the hospital overnight, leave your suitcase in the car. After surgery it may be brought to your room.  In case of increased patient census, it may be necessary for you, the patient, to continue your postoperative care in the Same Day Surgery department.  If you are being discharged the day of surgery, you will not be allowed to drive home. You will need a responsible individual to drive you home and stay with you for 24 hours after surgery.   If you are taking public transportation, you will need to have a responsible individual with you.  Please call the Pre-admissions Testing Dept. at [phone] if you have any questions about these instructions.  Surgery Visitation Policy:  Patients having surgery or a procedure may have two visitors.  Children under the age of 40 must have an adult with them who is not the patient.  Temporary Visitor Restrictions Due to increasing cases of flu, RSV and COVID-19: Children ages 55 and under will not be able to visit patients in [facility] hospitals under most circumstances.  Inpatient Visitation:    Visiting hours are 7 a.m. to 8 p.m. Up to four visitors are allowed at one time in a patient room. The visitors may rotate out with other people during the day.  One visitor age 10 or older may stay with the patient overnight and must be in the room by 8 p.m.    Preparing for Surgery with CHLORHEXIDINE GLUCONATE (CHG) Soap  Chlorhexidine Gluconate (CHG) Soap  o An antiseptic cleaner that kills germs and bonds with the skin to continue killing germs even after washing  o Used for showering the night before surgery and morning of surgery  Before surgery, you can play an important role by reducing the number of germs on your skin.  CHG (Chlorhexidine gluconate) soap is an antiseptic cleanser which kills germs and bonds with the skin to continue killing germs even  after washing.  Please do not use if you have an allergy to CHG or antibacterial soaps. If your skin becomes reddened/irritated stop using the CHG.  1. Shower the NIGHT BEFORE SURGERY and the MORNING OF SURGERY with CHG soap.  2. If you choose to wash your hair, wash your hair first as usual with your normal shampoo.  3. After shampooing, rinse your hair and body thoroughly to remove the shampoo.  4. Use CHG as you would any other liquid soap. You can apply CHG directly to the skin and wash gently with a scrungie or a clean washcloth.  5. Apply the CHG soap to your body only from the neck down. Do not use on open wounds or open sores. Avoid contact with your eyes, ears, mouth, and genitals (private parts). Wash face and genitals (private parts) with your normal soap.  6. Wash thoroughly, paying special attention to the area where your surgery will be performed.  7. Thoroughly rinse your body with warm water.  8. Do not shower/wash with your normal soap after using and rinsing off  the CHG soap.  9. Pat yourself dry with a clean towel.  10. Wear clean pajamas to bed the night before surgery.  12. Place clean sheets on your bed the night of your first shower and do not sleep with pets.  13. Shower again with the CHG soap on the day of surgery prior to arriving at the hospital.  14. Do not apply any deodorants/lotions/powders.  15. Please wear clean clothes to the hospital.

## 2024-01-20 ENCOUNTER — Encounter: Payer: Self-pay | Admitting: Orthopedic Surgery

## 2024-01-20 ENCOUNTER — Encounter
Admission: RE | Admit: 2024-01-20 | Discharge: 2024-01-20 | Disposition: A | Discharge status: Home / Self Care | Source: Ambulatory Visit | Attending: Orthopedic Surgery | Admitting: Orthopedic Surgery

## 2024-01-20 DIAGNOSIS — X58XXXA Exposure to other specified factors, initial encounter: Secondary | ICD-10-CM | POA: Diagnosis not present

## 2024-01-20 DIAGNOSIS — Z992 Dependence on renal dialysis: Secondary | ICD-10-CM | POA: Insufficient documentation

## 2024-01-20 DIAGNOSIS — E1165 Type 2 diabetes mellitus with hyperglycemia: Secondary | ICD-10-CM | POA: Diagnosis not present

## 2024-01-20 DIAGNOSIS — E1151 Type 2 diabetes mellitus with diabetic peripheral angiopathy without gangrene: Secondary | ICD-10-CM

## 2024-01-20 DIAGNOSIS — Z01812 Encounter for preprocedural laboratory examination: Secondary | ICD-10-CM | POA: Insufficient documentation

## 2024-01-20 DIAGNOSIS — E1122 Type 2 diabetes mellitus with diabetic chronic kidney disease: Secondary | ICD-10-CM | POA: Insufficient documentation

## 2024-01-20 DIAGNOSIS — S82841A Displaced bimalleolar fracture of right lower leg, initial encounter for closed fracture: Secondary | ICD-10-CM | POA: Diagnosis not present

## 2024-01-20 DIAGNOSIS — N186 End stage renal disease: Secondary | ICD-10-CM | POA: Insufficient documentation

## 2024-01-20 DIAGNOSIS — Z01818 Encounter for other preprocedural examination: Secondary | ICD-10-CM

## 2024-01-20 LAB — CBC
HCT: 32.7 % — ABNORMAL LOW (ref 36.0–46.0)
Hemoglobin: 10.3 g/dL — ABNORMAL LOW (ref 12.0–15.0)
MCH: 29.7 pg (ref 26.0–34.0)
MCHC: 31.5 g/dL (ref 30.0–36.0)
MCV: 94.2 fL (ref 80.0–100.0)
Platelets: 229 10*3/uL (ref 150–400)
RBC: 3.47 MIL/uL — ABNORMAL LOW (ref 3.87–5.11)
RDW: 14.7 % (ref 11.5–15.5)
WBC: 7.2 10*3/uL (ref 4.0–10.5)
nRBC: 0 % (ref 0.0–0.2)

## 2024-01-20 LAB — BASIC METABOLIC PANEL
Anion gap: 12 (ref 5–15)
BUN: 38 mg/dL — ABNORMAL HIGH (ref 8–23)
CO2: 24 mmol/L (ref 22–32)
Calcium: 8.6 mg/dL — ABNORMAL LOW (ref 8.9–10.3)
Chloride: 102 mmol/L (ref 98–111)
Creatinine, Ser: 4.38 mg/dL — ABNORMAL HIGH (ref 0.44–1.00)
GFR, Estimated: 11 mL/min — ABNORMAL LOW (ref >60)
Glucose, Bld: 96 mg/dL (ref 70–99)
Potassium: 4.1 mmol/L (ref 3.5–5.1)
Sodium: 138 mmol/L (ref 135–145)

## 2024-01-20 NOTE — Progress Notes (Signed)
 Perioperative / Anesthesia Services  Pre-Admission Testing Clinical Review / Pre-Operative Anesthesia Consult  Date: 01/21/24  Patient Demographics:  Name: Rachel Holmes DOB: [birth year] MRN:   [id number]  Planned Surgical Procedure(s):    Case: [id number] Date/Time: 01/22/24 1200   Procedure: OPEN REDUCTION INTERNAL FIXATION (ORIF) ANKLE FRACTURE (Right: Ankle)   Anesthesia type: Choice   Pre-op diagnosis: Right bimalleolar ankle fracture   Location: [facility] OR ROOM 07 / [facility]   Surgeons: Oliver Barre, MD      NOTE: Available PAT nursing documentation and vital signs have been reviewed. Clinical nursing staff has updated patient's PMH/PSHx, current medication list, and drug allergies/intolerances to ensure comprehensive history available to assist in medical decision making as it pertains to the aforementioned surgical procedure and anticipated anesthetic course. Extensive review of available clinical information personally performed. [facility] [facility] and [facility] updated with any diagnoses/procedures that  may have been inadvertently omitted during his intake with the pre-admission testing department's nursing staff.  Clinical Discussion:  Rachel Holmes is a 63 y.o. female who is submitted for pre-surgical anesthesia review and clearance prior to her undergoing the above procedure. Patient has never been a smoker in the past.  CHF, mild pulmonary hypertension, aortic stenosis, bradycardia, DVT, cardiac murmur, HTN, HLD, T2DM, hyperparathyroidism, T2DM, ESRD on dialysis, dyspnea, GERD (on daily PPI), anemia, lower extremity lymphedema, OA, chronic back pain, RIGHT bimalleolar fracture, s/p LEFT BKA, diabetic neuropathy, legally blind in LEFT eye, anxiety.   Patient is followed by cardiology Mariah Milling, MD). She was last seen in the cardiology clinic on 08/18/2023; notes reviewed. At the time of her clinic visit, patient doing well overall from a cardiovascular perspective.   Patient with chronic lower extremity lymphedema.  Patient denied any chest pain, shortness of breath, PND, orthopnea, palpitations, weakness, fatigue, vertiginous symptoms, or presyncope/syncope. Patient with a past medical history significant for cardiovascular diagnoses. Documented physical exam was grossly benign, providing no evidence of acute exacerbation and/or decompensation of the patient's known cardiovascular conditions.  Patient developed sepsis secondary to group G streptococcal bacteremia back in 12/2018.  TTE at that time revealed a normal left ventricular systolic function with an EF of 55-60%. There was a 0.75 x 1 cm mass noted on the patient's aortic valve.  In the setting of her infection, there was concern for endocarditis.  Subsequent TEE with bubble study performed on 12/28/2018 demonstrating a small shaggy mobile density on the aortic side of the RIGHT coronary cusp consistent with vegetation.  Most recent TTE performed on 09/04/2023 revealed a normal left ventricular systolic function with an EF of 50-55%. There were no regional wall motion abnormalities. Left ventricular diastolic Doppler parameters consistent with abnormal relaxation (G1DD). GLS -10.0%. Left atrium mildly enlarged. Right ventricular size and function normal with a TAPSE measuring 1.7 cm  (normal range >/= 1.6 cm). There was no significant valvular regurgitation. Aortic valve with moderate calcification. All transvalvular gradients were noted to be normal providing no evidence suggestive of valvular stenosis. Aorta normal in size with no evidence of ectasia or aneurysmal dilatation.  Blood pressure reasonably controlled at 136/60 mmHg on currently prescribed alpha-blocker (clonidine + doxazosin), nitrate (isosorbide mononitrate), ARB (olmesartan) and diuretic (torsemide) therapies. Patient is on ezetimibe for her HLD diagnosis and ASCVD prevention. T2DM reasonably controlled on currently prescribed regimen; last HgbA1c  was 7.9% when checked on 08/14/2023. She does not have an OSAH diagnosis.  Functional capacity limited by her LEFT BKA.  With that said, patient  able to complete all of her ADLs/IADLs without cardiovascular limitation.  Per the DASI, patient is able to exceed 4 METS of physical activity without experiencing any significant degree of angina/anginal equivalent symptoms.  No changes were made to her medication regimen during her visit with cardiology.  Patient scheduled to follow-up with outpatient cardiology in 6 months or sooner if needed.  Rachel Holmes is scheduled for an elective OPEN REDUCTION INTERNAL FIXATION (ORIF) ANKLE FRACTURE (Right: Ankle) on 01/22/2024 with Dr. Thane Edu, MD. Given patient's past medical history significant for cardiovascular diagnoses, presurgical cardiac clearance was sought by the PAT team. Per cardiology, "Ms. Sweeney's perioperative risk of a major cardiac event is 11% according to the Revised Cardiac Risk Index (RCRI).  Therefore, she is at high risk for perioperative complications.  Her functional capacity is fair at 5.04 METs according to the [place] Activity Status Index (DASI). According to ACC/AHA guidelines, no further cardiovascular testing needed.  The patient may proceed to surgery at ACCEPTABLE risk".    In review of the patient's chart, it is noted that she is on daily oral antithrombotic therapy. Given that patient's past medical history is significant for cardiovascular diagnoses, including but not limited to CAD, orthopedics has cleared patient to continue her daily low dose ASA throughout her perioperative course.  Patient has been updated on these directives from her specialty care providers by the PAT team.  Patient reports previous perioperative complications with anesthesia in the past. Patient has a PMH (+) for PONV. Symptoms and history of PONV will be discussed with patient by anesthesia team on the day of her procedure. Interventions will be ordered  as deemed necessary based on patient's individual care needs as determined by anesthesiologist. Additionally, patient has both a personal and familial history of (+) delayed emergence with general anesthesia.  In review her EMR, it is noted that patient underwent a general anesthetic course here at [facility] (ASA IV) in 10/2022 without documented complications.      12/03/2023   10:51 AM 12/03/2023   10:50 AM 11/12/2023   12:23 PM  Vitals with BMI  Height  5\' 8"    Weight  300 lbs 1 oz   BMI  45.63   Systolic 172  170  Diastolic 81  67  Pulse  73 59   Providers/Specialists:  NOTE: Primary physician provider listed below. Patient may have been seen by APP or partner within same practice.   PROVIDER ROLE / SPECIALTY LAST OV  Oliver Barre, MD Orthopedics (Surgeon) 01/15/2024  Rayetta Humphrey, MD Primary Care Provider 12/29/2023  Julien Nordmann, MD Cardiology 08/18/2023; preop APP call 01/21/2024  Edward Jolly, MD Pain Management 09/24/2023  Mosetta Pigeon, MD Nephrology 03/12/2022   Allergies:   Allergies  Allergen Reactions   Nifedipine Rash   Penicillins Hives, Shortness Of Breath and Swelling   Statins Shortness Of Breath    Wheezing   Hydralazine Itching   Hydrocodone Itching and Nausea Only   Ivp Dye [Iodinated Contrast Media]     ESRD   Metoprolol Diarrhea and Other (See Comments)    Pt states "make my heart race"   Oxycodone Nausea Only   Codeine Nausea Only   Ibuprofen Other (See Comments)    Raises blood pressure   Current Home Medications:   No current facility-administered medications for this encounter.    acetaminophen (TYLENOL) 500 MG tablet   albuterol (PROVENTIL HFA;VENTOLIN HFA) 108 (90 Base) MCG/ACT inhaler   aspirin  EC 81 MG EC tablet   baclofen (LIORESAL) 10 MG tablet   calcitRIOL (ROCALTROL) 0.25 MCG capsule   cetirizine (ZYRTEC) 10 MG tablet   clobetasol cream (TEMOVATE) 0.05 %   cloNIDine (CATAPRES) 0.3 MG  tablet   docusate sodium (COLACE) 100 MG capsule   doxazosin (CARDURA) 8 MG tablet   DULoxetine (CYMBALTA) 30 MG capsule   ezetimibe (ZETIA) 10 MG tablet   gabapentin (NEURONTIN) 600 MG tablet   glucosamine-chondroitin 500-400 MG tablet   hydrOXYzine (ATARAX) 25 MG tablet   insulin degludec (TRESIBA) 100 UNIT/ML FlexTouch Pen   isosorbide mononitrate (IMDUR) 120 MG 24 hr tablet   lactulose (CHRONULAC) 10 GM/15ML solution   LYUMJEV KWIKPEN 100 UNIT/ML KwikPen   Magnesium 400 MG CAPS   MOUNJARO 7.5 MG/0.5ML Pen   Multiple Vitamin (MULTIVITAMIN WITH MINERALS) TABS tablet   olmesartan (BENICAR) 40 MG tablet   omeprazole (PRILOSEC) 40 MG capsule   ondansetron (ZOFRAN-ODT) 4 MG disintegrating tablet   torsemide (DEMADEX) 100 MG tablet   traMADol (ULTRAM) 50 MG tablet   vitamin B-12 (CYANOCOBALAMIN) 1000 MCG tablet   Continuous Blood Gluc Receiver (FREESTYLE LIBRE 14 DAY READER) DEVI   Lancets (ONETOUCH ULTRASOFT) lancets   ONETOUCH VERIO test strip   History:   Past Medical History:  Diagnosis Date   Allergic rhinitis    Allergy    Anemia    Anxiety    Aortic stenosis 09/13/2016   a.) TTE 09/13/2016: mild AS --> MPG 11.2 mmHg. b.) TTE 12/27/2020: EF 55-60%; no AS --> MPG 8 mmHg.   Aortic valve endocarditis 12/25/2018   a.) TTE 12/25/2018: 0.75 x 1 cm AV mass. b.) TEE 12/28/2018: small shaggy mobile density on aortic side of RIGHT coronary cusp consistent with vegetation.   Arthritis    Back pain, chronic    Bimalleolar fracture of right ankle 01/2024   Bradycardia    Breast mass    Patient can no longer palpate specific masses but showed tech general area of concern   Charcot's joint of ankle, left    CHF (congestive heart failure) (HCC) 07/09/2007   a.) TTE 07/09/2007: EF 50%; G1DD. b.) TTE 09/13/2016: EF 55%; G2DD. c.) TTE 12/27/2020: EF 55-60%; G2DD; GLS -15.1%.; d.) TTE 09/04/2023: EF 50-55%, G1DD, mild LAE, mild MR, mod AoV calc   Complication of anesthesia    a.) PONV.  b.) Delayed emergence   Constipation    Diabetic nephropathy (HCC)    DVT (deep venous thrombosis) (HCC)    Dyspnea    ESRD (end stage renal disease) on dialysis [facility])    Family history of adverse reaction to anesthesia    a.) Sisters x 2 with (+) delayed emergence.   GERD (gastroesophageal reflux disease)    Heart murmur    HLD (hyperlipidemia)    Hyperparathyroidism (HCC)    Hypertension    Legally blind in left eye, as defined in [place]    Lymphedema    Mild pulmonary hypertension (HCC) 09/13/2016   a.) TTE 09/13/2016: EF 55%; RVSP 45 mmHg. b.) TTE 12/25/2018: EF 55-60%; RVSP 35.5 mmHg.   Obesity    Onychomycosis    PONV (postoperative nausea and vomiting)    S/P BKA (below knee amputation) unilateral, left (HCC)    Sepsis (HCC) 12/2018   a.) group G streptococcal bacteremia with AV endocarditis and LLE soft tissue infection.   T2DM (type 2 diabetes mellitus) [facility])    Past Surgical History:  Procedure Laterality Date   A/V FISTULAGRAM Left 06/18/2022  Procedure: A/V Fistulagram;  Surgeon: Renford Dills, MD;  Location: [facility] INVASIVE CV LAB;  Service: Cardiovascular;  Laterality: Left;   A/V FISTULAGRAM Left 08/13/2022   Procedure: A/V Fistulagram;  Surgeon: Renford Dills, MD;  Location: [facility] INVASIVE CV LAB;  Service: Cardiovascular;  Laterality: Left;   ABDOMINAL HYSTERECTOMY     AMPUTATION Left 05/05/2019   Procedure: AMPUTATION BELOW KNEE;  Surgeon: Renford Dills, MD;  Location: [facility] ORS;  Service: Vascular;  Laterality: Left;   AV FISTULA PLACEMENT Right 09/19/2021   Procedure: ARTERIOVENOUS (AV) FISTULA CREATION (BRACHIAL CEPHALIC);  Surgeon: Renford Dills, MD;  Location: [facility] ORS;  Service: Vascular;  Laterality: Right;   AV FISTULA PLACEMENT Left 11/09/2021   Procedure: ARTERIOVENOUS (AV) FISTULA CREATION ( BRACHIAL CEPHALIC );  Surgeon: Renford Dills, MD;  Location: [facility] ORS;  Service: Vascular;  Laterality: Left;   BREAST BIOPSY Left 2014   FNA  12:00 position - Negative   CATARACT EXTRACTION Bilateral    DIALYSIS/PERMA CATHETER INSERTION N/A 03/28/2022   Procedure: DIALYSIS/PERMA CATHETER INSERTION;  Surgeon: Annice Needy, MD;  Location: [facility] INVASIVE CV LAB;  Service: Cardiovascular;  Laterality: N/A;   DIALYSIS/PERMA CATHETER REMOVAL N/A 12/09/2022   Procedure: DIALYSIS/PERMA CATHETER REMOVAL;  Surgeon: Annice Needy, MD;  Location: [facility] INVASIVE CV LAB;  Service: Cardiovascular;  Laterality: N/A;   EYE SURGERY Left 2007   removed a lens, no lens implanted   IR FLUORO GUIDE CV LINE RIGHT  12/29/2018   IR REMOVAL TUN CV CATH W/O FL  02/19/2019   IR US GUIDE VASC ACCESS RIGHT  12/29/2018   REVISON OF ARTERIOVENOUS FISTULA Left 10/16/2022   Procedure: REVISON OF ARTERIOVENOUS FISTULA;  Surgeon: Renford Dills, MD;  Location: [facility] ORS;  Service: Vascular;  Laterality: Left;   TEE WITHOUT CARDIOVERSION N/A 12/28/2018   Procedure: TRANSESOPHAGEAL ECHOCARDIOGRAM (TEE);  Surgeon: Quintella Reichert, MD;  Location[facility] ENDOSCOPY;  Service: Cardiovascular;  Laterality: N/A;   Family History  Problem Relation Age of Onset   Breast cancer Sister 48   Diabetes Sister    Diabetes Mother    Hypertension Mother    Hyperlipidemia Mother    Eating disorder Mother    Obesity Mother    Social History   Tobacco Use   Smoking status: Never   Smokeless tobacco: Never  Substance Use Topics   Alcohol use: No   Pertinent Clinical Results:  LABS:  Lab Results  Component Value Date   WBC 7.2 01/20/2024   HGB 10.3 (L) 01/20/2024   HCT 32.7 (L) 01/20/2024   MCV 94.2 01/20/2024   PLT 229 01/20/2024   Lab Results  Component Value Date   NA 138 01/20/2024   CL 102 01/20/2024   K 4.1 01/20/2024   CO2 24 01/20/2024   BUN 38 (H) 01/20/2024   CREATININE 4.38 (H) 01/20/2024   GFRNONAA 11 (L) 01/20/2024   CALCIUM 8.6 (L) 01/20/2024   PHOS 4.5 09/09/2014   ALBUMIN 3.6 02/16/2023   GLUCOSE 96 01/20/2024    ECG: Date: 08/18/2023  Time  ECG obtained: 1500 PM Rate: 72 bpm Rhythm: Sinus rhythm with marked sinus arrhythmia Axis (leads I and aVF): normal Intervals: PR 158 ms. QRS 90 ms. QTc 438 ms. ST segment and T wave changes: Nonspecific lateral T wave abnormality  Evidence of a possible, age undetermined, prior infarct:  No Comparison: Similar to previous tracing obtained on 11/27/2022   IMAGING / PROCEDURES: DG ANKLE COMPLETE RIGHT performed on 01/14/2024 Progressive  worsening of the distal fibula fracture, with angulation, as well as shortening.  There is a fracture of the medial malleolus, without intra-articular involvement, that is progressively worsening.   TRANSTHORACIC ECHOCARDIOGRAM performed on 09/04/2023 Left ventricular ejection fraction, by estimation, is 50 to 55%. The left ventricle has low normal function. The left ventricle has no regional wall motion abnormalities. Left ventricular diastolic parameters are consistent with Grade I diastolic dysfunction (impaired relaxation). The average left ventricular global longitudinal strain is -10.0 %.  Right ventricular systolic function is normal. The right ventricular size is normal. Tricuspid regurgitation signal is inadequate for assessing PA pressure.  Left atrial size was mildly dilated.  The mitral valve is normal in structure. Mild mitral valve regurgitation. No evidence of mitral stenosis.  The aortic valve has an indeterminant number of cusps. There is moderate calcification of the aortic valve. Aortic valve regurgitation is not visualized. Aortic valve sclerosis/calcification is present, without any evidence of aortic stenosis.  The inferior vena cava is normal in size with greater than 50% respiratory variability, suggesting right atrial pressure of 3 mmHg.   TRANSESOPHAGEAL ECHOCARDIOGRAM performed on 12/28/2018 The left ventricle has normal systolic function, with an ejection fraction of 55-60%. The cavity size was normal.  The right ventricle has  normal systolic function. The cavity was normal. There is no increase in right ventricular wall thickness.  Normal LA and LA appendage with no evidence of thrombus.  The tricuspid valve was normal in structure.  Aortic valve regurgitation is trivial by color flow Doppler. Aortic valve vegetation is present.  There is a small shaggy mobile density on the aortic side of the right coronary cusp consistent with vegetation. It is only appreciated in the 110 degree angle on images #[id number].  The pulmonic valve was normal in structure.  Interatrial Septum: No atrial level shunt detected by color flow Doppler. Agitated saline contrast was given intravenously to evaluate for  intracardiac shunting. Saline contrast bubble study was negative, with no evidence of any interatrial shunt.   Impression and Plan:  Rachel Holmes has been referred for pre-anesthesia review and clearance prior to her undergoing the planned anesthetic and procedural courses. Available labs, pertinent testing, and imaging results were personally reviewed by me in preparation for upcoming operative/procedural course. [facility] medical record has been updated following extensive record review and patient interview with PAT staff.   This patient has been appropriately cleared by cardiology with an overall HIGH/ACCEPTABLE risk of experiencing significant perioperative cardiovascular complications. Based on clinical review performed today (01/21/24), barring any significant acute changes in the patient's overall condition, it is anticipated that she will be able to proceed with the planned surgical intervention. Any acute changes in clinical condition may necessitate her procedure being postponed and/or cancelled. Patient will meet with anesthesia team (MD and/or CRNA) on the day of her procedure for preoperative evaluation/assessment. Questions regarding anesthetic course will be fielded at that time.   Pre-surgical instructions were  reviewed with the patient during his PAT appointment, and questions were fielded to satisfaction by PAT clinical staff. She has been instructed on which medications that she will need to hold prior to surgery, as well as the ones that have been deemed safe/appropriate to take on the day of his procedure. As part of the general education provided by PAT, patient made aware both verbally and in writing, that she would need to abstain from the use of any illegal substances during his perioperative course. She was advised that failure to  follow the provided instructions could necessitate case cancellation or result in serious perioperative complications up to and including death. Patient encouraged to contact PAT and/or her surgeon's office to discuss any questions or concerns that may arise prior to surgery; verbalized understanding.   Quentin Mulling, MSN, APRN, FNP-C, CEN [facility]  Perioperative Services Nurse Practitioner Phone: [phone] Fax: [phone] 01/21/24 11:36 AM  NOTE: This note has been prepared using Dragon dictation software. Despite my best ability to proofread, there is always the potential that unintentional transcriptional errors may still occur from this process.

## 2024-01-21 ENCOUNTER — Telehealth: Payer: Self-pay | Admitting: *Deleted

## 2024-01-21 ENCOUNTER — Encounter: Payer: Self-pay | Admitting: Orthopedic Surgery

## 2024-01-21 ENCOUNTER — Ambulatory Visit: Attending: Cardiology

## 2024-01-21 DIAGNOSIS — Z01810 Encounter for preprocedural cardiovascular examination: Secondary | ICD-10-CM

## 2024-01-21 NOTE — Telephone Encounter (Signed)
 Per preop APP pt has been added on for tele preop appt today at 11 am. Med rec and consent are done.      Patient Consent for Virtual Visit        Rachel Holmes has provided verbal consent on 01/21/2024 for a virtual visit (video or telephone).   CONSENT FOR VIRTUAL VISIT FOR:  Rachel Holmes  By participating in this virtual visit I agree to the following:  I hereby voluntarily request, consent and authorize [facility] and its employed or contracted physicians, physician assistants, nurse practitioners or other licensed health care professionals (the Practitioner), to provide me with telemedicine health care services (the "Services") as deemed necessary by the treating Practitioner. I acknowledge and consent to receive the Services by the Practitioner via telemedicine. I understand that the telemedicine visit will involve communicating with the Practitioner through live audiovisual communication technology and the disclosure of certain medical information by electronic transmission. I acknowledge that I have been given the opportunity to request an in-person assessment or other available alternative prior to the telemedicine visit and am voluntarily participating in the telemedicine visit.  I understand that I have the right to withhold or withdraw my consent to the use of telemedicine in the course of my care at any time, without affecting my right to future care or treatment, and that the Practitioner or I may terminate the telemedicine visit at any time. I understand that I have the right to inspect all information obtained and/or recorded in the course of the telemedicine visit and may receive copies of available information for a reasonable fee.  I understand that some of the potential risks of receiving the Services via telemedicine include:  Delay or interruption in medical evaluation due to technological equipment failure or disruption; Information transmitted may not be  sufficient (e.g. poor resolution of images) to allow for appropriate medical decision making by the Practitioner; and/or  In rare instances, security protocols could fail, causing a breach of personal health information.  Furthermore, I acknowledge that it is my responsibility to provide information about my medical history, conditions and care that is complete and accurate to the best of my ability. I acknowledge that Practitioner's advice, recommendations, and/or decision may be based on factors not within their control, such as incomplete or inaccurate data provided by me or distortions of diagnostic images or specimens that may result from electronic transmissions. I understand that the practice of medicine is not an exact science and that Practitioner makes no warranties or guarantees regarding treatment outcomes. I acknowledge that a copy of this consent can be made available to me via my patient portal [facility] MyChart), or I can request a printed copy by calling the office of [facility].    I understand that my insurance will be billed for this visit.   I have read or had this consent read to me. I understand the contents of this consent, which adequately explains the benefits and risks of the Services being provided via telemedicine.  I have been provided ample opportunity to ask questions regarding this consent and the Services and have had my questions answered to my satisfaction. I give my informed consent for the services to be provided through the use of telemedicine in my medical care

## 2024-01-21 NOTE — Telephone Encounter (Signed)
   Pre-operative Risk Assessment    Patient Name: Rachel Holmes  DOB: 1961-01-20 MRN: [id number]   Date of last office visit: 08/18/23 DR. GOLLAN Date of next office visit: NONE   Request for Surgical Clearance    Procedure:  OPEN REDUCTION INTERNAL FIXATION (ORIF) ANKLE FRACTURE   Date of Surgery:  Clearance 01/22/24                                Surgeon:  DR. MARK CAIRNS Surgeon's Group or Practice Name:  [facility] Phone number:  [phone] Fax number:  [phone]   Type of Clearance Requested:   - Medical ; PER CLEARANCE FORM NONE TO BE HELD; OK PER SURGEON TO REMAIN ON ASA   Type of Anesthesia:  General    Additional requests/questions:    Elpidio Anis   01/21/2024, 9:38 AM

## 2024-01-21 NOTE — Telephone Encounter (Signed)
-----   Message from Verlee Monte sent at 01/16/2024  4:19 PM EDT ----- Regarding: Request for pre-operative cardiac clearance Request for pre-operative cardiac clearance:  1. What type of surgery is being performed?  OPEN REDUCTION INTERNAL FIXATION (ORIF) ANKLE FRACTURE  2. When is this surgery scheduled?  01/22/2024  3. Type of clearance being requested (medical, pharmacy, both)? MEDICAL   4. Are there any medications that need to be held prior to surgery? N/A - surgeon has cleared patient to continue their daily LOW DOSE ASPIRIN throughout the perioperative course. Dose will be held on the day of surgery only.   5. Practice name and name of physician performing surgery?  Performing surgeon: Dr. Thane Edu, MD Requesting clearance: Quentin Mulling, FNP-C    6. Anesthesia type (none, local, MAC, general)? GENERAL  7. What is the office phone and fax number?   Fax: [phone]  ATTENTION: Unable to create telephone message as per your standard workflow. Directed by [facility] providers to send requests for cardiac clearance to this pool for appropriate distribution to provider covering pre-operative clearances.   Quentin Mulling, MSN, APRN, FNP-C, CEN [facility]  Peri-operative Services Nurse Practitioner Phone: [phone] 01/16/24 4:19 PM

## 2024-01-21 NOTE — Progress Notes (Signed)
 Virtual Visit via Telephone Note   Because of Rachel Holmes co-morbid illnesses, she is at least at moderate risk for complications without adequate follow up.  This format is felt to be most appropriate for this patient at this time.  Due to technical limitations with video connection (technology), today's appointment will be conducted as an audio only telehealth visit, and TACCARA BUSHNELL verbally agreed to proceed in this manner.   All issues noted in this document were discussed and addressed.  No physical exam could be performed with this format.  Evaluation Performed:  Preoperative cardiovascular risk assessment _____________   Date:  01/21/2024   Patient ID:  Rachel Holmes, DOB [birth year], MRN [id number] Patient Location:  Home Provider location:   Office  Primary Care Provider:  Rayetta Humphrey, MD Primary Cardiologist:  Julien Nordmann, MD  Chief Complaint / Patient Profile  63 y.o. y/o female with a h/o hypertension, anemia of chronic disease, CHF, GERD, bradycardia, hyperparathyroidism, ESRD on HD, left AKA, group G streptococcal bacteremia with aortic valve endocarditis in 2020 who is pending open reduction internal fixation ankle fracture with Dr. Dallas Schimke and presents today for telephonic preoperative cardiovascular risk assessment. History of Present Illness  Rachel Holmes is a 63 y.o. female who presents via audio/video conferencing for a telehealth visit today.  Pt was last seen in cardiology clinic on 08/18/2023 by Dr. Mariah Milling.  At that time VALLORY OETKEN was doing well.  Echocardiogram on 09/04/2023 indicated LVEF of 50 to 55%, no RWMA, grade 1 diastolic dysfunction. The patient is now pending procedure as outlined above. Since her last visit, she has remained stable from a cardiac standpoint.  Patient reports that she has been feeling very well, she denies any chest pain or shortness of breath.  Prior to her further injuring her ankle earlier this month she was  able to achieve greater than 4 METS of activity, consisting of activity at her house, walking indoors, vacuuming, scrubbing the floors, she denied any anginal symptoms with these activities.  Today she denies chest pain, shortness of breath, lower extremity edema, fatigue, palpitations, melena, hematuria, hemoptysis, diaphoresis, weakness, presyncope, syncope, orthopnea, and PND.  Past Medical History    Past Medical History:  Diagnosis Date   Allergic rhinitis    Allergy    Anemia    Anxiety    Aortic stenosis 09/13/2016   a.) TTE 09/13/2016: mild AS --> MPG 11.2 mmHg. b.) TTE 12/27/2020: EF 55-60%; no AS --> MPG 8 mmHg.   Aortic valve endocarditis 12/25/2018   a.) TTE 12/25/2018: 0.75 x 1 cm AV mass. b.) TEE 12/28/2018: small shaggy mobile density on aortic side of RIGHT coronary cusp consistent with vegetation.   Arthritis    Back pain, chronic    Bimalleolar fracture of right ankle 01/2024   Bradycardia    Breast mass    Patient can no longer palpate specific masses but showed tech general area of concern   Charcot's joint of ankle, left    CHF (congestive heart failure) (HCC) 07/09/2007   a.) TTE 07/09/2007: EF 50%; G1DD. b.) TTE 09/13/2016: EF 55%; G2DD. c.) TTE 12/27/2020: EF 55-60%; G2DD; GLS -15.1%.; d.) TTE 09/04/2023: EF 50-55%, G1DD, mild LAE, mild MR, mod AoV calc   Complication of anesthesia    a.) PONV. b.) Delayed emergence   Constipation    Diabetic nephropathy (HCC)    DVT (deep venous thrombosis) (HCC)    Dyspnea    ESRD (end stage renal  disease) on dialysis [facility])    Family history of adverse reaction to anesthesia    a.) Sisters x 2 with (+) delayed emergence.   GERD (gastroesophageal reflux disease)    Heart murmur    HLD (hyperlipidemia)    Hyperparathyroidism (HCC)    Hypertension    Legally blind in left eye, as defined in [place]    Lymphedema    Mild pulmonary hypertension (HCC) 09/13/2016   a.) TTE 09/13/2016: EF 55%; RVSP 45 mmHg. b.) TTE 12/25/2018:  EF 55-60%; RVSP 35.5 mmHg.   Obesity    Onychomycosis    PONV (postoperative nausea and vomiting)    S/P BKA (below knee amputation) unilateral, left (HCC)    Sepsis (HCC) 12/2018   a.) group G streptococcal bacteremia with AV endocarditis and LLE soft tissue infection.   T2DM (type 2 diabetes mellitus) [facility])    Past Surgical History:  Procedure Laterality Date   A/V FISTULAGRAM Left 06/18/2022   Procedure: A/V Fistulagram;  Surgeon: Renford Dills, MD;  Location: [facility] INVASIVE CV LAB;  Service: Cardiovascular;  Laterality: Left;   A/V FISTULAGRAM Left 08/13/2022   Procedure: A/V Fistulagram;  Surgeon: Renford Dills, MD;  Location: [facility] INVASIVE CV LAB;  Service: Cardiovascular;  Laterality: Left;   ABDOMINAL HYSTERECTOMY     AMPUTATION Left 05/05/2019   Procedure: AMPUTATION BELOW KNEE;  Surgeon: Renford Dills, MD;  Location: [facility] ORS;  Service: Vascular;  Laterality: Left;   AV FISTULA PLACEMENT Right 09/19/2021   Procedure: ARTERIOVENOUS (AV) FISTULA CREATION (BRACHIAL CEPHALIC);  Surgeon: Renford Dills, MD;  Location: [facility] ORS;  Service: Vascular;  Laterality: Right;   AV FISTULA PLACEMENT Left 11/09/2021   Procedure: ARTERIOVENOUS (AV) FISTULA CREATION ( BRACHIAL CEPHALIC );  Surgeon: Renford Dills, MD;  Location: [facility] ORS;  Service: Vascular;  Laterality: Left;   BREAST BIOPSY Left 2014   FNA 12:00 position - Negative   CATARACT EXTRACTION Bilateral    DIALYSIS/PERMA CATHETER INSERTION N/A 03/28/2022   Procedure: DIALYSIS/PERMA CATHETER INSERTION;  Surgeon: Annice Needy, MD;  Location: [facility] INVASIVE CV LAB;  Service: Cardiovascular;  Laterality: N/A;   DIALYSIS/PERMA CATHETER REMOVAL N/A 12/09/2022   Procedure: DIALYSIS/PERMA CATHETER REMOVAL;  Surgeon: Annice Needy, MD;  Location: [facility] INVASIVE CV LAB;  Service: Cardiovascular;  Laterality: N/A;   EYE SURGERY Left 2007   removed a lens, no lens implanted   IR FLUORO GUIDE CV LINE RIGHT  12/29/2018   IR  REMOVAL TUN CV CATH W/O FL  02/19/2019   IR US GUIDE VASC ACCESS RIGHT  12/29/2018   REVISON OF ARTERIOVENOUS FISTULA Left 10/16/2022   Procedure: REVISON OF ARTERIOVENOUS FISTULA;  Surgeon: Renford Dills, MD;  Location: [facility] ORS;  Service: Vascular;  Laterality: Left;   TEE WITHOUT CARDIOVERSION N/A 12/28/2018   Procedure: TRANSESOPHAGEAL ECHOCARDIOGRAM (TEE);  Surgeon: Quintella Reichert, MD;  Location: [facility] ENDOSCOPY;  Service: Cardiovascular;  Laterality: N/A;   Allergies Allergies  Allergen Reactions   Nifedipine Rash   Penicillins Hives, Shortness Of Breath and Swelling   Statins Shortness Of Breath    Wheezing   Hydralazine Itching   Hydrocodone Itching and Nausea Only   Ivp Dye [Iodinated Contrast Media]     ESRD   Metoprolol Diarrhea and Other (See Comments)    Pt states "make my heart race"   Oxycodone Nausea Only   Codeine Nausea Only   Ibuprofen Other (See Comments)    Raises blood pressure   Home Medications  Prior to Admission medications   Medication Sig Start Date End Date Taking? Authorizing Provider  acetaminophen (TYLENOL) 500 MG tablet Take 1,000 mg by mouth every 6 (six) hours as needed for moderate pain (pain score 4-6).    [provider]  albuterol (PROVENTIL HFA;VENTOLIN HFA) 108 (90 Base) MCG/ACT inhaler Inhale 1-2 puffs into the lungs every 6 (six) hours as needed for wheezing or shortness of breath.    [provider]  aspirin EC 81 MG EC tablet Take 1 tablet (81 mg total) by mouth daily. 05/11/19   Stegmayer, Ranae Plumber, PA-C  baclofen (LIORESAL) 10 MG tablet Take 10 mg by mouth 3 (three) times daily as needed for muscle spasms.    [provider]  calcitRIOL (ROCALTROL) 0.25 MCG capsule Take 0.25 mcg by mouth every Monday, Wednesday, and Friday with hemodialysis. 10/23/21   [provider]  cetirizine (ZYRTEC) 10 MG tablet Take 10 mg by mouth at bedtime.    [provider]  clobetasol cream (TEMOVATE) 0.05 %  Apply 1 application  topically 2 (two) times daily as needed (rash).    [provider]  cloNIDine (CATAPRES) 0.3 MG tablet Take 1 tablet (0.3 mg total) by mouth 2 (two) times daily. 06/18/21   Antonieta Iba, MD  Continuous Blood Gluc Receiver (FREESTYLE LIBRE 14 DAY READER) DEVI USE AS DIRECTED 07/13/20   Romero Belling, MD  docusate sodium (COLACE) 100 MG capsule Take 1 capsule (100 mg total) by mouth 2 (two) times daily. 05/10/19   Stegmayer, Cala Bradford A, PA-C  doxazosin (CARDURA) 8 MG tablet Take 8 mg by mouth daily.    [provider]  DULoxetine (CYMBALTA) 30 MG capsule Take 30 mg by mouth at bedtime. 08/28/18   [provider]  ezetimibe (ZETIA) 10 MG tablet Take 1 tablet (10 mg total) by mouth daily. 06/18/21   Antonieta Iba, MD  gabapentin (NEURONTIN) 600 MG tablet Take 600 mg by mouth at bedtime.    [provider]  glucosamine-chondroitin 500-400 MG tablet Take 1 tablet by mouth in the morning and at bedtime.    [provider]  hydrOXYzine (ATARAX) 25 MG tablet Take 25 mg by mouth every 6 (six) hours as needed for itching.    [provider]  insulin degludec (TRESIBA) 100 UNIT/ML FlexTouch Pen Inject 14 Units into the skin at bedtime. 12/01/23   [provider]  isosorbide mononitrate (IMDUR) 120 MG 24 hr tablet Take 1 tablet (120 mg total) by mouth daily. Patient taking differently: Take 120 mg by mouth in the morning and at bedtime. 06/18/21   Antonieta Iba, MD  lactulose (CHRONULAC) 10 GM/15ML solution Take 15 mLs by mouth daily as needed for moderate constipation. 06/14/22   [provider]  Lancets (ONETOUCH ULTRASOFT) lancets Used to check blood sugars four times daily. 03/26/18   Romero Belling, MD  LYUMJEV KWIKPEN 100 UNIT/ML KwikPen Inject 10-20 Units into the skin in the morning and at bedtime. With meals 12/01/23   [provider]  Magnesium 400 MG CAPS Take 400 mg by mouth at bedtime.    [provider]  MOUNJARO 7.5 MG/0.5ML Pen Inject 7.5 mg into the skin once a week. 12/29/23   [provider]  Multiple Vitamin (MULTIVITAMIN WITH MINERALS) TABS tablet Take 1 tablet by mouth daily.    [provider]  olmesartan (BENICAR) 40 MG tablet Take 40 mg by mouth daily.    [provider]  omeprazole (PRILOSEC)  40 MG capsule Take 40 mg by mouth 2 (two) times daily with breakfast and lunch.    [provider]  ondansetron (ZOFRAN-ODT) 4 MG disintegrating tablet Take 1 tablet (4 mg total) by mouth every 8 (eight) hours as needed for nausea or vomiting. 02/16/23   Minna Antis, MD  [facility] VERIO test strip USE TO CHECK BLOOD SUGAR TWICE DAILY 01/21/22   Romero Belling, MD  torsemide (DEMADEX) 100 MG tablet Take 1 tablet (100 mg total) by mouth 2 (two) times daily. 08/18/23   Antonieta Iba, MD  traMADol (ULTRAM) 50 MG tablet Take 1 tablet (50 mg total) by mouth every 12 (twelve) hours as needed. 12/18/23   Oliver Barre, MD  vitamin B-12 (CYANOCOBALAMIN) 1000 MCG tablet Take 1,000 mcg by mouth daily.    [provider]   Physical Exam    Vital Signs:  DELANEY PERONA does not have vital signs available for review today. Given telephonic nature of communication, physical exam is limited. AAOx3. NAD. Normal affect.  Speech and respirations are unlabored.  Accessory Clinical Findings   None Assessment & Plan    1.  Preoperative Cardiovascular Risk Assessment: Open reduction internal fixation ankle fracture with Dr. Dallas Schimke  Ms. Yielding's perioperative risk of a major cardiac event is 11% according to the Revised Cardiac Risk Index (RCRI). Therefore, she is at high risk for perioperative complications. Her functional capacity is fair at 5.04 METs according to the Duke Activity Status Index (DASI). Recommendations: According to ACC/AHA guidelines, no further cardiovascular testing needed.  The patient may proceed to surgery at acceptable  risk.   Antiplatelet and/or Anticoagulation Recommendations: Per clearance request patient will be remaining on aspirin through the perioperative period.   The patient was advised that if she develops new symptoms prior to surgery to contact our office to arrange for a follow-up visit, and she verbalized understanding.  A copy of this note will be routed to requesting surgeon.  Time:   Today, I have spent 10 minutes with the patient with telehealth technology discussing medical history, symptoms, and management plan.    Rip Harbour, NP  01/21/2024, 11:40 AM

## 2024-01-21 NOTE — Telephone Encounter (Signed)
 Per preop APP pt has been added on for tele preop appt today at 11 am. Med rec and consent are done.

## 2024-01-21 NOTE — Telephone Encounter (Signed)
   Name: Rachel Holmes  DOB: [birth year]  MRN: [id number]  Primary Cardiologist: Julien Nordmann, MD   Preoperative team, please contact this patient and set up a phone call appointment for further preoperative risk assessment. Please obtain consent and complete medication review. Thank you for your help.  I confirm that guidance regarding antiplatelet and oral anticoagulation therapy has been completed and, if necessary, noted below.  None requested.  I also confirmed the patient resides in the state of [place]. As per [facility] Medical Board telemedicine laws, the patient must reside in the state in which the provider is licensed.   Ronney Asters, NP 01/21/2024, 9:56 AM [facility]

## 2024-01-22 ENCOUNTER — Other Ambulatory Visit: Payer: Self-pay

## 2024-01-22 ENCOUNTER — Ambulatory Visit: Payer: Self-pay | Admitting: Urgent Care

## 2024-01-22 ENCOUNTER — Ambulatory Visit

## 2024-01-22 ENCOUNTER — Encounter: Payer: Self-pay | Admitting: Orthopedic Surgery

## 2024-01-22 ENCOUNTER — Observation Stay
Admission: RE | Admit: 2024-01-22 | Discharge: 2024-01-27 | Disposition: A | Discharge status: Skilled Nursing Facility | Attending: Internal Medicine | Admitting: Internal Medicine

## 2024-01-22 ENCOUNTER — Encounter
Admission: RE | Disposition: A | Discharge status: Skilled Nursing Facility | Payer: Self-pay | Source: Home / Self Care | Attending: Internal Medicine

## 2024-01-22 DIAGNOSIS — S82841P Displaced bimalleolar fracture of right lower leg, subsequent encounter for closed fracture with malunion: Principal | ICD-10-CM | POA: Insufficient documentation

## 2024-01-22 DIAGNOSIS — Z79899 Other long term (current) drug therapy: Secondary | ICD-10-CM | POA: Diagnosis not present

## 2024-01-22 DIAGNOSIS — E119 Type 2 diabetes mellitus without complications: Secondary | ICD-10-CM

## 2024-01-22 DIAGNOSIS — I132 Hypertensive heart and chronic kidney disease with heart failure and with stage 5 chronic kidney disease, or end stage renal disease: Secondary | ICD-10-CM | POA: Insufficient documentation

## 2024-01-22 DIAGNOSIS — W19XXXA Unspecified fall, initial encounter: Secondary | ICD-10-CM | POA: Diagnosis not present

## 2024-01-22 DIAGNOSIS — Z992 Dependence on renal dialysis: Secondary | ICD-10-CM

## 2024-01-22 DIAGNOSIS — N186 End stage renal disease: Secondary | ICD-10-CM | POA: Diagnosis not present

## 2024-01-22 DIAGNOSIS — I509 Heart failure, unspecified: Secondary | ICD-10-CM | POA: Insufficient documentation

## 2024-01-22 DIAGNOSIS — Z794 Long term (current) use of insulin: Secondary | ICD-10-CM

## 2024-01-22 DIAGNOSIS — Z7982 Long term (current) use of aspirin: Secondary | ICD-10-CM | POA: Diagnosis not present

## 2024-01-22 DIAGNOSIS — E1151 Type 2 diabetes mellitus with diabetic peripheral angiopathy without gangrene: Secondary | ICD-10-CM

## 2024-01-22 DIAGNOSIS — S82891P Other fracture of right lower leg, subsequent encounter for closed fracture with malunion: Secondary | ICD-10-CM | POA: Diagnosis not present

## 2024-01-22 DIAGNOSIS — E785 Hyperlipidemia, unspecified: Secondary | ICD-10-CM | POA: Insufficient documentation

## 2024-01-22 DIAGNOSIS — S82891A Other fracture of right lower leg, initial encounter for closed fracture: Secondary | ICD-10-CM

## 2024-01-22 DIAGNOSIS — E1122 Type 2 diabetes mellitus with diabetic chronic kidney disease: Secondary | ICD-10-CM | POA: Insufficient documentation

## 2024-01-22 DIAGNOSIS — Z86718 Personal history of other venous thrombosis and embolism: Secondary | ICD-10-CM | POA: Diagnosis not present

## 2024-01-22 DIAGNOSIS — Z89512 Acquired absence of left leg below knee: Secondary | ICD-10-CM | POA: Insufficient documentation

## 2024-01-22 DIAGNOSIS — I5032 Chronic diastolic (congestive) heart failure: Secondary | ICD-10-CM | POA: Diagnosis not present

## 2024-01-22 DIAGNOSIS — M14672 Charcot's joint, left ankle and foot: Principal | ICD-10-CM

## 2024-01-22 DIAGNOSIS — S82899A Other fracture of unspecified lower leg, initial encounter for closed fracture: Secondary | ICD-10-CM | POA: Diagnosis present

## 2024-01-22 HISTORY — DX: Dependence on renal dialysis: Z99.2

## 2024-01-22 HISTORY — DX: Other chronic pain: G89.29

## 2024-01-22 HISTORY — DX: End stage renal disease: N18.6

## 2024-01-22 HISTORY — PX: ORIF ANKLE FRACTURE: SHX5408

## 2024-01-22 LAB — POCT I-STAT, CHEM 8
BUN: 33 mg/dL — ABNORMAL HIGH (ref 8–23)
Calcium, Ion: 1.05 mmol/L — ABNORMAL LOW (ref 1.15–1.40)
Chloride: 99 mmol/L (ref 98–111)
Creatinine, Ser: 5 mg/dL — ABNORMAL HIGH (ref 0.44–1.00)
Glucose, Bld: 228 mg/dL — ABNORMAL HIGH (ref 70–99)
HCT: 32 % — ABNORMAL LOW (ref 36.0–46.0)
Hemoglobin: 10.9 g/dL — ABNORMAL LOW (ref 12.0–15.0)
Potassium: 4 mmol/L (ref 3.5–5.1)
Sodium: 136 mmol/L (ref 135–145)
TCO2: 25 mmol/L (ref 22–32)

## 2024-01-22 LAB — CBC
HCT: 32.9 % — ABNORMAL LOW (ref 36.0–46.0)
Hemoglobin: 10.6 g/dL — ABNORMAL LOW (ref 12.0–15.0)
MCH: 29.8 pg (ref 26.0–34.0)
MCHC: 32.2 g/dL (ref 30.0–36.0)
MCV: 92.4 fL (ref 80.0–100.0)
Platelets: 207 10*3/uL (ref 150–400)
RBC: 3.56 MIL/uL — ABNORMAL LOW (ref 3.87–5.11)
RDW: 14.5 % (ref 11.5–15.5)
WBC: 5.2 10*3/uL (ref 4.0–10.5)
nRBC: 0 % (ref 0.0–0.2)

## 2024-01-22 LAB — HEMOGLOBIN A1C
Hgb A1c MFr Bld: 7.3 % — ABNORMAL HIGH (ref 4.8–5.6)
Mean Plasma Glucose: 162.81 mg/dL

## 2024-01-22 LAB — GLUCOSE, CAPILLARY
Glucose-Capillary: 157 mg/dL — ABNORMAL HIGH (ref 70–99)
Glucose-Capillary: 166 mg/dL — ABNORMAL HIGH (ref 70–99)
Glucose-Capillary: 196 mg/dL — ABNORMAL HIGH (ref 70–99)

## 2024-01-22 LAB — HEPATITIS B SURFACE ANTIGEN: Hepatitis B Surface Ag: NONREACTIVE

## 2024-01-22 SURGERY — OPEN REDUCTION INTERNAL FIXATION (ORIF) ANKLE FRACTURE
Anesthesia: General | Site: Ankle | Laterality: Right

## 2024-01-22 MED ORDER — ASPIRIN 81 MG PO TBEC: 81.0000 mg | DELAYED_RELEASE_TABLET | Freq: Two times a day (BID) | ORAL | 0 refills | Status: DC

## 2024-01-22 MED ORDER — CHLORHEXIDINE GLUCONATE 0.12 % MT SOLN
OROMUCOSAL | Status: AC
  Filled 2024-01-22: qty 15

## 2024-01-22 MED ORDER — SEVOFLURANE IN SOLN
RESPIRATORY_TRACT | Status: AC
  Filled 2024-01-22: qty 250

## 2024-01-22 MED ORDER — ISOSORBIDE MONONITRATE ER 30 MG PO TB24
120.0000 mg | ORAL_TABLET | Freq: Two times a day (BID) | ORAL | Status: DC
  Administered 2024-01-22 – 2024-01-27 (×10): 120 mg via ORAL
  Filled 2024-01-22 (×10): qty 4

## 2024-01-22 MED ORDER — OXYCODONE HCL 5 MG PO TABS: 5.0000 mg | ORAL_TABLET | ORAL | 0 refills | Status: DC | PRN

## 2024-01-22 MED ORDER — BUPIVACAINE-EPINEPHRINE (PF) 0.5% -1:200000 IJ SOLN
INTRAMUSCULAR | Status: DC | PRN
  Administered 2024-01-22: 30 mL

## 2024-01-22 MED ORDER — ACETAMINOPHEN 325 MG PO TABS
650.0000 mg | ORAL_TABLET | Freq: Four times a day (QID) | ORAL | Status: DC | PRN
  Administered 2024-01-22 – 2024-01-25 (×5): 650 mg via ORAL
  Filled 2024-01-22 (×5): qty 2

## 2024-01-22 MED ORDER — PROPOFOL 1000 MG/100ML IV EMUL
INTRAVENOUS | Status: AC
  Filled 2024-01-22: qty 100

## 2024-01-22 MED ORDER — MIDAZOLAM HCL 2 MG/2ML IJ SOLN
INTRAMUSCULAR | Status: DC | PRN
  Administered 2024-01-22: 1 mg via INTRAVENOUS

## 2024-01-22 MED ORDER — ONDANSETRON HCL 4 MG/2ML IJ SOLN
INTRAMUSCULAR | Status: DC | PRN
Start: 2024-01-22 — End: 2024-01-22
  Administered 2024-01-22: 4 mg via INTRAVENOUS

## 2024-01-22 MED ORDER — GABAPENTIN 300 MG PO CAPS
ORAL_CAPSULE | ORAL | Status: AC
  Filled 2024-01-22: qty 2

## 2024-01-22 MED ORDER — LIDOCAINE HCL (PF) 2 % IJ SOLN
INTRAMUSCULAR | Status: AC
  Filled 2024-01-22: qty 5

## 2024-01-22 MED ORDER — EZETIMIBE 10 MG PO TABS
ORAL_TABLET | ORAL | Status: AC
  Filled 2024-01-22: qty 1

## 2024-01-22 MED ORDER — DEXMEDETOMIDINE HCL IN NACL 80 MCG/20ML IV SOLN
INTRAVENOUS | Status: DC | PRN
  Administered 2024-01-22 (×3): 4 ug via INTRAVENOUS

## 2024-01-22 MED ORDER — INSULIN ASPART 100 UNIT/ML IJ SOLN
0.0000 [IU] | Freq: Every day | INTRAMUSCULAR | Status: DC
  Administered 2024-01-23: 3 [IU] via SUBCUTANEOUS
  Administered 2024-01-24 – 2024-01-26 (×2): 2 [IU] via SUBCUTANEOUS
  Filled 2024-01-22 (×2): qty 1

## 2024-01-22 MED ORDER — ACETAMINOPHEN 10 MG/ML IV SOLN
INTRAVENOUS | Status: AC
  Filled 2024-01-22: qty 100

## 2024-01-22 MED ORDER — ONDANSETRON HCL 4 MG/2ML IJ SOLN
INTRAMUSCULAR | Status: AC
  Filled 2024-01-22: qty 2

## 2024-01-22 MED ORDER — INSULIN GLARGINE-YFGN 100 UNIT/ML ~~LOC~~ SOLN
11.0000 [IU] | Freq: Every day | SUBCUTANEOUS | Status: DC
  Administered 2024-01-22 – 2024-01-23 (×2): 11 [IU] via SUBCUTANEOUS
  Filled 2024-01-22 (×2): qty 0.11

## 2024-01-22 MED ORDER — ACETAMINOPHEN 500 MG PO TABS: 1000.0000 mg | ORAL_TABLET | Freq: Four times a day (QID) | ORAL | 2 refills | Status: AC | PRN

## 2024-01-22 MED ORDER — SUGAMMADEX SODIUM 200 MG/2ML IV SOLN
INTRAVENOUS | Status: DC | PRN
  Administered 2024-01-22: 50 mg via INTRAVENOUS

## 2024-01-22 MED ORDER — LIDOCAINE HCL (CARDIAC) PF 100 MG/5ML IV SOSY
PREFILLED_SYRINGE | INTRAVENOUS | Status: DC | PRN
  Administered 2024-01-22: 80 mg via INTRAVENOUS

## 2024-01-22 MED ORDER — LACTATED RINGERS IV SOLN: INTRAVENOUS | Status: DC | PRN

## 2024-01-22 MED ORDER — ROCURONIUM BROMIDE 100 MG/10ML IV SOLN
INTRAVENOUS | Status: DC | PRN
Start: 2024-01-22 — End: 2024-01-22
  Administered 2024-01-22: 60 mg via INTRAVENOUS

## 2024-01-22 MED ORDER — BUPIVACAINE-EPINEPHRINE (PF) 0.5% -1:200000 IJ SOLN
INTRAMUSCULAR | Status: AC
  Filled 2024-01-22: qty 30

## 2024-01-22 MED ORDER — PROPOFOL 10 MG/ML IV BOLUS
INTRAVENOUS | Status: DC | PRN
  Administered 2024-01-22: 50 mg via INTRAVENOUS
  Administered 2024-01-22: 100 mg via INTRAVENOUS
  Administered 2024-01-22: 25 ug/kg/min via INTRAVENOUS

## 2024-01-22 MED ORDER — MAGNESIUM OXIDE -MG SUPPLEMENT 400 (240 MG) MG PO TABS
400.0000 mg | ORAL_TABLET | Freq: Every day | ORAL | Status: DC
  Administered 2024-01-23 – 2024-01-27 (×5): 400 mg via ORAL
  Filled 2024-01-22 (×6): qty 1

## 2024-01-22 MED ORDER — DOXAZOSIN MESYLATE 4 MG PO TABS
8.0000 mg | ORAL_TABLET | Freq: Every day | ORAL | Status: DC
  Administered 2024-01-23 – 2024-01-27 (×5): 8 mg via ORAL
  Filled 2024-01-22 (×5): qty 2

## 2024-01-22 MED ORDER — INSULIN ASPART 100 UNIT/ML IJ SOLN
0.0000 [IU] | Freq: Three times a day (TID) | INTRAMUSCULAR | Status: DC
  Administered 2024-01-22: 2 [IU] via SUBCUTANEOUS
  Administered 2024-01-23 – 2024-01-24 (×3): 3 [IU] via SUBCUTANEOUS
  Administered 2024-01-24 (×2): 2 [IU] via SUBCUTANEOUS
  Administered 2024-01-25: 3 [IU] via SUBCUTANEOUS
  Administered 2024-01-25: 2 [IU] via SUBCUTANEOUS
  Administered 2024-01-25: 7 [IU] via SUBCUTANEOUS
  Administered 2024-01-26: 1 [IU] via SUBCUTANEOUS
  Administered 2024-01-26 – 2024-01-27 (×2): 2 [IU] via SUBCUTANEOUS
  Administered 2024-01-27: 3 [IU] via SUBCUTANEOUS
  Filled 2024-01-22 (×12): qty 1

## 2024-01-22 MED ORDER — BUPIVACAINE HCL (PF) 0.5 % IJ SOLN
INTRAMUSCULAR | Status: AC
  Filled 2024-01-22: qty 30

## 2024-01-22 MED ORDER — ACETAMINOPHEN 650 MG RE SUPP: 650.0000 mg | Freq: Four times a day (QID) | RECTAL | Status: DC | PRN

## 2024-01-22 MED ORDER — PROPOFOL 10 MG/ML IV BOLUS
INTRAVENOUS | Status: AC
  Filled 2024-01-22: qty 20

## 2024-01-22 MED ORDER — NOREPINEPHRINE 4 MG/250ML-% IV SOLN
INTRAVENOUS | Status: AC
  Filled 2024-01-22: qty 250

## 2024-01-22 MED ORDER — FENTANYL CITRATE (PF) 100 MCG/2ML IJ SOLN
INTRAMUSCULAR | Status: AC
Start: 2024-01-22 — End: ?
  Filled 2024-01-22: qty 2

## 2024-01-22 MED ORDER — HYDROMORPHONE HCL 1 MG/ML IJ SOLN: 0.2500 mg | INTRAMUSCULAR | Status: DC | PRN

## 2024-01-22 MED ORDER — ORAL CARE MOUTH RINSE: 15.0000 mL | Freq: Once | OROMUCOSAL | Status: AC

## 2024-01-22 MED ORDER — LABETALOL HCL 5 MG/ML IV SOLN
INTRAVENOUS | Status: AC
  Filled 2024-01-22: qty 4

## 2024-01-22 MED ORDER — CEFAZOLIN SODIUM-DEXTROSE 2-4 GM/100ML-% IV SOLN
INTRAVENOUS | Status: AC
  Filled 2024-01-22: qty 100

## 2024-01-22 MED ORDER — OXYCODONE HCL 5 MG PO TABS
5.0000 mg | ORAL_TABLET | ORAL | Status: DC | PRN
  Administered 2024-01-22 – 2024-01-23 (×2): 5 mg via ORAL
  Filled 2024-01-22 (×2): qty 1

## 2024-01-22 MED ORDER — VITAMIN B-12 1000 MCG PO TABS
1000.0000 ug | ORAL_TABLET | Freq: Every day | ORAL | Status: DC
  Administered 2024-01-22 – 2024-01-27 (×5): 1000 ug via ORAL
  Filled 2024-01-22 (×5): qty 1

## 2024-01-22 MED ORDER — MIDAZOLAM HCL 2 MG/2ML IJ SOLN
INTRAMUSCULAR | Status: AC
  Filled 2024-01-22: qty 2

## 2024-01-22 MED ORDER — ACETAMINOPHEN 10 MG/ML IV SOLN
INTRAVENOUS | Status: DC | PRN
  Administered 2024-01-22: 1000 mg via INTRAVENOUS

## 2024-01-22 MED ORDER — DOCUSATE SODIUM 100 MG PO CAPS
100.0000 mg | ORAL_CAPSULE | Freq: Two times a day (BID) | ORAL | Status: DC
  Administered 2024-01-22 – 2024-01-27 (×10): 100 mg via ORAL
  Filled 2024-01-22 (×10): qty 1

## 2024-01-22 MED ORDER — EPHEDRINE SULFATE-NACL 50-0.9 MG/10ML-% IV SOSY
PREFILLED_SYRINGE | INTRAVENOUS | Status: DC | PRN
  Administered 2024-01-22: 15 mg via INTRAVENOUS

## 2024-01-22 MED ORDER — FENTANYL CITRATE PF 50 MCG/ML IJ SOSY
PREFILLED_SYRINGE | INTRAMUSCULAR | Status: AC
  Filled 2024-01-22: qty 1

## 2024-01-22 MED ORDER — CHLORHEXIDINE GLUCONATE 0.12 % MT SOLN
15.0000 mL | Freq: Once | OROMUCOSAL | Status: AC
  Administered 2024-01-22: 15 mL via OROMUCOSAL

## 2024-01-22 MED ORDER — FENTANYL CITRATE (PF) 100 MCG/2ML IJ SOLN
INTRAMUSCULAR | Status: DC | PRN
  Administered 2024-01-22: 50 ug via INTRAVENOUS
  Administered 2024-01-22 (×2): 25 ug via INTRAVENOUS
  Administered 2024-01-22: 50 ug via INTRAVENOUS
  Administered 2024-01-22 (×2): 25 ug via INTRAVENOUS

## 2024-01-22 MED ORDER — DULOXETINE HCL 30 MG PO CPEP
30.0000 mg | ORAL_CAPSULE | Freq: Every day | ORAL | Status: DC
  Administered 2024-01-22 – 2024-01-27 (×6): 30 mg via ORAL
  Filled 2024-01-22 (×6): qty 1

## 2024-01-22 MED ORDER — FENTANYL CITRATE PF 50 MCG/ML IJ SOSY: 50.0000 ug | PREFILLED_SYRINGE | Freq: Once | INTRAMUSCULAR | Status: DC

## 2024-01-22 MED ORDER — INSULIN ASPART 100 UNIT/ML IJ SOLN
5.0000 [IU] | Freq: Once | INTRAMUSCULAR | Status: AC
  Administered 2024-01-22: 5 [IU] via SUBCUTANEOUS

## 2024-01-22 MED ORDER — GABAPENTIN 300 MG PO CAPS
600.0000 mg | ORAL_CAPSULE | Freq: Every day | ORAL | Status: DC
  Administered 2024-01-22 – 2024-01-26 (×5): 600 mg via ORAL
  Filled 2024-01-22 (×4): qty 2

## 2024-01-22 MED ORDER — CHLORHEXIDINE GLUCONATE CLOTH 2 % EX PADS
6.0000 | MEDICATED_PAD | Freq: Every day | CUTANEOUS | Status: DC
  Administered 2024-01-23 – 2024-01-27 (×5): 6 via TOPICAL

## 2024-01-22 MED ORDER — SODIUM CHLORIDE 0.9% FLUSH: 3.0000 mL | Freq: Two times a day (BID) | INTRAVENOUS | Status: DC

## 2024-01-22 MED ORDER — BACLOFEN 10 MG PO TABS
10.0000 mg | ORAL_TABLET | Freq: Three times a day (TID) | ORAL | Status: DC | PRN
  Filled 2024-01-22: qty 1

## 2024-01-22 MED ORDER — FENTANYL CITRATE (PF) 100 MCG/2ML IJ SOLN
INTRAMUSCULAR | Status: AC
  Filled 2024-01-22: qty 2

## 2024-01-22 MED ORDER — GLYCOPYRROLATE 0.2 MG/ML IJ SOLN
INTRAMUSCULAR | Status: DC | PRN
Start: 2024-01-22 — End: 2024-01-22
  Administered 2024-01-22: .2 mg via INTRAVENOUS

## 2024-01-22 MED ORDER — EZETIMIBE 10 MG PO TABS
10.0000 mg | ORAL_TABLET | Freq: Every day | ORAL | Status: DC
Start: 2024-01-22 — End: 2024-01-27
  Administered 2024-01-22 – 2024-01-27 (×6): 10 mg via ORAL
  Filled 2024-01-22 (×5): qty 1

## 2024-01-22 MED ORDER — INSULIN ASPART 100 UNIT/ML IJ SOLN
INTRAMUSCULAR | Status: AC
  Filled 2024-01-22: qty 1

## 2024-01-22 MED ORDER — PHENYLEPHRINE HCL-NACL 20-0.9 MG/250ML-% IV SOLN
INTRAVENOUS | Status: AC
  Filled 2024-01-22: qty 250

## 2024-01-22 MED ORDER — ONDANSETRON 4 MG PO TBDP
4.0000 mg | ORAL_TABLET | Freq: Three times a day (TID) | ORAL | Status: DC | PRN
  Administered 2024-01-23: 4 mg via ORAL

## 2024-01-22 MED ORDER — SODIUM CHLORIDE 0.9% FLUSH: 3.0000 mL | INTRAVENOUS | Status: DC | PRN

## 2024-01-22 MED ORDER — KETAMINE HCL 50 MG/5ML IJ SOSY
PREFILLED_SYRINGE | INTRAMUSCULAR | Status: AC
  Filled 2024-01-22: qty 5

## 2024-01-22 MED ORDER — DOCUSATE SODIUM 100 MG PO CAPS: 100.0000 mg | ORAL_CAPSULE | Freq: Two times a day (BID) | ORAL | Status: DC

## 2024-01-22 MED ORDER — ADULT MULTIVITAMIN W/MINERALS CH
1.0000 | ORAL_TABLET | Freq: Every day | ORAL | Status: DC
  Administered 2024-01-24 – 2024-01-27 (×4): 1 via ORAL
  Filled 2024-01-22 (×4): qty 1

## 2024-01-22 MED ORDER — ONDANSETRON HCL 4 MG PO TABS: 4.0000 mg | ORAL_TABLET | Freq: Three times a day (TID) | ORAL | 0 refills | Status: AC | PRN

## 2024-01-22 MED ORDER — BISACODYL 10 MG RE SUPP
10.0000 mg | Freq: Every day | RECTAL | Status: DC | PRN
  Administered 2024-01-25: 10 mg via RECTAL
  Filled 2024-01-22: qty 1

## 2024-01-22 MED ORDER — PANTOPRAZOLE SODIUM 40 MG PO TBEC
40.0000 mg | DELAYED_RELEASE_TABLET | Freq: Every day | ORAL | Status: DC
  Administered 2024-01-23 – 2024-01-27 (×5): 40 mg via ORAL
  Filled 2024-01-22 (×5): qty 1

## 2024-01-22 MED ORDER — IRBESARTAN 75 MG PO TABS
37.5000 mg | ORAL_TABLET | Freq: Every day | ORAL | Status: DC
  Administered 2024-01-22 – 2024-01-27 (×6): 37.5 mg via ORAL
  Filled 2024-01-22 (×6): qty 0.5

## 2024-01-22 MED ORDER — CEFAZOLIN SODIUM-DEXTROSE 3-4 GM/150ML-% IV SOLN
3.0000 g | INTRAVENOUS | Status: AC
  Administered 2024-01-22: 3 g via INTRAVENOUS
  Filled 2024-01-22: qty 150

## 2024-01-22 MED ORDER — DEXAMETHASONE SODIUM PHOSPHATE 10 MG/ML IJ SOLN
INTRAMUSCULAR | Status: AC
  Filled 2024-01-22: qty 1

## 2024-01-22 MED ORDER — ALBUMIN HUMAN 5 % IV SOLN
INTRAVENOUS | Status: AC
  Filled 2024-01-22: qty 250

## 2024-01-22 MED ORDER — 0.9 % SODIUM CHLORIDE (POUR BTL) OPTIME
TOPICAL | Status: DC | PRN
  Administered 2024-01-22: 1000 mL

## 2024-01-22 SURGICAL SUPPLY — 57 items
BIT DRILL 2 CANN GRADUATED (BIT) IMPLANT
BIT DRILL 2.5 CANN LNG (BIT) IMPLANT
BIT DRILL 2.5 CANN STRL (BIT) IMPLANT
BIT DRILL 2.6 CANN (BIT) IMPLANT
BLADE SURG 15 STRL LF DISP TIS (BLADE) ×1 IMPLANT
BNDG ELASTIC 4X5.8 VLCR NS LF (GAUZE/BANDAGES/DRESSINGS) ×1 IMPLANT
BNDG ELASTIC 6X5.8 VLCR NS LF (GAUZE/BANDAGES/DRESSINGS) IMPLANT
BNDG ESMARCH 6X12 STRL LF (GAUZE/BANDAGES/DRESSINGS) ×1 IMPLANT
BONE CANC CHIPS 20CC PCAN1/4 (Bone Implant) ×1 IMPLANT
CHIPS CANC BONE 20CC PCAN1/4 (Bone Implant) ×1 IMPLANT
CHLORAPREP W/TINT 26 (MISCELLANEOUS) ×1 IMPLANT
CUFF TOURN SGL QUICK 30 NS (TOURNIQUET CUFF) IMPLANT
CUFF TOURN SGL QUICK 34 NS (TOURNIQUET CUFF) IMPLANT
DRAPE C-ARMOR (DRAPES) ×1 IMPLANT
ELECT REM PT RETURN 9FT ADLT (ELECTROSURGICAL) ×1 IMPLANT
ELECTRODE REM PT RTRN 9FT ADLT (ELECTROSURGICAL) ×1 IMPLANT
GAUZE SPONGE 4X4 12PLY STRL (GAUZE/BANDAGES/DRESSINGS) ×1 IMPLANT
GAUZE XEROFORM 1X8 LF (GAUZE/BANDAGES/DRESSINGS) ×1 IMPLANT
GLOVE BIO SURGEON STRL SZ8 (GLOVE) ×2 IMPLANT
GLOVE BIOGEL PI IND STRL 7.0 (GLOVE) ×2 IMPLANT
GLOVE BIOGEL PI IND STRL 8 (GLOVE) ×1 IMPLANT
GOWN STRL REUS W/TWL LRG LVL3 (GOWN DISPOSABLE) IMPLANT
GOWN STRL REUS W/TWL XL LVL3 (GOWN DISPOSABLE) ×1 IMPLANT
GRAFT BNE CANC CHIPS 1-8 20CC (Bone Implant) IMPLANT
GUIDEWIRE 1.35MM (WIRE) IMPLANT
K-WIRE BB-TAK (WIRE) ×2 IMPLANT
K-WIRE FX150X1.6XKRSH (WIRE) IMPLANT
K-WIRE SMOOTH 2.0X150 (WIRE) IMPLANT
KIT TURNOVER KIT A (KITS) ×1 IMPLANT
KWIRE BB-TAK (WIRE) IMPLANT
KWIRE FX150X1.6XKRSH (WIRE) IMPLANT
KWIRE SMOOTH 2.0X150 (WIRE) IMPLANT
MANIFOLD NEPTUNE II (INSTRUMENTS) ×1 IMPLANT
NDL HYPO 21X1.5 SAFETY (NEEDLE) IMPLANT
NEEDLE HYPO 21X1.5 SAFETY (NEEDLE) IMPLANT
NS IRRIG 1000ML POUR BTL (IV SOLUTION) ×1 IMPLANT
PACK EXTREMITY ARMC (MISCELLANEOUS) ×1 IMPLANT
PAD ABD DERMACEA PRESS 5X9 (GAUZE/BANDAGES/DRESSINGS) ×1 IMPLANT
PAD ARMBOARD POSITIONER FOAM (MISCELLANEOUS) ×1 IMPLANT
PAD CAST 4YDX4 CTTN HI CHSV (CAST SUPPLIES) ×1 IMPLANT
PADDING CAST ABS COTTON 4X4 ST (CAST SUPPLIES) ×2 IMPLANT
PLATE LOCK DIST FIB 5H RT (Plate) IMPLANT
SCREW CANN 55 IMPLANT
SCREW CANN T15 ST 50X4 ST (Screw) IMPLANT
SCREW COMP KREULOCK 2.7X12 (Screw) IMPLANT
SCREW COMP KREULOCK 2.7X14 (Screw) IMPLANT
SCREW CORT 3.5X40 LP ANKLE (Screw) IMPLANT
SCREW CORT NL FMS 3.5X45 (Screw) IMPLANT
SCREW LOW PROFILE 3.5X16 (Screw) IMPLANT
SCREW NON-LOCKING 3.5X20 ANKLE (Screw) IMPLANT
SPLINT PLASTER CAST FAST 5X30 (CAST SUPPLIES) IMPLANT
SPONGE T-LAP 18X18 ~~LOC~~+RFID (SPONGE) ×1 IMPLANT
STAPLER SKIN PROX 35W (STAPLE) IMPLANT
SUT 3-0 BLK 1X30 PSL (SUTURE) IMPLANT
SUT MON AB 2-0 CT1 36 (SUTURE) IMPLANT
SUT VIC AB 2-0 CT1 TAPERPNT 27 (SUTURE) ×1 IMPLANT
SYR 30ML LL (SYRINGE) IMPLANT

## 2024-01-22 NOTE — Interval H&P Note (Signed)
 History and Physical Interval Note:  01/22/2024 12:03 PM  Rachel Holmes  has presented today for surgery, with the diagnosis of Right bimalleolar ankle fracture.  The various methods of treatment have been discussed with the patient and family. After consideration of risks, benefits and other options for treatment, the patient has consented to  Procedure(s): OPEN REDUCTION INTERNAL FIXATION (ORIF) ANKLE FRACTURE (Right) as a surgical intervention.  The patient's history has been reviewed, patient examined, no change in status, stable for surgery.  I have reviewed the patient's chart and labs.  Questions were answered to the patient's satisfaction.    ORIF of right ankle fracture.  NWB right leg  Oliver Barre

## 2024-01-22 NOTE — Anesthesia Postprocedure Evaluation (Signed)
 Anesthesia Post Note  Patient: Rachel Holmes  Procedure(s) Performed: OPEN REDUCTION INTERNAL FIXATION (ORIF) ANKLE FRACTURE (Right: Ankle)  Patient location during evaluation: PACU Anesthesia Type: General Level of consciousness: awake and alert Pain management: pain level controlled Vital Signs Assessment: post-procedure vital signs reviewed and stable Respiratory status: spontaneous breathing, nonlabored ventilation, respiratory function stable and patient connected to nasal cannula oxygen Cardiovascular status: blood pressure returned to baseline and stable Postop Assessment: no apparent nausea or vomiting Anesthetic complications: no   No notable events documented.   Last Vitals:  Vitals:   01/22/24 1600 01/22/24 1615  BP: (!) 157/73 (!) 147/60  Pulse: 72 69  Resp: (!) 9   Temp:    SpO2: 97% (!) 88%    Last Pain:  Vitals:   01/22/24 1600  PainSc: 0-No pain                 Louie Boston

## 2024-01-22 NOTE — Progress Notes (Signed)
 Pt received from PACU with [place] on. Marylene Land, RN text paged Dr Enedina Finner to verify if pt is okay to keep the [facility]. Above MD agreed.

## 2024-01-22 NOTE — Anesthesia Preprocedure Evaluation (Signed)
 Anesthesia Evaluation  Patient identified by MRN, date of birth, ID band Patient awake    Reviewed: Allergy & Precautions, NPO status , Patient's Chart, lab work & pertinent test results  History of Anesthesia Complications (+) PONV and history of anesthetic complications  Airway Mallampati: III  TM Distance: >3 FB Neck ROM: full    Dental  (+) Chipped   Pulmonary shortness of breath and with exertion   Pulmonary exam normal        Cardiovascular hypertension, + Peripheral Vascular Disease and +CHF  (-) Past MI Normal cardiovascular exam     Neuro/Psych  Neuromuscular disease  negative psych ROS   GI/Hepatic Neg liver ROS,GERD  Controlled,,  Endo/Other  diabetes, Type 2    Renal/GU Renal disease     Musculoskeletal   Abdominal   Peds  Hematology negative hematology ROS (+)   Anesthesia Other Findings Past Medical History: No date: Allergic rhinitis No date: Allergy No date: Anemia No date: Anxiety 09/13/2016: Aortic stenosis     Comment:  a.) TTE 09/13/2016: mild AS --> MPG 11.2 mmHg. b.) TTE               12/27/2020: EF 55-60%; no AS --> MPG 8 mmHg. 12/25/2018: Aortic valve endocarditis     Comment:  a.) TTE 12/25/2018: 0.75 x 1 cm AV mass. b.) TEE               12/28/2018: small shaggy mobile density on aortic side of              RIGHT coronary cusp consistent with vegetation. No date: Arthritis No date: Back pain, chronic 01/2024: Bimalleolar fracture of right ankle No date: Bradycardia No date: Breast mass     Comment:  Patient can no longer palpate specific masses but showed              tech general area of concern No date: Charcot's joint of ankle, left 07/09/2007: CHF (congestive heart failure) (HCC)     Comment:  a.) TTE 07/09/2007: EF 50%; G1DD. b.) TTE 09/13/2016: EF              55%; G2DD. c.) TTE 12/27/2020: EF 55-60%; G2DD; GLS               -15.1%.; d.) TTE 09/04/2023: EF 50-55%, G1DD,  mild LAE,               mild MR, mod AoV calc No date: Complication of anesthesia     Comment:  a.) PONV. b.) Delayed emergence No date: Constipation No date: Diabetic nephropathy (HCC) No date: DVT (deep venous thrombosis) (HCC) No date: Dyspnea No date: ESRD (end stage renal disease) on dialysis (HCC) No date: Family history of adverse reaction to anesthesia     Comment:  a.) Sisters x 2 with (+) delayed emergence. No date: GERD (gastroesophageal reflux disease) No date: Heart murmur No date: HLD (hyperlipidemia) No date: Hyperparathyroidism (HCC) No date: Hypertension No date: Legally blind in left eye, as defined in [place] No date: Lymphedema 09/13/2016: Mild pulmonary hypertension (HCC)     Comment:  a.) TTE 09/13/2016: EF 55%; RVSP 45 mmHg. b.) TTE               12/25/2018: EF 55-60%; RVSP 35.5 mmHg. No date: Obesity No date: Onychomycosis No date: PONV (postoperative nausea and vomiting) No date: S/P BKA (below knee amputation) unilateral, left (HCC) 12/2018: Sepsis (HCC)     Comment:  a.) group  G streptococcal bacteremia with AV               endocarditis and LLE soft tissue infection. No date: T2DM (type 2 diabetes mellitus) (HCC)  Past Surgical History: 06/18/2022: A/V FISTULAGRAM; Left     Comment:  Procedure: A/V Fistulagram;  Surgeon: Renford Dills, MD;  Location: [facility] INVASIVE CV LAB;  Service:               Cardiovascular;  Laterality: Left; 08/13/2022: A/V FISTULAGRAM; Left     Comment:  Procedure: A/V Fistulagram;  Surgeon: Renford Dills, MD;  Location: [facility]VASIVE CV LAB;  Service:               Cardiovascular;  Laterality: Left; No date: ABDOMINAL HYSTERECTOMY 05/05/2019: AMPUTATION; Left     Comment:  Procedure: AMPUTATION BELOW KNEE;  Surgeon: Renford Dills, MD;  Location: [facility] ORS;  Service: Vascular;                Laterality: Left; 09/19/2021: AV FISTULA PLACEMENT; Right     Comment:   Procedure: ARTERIOVENOUS (AV) FISTULA CREATION (BRACHIAL              CEPHALIC);  Surgeon: Renford Dills, MD;  Location:               [facility] ORS;  Service: Vascular;  Laterality: Right; 11/09/2021: AV FISTULA PLACEMENT; Left     Comment:  Procedure: ARTERIOVENOUS (AV) FISTULA CREATION (               BRACHIAL CEPHALIC );  Surgeon: Renford Dills, MD;                Location: [facility] ORS;  Service: Vascular;  Laterality:               Left; 2014: BREAST BIOPSY; Left     Comment:  FNA 12:00 position - Negative No date: CATARACT EXTRACTION; Bilateral 03/28/2022: DIALYSIS/PERMA CATHETER INSERTION; N/A     Comment:  Procedure: DIALYSIS/PERMA CATHETER INSERTION;  Surgeon:               Annice Needy, MD;  Location: [facility] INVASIVE CV LAB;                Service: Cardiovascular;  Laterality: N/A; 12/09/2022: DIALYSIS/PERMA CATHETER REMOVAL; N/A     Comment:  Procedure: DIALYSIS/PERMA CATHETER REMOVAL;  Surgeon:               Annice Needy, MD;  Location: [facility] INVASIVE CV LAB;                Service: Cardiovascular;  Laterality: N/A; 2007: EYE SURGERY; Left     Comment:  removed a lens, no lens implanted 12/29/2018: IR FLUORO GUIDE CV LINE RIGHT 02/19/2019: IR REMOVAL TUN CV CATH W/O FL 12/29/2018: IR US GUIDE VASC ACCESS RIGHT 10/16/2022: REVISON OF ARTERIOVENOUS FISTULA; Left     Comment:  Procedure: REVISON OF ARTERIOVENOUS FISTULA;  Surgeon:               Renford Dills, MD;  Location: [facility] ORS;  Service:               Vascular;  Laterality: Left; 12/28/2018: TEE WITHOUT  CARDIOVERSION; N/A     Comment:  Procedure: TRANSESOPHAGEAL ECHOCARDIOGRAM (TEE);                Surgeon: Quintella Reichert, MD;  Location: [facility] ENDOSCOPY;                Service: Cardiovascular;  Laterality: N/A;     Reproductive/Obstetrics negative OB ROS                             Anesthesia Physical Anesthesia Plan  ASA: 4  Anesthesia Plan: General ETT   Post-op Pain Management:  Regional block*   Induction: Intravenous  PONV Risk Score and Plan: Ondansetron, Dexamethasone, Midazolam and Treatment may vary due to age or medical condition  Airway Management Planned: Oral ETT  Additional Equipment:   Intra-op Plan:   Post-operative Plan: Extubation in OR  Informed Consent: I have reviewed the patients History and Physical, chart, labs and discussed the procedure including the risks, benefits and alternatives for the proposed anesthesia with the patient or authorized representative who has indicated his/her understanding and acceptance.     Dental Advisory Given  Plan Discussed with: Anesthesiologist, CRNA and Surgeon  Anesthesia Plan Comments: (Patient consented for risks of anesthesia including but not limited to:  - adverse reactions to medications - damage to eyes, teeth, lips or other oral mucosa - nerve damage due to positioning  - sore throat or hoarseness - Damage to heart, brain, nerves, lungs, other parts of body or loss of life  Patient voiced understanding and assent.)       Anesthesia Quick Evaluation

## 2024-01-22 NOTE — H&P (Signed)
 History and Physical    Patient: Rachel Holmes YQM:[id number] DOB: 10-28-1961 DOA: 01/22/2024 DOS: the patient was seen and examined on 01/22/2024 PCP: Rayetta Humphrey, MD  Patient coming from: Home  Chief Complaint: postop right ankle fracture. Patient is overwhelmed and feels she needs to stay in the hospital to get PT evaluation and help at home. I will request was per orthopedic Dr.Cairns  HPI: Rachel Holmes is a 63 y.o. female with medical history significant of end-stage renal disease on hemodialysis Monday Wednesday Friday, arthritis, history of congestive heart failure diastolic, type II diabetes on insulin, diabetic nephropathy, hyperlipidemia, is being admitted after undergoing right Bimalleolar ankle fracture surgery due to ongoing pain and malunion. Patient had sustained fracture in early part of January and had a boot placed.  Patient felt she would want to have PT evaluation prior to discharge home. She lives at home with her son. She has a prosthetic leg on the left.  Patient is being admitted for further evaluation management. She was seen in the PACU. Answered most questions appropriately. Her surgery went uneventful.   Review of Systems: As mentioned in the history of present illness. All other systems reviewed and are negative. Past Medical History:  Diagnosis Date   Allergic rhinitis    Allergy    Anemia    Anxiety    Aortic stenosis 09/13/2016   a.) TTE 09/13/2016: mild AS --> MPG 11.2 mmHg. b.) TTE 12/27/2020: EF 55-60%; no AS --> MPG 8 mmHg.   Aortic valve endocarditis 12/25/2018   a.) TTE 12/25/2018: 0.75 x 1 cm AV mass. b.) TEE 12/28/2018: small shaggy mobile density on aortic side of RIGHT coronary cusp consistent with vegetation.   Arthritis    Back pain, chronic    Bimalleolar fracture of right ankle 01/2024   Bradycardia    Breast mass    Patient can no longer palpate specific masses but showed tech general area of concern   Charcot's joint of  ankle, left    CHF (congestive heart failure) (HCC) 07/09/2007   a.) TTE 07/09/2007: EF 50%; G1DD. b.) TTE 09/13/2016: EF 55%; G2DD. c.) TTE 12/27/2020: EF 55-60%; G2DD; GLS -15.1%.; d.) TTE 09/04/2023: EF 50-55%, G1DD, mild LAE, mild MR, mod AoV calc   Complication of anesthesia    a.) PONV. b.) Delayed emergence   Constipation    Diabetic nephropathy (HCC)    DVT (deep venous thrombosis) (HCC)    Dyspnea    ESRD (end stage renal disease) on dialysis [facility])    Family history of adverse reaction to anesthesia    a.) Sisters x 2 with (+) delayed emergence.   GERD (gastroesophageal reflux disease)    Heart murmur    HLD (hyperlipidemia)    Hyperparathyroidism (HCC)    Hypertension    Legally blind in left eye, as defined in [place]    Lymphedema    Mild pulmonary hypertension (HCC) 09/13/2016   a.) TTE 09/13/2016: EF 55%; RVSP 45 mmHg. b.) TTE 12/25/2018: EF 55-60%; RVSP 35.5 mmHg.   Obesity    Onychomycosis    PONV (postoperative nausea and vomiting)    S/P BKA (below knee amputation) unilateral, left (HCC)    Sepsis (HCC) 12/2018   a.) group G streptococcal bacteremia with AV endocarditis and LLE soft tissue infection.   T2DM (type 2 diabetes mellitus) [facility])    Past Surgical History:  Procedure Laterality Date   A/V FISTULAGRAM Left 06/18/2022   Procedure: A/V Fistulagram;  Surgeon: Levora Dredge  G, MD;  Location: [facility] INVASIVE CV LAB;  Service: Cardiovascular;  Laterality: Left;   A/V FISTULAGRAM Left 08/13/2022   Procedure: A/V Fistulagram;  Surgeon: Renford Dills, MD;  Location: [facility] INVASIVE CV LAB;  Service: Cardiovascular;  Laterality: Left;   ABDOMINAL HYSTERECTOMY     AMPUTATION Left 05/05/2019   Procedure: AMPUTATION BELOW KNEE;  Surgeon: Renford Dills, MD;  Location: [facility] ORS;  Service: Vascular;  Laterality: Left;   AV FISTULA PLACEMENT Right 09/19/2021   Procedure: ARTERIOVENOUS (AV) FISTULA CREATION (BRACHIAL CEPHALIC);  Surgeon: Renford Dills, MD;   Location: [facility] ORS;  Service: Vascular;  Laterality: Right;   AV FISTULA PLACEMENT Left 11/09/2021   Procedure: ARTERIOVENOUS (AV) FISTULA CREATION ( BRACHIAL CEPHALIC );  Surgeon: Renford Dills, MD;  Location: [facility] ORS;  Service: Vascular;  Laterality: Left;   BREAST BIOPSY Left 2014   FNA 12:00 position - Negative   CATARACT EXTRACTION Bilateral    DIALYSIS/PERMA CATHETER INSERTION N/A 03/28/2022   Procedure: DIALYSIS/PERMA CATHETER INSERTION;  Surgeon: Annice Needy, MD;  Location: [facility] INVASIVE CV LAB;  Service: Cardiovascular;  Laterality: N/A;   DIALYSIS/PERMA CATHETER REMOVAL N/A 12/09/2022   Procedure: DIALYSIS/PERMA CATHETER REMOVAL;  Surgeon: Annice Needy, MD;  Location: [facility] INVASIVE CV LAB;  Service: Cardiovascular;  Laterality: N/A;   EYE SURGERY Left 2007   removed a lens, no lens implanted   IR FLUORO GUIDE CV LINE RIGHT  12/29/2018   IR REMOVAL TUN CV CATH W/O FL  02/19/2019   IR US GUIDE VASC ACCESS RIGHT  12/29/2018   REVISON OF ARTERIOVENOUS FISTULA Left 10/16/2022   Procedure: REVISON OF ARTERIOVENOUS FISTULA;  Surgeon: Renford Dills, MD;  Location: [facility] ORS;  Service: Vascular;  Laterality: Left;   TEE WITHOUT CARDIOVERSION N/A 12/28/2018   Procedure: TRANSESOPHAGEAL ECHOCARDIOGRAM (TEE);  Surgeon: Quintella Reichert, MD;  Location: [facility] ENDOSCOPY;  Service: Cardiovascular;  Laterality: N/A;   Social History:  reports that she has never smoked. She has never used smokeless tobacco. She reports that she does not drink alcohol and does not use drugs.  Allergies  Allergen Reactions   Nifedipine Rash   Penicillins Hives, Shortness Of Breath and Swelling   Statins Shortness Of Breath    Wheezing   Hydralazine Itching   Hydrocodone Itching and Nausea Only   Ivp Dye [Iodinated Contrast Media]     ESRD   Metoprolol Diarrhea and Other (See Comments)    Pt states "make my heart race"   Oxycodone Nausea Only   Codeine Nausea Only   Ibuprofen Other (See Comments)     Raises blood pressure    Family History  Problem Relation Age of Onset   Breast cancer Sister 85   Diabetes Sister    Diabetes Mother    Hypertension Mother    Hyperlipidemia Mother    Eating disorder Mother    Obesity Mother     Prior to Admission medications   Medication Sig Start Date End Date Taking? Authorizing Provider  acetaminophen (TYLENOL) 500 MG tablet Take 1,000 mg by mouth every 6 (six) hours as needed for moderate pain (pain score 4-6).   Yes [provider]  acetaminophen (TYLENOL) 500 MG tablet Take 2 tablets (1,000 mg total) by mouth every 6 (six) hours as needed. 01/22/24 01/21/25 Yes Oliver Barre, MD  albuterol (PROVENTIL HFA;VENTOLIN HFA) 108 (90 Base) MCG/ACT inhaler Inhale 1-2 puffs into the lungs every 6 (six) hours as needed for wheezing or shortness of breath.  Yes [provider]  aspirin EC 81 MG EC tablet Take 1 tablet (81 mg total) by mouth daily. 05/11/19  Yes Stegmayer, Ranae Plumber, PA-C  aspirin EC 81 MG tablet Take 1 tablet (81 mg total) by mouth in the morning and at bedtime. Swallow whole. 01/22/24 03/04/24 Yes Oliver Barre, MD  baclofen (LIORESAL) 10 MG tablet Take 10 mg by mouth 3 (three) times daily as needed for muscle spasms.   Yes [provider]  calcitRIOL (ROCALTROL) 0.25 MCG capsule Take 0.25 mcg by mouth every Monday, Wednesday, and Friday with hemodialysis. 10/23/21  Yes [provider]  cetirizine (ZYRTEC) 10 MG tablet Take 10 mg by mouth at bedtime.   Yes [provider]  clobetasol cream (TEMOVATE) 0.05 % Apply 1 application  topically 2 (two) times daily as needed (rash).   Yes [provider]  cloNIDine (CATAPRES) 0.3 MG tablet Take 1 tablet (0.3 mg total) by mouth 2 (two) times daily. 06/18/21  Yes Gollan, Tollie Pizza, MD  docusate sodium (COLACE) 100 MG capsule Take 1 capsule (100 mg total) by mouth 2 (two) times daily. 05/10/19  Yes Stegmayer, Cala Bradford A, PA-C  doxazosin (CARDURA) 8 MG  tablet Take 8 mg by mouth daily.   Yes [provider]  DULoxetine (CYMBALTA) 30 MG capsule Take 30 mg by mouth at bedtime. 08/28/18  Yes [provider]  ezetimibe (ZETIA) 10 MG tablet Take 1 tablet (10 mg total) by mouth daily. 06/18/21  Yes Gollan, Tollie Pizza, MD  gabapentin (NEURONTIN) 600 MG tablet Take 600 mg by mouth at bedtime.   Yes [provider]  glucosamine-chondroitin 500-400 MG tablet Take 1 tablet by mouth in the morning and at bedtime.   Yes [provider]  hydrOXYzine (ATARAX) 25 MG tablet Take 25 mg by mouth every 6 (six) hours as needed for itching.   Yes [provider]  insulin degludec (TRESIBA) 100 UNIT/ML FlexTouch Pen Inject 14 Units into the skin at bedtime. 12/01/23  Yes [provider]  isosorbide mononitrate (IMDUR) 120 MG 24 hr tablet Take 1 tablet (120 mg total) by mouth daily. Patient taking differently: Take 120 mg by mouth in the morning and at bedtime. 06/18/21  Yes Gollan, Tollie Pizza, MD  lactulose (CHRONULAC) 10 GM/15ML solution Take 15 mLs by mouth daily as needed for moderate constipation. 06/14/22  Yes [provider]  LYUMJEV KWIKPEN 100 UNIT/ML KwikPen Inject 10-20 Units into the skin in the morning and at bedtime. With meals 12/01/23  Yes [provider]  Magnesium 400 MG CAPS Take 400 mg by mouth at bedtime.   Yes [provider]  MOUNJARO 7.5 MG/0.5ML Pen Inject 7.5 mg into the skin once a week. 12/29/23  Yes [provider]  Multiple Vitamin (MULTIVITAMIN WITH MINERALS) TABS tablet Take 1 tablet by mouth daily.   Yes [provider]  olmesartan (BENICAR) 40 MG tablet Take 40 mg by mouth daily.   Yes [provider]  omeprazole (PRILOSEC) 40 MG capsule Take 40 mg by mouth 2 (two) times daily with breakfast and lunch.   Yes [provider]  ondansetron (ZOFRAN) 4 MG tablet Take 1 tablet (4 mg total) by mouth every 8 (eight) hours as needed for up  to 14 days for nausea or vomiting. 01/22/24 02/05/24 Yes Oliver Barre, MD  ondansetron (ZOFRAN-ODT) 4 MG disintegrating tablet Take 1 tablet (4 mg total) by mouth every 8 (eight) hours as needed for nausea or vomiting. 02/16/23  Yes Minna Antis, MD  oxyCODONE (ROXICODONE) 5 MG immediate release tablet Take 1 tablet (5 mg total) by mouth every 4 (four) hours as needed for up to 7 days. 01/22/24 01/29/24 Yes Oliver Barre, MD  torsemide (DEMADEX) 100 MG tablet Take 1 tablet (100 mg total) by mouth 2 (two) times daily. 08/18/23  Yes Gollan, Tollie Pizza, MD  traMADol (ULTRAM) 50 MG tablet Take 1 tablet (50 mg total) by mouth every 12 (twelve) hours as needed. 12/18/23  Yes Oliver Barre, MD  vitamin B-12 (CYANOCOBALAMIN) 1000 MCG tablet Take 1,000 mcg by mouth daily.   Yes [provider]  Continuous Blood Gluc Receiver (FREESTYLE LIBRE 14 DAY READER) DEVI USE AS DIRECTED 07/13/20   Romero Belling, MD  Lancets [facility] ULTRASOFT) lancets Used to check blood sugars four times daily. 03/26/18   Romero Belling, MD  [facility] VERIO test strip USE TO CHECK BLOOD SUGAR TWICE DAILY 01/21/22   Romero Belling, MD    Physical Exam: Vitals:   01/22/24 1600 01/22/24 1615 01/22/24 1630 01/22/24 1645  BP: (!) 157/73 (!) 147/60 124/82 (!) 140/70  Pulse: 72 69 68 66  Resp: (!) 9     Temp:      SpO2: 97% (!) 88% 92% 96%  Physical Exam Constitutional:      Appearance: Normal appearance.  Eyes:     Extraocular Movements: Extraocular movements intact.     Pupils: Pupils are equal, round, and reactive to light.  Cardiovascular:     Rate and Rhythm: Normal rate and regular rhythm.  Pulmonary:     Effort: Pulmonary effort is normal.     Breath sounds: Normal breath sounds.  Abdominal:     General: Abdomen is flat.     Palpations: Abdomen is soft.  Skin:    General: Skin is warm and dry.  Neurological:     General: No focal deficit present.     Mental Status: She is alert and oriented to person, place,  and time.     Assessment and Plan:  PHENIX GREIN is a 63 y.o. female with medical history significant of end-stage renal disease on hemodialysis Monday Wednesday Friday, arthritis, history of congestive heart failure diastolic, type II diabetes on insulin, diabetic nephropathy, hyperlipidemia, is being admitted after undergoing right Bimalleolar ankle fracture surgery due to ongoing pain and malunion. Patient had sustained fracture in early part of January and had a boot placed.  Status post right Bimalleolar ankle fracture surgery due to ongoing pain and malunion.--Elective surgery -- Postop orders for orthopedic surgery -- DVT prophylaxis per ortho surgery -- will have PT OT see patient. TOC for discharge planning  End-stage renal disease on hemodialysis Monday Wednesday Friday -- consultation with Dr. Cherylann Ratel for routine dialysis  Type II diabetes, insulin requiring, uncontrolled, neuropathy -- will resume home dose of insulin including sliding scale -- check A1c-- last A1c October 2024 was 7.9 -- continue gabapentin  Hypertension -- resume Cardura, imdur, olmesartan  for now -- and remainder of the meds as blood pressure labs  Hyperlipidemia -- patient is on Zetia  TOC for discharge planning           Advance Care Planning:   Code Status: Full Code   Family Communication: none today  Severity of Illness: The appropriate patient status for this patient is OBSERVATION. Observation status is judged to be reasonable and necessary in order to provide the required intensity of service to ensure the patient's safety. The patient's presenting symptoms,  physical exam findings, and initial radiographic and laboratory data in the context of their medical condition is felt to place them at decreased risk for further clinical deterioration. Furthermore, it is anticipated that the patient will be medically stable for discharge from the hospital within 2 midnights of admission.    Author: Enedina Finner, MD 01/22/2024 5:23 PM  For on call review [email or web address].

## 2024-01-22 NOTE — Anesthesia Procedure Notes (Signed)
 Procedure Name: Intubation Date/Time: 01/22/2024 12:26 PM  Performed by: Darrell Jewel I, CRNAPre-anesthesia Checklist: Patient identified, Patient being monitored, Timeout performed, Emergency Drugs available and Suction available Patient Re-evaluated:Patient Re-evaluated prior to induction Oxygen Delivery Method: Circle system utilized Preoxygenation: Pre-oxygenation with 100% oxygen Induction Type: IV induction Ventilation: Mask ventilation without difficulty Laryngoscope Size: 3 and McGrath Grade View: Grade I Tube type: Oral Tube size: 7.0 mm Number of attempts: 1 Airway Equipment and Method: Stylet Placement Confirmation: ETT inserted through vocal cords under direct vision, positive ETCO2 and breath sounds checked- equal and bilateral Secured at: 23 cm Tube secured with: Tape Dental Injury: Teeth and Oropharynx as per pre-operative assessment

## 2024-01-22 NOTE — Progress Notes (Signed)
 Received consult to provide dialysis to this patient in the am. Hepatitis B Ag/Ab quant ordered to be collected today to expedite treatment for tomorrow. Will place orders for hemodialysis tomorrow and provide full note at that time.

## 2024-01-22 NOTE — Transfer of Care (Signed)
 Immediate Anesthesia Transfer of Care Note  Patient: Rachel Holmes  Procedure(s) Performed: OPEN REDUCTION INTERNAL FIXATION (ORIF) ANKLE FRACTURE (Right: Ankle)  Patient Location: PACU  Anesthesia Type:General  Level of Consciousness: responds to stimulation  Airway & Oxygen Therapy: Patient Spontanous Breathing and Patient connected to face mask oxygen  Post-op Assessment: Report given to RN and Post -op Vital signs reviewed and stable  Post vital signs: stable  Last Vitals:  Vitals Value Taken Time  BP 164/70 01/22/24 1530  Temp    Pulse 72 01/22/24 1536  Resp 12 01/22/24 1536  SpO2 96 % 01/22/24 1536  Vitals shown include unfiled device data.  Last Pain: There were no vitals filed for this visit.       Complications: No notable events documented.

## 2024-01-22 NOTE — Op Note (Signed)
 Orthopaedic Surgery Operative Note (CSN: [id number])  Rachel Holmes  Feb 21, 1961 Date of Surgery: 01/22/2024   Diagnoses:  Right bimalleolar ankle fracture, malunion  Procedure: ORIF of right bimalleolar ankle fracture Osteotomy of right bimalleolar ankle fracture    Operative Finding Successful completion of the planned procedure.    Post-Op Diagnosis: Same Surgeons:Primary: Oliver Barre, MD Assistants: Griffin Basil, RN Location: [facility] OR ROOM 07 Anesthesia: General with local anesthesia Antibiotics: Ancef 3 g Tourniquet time: 130 minutes Estimated Blood Loss: 150 cc Complications: None Specimens: None  Implants: Implant Name Type Inv. Item Serial No. Manufacturer Lot No. LRB No. Used Action  BONE CANC CHIPS 20CC PCAN1/4 - [id number] Bone Implant BONE [facility] CHIPS 20CC PCAN1/4 [id number] [employer]  Right 1 Implanted  SCREW CANN T15 ST 50X4 ST - [id number] Screw SCREW CANN T15 ST 50X4 ST  [employer]  Right 1 Implanted  SCREW COMP KREULOCK 2.7X14 - [id number] Screw SCREW COMP KREULOCK 2.7X14  [employer]  Right 2 Implanted  SCREW COMP KREULOCK 2.7X12 - [id number] Screw SCREW COMP KREULOCK 2.7X12  [employer]  Right 3 Implanted  SCREW NON-LOCKING 3.5X20 ANKLE - [id number] Screw SCREW NON-LOCKING 3.5X20 ANKLE  [employer]  Right 1 Implanted  SCREW LOW PROFILE 3.5X16 - [id number] Screw SCREW LOW PROFILE 3.5X16  [employer]  Right 1 Implanted  SCREW CORT 3.5X40 LP ANKLE - [id number] Screw SCREW CORT 3.5X40 LP ANKLE  [employer]  Right 1 Implanted  SCREW CORT NL FMS 3.5X45 - [id number] Screw SCREW CORT NL FMS 3.5X45  [employer]  Right 1 Implanted  PLATE LOCK DIST FIB 5H RT - [id number] Plate PLATE LOCK DIST FIB 5H RT  [employer]  Right 1 Implanted    Indications for Surgery:   Rachel Holmes is a 63 y.o. female who sustained a right ankle fracture, which has progressively worsened.  Initially, radiographs demonstrated a minimally displaced distal fibula fracture.   However, she continued to bear weight, despite immobilization.  The fracture had progressed, and was healing in a malunited position.  As a result, I made the recommendation to proceed with operative fixation, to stabilize the fracture, and give her a better chance of a stable ankle.  Benefits and risks of operative and nonoperative management were discussed prior to surgery with the patient and informed consent form was completed.  Specific risks including infection, need for additional surgery, nonunion, malunion, amputation, damage to surrounding structures, blood clots and more severe complications associated with anesthesia.  She would like to proceed.  Surgical consent was finalized.   Procedure:   The patient was identified properly. Informed consent was obtained and the surgical site was marked. The patient was taken to the OR where general anesthesia was induced.  The patient was positioned supine, with her leg on bone foam.  A bump was placed under her right hip..  The right leg was prepped and draped in the usual sterile fashion.  Timeout was performed before the beginning of the case.  Tourniquet was used for the above duration.  We started by making an incision on the lateral side of the ankle.  We dissected through skin, which was very thick.  We continued to dissect through subcutaneous tissue, to the lateral aspect of the fibula.  We encountered a large amount of scar tissue, around the fracture site.  This was debrided with combination of the elevator, as well as a rongeurs.  The fracture site was identified.  We continued to remove scar tissue  from the fracture until the distal fragment was fully mobilized.  Given the chronicity of the injury, there was no appropriate key for reduction.  Fluoroscopy was used in order to evaluate the fracture.  The distal fragment was mobile, but were unable to reduce the ankle.  As a result, made the decision to proceed with osteotomy of the medial  malleolus fracture fragment.  We made a curvilinear incision overlying the distal aspect of the medial tibia.  We dissected through skin, then dissected bluntly through subcutaneous tissue.  The nerve and vein were protected throughout the this portion of the case.  The medial aspect of the distal tibia was then identified.  We are able to identify the fracture site.  This was confirmed under fluoroscopy.  We used an osteotomy to free up the large medial malleolus fracture fragment.  Once were satisfied that this had been completely released, we confirmed this under fluoroscopy.  We then used a point-to-point reduction clamp, to maintain reduction of the medial malleolus.  We had clear visibility.  We attempted to use a plate, but these were not contoured appropriately.  As a result, I made the decision to proceed with partially-threaded, cannulated screws.  A K wire was placed across the fracture site, approximately orthogonal to the fracture.  This was confirmed on fluoroscopy.  A second K wire was introduced.  We then placed 2 cannulated screws across the fracture site, obtaining adequate compression.  We had a good reduction.  This was confirmed under fluoroscopy.  We then turned our attention back to the distal fibula.  We were able to restore our length much better at this time.  We used a towel clamp to pull traction.  Once we were satisfied that we were able to achieve restoration of the length on the distal fibula, we provisionally placed a plate on the lateral aspect of the fibula.  This was confirmed under fluoroscopy.  We then secured the distal cluster to the distal fragment in appropriate position using multiple locking screws.  This gave [name] complete control of the distal fragment.  We then pulled traction using the plate, and subsequently reduced the plate back to the proximal fragment of the fibula.  At this point, we placed a single screw to secure the plate to the proximal portion of the  fibula.  We used the oblong screw, and it was placed in the most distal portion of the fibula.  This was a bicortical screw.  The fracture was critically evaluated under fluoroscopy.  At this point, we were still little bit short.  The screw was subsequently loosened, and we were able to achieve additional length by pulling through the plate.  The single bicortical screw was subsequently tightened.  Fluoroscopy confirmed an adequate reduction.  We then placed additional bicortical screws in the proximal extent of the plate.  Given the extent of the injury, as well as the patient's comorbidities, including diabetes and end-stage renal disease, I made the decision to place multiple tricortical syndesmotic screws.  This was done under direct visualization using fluoroscopy.  Overall, the ankle was secure.  The reduction was much better.  However, there appeared to be some impaction through the plafond and.  We made a small cortical window through the lateral incision, and attempted to improve the joint surface.  This did not change the shadowing that we saw on the fluoroscopy.  The tibiotalar joint was evaluated critically on lateral views of the x-ray.  There was no fragments  of the plafond which had been impacted.  Nothing further was attempted at this point.  We placed some graft in both the distal fibula fracture, as well as within the small window created within the distal tibia.  This was impacted with a tamp.  We irrigated the wound copiously.  We closed the incision 2-0 nylon.  Sterile dressing was placed, followed by a well-padded short leg splint.  Patient was awoken taken to PACU in stable condition.   Post-operative plan:  The patient will be NWB on the operative extremity Discharge home from the PACU once they have recovered DVT prophylaxis Aspirin 81 mg twice daily for 6 weeks.    Pain control with PRN pain medication preferring oral medicines.   Follow up plan will be scheduled in approximately  14 days for incision check and XR.

## 2024-01-23 ENCOUNTER — Encounter: Payer: Self-pay | Admitting: Orthopedic Surgery

## 2024-01-23 DIAGNOSIS — S82891P Other fracture of right lower leg, subsequent encounter for closed fracture with malunion: Secondary | ICD-10-CM

## 2024-01-23 DIAGNOSIS — Z992 Dependence on renal dialysis: Secondary | ICD-10-CM | POA: Diagnosis not present

## 2024-01-23 DIAGNOSIS — S82841P Displaced bimalleolar fracture of right lower leg, subsequent encounter for closed fracture with malunion: Secondary | ICD-10-CM | POA: Diagnosis not present

## 2024-01-23 DIAGNOSIS — N186 End stage renal disease: Secondary | ICD-10-CM | POA: Diagnosis not present

## 2024-01-23 DIAGNOSIS — E119 Type 2 diabetes mellitus without complications: Secondary | ICD-10-CM | POA: Diagnosis not present

## 2024-01-23 LAB — RENAL FUNCTION PANEL
Albumin: 2.9 g/dL — ABNORMAL LOW (ref 3.5–5.0)
Anion gap: 11 (ref 5–15)
BUN: 43 mg/dL — ABNORMAL HIGH (ref 8–23)
CO2: 25 mmol/L (ref 22–32)
Calcium: 8 mg/dL — ABNORMAL LOW (ref 8.9–10.3)
Chloride: 98 mmol/L (ref 98–111)
Creatinine, Ser: 4.76 mg/dL — ABNORMAL HIGH (ref 0.44–1.00)
GFR, Estimated: 10 mL/min — ABNORMAL LOW (ref >60)
Glucose, Bld: 185 mg/dL — ABNORMAL HIGH (ref 70–99)
Phosphorus: 4.6 mg/dL (ref 2.5–4.6)
Potassium: 3.9 mmol/L (ref 3.5–5.1)
Sodium: 134 mmol/L — ABNORMAL LOW (ref 135–145)

## 2024-01-23 LAB — CBC
HCT: 29.8 % — ABNORMAL LOW (ref 36.0–46.0)
Hemoglobin: 9.1 g/dL — ABNORMAL LOW (ref 12.0–15.0)
MCH: 29.2 pg (ref 26.0–34.0)
MCHC: 30.5 g/dL (ref 30.0–36.0)
MCV: 95.5 fL (ref 80.0–100.0)
Platelets: 184 10*3/uL (ref 150–400)
RBC: 3.12 MIL/uL — ABNORMAL LOW (ref 3.87–5.11)
RDW: 14.5 % (ref 11.5–15.5)
WBC: 7.3 10*3/uL (ref 4.0–10.5)
nRBC: 0 % (ref 0.0–0.2)

## 2024-01-23 LAB — GLUCOSE, CAPILLARY
Glucose-Capillary: 241 mg/dL — ABNORMAL HIGH (ref 70–99)
Glucose-Capillary: 227 mg/dL — ABNORMAL HIGH (ref 70–99)
Glucose-Capillary: 266 mg/dL — ABNORMAL HIGH (ref 70–99)

## 2024-01-23 LAB — HIV ANTIBODY (ROUTINE TESTING W REFLEX): HIV Screen 4th Generation wRfx: NONREACTIVE

## 2024-01-23 MED ORDER — ASPIRIN 81 MG PO CHEW
81.0000 mg | CHEWABLE_TABLET | Freq: Two times a day (BID) | ORAL | Status: DC
  Administered 2024-01-23 – 2024-01-27 (×8): 81 mg via ORAL
  Filled 2024-01-23 (×8): qty 1

## 2024-01-23 MED ORDER — MELATONIN 5 MG PO TABS
5.0000 mg | ORAL_TABLET | Freq: Once | ORAL | Status: AC
  Administered 2024-01-23: 5 mg via ORAL
  Filled 2024-01-23: qty 1

## 2024-01-23 MED ORDER — LIDOCAINE-PRILOCAINE 2.5-2.5 % EX CREA: 1.0000 | TOPICAL_CREAM | CUTANEOUS | Status: DC | PRN

## 2024-01-23 MED ORDER — TRAMADOL HCL 50 MG PO TABS
50.0000 mg | ORAL_TABLET | Freq: Two times a day (BID) | ORAL | Status: DC | PRN
  Administered 2024-01-23 – 2024-01-26 (×4): 50 mg via ORAL
  Filled 2024-01-23 (×4): qty 1

## 2024-01-23 MED ORDER — HEPARIN SODIUM (PORCINE) 1000 UNIT/ML DIALYSIS: 1000.0000 [IU] | INTRAMUSCULAR | Status: DC | PRN

## 2024-01-23 MED ORDER — ONDANSETRON 4 MG PO TBDP
ORAL_TABLET | ORAL | Status: AC
  Filled 2024-01-23: qty 1

## 2024-01-23 MED ORDER — PENTAFLUOROPROP-TETRAFLUOROETH EX AERO: 1.0000 | INHALATION_SPRAY | CUTANEOUS | Status: DC | PRN

## 2024-01-23 NOTE — Progress Notes (Signed)
 Hemodialysis Note:  Received patient in bed to unit. Alert and oriented. Informed consent singed and in chart.  Treatment initiated: 0820 Treatment completed: 1118  Access used: Left Fistula Access issues: None  Patient requested to end treatment 1 hour and 30 minutes early. HD NP notified. Patient signed AMA.  Transported back to room, alert without acute distress. Report given to patient's RN.  Total UF removed: 1.6 Liter Medications given: None  Post HD weight: 150.9 Kg  [facility]

## 2024-01-23 NOTE — Care Management Obs Status (Signed)
 MEDICARE OBSERVATION STATUS NOTIFICATION   Patient Details  Name: Rachel Holmes MRN: [id number] Date of Birth: [birth year]   Medicare Observation Status Notification Given:  Orland Dec, CMA 01/23/2024, 4:12 PM

## 2024-01-23 NOTE — Discharge Instructions (Addendum)
 Mark A. Dallas Schimke, MD MS [facility] [address] [place],  [place]  [address] Phone: [phone] Fax: [phone]   POST-OPERATIVE INSTRUCTIONS - LOWER EXTREMITY   WOUND CARE Please keep splint clean dry and intact until followup.  You may shower on Post-Op Day #2.  You must keep splint dry during this process and may find that a plastic bag taped around the leg or alternatively a towel based bath may be a better option.   If you get your splint wet or if it is damaged please contact our clinic.  EXERCISES Due to your splint being in place you will not be able to bear weight through your extremity.   DO NOT PUT ANY WEIGHT ON YOUR OPERATIVE LEG Please use crutches or a walker to avoid weight bearing.   REGIONAL ANESTHESIA (NERVE BLOCKS) The anesthesia team may have performed a nerve block for you if safe in the setting of your care.  This is a great tool used to minimize pain.  Typically the block may start wearing off overnight but the long acting medicine may last for 3-4 days.  The nerve block wearing off can be a challenging period but please utilize your as needed pain medications to try and manage this period.    POST-OP MEDICATIONS- Multimodal approach to pain control  In general your pain will be controlled with a combination of substances.  Prescriptions unless otherwise discussed are electronically sent to your pharmacy.  This is a carefully made plan we use to minimize narcotic use.     - Acetaminophen - Non-narcotic pain medicine taken on a scheduled basis   - Oxycodone - This is a strong narcotic, to be used only on an "as needed" basis for pain.  -  Aspirin 81mg  - This medicine is used to minimize the risk of blood clots after surgery.             -          Zofran - take as needed for nausea   FOLLOW-UP If you develop a Fever (>101.5), Redness or Drainage from the surgical incision site, please call our office to arrange for an  evaluation. Please call the office to schedule a follow-up appointment for your incision check if you do not already have one, 10-14 days post-operatively.  IF YOU HAVE ANY QUESTIONS, PLEASE FEEL FREE TO CALL OUR OFFICE.  HELPFUL INFORMATION  If you had a block, it will wear off between 8-24 hrs postop typically.  This is period when your pain may go from nearly zero to the pain you would have had postop without the block.  This is an abrupt transition but nothing dangerous is happening.  You may take an extra dose of narcotic when this happens.  You should wean off your narcotic medicines as soon as you are able.  Most patients will be off or using minimal narcotics before their first postop appointment.   Elevating your leg will help with swelling and pain control.  You are encouraged to elevate your leg as much as possible in the first couple of weeks following surgery.  Imagine a drop of water on your toe, and your goal is to get that water back to your heart.  We suggest you use the pain medication the first night prior to going to bed, in order to ease any pain when the anesthesia wears off. You should avoid taking pain medications on an empty stomach as it will  make you nauseous.  Do not drink alcoholic beverages or take illicit drugs when taking pain medications.  In most states it is against the law to drive while you are in a splint or sling.  And certainly against the law to drive while taking narcotics.  You may return to work/school in the next couple of days when you feel up to it.   Pain medication may make you constipated.  Below are a few solutions to try in this order: Decrease the amount of pain medication if you aren't having pain. Drink lots of decaffeinated fluids. Drink prune juice and/or each dried prunes  If the first 3 don't work start with additional solutions Take Colace - an over-the-counter stool softener Take Senokot - an over-the-counter laxative Take  Miralax - a stronger over-the-counter laxative  resume your outpatient dialysis Monday Wednesday Friday as before

## 2024-01-23 NOTE — Evaluation (Signed)
 Occupational Therapy Evaluation Patient Details Name: Rachel Holmes MRN: [id number] DOB: 1961-01-20 Today's Date: 01/23/2024   History of Present Illness   Pt is a 63 yo female s/p ORIF and osteotomy of R bimalleolar fx.  PMH of HTNm PVD, CHF, DMII, HD, L eye vision deficits, L BKA.     Clinical Impressions Chart revived, pt greeted in room with PT present, agreeable to OT evaluation. PTA pt reports she is generally MODI I with ADL including donning prosthetic however decreased ability to perform ADL with recent ankle fx. Pt squat pivots to mwc with prosthetic. Pt presents with deficits in strength, endurance, activity tolerance, balance affecting safe and optimal ADL completion. Pt is unable to squat pivot safety on this date with NWBing status in RLE. Bed mobility completed with CGA, MAX A to donn liner/prosthetic, MOD A +2 for lateral scoot transfer to bedside chair. SET UP for grooming/feeding tasks. Discussed discharge recommendations with pt. Pt will benefit form acute OT to address deficits and to facilitate optimal ADL performance. Pt is left in bedside chair, all needs met. Discussed with nursing for patient care lift transfer back to bed if needed. OT will follow acutely.     If plan is discharge home, recommend the following:   A lot of help with bathing/dressing/bathroom;A lot of help with walking and/or transfers;Assist for transportation;Help with stairs or ramp for entrance;Assistance with cooking/housework     Functional Status Assessment   Patient has had a recent decline in their functional status and demonstrates the ability to make significant improvements in function in a reasonable and predictable amount of time.     Equipment Recommendations   Other (comment) (defer to next venue of care)     Recommendations for Other Services         Precautions/Restrictions   Precautions Precautions: Fall Recall of Precautions/Restrictions:  Intact Restrictions Weight Bearing Restrictions Per Provider Order: Yes RLE Weight Bearing Per Provider Order: Non weight bearing Other Position/Activity Restrictions: L BKA     Mobility Bed Mobility Overal bed mobility: Needs Assistance Bed Mobility: Supine to Sit     Supine to sit: Contact guard, Used rails, HOB elevated          Transfers Overall transfer level: Needs assistance   Transfers: Bed to chair/wheelchair/BSC            Lateral/Scoot Transfers: Mod assist, +2 physical assistance General transfer comment: RLE with therapist foot underneath to ensure NWB; modA  1-2 with linen to assist with weight shift. Pt is unable to scoot towards R, only to L      Balance   Sitting-balance support: Feet supported Sitting balance-Leahy Scale: Good                                     ADL either performed or assessed with clinical judgement   ADL Overall ADL's : Needs assistance/impaired Eating/Feeding: Set up;Sitting   Grooming: Set up;Sitting       Lower Body Bathing: Maximal assistance       Lower Body Dressing: Maximal assistance;Sitting/lateral leans Lower Body Dressing Details (indicate cue type and reason): liner and prosthetic Toilet Transfer: Moderate assistance;+2 for physical assistance;+2 for safety/equipment Toilet Transfer Details (indicate cue type and reason): simulated to bedside recline Toileting- Clothing Manipulation and Hygiene: Maximal assistance Toileting - Clothing Manipulation Details (indicate cue type and reason): anticipate  Vision Patient Visual Report: No change from baseline       Perception         Praxis         Pertinent Vitals/Pain Pain Assessment Pain Assessment: Faces Faces Pain Scale: Hurts a little bit Pain Location: RLE in dependent position Pain Descriptors / Indicators: Sore, Grimacing, Guarding, Moaning Pain Intervention(s): Monitored during session, Limited activity within  patient's tolerance, Repositioned     Extremity/Trunk Assessment Upper Extremity Assessment Upper Extremity Assessment: LUE deficits/detail   Lower Extremity Assessment Lower Extremity Assessment: LLE deficits/detail LLE Deficits / Details: chronic L BKA       Communication Communication Communication: No apparent difficulties   Cognition Arousal: Alert Behavior During Therapy: WFL for tasks assessed/performed Cognition: No apparent impairments                               Following commands: Intact       Cueing  General Comments   Cueing Techniques: Verbal cues  vss throughout   Exercises Other Exercises Other Exercises: edu re: role of OT, role of rehab, discharge recommendations   Shoulder Instructions      Home Living Family/patient expects to be discharged to:: Private residence Living Arrangements: Children Available Help at Discharge: Family;Available PRN/intermittently Type of Home: Apartment Home Access: Stairs to enter Entrance Stairs-Number of Steps: 1   Home Layout: One level     Bathroom Shower/Tub: Chief Strategy Officer: Standard     Home Equipment: Wheelchair - manual          Prior Functioning/Environment Prior Level of Function : Independent/Modified Independent             Mobility Comments: mwc for MRADLs, squat pivot to toilet, mwc; ADLs Comments: generally MOD I-I with ADL/IADL, increased assist from recent ankle fx;    OT Problem List: Decreased strength;Decreased activity tolerance;Decreased knowledge of use of DME or AE;Decreased safety awareness;Impaired balance (sitting and/or standing)   OT Treatment/Interventions: Self-care/ADL training;DME and/or AE instruction;Therapeutic activities;Balance training;Therapeutic exercise;Energy conservation;Patient/family education      OT Goals(Current goals can be found in the care plan section)   Acute Rehab OT Goals Patient Stated Goal: rehab OT  Goal Formulation: With patient Time For Goal Achievement: 02/06/24 Potential to Achieve Goals: Good ADL Goals Pt Will Perform Grooming: with modified independence;sitting Pt Will Perform Lower Body Dressing: with modified independence;sitting/lateral leans Pt Will Transfer to Toilet: with contact guard assist;squat pivot transfer Pt Will Perform Toileting - Clothing Manipulation and hygiene: with contact guard assist;sitting/lateral leans   OT Frequency:  Min 2X/week    Co-evaluation PT/OT/SLP Co-Evaluation/Treatment: Yes Reason for Co-Treatment: To address functional/ADL transfers PT goals addressed during session: Mobility/safety with mobility;Balance OT goals addressed during session: ADL's and self-care;Proper use of Adaptive equipment and DME      AM-PAC OT "6 Clicks" Daily Activity     Outcome Measure Help from another person eating meals?: None Help from another person taking care of personal grooming?: None Help from another person toileting, which includes using toliet, bedpan, or urinal?: A Lot Help from another person bathing (including washing, rinsing, drying)?: A Lot Help from another person to put on and taking off regular upper body clothing?: A Little Help from another person to put on and taking off regular lower body clothing?: A Lot 6 Click Score: 17   End of Session Equipment Utilized During Treatment: Other (comment) (prosthetic) Nurse Communication: Mobility status  Activity Tolerance: Patient tolerated treatment well Patient left: in chair;with call bell/phone within reach  OT Visit Diagnosis: Other abnormalities of gait and mobility (R26.89);Muscle weakness (generalized) (M62.81);Unsteadiness on feet (R26.81)                Time: 1610-9604 OT Time Calculation (min): 26 min Charges:  OT General Charges $OT Visit: 1 Visit OT Evaluation $OT Eval Moderate Complexity: 1 Mod  Oleta Mouse, OTD OTR/L  01/23/24, 4:18 PM

## 2024-01-23 NOTE — Plan of Care (Signed)
  Problem: Skin Integrity: Goal: Risk for impaired skin integrity will decrease Outcome: Progressing   

## 2024-01-23 NOTE — Evaluation (Signed)
 Physical Therapy Evaluation Patient Details Name: Rachel Holmes MRN: [id number] DOB: 1961-06-20 Today's Date: 01/23/2024  History of Present Illness  Pt is a 63 yo female s/p ORIF and osteotomy of R bimalleolar fx.  PMH of HTNm PVD, CHF, DMII, HD, L eye vision deficits, L BKA.  Clinical Impression  Patient alert, agreeable to PT, very motivated to be out of bed. At baseline she is able to squat pivot to [facility], lives alone, modI for ADLs. Uses public transportation for HD days.  She was able to perform bed mobility with CGA, reliance on bed rails, extra time. Sitting balance fair-good, but maxA needed to don L prosthetic. Unable to scoot laterally to her R, modAx2 to scoot laterally into the recliner via L. Therapist foot under RLE to ensure NWB status.  Overall the patient demonstrated deficits (see "PT Problem List") that impede the patient's functional abilities, safety, and mobility and would benefit from skilled PT intervention.          If plan is discharge home, recommend the following: Two people to help with walking and/or transfers;Two people to help with bathing/dressing/bathroom;Help with stairs or ramp for entrance;Assistance with feeding;Assist for transportation;Assistance with cooking/housework   Can travel by private vehicle   No    Equipment Recommendations Other (comment) (TBD at next venue of care)  Recommendations for Other Services       Functional Status Assessment Patient has had a recent decline in their functional status and demonstrates the ability to make significant improvements in function in a reasonable and predictable amount of time.     Precautions / Restrictions Precautions Precautions: Fall Recall of Precautions/Restrictions: Intact Restrictions Weight Bearing Restrictions Per Provider Order: Yes RLE Weight Bearing Per Provider Order: Non weight bearing Other Position/Activity Restrictions: L BKA      Mobility  Bed Mobility Overal bed mobility:  Needs Assistance Bed Mobility: Supine to Sit     Supine to sit: Contact guard, Used rails, HOB elevated          Transfers Overall transfer level: Needs assistance   Transfers: Bed to chair/wheelchair/BSC            Lateral/Scoot Transfers: Mod assist, +2 physical assistance General transfer comment: RLE with therapist foot underneath to ensure NWB; modA  1-2 with linen to assist with weight shift. pt unable to scoot towards R, only to L    Ambulation/Gait                  Stairs            Wheelchair Mobility     Tilt Bed    Modified Rankin (Stroke Patients Only)       Balance Overall balance assessment: Needs assistance Sitting-balance support: Feet supported   Sitting balance - Comments: L BKA donned                                     Pertinent Vitals/Pain Pain Assessment Pain Assessment: Faces Pain Location: RLE in dependent position Pain Descriptors / Indicators: Sore, Grimacing, Guarding, Moaning Pain Intervention(s): Limited activity within patient's tolerance, Monitored during session, Repositioned, RN gave pain meds during session    Home Living Family/patient expects to be discharged to:: Private residence Living Arrangements: Children Available Help at Discharge: Family;Available PRN/intermittently Type of Home: Apartment Home Access: Stairs to enter   Entrance Stairs-Number of Steps: 1   Home Layout: One level  Home Equipment: Wheelchair - manual      Prior Function               Mobility Comments: pt able to squat pivot to WC, to toilet ADLs Comments: modI     Extremity/Trunk Assessment   Upper Extremity Assessment Upper Extremity Assessment: Generalized weakness    Lower Extremity Assessment Lower Extremity Assessment:  (chronic L BKA, NWB RLE; able to lift both from bed against gravity)       Communication        Cognition Arousal: Alert Behavior During Therapy: WFL for tasks  assessed/performed   PT - Cognitive impairments: No apparent impairments                                 Cueing       General Comments      Exercises     Assessment/Plan    PT Assessment Patient needs continued PT services  PT Problem List Decreased strength;Pain;Decreased range of motion;Decreased activity tolerance;Decreased balance;Decreased mobility;Decreased knowledge of precautions;Decreased knowledge of use of DME       PT Treatment Interventions DME instruction;Balance training;Gait training;Neuromuscular re-education;Stair training;Functional mobility training;Patient/family education;Therapeutic activities;Therapeutic exercise    PT Goals (Current goals can be found in the Care Plan section)  Acute Rehab PT Goals Patient Stated Goal: to return to PLOF as able PT Goal Formulation: With patient Time For Goal Achievement: 02/06/24 Potential to Achieve Goals: Good    Frequency Min 2X/week     Co-evaluation PT/OT/SLP Co-Evaluation/Treatment: Yes Reason for Co-Treatment: To address functional/ADL transfers PT goals addressed during session: Mobility/safety with mobility;Balance OT goals addressed during session: ADL's and self-care;Proper use of Adaptive equipment and DME       AM-PAC PT "6 Clicks" Mobility  Outcome Measure Help needed turning from your back to your side while in a flat bed without using bedrails?: A Lot Help needed moving from lying on your back to sitting on the side of a flat bed without using bedrails?: A Lot Help needed moving to and from a bed to a chair (including a wheelchair)?: A Lot Help needed standing up from a chair using your arms (e.g., wheelchair or bedside chair)?: Total Help needed to walk in hospital room?: Total Help needed climbing 3-5 steps with a railing? : Total 6 Click Score: 9    End of Session   Activity Tolerance: Patient tolerated treatment well Patient left: in chair;with call bell/phone within  reach Nurse Communication: Mobility status PT Visit Diagnosis: Other abnormalities of gait and mobility (R26.89);Difficulty in walking, not elsewhere classified (R26.2);Muscle weakness (generalized) (M62.81);Pain Pain - Right/Left: Right Pain - part of body: Ankle and joints of foot    Time: 1352-1426 PT Time Calculation (min) (ACUTE ONLY): 34 min   Charges:   PT Evaluation $PT Eval Low Complexity: 1 Low PT Treatments $Therapeutic Activity: 8-22 mins PT General Charges $$ ACUTE PT VISIT: 1 Visit        Olga Coaster PT, DPT 3:48 PM,01/23/24

## 2024-01-23 NOTE — Progress Notes (Signed)
 [facility]  ROUNDING NOTE   Subjective:   Rachel Holmes is a 63 y.o. female with past medical conditions including anemia, anxiety, CHF, diabetes, diabetic nephropathy, GERD, hyperlipidemia, legally blind in left eye, and end-stage renal disease on hemodialysis.  Patient arrived to hospital for scheduled operative procedure on the right ankle.  Patient requested overnight stay in hospital to receive PT evaluation and possible help at home.  Patient has been admitted under observation for Ankle fracture [S82.899A]  Patient is known to our practice and receives outpatient dialysis treatments at [facility] on a Monday Wednesday Friday schedule, supervised by Dr. Thedore Mins.  Last treatment received on Wednesday.  Patient is seen and evaluated during dialysis.   HEMODIALYSIS FLOWSHEET:  Blood Flow Rate (mL/min): 350 mL/min Arterial Pressure (mmHg): -180 mmHg Venous Pressure (mmHg): 25 mmHg TMP (mmHg): 20 mmHg Ultrafiltration Rate (mL/min): 1420 mL/min Dialysate Flow Rate (mL/min): 300 ml/min Dialysis Fluid Bolus: Normal Saline  Denies any postoperative pain at this time.  No lower extremity edema.  Remains on room air.  Labs unremarkable for renal patient.  We were consulted to maintain dialysis needs during this admission.   Objective:  Vital signs in last 24 hours:  Temp:  [96.8 F (36 C)-98.2 F (36.8 C)] 97.7 F (36.5 C) (03/21 1118) Pulse Rate:  [57-77] 66 (03/21 1122) Resp:  [9-18] 16 (03/21 1122) BP: (97-164)/(40-82) 116/76 (03/21 1122) SpO2:  [88 %-100 %] 99 % (03/21 1122) Weight:  [152.5 kg] 152.5 kg (03/21 0802)  Weight change:  Filed Weights   01/23/24 0802  Weight: (!) 152.5 kg    Intake/Output: I/O last 3 completed shifts: In: 350 [I.V.:200; IV Piggyback:150] Out: 150 [Blood:150]   Intake/Output this shift:  Total I/O In: -  Out: 1600 [Other:1600]  Physical Exam: General: NAD, resting comfortably  Head: Normocephalic,  atraumatic. Moist oral mucosal membranes  Eyes: Anicteric  Lungs:  Clear to auscultation, normal effort  Heart: Regular rate and rhythm  Abdomen:  Soft, nontender,   Extremities: No peripheral edema.  Left AKA  Neurologic: Alert, moving all four extremities  Skin: No lesions  Access: Left aVF    Basic Metabolic Panel: Recent Labs  Lab 01/20/24 1432 01/22/24 1147 01/23/24 0844  NA 138 136 134*  K 4.1 4.0 3.9  CL 102 99 98  CO2 24  --  25  GLUCOSE 96 228* 185*  BUN 38* 33* 43*  CREATININE 4.38* 5.00* 4.76*  CALCIUM 8.6*  --  8.0*  PHOS  --   --  4.6    Liver Function Tests: Recent Labs  Lab 01/23/24 0844  ALBUMIN 2.9*   No results for input(s): "LIPASE", "AMYLASE" in the last 168 hours. No results for input(s): "AMMONIA" in the last 168 hours.  CBC: Recent Labs  Lab 01/20/24 1432 01/22/24 1147 01/22/24 1706 01/23/24 0844  WBC 7.2  --  5.2 7.3  HGB 10.3* 10.9* 10.6* 9.1*  HCT 32.7* 32.0* 32.9* 29.8*  MCV 94.2  --  92.4 95.5  PLT 229  --  207 184    Cardiac Enzymes: No results for input(s): "CKTOTAL", "CKMB", "CKMBINDEX", "TROPONINI" in the last 168 hours.  BNP: Invalid input(s): "POCBNP"  CBG: Recent Labs  Lab 01/22/24 1537 01/22/24 1838 01/22/24 2131  GLUCAP 157* 166* 196*    Microbiology: Results for orders placed or performed during the hospital encounter of 05/28/19  SARS Coronavirus 2 (CEPHEID - Performed in [facility] hospital lab), Hosp Order     Status:  None   Collection Time: 05/29/19 12:15 AM   Specimen: Nasopharyngeal Swab  Result Value Ref Range Status   SARS Coronavirus 2 NEGATIVE NEGATIVE Final    Comment: (NOTE) If result is NEGATIVE SARS-CoV-2 target nucleic acids are NOT DETECTED. The SARS-CoV-2 RNA is generally detectable in upper and lower  respiratory specimens during the acute phase of infection. The lowest  concentration of SARS-CoV-2 viral copies this assay can detect is 250  copies / mL. A negative result does not  preclude SARS-CoV-2 infection  and should not be used as the sole basis for treatment or other  patient management decisions.  A negative result may occur with  improper specimen collection / handling, submission of specimen other  than nasopharyngeal swab, presence of viral mutation(s) within the  areas targeted by this assay, and inadequate number of viral copies  (<250 copies / mL). A negative result must be combined with clinical  observations, patient history, and epidemiological information. If result is POSITIVE SARS-CoV-2 target nucleic acids are DETECTED. The SARS-CoV-2 RNA is generally detectable in upper and lower  respiratory specimens dur ing the acute phase of infection.  Positive  results are indicative of active infection with SARS-CoV-2.  Clinical  correlation with patient history and other diagnostic information is  necessary to determine patient infection status.  Positive results do  not rule out bacterial infection or co-infection with other viruses. If result is PRESUMPTIVE POSTIVE SARS-CoV-2 nucleic acids MAY BE PRESENT.   A presumptive positive result was obtained on the submitted specimen  and confirmed on repeat testing.  While 2019 novel coronavirus  (SARS-CoV-2) nucleic acids may be present in the submitted sample  additional confirmatory testing may be necessary for epidemiological  and / or clinical management purposes  to differentiate between  SARS-CoV-2 and other Sarbecovirus currently known to infect humans.  If clinically indicated additional testing with an alternate test  methodology [phone]) is advised. The SARS-CoV-2 RNA is generally  detectable in upper and lower respiratory sp ecimens during the acute  phase of infection. The expected result is Negative. Fact Sheet for Patients:  BoilerBrush.com.cy Fact Sheet for Healthcare Providers: https://pope.com/ This test is not yet approved or cleared by  the [place] FDA and has been authorized for detection and/or diagnosis of SARS-CoV-2 by FDA under an Emergency Use Authorization (EUA).  This EUA will remain in effect (meaning this test can be used) for the duration of the COVID-19 declaration under Section 564(b)(1) of the Act, 21 U.S.C. section 360bbb-3(b)(1), unless the authorization is terminated or revoked sooner. Performed at [facility], [address] [place], [place] [address]   MRSA PCR Screening     Status: None   Collection Time: 05/29/19  4:44 AM   Specimen: Nasal Mucosa; Nasopharyngeal  Result Value Ref Range Status   MRSA by PCR NEGATIVE NEGATIVE Final    Comment:        The GeneXpert MRSA Assay (FDA approved for NASAL specimens only), is one component of a comprehensive MRSA colonization surveillance program. It is not intended to diagnose MRSA infection nor to guide or monitor treatment for MRSA infections. Performed at [facility], [address] [place], [place] [address]     Coagulation Studies: No results for input(s): "LABPROT", "INR" in the last 72 hours.  Urinalysis: No results for input(s): "COLORURINE", "LABSPEC", "PHURINE", "GLUCOSEU", "HGBUR", "BILIRUBINUR", "KETONESUR", "PROTEINUR", "UROBILINOGEN", "NITRITE", "LEUKOCYTESUR" in the last 72 hours.  Invalid input(s): "APPERANCEUR"    Imaging: DG Ankle 2  Views Right Result Date: 01/22/2024 CLINICAL DATA:  Elective surgery. EXAM: RIGHT ANKLE - 2 VIEW COMPARISON:  Preoperative imaging FINDINGS: Three fluoroscopic spot views of the ankle submitted from the operating room. Lateral plate and screw fixation of distal fibular fracture with 2 syndesmotic screws. Two screws traverse the medial malleolar fracture. Fluoroscopy time 1 minute 12 seconds. Dose 1.8754 mGy. IMPRESSION: Intraoperative fluoroscopy during ankle fracture fixation. Electronically Signed   By: Narda Rutherford M.D.   On: 01/22/2024 15:08   DG C-Arm 1-60  Min-No Report Result Date: 01/22/2024 Fluoroscopy was utilized by the requesting physician.  No radiographic interpretation.   DG C-Arm 1-60 Min-No Report Result Date: 01/22/2024 Fluoroscopy was utilized by the requesting physician.  No radiographic interpretation.   [name] OR NERVE BLOCK-IMAGE ONLY [facility]) Result Date: 01/22/2024 There is no interpretation for this exam.  This order is for images obtained during a surgical procedure.  Please See "Surgeries" Tab for more information regarding the procedure.     Medications:     Chlorhexidine Gluconate Cloth  6 each Topical Q0600   cyanocobalamin  1,000 mcg Oral Daily   docusate sodium  100 mg Oral BID   doxazosin  8 mg Oral Daily   DULoxetine  30 mg Oral Daily   ezetimibe  10 mg Oral Daily   fentaNYL (SUBLIMAZE) injection  50 mcg Intravenous Once   gabapentin  600 mg Oral QHS   insulin aspart  0-5 Units Subcutaneous QHS   insulin aspart  0-9 Units Subcutaneous TID WC   insulin glargine-yfgn  11 Units Subcutaneous QHS   irbesartan  37.5 mg Oral Daily   isosorbide mononitrate  120 mg Oral BID   magnesium oxide  400 mg Oral Q0600   multivitamin with minerals  1 tablet Oral Daily   pantoprazole  40 mg Oral Daily   acetaminophen **OR** acetaminophen, baclofen, bisacodyl, heparin, lidocaine-prilocaine, ondansetron, oxyCODONE, pentafluoroprop-tetrafluoroeth  Assessment/ Plan:  Rachel Holmes is a 63 y.o.  female with past medical conditions including anemia, anxiety, CHF, diabetes, diabetic nephropathy, GERD, hyperlipidemia, legally blind in left eye, and end-stage renal disease on hemodialysis.  Patient arrived to hospital for scheduled operative procedure on the right ankle.  Patient requested overnight stay in hospital to receive PT evaluation and possible help at home.  Patient has been admitted under observation for Ankle fracture [S82.899A]  [facility]/MWF/left aVF/146.0 kg  Anemia of chronic kidney  disease Lab Results  Component Value Date   HGB 9.1 (L) 01/23/2024  Hemoglobin just within desired range.  Patient currently postop.  Does receive Mircera at outpatient clinic.  2.  End-stage renal disease on hemodialysis.  Last treatment received on Wednesday.  Patient receiving scheduled dialysis today, requested UF increased to 3.5L.  Blood pressure acceptable, will increase to 3.5 L and monitor tolerance.  Next treatment scheduled for Monday.  3. Diabetes mellitus type II with chronic kidney disease/renal manifestations: insulin dependent. Home regimen includes Mounjaro and lispro. Most recent hemoglobin A1c is 7.3 on 01/22/24.   4. Secondary Hyperparathyroidism: with outpatient labs: PTH 290, phosphorus 5.0, calcium 8.4 on 01/12/24.    Lab Results  Component Value Date   CALCIUM 8.0 (L) 01/23/2024   CAION 1.05 (L) 01/22/2024   PHOS 4.6 01/23/2024    Currently prescribed calcitriol outpatient.   LOS: 0 Dhyana Bastone 3/21/202511:24 AM

## 2024-01-23 NOTE — Progress Notes (Signed)
 OT Cancellation Note  Patient Details Name: Rachel Holmes MRN: [id number] DOB: 1961/04/06   Cancelled Treatment:    Reason Eval/Treat Not Completed: Other (comment) (pt is OTF for HD; OT will re attempt as able.Oleta Mouse, OTD OTR/L  01/23/24, 8:45 AM

## 2024-01-23 NOTE — Progress Notes (Signed)
   ORTHOPAEDIC PROGRESS NOTE  s/p Procedure(s): ORIF of right ankle fracture malunion  DOS: 01/22/2024  SUBJECTIVE: Patient is complaining of pain in the right ankle.  No issues overnight.  She is in dialysis this morning.  She has concerns about going home, and prefers to go to rehab.  OBJECTIVE: PE:  Alert and oriented.  No acute distress.  Splint on the right ankle is clean, dry and intact She has sensation intact to the exposed toes. She is able to wiggle her exposed toes No skin breakdown at the periphery of the splint  Vitals:   01/23/24 1118 01/23/24 1122  BP: (!) 126/51 116/76  Pulse: 66 66  Resp: 16 16  Temp: 97.7 F (36.5 C)   SpO2: 99% 99%      Latest Ref Rng & Units 01/23/2024    8:44 AM 01/22/2024    5:06 PM 01/22/2024   11:47 AM  CBC  WBC 4.0 - 10.5 K/uL 7.3  5.2    Hemoglobin 12.0 - 15.0 g/dL 9.1  16.1  09.6   Hematocrit 36.0 - 46.0 % 29.8  32.9  32.0   Platelets 150 - 400 K/uL 184  207       ASSESSMENT: Rachel Holmes is a 63 y.o. female stable postop  PLAN: Weightbearing: NWB RLE Incisional and dressing care: Dressings left intact until follow-up Orthopedic device(s): Splint on right ankle VTE prophylaxis: Aspirin 81mg  BID  weeks Pain control: As needed; judicious use of narcotics..  Pain medications have been prescribed Follow - up plan: 2-3 weeks  Please schedule a follow up in 3 weeks at the [place] clinic.  Please call [phone] and specifically request an appointment in [place].  If there are any issues, I can be contacted at the office in [place] [phone]    Contact information:     Claude Waldman A. Dallas Schimke, MD MS [facility] [address] [place],  [place]  [address] Phone: [phone] Fax: [phone]

## 2024-01-23 NOTE — Progress Notes (Signed)
 [facility]  - [address] at [facility]   PATIENT NAME: Rachel Holmes    MR#:  [id number]  DATE OF BIRTH:  August 28, 1961  SUBJECTIVE:  patient was seen at dialysis earlier. Tolerating it well. Vitals stable. Did have some pain in the operative leg. Wants to speak with caseworker regarding discharge planning. Lives at home with son. Uses wheelchair and prosthetic leg along with walker to hop short distance    VITALS:  Blood pressure (!) 153/66, pulse 72, temperature 98.2 F (36.8 C), temperature source Oral, resp. rate 18, weight (!) 150.9 kg, SpO2 96%.  PHYSICAL EXAMINATION:   GENERAL:  63 y.o.-year-old patient with no acute distress. Morbid obesity LUNGS: Normal breath sounds bilaterally, no wheezing CARDIOVASCULAR: S1, S2 normal. No murmur   ABDOMEN: Soft, nontender, nondistended. Bowel sounds present.  EXTREMITIES left amputation stump, right ankle splint   HD access NEUROLOGIC: nonfocal  patient is alert and awake   LABORATORY PANEL:  CBC Recent Labs  Lab 01/23/24 0844  WBC 7.3  HGB 9.1*  HCT 29.8*  PLT 184    Chemistries  Recent Labs  Lab 01/23/24 0844  NA 134*  K 3.9  CL 98  CO2 25  GLUCOSE 185*  BUN 43*  CREATININE 4.76*  CALCIUM 8.0*   Cardiac Enzymes No results for input(s): "TROPONINI" in the last 168 hours. RADIOLOGY:  DG Ankle 2 Views Right Result Date: 01/22/2024 CLINICAL DATA:  Elective surgery. EXAM: RIGHT ANKLE - 2 VIEW COMPARISON:  Preoperative imaging FINDINGS: Three fluoroscopic spot views of the ankle submitted from the operating room. Lateral plate and screw fixation of distal fibular fracture with 2 syndesmotic screws. Two screws traverse the medial malleolar fracture. Fluoroscopy time 1 minute 12 seconds. Dose 1.8754 mGy. IMPRESSION: Intraoperative fluoroscopy during ankle fracture fixation. Electronically Signed   By: Narda Rutherford M.D.   On: 01/22/2024 15:08   DG C-Arm 1-60 Min-No Report Result Date:  01/22/2024 Fluoroscopy was utilized by the requesting physician.  No radiographic interpretation.   DG C-Arm 1-60 Min-No Report Result Date: 01/22/2024 Fluoroscopy was utilized by the requesting physician.  No radiographic interpretation.   [name] OR NERVE BLOCK-IMAGE ONLY [facility]) Result Date: 01/22/2024 There is no interpretation for this exam.  This order is for images obtained during a surgical procedure.  Please See "Surgeries" Tab for more information regarding the procedure.   Assessment and Plan   ROXANA LAI is a 63 y.o. female with medical history significant of end-stage renal disease on hemodialysis Monday Wednesday Friday, arthritis, history of congestive heart failure diastolic, type II diabetes on insulin, diabetic nephropathy, hyperlipidemia, is being admitted after undergoing right Bimalleolar ankle fracture surgery due to ongoing pain and malunion. Patient had sustained fracture in early part of January and had a boot placed.   Status post right Bimalleolar ankle fracture surgery due to ongoing pain and malunion.--Elective surgery -- Postop orders for orthopedic surgery -- DVT prophylaxis per ortho surgery -- PT OT evaluation noted. Recommends rehab [facility] for discharge planning   End-stage renal disease on hemodialysis Monday Wednesday Friday -- consultation with Dr. Cherylann Ratel for routine dialysis   Type II diabetes, insulin requiring, uncontrolled, neuropathy -- will resume home dose of insulin including sliding scale -- A1c 7.3-- last A1c October 2024 was 7.9 -- continue gabapentin   Hypertension -- resume Cardura, imdur, olmesartan  for now   Hyperlipidemia -- patient is on Zetia  TOC for discharge planning  Procedures: Family communication : none Consults : ortho CODE STATUS:  full DVT Prophylaxis : will defer to orthopedic surgery Level of care: Med-Surg Status is: Observation The patient remains OBS appropriate and will d/c before 2 midnights.    TOTAL TIME  TAKING CARE OF THIS PATIENT: 45 minutes.  >50% time spent on counselling and coordination of care  Note: This dictation was prepared with Dragon dictation along with smaller phrase technology. Any transcriptional errors that result from this process are unintentional.  Enedina Finner M.D    [facility]   CC: Primary care physician; Rayetta Humphrey, MD

## 2024-01-23 NOTE — Progress Notes (Signed)
       CROSS COVER NOTE  NAME: Rachel Holmes MRN: [id number] DOB : 1961-03-01 ATTENDING PHYSICIAN: Enedina Finner, MD    Date of Service   01/23/2024   HPI/Events of Note   Message received from secure chat from nurse Hello, this patient is here for an ankle fracture that was operated on yesterday. She has oxycodone and tylenol for pain. She does not like the feeling she gets from oxycodone, she uses tramadol at home and was wondering if she could get that instead of oxycodone. She would also like something to help her sleep.   Interventions   Assessment/Plan: Oxycodone discontinued, tramadol 50 mg every 12h prn seere pain Melatonin 5 mg x 1 dose for pain        Donnie Mesa NP [facility] Cross Cover 7pm-7am - check amion for availability Pager [phone]

## 2024-01-24 DIAGNOSIS — N186 End stage renal disease: Secondary | ICD-10-CM | POA: Diagnosis not present

## 2024-01-24 DIAGNOSIS — M14672 Charcot's joint, left ankle and foot: Secondary | ICD-10-CM | POA: Diagnosis not present

## 2024-01-24 DIAGNOSIS — S82891P Other fracture of right lower leg, subsequent encounter for closed fracture with malunion: Secondary | ICD-10-CM | POA: Diagnosis not present

## 2024-01-24 DIAGNOSIS — E119 Type 2 diabetes mellitus without complications: Secondary | ICD-10-CM | POA: Diagnosis not present

## 2024-01-24 DIAGNOSIS — S82841P Displaced bimalleolar fracture of right lower leg, subsequent encounter for closed fracture with malunion: Secondary | ICD-10-CM | POA: Diagnosis not present

## 2024-01-24 LAB — GLUCOSE, CAPILLARY
Glucose-Capillary: 198 mg/dL — ABNORMAL HIGH (ref 70–99)
Glucose-Capillary: 214 mg/dL — ABNORMAL HIGH (ref 70–99)
Glucose-Capillary: 180 mg/dL — ABNORMAL HIGH (ref 70–99)
Glucose-Capillary: 241 mg/dL — ABNORMAL HIGH (ref 70–99)

## 2024-01-24 LAB — HEPATITIS B SURFACE ANTIBODY, QUANTITATIVE: Hep B S AB Quant (Post): 69.7 m[IU]/mL

## 2024-01-24 MED ORDER — INSULIN GLARGINE-YFGN 100 UNIT/ML ~~LOC~~ SOLN
14.0000 [IU] | Freq: Every day | SUBCUTANEOUS | Status: DC
  Administered 2024-01-24: 14 [IU] via SUBCUTANEOUS
  Filled 2024-01-24: qty 0.14

## 2024-01-24 NOTE — Progress Notes (Signed)
 [facility]  PROGRESS NOTE   Subjective:   Patient seen at bedside.  Feels much better.  Objective:  Vital signs: Blood pressure (!) 130/47, pulse 75, temperature 98 F (36.7 C), resp. rate 16, weight (!) 150.9 kg, SpO2 92%.  Intake/Output Summary (Last 24 hours) at 01/24/2024 1406 Last data filed at 01/24/2024 0900 Gross per 24 hour  Intake 240 ml  Output 200 ml  Net 40 ml   Filed Weights   01/23/24 0802 01/23/24 1120  Weight: (!) 152.5 kg (!) 150.9 kg     Physical Exam: General:  No acute distress  Head:  Normocephalic, atraumatic. Moist oral mucosal membranes  Eyes:  Anicteric  Neck:  Supple  Lungs:   Clear to auscultation, normal effort  Heart:  S1S2 no rubs  Abdomen:   Soft, nontender, bowel sounds present  Extremities:  peripheral edema.  Neurologic:  Awake, alert, following commands  Skin:  No lesions  Access:     Basic Metabolic Panel: Recent Labs  Lab 01/20/24 1432 01/22/24 1147 01/23/24 0844  NA 138 136 134*  K 4.1 4.0 3.9  CL 102 99 98  CO2 24  --  25  GLUCOSE 96 228* 185*  BUN 38* 33* 43*  CREATININE 4.38* 5.00* 4.76*  CALCIUM 8.6*  --  8.0*  PHOS  --   --  4.6   GFR: Estimated Creatinine Clearance: 19.1 mL/min (A) (by C-G formula based on SCr of 4.76 mg/dL (H)).  Liver Function Tests: Recent Labs  Lab 01/23/24 0844  ALBUMIN 2.9*   No results for input(s): "LIPASE", "AMYLASE" in the last 168 hours. No results for input(s): "AMMONIA" in the last 168 hours.  CBC: Recent Labs  Lab 01/20/24 1432 01/22/24 1147 01/22/24 1706 01/23/24 0844  WBC 7.2  --  5.2 7.3  HGB 10.3* 10.9* 10.6* 9.1*  HCT 32.7* 32.0* 32.9* 29.8*  MCV 94.2  --  92.4 95.5  PLT 229  --  207 184     HbA1C: Hemoglobin A1C  Date/Time Value Ref Range Status  07/13/2020 01:15 PM 7.5 (A) 4.0 - 5.6 % Final  05/04/2020 01:44 PM 7.7 (A) 4.0 - 5.6 % Final  01/22/2019 12:00 AM 8.4  Final  11/20/2013 09:54 AM 7.7 (H) 4.2 - 6.3 % Final    Comment:    The  American Diabetes Association recommends that a primary goal of therapy should be <7% and that physicians should reevaluate the treatment regimen in patients with HbA1c values consistently >8%.    Hgb A1c MFr Bld  Date/Time Value Ref Range Status  01/22/2024 05:06 PM 7.3 (H) 4.8 - 5.6 % Final    Comment:    (NOTE) Pre diabetes:          5.7%-6.4%  Diabetes:              >6.4%  Glycemic control for   <7.0% adults with diabetes   05/06/2019 03:30 AM 7.3 (H) 4.8 - 5.6 % Final    Comment:    (NOTE) Pre diabetes:          5.7%-6.4% Diabetes:              >6.4% Glycemic control for   <7.0% adults with diabetes     Urinalysis: No results for input(s): "COLORURINE", "LABSPEC", "PHURINE", "GLUCOSEU", "HGBUR", "BILIRUBINUR", "KETONESUR", "PROTEINUR", "UROBILINOGEN", "NITRITE", "LEUKOCYTESUR" in the last 72 hours.  Invalid input(s): "APPERANCEUR"    Imaging: DG Ankle 2 Views Right Result Date: 01/22/2024 CLINICAL DATA:  Elective  surgery. EXAM: RIGHT ANKLE - 2 VIEW COMPARISON:  Preoperative imaging FINDINGS: Three fluoroscopic spot views of the ankle submitted from the operating room. Lateral plate and screw fixation of distal fibular fracture with 2 syndesmotic screws. Two screws traverse the medial malleolar fracture. Fluoroscopy time 1 minute 12 seconds. Dose 1.8754 mGy. IMPRESSION: Intraoperative fluoroscopy during ankle fracture fixation. Electronically Signed   By: Narda Rutherford M.D.   On: 01/22/2024 15:08   DG C-Arm 1-60 Min-No Report Result Date: 01/22/2024 Fluoroscopy was utilized by the requesting physician.  No radiographic interpretation.   DG C-Arm 1-60 Min-No Report Result Date: 01/22/2024 Fluoroscopy was utilized by the requesting physician.  No radiographic interpretation.     Medications:     aspirin  81 mg Oral BID   Chlorhexidine Gluconate Cloth  6 each Topical Q0600   cyanocobalamin  1,000 mcg Oral Daily   docusate sodium  100 mg Oral BID   doxazosin  8  mg Oral Daily   DULoxetine  30 mg Oral Daily   ezetimibe  10 mg Oral Daily   fentaNYL (SUBLIMAZE) injection  50 mcg Intravenous Once   gabapentin  600 mg Oral QHS   insulin aspart  0-5 Units Subcutaneous QHS   insulin aspart  0-9 Units Subcutaneous TID WC   insulin glargine-yfgn  14 Units Subcutaneous QHS   irbesartan  37.5 mg Oral Daily   isosorbide mononitrate  120 mg Oral BID   magnesium oxide  400 mg Oral Q0600   multivitamin with minerals  1 tablet Oral Daily   pantoprazole  40 mg Oral Daily    Assessment/ Plan:      63 y.o.  female with past medical conditions including anemia, anxiety, CHF, diabetes, diabetic nephropathy, GERD, hyperlipidemia, legally blind in left eye, and end-stage renal disease on hemodialysis.  Patient arrived to hospital for scheduled operative procedure on the right ankle.  Patient requested overnight stay in hospital to receive PT evaluation and possible help at home.  Patient has been admitted under observation for Ankle fracture [S82.899A]   #1: ESRD: Patient had stable dialysis yesterday.  Tolerated fluid removal.  Next dialysis will be on Monday.  #2 chronic anemia: Continue anemia protocol.  #3: Secondary hyperparathyroidism: Will continue to monitor PTH, calcium and phosphorus levels.  #4: Hypertension: Continue irbesartan.  #5: Diabetes: Continue insulin as ordered.   Labs and medications reviewed. Will continue to follow along with you.   LOS: 0 Lorain Childes, MD [facility] 3/22/20252:06 PM

## 2024-01-24 NOTE — Progress Notes (Addendum)
 [facility]  - [place] at [facility]   PATIENT NAME: Rachel Holmes    MR#:  [id number]  DATE OF BIRTH:  18-Feb-1961  SUBJECTIVE:  no new complaints. Work with PT yesterday. No family at bedside  VITALS:  Blood pressure (!) 130/47, pulse 75, temperature 98 F (36.7 C), resp. rate 16, weight (!) 150.9 kg, SpO2 92%.  PHYSICAL EXAMINATION:   GENERAL:  63 y.o.-year-old patient with no acute distress. Morbid obesity LUNGS: Normal breath sounds bilaterally, no wheezing CARDIOVASCULAR: S1, S2 normal. No murmur   ABDOMEN: Soft, nontender, nondistended. Bowel sounds present.  EXTREMITIES left amputation stump, right ankle splint   HD access NEUROLOGIC: nonfocal  patient is alert and awake   LABORATORY PANEL:  CBC Recent Labs  Lab 01/23/24 0844  WBC 7.3  HGB 9.1*  HCT 29.8*  PLT 184    Chemistries  Recent Labs  Lab 01/23/24 0844  NA 134*  K 3.9  CL 98  CO2 25  GLUCOSE 185*  BUN 43*  CREATININE 4.76*  CALCIUM 8.0*    RADIOLOGY:  DG Ankle 2 Views Right Result Date: 01/22/2024 CLINICAL DATA:  Elective surgery. EXAM: RIGHT ANKLE - 2 VIEW COMPARISON:  Preoperative imaging FINDINGS: Three fluoroscopic spot views of the ankle submitted from the operating room. Lateral plate and screw fixation of distal fibular fracture with 2 syndesmotic screws. Two screws traverse the medial malleolar fracture. Fluoroscopy time 1 minute 12 seconds. Dose 1.8754 mGy. IMPRESSION: Intraoperative fluoroscopy during ankle fracture fixation. Electronically Signed   By: Narda Rutherford M.D.   On: 01/22/2024 15:08   DG C-Arm 1-60 Min-No Report Result Date: 01/22/2024 Fluoroscopy was utilized by the requesting physician.  No radiographic interpretation.   DG C-Arm 1-60 Min-No Report Result Date: 01/22/2024 Fluoroscopy was utilized by the requesting physician.  No radiographic interpretation.   Assessment and Plan   Rachel Holmes is a 63 y.o. female with medical history  significant of end-stage renal disease on hemodialysis Monday Wednesday Friday, arthritis, history of congestive heart failure diastolic, type II diabetes on insulin, diabetic nephropathy, hyperlipidemia, is being admitted after undergoing right Bimalleolar ankle fracture surgery due to ongoing pain and malunion. Patient had sustained fracture in early part of January and had a boot placed.   Status post right Bimalleolar ankle fracture surgery due to ongoing pain and malunion.--Elective surgery -- Postop orders for orthopedic surgery -- DVT prophylaxis per ortho surgery -- PT OT evaluation noted. Recommends rehab [facility] for discharge planning   End-stage renal disease on hemodialysis Monday Wednesday Friday -- consultation with Dr. Cherylann Ratel for routine dialysis   Type II diabetes, insulin requiring, uncontrolled, neuropathy -- will resume home dose of insulin including sliding scale -- A1c 7.3-- last A1c October 2024 was 7.9 -- continue gabapentin   Hypertension -- resume Cardura, imdur, olmesartan  for now   Hyperlipidemia -- patient is on Zetia  TOC for discharge planning-- patient medically stable for discharge  Procedures: right ankle fracture surgery Family communication : none Consults : ortho CODE STATUS: full DVT Prophylaxis : will defer to orthopedic surgery--ASA 81 mg bid Level of care: Med-Surg Status is: Observation The patient remains OBS appropriate and will d/c before 2 midnights.    TOTAL TIME TAKING CARE OF THIS PATIENT: 35 minutes.  >50% time spent on counselling and coordination of care  Note: This dictation was prepared with Dragon dictation along with smaller phrase technology. Any transcriptional errors that result from this process are unintentional.  Nani Skillern.D  [facility]   CC: Primary care physician; Rayetta Humphrey, MD

## 2024-01-24 NOTE — Plan of Care (Signed)

## 2024-01-25 DIAGNOSIS — Z992 Dependence on renal dialysis: Secondary | ICD-10-CM | POA: Diagnosis not present

## 2024-01-25 DIAGNOSIS — S82841P Displaced bimalleolar fracture of right lower leg, subsequent encounter for closed fracture with malunion: Secondary | ICD-10-CM | POA: Diagnosis not present

## 2024-01-25 DIAGNOSIS — N186 End stage renal disease: Secondary | ICD-10-CM | POA: Diagnosis not present

## 2024-01-25 DIAGNOSIS — S82891P Other fracture of right lower leg, subsequent encounter for closed fracture with malunion: Secondary | ICD-10-CM | POA: Diagnosis not present

## 2024-01-25 DIAGNOSIS — E119 Type 2 diabetes mellitus without complications: Secondary | ICD-10-CM | POA: Diagnosis not present

## 2024-01-25 LAB — GLUCOSE, CAPILLARY
Glucose-Capillary: 198 mg/dL — ABNORMAL HIGH (ref 70–99)
Glucose-Capillary: 225 mg/dL — ABNORMAL HIGH (ref 70–99)
Glucose-Capillary: 314 mg/dL — ABNORMAL HIGH (ref 70–99)
Glucose-Capillary: 214 mg/dL — ABNORMAL HIGH (ref 70–99)

## 2024-01-25 MED ORDER — LACTULOSE 10 GM/15ML PO SOLN
20.0000 g | Freq: Two times a day (BID) | ORAL | Status: DC
  Administered 2024-01-25 – 2024-01-26 (×3): 20 g via ORAL
  Filled 2024-01-25 (×4): qty 30

## 2024-01-25 MED ORDER — INSULIN GLARGINE-YFGN 100 UNIT/ML ~~LOC~~ SOLN
16.0000 [IU] | Freq: Every day | SUBCUTANEOUS | Status: DC
  Administered 2024-01-25 – 2024-01-26 (×2): 16 [IU] via SUBCUTANEOUS
  Filled 2024-01-25 (×3): qty 0.16

## 2024-01-25 NOTE — Plan of Care (Signed)

## 2024-01-25 NOTE — [place] FL2 (Signed)
 [place] MEDICAID FL2 LEVEL OF CARE FORM     IDENTIFICATION  Patient Name: Rachel Holmes Birthdate: 1961/08/30 Sex: female Admission Date (Current Location): 01/22/2024  [facility] and [place] Number:  [address] [id number] [facility] dual complete) Facility and Address:  [facility], [address], [place], [place] [address]      Provider Number: [id number]  Attending Physician Name and Address:  Enedina Finner, MD  Relative Name and Phone Number:  Demetrius Revel (Sister)  [phone] [facility])    Current Level of Care: Hospital Recommended Level of Care: Skilled Nursing Facility Prior Approval Number:    Date Approved/Denied:   PASRR Number: [id number]  Discharge Plan: SNF    Current Diagnoses: Patient Active Problem List   Diagnosis Date Noted   Ankle fracture 01/22/2024   Painful diabetic neuropathy (HCC) 02/12/2022   Chronic pain syndrome 02/12/2022   Complication of vascular access for dialysis 10/16/2021   Urinary incontinence in female 07/21/2021   Hyposmolality and/or hyponatremia 06/18/2021   Type 2 diabetes mellitus with diabetic peripheral angiopathy without gangrene, with long-term current use of insulin (HCC) 12/01/2020   Difficulty walking 08/05/2020   Arthropathy of left shoulder 07/25/2020   Dysfunction of left rotator cuff 07/25/2020   Acidosis 09/20/2019   Benign hypertensive kidney disease with chronic kidney disease 09/20/2019   Proteinuria 09/20/2019   Secondary hyperparathyroidism of renal origin (HCC) 09/20/2019   Chronic metabolic acidosis 09/20/2019   Cataract 09/02/2019   Chronic left shoulder pain 07/13/2019   Wound dehiscence 05/29/2019   Hx of BKA, left (HCC) 05/24/2019   PVD (peripheral vascular disease) (HCC) 05/13/2019   Below-knee amputation of left lower extremity (HCC) 05/13/2019   Chronic osteomyelitis of ankle and foot, left (HCC) 05/05/2019   Chronic diastolic CHF (congestive heart failure) (HCC) 04/19/2019    Leg edema 04/19/2019   Acute bacterial endocarditis 04/19/2019   Chest pain of uncertain etiology 04/19/2019   Ulcer of left ankle (HCC) 04/14/2019   Ankle deformity, left 01/21/2019   Diabetic polyneuropathy associated with type 2 diabetes mellitus (HCC) 01/21/2019   Aortic valve endocarditis    Bacteremia due to Streptococcus 12/24/2018   Diabetic ulcer of left ankle associated with diabetes mellitus due to underlying condition, with fat layer exposed (HCC) 11/09/2018   Malignant hypertension (arteriolar nephrosclerosis), stage 1-4 or unspecified chronic kidney disease 07/15/2018   ESRD on dialysis (HCC) 07/15/2018   Morbid obesity with BMI of 45.0-49.9, adult (HCC) 07/15/2018   Excessive daytime sleepiness 07/15/2018   Other fatigue 03/11/2018   Essential hypertension 03/11/2018   Vitamin D deficiency 03/11/2018   Congestive heart failure (HCC) 03/11/2018   Primary osteoarthritis of right knee 01/06/2018   Type 2 diabetes mellitus treated with insulin (HCC) 10/03/2017   Chronic pain of right knee 10/02/2017   Obesity 10/02/2017   Charcot's joint of left foot 08/29/2017   Lymphedema 09/06/2016   Hyponatremia with extracellular fluid depletion 02/02/2016   Nausea with vomiting, unspecified 02/02/2016   Closed nondisplaced fracture of right patella 08/23/2015   Acute cystitis without hematuria 08/14/2014   Colitis 08/14/2014   HTN (hypertension), malignant 08/14/2014   Acute renal failure superimposed on stage 3 chronic kidney disease (HCC) 06/26/2014   Hyperparathyroidism, unspecified (HCC) 02/24/2014   Allergic rhinitis 02/22/2014   Onychomycosis 02/22/2014   Anxiety 11/05/2013   Bradycardia 05/14/2013   Gastro-esophageal reflux disease without esophagitis 04/05/2013   Anemia in other chronic diseases classified elsewhere 01/27/2013    Orientation RESPIRATION BLADDER Height & Weight  Self, Time, Situation, Place  Normal External catheter Weight: (!) 150.9 kg Height:      BEHAVIORAL SYMPTOMS/MOOD NEUROLOGICAL BOWEL NUTRITION STATUS      Continent (Last BM 3/19 recorded in flowsheets.) Diet  AMBULATORY STATUS COMMUNICATION OF NEEDS Skin   Extensive Assist Verbally Surgical wounds, Normal (LEFT BKA and s/p ORIF and osteotomy of R bimalleolar fx this admission.)                       Personal Care Assistance Level of Assistance  Bathing, Dressing Bathing Assistance: Maximum assistance   Dressing Assistance: Maximum assistance     Functional Limitations Info  Sight Sight Info: Impaired        SPECIAL CARE FACTORS FREQUENCY  PT (By licensed PT), OT (By licensed OT)     PT Frequency: 5x/week OT Frequency: 5x/week            Contractures Contractures Info: Not present (LEFT BKA pta)    Additional Factors Info  Code Status, Allergies Code Status Info: Full Code Allergies Info: Nifedipine, Penicillins, Statins, Hydralazine, Hydrocodone, Ivp Dye (Iodinated Contrast Media), Metoprolol, Oxycodone, Codeine, Ibuprofen           Current Medications (01/25/2024):  This is the current hospital active medication list Current Facility-Administered Medications  Medication Dose Route Frequency Provider Last Rate Last Admin   acetaminophen (TYLENOL) tablet 650 mg  650 mg Oral Q6H PRN Enedina Finner, MD   650 mg at 01/24/24 2116   Or   acetaminophen (TYLENOL) suppository 650 mg  650 mg Rectal Q6H PRN Enedina Finner, MD       aspirin chewable tablet 81 mg  81 mg Oral BID Oliver Barre, MD   81 mg at 01/25/24 0857   baclofen (LIORESAL) tablet 10 mg  10 mg Oral TID PRN Enedina Finner, MD       bisacodyl (DULCOLAX) suppository 10 mg  10 mg Rectal Daily PRN Enedina Finner, MD   10 mg at 01/25/24 0857   Chlorhexidine Gluconate Cloth 2 % PADS 6 each  6 each Topical Q0600 Wendee Beavers, NP   6 each at 01/25/24 0505   cyanocobalamin (VITAMIN B12) tablet 1,000 mcg  1,000 mcg Oral Daily Enedina Finner, MD   1,000 mcg at 01/25/24 0857   docusate sodium (COLACE)  capsule 100 mg  100 mg Oral BID Enedina Finner, MD   100 mg at 01/25/24 0856   doxazosin (CARDURA) tablet 8 mg  8 mg Oral Daily Enedina Finner, MD   8 mg at 01/25/24 0900   DULoxetine (CYMBALTA) DR capsule 30 mg  30 mg Oral Daily Enedina Finner, MD   30 mg at 01/25/24 0857   ezetimibe (ZETIA) tablet 10 mg  10 mg Oral Daily Enedina Finner, MD   10 mg at 01/25/24 0900   gabapentin (NEURONTIN) capsule 600 mg  600 mg Oral QHS Enedina Finner, MD   600 mg at 01/24/24 2116   heparin injection 1,000 Units  1,000 Units Intracatheter PRN Wendee Beavers, NP       insulin aspart (novoLOG) injection 0-5 Units  0-5 Units Subcutaneous QHS Enedina Finner, MD   2 Units at 01/24/24 2117   insulin aspart (novoLOG) injection 0-9 Units  0-9 Units Subcutaneous TID WC Enedina Finner, MD   2 Units at 01/25/24 0843   insulin glargine-yfgn (SEMGLEE) injection 16 Units  16 Units Subcutaneous QHS Enedina Finner, MD       irbesartan (AVAPRO) tablet 37.5 mg  37.5  mg Oral Daily Enedina Finner, MD   37.5 mg at 01/25/24 0900   isosorbide mononitrate (IMDUR) 24 hr tablet 120 mg  120 mg Oral BID Enedina Finner, MD   120 mg at 01/25/24 0857   lidocaine-prilocaine (EMLA) cream 1 Application  1 Application Topical PRN Wendee Beavers, NP       magnesium oxide (MAG-OX) tablet 400 mg  400 mg Oral [id number] Enedina Finner, MD   400 mg at 01/25/24 0505   multivitamin with minerals tablet 1 tablet  1 tablet Oral Daily Enedina Finner, MD   1 tablet at 01/25/24 0857   ondansetron (ZOFRAN-ODT) disintegrating tablet 4 mg  4 mg Oral Q8H PRN Oliver Barre, MD   4 mg at 01/23/24 1446   pantoprazole (PROTONIX) EC tablet 40 mg  40 mg Oral Daily Enedina Finner, MD   40 mg at 01/25/24 0857   pentafluoroprop-tetrafluoroeth (GEBAUERS) aerosol 1 Application  1 Application Topical PRN Wendee Beavers, NP       traMADol (ULTRAM) tablet 50 mg  50 mg Oral Q12H PRN Manuela Schwartz, NP   50 mg at 01/25/24 1914     Discharge Medications: Please see discharge summary for a list of discharge  medications.  Relevant Imaging Results:  Relevant Lab Results:   Additional Information ss [id number]  Bing Quarry, RN

## 2024-01-25 NOTE — Progress Notes (Signed)
 [facility]  PROGRESS NOTE   Subjective:   Patient offers no complaints.  Vital signs are stable.  Objective:  Vital signs: Blood pressure (!) 164/58, pulse 82, temperature 98.6 F (37 C), resp. rate 17, weight (!) 150.9 kg, SpO2 93%.  Intake/Output Summary (Last 24 hours) at 01/25/2024 1646 Last data filed at 01/25/2024 1034 Gross per 24 hour  Intake 480 ml  Output 500 ml  Net -20 ml   Filed Weights   01/23/24 0802 01/23/24 1120  Weight: (!) 152.5 kg (!) 150.9 kg     Physical Exam: General:  No acute distress  Head:  Normocephalic, atraumatic. Moist oral mucosal membranes  Eyes:  Anicteric  Neck:  Supple  Lungs:   Clear to auscultation, normal effort  Heart:  S1S2 no rubs  Abdomen:   Soft, nontender, bowel sounds present  Extremities:  peripheral edema.  Neurologic:  Awake, alert, following commands  Skin:  No lesions  Access:     Basic Metabolic Panel: Recent Labs  Lab 01/20/24 1432 01/22/24 1147 01/23/24 0844  NA 138 136 134*  K 4.1 4.0 3.9  CL 102 99 98  CO2 24  --  25  GLUCOSE 96 228* 185*  BUN 38* 33* 43*  CREATININE 4.38* 5.00* 4.76*  CALCIUM 8.6*  --  8.0*  PHOS  --   --  4.6   GFR: Estimated Creatinine Clearance: 19.1 mL/min (A) (by C-G formula based on SCr of 4.76 mg/dL (H)).  Liver Function Tests: Recent Labs  Lab 01/23/24 0844  ALBUMIN 2.9*   No results for input(s): "LIPASE", "AMYLASE" in the last 168 hours. No results for input(s): "AMMONIA" in the last 168 hours.  CBC: Recent Labs  Lab 01/20/24 1432 01/22/24 1147 01/22/24 1706 01/23/24 0844  WBC 7.2  --  5.2 7.3  HGB 10.3* 10.9* 10.6* 9.1*  HCT 32.7* 32.0* 32.9* 29.8*  MCV 94.2  --  92.4 95.5  PLT 229  --  207 184     HbA1C: Hemoglobin A1C  Date/Time Value Ref Range Status  07/13/2020 01:15 PM 7.5 (A) 4.0 - 5.6 % Final  05/04/2020 01:44 PM 7.7 (A) 4.0 - 5.6 % Final  01/22/2019 12:00 AM 8.4  Final  11/20/2013 09:54 AM 7.7 (H) 4.2 - 6.3 % Final     Comment:    The American Diabetes Association recommends that a primary goal of therapy should be <7% and that physicians should reevaluate the treatment regimen in patients with HbA1c values consistently >8%.    Hgb A1c MFr Bld  Date/Time Value Ref Range Status  01/22/2024 05:06 PM 7.3 (H) 4.8 - 5.6 % Final    Comment:    (NOTE) Pre diabetes:          5.7%-6.4%  Diabetes:              >6.4%  Glycemic control for   <7.0% adults with diabetes   05/06/2019 03:30 AM 7.3 (H) 4.8 - 5.6 % Final    Comment:    (NOTE) Pre diabetes:          5.7%-6.4% Diabetes:              >6.4% Glycemic control for   <7.0% adults with diabetes     Urinalysis: No results for input(s): "COLORURINE", "LABSPEC", "PHURINE", "GLUCOSEU", "HGBUR", "BILIRUBINUR", "KETONESUR", "PROTEINUR", "UROBILINOGEN", "NITRITE", "LEUKOCYTESUR" in the last 72 hours.  Invalid input(s): "APPERANCEUR"    Imaging: No results found.   Medications:     aspirin  81 mg Oral BID   Chlorhexidine Gluconate Cloth  6 each Topical Q0600   cyanocobalamin  1,000 mcg Oral Daily   docusate sodium  100 mg Oral BID   doxazosin  8 mg Oral Daily   DULoxetine  30 mg Oral Daily   ezetimibe  10 mg Oral Daily   gabapentin  600 mg Oral QHS   insulin aspart  0-5 Units Subcutaneous QHS   insulin aspart  0-9 Units Subcutaneous TID WC   insulin glargine-yfgn  16 Units Subcutaneous QHS   irbesartan  37.5 mg Oral Daily   isosorbide mononitrate  120 mg Oral BID   magnesium oxide  400 mg Oral Q0600   multivitamin with minerals  1 tablet Oral Daily   pantoprazole  40 mg Oral Daily    Assessment/ Plan:     63 y.o.  female with past medical conditions including anemia, anxiety, CHF, diabetes, diabetic nephropathy, GERD, hyperlipidemia, legally blind in left eye, and end-stage renal disease on hemodialysis.  Patient arrived to hospital for scheduled operative procedure on the right ankle.  Patient requested overnight stay in hospital to  receive PT evaluation and possible help at home.  Patient has been admitted under observation for Ankle fracture [S82.899A]    #1: ESRD: Patient had stable dialysis on Friday.  Tolerated fluid removal.  Next dialysis will be on Monday.   #2 chronic anemia: Continue anemia protocol.   #3: Secondary hyperparathyroidism: Will continue to monitor PTH, calcium and phosphorus levels.   #4: Hypertension: Continue irbesartan.   #5: Diabetes: Continue insulin as ordered.  Labs and medications reviewed. Will continue to follow along with you.   LOS: 0 Lorain Childes, MD [facility] 3/23/20254:46 PM

## 2024-01-25 NOTE — Progress Notes (Signed)
 [facility]  - [place] at [facility]   PATIENT NAME: Rachel Holmes    MR#:  [id number]  DATE OF BIRTH:  08-28-1961  SUBJECTIVE:  no new complaints No family at bedside. Sugars bit on the higher side. Insulin adjusted.  VITALS:  Blood pressure (!) 164/58, pulse 82, temperature 98.6 F (37 C), resp. rate 17, weight (!) 150.9 kg, SpO2 93%.  PHYSICAL EXAMINATION:   GENERAL:  63 y.o.-year-old patient with no acute distress. Morbid obesity LUNGS: Normal breath sounds bilaterally, no wheezing CARDIOVASCULAR: S1, S2 normal. No murmur   ABDOMEN: Soft, nontender, nondistended. Bowel sounds present.  EXTREMITIES left amputation stump, right ankle splint   HD access+ NEUROLOGIC: nonfocal  patient is alert and awake  LABORATORY PANEL:  CBC Recent Labs  Lab 01/23/24 0844  WBC 7.3  HGB 9.1*  HCT 29.8*  PLT 184    Chemistries  Recent Labs  Lab 01/23/24 0844  NA 134*  K 3.9  CL 98  CO2 25  GLUCOSE 185*  BUN 43*  CREATININE 4.76*  CALCIUM 8.0*    RADIOLOGY:  No results found.  Assessment and Plan   Rachel Holmes is a 63 y.o. female with medical history significant of end-stage renal disease on hemodialysis Monday Wednesday Friday, arthritis, history of congestive heart failure diastolic, type II diabetes on insulin, diabetic nephropathy, hyperlipidemia, is being admitted after undergoing right Bimalleolar ankle fracture surgery due to ongoing pain and malunion. Patient had sustained fracture in early part of January and had a boot placed.   Status post right Bimalleolar ankle fracture surgery due to ongoing pain and malunion.--Elective surgery -- Postop orders for orthopedic surgery -- DVT prophylaxis per ortho surgery -- PT OT evaluation noted. Recommends rehab [facility] for discharge planning   End-stage renal disease on hemodialysis Monday Wednesday Friday -- consultation with Dr. Cherylann Ratel for routine dialysis   Type II diabetes, insulin requiring,  uncontrolled, neuropathy -- will resume home dose of insulin including sliding scale -- A1c 7.3-- last A1c October 2024 was 7.9 -- continue gabapentin   Hypertension -- resume Cardura, imdur, olmesartan  for now   Hyperlipidemia -- patient is on Zetia  TOC for discharge planning-- patient medically stable for discharge  Procedures: right ankle fracture surgery Family communication : none Consults : ortho CODE STATUS: full DVT Prophylaxis : will defer to orthopedic surgery--ASA 81 mg bid Level of care: Med-Surg Status is: Observation The patient remains OBS appropriate and will d/c before 2 midnights.    TOTAL TIME TAKING CARE OF THIS PATIENT: 35 minutes.  >50% time spent on counselling and coordination of care  Note: This dictation was prepared with Dragon dictation along with smaller phrase technology. Any transcriptional errors that result from this process are unintentional.  Enedina Finner M.D    [facility]   CC: Primary care physician; Rayetta Humphrey, MD

## 2024-01-25 NOTE — TOC Progression Note (Signed)
 Transition of Care [facility]) - Progression Note    Patient Details  Name: Rachel Holmes MRN: [id number] Date of Birth: 02/06/1961  Transition [facility] CM/SW Contact  Bing Quarry, RN Phone Number: 01/25/2024, 11:29 AM  Clinical Narrative: 3/23: Sherron Monday to patient regarding PT recommendations for STR post discharge from acute care setting and patient is agreeable, offered area choice list but already had preference for [facility] [place], [place], and [facility] are preferences at this point. FL2 signed and CSW/CM referral sent to those choices via the electronic hub.    Gabriel Cirri MSN RN CM  RN Case Manager [place]  Transitions of Care Direct Dial: [phone] (Weekends Only) [facility] Main Office Phone: [phone] [facility] Fax: [phone] .com          Expected Discharge Plan and Services                                               Social Determinants of Health (SDOH) Interventions SDOH Screenings   Food Insecurity: No Food Insecurity (01/22/2024)  Housing: Low Risk  (01/22/2024)  Transportation Needs: No Transportation Needs (01/22/2024)  Utilities: Not At Risk (01/22/2024)  Alcohol Screen: Low Risk  (07/13/2019)  Depression (PHQ2-9): Low Risk  (08/06/2023)  Financial Resource Strain: High Risk (08/13/2023)   Received [facility]  Physical Activity: Insufficiently Active (08/13/2023)   Received from [facility]  Social Connections: Moderately Integrated (08/13/2023)   [facility]  Stress: Stress Concern Present (08/13/2023)   Received from [facility]  Tobacco Use: Low Risk  (01/22/2024)  Health Literacy: Adequate Health Literacy (08/13/2023)   Received from [facility]    Readmission Risk Interventions     No data to display

## 2024-01-25 NOTE — Plan of Care (Signed)

## 2024-01-26 DIAGNOSIS — S82891P Other fracture of right lower leg, subsequent encounter for closed fracture with malunion: Secondary | ICD-10-CM | POA: Diagnosis not present

## 2024-01-26 DIAGNOSIS — Z992 Dependence on renal dialysis: Secondary | ICD-10-CM | POA: Diagnosis not present

## 2024-01-26 DIAGNOSIS — S82841P Displaced bimalleolar fracture of right lower leg, subsequent encounter for closed fracture with malunion: Secondary | ICD-10-CM | POA: Diagnosis not present

## 2024-01-26 DIAGNOSIS — E119 Type 2 diabetes mellitus without complications: Secondary | ICD-10-CM | POA: Diagnosis not present

## 2024-01-26 DIAGNOSIS — N186 End stage renal disease: Secondary | ICD-10-CM | POA: Diagnosis not present

## 2024-01-26 LAB — RENAL FUNCTION PANEL
Albumin: 2.8 g/dL — ABNORMAL LOW (ref 3.5–5.0)
Anion gap: 12 (ref 5–15)
BUN: 73 mg/dL — ABNORMAL HIGH (ref 8–23)
CO2: 26 mmol/L (ref 22–32)
Calcium: 8.5 mg/dL — ABNORMAL LOW (ref 8.9–10.3)
Chloride: 95 mmol/L — ABNORMAL LOW (ref 98–111)
Creatinine, Ser: 8.82 mg/dL — ABNORMAL HIGH (ref 0.44–1.00)
GFR, Estimated: 5 mL/min — ABNORMAL LOW (ref >60)
Glucose, Bld: 166 mg/dL — ABNORMAL HIGH (ref 70–99)
Phosphorus: 7.1 mg/dL — ABNORMAL HIGH (ref 2.5–4.6)
Potassium: 4.7 mmol/L (ref 3.5–5.1)
Sodium: 133 mmol/L — ABNORMAL LOW (ref 135–145)

## 2024-01-26 LAB — GLUCOSE, CAPILLARY
Glucose-Capillary: 120 mg/dL — ABNORMAL HIGH (ref 70–99)
Glucose-Capillary: 149 mg/dL — ABNORMAL HIGH (ref 70–99)
Glucose-Capillary: 161 mg/dL — ABNORMAL HIGH (ref 70–99)
Glucose-Capillary: 183 mg/dL — ABNORMAL HIGH (ref 70–99)
Glucose-Capillary: 245 mg/dL — ABNORMAL HIGH (ref 70–99)

## 2024-01-26 LAB — CBC
HCT: 27.8 % — ABNORMAL LOW (ref 36.0–46.0)
Hemoglobin: 8.6 g/dL — ABNORMAL LOW (ref 12.0–15.0)
MCH: 29.8 pg (ref 26.0–34.0)
MCHC: 30.9 g/dL (ref 30.0–36.0)
MCV: 96.2 fL (ref 80.0–100.0)
Platelets: 185 K/uL (ref 150–400)
RBC: 2.89 MIL/uL — ABNORMAL LOW (ref 3.87–5.11)
RDW: 14.3 % (ref 11.5–15.5)
WBC: 8.5 K/uL (ref 4.0–10.5)
nRBC: 0 % (ref 0.0–0.2)

## 2024-01-26 MED ORDER — DIPHENHYDRAMINE HCL 25 MG PO CAPS
25.0000 mg | ORAL_CAPSULE | Freq: Three times a day (TID) | ORAL | Status: DC | PRN
Start: 2024-01-26 — End: 2024-01-27
  Administered 2024-01-26 – 2024-01-27 (×2): 25 mg via ORAL
  Filled 2024-01-26 (×3): qty 1

## 2024-01-26 MED ORDER — ALUM & MAG HYDROXIDE-SIMETH 200-200-20 MG/5ML PO SUSP
30.0000 mL | Freq: Four times a day (QID) | ORAL | Status: DC | PRN
  Administered 2024-01-26 (×2): 30 mL via ORAL
  Filled 2024-01-26 (×2): qty 30

## 2024-01-26 MED ORDER — EPOETIN ALFA-EPBX 4000 UNIT/ML IJ SOLN
INTRAMUSCULAR | Status: AC
  Filled 2024-01-26: qty 1

## 2024-01-26 MED ORDER — EPOETIN ALFA-EPBX 4000 UNIT/ML IJ SOLN
4000.0000 [IU] | INTRAMUSCULAR | Status: DC
  Administered 2024-01-26: 4000 [IU] via INTRAVENOUS
  Filled 2024-01-26: qty 1

## 2024-01-26 NOTE — Progress Notes (Signed)
 Hemodialysis Note:  Received patient in bed to unit. Alert and oriented. Informed consent singed and in chart.  Treatment initiated: 1903 Treatment completed: 1225  Access used: Left fistula Access issues: None  Patient requested to end treatment 37 minutes early. AMA form signed. HD NP notified.Transported back to room, alert without acute distress. Report given to patient's RN.  Total UF removed: 2.8 Liters Medications given: Retacrit 4000 units IV  Post HD weight: 145.9 KG  [facility]

## 2024-01-26 NOTE — Progress Notes (Signed)
 [facility]  ROUNDING NOTE   Subjective:   Rachel Holmes is a 63 y.o. female with past medical conditions including anemia, anxiety, CHF, diabetes, diabetic nephropathy, GERD, hyperlipidemia, legally blind in left eye, and end-stage renal disease on hemodialysis.  Patient arrived to hospital for scheduled operative procedure on the right ankle.  Patient requested overnight stay in hospital to receive PT evaluation and possible help at home.  Patient has been admitted under observation for Ankle fracture [S82.899A]  Patient is known to our practice and receives outpatient dialysis treatments at [facility] on a Monday Wednesday Friday schedule, supervised by Dr. Thedore Mins.   Patient seen and evaluated during dialysis   HEMODIALYSIS FLOWSHEET:  Blood Flow Rate (mL/min): 349 mL/min Arterial Pressure (mmHg): -149.49 mmHg Venous Pressure (mmHg): 241.61 mmHg TMP (mmHg): 20.4 mmHg Ultrafiltration Rate (mL/min): 1257 mL/min Dialysate Flow Rate (mL/min): 300 ml/min Dialysis Fluid Bolus: Normal Saline  Complains of mild itching, refused Benadryl    Objective:  Vital signs in last 24 hours:  Temp:  [98 F (36.7 C)-98.3 F (36.8 C)] 98.3 F (36.8 C) (03/24 0839) Pulse Rate:  [78-96] 94 (03/24 1130) Resp:  [10-22] 22 (03/24 1130) BP: (126-171)/(50-65) 126/64 (03/24 1130) SpO2:  [93 %-96 %] 95 % (03/24 1130) Weight:  [148.8 kg] 148.8 kg (03/24 0839)  Weight change:  Filed Weights   01/23/24 0802 01/23/24 1120 01/26/24 0839  Weight: (!) 152.5 kg (!) 150.9 kg (!) 148.8 kg    Intake/Output: I/O last 3 completed shifts: In: 480 [P.O.:480] Out: 1450 [Urine:1450]   Intake/Output this shift:  No intake/output data recorded.  Physical Exam: General: NAD, resting comfortably  Head: Normocephalic, atraumatic. Moist oral mucosal membranes  Eyes: Anicteric  Lungs:  Clear to auscultation, normal effort  Heart: Regular rate and rhythm  Abdomen:  Soft, nontender   Extremities: No peripheral edema.  Left AKA  Neurologic: Alert, moving all four extremities  Skin: No lesions  Access: Left aVF    Basic Metabolic Panel: Recent Labs  Lab 01/20/24 1432 01/22/24 1147 01/23/24 0844 01/26/24 0858  NA 138 136 134* 133*  K 4.1 4.0 3.9 4.7  CL 102 99 98 95*  CO2 24  --  25 26  GLUCOSE 96 228* 185* 166*  BUN 38* 33* 43* 73*  CREATININE 4.38* 5.00* 4.76* 8.82*  CALCIUM 8.6*  --  8.0* 8.5*  PHOS  --   --  4.6 7.1*    Liver Function Tests: Recent Labs  Lab 01/23/24 0844 01/26/24 0858  ALBUMIN 2.9* 2.8*   No results for input(s): "LIPASE", "AMYLASE" in the last 168 hours. No results for input(s): "AMMONIA" in the last 168 hours.  CBC: Recent Labs  Lab 01/20/24 1432 01/22/24 1147 01/22/24 1706 01/23/24 0844 01/26/24 0858  WBC 7.2  --  5.2 7.3 8.5  HGB 10.3* 10.9* 10.6* 9.1* 8.6*  HCT 32.7* 32.0* 32.9* 29.8* 27.8*  MCV 94.2  --  92.4 95.5 96.2  PLT 229  --  207 184 185    Cardiac Enzymes: No results for input(s): "CKTOTAL", "CKMB", "CKMBINDEX", "TROPONINI" in the last 168 hours.  BNP: Invalid input(s): "POCBNP"  CBG: Recent Labs  Lab 01/25/24 1130 01/25/24 1752 01/25/24 2004 01/26/24 0022 01/26/24 0809  GLUCAP 225* 314* 214* 120* 161*    Microbiology: Results for orders placed or performed during the hospital encounter of 05/28/19  SARS Coronavirus 2 (CEPHEID - Performed in [facility] hospital lab), [facility] Order     Status: None  Collection Time: 05/29/19 12:15 AM   Specimen: Nasopharyngeal Swab  Result Value Ref Range Status   SARS Coronavirus 2 NEGATIVE NEGATIVE Final    Comment: (NOTE) If result is NEGATIVE SARS-CoV-2 target nucleic acids are NOT DETECTED. The SARS-CoV-2 RNA is generally detectable in upper and lower  respiratory specimens during the acute phase of infection. The lowest  concentration of SARS-CoV-2 viral copies this assay can detect is 250  copies / mL. A negative result does not preclude  SARS-CoV-2 infection  and should not be used as the sole basis for treatment or other  patient management decisions.  A negative result may occur with  improper specimen collection / handling, submission of specimen other  than nasopharyngeal swab, presence of viral mutation(s) within the  areas targeted by this assay, and inadequate number of viral copies  (<250 copies / mL). A negative result must be combined with clinical  observations, patient history, and epidemiological information. If result is POSITIVE SARS-CoV-2 target nucleic acids are DETECTED. The SARS-CoV-2 RNA is generally detectable in upper and lower  respiratory specimens dur ing the acute phase of infection.  Positive  results are indicative of active infection with SARS-CoV-2.  Clinical  correlation with patient history and other diagnostic information is  necessary to determine patient infection status.  Positive results do  not rule out bacterial infection or co-infection with other viruses. If result is PRESUMPTIVE POSTIVE SARS-CoV-2 nucleic acids MAY BE PRESENT.   A presumptive positive result was obtained on the submitted specimen  and confirmed on repeat testing.  While 2019 novel coronavirus  (SARS-CoV-2) nucleic acids may be present in the submitted sample  additional confirmatory testing may be necessary for epidemiological  and / or clinical management purposes  to differentiate between  SARS-CoV-2 and other Sarbecovirus currently known to infect humans.  If clinically indicated additional testing with an alternate test  methodology [phone]) is advised. The SARS-CoV-2 RNA is generally  detectable in upper and lower respiratory sp ecimens during the acute  phase of infection. The expected result is Negative. Fact Sheet for Patients:  BoilerBrush.com.cy Fact Sheet for Healthcare Providers: https://pope.com/ This test is not yet approved or cleared by the [place] FDA and has been authorized for detection and/or diagnosis of SARS-CoV-2 by FDA under an Emergency Use Authorization (EUA).  This EUA will remain in effect (meaning this test can be used) for the duration of the COVID-19 declaration under Section 564(b)(1) of the Act, 21 U.S.C. section 360bbb-3(b)(1), unless the authorization is terminated or revoked sooner. Performed at [facility], [address] [place], [place] [address]   MRSA PCR Screening     Status: None   Collection Time: 05/29/19  4:44 AM   Specimen: Nasal Mucosa; Nasopharyngeal  Result Value Ref Range Status   MRSA by PCR NEGATIVE NEGATIVE Final    Comment:        The GeneXpert MRSA Assay (FDA approved for NASAL specimens only), is one component of a comprehensive MRSA colonization surveillance program. It is not intended to diagnose MRSA infection nor to guide or monitor treatment for MRSA infections. Performed at [facility], [address] [place], [place] [address]     Coagulation Studies: No results for input(s): "LABPROT", "INR" in the last 72 hours.  Urinalysis: No results for input(s): "COLORURINE", "LABSPEC", "PHURINE", "GLUCOSEU", "HGBUR", "BILIRUBINUR", "KETONESUR", "PROTEINUR", "UROBILINOGEN", "NITRITE", "LEUKOCYTESUR" in the last 72 hours.  Invalid input(s): "APPERANCEUR"    Imaging: No results found.  Medications:     aspirin  81 mg Oral BID   Chlorhexidine Gluconate Cloth  6 each Topical Q0600   cyanocobalamin  1,000 mcg Oral Daily   docusate sodium  100 mg Oral BID   doxazosin  8 mg Oral Daily   DULoxetine  30 mg Oral Daily   ezetimibe  10 mg Oral Daily   gabapentin  600 mg Oral QHS   insulin aspart  0-5 Units Subcutaneous QHS   insulin aspart  0-9 Units Subcutaneous TID WC   insulin glargine-yfgn  16 Units Subcutaneous QHS   irbesartan  37.5 mg Oral Daily   isosorbide mononitrate  120 mg Oral BID   lactulose  20 g Oral BID   magnesium oxide  400  mg Oral Q0600   multivitamin with minerals  1 tablet Oral Daily   pantoprazole  40 mg Oral Daily   acetaminophen **OR** acetaminophen, alum & mag hydroxide-simeth, baclofen, bisacodyl, heparin, lidocaine-prilocaine, ondansetron, pentafluoroprop-tetrafluoroeth, traMADol  Assessment/ Plan:  [name] is a 63 y.o.  female with past medical conditions including anemia, anxiety, CHF, diabetes, diabetic nephropathy, GERD, hyperlipidemia, legally blind in left eye, and end-stage renal disease on hemodialysis.  Patient arrived to hospital for scheduled operative procedure on the right ankle.  Patient requested overnight stay in hospital to receive PT evaluation and possible help at home.  Patient has been admitted under observation for Ankle fracture [S82.899A]  [facility]/MWF/left aVF/146.0 kg  Anemia of chronic kidney disease Lab Results  Component Value Date   HGB 8.6 (L) 01/26/2024  Hemoglobin decreased, will order low dose EPO with dialysis.    2.  End-stage renal disease on hemodialysis.  Receiving dialysis today, UF goal 3.5L as tolerated.  Next treatment scheduled for Wednesday.  3. Diabetes mellitus type II with chronic kidney disease/renal manifestations: insulin dependent. Home regimen includes Mounjaro and lispro. Most recent hemoglobin A1c is 7.3 on 01/22/24.   Glucose elevated at times. Primary team to manage SSI  4. Secondary Hyperparathyroidism: with outpatient labs: PTH 290, phosphorus 5.0, calcium 8.4 on 01/12/24.    Lab Results  Component Value Date   CALCIUM 8.5 (L) 01/26/2024   CAION 1.05 (L) 01/22/2024   PHOS 7.1 (H) 01/26/2024   Phosphorus elevated. Educated patient on low phosphorus diet. Currently prescribed calcitriol outpatient.   LOS: 0 Gabe Glace 3/24/202511:43 AM

## 2024-01-26 NOTE — TOC Progression Note (Signed)
 Transition of Care [facility]) - Progression Note    Patient Details  Name: Rachel Holmes MRN: [id number] Date of Birth: [birth year]  Transition of Care [facility]) CM/SW Contact  Marlowe Sax, RN Phone Number: 01/26/2024, 4:25 PM  Clinical Narrative:    Reviewed the bed offer, the patient is agreeabl;e to go to [facility], Ins pending         Expected Discharge Plan and Services                                               Social Determinants of Health (SDOH) Interventions SDOH Screenings   Food Insecurity: No Food Insecurity (01/22/2024)  Housing: Low Risk  (01/22/2024)  Transportation Needs: No Transportation Needs (01/22/2024)  Utilities: Not At Risk (01/22/2024)  Alcohol Screen: Low Risk  (07/13/2019)  Depression (PHQ2-9): Low Risk  (08/06/2023)  Financial Resource Strain: High Risk (08/13/2023)   Received from [facility]  Physical Activity: Insufficiently Active (08/13/2023)   Received from [facility]  Social Connections: Moderately Integrated (08/13/2023)   Received from [facility]  Stress: Stress Concern Present (08/13/2023)   Received from [facility]  Tobacco Use: Low Risk  (01/22/2024)  Health Literacy: Adequate Health Literacy (08/13/2023)   Received from [facility]    Readmission Risk Interventions     No data to display

## 2024-01-26 NOTE — Progress Notes (Signed)
 [facility]  - [place] [facility]   PATIENT NAME: Rachel Holmes    MR#:  [id number]  DATE OF BIRTH:  [birth year]  SUBJECTIVE:  no new complaints Seen at HD earlier VITALS:  Blood pressure (!) 141/100, pulse 90, temperature 98.5 F (36.9 C), temperature source Oral, resp. rate 13, weight (!) 145.9 kg, SpO2 97%.  PHYSICAL EXAMINATION:   GENERAL:  63 y.o.-year-old patient with no acute distress. Morbid obesity LUNGS: Normal breath sounds bilaterally, no wheezing CARDIOVASCULAR: S1, S2 normal. ABDOMEN: Soft, nontender, non distended EXTREMITIES left amputation stump, right ankle splint   HD access+ NEUROLOGIC: nonfocal  patient is alert and awake  LABORATORY PANEL:  CBC Recent Labs  Lab 01/26/24 0858  WBC 8.5  HGB 8.6*  HCT 27.8*  PLT 185    Chemistries  Recent Labs  Lab 01/26/24 0858  NA 133*  K 4.7  CL 95*  CO2 26  GLUCOSE 166*  BUN 73*  CREATININE 8.82*  CALCIUM 8.5*    RADIOLOGY:  No results found.  Assessment and Plan   Rachel Holmes is a 63 y.o. female with medical history significant of end-stage renal disease on hemodialysis Monday Wednesday Friday, arthritis, history of congestive heart failure diastolic, type II diabetes on insulin, diabetic nephropathy, hyperlipidemia, is being admitted after undergoing right Bimalleolar ankle fracture surgery due to ongoing pain and malunion. Patient had sustained fracture in early part of January and had a boot placed.   Status post right Bimalleolar ankle fracture surgery due to ongoing pain and malunion.--Elective surgery -- Postop orders for orthopedic surgery -- DVT prophylaxis per ortho surgery -- PT OT evaluation noted. Recommends rehab [facility] for discharge planning   End-stage renal disease on hemodialysis Monday Wednesday Friday -- consultation with Dr. Cherylann Ratel for routine dialysis   Type II diabetes, insulin requiring, uncontrolled, neuropathy -- will resume home dose of insulin  including sliding scale -- A1c 7.3-- last A1c October 2024 was 7.9 -- continue gabapentin   Hypertension -- resume Cardura, imdur, olmesartan  for now   Hyperlipidemia -- patient is on Zetia  TOC for discharge planning-- patient medically stable for discharge  Procedures: right ankle fracture surgery Family communication : none Consults : ortho CODE STATUS: full DVT Prophylaxis : will defer to orthopedic surgery--ASA 81 mg bid Level of care: Med-Surg Status is: Observation The patient remains OBS appropriate and will d/c before 2 midnights.    TOTAL TIME TAKING CARE OF THIS PATIENT: 35 minutes.  >50% time spent on counselling and coordination of care  Note: This dictation was prepared with Dragon dictation along with smaller phrase technology. Any transcriptional errors that result from this process are unintentional.  Enedina Finner M.D    [facility]   CC: Primary care physician; Rayetta Humphrey, MD

## 2024-01-26 NOTE — Plan of Care (Signed)
   Problem: Nutritional: Goal: Maintenance of adequate nutrition will improve Outcome: Progressing   Problem: Education: Goal: Knowledge of General Education information will improve Description: Including pain rating scale, medication(s)/side effects and non-pharmacologic comfort measures Outcome: Progressing

## 2024-01-26 NOTE — Progress Notes (Signed)
 Physical Therapy Treatment Patient Details Name: Rachel Holmes MRN: [id number] DOB: August 23, 1961 Today's Date: 01/26/2024   History of Present Illness Pt is a 63 yo female s/p ORIF and osteotomy of R bimalleolar fx.  PMH of HTNm PVD, CHF, DMII, HD, L eye vision deficits, L BKA.    PT Comments  Pt eager to do therapy after being in bed all weekend. Pt is showing excellent signs of progress toward bed mobility and tranfers, does well with exercises in recliner. RN given specific instructions on how to set up transfers for back to bed later in day. Pt happy to have gotten a good workout, asks for AN to come back and now finish bathing.     If plan is discharge home, recommend the following: Two people to help with walking and/or transfers;Two people to help with bathing/dressing/bathroom;Help with stairs or ramp for entrance;Assistance with feeding;Assist for transportation;Assistance with cooking/housework   Can travel by private vehicle     No  Equipment Recommendations  None recommended by PT    Recommendations for Other Services       Precautions / Restrictions Precautions Precautions: Fall Recall of Precautions/Restrictions: Intact Restrictions Weight Bearing Restrictions Per Provider Order: Yes RLE Weight Bearing Per Provider Order: Non weight bearing     Mobility  Bed Mobility Overal bed mobility: Needs Assistance Bed Mobility: Supine to Sit     Supine to sit: Contact guard, Used rails, HOB elevated     General bed mobility comments: moving quite well with bed rails, slight elevation    Transfers Overall transfer level: Needs assistance Equipment used: Rolling walker (2 wheels) Transfers: Bed to chair/wheelchair/BSC            Lateral/Scoot Transfers: Supervision, Contact guard assist General transfer comment: Left scoot pivot to recliner from Left EOB; Surgical foot rests on floor, author blocked recliner, Left prostheses donner for task     Ambulation/Gait                   Stairs             Wheelchair Mobility     Tilt Bed    Modified Rankin (Stroke Patients Only)       Balance                                            Communication    Cognition                                        Cueing    Exercises Other Exercises Other Exercises: seated LAQ 1x10x3secH bilat Other Exercises: seated marching in recline 1x10x3secH Other Exercises: seated back extension isometric into 2 pillow 10x3secH    General Comments        Pertinent Vitals/Pain Pain Assessment Pain Assessment: No/denies pain    Home Living                          Prior Function            PT Goals (current goals can now be found in the care plan section) Acute Rehab PT Goals Patient Stated Goal: to return to PLOF as able PT Goal Formulation: With patient Time For Goal Achievement: 02/06/24 Potential  to Achieve Goals: Good Progress towards PT goals: Progressing toward goals    Frequency    Min 2X/week      PT Plan      Co-evaluation              AM-PAC PT "6 Clicks" Mobility   Outcome Measure  Help needed turning from your back to your side while in a flat bed without using bedrails?: A Little Help needed moving from lying on your back to sitting on the side of a flat bed without using bedrails?: A Little Help needed moving to and from a bed to a chair (including a wheelchair)?: A Little Help needed standing up from a chair using your arms (e.g., wheelchair or bedside chair)?: Total Help needed to walk in hospital room?: Total Help needed climbing 3-5 steps with a railing? : Total 6 Click Score: 12    End of Session   Activity Tolerance: Patient tolerated treatment well;Patient limited by fatigue Patient left: in chair;with call bell/phone within reach Nurse Communication: Mobility status PT Visit Diagnosis: Other abnormalities of gait and  mobility (R26.89);Difficulty in walking, not elsewhere classified (R26.2);Muscle weakness (generalized) (M62.81);Pain Pain - Right/Left: Right Pain - part of body: Ankle and joints of foot     Time: 1540-1604 PT Time Calculation (min) (ACUTE ONLY): 24 min  Charges:    $Therapeutic Exercise: 8-22 mins $Therapeutic Activity: 8-22 mins PT General Charges $$ ACUTE PT VISIT: 1 Visit                    5:41 PM, 01/26/24 Rosamaria Lints, PT, DPT Physical Therapist - [facility]  [phone] (ASCOM)     Addilynn Mowrer C 01/26/2024, 5:40 PM

## 2024-01-27 DIAGNOSIS — S82891P Other fracture of right lower leg, subsequent encounter for closed fracture with malunion: Secondary | ICD-10-CM | POA: Diagnosis not present

## 2024-01-27 DIAGNOSIS — S82841P Displaced bimalleolar fracture of right lower leg, subsequent encounter for closed fracture with malunion: Secondary | ICD-10-CM | POA: Diagnosis not present

## 2024-01-27 DIAGNOSIS — N186 End stage renal disease: Secondary | ICD-10-CM | POA: Diagnosis not present

## 2024-01-27 DIAGNOSIS — Z992 Dependence on renal dialysis: Secondary | ICD-10-CM | POA: Diagnosis not present

## 2024-01-27 LAB — GLUCOSE, CAPILLARY
Glucose-Capillary: 192 mg/dL — ABNORMAL HIGH (ref 70–99)
Glucose-Capillary: 211 mg/dL — ABNORMAL HIGH (ref 70–99)

## 2024-01-27 MED ORDER — OXYCODONE HCL 5 MG PO TABS: 5.0000 mg | ORAL_TABLET | ORAL | 0 refills | Status: DC | PRN

## 2024-01-27 MED ORDER — ISOSORBIDE MONONITRATE ER 120 MG PO TB24: 120.0000 mg | ORAL_TABLET | Freq: Two times a day (BID) | ORAL | Status: AC

## 2024-01-27 MED ORDER — ASPIRIN 81 MG PO TBEC: 81.0000 mg | DELAYED_RELEASE_TABLET | Freq: Two times a day (BID) | ORAL | 0 refills | Status: AC

## 2024-01-27 NOTE — TOC Progression Note (Signed)
 Transition of Care [facility]) - Progression Note    Patient Details  Name: Rachel Holmes MRN: [id number] Date of Birth: [birth year]  [facility] CM/SW Contact  Marlowe Sax, RN Phone Number: 01/27/2024, 10:07 AM  Clinical Narrative:      Approved Plan Auth ID: [id number] dates: 3.25-3.27.25 next review date: 3.27.2025       Expected Discharge Plan and Services                                               Social Determinants of Health (SDOH) Interventions SDOH Screenings   Food Insecurity: No Food Insecurity (01/22/2024)  Housing: Low Risk  (01/22/2024)  Transportation Needs: No Transportation Needs (01/22/2024)  Utilities: Not At Risk (01/22/2024)  Alcohol Screen: Low Risk  (07/13/2019)  Depression (PHQ2-9): Low Risk  (08/06/2023)  Financial Resource Strain: High Risk (08/13/2023)   Received from [facility]  Physical Activity: Insufficiently Active (08/13/2023)   Received from [facility]  Social Connections: Moderately Integrated (08/13/2023)   Received from [facility]  Stress: Stress Concern Present (08/13/2023)   Received from [facility]  Tobacco Use: Low Risk  (01/22/2024)  Health Literacy: Adequate Health Literacy (08/13/2023)   Received from [facility]    Readmission Risk Interventions     No data to display

## 2024-01-27 NOTE — Progress Notes (Signed)
 PT Cancellation Note  Patient Details Name: Rachel Holmes MRN: [id number] DOB: 23-Nov-1960   Cancelled Treatment:    Reason Eval/Treat Not Completed: Other (comment) (Chart reviewed, spoke to pt at bedside. Pt in process of preparing for DC to [facility]. Will signoff and allow for transition.)  12:00 PM, 01/27/24 Rosamaria Lints, PT, DPT Physical Therapist - [facility]  [phone] (ASCOM)    [name] 01/27/2024, 12:00 PM

## 2024-01-27 NOTE — Discharge Summary (Signed)
 Physician Discharge Summary   Patient: Rachel Holmes MRN: [id number] DOB: 12/18/1960  Admit date:     01/22/2024  Discharge date: 01/27/24  Discharge Physician: Enedina Finner   PCP: Rayetta Humphrey, MD   Recommendations at discharge:   resume your hemodialysis Monday Wednesday Friday as before follow discharge instructions per orthopedic Dr.Cairns follow up with PCP in 1 to 2 weeks  Discharge Diagnoses: Principal Problem:   Ankle fracture Active Problems:   Type 2 diabetes mellitus treated with insulin (HCC)   ESRD on dialysis [facility])   Rachel Holmes is a 63 y.o. female with medical history significant of end-stage renal disease on hemodialysis Monday Wednesday Friday, arthritis, history of congestive heart failure diastolic, type II diabetes on insulin, diabetic nephropathy, hyperlipidemia, is being admitted after undergoing right Bimalleolar ankle fracture surgery due to ongoing pain and malunion. Patient had sustained fracture in early part of January and had a boot placed.   Status post right Bimalleolar ankle fracture surgery due to ongoing pain and malunion.--Elective surgery -- Postop orders for orthopedic surgery -- DVT prophylaxis per ortho surgery -- PT OT evaluation noted. Recommends rehab [facility] for discharge planning to [facility]   End-stage renal disease on hemodialysis Monday Wednesday Friday -- consultation with Dr. Cherylann Ratel for routine dialysis   Type II diabetes, insulin requiring, uncontrolled, neuropathy -- will resume home dose of insulin including sliding scale -- A1c 7.3-- last A1c October 2024 was 7.9 -- continue gabapentin   Hypertension -- resume Cardura, imdur, olmesartan  for now   Hyperlipidemia -- patient is on Zetia   TOC for discharge planning-- patient medically stable for discharge today. She is agreeable   Procedures: right ankle fracture surgery Family communication : none Consults : ortho CODE STATUS: full DVT Prophylaxis : will defer to  orthopedic surgery--ASA 81 mg bid     Pain control - [place] Controlled Substance Reporting System database was reviewed. and patient was instructed, not to drive, operate heavy machinery, perform activities at heights, swimming or participation in water activities or provide baby-sitting services while on Pain, Sleep and Anxiety Medications; until their outpatient Physician has advised to do so again. Also recommended to not to take more than prescribed Pain, Sleep and Anxiety Medications.  Disposition: Rehabilitation facility Diet recommendation:  Discharge Diet Orders (From admission, onward)     Start     Ordered   01/27/24 0000  Diet - low sodium heart healthy        01/27/24 1016           Renal diet DISCHARGE MEDICATION: Allergies as of 01/27/2024       Reactions   Nifedipine Rash   Penicillins Hives, Shortness Of Breath, Swelling   Statins Shortness Of Breath   Wheezing   Hydralazine Itching   Hydrocodone Itching, Nausea Only   Ivp Dye [iodinated Contrast Media]    ESRD   Metoprolol Diarrhea, Other (See Comments)   Pt states "make my heart race"   Oxycodone Nausea Only   Codeine Nausea Only   Ibuprofen Other (See Comments)   Raises blood pressure        Medication List     STOP taking these medications    ondansetron 4 MG disintegrating tablet Commonly known as: ZOFRAN-ODT   traMADol 50 MG tablet Commonly known as: ULTRAM       TAKE these medications    acetaminophen 500 MG tablet Commonly known as: TYLENOL Take 2 tablets (1,000 mg total) by mouth every  6 (six) hours as needed. What changed: reasons to take this   albuterol 108 (90 Base) MCG/ACT inhaler Commonly known as: VENTOLIN HFA Inhale 1-2 puffs into the lungs every 6 (six) hours as needed for wheezing or shortness of breath.   aspirin EC 81 MG tablet Take 1 tablet (81 mg total) by mouth in the morning and at bedtime. Swallow whole. What changed:  when to take this additional  instructions   baclofen 10 MG tablet Commonly known as: LIORESAL Take 10 mg by mouth 3 (three) times daily as needed for muscle spasms.   calcitRIOL 0.25 MCG capsule Commonly known as: ROCALTROL Take 0.25 mcg by mouth every Monday, Wednesday, and Friday with hemodialysis.   cetirizine 10 MG tablet Commonly known as: ZYRTEC Take 10 mg by mouth at bedtime.   clobetasol cream 0.05 % Commonly known as: TEMOVATE Apply 1 application  topically 2 (two) times daily as needed (rash).   cloNIDine 0.3 MG tablet Commonly known as: CATAPRES Take 1 tablet (0.3 mg total) by mouth 2 (two) times daily.   cyanocobalamin 1000 MCG tablet Commonly known as: VITAMIN B12 Take 1,000 mcg by mouth daily.   docusate sodium 100 MG capsule Commonly known as: COLACE Take 1 capsule (100 mg total) by mouth 2 (two) times daily.   doxazosin 8 MG tablet Commonly known as: CARDURA Take 8 mg by mouth daily.   DULoxetine 30 MG capsule Commonly known as: CYMBALTA Take 30 mg by mouth at bedtime.   ezetimibe 10 MG tablet Commonly known as: ZETIA Take 1 tablet (10 mg total) by mouth daily.   FreeStyle Libre 14 Day Reader Hardie Pulley USE AS DIRECTED   gabapentin 600 MG tablet Commonly known as: NEURONTIN Take 600 mg by mouth at bedtime.   glucosamine-chondroitin 500-400 MG tablet Take 1 tablet by mouth in the morning and at bedtime.   hydrOXYzine 25 MG tablet Commonly known as: ATARAX Take 25 mg by mouth every 6 (six) hours as needed for itching.   insulin degludec 100 UNIT/ML FlexTouch Pen Commonly known as: TRESIBA Inject 14 Units into the skin at bedtime.   isosorbide mononitrate 120 MG 24 hr tablet Commonly known as: IMDUR Take 1 tablet (120 mg total) by mouth in the morning and at bedtime.   lactulose 10 GM/15ML solution Commonly known as: CHRONULAC Take 15 mLs by mouth daily as needed for moderate constipation.   Lyumjev KwikPen 100 UNIT/ML KwikPen Generic drug: Insulin Lispro-aabc Inject  10-20 Units into the skin in the morning and at bedtime. With meals   Magnesium 400 MG Caps Take 400 mg by mouth at bedtime.   Mounjaro 7.5 MG/0.5ML Pen Generic drug: tirzepatide Inject 7.5 mg into the skin once a week.   multivitamin with minerals Tabs tablet Take 1 tablet by mouth daily.   olmesartan 40 MG tablet Commonly known as: BENICAR Take 40 mg by mouth daily.   omeprazole 40 MG capsule Commonly known as: PRILOSEC Take 40 mg by mouth 2 (two) times daily with breakfast and lunch.   ondansetron 4 MG tablet Commonly known as: Zofran Take 1 tablet (4 mg total) by mouth every 8 (eight) hours as needed for up to 14 days for nausea or vomiting.   onetouch ultrasoft lancets Used to check blood sugars four times daily.   OneTouch Verio test strip Generic drug: glucose blood USE TO CHECK BLOOD SUGAR TWICE DAILY   oxyCODONE 5 MG immediate release tablet Commonly known as: Roxicodone Take 1 tablet (5 mg total)  by mouth every 4 (four) hours as needed.   torsemide 100 MG tablet Commonly known as: DEMADEX Take 1 tablet (100 mg total) by mouth 2 (two) times daily.        Follow-up Information     Oliver Barre, MD Follow up.   Specialties: Orthopedic Surgery, Sports Medicine Why: MD office will contact you for follow up appointment . Contact information: [address] [place] [address] [phone]         Rayetta Humphrey, MD. Schedule an appointment as soon as possible for a visit in 1 week(s).   Specialty: Family Medicine Contact information: [address] [place] [address] [phone]                Discharge Exam: Ceasar Mons Weights   01/23/24 1120 01/26/24 0839 01/26/24 1230  Weight: (!) 150.9 kg (!) 148.8 kg (!) 145.9 kg    GENERAL:  63 y.o.-year-old patient with no acute distress. Morbid obesity LUNGS: Normal breath sounds bilaterally, no wheezing CARDIOVASCULAR: S1, S2 normal. ABDOMEN: Soft, nontender, non  distended EXTREMITIES left amputation stump, right ankle splint   HD access+ Condition at discharge: fair  The results of significant diagnostics from this hospitalization (including imaging, microbiology, ancillary and laboratory) are listed below for reference.   Imaging Studies: DG Ankle Complete Right Result Date: 01/22/2024 CLINICAL DATA:  Pain. Closed fracture of right fibula, follow-up exam. EXAM: RIGHT ANKLE - COMPLETE 3+ VIEW COMPARISON:  12/18/2023 FINDINGS: Increasing displacement and angulation of distal fibular shaft fracture. Incomplete peripheral callus formation. Increasing displacement of medial malleolar fracture. There is angulation of the distal fracture fragments and ankle mortise, apex medial. Joint effusion and generalized soft tissue edema. There is prominent midfoot degenerative change. IMPRESSION: 1. Increasing displacement and angulation of distal fibular shaft fracture. Incomplete peripheral callus formation. 2. Increasing displacement of medial malleolar fracture. 3. Angulation of the distal fracture fragments and ankle mortise, apex medial. Electronically Signed   By: Narda Rutherford M.D.   On: 01/22/2024 15:10   DG Ankle 2 Views Right Result Date: 01/22/2024 CLINICAL DATA:  Elective surgery. EXAM: RIGHT ANKLE - 2 VIEW COMPARISON:  Preoperative imaging FINDINGS: Three fluoroscopic spot views of the ankle submitted from the operating room. Lateral plate and screw fixation of distal fibular fracture with 2 syndesmotic screws. Two screws traverse the medial malleolar fracture. Fluoroscopy time 1 minute 12 seconds. Dose 1.8754 mGy. IMPRESSION: Intraoperative fluoroscopy during ankle fracture fixation. Electronically Signed   By: Narda Rutherford M.D.   On: 01/22/2024 15:08   DG C-Arm 1-60 Min-No Report Result Date: 01/22/2024 Fluoroscopy was utilized by the requesting physician.  No radiographic interpretation.   DG C-Arm 1-60 Min-No Report Result Date:  01/22/2024 Fluoroscopy was utilized by the requesting physician.  No radiographic interpretation.   [name] OR NERVE BLOCK-IMAGE ONLY [facility]) Result Date: 01/22/2024 There is no interpretation for this exam.  This order is for images obtained during a surgical procedure.  Please See "Surgeries" Tab for more information regarding the procedure.    Microbiology: Results for orders placed or performed during the hospital encounter of 05/28/19  SARS Coronavirus 2 (CEPHEID - Performed in [facility] hospital lab), Hosp Order     Status: None   Collection Time: 05/29/19 12:15 AM   Specimen: Nasopharyngeal Swab  Result Value Ref Range Status   SARS Coronavirus 2 NEGATIVE NEGATIVE Final    Comment: (NOTE) If result is NEGATIVE SARS-CoV-2 target nucleic acids are NOT DETECTED. The SARS-CoV-2  RNA is generally detectable in upper and lower  respiratory specimens during the acute phase of infection. The lowest  concentration of SARS-CoV-2 viral copies this assay can detect is 250  copies / mL. A negative result does not preclude SARS-CoV-2 infection  and should not be used as the sole basis for treatment or other  patient management decisions.  A negative result may occur with  improper specimen collection / handling, submission of specimen other  than nasopharyngeal swab, presence of viral mutation(s) within the  areas targeted by this assay, and inadequate number of viral copies  (<250 copies / mL). A negative result must be combined with clinical  observations, patient history, and epidemiological information. If result is POSITIVE SARS-CoV-2 target nucleic acids are DETECTED. The SARS-CoV-2 RNA is generally detectable in upper and lower  respiratory specimens dur ing the acute phase of infection.  Positive  results are indicative of active infection with SARS-CoV-2.  Clinical  correlation with patient history and other diagnostic information is  necessary to determine patient infection status.   Positive results do  not rule out bacterial infection or co-infection with other viruses. If result is PRESUMPTIVE POSTIVE SARS-CoV-2 nucleic acids MAY BE PRESENT.   A presumptive positive result was obtained on the submitted specimen  and confirmed on repeat testing.  While 2019 novel coronavirus  (SARS-CoV-2) nucleic acids may be present in the submitted sample  additional confirmatory testing may be necessary for epidemiological  and / or clinical management purposes  to differentiate between  SARS-CoV-2 and other Sarbecovirus currently known to infect humans.  If clinically indicated additional testing with an alternate test  methodology [phone]) is advised. The SARS-CoV-2 RNA is generally  detectable in upper and lower respiratory sp ecimens during the acute  phase of infection. The expected result is Negative. Fact Sheet for Patients:  BoilerBrush.com.cy Fact Sheet for Healthcare Providers: https://pope.com/ This test is not yet approved or cleared by the [place] FDA and has been authorized for detection and/or diagnosis of SARS-CoV-2 by FDA under an Emergency Use Authorization (EUA).  This EUA will remain in effect (meaning this test can be used) for the duration of the COVID-19 declaration under Section 564(b)(1) of the Act, 21 U.S.C. section 360bbb-3(b)(1), unless the authorization is terminated or revoked sooner. Performed at [facility], [address] [place], [place] [address]   MRSA PCR Screening     Status: None   Collection Time: 05/29/19  4:44 AM   Specimen: Nasal Mucosa; Nasopharyngeal  Result Value Ref Range Status   MRSA by PCR NEGATIVE NEGATIVE Final    Comment:        The GeneXpert MRSA Assay (FDA approved for NASAL specimens only), is one component of a comprehensive MRSA colonization surveillance program. It is not intended to diagnose MRSA infection nor to guide or monitor treatment  for MRSA infections. Performed at [facility], [address] [place], [place] [address]     Labs: CBC: Recent Labs  Lab 01/20/24 1432 01/22/24 1147 01/22/24 1706 01/23/24 0844 01/26/24 0858  WBC 7.2  --  5.2 7.3 8.5  HGB 10.3* 10.9* 10.6* 9.1* 8.6*  HCT 32.7* 32.0* 32.9* 29.8* 27.8*  MCV 94.2  --  92.4 95.5 96.2  PLT 229  --  207 184 185   Basic Metabolic Panel: Recent Labs  Lab 01/20/24 1432 01/22/24 1147 01/23/24 0844 01/26/24 0858  NA 138 136 134* 133*  K 4.1 4.0 3.9 4.7  CL 102 99 98  95*  CO2 24  --  25 26  GLUCOSE 96 228* 185* 166*  BUN 38* 33* 43* 73*  CREATININE 4.38* 5.00* 4.76* 8.82*  CALCIUM 8.6*  --  8.0* 8.5*  PHOS  --   --  4.6 7.1*   Liver Function Tests: Recent Labs  Lab 01/23/24 0844 01/26/24 0858  ALBUMIN 2.9* 2.8*   CBG: Recent Labs  Lab 01/26/24 0809 01/26/24 1258 01/26/24 1622 01/26/24 2150 01/27/24 0839  GLUCAP 161* 149* 183* 245* 192*    Discharge time spent: greater than 30 minutes.  Signed: Enedina Finner, MD [facility] 01/27/2024

## 2024-01-27 NOTE — TOC Progression Note (Signed)
 Transition of Care [facility]) - Progression Note    Patient Details  Name: RHAYA COALE MRN: [id number] Date of Birth: 26-Apr-1961  [facility]) CM/SW Contact  Marlowe Sax, RN Phone Number: 01/27/2024, 12:01 PM  Clinical Narrative:    [facility] notified me that they will accept her to room 36B         Expected Discharge Plan and Services         Expected Discharge Date: 01/27/24                                     Social Determinants of Health (SDOH) Interventions SDOH Screenings   Food Insecurity: No Food Insecurity (01/22/2024)  Housing: Low Risk  (01/22/2024)  Transportation Needs: No Transportation Needs (01/22/2024)  Utilities: Not At Risk (01/22/2024)  Alcohol Screen: Low Risk  (07/13/2019)  Depression (PHQ2-9): Low Risk  (08/06/2023)  Financial Resource Strain: High Risk (08/13/2023)   Received from [facility]  Physical Activity: Insufficiently Active (08/13/2023)   Received from [facility]  Social Connections: Moderately Integrated (08/13/2023)   Received from [facility]  Stress: Stress Concern Present (08/13/2023)   Received from [facility]  Tobacco Use: Low Risk  (01/22/2024)  Health Literacy: Adequate Health Literacy (08/13/2023)   Received from [facility]    Readmission Risk Interventions     No data to display

## 2024-01-27 NOTE — TOC Progression Note (Signed)
 Transition of Care [facility]) - Progression Note    Patient Details  Name: Rachel Holmes MRN: [id number] Date of Birth: 05/24/1961  Transition of Care [facility]) CM/SW Contact  Marlowe Sax, RN Phone Number: 01/27/2024, 12:11 PM  Clinical Narrative:    Going to room 36B [facility] called to transport        Expected Discharge Plan and Services         Expected Discharge Date: 01/27/24                                     Social Determinants of Health (SDOH) Interventions SDOH Screenings   Food Insecurity: No Food Insecurity (01/22/2024)  Housing: Low Risk  (01/22/2024)  Transportation Needs: No Transportation Needs (01/22/2024)  Utilities: Not At Risk (01/22/2024)  Alcohol Screen: Low Risk  (07/13/2019)  Depression (PHQ2-9): Low Risk  (08/06/2023)  Financial Resource Strain: High Risk (08/13/2023)   Received from [facility]  Physical Activity: Insufficiently Active (08/13/2023)   Received from [facility]  Social Connections: Moderately Integrated (08/13/2023)   Received from [facility]  Stress: Stress Concern Present (08/13/2023)   Received from [facility]  Tobacco Use: Low Risk  (01/22/2024)  Health Literacy: Adequate Health Literacy (08/13/2023)   Received from [facility]    Readmission Risk Interventions     No data to display

## 2024-01-27 NOTE — Plan of Care (Signed)

## 2024-01-27 NOTE — TOC Progression Note (Signed)
 Transition of Care [facility]) - Progression Note    Patient Details  Name: Rachel Holmes MRN: [id number] Date of Birth: [birth year]  [facility]) CM/SW Contact  Marlowe Sax, RN Phone Number: 01/27/2024, 11:22 AM  Clinical Narrative:    Notified the patient that her Ins approved to go [facility] today I notified Tonya at [facility] that ins approived and she has dialysis MWF at [facility] Waiting [facility] to arrange [facility]        Expected Discharge Plan and Services         Expected Discharge Date: 01/27/24                                     Social Determinants of Health (SDOH) Interventions SDOH Screenings   Food Insecurity: No Food Insecurity (01/22/2024)  Housing: Low Risk  (01/22/2024)  Transportation Needs: No Transportation Needs (01/22/2024)  Utilities: Not At Risk (01/22/2024)  Alcohol Screen: Low Risk  (07/13/2019)  Depression (PHQ2-9): Low Risk  (08/06/2023)  Financial Resource Strain: High Risk (08/13/2023)   Received from [facility]  Physical Activity: Insufficiently Active (08/13/2023)   Received from [facility]  Social Connections: Moderately Integrated (08/13/2023)   Received from [facility]  Stress: Stress Concern Present (08/13/2023)   Received from [facility]  Tobacco Use: Low Risk  (01/22/2024)  Health Literacy: Adequate Health Literacy (08/13/2023)   Received from [facility]    Readmission Risk Interventions     No data to display

## 2024-01-27 NOTE — Progress Notes (Signed)
 [facility]  ROUNDING NOTE   Subjective:   Rachel Holmes is a 63 y.o. female with past medical conditions including anemia, anxiety, CHF, diabetes, diabetic nephropathy, GERD, hyperlipidemia, legally blind in left eye, and end-stage renal disease on hemodialysis.  Patient arrived to hospital for scheduled operative procedure on the right ankle.  Patient requested overnight stay in hospital to receive PT evaluation and possible help at home.  Patient has been admitted under observation for Ankle fracture [S82.899A]  Patient is known to our practice and receives outpatient dialysis treatments at [facility] on a Monday Wednesday Friday schedule, supervised by Dr. Thedore Mins.   Patient seen laying in bed Completed breakfast tray at bedside  Pending d/c to rehab   Objective:  Vital signs in last 24 hours:  Temp:  [97.7 F (36.5 C)-98.1 F (36.7 C)] 98 F (36.7 C) (03/25 0900) Pulse Rate:  [80-91] 80 (03/25 0900) Resp:  [16-18] 18 (03/25 0900) BP: (129-182)/(53-57) 166/55 (03/25 0900) SpO2:  [95 %-100 %] 97 % (03/25 0900)  Weight change:  Filed Weights   01/23/24 1120 01/26/24 0839 01/26/24 1230  Weight: (!) 150.9 kg (!) 148.8 kg (!) 145.9 kg    Intake/Output: I/O last 3 completed shifts: In: -  Out: 3600 [Urine:800; Other:2800]   Intake/Output this shift:  No intake/output data recorded.  Physical Exam: General: NAD, resting comfortably  Head: Normocephalic, atraumatic. Moist oral mucosal membranes  Eyes: Anicteric  Lungs:  Clear to auscultation, normal effort  Heart: Regular rate and rhythm  Abdomen:  Soft, nontender  Extremities: No peripheral edema.  Left AKA  Neurologic: Alert, moving all four extremities  Skin: No lesions  Access: Left aVF    Basic Metabolic Panel: Recent Labs  Lab 01/20/24 1432 01/22/24 1147 01/23/24 0844 01/26/24 0858  NA 138 136 134* 133*  K 4.1 4.0 3.9 4.7  CL 102 99 98 95*  CO2 24  --  25 26  GLUCOSE 96  228* 185* 166*  BUN 38* 33* 43* 73*  CREATININE 4.38* 5.00* 4.76* 8.82*  CALCIUM 8.6*  --  8.0* 8.5*  PHOS  --   --  4.6 7.1*    Liver Function Tests: Recent Labs  Lab 01/23/24 0844 01/26/24 0858  ALBUMIN 2.9* 2.8*   No results for input(s): "LIPASE", "AMYLASE" in the last 168 hours. No results for input(s): "AMMONIA" in the last 168 hours.  CBC: Recent Labs  Lab 01/20/24 1432 01/22/24 1147 01/22/24 1706 01/23/24 0844 01/26/24 0858  WBC 7.2  --  5.2 7.3 8.5  HGB 10.3* 10.9* 10.6* 9.1* 8.6*  HCT 32.7* 32.0* 32.9* 29.8* 27.8*  MCV 94.2  --  92.4 95.5 96.2  PLT 229  --  207 184 185    Cardiac Enzymes: No results for input(s): "CKTOTAL", "CKMB", "CKMBINDEX", "TROPONINI" in the last 168 hours.  BNP: Invalid input(s): "POCBNP"  CBG: Recent Labs  Lab 01/26/24 1258 01/26/24 1622 01/26/24 2150 01/27/24 0839 01/27/24 1120  GLUCAP 149* 183* 245* 192* 211*    Microbiology: Results for orders placed or performed during the hospital encounter of 05/28/19  SARS Coronavirus 2 (CEPHEID - Performed in [facility] hospital lab), Hosp Order     Status: None   Collection Time: 05/29/19 12:15 AM   Specimen: Nasopharyngeal Swab  Result Value Ref Range Status   SARS Coronavirus 2 NEGATIVE NEGATIVE Final    Comment: (NOTE) If result is NEGATIVE SARS-CoV-2 target nucleic acids are NOT DETECTED. The SARS-CoV-2 RNA is generally detectable in  upper and lower  respiratory specimens during the acute phase of infection. The lowest  concentration of SARS-CoV-2 viral copies this assay can detect is 250  copies / mL. A negative result does not preclude SARS-CoV-2 infection  and should not be used as the sole basis for treatment or other  patient management decisions.  A negative result may occur with  improper specimen collection / handling, submission of specimen other  than nasopharyngeal swab, presence of viral mutation(s) within the  areas targeted by this assay, and inadequate  number of viral copies  (<250 copies / mL). A negative result must be combined with clinical  observations, patient history, and epidemiological information. If result is POSITIVE SARS-CoV-2 target nucleic acids are DETECTED. The SARS-CoV-2 RNA is generally detectable in upper and lower  respiratory specimens dur ing the acute phase of infection.  Positive  results are indicative of active infection with SARS-CoV-2.  Clinical  correlation with patient history and other diagnostic information is  necessary to determine patient infection status.  Positive results do  not rule out bacterial infection or co-infection with other viruses. If result is PRESUMPTIVE POSTIVE SARS-CoV-2 nucleic acids MAY BE PRESENT.   A presumptive positive result was obtained on the submitted specimen  and confirmed on repeat testing.  While 2019 novel coronavirus  (SARS-CoV-2) nucleic acids may be present in the submitted sample  additional confirmatory testing may be necessary for epidemiological  and / or clinical management purposes  to differentiate between  SARS-CoV-2 and other Sarbecovirus currently known to infect humans.  If clinically indicated additional testing with an alternate test  methodology [phone]) is advised. The SARS-CoV-2 RNA is generally  detectable in upper and lower respiratory sp ecimens during the acute  phase of infection. The expected result is Negative. Fact Sheet for Patients:  BoilerBrush.com.cy Fact Sheet for Healthcare Providers: https://pope.com/ This test is not yet approved or cleared by the [place] FDA and has been authorized for detection and/or diagnosis of SARS-CoV-2 by FDA under an Emergency Use Authorization (EUA).  This EUA will remain in effect (meaning this test can be used) for the duration of the COVID-19 declaration under Section 564(b)(1) of the Act, 21 U.S.C. section 360bbb-3(b)(1), unless the authorization  is terminated or revoked sooner. Performed at [facility], [address] [place], [place] [address]   MRSA PCR Screening     Status: None   Collection Time: 05/29/19  4:44 AM   Specimen: Nasal Mucosa; Nasopharyngeal  Result Value Ref Range Status   MRSA by PCR NEGATIVE NEGATIVE Final    Comment:        The GeneXpert MRSA Assay (FDA approved for NASAL specimens only), is one component of a comprehensive MRSA colonization surveillance program. It is not intended to diagnose MRSA infection nor to guide or monitor treatment for MRSA infections. Performed at [facility], [address] [place], [place] [address]     Coagulation Studies: No results for input(s): "LABPROT", "INR" in the last 72 hours.  Urinalysis: No results for input(s): "COLORURINE", "LABSPEC", "PHURINE", "GLUCOSEU", "HGBUR", "BILIRUBINUR", "KETONESUR", "PROTEINUR", "UROBILINOGEN", "NITRITE", "LEUKOCYTESUR" in the last 72 hours.  Invalid input(s): "APPERANCEUR"    Imaging: No results found.    Medications:     aspirin  81 mg Oral BID   Chlorhexidine Gluconate Cloth  6 each Topical Q0600   cyanocobalamin  1,000 mcg Oral Daily   docusate sodium  100 mg Oral BID   doxazosin  8 mg Oral Daily  DULoxetine  30 mg Oral Daily   epoetin alfa-epbx (RETACRIT) injection  4,000 Units Intravenous Q M,W,F-1800   ezetimibe  10 mg Oral Daily   gabapentin  600 mg Oral QHS   insulin aspart  0-5 Units Subcutaneous QHS   insulin aspart  0-9 Units Subcutaneous TID WC   insulin glargine-yfgn  16 Units Subcutaneous QHS   irbesartan  37.5 mg Oral Daily   isosorbide mononitrate  120 mg Oral BID   lactulose  20 g Oral BID   magnesium oxide  400 mg Oral Q0600   multivitamin with minerals  1 tablet Oral Daily   pantoprazole  40 mg Oral Daily   acetaminophen **OR** acetaminophen, alum & mag hydroxide-simeth, baclofen, bisacodyl, diphenhydrAMINE, ondansetron, traMADol  Assessment/ Plan:  [name] is a 63 y.o.  female with past medical conditions including anemia, anxiety, CHF, diabetes, diabetic nephropathy, GERD, hyperlipidemia, legally blind in left eye, and end-stage renal disease on hemodialysis.  Patient arrived to hospital for scheduled operative procedure on the right ankle.  Patient requested overnight stay in hospital to receive PT evaluation and possible help at home.  Patient has been admitted under observation for Ankle fracture [S82.899A]  [facility]/MWF/left aVF/146.0 kg  Anemia of chronic kidney disease Lab Results  Component Value Date   HGB 8.6 (L) 01/26/2024  Hemoglobin decreased, Low dose EPO with ordered dialysis.    2.  End-stage renal disease on hemodialysis.  Dialysis received yesterday, UF 2.8L achieved. Patient signed off 37 mins early, no reason given.  Next treatment scheduled for Wednesday.  3. Diabetes mellitus type II with chronic kidney disease/renal manifestations: insulin dependent. Home regimen includes Mounjaro and lispro. Most recent hemoglobin A1c is 7.3 on 01/22/24.   Primary team to manage SSI  4. Secondary Hyperparathyroidism: with outpatient labs: PTH 290, phosphorus 5.0, calcium 8.4 on 01/12/24.    Lab Results  Component Value Date   CALCIUM 8.5 (L) 01/26/2024   CAION 1.05 (L) 01/22/2024   PHOS 7.1 (H) 01/26/2024  Will continue to monitor bone minerals during this admission.  Currently prescribed calcitriol outpatient.   LOS: 0 Jadarrius Maselli 3/25/20251:14 PM

## 2024-01-27 NOTE — Progress Notes (Signed)
 Gave report to nurse at [facility]. Patient transported via EMS.

## 2024-01-28 ENCOUNTER — Telehealth: Payer: Self-pay | Admitting: Orthopedic Surgery

## 2024-01-28 NOTE — Telephone Encounter (Signed)
 This pt is in need of a post op appointment with Dr. Dallas Schimke in [place]. Please assist.

## 2024-01-28 NOTE — Telephone Encounter (Signed)
 Patient called and a post op appt. She is the nursing home for now til April 12,2025. CB#[phone]

## 2024-01-28 NOTE — Telephone Encounter (Signed)
 Patient left a message on my voicemail requesting a call back to let her know when her appointment with Dr. Dallas Schimke is. I think maybe this is in reference to a post op appointment. Please call patient to discuss.

## 2024-02-04 ENCOUNTER — Ambulatory Visit: Admitting: Orthopedic Surgery

## 2024-02-04 ENCOUNTER — Encounter: Payer: Self-pay | Admitting: Orthopedic Surgery

## 2024-02-04 ENCOUNTER — Other Ambulatory Visit (INDEPENDENT_AMBULATORY_CARE_PROVIDER_SITE_OTHER): Payer: Self-pay

## 2024-02-04 DIAGNOSIS — S82841G Displaced bimalleolar fracture of right lower leg, subsequent encounter for closed fracture with delayed healing: Secondary | ICD-10-CM | POA: Diagnosis not present

## 2024-02-04 NOTE — Progress Notes (Signed)
 Orthopaedic Postop Note  Assessment: Rachel Holmes is a 63 y.o. female s/p ORIF of Right ankle fracture  DOS: 01/22/2024  Plan: Sutures remain in place due to persistent drainage. Well padded short leg cast placed in clinic today Follow-up in clinic in [place] next week. NWB on the operative extremity Continue to take Aspirin 81 mg BID   Follow-up: No follow-ups on file. XR at next visit: Right ankle  Subjective:  Chief Complaint  Patient presents with   Fracture    bimalleolar fracture of right ankle DOS 3/20/205    History of Present Illness: Rachel Holmes is a 63 y.o. female who presents following the above stated procedure.  Surgery was approximately 2 weeks ago in [place].  She was admitted following surgery, discharged to a nursing facility.  She remains in a nursing facility now.  She states her ankle feels better.  She has some itching.  Pain is controlled.  She states that she is returning home this Friday.  Review of Systems: No fevers or chills No numbness or tingling No Chest Pain No shortness of breath   Objective: There were no vitals taken for this visit.  Physical Exam:  Alert and oriented.  No acute distress.  Evaluation of the right ankle demonstrates improved swelling.  Surgical incision is clean and intact.  There is some serosanguineous drainage.  No purulence is appreciated.  Tolerates gentle range of motion of the ankle.  Toes warm well-perfused.  Sensation intact to the dorsum of the foot, although it is decreased overall.  IMAGING: I personally ordered and reviewed the following images   X-rays of the right ankle were obtained in clinic today.  These compared to prior x-rays.  There is been no interval displacement of the medial lateral fractures.  There is impaction of the joint surface, which is not visible on the lateral projection.  Screws not backing out.  No hardware failure.  Impression: Right ankle fracture following  ORIF, and unchanged alignment   Oliver Barre, MD 02/04/2024 10:28 AM

## 2024-02-11 ENCOUNTER — Encounter: Payer: Self-pay | Admitting: Orthopedic Surgery

## 2024-02-12 ENCOUNTER — Ambulatory Visit: Admitting: Orthopedic Surgery

## 2024-02-12 ENCOUNTER — Encounter: Payer: Self-pay | Admitting: Orthopedic Surgery

## 2024-02-12 DIAGNOSIS — S82841G Displaced bimalleolar fracture of right lower leg, subsequent encounter for closed fracture with delayed healing: Secondary | ICD-10-CM

## 2024-02-12 MED ORDER — SULFAMETHOXAZOLE-TRIMETHOPRIM 400-80 MG PO TABS: 1.0000 | ORAL_TABLET | Freq: Two times a day (BID) | ORAL | 0 refills | Status: AC

## 2024-02-12 NOTE — Patient Instructions (Signed)
 You have been fitted for a walking boot in clinic today.    Please continue to avoid putting weight on your right leg.  Wear the boot at all times.  Once per day, I want you to remove the boot and clean your ankle with soap and water.  Dry the incisions well, and then put your boot back on.   I would like for you to start taking some antibiotics to prevent an infection.   Follow-up in 2 weeks.

## 2024-02-12 NOTE — Progress Notes (Signed)
 Orthopaedic Postop Note  Assessment: Rachel Holmes is a 63 y.o. female s/p ORIF of Right ankle fracture  DOS: 01/22/2024  Plan: Incisions are improving.  Some persistent drainage laterally.  Provided a prescription for Bactrim to help prevent an infection.  Recommend transition to a walking boot, but to remain NWB.  Clean incisions daily, dry well and then back into a walking boot.  Follow up in 2 weeks for XR and incision check    Follow-up: Return in about 2 weeks (around 02/26/2024). XR at next visit: Right ankle  Subjective:  No chief complaint on file.   History of Present Illness: Rachel Holmes is a 63 y.o. female who presents following the above stated procedure.  Surgery was approximately 3 weeks ago. She is now at home.  She is bearing weight on the left leg only.  No fevers or chills.  Her ankle feels better.    Review of Systems: No fevers or chills No numbness or tingling No Chest Pain No shortness of breath   Objective: There were no vitals taken for this visit.  Physical Exam:  Alert and oriented.  No acute distress.  Right ankle with some residual swelling.  Medial incision with minimal drainage.  Lateral incision with some residual drainage.  No purulence.  No odor.  Toes are WWP.  Active motion intact in the EHL/TA.    IMAGING: I personally ordered and reviewed the following images  No new imaging obtained today   Oliver Barre, MD 02/12/2024 9:50 AM

## 2024-02-25 ENCOUNTER — Telehealth: Payer: Self-pay

## 2024-02-25 NOTE — Telephone Encounter (Signed)
  Called patient to let her know to have x ray  before appointment and her voice mail haws not been set up  called her 2 times

## 2024-02-26 ENCOUNTER — Telehealth: Payer: Self-pay

## 2024-02-26 ENCOUNTER — Ambulatory Visit
Admission: RE | Admit: 2024-02-26 | Discharge: 2024-02-26 | Disposition: A | Discharge status: Home / Self Care | Source: Ambulatory Visit | Attending: Orthopedic Surgery | Admitting: Orthopedic Surgery

## 2024-02-26 ENCOUNTER — Ambulatory Visit: Admitting: Orthopedic Surgery

## 2024-02-26 ENCOUNTER — Ambulatory Visit
Admission: RE | Admit: 2024-02-26 | Discharge: 2024-02-26 | Disposition: A | Discharge status: Home / Self Care | Attending: Orthopedic Surgery | Admitting: Orthopedic Surgery

## 2024-02-26 ENCOUNTER — Encounter: Payer: Self-pay | Admitting: Orthopedic Surgery

## 2024-02-26 DIAGNOSIS — S82841G Displaced bimalleolar fracture of right lower leg, subsequent encounter for closed fracture with delayed healing: Secondary | ICD-10-CM | POA: Diagnosis present

## 2024-02-26 MED ORDER — SULFAMETHOXAZOLE-TRIMETHOPRIM 400-80 MG PO TABS: 1.0000 | ORAL_TABLET | Freq: Two times a day (BID) | ORAL | 0 refills | Status: AC

## 2024-02-26 NOTE — Progress Notes (Signed)
 Orthopaedic Postop Note  Assessment: Rachel Holmes is a 63 y.o. female s/p ORIF of Right ankle fracture  DOS: 01/22/2024  Plan: Drainage is much better.  Sutures were removed.  Radiographs remained stable.  She still has some tenderness medial and lateral.  No fevers or chills.  There is a small area of wound breakdown laterally, with mild serous drainage.  Will repeat prescription for Bactrim , and plan to see her back in approximately 2 weeks for a wound check.  She is to remain nonweightbearing.   Follow-up: Return in about 2 weeks (around 03/11/2024). XR at next visit: Right ankle  Subjective:  Chief Complaint  Patient presents with   Ankle Pain    Follow up right ankle     History of Present Illness: Rachel Holmes is a 63 y.o. female who returns following the above stated procedure.  Surgery was approximately 5 weeks ago.  She is at home.  She is doing well overall.  She states her ankle feels better.  She completed the antibiotics, without issues.  She has very little drainage at this time.  She remains nonweightbearing, although she states she feels as though there is glass in her right heel.  She was post to see a podiatrist, but did not present for this appointment.  Review of Systems: No fevers or chills No numbness or tingling No Chest Pain No shortness of breath   Objective: There were no vitals taken for this visit.  Physical Exam:  Alert and oriented.  No acute distress.  Right ankle with some mild swelling.  Medial incision is clean, dry and intact.  No surrounding erythema or drainage.  Lateral surgical incision with some mild wound breakdown in the mid aspect.  Mild serous drainage.  No purulence is appreciated.  No odor.  Toes are warm and well-perfused.   IMAGING: I personally ordered and reviewed the following images  No new imaging obtained today   Tonita Frater, MD 02/26/2024 12:26 PM

## 2024-02-26 NOTE — Telephone Encounter (Signed)
 Rachel Holmes

## 2024-02-26 NOTE — Patient Instructions (Signed)
 No weight bearing  Continue to wear the boot  Repeat antibiotics  Ok to wash the ankle.  DO not scrub.  Pat incisions dry  Follow up in 2 weeks

## 2024-03-10 ENCOUNTER — Ambulatory Visit (INDEPENDENT_AMBULATORY_CARE_PROVIDER_SITE_OTHER): Admitting: Orthopedic Surgery

## 2024-03-10 ENCOUNTER — Encounter: Payer: Self-pay | Admitting: Orthopedic Surgery

## 2024-03-10 DIAGNOSIS — T8130XA Disruption of wound, unspecified, initial encounter: Secondary | ICD-10-CM

## 2024-03-10 DIAGNOSIS — S82841G Displaced bimalleolar fracture of right lower leg, subsequent encounter for closed fracture with delayed healing: Secondary | ICD-10-CM

## 2024-03-10 NOTE — Patient Instructions (Signed)
 Wet-to-dry dressings   Instructions 1.  Wash your hands 2.  Open the gauze fully. 3.  Wet the gauze with the saline or contact solution. 4.  Squeeze the gauze until it is almost dry. 5.  Pack the gauze ONLY into the open area of the wound. 6.  Cover the wound with dry gauze. 7.  Use the tape, Ace wrap or gauze wrap to secure the dressing over the wound. 8.  Repeat this process twice a day.  In the morning when you wake up, and before you go to bed.

## 2024-03-10 NOTE — Progress Notes (Signed)
 Orthopaedic Postop Note  Assessment: Rachel Holmes is a 63 y.o. female s/p ORIF of Right ankle fracture  DOS: 01/22/2024  Lateral wound dehiscence  Plan: Minimal drainage appreciated in clinic today.  However, she has developed some dehiscence over the lateral incision.  There is some fibrinous tissue.  No hardware is visible.  At this point, I have recommended wet-to-dry dressings.  She will do this twice per day.  We do not need to continue antibiotics.  If she has further issues, or notices worsening drainage, she will contact the clinic.  Otherwise I will see her in 2 weeks.   Follow-up: Return in about 2 weeks (around 03/24/2024). XR at next visit: Right ankle  Subjective:  Chief Complaint  Patient presents with   Ankle Pain    FU right ankle  wound has opened up but patient says no pain     History of Present Illness: Rachel Holmes is a 63 y.o. female who returns following the above stated procedure.  Surgery was approximately 6-7 weeks ago.  She is denying pain.  She has no fevers or chills.  The drainage has improved.  However, the wound has gotten bigger.  She states that she has not been weightbearing.  She continues to wear her boot.   Review of Systems: No fevers or chills No numbness or tingling No Chest Pain No shortness of breath   Objective: There were no vitals taken for this visit.  Physical Exam:  Alert and oriented.  No acute distress.  Mild swelling of the right ankle.  No purulence is appreciated.  No drainage medial or lateral.  Medial incision is healing appropriately.  There is some dehiscence laterally.    IMAGING: I personally ordered and reviewed the following images  No new imaging obtained today   Tonita Frater, MD 03/10/2024 2:18 PM

## 2024-03-23 ENCOUNTER — Inpatient Hospital Stay

## 2024-03-23 ENCOUNTER — Inpatient Hospital Stay: Attending: Oncology | Admitting: Oncology

## 2024-03-23 ENCOUNTER — Encounter: Payer: Self-pay | Admitting: Oncology

## 2024-03-23 VITALS — BP 140/58 | Temp 96.8°F | Resp 18 | Ht 68.0 in | Wt 314.0 lb

## 2024-03-23 DIAGNOSIS — N186 End stage renal disease: Secondary | ICD-10-CM | POA: Insufficient documentation

## 2024-03-23 DIAGNOSIS — Z992 Dependence on renal dialysis: Secondary | ICD-10-CM | POA: Insufficient documentation

## 2024-03-23 DIAGNOSIS — D631 Anemia in chronic kidney disease: Secondary | ICD-10-CM | POA: Insufficient documentation

## 2024-03-23 DIAGNOSIS — Z803 Family history of malignant neoplasm of breast: Secondary | ICD-10-CM | POA: Diagnosis not present

## 2024-03-23 DIAGNOSIS — N189 Chronic kidney disease, unspecified: Secondary | ICD-10-CM

## 2024-03-23 LAB — CBC (CANCER CENTER ONLY)
HCT: 34 % — ABNORMAL LOW (ref 36.0–46.0)
Hemoglobin: 10.5 g/dL — ABNORMAL LOW (ref 12.0–15.0)
MCH: 28.7 pg (ref 26.0–34.0)
MCHC: 30.9 g/dL (ref 30.0–36.0)
MCV: 92.9 fL (ref 80.0–100.0)
Platelet Count: 194 10*3/uL (ref 150–400)
RBC: 3.66 MIL/uL — ABNORMAL LOW (ref 3.87–5.11)
RDW: 14.4 % (ref 11.5–15.5)
WBC Count: 6.3 10*3/uL (ref 4.0–10.5)
nRBC: 0 % (ref 0.0–0.2)

## 2024-03-23 LAB — IRON AND TIBC
Iron: 43 ug/dL (ref 28–170)
Saturation Ratios: 24 % (ref 10.4–31.8)
TIBC: 179 ug/dL — ABNORMAL LOW (ref 250–450)
UIBC: 136 ug/dL

## 2024-03-23 LAB — FOLATE: Folate: 12.2 ng/mL (ref >5.9)

## 2024-03-23 LAB — LACTATE DEHYDROGENASE: LDH: 159 U/L (ref 98–192)

## 2024-03-23 LAB — FERRITIN: Ferritin: 751 ng/mL — ABNORMAL HIGH (ref 11–307)

## 2024-03-23 LAB — VITAMIN B12: Vitamin B-12: 730 pg/mL (ref 180–914)

## 2024-03-23 NOTE — Progress Notes (Signed)
 [facility]  [phone] [phone]  ID: Rachel Holmes OB: 04-10-1961  MR#: [id number]  AOZ#:[id number]  Patient Care Team: Alexander Anes, MD as PCP - General (Family Medicine) Jerelene Monday Deadra Everts, MD as PCP - Cardiology (Cardiology) Shellie Dials, MD as Consulting Physician (Hematology and Oncology)  CHIEF COMPLAINT: Anemia associated with chronic renal failure.  INTERVAL HISTORY: Patient is a 63 year old female with end-stage renal disease on dialysis Mondays, Wednesdays, and Fridays who was noted to have a persistently decreased hemoglobin.  She currently feels well and is asymptomatic.  She has no neurologic complaints.  She denies any recent fevers or illnesses.  She has a good appetite and denies weight loss.  She has no chest pain, shortness of breath, cough, or hemoptysis.  She denies any nausea, vomiting, constipation, or diarrhea.  She has no melena or hematochezia.  Patient offers no further specific complaints today.  REVIEW OF SYSTEMS:   Review of Systems  Constitutional: Negative.  Negative for fever, malaise/fatigue and weight loss.  Respiratory: Negative.  Negative for cough, hemoptysis and shortness of breath.   Cardiovascular: Negative.  Negative for chest pain.  Gastrointestinal: Negative.  Negative for abdominal pain, blood in stool and melena.  Genitourinary: Negative.   Musculoskeletal: Negative.  Negative for back pain.  Skin: Negative.  Negative for rash.  Neurological: Negative.  Negative for dizziness, focal weakness, weakness and headaches.  Psychiatric/Behavioral: Negative.  The patient is not nervous/anxious.     As per HPI. Otherwise, a complete review of systems is negative.  PAST MEDICAL HISTORY: Past Medical History:  Diagnosis Date   Allergic rhinitis    Allergy    Anemia    Anxiety    Aortic stenosis 09/13/2016   a.) TTE 09/13/2016: mild AS --> MPG 11.2 mmHg. b.) TTE 12/27/2020: EF 55-60%; no AS  --> MPG 8 mmHg.   Aortic valve endocarditis 12/25/2018   a.) TTE 12/25/2018: 0.75 x 1 cm AV mass. b.) TEE 12/28/2018: small shaggy mobile density on aortic side of RIGHT coronary cusp consistent with vegetation.   Arthritis    Back pain, chronic    Bimalleolar fracture of right ankle 01/2024   Bradycardia    Breast mass    Patient can no longer palpate specific masses but showed tech general area of concern   Charcot's joint of ankle, left    CHF (congestive heart failure) (HCC) 07/09/2007   a.) TTE 07/09/2007: EF 50%; G1DD. b.) TTE 09/13/2016: EF 55%; G2DD. c.) TTE 12/27/2020: EF 55-60%; G2DD; GLS -15.1%.; d.) TTE 09/04/2023: EF 50-55%, G1DD, mild LAE, mild MR, mod AoV calc   Complication of anesthesia    a.) PONV. b.) Delayed emergence   Constipation    Diabetic nephropathy (HCC)    DVT (deep venous thrombosis) (HCC)    Dyspnea    ESRD (end stage renal disease) on dialysis [facility])    Family history of adverse reaction to anesthesia    a.) Sisters x 2 with (+) delayed emergence.   GERD (gastroesophageal reflux disease)    Heart murmur    HLD (hyperlipidemia)    Hyperparathyroidism (HCC)    Hypertension    Legally blind in left eye, as defined in [place]     Lymphedema    Mild pulmonary hypertension (HCC) 09/13/2016   a.) TTE 09/13/2016: EF 55%; RVSP 45 mmHg. b.) TTE 12/25/2018: EF 55-60%; RVSP 35.5 mmHg.   Obesity    Onychomycosis    PONV (postoperative nausea and vomiting)  S/P BKA (below knee amputation) unilateral, left (HCC)    Sepsis (HCC) 12/2018   a.) group G streptococcal bacteremia with AV endocarditis and LLE soft tissue infection.   T2DM (type 2 diabetes mellitus) (HCC)     PAST SURGICAL HISTORY: Past Surgical History:  Procedure Laterality Date   A/V FISTULAGRAM Left 06/18/2022   Procedure: A/V Fistulagram;  Surgeon: Jackquelyn Mass, MD;  Location: [facility] INVASIVE CV LAB;  Service: Cardiovascular;  Laterality: Left;   A/V FISTULAGRAM Left 08/13/2022   Procedure:  A/V Fistulagram;  Surgeon: Jackquelyn Mass, MD;  Location: [facility] INVASIVE CV LAB;  Service: Cardiovascular;  Laterality: Left;   ABDOMINAL HYSTERECTOMY     AMPUTATION Left 05/05/2019   Procedure: AMPUTATION BELOW KNEE;  Surgeon: Jackquelyn Mass, MD;  Location: [facility] ORS;  Service: Vascular;  Laterality: Left;   AV FISTULA PLACEMENT Right 09/19/2021   Procedure: ARTERIOVENOUS (AV) FISTULA CREATION (BRACHIAL CEPHALIC);  Surgeon: Jackquelyn Mass, MD;  Location: [facility] ORS;  Service: Vascular;  Laterality: Right;   AV FISTULA PLACEMENT Left 11/09/2021   Procedure: ARTERIOVENOUS (AV) FISTULA CREATION ( BRACHIAL CEPHALIC );  Surgeon: Jackquelyn Mass, MD;  Location: [facility] ORS;  Service: Vascular;  Laterality: Left;   BREAST BIOPSY Left 2014   FNA 12:00 position - Negative   CATARACT EXTRACTION Bilateral    DIALYSIS/PERMA CATHETER INSERTION N/A 03/28/2022   Procedure: DIALYSIS/PERMA CATHETER INSERTION;  Surgeon: Celso College, MD;  Location: [facility] INVASIVE CV LAB;  Service: Cardiovascular;  Laterality: N/A;   DIALYSIS/PERMA CATHETER REMOVAL N/A 12/09/2022   Procedure: DIALYSIS/PERMA CATHETER REMOVAL;  Surgeon: Celso College, MD;  Location: [facility] INVASIVE CV LAB;  Service: Cardiovascular;  Laterality: N/A;   EYE SURGERY Left 2007   removed a lens, no lens implanted   IR FLUORO GUIDE CV LINE RIGHT  12/29/2018   IR REMOVAL TUN CV CATH W/O FL  02/19/2019   IR US  GUIDE VASC ACCESS RIGHT  12/29/2018   ORIF ANKLE FRACTURE Right 01/22/2024   Procedure: OPEN REDUCTION INTERNAL FIXATION (ORIF) ANKLE FRACTURE;  Surgeon: Tonita Frater, MD;  Location: [facility] ORS;  Service: Orthopedics;  Laterality: Right;   REVISON OF ARTERIOVENOUS FISTULA Left 10/16/2022   Procedure: REVISON OF ARTERIOVENOUS FISTULA;  Surgeon: Jackquelyn Mass, MD;  Location: [facility] ORS;  Service: Vascular;  Laterality: Left;   TEE WITHOUT CARDIOVERSION N/A 12/28/2018   Procedure: TRANSESOPHAGEAL ECHOCARDIOGRAM (TEE);  Surgeon: Jacqueline Matsu, MD;  Location: [facility] ENDOSCOPY;  Service: Cardiovascular;  Laterality: N/A;    FAMILY HISTORY: Family History  Problem Relation Age of Onset   Breast cancer Sister 55   Diabetes Sister    Diabetes Mother    Hypertension Mother    Hyperlipidemia Mother    Eating disorder Mother    Obesity Mother     ADVANCED DIRECTIVES (Y/N):  N  HEALTH MAINTENANCE: Social History   Tobacco Use   Smoking status: Never   Smokeless tobacco: Never  Vaping Use   Vaping status: Never Used  Substance Use Topics   Alcohol use: No   Drug use: Never     Colonoscopy:  PAP:  Bone density:  Lipid panel:  Allergies  Allergen Reactions   Nifedipine Rash   Penicillins Hives, Shortness Of Breath and Swelling   Statins Shortness Of Breath    Wheezing   Hydralazine  Itching   Hydrocodone  Itching and Nausea Only   Ivp Dye [Iodinated Contrast Media]     ESRD   Metoprolol Diarrhea and Other (See  Comments)    Pt states "make my heart race"   Oxycodone  Nausea Only   Codeine Nausea Only   Ibuprofen  Other (See Comments)    Raises blood pressure    Current Outpatient Medications  Medication Sig Dispense Refill   acetaminophen  (TYLENOL ) 500 MG tablet Take 2 tablets (1,000 mg total) by mouth every 6 (six) hours as needed. 100 tablet 2   albuterol  (PROVENTIL  HFA;VENTOLIN  HFA) 108 (90 Base) MCG/ACT inhaler Inhale 1-2 puffs into the lungs every 6 (six) hours as needed for wheezing or shortness of breath.     calcitRIOL (ROCALTROL) 0.25 MCG capsule Take 0.25 mcg by mouth every Monday, Wednesday, and Friday with hemodialysis.     cetirizine (ZYRTEC) 10 MG tablet Take 10 mg by mouth at bedtime.     clobetasol cream (TEMOVATE) 0.05 % Apply 1 application  topically 2 (two) times daily as needed (rash).     cloNIDine  (CATAPRES ) 0.3 MG tablet Take 1 tablet (0.3 mg total) by mouth 2 (two) times daily. 180 tablet 3   Continuous Blood Gluc Receiver (FREESTYLE LIBRE 14 DAY READER) DEVI USE AS DIRECTED 1 each 0    docusate sodium  (COLACE) 100 MG capsule Take 1 capsule (100 mg total) by mouth 2 (two) times daily. 10 capsule 0   doxazosin  (CARDURA ) 8 MG tablet Take 8 mg by mouth daily.     DULoxetine  (CYMBALTA ) 30 MG capsule Take 30 mg by mouth at bedtime.     ezetimibe  (ZETIA ) 10 MG tablet Take 1 tablet (10 mg total) by mouth daily. 90 tablet 3   gabapentin  (NEURONTIN ) 600 MG tablet Take 600 mg by mouth at bedtime.     glucosamine-chondroitin 500-400 MG tablet Take 1 tablet by mouth in the morning and at bedtime.     hydrOXYzine (ATARAX) 25 MG tablet Take 25 mg by mouth every 6 (six) hours as needed for itching.     insulin  degludec (TRESIBA) 100 UNIT/ML FlexTouch Pen Inject 14 Units into the skin at bedtime.     isosorbide  mononitrate (IMDUR ) 120 MG 24 hr tablet Take 1 tablet (120 mg total) by mouth in the morning and at bedtime.     lactulose  (CHRONULAC ) 10 GM/15ML solution Take 15 mLs by mouth daily as needed for moderate constipation.     Lancets (ONETOUCH ULTRASOFT) lancets Used to check blood sugars four times daily. 200 each 12   LYUMJEV  KWIKPEN 100 UNIT/ML KwikPen Inject 10-20 Units into the skin in the morning and at bedtime. With meals     Magnesium  400 MG CAPS Take 400 mg by mouth at bedtime.     MOUNJARO 7.5 MG/0.5ML Pen Inject 10 mg into the skin once a week.     Multiple Vitamin (MULTIVITAMIN WITH MINERALS) TABS tablet Take 1 tablet by mouth daily.     olmesartan (BENICAR) 40 MG tablet Take 40 mg by mouth daily.     omeprazole (PRILOSEC) 40 MG capsule Take 40 mg by mouth 2 (two) times daily with breakfast and lunch.     ONETOUCH VERIO test strip USE TO CHECK BLOOD SUGAR TWICE DAILY 100 each 0   torsemide  (DEMADEX ) 100 MG tablet Take 1 tablet (100 mg total) by mouth 2 (two) times daily.     vitamin B-12 (CYANOCOBALAMIN ) 1000 MCG tablet Take 1,000 mcg by mouth daily.     No current facility-administered medications for this visit.    OBJECTIVE: Vitals:   03/23/24 1447  BP: (!) 140/58   Resp: 18  Temp: Aaron Aas)  96.8 F (36 C)  SpO2: 95%     Body mass index is 47.74 kg/m.    ECOG FS:0 - Asymptomatic  General: Well-developed, well-nourished, no acute distress.  Sitting in a wheelchair. Eyes: Pink conjunctiva, anicteric sclera. HEENT: Normocephalic, moist mucous membranes. Lungs: No audible wheezing or coughing. Heart: Regular rate and rhythm. Abdomen: Soft, nontender, no obvious distention. Musculoskeletal: No edema, cyanosis, or clubbing. Neuro: Alert, answering all questions appropriately. Cranial nerves grossly intact. Skin: No rashes or petechiae noted. Psych: Normal affect. Lymphatics: No cervical, calvicular, axillary or inguinal LAD.   LAB RESULTS:  Lab Results  Component Value Date   NA 133 (L) 01/26/2024   K 4.7 01/26/2024   CL 95 (L) 01/26/2024   CO2 26 01/26/2024   GLUCOSE 166 (H) 01/26/2024   BUN 73 (H) 01/26/2024   CREATININE 8.82 (H) 01/26/2024   CALCIUM 8.5 (L) 01/26/2024   PROT 7.7 02/16/2023   ALBUMIN  2.8 (L) 01/26/2024   AST 23 02/16/2023   ALT 15 02/16/2023   ALKPHOS 92 02/16/2023   BILITOT 0.5 02/16/2023   GFRNONAA 5 (L) 01/26/2024   GFRAA 17 (L) 05/28/2019    Lab Results  Component Value Date   WBC 6.3 03/23/2024   NEUTROABS 4.6 10/31/2021   HGB 10.5 (L) 03/23/2024   HCT 34.0 (L) 03/23/2024   MCV 92.9 03/23/2024   PLT 194 03/23/2024     STUDIES: DG Ankle Complete Right Result Date: 02/29/2024 CLINICAL DATA:  Closed trimalleolar fracture right ankle with delayed healing. Subsequent encounter. EXAM: RIGHT ANKLE - COMPLETE 3+ VIEW COMPARISON:  Right ankle radiographs 02/04/2024, 12/18/2023, 12/03/2023, 11/12/2023, FINDINGS: Redemonstration of lateral plate and screw fixation of the distal fibula with two lung distal tibiofibular syndesmosis fixation screws. Redemonstration of two medial malleolar fixation screws. Unchanged alignment. Mild healing sclerosis within the known distal fibular metadiaphyseal and superior medial malleolar  fractures with persistent fracture line lucency. There is mild medial downsloping of the talar dome. Minor mobile tibiotalar joint space narrowing, unchanged. Moderate plantar and posterior calcaneal heel spurs. Moderate midfoot osteoarthritis. Moderate atherosclerotic calcifications. IMPRESSION: Unchanged alignment of the distal fibular and medial malleolar fractures status post ORIF. Mild healing sclerosis within the known distal fibular metadiaphyseal and superior medial malleolar fractures with persistent fracture line lucency. Electronically Signed   By: Bertina Broccoli M.D.   On: 02/29/2024 18:36    ASSESSMENT: Anemia associated with chronic renal failure.  PLAN:    Anemia associated with chronic renal failure: Patient's hemoglobin is decreased, but essentially stable at 10.5.  She reports that she receives IV iron with dialysis, but does not believe she gets erythropoietin injections.  All of her laboratory work from today is pending at time of dictation including iron panel, hemolysis labs, folate, and B12.  No intervention is needed at this time.  Follow-up will be based on laboratory results. Chronic renal failure: Continue dialysis as per nephrology on Mondays, Wednesdays, and Fridays.  I spent a total of 45 minutes reviewing chart data, face-to-face evaluation with the patient, counseling and coordination of care as detailed above.  Patient expressed understanding and was in agreement with this plan. She also understands that She can call clinic at any time with any questions, concerns, or complaints.     Shellie Dials, MD   03/23/2024 3:56 PM

## 2024-03-24 LAB — HAPTOGLOBIN: Haptoglobin: 184 mg/dL (ref 37–355)

## 2024-03-25 ENCOUNTER — Encounter: Payer: Self-pay | Admitting: Orthopedic Surgery

## 2024-03-25 ENCOUNTER — Ambulatory Visit (INDEPENDENT_AMBULATORY_CARE_PROVIDER_SITE_OTHER): Admitting: Orthopedic Surgery

## 2024-03-25 DIAGNOSIS — T8130XA Disruption of wound, unspecified, initial encounter: Secondary | ICD-10-CM

## 2024-03-25 DIAGNOSIS — S82841G Displaced bimalleolar fracture of right lower leg, subsequent encounter for closed fracture with delayed healing: Secondary | ICD-10-CM

## 2024-03-25 NOTE — Patient Instructions (Signed)
 Continue with wet to dry dressings until we can get wound care referral scheduled.

## 2024-03-26 NOTE — Progress Notes (Signed)
 Orthopaedic Postop Note  Assessment: Rachel Holmes is a 63 y.o. female s/p ORIF of Right ankle fracture  DOS: 01/22/2024  Lateral wound dehiscence  Plan: Wound dehiscence is healthy appearing.  No fluctuance.  I am not concerned about an infection at this time.  However, the wound appears to be a little larger in clinic today.  There is no exposed bone or hardware.  We will place a referral to wound care, and hope to get this started immediately.  Her pain is controlled.  Continue with wet-to-dry dressings in the interim.  Continue to wear the boot, and nonweightbearing.  I have advised her that if this continues to breakdown, and does not heal, she may ultimately require an amputation, as her healing potential is very low.   Follow-up: Return in about 2 weeks (around 04/08/2024). XR at next visit: Right ankle  Subjective:  Chief Complaint  Patient presents with   Ankle Pain    Follow up right ankle  has opened wider and draining     History of Present Illness: Rachel Holmes is a 63 y.o. female who returns following the above stated procedure.  Surgery was approximately 2 months ago.  Pain is controlled.  She has been completing wet-to-dry dressings.  She feels as though the wound dehiscence has gotten bigger.  She denies fevers or chills.  She has completed antibiotics.   Review of Systems: No fevers or chills No numbness or tingling No Chest Pain No shortness of breath   Objective: There were no vitals taken for this visit.  Physical Exam:  Alert and oriented.  No acute distress.  Swelling remained stable.  Lateral wound with area of dehiscence.  There is no exposed bone.  No exposed hardware.  Healthy appearing overall.  There is red granulation tissue through most of the area of dehiscence.  Medial ankle has healed.  She is tolerating gentle range of motion.    IMAGING: I personally ordered and reviewed the following images  No new imaging obtained  today   Tonita Frater, MD 03/26/2024 1:36 PM

## 2024-03-30 ENCOUNTER — Emergency Department

## 2024-03-30 ENCOUNTER — Observation Stay
Admission: EM | Admit: 2024-03-30 | Discharge: 2024-04-01 | Disposition: A | Discharge status: Home Health | Attending: Family Medicine | Admitting: Family Medicine

## 2024-03-30 DIAGNOSIS — I5032 Chronic diastolic (congestive) heart failure: Secondary | ICD-10-CM | POA: Diagnosis present

## 2024-03-30 DIAGNOSIS — E1121 Type 2 diabetes mellitus with diabetic nephropathy: Secondary | ICD-10-CM | POA: Diagnosis not present

## 2024-03-30 DIAGNOSIS — Z9889 Other specified postprocedural states: Secondary | ICD-10-CM

## 2024-03-30 DIAGNOSIS — Z6841 Body Mass Index (BMI) 40.0 and over, adult: Secondary | ICD-10-CM | POA: Diagnosis not present

## 2024-03-30 DIAGNOSIS — T8131XA Disruption of external operation (surgical) wound, not elsewhere classified, initial encounter: Principal | ICD-10-CM | POA: Insufficient documentation

## 2024-03-30 DIAGNOSIS — Z89512 Acquired absence of left leg below knee: Secondary | ICD-10-CM | POA: Diagnosis not present

## 2024-03-30 DIAGNOSIS — I132 Hypertensive heart and chronic kidney disease with heart failure and with stage 5 chronic kidney disease, or end stage renal disease: Secondary | ICD-10-CM | POA: Insufficient documentation

## 2024-03-30 DIAGNOSIS — E785 Hyperlipidemia, unspecified: Secondary | ICD-10-CM | POA: Diagnosis not present

## 2024-03-30 DIAGNOSIS — T8130XA Disruption of wound, unspecified, initial encounter: Secondary | ICD-10-CM | POA: Diagnosis present

## 2024-03-30 DIAGNOSIS — Z87828 Personal history of other (healed) physical injury and trauma: Secondary | ICD-10-CM | POA: Insufficient documentation

## 2024-03-30 DIAGNOSIS — E1122 Type 2 diabetes mellitus with diabetic chronic kidney disease: Secondary | ICD-10-CM | POA: Insufficient documentation

## 2024-03-30 DIAGNOSIS — Z794 Long term (current) use of insulin: Secondary | ICD-10-CM | POA: Insufficient documentation

## 2024-03-30 DIAGNOSIS — L089 Local infection of the skin and subcutaneous tissue, unspecified: Secondary | ICD-10-CM | POA: Diagnosis not present

## 2024-03-30 DIAGNOSIS — Z86718 Personal history of other venous thrombosis and embolism: Secondary | ICD-10-CM | POA: Insufficient documentation

## 2024-03-30 DIAGNOSIS — D631 Anemia in chronic kidney disease: Secondary | ICD-10-CM

## 2024-03-30 DIAGNOSIS — N185 Chronic kidney disease, stage 5: Secondary | ICD-10-CM

## 2024-03-30 DIAGNOSIS — G8929 Other chronic pain: Secondary | ICD-10-CM | POA: Insufficient documentation

## 2024-03-30 DIAGNOSIS — N186 End stage renal disease: Secondary | ICD-10-CM | POA: Diagnosis not present

## 2024-03-30 DIAGNOSIS — T8149XA Infection following a procedure, other surgical site, initial encounter: Secondary | ICD-10-CM | POA: Diagnosis present

## 2024-03-30 DIAGNOSIS — G894 Chronic pain syndrome: Secondary | ICD-10-CM | POA: Diagnosis present

## 2024-03-30 DIAGNOSIS — I739 Peripheral vascular disease, unspecified: Secondary | ICD-10-CM | POA: Diagnosis not present

## 2024-03-30 DIAGNOSIS — Z7982 Long term (current) use of aspirin: Secondary | ICD-10-CM | POA: Insufficient documentation

## 2024-03-30 DIAGNOSIS — I10 Essential (primary) hypertension: Secondary | ICD-10-CM | POA: Diagnosis present

## 2024-03-30 DIAGNOSIS — E1151 Type 2 diabetes mellitus with diabetic peripheral angiopathy without gangrene: Secondary | ICD-10-CM

## 2024-03-30 DIAGNOSIS — Z992 Dependence on renal dialysis: Secondary | ICD-10-CM | POA: Diagnosis not present

## 2024-03-30 LAB — LACTIC ACID, PLASMA: Lactic Acid, Venous: 1 mmol/L (ref 0.5–1.9)

## 2024-03-30 LAB — CBC WITH DIFFERENTIAL/PLATELET
Abs Immature Granulocytes: 0.02 10*3/uL (ref 0.00–0.07)
Basophils Absolute: 0 10*3/uL (ref 0.0–0.1)
Basophils Relative: 1 %
Eosinophils Absolute: 0.1 10*3/uL (ref 0.0–0.5)
Eosinophils Relative: 2 %
HCT: 33.6 % — ABNORMAL LOW (ref 36.0–46.0)
Hemoglobin: 10.3 g/dL — ABNORMAL LOW (ref 12.0–15.0)
Immature Granulocytes: 0 %
Lymphocytes Relative: 38 %
Lymphs Abs: 2.7 10*3/uL (ref 0.7–4.0)
MCH: 28.7 pg (ref 26.0–34.0)
MCHC: 30.7 g/dL (ref 30.0–36.0)
MCV: 93.6 fL (ref 80.0–100.0)
Monocytes Absolute: 0.7 10*3/uL (ref 0.1–1.0)
Monocytes Relative: 9 %
Neutro Abs: 3.6 10*3/uL (ref 1.7–7.7)
Neutrophils Relative %: 50 %
Platelets: 169 10*3/uL (ref 150–400)
RBC: 3.59 MIL/uL — ABNORMAL LOW (ref 3.87–5.11)
RDW: 14 % (ref 11.5–15.5)
WBC: 7.2 10*3/uL (ref 4.0–10.5)
nRBC: 0 % (ref 0.0–0.2)

## 2024-03-30 LAB — BASIC METABOLIC PANEL WITH GFR
Anion gap: 12 (ref 5–15)
BUN: 41 mg/dL — ABNORMAL HIGH (ref 8–23)
CO2: 24 mmol/L (ref 22–32)
Calcium: 8.6 mg/dL — ABNORMAL LOW (ref 8.9–10.3)
Chloride: 98 mmol/L (ref 98–111)
Creatinine, Ser: 5.66 mg/dL — ABNORMAL HIGH (ref 0.44–1.00)
GFR, Estimated: 8 mL/min — ABNORMAL LOW (ref >60)
Glucose, Bld: 154 mg/dL — ABNORMAL HIGH (ref 70–99)
Potassium: 4 mmol/L (ref 3.5–5.1)
Sodium: 134 mmol/L — ABNORMAL LOW (ref 135–145)

## 2024-03-30 LAB — SEDIMENTATION RATE: Sed Rate: 63 mm/h — ABNORMAL HIGH (ref 0–30)

## 2024-03-30 MED ORDER — SODIUM CHLORIDE 0.9 % IV SOLN
2.0000 g | Freq: Once | INTRAVENOUS | Status: AC
  Administered 2024-03-30: 2 g via INTRAVENOUS
  Filled 2024-03-30: qty 20

## 2024-03-30 NOTE — ED Provider Notes (Signed)
 [facility] Provider Note    None    (approximate)   History   Wound Dehiscence   HPI  Rachel Holmes is a 63 y.o. female with a history of ESRD on dialysis, CHF, type 2 diabetes, hyperlipidemia, and arthritis who presents with concern for wound infection to her right ankle.  The patient initially had surgery on the ankle in March, and subsequently the wound opened up.  She was last seen by orthopedics last week.  She states that over the last 4 to 5 days the wound has opened up even more and there is now white/yellow liquid drainage from it.  She also reports increased pain to the ankle and going up her leg.  She denies any fever or chills.  I have the past medical records.  The patient was initially admitted in March with a bimalleolar right ankle fracture and had ORIF per orthopedic surgery.  She was last seen by orthopedics on 5/22 in the clinic for follow-up.  At that time there was a worsening wound dehiscence but no evidence of infection.  She was recommended for wet-to-dry dressings and outpatient wound care.   Physical Exam   Triage Vital Signs: ED Triage Vitals  Encounter Vitals Group     BP 03/30/24 2046 (!) 109/91     Systolic BP Percentile --      Diastolic BP Percentile --      Pulse Rate 03/30/24 2043 75     Resp 03/30/24 2043 18     Temp --      Temp src --      SpO2 03/30/24 2043 100 %     Weight --      Height --      Head Circumference --      Peak Flow --      Pain Score 03/30/24 2044 7     Pain Loc --      Pain Education --      Exclude from Growth Chart --     Most recent vital signs: Vitals:   03/30/24 2046 03/30/24 2052  BP: (!) 109/91 (!) 109/91  Pulse:  71  Resp:  17  Temp:  98 F (36.7 C)  SpO2:  100%     General: Awake, no distress.  CV:  Good peripheral perfusion.  Resp:  Normal effort.  Abd:  No distention.  Other:  Right lateral ankle with approximately 3 x 5 cm wound with granulation tissue.  Whitish  purulent drainage.  No surrounding erythema or induration, but the ankle does feel slightly warm to touch.  2+ DP pulse.  Normal cap refill distally.   ED Results / Procedures / Treatments   Labs (all labs ordered are listed, but only abnormal results are displayed) Labs Reviewed  BASIC METABOLIC PANEL WITH GFR - Abnormal; Notable for the following components:      Result Value   Sodium 134 (*)    Glucose, Bld 154 (*)    BUN 41 (*)    Creatinine, Ser 5.66 (*)    Calcium 8.6 (*)    GFR, Estimated 8 (*)    All other components within normal limits  CBC WITH DIFFERENTIAL/PLATELET - Abnormal; Notable for the following components:   RBC 3.59 (*)    Hemoglobin 10.3 (*)    HCT 33.6 (*)    All other components within normal limits  SEDIMENTATION RATE - Abnormal; Notable for the following components:   Sed Rate 63 (*)  All other components within normal limits  CULTURE, BLOOD (ROUTINE X 2)  CULTURE, BLOOD (ROUTINE X 2)  LACTIC ACID, PLASMA  LACTIC ACID, PLASMA  LACTIC ACID, PLASMA     EKG    RADIOLOGY  XR R ankle: I independently viewed and interpreted the images; there is soft tissue swelling with no cortical disruption.  CT R ankle: Pending   PROCEDURES:  Critical Care performed: No  Procedures   MEDICATIONS ORDERED IN ED: Medications  cefTRIAXone  (ROCEPHIN ) 2 g in sodium chloride  0.9 % 100 mL IVPB (2 g Intravenous New Bag/Given 03/30/24 2330)     IMPRESSION / MDM / ASSESSMENT AND PLAN / ED COURSE  I reviewed the triage vital signs and the nursing notes.  63 year old female with PMH as noted above presents with concern for worsening dehiscence and purulent drainage from a right ankle wound which has been present since an ORIF 2 months ago.  Differential diagnosis includes, but is not limited to, wound infection, cellulitis, less likely deep soft tissue infection, abscess, osteomyelitis.  Will obtain lab workup, x-ray, and reassess.  Patient's presentation is  most consistent with acute complicated illness / injury requiring diagnostic workup.  ----------------------------------------- 11:52 PM on 03/30/2024 -----------------------------------------  The x-ray does not show any significant abnormalities.  Lab workup reveals elevated ESR.  White count is normal.  BMP shows no acute findings.  Given the concern for acute wound infection, I feel that the patient would benefit from inpatient mission for IV antibiotics.  I consulted and discussed case with Dr. Daun Epstein from orthopedics who agrees with this plan.  He advises that Dr. Ernesta Heading is on-call tomorrow in the hospital and would be able to evaluate the patient.  I have ordered IV ceftriaxone .  I consulted Dr. Vallarie Gauze from the hospitalist service; based on our discussion she agrees to evaluate the patient for admission.   FINAL CLINICAL IMPRESSION(S) / ED DIAGNOSES   Final diagnoses:  Wound infection     Rx / DC Orders   ED Discharge Orders     None        Note:  This document was prepared using Dragon voice recognition software and may include unintentional dictation errors.    Lind Repine, MD 03/30/24 [phone]

## 2024-03-30 NOTE — ED Triage Notes (Signed)
 BIB EMS for a wound check after patient reports her surgical incision opened up approximately 4 days ago. Patient reports there has been some purulent drainage coming out of the wound.

## 2024-03-31 ENCOUNTER — Other Ambulatory Visit: Payer: Self-pay

## 2024-03-31 DIAGNOSIS — T8149XA Infection following a procedure, other surgical site, initial encounter: Secondary | ICD-10-CM | POA: Diagnosis not present

## 2024-03-31 LAB — CBC
HCT: 31.8 % — ABNORMAL LOW (ref 36.0–46.0)
Hemoglobin: 9.6 g/dL — ABNORMAL LOW (ref 12.0–15.0)
MCH: 28.2 pg (ref 26.0–34.0)
MCHC: 30.2 g/dL (ref 30.0–36.0)
MCV: 93.3 fL (ref 80.0–100.0)
Platelets: 160 10*3/uL (ref 150–400)
RBC: 3.41 MIL/uL — ABNORMAL LOW (ref 3.87–5.11)
RDW: 14.1 % (ref 11.5–15.5)
WBC: 5.2 10*3/uL (ref 4.0–10.5)
nRBC: 0 % (ref 0.0–0.2)

## 2024-03-31 LAB — GLUCOSE, CAPILLARY
Glucose-Capillary: 209 mg/dL — ABNORMAL HIGH (ref 70–99)
Glucose-Capillary: 212 mg/dL — ABNORMAL HIGH (ref 70–99)
Glucose-Capillary: 242 mg/dL — ABNORMAL HIGH (ref 70–99)
Glucose-Capillary: 199 mg/dL — ABNORMAL HIGH (ref 70–99)
Glucose-Capillary: 245 mg/dL — ABNORMAL HIGH (ref 70–99)

## 2024-03-31 LAB — RENAL FUNCTION PANEL
Albumin: 3 g/dL — ABNORMAL LOW (ref 3.5–5.0)
Anion gap: 8 (ref 5–15)
BUN: 46 mg/dL — ABNORMAL HIGH (ref 8–23)
CO2: 29 mmol/L (ref 22–32)
Calcium: 8.5 mg/dL — ABNORMAL LOW (ref 8.9–10.3)
Chloride: 98 mmol/L (ref 98–111)
Creatinine, Ser: 5.87 mg/dL — ABNORMAL HIGH (ref 0.44–1.00)
GFR, Estimated: 8 mL/min — ABNORMAL LOW (ref >60)
Glucose, Bld: 231 mg/dL — ABNORMAL HIGH (ref 70–99)
Phosphorus: 4.1 mg/dL (ref 2.5–4.6)
Potassium: 4.3 mmol/L (ref 3.5–5.1)
Sodium: 135 mmol/L (ref 135–145)

## 2024-03-31 LAB — LACTIC ACID, PLASMA: Lactic Acid, Venous: 1.2 mmol/L (ref 0.5–1.9)

## 2024-03-31 LAB — CBG MONITORING, ED: Glucose-Capillary: 169 mg/dL — ABNORMAL HIGH (ref 70–99)

## 2024-03-31 LAB — HEPATITIS B SURFACE ANTIGEN
Hepatitis B Surface Ag: NONREACTIVE
Hepatitis B Surface Ag: NONREACTIVE

## 2024-03-31 MED ORDER — VANCOMYCIN HCL IN DEXTROSE 1-5 GM/200ML-% IV SOLN
1000.0000 mg | INTRAVENOUS | Status: DC
  Filled 2024-03-31: qty 200

## 2024-03-31 MED ORDER — EZETIMIBE 10 MG PO TABS
10.0000 mg | ORAL_TABLET | Freq: Every day | ORAL | Status: DC
  Administered 2024-03-31 – 2024-04-01 (×2): 10 mg via ORAL
  Filled 2024-03-31 (×2): qty 1

## 2024-03-31 MED ORDER — PANTOPRAZOLE SODIUM 40 MG PO TBEC
40.0000 mg | DELAYED_RELEASE_TABLET | Freq: Every day | ORAL | Status: DC
  Administered 2024-04-01: 40 mg via ORAL
  Filled 2024-03-31: qty 1

## 2024-03-31 MED ORDER — HEPARIN SODIUM (PORCINE) 5000 UNIT/ML IJ SOLN
5000.0000 [IU] | Freq: Three times a day (TID) | INTRAMUSCULAR | Status: DC
  Administered 2024-03-31 – 2024-04-01 (×5): 5000 [IU] via SUBCUTANEOUS
  Filled 2024-03-31 (×5): qty 1

## 2024-03-31 MED ORDER — SODIUM CHLORIDE 0.9 % IV SOLN
2.0000 g | INTRAVENOUS | Status: DC
  Administered 2024-03-31: 2 g via INTRAVENOUS
  Filled 2024-03-31: qty 20

## 2024-03-31 MED ORDER — DIPHENHYDRAMINE HCL 50 MG/ML IJ SOLN
INTRAMUSCULAR | Status: AC
  Filled 2024-03-31: qty 1

## 2024-03-31 MED ORDER — ISOSORBIDE MONONITRATE ER 30 MG PO TB24
120.0000 mg | ORAL_TABLET | Freq: Every day | ORAL | Status: DC
  Administered 2024-03-31 – 2024-04-01 (×2): 120 mg via ORAL
  Filled 2024-03-31 (×2): qty 4

## 2024-03-31 MED ORDER — HYDROXYZINE HCL 25 MG PO TABS
25.0000 mg | ORAL_TABLET | Freq: Four times a day (QID) | ORAL | Status: DC | PRN
  Administered 2024-03-31 (×2): 25 mg via ORAL
  Filled 2024-03-31 (×3): qty 1

## 2024-03-31 MED ORDER — COLLAGENASE 250 UNIT/GM EX OINT
TOPICAL_OINTMENT | Freq: Every day | CUTANEOUS | Status: DC
  Filled 2024-03-31: qty 30

## 2024-03-31 MED ORDER — ALBUTEROL SULFATE (2.5 MG/3ML) 0.083% IN NEBU
2.5000 mg | INHALATION_SOLUTION | RESPIRATORY_TRACT | Status: DC | PRN
Start: 2024-03-31 — End: 2024-03-31

## 2024-03-31 MED ORDER — DULOXETINE HCL 30 MG PO CPEP
30.0000 mg | ORAL_CAPSULE | Freq: Every day | ORAL | Status: DC
  Administered 2024-03-31: 30 mg via ORAL
  Filled 2024-03-31: qty 1

## 2024-03-31 MED ORDER — ACETAMINOPHEN 650 MG RE SUPP: 650.0000 mg | Freq: Four times a day (QID) | RECTAL | Status: DC | PRN

## 2024-03-31 MED ORDER — INSULIN GLARGINE-YFGN 100 UNIT/ML ~~LOC~~ SOLN
14.0000 [IU] | Freq: Every day | SUBCUTANEOUS | Status: DC
  Administered 2024-03-31: 14 [IU] via SUBCUTANEOUS
  Filled 2024-03-31 (×2): qty 0.14

## 2024-03-31 MED ORDER — TORSEMIDE 20 MG PO TABS
100.0000 mg | ORAL_TABLET | Freq: Two times a day (BID) | ORAL | Status: DC
  Administered 2024-03-31 – 2024-04-01 (×2): 100 mg via ORAL
  Filled 2024-03-31 (×2): qty 5

## 2024-03-31 MED ORDER — HYDROMORPHONE HCL 1 MG/ML IJ SOLN
0.5000 mg | INTRAMUSCULAR | Status: DC | PRN
  Administered 2024-03-31 – 2024-04-01 (×4): 1 mg via INTRAVENOUS
  Filled 2024-03-31 (×4): qty 1

## 2024-03-31 MED ORDER — LACTULOSE 10 GM/15ML PO SOLN
10.0000 g | Freq: Every day | ORAL | Status: DC | PRN
  Administered 2024-04-01: 10 g via ORAL
  Filled 2024-03-31: qty 30

## 2024-03-31 MED ORDER — OXYCODONE HCL 5 MG PO TABS
5.0000 mg | ORAL_TABLET | ORAL | Status: DC | PRN
  Filled 2024-03-31 (×2): qty 1

## 2024-03-31 MED ORDER — VANCOMYCIN HCL 1250 MG/250ML IV SOLN
1250.0000 mg | Freq: Once | INTRAVENOUS | Status: AC
  Administered 2024-03-31: 1250 mg via INTRAVENOUS
  Filled 2024-03-31: qty 250

## 2024-03-31 MED ORDER — CALCITRIOL 0.25 MCG PO CAPS
0.2500 ug | ORAL_CAPSULE | ORAL | Status: DC
  Administered 2024-03-31: 0.25 ug via ORAL
  Filled 2024-03-31: qty 1

## 2024-03-31 MED ORDER — INSULIN ASPART 100 UNIT/ML IJ SOLN
0.0000 [IU] | Freq: Three times a day (TID) | INTRAMUSCULAR | Status: DC
  Administered 2024-03-31: 2 [IU] via SUBCUTANEOUS
  Administered 2024-04-01: 1 [IU] via SUBCUTANEOUS
  Filled 2024-03-31 (×3): qty 1

## 2024-03-31 MED ORDER — ONDANSETRON HCL 4 MG/2ML IJ SOLN
4.0000 mg | Freq: Four times a day (QID) | INTRAMUSCULAR | Status: DC | PRN
  Administered 2024-04-01: 4 mg via INTRAVENOUS
  Filled 2024-03-31: qty 2

## 2024-03-31 MED ORDER — INSULIN ASPART 100 UNIT/ML IJ SOLN
0.0000 [IU] | Freq: Every day | INTRAMUSCULAR | Status: DC
  Administered 2024-03-31: 2 [IU] via SUBCUTANEOUS
  Filled 2024-03-31: qty 1

## 2024-03-31 MED ORDER — ALBUTEROL SULFATE (2.5 MG/3ML) 0.083% IN NEBU: 2.5000 mg | INHALATION_SOLUTION | Freq: Four times a day (QID) | RESPIRATORY_TRACT | Status: DC | PRN

## 2024-03-31 MED ORDER — MAGNESIUM OXIDE -MG SUPPLEMENT 400 (240 MG) MG PO TABS
400.0000 mg | ORAL_TABLET | Freq: Every day | ORAL | Status: DC
  Administered 2024-03-31: 400 mg via ORAL
  Filled 2024-03-31: qty 1

## 2024-03-31 MED ORDER — ACETAMINOPHEN 325 MG PO TABS
650.0000 mg | ORAL_TABLET | Freq: Four times a day (QID) | ORAL | Status: DC | PRN
Start: 2024-03-31 — End: 2024-04-01
  Administered 2024-04-01: 650 mg via ORAL
  Filled 2024-03-31: qty 2

## 2024-03-31 MED ORDER — HEPARIN SODIUM (PORCINE) 1000 UNIT/ML DIALYSIS: 1000.0000 [IU] | INTRAMUSCULAR | Status: DC | PRN

## 2024-03-31 MED ORDER — ASPIRIN 81 MG PO TBEC
81.0000 mg | DELAYED_RELEASE_TABLET | Freq: Every day | ORAL | Status: DC
  Administered 2024-03-31 – 2024-04-01 (×2): 81 mg via ORAL
  Filled 2024-03-31 (×2): qty 1

## 2024-03-31 MED ORDER — GABAPENTIN 300 MG PO CAPS
600.0000 mg | ORAL_CAPSULE | Freq: Every day | ORAL | Status: DC
  Administered 2024-03-31: 600 mg via ORAL
  Filled 2024-03-31: qty 2

## 2024-03-31 MED ORDER — CHLORHEXIDINE GLUCONATE CLOTH 2 % EX PADS
6.0000 | MEDICATED_PAD | Freq: Every day | CUTANEOUS | Status: DC
  Administered 2024-04-01: 6 via TOPICAL

## 2024-03-31 MED ORDER — VANCOMYCIN HCL 1250 MG/250ML IV SOLN
1250.0000 mg | Freq: Once | INTRAVENOUS | Status: AC
  Administered 2024-03-31: 1250 mg via INTRAVENOUS
  Filled 2024-03-31 (×2): qty 250

## 2024-03-31 MED ORDER — DIPHENHYDRAMINE HCL 50 MG/ML IJ SOLN
25.0000 mg | Freq: Once | INTRAMUSCULAR | Status: AC
  Administered 2024-03-31: 25 mg via INTRAVENOUS

## 2024-03-31 MED ORDER — ONDANSETRON HCL 4 MG PO TABS
4.0000 mg | ORAL_TABLET | Freq: Four times a day (QID) | ORAL | Status: DC | PRN
Start: 2024-03-31 — End: 2024-04-01

## 2024-03-31 MED ORDER — PENTAFLUOROPROP-TETRAFLUOROETH EX AERO: 1.0000 | INHALATION_SPRAY | CUTANEOUS | Status: DC | PRN

## 2024-03-31 MED ORDER — LIDOCAINE-PRILOCAINE 2.5-2.5 % EX CREA: 1.0000 | TOPICAL_CREAM | CUTANEOUS | Status: DC | PRN

## 2024-03-31 NOTE — Assessment & Plan Note (Signed)
 Neurosurgery made

## 2024-03-31 NOTE — Assessment & Plan Note (Signed)
 BP soft at 109/91 so holding home antihypertensives

## 2024-03-31 NOTE — H&P (Signed)
 History and Physical    Patient: Rachel Holmes BJY:[id number] DOB: 05/06/1961 DOA: 03/30/2024 DOS: the patient was seen and examined on 03/31/2024 PCP: Alexander Anes, MD  Patient coming from: Home  Chief Complaint:  Chief Complaint  Patient presents with   Wound Dehiscence    HPI: Rachel Holmes is a 63 y.o. female with medical history significant for ESRD on HD MWF,  DM, HTN, HFrEF, chronic anemia, anxiety, left BKA, history of ORIF right ankle 01/2024 for bimalleolar fracture, complicated by wound dehiscence a month later, managed by Ortho outpatient being admitted due to suspected new infection of her dehisced surgical wound of the right ankle.  She reports that about a week ago the wound opened up more and started draining purulent material.  States sutures were removed about 3 weeks ago. Patient reports having generalized malaise for the past 4 days but denies fever or chills or vomiting. Notes from Ortho reviewed reviewed.  Wound is dehisced in late April and she received 2 courses of Bactrim  over that time, however at her last follow-up appointment with Ortho on 5/22, the wound appeared a bit larger albeit without infection and she was referred to the wound care clinic.   ED course and data review: Soft BP to 109/91 with otherwise normal vitals.  Labs notable for normal WBC of 7.2 with normal lactic acid of 1.0.  Sed rate elevated at 63 Hemoglobin at baseline at 10.3 BMP at baseline for hemodialysis status  X-ray of the right ankle showed interim finding of large wound ulcer lateral aspect of the ankle with generalized soft tissue swelling. CT of the ankle ordered but result pending  The ED provider spoke with on-call Ortho, Dr. Daun Epstein who recommended antibiotics and admission   Patient started on ceftriaxone .    Hospitalist consulted for admission     Review of Systems: As mentioned in the history of present illness. All other systems reviewed and are negative.  Past  Medical History:  Diagnosis Date   Allergic rhinitis    Allergy    Anemia    Anxiety    Aortic stenosis 09/13/2016   a.) TTE 09/13/2016: mild AS --> MPG 11.2 mmHg. b.) TTE 12/27/2020: EF 55-60%; no AS --> MPG 8 mmHg.   Aortic valve endocarditis 12/25/2018   a.) TTE 12/25/2018: 0.75 x 1 cm AV mass. b.) TEE 12/28/2018: small shaggy mobile density on aortic side of RIGHT coronary cusp consistent with vegetation.   Arthritis    Back pain, chronic    Bimalleolar fracture of right ankle 01/2024   Bradycardia    Breast mass    Patient can no longer palpate specific masses but showed tech general area of concern   Charcot's joint of ankle, left    CHF (congestive heart failure) (HCC) 07/09/2007   a.) TTE 07/09/2007: EF 50%; G1DD. b.) TTE 09/13/2016: EF 55%; G2DD. c.) TTE 12/27/2020: EF 55-60%; G2DD; GLS -15.1%.; d.) TTE 09/04/2023: EF 50-55%, G1DD, mild LAE, mild MR, mod AoV calc   Complication of anesthesia    a.) PONV. b.) Delayed emergence   Constipation    Diabetic nephropathy (HCC)    DVT (deep venous thrombosis) (HCC)    Dyspnea    ESRD (end stage renal disease) on dialysis [facility])    Family history of adverse reaction to anesthesia    a.) Sisters x 2 with (+) delayed emergence.   GERD (gastroesophageal reflux disease)    Heart murmur    HLD (hyperlipidemia)  Hyperparathyroidism (HCC)    Hypertension    Legally blind in left eye, as defined in [place]     Lymphedema    Mild pulmonary hypertension (HCC) 09/13/2016   a.) TTE 09/13/2016: EF 55%; RVSP 45 mmHg. b.) TTE 12/25/2018: EF 55-60%; RVSP 35.5 mmHg.   Obesity    Onychomycosis    PONV (postoperative nausea and vomiting)    S/P BKA (below knee amputation) unilateral, left (HCC)    Sepsis (HCC) 12/2018   a.) group G streptococcal bacteremia with AV endocarditis and LLE soft tissue infection.   T2DM (type 2 diabetes mellitus) [facility])    Past Surgical History:  Procedure Laterality Date   A/V FISTULAGRAM Left 06/18/2022    Procedure: A/V Fistulagram;  Surgeon: Jackquelyn Mass, MD;  Location: [facility] INVASIVE CV LAB;  Service: Cardiovascular;  Laterality: Left;   A/V FISTULAGRAM Left 08/13/2022   Procedure: A/V Fistulagram;  Surgeon: Jackquelyn Mass, MD;  Location: [facility] INVASIVE CV LAB;  Service: Cardiovascular;  Laterality: Left;   ABDOMINAL HYSTERECTOMY     AMPUTATION Left 05/05/2019   Procedure: AMPUTATION BELOW KNEE;  Surgeon: Jackquelyn Mass, MD;  Location: [facility] ORS;  Service: Vascular;  Laterality: Left;   AV FISTULA PLACEMENT Right 09/19/2021   Procedure: ARTERIOVENOUS (AV) FISTULA CREATION (BRACHIAL CEPHALIC);  Surgeon: Jackquelyn Mass, MD;  Location: [facility] ORS;  Service: Vascular;  Laterality: Right;   AV FISTULA PLACEMENT Left 11/09/2021   Procedure: ARTERIOVENOUS (AV) FISTULA CREATION ( BRACHIAL CEPHALIC );  Surgeon: Jackquelyn Mass, MD;  Location: [facility] ORS;  Service: Vascular;  Laterality: Left;   BREAST BIOPSY Left 2014   FNA 12:00 position - Negative   CATARACT EXTRACTION Bilateral    DIALYSIS/PERMA CATHETER INSERTION N/A 03/28/2022   Procedure: DIALYSIS/PERMA CATHETER INSERTION;  Surgeon: Celso College, MD;  Location: [facility] INVASIVE CV LAB;  Service: Cardiovascular;  Laterality: N/A;   DIALYSIS/PERMA CATHETER REMOVAL N/A 12/09/2022   Procedure: DIALYSIS/PERMA CATHETER REMOVAL;  Surgeon: Celso College, MD;  Location: [facility] INVASIVE CV LAB;  Service: Cardiovascular;  Laterality: N/A;   EYE SURGERY Left 2007   removed a lens, no lens implanted   IR FLUORO GUIDE CV LINE RIGHT  12/29/2018   IR REMOVAL TUN CV CATH W/O FL  02/19/2019   IR US  GUIDE VASC ACCESS RIGHT  12/29/2018   ORIF ANKLE FRACTURE Right 01/22/2024   Procedure: OPEN REDUCTION INTERNAL FIXATION (ORIF) ANKLE FRACTURE;  Surgeon: Tonita Frater, MD;  Location: [facility] ORS;  Service: Orthopedics;  Laterality: Right;   REVISON OF ARTERIOVENOUS FISTULA Left 10/16/2022   Procedure: REVISON OF ARTERIOVENOUS FISTULA;  Surgeon: Jackquelyn Mass, MD;  Location: [facility] ORS;  Service: Vascular;  Laterality: Left;   TEE WITHOUT CARDIOVERSION N/A 12/28/2018   Procedure: TRANSESOPHAGEAL ECHOCARDIOGRAM (TEE);  Surgeon: Jacqueline Matsu, MD;  Location: [facility] ENDOSCOPY;  Service: Cardiovascular;  Laterality: N/A;   Social History:  reports that she has never smoked. She has never used smokeless tobacco. She reports that she does not drink alcohol and does not use drugs.  Allergies  Allergen Reactions   Nifedipine Rash   Penicillins Hives, Shortness Of Breath and Swelling   Statins Shortness Of Breath    Wheezing   Hydralazine  Itching   Hydrocodone  Itching and Nausea Only   Ivp Dye [Iodinated Contrast Media]     ESRD   Metoprolol Diarrhea and Other (See Comments)    Pt states "make my heart race"   Oxycodone  Nausea Only   Codeine Nausea Only  Ibuprofen  Other (See Comments)    Raises blood pressure    Family History  Problem Relation Age of Onset   Breast cancer Sister 80   Diabetes Sister    Diabetes Mother    Hypertension Mother    Hyperlipidemia Mother    Eating disorder Mother    Obesity Mother     Prior to Admission medications   Medication Sig Start Date End Date Taking? Authorizing Provider  acetaminophen  (TYLENOL ) 500 MG tablet Take 2 tablets (1,000 mg total) by mouth every 6 (six) hours as needed. 01/22/24 01/21/25  Tonita Frater, MD  albuterol  (PROVENTIL  HFA;VENTOLIN  HFA) 108 (90 Base) MCG/ACT inhaler Inhale 1-2 puffs into the lungs every 6 (six) hours as needed for wheezing or shortness of breath.    [provider]  calcitRIOL (ROCALTROL) 0.25 MCG capsule Take 0.25 mcg by mouth every Monday, Wednesday, and Friday with hemodialysis. 10/23/21   [provider]  cetirizine (ZYRTEC) 10 MG tablet Take 10 mg by mouth at bedtime.    [provider]  clobetasol cream (TEMOVATE) 0.05 % Apply 1 application  topically 2 (two) times daily as needed (rash).    [provider]  cloNIDine   (CATAPRES ) 0.3 MG tablet Take 1 tablet (0.3 mg total) by mouth 2 (two) times daily. 06/18/21   Gollan, Timothy J, MD  Continuous Blood Gluc Receiver (FREESTYLE LIBRE 14 DAY READER) DEVI USE AS DIRECTED 07/13/20   Gwyndolyn Lerner, MD  docusate sodium  (COLACE) 100 MG capsule Take 1 capsule (100 mg total) by mouth 2 (two) times daily. 05/10/19   Stegmayer, Jullie Oiler A, PA-C  doxazosin  (CARDURA ) 8 MG tablet Take 8 mg by mouth daily.    [provider]  DULoxetine  (CYMBALTA ) 30 MG capsule Take 30 mg by mouth at bedtime. 08/28/18   [provider]  ezetimibe  (ZETIA ) 10 MG tablet Take 1 tablet (10 mg total) by mouth daily. 06/18/21   Gollan, Timothy J, MD  gabapentin  (NEURONTIN ) 600 MG tablet Take 600 mg by mouth at bedtime.    [provider]  glucosamine-chondroitin 500-400 MG tablet Take 1 tablet by mouth in the morning and at bedtime.    [provider]  hydrOXYzine (ATARAX) 25 MG tablet Take 25 mg by mouth every 6 (six) hours as needed for itching.    [provider]  insulin  degludec (TRESIBA) 100 UNIT/ML FlexTouch Pen Inject 14 Units into the skin at bedtime. 12/01/23   [provider]  isosorbide  mononitrate (IMDUR ) 120 MG 24 hr tablet Take 1 tablet (120 mg total) by mouth in the morning and at bedtime. 01/27/24   Patel, Sona, MD  lactulose  (CHRONULAC ) 10 GM/15ML solution Take 15 mLs by mouth daily as needed for moderate constipation. 06/14/22   [provider]  Lancets (ONETOUCH ULTRASOFT) lancets Used to check blood sugars four times daily. 03/26/18   Gwyndolyn Lerner, MD  LYUMJEV  KWIKPEN 100 UNIT/ML KwikPen Inject 10-20 Units into the skin in the morning and at bedtime. With meals 12/01/23   [provider]  Magnesium  400 MG CAPS Take 400 mg by mouth at bedtime.    [provider]  MOUNJARO 7.5 MG/0.5ML Pen Inject 10 mg into the skin once a week. 12/29/23   [provider]  Multiple Vitamin (MULTIVITAMIN WITH MINERALS) TABS  tablet Take 1 tablet by mouth daily.    [provider]  olmesartan (BENICAR) 40 MG tablet Take 40 mg by mouth daily.    [provider]  omeprazole (PRILOSEC)  40 MG capsule Take 40 mg by mouth 2 (two) times daily with breakfast and lunch.    [provider]  [facility] VERIO test strip USE TO CHECK BLOOD SUGAR TWICE DAILY 01/21/22   Gwyndolyn Lerner, MD  torsemide  (DEMADEX ) 100 MG tablet Take 1 tablet (100 mg total) by mouth 2 (two) times daily. 08/18/23   Gollan, Timothy J, MD  vitamin B-12 (CYANOCOBALAMIN ) 1000 MCG tablet Take 1,000 mcg by mouth daily.    [provider]    Physical Exam: Vitals:   03/30/24 2043 03/30/24 2046 03/30/24 2052  BP:  (!) 109/91 (!) 109/91  Pulse: 75  71  Resp: 18  17  Temp:   98 F (36.7 C)  TempSrc:   Oral  SpO2: 100%  100%   Physical Exam Vitals and nursing note reviewed.  Constitutional:      General: She is not in acute distress. HENT:     Head: Normocephalic and atraumatic.  Cardiovascular:     Rate and Rhythm: Normal rate and regular rhythm.     Heart sounds: Normal heart sounds.  Pulmonary:     Effort: Pulmonary effort is normal.     Breath sounds: Normal breath sounds.  Abdominal:     Palpations: Abdomen is soft.     Tenderness: There is no abdominal tenderness.  Musculoskeletal:     Comments: See pictures of wound below  Neurological:     Mental Status: Mental status is at baseline.        Labs on Admission: I have personally reviewed following labs and imaging studies  CBC: Recent Labs  Lab 03/30/24 2111  WBC 7.2  NEUTROABS 3.6  HGB 10.3*  HCT 33.6*  MCV 93.6  PLT 169   Basic Metabolic Panel: Recent Labs  Lab 03/30/24 2111  NA 134*  K 4.0  CL 98  CO2 24  GLUCOSE 154*  BUN 41*  CREATININE 5.66*  CALCIUM 8.6*   GFR: Estimated Creatinine Clearance: 15.3 mL/min (A) (by C-G formula based on SCr of 5.66 mg/dL (H)). Liver Function Tests: No results for input(s): "AST", "ALT",  "ALKPHOS", "BILITOT", "PROT", "ALBUMIN " in the last 168 hours. No results for input(s): "LIPASE", "AMYLASE" in the last 168 hours. No results for input(s): "AMMONIA" in the last 168 hours. Coagulation Profile: No results for input(s): "INR", "PROTIME" in the last 168 hours. Cardiac Enzymes: No results for input(s): "CKTOTAL", "CKMB", "CKMBINDEX", "TROPONINI" in the last 168 hours. BNP (last 3 results) No results for input(s): "PROBNP" in the last 8760 hours. HbA1C: No results for input(s): "HGBA1C" in the last 72 hours. CBG: No results for input(s): "GLUCAP" in the last 168 hours. Lipid Profile: No results for input(s): "CHOL", "HDL", "LDLCALC", "TRIG", "CHOLHDL", "LDLDIRECT" in the last 72 hours. Thyroid  Function Tests: No results for input(s): "TSH", "T4TOTAL", "FREET4", "T3FREE", "THYROIDAB" in the last 72 hours. Anemia Panel: No results for input(s): "VITAMINB12", "FOLATE", "FERRITIN", "TIBC", "IRON", "RETICCTPCT" in the last 72 hours. Urine analysis:    Component Value Date/Time   COLORURINE STRAW (A) 02/16/2023 2129   APPEARANCEUR CLEAR (A) 02/16/2023 2129   LABSPEC 1.006 02/16/2023 2129   PHURINE 5.0 02/16/2023 2129   GLUCOSEU NEGATIVE 02/16/2023 2129   HGBUR NEGATIVE 02/16/2023 2129   BILIRUBINUR NEGATIVE 02/16/2023 2129   KETONESUR NEGATIVE 02/16/2023 2129   PROTEINUR 30 (A) 02/16/2023 2129   NITRITE NEGATIVE 02/16/2023 2129   LEUKOCYTESUR NEGATIVE 02/16/2023 2129    Radiological Exams on Admission: CT Ankle Right Wo Contrast Result Date: 03/30/2024 CLINICAL  DATA:  Surgical incision opened 4 days ago. Purulence drainage. Query osteomyelitis EXAM: CT OF THE RIGHT ANKLE WITHOUT CONTRAST TECHNIQUE: Multidetector CT imaging of the right ankle was performed according to the standard protocol. Multiplanar CT image reconstructions were also generated. RADIATION DOSE REDUCTION: This exam was performed according to the departmental dose-optimization program which includes  automated exposure control, adjustment of the mA and/or kV according to patient size and/or use of iterative reconstruction technique. COMPARISON:  Ankle radiograph earlier today FINDINGS: Bones/Joint/Cartilage Plate and screw fixation of the distal fibular fracture. Screw fixation of the medial malleolus fracture. Periosteal new bone formation about the fracture lines. Heterogenous sclerosis within the distal tibia and fibula may be due to healing change though osteomyelitis is not excluded. Healing posterior malleolus fracture. Advanced arthritis in the midfoot. Ligaments Suboptimally assessed by CT. Muscles and Tendons Grossly intact. Soft tissues Diffuse soft tissue swelling about the ankle. Soft tissue ulcer along the lateral aspect of the ankle superficial to the fibular fixation hardware. No organized fluid collection. No soft tissue gas. IMPRESSION: 1. Soft tissue ulcer along the lateral aspect of the ankle superficial to the fibular fixation hardware. No organized fluid collection. No soft tissue gas. 2. Heterogenous sclerosis within the distal tibia and fibula may be due to healing change though osteomyelitis is not excluded. 3. Diffuse soft tissue swelling about the ankle. Electronically Signed   By: Rozell Cornet M.D.   On: 03/30/2024 23:36   DG Ankle Complete Right Result Date: 03/30/2024 CLINICAL DATA:  Wound infection EXAM: RIGHT ANKLE - COMPLETE 3+ VIEW COMPARISON:  02/26/2024, 02/04/2024, 01/14/2024, 12/18/2023 FINDINGS: Stable surgical plate and screw fixation of the distal fibula with 2 long distal tibiofibular syndesmosis fixating screws. Fixating screws at medial malleolar fracture. Similar small amount of periosteal new bone formation but with visible fracture lucency at the medial malleolus. Mild medial downsloping of talar dome as before. Interim finding of large wound or ulcer lateral aspect of the ankle. Generalized soft tissue swelling. Vascular calcifications. Moderate plantar  calcaneal spur. Moderate degenerative changes of the midfoot IMPRESSION: 1. Interim finding of large wound or ulcer lateral aspect of the ankle. Generalized soft tissue swelling. 2. Stable surgical fixation of the distal fibula and medial malleolus. Similar small amount of periosteal new bone formation but with residual visible fracture lucencies. Electronically Signed   By: Esmeralda Hedge M.D.   On: 03/30/2024 21:22   Data Reviewed for HPI: Relevant notes from primary care and specialist visits, past discharge summaries as available in EHR, including Care Everywhere. Prior diagnostic testing as pertinent to current admission diagnoses Updated medications and problem lists for reconciliation ED course, including vitals, labs, imaging, treatment and response to treatment Triage notes, nursing and pharmacy notes and ED provider's notes Notable results as noted above in HPI      Assessment and Plan: * Surgical wound infection, with wound dehiscence History of ORIF right ankle 01/2024 History of left BKA  Sed rate was over 60 Rocephin  and vancomycin  Wound culture Will get blood cultures (has a history of strep bacteremia with endocarditis) Pain control Follow-up CT of the ankle Orthopedics consulted  ESRD on hemodialysis (HCC) Anemia of ESRD Hemoglobin at baseline Nephrology consult for continuation of dialysis  Hypertension BP soft at 109/91 so holding home antihypertensives  Morbid obesity with BMI of 45.0-49.9, adult (HCC) Complicating factor to overall prognosis and care  Chronic diastolic CHF (congestive heart failure) (HCC) Clinically euvolemic Continue home GDMT olmesartan 20 mg  Type 2 diabetes mellitus  with diabetic peripheral angiopathy without gangrene, with long-term current use of insulin  (HCC) Continue basal insulin  Sliding scale coverage  PVD (peripheral vascular disease) [facility]) Neurosurgery made  Chronic pain syndrome Continue duloxetine  and gabapentin   pending verification     DVT prophylaxis: Lovenox   Consults: Renal and Ortho  Advance Care Planning:   Code Status: Prior   Family Communication: none  Disposition Plan: Back to previous home environment  Severity of Illness: The appropriate patient status for this patient is OBSERVATION. Observation status is judged to be reasonable and necessary in order to provide the required intensity of service to ensure the patient's safety. The patient's presenting symptoms, physical exam findings, and initial radiographic and laboratory data in the context of their medical condition is felt to place them at decreased risk for further clinical deterioration. Furthermore, it is anticipated that the patient will be medically stable for discharge from the hospital within 2 midnights of admission.   Author: Lanetta Pion, MD 03/31/2024 12:08 AM  For on call review [email or web address].

## 2024-03-31 NOTE — ED Notes (Signed)
 Second lactic not drawn per Dr. Vallarie Gauze since the first lactic is 1.0.

## 2024-03-31 NOTE — Progress Notes (Signed)
 Hemodialysis Note:  Received patient in bed to unit. Alert and oriented. Informed consent singed and in chart.  Treatment initiated: 0925 Treatment completed: 1347  Access used: Left AVF Access issues: Restuck patient's arm due to high venous pressure. Cartridge changed due to system clot  100 ml saline bolus given due to hypotension episode. Patient tolerated well. Transported back to room, alert without acute distress. Report given to patient's RN.  Total UF removed: 1.5 Liters Medications given: Vancomycin  1250 mg IV, Benadryl  25 mg IV, Atarax 25 mg Tablet  Post HD weight: 140.8 Kg  [facility]

## 2024-03-31 NOTE — Progress Notes (Signed)
 Interim no charge progress note.  This patient was admitted by my colleague earlier this morning for evaluation of lower leg wound.  Orthopedic surgery has been consulted and has recommended wound nurse consultation. At the time I evaluated the patient she was receiving hemodialysis.  She reports her leg pain is persistent.  She is currently on ceftriaxone  and vancomycin .  We will continue this until wound cultures have resulted.  Leg does not appear significantly erythematous and does not appear to have frank purulent drainage.  Blood cultures negative at this time.  CT ankle with soft tissue swelling but no organized fluid collection.  Heterogenous sclerosis of the distal tibia and fibula may be due to healing though osteomyelitis is not able to be excluded.  Appreciate recommendations from orthopedic surgery team.     03/31/2024    2:43 PM 03/31/2024    1:55 PM 03/31/2024    1:47 PM  Vitals with BMI  Weight   310 lbs 7 oz  BMI   47.21  Systolic 150 113 161  Diastolic 67 50 35  Pulse 77 85 79

## 2024-03-31 NOTE — Assessment & Plan Note (Addendum)
 History of ORIF right ankle 01/2024 History of left BKA  Sed rate was over 60 Rocephin  and vancomycin  Wound culture Will get blood cultures (has a history of strep bacteremia with endocarditis) Pain control Follow-up CT of the ankle Orthopedics consulted

## 2024-03-31 NOTE — Assessment & Plan Note (Addendum)
 Anemia of ESRD Hemoglobin at baseline Nephrology consult for continuation of dialysis

## 2024-03-31 NOTE — Assessment & Plan Note (Signed)
 Complicating factor to overall prognosis and care

## 2024-03-31 NOTE — Hospital Course (Signed)
 Rachel Holmes

## 2024-03-31 NOTE — Progress Notes (Signed)
 [facility]  ROUNDING NOTE   Subjective:   Rachel Holmes is a 63 y.o. female with past medical conditions including anemia, anxiety, CHF, diabetes, diabetic nephropathy, GERD, hyperlipidemia, legally blind in left eye, and end-stage renal disease on hemodialysis.  Patient presented to the emergency department for wound evaluation. She has been admitted for Surgical wound infection [T81.49XA] Wound infection [T14.8XXA, L08.9]  Patient is known to our practice and receives outpatient dialysis at [facility] on a MWF schedule. Last treatment received on Monday. Patient states she had surgery a few weeks ago on right ankle and wound has opened up. She has been seen by surgeon who referred patient to wound care. She felt wound was getting worse due to drainage and wanted it evaluated. Denies pain. No swelling.   She is seen and evaluated during dialysis.    HEMODIALYSIS FLOWSHEET:  Blood Flow Rate (mL/min): 300 mL/min Arterial Pressure (mmHg): -186.66 mmHg Venous Pressure (mmHg): 175.55 mmHg TMP (mmHg): 21.82 mmHg Ultrafiltration Rate (mL/min): 1039 mL/min Dialysate Flow Rate (mL/min): 300 ml/min   Labs unremarkable for renal patient. Blood cultures pending. Right ankle imaging shows soft tissue swelling. CT ankle could not exclude osteomyelitis. Primary team has ordered antibiotics.   We have been consulted to manage dialysis needs.    Objective:  Vital signs in last 24 hours:  Temp:  [98 F (36.7 C)-98.7 F (37.1 C)] 98.4 F (36.9 C) (05/28 0904) Pulse Rate:  [66-79] 74 (05/28 1130) Resp:  [10-20] 19 (05/28 1130) BP: (109-181)/(47-97) 152/52 (05/28 1130) SpO2:  [96 %-100 %] 100 % (05/28 1130) Weight:  [140.9 kg] 140.9 kg (05/28 0904)  Weight change:  Filed Weights   03/31/24 0904  Weight: (!) 140.9 kg    Intake/Output: No intake/output data recorded.   Intake/Output this shift:  No intake/output data recorded.  Physical Exam: General: NAD   Head: Normocephalic, atraumatic. Moist oral mucosal membranes  Eyes: Anicteric  Lungs:  Clear to auscultation, normal effort  Heart: Regular rate and rhythm  Abdomen:  Soft, nontender  Extremities:  No peripheral edema.  Neurologic: Nonfocal, moving all four extremities  Skin: Rt ankle wound  Access: Lt AVF    Basic Metabolic Panel: Recent Labs  Lab 03/30/24 2111 03/31/24 0921  NA 134* 135  K 4.0 4.3  CL 98 98  CO2 24 29  GLUCOSE 154* 231*  BUN 41* 46*  CREATININE 5.66* 5.87*  CALCIUM 8.6* 8.5*  PHOS  --  4.1    Liver Function Tests: Recent Labs  Lab 03/31/24 0921  ALBUMIN  3.0*   No results for input(s): "LIPASE", "AMYLASE" in the last 168 hours. No results for input(s): "AMMONIA" in the last 168 hours.  CBC: Recent Labs  Lab 03/30/24 2111 03/31/24 0921  WBC 7.2 5.2  NEUTROABS 3.6  --   HGB 10.3* 9.6*  HCT 33.6* 31.8*  MCV 93.6 93.3  PLT 169 160    Cardiac Enzymes: No results for input(s): "CKTOTAL", "CKMB", "CKMBINDEX", "TROPONINI" in the last 168 hours.  BNP: Invalid input(s): "POCBNP"  CBG: Recent Labs  Lab 03/31/24 0150 03/31/24 0728  GLUCAP 169* 209*    Microbiology: Results for orders placed or performed during the hospital encounter of 03/30/24  Culture, blood (routine x 2)     Status: None (Preliminary result)   Collection Time: 03/30/24  9:11 PM   Specimen: BLOOD  Result Value Ref Range Status   Specimen Description BLOOD RIGHT HAND  Final   Special Requests   Final  BOTTLES DRAWN AEROBIC AND ANAEROBIC Blood Culture results may not be optimal due to an inadequate volume of blood received in culture bottles   Culture   Final    NO GROWTH < 12 HOURS Performed at [facility], [address] [place], [place] [address]    Report Status PENDING  Incomplete  Culture, blood (routine x 2)     Status: None (Preliminary result)   Collection Time: 03/30/24  9:38 PM   Specimen: BLOOD  Result Value Ref Range Status   Specimen  Description BLOOD BLOOD RIGHT HAND  Final   Special Requests   Final    BOTTLES DRAWN AEROBIC AND ANAEROBIC Blood Culture results may not be optimal due to an inadequate volume of blood received in culture bottles   Culture   Final    NO GROWTH < 12 HOURS Performed at [facility], [address], [place], [place] [address]    Report Status PENDING  Incomplete    Coagulation Studies: No results for input(s): "LABPROT", "INR" in the last 72 hours.  Urinalysis: No results for input(s): "COLORURINE", "LABSPEC", "PHURINE", "GLUCOSEU", "HGBUR", "BILIRUBINUR", "KETONESUR", "PROTEINUR", "UROBILINOGEN", "NITRITE", "LEUKOCYTESUR" in the last 72 hours.  Invalid input(s): "APPERANCEUR"    Imaging: CT Ankle Right Wo Contrast Result Date: 03/30/2024 CLINICAL DATA:  Surgical incision opened 4 days ago. Purulence drainage. Query osteomyelitis EXAM: CT OF THE RIGHT ANKLE WITHOUT CONTRAST TECHNIQUE: Multidetector CT imaging of the right ankle was performed according to the standard protocol. Multiplanar CT image reconstructions were also generated. RADIATION DOSE REDUCTION: This exam was performed according to the departmental dose-optimization program which includes automated exposure control, adjustment of the mA and/or kV according to patient size and/or use of iterative reconstruction technique. COMPARISON:  Ankle radiograph earlier today FINDINGS: Bones/Joint/Cartilage Plate and screw fixation of the distal fibular fracture. Screw fixation of the medial malleolus fracture. Periosteal new bone formation about the fracture lines. Heterogenous sclerosis within the distal tibia and fibula may be due to healing change though osteomyelitis is not excluded. Healing posterior malleolus fracture. Advanced arthritis in the midfoot. Ligaments Suboptimally assessed by CT. Muscles and Tendons Grossly intact. Soft tissues Diffuse soft tissue swelling about the ankle. Soft tissue ulcer along the lateral aspect  of the ankle superficial to the fibular fixation hardware. No organized fluid collection. No soft tissue gas. IMPRESSION: 1. Soft tissue ulcer along the lateral aspect of the ankle superficial to the fibular fixation hardware. No organized fluid collection. No soft tissue gas. 2. Heterogenous sclerosis within the distal tibia and fibula may be due to healing change though osteomyelitis is not excluded. 3. Diffuse soft tissue swelling about the ankle. Electronically Signed   By: Rozell Cornet M.D.   On: 03/30/2024 23:36   DG Ankle Complete Right Result Date: 03/30/2024 CLINICAL DATA:  Wound infection EXAM: RIGHT ANKLE - COMPLETE 3+ VIEW COMPARISON:  02/26/2024, 02/04/2024, 01/14/2024, 12/18/2023 FINDINGS: Stable surgical plate and screw fixation of the distal fibula with 2 long distal tibiofibular syndesmosis fixating screws. Fixating screws at medial malleolar fracture. Similar small amount of periosteal new bone formation but with visible fracture lucency at the medial malleolus. Mild medial downsloping of talar dome as before. Interim finding of large wound or ulcer lateral aspect of the ankle. Generalized soft tissue swelling. Vascular calcifications. Moderate plantar calcaneal spur. Moderate degenerative changes of the midfoot IMPRESSION: 1. Interim finding of large wound or ulcer lateral aspect of the ankle. Generalized soft tissue swelling. 2. Stable surgical fixation of the distal  fibula and medial malleolus. Similar small amount of periosteal new bone formation but with residual visible fracture lucencies. Electronically Signed   By: Esmeralda Hedge M.D.   On: 03/30/2024 21:22     Medications:    cefTRIAXone  (ROCEPHIN )  IV     vancomycin      And   vancomycin       calcitRIOL  0.25 mcg Oral Q M,W,F-HD   Chlorhexidine  Gluconate Cloth  6 each Topical Q0600   collagenase   Topical Daily   heparin   5,000 Units Subcutaneous Q8H   insulin  aspart  0-5 Units Subcutaneous QHS   insulin  aspart  0-6  Units Subcutaneous TID WC   insulin  glargine-yfgn  14 Units Subcutaneous QHS   isosorbide  mononitrate  120 mg Oral Daily   acetaminophen  **OR** acetaminophen , albuterol , heparin , HYDROmorphone  (DILAUDID ) injection, hydrOXYzine, lidocaine -prilocaine , ondansetron  **OR** ondansetron  (ZOFRAN ) IV, oxyCODONE , pentafluoroprop-tetrafluoroeth  Assessment/ Plan:  Ms. WINDSOR ZIRKELBACH is a 63 y.o.  female with anemia, anxiety, CHF, diabetes, diabetic nephropathy, GERD, hyperlipidemia, legally blind in left eye, and end-stage renal disease on hemodialysis. Presents to ED for wound evaluation and has been admitted for Surgical wound infection [T81.49XA] Wound infection [T14.8XXA, L08.9]  [facility]/MWF/left aVF/142.5 kg   Wound dehiscence with infection. Right ankle ORIF in March. Blood and wound cultures pending. Primary team has ordered Vancomycin  and Ceftriazone.   2. End stage renal disease on hemodialysis. Last treatment on Monday. Will perform dialysis today, UF 2L as tolerated. Next treatment scheduled for Friday.   3. Anemia of chronic kidney disease Lab Results  Component Value Date   HGB 9.6 (L) 03/31/2024  Receives Mircera outpatient Hgb acceptable. Will consider low dose EPO with next treatment  4. Secondary Hyperparathyroidism: with outpatient labs: PTH 320, phosphorus 4.4, calcium 8.1 on 03/12/24.    Lab Results  Component Value Date   CALCIUM 8.5 (L) 03/31/2024   CAION 1.05 (L) 01/22/2024   PHOS 4.1 03/31/2024    Will continue to monitor bone minerals during this admission.   Prescribed calcitriol outpatient.   5. Hypertension with chronic kidney disease. Home regimen includes clonidine , doxazosin , olmasartan, and torsemide . Receiving isosorbide  only.    LOS: 0 Rachel Holmes 5/28/202511:37 AM

## 2024-03-31 NOTE — Progress Notes (Signed)
 Patient arrived on unit in stable condition. Assessment done, Patient noted with wound to her left ankle. Surgery done that dehisced in March. Admitted with cellulitis and infection to wound. PRN pain medication given for pain. Patient also have a left leg amputation previous done. Alert and verbally responsive X 4.

## 2024-03-31 NOTE — Consult Note (Signed)
 WOC Nurse Consult Note: Reason for Consult:Right lateral malleolus surgical wound dehiscence with infection.  History Left BKA.  Wound type:surgical, infectious Pressure Injury POA: NA Measurement: Right lateral malleolus 9 cm suture line with dehiscence measuring 5 cm x 3.4 cm x 0.3 cm with fibrin to wound bed.  Wound bed:20% fibrin 80% pale pink Drainage (amount, consistency, odor) moderate serosanguinous no odor Periwound:  intact  Dressing procedure/placement/frequency: Cleanse wound to right lateral malleolus with NS and pat dry.  Apply Santyl to wound bed. Cover with NS moist gauze.  Top with dry gauze, kerlix and tape.  Change daily.  Will not follow at this time.  Please re-consult if needed.  Branda Cain MSN, RN, FNP-BC CWON Wound, Ostomy, Continence Nurse Outpatient [facility] [phone] Pager [phone]

## 2024-03-31 NOTE — Assessment & Plan Note (Addendum)
 Clinically euvolemic Continue home GDMT olmesartan 20 mg

## 2024-03-31 NOTE — Assessment & Plan Note (Signed)
 Continue duloxetine  and gabapentin  pending verification

## 2024-03-31 NOTE — Progress Notes (Signed)
 Pharmacy Antibiotic Note  Rachel Holmes is a 63 y.o. female w/ ESRD on MWF HD, admitted on 03/30/2024 with cellulitis.  Pharmacy has been consulted for Vancomycin  dosing.  Plan: Pt given Vancomycin  2500 mg once. Vancomycin  1000 mg IV Q MWF following dialysis. Goal AUC 400-550.  Pharmacy will continue to follow and will adjust abx dosing whenever warranted.  Temp (24hrs), Avg:98 F (36.7 C), Min:98 F (36.7 C), Max:98 F (36.7 C)   Recent Labs  Lab 03/30/24 2111  WBC 7.2  CREATININE 5.66*  LATICACIDVEN 1.0    Estimated Creatinine Clearance: 15.3 mL/min (A) (by C-G formula based on SCr of 5.66 mg/dL (H)).    Allergies  Allergen Reactions   Nifedipine Rash   Penicillins Hives, Shortness Of Breath and Swelling   Statins Shortness Of Breath    Wheezing   Hydralazine  Itching   Hydrocodone  Itching and Nausea Only   Ivp Dye [Iodinated Contrast Media]     ESRD   Metoprolol Diarrhea and Other (See Comments)    Pt states "make my heart race"   Oxycodone  Nausea Only   Codeine Nausea Only   Ibuprofen  Other (See Comments)    Raises blood pressure    Antimicrobials this admission: 5/27 Ceftriaxone  >>  5/28 Vancomycin  >>   Microbiology results: 5/27 BCx: Pending 5/27 WoundCx: Pending  Thank you for allowing pharmacy to be a part of this patient's care.  Coretta Dexter, PharmD, [facility] 03/31/2024 12:39 AM

## 2024-03-31 NOTE — Assessment & Plan Note (Signed)
 Continue basal insulin Sliding scale coverage

## 2024-03-31 NOTE — Plan of Care (Signed)
   Problem: Education: Goal: Ability to describe self-care measures that may prevent or decrease complications (Diabetes Survival Skills Education) will improve Outcome: Progressing

## 2024-04-01 DIAGNOSIS — G894 Chronic pain syndrome: Secondary | ICD-10-CM

## 2024-04-01 DIAGNOSIS — Z794 Long term (current) use of insulin: Secondary | ICD-10-CM

## 2024-04-01 DIAGNOSIS — I5032 Chronic diastolic (congestive) heart failure: Secondary | ICD-10-CM

## 2024-04-01 DIAGNOSIS — Z992 Dependence on renal dialysis: Secondary | ICD-10-CM

## 2024-04-01 DIAGNOSIS — E1151 Type 2 diabetes mellitus with diabetic peripheral angiopathy without gangrene: Secondary | ICD-10-CM

## 2024-04-01 DIAGNOSIS — I159 Secondary hypertension, unspecified: Secondary | ICD-10-CM

## 2024-04-01 DIAGNOSIS — N185 Chronic kidney disease, stage 5: Secondary | ICD-10-CM

## 2024-04-01 DIAGNOSIS — Z6841 Body Mass Index (BMI) 40.0 and over, adult: Secondary | ICD-10-CM

## 2024-04-01 DIAGNOSIS — T8130XA Disruption of wound, unspecified, initial encounter: Secondary | ICD-10-CM

## 2024-04-01 DIAGNOSIS — T8149XA Infection following a procedure, other surgical site, initial encounter: Secondary | ICD-10-CM | POA: Diagnosis not present

## 2024-04-01 DIAGNOSIS — Z9889 Other specified postprocedural states: Secondary | ICD-10-CM | POA: Diagnosis not present

## 2024-04-01 DIAGNOSIS — T148XXA Other injury of unspecified body region, initial encounter: Secondary | ICD-10-CM

## 2024-04-01 DIAGNOSIS — D631 Anemia in chronic kidney disease: Secondary | ICD-10-CM

## 2024-04-01 DIAGNOSIS — I739 Peripheral vascular disease, unspecified: Secondary | ICD-10-CM

## 2024-04-01 DIAGNOSIS — Z89512 Acquired absence of left leg below knee: Secondary | ICD-10-CM

## 2024-04-01 LAB — CBC WITH DIFFERENTIAL/PLATELET
Abs Immature Granulocytes: 0.02 10*3/uL (ref 0.00–0.07)
Basophils Absolute: 0 10*3/uL (ref 0.0–0.1)
Basophils Relative: 1 %
Eosinophils Absolute: 0.1 10*3/uL (ref 0.0–0.5)
Eosinophils Relative: 1 %
HCT: 33.1 % — ABNORMAL LOW (ref 36.0–46.0)
Hemoglobin: 10.4 g/dL — ABNORMAL LOW (ref 12.0–15.0)
Immature Granulocytes: 0 %
Lymphocytes Relative: 30 %
Lymphs Abs: 1.6 10*3/uL (ref 0.7–4.0)
MCH: 28.8 pg (ref 26.0–34.0)
MCHC: 31.4 g/dL (ref 30.0–36.0)
MCV: 91.7 fL (ref 80.0–100.0)
Monocytes Absolute: 0.6 10*3/uL (ref 0.1–1.0)
Monocytes Relative: 12 %
Neutro Abs: 3 10*3/uL (ref 1.7–7.7)
Neutrophils Relative %: 56 %
Platelets: 178 10*3/uL (ref 150–400)
RBC: 3.61 MIL/uL — ABNORMAL LOW (ref 3.87–5.11)
RDW: 14.1 % (ref 11.5–15.5)
WBC: 5.4 10*3/uL (ref 4.0–10.5)
nRBC: 0 % (ref 0.0–0.2)

## 2024-04-01 LAB — COMPREHENSIVE METABOLIC PANEL WITH GFR
ALT: 10 U/L (ref 0–44)
AST: 15 U/L (ref 15–41)
Albumin: 3.2 g/dL — ABNORMAL LOW (ref 3.5–5.0)
Alkaline Phosphatase: 92 U/L (ref 38–126)
Anion gap: 11 (ref 5–15)
BUN: 28 mg/dL — ABNORMAL HIGH (ref 8–23)
CO2: 27 mmol/L (ref 22–32)
Calcium: 8.5 mg/dL — ABNORMAL LOW (ref 8.9–10.3)
Chloride: 99 mmol/L (ref 98–111)
Creatinine, Ser: 4.62 mg/dL — ABNORMAL HIGH (ref 0.44–1.00)
GFR, Estimated: 10 mL/min — ABNORMAL LOW (ref >60)
Glucose, Bld: 232 mg/dL — ABNORMAL HIGH (ref 70–99)
Potassium: 4 mmol/L (ref 3.5–5.1)
Sodium: 137 mmol/L (ref 135–145)
Total Bilirubin: 0.5 mg/dL (ref 0.0–1.2)
Total Protein: 7.3 g/dL (ref 6.5–8.1)

## 2024-04-01 LAB — GLUCOSE, CAPILLARY
Glucose-Capillary: 202 mg/dL — ABNORMAL HIGH (ref 70–99)
Glucose-Capillary: 186 mg/dL — ABNORMAL HIGH (ref 70–99)
Glucose-Capillary: 147 mg/dL — ABNORMAL HIGH (ref 70–99)

## 2024-04-01 LAB — PHOSPHORUS: Phosphorus: 4.4 mg/dL (ref 2.5–4.6)

## 2024-04-01 LAB — MAGNESIUM: Magnesium: 2 mg/dL (ref 1.7–2.4)

## 2024-04-01 MED ORDER — CLINDAMYCIN HCL 300 MG PO CAPS: 300.0000 mg | ORAL_CAPSULE | Freq: Four times a day (QID) | ORAL | 0 refills | Status: DC

## 2024-04-01 MED ORDER — INSULIN DEGLUDEC 100 UNIT/ML ~~LOC~~ SOPN: 14.0000 [IU] | PEN_INJECTOR | Freq: Every day | SUBCUTANEOUS | Status: AC

## 2024-04-01 MED ORDER — COLLAGENASE 250 UNIT/GM EX OINT: TOPICAL_OINTMENT | Freq: Every day | CUTANEOUS | 0 refills | Status: DC

## 2024-04-01 NOTE — Plan of Care (Signed)
  Problem: Education: Goal: Ability to describe self-care measures that may prevent or decrease complications (Diabetes Survival Skills Education) will improve Outcome: Adequate for Discharge Goal: Individualized Educational Video(s) Outcome: Adequate for Discharge   Problem: Coping: Goal: Ability to adjust to condition or change in health will improve Outcome: Adequate for Discharge   Problem: Fluid Volume: Goal: Ability to maintain a balanced intake and output will improve Outcome: Adequate for Discharge   Problem: Health Behavior/Discharge Planning: Goal: Ability to identify and utilize available resources and services will improve Outcome: Adequate for Discharge Goal: Ability to manage health-related needs will improve Outcome: Adequate for Discharge   Problem: Metabolic: Goal: Ability to maintain appropriate glucose levels will improve Outcome: Adequate for Discharge   Problem: Nutritional: Goal: Maintenance of adequate nutrition will improve Outcome: Adequate for Discharge Goal: Progress toward achieving an optimal weight will improve Outcome: Adequate for Discharge   Problem: Skin Integrity: Goal: Risk for impaired skin integrity will decrease Outcome: Adequate for Discharge   Problem: Tissue Perfusion: Goal: Adequacy of tissue perfusion will improve Outcome: Adequate for Discharge   Problem: Education: Goal: Knowledge of General Education information will improve Description: Including pain rating scale, medication(s)/side effects and non-pharmacologic comfort measures Outcome: Adequate for Discharge   Problem: Health Behavior/Discharge Planning: Goal: Ability to manage health-related needs will improve Outcome: Adequate for Discharge   Problem: Clinical Measurements: Goal: Ability to maintain clinical measurements within normal limits will improve Outcome: Adequate for Discharge Goal: Will remain free from infection Outcome: Adequate for Discharge Goal:  Diagnostic test results will improve Outcome: Adequate for Discharge Goal: Respiratory complications will improve Outcome: Adequate for Discharge Goal: Cardiovascular complication will be avoided Outcome: Adequate for Discharge   Problem: Activity: Goal: Risk for activity intolerance will decrease Outcome: Adequate for Discharge   Problem: Nutrition: Goal: Adequate nutrition will be maintained Outcome: Adequate for Discharge   Problem: Coping: Goal: Level of anxiety will decrease Outcome: Adequate for Discharge   Problem: Elimination: Goal: Will not experience complications related to bowel motility Outcome: Adequate for Discharge Goal: Will not experience complications related to urinary retention Outcome: Adequate for Discharge   Problem: Pain Managment: Goal: General experience of comfort will improve and/or be controlled Outcome: Adequate for Discharge   Problem: Safety: Goal: Ability to remain free from injury will improve Outcome: Adequate for Discharge   Problem: Skin Integrity: Goal: Risk for impaired skin integrity will decrease Outcome: Adequate for Discharge   Problem: Clinical Measurements: Goal: Ability to avoid or minimize complications of infection will improve Outcome: Adequate for Discharge   Problem: Skin Integrity: Goal: Skin integrity will improve Outcome: Adequate for Discharge

## 2024-04-01 NOTE — Progress Notes (Signed)
 [facility]  ROUNDING NOTE   Subjective:   Rachel Holmes is a 63 y.o. female with past medical conditions including anemia, anxiety, CHF, diabetes, diabetic nephropathy, GERD, hyperlipidemia, legally blind in left eye, and end-stage renal disease on hemodialysis.  Patient presented to the emergency department for wound evaluation. She has been admitted for Surgical wound infection [T81.49XA] Wound infection [T14.8XXA, L08.9]  Patient is known to our practice and receives outpatient dialysis at [facility]  on a MWF schedule.   Update Patient laying in bed No family present Alert Denies discomfort   Objective:  Vital signs in last 24 hours:  Temp:  [98.4 F (36.9 C)-99.6 F (37.6 C)] 98.4 F (36.9 C) (05/29 0829) Pulse Rate:  [76-97] 84 (05/29 0829) Resp:  [10-25] 10 (05/29 0829) BP: (94-194)/(32-162) 157/80 (05/29 0829) SpO2:  [94 %-100 %] 95 % (05/29 0829) Weight:  [140.8 kg] 140.8 kg (05/28 1347)  Weight change:  Filed Weights   03/31/24 0904 03/31/24 1347  Weight: (!) 140.9 kg (!) 140.8 kg    Intake/Output: I/O last 3 completed shifts: In: 100 [IV Piggyback:100] Out: 2200 [Urine:700; Other:1500]   Intake/Output this shift:  No intake/output data recorded.  Physical Exam: General: NAD  Head: Normocephalic, atraumatic. Moist oral mucosal membranes  Eyes: Anicteric  Lungs:  Clear to auscultation, normal effort  Heart: Regular rate and rhythm  Abdomen:  Soft, nontender  Extremities:  No peripheral edema.  Neurologic: Alert, moving all four extremities  Skin: Rt ankle wound  Access: Lt AVF    Basic Metabolic Panel: Recent Labs  Lab 03/30/24 2111 03/31/24 0921 04/01/24 0454  NA 134* 135 137  K 4.0 4.3 4.0  CL 98 98 99  CO2 24 29 27   GLUCOSE 154* 231* 232*  BUN 41* 46* 28*  CREATININE 5.66* 5.87* 4.62*  CALCIUM 8.6* 8.5* 8.5*  MG  --   --  2.0  PHOS  --  4.1 4.4    Liver Function Tests: Recent Labs  Lab 03/31/24 0921  04/01/24 0454  AST  --  15  ALT  --  10  ALKPHOS  --  92  BILITOT  --  0.5  PROT  --  7.3  ALBUMIN  3.0* 3.2*   No results for input(s): "LIPASE", "AMYLASE" in the last 168 hours. No results for input(s): "AMMONIA" in the last 168 hours.  CBC: Recent Labs  Lab 03/30/24 2111 03/31/24 0921 04/01/24 0454  WBC 7.2 5.2 5.4  NEUTROABS 3.6  --  3.0  HGB 10.3* 9.6* 10.4*  HCT 33.6* 31.8* 33.1*  MCV 93.6 93.3 91.7  PLT 169 160 178    Cardiac Enzymes: No results for input(s): "CKTOTAL", "CKMB", "CKMBINDEX", "TROPONINI" in the last 168 hours.  BNP: Invalid input(s): "POCBNP"  CBG: Recent Labs  Lab 03/31/24 1747 03/31/24 2229 04/01/24 0347 04/01/24 0851 04/01/24 1203  GLUCAP 199* 245* 202* 186* 147*    Microbiology: Results for orders placed or performed during the hospital encounter of 03/30/24  Culture, blood (routine x 2)     Status: None (Preliminary result)   Collection Time: 03/30/24  9:11 PM   Specimen: BLOOD  Result Value Ref Range Status   Specimen Description BLOOD RIGHT HAND  Final   Special Requests   Final    BOTTLES DRAWN AEROBIC AND ANAEROBIC Blood Culture results may not be optimal due to an inadequate volume of blood received in culture bottles   Culture   Final    NO GROWTH 2 DAYS  Performed at [facility], [address] [place], [place] [address]    Report Status PENDING  Incomplete  Culture, blood (routine x 2)     Status: None (Preliminary result)   Collection Time: 03/30/24  9:38 PM   Specimen: BLOOD  Result Value Ref Range Status   Specimen Description BLOOD BLOOD RIGHT HAND  Final   Special Requests   Final    BOTTLES DRAWN AEROBIC AND ANAEROBIC Blood Culture results may not be optimal due to an inadequate volume of blood received in culture bottles   Culture   Final    NO GROWTH 2 DAYS Performed at [facility], [address] [place], [place] [address]    Report Status PENDING  Incomplete    Coagulation  Studies: No results for input(s): "LABPROT", "INR" in the last 72 hours.  Urinalysis: No results for input(s): "COLORURINE", "LABSPEC", "PHURINE", "GLUCOSEU", "HGBUR", "BILIRUBINUR", "KETONESUR", "PROTEINUR", "UROBILINOGEN", "NITRITE", "LEUKOCYTESUR" in the last 72 hours.  Invalid input(s): "APPERANCEUR"    Imaging: CT Ankle Right Wo Contrast Result Date: 03/30/2024 CLINICAL DATA:  Surgical incision opened 4 days ago. Purulence drainage. Query osteomyelitis EXAM: CT OF THE RIGHT ANKLE WITHOUT CONTRAST TECHNIQUE: Multidetector CT imaging of the right ankle was performed according to the standard protocol. Multiplanar CT image reconstructions were also generated. RADIATION DOSE REDUCTION: This exam was performed according to the departmental dose-optimization program which includes automated exposure control, adjustment of the mA and/or kV according to patient size and/or use of iterative reconstruction technique. COMPARISON:  Ankle radiograph earlier today FINDINGS: Bones/Joint/Cartilage Plate and screw fixation of the distal fibular fracture. Screw fixation of the medial malleolus fracture. Periosteal new bone formation about the fracture lines. Heterogenous sclerosis within the distal tibia and fibula may be due to healing change though osteomyelitis is not excluded. Healing posterior malleolus fracture. Advanced arthritis in the midfoot. Ligaments Suboptimally assessed by CT. Muscles and Tendons Grossly intact. Soft tissues Diffuse soft tissue swelling about the ankle. Soft tissue ulcer along the lateral aspect of the ankle superficial to the fibular fixation hardware. No organized fluid collection. No soft tissue gas. IMPRESSION: 1. Soft tissue ulcer along the lateral aspect of the ankle superficial to the fibular fixation hardware. No organized fluid collection. No soft tissue gas. 2. Heterogenous sclerosis within the distal tibia and fibula may be due to healing change though osteomyelitis is not  excluded. 3. Diffuse soft tissue swelling about the ankle. Electronically Signed   By: Rozell Cornet M.D.   On: 03/30/2024 23:36   DG Ankle Complete Right Result Date: 03/30/2024 CLINICAL DATA:  Wound infection EXAM: RIGHT ANKLE - COMPLETE 3+ VIEW COMPARISON:  02/26/2024, 02/04/2024, 01/14/2024, 12/18/2023 FINDINGS: Stable surgical plate and screw fixation of the distal fibula with 2 long distal tibiofibular syndesmosis fixating screws. Fixating screws at medial malleolar fracture. Similar small amount of periosteal new bone formation but with visible fracture lucency at the medial malleolus. Mild medial downsloping of talar dome as before. Interim finding of large wound or ulcer lateral aspect of the ankle. Generalized soft tissue swelling. Vascular calcifications. Moderate plantar calcaneal spur. Moderate degenerative changes of the midfoot IMPRESSION: 1. Interim finding of large wound or ulcer lateral aspect of the ankle. Generalized soft tissue swelling. 2. Stable surgical fixation of the distal fibula and medial malleolus. Similar small amount of periosteal new bone formation but with residual visible fracture lucencies. Electronically Signed   By: Esmeralda Hedge M.D.   On: 03/30/2024 21:22     Medications:  cefTRIAXone  (ROCEPHIN )  IV 2 g (03/31/24 2150)   vancomycin       aspirin  EC  81 mg Oral Daily   calcitRIOL  0.25 mcg Oral Q M,W,F-HD   Chlorhexidine  Gluconate Cloth  6 each Topical Q0600   collagenase   Topical Daily   DULoxetine   30 mg Oral QHS   ezetimibe   10 mg Oral Daily   gabapentin   600 mg Oral QHS   heparin   5,000 Units Subcutaneous Q8H   insulin  aspart  0-5 Units Subcutaneous QHS   insulin  aspart  0-6 Units Subcutaneous TID WC   insulin  glargine-yfgn  14 Units Subcutaneous QHS   isosorbide  mononitrate  120 mg Oral Daily   magnesium  oxide  400 mg Oral QHS   pantoprazole   40 mg Oral Daily   torsemide   100 mg Oral BID   acetaminophen  **OR** acetaminophen , albuterol ,  HYDROmorphone  (DILAUDID ) injection, hydrOXYzine, lactulose , ondansetron  **OR** ondansetron  (ZOFRAN ) IV, oxyCODONE   Assessment/ Plan:  Ms. AMEERAH HUFFSTETLER is a 63 y.o.  female with anemia, anxiety, CHF, diabetes, diabetic nephropathy, GERD, hyperlipidemia, legally blind in left eye, and end-stage renal disease on hemodialysis. Presents to ED for wound evaluation and has been admitted for Surgical wound infection [T81.49XA] Wound infection [T14.8XXA, L08.9]  [facility]/MWF/left aVF/142.5 kg   Wound dehiscence with infection. Right ankle ORIF in March. Blood and wound cultures pending. Remains on Vancomycin  and Ceftriazone.   2. End stage renal disease on hemodialysis. Received dialysis yesterday, UF 1.5L. Next treatment scheduled for Friday.   3. Anemia of chronic kidney disease Lab Results  Component Value Date   HGB 10.4 (L) 04/01/2024  Receives Mircera outpatient Hgb at goal  4. Secondary Hyperparathyroidism: with outpatient labs: PTH 320, phosphorus 4.4, calcium 8.1 on 03/12/24.    Lab Results  Component Value Date   CALCIUM 8.5 (L) 04/01/2024   CAION 1.05 (L) 01/22/2024   PHOS 4.4 04/01/2024    Will continue to monitor bone minerals during this admission.   Prescribed calcitriol outpatient.   5. Hypertension with chronic kidney disease. Home regimen includes clonidine , doxazosin , olmasartan, and torsemide . Receiving isosorbide  and torsemide  only.    LOS: 0 Jalin Alicea 5/29/202512:37 PM

## 2024-04-01 NOTE — Discharge Summary (Signed)
 Physician Discharge Summary   Patient: Rachel Holmes MRN: [id number] DOB: 04-10-1961  Admit date:     03/30/2024  Discharge date: 04/01/24  Discharge Physician: Roise Cleaver   PCP: Alexander Anes, MD   Recommendations at discharge:   Follow-up with wound care clinic Follow-up with orthopedic surgery Home health ordered for wound care  Discharge Diagnoses: Principal Problem:   Surgical wound infection, with wound dehiscence Active Problems:   Hx of BKA, left (HCC)   Wound dehiscence   History of open reduction and internal fixation (ORIF) right ankle 01/2024   Hypertension   ESRD on hemodialysis (HCC)   Anemia of chronic kidney failure, stage 5 (HCC)   Morbid obesity with BMI of 45.0-49.9, adult (HCC)   Chronic diastolic CHF (congestive heart failure) (HCC)   Type 2 diabetes mellitus with diabetic peripheral angiopathy without gangrene, with long-term current use of insulin  (HCC)   PVD (peripheral vascular disease) (HCC)   Chronic pain syndrome  Resolved Problems:   * No resolved hospital problems. [facility] Course Rachel Holmes is a 63 year old female with ESRD on HD MWF, diabetes, hypertension, heart failure reduced EF, chronic anemia, anxiety, left BKA, history of ORIF to the right ankle in March 2025 for bimalleolar fracture complicated by wound dehiscence a month later.  She has been managed in the clinic by orthopedic surgery and was admitted on this admission due to suspected new infection of her dehisced surgical wound.   She reports 1 week prior to arrival she noted that the wound had more serosanguineous drainage than before.  She did complete a 2-week course of Bactrim  in late April.  Per most recent orthopedic appointment on 5/22 she was referred to wound care clinic.  On arrival to the ED she had no leukocytosis, normal lactic acid, sed rate mildly elevated at 63.  Vital signs mostly unremarkable. CT of right ankle revealed soft tissue ulcer along the  lateral aspect of the ankle with no organized fluid collection.  There are some heterogenous sclerosis within the distal tibia and fibula and diffuse soft tissue swelling about the ankle. Discussed CT findings with orthopedic surgery who believes that this is reactive postoperative changes and unlikely to be osteomyelitis.  He has recommended for continued p.o. antibiotics and follow-up with wound care.  Wound RN was consulted during this admission and recommended Santyl and daily wound care changes.  Orthopedic surgery also requested for podiatry review for wound care recommendations, but ultimately they agree with Santyl and daily wound care.  Patient will be discharged home today with 10 days of clindamycin .  She will require close outpatient follow-up with her orthopedic surgeon.  Referral has been placed to the wound care center.  Home health was ordered prior to discharge  While admitted nephrology was consulted to maintain the patient on her weekly hemodialysis.  She was also evaluated and treated for her chronic medical conditions including: ESRD  Anemia of ESRD Hypertension Morbid obesity BMI 47 Hypertension Chronic diastolic heart failure Type 2 diabetes with diabetic peripheral angiopathy without gangrene, with long-term use of insulin  Peripheral vascular disease Chronic pain syndrome - All home medications to be resumed at discharge.  Consultants: Orthopedic Surgery, Podiatry, Nephrology, Wound Care RN Procedures performed: n/a  Disposition: Home Diet recommendation:  Discharge Diet Orders (From admission, onward)     Start     Ordered   04/01/24 0000  Diet general        04/01/24 1510  Renal diet  Discharge Instructions     Ambulatory referral to Wound Clinic   Complete by: As directed    Call MD for:  difficulty breathing, headache or visual disturbances   Complete by: As directed    Call MD for:  persistant dizziness or light-headedness   Complete by:  As directed    Call MD for:  persistant nausea and vomiting   Complete by: As directed    Call MD for:  severe uncontrolled pain   Complete by: As directed    Call MD for:  temperature >100.4   Complete by: As directed    Diet general   Complete by: As directed    Discharge wound care:   Complete by: As directed    Wound care  Daily      Comments: Cleanse wound to right lateral malleolus with NS and pat dry.  Apply Santyl to wound bed. Cover with NS moist gauze.  Top with dry gauze, kerlix and tape.  Change daily.   Increase activity slowly   Complete by: As directed         DISCHARGE MEDICATION: Allergies as of 04/01/2024       Reactions   Nifedipine Rash   Penicillins Hives, Shortness Of Breath, Swelling   Statins Shortness Of Breath   Wheezing   Hydralazine  Itching   Hydrocodone  Itching, Nausea Only   Iodinated Contrast Media Other (See Comments)   ESRD   Metoprolol Diarrhea, Other (See Comments)   Pt states "make my heart race"   Oxycodone  Nausea Only   Codeine Nausea Only   Ibuprofen  Other (See Comments)   Raises blood pressure        Medication List     TAKE these medications    acetaminophen  500 MG tablet Commonly known as: TYLENOL  Take 2 tablets (1,000 mg total) by mouth every 6 (six) hours as needed.   albuterol  108 (90 Base) MCG/ACT inhaler Commonly known as: VENTOLIN  HFA Inhale 1-2 puffs into the lungs every 6 (six) hours as needed for wheezing or shortness of breath.   aspirin  EC 81 MG tablet Take 81 mg by mouth daily. Swallow whole.   calcitRIOL 0.25 MCG capsule Commonly known as: ROCALTROL Take 0.25 mcg by mouth every Monday, Wednesday, and Friday with hemodialysis.   cetirizine 10 MG tablet Commonly known as: ZYRTEC Take 10 mg by mouth at bedtime.   clindamycin  300 MG capsule Commonly known as: CLEOCIN  Take 1 capsule (300 mg total) by mouth 4 (four) times daily for 10 days.   clobetasol cream 0.05 % Commonly known as:  TEMOVATE Apply 1 application  topically 2 (two) times daily as needed (rash).   cloNIDine  0.3 MG tablet Commonly known as: CATAPRES  Take 1 tablet (0.3 mg total) by mouth 2 (two) times daily.   collagenase 250 UNIT/GM ointment Commonly known as: SANTYL Apply topically daily. Start taking on: Apr 02, 2024   cyanocobalamin  1000 MCG tablet Commonly known as: VITAMIN B12 Take 1,000 mcg by mouth daily.   docusate sodium  100 MG capsule Commonly known as: COLACE Take 1 capsule (100 mg total) by mouth 2 (two) times daily.   doxazosin  8 MG tablet Commonly known as: CARDURA  Take 8 mg by mouth daily.   DULoxetine  30 MG capsule Commonly known as: CYMBALTA  Take 30 mg by mouth at bedtime.   ezetimibe  10 MG tablet Commonly known as: ZETIA  Take 1 tablet (10 mg total) by mouth daily.   FreeStyle Libre 14 Day Reader Seymour Dapper USE  AS DIRECTED   gabapentin  600 MG tablet Commonly known as: NEURONTIN  Take 600 mg by mouth at bedtime.   glucosamine-chondroitin 500-400 MG tablet Take 1 tablet by mouth in the morning and at bedtime.   hydrOXYzine  25 MG tablet Commonly known as: ATARAX  Take 25 mg by mouth every 6 (six) hours as needed for itching.   insulin  degludec 100 UNIT/ML FlexTouch Pen Commonly known as: TRESIBA  Inject 14 Units into the skin at bedtime.   isosorbide  mononitrate 120 MG 24 hr tablet Commonly known as: IMDUR  Take 1 tablet (120 mg total) by mouth in the morning and at bedtime.   lactulose  10 GM/15ML solution Commonly known as: CHRONULAC  Take 15 mLs by mouth daily as needed for moderate constipation.   Lyumjev  KwikPen 100 UNIT/ML KwikPen Generic drug: Insulin  Lispro-aabc Inject 10-20 Units into the skin 3 (three) times daily before meals. With meals   Magnesium  400 MG Caps Take 400 mg by mouth at bedtime.   Mounjaro 10 MG/0.5ML Pen Generic drug: tirzepatide Inject 10 mg into the skin once a week.   multivitamin with minerals Tabs tablet Take 1 tablet by mouth  daily.   olmesartan 40 MG tablet Commonly known as: BENICAR Take 40 mg by mouth daily.   omeprazole 40 MG capsule Commonly known as: PRILOSEC Take 40 mg by mouth 2 (two) times daily with breakfast and lunch.   onetouch ultrasoft lancets Used to check blood sugars four times daily.   OneTouch Verio test strip Generic drug: glucose blood USE TO CHECK BLOOD SUGAR TWICE DAILY   torsemide  100 MG tablet Commonly known as: DEMADEX  Take 1 tablet (100 mg total) by mouth 2 (two) times daily.               Discharge Care Instructions  (From admission, onward)           Start     Ordered   04/01/24 0000  Discharge wound care:       Comments: Wound care  Daily      Comments: Cleanse wound to right lateral malleolus with NS and pat dry.  Apply Santyl  to wound bed. Cover with NS moist gauze.  Top with dry gauze, kerlix and tape.  Change daily.   04/01/24 1510            Discharge Exam: Filed Weights   03/31/24 0904 03/31/24 1347  Weight: (!) 140.9 kg (!) 140.8 kg   Constitutional:  Normal appearance. Non toxic-appearing.  Morbidly obese HENT: Head Normocephalic and atraumatic.  Mucous membranes are moist.  Eyes:  Extraocular intact. Conjunctivae normal. Pupils are equal, round, and reactive to light.  Cardiovascular: Rate and Rhythm: Normal rate and regular rhythm.  Pulmonary: Non labored, symmetric rise of chest wall.  Musculoskeletal: Ankle wound with serosanguineous drainage, and no active purulence, no bleeding. Neurological: No focal deficit present. alert. Oriented. Psychiatric: Mood and Affect congruent.      Condition at discharge: stable  The results of significant diagnostics from this hospitalization (including imaging, microbiology, ancillary and laboratory) are listed below for reference.   Imaging Studies: CT Ankle Right Wo Contrast Result Date: 03/30/2024 CLINICAL DATA:  Surgical incision opened 4 days ago. Purulence drainage. Query osteomyelitis  EXAM: CT OF THE RIGHT ANKLE WITHOUT CONTRAST TECHNIQUE: Multidetector CT imaging of the right ankle was performed according to the standard protocol. Multiplanar CT image reconstructions were also generated. RADIATION DOSE REDUCTION: This exam was performed according to the departmental dose-optimization program which includes automated exposure control, adjustment  of the mA and/or kV according to patient size and/or use of iterative reconstruction technique. COMPARISON:  Ankle radiograph earlier today FINDINGS: Bones/Joint/Cartilage Plate and screw fixation of the distal fibular fracture. Screw fixation of the medial malleolus fracture. Periosteal new bone formation about the fracture lines. Heterogenous sclerosis within the distal tibia and fibula may be due to healing change though osteomyelitis is not excluded. Healing posterior malleolus fracture. Advanced arthritis in the midfoot. Ligaments Suboptimally assessed by CT. Muscles and Tendons Grossly intact. Soft tissues Diffuse soft tissue swelling about the ankle. Soft tissue ulcer along the lateral aspect of the ankle superficial to the fibular fixation hardware. No organized fluid collection. No soft tissue gas. IMPRESSION: 1. Soft tissue ulcer along the lateral aspect of the ankle superficial to the fibular fixation hardware. No organized fluid collection. No soft tissue gas. 2. Heterogenous sclerosis within the distal tibia and fibula may be due to healing change though osteomyelitis is not excluded. 3. Diffuse soft tissue swelling about the ankle. Electronically Signed   By: Rozell Cornet M.D.   On: 03/30/2024 23:36   DG Ankle Complete Right Result Date: 03/30/2024 CLINICAL DATA:  Wound infection EXAM: RIGHT ANKLE - COMPLETE 3+ VIEW COMPARISON:  02/26/2024, 02/04/2024, 01/14/2024, 12/18/2023 FINDINGS: Stable surgical plate and screw fixation of the distal fibula with 2 long distal tibiofibular syndesmosis fixating screws. Fixating screws at medial  malleolar fracture. Similar small amount of periosteal new bone formation but with visible fracture lucency at the medial malleolus. Mild medial downsloping of talar dome as before. Interim finding of large wound or ulcer lateral aspect of the ankle. Generalized soft tissue swelling. Vascular calcifications. Moderate plantar calcaneal spur. Moderate degenerative changes of the midfoot IMPRESSION: 1. Interim finding of large wound or ulcer lateral aspect of the ankle. Generalized soft tissue swelling. 2. Stable surgical fixation of the distal fibula and medial malleolus. Similar small amount of periosteal new bone formation but with residual visible fracture lucencies. Electronically Signed   By: Esmeralda Hedge M.D.   On: 03/30/2024 21:22    Microbiology: Results for orders placed or performed during the hospital encounter of 03/30/24  Culture, blood (routine x 2)     Status: None (Preliminary result)   Collection Time: 03/30/24  9:11 PM   Specimen: BLOOD  Result Value Ref Range Status   Specimen Description BLOOD RIGHT HAND  Final   Special Requests   Final    BOTTLES DRAWN AEROBIC AND ANAEROBIC Blood Culture results may not be optimal due to an inadequate volume of blood received in culture bottles   Culture   Final    NO GROWTH 2 DAYS Performed at [facility], [address], [place], [place] [address]    Report Status PENDING  Incomplete  Culture, blood (routine x 2)     Status: None (Preliminary result)   Collection Time: 03/30/24  9:38 PM   Specimen: BLOOD  Result Value Ref Range Status   Specimen Description BLOOD BLOOD RIGHT HAND  Final   Special Requests   Final    BOTTLES DRAWN AEROBIC AND ANAEROBIC Blood Culture results may not be optimal due to an inadequate volume of blood received in culture bottles   Culture   Final    NO GROWTH 2 DAYS Performed at [facility], [address], [place], [place] [address]    Report Status PENDING  Incomplete     Labs: CBC: Recent Labs  Lab 03/30/24 2111 03/31/24 0921 04/01/24 0454  WBC 7.2 5.2  5.4  NEUTROABS 3.6  --  3.0  HGB 10.3* 9.6* 10.4*  HCT 33.6* 31.8* 33.1*  MCV 93.6 93.3 91.7  PLT 169 160 178   Basic Metabolic Panel: Recent Labs  Lab 03/30/24 2111 03/31/24 0921 04/01/24 0454  NA 134* 135 137  K 4.0 4.3 4.0  CL 98 98 99  CO2 24 29 27   GLUCOSE 154* 231* 232*  BUN 41* 46* 28*  CREATININE 5.66* 5.87* 4.62*  CALCIUM 8.6* 8.5* 8.5*  MG  --   --  2.0  PHOS  --  4.1 4.4   Liver Function Tests: Recent Labs  Lab 03/31/24 0921 04/01/24 0454  AST  --  15  ALT  --  10  ALKPHOS  --  92  BILITOT  --  0.5  PROT  --  7.3  ALBUMIN  3.0* 3.2*   CBG: Recent Labs  Lab 03/31/24 1747 03/31/24 2229 04/01/24 0347 04/01/24 0851 04/01/24 1203  GLUCAP 199* 245* 202* 186* 147*    Discharge time spent: 32 minutes.  Signed: Jaydan Meidinger, DO [facility] 04/01/2024

## 2024-04-01 NOTE — Care Management Obs Status (Signed)
 MEDICARE OBSERVATION STATUS NOTIFICATION   Patient Details  Name: AIMY SWEETING MRN: [id number] Date of Birth: 04-19-1961   Medicare Observation Status Notification Given:  Yes    Anise Kerns 04/01/2024, 10:20 AM

## 2024-04-01 NOTE — Inpatient Diabetes Management (Signed)
 Inpatient Diabetes Program Recommendations  AACE/ADA: New Consensus Statement on Inpatient Glycemic Control  Target Ranges:  Prepandial:   less than 140 mg/dL      Peak postprandial:   less than 180 mg/dL (1-2 hours)      Critically ill patients:  140 - 180 mg/dL    Latest Reference Range & Units 03/31/24 07:28 03/31/24 14:20 03/31/24 15:44 03/31/24 17:47 03/31/24 22:29 04/01/24 03:47  Glucose-Capillary 70 - 99 mg/dL 564 (H) 332 (H) 951 (H) 199 (H) 245 (H) 202 (H)   Review of Glycemic Control  Diabetes history: DM2 Outpatient Diabetes medications: Lymjev 10-20 units TID, Mounjaro 10 mg Qweek Current orders for Inpatient glycemic control: Semglee  14 units at bedtime, Novolog  0-6 units TID with meals, Novolog  0-5 units QHS  Inpatient Diabetes Program Recommendations:    Insulin : Please consider increasing Semglee  to 16 units at bedtime and adding Novolog  2 units TID with meals for meal coverage if patient eats at least 50% of meals.  Thanks, Beacher Limerick, RN, MSN, CDCES Diabetes Coordinator Inpatient Diabetes Program [phone] (Team Pager from 8am to 5pm)

## 2024-04-01 NOTE — TOC Transition Note (Signed)
 Transition of Care [facility]) - Discharge Note   Patient Details  Name: Rachel Holmes MRN: [id number] Date of Birth: [birth year]  [facility] of [facility]) CM/SW Contact:  Odilia Bennett, LCSW Phone Number: 04/01/2024, 3:48 PM   Clinical Narrative:  Patient has orders to discharge home today. MD put in home health orders for PT and aide. Patient is agreeable No agency preference. [facility] has accepted for RN and OT assistant (in place of aide). Patient is aware and agreeable. She will call herself an [facility] to get home. No further concerns. CSW signing off.   Final next level of care: Home w Home Health Services Barriers to Discharge: No Barriers Identified   Patient Goals and CMS Choice   CMS Medicare.gov Compare Post Acute Care list provided to:: Patient Choice offered to / list presented to : Patient      Discharge Placement                Patient to be transferred to facility by: [facility]   Patient and family notified of of transfer: 04/01/24  Discharge Plan and Services Additional resources added to the After Visit Summary for                            [facility] Arranged: RN [facility] Agency: [facility] Date [facility] Agency Contacted: 04/01/24   Representative spoke with at [facility] Agency: Bartholomew Light  Social Drivers of Health (SDOH) Interventions SDOH Screenings   Food Insecurity: No Food Insecurity (03/31/2024)  Housing: Low Risk  (03/31/2024)  Transportation Needs: No Transportation Needs (03/31/2024)  Utilities: Not At Risk (03/31/2024)  Alcohol Screen: Low Risk  (07/13/2019)  Depression (PHQ2-9): Low Risk  (08/06/2023)  Financial Resource Strain: High Risk (08/13/2023)   Received from [facility]  Physical Activity: Insufficiently Active (08/13/2023)   Received from [facility]  Social Connections: Moderately Integrated (03/31/2024)  Stress: Stress Concern Present (08/13/2023)   Received from [facility]  Tobacco Use:  Low Risk  (03/30/2024)  Health Literacy: Adequate Health Literacy (08/13/2023)   Received from [facility]     Readmission Risk Interventions     No data to display

## 2024-04-01 NOTE — Progress Notes (Signed)
   ORTHOPAEDIC PROGRESS NOTE  s/p ORIF R ankle with lateral ankle wound dehiscence  DOS: 01/22/2024  SUBJECTIVE: Patient has been followed closely in clinic.  Continues to have lateral based ankle wound dehiscence.  She has been diligent with dressing changes, but noticed some worsening drainage and increasing pain.  No fevers or chills.  She was wearing a boot at home, limited weightbearing.  Presented to ED via EMS.   OBJECTIVE: PE:  Alert and oriented, no acute distress  Lateral ankle wound is healthy appearing Granulation tissue at the base of the wound, small amount of fibrinous tissue No hardware is visible Clear drainage, no purulence Medial incision is clean and dry Mild diffuse swelling No Fluctuance    Vitals:   03/31/24 2042 04/01/24 0412  BP: (!) 155/64 (!) 194/162  Pulse: 95 97  Resp: 17 18  Temp: 99.6 F (37.6 C) 99.1 F (37.3 C)  SpO2: 97% 94%     ASSESSMENT: Rachel Holmes is a 63 y.o. female s/p ORIF of right ankle fracture with lateral wound dehiscence.  Stable.  Low concern for worsening infection.  Continue antibiotics.    PLAN: Weightbearing: Can bear weight for transfers if wearing boot Incisional and dressing care: Appreciate wound care consult and recommendations Orthopedic device(s): CAM boot VTE prophylaxis: At discretion of primary team Pain control: As needed Follow - up plan: 1 week upon discharge  Rachel Holmes has vascular disease, diabetes and is ESRD on dialysis.  All of these conditions decrease her healing capacity.  She has a L BKA and would like to avoid a similar procedure on the right.  However, if this right ankle wound will not heal, this has to be a consideration.  She is aware.  Vascular surgery completed her left BKA, and we can consider evaluation as a second opinion.  Continue with wound care as directed and antibiotics.  I will see her in clinic within 1-2 weeks of discharge.  No surgical intervention warranted at this  time.    Contact information:     Rachel Holmes A. Ernesta Heading, MD MS [facility] [address] [place],  [place]  [address] Phone: [phone] Fax: [phone]

## 2024-04-04 LAB — CULTURE, BLOOD (ROUTINE X 2)
Culture: NO GROWTH
Culture: NO GROWTH

## 2024-04-05 ENCOUNTER — Telehealth: Payer: Self-pay

## 2024-04-05 NOTE — Telephone Encounter (Signed)
 Patient called stating that the antibiotics that she was given are to strong and are making her vomit.  Would like a call to discuss?  CB# [phone].  Please advise.  Thank you

## 2024-04-06 LAB — AEROBIC/ANAEROBIC CULTURE W GRAM STAIN (SURGICAL/DEEP WOUND)

## 2024-04-06 MED ORDER — SULFAMETHOXAZOLE-TRIMETHOPRIM 800-160 MG PO TABS: 1.0000 | ORAL_TABLET | Freq: Two times a day (BID) | ORAL | 0 refills | Status: AC

## 2024-04-06 NOTE — Addendum Note (Signed)
 Addended by: Sharol Decamp A on: 04/06/2024 04:40 PM   Modules accepted: Orders

## 2024-04-07 ENCOUNTER — Ambulatory Visit: Admitting: Orthopedic Surgery

## 2024-04-09 ENCOUNTER — Telehealth: Payer: Self-pay | Admitting: Orthopedic Surgery

## 2024-04-09 ENCOUNTER — Other Ambulatory Visit: Payer: Self-pay | Admitting: Orthopedic Surgery

## 2024-04-09 MED ORDER — CIPROFLOXACIN HCL 500 MG PO TABS: 500.0000 mg | ORAL_TABLET | Freq: Two times a day (BID) | ORAL | 1 refills | Status: AC

## 2024-04-09 NOTE — Telephone Encounter (Signed)
 Patient called. Says [facility] did not receive a referral for home health services.

## 2024-04-14 ENCOUNTER — Other Ambulatory Visit: Payer: Self-pay | Admitting: Orthopedic Surgery

## 2024-04-14 ENCOUNTER — Ambulatory Visit: Admitting: Orthopedic Surgery

## 2024-04-14 ENCOUNTER — Encounter: Payer: Self-pay | Admitting: Orthopedic Surgery

## 2024-04-14 DIAGNOSIS — S82831A Other fracture of upper and lower end of right fibula, initial encounter for closed fracture: Secondary | ICD-10-CM

## 2024-04-14 DIAGNOSIS — T8130XA Disruption of wound, unspecified, initial encounter: Secondary | ICD-10-CM

## 2024-04-14 DIAGNOSIS — S82841G Displaced bimalleolar fracture of right lower leg, subsequent encounter for closed fracture with delayed healing: Secondary | ICD-10-CM

## 2024-04-14 MED ORDER — SANTYL 250 UNIT/GM EX OINT: 1.0000 | TOPICAL_OINTMENT | Freq: Every day | CUTANEOUS | 0 refills | Status: DC

## 2024-04-14 NOTE — Patient Instructions (Signed)
 New prescription for Santyl  has been provided.  Please pick this up at the pharmacy  Updated prescription for ciprofloxacin  (antibiotic) has been sent to the pharmacy.  This is based on the culture data from the hospital.  Continue with daily dressing changes  We have sent in an updated referral for home health, to assist with wound care, and physical therapy.  You have an appointment scheduled with wound care within the next few weeks.  Follow-up in 2 weeks  Contact the clinic if he have any issues before then.

## 2024-04-14 NOTE — Progress Notes (Signed)
 Orthopaedic Postop Note  Assessment: HARBOR PASTER is a 63 y.o. female s/p ORIF of Right ankle fracture  DOS: 01/22/2024  Lateral wound dehiscence  Plan: Lateral wound overall looks better than it did when she was in the hospital.  There is some drainage.  I have reminded her that I sent in an updated prescription of ciprofloxacin , which will be helpful based on the cultures obtained when she was hospitalized.  Continue with wound care using Santyl .  New prescription has been provided.  We have updated the referral for home health, to assist with wound care, as well as some therapy.  Dressing was applied.  Continue to use the boot.  I would like see her back in 2 weeks.  She will see wound care within the next few weeks as well.  As long as she remains healthy otherwise, we will not need to consider any type of surgery.  We will continue to work diligently to try and get this to heal, so she does not have to consider revision surgery, or any type of amputation.  I would like to see her back in 2 weeks for repeat evaluation.  She will contact the clinic if she has any issues.   Follow-up: No follow-ups on file. XR at next visit: Right ankle  Subjective:  No chief complaint on file.   History of Present Illness: SHALIE SCHREMP is a 63 y.o. female who returns following the above stated procedure.  Surgery was approximately 3 months ago.  She is not complaining of pain.  She states that she has some feelings in the ankle when she is at dialysis.  No issues otherwise.  She has remained nonweightbearing.  No fevers or chills.  She has been treating it with Santyl .  She has been taking Bactrim , and has not picked up an updated prescription for ciprofloxacin .   Review of Systems: No fevers or chills No numbness or tingling No Chest Pain No shortness of breath   Objective: There were no vitals taken for this visit.  Physical Exam:  Alert and oriented.  No acute distress.  Lateral  wound dehiscence with increased amount of red granulation tissue.  There does appear to be some drainage within the wound bed.  Medial ankle has healed.  She is tolerating gentle range of motion.     IMAGING: I personally ordered and reviewed the following images  No new imaging obtained today   Tonita Frater, MD 04/14/2024 4:26 PM

## 2024-04-15 ENCOUNTER — Other Ambulatory Visit: Payer: Self-pay | Admitting: Orthopedic Surgery

## 2024-04-20 ENCOUNTER — Other Ambulatory Visit (HOSPITAL_COMMUNITY): Payer: Self-pay

## 2024-04-29 ENCOUNTER — Ambulatory Visit: Admitting: Orthopedic Surgery

## 2024-05-04 ENCOUNTER — Encounter: Attending: Physician Assistant | Admitting: Physician Assistant

## 2024-05-04 DIAGNOSIS — L97312 Non-pressure chronic ulcer of right ankle with fat layer exposed: Secondary | ICD-10-CM | POA: Insufficient documentation

## 2024-05-04 DIAGNOSIS — Z992 Dependence on renal dialysis: Secondary | ICD-10-CM | POA: Insufficient documentation

## 2024-05-04 DIAGNOSIS — I890 Lymphedema, not elsewhere classified: Secondary | ICD-10-CM | POA: Insufficient documentation

## 2024-05-04 DIAGNOSIS — E11621 Type 2 diabetes mellitus with foot ulcer: Secondary | ICD-10-CM | POA: Diagnosis present

## 2024-05-04 DIAGNOSIS — I5042 Chronic combined systolic (congestive) and diastolic (congestive) heart failure: Secondary | ICD-10-CM | POA: Insufficient documentation

## 2024-05-04 DIAGNOSIS — N186 End stage renal disease: Secondary | ICD-10-CM | POA: Insufficient documentation

## 2024-05-04 DIAGNOSIS — E1122 Type 2 diabetes mellitus with diabetic chronic kidney disease: Secondary | ICD-10-CM | POA: Insufficient documentation

## 2024-05-04 DIAGNOSIS — Z89512 Acquired absence of left leg below knee: Secondary | ICD-10-CM | POA: Diagnosis not present

## 2024-05-04 DIAGNOSIS — I132 Hypertensive heart and chronic kidney disease with heart failure and with stage 5 chronic kidney disease, or end stage renal disease: Secondary | ICD-10-CM | POA: Diagnosis not present

## 2024-05-04 DIAGNOSIS — T8131XA Disruption of external operation (surgical) wound, not elsewhere classified, initial encounter: Secondary | ICD-10-CM | POA: Insufficient documentation

## 2024-05-04 DIAGNOSIS — E1143 Type 2 diabetes mellitus with diabetic autonomic (poly)neuropathy: Secondary | ICD-10-CM | POA: Insufficient documentation

## 2024-05-05 ENCOUNTER — Ambulatory Visit: Admitting: Orthopedic Surgery

## 2024-05-06 ENCOUNTER — Encounter

## 2024-05-06 DIAGNOSIS — T8131XA Disruption of external operation (surgical) wound, not elsewhere classified, initial encounter: Secondary | ICD-10-CM | POA: Diagnosis not present

## 2024-05-11 ENCOUNTER — Encounter: Admitting: Physician Assistant

## 2024-05-11 DIAGNOSIS — K853 Drug induced acute pancreatitis without necrosis or infection: Secondary | ICD-10-CM | POA: Diagnosis not present

## 2024-05-11 DIAGNOSIS — K859 Acute pancreatitis without necrosis or infection, unspecified: Secondary | ICD-10-CM | POA: Diagnosis not present

## 2024-05-13 ENCOUNTER — Encounter

## 2024-05-14 ENCOUNTER — Other Ambulatory Visit: Payer: Self-pay

## 2024-05-14 ENCOUNTER — Emergency Department

## 2024-05-14 ENCOUNTER — Inpatient Hospital Stay
Admission: EM | Admit: 2024-05-14 | Discharge: 2024-05-16 | DRG: 438 | Disposition: A | Discharge status: Home / Self Care | Source: Other Acute Inpatient Hospital | Attending: Student | Admitting: Student

## 2024-05-14 DIAGNOSIS — Z89512 Acquired absence of left leg below knee: Secondary | ICD-10-CM | POA: Diagnosis not present

## 2024-05-14 DIAGNOSIS — E66812 Obesity, class 2: Secondary | ICD-10-CM | POA: Diagnosis present

## 2024-05-14 DIAGNOSIS — N281 Cyst of kidney, acquired: Secondary | ICD-10-CM | POA: Diagnosis present

## 2024-05-14 DIAGNOSIS — Z83438 Family history of other disorder of lipoprotein metabolism and other lipidemia: Secondary | ICD-10-CM

## 2024-05-14 DIAGNOSIS — B372 Candidiasis of skin and nail: Secondary | ICD-10-CM | POA: Diagnosis present

## 2024-05-14 DIAGNOSIS — I509 Heart failure, unspecified: Secondary | ICD-10-CM | POA: Diagnosis present

## 2024-05-14 DIAGNOSIS — K859 Acute pancreatitis without necrosis or infection, unspecified: Secondary | ICD-10-CM | POA: Diagnosis present

## 2024-05-14 DIAGNOSIS — K219 Gastro-esophageal reflux disease without esophagitis: Secondary | ICD-10-CM | POA: Diagnosis present

## 2024-05-14 DIAGNOSIS — R262 Difficulty in walking, not elsewhere classified: Secondary | ICD-10-CM | POA: Diagnosis present

## 2024-05-14 DIAGNOSIS — I132 Hypertensive heart and chronic kidney disease with heart failure and with stage 5 chronic kidney disease, or end stage renal disease: Secondary | ICD-10-CM | POA: Diagnosis present

## 2024-05-14 DIAGNOSIS — N186 End stage renal disease: Secondary | ICD-10-CM

## 2024-05-14 DIAGNOSIS — G894 Chronic pain syndrome: Secondary | ICD-10-CM | POA: Diagnosis present

## 2024-05-14 DIAGNOSIS — Z803 Family history of malignant neoplasm of breast: Secondary | ICD-10-CM

## 2024-05-14 DIAGNOSIS — R600 Localized edema: Secondary | ICD-10-CM | POA: Diagnosis present

## 2024-05-14 DIAGNOSIS — I272 Pulmonary hypertension, unspecified: Secondary | ICD-10-CM | POA: Diagnosis present

## 2024-05-14 DIAGNOSIS — Z90710 Acquired absence of both cervix and uterus: Secondary | ICD-10-CM

## 2024-05-14 DIAGNOSIS — E1122 Type 2 diabetes mellitus with diabetic chronic kidney disease: Secondary | ICD-10-CM | POA: Diagnosis present

## 2024-05-14 DIAGNOSIS — N2581 Secondary hyperparathyroidism of renal origin: Secondary | ICD-10-CM | POA: Diagnosis present

## 2024-05-14 DIAGNOSIS — Z6841 Body Mass Index (BMI) 40.0 and over, adult: Secondary | ICD-10-CM | POA: Diagnosis not present

## 2024-05-14 DIAGNOSIS — E785 Hyperlipidemia, unspecified: Secondary | ICD-10-CM | POA: Diagnosis present

## 2024-05-14 DIAGNOSIS — Z885 Allergy status to narcotic agent status: Secondary | ICD-10-CM

## 2024-05-14 DIAGNOSIS — Z79899 Other long term (current) drug therapy: Secondary | ICD-10-CM

## 2024-05-14 DIAGNOSIS — D631 Anemia in chronic kidney disease: Secondary | ICD-10-CM | POA: Diagnosis present

## 2024-05-14 DIAGNOSIS — Z888 Allergy status to other drugs, medicaments and biological substances status: Secondary | ICD-10-CM

## 2024-05-14 DIAGNOSIS — F419 Anxiety disorder, unspecified: Secondary | ICD-10-CM | POA: Diagnosis present

## 2024-05-14 DIAGNOSIS — Z833 Family history of diabetes mellitus: Secondary | ICD-10-CM

## 2024-05-14 DIAGNOSIS — Z91041 Radiographic dye allergy status: Secondary | ICD-10-CM

## 2024-05-14 DIAGNOSIS — Z7985 Long-term (current) use of injectable non-insulin antidiabetic drugs: Secondary | ICD-10-CM

## 2024-05-14 DIAGNOSIS — Z886 Allergy status to analgesic agent status: Secondary | ICD-10-CM

## 2024-05-14 DIAGNOSIS — E1151 Type 2 diabetes mellitus with diabetic peripheral angiopathy without gangrene: Secondary | ICD-10-CM | POA: Diagnosis present

## 2024-05-14 DIAGNOSIS — T50995A Adverse effect of other drugs, medicaments and biological substances, initial encounter: Secondary | ICD-10-CM | POA: Diagnosis present

## 2024-05-14 DIAGNOSIS — H5462 Unqualified visual loss, left eye, normal vision right eye: Secondary | ICD-10-CM | POA: Diagnosis present

## 2024-05-14 DIAGNOSIS — Z992 Dependence on renal dialysis: Secondary | ICD-10-CM

## 2024-05-14 DIAGNOSIS — K853 Drug induced acute pancreatitis without necrosis or infection: Principal | ICD-10-CM | POA: Diagnosis present

## 2024-05-14 DIAGNOSIS — Z880 Allergy status to penicillin: Secondary | ICD-10-CM

## 2024-05-14 DIAGNOSIS — I10 Essential (primary) hypertension: Secondary | ICD-10-CM | POA: Diagnosis present

## 2024-05-14 DIAGNOSIS — E11610 Type 2 diabetes mellitus with diabetic neuropathic arthropathy: Secondary | ICD-10-CM | POA: Diagnosis present

## 2024-05-14 DIAGNOSIS — Z8249 Family history of ischemic heart disease and other diseases of the circulatory system: Secondary | ICD-10-CM

## 2024-05-14 DIAGNOSIS — Z7982 Long term (current) use of aspirin: Secondary | ICD-10-CM

## 2024-05-14 DIAGNOSIS — Z794 Long term (current) use of insulin: Secondary | ICD-10-CM

## 2024-05-14 DIAGNOSIS — I739 Peripheral vascular disease, unspecified: Secondary | ICD-10-CM | POA: Diagnosis present

## 2024-05-14 LAB — CBC
HCT: 36.1 % (ref 36.0–46.0)
Hemoglobin: 11 g/dL — ABNORMAL LOW (ref 12.0–15.0)
MCH: 28.1 pg (ref 26.0–34.0)
MCHC: 30.5 g/dL (ref 30.0–36.0)
MCV: 92.3 fL (ref 80.0–100.0)
Platelets: 185 K/uL (ref 150–400)
RBC: 3.91 MIL/uL (ref 3.87–5.11)
RDW: 14.4 % (ref 11.5–15.5)
WBC: 6.5 K/uL (ref 4.0–10.5)
nRBC: 0 % (ref 0.0–0.2)

## 2024-05-14 LAB — COMPREHENSIVE METABOLIC PANEL WITH GFR
ALT: 10 U/L (ref 0–44)
AST: 18 U/L (ref 15–41)
Albumin: 2.9 g/dL — ABNORMAL LOW (ref 3.5–5.0)
Alkaline Phosphatase: 83 U/L (ref 38–126)
Anion gap: 11 (ref 5–15)
BUN: 19 mg/dL (ref 8–23)
CO2: 25 mmol/L (ref 22–32)
Calcium: 8.4 mg/dL — ABNORMAL LOW (ref 8.9–10.3)
Chloride: 99 mmol/L (ref 98–111)
Creatinine, Ser: 3.73 mg/dL — ABNORMAL HIGH (ref 0.44–1.00)
GFR, Estimated: 13 mL/min — ABNORMAL LOW (ref >60)
Glucose, Bld: 165 mg/dL — ABNORMAL HIGH (ref 70–99)
Potassium: 3.9 mmol/L (ref 3.5–5.1)
Sodium: 135 mmol/L (ref 135–145)
Total Bilirubin: 0.7 mg/dL (ref 0.0–1.2)
Total Protein: 6.7 g/dL (ref 6.5–8.1)

## 2024-05-14 LAB — LIPASE, BLOOD: Lipase: 295 U/L — ABNORMAL HIGH (ref 11–51)

## 2024-05-14 LAB — TRIGLYCERIDES: Triglycerides: 59 mg/dL (ref <150)

## 2024-05-14 LAB — TROPONIN I (HIGH SENSITIVITY): Troponin I (High Sensitivity): 10 ng/L (ref <18)

## 2024-05-14 MED ORDER — INSULIN GLARGINE-YFGN 100 UNIT/ML ~~LOC~~ SOLN
14.0000 [IU] | Freq: Every day | SUBCUTANEOUS | Status: DC
  Administered 2024-05-14 – 2024-05-15 (×2): 14 [IU] via SUBCUTANEOUS
  Filled 2024-05-14 (×4): qty 0.14

## 2024-05-14 MED ORDER — DOXAZOSIN MESYLATE 4 MG PO TABS
8.0000 mg | ORAL_TABLET | Freq: Every day | ORAL | Status: DC
  Administered 2024-05-14 – 2024-05-16 (×3): 8 mg via ORAL
  Filled 2024-05-14 (×3): qty 2

## 2024-05-14 MED ORDER — LACTATED RINGERS IV SOLN: INTRAVENOUS | Status: AC

## 2024-05-14 MED ORDER — PROMETHAZINE HCL 25 MG PO TABS
25.0000 mg | ORAL_TABLET | Freq: Once | ORAL | Status: AC
  Administered 2024-05-14: 25 mg via ORAL
  Filled 2024-05-14: qty 1

## 2024-05-14 MED ORDER — PROCHLORPERAZINE EDISYLATE 10 MG/2ML IJ SOLN
10.0000 mg | Freq: Once | INTRAMUSCULAR | Status: DC
Start: 2024-05-14 — End: 2024-05-14
  Filled 2024-05-14: qty 2

## 2024-05-14 MED ORDER — DIPHENHYDRAMINE HCL 50 MG/ML IJ SOLN
12.5000 mg | Freq: Once | INTRAMUSCULAR | Status: DC
  Filled 2024-05-14: qty 1

## 2024-05-14 MED ORDER — OXYCODONE HCL 5 MG PO TABS
5.0000 mg | ORAL_TABLET | Freq: Once | ORAL | Status: DC
  Filled 2024-05-14: qty 1

## 2024-05-14 MED ORDER — HYDROMORPHONE HCL 1 MG/ML IJ SOLN
0.5000 mg | INTRAMUSCULAR | Status: AC | PRN
  Administered 2024-05-14: 0.5 mg via INTRAVENOUS
  Filled 2024-05-14: qty 0.5

## 2024-05-14 MED ORDER — DULOXETINE HCL 30 MG PO CPEP
30.0000 mg | ORAL_CAPSULE | Freq: Every day | ORAL | Status: DC
  Administered 2024-05-14 – 2024-05-15 (×2): 30 mg via ORAL
  Filled 2024-05-14 (×2): qty 1

## 2024-05-14 MED ORDER — FENTANYL CITRATE PF 50 MCG/ML IJ SOSY
25.0000 ug | PREFILLED_SYRINGE | INTRAMUSCULAR | Status: AC | PRN
  Administered 2024-05-14: 25 ug via INTRAVENOUS
  Filled 2024-05-14: qty 1

## 2024-05-14 MED ORDER — ACETAMINOPHEN 325 MG PO TABS: 650.0000 mg | ORAL_TABLET | Freq: Four times a day (QID) | ORAL | Status: DC | PRN

## 2024-05-14 MED ORDER — ONDANSETRON HCL 4 MG PO TABS: 4.0000 mg | ORAL_TABLET | Freq: Four times a day (QID) | ORAL | Status: DC | PRN

## 2024-05-14 MED ORDER — GABAPENTIN 300 MG PO CAPS
600.0000 mg | ORAL_CAPSULE | Freq: Every day | ORAL | Status: DC
  Administered 2024-05-14 – 2024-05-15 (×2): 600 mg via ORAL
  Filled 2024-05-14 (×2): qty 2

## 2024-05-14 MED ORDER — HEPARIN SODIUM (PORCINE) 5000 UNIT/ML IJ SOLN
5000.0000 [IU] | Freq: Three times a day (TID) | INTRAMUSCULAR | Status: DC
  Administered 2024-05-14 – 2024-05-16 (×5): 5000 [IU] via SUBCUTANEOUS
  Filled 2024-05-14 (×5): qty 1

## 2024-05-14 MED ORDER — DOXAZOSIN MESYLATE 8 MG PO TABS
8.0000 mg | ORAL_TABLET | Freq: Every day | ORAL | Status: DC
  Filled 2024-05-14: qty 1

## 2024-05-14 MED ORDER — VITAMIN B-12 1000 MCG PO TABS
1000.0000 ug | ORAL_TABLET | Freq: Every day | ORAL | Status: DC
  Administered 2024-05-15 – 2024-05-16 (×2): 1000 ug via ORAL
  Filled 2024-05-14: qty 1
  Filled 2024-05-14: qty 2

## 2024-05-14 MED ORDER — INSULIN ASPART 100 UNIT/ML IJ SOLN
0.0000 [IU] | Freq: Three times a day (TID) | INTRAMUSCULAR | Status: DC
  Administered 2024-05-15: 1 [IU] via SUBCUTANEOUS
  Administered 2024-05-16: 2 [IU] via SUBCUTANEOUS
  Filled 2024-05-14 (×2): qty 1

## 2024-05-14 MED ORDER — SODIUM CHLORIDE 0.9 % IV SOLN: 12.5000 mg | Freq: Four times a day (QID) | INTRAVENOUS | Status: AC | PRN

## 2024-05-14 MED ORDER — HYDROXYZINE HCL 50 MG PO TABS
25.0000 mg | ORAL_TABLET | Freq: Four times a day (QID) | ORAL | Status: DC | PRN
  Administered 2024-05-14: 25 mg via ORAL
  Filled 2024-05-14: qty 1

## 2024-05-14 MED ORDER — EZETIMIBE 10 MG PO TABS
10.0000 mg | ORAL_TABLET | Freq: Every day | ORAL | Status: DC
  Administered 2024-05-14 – 2024-05-15 (×2): 10 mg via ORAL
  Filled 2024-05-14 (×2): qty 1

## 2024-05-14 MED ORDER — DOCUSATE SODIUM 100 MG PO CAPS
100.0000 mg | ORAL_CAPSULE | Freq: Two times a day (BID) | ORAL | Status: DC
  Administered 2024-05-14 – 2024-05-16 (×3): 100 mg via ORAL
  Filled 2024-05-14 (×4): qty 1

## 2024-05-14 MED ORDER — CALCITRIOL 0.25 MCG PO CAPS: 0.2500 ug | ORAL_CAPSULE | ORAL | Status: DC

## 2024-05-14 MED ORDER — ALBUTEROL SULFATE (2.5 MG/3ML) 0.083% IN NEBU: 2.5000 mg | INHALATION_SOLUTION | Freq: Four times a day (QID) | RESPIRATORY_TRACT | Status: DC | PRN

## 2024-05-14 MED ORDER — IRBESARTAN 150 MG PO TABS
300.0000 mg | ORAL_TABLET | Freq: Every day | ORAL | Status: DC
  Administered 2024-05-15 – 2024-05-16 (×2): 300 mg via ORAL
  Filled 2024-05-14 (×2): qty 2

## 2024-05-14 MED ORDER — ASPIRIN 81 MG PO TBEC
81.0000 mg | DELAYED_RELEASE_TABLET | Freq: Every day | ORAL | Status: DC
  Administered 2024-05-14 – 2024-05-16 (×3): 81 mg via ORAL
  Filled 2024-05-14 (×3): qty 1

## 2024-05-14 MED ORDER — NYSTATIN 100000 UNIT/GM EX POWD
Freq: Three times a day (TID) | CUTANEOUS | Status: DC
  Filled 2024-05-14: qty 15

## 2024-05-14 MED ORDER — CLONIDINE HCL 0.1 MG PO TABS
0.3000 mg | ORAL_TABLET | Freq: Two times a day (BID) | ORAL | Status: DC
  Administered 2024-05-14 – 2024-05-16 (×4): 0.3 mg via ORAL
  Filled 2024-05-14 (×4): qty 3

## 2024-05-14 MED ORDER — ISOSORBIDE MONONITRATE ER 30 MG PO TB24
120.0000 mg | ORAL_TABLET | Freq: Every day | ORAL | Status: DC
  Administered 2024-05-14 – 2024-05-16 (×3): 120 mg via ORAL
  Filled 2024-05-14 (×2): qty 2
  Filled 2024-05-14: qty 4
  Filled 2024-05-14: qty 2

## 2024-05-14 MED ORDER — ONDANSETRON HCL 4 MG/2ML IJ SOLN
4.0000 mg | Freq: Four times a day (QID) | INTRAMUSCULAR | Status: DC | PRN
  Administered 2024-05-14: 4 mg via INTRAVENOUS
  Filled 2024-05-14: qty 2

## 2024-05-14 MED ORDER — KETOROLAC TROMETHAMINE 30 MG/ML IJ SOLN
15.0000 mg | Freq: Once | INTRAMUSCULAR | Status: DC
Start: 2024-05-14 — End: 2024-05-14
  Filled 2024-05-14: qty 1

## 2024-05-14 MED ORDER — INSULIN GLARGINE-YFGN 100 UNIT/ML ~~LOC~~ SOPN
14.0000 [IU] | PEN_INJECTOR | Freq: Every day | SUBCUTANEOUS | Status: DC
  Filled 2024-05-14: qty 3

## 2024-05-14 MED ORDER — ACETAMINOPHEN 650 MG RE SUPP: 650.0000 mg | Freq: Four times a day (QID) | RECTAL | Status: DC | PRN

## 2024-05-14 MED ORDER — PANTOPRAZOLE SODIUM 40 MG PO TBEC
40.0000 mg | DELAYED_RELEASE_TABLET | Freq: Two times a day (BID) | ORAL | Status: DC
  Administered 2024-05-14 – 2024-05-16 (×4): 40 mg via ORAL
  Filled 2024-05-14 (×4): qty 1

## 2024-05-14 MED ORDER — INSULIN ASPART 100 UNIT/ML IJ SOLN: 0.0000 [IU] | Freq: Every day | INTRAMUSCULAR | Status: DC

## 2024-05-14 NOTE — H&P (Signed)
 History and Physical   Rachel Holmes FMW:[id number] DOB: 1961/07/31 DOA: 05/14/2024  PCP: Zachary Idelia LABOR, MD  Outpatient Specialists: Dr. Jacobo, medical oncology Patient coming from: Dialysis center  I have personally briefly reviewed patient's old medical records in [facility] EMR.  Chief Concern: Abdominal pain, nausea, vomiting  HPI: [name] is a 63 year old female with history of morbid obesity, hypertension, end-stage renal disease on hemodialysis MWF, who presents emergency department for chief concerns of abdominal pain, nausea, vomiting, inability to take anything down.  Vitals in the ED showed T of 98, rr 18, hr 66, blood pressure 155/66, SpO2 100% on room air.  Serum sodium is 135, potassium 3.9, chloride 99, bicarb 25, BUN of 19, serum creatinine of 3.73, eGFR 13, nonfasting blood glucose 165, WBC 6.5, hemoglobin 11, platelets 183.  HS troponin was 10.  Lipase was elevated at 295.  ED treatment: Phenergan  25 mg p.o. one-time dose. ----------------------------------------- At bedside, patient patient was able to tell me her first and last name, age, location, current calendar year.  She reports her stomach started about 2 days ago.  She denies trauma to her person.  She reports it is in the left upper quadrant to right upper quadrant area.  She reports she has never had this pain before.  She reports the pain is worse with eating.  She denies nausea, vomiting, chest pain, shortness of breath, dysuria, hematuria, diarrhea, blood in her stool.  She reports she still makes urine every day and has not had any changes.  She denies syncope, loss of consciousness.  Social history: She lives at home on her own.  She denies tobacco, EtOH, recreational drug use.  ROS: Constitutional: no weight change, no fever ENT/Mouth: no sore throat, no rhinorrhea Eyes: no eye pain, no vision changes Cardiovascular: no chest pain, no dyspnea,  no edema, no  palpitations Respiratory: no cough, no sputum, no wheezing Gastrointestinal: no nausea, no vomiting, no diarrhea, no constipation Genitourinary: no urinary incontinence, no dysuria, no hematuria Musculoskeletal: no arthralgias, no myalgias Skin: no skin lesions, no pruritus, Neuro: + weakness, no loss of consciousness, no syncope Psych: no anxiety, no depression, + decrease appetite Heme/Lymph: no bruising, no bleeding  ED Course: Discussed with EDP, patient requiring hospitalization for chief concerns of pancreatitis.  Assessment/Plan  Principal Problem:   Acute pancreatitis Active Problems:   Morbid obesity with BMI of 45.0-49.9, adult (HCC)   ESRD on hemodialysis (HCC)   Type 2 diabetes mellitus with diabetic peripheral angiopathy without gangrene, with long-term current use of insulin  (HCC)   Hypertension   Hx of BKA, left (HCC)   PVD (peripheral vascular disease) (HCC)   Chronic pain syndrome   Gastro-esophageal reflux disease without esophagitis   HTN (hypertension), malignant   Candidiasis, intertrigo   Assessment and Plan:  * Acute pancreatitis Continue with LR infusion at 100 mL/h, 1 day ordered Strict I's and O's Symptomatic support: Dilaudid  0.5 mg IV every 4 hours as needed for moderate pain, 1 day ordered; fentanyl  25 mcg IV every 4 hours as needed for severe pain, 1 day ordered  Morbid obesity with BMI of 45.0-49.9, adult (HCC) This complicates overall care and prognosis.   Type 2 diabetes mellitus with diabetic peripheral angiopathy without gangrene, with long-term current use of insulin  (HCC) Insulin  SSI with hs coverage ordered Long-acting insulin  equivalent, 14 units subcutaneous nightly, resumed on admission  ESRD on hemodialysis [facility]) Nephrology has been consulted  Chronic pain syndrome PDMP reviewed, currently gabapentin  is  active prescription  Hypertension Home irbesartan : Resumed, isosorbide  mononitrate 120 milligrams daily resumed Home  clonidine  0.3 mg p.o. twice daily resumed  Candidiasis, intertrigo Nystatin  powder TID ordered on admission  Gastro-esophageal reflux disease without esophagitis Home PPI equivalent resumed  Chart reviewed.   DVT prophylaxis: Heparin  5000 units subcutaneous every 8 hours Code Status: Full code Diet: N.p.o. except for sips with meds and ice chips Family Communication: No Disposition Plan: Pending clinical course Consults called: Nephrology Admission status: Telemetry medical, inpatient  Past Medical History:  Diagnosis Date   Allergic rhinitis    Allergy    Anemia    Anxiety    Aortic stenosis 09/13/2016   a.) TTE 09/13/2016: mild AS --> MPG 11.2 mmHg. b.) TTE 12/27/2020: EF 55-60%; no AS --> MPG 8 mmHg.   Aortic valve endocarditis 12/25/2018   a.) TTE 12/25/2018: 0.75 x 1 cm AV mass. b.) TEE 12/28/2018: small shaggy mobile density on aortic side of RIGHT coronary cusp consistent with vegetation.   Arthritis    Back pain, chronic    Bimalleolar fracture of right ankle 01/2024   Bradycardia    Breast mass    Patient can no longer palpate specific masses but showed tech general area of concern   Charcot's joint of ankle, left    CHF (congestive heart failure) (HCC) 07/09/2007   a.) TTE 07/09/2007: EF 50%; G1DD. b.) TTE 09/13/2016: EF 55%; G2DD. c.) TTE 12/27/2020: EF 55-60%; G2DD; GLS -15.1%.; d.) TTE 09/04/2023: EF 50-55%, G1DD, mild LAE, mild MR, mod AoV calc   Complication of anesthesia    a.) PONV. b.) Delayed emergence   Constipation    Diabetic nephropathy (HCC)    DVT (deep venous thrombosis) (HCC)    Dyspnea    ESRD (end stage renal disease) on dialysis [facility])    Family history of adverse reaction to anesthesia    a.) Sisters x 2 with (+) delayed emergence.   GERD (gastroesophageal reflux disease)    Heart murmur    HLD (hyperlipidemia)    Hyperparathyroidism (HCC)    Hypertension    Legally blind in left eye, as defined in [place]     Lymphedema    Mild pulmonary  hypertension (HCC) 09/13/2016   a.) TTE 09/13/2016: EF 55%; RVSP 45 mmHg. b.) TTE 12/25/2018: EF 55-60%; RVSP 35.5 mmHg.   Obesity    Onychomycosis    PONV (postoperative nausea and vomiting)    S/P BKA (below knee amputation) unilateral, left (HCC)    Sepsis (HCC) 12/2018   a.) group G streptococcal bacteremia with AV endocarditis and LLE soft tissue infection.   T2DM (type 2 diabetes mellitus) [facility])    Past Surgical History:  Procedure Laterality Date   A/V FISTULAGRAM Left 06/18/2022   Procedure: A/V Fistulagram;  Surgeon: Jama Cordella MATSU, MD;  Location: [facility] INVASIVE CV LAB;  Service: Cardiovascular;  Laterality: Left;   A/V FISTULAGRAM Left 08/13/2022   Procedure: A/V Fistulagram;  Surgeon: Jama Cordella MATSU, MD;  Location: [facility] INVASIVE CV LAB;  Service: Cardiovascular;  Laterality: Left;   ABDOMINAL HYSTERECTOMY     AMPUTATION Left 05/05/2019   Procedure: AMPUTATION BELOW KNEE;  Surgeon: Jama Cordella MATSU, MD;  Location: [facility] ORS;  Service: Vascular;  Laterality: Left;   AV FISTULA PLACEMENT Right 09/19/2021   Procedure: ARTERIOVENOUS (AV) FISTULA CREATION (BRACHIAL CEPHALIC);  Surgeon: Jama Cordella MATSU, MD;  Location: [facility] ORS;  Service: Vascular;  Laterality: Right;   AV FISTULA PLACEMENT Left 11/09/2021   Procedure: ARTERIOVENOUS (AV) FISTULA CREATION (  BRACHIAL CEPHALIC );  Surgeon: Jama Cordella MATSU, MD;  Location: [facility] ORS;  Service: Vascular;  Laterality: Left;   BREAST BIOPSY Left 2014   FNA 12:00 position - Negative   CATARACT EXTRACTION Bilateral    DIALYSIS/PERMA CATHETER INSERTION N/A 03/28/2022   Procedure: DIALYSIS/PERMA CATHETER INSERTION;  Surgeon: Marea Selinda RAMAN, MD;  Location: [facility] INVASIVE CV LAB;  Service: Cardiovascular;  Laterality: N/A;   DIALYSIS/PERMA CATHETER REMOVAL N/A 12/09/2022   Procedure: DIALYSIS/PERMA CATHETER REMOVAL;  Surgeon: Marea Selinda RAMAN, MD;  Location: [facility] INVASIVE CV LAB;  Service: Cardiovascular;  Laterality: N/A;   EYE SURGERY Left  2007   removed a lens, no lens implanted   IR FLUORO GUIDE CV LINE RIGHT  12/29/2018   IR REMOVAL TUN CV CATH W/O FL  02/19/2019   IR US  GUIDE VASC ACCESS RIGHT  12/29/2018   ORIF ANKLE FRACTURE Right 01/22/2024   Procedure: OPEN REDUCTION INTERNAL FIXATION (ORIF) ANKLE FRACTURE;  Surgeon: Onesimo Oneil LABOR, MD;  Location: [facility] ORS;  Service: Orthopedics;  Laterality: Right;   REVISON OF ARTERIOVENOUS FISTULA Left 10/16/2022   Procedure: REVISON OF ARTERIOVENOUS FISTULA;  Surgeon: Jama Cordella MATSU, MD;  Location: [facility] ORS;  Service: Vascular;  Laterality: Left;   TEE WITHOUT CARDIOVERSION N/A 12/28/2018   Procedure: TRANSESOPHAGEAL ECHOCARDIOGRAM (TEE);  Surgeon: Shlomo Wilbert SAUNDERS, MD;  Location: [facility] ENDOSCOPY;  Service: Cardiovascular;  Laterality: N/A;   Social History:  reports that she has never smoked. She has never used smokeless tobacco. She reports that she does not drink alcohol and does not use drugs.  Allergies  Allergen Reactions   Nifedipine Rash   Penicillins Hives, Shortness Of Breath and Swelling   Statins Shortness Of Breath    Wheezing   Hydralazine  Itching   Hydrocodone  Itching and Nausea Only   Iodinated Contrast Media Other (See Comments)    ESRD   Metoprolol Diarrhea and Other (See Comments)    Pt states make my heart race   Oxycodone  Nausea Only   Codeine Nausea Only   Ibuprofen  Other (See Comments)    Raises blood pressure   Family History  Problem Relation Age of Onset   Breast cancer Sister 60   Diabetes Sister    Diabetes Mother    Hypertension Mother    Hyperlipidemia Mother    Eating disorder Mother    Obesity Mother    Family history: Family history reviewed and not pertinent.  Prior to Admission medications   Medication Sig Start Date End Date Taking? Authorizing Provider  acetaminophen  (TYLENOL ) 500 MG tablet Take 2 tablets (1,000 mg total) by mouth every 6 (six) hours as needed. 01/22/24 01/21/25  Onesimo Oneil LABOR, MD  albuterol  (PROVENTIL   HFA;VENTOLIN  HFA) 108 (90 Base) MCG/ACT inhaler Inhale 1-2 puffs into the lungs every 6 (six) hours as needed for wheezing or shortness of breath.    [provider]  aspirin  EC 81 MG tablet Take 81 mg by mouth daily. Swallow whole.    [provider]  calcitRIOL  (ROCALTROL ) 0.25 MCG capsule Take 0.25 mcg by mouth every Monday, Wednesday, and Friday with hemodialysis. 10/23/21   [provider]  cetirizine (ZYRTEC) 10 MG tablet Take 10 mg by mouth at bedtime.    [provider]  clobetasol cream (TEMOVATE) 0.05 % Apply 1 application  topically 2 (two) times daily as needed (rash).    [provider]  cloNIDine  (CATAPRES ) 0.3 MG tablet Take 1 tablet (0.3 mg total) by mouth 2 (two) times daily. 06/18/21  Gollan, Timothy J, MD  collagenase  (SANTYL ) 250 UNIT/GM ointment Apply topically daily. Wound to lateral ankle is 8 cm by 4 cm 04/16/24   Onesimo Oneil LABOR, MD  Continuous Blood Gluc Receiver (FREESTYLE LIBRE 14 DAY READER) DEVI USE AS DIRECTED 07/13/20   Kassie Mallick, MD  docusate sodium  (COLACE) 100 MG capsule Take 1 capsule (100 mg total) by mouth 2 (two) times daily. 05/10/19   Stegmayer, Suzen A, PA-C  doxazosin  (CARDURA ) 8 MG tablet Take 8 mg by mouth daily.    [provider]  DULoxetine  (CYMBALTA ) 30 MG capsule Take 30 mg by mouth at bedtime. 08/28/18   [provider]  ezetimibe  (ZETIA ) 10 MG tablet Take 1 tablet (10 mg total) by mouth daily. 06/18/21   Gollan, Timothy J, MD  gabapentin  (NEURONTIN ) 600 MG tablet Take 600 mg by mouth at bedtime.    [provider]  glucosamine-chondroitin 500-400 MG tablet Take 1 tablet by mouth in the morning and at bedtime.    [provider]  hydrOXYzine  (ATARAX ) 25 MG tablet Take 25 mg by mouth every 6 (six) hours as needed for itching.    [provider]  isosorbide  mononitrate (IMDUR ) 120 MG 24 hr tablet Take 1 tablet (120 mg total) by mouth in the morning and at bedtime.  01/27/24   Patel, Sona, MD  lactulose  (CHRONULAC ) 10 GM/15ML solution Take 15 mLs by mouth daily as needed for moderate constipation. 06/14/22   [provider]  Lancets (ONETOUCH ULTRASOFT) lancets Used to check blood sugars four times daily. 03/26/18   Kassie Mallick, MD  LYUMJEV  KWIKPEN 100 UNIT/ML KwikPen Inject 10-20 Units into the skin 3 (three) times daily before meals. With meals 12/01/23   [provider]  Magnesium  400 MG CAPS Take 400 mg by mouth at bedtime.    [provider]  MOUNJARO 10 MG/0.5ML Pen Inject 10 mg into the skin once a week. 02/13/24   [provider]  Multiple Vitamin (MULTIVITAMIN WITH MINERALS) TABS tablet Take 1 tablet by mouth daily.    [provider]  olmesartan (BENICAR) 40 MG tablet Take 40 mg by mouth daily.    [provider]  omeprazole (PRILOSEC) 40 MG capsule Take 40 mg by mouth 2 (two) times daily with breakfast and lunch.    [provider]  [facility] VERIO test strip USE TO CHECK BLOOD SUGAR TWICE DAILY 01/21/22   Kassie Mallick, MD  torsemide  (DEMADEX ) 100 MG tablet Take 1 tablet (100 mg total) by mouth 2 (two) times daily. 08/18/23   Gollan, Timothy J, MD  vitamin B-12 (CYANOCOBALAMIN ) 1000 MCG tablet Take 1,000 mcg by mouth daily.    [provider]   Physical Exam: Vitals:   05/14/24 0825 05/14/24 0826 05/14/24 1225 05/14/24 1700  BP: (!) 155/66  (!) 158/68 (!) 152/74  Pulse: 66  68 72  Resp: 18  18 20   Temp: 98 F (36.7 C)  98.1 F (36.7 C) 98.2 F (36.8 C)  TempSrc: Oral   Oral  SpO2: 100%  100% 100%  Weight:  (!) 140.6 kg    Height:  5' 8 (1.727 m)     Constitutional: appears older than chronological age, chronically ill Eyes: PERRL, lids and conjunctivae normal ENMT: Mucous membranes are moist. Posterior pharynx clear of any exudate or lesions. Age-appropriate dentition. Hearing appropriate Neck: normal, supple, no masses, no thyromegaly Respiratory: clear to  auscultation bilaterally, no wheezing, no crackles. Normal respiratory effort. No accessory muscle use.  Cardiovascular: Regular rate and rhythm, no murmurs / rubs / gallops. No extremity edema. 2+ pedal pulses. No carotid bruits.  Abdomen: Morbidly obese abdomen, no tenderness, no masses palpated, no hepatosplenomegaly. Bowel sounds positive.  Musculoskeletal: no clubbing / cyanosis.  Left BKA. Good ROM, no contractures, no atrophy. Normal muscle tone.  Skin: no rashes, lesions, ulcers. No induration Neurologic: Sensation intact. Strength 5/5 in all 4.  Psychiatric: Normal judgment and insight. Alert and oriented x 3. Normal mood.   EKG: Ordered and pending completion  Chest x-ray on Admission: Not indicated at this time  CT ABDOMEN PELVIS WO CONTRAST Result Date: 05/14/2024 CLINICAL DATA:  Acute abdominal pain, nausea vomiting x2 weeks EXAM: CT ABDOMEN AND PELVIS WITHOUT CONTRAST TECHNIQUE: Multidetector CT imaging of the abdomen and pelvis was performed following the standard protocol without IV contrast. RADIATION DOSE REDUCTION: This exam was performed according to the departmental dose-optimization program which includes automated exposure control, adjustment of the mA and/or kV according to patient size and/or use of iterative reconstruction technique. COMPARISON:  August 22, 2023 CT FINDINGS: Lower chest: Unremarkable. Hepatobiliary: Likely cholelithiasis. No significant pericholecystic inflammation. No focal liver lesions. Pancreas: Unremarkable. Spleen: Unremarkable. Adrenals/Urinary Tract: No adrenal nodules. Mildly hyperdense, well-circumscribed bilateral renal lesions, measuring up to 1.3 cm on the right and 1.3 cm on the left and slightly increased in size since prior study. Punctate nonobstructing renal stones. No hydronephrosis. 2.3 cm Simple right lower pole renal cyst. Stomach/Bowel: No evidence of bowel obstruction or inflammation. Appendix is unremarkable. Vascular/Lymphatic: No  lymphadenopathy by size criteria. Reproductive: Unremarkable. Other: Unchanged superficial soft tissue stranding in the right lower quadrant. Recommend correlation with physical exam. Fat containing periumbilical hernia, similar to prior. Musculoskeletal: No acute osseous findings. IMPRESSION: 1. No acute pathology in the abdomen or pelvis. 2. Cholelithiasis. 3. Mildly hyperdense, well-circumscribed bilateral renal lesions mildly hyperdense, well-circumscribed bilateral renal lesions, slightly increased in size from prior study and favored to represent hemorrhagic or proteinaceous cysts. Nonurgent MRI or CT renal mass protocol with and without IV contrast may be performed for definitive characterization. 4. Stable additional chronic and incidental findings as noted above. Electronically Signed   By: Michaeline Blanch M.D.   On: 05/14/2024 12:01   Labs on Admission: I have personally reviewed following labs  CBC: Recent Labs  Lab 05/14/24 0828  WBC 6.5  HGB 11.0*  HCT 36.1  MCV 92.3  PLT 185   Basic Metabolic Panel: Recent Labs  Lab 05/14/24 0828  NA 135  K 3.9  CL 99  CO2 25  GLUCOSE 165*  BUN 19  CREATININE 3.73*  CALCIUM 8.4*   GFR: Estimated Creatinine Clearance: 23.1 mL/min (A) (by C-G formula based on SCr of 3.73 mg/dL (H)).  Liver Function Tests: Recent Labs  Lab 05/14/24 0828  AST 18  ALT 10  ALKPHOS 83  BILITOT 0.7  PROT 6.7  ALBUMIN  2.9*   Recent Labs  Lab 05/14/24 0828  LIPASE 295*   Urine analysis:    Component Value Date/Time   COLORURINE STRAW (A) 02/16/2023 2129   APPEARANCEUR CLEAR (A) 02/16/2023 2129   LABSPEC 1.006 02/16/2023 2129   PHURINE 5.0 02/16/2023 2129   GLUCOSEU NEGATIVE 02/16/2023 2129   HGBUR NEGATIVE 02/16/2023 2129   BILIRUBINUR NEGATIVE 02/16/2023 2129   KETONESUR NEGATIVE 02/16/2023 2129   PROTEINUR 30 (A) 02/16/2023 2129   NITRITE NEGATIVE 02/16/2023 2129   LEUKOCYTESUR NEGATIVE 02/16/2023 2129   This document was prepared using  Dragon Voice Recognition software and may include  unintentional dictation errors.  Dr. Sherre [facility]  If 7PM-7AM, please contact overnight-coverage provider If 7AM-7PM, please contact day attending provider [email or web address]  05/14/2024, 5:44 PM

## 2024-05-14 NOTE — Assessment & Plan Note (Signed)
 Home irbesartan : Resumed, isosorbide  mononitrate 120 milligrams daily resumed Home clonidine  0.3 mg p.o. twice daily resumed

## 2024-05-14 NOTE — Assessment & Plan Note (Addendum)
 Insulin  SSI with hs coverage ordered Long-acting insulin  equivalent, 14 units subcutaneous nightly, resumed on admission

## 2024-05-14 NOTE — Assessment & Plan Note (Signed)
-   Nephrology has been consulted

## 2024-05-14 NOTE — Assessment & Plan Note (Signed)
 Continue with LR infusion at 100 mL/h, 1 day ordered Strict I's and O's Symptomatic support: Dilaudid  0.5 mg IV every 4 hours as needed for moderate pain, 1 day ordered; fentanyl  25 mcg IV every 4 hours as needed for severe pain, 1 day ordered

## 2024-05-14 NOTE — ED Notes (Signed)
 See triage note   Presents with a 2-3 day hx of abd pain with some  n/v  No fever  but states she is not able to keep anything down

## 2024-05-14 NOTE — Assessment & Plan Note (Signed)
Home PPI equivalent resumed

## 2024-05-14 NOTE — Assessment & Plan Note (Signed)
 -  This complicates overall care and prognosis.

## 2024-05-14 NOTE — Assessment & Plan Note (Signed)
 PDMP reviewed, currently gabapentin  is active prescription

## 2024-05-14 NOTE — ED Triage Notes (Signed)
 Per EMS, Pt, from dialysis, c/o generalized abdominal pain and n/v x2 days and rash on stomach x2-3 weeks.  Pain score 5/10.    Pt reports she has been putting medicated powder on rash w/o relief.    Pt only received 1.5hrs of 4hr dialysis.

## 2024-05-14 NOTE — Hospital Course (Addendum)
 [name] is a 63 year old female with history of morbid obesity, hypertension, end-stage renal disease on hemodialysis MWF, who presents emergency department for chief concerns of abdominal pain, nausea, vomiting, inability to take anything down.  Vitals in the ED showed T of 98, rr 18, hr 66, blood pressure 155/66, SpO2 100% on room air.  Serum sodium is 135, potassium 3.9, chloride 99, bicarb 25, BUN of 19, serum creatinine of 3.73, eGFR 13, nonfasting blood glucose 165, WBC 6.5, hemoglobin 11, platelets 183.  HS troponin was 10.  Lipase was elevated at 295.  ED treatment: Phenergan  25 mg p.o. one-time dose.

## 2024-05-14 NOTE — ED Provider Notes (Signed)
 [facility] Provider Note    Event Date/Time   First MD Initiated Contact with Patient 05/14/24 0845     (approximate)   History   Abdominal Pain and Emesis   HPI  Rachel Holmes is a 63 y.o. female  with history of ESRD on dialysis, DM2, CHF, HTN,  and as listed in EMR presents to the emergency department for treatment and evaluation of abdominal pain that has been intermittent since Wednesday.  Patient states that Wednesday afternoon she ate and then vomited.  She continued to feel nauseated.  Thursday she tried to eat and vomited again.  No fever or diarrhea.  Abdominal pain comes in waves.  Last bowel movement was 3 days ago.  She is also complaining of tender rash under the fold of her stomach and onto her pubic area.  She has been using medicated powder without relief.  No relief of nausea with Zofran .     Physical Exam    Vitals:   05/14/24 0825 05/14/24 1225  BP: (!) 155/66 (!) 158/68  Pulse: 66 68  Resp: 18 18  Temp: 98 F (36.7 C) 98.1 F (36.7 C)  SpO2: 100% 100%    General: Awake, no distress.  CV:  Good peripheral perfusion.  Resp:  Normal effort.  Abd:  No distention.  Bowel sounds present x 4 quadrants.  Soft.  Tenderness elicited with palpation over the right and left upper quadrant.  No rebound tenderness. Other:  Skin is erythematous with white coating under the pannus of the abdomen and onto the mons pubis and bilateral groin   ED Results / Procedures / Treatments   Labs (all labs ordered are listed, but only abnormal results are displayed)  Labs Reviewed  LIPASE, BLOOD - Abnormal; Notable for the following components:      Result Value   Lipase 295 (*)    All other components within normal limits  COMPREHENSIVE METABOLIC PANEL WITH GFR - Abnormal; Notable for the following components:   Glucose, Bld 165 (*)    Creatinine, Ser 3.73 (*)    Calcium 8.4 (*)    Albumin  2.9 (*)    GFR, Estimated 13 (*)    All other  components within normal limits  CBC - Abnormal; Notable for the following components:   Hemoglobin 11.0 (*)    All other components within normal limits  URINALYSIS, ROUTINE W REFLEX MICROSCOPIC  TRIGLYCERIDES  TROPONIN I (HIGH SENSITIVITY)     EKG  Not indicated.   RADIOLOGY  Image and radiology report reviewed and interpreted by me. Radiology report consistent with the same.  CT abdomen and pelvis shows cholelithiasis without cholecystitis. Otherwise no acute concerns.  PROCEDURES:  Critical Care performed: No  Procedures   MEDICATIONS ORDERED IN ED:  Medications  oxyCODONE  (Oxy IR/ROXICODONE ) immediate release tablet 5 mg (5 mg Oral Not Given 05/14/24 1247)  lactated ringers  infusion (has no administration in time range)  acetaminophen  (TYLENOL ) tablet 650 mg (has no administration in time range)    Or  acetaminophen  (TYLENOL ) suppository 650 mg (has no administration in time range)  ondansetron  (ZOFRAN ) tablet 4 mg (has no administration in time range)    Or  ondansetron  (ZOFRAN ) injection 4 mg (has no administration in time range)  heparin  injection 5,000 Units (has no administration in time range)  HYDROmorphone  (DILAUDID ) injection 0.5 mg (has no administration in time range)  fentaNYL  (SUBLIMAZE ) injection 25 mcg (has no administration in time range)  promethazine  (PHENERGAN )  12.5 mg in sodium chloride  0.9 % 50 mL IVPB (has no administration in time range)  nystatin  (MYCOSTATIN /NYSTOP ) topical powder (has no administration in time range)  promethazine  (PHENERGAN ) tablet 25 mg (25 mg Oral Given 05/14/24 1247)     IMPRESSION / MDM / ASSESSMENT AND PLAN / ED COURSE   I have reviewed the triage note and vital signs. Vital signs reassuring.   Differential diagnosis includes, but is not limited to, acute cholecystitis, cholelithiasis, pancreatitis, gastroenteritis, bowel obstruction, bowel ischemia  Patient's presentation is most consistent with acute  complicated illness / injury requiring diagnostic workup.  63 year old female presenting to the emergency department for treatment and evaluation of abdominal pain with nausea that started 2 days ago.  She has had episodic pain with nausea and vomiting after eating for the past 2 days.  She was only able to finish an hour and a half of her dialysis today due to the nausea and pain worsening.  She denies fever.  She denies dysuria but states that she does not urinate very often.  She denies diarrhea and reports that her last bowel movement was 3 days ago.  Other than leaving dialysis early today, she has not missed any dialysis days recently.  She denies shortness of breath or chest pain.  She does not feel that she is fluid overloaded.  Lab studies show CBC within normal white blood cell count.  CMP overall reassuring with a nonfasting glucose of 165, BUN of 19 with a creatinine of 3.73 and a GFR of 13 on ESRD.  Lipase is 295.  Troponin is normal.  Plan will be to get a CT abdomen/pelvis.  Oxycodone  and Phenergan  ordered however the patient refused oxycodone  despite the only documented adverse effect was nausea.  Clinical Course as of 05/14/24 1427  Fri May 14, 2024  1301 CT abdomen and pelvis without contrast performed due to IV contrast listed as allergy. Patient does not recall details of allergic reaction. Computer lists ESRD as reason for allergy, however CT tech not comfortable giving contrast without allergy protocol over 4 hours. Order changed to CT without. Results indicate cholelithiasis without acute cholecystitis. Pancreas appears normal.  Patient reassessed. She continues to have nausea but no vomiting. Abdominal pain continues to come and go.  Case discussed with attending who recommends pain, nausea control and admission for pancreatitis. [CT]  1426 Dr. Sherre has accepted the patient for admission. [CT]    Clinical Course User Index [CT] Cheyenne Schumm B, FNP     FINAL CLINICAL  IMPRESSION(S) / ED DIAGNOSES   Final diagnoses:  Acute pancreatitis without infection or necrosis, unspecified pancreatitis type     Rx / DC Orders   ED Discharge Orders     None        Note:  This document was prepared using Dragon voice recognition software and may include unintentional dictation errors.   Herlinda Kirk NOVAK, FNP 05/14/24 1427    Levander Slate, MD 05/14/24 1538

## 2024-05-14 NOTE — Assessment & Plan Note (Addendum)
 Nystatin  powder TID ordered on admission

## 2024-05-15 DIAGNOSIS — K859 Acute pancreatitis without necrosis or infection, unspecified: Secondary | ICD-10-CM

## 2024-05-15 LAB — PHOSPHORUS: Phosphorus: 4.5 mg/dL (ref 2.5–4.6)

## 2024-05-15 LAB — MAGNESIUM: Magnesium: 2.1 mg/dL (ref 1.7–2.4)

## 2024-05-15 LAB — BASIC METABOLIC PANEL WITH GFR
Anion gap: 11 (ref 5–15)
BUN: 26 mg/dL — ABNORMAL HIGH (ref 8–23)
CO2: 24 mmol/L (ref 22–32)
Calcium: 8.3 mg/dL — ABNORMAL LOW (ref 8.9–10.3)
Chloride: 101 mmol/L (ref 98–111)
Creatinine, Ser: 5.5 mg/dL — ABNORMAL HIGH (ref 0.44–1.00)
GFR, Estimated: 8 mL/min — ABNORMAL LOW (ref >60)
Glucose, Bld: 134 mg/dL — ABNORMAL HIGH (ref 70–99)
Potassium: 4.4 mmol/L (ref 3.5–5.1)
Sodium: 136 mmol/L (ref 135–145)

## 2024-05-15 LAB — CBC
HCT: 37.4 % (ref 36.0–46.0)
Hemoglobin: 11 g/dL — ABNORMAL LOW (ref 12.0–15.0)
MCH: 28.4 pg (ref 26.0–34.0)
MCHC: 29.4 g/dL — ABNORMAL LOW (ref 30.0–36.0)
MCV: 96.4 fL (ref 80.0–100.0)
Platelets: 195 K/uL (ref 150–400)
RBC: 3.88 MIL/uL (ref 3.87–5.11)
RDW: 14.5 % (ref 11.5–15.5)
WBC: 5.7 K/uL (ref 4.0–10.5)
nRBC: 0 % (ref 0.0–0.2)

## 2024-05-15 LAB — CBG MONITORING, ED: Glucose-Capillary: 157 mg/dL — ABNORMAL HIGH (ref 70–99)

## 2024-05-15 LAB — GLUCOSE, CAPILLARY
Glucose-Capillary: 124 mg/dL — ABNORMAL HIGH (ref 70–99)
Glucose-Capillary: 197 mg/dL — ABNORMAL HIGH (ref 70–99)

## 2024-05-15 LAB — LIPASE, BLOOD: Lipase: 96 U/L — ABNORMAL HIGH (ref 11–51)

## 2024-05-15 MED ORDER — BISACODYL 5 MG PO TBEC
10.0000 mg | DELAYED_RELEASE_TABLET | Freq: Every day | ORAL | Status: DC
  Filled 2024-05-15: qty 2

## 2024-05-15 MED ORDER — LACTULOSE 10 GM/15ML PO SOLN
20.0000 g | Freq: Two times a day (BID) | ORAL | Status: DC
  Administered 2024-05-15: 20 g via ORAL
  Filled 2024-05-15 (×2): qty 30

## 2024-05-15 MED ORDER — BISACODYL 10 MG RE SUPP: 10.0000 mg | Freq: Every day | RECTAL | Status: DC | PRN

## 2024-05-15 MED ORDER — POLYETHYLENE GLYCOL 3350 17 G PO PACK
17.0000 g | PACK | Freq: Every day | ORAL | Status: DC
  Administered 2024-05-15: 17 g via ORAL
  Filled 2024-05-15: qty 1

## 2024-05-15 NOTE — Plan of Care (Signed)
  Problem: Education: Goal: Knowledge of General Education information will improve Description: Including pain rating scale, medication(s)/side effects and non-pharmacologic comfort measures Outcome: Progressing   Problem: Activity: Goal: Risk for activity intolerance will decrease Outcome: Progressing   Problem: Nutrition: Goal: Adequate nutrition will be maintained Outcome: Progressing   Problem: Coping: Goal: Level of anxiety will decrease Outcome: Progressing   Problem: Pain Managment: Goal: General experience of comfort will improve and/or be controlled Outcome: Progressing   Problem: Safety: Goal: Ability to remain free from injury will improve Outcome: Progressing   Problem: Education: Goal: Ability to describe self-care measures that may prevent or decrease complications (Diabetes Survival Skills Education) will improve Outcome: Not Applicable Goal: Individualized Educational Video(s) Outcome: Not Applicable

## 2024-05-15 NOTE — Progress Notes (Signed)
 [facility]  ROUNDING NOTE   Subjective:  Rachel Holmes is a 63 y.o. female with past medical conditions including anemia, anxiety, CHF, diabetes, diabetic nephropathy, GERD, hyperlipidemia, legally blind in left eye, and end-stage renal disease on hemodialysis.  Patient presented to the emergency department for abdominal pain found to have acute pancreatitis. Patient is known to our practice and receives outpatient dialysis at [facility] on a MWF schedule.  Update: Patient alert and in bed, denies pain.  Patient endorses abdominal pain only a few hours after eating each time.  Denies shortness of breath.  No other complaints to offer.  Objective:  Vital signs in last 24 hours:  Temp:  [97.8 F (36.6 C)-98.2 F (36.8 C)] 98 F (36.7 C) (07/12 1710) Pulse Rate:  [57-77] 57 (07/12 1710) Resp:  [13-17] 16 (07/12 1710) BP: (115-174)/(48-70) 147/59 (07/12 1710) SpO2:  [90 %-98 %] 94 % (07/12 1710) Weight:  [137.6 kg] 137.6 kg (07/12 1324)  Weight change:  Filed Weights   05/14/24 0826 05/15/24 1324  Weight: (!) 140.6 kg (!) 137.6 kg    Intake/Output: No intake/output data recorded.   Intake/Output this shift:  Total I/O In: 2663.3 [P.O.:240; I.V.:2423.3] Out: -   Physical Exam: General: NAD  Head: Normocephalic, atraumatic. Moist oral mucosal membranes  Eyes: Anicteric  Neck: Supple, trachea midline  Lungs:  Clear to auscultation, normal effort  Heart: Regular rate and rhythm  Abdomen:  Soft, nontender,   Extremities:  No peripheral edema.  Neurologic: Alert and awake  Skin: No lesions  Access: Lft AVF    Basic Metabolic Panel: Recent Labs  Lab 05/14/24 0828 05/15/24 0724 05/15/24 0800  NA 135 136  --   K 3.9 4.4  --   CL 99 101  --   CO2 25 24  --   GLUCOSE 165* 134*  --   BUN 19 26*  --   CREATININE 3.73* 5.50*  --   CALCIUM 8.4* 8.3*  --   MG  --   --  2.1  PHOS  --   --  4.5    Liver Function Tests: Recent Labs  Lab  05/14/24 0828  AST 18  ALT 10  ALKPHOS 83  BILITOT 0.7  PROT 6.7  ALBUMIN  2.9*   Recent Labs  Lab 05/14/24 0828 05/15/24 0724  LIPASE 295* 96*   No results for input(s): AMMONIA in the last 168 hours.  CBC: Recent Labs  Lab 05/14/24 0828 05/15/24 0724  WBC 6.5 5.7  HGB 11.0* 11.0*  HCT 36.1 37.4  MCV 92.3 96.4  PLT 185 195    Cardiac Enzymes: No results for input(s): CKTOTAL, CKMB, CKMBINDEX, TROPONINI in the last 168 hours.  BNP: Invalid input(s): POCBNP  CBG: Recent Labs  Lab 05/15/24 1151 05/15/24 1736  GLUCAP 157* 124*    Microbiology: Results for orders placed or performed during the hospital encounter of 03/30/24  Culture, blood (routine x 2)     Status: None   Collection Time: 03/30/24  9:11 PM   Specimen: BLOOD  Result Value Ref Range Status   Specimen Description BLOOD RIGHT HAND  Final   Special Requests   Final    BOTTLES DRAWN AEROBIC AND ANAEROBIC Blood Culture results may not be optimal due to an inadequate volume of blood received in culture bottles   Culture   Final    NO GROWTH 5 DAYS Performed at [facility], [address] [place], [place] [address]  Report Status 04/04/2024 FINAL  Final  Culture, blood (routine x 2)     Status: None   Collection Time: 03/30/24  9:38 PM   Specimen: BLOOD  Result Value Ref Range Status   Specimen Description BLOOD BLOOD RIGHT HAND  Final   Special Requests   Final    BOTTLES DRAWN AEROBIC AND ANAEROBIC Blood Culture results may not be optimal due to an inadequate volume of blood received in culture bottles   Culture   Final    NO GROWTH 5 DAYS Performed at [facility], [address], [place], [place] [address]    Report Status 04/04/2024 FINAL  Final  Aerobic/Anaerobic Culture w Gram Stain (surgical/deep wound)     Status: None   Collection Time: 04/01/24 10:18 AM   Specimen: Wound  Result Value Ref Range Status   Specimen Description   Final    WOUND  right ankel Performed at [facility], [address] [place], [place] [address]    Special Requests   Final    NONE Performed at [facility], [address] [place], [place] [address]    Gram Stain   Final    RARE WBC PRESENT, PREDOMINANTLY PMN NO ORGANISMS SEEN    Culture   Final    RARE PSEUDOMONAS AERUGINOSA NO ANAEROBES ISOLATED Performed at [facility], [address] [place], [place] [address]    Report Status 04/06/2024 FINAL  Final   Organism ID, Bacteria PSEUDOMONAS AERUGINOSA  Final      Susceptibility   Pseudomonas aeruginosa - MIC*    CEFTAZIDIME <=1 SENSITIVE Sensitive     CIPROFLOXACIN  <=0.25 SENSITIVE Sensitive     GENTAMICIN <=1 SENSITIVE Sensitive     IMIPENEM 2 SENSITIVE Sensitive     PIP/TAZO <=4 SENSITIVE Sensitive ug/mL    CEFEPIME  1 SENSITIVE Sensitive     * RARE PSEUDOMONAS AERUGINOSA    Coagulation Studies: No results for input(s): LABPROT, INR in the last 72 hours.  Urinalysis: No results for input(s): COLORURINE, LABSPEC, PHURINE, GLUCOSEU, HGBUR, BILIRUBINUR, KETONESUR, PROTEINUR, UROBILINOGEN, NITRITE, LEUKOCYTESUR in the last 72 hours.  Invalid input(s): APPERANCEUR    Imaging: CT ABDOMEN PELVIS WO CONTRAST Result Date: 05/14/2024 CLINICAL DATA:  Acute abdominal pain, nausea vomiting x2 weeks EXAM: CT ABDOMEN AND PELVIS WITHOUT CONTRAST TECHNIQUE: Multidetector CT imaging of the abdomen and pelvis was performed following the standard protocol without IV contrast. RADIATION DOSE REDUCTION: This exam was performed according to the departmental dose-optimization program which includes automated exposure control, adjustment of the mA and/or kV according to patient size and/or use of iterative reconstruction technique. COMPARISON:  August 22, 2023 CT FINDINGS: Lower chest: Unremarkable. Hepatobiliary: Likely cholelithiasis. No significant pericholecystic inflammation. No focal liver lesions.  Pancreas: Unremarkable. Spleen: Unremarkable. Adrenals/Urinary Tract: No adrenal nodules. Mildly hyperdense, well-circumscribed bilateral renal lesions, measuring up to 1.3 cm on the right and 1.3 cm on the left and slightly increased in size since prior study. Punctate nonobstructing renal stones. No hydronephrosis. 2.3 cm Simple right lower pole renal cyst. Stomach/Bowel: No evidence of bowel obstruction or inflammation. Appendix is unremarkable. Vascular/Lymphatic: No lymphadenopathy by size criteria. Reproductive: Unremarkable. Other: Unchanged superficial soft tissue stranding in the right lower quadrant. Recommend correlation with physical exam. Fat containing periumbilical hernia, similar to prior. Musculoskeletal: No acute osseous findings. IMPRESSION: 1. No acute pathology in the abdomen or pelvis. 2. Cholelithiasis. 3. Mildly hyperdense, well-circumscribed bilateral renal lesions mildly hyperdense, well-circumscribed bilateral renal lesions, slightly increased in size from  prior study and favored to represent hemorrhagic or proteinaceous cysts. Nonurgent MRI or CT renal mass protocol with and without IV contrast may be performed for definitive characterization. 4. Stable additional chronic and incidental findings as noted above. Electronically Signed   By: Michaeline Blanch M.D.   On: 05/14/2024 12:01     Medications:     aspirin  EC  81 mg Oral Daily   bisacodyl   10 mg Oral QHS   [START ON 05/17/2024] calcitRIOL   0.25 mcg Oral Q M,W,F-HD   cloNIDine   0.3 mg Oral BID   cyanocobalamin   1,000 mcg Oral Daily   docusate sodium   100 mg Oral BID   doxazosin   8 mg Oral Daily   DULoxetine   30 mg Oral QHS   ezetimibe   10 mg Oral QHS   gabapentin   600 mg Oral QHS   heparin   5,000 Units Subcutaneous Q8H   insulin  aspart  0-5 Units Subcutaneous QHS   insulin  aspart  0-6 Units Subcutaneous TID WC   insulin  glargine-yfgn  14 Units Subcutaneous Q2200   irbesartan   300 mg Oral Daily   isosorbide  mononitrate   120 mg Oral Daily   lactulose   20 g Oral BID   nystatin    Topical TID   oxyCODONE   5 mg Oral Once   pantoprazole   40 mg Oral BID   acetaminophen  **OR** acetaminophen , albuterol , bisacodyl , hydrOXYzine , ondansetron  **OR** ondansetron  (ZOFRAN ) IV  Assessment/ Plan:  [name] is a 63 y.o.  female with past medical conditions including anemia, anxiety, CHF, diabetes, diabetic nephropathy, GERD, hyperlipidemia, legally blind in left eye, and end-stage renal disease on hemodialysis.  Patient presented to the emergency department for abdominal pain found to have pancreatitis. Patient is known to our practice and receives outpatient dialysis at [facility] on a MWF schedule.   [facility]/MWF/left aVF  Acute pancreatitis  Patient was given IVF infusion for 1 day strict intake and output and symptomatic support for pain.  2. End stage renal disease on hemodialysis.  Received dialysis yesterday in outpatient clinic. Next treatment scheduled for Monday.   3. Anemia of chronic kidney disease Recent Labs       Lab Results  Component Value Date    HGB 11.0(L) 05/15/2024    [facility] outpatient Hgb at goal   4. Secondary Hyperparathyroidism: with outpatient labs: PTH 320, phosphorus 4.4, calcium 8.1 on 03/12/24.    Recent Labs       Lab Results  Component Value Date    CALCIUM 8.3 (L) 05/15/2024    CAION 1.05 (L) 01/22/2024    PHOS 4.5 05/15/2024      Will continue to monitor bone minerals during this admission.   Prescribed calcitriol  outpatient.    5. Hypertension with chronic kidney disease.  Home regimen includes clonidine , doxazosin , olmasartan, and torsemide .  Prescribed clonidine , doxazosin , Zetia , irbesartan  and isosorbide  mononitrate.  Blood pressure today 147/59  LOS: 1 Marcellous Snarski SHAUNNA Dines 7/12/20255:55 PM

## 2024-05-15 NOTE — Progress Notes (Signed)
 [facility] Progress Note  Patient: Rachel Holmes    FMW:[id number]  DOA: 05/14/2024     Date of Service: the patient was seen and examined on 05/15/2024  Chief Complaint  Patient presents with   Abdominal Pain   Emesis   Brief hospital course: Rachel Holmes is a 63 year old female with history of morbid obesity, hypertension, end-stage renal disease on hemodialysis MWF, who presents emergency department for chief concerns of abdominal pain, nausea, vomiting, inability to take anything down.   Vitals in the ED showed T of 98, rr 18, hr 66, blood pressure 155/66, SpO2 100% on room air.   Serum sodium is 135, potassium 3.9, chloride 99, bicarb 25, BUN of 19, serum creatinine of 3.73, eGFR 13, nonfasting blood glucose 165, WBC 6.5, hemoglobin 11, platelets 183.   HS troponin was 10.  Lipase was elevated at 295.  Assessment and Plan:  # Acute pancreatitis S/p LR infusion at 100 mL/h overnight, decreased to 75 mL/h Strict I's and O's Symptomatic support: Dilaudid  0.5 mg IV every 4 hours as needed for moderate pain, 1 day ordered; fentanyl  25 mcg IV every 4 hours as needed for severe pain, 1 day ordered Patient tolerated clear liquid diet, advance to full liquid today Lipase [phone] trending down      Type 2 diabetes mellitus with diabetic peripheral angiopathy without gangrene, with long-term current use of insulin  (HCC) Insulin  SSI with hs coverage ordered Long-acting insulin  equivalent, 14 units subcutaneous nightly, resumed on admission   ESRD on hemodialysis [facility]) Nephrology has been consulted   Chronic pain syndrome PDMP reviewed, currently gabapentin  is active prescription   Hypertension Home irbesartan : Resumed, isosorbide  mononitrate 120 milligrams daily resumed Home clonidine  0.3 mg p.o. twice daily resumed   Candidiasis, intertrigo Nystatin  powder TID ordered on admission   Gastro-esophageal reflux disease without esophagitis Home PPI equivalent  resumed  # Renal cyst, incidental finding.  well-circumscribed bilateral renal lesions mildly hyperdense, well-circumscribed bilateral renal lesions, slightly increased in size from prior study and favored to represent hemorrhagic or proteinaceous cysts. Nonurgent MRI or CT renal mass protocol with and without IV contrast may be performed for definitive characterization. Patient was advised to follow-up with PCP and nephrology as an outpatient for further management  Obesity class II Body mass index is 47.14 kg/m.  Interventions: Calorie restricted diet and daily exercise advised to lose body weight.  Lifestyle modification discussed.   Diet: FLD DVT Prophylaxis: Subcutaneous Heparin     Advance goals of care discussion: Full code  Family Communication: family was not present at bedside, at the time of interview.  The pt provided permission to discuss medical plan with the family. Opportunity was given to ask question and all questions were answered satisfactorily.   Disposition:  Pt is from home, admitted with pancreatitis, still has abdominal pain, full liquid diet, which precludes a safe discharge. Discharge to home, when stable, most likely tomorrow a.m.  Subjective: No significant events overnight, abdominal pain is getting better, patient tolerated clear liquid diet and would like to advance her diet as well.  Denied any other specific symptoms.  Physical Exam: General: NAD, lying comfortably Appear in no distress, affect appropriate Eyes: PERRLA ENT: Oral Mucosa Clear, moist  Neck: no JVD,  Cardiovascular: S1 and S2 Present, no Murmur,  Respiratory: good respiratory effort, Bilateral Air entry equal and Decreased, no Crackles, no wheezes Abdomen: Bowel Sound present, Soft and no tenderness,  Skin: no rashes Extremities: s/p Left BKA, RLE no Pedal edema,  no calf tenderness Neurologic: without any new focal findings Gait not checked due to patient safety  concerns  Vitals:   05/14/24 1700 05/14/24 2054 05/14/24 2312 05/15/24 0511  BP: (!) 152/74 (!) 174/66 (!) 130/51 (!) 128/48  Pulse: 72  77 (!) 59  Resp: 20 14 17 16   Temp: 98.2 F (36.8 C) 98.2 F (36.8 C) 97.8 F (36.6 C) 98 F (36.7 C)  TempSrc: Oral Oral Oral Oral  SpO2: 100% 95% 93% 90%  Weight:      Height:       No intake or output data in the 24 hours ending 05/15/24 0801 Filed Weights   05/14/24 0826  Weight: (!) 140.6 kg    Data Reviewed: I have personally reviewed and interpreted daily labs, tele strips, imagings as discussed above. I reviewed all nursing notes, pharmacy notes, vitals, pertinent old records I have discussed plan of care as described above with RN and patient/family.  CBC: Recent Labs  Lab 05/14/24 0828 05/15/24 0724  WBC 6.5 5.7  HGB 11.0* 11.0*  HCT 36.1 37.4  MCV 92.3 96.4  PLT 185 195   Basic Metabolic Panel: Recent Labs  Lab 05/14/24 0828 05/15/24 0724  NA 135 136  K 3.9 4.4  CL 99 101  CO2 25 24  GLUCOSE 165* 134*  BUN 19 26*  CREATININE 3.73* 5.50*  CALCIUM 8.4* 8.3*    Studies: CT ABDOMEN PELVIS WO CONTRAST Result Date: 05/14/2024 CLINICAL DATA:  Acute abdominal pain, nausea vomiting x2 weeks EXAM: CT ABDOMEN AND PELVIS WITHOUT CONTRAST TECHNIQUE: Multidetector CT imaging of the abdomen and pelvis was performed following the standard protocol without IV contrast. RADIATION DOSE REDUCTION: This exam was performed according to the departmental dose-optimization program which includes automated exposure control, adjustment of the mA and/or kV according to patient size and/or use of iterative reconstruction technique. COMPARISON:  August 22, 2023 CT FINDINGS: Lower chest: Unremarkable. Hepatobiliary: Likely cholelithiasis. No significant pericholecystic inflammation. No focal liver lesions. Pancreas: Unremarkable. Spleen: Unremarkable. Adrenals/Urinary Tract: No adrenal nodules. Mildly hyperdense, well-circumscribed bilateral  renal lesions, measuring up to 1.3 cm on the right and 1.3 cm on the left and slightly increased in size since prior study. Punctate nonobstructing renal stones. No hydronephrosis. 2.3 cm Simple right lower pole renal cyst. Stomach/Bowel: No evidence of bowel obstruction or inflammation. Appendix is unremarkable. Vascular/Lymphatic: No lymphadenopathy by size criteria. Reproductive: Unremarkable. Other: Unchanged superficial soft tissue stranding in the right lower quadrant. Recommend correlation with physical exam. Fat containing periumbilical hernia, similar to prior. Musculoskeletal: No acute osseous findings. IMPRESSION: 1. No acute pathology in the abdomen or pelvis. 2. Cholelithiasis. 3. Mildly hyperdense, well-circumscribed bilateral renal lesions mildly hyperdense, well-circumscribed bilateral renal lesions, slightly increased in size from prior study and favored to represent hemorrhagic or proteinaceous cysts. Nonurgent MRI or CT renal mass protocol with and without IV contrast may be performed for definitive characterization. 4. Stable additional chronic and incidental findings as noted above. Electronically Signed   By: Michaeline Blanch M.D.   On: 05/14/2024 12:01    Scheduled Meds:  aspirin  EC  81 mg Oral Daily   [START ON 05/17/2024] calcitRIOL   0.25 mcg Oral Q M,W,F-HD   cloNIDine   0.3 mg Oral BID   cyanocobalamin   1,000 mcg Oral Daily   docusate sodium   100 mg Oral BID   doxazosin   8 mg Oral Daily   DULoxetine   30 mg Oral QHS   ezetimibe   10 mg Oral QHS   gabapentin   600  mg Oral QHS   heparin   5,000 Units Subcutaneous Q8H   insulin  aspart  0-5 Units Subcutaneous QHS   insulin  aspart  0-6 Units Subcutaneous TID WC   insulin  glargine-yfgn  14 Units Subcutaneous Q2200   irbesartan   300 mg Oral Daily   isosorbide  mononitrate  120 mg Oral Daily   nystatin    Topical TID   oxyCODONE   5 mg Oral Once   pantoprazole   40 mg Oral BID   Continuous Infusions:  lactated ringers  100 mL/hr at  05/14/24 1432   promethazine  (PHENERGAN ) injection (IM or IVPB)     PRN Meds: acetaminophen  **OR** acetaminophen , albuterol , fentaNYL  (SUBLIMAZE ) injection, HYDROmorphone  (DILAUDID ) injection, hydrOXYzine , ondansetron  **OR** ondansetron  (ZOFRAN ) IV, promethazine  (PHENERGAN ) injection (IM or IVPB)  Time spent: 55 minutes  Author: ELVAN SOR. MD [facility] 05/15/2024 8:01 AM  To reach On-call, see care teams to locate the attending and reach out to them via [email or web address]. If 7PM-7AM, please contact night-coverage If you still have difficulty reaching the attending provider, please page the [facility] (Director on Call) for [facility] on amion for assistance.

## 2024-05-15 NOTE — Progress Notes (Signed)
 Pt has a dressing present upon initial skin assessment, pt verbalized that she is treated at the [facility], her next appointment is scheduled for Tuesday, 05/18/24.  The pt stated ya'll do not need to mess with that now, they take care of that at the wound center when this writer went to pick her RLE up.  Wound Consult entered in [facility]

## 2024-05-15 NOTE — ED Notes (Signed)
 According to Pts dexcom BGL was 125.

## 2024-05-16 DIAGNOSIS — K859 Acute pancreatitis without necrosis or infection, unspecified: Secondary | ICD-10-CM | POA: Diagnosis not present

## 2024-05-16 LAB — HEPATIC FUNCTION PANEL
ALT: 9 U/L (ref 0–44)
AST: 13 U/L — ABNORMAL LOW (ref 15–41)
Albumin: 2.9 g/dL — ABNORMAL LOW (ref 3.5–5.0)
Alkaline Phosphatase: 82 U/L (ref 38–126)
Bilirubin, Direct: 0.1 mg/dL (ref 0.0–0.2)
Indirect Bilirubin: 0.5 mg/dL (ref 0.3–0.9)
Total Bilirubin: 0.6 mg/dL (ref 0.0–1.2)
Total Protein: 7.1 g/dL (ref 6.5–8.1)

## 2024-05-16 LAB — BASIC METABOLIC PANEL WITH GFR
Anion gap: 9 (ref 5–15)
BUN: 32 mg/dL — ABNORMAL HIGH (ref 8–23)
CO2: 27 mmol/L (ref 22–32)
Calcium: 8.5 mg/dL — ABNORMAL LOW (ref 8.9–10.3)
Chloride: 100 mmol/L (ref 98–111)
Creatinine, Ser: 6.25 mg/dL — ABNORMAL HIGH (ref 0.44–1.00)
GFR, Estimated: 7 mL/min — ABNORMAL LOW (ref >60)
Glucose, Bld: 162 mg/dL — ABNORMAL HIGH (ref 70–99)
Potassium: 4 mmol/L (ref 3.5–5.1)
Sodium: 136 mmol/L (ref 135–145)

## 2024-05-16 LAB — CBC
HCT: 38.3 % (ref 36.0–46.0)
Hemoglobin: 11.3 g/dL — ABNORMAL LOW (ref 12.0–15.0)
MCH: 28.3 pg (ref 26.0–34.0)
MCHC: 29.5 g/dL — ABNORMAL LOW (ref 30.0–36.0)
MCV: 95.8 fL (ref 80.0–100.0)
Platelets: 126 K/uL — ABNORMAL LOW (ref 150–400)
RBC: 4 MIL/uL (ref 3.87–5.11)
RDW: 14.3 % (ref 11.5–15.5)
WBC: 4.8 K/uL (ref 4.0–10.5)
nRBC: 0 % (ref 0.0–0.2)

## 2024-05-16 LAB — VITAMIN D 25 HYDROXY (VIT D DEFICIENCY, FRACTURES): Vit D, 25-Hydroxy: 28.54 ng/mL — ABNORMAL LOW (ref 30–100)

## 2024-05-16 LAB — MAGNESIUM: Magnesium: 2.1 mg/dL (ref 1.7–2.4)

## 2024-05-16 LAB — GLUCOSE, CAPILLARY
Glucose-Capillary: 116 mg/dL — ABNORMAL HIGH (ref 70–99)
Glucose-Capillary: 233 mg/dL — ABNORMAL HIGH (ref 70–99)

## 2024-05-16 LAB — HEPATITIS B SURFACE ANTIGEN: Hepatitis B Surface Ag: NONREACTIVE

## 2024-05-16 LAB — LIPASE, BLOOD: Lipase: 95 U/L — ABNORMAL HIGH (ref 11–51)

## 2024-05-16 LAB — PHOSPHORUS: Phosphorus: 4.5 mg/dL (ref 2.5–4.6)

## 2024-05-16 MED ORDER — CHLORHEXIDINE GLUCONATE CLOTH 2 % EX PADS: 6.0000 | MEDICATED_PAD | Freq: Every day | CUTANEOUS | Status: DC

## 2024-05-16 NOTE — Consult Note (Signed)
 WOC Nurse Consult Note: Reason for Consult: RLE wound Patient has been followed by the [facility]; last seen 7/8.   Wound type: surgical  Pressure Injury POA: NA Measurement: 05/11/24 3.3cm x 1.4cm x 0.3cm  Wound azi:rozjw perWCC Drainage (amount, consistency, odor) serosanguinous per [facility]   Dressing procedure/placement/frequency: Cleanse wound with Vashe Soila [phone]), pat dry. Apply silver  hydrofiber (Aquacel Ag+) Gerlean # [id number].  We do not carry silver  collagen inpatient, silver  hydrofiber is the closest substitute.  Top with ABD pad and wrap with kerlix and Coban from base of toes to the patellar notch.   Change 3x per week, to verify with patient if WCC x 1 per week and HHRN? 2x wk.   WOC nursing will follow up this week on dressing changes.  Patient has refused to let nursing staff remove compression and change topical care at this time.   Denys Salinger [facility], CNS, [employer] [phone]

## 2024-05-16 NOTE — Plan of Care (Signed)
 Patient remains free from any noted signs of acute distress.  Has verbally denied any acute pain/discomfort.  Patient to continue to be monitored by hospital staff until discharged.

## 2024-05-16 NOTE — Progress Notes (Signed)
 MD order received in [facility] to discharge pt home; verbally reviewed AVS with pt including educational material for Lipase Test and Acute Pancreatitis; no questions voiced at this time; pt's discharge pending arrival of her son after he gets out of church today; verbally advised pt to let her son know that all he has to do is pull up to the [facility] entrance and call her to advise that he is here in order for staff to get a wheelchair and bring her to the [facility] entrance for discharge; pt verbalized understanding

## 2024-05-16 NOTE — Discharge Summary (Signed)
 [facility] Discharge Summary   Patient: Rachel Holmes FMW:[id number]  PCP: Zachary Idelia LABOR, MD  Date of admission: 05/14/2024   Date of discharge:  05/16/2024     Discharge Diagnoses:  Principal Problem:   Acute pancreatitis Active Problems:   Morbid obesity with BMI of 45.0-49.9, adult (HCC)   ESRD on hemodialysis (HCC)   Type 2 diabetes mellitus with diabetic peripheral angiopathy without gangrene, with long-term current use of insulin  (HCC)   Hypertension   Hx of BKA, left (HCC)   PVD (peripheral vascular disease) (HCC)   Chronic pain syndrome   Gastro-esophageal reflux disease without esophagitis   HTN (hypertension), malignant   Difficulty walking   Candidiasis, intertrigo   Admitted From: Home Disposition:  Home   Recommendations for Outpatient Follow-up:  F/u with PCP in 1 wk,  F/u with Nephro for HD MWF schedule F/u with Endocrine, pt started Mounjaro and it might be the cause of her pancreatitis? Follow up LABS/TEST:  repeat Lipase in 1 wk if recurrent Abd pain  For renal cyst: Nonurgent MRI or CT renal mass protocol with and without IV contrast may be performed for definitive characterization.   Follow-up Information     George, Sionne A, MD Follow up in 1 week(s).   Specialty: Family Medicine Contact information: [address] [place] [address] [phone]                Diet recommendation: low fiber soft diet, renal/carb modified diet.  Small portions multiple meals in a day.  And then gradually back to normal meal portions.  Activity: The patient is advised to gradually reintroduce usual activities, as tolerated  Discharge Condition: stable  Code Status: Full code   History of present illness: As per the H and P dictated on admission  Hospital Course:  Rachel Holmes is a 63 year old female with history of morbid obesity, hypertension, end-stage renal disease on hemodialysis MWF, s/p Left BKA, chronic right lower  extremity edema following wound care, compression dressing intact, who presents emergency department for chief concerns of abdominal pain, nausea, vomiting, inability to take anything down.   Vitals in the ED showed T of 98, rr 18, hr 66, blood pressure 155/66, SpO2 100% on room air.   Serum sodium is 135, potassium 3.9, chloride 99, bicarb 25, BUN of 19, serum creatinine of 3.73, eGFR 13, nonfasting blood glucose 165, WBC 6.5, hemoglobin 11, platelets 183.   HS troponin was 10.  Lipase was elevated at 295.  Patient was found to have acute pancreatitis.  TR[facility] was consulted for admission and further management as below.   Assessment and Plan:   # Acute pancreatitis, possible secondary to Mounjaro S/p LR infusion at 100 mL/h overnight, decreased to 75 mL/h S/p Dilaudid  0.5 mg IV every 4 hours prn for moderate pain, and fentanyl  25 mcg IV every 4 hours prn for severe pain. Patient tolerated clear liquid diet, advance to full liquid and soft diet at dinner.  Patient tolerated diet well, abdominal pain resolved, no nausea vomiting. Lipase 295>>95 trending down.  Patient was advised to continue soft diet and small portion meals for few days.  Then advance diet gradually.  Follow-up with PCP, may repeat lipase if recurrent abdominal pain. Patient was advised to follow-up with endocrine, held Mounjaro for now.   # Type 2 diabetes mellitus with diabetic peripheral angiopathy without gangrene, with long-term current use of insulin .  S/p long-acting and NovoLog  sliding scale insulin  given.  Patient was  advised to hold Mounjaro for now due to possible side effect causing acute pancreatitis.  Resumed insulin  Tresiba , and lispro.  Patient was advised to monitor CBG at home and continue diabetic diet.  Follow-up with PCP and endocrine in 1 week.  # ESRD on hemodialysis: Nephrology was consulted.  Patient is on MWF schedule.  Index hemodialysis will be on Monday.   # Chronic pain syndrome PDMP reviewed,  currently gabapentin  is active prescription   # Hypertension: Resumed home medications.  BP stable.  Advised to monitor BP at home and follow with PCP.   # Candidiasis, intertrigo: s/p Nystatin  powder TID.  Follow-up with PCP as an outpatient for further management. # Gastro-esophageal reflux disease without esophagitis Home PPI equivalent resumed   # Renal cyst, incidental finding.  well-circumscribed bilateral renal lesions mildly hyperdense, well-circumscribed bilateral renal lesions, slightly increased in size from prior study and favored to represent hemorrhagic or proteinaceous cysts. Nonurgent MRI or CT renal mass protocol with and without IV contrast may be performed for definitive characterization. Patient was advised to follow-up with PCP and nephrology as an outpatient for further management  # Chronic right lower extremity edema, compression dressing intact.  Patient is following wound care as an outpatient. Pt is s/p left BKA   Obesity class II Body mass index is 47.14 kg/m.  Interventions: Calorie restricted diet and daily exercise advised to lose body weight.  Lifestyle modification discussed.    Body mass index is 46.12 kg/m.  Nutrition Interventions:  - Patient was instructed, not to drive, operate heavy machinery, perform activities at heights, swimming or participation in water activities or provide baby sitting services while on Pain, Sleep and Anxiety Medications; until her outpatient Physician has advised to do so again.  - Also recommended to not to take more than prescribed Pain, Sleep and Anxiety Medications.  Patient was ambulatory without any assistance. On the day of the discharge the patient's vitals were stable, and no other acute medical condition were reported by patient. the patient was felt safe to be discharge at Home .  Consultants: Nephrology Procedures: None  Discharge Exam: General: Appear in no distress, no Rash; Oral Mucosa Clear,  moist. Cardiovascular: S1 and S2 Present, no Murmur, Respiratory: normal respiratory effort, Bilateral Air entry present and no Crackles, no wheezes Abdomen: Bowel Sound present, Soft and no tenderness, no hernia Extremities: s/p left BKA, RLE chronic edema, compression dressing intact, following wound care Neurology: alert and oriented to time, place, and person affect appropriate.  Filed Weights   05/14/24 0826 05/15/24 1324  Weight: (!) 140.6 kg (!) 137.6 kg   Vitals:   05/16/24 0426 05/16/24 0945  BP: (!) 159/54 (!) 160/71  Pulse: (!) 53 (!) 57  Resp:    Temp: 97.6 F (36.4 C) 98.3 F (36.8 C)  SpO2: 94% 98%    DISCHARGE MEDICATION: Allergies as of 05/16/2024       Reactions   Nifedipine Rash   Penicillins Hives, Shortness Of Breath, Swelling   Statins Shortness Of Breath   Wheezing   Hydralazine  Itching   Hydrocodone  Itching, Nausea Only   Iodinated Contrast Media Other (See Comments)   ESRD   Metoprolol Diarrhea, Other (See Comments)   Pt states make my heart race   Oxycodone  Nausea Only   Codeine Nausea Only   Ibuprofen  Other (See Comments)   Raises blood pressure        Medication List     PAUSE taking these medications    Mounjaro  12.5 MG/0.5ML Pen Wait to take this until your doctor or other care provider tells you to start again. Generic drug: tirzepatide Inject 12.5 mg into the skin.  Inject 0.5 mLs (12.5 mg total) subcutaneously once a week       STOP taking these medications    cetirizine 10 MG tablet Commonly known as: ZYRTEC       TAKE these medications    acetaminophen  500 MG tablet Commonly known as: TYLENOL  Take 2 tablets (1,000 mg total) by mouth every 6 (six) hours as needed.   albuterol  108 (90 Base) MCG/ACT inhaler Commonly known as: VENTOLIN  HFA Inhale 1-2 puffs into the lungs every 6 (six) hours as needed for wheezing or shortness of breath.   aspirin  EC 81 MG tablet Take 81 mg by mouth daily. Swallow whole.    calcitRIOL  0.25 MCG capsule Commonly known as: ROCALTROL  Take 0.25 mcg by mouth every Monday, Wednesday, and Friday with hemodialysis.   clobetasol cream 0.05 % Commonly known as: TEMOVATE Apply 1 application  topically 2 (two) times daily as needed (rash).   cloNIDine  0.3 MG tablet Commonly known as: CATAPRES  Take 1 tablet (0.3 mg total) by mouth 2 (two) times daily.   cyanocobalamin  1000 MCG tablet Commonly known as: VITAMIN B12 Take 1,000 mcg by mouth daily.   docusate sodium  100 MG capsule Commonly known as: COLACE Take 1 capsule (100 mg total) by mouth 2 (two) times daily.   doxazosin  8 MG tablet Commonly known as: CARDURA  Take 8 mg by mouth daily.   DULoxetine  30 MG capsule Commonly known as: CYMBALTA  Take 30 mg by mouth at bedtime.   ezetimibe  10 MG tablet Commonly known as: ZETIA  Take 1 tablet (10 mg total) by mouth daily.   FreeStyle Libre 14 Day Reader Espiridion USE AS DIRECTED   gabapentin  600 MG tablet Commonly known as: NEURONTIN  Take 600 mg by mouth at bedtime.   glucosamine-chondroitin 500-400 MG tablet Take 1 tablet by mouth in the morning and at bedtime.   hydrOXYzine  25 MG tablet Commonly known as: ATARAX  Take 25 mg by mouth every 6 (six) hours as needed for itching.   insulin  degludec 100 UNIT/ML FlexTouch Pen Commonly known as: TRESIBA  Inject 10 Units into the skin.  Inject 10 Units subcutaneously at bedtime   isosorbide  mononitrate 120 MG 24 hr tablet Commonly known as: IMDUR  Take 1 tablet (120 mg total) by mouth in the morning and at bedtime.   lactulose  10 GM/15ML solution Commonly known as: CHRONULAC  Take 15 mLs by mouth daily as needed for moderate constipation.   Lyumjev  KwikPen 100 UNIT/ML KwikPen Generic drug: Insulin  Lispro-aabc Inject 10-20 Units into the skin 3 (three) times daily before meals. With meals   Magnesium  400 MG Caps Take 400 mg by mouth at bedtime.   multivitamin with minerals Tabs tablet Take 1 tablet by mouth  daily.   olmesartan 40 MG tablet Commonly known as: BENICAR Take 40 mg by mouth daily.   omeprazole 40 MG capsule Commonly known as: PRILOSEC Take 40 mg by mouth 2 (two) times daily with breakfast and lunch.   ondansetron  4 MG tablet Commonly known as: ZOFRAN  TAKE 1 TABLET BY MOUTH EVERY 8 HOURS AS NEEDED FOR UP TO 14 DAYS FOR NAUSEA OR VOMITING   onetouch ultrasoft lancets Used to check blood sugars four times daily.   OneTouch Verio test strip Generic drug: glucose blood USE TO CHECK BLOOD SUGAR TWICE DAILY   Santyl  250 UNIT/GM ointment Generic drug: collagenase  Apply  topically daily. Wound to lateral ankle is 8 cm by 4 cm   torsemide  100 MG tablet Commonly known as: DEMADEX  Take 1 tablet (100 mg total) by mouth 2 (two) times daily.               Discharge Care Instructions  (From admission, onward)           Start     Ordered   05/16/24 0000  Discharge wound care:       Comments: As per [facility]   05/16/24 1037           Allergies  Allergen Reactions   Nifedipine Rash   Penicillins Hives, Shortness Of Breath and Swelling   Statins Shortness Of Breath    Wheezing   Hydralazine  Itching   Hydrocodone  Itching and Nausea Only   Iodinated Contrast Media Other (See Comments)    ESRD   Metoprolol Diarrhea and Other (See Comments)    Pt states make my heart race   Oxycodone  Nausea Only   Codeine Nausea Only   Ibuprofen  Other (See Comments)    Raises blood pressure   Discharge Instructions     Call MD for:  extreme fatigue   Complete by: As directed    Call MD for:  persistant dizziness or light-headedness   Complete by: As directed    Call MD for:  persistant nausea and vomiting   Complete by: As directed    Call MD for:  severe uncontrolled pain   Complete by: As directed    Call MD for:  temperature >100.4   Complete by: As directed    Diet Carb Modified   Complete by: As directed    Discharge instructions   Complete by: As directed     F/u with PCP in 1 wk, repeat Lipase in 1 wk if recurrent Abd pain  F/u with Nephro for HD MWF schedule F/u with Endocrine, pt started Mounjaro and it might be the cause of her pancreatitis?   Discharge wound care:   Complete by: As directed    As per [facility]   Increase activity slowly   Complete by: As directed        The results of significant diagnostics from this hospitalization (including imaging, microbiology, ancillary and laboratory) are listed below for reference.    Significant Diagnostic Studies: CT ABDOMEN PELVIS WO CONTRAST Result Date: 05/14/2024 CLINICAL DATA:  Acute abdominal pain, nausea vomiting x2 weeks EXAM: CT ABDOMEN AND PELVIS WITHOUT CONTRAST TECHNIQUE: Multidetector CT imaging of the abdomen and pelvis was performed following the standard protocol without IV contrast. RADIATION DOSE REDUCTION: This exam was performed according to the departmental dose-optimization program which includes automated exposure control, adjustment of the mA and/or kV according to patient size and/or use of iterative reconstruction technique. COMPARISON:  August 22, 2023 CT FINDINGS: Lower chest: Unremarkable. Hepatobiliary: Likely cholelithiasis. No significant pericholecystic inflammation. No focal liver lesions. Pancreas: Unremarkable. Spleen: Unremarkable. Adrenals/Urinary Tract: No adrenal nodules. Mildly hyperdense, well-circumscribed bilateral renal lesions, measuring up to 1.3 cm on the right and 1.3 cm on the left and slightly increased in size since prior study. Punctate nonobstructing renal stones. No hydronephrosis. 2.3 cm Simple right lower pole renal cyst. Stomach/Bowel: No evidence of bowel obstruction or inflammation. Appendix is unremarkable. Vascular/Lymphatic: No lymphadenopathy by size criteria. Reproductive: Unremarkable. Other: Unchanged superficial soft tissue stranding in the right lower quadrant. Recommend correlation with physical exam. Fat containing periumbilical hernia,  similar to prior. Musculoskeletal: No acute osseous findings. IMPRESSION:  1. No acute pathology in the abdomen or pelvis. 2. Cholelithiasis. 3. Mildly hyperdense, well-circumscribed bilateral renal lesions mildly hyperdense, well-circumscribed bilateral renal lesions, slightly increased in size from prior study and favored to represent hemorrhagic or proteinaceous cysts. Nonurgent MRI or CT renal mass protocol with and without IV contrast may be performed for definitive characterization. 4. Stable additional chronic and incidental findings as noted above. Electronically Signed   By: Michaeline Blanch M.D.   On: 05/14/2024 12:01    Microbiology: No results found for this or any previous visit (from the past 240 hours).   Labs: CBC: Recent Labs  Lab 05/14/24 0828 05/15/24 0724 05/16/24 0416  WBC 6.5 5.7 4.8  HGB 11.0* 11.0* 11.3*  HCT 36.1 37.4 38.3  MCV 92.3 96.4 95.8  PLT 185 195 126*   Basic Metabolic Panel: Recent Labs  Lab 05/14/24 0828 05/15/24 0724 05/15/24 0800 05/16/24 0416  NA 135 136  --  136  K 3.9 4.4  --  4.0  CL 99 101  --  100  CO2 25 24  --  27  GLUCOSE 165* 134*  --  162*  BUN 19 26*  --  32*  CREATININE 3.73* 5.50*  --  6.25*  CALCIUM 8.4* 8.3*  --  8.5*  MG  --   --  2.1 2.1  PHOS  --   --  4.5 4.5   Liver Function Tests: Recent Labs  Lab 05/14/24 0828 05/16/24 0416  AST 18 13*  ALT 10 9  ALKPHOS 83 82  BILITOT 0.7 0.6  PROT 6.7 7.1  ALBUMIN  2.9* 2.9*   Recent Labs  Lab 05/14/24 0828 05/15/24 0724 05/16/24 0416  LIPASE 295* 96* 95*   No results for input(s): AMMONIA in the last 168 hours. Cardiac Enzymes: No results for input(s): CKTOTAL, CKMB, CKMBINDEX, TROPONINI in the last 168 hours. BNP (last 3 results) No results for input(s): BNP in the last 8760 hours. CBG: Recent Labs  Lab 05/15/24 1151 05/15/24 1736 05/15/24 2127 05/16/24 0913  GLUCAP 157* 124* 197* 116*    Time spent: 35 minutes  Signed:  Elvan Sor  [facility] 05/16/2024 10:40 AM

## 2024-05-16 NOTE — Progress Notes (Signed)
 [facility]'s [facility]; pt discharged via wheelchair by nursing to the [facility]

## 2024-05-16 NOTE — Progress Notes (Addendum)
 [facility]  ROUNDING NOTE   Subjective:   Doing well Expecting to be d/c today States she was able to tolerate regular food without pain   Objective:  Vital signs in last 24 hours:  Temp:  [97.6 F (36.4 C)-98.3 F (36.8 C)] 98.3 F (36.8 C) (07/13 0945) Pulse Rate:  [53-61] 57 (07/13 0945) Resp:  [16] 16 (07/12 1710) BP: (130-160)/(43-71) 160/71 (07/13 0945) SpO2:  [94 %-98 %] 98 % (07/13 0945) Weight:  [137.6 kg] 137.6 kg (07/12 1324)  Weight change: -3.015 kg Filed Weights   05/14/24 0826 05/15/24 1324  Weight: (!) 140.6 kg (!) 137.6 kg    Intake/Output: I/O last 3 completed shifts: In: 2883.3 [P.O.:460; I.V.:2423.3] Out: 100 [Urine:100]   Intake/Output this shift:  Total I/O In: 360 [P.O.:360] Out: -   Physical Exam: General: NAD, pleasant  Head: Normocephalic  Eyes: Anicteric  Neck: Supple  Lungs:  Normal effort, clear  Heart: Regular rate and rhythm  Abdomen:  Soft, nontender,   Extremities:  Wrapped BLL, trace peripheral edema.  Neurologic: Nonfocal, moving all four extremities  Skin: No lesions  Access: Left AVF    Basic Metabolic Panel: Recent Labs  Lab 05/14/24 0828 05/15/24 0724 05/15/24 0800 05/16/24 0416  NA 135 136  --  136  K 3.9 4.4  --  4.0  CL 99 101  --  100  CO2 25 24  --  27  GLUCOSE 165* 134*  --  162*  BUN 19 26*  --  32*  CREATININE 3.73* 5.50*  --  6.25*  CALCIUM 8.4* 8.3*  --  8.5*  MG  --   --  2.1 2.1  PHOS  --   --  4.5 4.5    Liver Function Tests: Recent Labs  Lab 05/14/24 0828 05/16/24 0416  AST 18 13*  ALT 10 9  ALKPHOS 83 82  BILITOT 0.7 0.6  PROT 6.7 7.1  ALBUMIN  2.9* 2.9*   Recent Labs  Lab 05/14/24 0828 05/15/24 0724 05/16/24 0416  LIPASE 295* 96* 95*   No results for input(s): AMMONIA in the last 168 hours.  CBC: Recent Labs  Lab 05/14/24 0828 05/15/24 0724 05/16/24 0416  WBC 6.5 5.7 4.8  HGB 11.0* 11.0* 11.3*  HCT 36.1 37.4 38.3  MCV 92.3 96.4 95.8  PLT 185 195  126*    Cardiac Enzymes: No results for input(s): CKTOTAL, CKMB, CKMBINDEX, TROPONINI in the last 168 hours.  BNP: Invalid input(s): POCBNP  CBG: Recent Labs  Lab 05/15/24 1151 05/15/24 1736 05/15/24 2127 05/16/24 0913 05/16/24 1142  GLUCAP 157* 124* 197* 116* 233*    Microbiology: Results for orders placed or performed during the hospital encounter of 03/30/24  Culture, blood (routine x 2)     Status: None   Collection Time: 03/30/24  9:11 PM   Specimen: BLOOD  Result Value Ref Range Status   Specimen Description BLOOD RIGHT HAND  Final   Special Requests   Final    BOTTLES DRAWN AEROBIC AND ANAEROBIC Blood Culture results may not be optimal due to an inadequate volume of blood received in culture bottles   Culture   Final    NO GROWTH 5 DAYS Performed at [facility], [address], [place], [place] [address]    Report Status 04/04/2024 FINAL  Final  Culture, blood (routine x 2)     Status: None   Collection Time: 03/30/24  9:38 PM   Specimen: BLOOD  Result Value Ref Range  Status   Specimen Description BLOOD BLOOD RIGHT HAND  Final   Special Requests   Final    BOTTLES DRAWN AEROBIC AND ANAEROBIC Blood Culture results may not be optimal due to an inadequate volume of blood received in culture bottles   Culture   Final    NO GROWTH 5 DAYS Performed at [facility], [address] [place], [place] [address]    Report Status 04/04/2024 FINAL  Final  Aerobic/Anaerobic Culture w Gram Stain (surgical/deep wound)     Status: None   Collection Time: 04/01/24 10:18 AM   Specimen: Wound  Result Value Ref Range Status   Specimen Description   Final    WOUND right ankel Performed at [facility], [address], [place], [place] [address]    Special Requests   Final    NONE Performed at [facility], [address] [place], [place] [address]    Gram Stain   Final    RARE WBC PRESENT, PREDOMINANTLY PMN NO ORGANISMS  SEEN    Culture   Final    RARE PSEUDOMONAS AERUGINOSA NO ANAEROBES ISOLATED Performed at [facility], [address], [place], [place] [address]    Report Status 04/06/2024 FINAL  Final   Organism ID, Bacteria PSEUDOMONAS AERUGINOSA  Final      Susceptibility   Pseudomonas aeruginosa - MIC*    CEFTAZIDIME <=1 SENSITIVE Sensitive     CIPROFLOXACIN  <=0.25 SENSITIVE Sensitive     GENTAMICIN <=1 SENSITIVE Sensitive     IMIPENEM 2 SENSITIVE Sensitive     PIP/TAZO <=4 SENSITIVE Sensitive ug/mL    CEFEPIME  1 SENSITIVE Sensitive     * RARE PSEUDOMONAS AERUGINOSA    Coagulation Studies: No results for input(s): LABPROT, INR in the last 72 hours.  Urinalysis: No results for input(s): COLORURINE, LABSPEC, PHURINE, GLUCOSEU, HGBUR, BILIRUBINUR, KETONESUR, PROTEINUR, UROBILINOGEN, NITRITE, LEUKOCYTESUR in the last 72 hours.  Invalid input(s): APPERANCEUR    Imaging: No results found.   Medications:     aspirin  EC  81 mg Oral Daily   bisacodyl   10 mg Oral QHS   [START ON 05/17/2024] calcitRIOL   0.25 mcg Oral Q M,W,F-HD   cloNIDine   0.3 mg Oral BID   cyanocobalamin   1,000 mcg Oral Daily   docusate sodium   100 mg Oral BID   doxazosin   8 mg Oral Daily   DULoxetine   30 mg Oral QHS   ezetimibe   10 mg Oral QHS   gabapentin   600 mg Oral QHS   heparin   5,000 Units Subcutaneous Q8H   insulin  aspart  0-5 Units Subcutaneous QHS   insulin  aspart  0-6 Units Subcutaneous TID WC   insulin  glargine-yfgn  14 Units Subcutaneous Q2200   irbesartan   300 mg Oral Daily   isosorbide  mononitrate  120 mg Oral Daily   lactulose   20 g Oral BID   nystatin    Topical TID   oxyCODONE   5 mg Oral Once   pantoprazole   40 mg Oral BID   acetaminophen  **OR** acetaminophen , albuterol , bisacodyl , hydrOXYzine , ondansetron  **OR** ondansetron  (ZOFRAN ) IV  Assessment/ Plan:  [name] is a 63 y.o.  female with past medical conditions including anemia, anxiety, CHF,  diabetes, diabetic nephropathy, GERD, hyperlipidemia, legally blind in left eye, and end-stage renal disease on hemodialysis.  Patient presented to the emergency department for abdominal pain found to have pancreatitis. Patient is known to our practice and receives outpatient dialysis at [facility] on a MWF schedule.  [facility]/MWF/left aVF   1. End stage renal disease on hemodialysis.  Last dialysis 05/14/24 in outpatient. Next treatment for tomorrow as scheduled.    2. Anemia of chronic kidney disease Receives Mircera outpatient Hgb at goal, no need for ESA at this time   3. Secondary Hyperparathyroidism: with outpatient labs: PTH 320, phosphorus 4.4, calcium 8.1 on 03/12/24.    Lab Results  Component Value Date   CALCIUM 8.5 (L) 05/16/2024   CAION 1.05 (L) 01/22/2024   PHOS 4.5 05/16/2024      Continuing to monitor bone minerals during this admission.   Prescribed calcitriol  outpatient.    4. Hypertension with chronic kidney disease.  Home regimen includes clonidine , doxazosin , olmasartan, and torsemide .  Currently on clonidine , doxazosin , irbesartan  and isosorbide  mononitrate.  Blood pressure today 160/71  5. Acute pancreatitis             Patient was given IVF infusion for 1 day strict intake and output and symptomatic support for pain. Today patient reports no pain and is able to eat more without consequence.    LOS: 2 Ramonita SHAUNNA Dines 7/13/202512:50 PM Patient was seen and examined with Dines Ramonita, NP.  Plan of care was formulated for the problems addressed and discussed with the nurse practitioner. I agree with the note as documented except as noted below.

## 2024-05-18 ENCOUNTER — Encounter: Admitting: Physician Assistant

## 2024-05-18 DIAGNOSIS — I5042 Chronic combined systolic (congestive) and diastolic (congestive) heart failure: Secondary | ICD-10-CM | POA: Diagnosis not present

## 2024-05-18 DIAGNOSIS — N186 End stage renal disease: Secondary | ICD-10-CM | POA: Diagnosis not present

## 2024-05-18 DIAGNOSIS — L97312 Non-pressure chronic ulcer of right ankle with fat layer exposed: Secondary | ICD-10-CM | POA: Diagnosis not present

## 2024-05-18 DIAGNOSIS — T8131XA Disruption of external operation (surgical) wound, not elsewhere classified, initial encounter: Secondary | ICD-10-CM | POA: Diagnosis not present

## 2024-05-18 DIAGNOSIS — Z992 Dependence on renal dialysis: Secondary | ICD-10-CM | POA: Diagnosis not present

## 2024-05-18 DIAGNOSIS — E1143 Type 2 diabetes mellitus with diabetic autonomic (poly)neuropathy: Secondary | ICD-10-CM | POA: Diagnosis not present

## 2024-05-18 DIAGNOSIS — I890 Lymphedema, not elsewhere classified: Secondary | ICD-10-CM | POA: Diagnosis not present

## 2024-05-18 DIAGNOSIS — E1122 Type 2 diabetes mellitus with diabetic chronic kidney disease: Secondary | ICD-10-CM | POA: Diagnosis not present

## 2024-05-18 DIAGNOSIS — E11621 Type 2 diabetes mellitus with foot ulcer: Secondary | ICD-10-CM | POA: Diagnosis present

## 2024-05-18 DIAGNOSIS — Z89512 Acquired absence of left leg below knee: Secondary | ICD-10-CM | POA: Diagnosis not present

## 2024-05-18 DIAGNOSIS — I132 Hypertensive heart and chronic kidney disease with heart failure and with stage 5 chronic kidney disease, or end stage renal disease: Secondary | ICD-10-CM | POA: Diagnosis not present

## 2024-05-27 ENCOUNTER — Encounter: Admitting: Physician Assistant

## 2024-05-27 DIAGNOSIS — T8131XA Disruption of external operation (surgical) wound, not elsewhere classified, initial encounter: Secondary | ICD-10-CM | POA: Diagnosis not present

## 2024-06-02 ENCOUNTER — Encounter: Admitting: Physician Assistant

## 2024-06-02 DIAGNOSIS — T8131XA Disruption of external operation (surgical) wound, not elsewhere classified, initial encounter: Secondary | ICD-10-CM | POA: Diagnosis not present

## 2024-06-04 NOTE — Progress Notes (Deleted)
 Evaluation Performed:  Follow-up visit  Date:  06/04/2024   ID:  Rachel Holmes, DOB 01-Mar-1961, MRN [id number]  Patient Location:  [address] [place] [address]   Provider location:   [facility], [place] office  PCP:  Zachary Idelia LABOR, MD  Cardiologist:  [facility]  No chief complaint on file.   History of Present Illness:    Rachel Holmes is a 63 y.o. female past medical history of Hypertension;  Anemia of chronic disease;  Obesity, unspecified;  CHF (congestive heart failure) (CMS-HCC);  GERD (gastroesophageal reflux disease);  Bradycardia;  Anxiety,  Hyperparathyroidism (CMS-HCC); Increased PTH level;  CKD (chronic kidney disease) stage 3, GFR 30-59 ml/min;   Type 2 diabetes mellitus with stage 3 chronic kidney disease (CMS-HCC);   Lymphedema   Charcot's joint of left foot  profound Charcot foot deformity in association with this large ulcer Skin ulcer of left ankle with necrosis of bone (HCC)  knee amputation on the left group G streptococcal bacteremia with aortic valve endocarditis  Leg swelling on Ca channel blockers Who presents for follow-up of her chronic diastolic CHF  Last seen by myself in clinic 10/24  Notes from primary care August 14, 2023 indicate blood pressure has been running high  A1c 8.7 down to 7.9 Creatinine 4.7 BUN 34 sodium 133 at that time CKD stage V Recommendation at that time to start dialysis She has a left arm brachiocephalic fistula that was created in January 2023.   On HD on Mon/Wed/Fri Dry weight 142 kg  Recent fall  last month in bathroom on right side Did not have leg in place  Torsemide  100 mg twice a day, bathroom 2x a day Hold clonidine  on non HD days She has not been taking clonidine  when she gets home after HD Sees Dr. Dennise  On ozempic, on 1 mg past month Recent move to 2 mg  EKG personally reviewed by myself on todays visit      Other past medical  history reviewed In hospital 12/2018 Sepsis secondary to group G streptococcal bacteremia with aortic valve endocarditis and left leg soft tissue infection: Both TTE and TEE confirmed aortic valve endocarditis. Acute hypoxic respiratory failure likely secondary to aspiration pneumonia and decompensated diastolic heart failure: Occurred after extubation post TEE on  2/24, required BiPAP briefly. --done with ABX  [facility]  2017 stress test done Prior cardiac catheterization.    Prior CV studies:   The following studies were reviewed today:  Echo 2017 NORMAL LEFT VENTRICULAR SYSTOLIC FUNCTION   WITH MILD LVH NORMAL RIGHT VENTRICULAR SYSTOLIC FUNCTION MILD VALVULAR REGURGITATION (See above) NO PERICARDIAL EFFUSION GRADE 2 DIASTOLIC DYSFUNCTION MILD AORTIC STENOSIS MILDLY DILATED LEFT ATRIUM MILD PULMONARY HTN WITH ESTIMATED RVSP = 45 MMHG   Echo 12/2018 . Small aortic valve mass, cannot exclude endocarditis vegetation. Measures approximately 0.75 x 1 cm. Demonstrates features of independent motion and is primarily seen on the ventricular aspect of the aortic valve. Best visualized in parasternal long  axis and short axis views. No significant aortic valve stenosis or regurgitation.  2. The left ventricle has normal systolic function, with an ejection fraction of 55-60%. The cavity size was normal. Left ventricular diastolic Doppler parameters are consistent with impaired relaxation.  3. The right ventricle has normal systolic function. The cavity was normal. There is no increase in right ventricular wall thickness. Right ventricular systolic pressure is mildly elevated with an estimated pressure of 35.5 mmHg.  4. The mitral valve is normal in structure.  5. The tricuspid valve is normal in structure.  6. The pulmonic valve was normal in structure.  7. The aortic valve is normal in structure.  8. Cannot exclude small PFO with left to right shunt by color flow Doppler.  9. The inferior vena  cava was dilated in size with <50% respiratory variability.   Past Medical History:  Diagnosis Date   Allergic rhinitis    Allergy    Anemia    Anxiety    Aortic stenosis 09/13/2016   a.) TTE 09/13/2016: mild AS --> MPG 11.2 mmHg. b.) TTE 12/27/2020: EF 55-60%; no AS --> MPG 8 mmHg.   Aortic valve endocarditis 12/25/2018   a.) TTE 12/25/2018: 0.75 x 1 cm AV mass. b.) TEE 12/28/2018: small shaggy mobile density on aortic side of RIGHT coronary cusp consistent with vegetation.   Arthritis    Back pain, chronic    Bimalleolar fracture of right ankle 01/2024   Bradycardia    Breast mass    Patient can no longer palpate specific masses but showed tech general area of concern   Charcot's joint of ankle, left    CHF (congestive heart failure) (HCC) 07/09/2007   a.) TTE 07/09/2007: EF 50%; G1DD. b.) TTE 09/13/2016: EF 55%; G2DD. c.) TTE 12/27/2020: EF 55-60%; G2DD; GLS -15.1%.; d.) TTE 09/04/2023: EF 50-55%, G1DD, mild LAE, mild MR, mod AoV calc   Complication of anesthesia    a.) PONV. b.) Delayed emergence   Constipation    Diabetic nephropathy (HCC)    DVT (deep venous thrombosis) (HCC)    Dyspnea    ESRD (end stage renal disease) on dialysis [facility])    Family history of adverse reaction to anesthesia    a.) Sisters x 2 with (+) delayed emergence.   GERD (gastroesophageal reflux disease)    Heart murmur    HLD (hyperlipidemia)    Hyperparathyroidism (HCC)    Hypertension    Legally blind in left eye, as defined in [place]     Lymphedema    Mild pulmonary hypertension (HCC) 09/13/2016   a.) TTE 09/13/2016: EF 55%; RVSP 45 mmHg. b.) TTE 12/25/2018: EF 55-60%; RVSP 35.5 mmHg.   Obesity    Onychomycosis    PONV (postoperative nausea and vomiting)    S/P BKA (below knee amputation) unilateral, left (HCC)    Sepsis (HCC) 12/2018   a.) group G streptococcal bacteremia with AV endocarditis and LLE soft tissue infection.   T2DM (type 2 diabetes mellitus) [facility])    Past Surgical History:   Procedure Laterality Date   A/V FISTULAGRAM Left 06/18/2022   Procedure: A/V Fistulagram;  Surgeon: Jama Cordella MATSU, MD;  Location: [facility];  Service: Cardiovascular;  Laterality: Left;   A/V FISTULAGRAM Left 08/13/2022   Procedure: A/V Fistulagram;  Surgeon: Jama Cordella MATSU, MD;  Location: [facility];  Service: Cardiovascular;  Laterality: Left;   ABDOMINAL HYSTERECTOMY     AMPUTATION Left 05/05/2019   Procedure: AMPUTATION BELOW KNEE;  Surgeon: Jama Cordella MATSU, MD;  Location: [facility];  Service: Vascular;  Laterality: Left;   AV FISTULA PLACEMENT Right 09/19/2021   Procedure: ARTERIOVENOUS (AV) FISTULA CREATION (BRACHIAL CEPHALIC);  Surgeon: Jama Cordella MATSU, MD;  Location: [facility];  Service: Vascular;  Laterality: Right;   AV FISTULA PLACEMENT Left 11/09/2021   Procedure: ARTERIOVENOUS (AV) FISTULA CREATION ( BRACHIAL CEPHALIC );  Surgeon: Jama Cordella MATSU, MD;  Location: [facility];  Service: Vascular;  Laterality:  Left;   BREAST BIOPSY Left 2014   FNA 12:00 position - Negative   CATARACT EXTRACTION Bilateral    DIALYSIS/PERMA CATHETER INSERTION N/A 03/28/2022   Procedure: DIALYSIS/PERMA CATHETER INSERTION;  Surgeon: Marea Selinda RAMAN, MD;  Location: [facility];  Service: Cardiovascular;  Laterality: N/A;   DIALYSIS/PERMA CATHETER REMOVAL N/A 12/09/2022   Procedure: DIALYSIS/PERMA CATHETER REMOVAL;  Surgeon: Marea Selinda RAMAN, MD;  Location: [facility];  Service: Cardiovascular;  Laterality: N/A;   EYE SURGERY Left 2007   removed a lens, no lens implanted   IR FLUORO GUIDE CV LINE RIGHT  12/29/2018   IR REMOVAL TUN CV CATH W/O FL  02/19/2019   IR US  GUIDE VASC ACCESS RIGHT  12/29/2018   ORIF ANKLE FRACTURE Right 01/22/2024   Procedure: OPEN REDUCTION INTERNAL FIXATION (ORIF) ANKLE FRACTURE;  Surgeon: Onesimo Oneil LABOR, MD;  Location: [facility];  Service: Orthopedics;  Laterality: Right;   REVISON OF ARTERIOVENOUS FISTULA Left 10/16/2022    Procedure: REVISON OF ARTERIOVENOUS FISTULA;  Surgeon: Jama Cordella MATSU, MD;  Location: [facility];  Service: Vascular;  Laterality: Left;   TEE WITHOUT CARDIOVERSION N/A 12/28/2018   Procedure: TRANSESOPHAGEAL ECHOCARDIOGRAM (TEE);  Surgeon: Shlomo Wilbert SAUNDERS, MD;  Location[facility];  Service: Cardiovascular;  Laterality: N/A;     No outpatient medications have been marked as taking for the 06/07/24 encounter (Appointment) with Kandace Elrod J, MD.     Allergies:   Nifedipine, Penicillins, Statins, Hydralazine , Hydrocodone , Iodinated contrast media, Metoprolol, Oxycodone , Codeine, and Ibuprofen    Social History   Tobacco Use   Smoking status: Never   Smokeless tobacco: Never  Vaping Use   Vaping status: Never Used  Substance Use Topics   Alcohol use: No   Drug use: Never     Family Hx: The patient's family history includes Breast cancer (age of onset: 70) in her sister; Diabetes in her mother and sister; Eating disorder in her mother; Hyperlipidemia in her mother; Hypertension in her mother; Obesity in her mother.  ROS:   Please see the history of present illness.    Review of Systems  Constitutional: Negative.   HENT: Negative.    Respiratory: Negative.    Cardiovascular:  Positive for leg swelling.  Gastrointestinal: Negative.   Musculoskeletal:  Positive for joint pain.  Neurological: Negative.   Psychiatric/Behavioral: Negative.    All other systems reviewed and are negative.    Labs/Other Tests and Data Reviewed:    Recent Labs: 05/16/2024: ALT 9; BUN 32; Creatinine, Ser 6.25; Hemoglobin 11.3; Magnesium  2.1; Platelets 126; Potassium 4.0; Sodium 136   Recent Lipid Panel Lab Results  Component Value Date/Time   CHOL 191 06/25/2018 10:50 AM   CHOL 157 11/20/2013 09:54 AM   TRIG 59 05/14/2024 08:28 AM   TRIG 114 11/20/2013 09:54 AM   HDL 47 06/25/2018 10:50 AM   HDL 38 (L) 11/20/2013 09:54 AM   LDLCALC 123 (H) 06/25/2018 10:50 AM   LDLCALC 96 11/20/2013 09:54  AM    Wt Readings from Last 3 Encounters:  05/15/24 (!) 303 lb 5.7 oz (137.6 kg)  03/31/24 (!) 310 lb 6.5 oz (140.8 kg)  03/23/24 (!) 314 lb (142.4 kg)     Exam:    Vital Signs: Vital signs may also be detailed in the HPI There were no vitals taken for this visit. Constitutional:  oriented to person, place, and time. No distress.  HENT:  Head: Grossly normal Eyes:  no discharge. No scleral icterus.  Neck:  No JVD, no carotid bruits  Cardiovascular: Regular rate and rhythm, no murmurs appreciated Pulmonary/Chest: Clear to auscultation bilaterally, no wheezes or rails Abdominal: Soft.  no distension.  no tenderness.  Musculoskeletal: Normal range of motion, prosthetic left leg Neurological:  normal muscle tone. Coordination normal. No atrophy Skin: Skin warm and dry Psychiatric: normal affect, pleasant   ASSESSMENT & PLAN:    Chronic diastolic CHF (congestive heart failure) (HCC) Fluid managed by hemodialysis Also taking torsemide  100 mg twice daily daily per nephrology Echocardiogram ordered to update records  Essential hypertension Unable to tolerate calcium channel blocker secondary to leg swelling Intolerance to hydralazine  Reports blood pressure continues to run high though somewhat reasonable today after HD Reports she has not been taking her clonidine  when she gets home after HD, misses her morning dose.  Recommend she take her morning clonidine  Monday Wednesday Friday after she gets home with some food.  If blood pressure continues to run high, half dose clonidine  could be added at 1 PM daily on nondialysis days Few other options for blood pressure  Leg edema Chronic lymphedema, leg swelling stable  avoid calcium channel blockers  Chest pain of uncertain etiology Denies chest pain concerning for angina  Endocarditis February 2020 Repeat echocardiogram has been ordered to evaluate aortic valve given prior endocarditis    Signed, Evalene Lunger, MD  06/04/2024  2:42 PM    [facility] [address], [place], [place] [address]

## 2024-06-07 ENCOUNTER — Ambulatory Visit: Admitting: Cardiovascular Disease

## 2024-06-07 DIAGNOSIS — I129 Hypertensive chronic kidney disease with stage 1 through stage 4 chronic kidney disease, or unspecified chronic kidney disease: Secondary | ICD-10-CM

## 2024-06-07 DIAGNOSIS — I10 Essential (primary) hypertension: Secondary | ICD-10-CM

## 2024-06-07 DIAGNOSIS — I330 Acute and subacute infective endocarditis: Secondary | ICD-10-CM

## 2024-06-07 DIAGNOSIS — I739 Peripheral vascular disease, unspecified: Secondary | ICD-10-CM

## 2024-06-07 DIAGNOSIS — I5032 Chronic diastolic (congestive) heart failure: Secondary | ICD-10-CM

## 2024-06-07 DIAGNOSIS — N186 End stage renal disease: Secondary | ICD-10-CM

## 2024-06-07 DIAGNOSIS — E1151 Type 2 diabetes mellitus with diabetic peripheral angiopathy without gangrene: Secondary | ICD-10-CM

## 2024-06-10 ENCOUNTER — Encounter: Attending: Physician Assistant | Admitting: Physician Assistant

## 2024-06-10 DIAGNOSIS — I5042 Chronic combined systolic (congestive) and diastolic (congestive) heart failure: Secondary | ICD-10-CM | POA: Diagnosis not present

## 2024-06-10 DIAGNOSIS — T8131XA Disruption of external operation (surgical) wound, not elsewhere classified, initial encounter: Secondary | ICD-10-CM | POA: Insufficient documentation

## 2024-06-10 DIAGNOSIS — I890 Lymphedema, not elsewhere classified: Secondary | ICD-10-CM | POA: Insufficient documentation

## 2024-06-10 DIAGNOSIS — L97312 Non-pressure chronic ulcer of right ankle with fat layer exposed: Secondary | ICD-10-CM | POA: Insufficient documentation

## 2024-06-10 DIAGNOSIS — E11621 Type 2 diabetes mellitus with foot ulcer: Secondary | ICD-10-CM | POA: Insufficient documentation

## 2024-06-10 DIAGNOSIS — I132 Hypertensive heart and chronic kidney disease with heart failure and with stage 5 chronic kidney disease, or end stage renal disease: Secondary | ICD-10-CM | POA: Insufficient documentation

## 2024-06-10 DIAGNOSIS — Y838 Other surgical procedures as the cause of abnormal reaction of the patient, or of later complication, without mention of misadventure at the time of the procedure: Secondary | ICD-10-CM | POA: Insufficient documentation

## 2024-06-10 DIAGNOSIS — E1122 Type 2 diabetes mellitus with diabetic chronic kidney disease: Secondary | ICD-10-CM | POA: Insufficient documentation

## 2024-06-10 DIAGNOSIS — N186 End stage renal disease: Secondary | ICD-10-CM | POA: Diagnosis not present

## 2024-06-10 DIAGNOSIS — Z89512 Acquired absence of left leg below knee: Secondary | ICD-10-CM | POA: Insufficient documentation

## 2024-06-10 DIAGNOSIS — E1143 Type 2 diabetes mellitus with diabetic autonomic (poly)neuropathy: Secondary | ICD-10-CM | POA: Diagnosis not present

## 2024-06-14 ENCOUNTER — Encounter

## 2024-06-14 DIAGNOSIS — T8131XA Disruption of external operation (surgical) wound, not elsewhere classified, initial encounter: Secondary | ICD-10-CM | POA: Diagnosis not present

## 2024-06-17 ENCOUNTER — Encounter: Attending: Physician Assistant | Admitting: Physician Assistant

## 2024-06-17 DIAGNOSIS — Z89512 Acquired absence of left leg below knee: Secondary | ICD-10-CM | POA: Diagnosis not present

## 2024-06-17 DIAGNOSIS — I5042 Chronic combined systolic (congestive) and diastolic (congestive) heart failure: Secondary | ICD-10-CM | POA: Diagnosis not present

## 2024-06-17 DIAGNOSIS — I132 Hypertensive heart and chronic kidney disease with heart failure and with stage 5 chronic kidney disease, or end stage renal disease: Secondary | ICD-10-CM | POA: Diagnosis not present

## 2024-06-17 DIAGNOSIS — L97312 Non-pressure chronic ulcer of right ankle with fat layer exposed: Secondary | ICD-10-CM | POA: Insufficient documentation

## 2024-06-17 DIAGNOSIS — E11621 Type 2 diabetes mellitus with foot ulcer: Secondary | ICD-10-CM | POA: Insufficient documentation

## 2024-06-17 DIAGNOSIS — T8131XA Disruption of external operation (surgical) wound, not elsewhere classified, initial encounter: Secondary | ICD-10-CM | POA: Diagnosis present

## 2024-06-17 DIAGNOSIS — E1122 Type 2 diabetes mellitus with diabetic chronic kidney disease: Secondary | ICD-10-CM | POA: Diagnosis not present

## 2024-06-17 DIAGNOSIS — I890 Lymphedema, not elsewhere classified: Secondary | ICD-10-CM | POA: Insufficient documentation

## 2024-06-17 DIAGNOSIS — E1143 Type 2 diabetes mellitus with diabetic autonomic (poly)neuropathy: Secondary | ICD-10-CM | POA: Insufficient documentation

## 2024-06-17 DIAGNOSIS — Y838 Other surgical procedures as the cause of abnormal reaction of the patient, or of later complication, without mention of misadventure at the time of the procedure: Secondary | ICD-10-CM | POA: Insufficient documentation

## 2024-06-17 DIAGNOSIS — Z992 Dependence on renal dialysis: Secondary | ICD-10-CM | POA: Insufficient documentation

## 2024-06-17 DIAGNOSIS — N186 End stage renal disease: Secondary | ICD-10-CM | POA: Diagnosis not present

## 2024-06-24 ENCOUNTER — Encounter: Admitting: Physician Assistant

## 2024-06-24 DIAGNOSIS — T8131XA Disruption of external operation (surgical) wound, not elsewhere classified, initial encounter: Secondary | ICD-10-CM | POA: Diagnosis not present

## 2024-07-01 ENCOUNTER — Encounter: Admitting: Internal Medicine

## 2024-07-01 DIAGNOSIS — T8131XA Disruption of external operation (surgical) wound, not elsewhere classified, initial encounter: Secondary | ICD-10-CM | POA: Diagnosis not present

## 2024-07-08 ENCOUNTER — Encounter: Attending: Physician Assistant | Admitting: Physician Assistant

## 2024-07-08 DIAGNOSIS — E11621 Type 2 diabetes mellitus with foot ulcer: Secondary | ICD-10-CM | POA: Insufficient documentation

## 2024-07-08 DIAGNOSIS — T8131XA Disruption of external operation (surgical) wound, not elsewhere classified, initial encounter: Secondary | ICD-10-CM | POA: Insufficient documentation

## 2024-07-08 DIAGNOSIS — E1122 Type 2 diabetes mellitus with diabetic chronic kidney disease: Secondary | ICD-10-CM | POA: Insufficient documentation

## 2024-07-08 DIAGNOSIS — E1143 Type 2 diabetes mellitus with diabetic autonomic (poly)neuropathy: Secondary | ICD-10-CM | POA: Diagnosis not present

## 2024-07-08 DIAGNOSIS — Z89512 Acquired absence of left leg below knee: Secondary | ICD-10-CM | POA: Insufficient documentation

## 2024-07-08 DIAGNOSIS — I132 Hypertensive heart and chronic kidney disease with heart failure and with stage 5 chronic kidney disease, or end stage renal disease: Secondary | ICD-10-CM | POA: Insufficient documentation

## 2024-07-08 DIAGNOSIS — N186 End stage renal disease: Secondary | ICD-10-CM | POA: Diagnosis not present

## 2024-07-08 DIAGNOSIS — I5042 Chronic combined systolic (congestive) and diastolic (congestive) heart failure: Secondary | ICD-10-CM | POA: Insufficient documentation

## 2024-07-08 DIAGNOSIS — I890 Lymphedema, not elsewhere classified: Secondary | ICD-10-CM | POA: Insufficient documentation

## 2024-07-08 DIAGNOSIS — Y838 Other surgical procedures as the cause of abnormal reaction of the patient, or of later complication, without mention of misadventure at the time of the procedure: Secondary | ICD-10-CM | POA: Insufficient documentation

## 2024-07-08 DIAGNOSIS — Z992 Dependence on renal dialysis: Secondary | ICD-10-CM | POA: Diagnosis not present

## 2024-07-08 DIAGNOSIS — L97312 Non-pressure chronic ulcer of right ankle with fat layer exposed: Secondary | ICD-10-CM | POA: Diagnosis not present

## 2024-07-15 ENCOUNTER — Encounter: Admitting: Physician Assistant

## 2024-07-15 DIAGNOSIS — T8131XA Disruption of external operation (surgical) wound, not elsewhere classified, initial encounter: Secondary | ICD-10-CM | POA: Diagnosis not present

## 2024-07-22 ENCOUNTER — Encounter: Admitting: Physician Assistant

## 2024-07-22 DIAGNOSIS — T8131XA Disruption of external operation (surgical) wound, not elsewhere classified, initial encounter: Secondary | ICD-10-CM | POA: Diagnosis not present

## 2024-07-23 ENCOUNTER — Other Ambulatory Visit: Payer: Self-pay | Admitting: *Deleted

## 2024-07-23 DIAGNOSIS — D631 Anemia in chronic kidney disease: Secondary | ICD-10-CM

## 2024-07-26 ENCOUNTER — Inpatient Hospital Stay: Attending: Oncology

## 2024-07-26 DIAGNOSIS — Z803 Family history of malignant neoplasm of breast: Secondary | ICD-10-CM | POA: Diagnosis not present

## 2024-07-26 DIAGNOSIS — R7989 Other specified abnormal findings of blood chemistry: Secondary | ICD-10-CM | POA: Diagnosis not present

## 2024-07-26 DIAGNOSIS — Z992 Dependence on renal dialysis: Secondary | ICD-10-CM | POA: Insufficient documentation

## 2024-07-26 DIAGNOSIS — N186 End stage renal disease: Secondary | ICD-10-CM | POA: Diagnosis not present

## 2024-07-26 DIAGNOSIS — I120 Hypertensive chronic kidney disease with stage 5 chronic kidney disease or end stage renal disease: Secondary | ICD-10-CM | POA: Diagnosis present

## 2024-07-26 DIAGNOSIS — D631 Anemia in chronic kidney disease: Secondary | ICD-10-CM | POA: Insufficient documentation

## 2024-07-26 LAB — FERRITIN: Ferritin: 573 ng/mL — ABNORMAL HIGH (ref 11–307)

## 2024-07-26 LAB — CBC WITH DIFFERENTIAL/PLATELET
Abs Immature Granulocytes: 0.02 K/uL (ref 0.00–0.07)
Basophils Absolute: 0.1 K/uL (ref 0.0–0.1)
Basophils Relative: 1 %
Eosinophils Absolute: 0.1 K/uL (ref 0.0–0.5)
Eosinophils Relative: 2 %
HCT: 37 % (ref 36.0–46.0)
Hemoglobin: 11.6 g/dL — ABNORMAL LOW (ref 12.0–15.0)
Immature Granulocytes: 0 %
Lymphocytes Relative: 29 %
Lymphs Abs: 1.9 K/uL (ref 0.7–4.0)
MCH: 28.7 pg (ref 26.0–34.0)
MCHC: 31.4 g/dL (ref 30.0–36.0)
MCV: 91.6 fL (ref 80.0–100.0)
Monocytes Absolute: 0.5 K/uL (ref 0.1–1.0)
Monocytes Relative: 7 %
Neutro Abs: 3.9 K/uL (ref 1.7–7.7)
Neutrophils Relative %: 61 %
Platelets: 135 K/uL — ABNORMAL LOW (ref 150–400)
RBC: 4.04 MIL/uL (ref 3.87–5.11)
RDW: 14.2 % (ref 11.5–15.5)
WBC: 6.5 K/uL (ref 4.0–10.5)
nRBC: 0 % (ref 0.0–0.2)

## 2024-07-26 LAB — IRON AND TIBC
Iron: 125 ug/dL (ref 28–170)
Saturation Ratios: 67 % — ABNORMAL HIGH (ref 10.4–31.8)
TIBC: 188 ug/dL — ABNORMAL LOW (ref 250–450)
UIBC: 63 ug/dL

## 2024-07-27 ENCOUNTER — Encounter: Payer: Self-pay | Admitting: Oncology

## 2024-07-27 ENCOUNTER — Inpatient Hospital Stay (HOSPITAL_BASED_OUTPATIENT_CLINIC_OR_DEPARTMENT_OTHER): Admitting: Oncology

## 2024-07-27 ENCOUNTER — Inpatient Hospital Stay

## 2024-07-27 VITALS — BP 150/80 | HR 62 | Temp 97.8°F | Resp 18 | Ht 68.0 in | Wt 321.0 lb

## 2024-07-27 DIAGNOSIS — N189 Chronic kidney disease, unspecified: Secondary | ICD-10-CM

## 2024-07-27 DIAGNOSIS — D631 Anemia in chronic kidney disease: Secondary | ICD-10-CM | POA: Diagnosis not present

## 2024-07-27 DIAGNOSIS — I120 Hypertensive chronic kidney disease with stage 5 chronic kidney disease or end stage renal disease: Secondary | ICD-10-CM | POA: Diagnosis not present

## 2024-07-27 NOTE — Progress Notes (Signed)
No Treatment today

## 2024-07-27 NOTE — Progress Notes (Signed)
 [facility]  Telephone:([phone] Fax:([phone]) [phone]  ID: Rachel Holmes Medicine OB: [birth year]  MR#: [id number]  RDW#:[id number]  Patient Care Team: Zachary Idelia LABOR, MD as PCP - General (Family Medicine) Rachel Evalene JINNY, MD as PCP - Cardiology (Cardiology) Rachel Evalene JINNY, MD as Consulting Physician (Hematology and Oncology)  CHIEF COMPLAINT: Anemia associated with chronic renal failure.  INTERVAL HISTORY: Patient returns to clinic today for repeat laboratory work and further evaluation.  She continues to feel well and is at her baseline.  She does not complain of any weakness or fatigue.  She continues dialysis on Mondays, Wednesdays, and Fridays.  She has no neurologic complaints.  She denies any recent fevers or illnesses.  She has a good appetite and denies weight loss.  She has no chest pain, shortness of breath, cough, or hemoptysis.  She denies any nausea, vomiting, constipation, or diarrhea.  She has no melena or hematochezia.  Patient offers no specific complaints today.  REVIEW OF SYSTEMS:   Review of Systems  Constitutional: Negative.  Negative for fever, malaise/fatigue and weight loss.  Respiratory: Negative.  Negative for cough, hemoptysis and shortness of breath.   Cardiovascular: Negative.  Negative for chest pain.  Gastrointestinal: Negative.  Negative for abdominal pain, blood in stool and melena.  Genitourinary: Negative.   Musculoskeletal: Negative.  Negative for back pain.  Skin: Negative.  Negative for rash.  Neurological: Negative.  Negative for dizziness, focal weakness, weakness and headaches.  Psychiatric/Behavioral: Negative.  The patient is not nervous/anxious.     As per HPI. Otherwise, a complete review of systems is negative.  PAST MEDICAL HISTORY: Past Medical History:  Diagnosis Date   Allergic rhinitis    Allergy    Anemia    Anxiety    Aortic stenosis 09/13/2016   a.) TTE 09/13/2016: mild AS --> MPG 11.2 mmHg. b.) TTE  12/27/2020: EF 55-60%; no AS --> MPG 8 mmHg.   Aortic valve endocarditis 12/25/2018   a.) TTE 12/25/2018: 0.75 x 1 cm AV mass. b.) TEE 12/28/2018: small shaggy mobile density on aortic side of RIGHT coronary cusp consistent with vegetation.   Arthritis    Back pain, chronic    Bimalleolar fracture of right ankle 01/2024   Bradycardia    Breast mass    Patient can no longer palpate specific masses but showed tech general area of concern   Charcot's joint of ankle, left    CHF (congestive heart failure) (HCC) 07/09/2007   a.) TTE 07/09/2007: EF 50%; G1DD. b.) TTE 09/13/2016: EF 55%; G2DD. c.) TTE 12/27/2020: EF 55-60%; G2DD; GLS -15.1%.; d.) TTE 09/04/2023: EF 50-55%, G1DD, mild LAE, mild MR, mod AoV calc   Complication of anesthesia    a.) PONV. b.) Delayed emergence   Constipation    Diabetic nephropathy (HCC)    DVT (deep venous thrombosis) (HCC)    Dyspnea    ESRD (end stage renal disease) on dialysis [facility])    Family history of adverse reaction to anesthesia    a.) Sisters x 2 with (+) delayed emergence.   GERD (gastroesophageal reflux disease)    Heart murmur    HLD (hyperlipidemia)    Hyperparathyroidism    Hypertension    Legally blind in left eye, as defined in [place]     Lymphedema    Mild pulmonary hypertension (HCC) 09/13/2016   a.) TTE 09/13/2016: EF 55%; RVSP 45 mmHg. b.) TTE 12/25/2018: EF 55-60%; RVSP 35.5 mmHg.   Obesity    Onychomycosis  PONV (postoperative nausea and vomiting)    S/P BKA (below knee amputation) unilateral, left (HCC)    Sepsis (HCC) 12/2018   a.) group G streptococcal bacteremia with AV endocarditis and LLE soft tissue infection.   T2DM (type 2 diabetes mellitus) (HCC)     PAST SURGICAL HISTORY: Past Surgical History:  Procedure Laterality Date   A/V FISTULAGRAM Left 06/18/2022   Procedure: A/V Fistulagram;  Surgeon: Jama Cordella MATSU, MD;  Location: [facility] INVASIVE CV LAB;  Service: Cardiovascular;  Laterality: Left;   A/V FISTULAGRAM Left  08/13/2022   Procedure: A/V Fistulagram;  Surgeon: Jama Cordella MATSU, MD;  Location: [facility] INVASIVE CV [facility];  Service: Cardiovascular;  Laterality: Left;   ABDOMINAL HYSTERECTOMY     AMPUTATION Left 05/05/2019   Procedure: AMPUTATION BELOW KNEE;  Surgeon: Jama Cordella MATSU, MD;  Location: [facility] ORS;  Service: Vascular;  Laterality: Left;   AV FISTULA PLACEMENT Right 09/19/2021   Procedure: ARTERIOVENOUS (AV) FISTULA CREATION (BRACHIAL CEPHALIC);  Surgeon: Jama Cordella MATSU, MD;  Location: [facility] ORS;  Service: Vascular;  Laterality: Right;   AV FISTULA PLACEMENT Left 11/09/2021   Procedure: ARTERIOVENOUS (AV) FISTULA CREATION ( BRACHIAL CEPHALIC );  Surgeon: Jama Cordella MATSU, MD;  Location: [facility] ORS;  Service: Vascular;  Laterality: Left;   BREAST BIOPSY Left 2014   FNA 12:00 position - Negative   CATARACT EXTRACTION Bilateral    DIALYSIS/PERMA CATHETER INSERTION N/A 03/28/2022   Procedure: DIALYSIS/PERMA CATHETER INSERTION;  Surgeon: Marea Selinda RAMAN, MD;  Location: [facility];  Service: Cardiovascular;  Laterality: N/A;   DIALYSIS/PERMA CATHETER REMOVAL N/A 12/09/2022   Procedure: DIALYSIS/PERMA CATHETER REMOVAL;  Surgeon: Marea Selinda RAMAN, MD;  Location: [facility] CV [facility];  Service: Cardiovascular;  Laterality: N/A;   EYE SURGERY Left 2007   removed a lens, no lens implanted   IR FLUORO GUIDE CV LINE RIGHT  12/29/2018   IR REMOVAL TUN CV CATH W/O FL  02/19/2019   IR US  GUIDE VASC ACCESS RIGHT  12/29/2018   ORIF ANKLE FRACTURE Right 01/22/2024   Procedure: OPEN REDUCTION INTERNAL FIXATION (ORIF) ANKLE FRACTURE;  Surgeon: Onesimo Oneil LABOR, MD;  Location: [facility] ORS;  Service: Orthopedics;  Laterality: Right;   REVISON OF ARTERIOVENOUS FISTULA Left 10/16/2022   Procedure: REVISON OF ARTERIOVENOUS FISTULA;  Surgeon: Jama Cordella MATSU, MD;  Location: [facility] OR[facility];  Service: Vascular;  Laterality: Left;   TEE WITHOUT CARDIOVERSION N/A 12/28/2018   Procedure: TRANSESOPHAGEAL ECHOCARDIOGRAM (TEE);   Surgeon: Shlomo Wilbert SAUNDERS, MD;  Location: [facility] ENDOSCOPY;  Service: Cardiovascular;  Laterality: N/A;    FAMILY HISTORY: Family History  Problem Relation Age of Onset   Breast cancer Sister 40   Diabetes Sister    Diabetes Mother    Hypertension Mother    Hyperlipidemia Mother    Eating disorder Mother    Obesity Mother     ADVANCED DIRECTIVES (Y/N):  N  HEALTH MAINTENANCE: Social History   Tobacco Use   Smoking status: Never   Smokeless tobacco: Never  Vaping Use   Vaping status: Never Used  Substance Use Topics   Alcohol use: No   Drug use: Never     Colonoscopy:  PAP:  Bone density:  Lipid panel:  Allergies  Allergen Reactions   Nifedipine Rash   Penicillins Hives, Shortness Of Breath and Swelling   Statins Shortness Of Breath    Wheezing   Hydralazine  Itching   Hydrocodone  Itching and Nausea Only   Iodinated Contrast Media Other (See Comments)  ESRD   Metoprolol Diarrhea and Other (See Comments)    Pt states make my heart race   Oxycodone  Nausea Only   Codeine Nausea Only   Ibuprofen  Other (See Comments)    Raises blood pressure    Current Outpatient Medications  Medication Sig Dispense Refill   acetaminophen  (TYLENOL ) 500 MG tablet Take 2 tablets (1,000 mg total) by mouth every 6 (six) hours as needed. 100 tablet 2   albuterol  (PROVENTIL  HFA;VENTOLIN  HFA) 108 (90 Base) MCG/ACT inhaler Inhale 1-2 puffs into the lungs every 6 (six) hours as needed for wheezing or shortness of breath.     aspirin  EC 81 MG tablet Take 81 mg by mouth daily. Swallow whole.     calcitRIOL  (ROCALTROL ) 0.25 MCG capsule Take 0.25 mcg by mouth every Monday, Wednesday, and Friday with hemodialysis.     clobetasol cream (TEMOVATE) 0.05 % Apply 1 application  topically 2 (two) times daily as needed (rash).     cloNIDine  (CATAPRES ) 0.3 MG tablet Take 1 tablet (0.3 mg total) by mouth 2 (two) times daily. 180 tablet 3   collagenase  (SANTYL ) 250 UNIT/GM ointment Apply topically daily.  Wound to lateral ankle is 8 cm by 4 cm 30 g 1   Continuous Blood Gluc Receiver (FREESTYLE LIBRE 14 DAY READER) DEVI USE AS DIRECTED 1 each 0   docusate sodium  (COLACE) 100 MG capsule Take 1 capsule (100 mg total) by mouth 2 (two) times daily. 10 capsule 0   doxazosin  (CARDURA ) 8 MG tablet Take 8 mg by mouth daily.     DULoxetine  (CYMBALTA ) 30 MG capsule Take 30 mg by mouth at bedtime.     ezetimibe  (ZETIA ) 10 MG tablet Take 1 tablet (10 mg total) by mouth daily. 90 tablet 3   gabapentin  (NEURONTIN ) 600 MG tablet Take 600 mg by mouth at bedtime.     glucosamine-chondroitin 500-400 MG tablet Take 1 tablet by mouth in the morning and at bedtime.     hydrOXYzine  (ATARAX ) 25 MG tablet Take 25 mg by mouth every 6 (six) hours as needed for itching.     insulin  degludec (TRESIBA ) 100 UNIT/ML FlexTouch Pen Inject 10 Units into the skin.  Inject 10 Units subcutaneously at bedtime     isosorbide  mononitrate (IMDUR ) 120 MG 24 hr tablet Take 1 tablet (120 mg total) by mouth in the morning and at bedtime.     Lancets (ONETOUCH ULTRASOFT) lancets Used to check blood sugars four times daily. 200 each 12   LYUMJEV  KWIKPEN 100 UNIT/ML KwikPen Inject 10-20 Units into the skin 3 (three) times daily before meals. With meals     Magnesium  400 MG CAPS Take 400 mg by mouth at bedtime.     Multiple Vitamin (MULTIVITAMIN WITH MINERALS) TABS tablet Take 1 tablet by mouth daily.     olmesartan (BENICAR) 40 MG tablet Take 40 mg by mouth daily.     omeprazole (PRILOSEC) 40 MG capsule Take 40 mg by mouth 2 (two) times daily with breakfast and lunch.     ondansetron  (ZOFRAN ) 4 MG tablet TAKE 1 TABLET BY MOUTH EVERY 8 HOURS AS NEEDED FOR UP TO 14 DAYS FOR NAUSEA OR VOMITING     ONETOUCH VERIO test strip USE TO CHECK BLOOD SUGAR TWICE DAILY 100 each 0   torsemide  (DEMADEX ) 100 MG tablet Take 1 tablet (100 mg total) by mouth 2 (two) times daily.     vitamin B-12 (CYANOCOBALAMIN ) 1000 MCG tablet Take 1,000 mcg by mouth daily.  lactulose  (CHRONULAC ) 10 GM/15ML solution Take 15 mLs by mouth daily as needed for moderate constipation. (Patient not taking: Reported on 07/27/2024)     No current facility-administered medications for this visit.    OBJECTIVE: Vitals:   07/27/24 1430  BP: (!) 150/80  Pulse: 62  Resp: 18  Temp: 97.8 F (36.6 C)  SpO2: 100%     Body mass index is 48.81 kg/m.    ECOG FS:0 - Asymptomatic  General: Well-developed, well-nourished, no acute distress.  Sitting in a wheelchair. Eyes: Pink conjunctiva, anicteric sclera. HEENT: Normocephalic, moist mucous membranes. Lungs: No audible wheezing or coughing. Heart: Regular rate and rhythm. Abdomen: Soft, nontender, no obvious distention. Musculoskeletal: No edema, cyanosis, or clubbing. Neuro: Alert, answering all questions appropriately. Cranial nerves grossly intact. Skin: No rashes or petechiae noted. Psych: Normal affect.  LAB RESULTS:  Lab Results  Component Value Date   NA 136 05/16/2024   K 4.0 05/16/2024   CL 100 05/16/2024   CO2 27 05/16/2024   GLUCOSE 162 (H) 05/16/2024   BUN 32 (H) 05/16/2024   CREATININE 6.25 (H) 05/16/2024   CALCIUM 8.5 (L) 05/16/2024   PROT 7.1 05/16/2024   ALBUMIN  2.9 (L) 05/16/2024   AST 13 (L) 05/16/2024   ALT 9 05/16/2024   ALKPHOS 82 05/16/2024   BILITOT 0.6 05/16/2024   GFRNONAA 7 (L) 05/16/2024   GFRAA 17 (L) 05/28/2019    Lab Results  Component Value Date   WBC 6.5 07/26/2024   NEUTROABS 3.9 07/26/2024   HGB 11.6 (L) 07/26/2024   HCT 37.0 07/26/2024   MCV 91.6 07/26/2024   PLT 135 (L) 07/26/2024   Lab Results  Component Value Date   IRON 125 07/26/2024   TIBC 188 (L) 07/26/2024   IRONPCTSAT 67 (H) 07/26/2024   Lab Results  Component Value Date   FERRITIN 573 (H) 07/26/2024     STUDIES: No results found.   ASSESSMENT: Anemia associated with chronic renal failure.  PLAN:    Anemia associated with chronic renal failure: Patient's hemoglobin is only mildly  decreased, but essentially stable at 11.6.  She has an elevated ferritin as well as iron saturation level which may be related to ongoing dialysis.  She does not require IV iron or Retacrit  at this time.  After discussion with the patient, is agreed upon and no further follow-up is necessary.  Please refer patient back if her hemoglobin decreases and remains persistently below 10.0.   Chronic renal failure: Continue dialysis as per nephrology on Mondays, Wednesdays, and Fridays. Hypertension: Patient's blood pressure is moderately elevated today.  Continue monitoring and treatment per primary care/nephrology. Elevated ferritin: Possibly secondary to dialysis/acute phase reactant.   Patient expressed understanding and was in agreement with this plan. She also understands that She can call clinic at any time with any questions, concerns, or complaints.     Evalene Holmes Reusing, MD   07/27/2024 2:40 PM

## 2024-07-29 ENCOUNTER — Encounter: Admitting: Physician Assistant

## 2024-07-29 DIAGNOSIS — T8131XA Disruption of external operation (surgical) wound, not elsewhere classified, initial encounter: Secondary | ICD-10-CM | POA: Diagnosis not present

## 2024-08-05 ENCOUNTER — Encounter: Attending: Physician Assistant | Admitting: Physician Assistant

## 2024-08-05 DIAGNOSIS — E1122 Type 2 diabetes mellitus with diabetic chronic kidney disease: Secondary | ICD-10-CM | POA: Insufficient documentation

## 2024-08-05 DIAGNOSIS — N186 End stage renal disease: Secondary | ICD-10-CM | POA: Insufficient documentation

## 2024-08-05 DIAGNOSIS — T8131XA Disruption of external operation (surgical) wound, not elsewhere classified, initial encounter: Secondary | ICD-10-CM | POA: Diagnosis present

## 2024-08-05 DIAGNOSIS — Z89512 Acquired absence of left leg below knee: Secondary | ICD-10-CM | POA: Insufficient documentation

## 2024-08-05 DIAGNOSIS — E1143 Type 2 diabetes mellitus with diabetic autonomic (poly)neuropathy: Secondary | ICD-10-CM | POA: Diagnosis not present

## 2024-08-05 DIAGNOSIS — Y839 Surgical procedure, unspecified as the cause of abnormal reaction of the patient, or of later complication, without mention of misadventure at the time of the procedure: Secondary | ICD-10-CM | POA: Insufficient documentation

## 2024-08-05 DIAGNOSIS — I890 Lymphedema, not elsewhere classified: Secondary | ICD-10-CM | POA: Diagnosis not present

## 2024-08-05 DIAGNOSIS — Z992 Dependence on renal dialysis: Secondary | ICD-10-CM | POA: Insufficient documentation

## 2024-08-05 DIAGNOSIS — I132 Hypertensive heart and chronic kidney disease with heart failure and with stage 5 chronic kidney disease, or end stage renal disease: Secondary | ICD-10-CM | POA: Insufficient documentation

## 2024-08-05 DIAGNOSIS — E11621 Type 2 diabetes mellitus with foot ulcer: Secondary | ICD-10-CM | POA: Insufficient documentation

## 2024-08-05 DIAGNOSIS — I5042 Chronic combined systolic (congestive) and diastolic (congestive) heart failure: Secondary | ICD-10-CM | POA: Insufficient documentation

## 2024-08-05 DIAGNOSIS — L97312 Non-pressure chronic ulcer of right ankle with fat layer exposed: Secondary | ICD-10-CM | POA: Diagnosis not present

## 2024-08-12 ENCOUNTER — Encounter: Admitting: Physician Assistant

## 2024-08-12 DIAGNOSIS — T8131XA Disruption of external operation (surgical) wound, not elsewhere classified, initial encounter: Secondary | ICD-10-CM | POA: Diagnosis not present

## 2024-08-19 ENCOUNTER — Ambulatory Visit: Admitting: Physician Assistant

## 2024-08-26 ENCOUNTER — Ambulatory Visit: Admitting: Physician Assistant

## 2024-08-31 ENCOUNTER — Other Ambulatory Visit: Payer: Self-pay

## 2024-08-31 DIAGNOSIS — E1151 Type 2 diabetes mellitus with diabetic peripheral angiopathy without gangrene: Secondary | ICD-10-CM

## 2024-09-06 ENCOUNTER — Encounter: Payer: Self-pay | Admitting: Radiology

## 2024-09-09 ENCOUNTER — Ambulatory Visit
Admission: RE | Admit: 2024-09-09 | Discharge: 2024-09-09 | Disposition: A | Discharge status: Home / Self Care | Source: Ambulatory Visit

## 2024-09-09 DIAGNOSIS — E1151 Type 2 diabetes mellitus with diabetic peripheral angiopathy without gangrene: Secondary | ICD-10-CM | POA: Diagnosis present
